# Patient Record
Sex: Male | Born: 1965 | Race: White | Hispanic: No | Smoking: Former smoker
Health system: Southern US, Community
[De-identification: ages and names within clinical notes are randomized; demographics above are authoritative.]

## PROBLEM LIST (undated history)

## (undated) DIAGNOSIS — F32A Depression, unspecified: Secondary | ICD-10-CM

## (undated) DIAGNOSIS — K759 Inflammatory liver disease, unspecified: Secondary | ICD-10-CM

## (undated) DIAGNOSIS — F319 Bipolar disorder, unspecified: Secondary | ICD-10-CM

## (undated) DIAGNOSIS — A4902 Methicillin resistant Staphylococcus aureus infection, unspecified site: Secondary | ICD-10-CM

## (undated) DIAGNOSIS — F329 Major depressive disorder, single episode, unspecified: Secondary | ICD-10-CM

## (undated) DIAGNOSIS — F419 Anxiety disorder, unspecified: Secondary | ICD-10-CM

## (undated) DIAGNOSIS — I10 Essential (primary) hypertension: Secondary | ICD-10-CM

## (undated) HISTORY — DX: Depression, unspecified: F32.A

## (undated) HISTORY — DX: Inflammatory liver disease, unspecified: K75.9

---
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
## 2013-04-22 DIAGNOSIS — F319 Bipolar disorder, unspecified: Secondary | ICD-10-CM

## 2014-03-09 ENCOUNTER — Emergency Department: Payer: Self-pay | Admitting: Internal Medicine

## 2014-03-09 LAB — CBC
HCT: 51.8 % (ref 40.0–52.0)
HGB: 17.2 g/dL (ref 13.0–18.0)
MCH: 29.4 pg (ref 26.0–34.0)
MCHC: 33.2 g/dL (ref 32.0–36.0)
MCV: 89 fL (ref 80–100)
PLATELETS: 218 10*3/uL (ref 150–440)
RBC: 5.84 10*6/uL (ref 4.40–5.90)
RDW: 13.4 % (ref 11.5–14.5)
WBC: 10.2 10*3/uL (ref 3.8–10.6)

## 2014-03-09 LAB — TROPONIN I

## 2014-03-09 LAB — BASIC METABOLIC PANEL
Anion Gap: 8 (ref 7–16)
BUN: 6 mg/dL — AB (ref 7–18)
CO2: 26 mmol/L (ref 21–32)
Calcium, Total: 9.5 mg/dL (ref 8.5–10.1)
Chloride: 103 mmol/L (ref 98–107)
Creatinine: 1.1 mg/dL (ref 0.60–1.30)
EGFR (African American): >60
Glucose: 109 mg/dL — ABNORMAL HIGH (ref 65–99)
Osmolality: 272 (ref 275–301)
Potassium: 3.7 mmol/L (ref 3.5–5.1)
SODIUM: 137 mmol/L (ref 136–145)

## 2014-10-13 ENCOUNTER — Emergency Department: Payer: Self-pay | Admitting: Emergency Medicine

## 2014-10-15 ENCOUNTER — Emergency Department: Payer: Self-pay | Admitting: Emergency Medicine

## 2014-10-15 LAB — CBC
HCT: 42.4 % (ref 40.0–52.0)
HGB: 14.3 g/dL (ref 13.0–18.0)
MCH: 30.3 pg (ref 26.0–34.0)
MCHC: 33.8 g/dL (ref 32.0–36.0)
MCV: 90 fL (ref 80–100)
PLATELETS: 216 10*3/uL (ref 150–440)
RBC: 4.73 10*6/uL (ref 4.40–5.90)
RDW: 13 % (ref 11.5–14.5)
WBC: 6.8 10*3/uL (ref 3.8–10.6)

## 2014-10-15 LAB — COMPREHENSIVE METABOLIC PANEL
ALK PHOS: 69 U/L
ALT: 34 U/L
ANION GAP: 7 (ref 7–16)
Albumin: 3.6 g/dL (ref 3.4–5.0)
BILIRUBIN TOTAL: 0.2 mg/dL (ref 0.2–1.0)
BUN: 16 mg/dL (ref 7–18)
CO2: 23 mmol/L (ref 21–32)
CREATININE: 1.24 mg/dL (ref 0.60–1.30)
Calcium, Total: 8.7 mg/dL (ref 8.5–10.1)
Chloride: 107 mmol/L (ref 98–107)
EGFR (Non-African Amer.): >60
Glucose: 81 mg/dL (ref 65–99)
Osmolality: 274 (ref 275–301)
Potassium: 4.2 mmol/L (ref 3.5–5.1)
SGOT(AST): 28 U/L (ref 15–37)
Sodium: 137 mmol/L (ref 136–145)
TOTAL PROTEIN: 7.2 g/dL (ref 6.4–8.2)

## 2014-10-16 ENCOUNTER — Emergency Department: Payer: Self-pay | Admitting: Emergency Medicine

## 2014-10-16 LAB — COMPREHENSIVE METABOLIC PANEL
ALT: 42 U/L
AST: 36 U/L (ref 15–37)
Albumin: 4.1 g/dL (ref 3.4–5.0)
Alkaline Phosphatase: 75 U/L
Anion Gap: 6 — ABNORMAL LOW (ref 7–16)
BUN: 14 mg/dL (ref 7–18)
Bilirubin,Total: 0.3 mg/dL (ref 0.2–1.0)
CHLORIDE: 105 mmol/L (ref 98–107)
CO2: 27 mmol/L (ref 21–32)
Calcium, Total: 9.6 mg/dL (ref 8.5–10.1)
Creatinine: 1.39 mg/dL — ABNORMAL HIGH (ref 0.60–1.30)
EGFR (Non-African Amer.): 57 — ABNORMAL LOW
GLUCOSE: 72 mg/dL (ref 65–99)
Osmolality: 275 (ref 275–301)
Potassium: 4.2 mmol/L (ref 3.5–5.1)
Sodium: 138 mmol/L (ref 136–145)
Total Protein: 8 g/dL (ref 6.4–8.2)

## 2014-10-16 LAB — CBC
HCT: 48.5 % (ref 40.0–52.0)
HGB: 16 g/dL (ref 13.0–18.0)
MCH: 30.1 pg (ref 26.0–34.0)
MCHC: 33 g/dL (ref 32.0–36.0)
MCV: 91 fL (ref 80–100)
Platelet: 222 10*3/uL (ref 150–440)
RBC: 5.32 10*6/uL (ref 4.40–5.90)
RDW: 13.2 % (ref 11.5–14.5)
WBC: 6.1 10*3/uL (ref 3.8–10.6)

## 2014-10-16 LAB — LITHIUM LEVEL: Lithium: 0.56 mmol/L — ABNORMAL LOW

## 2014-10-19 LAB — WOUND CULTURE

## 2014-10-21 LAB — CULTURE, BLOOD (SINGLE)

## 2014-12-03 ENCOUNTER — Ambulatory Visit: Payer: Self-pay

## 2014-12-09 ENCOUNTER — Ambulatory Visit: Payer: Self-pay

## 2016-01-01 ENCOUNTER — Other Ambulatory Visit: Payer: Self-pay | Admitting: Internal Medicine

## 2016-01-01 DIAGNOSIS — B182 Chronic viral hepatitis C: Secondary | ICD-10-CM

## 2016-01-08 ENCOUNTER — Ambulatory Visit: Payer: Medicaid Other

## 2016-01-13 ENCOUNTER — Ambulatory Visit: Payer: Medicaid Other

## 2016-01-19 ENCOUNTER — Ambulatory Visit
Admission: RE | Admit: 2016-01-19 | Discharge: 2016-01-19 | Disposition: A | Discharge status: Home / Self Care | Payer: Medicaid Other | Source: Ambulatory Visit | Attending: Internal Medicine | Admitting: Internal Medicine

## 2016-01-19 DIAGNOSIS — B182 Chronic viral hepatitis C: Secondary | ICD-10-CM

## 2016-02-05 ENCOUNTER — Emergency Department
Admission: EM | Admit: 2016-02-05 | Discharge: 2016-02-05 | Disposition: A | Discharge status: Home / Self Care | Payer: Medicaid Other | Attending: Student | Admitting: Student

## 2016-02-05 ENCOUNTER — Encounter: Payer: Self-pay | Admitting: *Deleted

## 2016-02-05 ENCOUNTER — Emergency Department: Payer: Medicaid Other

## 2016-02-05 DIAGNOSIS — I10 Essential (primary) hypertension: Secondary | ICD-10-CM | POA: Insufficient documentation

## 2016-02-05 DIAGNOSIS — Z87891 Personal history of nicotine dependence: Secondary | ICD-10-CM | POA: Insufficient documentation

## 2016-02-05 DIAGNOSIS — F319 Bipolar disorder, unspecified: Secondary | ICD-10-CM | POA: Insufficient documentation

## 2016-02-05 DIAGNOSIS — L03116 Cellulitis of left lower limb: Secondary | ICD-10-CM

## 2016-02-05 HISTORY — DX: Essential (primary) hypertension: I10

## 2016-02-05 HISTORY — DX: Bipolar disorder, unspecified: F31.9

## 2016-02-05 MED ORDER — SULFAMETHOXAZOLE-TRIMETHOPRIM 800-160 MG PO TABS
1.0000 | ORAL_TABLET | Freq: Once | ORAL | Status: AC
  Administered 2016-02-05: 1 via ORAL
  Filled 2016-02-05: qty 1

## 2016-02-05 MED ORDER — SULFAMETHOXAZOLE-TRIMETHOPRIM 800-160 MG PO TABS: 1.0000 | ORAL_TABLET | Freq: Two times a day (BID) | ORAL | Status: DC

## 2016-02-05 MED ORDER — HYDROMORPHONE HCL 1 MG/ML IJ SOLN
1.0000 mg | Freq: Once | INTRAMUSCULAR | Status: AC
  Administered 2016-02-05: 1 mg via INTRAMUSCULAR
  Filled 2016-02-05: qty 1

## 2016-02-05 MED ORDER — OXYCODONE-ACETAMINOPHEN 7.5-325 MG PO TABS: 1.0000 | ORAL_TABLET | ORAL | Status: DC | PRN

## 2016-02-05 NOTE — ED Notes (Signed)
Left foot brusied and red, swollen in ankle area

## 2016-02-05 NOTE — ED Provider Notes (Signed)
[facility] Emergency Department Provider Note   ____________________________________________  Time seen: Approximately 10:37 AM  I have reviewed the triage vital signs and the nursing notes.   HISTORY  Chief Complaint Foot Pain    HPI Ernest Romero is a 50 y.o. male patient complaining of lateral foot pain secondary to dropping a cinderblock on his foot 3 days ago. Patient states that he was to ambulate. Patient does increase bruising and redness to the ankle. Patient also stated this increased swelling. No palliative measures taken for this complaint. Patient rates his pain as 8/10. Patient described a pain as sharp.   Past Medical History  Diagnosis Date  . Bipolar 1 disorder (HCC)   . Hypertension     There are no active problems to display for this patient.   History reviewed. No pertinent past surgical history.  Current Outpatient Rx  Name  Route  Sig  Dispense  Refill  . oxyCODONE-acetaminophen (PERCOCET) 7.5-325 MG tablet   Oral   Take 1 tablet by mouth every 4 (four) hours as needed for severe pain.   20 tablet   0   . sulfamethoxazole-trimethoprim (BACTRIM DS,SEPTRA DS) 800-160 MG tablet   Oral   Take 1 tablet by mouth 2 (two) times daily.   20 tablet   0     Allergies Review of patient's allergies indicates no known allergies.  History reviewed. No pertinent family history.  Social History Social History  Substance Use Topics  . Smoking status: Current Every Day Smoker  . Smokeless tobacco: None  . Alcohol Use: None    Review of Systems Constitutional: No fever/chills Eyes: No visual changes. ENT: No sore throat. Cardiovascular: Denies chest pain. Respiratory: Denies shortness of breath. Gastrointestinal: No abdominal pain.  No nausea, no vomiting.  No diarrhea.  No constipation. Genitourinary: Negative for dysuria. Musculoskeletal: Left foot pain Skin: Negative for rash.Redness and swelling  Neurological:  Negative for headaches, focal weakness or numbness. Psychiatric:Bipolar Endocrine:Hypertension ____________________________________________   PHYSICAL EXAM:  VITAL SIGNS: ED Triage Vitals  Enc Vitals Group     BP 02/05/16 1024 121/85 mmHg     Pulse Rate 02/05/16 1024 79     Resp 02/05/16 1024 16     Temp 02/05/16 1024 98.5 F (36.9 C)     Temp Source 02/05/16 1024 Oral     SpO2 02/05/16 1024 99 %     Weight 02/05/16 1024 200 lb (90.719 kg)     Height 02/05/16 1024 5\' 10"  (1.778 m)     Head Cir --      Peak Flow --      Pain Score 02/05/16 1025 8     Pain Loc --      Pain Edu? --      Excl. in GC? --     Constitutional: Alert and oriented. Well appearing and in no acute distress. Eyes: Conjunctivae are normal. PERRL. EOMI. Head: Atraumatic. Nose: No congestion/rhinnorhea. Mouth/Throat: Mucous membranes are moist.  Oropharynx non-erythematous. Neck: No stridor.  No cervical spine tenderness to palpation. Hematological/Lymphatic/Immunilogical: No cervical lymphadenopathy. Cardiovascular: Normal rate, regular rhythm. Grossly normal heart sounds.  Good peripheral circulation. Respiratory: Normal respiratory effort.  No retractions. Lungs CTAB. Gastrointestinal: Soft and nontender. No distention. No abdominal bruits. No CVA tenderness. Musculoskeletal:Lateral edema and erythema to the left foot.  Neurologic:  Normal speech and language. No gross focal neurologic deficits are appreciated. No gait instability. Skin:  Skin is warm, dry and intact. No rash noted.  Ecchymosis and abrasions. Psychiatric: Mood and affect are normal. Speech and behavior are normal.  ____________________________________________   LABS (all labs ordered are listed, but only abnormal results are displayed)  Labs Reviewed - No data to  display ____________________________________________  EKG   ____________________________________________  RADIOLOGY   ____________________________________________   PROCEDURES  Procedure(s) performed: None  Critical Care performed: No  ____________________________________________   INITIAL IMPRESSION / ASSESSMENT AND PLAN / ED COURSE  Pertinent labs & imaging results that were available during my care of the patient were reviewed by me and considered in my medical decision making (see chart for details).  Cellulitis left foot. Contusion left foot Discussed  negative x-ray finding with patient. Patient given discharge Instructions. Patient get a prescription for Bactrim DS and Percocets. Patient advised follow-up family doctor one week. Return by ER if condition worsens. FINAL CLINICAL IMPRESSION(S) / ED DIAGNOSES  Final diagnoses:  Cellulitis of left foot      NEW MEDICATIONS STARTED DURING THIS VISIT:  New Prescriptions   OXYCODONE-ACETAMINOPHEN (PERCOCET) 7.5-325 MG TABLET    Take 1 tablet by mouth every 4 (four) hours as needed for severe pain.   SULFAMETHOXAZOLE-TRIMETHOPRIM (BACTRIM DS,SEPTRA DS) 800-160 MG TABLET    Take 1 tablet by mouth 2 (two) times daily.     Note:  This document was prepared using Dragon voice recognition software and may include unintentional dictation errors.    Joni Reining, PA-C 02/05/16 1201  Gayla Doss, MD 02/05/16 [phone]

## 2016-02-05 NOTE — Discharge Instructions (Signed)
Cellulitis °Cellulitis is an infection of the skin and the tissue under the skin. The infected area is usually red and tender. This happens most often in the arms and lower legs. °HOME CARE  °· Take your antibiotic medicine as told. Finish the medicine even if you start to feel better. °· Keep the infected arm or leg raised (elevated). °· Put a warm cloth on the area up to 4 times per day. °· Only take medicines as told by your doctor. °· Keep all doctor visits as told. °GET HELP IF: °· You see red streaks on the skin coming from the infected area. °· Your red area gets bigger or turns a dark color. °· Your bone or joint under the infected area is painful after the skin heals. °· Your infection comes back in the same area or different area. °· You have a puffy (swollen) bump in the infected area. °· You have new symptoms. °· You have a fever. °GET HELP RIGHT AWAY IF:  °· You feel very sleepy. °· You throw up (vomit) or have watery poop (diarrhea). °· You feel sick and have muscle aches and pains. °  °This information is not intended to replace advice given to you by your health care provider. Make sure you discuss any questions you have with your health care provider. °  °Document Released: 03/07/2008 Document Revised: 06/10/2015 Document Reviewed: 12/05/2011 °Elsevier Interactive Patient Education ©2016 Elsevier Inc. ° °

## 2016-02-05 NOTE — ED Notes (Signed)
Patient to ER for c/o left foot pain after dropping cinder block on foot 3 days ago. States he is able to walk on foot.

## 2016-02-07 ENCOUNTER — Emergency Department
Admission: EM | Admit: 2016-02-07 | Discharge: 2016-02-07 | Disposition: A | Discharge status: Home / Self Care | Payer: Medicaid Other | Attending: Emergency Medicine | Admitting: Emergency Medicine

## 2016-02-07 ENCOUNTER — Emergency Department: Payer: Medicaid Other

## 2016-02-07 ENCOUNTER — Encounter: Payer: Self-pay | Admitting: *Deleted

## 2016-02-07 DIAGNOSIS — I10 Essential (primary) hypertension: Secondary | ICD-10-CM | POA: Insufficient documentation

## 2016-02-07 DIAGNOSIS — L02612 Cutaneous abscess of left foot: Secondary | ICD-10-CM | POA: Diagnosis not present

## 2016-02-07 DIAGNOSIS — L03116 Cellulitis of left lower limb: Secondary | ICD-10-CM

## 2016-02-07 DIAGNOSIS — M79672 Pain in left foot: Secondary | ICD-10-CM | POA: Diagnosis present

## 2016-02-07 DIAGNOSIS — F319 Bipolar disorder, unspecified: Secondary | ICD-10-CM | POA: Insufficient documentation

## 2016-02-07 DIAGNOSIS — Z87891 Personal history of nicotine dependence: Secondary | ICD-10-CM | POA: Diagnosis not present

## 2016-02-07 LAB — BASIC METABOLIC PANEL
Anion gap: 7 (ref 5–15)
BUN: 11 mg/dL (ref 6–20)
CALCIUM: 9.4 mg/dL (ref 8.9–10.3)
CO2: 24 mmol/L (ref 22–32)
Chloride: 106 mmol/L (ref 101–111)
Creatinine, Ser: 1.01 mg/dL (ref 0.61–1.24)
GFR calc Af Amer: >60 mL/min (ref >60)
GLUCOSE: 100 mg/dL — AB (ref 65–99)
Potassium: 4.3 mmol/L (ref 3.5–5.1)
SODIUM: 137 mmol/L (ref 135–145)

## 2016-02-07 LAB — CBC
HCT: 41.3 % (ref 40.0–52.0)
Hemoglobin: 14.2 g/dL (ref 13.0–18.0)
MCH: 30 pg (ref 26.0–34.0)
MCHC: 34.4 g/dL (ref 32.0–36.0)
MCV: 87.2 fL (ref 80.0–100.0)
Platelets: 261 10*3/uL (ref 150–440)
RBC: 4.74 MIL/uL (ref 4.40–5.90)
RDW: 12.7 % (ref 11.5–14.5)
WBC: 8.9 10*3/uL (ref 3.8–10.6)

## 2016-02-07 MED ORDER — LIDOCAINE-EPINEPHRINE (PF) 1 %-1:200000 IJ SOLN
INTRAMUSCULAR | Status: AC
  Filled 2016-02-07: qty 30

## 2016-02-07 MED ORDER — ONDANSETRON 4 MG PO TBDP: 4.0000 mg | ORAL_TABLET | Freq: Three times a day (TID) | ORAL | Status: DC | PRN

## 2016-02-07 MED ORDER — SODIUM CHLORIDE 0.9 % IV BOLUS (SEPSIS)
1000.0000 mL | Freq: Once | INTRAVENOUS | Status: AC
  Administered 2016-02-07: 1000 mL via INTRAVENOUS

## 2016-02-07 MED ORDER — CLINDAMYCIN PHOSPHATE 600 MG/50ML IV SOLN
600.0000 mg | Freq: Once | INTRAVENOUS | Status: AC
  Administered 2016-02-07: 600 mg via INTRAVENOUS
  Filled 2016-02-07 (×2): qty 50

## 2016-02-07 MED ORDER — ACETAMINOPHEN 325 MG PO TABS: 650.0000 mg | ORAL_TABLET | Freq: Four times a day (QID) | ORAL | Status: DC | PRN

## 2016-02-07 MED ORDER — LIDOCAINE-EPINEPHRINE (PF) 1 %-1:200000 IJ SOLN
30.0000 mL | Freq: Once | INTRAMUSCULAR | Status: AC
  Administered 2016-02-07: 30 mL via INTRADERMAL
  Filled 2016-02-07: qty 30

## 2016-02-07 MED ORDER — CLINDAMYCIN HCL 150 MG PO CAPS: 450.0000 mg | ORAL_CAPSULE | Freq: Three times a day (TID) | ORAL | Status: DC

## 2016-02-07 MED ORDER — IBUPROFEN 200 MG PO TABS: 600.0000 mg | ORAL_TABLET | Freq: Four times a day (QID) | ORAL | Status: DC | PRN

## 2016-02-07 NOTE — ED Provider Notes (Signed)
[facility] Emergency Department Provider Note  ____________________________________________  Time seen: 3:15 PM  I have reviewed the triage vital signs and the nursing notes.   HISTORY  Chief Complaint Wound Check    HPI Ernest Romero is a 50 y.o. male who complains of worsening swelling and pain of his left foot. 5 or 6 days ago he dropped a heavy block on the foot, which resulted in redness and swelling of the foot. He was seen in the emergency department 2 days ago diagnosed with cellulitis and started on Bactrim. However, despite taking the antibiotics, the foot has become increasingly swollen and red. He is also noticeably looks like a blister has formed on the medial aspect of the left foot. Denies any other complaints such as leg pain fevers chills abdominal pain and body aches and fatigue dizziness syncope or vomiting. He otherwise feels very well. Denies any history of diabetes.     Past Medical History  Diagnosis Date  . Bipolar 1 disorder (HCC)   . Hypertension      There are no active problems to display for this patient.    History reviewed. No pertinent past surgical history.   Current Outpatient Rx  Name  Route  Sig  Dispense  Refill  . acetaminophen (TYLENOL) 325 MG tablet   Oral   Take 2 tablets (650 mg total) by mouth every 6 (six) hours as needed.   60 tablet   0   . clindamycin (CLEOCIN) 150 MG capsule   Oral   Take 3 capsules (450 mg total) by mouth 3 (three) times daily.   90 capsule   0   . ibuprofen (MOTRIN IB) 200 MG tablet   Oral   Take 3 tablets (600 mg total) by mouth every 6 (six) hours as needed.   60 tablet   0   . ondansetron (ZOFRAN ODT) 4 MG disintegrating tablet   Oral   Take 1 tablet (4 mg total) by mouth every 8 (eight) hours as needed for nausea or vomiting.   20 tablet   0   . oxyCODONE-acetaminophen (PERCOCET) 7.5-325 MG tablet   Oral   Take 1 tablet by mouth every 4 (four) hours as needed  for severe pain.   20 tablet   0   . sulfamethoxazole-trimethoprim (BACTRIM DS,SEPTRA DS) 800-160 MG tablet   Oral   Take 1 tablet by mouth 2 (two) times daily.   20 tablet   0      Allergies Review of patient's allergies indicates no known allergies.   History reviewed. No pertinent family history.  Social History Social History  Substance Use Topics  . Smoking status: Former Games developermoker  . Smokeless tobacco: None  . Alcohol Use: No    Review of Systems  Constitutional:   No fever or chills.  Eyes:   No vision changes.  ENT:   No sore throat. No rhinorrhea. Cardiovascular:   No chest pain. Respiratory:   No dyspnea or cough. Gastrointestinal:   Negative for abdominal pain, vomiting and diarrhea.  Genitourinary:   Negative for dysuria or difficulty urinating. Musculoskeletal:   Worsening left foot swelling redness and pain as above Neurological:   Negative for headaches 10-point ROS otherwise negative.  ____________________________________________   PHYSICAL EXAM:  VITAL SIGNS: ED Triage Vitals  Enc Vitals Group     BP 02/07/16 1338 115/74 mmHg     Pulse Rate 02/07/16 1338 69     Resp 02/07/16 1338 16  Temp 02/07/16 1338 98.5 F (36.9 C)     Temp Source 02/07/16 1338 Oral     SpO2 02/07/16 1338 96 %     Weight 02/07/16 1338 200 lb (90.719 kg)     Height 02/07/16 1338  (1.778 m)     Head Cir --      Peak Flow --      Pain Score 02/07/16 1336 8     Pain Loc --      Pain Edu? --      Excl. in GC? --     Vital signs reviewed, nursing assessments reviewed.   Constitutional:   Alert and oriented. Well appearing and in no distress. Eyes:   No scleral icterus. No conjunctival pallor. PERRL. EOMI.  No nystagmus. ENT   Head:   Normocephalic and atraumatic.   Nose:   No congestion/rhinnorhea. No septal hematoma   Mouth/Throat:   MMM, no pharyngeal erythema. No peritonsillar mass.    Neck:   No stridor. No SubQ emphysema. No  meningismus. Hematological/Lymphatic/Immunilogical:   No cervical lymphadenopathy. Cardiovascular:   RRR. Symmetric bilateral radial and DP pulses.  No murmurs.  Respiratory:   Normal respiratory effort without tachypnea nor retractions. Breath sounds are clear and equal bilaterally. No wheezes/rales/rhonchi. Gastrointestinal:   Soft and nontender. Non distended. There is no CVA tenderness.  No rebound, rigidity, or guarding. Genitourinary:   deferred Musculoskeletal:   The left foot is diffusely erythematous from the base of the toes to almost the ankle. It is not warm or hot to the touch, and not significantly indurated. Over the medial aspect of the forefoot, there is approximately 2 or 3 cm blister that is tense with fluctuant fluid underneath. Surrounding this area there is induration and tenderness of the skin. Neurologic:   Normal speech and language.  CN 2-10 normal. Motor grossly intact. No gross focal neurologic deficits are appreciated.  Skin:    Erythema tenderness and blistering of the medial left forefoot as above  ____________________________________________    LABS (pertinent positives/negatives) (all labs ordered are listed, but only abnormal results are displayed) Labs Reviewed  BASIC METABOLIC PANEL - Abnormal; Notable for the following:    Glucose, Bld 100 (*)    All other components within normal limits  CBC   ____________________________________________   EKG    ____________________________________________    RADIOLOGY  X-ray left foot unremarkable  ____________________________________________   PROCEDURES INCISION AND DRAINAGE Performed by: Scotty Court, Genieve Ramaswamy Consent: Verbal consent obtained. Risks and benefits: risks, benefits and alternatives were discussed Type: abscess  Body area: Left foot  Anesthesia: local infiltration  Incision was made with a scalpel.  Local anesthetic: lidocaine 1% with epinephrine  Anesthetic total: 3  ml  Complexity: complex Blunt dissection to break up loculations  Drainage: purulent  Drainage amount: 5 ML's   Packing material: 1/4 in iodoform gauze  Patient tolerance: Patient tolerated the procedure well with no immediate complications.  devitalized tissue blister roof was debrided, Betadine skin cleansing of the underlying tissue. Irrigation of the abscess cavity after probing and breaking up loculations.    ____________________________________________   INITIAL IMPRESSION / ASSESSMENT AND PLAN / ED COURSE  Pertinent labs & imaging results that were available during my care of the patient were reviewed by me and considered in my medical decision making (see chart for details).  Patient presents with worsening cellulitis of the left foot with an abscess. He does not have diabetes to complicate this or any other reason for  immunosuppression or impaired perfusion and delivery of reactive immune cells and antibiotics.  Labs are unremarkable, x-ray unremarkable. Low suspicion for necrotizing fasciitis lymphangitis osteomyelitis. His comp located with an abscess which is been drained and packed. I did offer the patient admission due to outpatient treatment failure of the cellulitis, but the patient refuses. Additionally, because he was started on Bactrim, his initial antibiotic therapy would not have been active against the most likely culprit which is strep. I'm giving the patient IV saline and IV clindamycin and will continue him on clindamycin at home with close follow-up with primary care, return precautions given to return to the emergency Department if his condition worsens. He is tolerating oral intake, denies any other symptoms or complaints and feels well and wants to go home.     ____________________________________________   FINAL CLINICAL IMPRESSION(S) / ED DIAGNOSES  Final diagnoses:  Cellulitis of left foot  Abscess of left foot excluding toes       Portions of  this note were generated with dragon dictation software. Dictation errors may occur despite best attempts at proofreading.   Sharman Cheek, MD 02/07/16 [phone]

## 2016-02-07 NOTE — ED Notes (Signed)
Pt reports cleaning on Wednesday when a "block" fell on pts foot and a nail punctured pts left foot. Pt was seen in ED Friday and given antibiotics and pain medication. Pt returned today due to increased redness, swelling and a large 1in x 1in abscess that has formed on the top of left foot. Pt is able to move foot at this time. Pedal pulse is intact. Pt denies having fever or sweats at home. Pt reports he is still taking antibiotics prescribed.

## 2016-02-07 NOTE — Discharge Instructions (Signed)
Cellulitis °Cellulitis is an infection of the skin and the tissue beneath it. The infected area is usually red and tender. Cellulitis occurs most often in the arms and lower legs.  °CAUSES  °Cellulitis is caused by bacteria that enter the skin through cracks or cuts in the skin. The most common types of bacteria that cause cellulitis are staphylococci and streptococci. °SIGNS AND SYMPTOMS  °· Redness and warmth. °· Swelling. °· Tenderness or pain. °· Fever. °DIAGNOSIS  °Your health care provider can usually determine what is wrong based on a physical exam. Blood tests may also be done. °TREATMENT  °Treatment usually involves taking an antibiotic medicine. °HOME CARE INSTRUCTIONS  °· Take your antibiotic medicine as directed by your health care provider. Finish the antibiotic even if you start to feel better. °· Keep the infected arm or leg elevated to reduce swelling. °· Apply a warm cloth to the affected area up to 4 times per day to relieve pain. °· Take medicines only as directed by your health care provider. °· Keep all follow-up visits as directed by your health care provider. °SEEK MEDICAL CARE IF:  °· You notice red streaks coming from the infected area. °· Your red area gets larger or turns dark in color. °· Your bone or joint underneath the infected area becomes painful after the skin has healed. °· Your infection returns in the same area or another area. °· You notice a swollen bump in the infected area. °· You develop new symptoms. °· You have a fever. °SEEK IMMEDIATE MEDICAL CARE IF:  °· You feel very sleepy. °· You develop vomiting or diarrhea. °· You have a general ill feeling (malaise) with muscle aches and pains. °  °This information is not intended to replace advice given to you by your health care provider. Make sure you discuss any questions you have with your health care provider. °  °Document Released: 06/29/2005 Document Revised: 06/10/2015 Document Reviewed: 12/05/2011 °Elsevier Interactive  Patient Education ©2016 Elsevier Inc. ° °Abscess °An abscess is an infected area that contains a collection of pus and debris. It can occur in almost any part of the body. An abscess is also known as a furuncle or boil. °CAUSES  °An abscess occurs when tissue gets infected. This can occur from blockage of oil or sweat glands, infection of hair follicles, or a minor injury to the skin. As the body tries to fight the infection, pus collects in the area and creates pressure under the skin. This pressure causes pain. People with weakened immune systems have difficulty fighting infections and get certain abscesses more often.  °SYMPTOMS °Usually an abscess develops on the skin and becomes a painful mass that is red, warm, and tender. If the abscess forms under the skin, you may feel a moveable soft area under the skin. Some abscesses break open (rupture) on their own, but most will continue to get worse without care. The infection can spread deeper into the body and eventually into the bloodstream, causing you to feel ill.  °DIAGNOSIS  °Your caregiver will take your medical history and perform a physical exam. A sample of fluid may also be taken from the abscess to determine what is causing your infection. °TREATMENT  °Your caregiver may prescribe antibiotic medicines to fight the infection. However, taking antibiotics alone usually does not cure an abscess. Your caregiver may need to make a small cut (incision) in the abscess to drain the pus. In some cases, gauze is packed into the abscess to reduce   pain and to continue draining the area. °HOME CARE INSTRUCTIONS  °· Only take over-the-counter or prescription medicines for pain, discomfort, or fever as directed by your caregiver. °· If you were prescribed antibiotics, take them as directed. Finish them even if you start to feel better. °· If gauze is used, follow your caregiver's directions for changing the gauze. °· To avoid spreading the infection: °· Keep your  draining abscess covered with a bandage. °· Wash your hands well. °· Do not share personal care items, towels, or whirlpools with others. °· Avoid skin contact with others. °· Keep your skin and clothes clean around the abscess. °· Keep all follow-up appointments as directed by your caregiver. °SEEK MEDICAL CARE IF:  °· You have increased pain, swelling, redness, fluid drainage, or bleeding. °· You have muscle aches, chills, or a general ill feeling. °· You have a fever. °MAKE SURE YOU:  °· Understand these instructions. °· Will watch your condition. °· Will get help right away if you are not doing well or get worse. °  °This information is not intended to replace advice given to you by your health care provider. Make sure you discuss any questions you have with your health care provider. °  °Document Released: 06/29/2005 Document Revised: 03/20/2012 Document Reviewed: 12/02/2011 °Elsevier Interactive Patient Education ©2016 Elsevier Inc. °Incision and Drainage °Incision and drainage is a procedure in which a sac-like structure (cystic structure) is opened and drained. The area to be drained usually contains material such as pus, fluid, or blood.  °LET YOUR CAREGIVER KNOW ABOUT:  °· Allergies to medicine. °· Medicines taken, including vitamins, herbs, eyedrops, over-the-counter medicines, and creams. °· Use of steroids (by mouth or creams). °· Previous problems with anesthetics or numbing medicines. °· History of bleeding problems or blood clots. °· Previous surgery. °· Other health problems, including diabetes and kidney problems. °· Possibility of pregnancy, if this applies. °RISKS AND COMPLICATIONS °· Pain. °· Bleeding. °· Scarring. °· Infection. °BEFORE THE PROCEDURE  °You may need to have an ultrasound or other imaging tests to see how large or deep your cystic structure is. Blood tests may also be used to determine if you have an infection or how severe the infection is. You may need to have a tetanus  shot. °PROCEDURE  °The affected area is cleaned with a cleaning fluid. The cyst area will then be numbed with a medicine (local anesthetic). A small incision will be made in the cystic structure. A syringe or catheter may be used to drain the contents of the cystic structure, or the contents may be squeezed out. The area will then be flushed with a cleansing solution. After cleansing the area, it is often gently packed with a gauze or another wound dressing. Once it is packed, it will be covered with gauze and tape or some other type of wound dressing.  °AFTER THE PROCEDURE  °· Often, you will be allowed to go home right after the procedure. °· You may be given antibiotic medicine to prevent or heal an infection. °· If the area was packed with gauze or some other wound dressing, you will likely need to come back in 1 to 2 days to get it removed. °· The area should heal in about 14 days. °  °This information is not intended to replace advice given to you by your health care provider. Make sure you discuss any questions you have with your health care provider. °  °Document Released: 03/15/2001 Document Revised: 03/20/2012 Document Reviewed:   Reviewed: 11/14/2011 Elsevier Interactive Patient Education [employer] Inc[employer]

## 2016-05-29 ENCOUNTER — Inpatient Hospital Stay
Admission: EM | Admit: 2016-05-29 | Discharge: 2016-06-01 | DRG: 896 | Disposition: A | Discharge status: Home / Self Care | Payer: Medicaid Other | Attending: Specialist | Admitting: Specialist

## 2016-05-29 ENCOUNTER — Emergency Department: Payer: Medicaid Other

## 2016-05-29 ENCOUNTER — Encounter: Payer: Self-pay | Admitting: Emergency Medicine

## 2016-05-29 DIAGNOSIS — F17200 Nicotine dependence, unspecified, uncomplicated: Secondary | ICD-10-CM | POA: Diagnosis present

## 2016-05-29 DIAGNOSIS — G9340 Encephalopathy, unspecified: Secondary | ICD-10-CM | POA: Diagnosis present

## 2016-05-29 DIAGNOSIS — F1123 Opioid dependence with withdrawal: Secondary | ICD-10-CM | POA: Diagnosis present

## 2016-05-29 DIAGNOSIS — F13939 Sedative, hypnotic or anxiolytic use, unspecified with withdrawal, unspecified: Secondary | ICD-10-CM

## 2016-05-29 DIAGNOSIS — Z8249 Family history of ischemic heart disease and other diseases of the circulatory system: Secondary | ICD-10-CM

## 2016-05-29 DIAGNOSIS — F1110 Opioid abuse, uncomplicated: Secondary | ICD-10-CM

## 2016-05-29 DIAGNOSIS — I10 Essential (primary) hypertension: Secondary | ICD-10-CM | POA: Diagnosis present

## 2016-05-29 DIAGNOSIS — R410 Disorientation, unspecified: Secondary | ICD-10-CM | POA: Diagnosis present

## 2016-05-29 DIAGNOSIS — F319 Bipolar disorder, unspecified: Secondary | ICD-10-CM

## 2016-05-29 DIAGNOSIS — R000 Tachycardia, unspecified: Secondary | ICD-10-CM

## 2016-05-29 DIAGNOSIS — F13239 Sedative, hypnotic or anxiolytic dependence with withdrawal, unspecified: Principal | ICD-10-CM | POA: Diagnosis present

## 2016-05-29 DIAGNOSIS — Z79899 Other long term (current) drug therapy: Secondary | ICD-10-CM | POA: Diagnosis not present

## 2016-05-29 DIAGNOSIS — F1193 Opioid use, unspecified with withdrawal: Secondary | ICD-10-CM

## 2016-05-29 LAB — COMPREHENSIVE METABOLIC PANEL
ALT: 68 U/L — AB (ref 17–63)
AST: 36 U/L (ref 15–41)
Albumin: 4.5 g/dL (ref 3.5–5.0)
Alkaline Phosphatase: 64 U/L (ref 38–126)
Anion gap: 10 (ref 5–15)
BUN: 27 mg/dL — ABNORMAL HIGH (ref 6–20)
CHLORIDE: 111 mmol/L (ref 101–111)
CO2: 22 mmol/L (ref 22–32)
CREATININE: 0.92 mg/dL (ref 0.61–1.24)
Calcium: 10.2 mg/dL (ref 8.9–10.3)
GFR calc non Af Amer: >60 mL/min (ref >60)
Glucose, Bld: 130 mg/dL — ABNORMAL HIGH (ref 65–99)
POTASSIUM: 4.3 mmol/L (ref 3.5–5.1)
Sodium: 143 mmol/L (ref 135–145)
Total Bilirubin: 1.4 mg/dL — ABNORMAL HIGH (ref 0.3–1.2)
Total Protein: 8.4 g/dL — ABNORMAL HIGH (ref 6.5–8.1)

## 2016-05-29 LAB — CK: CK TOTAL: 23 U/L — AB (ref 49–397)

## 2016-05-29 LAB — URINE DRUG SCREEN, QUALITATIVE (ARMC ONLY)
AMPHETAMINES, UR SCREEN: NOT DETECTED
BENZODIAZEPINE, UR SCRN: NOT DETECTED
Barbiturates, Ur Screen: NOT DETECTED
Cannabinoid 50 Ng, Ur ~~LOC~~: NOT DETECTED
Cocaine Metabolite,Ur ~~LOC~~: NOT DETECTED
MDMA (Ecstasy)Ur Screen: NOT DETECTED
METHADONE SCREEN, URINE: NOT DETECTED
Opiate, Ur Screen: NOT DETECTED
Phencyclidine (PCP) Ur S: NOT DETECTED
Tricyclic, Ur Screen: NOT DETECTED

## 2016-05-29 LAB — CBC WITH DIFFERENTIAL/PLATELET
BASOS ABS: 0.1 10*3/uL (ref 0–0.1)
Basophils Relative: 1 %
Eosinophils Absolute: 0 10*3/uL (ref 0–0.7)
Eosinophils Relative: 0 %
HEMATOCRIT: 53.6 % — AB (ref 40.0–52.0)
Hemoglobin: 18.6 g/dL — ABNORMAL HIGH (ref 13.0–18.0)
LYMPHS PCT: 20 %
Lymphs Abs: 2.6 10*3/uL (ref 1.0–3.6)
MCH: 30.2 pg (ref 26.0–34.0)
MCHC: 34.8 g/dL (ref 32.0–36.0)
MCV: 86.9 fL (ref 80.0–100.0)
Monocytes Absolute: 1 10*3/uL (ref 0.2–1.0)
Monocytes Relative: 8 %
NEUTROS ABS: 9.6 10*3/uL — AB (ref 1.4–6.5)
Neutrophils Relative %: 71 %
PLATELETS: 251 10*3/uL (ref 150–440)
RBC: 6.17 MIL/uL — AB (ref 4.40–5.90)
RDW: 12.9 % (ref 11.5–14.5)
WBC: 13.3 10*3/uL — AB (ref 3.8–10.6)

## 2016-05-29 LAB — MRSA PCR SCREENING: MRSA by PCR: NEGATIVE

## 2016-05-29 LAB — SALICYLATE LEVEL

## 2016-05-29 LAB — ACETAMINOPHEN LEVEL

## 2016-05-29 LAB — TROPONIN I

## 2016-05-29 LAB — AMMONIA: Ammonia: 12 umol/L (ref 9–35)

## 2016-05-29 LAB — LITHIUM LEVEL: LITHIUM LVL: 0.52 mmol/L — AB (ref 0.60–1.20)

## 2016-05-29 MED ORDER — SODIUM CHLORIDE 0.9 % IV BOLUS (SEPSIS)
1000.0000 mL | Freq: Once | INTRAVENOUS | Status: AC
  Administered 2016-05-29: 1000 mL via INTRAVENOUS

## 2016-05-29 MED ORDER — LABETALOL HCL 5 MG/ML IV SOLN
10.0000 mg | INTRAVENOUS | Status: DC | PRN
  Administered 2016-05-30 – 2016-05-31 (×2): 10 mg via INTRAVENOUS
  Filled 2016-05-29 (×2): qty 4

## 2016-05-29 MED ORDER — ONDANSETRON HCL 4 MG/2ML IJ SOLN: 4.0000 mg | Freq: Four times a day (QID) | INTRAMUSCULAR | Status: DC | PRN

## 2016-05-29 MED ORDER — LORAZEPAM 1 MG PO TABS
1.0000 mg | ORAL_TABLET | Freq: Four times a day (QID) | ORAL | Status: DC | PRN
  Administered 2016-05-30 – 2016-05-31 (×2): 1 mg via ORAL
  Filled 2016-05-29 (×2): qty 1

## 2016-05-29 MED ORDER — ONDANSETRON HCL 4 MG PO TABS: 4.0000 mg | ORAL_TABLET | Freq: Four times a day (QID) | ORAL | Status: DC | PRN

## 2016-05-29 MED ORDER — FOLIC ACID 1 MG PO TABS
1.0000 mg | ORAL_TABLET | Freq: Once | ORAL | Status: AC
  Administered 2016-05-29: 1 mg via ORAL
  Filled 2016-05-29: qty 1

## 2016-05-29 MED ORDER — LORAZEPAM 2 MG/ML IJ SOLN
1.0000 mg | Freq: Once | INTRAMUSCULAR | Status: AC
  Administered 2016-05-29: 1 mg via INTRAVENOUS

## 2016-05-29 MED ORDER — ENOXAPARIN SODIUM 40 MG/0.4ML ~~LOC~~ SOLN
40.0000 mg | SUBCUTANEOUS | Status: DC
  Administered 2016-05-29 – 2016-05-31 (×3): 40 mg via SUBCUTANEOUS
  Filled 2016-05-29 (×3): qty 0.4

## 2016-05-29 MED ORDER — THIAMINE HCL 100 MG/ML IJ SOLN
100.0000 mg | Freq: Once | INTRAMUSCULAR | Status: AC
Start: 2016-05-29 — End: 2016-05-29
  Administered 2016-05-29: 100 mg via INTRAVENOUS
  Filled 2016-05-29: qty 2

## 2016-05-29 MED ORDER — THIAMINE HCL 100 MG/ML IJ SOLN
Freq: Once | INTRAVENOUS | Status: AC
  Administered 2016-05-30: 08:00:00 via INTRAVENOUS
  Filled 2016-05-29: qty 1000

## 2016-05-29 MED ORDER — LORAZEPAM 2 MG/ML IJ SOLN
2.0000 mg | INTRAMUSCULAR | Status: DC | PRN
  Administered 2016-05-29: 1 mg via INTRAVENOUS
  Administered 2016-05-30 (×2): 2 mg via INTRAVENOUS
  Filled 2016-05-29 (×3): qty 1

## 2016-05-29 MED ORDER — LORAZEPAM 2 MG/ML IJ SOLN
1.0000 mg | Freq: Once | INTRAMUSCULAR | Status: AC
  Administered 2016-05-29: 1 mg via INTRAVENOUS
  Filled 2016-05-29: qty 1

## 2016-05-29 NOTE — ED Notes (Signed)
Pt talking, not making any sense. Drainage from both eyes. Pt pulling off cardiac leads and pulse ox monitor.

## 2016-05-29 NOTE — H&P (Signed)
[facility]   PATIENT NAME: Ernest LevelsJohn Romero    MR#:  [id number]  DATE OF BIRTH:  01/25/1966  DATE OF ADMISSION:  05/29/2016  PRIMARY CARE PHYSICIAN: Pamala DuffelGREENBLATT, LAWRENCE H, MD   REQUESTING/REFERRING PHYSICIAN: Dr. Roxan Hockeyobinson  CHIEF COMPLAINT:  Altered mental status  HISTORY OF PRESENT ILLNESS:  Ernest Romero  is a 50 y.o. male with a known history of Essential hypertension, bipolar disorder is brought into the ED from today as he was babbling, sweating and not making any sense. Patient has been not eating or drinking much for the past 3 days. Patient was started on new psychiatry medications 3 days ago following that patient was found to be   altered. Patient was drinking alcohol prior to his admission to the jail 15 days ago.According to security officers patient  was on day 11 from withdrawing from Suboxone .CT head is negative in the ED. Patient is still  Confused during my exam. According to the ED physician and patient is responding to Ativan. He is tachycardic but heart rate trended down with IV fluids   PAST MEDICAL HISTORY:   Past Medical History:  Diagnosis Date  . Bipolar 1 disorder (HCC)   . Hypertension     PAST SURGICAL HISTOIRY:  Unable to obtain any past surgical history   SOCIAL HISTORY:   Social History  Substance Use Topics  . Smoking status: Current Every Day Smoker  . Smokeless tobacco: Never Used  . Alcohol use Yes     Comment: unknown amount or last drink    FAMILY HISTORY:  Unable to obtain any family history as patient is encephalopathic   DRUG ALLERGIES:  No Known Allergies  REVIEW OF SYSTEMS:  Unobtainable as the patient is encephalopathic  MEDICATIONS AT HOME:   Prior to Admission medications   Medication Sig Start Date End Date Taking? Authorizing Provider  amLODipine (NORVASC) 5 MG tablet Take 5 mg by mouth daily. 05/09/16  Yes Historical Provider, MD  LATUDA 40 MG TABS tablet Take 40 mg by mouth  daily. 05/09/16  Yes Historical Provider, MD  lithium carbonate (ESKALITH) 450 MG CR tablet Take 450 mg by mouth 2 (two) times daily. 05/09/16  Yes Historical Provider, MD  risperiDONE (RISPERDAL) 3 MG tablet Take 3 mg by mouth 2 (two) times daily.    Yes Historical Provider, MD  SUBOXONE 8-2 MG FILM Place 1.5 strips under the tongue daily. 04/28/16  Yes Historical Provider, MD  acetaminophen (TYLENOL) 325 MG tablet Take 2 tablets (650 mg total) by mouth every 6 (six) hours as needed. Patient not taking: Reported on 05/29/2016 02/07/16   Sharman CheekPhillip Stafford, MD  clindamycin (CLEOCIN) 150 MG capsule Take 3 capsules (450 mg total) by mouth 3 (three) times daily. Patient not taking: Reported on 05/29/2016 02/07/16   Sharman CheekPhillip Stafford, MD  ibuprofen (MOTRIN IB) 200 MG tablet Take 3 tablets (600 mg total) by mouth every 6 (six) hours as needed. Patient not taking: Reported on 05/29/2016 02/07/16   Sharman CheekPhillip Stafford, MD  ondansetron (ZOFRAN ODT) 4 MG disintegrating tablet Take 1 tablet (4 mg total) by mouth every 8 (eight) hours as needed for nausea or vomiting. Patient not taking: Reported on 05/29/2016 02/07/16   Sharman CheekPhillip Stafford, MD  oxyCODONE-acetaminophen (PERCOCET) 7.5-325 MG tablet Take 1 tablet by mouth every 4 (four) hours as needed for severe pain. Patient not taking: Reported on 05/29/2016 02/05/16   Joni Reiningonald K Smith, PA-C  sulfamethoxazole-trimethoprim (BACTRIM DS,SEPTRA DS) 800-160 MG tablet  Take 1 tablet by mouth 2 (two) times daily. Patient not taking: Reported on 05/29/2016 02/05/16   Joni Reining, PA-C      VITAL SIGNS:  Blood pressure (!) 150/104, pulse (!) 118, temperature 97.4 F (36.3 C), temperature source Oral, resp. rate (!) 30, height 6' (1.829 m), weight 90.7 kg (200 lb), SpO2 96 %.  PHYSICAL EXAMINATION:  GENERAL:  50 y.o.-year-old patient lying in the bed with no acute distress.  EYES: Pupils equal, round,And sluggishly reactive to light and accommodation. No scleral icterus.  HEENT: Head  atraumatic, normocephalic. Oropharynx and nasopharynx clear.  NECK:  Supple, no jugular venous distention. No thyroid enlargement, no tenderness.  LUNGS: Normal breath sounds bilaterally, no wheezing, rales,rhonchi or crepitation. No use of accessory muscles of respiration.  CARDIOVASCULAR: S1, S2 normal. No murmurs, rubs, or gallops.  ABDOMEN: Soft, nontender, nondistended. Bowel sounds present. No organomegaly or mass.  EXTREMITIES: No pedal edema, cyanosis, or clubbing.   NEUROLOGIC:  Disoriented to time, place and person. Opens his eyes but not following any verbal commands. Babbling and jittery PSYCHIATRIC: The patient is alert and disoriented SKIN: No obvious rash, lesion, or ulcer.   LABORATORY PANEL:   CBC  Recent Labs Lab 05/29/16 1423  WBC 13.3*  HGB 18.6*  HCT 53.6*  PLT 251   ------------------------------------------------------------------------------------------------------------------  Chemistries   Recent Labs Lab 05/29/16 1423  NA 143  K 4.3  CL 111  CO2 22  GLUCOSE 130*  BUN 27*  CREATININE 0.92  CALCIUM 10.2  AST 36  ALT 68*  ALKPHOS 64  BILITOT 1.4*   ------------------------------------------------------------------------------------------------------------------  Cardiac Enzymes  Recent Labs Lab 05/29/16 1423  TROPONINI <0.03   ------------------------------------------------------------------------------------------------------------------  RADIOLOGY:  Dg Chest 1 View  Result Date: 05/29/2016 CLINICAL DATA:  Altered mental status. EXAM: CHEST 1 VIEW COMPARISON:  None. FINDINGS: The heart size and mediastinal contours are within normal limits. Both lungs are clear. The visualized skeletal structures are unremarkable. IMPRESSION: No active disease. Electronically Signed   By: Signa Kell M.D.   On: 05/29/2016 15:11   Ct Head Wo Contrast  Result Date: 05/29/2016 CLINICAL DATA:  Altered mental status.  Withdrawal symptoms EXAM: CT  HEAD WITHOUT CONTRAST TECHNIQUE: Contiguous axial images were obtained from the base of the skull through the vertex without intravenous contrast. COMPARISON:  None. FINDINGS: Brain: Motion degradation of exam.  Repeat exam with improvement No acute intracranial hemorrhage. No focal mass lesion. No CT evidence of acute infarction. No midline shift or mass effect. No hydrocephalus. Basilar cisterns are patent. Vascular: Unremarkable Skull: No fracture Sinuses/Orbits: Paranasal sinuses and mastoid air cells are clear. Orbits are clear. Other: None IMPRESSION: No intracranial abnormality. Electronically Signed   By: Genevive Bi M.D.   On: 05/29/2016 15:03    EKG:   Orders placed or performed during the hospital encounter of 05/29/16  . ED EKG  . ED EKG    IMPRESSION AND PLAN:  Leah Skora  is a 50 y.o. male with a known history of Essential hypertension, bipolar disorder is brought into the ED from today as he was babbling, sweating and not making any sense. Patient has been not eating or drinking much for the past 3 days. Patient was started on new psychiatry medications 3 days ago following that patient was found to be   altered. Patient was drinking alcohol prior to his admission to the jail 15 days ago.According to security officers patient  was on day 11 from withdrawing from Suboxone .CT head  is negative in the ED. Patient is still seemed to be confused.  # Acute encephalopathy could be from withdrawal/ from new psychiatry medicines  CT head is negative Keep him nothing by mouth and provide IV fluids Neuro checks every 4 hours Ativan IV as needed as patient is responding  Neurology and psychiatry consult is placed Urine drug screen is negative Aspiration and fall precautions Seizure precautions  #Sinus tachycardia could be from dehydration from poor by mouth intake Provide IV fluids Patient is afebrile. Will get blood cultures and urine cultures if patient is febrile Chest x-rays  negative  #History of bipolar disorder New psych medications were started 3 days ago according to the staff, will discontinue them Psychiatry consult is placed  #History of alcohol abuse CIWA protocol IV Ativan as needed basis Provide IV fluids with thiamine and folic acid Check ammonia Romero  #Essential hypertension Hold amlodipine as currently patient is nothing by mouth Provide IV antihypertensives as needed basis  DVT prophylaxis with Lovenox subcutaneous   All the records are reviewed and case discussed with ED provider. Management plans discussed with the jail staff they are in agreement.will try to reach family  CODE STATUS: fc  TOTAL TIME TAKING CARE OF THIS PATIENT: 45 minutes.   Note: This dictation was prepared with Dragon dictation along with smaller phrase technology. Any transcriptional errors that result from this process are unintentional.  Ramonita Lab M.D on 05/29/2016 at 5:14 PM  Between 7am to 6pm - Pager - [phone]  After 6pm go to [email or web address] - password [email or web address] [facility]  [facility]  Office  [phone]  CC: Primary care physician; Pamala Duffel, MD

## 2016-05-29 NOTE — ED Provider Notes (Signed)
[facility] Emergency Department Provider Note    First MD Initiated Contact with Patient 05/29/16 1405     (approximate)  I have reviewed the triage vital signs and the nursing notes.   HISTORY  Chief Complaint Altered Mental Status    HPI Ernest Romero is a 50 y.o. male with history of bipolar disorder presenting from jail due to altered mental status and concern to be withdrawing from Suboxone and alcohol. Patient has not been eating or drinking for the past 3 days. Reportedly had a significant alcohol abuse history. Unable to provide date of last drink of alcohol. Reportedly the patient woke up this morning was babbling and speaking incomprehensible words. Patient will not open his eyes. There is no report of any trauma. He's been on lithium and has not been taking this medication for the past 3 days.   Past Medical History:  Diagnosis Date  . Bipolar 1 disorder ([place])   . Hypertension     There are no active problems to display for this patient.   No recent surgeries  Prior to Admission medications   Medication Sig Start Date End Date Taking? Authorizing Provider  acetaminophen (TYLENOL) 325 MG tablet Take 2 tablets (650 mg total) by mouth every 6 (six) hours as needed. 02/07/16   Carrie Mew, MD  clindamycin (CLEOCIN) 150 MG capsule Take 3 capsules (450 mg total) by mouth 3 (three) times daily. 02/07/16   Carrie Mew, MD  ibuprofen (MOTRIN IB) 200 MG tablet Take 3 tablets (600 mg total) by mouth every 6 (six) hours as needed. 02/07/16   Carrie Mew, MD  ondansetron (ZOFRAN ODT) 4 MG disintegrating tablet Take 1 tablet (4 mg total) by mouth every 8 (eight) hours as needed for nausea or vomiting. 02/07/16   Carrie Mew, MD  oxyCODONE-acetaminophen (PERCOCET) 7.5-325 MG tablet Take 1 tablet by mouth every 4 (four) hours as needed for severe pain. 02/05/16   Sable Feil, PA-C  sulfamethoxazole-trimethoprim (BACTRIM DS,SEPTRA DS) 800-160  MG tablet Take 1 tablet by mouth 2 (two) times daily. 02/05/16   Sable Feil, PA-C    Allergies Review of patient's allergies indicates no known allergies.    Social History Social History  Substance Use Topics  . Smoking status: Current Every Day Smoker  . Smokeless tobacco: Never Used  . Alcohol use Yes     Comment: unknown amount or last drink    Review of Systems Patient denies headaches, rhinorrhea, blurry vision, numbness, shortness of breath, chest pain, edema, cough, abdominal pain, nausea, vomiting, diarrhea, dysuria, fevers, rashes or hallucinations unless otherwise stated above in HPI. ____________________________________________   PHYSICAL EXAM:  VITAL SIGNS: Vitals:   05/29/16 1355 05/29/16 1512  BP: (!) 144/108   Pulse: (!) 138   Resp: 18   Temp:  97.4 F (36.3 C)    Constitutional: Alert and oriented. Well appearing and in no acute distress. Eyes: Conjunctivae are normal. PERRL. EOMI. Head: Atraumatic. Nose: No congestion/rhinnorhea. Mouth/Throat: Mucous membranes are moist.  Oropharynx non-erythematous. Neck: No stridor. Painless ROM. No cervical spine tenderness to palpation Hematological/Lymphatic/Immunilogical: No cervical lymphadenopathy. Cardiovascular: Normal rate, regular rhythm. Grossly normal heart sounds.  Good peripheral circulation. Respiratory: Normal respiratory effort.  No retractions. Lungs CTAB. Gastrointestinal: Soft and nontender. No distention. No abdominal bruits. No CVA tenderness. Genitourinary:  Musculoskeletal: No lower extremity tenderness nor edema.  No joint effusions. Neurologic:  Normal speech and language. No gross focal neurologic deficits are appreciated. No gait instability. Skin:  Skin is warm, dry and intact. No rash noted. Psychiatric: Mood and affect are normal. Speech and behavior are normal.  ____________________________________________   LABS (all labs ordered are listed, but only abnormal results are  displayed)  Results for orders placed or performed during the hospital encounter of 05/29/16 (from the past 24 hour(s))  CBC with Differential/Platelet     Status: Abnormal   Collection Time: 05/29/16  2:23 PM  Result Value Ref Range   WBC 13.3 (H) 3.8 - 10.6 K/uL   RBC 6.17 (H) 4.40 - 5.90 MIL/uL   Hemoglobin 18.6 (H) 13.0 - 18.0 g/dL   HCT 53.6 (H) 40.0 - 52.0 %   MCV 86.9 80.0 - 100.0 fL   MCH 30.2 26.0 - 34.0 pg   MCHC 34.8 32.0 - 36.0 g/dL   RDW 12.9 11.5 - 14.5 %   Platelets 251 150 - 440 K/uL   Neutrophils Relative % 71 %   Neutro Abs 9.6 (H) 1.4 - 6.5 K/uL   Lymphocytes Relative 20 %   Lymphs Abs 2.6 1.0 - 3.6 K/uL   Monocytes Relative 8 %   Monocytes Absolute 1.0 0.2 - 1.0 K/uL   Eosinophils Relative 0 %   Eosinophils Absolute 0.0 0 - 0.7 K/uL   Basophils Relative 1 %   Basophils Absolute 0.1 0 - 0.1 K/uL  Comprehensive metabolic panel     Status: Abnormal   Collection Time: 05/29/16  2:23 PM  Result Value Ref Range   Sodium 143 135 - 145 mmol/L   Potassium 4.3 3.5 - 5.1 mmol/L   Chloride 111 101 - 111 mmol/L   CO2 22 22 - 32 mmol/L   Glucose, Bld 130 (H) 65 - 99 mg/dL   BUN 27 (H) 6 - 20 mg/dL   Creatinine, Ser 0.92 0.61 - 1.24 mg/dL   Calcium 10.2 8.9 - 10.3 mg/dL   Total Protein 8.4 (H) 6.5 - 8.1 g/dL   Albumin 4.5 3.5 - 5.0 g/dL   AST 36 15 - 41 U/L   ALT 68 (H) 17 - 63 U/L   Alkaline Phosphatase 64 38 - 126 U/L   Total Bilirubin 1.4 (H) 0.3 - 1.2 mg/dL   GFR calc non Af Amer >60 >60 mL/min   GFR calc Af Amer >60 >60 mL/min   Anion gap 10 5 - 15  CK     Status: Abnormal   Collection Time: 05/29/16  2:23 PM  Result Value Ref Range   Total CK 23 (L) 49 - 397 U/L  Troponin I     Status: None   Collection Time: 05/29/16  2:23 PM  Result Value Ref Range   Troponin I <0.03 <0.03 ng/mL   ____________________________________________  EKG My review and personal interpretation at Time: 14:32   Indication: tachycardia  Rate: 120  Rhythm: sinus Axis:  normal Other: nonspecific ST changes ____________________________________________  RADIOLOGY  CT ehad with NAICA CXR my read shows no evidence of acute cardiopulmonary process.  ____________________________________________   PROCEDURES  Procedure(s) performed: none    Critical Care performed: yes CRITICAL CARE Performed by: Merlyn Lot   Total critical care time: 35 minutes  Critical care time was exclusive of separately billable procedures and treating other patients.  Critical care was necessary to treat or prevent imminent or life-threatening deterioration.  Critical care was time spent personally by me on the following activities: development of treatment plan with patient and/or surrogate as well as nursing, discussions with consultants, evaluation of patient's response  to treatment, examination of patient, obtaining history from patient or surrogate, ordering and performing treatments and interventions, ordering and review of laboratory studies, ordering and review of radiographic studies, pulse oximetry and re-evaluation of patient's condition.  ____________________________________________   INITIAL IMPRESSION / ASSESSMENT AND PLAN / ED COURSE  Pertinent labs & imaging results that were available during my care of the patient were reviewed by me and considered in my medical decision making (see chart for details).  DDX: sah, sdh, withdrawal, meningitis, encephalitis, dehydration  Ernest Romero is a 50 y.o. who presents to the ED with tachycardia and acute encephalopathy from jail. History from patient limited due to acute encephalopathy. Patient does appear acutely ill but is protecting his airway is without any hypoxia. Patient does appear  Clinical Course  Value Comment By Time   Patient reassessed and has no neck rigidity. Denies. He is still acute encephalopathic with altered speech. Merlyn Lot, MD 08/27 1459  WBC: (!) 13.3 (Reviewed) Merlyn Lot, MD 08/27 1516  BUN: (!) 27 (Reviewed) Merlyn Lot, MD 08/27 1516  EGFR (Non-African Amer.): >60 (Reviewed) Merlyn Lot, MD 08/27 1516  Total Bilirubin: (!) 1.4 (Reviewed) Merlyn Lot, MD 08/27 1516   CT head with NAICA.  CXR my read shows no evidence of acute cardiopulmonary process. Patient with improvement in mental status s/p ativan administration.  I suspect acute withdrawal. Merlyn Lot, MD 08/27 1516   ----------------------------------------- 4:19 PM on 05/29/2016 -----------------------------------------  Patient with persistent tachycardia that is improving with IV fluid resuscitation and Ativan. Still awaiting UDS and lithium levels.  Based on his presentation response to fluids as well as Ativan I am still suspecting alcohol withdrawal. Suboxone withdrawal I would not expect present with the same encephalopathy. Do not feel this is clinically consistent with meningitis or encephalitis. Patient has no evidence of pneumonia. Abdominal exam soft and benign. Based on his persistent tachycardia and encephalopathy patient will require admission to hospital for further evaluation and management. Spoke with Dr. Margaretmary Eddy regarding the patient's presentation and she currently agrees to admit for further evaluation.  Have discussed with the patient and available family all diagnostics and treatments performed thus far and all questions were answered to the best of my ability. The patient demonstrates understanding and agreement with plan.   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    Merlyn Lot, MD 05/29/16 [phone]

## 2016-05-29 NOTE — ED Notes (Signed)
Pt with officers in custody.

## 2016-05-29 NOTE — ED Triage Notes (Signed)
Pt sent from jail, Pt states he started feeling bad yesterday. Note from jail states pt was babbling, sweating and not making sense. Pt has not been eating or drinking for 3 days, will not open eyes and is on day 11 from withdrawing from Suboxone. Pt states he also normally drinks alcohol. Pt oriented to person, and place during triage but will make random statements during triage. [place] [facility] [place] is not taking medication.

## 2016-05-29 NOTE — ED Notes (Addendum)
Pt now sitting up, talking in complete sentences. Still altered. Confused where he is, what year it is. Denies any pain.

## 2016-05-30 ENCOUNTER — Inpatient Hospital Stay (HOSPITAL_COMMUNITY): Payer: Medicaid Other

## 2016-05-30 DIAGNOSIS — F1110 Opioid abuse, uncomplicated: Secondary | ICD-10-CM

## 2016-05-30 DIAGNOSIS — F1193 Opioid use, unspecified with withdrawal: Secondary | ICD-10-CM

## 2016-05-30 DIAGNOSIS — F319 Bipolar disorder, unspecified: Secondary | ICD-10-CM

## 2016-05-30 DIAGNOSIS — F1123 Opioid dependence with withdrawal: Secondary | ICD-10-CM

## 2016-05-30 DIAGNOSIS — G9340 Encephalopathy, unspecified: Secondary | ICD-10-CM

## 2016-05-30 DIAGNOSIS — F13239 Sedative, hypnotic or anxiolytic dependence with withdrawal, unspecified: Secondary | ICD-10-CM

## 2016-05-30 DIAGNOSIS — R410 Disorientation, unspecified: Secondary | ICD-10-CM

## 2016-05-30 DIAGNOSIS — F13939 Sedative, hypnotic or anxiolytic use, unspecified with withdrawal, unspecified: Secondary | ICD-10-CM

## 2016-05-30 LAB — COMPREHENSIVE METABOLIC PANEL
ALK PHOS: 53 U/L (ref 38–126)
ALT: 53 U/L (ref 17–63)
AST: 30 U/L (ref 15–41)
Albumin: 3.9 g/dL (ref 3.5–5.0)
Anion gap: 7 (ref 5–15)
BILIRUBIN TOTAL: 1.2 mg/dL (ref 0.3–1.2)
BUN: 25 mg/dL — AB (ref 6–20)
CALCIUM: 9.3 mg/dL (ref 8.9–10.3)
CO2: 21 mmol/L — ABNORMAL LOW (ref 22–32)
CREATININE: 0.75 mg/dL (ref 0.61–1.24)
Chloride: 115 mmol/L — ABNORMAL HIGH (ref 101–111)
Glucose, Bld: 99 mg/dL (ref 65–99)
Potassium: 4 mmol/L (ref 3.5–5.1)
Sodium: 143 mmol/L (ref 135–145)
TOTAL PROTEIN: 6.9 g/dL (ref 6.5–8.1)

## 2016-05-30 LAB — CBC
HCT: 44.9 % (ref 40.0–52.0)
Hemoglobin: 15.9 g/dL (ref 13.0–18.0)
MCH: 30.6 pg (ref 26.0–34.0)
MCHC: 35.4 g/dL (ref 32.0–36.0)
MCV: 86.4 fL (ref 80.0–100.0)
PLATELETS: 218 10*3/uL (ref 150–440)
RBC: 5.19 MIL/uL (ref 4.40–5.90)
RDW: 13.1 % (ref 11.5–14.5)
WBC: 11 10*3/uL — AB (ref 3.8–10.6)

## 2016-05-30 LAB — TSH: TSH: 1.342 u[IU]/mL (ref 0.350–4.500)

## 2016-05-30 MED ORDER — HALOPERIDOL LACTATE 5 MG/ML IJ SOLN
2.0000 mg | Freq: Once | INTRAMUSCULAR | Status: AC
  Administered 2016-05-30: 2 mg via INTRAVENOUS
  Filled 2016-05-30: qty 1

## 2016-05-30 MED ORDER — DIPHENHYDRAMINE HCL 50 MG/ML IJ SOLN
12.5000 mg | Freq: Once | INTRAMUSCULAR | Status: AC
  Administered 2016-05-30: 12.5 mg via INTRAVENOUS
  Filled 2016-05-30: qty 1

## 2016-05-30 MED ORDER — AMLODIPINE BESYLATE 5 MG PO TABS
5.0000 mg | ORAL_TABLET | Freq: Every day | ORAL | Status: DC
  Administered 2016-05-30 – 2016-06-01 (×3): 5 mg via ORAL
  Filled 2016-05-30 (×4): qty 1

## 2016-05-30 MED ORDER — LORAZEPAM 2 MG/ML IJ SOLN
2.0000 mg | Freq: Once | INTRAMUSCULAR | Status: AC
  Administered 2016-05-30: 2 mg via INTRAVENOUS
  Filled 2016-05-30: qty 1

## 2016-05-30 NOTE — Care Management (Signed)
RNCM consult placed for unknown reason. Patient presents from jail. He has a PCP and Medicaid. No needs anticipated. Will monitor chart for needs.

## 2016-05-30 NOTE — Plan of Care (Signed)
Problem: Safety: Goal: Ability to remain free from injury will improve Outcome: Progressing Frequently rounding on patient, individualized elimination schedule, neuro checks.

## 2016-05-30 NOTE — Consult Note (Signed)
Reason for Consult:Altered mental status Referring Physician: Cherlynn Kaiser  CC: Altered mental status  HPI: Ernest Romero is an 50 y.o. male who is unable to provide any history today due to his mental status.  All history obtained from the chart.  Patient with a known history of essential hypertension, bipolar disorder was brought into the ED because he was babbling, sweating and not making any sense. Patient has been not eating or drinking much for the past 3 days. Patient was started on new psychiatry medications (unclear which one) 3 days ago after which the patient was altered.  Patient was drinking alcohol prior to his [facility] the [facility] 15 days ago.  According to security officers patient was on day 11 from withdrawing from Suboxone but Suboxone is on his medication list.      Past Medical History:  Diagnosis Date  . Bipolar 1 disorder (HCC)   . Hypertension     History reviewed. No pertinent surgical history.  Family history: Father deceased from suicide.  Mother alive with CAD.    Social History:  reports that he has been smoking.  He has never used smokeless tobacco. He reports that he drinks alcohol. He reports that he uses drugs.  No Known Allergies  Medications:  I have reviewed the patient's current medications. Prior to Admission:  Prescriptions Prior to Admission  Medication Sig Dispense Refill Last Dose  . amLODipine (NORVASC) 5 MG tablet Take 5 mg by mouth daily.  3 unknown  . LATUDA 40 MG TABS tablet Take 40 mg by mouth daily.  4 unknown  . lithium carbonate (ESKALITH) 450 MG CR tablet Take 450 mg by mouth 2 (two) times daily.  4 unknown  . risperiDONE (RISPERDAL) 3 MG tablet Take 3 mg by mouth 2 (two) times daily.    unknown  . SUBOXONE 8-2 MG FILM Place 1.5 strips under the tongue daily.  1 unknown  . acetaminophen (TYLENOL) 325 MG tablet Take 2 tablets (650 mg total) by mouth every 6 (six) hours as needed. (Patient not taking: Reported on 05/29/2016) 60 tablet 0 Not  Taking at Unknown time  . clindamycin (CLEOCIN) 150 MG capsule Take 3 capsules (450 mg total) by mouth 3 (three) times daily. (Patient not taking: Reported on 05/29/2016) 90 capsule 0 Not Taking at Unknown time  . ibuprofen (MOTRIN IB) 200 MG tablet Take 3 tablets (600 mg total) by mouth every 6 (six) hours as needed. (Patient not taking: Reported on 05/29/2016) 60 tablet 0 Not Taking at Unknown time  . ondansetron (ZOFRAN ODT) 4 MG disintegrating tablet Take 1 tablet (4 mg total) by mouth every 8 (eight) hours as needed for nausea or vomiting. (Patient not taking: Reported on 05/29/2016) 20 tablet 0 Not Taking at Unknown time  . oxyCODONE-acetaminophen (PERCOCET) 7.5-325 MG tablet Take 1 tablet by mouth every 4 (four) hours as needed for severe pain. (Patient not taking: Reported on 05/29/2016) 20 tablet 0 Not Taking at Unknown time  . sulfamethoxazole-trimethoprim (BACTRIM DS,SEPTRA DS) 800-160 MG tablet Take 1 tablet by mouth 2 (two) times daily. (Patient not taking: Reported on 05/29/2016) 20 tablet 0 Not Taking at Unknown time   Scheduled: . enoxaparin (LOVENOX) injection  40 mg Subcutaneous Q24H    ROS: History obtained from the patient  General ROS: negative for - chills, fatigue, fever, night sweats, weight gain or weight loss Psychological ROS: negative for - behavioral disorder, hallucinations, memory difficulties, mood swings or suicidal ideation Ophthalmic ROS: negative for - blurry  vision, double vision, eye pain or loss of vision ENT ROS: negative for - epistaxis, nasal discharge, oral lesions, sore throat, tinnitus or vertigo Allergy and Immunology ROS: negative for - hives or itchy/watery eyes Hematological and Lymphatic ROS: negative for - bleeding problems, bruising or swollen lymph nodes Endocrine ROS: negative for - galactorrhea, hair pattern changes, polydipsia/polyuria or temperature intolerance Respiratory ROS: negative for - cough, hemoptysis, shortness of breath or  wheezing Cardiovascular ROS: negative for - chest pain, dyspnea on exertion, edema or irregular heartbeat Gastrointestinal ROS: negative for - abdominal pain, diarrhea, hematemesis, nausea/vomiting or stool incontinence Genito-Urinary ROS: negative for - dysuria, hematuria, incontinence or urinary frequency/urgency Musculoskeletal ROS: negative for - joint swelling or muscular weakness Neurological ROS: as noted in HPI Dermatological ROS: negative for rash and skin lesion changes  Physical Examination: Blood pressure (!) 139/104, pulse 92, temperature 97.9 F (36.6 C), resp. rate 19, height 5\' 10"  (1.778 m), weight 86.5 kg (190 lb 12.8 oz), SpO2 96 %.  HEENT-  Normocephalic, no lesions, without obvious abnormality.  Normal external eye and conjunctiva.  Normal TM's bilaterally.  Normal auditory canals and external ears. Normal external nose, mucus membranes and septum.  Normal pharynx. Cardiovascular- S1, S2 normal, pulses palpable throughout   Lungs- chest clear, no wheezing, rales, normal symmetric air entry Abdomen- soft, non-tender; bowel sounds normal; no masses,  no organomegaly Extremities- no edema Lymph-no adenopathy palpable Musculoskeletal-no joint tenderness, deformity or swelling Skin-warm and dry, no hyperpigmentation, vitiligo, or suspicious lesions  Neurological Examination Mental Status: Alert, oriented to place and year.  Does not know the month or date.  Able to recall the President.  Very tangential otherwise and at times responds to questions not being asked.  Follows simple commands.  Needs extensive reinforcement for 3-step commands.  Speech fluent without evidence of aphasia.  Initially cooperative but then closes his eyes and is less cooperative for the remainder of the exam.   Cranial Nerves: II: Discs unable to be visualized; Visual fields grossly normal, pupils unable to be tested.   III,IV, VI: ptosis not present, extra-ocular motions intact bilaterally V,VII:  smile symmetric, facial light touch sensation normal bilaterally VIII: hearing normal bilaterally IX,X: gag reflex present XI: bilateral shoulder shrug XII: midline tongue extension Motor: Right : Upper extremity   5/5    Left:     Upper extremity   5/5  Lower extremity   5/5     Lower extremity   5/5 Tone and bulk:normal tone throughout; no atrophy noted Sensory: Pinprick and light touch intact throughout, bilaterally Deep Tendon Reflexes: 2+ in the upper extremities, absent in the lower extremities Plantars: Right: downgoing   Left: downgoing Cerebellar: Normal finger-to-nose and normal heel-to-shin testing bilaterally Gait: not tested due to safety concerns   Laboratory Studies:   Basic Metabolic Panel:  Recent Labs Lab 05/29/16 1423 05/30/16 0456  NA 143 143  K 4.3 4.0  CL 111 115*  CO2 22 21*  GLUCOSE 130* 99  BUN 27* 25*  CREATININE 0.92 0.75  CALCIUM 10.2 9.3    Liver Function Tests:  Recent Labs Lab 05/29/16 1423 05/30/16 0456  AST 36 30  ALT 68* 53  ALKPHOS 64 53  BILITOT 1.4* 1.2  PROT 8.4* 6.9  ALBUMIN 4.5 3.9   No results for input(s): LIPASE, AMYLASE in the last 168 hours.  Recent Labs Lab 05/29/16 1734  AMMONIA 12    CBC:  Recent Labs Lab 05/29/16 1423 05/30/16 0456  WBC 13.3* 11.0*  NEUTROABS 9.6*  --   HGB 18.6* 15.9  HCT 53.6* 44.9  MCV 86.9 86.4  PLT 251 218    Cardiac Enzymes:  Recent Labs Lab 05/29/16 1423  CKTOTAL 23*  TROPONINI <0.03    BNP: Invalid input(s): POCBNP  CBG: No results for input(s): GLUCAP in the last 168 hours.  Microbiology: Results for orders placed or performed during the hospital encounter of 05/29/16  MRSA PCR Screening     Status: None   Collection Time: 05/29/16  6:57 PM  Result Value Ref Range Status   MRSA by PCR NEGATIVE NEGATIVE Final    Comment:        The GeneXpert MRSA Assay (FDA approved for NASAL specimens only), is one component of a comprehensive MRSA  colonization surveillance program. It is not intended to diagnose MRSA infection nor to guide or monitor treatment for MRSA infections.     Coagulation Studies: No results for input(s): LABPROT, INR in the last 72 hours.  Urinalysis: No results for input(s): COLORURINE, LABSPEC, PHURINE, GLUCOSEU, HGBUR, BILIRUBINUR, KETONESUR, PROTEINUR, UROBILINOGEN, NITRITE, LEUKOCYTESUR in the last 168 hours.  Invalid input(s): APPERANCEUR  Lipid Panel:  No results found for: CHOL, TRIG, HDL, CHOLHDL, VLDL, LDLCALC  HgbA1C: No results found for: HGBA1C  Urine Drug Screen:     Component Value Date/Time   LABOPIA NONE DETECTED 05/29/2016 1607   COCAINSCRNUR NONE DETECTED 05/29/2016 1607   LABBENZ NONE DETECTED 05/29/2016 1607   AMPHETMU NONE DETECTED 05/29/2016 1607   THCU NONE DETECTED 05/29/2016 1607   LABBARB NONE DETECTED 05/29/2016 1607    Alcohol Level: No results for input(s): ETH in the last 168 hours.   Imaging: Dg Chest 1 View  Result Date: 05/29/2016 CLINICAL DATA:  Altered mental status. EXAM: CHEST 1 VIEW COMPARISON:  None. FINDINGS: The heart size and mediastinal contours are within normal limits. Both lungs are clear. The visualized skeletal structures are unremarkable. IMPRESSION: No active disease. Electronically Signed   By: Signa Kellaylor  Stroud M.D.   On: 05/29/2016 15:11   Ct Head Wo Contrast  Result Date: 05/29/2016 CLINICAL DATA:  Altered mental status.  Withdrawal symptoms EXAM: CT HEAD WITHOUT CONTRAST TECHNIQUE: Contiguous axial images were obtained from the base of the skull through the vertex without intravenous contrast. COMPARISON:  None. FINDINGS: Brain: Motion degradation of exam.  Repeat exam with improvement No acute intracranial hemorrhage. No focal mass lesion. No CT evidence of acute infarction. No midline shift or mass effect. No hydrocephalus. Basilar cisterns are patent. Vascular: Unremarkable Skull: No fracture Sinuses/Orbits: Paranasal sinuses and mastoid  air cells are clear. Orbits are clear. Other: None IMPRESSION: No intracranial abnormality. Electronically Signed   By: Genevive BiStewart  Edmunds M.D.   On: 05/29/2016 15:03     Assessment/Plan: 50 year old male with a history of psychiatric disease presenting with altered mental status.  Patient with a history of drug and alcohol abuse.  Has been jailed for the past 15 days.  Has had some alteration in medications since his incarceration.  Patient now off all medications other than prn Haldol, Ativan and Labetalol for BP.  Has received more Ativan than anything else. Head CT personally reviewed and shows no acute changes.  Likely not appropariate at this time for further imaging.  Neurological examination nonfocal.  WBC count elevated on admission but decreasing without antibiotic coverage.  Afebrile.   Unclear etiology of presentation.  May be related to medications or underlying psychiatric disorder. Can not rule out seizure.    Recommendations:  1.  Agree with simplifying medication regimen and psych to be involved. 2.  EEG     Thana Farr, MD Neurology [phone] 05/30/2016, 11:53 AM

## 2016-05-30 NOTE — Progress Notes (Signed)
[facility] [place] [facility]   PATIENT NAME: Ernest Romero    MR#:  [id number]  DATE OF BIRTH:  10/09/1965  SUBJECTIVE:   Pt. Here due to AMS, Encephalopathy.  Was agitated last night but improved this a.m. Sitter at bedside.    REVIEW OF SYSTEMS:    Review of Systems  Constitutional: Negative for chills and fever.  HENT: Negative for congestion and tinnitus.   Eyes: Negative for blurred vision and double vision.  Respiratory: Negative for cough, shortness of breath and wheezing.   Cardiovascular: Negative for chest pain, orthopnea and PND.  Gastrointestinal: Negative for abdominal pain, diarrhea, nausea and vomiting.  Genitourinary: Negative for dysuria and hematuria.  Neurological: Negative for dizziness, sensory change and focal weakness.  Psychiatric/Behavioral: Positive for hallucinations.  All other systems reviewed and are negative.   Nutrition: Heart healthy Tolerating Diet: Yes Tolerating PT: Ambulatory   DRUG ALLERGIES:  No Known Allergies  VITALS:  Blood pressure (!) 139/104, pulse 92, temperature 97.9 F (36.6 C), resp. rate 19, height 5\' 10"  (1.778 m), weight 86.5 kg (190 lb 12.8 oz), SpO2 96 %.  PHYSICAL EXAMINATION:   Physical Exam  GENERAL:  50 y.o.-year-old patient lying in the bed confused but alert.   EYES: Pupils equal, round, reactive to light and accommodation. No scleral icterus. Extraocular muscles intact.  HEENT: Head atraumatic, normocephalic. Oropharynx and nasopharynx clear.  NECK:  Supple, no jugular venous distention. No thyroid enlargement, no tenderness.  LUNGS: Normal breath sounds bilaterally, no wheezing, rales, rhonchi. No use of accessory muscles of respiration.  CARDIOVASCULAR: S1, S2 normal. No murmurs, rubs, or gallops.  ABDOMEN: Soft, nontender, nondistended. Bowel sounds present. No organomegaly or mass.  EXTREMITIES: No cyanosis, clubbing or edema b/l.    NEUROLOGIC: Cranial nerves II through XII are  intact. No focal Motor or sensory deficits b/l.   PSYCHIATRIC: The patient is alert and oriented x 1. Confused at times. SKIN: No obvious rash, lesion, or ulcer.    LABORATORY PANEL:   CBC  Recent Labs Lab 05/30/16 0456  WBC 11.0*  HGB 15.9  HCT 44.9  PLT 218   ------------------------------------------------------------------------------------------------------------------  Chemistries   Recent Labs Lab 05/30/16 0456  NA 143  K 4.0  CL 115*  CO2 21*  GLUCOSE 99  BUN 25*  CREATININE 0.75  CALCIUM 9.3  AST 30  ALT 53  ALKPHOS 53  BILITOT 1.2   ------------------------------------------------------------------------------------------------------------------  Cardiac Enzymes  Recent Labs Lab 05/29/16 1423  TROPONINI <0.03   ------------------------------------------------------------------------------------------------------------------  RADIOLOGY:  Dg Chest 1 View  Result Date: 05/29/2016 CLINICAL DATA:  Altered mental status. EXAM: CHEST 1 VIEW COMPARISON:  None. FINDINGS: The heart size and mediastinal contours are within normal limits. Both lungs are clear. The visualized skeletal structures are unremarkable. IMPRESSION: No active disease. Electronically Signed   By: Signa Kell M.D.   On: 05/29/2016 15:11   Ct Head Wo Contrast  Result Date: 05/29/2016 CLINICAL DATA:  Altered mental status.  Withdrawal symptoms EXAM: CT HEAD WITHOUT CONTRAST TECHNIQUE: Contiguous axial images were obtained from the base of the skull through the vertex without intravenous contrast. COMPARISON:  None. FINDINGS: Brain: Motion degradation of exam.  Repeat exam with improvement No acute intracranial hemorrhage. No focal mass lesion. No CT evidence of acute infarction. No midline shift or mass effect. No hydrocephalus. Basilar cisterns are patent. Vascular: Unremarkable Skull: No fracture Sinuses/Orbits: Paranasal sinuses and mastoid air cells are clear. Orbits are clear. Other:  None IMPRESSION: No  intracranial abnormality. Electronically Signed   By: Genevive BiStewart  Edmunds M.D.   On: 05/29/2016 15:03     ASSESSMENT AND PLAN:   50 year old male with past medical history of bipolar disorder, hypertension, osteoarthritis who presented to the hospital due to altered mental status/encephalopathy.  1. Altered mental status/encephalopathy-etiology unclear but suspected to be secondary to withdrawal from psychiatric drugs. -Urine drug screen negative, CT head negative on admission. No other focal metabolic or infectious abnormality. -Seen by neurology and EEG showing no evidence of acute seizures. Patient does not need any further imaging. -Await further psychiatric input and discussed with Dr. Toni Amendlapacs, I think patient would benefit from inpatient psychiatric services.  2. Sinus tachycardia-secondary to agitation.-No evidence of infectious source. - improved. Will d/c tele  3. Hx of ETOH abuse - cont. CIWA protocol  4. Essential HTN - resume Norvasc.  - cont. PrN labetolol.    All the records are reviewed and case discussed with Care Management/Social Worker. Management plans discussed with the patient, family and they are in agreement.  CODE STATUS: Full Code  DVT Prophylaxis: Lovenox  TOTAL TIME TAKING CARE OF THIS PATIENT: 30 minutes.   POSSIBLE D/C IN 1-2 DAYS, DEPENDING ON CLINICAL CONDITION.   [place] SirenSAINANI,Tammye Kahler J M.D on 05/30/2016 at 3:25 PM  Between 7am to 6pm - Pager - [phone]  After 6pm go to [email or web address] - Social research officer, governmentpassword EPAS [email or web address]  [facility] [place] [facility]  Office  [phone]  CC: Primary care physician; Pamala DuffelGREENBLATT, LAWRENCE H, MD

## 2016-05-30 NOTE — Progress Notes (Signed)
Pt getting increasingly agitated. Pt attempting to get out of bed, and pull of telemetry monitor. Pt having hallucinations and is screaming incoherently. MD Sheryle Hailiamond notified. MD to place orders. Will continue to monitor.   Ernest Romero, [name] M

## 2016-05-30 NOTE — Progress Notes (Signed)
Pt still impulsive despite extra dose of ativan. MD Sheryle Hailiamond notified. MD to place orders. Will continue to monitor.   Mayra NeerNesbitt, Cheryln Balcom M

## 2016-05-30 NOTE — Consult Note (Signed)
[place] [facility] Consult   Reason for Consult:  Consult for 50 year old man with a past history of opiate abuse and bipolar disorder currently in the hospital from jail with acute delirium Referring Physician:  Verdell Carmine Patient Identification: Ernest Romero MRN:  [id number] Principal Diagnosis: Acute delirium Diagnosis:   Patient Active Problem List   Diagnosis Date Noted  . Bipolar 1 disorder ([place]) [F31.9] 05/30/2016  . Opiate abuse, episodic [F11.10] 05/30/2016  . Benzodiazepine withdrawal ([place]) [F13.239] 05/30/2016  . Opiate withdrawal ([place]) [F11.23] 05/30/2016  . Acute delirium [R41.0] 05/29/2016    Total Time spent with patient: 1 hour  Subjective:   Ernest Romero is a 50 y.o. male patient admitted with patient was not able to make any articulate comment.  HPI:  Patient interviewed. Chart reviewed. Old notes and labs reviewed. Vitals reviewed. This is a 50 year old man was brought from jail with acute confusion. I tried to put the story together between what I can find in the chart his old notes and what the patient could tell me. Apparently he had been in jail for about 11 or 12 days. He says he thinks he was charged with communicating threats relating to a fight he had with the woman he was living with. In any case it sounds like after several days in jail he became acutely delirious area that he was brought to the emergency room where he was described as being very confused and not able to make any sense. He was admitted to the medical service and has been seen by medicine and neurology. When I spoke with him he initially spoke incomplete nonsense. After being awake for several minutes he was able to be a little bit more articulate an answer questions more clearly. Till remained intermittently confused with some degree of delirium. He tells me that he had been treated with lithium and Latuda which were his usual psychiatric medicines prescribed by his outpatient psychiatrist.  He also admitted that he had been abusing Xanax regularly as well. In addition he was on Suboxone for opiate dependence. He tells me that he had been taking the Suboxone right up until just before he went into jail this may not be exactly correct as his last prescription was filled at the end of June in any case patient does not remember getting delirious or confused while he was in jail. He sort of remembers having some hallucinations. Today he does not remember having any hallucinations in the hospital. He denies being depressed. Denies suicidal thoughts. Denies homicidal thoughts. Denied that he had been drinking alcohol anytime recently. This contradicts some of the history where it is reported that he had been drinking before coming to jail. Says he was not drinking at all.  Medical history: History of opiate dependence. Otherwise no clear chronic medical problems.  Social history: Had been living with a woman in [place]. He says it was a very bad situation with a lot of emotional upper or and that he does not want to go back there again. Gets disability. Not working.  Substance abuse history: History of opiate dependence. Says he was not using while he was getting Suboxone prescribed. Admits that he was using Xanax that he was abusing but only taking by his estimate about 15 mg a week. Denies that he had been drinking.  Past Psychiatric History: No known past inpatient psychiatric treatment. No history of suicide attempts no history of violence. He is not very good at describing what the bipolar  disorder was like but reviewing his old notes from his Suboxone clinic it sounds like he had not been showing any signs of psychosis or major mood swings anytime recently.  Risk to Self: Is patient at risk for suicide?: No Risk to Others:   Prior Inpatient Therapy:   Prior Outpatient Therapy:    Past Medical History:  Past Medical History:  Diagnosis Date  . Bipolar 1 disorder ([place])   .  Hypertension    History reviewed. No pertinent surgical history. Family History: History reviewed. No pertinent family history. Family Psychiatric  History: He is not aware of any family history Social History:  History  Alcohol Use  . Yes    Comment: unknown amount or last drink     History  Drug Use    Comment: unknown    Social History   Social History  . Marital status: Single    Spouse name: N/A  . Number of children: N/A  . Years of education: N/A   Social History Main Topics  . Smoking status: Current Every Day Smoker  . Smokeless tobacco: Never Used  . Alcohol use Yes     Comment: unknown amount or last drink  . Drug use:      Comment: unknown  . Sexual activity: No   Other Topics Concern  . None   Social History Narrative  . None   Additional Social History:    Allergies:  No Known Allergies  Labs:  Results for orders placed or performed during the hospital encounter of 05/29/16 (from the past 48 hour(s))  CBC with Differential/Platelet     Status: Abnormal   Collection Time: 05/29/16  2:23 PM  Result Value Ref Range   WBC 13.3 (H) 3.8 - 10.6 K/uL   RBC 6.17 (H) 4.40 - 5.90 MIL/uL   Hemoglobin 18.6 (H) 13.0 - 18.0 g/dL   HCT 53.6 (H) 40.0 - 52.0 %   MCV 86.9 80.0 - 100.0 fL   MCH 30.2 26.0 - 34.0 pg   MCHC 34.8 32.0 - 36.0 g/dL   RDW 12.9 11.5 - 14.5 %   Platelets 251 150 - 440 K/uL   Neutrophils Relative % 71 %   Neutro Abs 9.6 (H) 1.4 - 6.5 K/uL   Lymphocytes Relative 20 %   Lymphs Abs 2.6 1.0 - 3.6 K/uL   Monocytes Relative 8 %   Monocytes Absolute 1.0 0.2 - 1.0 K/uL   Eosinophils Relative 0 %   Eosinophils Absolute 0.0 0 - 0.7 K/uL   Basophils Relative 1 %   Basophils Absolute 0.1 0 - 0.1 K/uL  Comprehensive metabolic panel     Status: Abnormal   Collection Time: 05/29/16  2:23 PM  Result Value Ref Range   Sodium 143 135 - 145 mmol/L   Potassium 4.3 3.5 - 5.1 mmol/L   Chloride 111 101 - 111 mmol/L   CO2 22 22 - 32 mmol/L   Glucose,  Bld 130 (H) 65 - 99 mg/dL   BUN 27 (H) 6 - 20 mg/dL   Creatinine, Ser 0.92 0.61 - 1.24 mg/dL   Calcium 10.2 8.9 - 10.3 mg/dL   Total Protein 8.4 (H) 6.5 - 8.1 g/dL   Albumin 4.5 3.5 - 5.0 g/dL   AST 36 15 - 41 U/L   ALT 68 (H) 17 - 63 U/L   Alkaline Phosphatase 64 38 - 126 U/L   Total Bilirubin 1.4 (H) 0.3 - 1.2 mg/dL   GFR calc non Af Amer >  60 >60 mL/min   GFR calc Af Amer >60 >60 mL/min    Comment: (NOTE) The eGFR has been calculated using the CKD EPI equation. This calculation has not been validated in all clinical situations. eGFR's persistently <60 mL/min signify possible Chronic Kidney Disease.    Anion gap 10 5 - 15  CK     Status: Abnormal   Collection Time: 05/29/16  2:23 PM  Result Value Ref Range   Total CK 23 (L) 49 - 397 U/L  Troponin I     Status: None   Collection Time: 05/29/16  2:23 PM  Result Value Ref Range   Troponin I <0.03 <0.03 ng/mL  Acetaminophen level     Status: Abnormal   Collection Time: 05/29/16  2:23 PM  Result Value Ref Range   Acetaminophen (Tylenol), Serum <10 (L) 10 - 30 ug/mL    Comment:        THERAPEUTIC CONCENTRATIONS VARY SIGNIFICANTLY. A RANGE OF 10-30 ug/mL MAY BE AN EFFECTIVE CONCENTRATION FOR MANY PATIENTS. HOWEVER, SOME ARE BEST TREATED AT CONCENTRATIONS OUTSIDE THIS RANGE. ACETAMINOPHEN CONCENTRATIONS >150 ug/mL AT 4 HOURS AFTER INGESTION AND >50 ug/mL AT 12 HOURS AFTER INGESTION ARE OFTEN ASSOCIATED WITH TOXIC REACTIONS.   Salicylate level     Status: None   Collection Time: 05/29/16  2:23 PM  Result Value Ref Range   Salicylate Lvl <2.0 2.8 - 30.0 mg/dL  Lithium level     Status: Abnormal   Collection Time: 05/29/16  2:23 PM  Result Value Ref Range   Lithium Lvl 0.52 (L) 0.60 - 1.20 mmol/L  Urine Drug Screen, Qualitative ([facility] only)     Status: None   Collection Time: 05/29/16  4:07 PM  Result Value Ref Range   Tricyclic, Ur Screen NONE DETECTED NONE DETECTED   Amphetamines, Ur Screen NONE DETECTED NONE DETECTED    MDMA (Ecstasy)Ur Screen NONE DETECTED NONE DETECTED   Cocaine Metabolite,Ur [place] NONE DETECTED NONE DETECTED   Opiate, Ur Screen NONE DETECTED NONE DETECTED   Phencyclidine (PCP) Ur S NONE DETECTED NONE DETECTED   Cannabinoid 50 Ng, Ur [place] NONE DETECTED NONE DETECTED   Barbiturates, Ur Screen NONE DETECTED NONE DETECTED   Benzodiazepine, Ur Scrn NONE DETECTED NONE DETECTED   Methadone Scn, Ur NONE DETECTED NONE DETECTED    Comment: (NOTE) 254  Tricyclics, urine               Cutoff 1000 ng/mL 200  Amphetamines, urine             Cutoff 1000 ng/mL 300  MDMA (Ecstasy), urine           Cutoff 500 ng/mL 400  Cocaine Metabolite, urine       Cutoff 300 ng/mL 500  Opiate, urine                   Cutoff 300 ng/mL 600  Phencyclidine (PCP), urine      Cutoff 25 ng/mL 700  Cannabinoid, urine              Cutoff 50 ng/mL 800  Barbiturates, urine             Cutoff 200 ng/mL 900  Benzodiazepine, urine           Cutoff 200 ng/mL 1000 Methadone, urine                Cutoff 300 ng/mL 1100 1200 The urine drug screen provides only a preliminary, unconfirmed 1300 analytical test result  and should not be used for non-medical 1400 purposes. Clinical consideration and professional judgment should 1500 be applied to any positive drug screen result due to possible 1600 interfering substances. A more specific alternate chemical method 1700 must be used in order to obtain a confirmed analytical result.  1800 Gas chromato graphy / mass spectrometry (GC/MS) is the preferred 1900 confirmatory method.   Ammonia     Status: None   Collection Time: 05/29/16  5:34 PM  Result Value Ref Range   Ammonia 12 9 - 35 umol/L  MRSA PCR Screening     Status: None   Collection Time: 05/29/16  6:57 PM  Result Value Ref Range   MRSA by PCR NEGATIVE NEGATIVE    Comment:        The GeneXpert MRSA Assay (FDA approved for NASAL specimens only), is one component of a comprehensive MRSA colonization surveillance program. It  is not intended to diagnose MRSA infection nor to guide or monitor treatment for MRSA infections.   TSH     Status: None   Collection Time: 05/30/16  4:56 AM  Result Value Ref Range   TSH 1.342 0.350 - 4.500 uIU/mL  Comprehensive metabolic panel     Status: Abnormal   Collection Time: 05/30/16  4:56 AM  Result Value Ref Range   Sodium 143 135 - 145 mmol/L   Potassium 4.0 3.5 - 5.1 mmol/L   Chloride 115 (H) 101 - 111 mmol/L   CO2 21 (L) 22 - 32 mmol/L   Glucose, Bld 99 65 - 99 mg/dL   BUN 25 (H) 6 - 20 mg/dL   Creatinine, Ser 0.75 0.61 - 1.24 mg/dL   Calcium 9.3 8.9 - 10.3 mg/dL   Total Protein 6.9 6.5 - 8.1 g/dL   Albumin 3.9 3.5 - 5.0 g/dL   AST 30 15 - 41 U/L   ALT 53 17 - 63 U/L   Alkaline Phosphatase 53 38 - 126 U/L   Total Bilirubin 1.2 0.3 - 1.2 mg/dL   GFR calc non Af Amer >60 >60 mL/min   GFR calc Af Amer >60 >60 mL/min    Comment: (NOTE) The eGFR has been calculated using the CKD EPI equation. This calculation has not been validated in all clinical situations. eGFR's persistently <60 mL/min signify possible Chronic Kidney Disease.    Anion gap 7 5 - 15  CBC     Status: Abnormal   Collection Time: 05/30/16  4:56 AM  Result Value Ref Range   WBC 11.0 (H) 3.8 - 10.6 K/uL   RBC 5.19 4.40 - 5.90 MIL/uL   Hemoglobin 15.9 13.0 - 18.0 g/dL   HCT 44.9 40.0 - 52.0 %   MCV 86.4 80.0 - 100.0 fL   MCH 30.6 26.0 - 34.0 pg   MCHC 35.4 32.0 - 36.0 g/dL   RDW 13.1 11.5 - 14.5 %   Platelets 218 150 - 440 K/uL    Current Facility-Administered Medications  Medication Dose Route Frequency Provider Last Rate Last Dose  . amLODipine (NORVASC) tablet 5 mg  5 mg Oral Daily Henreitta Leber, MD   5 mg at 05/30/16 1750  . enoxaparin (LOVENOX) injection 40 mg  40 mg Subcutaneous Q24H Nicholes Mango, MD   40 mg at 05/29/16 2201  . labetalol (NORMODYNE,TRANDATE) injection 10 mg  10 mg Intravenous Q2H PRN Lance Coon, MD   10 mg at 05/30/16 0006  . LORazepam (ATIVAN) tablet 1 mg  1 mg  Oral Q6H PRN Illene Silver  Gouru, MD       Or  . LORazepam (ATIVAN) injection 2 mg  2 mg Intravenous Q4H PRN Nicholes Mango, MD   2 mg at 05/30/16 1010  . ondansetron (ZOFRAN) tablet 4 mg  4 mg Oral Q6H PRN Nicholes Mango, MD       Or  . ondansetron (ZOFRAN) injection 4 mg  4 mg Intravenous Q6H PRN Nicholes Mango, MD        Musculoskeletal: Strength & Muscle Tone: decreased Gait & Station: unable to stand Patient leans: N/A  Psychiatric Specialty Exam: Physical Exam  Nursing note and vitals reviewed. Constitutional: He appears well-developed.  HENT:  Head: Normocephalic and atraumatic.  Eyes: Conjunctivae are normal. Pupils are equal, round, and reactive to light.  Neck: Normal range of motion.  Cardiovascular: Normal heart sounds.   Respiratory: Effort normal. No respiratory distress.  GI: Soft.  Musculoskeletal: Normal range of motion.  Neurological: He is alert.  Skin: Skin is warm and dry.  Psychiatric: His affect is inappropriate. His speech is delayed and tangential. He expresses inappropriate judgment. He expresses no suicidal ideation. He exhibits abnormal recent memory. He is inattentive.    Review of Systems  Constitutional: Positive for malaise/fatigue.  HENT: Negative.   Eyes: Negative.   Respiratory: Negative.   Cardiovascular: Negative.   Gastrointestinal: Negative.   Musculoskeletal: Positive for myalgias.  Skin: Negative.   Neurological: Positive for weakness.  Psychiatric/Behavioral: Positive for memory loss. Negative for depression, hallucinations, substance abuse and suicidal ideas. The patient is not nervous/anxious and does not have insomnia.     Blood pressure 139/87, pulse 91, temperature 98.5 F (36.9 C), temperature source Oral, resp. rate 20, height '5\' 10"'$  (1.778 m), weight 86.5 kg (190 lb 12.8 oz), SpO2 94 %.Body mass index is 27.38 kg/m.  General Appearance: Disheveled  Eye Contact:  Minimal  Speech:  Slow and Slurred  Volume:  Decreased  Mood:  Dysphoric   Affect:  Flat  Thought Process:  Disorganized  Orientation:  Negative  Thought Content:  Illogical and Tangential  Suicidal Thoughts:  No  Homicidal Thoughts:  No  Memory:  Immediate;   Poor Recent;   Poor Remote;   Poor  Judgement:  Impaired  Insight:  Shallow  Psychomotor Activity:  Decreased  Concentration:  Concentration: Poor  Recall:  Poor  Fund of Knowledge:  Poor  Language:  Fair  Akathisia:  No  Handed:  Right  AIMS (if indicated):     Assets:  Desire for Improvement Physical Health  ADL's:  Impaired  Cognition:  Impaired,  Mild and Moderate  Sleep:        Treatment Plan Summary: Plan 50 year old man. When he first woke up and spoke with me he was completely delirious. After being awake for a few minutes he was able to be much more coherent but still intermittently confused. Appears to be recovering from a delirium that is most likely related to substances or substance withdrawal. I would guess this may be Xanax withdrawal. Might also be opiate withdrawal as well. Could be related to other substance use. He would not be the first person to come out of the jail in the last few weeks to be delirious for no clear reason and then to clear up in the hospital. Patient is not depressed he is not psychotic he is not suicidal he is not homicidal. He does not require inpatient psychiatric treatment. I am not going to restart his psychiatric medicine now but will follow-up tomorrow  as needed.  Disposition: Patient does not meet criteria for psychiatric inpatient admission.  Alethia Berthold, MD 05/30/2016 7:58 PM

## 2016-05-31 MED ORDER — BUPRENORPHINE HCL 2 MG SL SUBL
4.0000 mg | SUBLINGUAL_TABLET | Freq: Two times a day (BID) | SUBLINGUAL | Status: DC
  Administered 2016-05-31 – 2016-06-01 (×2): 4 mg via SUBLINGUAL
  Filled 2016-05-31 (×2): qty 2

## 2016-05-31 MED ORDER — LITHIUM CARBONATE ER 450 MG PO TBCR
450.0000 mg | EXTENDED_RELEASE_TABLET | Freq: Every day | ORAL | Status: DC
  Administered 2016-05-31: 450 mg via ORAL
  Filled 2016-05-31: qty 1

## 2016-05-31 MED ORDER — LURASIDONE HCL 40 MG PO TABS
40.0000 mg | ORAL_TABLET | Freq: Every day | ORAL | Status: DC
  Administered 2016-05-31: 40 mg via ORAL
  Filled 2016-05-31: qty 1

## 2016-05-31 NOTE — Progress Notes (Signed)
Nutrition Brief Note  Patient identified on the Malnutrition Screening Tool (MST) Report  Wt Readings from Last 15 Encounters:  05/29/16 190 lb 12.8 oz (86.5 kg)  02/07/16 200 lb (90.7 kg)  02/05/16 200 lb (90.7 kg)    Body mass index is 27.38 kg/m.   Pt reports good appetite prior to admission and no weight loss.   Current diet order is Heart Healthy, patient is consuming approximately 100% of meals at this time. Labs and medications reviewed.   No nutrition interventions warranted at this time. If nutrition issues arise, please consult RD.   Ernest Starcherate Merdith Adan MS, RD, LDN [phone] Pager  [phone] Weekend/On-Call Pager

## 2016-05-31 NOTE — Progress Notes (Signed)
[facility] [place] [facility]   PATIENT NAME: Ernest LevelsJohn Romero    MR#:  [id number]  DATE OF BIRTH:  03/27/1966  SUBJECTIVE:   Pt. Here due to AMS, Encephalopathy.  Does have periods of confusion and delirium which improves with Ativan.   REVIEW OF SYSTEMS:    Review of Systems  Constitutional: Negative for chills and fever.  HENT: Negative for congestion and tinnitus.   Eyes: Negative for blurred vision and double vision.  Respiratory: Negative for cough, shortness of breath and wheezing.   Cardiovascular: Negative for chest pain, orthopnea and PND.  Gastrointestinal: Negative for abdominal pain, diarrhea, nausea and vomiting.  Genitourinary: Negative for dysuria and hematuria.  Neurological: Negative for dizziness, sensory change and focal weakness.  Psychiatric/Behavioral: Positive for hallucinations.  All other systems reviewed and are negative.   Nutrition: Heart healthy Tolerating Diet: Yes Tolerating PT: Ambulatory   DRUG ALLERGIES:  No Known Allergies  VITALS:  Blood pressure (!) 126/98, pulse (!) 103, temperature 98.4 F (36.9 C), resp. rate 19, height 5\' 10"  (1.778 m), weight 86.5 kg (190 lb 12.8 oz), SpO2 95 %.  PHYSICAL EXAMINATION:   Physical Exam  GENERAL:  50 y.o.-year-old patient lying in the bed confused but arousable.   EYES: Pupils equal, round, reactive to light and accommodation. No scleral icterus. Extraocular muscles intact.  HEENT: Head atraumatic, normocephalic. Oropharynx and nasopharynx clear.  NECK:  Supple, no jugular venous distention. No thyroid enlargement, no tenderness.  LUNGS: Normal breath sounds bilaterally, no wheezing, rales, rhonchi. No use of accessory muscles of respiration.  CARDIOVASCULAR: S1, S2 normal. No murmurs, rubs, or gallops.  ABDOMEN: Soft, nontender, nondistended. Bowel sounds present. No organomegaly or mass.  EXTREMITIES: No cyanosis, clubbing or edema b/l.    NEUROLOGIC: Cranial nerves II  through XII are intact. No focal Motor or sensory deficits b/l.   PSYCHIATRIC: The patient is alert and oriented x 1. Delirious and confused still SKIN: No obvious rash, lesion, or ulcer.    LABORATORY PANEL:   CBC  Recent Labs Lab 05/30/16 0456  WBC 11.0*  HGB 15.9  HCT 44.9  PLT 218   ------------------------------------------------------------------------------------------------------------------  Chemistries   Recent Labs Lab 05/30/16 0456  NA 143  K 4.0  CL 115*  CO2 21*  GLUCOSE 99  BUN 25*  CREATININE 0.75  CALCIUM 9.3  AST 30  ALT 53  ALKPHOS 53  BILITOT 1.2   ------------------------------------------------------------------------------------------------------------------  Cardiac Enzymes  Recent Labs Lab 05/29/16 1423  TROPONINI <0.03   ------------------------------------------------------------------------------------------------------------------  RADIOLOGY:  Dg Chest 1 View  Result Date: 05/29/2016 CLINICAL DATA:  Altered mental status. EXAM: CHEST 1 VIEW COMPARISON:  None. FINDINGS: The heart size and mediastinal contours are within normal limits. Both lungs are clear. The visualized skeletal structures are unremarkable. IMPRESSION: No active disease. Electronically Signed   By: Signa Kellaylor  Stroud M.D.   On: 05/29/2016 15:11   Ct Head Wo Contrast  Result Date: 05/29/2016 CLINICAL DATA:  Altered mental status.  Withdrawal symptoms EXAM: CT HEAD WITHOUT CONTRAST TECHNIQUE: Contiguous axial images were obtained from the base of the skull through the vertex without intravenous contrast. COMPARISON:  None. FINDINGS: Brain: Motion degradation of exam.  Repeat exam with improvement No acute intracranial hemorrhage. No focal mass lesion. No CT evidence of acute infarction. No midline shift or mass effect. No hydrocephalus. Basilar cisterns are patent. Vascular: Unremarkable Skull: No fracture Sinuses/Orbits: Paranasal sinuses and mastoid air cells are clear.  Orbits are clear. Other: None IMPRESSION:  No intracranial abnormality. Electronically Signed   By: Genevive Bi M.D.   On: 05/29/2016 15:03     ASSESSMENT AND PLAN:   50 year old male with past medical history of bipolar disorder, hypertension, osteoarthritis who presented to the hospital due to altered mental status/encephalopathy.  1. Altered mental status/encephalopathy-etiology unclear but suspected to be secondary to withdrawal from psychiatric drugs. -Urine drug screen negative, CT head negative on admission. No other focal metabolic or infectious abnormality. -Seen by neurology and EEG showing no evidence of acute seizures.  - Appreciate psychiatric input and as per them this is still delirium and withdrawal from possible benzodiazepines also his urine drug screen is negative. They recommended resuming his psychiatric meds although has not been restarted. We'll discuss with psychiatry regarding further plan.  2. Sinus tachycardia-secondary to agitation.No evidence of infectious source. - improved.   3. Hx of ETOH abuse - cont. CIWA protocol  4. Essential HTN - resume Norvasc.  - cont. PrN labetolol.    All the records are reviewed and case discussed with Care Management/Social Worker. Management plans discussed with the patient, family and they are in agreement.  CODE STATUS: Full Code  DVT Prophylaxis: Lovenox  TOTAL TIME TAKING CARE OF THIS PATIENT: 30 minutes.   POSSIBLE D/C IN 1-2 DAYS, DEPENDING ON CLINICAL CONDITION.   Houston Siren M.D on 05/31/2016 at 2:49 PM  Between 7am to 6pm - Pager - [phone]  After 6pm go to [email or web address] - Social research officer, government  [facility]  Office  [phone]  CC: Primary care physician; Pamala Duffel, MD

## 2016-05-31 NOTE — Progress Notes (Signed)
Patient is up pacing in the room states he is anxious and needs ativan.  When he gets back to bed he falls asleep quickly.

## 2016-05-31 NOTE — Consult Note (Signed)
[facility] Consult   Reason for Consult:  Consult for 50 year old man with a past history of opiate abuse and bipolar disorder currently in the hospital from jail with acute delirium Referring Physician:  Verdell Carmine Patient Identification: Ernest Romero MRN:  [id number] Principal Diagnosis: Acute delirium Diagnosis:   Patient Active Problem List   Diagnosis Date Noted  . Bipolar 1 disorder ([place]) [F31.9] 05/30/2016  . Opiate abuse, episodic [F11.10] 05/30/2016  . Benzodiazepine withdrawal ([place]) [F13.239] 05/30/2016  . Opiate withdrawal ([place]) [F11.23] 05/30/2016  . Acute delirium [R41.0] 05/29/2016    Total Time spent with patient: 20 minutes  Subjective:   Ernest Romero is a 50 y.o. male patient admitted with I just haven't been feeling right  This is a follow-up note for Tuesday the 29th. This is a 50 year old man with a reported past history of bipolar disorder and opiate dependence who came into the hospital from jail delirious. I saw the patient yesterday and it appeared at that time that he was greatly improved. I spoke today with hospitalist service and with nursing both of whom were concerned that he appeared to still be agitated. When I came to see the patient this afternoon he was calm and cooperative and lucid. He was able to give me a clear history of recent events although he still said he was not feeling like he was thinking quite right. He seemed to be more convinced that his problems resulted from withdrawal from his Suboxone. He also repeated to me that he had been abusing Xanax is in Klonopin was before his time in jail continuing my supposition that he's having some withdrawal from that. We reviewed his past Suboxone dosage as well as his dosages of lithium and Latuda.  HPI:  Patient interviewed. Chart reviewed. Old notes and labs reviewed. Vitals reviewed. This is a 50 year old man was brought from jail with acute confusion. I tried to put the story together  between what I can find in the chart his old notes and what the patient could tell me. Apparently he had been in jail for about 11 or 12 days. He says he thinks he was charged with communicating threats relating to a fight he had with the woman he was living with. In any case it sounds like after several days in jail he became acutely delirious area that he was brought to the emergency room where he was described as being very confused and not able to make any sense. He was admitted to the medical service and has been seen by medicine and neurology. When I spoke with him he initially spoke incomplete nonsense. After being awake for several minutes he was able to be a little bit more articulate an answer questions more clearly. Till remained intermittently confused with some degree of delirium. He tells me that he had been treated with lithium and Latuda which were his usual psychiatric medicines prescribed by his outpatient psychiatrist. He also admitted that he had been abusing Xanax regularly as well. In addition he was on Suboxone for opiate dependence. He tells me that he had been taking the Suboxone right up until just before he went into jail this may not be exactly correct as his last prescription was filled at the end of June in any case patient does not remember getting delirious or confused while he was in jail. He sort of remembers having some hallucinations. Today he does not remember having any hallucinations in the hospital. He denies being depressed. Denies  suicidal thoughts. Denies homicidal thoughts. Denied that he had been drinking alcohol anytime recently. This contradicts some of the history where it is reported that he had been drinking before coming to jail. Says he was not drinking at all.  Medical history: History of opiate dependence. Otherwise no clear chronic medical problems.  Social history: Had been living with a woman in [place]. He says it was a very bad situation with a lot of  emotional upper or and that he does not want to go back there again. Gets disability. Not working.  Substance abuse history: History of opiate dependence. Says he was not using while he was getting Suboxone prescribed. Admits that he was using Xanax that he was abusing but only taking by his estimate about 15 mg a week. Denies that he had been drinking.  Past Psychiatric History: No known past inpatient psychiatric treatment. No history of suicide attempts no history of violence. He is not very good at describing what the bipolar disorder was like but reviewing his old notes from his Suboxone clinic it sounds like he had not been showing any signs of psychosis or major mood swings anytime recently.  Risk to Self: Is patient at risk for suicide?: No Risk to Others:   Prior Inpatient Therapy:   Prior Outpatient Therapy:    Past Medical History:  Past Medical History:  Diagnosis Date  . Bipolar 1 disorder (HCC)   . Hypertension    History reviewed. No pertinent surgical history. Family History: History reviewed. No pertinent family history. Family Psychiatric  History: He is not aware of any family history Social History:  History  Alcohol Use  . Yes    Comment: unknown amount or last drink     History  Drug Use    Comment: unknown    Social History   Social History  . Marital status: Single    Spouse name: N/A  . Number of children: N/A  . Years of education: N/A   Social History Main Topics  . Smoking status: Current Every Day Smoker  . Smokeless tobacco: Never Used  . Alcohol use Yes     Comment: unknown amount or last drink  . Drug use:      Comment: unknown  . Sexual activity: No   Other Topics Concern  . None   Social History Narrative  . None   Additional Social History:    Allergies:  No Known Allergies  Labs:  Results for orders placed or performed during the hospital encounter of 05/29/16 (from the past 48 hour(s))  TSH     Status: None   Collection  Time: 05/30/16  4:56 AM  Result Value Ref Range   TSH 1.342 0.350 - 4.500 uIU/mL  Comprehensive metabolic panel     Status: Abnormal   Collection Time: 05/30/16  4:56 AM  Result Value Ref Range   Sodium 143 135 - 145 mmol/L   Potassium 4.0 3.5 - 5.1 mmol/L   Chloride 115 (H) 101 - 111 mmol/L   CO2 21 (L) 22 - 32 mmol/L   Glucose, Bld 99 65 - 99 mg/dL   BUN 25 (H) 6 - 20 mg/dL   Creatinine, Ser 0.68 0.61 - 1.24 mg/dL   Calcium 9.3 8.9 - 93.4 mg/dL   Total Protein 6.9 6.5 - 8.1 g/dL   Albumin 3.9 3.5 - 5.0 g/dL   AST 30 15 - 41 U/L   ALT 53 17 - 63 U/L   Alkaline Phosphatase  53 38 - 126 U/L   Total Bilirubin 1.2 0.3 - 1.2 mg/dL   GFR calc non Af Amer >60 >60 mL/min   GFR calc Af Amer >60 >60 mL/min    Comment: (NOTE) The eGFR has been calculated using the CKD EPI equation. This calculation has not been validated in all clinical situations. eGFR's persistently <60 mL/min signify possible Chronic Kidney Disease.    Anion gap 7 5 - 15  CBC     Status: Abnormal   Collection Time: 05/30/16  4:56 AM  Result Value Ref Range   WBC 11.0 (H) 3.8 - 10.6 K/uL   RBC 5.19 4.40 - 5.90 MIL/uL   Hemoglobin 15.9 13.0 - 18.0 g/dL   HCT 44.9 40.0 - 52.0 %   MCV 86.4 80.0 - 100.0 fL   MCH 30.6 26.0 - 34.0 pg   MCHC 35.4 32.0 - 36.0 g/dL   RDW 13.1 11.5 - 14.5 %   Platelets 218 150 - 440 K/uL    Current Facility-Administered Medications  Medication Dose Route Frequency Provider Last Rate Last Dose  . amLODipine (NORVASC) tablet 5 mg  5 mg Oral Daily Henreitta Leber, MD   5 mg at 05/31/16 0907  . buprenorphine (SUBUTEX) SL tablet 4 mg  4 mg Sublingual BID PC Gonzella Lex, MD   4 mg at 05/31/16 1834  . enoxaparin (LOVENOX) injection 40 mg  40 mg Subcutaneous Q24H Nicholes Mango, MD   40 mg at 05/30/16 2057  . labetalol (NORMODYNE,TRANDATE) injection 10 mg  10 mg Intravenous Q2H PRN Lance Coon, MD   10 mg at 05/31/16 0603  . lithium carbonate (ESKALITH) CR tablet 450 mg  450 mg Oral QHS Gonzella Lex, MD      . lurasidone (LATUDA) tablet 40 mg  40 mg Oral Q supper Gonzella Lex, MD   40 mg at 05/31/16 1837  . ondansetron (ZOFRAN) tablet 4 mg  4 mg Oral Q6H PRN Nicholes Mango, MD       Or  . ondansetron (ZOFRAN) injection 4 mg  4 mg Intravenous Q6H PRN Nicholes Mango, MD        Musculoskeletal: Strength & Muscle Tone: decreased Gait & Station: unable to stand Patient leans: N/A  Psychiatric Specialty Exam: Physical Exam  Nursing note and vitals reviewed. Constitutional: He appears well-developed.  HENT:  Head: Normocephalic and atraumatic.  Eyes: Conjunctivae are normal. Pupils are equal, round, and reactive to light.  Neck: Normal range of motion.  Cardiovascular: Normal heart sounds.   Respiratory: Effort normal. No respiratory distress.  GI: Soft.  Musculoskeletal: Normal range of motion.  Neurological: He is alert.  Skin: Skin is warm and dry.  Psychiatric: His affect is inappropriate. His speech is delayed and tangential. He expresses inappropriate judgment. He expresses no suicidal ideation. He exhibits abnormal recent memory. He is inattentive.    Review of Systems  Constitutional: Positive for malaise/fatigue.  HENT: Negative.   Eyes: Negative.   Respiratory: Negative.   Cardiovascular: Negative.   Gastrointestinal: Negative.   Musculoskeletal: Positive for myalgias.  Skin: Negative.   Neurological: Positive for weakness.  Psychiatric/Behavioral: Positive for memory loss. Negative for depression, hallucinations, substance abuse and suicidal ideas. The patient is not nervous/anxious and does not have insomnia.     Blood pressure (!) 153/101, pulse 89, temperature 98.6 F (37 C), temperature source Oral, resp. rate 20, height '5\' 10"'$  (1.778 m), weight 86.5 kg (190 lb 12.8 oz), SpO2 94 %.Body mass index  is 27.38 kg/m.  General Appearance: Disheveled  Eye Contact:  Minimal  Speech:  Clear and Coherent and Slow  Volume:  Decreased  Mood:  Euthymic  Affect:   Flat  Thought Process:  Goal Directed  Orientation:  Negative  Thought Content:  Logical and Tangential  Suicidal Thoughts:  No  Homicidal Thoughts:  No  Memory:  Immediate;   Poor Recent;   Poor Remote;   Poor  Judgement:  Impaired  Insight:  Shallow  Psychomotor Activity:  Decreased  Concentration:  Concentration: Poor  Recall:  Poor  Fund of Knowledge:  Poor  Language:  Fair  Akathisia:  No  Handed:  Right  AIMS (if indicated):     Assets:  Desire for Improvement Physical Health  ADL's:  Impaired  Cognition:  Impaired,  Mild and Moderate  Sleep:        Treatment Plan Summary: Plan Follow-up for this 50 year old man with delirium. Looks much better today than he did yesterday. No indication of any acute dangerousness. I have discontinued the Ativan as probably being contraindicated. I have restarted him on his Suboxone although at a lower dose than what he was taking previously. Restarted at 4 mg twice a day. We will need to make sure that he has an outpatient provider for this when he is discharged. I have also restarted his lithium and Latuda at the low doses he quoted to me. We will follow-up tomorrow. If he is stable he will likely be ready for discharge at that time.  Disposition: Patient does not meet criteria for psychiatric inpatient admission.  Alethia Berthold, MD 05/31/2016 7:17 PM

## 2016-05-31 NOTE — Progress Notes (Signed)
CIWA score elevated to 8. Tremors, anxiety, mild sweating.  States he is anxious again, but can barely stay awake.

## 2016-05-31 NOTE — Progress Notes (Signed)
Patient keeps getting up and standing at side of bed or walking aroun the bed.  When asked what he needs he says he just had to stand. Asked if he was feeling anxious and he said he was.   Ativan 1 mg po given.

## 2016-06-01 MED ORDER — LITHIUM CARBONATE ER 450 MG PO TBCR: 450.0000 mg | EXTENDED_RELEASE_TABLET | Freq: Every day | ORAL | 0 refills | Status: AC

## 2016-06-01 MED ORDER — LATUDA 40 MG PO TABS: 40.0000 mg | ORAL_TABLET | Freq: Every day | ORAL | 0 refills | Status: DC

## 2016-06-01 NOTE — Progress Notes (Signed)
Patient questioning about having to go back to jail once he is discharged[facility] who looked into the patient and stated he does not have to come back to jail, he was released on bond. Patient just needs to make sure he appears at his court date.

## 2016-06-01 NOTE — Progress Notes (Signed)
Mother has arrived. IV removed. Patient given paper scrubs to wear home.

## 2016-06-01 NOTE — Progress Notes (Signed)
Discharge instructions given. Prescriptions given to patient and instructed him to get in touch with his provider for his subutex. Patient verbalized understanding. Patient's mother is coming to get him, but has not arrived. Will remove IV once she arrives.

## 2016-06-01 NOTE — Discharge Summary (Signed)
[facility] [place] [facility]   PATIENT NAME: Ernest Romero    MR#:  [id number]  DATE OF BIRTH:  06/15/1966  DATE OF ADMISSION:  05/29/2016 ADMITTING PHYSICIAN: Ramonita Lab, MD  DATE OF DISCHARGE: 06/01/2016  2:07 PM  PRIMARY CARE PHYSICIAN: Pamala Duffel, MD    ADMISSION DIAGNOSIS:  Tachycardia [R00.0] Acute encephalopathy [G93.40] Withdrawal from sedative, hypnotic, or anxiolytic drug (HCC) [F13.239]  DISCHARGE DIAGNOSIS:  Principal Problem:   Acute delirium Active Problems:   Bipolar 1 disorder (HCC)   Opiate abuse, episodic   Benzodiazepine withdrawal (HCC)   Opiate withdrawal (HCC)   SECONDARY DIAGNOSIS:   Past Medical History:  Diagnosis Date  . Bipolar 1 disorder (HCC)   . Hypertension     HOSPITAL COURSE:   50 year old male with past medical history of bipolar disorder, hypertension, osteoarthritis who presented to the hospital due to altered mental status/encephalopathy.  1. Altered mental status/encephalopathy--This was secondary to withdrawal from benzodiazepines or even opiates. -Although Urine drug screen negative, CT head negative on admission. No other focal metabolic or infectious abnormality. -Patient was seen by neurology had EEG which was negative for seizures. A psychiatric consult was obtained and he was placed on some Ativan as needed. Eventually he was restarted back on his psych meds including lithium, Latuda, Risperdal. His mental status has improved significantly over the past 48 hours and he is currently alert awake oriented. As per psychiatry does not require any inpatient psychiatric care. He will follow-up as an outpatient with his psychiatrist and also resume his Suboxone program.  2. Sinus tachycardia-secondary to agitation. There was No evidence of infectious source. - this has resolved now.  3. Hx of ETOH abuse -he was maintained on CIWA protocol while in hospital and it has improved and clinically he has  no evidence of this presently.   4. Essential HTN - he will resume his Norvasc upon discharge.    DISCHARGE CONDITIONS:   Stable.   CONSULTS OBTAINED:  Treatment Team:  Pauletta Browns, MD Audery Amel, MD  DRUG ALLERGIES:  No Known Allergies  DISCHARGE MEDICATIONS:     Medication List    STOP taking these medications   acetaminophen 325 MG tablet Commonly known as:  TYLENOL   clindamycin 150 MG capsule Commonly known as:  CLEOCIN   ibuprofen 200 MG tablet Commonly known as:  MOTRIN IB   oxyCODONE-acetaminophen 7.5-325 MG tablet Commonly known as:  PERCOCET   sulfamethoxazole-trimethoprim 800-160 MG tablet Commonly known as:  BACTRIM DS,SEPTRA DS     TAKE these medications   amLODipine 5 MG tablet Commonly known as:  NORVASC Take 5 mg by mouth daily.   LATUDA 40 MG Tabs tablet Generic drug:  lurasidone Take 1 tablet (40 mg total) by mouth daily.   lithium carbonate 450 MG CR tablet Commonly known as:  ESKALITH Take 1 tablet (450 mg total) by mouth at bedtime. What changed:  when to take this   ondansetron 4 MG disintegrating tablet Commonly known as:  ZOFRAN ODT Take 1 tablet (4 mg total) by mouth every 8 (eight) hours as needed for nausea or vomiting.   risperiDONE 3 MG tablet Commonly known as:  RISPERDAL Take 3 mg by mouth 2 (two) times daily.   SUBOXONE 8-2 MG Film Generic drug:  Buprenorphine HCl-Naloxone HCl Place 1.5 strips under the tongue daily.         DISCHARGE INSTRUCTIONS:   DIET:  Cardiac diet  DISCHARGE CONDITION:  Stable  ACTIVITY:  Activity as tolerated  OXYGEN:  Home Oxygen: No.   Oxygen Delivery: room air  DISCHARGE LOCATION:  home   If you experience worsening of your admission symptoms, develop shortness of breath, life threatening emergency, suicidal or homicidal thoughts you must seek medical attention immediately by calling 911 or calling your MD immediately  if symptoms less severe.  You Must read  complete instructions/literature along with all the possible adverse reactions/side effects for all the Medicines you take and that have been prescribed to you. Take any new Medicines after you have completely understood and accpet all the possible adverse reactions/side effects.   Please note  You were cared for by a hospitalist during your hospital stay. If you have any questions about your discharge medications or the care you received while you were in the hospital after you are discharged, you can call the unit and asked to speak with the hospitalist on call if the hospitalist that took care of you is not available. Once you are discharged, your primary care physician will handle any further medical issues. Please note that NO REFILLS for any discharge medications will be authorized once you are discharged, as it is imperative that you return to your primary care physician (or establish a relationship with a primary care physician if you do not have one) for your aftercare needs so that they can reassess your need for medications and monitor your lab values.     Today   Mental status much improved and back to baseline now.  No complaints presently. Discussed w/ Psych and stable for discharge.   VITAL SIGNS:  Blood pressure (!) 148/131, pulse 71, temperature 97.9 F (36.6 C), temperature source Oral, resp. rate 16, height 5\' 10"  (1.778 m), weight 86.5 kg (190 lb 12.8 oz), SpO2 95 %.  I/O:   Intake/Output Summary (Last 24 hours) at 06/01/16 1511 Last data filed at 06/01/16 1016  Gross per 24 hour  Intake              960 ml  Output              700 ml  Net              260 ml    PHYSICAL EXAMINATION:  GENERAL:  50 y.o.-year-old patient lying in the bed with no acute distress.  EYES: Pupils equal, round, reactive to light and accommodation. No scleral icterus. Extraocular muscles intact.  HEENT: Head atraumatic, normocephalic. Oropharynx and nasopharynx clear.  NECK:  Supple, no  jugular venous distention. No thyroid enlargement, no tenderness.  LUNGS: Normal breath sounds bilaterally, no wheezing, rales,rhonchi. No use of accessory muscles of respiration.  CARDIOVASCULAR: S1, S2 normal. No murmurs, rubs, or gallops.  ABDOMEN: Soft, non-tender, non-distended. Bowel sounds present. No organomegaly or mass.  EXTREMITIES: No pedal edema, cyanosis, or clubbing.  NEUROLOGIC: Cranial nerves II through XII are intact. No focal motor or sensory defecits b/l.  PSYCHIATRIC: The patient is alert and oriented x 3.  SKIN: No obvious rash, lesion, or ulcer.   DATA REVIEW:   CBC  Recent Labs Lab 05/30/16 0456  WBC 11.0*  HGB 15.9  HCT 44.9  PLT 218    Chemistries   Recent Labs Lab 05/30/16 0456  NA 143  K 4.0  CL 115*  CO2 21*  GLUCOSE 99  BUN 25*  CREATININE 0.75  CALCIUM 9.3  AST 30  ALT 53  ALKPHOS 53  BILITOT 1.2    Cardiac Enzymes  Recent Labs Lab 05/29/16 1423  TROPONINI <0.03    Microbiology Results  Results for orders placed or performed during the hospital encounter of 05/29/16  MRSA PCR Screening     Status: None   Collection Time: 05/29/16  6:57 PM  Result Value Ref Range Status   MRSA by PCR NEGATIVE NEGATIVE Final    Comment:        The GeneXpert MRSA Assay (FDA approved for NASAL specimens only), is one component of a comprehensive MRSA colonization surveillance program. It is not intended to diagnose MRSA infection nor to guide or monitor treatment for MRSA infections.     RADIOLOGY:  No results found.    Management plans discussed with the patient, family and they are in agreement.  CODE STATUS:     Code Status Orders        Start     Ordered   05/29/16 1836  Full code  Continuous     05/29/16 1835    Code Status History    Date Active Date Inactive Code Status Order ID Comments User Context   This patient has a current code status but no historical code status.      TOTAL TIME TAKING CARE OF THIS  PATIENT: 40 minutes.    Houston Siren M.D on 06/01/2016 at 3:11 PM  Between 7am to 6pm - Pager - [phone]  After 6pm go to [email or web address] - Social research officer, government  [facility]  Office  [phone]  CC: Primary care physician; Pamala Duffel, MD

## 2016-11-03 ENCOUNTER — Emergency Department: Payer: Medicaid Other

## 2016-11-03 ENCOUNTER — Encounter: Payer: Self-pay | Admitting: Emergency Medicine

## 2016-11-03 ENCOUNTER — Emergency Department
Admission: EM | Admit: 2016-11-03 | Discharge: 2016-11-04 | Disposition: A | Discharge status: Home / Self Care | Payer: Medicaid Other | Attending: Emergency Medicine | Admitting: Emergency Medicine

## 2016-11-03 DIAGNOSIS — S2242XA Multiple fractures of ribs, left side, initial encounter for closed fracture: Secondary | ICD-10-CM

## 2016-11-03 DIAGNOSIS — F419 Anxiety disorder, unspecified: Secondary | ICD-10-CM | POA: Diagnosis not present

## 2016-11-03 DIAGNOSIS — F1010 Alcohol abuse, uncomplicated: Secondary | ICD-10-CM | POA: Insufficient documentation

## 2016-11-03 DIAGNOSIS — Y929 Unspecified place or not applicable: Secondary | ICD-10-CM | POA: Insufficient documentation

## 2016-11-03 DIAGNOSIS — I10 Essential (primary) hypertension: Secondary | ICD-10-CM | POA: Diagnosis not present

## 2016-11-03 DIAGNOSIS — S2241XA Multiple fractures of ribs, right side, initial encounter for closed fracture: Secondary | ICD-10-CM | POA: Insufficient documentation

## 2016-11-03 DIAGNOSIS — R42 Dizziness and giddiness: Secondary | ICD-10-CM | POA: Insufficient documentation

## 2016-11-03 DIAGNOSIS — Y9301 Activity, walking, marching and hiking: Secondary | ICD-10-CM | POA: Insufficient documentation

## 2016-11-03 DIAGNOSIS — R410 Disorientation, unspecified: Secondary | ICD-10-CM | POA: Diagnosis present

## 2016-11-03 DIAGNOSIS — F1510 Other stimulant abuse, uncomplicated: Secondary | ICD-10-CM | POA: Diagnosis not present

## 2016-11-03 DIAGNOSIS — Y999 Unspecified external cause status: Secondary | ICD-10-CM | POA: Insufficient documentation

## 2016-11-03 DIAGNOSIS — W010XXA Fall on same level from slipping, tripping and stumbling without subsequent striking against object, initial encounter: Secondary | ICD-10-CM | POA: Diagnosis not present

## 2016-11-03 DIAGNOSIS — F10929 Alcohol use, unspecified with intoxication, unspecified: Secondary | ICD-10-CM

## 2016-11-03 DIAGNOSIS — S299XXA Unspecified injury of thorax, initial encounter: Secondary | ICD-10-CM | POA: Diagnosis present

## 2016-11-03 DIAGNOSIS — F319 Bipolar disorder, unspecified: Secondary | ICD-10-CM | POA: Diagnosis present

## 2016-11-03 DIAGNOSIS — Z87891 Personal history of nicotine dependence: Secondary | ICD-10-CM | POA: Insufficient documentation

## 2016-11-03 DIAGNOSIS — W19XXXA Unspecified fall, initial encounter: Secondary | ICD-10-CM

## 2016-11-03 HISTORY — DX: Anxiety disorder, unspecified: F41.9

## 2016-11-03 LAB — LITHIUM LEVEL: LITHIUM LVL: 0.33 mmol/L — AB (ref 0.60–1.20)

## 2016-11-03 LAB — BASIC METABOLIC PANEL
Anion gap: 15 (ref 5–15)
BUN: 15 mg/dL (ref 6–20)
CHLORIDE: 108 mmol/L (ref 101–111)
CO2: 18 mmol/L — ABNORMAL LOW (ref 22–32)
Calcium: 9.5 mg/dL (ref 8.9–10.3)
Creatinine, Ser: 1.43 mg/dL — ABNORMAL HIGH (ref 0.61–1.24)
GFR calc non Af Amer: 55 mL/min — ABNORMAL LOW (ref >60)
Glucose, Bld: 100 mg/dL — ABNORMAL HIGH (ref 65–99)
POTASSIUM: 3.4 mmol/L — AB (ref 3.5–5.1)
SODIUM: 141 mmol/L (ref 135–145)

## 2016-11-03 LAB — CBC
HEMATOCRIT: 48.3 % (ref 40.0–52.0)
HEMOGLOBIN: 16.3 g/dL (ref 13.0–18.0)
MCH: 30.2 pg (ref 26.0–34.0)
MCHC: 33.8 g/dL (ref 32.0–36.0)
MCV: 89.5 fL (ref 80.0–100.0)
Platelets: 287 10*3/uL (ref 150–440)
RBC: 5.4 MIL/uL (ref 4.40–5.90)
RDW: 12.7 % (ref 11.5–14.5)
WBC: 13.5 10*3/uL — AB (ref 3.8–10.6)

## 2016-11-03 LAB — SALICYLATE LEVEL

## 2016-11-03 LAB — TROPONIN I: Troponin I: <0.03 ng/mL (ref <0.03)

## 2016-11-03 LAB — ACETAMINOPHEN LEVEL

## 2016-11-03 LAB — ETHANOL: Alcohol, Ethyl (B): 148 mg/dL — ABNORMAL HIGH (ref <5)

## 2016-11-03 MED ORDER — ADULT MULTIVITAMIN W/MINERALS CH
1.0000 | ORAL_TABLET | Freq: Every day | ORAL | Status: DC
  Administered 2016-11-03 – 2016-11-04 (×2): 1 via ORAL
  Filled 2016-11-03 (×2): qty 1

## 2016-11-03 MED ORDER — LORAZEPAM 1 MG PO TABS
ORAL_TABLET | ORAL | Status: AC
  Administered 2016-11-03: 1 mg via ORAL
  Filled 2016-11-03: qty 1

## 2016-11-03 MED ORDER — VITAMIN B-1 100 MG PO TABS
100.0000 mg | ORAL_TABLET | Freq: Every day | ORAL | Status: DC
  Administered 2016-11-04: 100 mg via ORAL
  Filled 2016-11-03: qty 1

## 2016-11-03 MED ORDER — LORAZEPAM 1 MG PO TABS: 1.0000 mg | ORAL_TABLET | Freq: Two times a day (BID) | ORAL | Status: DC

## 2016-11-03 MED ORDER — HALOPERIDOL LACTATE 5 MG/ML IJ SOLN
INTRAMUSCULAR | Status: AC
  Administered 2016-11-03: 16:00:00
  Filled 2016-11-03: qty 1

## 2016-11-03 MED ORDER — VITAMIN B-1 100 MG PO TABS
100.0000 mg | ORAL_TABLET | Freq: Once | ORAL | Status: AC
  Administered 2016-11-03: 100 mg via ORAL
  Filled 2016-11-03: qty 1

## 2016-11-03 MED ORDER — LORAZEPAM 0.5 MG PO TABS
1.0000 mg | ORAL_TABLET | Freq: Two times a day (BID) | ORAL | Status: DC
  Administered 2016-11-03 – 2016-11-04 (×2): 1 mg via ORAL
  Filled 2016-11-03 (×2): qty 2

## 2016-11-03 MED ORDER — LURASIDONE HCL 40 MG PO TABS
40.0000 mg | ORAL_TABLET | Freq: Every day | ORAL | Status: DC
  Administered 2016-11-04: 40 mg via ORAL
  Filled 2016-11-03 (×2): qty 1

## 2016-11-03 MED ORDER — HALOPERIDOL LACTATE 5 MG/ML IJ SOLN: 5.0000 mg | Freq: Once | INTRAMUSCULAR | Status: DC

## 2016-11-03 MED ORDER — THIAMINE HCL 100 MG/ML IJ SOLN: 100.0000 mg | Freq: Every day | INTRAMUSCULAR | Status: DC

## 2016-11-03 MED ORDER — LORAZEPAM 1 MG PO TABS
1.0000 mg | ORAL_TABLET | Freq: Once | ORAL | Status: AC
  Administered 2016-11-03: 1 mg via ORAL

## 2016-11-03 MED ORDER — LORAZEPAM 1 MG PO TABS
1.0000 mg | ORAL_TABLET | Freq: Once | ORAL | Status: AC
  Administered 2016-11-03: 1 mg via ORAL
  Filled 2016-11-03: qty 1

## 2016-11-03 MED ORDER — LITHIUM CARBONATE ER 450 MG PO TBCR
450.0000 mg | EXTENDED_RELEASE_TABLET | Freq: Every day | ORAL | Status: DC
  Administered 2016-11-03: 450 mg via ORAL
  Filled 2016-11-03: qty 1

## 2016-11-03 MED ORDER — LORAZEPAM 2 MG/ML IJ SOLN: 1.0000 mg | Freq: Four times a day (QID) | INTRAMUSCULAR | Status: DC | PRN

## 2016-11-03 MED ORDER — SODIUM CHLORIDE 0.9 % IV BOLUS (SEPSIS)
500.0000 mL | Freq: Once | INTRAVENOUS | Status: AC
  Administered 2016-11-03: 500 mL via INTRAVENOUS

## 2016-11-03 MED ORDER — FOLIC ACID 1 MG PO TABS
1.0000 mg | ORAL_TABLET | Freq: Every day | ORAL | Status: DC
  Administered 2016-11-03 – 2016-11-04 (×2): 1 mg via ORAL
  Filled 2016-11-03 (×2): qty 1

## 2016-11-03 MED ORDER — LORAZEPAM 1 MG PO TABS
1.0000 mg | ORAL_TABLET | Freq: Four times a day (QID) | ORAL | Status: DC | PRN
  Administered 2016-11-04: 1 mg via ORAL

## 2016-11-03 MED ORDER — LORAZEPAM 2 MG PO TABS
2.0000 mg | ORAL_TABLET | ORAL | Status: AC
  Administered 2016-11-03: 2 mg via ORAL
  Filled 2016-11-03: qty 1

## 2016-11-03 MED ORDER — LORAZEPAM 1 MG PO TABS
1.0000 mg | ORAL_TABLET | ORAL | Status: DC | PRN
  Filled 2016-11-03: qty 1

## 2016-11-03 NOTE — BH Assessment (Addendum)
Assessment Note  Ernest Romero is an 51 y.o. male. Who arrives to ER from home via [facility]. Patient lives in a trailer with 3-4 steps up to residence. Patient reports falling down his front wooden steps some time this afternoon (unable to give accurate history of timeline).  Pt reports that his roommate call the ambulance. Patient reports that he is anxious and ready to go home. Patient states that his mother will be his only source of transportation and that it's supposed to be very bad weather today. He reports that she only comes out during certain times of the day. Patient does report a  recent change in mood stating that he feels mentally drained and weary more than usual about finances. Patient has a history of Bipolar Disorder and reports taking Lamictal and Ativan. Patient states that he is med compliant. Patient [facility]. Patient reports an inability to obtain restful sleep.Patient states he has not slept in several days. Patient appears to be oriented although obviously intoxicated. Patient states that he routinely drink six to eight 12 oz beers 2/3  Days out of the week. Patient also reports taking two stacker energy pills from a convenience store. Patient will be placed on a precautionary hold to monitor overnight for safety.    Diagnosis: Bipolar Disorder, Alcohol Abuse Disorder   Past Medical History:  Past Medical History:  Diagnosis Date  . Anxiety   . Bipolar 1 disorder (HCC)   . Hypertension     History reviewed. No pertinent surgical history.  Family History: No family history on file.  Social History:  reports that he has quit smoking. He uses smokeless tobacco. He reports that he drinks about 14.4 oz of alcohol per week . He reports that he uses drugs.  Additional Social History:  Alcohol / Drug Use Pain Medications: SEE MAR  Prescriptions: SEE MAR  Over the Counter: Stackers  History of alcohol / drug use?: Yes Longest period of  sobriety (when/how long): Unknown  Negative Consequences of Use: Financial Withdrawal Symptoms: Agitation Substance #1 Name of Substance 1: Alcohol  1 - Age of First Use: 20's 1 - Amount (size/oz): 6-8 12oz beers  1 - Frequency: Binge, 1/2 days a week  1 - Duration: ongoing  1 - Last Use / Amount: PTA, 6 beers   CIWA: CIWA-Ar BP: 136/90 Pulse Rate: (!) 101 Nausea and Vomiting: no nausea and no vomiting Tactile Disturbances: none Tremor: three Auditory Disturbances: not present Paroxysmal Sweats: three Visual Disturbances: not present Anxiety: two Headache, Fullness in Head: none present Agitation: normal activity Orientation and Clouding of Sensorium: oriented and can do serial additions CIWA-Ar Total: 8 COWS:    Allergies: No Known Allergies  Home Medications:  (Not in a hospital admission)  OB/GYN Status:  No LMP for male patient.  General Assessment Data Location of Assessment: [facility] ED TTS Assessment: In system Is this a Tele or Face-to-Face Assessment?: Face-to-Face Is this an Initial Assessment or a Re-assessment for this encounter?: Initial Assessment Marital status: Single Living Arrangements: Non-relatives/Friends Can pt return to current living arrangement?: Yes Admission Status: Voluntary Is patient capable of signing voluntary admission?: Yes Referral Source: Self/Family/Friend Insurance type: Medicaid   Medical Screening Exam [facility] Walk-in ONLY) Medical Exam completed: Yes  Crisis Care Plan Living Arrangements: Non-relatives/Friends Legal Guardian: Other: (None ) Name of Psychiatrist: [facility] Name of Therapist: None   Education Status Is patient currently in school?: No Current Grade: n/a Highest grade of school  patient has completed: UTA Name of school: n/a Contact person: n/a   Risk to self with the past 6 months Suicidal Ideation: No Has patient been a risk to self within the past 6 months prior to admission? : No Suicidal Intent: No Has  patient had any suicidal intent within the past 6 months prior to admission? : No Is patient at risk for suicide?: No Suicidal Plan?: No Has patient had any suicidal plan within the past 6 months prior to admission? : No Access to Means: No What has been your use of drugs/alcohol within the last 12 months?: Alcohol BAL 148 Previous Attempts/Gestures: No How many times?: 0 Other Self Harm Risks: 0 Triggers for Past Attempts: Other (Comment) Intentional Self Injurious Behavior: None Family Suicide History: Yes (Father ) Recent stressful life event(s): Financial Problems Persecutory voices/beliefs?: No Depression: Yes Depression Symptoms: Feeling angry/irritable Substance abuse history and/or treatment for substance abuse?: Yes Suicide prevention information given to non-admitted patients: Yes  Risk to Others within the past 6 months Homicidal Ideation: No Does patient have any lifetime risk of violence toward others beyond the six months prior to admission? : No Thoughts of Harm to Others: No Current Homicidal Intent: No-Not Currently/Within Last 6 Months Current Homicidal Plan: No Access to Homicidal Means: No Identified Victim: n/ History of harm to others?: No Assessment of Violence: On admission Violent Behavior Description: None  Does patient have access to weapons?: No Criminal Charges Pending?: No Does patient have a court date: No Is patient on probation?: No  Psychosis Hallucinations: None noted Delusions: None noted  Mental Status Report Appearance/Hygiene: Disheveled, Other (Comment) (Hyperhidrosis) Eye Contact: Poor Motor Activity: Restlessness Speech: Pressured Level of Consciousness: Alert Mood: Depressed, Anxious Affect: Depressed, Anxious, Constricted Anxiety Level: None Thought Processes: Coherent Judgement: Partial Orientation: Time, Place, Person, Situation Obsessive Compulsive Thoughts/Behaviors: None  Cognitive Functioning Concentration:  Fair Memory: Remote Intact, Recent Intact IQ: Average Insight: Fair Impulse Control: Fair Appetite: Fair Weight Loss: 0 Weight Gain: 0 Sleep: Decreased Total Hours of Sleep: 0 (several days ) Vegetative Symptoms: None  ADLScreening [facility]) Patient's cognitive ability adequate to safely complete daily activities?: Yes Patient able to express need for assistance with ADLs?: Yes Independently performs ADLs?: Yes (appropriate for developmental age)  Prior Inpatient Therapy Prior Inpatient Therapy: Yes Prior Therapy Dates: unknown, Distant past  Prior Therapy Facilty/Provider(s): [facility] Reason for Treatment: Detox  Prior Outpatient Therapy Prior Outpatient Therapy: Yes Prior Therapy Dates: current, last month  Prior Therapy Facilty/Provider(s): [facility] Reason for Treatment: Biploar  Does patient have an ACCT team?: No Does patient have Intensive In-House Services?  : No Does patient have [facility] services? : No Does patient have P4CC services?: No  ADL Screening (condition at time of admission) Patient's cognitive ability adequate to safely complete daily activities?: Yes Patient able to express need for assistance with ADLs?: Yes Independently performs ADLs?: Yes (appropriate for developmental age)       Abuse/Neglect Assessment (Assessment to be complete while patient is alone) Physical Abuse: Denies Verbal Abuse: Denies Sexual Abuse: Denies Exploitation of patient/patient's resources: Denies Self-Neglect: Denies Values / Beliefs Cultural Requests During Hospitalization: None Spiritual Requests During Hospitalization: None Consults Spiritual Care Consult Needed: No Social Work Consult Needed: No Merchant navy officer (For Healthcare) Does Patient Have a Medical Advance Directive?: No Would patient like information on creating a medical advance directive?: No - Patient declined    Additional Information 1:1 In Past 12 Months?: No CIRT Risk:  No Elopement Risk: No  Does patient have medical clearance?: Yes     Disposition:  Disposition Disposition of Patient: Other dispositions Other disposition(s): Other (Comment) (precautionary hold overnight )  On Site Evaluation by:   Reviewed with Physician:    Asa Saunas 11/03/2016 6:49 PM

## 2016-11-03 NOTE — ED Notes (Signed)
Pt has jeans, tshirt, socks, and underwear in belongings bag, wallet with coins

## 2016-11-03 NOTE — ED Notes (Signed)
Hourly rounding reveals patient sleeping in room. No complaints, stable, in no acute distress. Q15 minute rounds and monitoring via Security Cameras to continue. 

## 2016-11-03 NOTE — ED Notes (Signed)
Pt. Transferred to [facility] from ED to room 1. Report to include Situation, Background, Assessment and Recommendations from Raquel RN. Pt. Oriented to unit including Q15 minute rounds as well as the security cameras for their protection. Patient is alert and oriented, warm and dry in no acute distress. Patient denies SI, HI, and AVH. Pt. Encouraged to let me know if needs arise.

## 2016-11-03 NOTE — ED Provider Notes (Signed)
EKG: Interpreted by me, sinus rhythm rate 94 bpm, normal PR interval, normal QRS, normal QT, normal axis.   Ernest FilbertJonathan E Jerricka Carvey, MD 11/03/16 [phone]

## 2016-11-03 NOTE — ED Notes (Signed)
[phone], mother

## 2016-11-03 NOTE — Consult Note (Signed)
[place] Psychiatry Consult   Reason for Consult:  Consult for 51 year old man brought into the hospital with confusion Referring Physician:  Quentin Cornwall Patient Identification: Ernest Romero MRN:  [id number] Principal Diagnosis: Acute delirium Diagnosis:   Patient Active Problem List   Diagnosis Date Noted  . Alcohol abuse [F10.10] 11/03/2016  . Alcohol intoxication ([place]) [F10.929] 11/03/2016  . 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 Bipolar 1 disorder ([place]) [F31.9] 05/30/2016  . Opiate abuse, episodic [F11.10] 05/30/2016  . Benzodiazepine withdrawal ([place]) [F13.239] 05/30/2016  . Opiate withdrawal ([place]) [F11.23] 05/30/2016  . Acute delirium [R41.0] 05/29/2016    Total Time spent with patient: 1 hour  Subjective:   Ernest Romero is a 51 y.o. male patient admitted with "I fell down".  HPI:  51 year old man was brought to the hospital after his roommate called 911. Patient says that he fell down the front steps of his trailer today. Reportedly he was confused and agitated when emergency services came to assist him. By the time I came to see him he was agitated but did not appear to be frankly delirious. He tells me that he has been on an alcohol binge for the last day or 2. He had been drinking this morning and estimates about a 6 pack over the last day. He had a blood alcohol level elevated above 100 when he came to the emergency room. Additionally the patient says that he has taken some stacker2 pills. These aren't over-the-counter vitamin supplement which contains a high dose of caffeine. He denies that he's been abusing any other drugs. He says he has been taking his psychiatric medicine prescribed by his outpatient doctor which is lithium, Latuda and Ativan. Patient denies any recent depression. Denies suicidal thoughts. Denies any hallucinations.  Social history: Lives with a roommate. Not working.  Medical history: Patient has a history of abuse of multiple other drugs but also  just as high blood pressure.  Substance abuse history: History of abuse of several things including opiates stimulants alcohol. He denies any history of delirium tremens or seizures although he has gotten pretty jittery when he comes off alcohol before.  Past Psychiatric History: Patient is diagnosed with bipolar disorder type I. He sees Dr. Kasandra Knudsen. He is on 40 mg a day of Latuda, 450 mg at night of lithium and Ativan 1 mg twice a day. He denies any past suicide attempts. He can't recall any episodes of hallucinations in the past. Most of our contact with him previously has been around substance abuse.  Risk to Self: Is patient at risk for suicide?: No Risk to Others:   Prior Inpatient Therapy:   Prior Outpatient Therapy:    Past Medical History:  Past Medical History:  Diagnosis Date  . Anxiety   . Bipolar 1 disorder ([place])   . Hypertension    History reviewed. No pertinent surgical history. Family History: No family history on file. Family Psychiatric  History: His father committed suicide. He does not know a diagnosis. Social History:  History  Alcohol Use  . 14.4 oz/week  . 24 Cans of beer per week    Comment: unknown amount or last drink     History  Drug Use    Comment: unknown    Social History   Social History  . Marital status: Single    Spouse name: N/A  . Number of children: N/A  . Years of education: N/A   Social History Main Topics  . Smoking status: Former Research scientist (life sciences)  .  Smokeless tobacco: Current User  . Alcohol use 14.4 oz/week    24 Cans of beer per week     Comment: unknown amount or last drink  . Drug use: Yes     Comment: unknown  . Sexual activity: No   Other Topics Concern  . None   Social History Narrative  . None   Additional Social History:    Allergies:  No Known Allergies  Labs:  Results for orders placed or performed during the hospital encounter of 11/03/16 (from the past 48 hour(s))  Basic metabolic panel     Status: Abnormal    Collection Time: 11/03/16  3:14 PM  Result Value Ref Range   Sodium 141 135 - 145 mmol/L   Potassium 3.4 (L) 3.5 - 5.1 mmol/L   Chloride 108 101 - 111 mmol/L   CO2 18 (L) 22 - 32 mmol/L   Glucose, Bld 100 (H) 65 - 99 mg/dL   BUN 15 6 - 20 mg/dL   Creatinine, Ser 1.43 (H) 0.61 - 1.24 mg/dL   Calcium 9.5 8.9 - 10.3 mg/dL   GFR calc non Af Amer 55 (L) >60 mL/min   GFR calc Af Amer >60 >60 mL/min    Comment: (NOTE) The eGFR has been calculated using the CKD EPI equation. This calculation has not been validated in all clinical situations. eGFR's persistently <60 mL/min signify possible Chronic Kidney Disease.    Anion gap 15 5 - 15  CBC     Status: Abnormal   Collection Time: 11/03/16  3:14 PM  Result Value Ref Range   WBC 13.5 (H) 3.8 - 10.6 K/uL   RBC 5.40 4.40 - 5.90 MIL/uL   Hemoglobin 16.3 13.0 - 18.0 g/dL   HCT 48.3 40.0 - 52.0 %   MCV 89.5 80.0 - 100.0 fL   MCH 30.2 26.0 - 34.0 pg   MCHC 33.8 32.0 - 36.0 g/dL   RDW 12.7 11.5 - 14.5 %   Platelets 287 150 - 440 K/uL  Troponin I     Status: None   Collection Time: 11/03/16  3:14 PM  Result Value Ref Range   Troponin I <0.03 <0.03 ng/mL  Ethanol     Status: Abnormal   Collection Time: 11/03/16  3:14 PM  Result Value Ref Range   Alcohol, Ethyl (B) 148 (H) <5 mg/dL    Comment:        LOWEST DETECTABLE LIMIT FOR SERUM ALCOHOL IS 5 mg/dL FOR MEDICAL PURPOSES ONLY   Salicylate level     Status: None   Collection Time: 11/03/16  3:14 PM  Result Value Ref Range   Salicylate Lvl <8.3 2.8 - 30.0 mg/dL  Acetaminophen level     Status: Abnormal   Collection Time: 11/03/16  3:14 PM  Result Value Ref Range   Acetaminophen (Tylenol), Serum <10 (L) 10 - 30 ug/mL    Comment:        THERAPEUTIC CONCENTRATIONS VARY SIGNIFICANTLY. A RANGE OF 10-30 ug/mL MAY BE AN EFFECTIVE CONCENTRATION FOR MANY PATIENTS. HOWEVER, SOME ARE BEST TREATED AT CONCENTRATIONS OUTSIDE THIS RANGE. ACETAMINOPHEN CONCENTRATIONS >150 ug/mL AT 4 HOURS  AFTER INGESTION AND >50 ug/mL AT 12 HOURS AFTER INGESTION ARE OFTEN ASSOCIATED WITH TOXIC REACTIONS.   Lithium level     Status: Abnormal   Collection Time: 11/03/16  3:14 PM  Result Value Ref Range   Lithium Lvl 0.33 (L) 0.60 - 1.20 mmol/L    Current Facility-Administered Medications  Medication Dose Route Frequency Provider  Last Rate Last Dose  . folic acid (FOLVITE) tablet 1 mg  1 mg Oral Daily Josealberto T Clapacs, MD      . haloperidol lactate (HALDOL) injection 5 mg  5 mg Intramuscular Once Merlyn Lot, MD      . lithium carbonate (ESKALITH) CR tablet 450 mg  450 mg Oral QHS Gonzella Lex, MD      . LORazepam (ATIVAN) tablet 1 mg  1 mg Oral Q6H PRN Gonzella Lex, MD       Or  . LORazepam (ATIVAN) injection 1 mg  1 mg Intravenous Q6H PRN Gonzella Lex, MD      . LORazepam (ATIVAN) tablet 1 mg  1 mg Oral Q4H PRN Merlyn Lot, MD      . LORazepam (ATIVAN) tablet 1 mg  1 mg Oral BID Gonzella Lex, MD      . LORazepam (ATIVAN) tablet 2 mg  2 mg Oral STAT Gonzella Lex, MD      . Derrill Memo ON 11/04/2016] lurasidone (LATUDA) tablet 40 mg  40 mg Oral Q breakfast Gonzella Lex, MD      . multivitamin with minerals tablet 1 tablet  1 tablet Oral Daily Thanos T Clapacs, MD      . thiamine (VITAMIN B-1) tablet 100 mg  100 mg Oral Daily Gonzella Lex, MD       Or  . thiamine (B-1) injection 100 mg  100 mg Intravenous Daily Gonzella Lex, MD       Current Outpatient Prescriptions  Medication Sig Dispense Refill  . amLODipine (NORVASC) 5 MG tablet Take 5 mg by mouth daily.  3  . LATUDA 40 MG TABS tablet Take 1 tablet (40 mg total) by mouth daily. 30 tablet 0  . lithium carbonate (ESKALITH) 450 MG CR tablet Take 1 tablet (450 mg total) by mouth at bedtime. 30 tablet 0  . ondansetron (ZOFRAN ODT) 4 MG disintegrating tablet Take 1 tablet (4 mg total) by mouth every 8 (eight) hours as needed for nausea or vomiting. (Patient not taking: Reported on 05/29/2016) 20 tablet 0  . risperiDONE  (RISPERDAL) 3 MG tablet Take 3 mg by mouth 2 (two) times daily.     . SUBOXONE 8-2 MG FILM Place 1.5 strips under the tongue daily.  1    Musculoskeletal: Strength & Muscle Tone: within normal limits Gait & Station: shuffle Patient leans: N/A  Psychiatric Specialty Exam: Physical Exam  Nursing note and vitals reviewed. Constitutional: He appears well-developed and well-nourished. He appears distressed.  HENT:  Head: Normocephalic and atraumatic.  Eyes: Conjunctivae are normal. Pupils are equal, round, and reactive to light.  Neck: Normal range of motion.  Cardiovascular: Regular rhythm and normal heart sounds.   Respiratory: Effort normal.  GI: Soft.  Musculoskeletal: Normal range of motion.  Neurological: He is alert. He displays tremor.  Skin: Skin is warm. He is diaphoretic.  Psychiatric: His mood appears anxious. His affect is blunt. His speech is delayed. He is withdrawn. Thought content is not paranoid. Cognition and memory are impaired. He expresses impulsivity. He expresses no homicidal and no suicidal ideation.    Review of Systems  Constitutional: Positive for diaphoresis.  HENT: Negative.   Eyes: Negative.   Respiratory: Negative.   Cardiovascular: Negative.   Gastrointestinal: Negative.   Musculoskeletal: Positive for falls.  Skin: Negative.   Neurological: Positive for tremors.  Psychiatric/Behavioral: Positive for substance abuse. Negative for depression, hallucinations, memory loss and suicidal ideas. The  patient is nervous/anxious and has insomnia.     Blood pressure (!) 141/91, pulse 97, temperature 97.7 F (36.5 C), temperature source Oral, resp. rate (!) 24, height '5\' 10"'$  (1.778 m), weight 90.7 kg (200 lb), SpO2 98 %.Body mass index is 28.7 kg/m.  General Appearance: Disheveled and Dripping sweat. Staring off into the distance. Very jittery and can't sit still.  Eye Contact:  Minimal  Speech:  Garbled and Slow  Volume:  Decreased  Mood:  Anxious   Affect:  Congruent  Thought Process:  Goal Directed  Orientation:  Full (Time, Place, and Person)  Thought Content:  Tangential  Suicidal Thoughts:  No  Homicidal Thoughts:  No  Memory:  Immediate;   Good Recent;   Fair Remote;   Fair  Judgement:  Impaired  Insight:  Fair  Psychomotor Activity:  Restlessness, Shuffling Gait and Tremor  Concentration:  Concentration: Fair  Recall:  [employer] of Knowledge:  Fair  Language:  Fair  Akathisia:  No  Handed:  Right  AIMS (if indicated):     Assets:  Housing Resilience  ADL's:  Impaired  Cognition:  Impaired,  Mild  Sleep:        Treatment Plan Summary: Daily contact with patient to assess and evaluate symptoms and progress in treatment, Medication management and Plan 51 year old man with a history of bipolar disorder and substance abuse. Current condition was described as very confused when they first brought him in. By the time we got to see him he didn't seem particularly confused and I wouldn't describe him as really being delirious although he still seems to be intoxicated. I do not think that this is likely to be an aspect of bipolar disorder but intoxication with alcohol and high doses of caffeine and possibly other drugs. The drug screen is still pending. I recommend continuing his usual outpatient psychiatric medicine and giving him some Ativan now along with detox protocol to help him to detox and get through the night. We will reevaluate tomorrow at which time I expect he is likely to be ready for discharge.  Disposition: Patient does not meet criteria for psychiatric inpatient admission. Supportive therapy provided about ongoing stressors. Discussed crisis plan, support from social network, calling 911, coming to the Emergency Department, and calling Suicide Hotline.  Alethia Berthold, MD 11/03/2016 6:22 PM

## 2016-11-03 NOTE — ED Notes (Signed)
Pt asked to use the bathroom, pt walked to toilet, pt then states "I dont have to go, I am out in the middle of the hallway, I cant take it", pt then escalated, yelling profanity in hallway, stating he was leaving, EDP brought to bedside, orders for IM haldol received, pt yelling for food, pt given chicken sandwich and sprite, medication given

## 2016-11-03 NOTE — ED Notes (Signed)
After 1st event pt sat back down in bed, minutes later pt got up again, stating "I cant do this", pt yelling and crying, pt stated he wanted a room, pt placed in rm 24, pt continues to get up and walk out of room, EDP made aware, IV removed

## 2016-11-03 NOTE — ED Notes (Signed)
Pt dressed out at this time with tech and ODS

## 2016-11-03 NOTE — ED Provider Notes (Signed)
[facility] Emergency Department Provider Note    First MD Initiated Contact with Patient 11/03/16 1525     (approximate)  I have reviewed the triage vital signs and the nursing notes.   HISTORY  Chief Complaint Fall    HPI Ernest Romero is a 51 y.o. male presents after ingesting several alcoholic beverages today walking outside of his trailer and tripping and falling down 3-4 steps onto his right hip wrist. Patient uncertain as to whether there is also consciousness or feeling in his head. Patient states he feels "woozy "this time. States he also took 2 Animator"stacker "pills this a.m. States he drank at least sixpack every other day. Last beverages around 2:00 today. Denies any chest pain. No abdominal pain. No nausea or vomiting. Denies any SI or HI.   Past Medical History:  Diagnosis Date  . Anxiety   . Bipolar 1 disorder (HCC)   . Hypertension    No family history on file. History reviewed. No pertinent surgical history. Patient Active Problem List   Diagnosis Date Noted  . Bipolar 1 disorder (HCC) 05/30/2016  . Opiate abuse, episodic 05/30/2016  . Benzodiazepine withdrawal (HCC) 05/30/2016  . Opiate withdrawal (HCC) 05/30/2016  . Acute delirium 05/29/2016      Prior to Admission medications   Medication Sig Start Date End Date Taking? Authorizing Provider  amLODipine (NORVASC) 5 MG tablet Take 5 mg by mouth daily. 05/09/16   Historical Provider, MD  LATUDA 40 MG TABS tablet Take 1 tablet (40 mg total) by mouth daily. 06/01/16   Houston SirenVivek J Sainani, MD  lithium carbonate (ESKALITH) 450 MG CR tablet Take 1 tablet (450 mg total) by mouth at bedtime. 06/01/16   Houston SirenVivek J Sainani, MD  ondansetron (ZOFRAN ODT) 4 MG disintegrating tablet Take 1 tablet (4 mg total) by mouth every 8 (eight) hours as needed for nausea or vomiting. Patient not taking: Reported on 05/29/2016 02/07/16   Sharman CheekPhillip Stafford, MD  risperiDONE (RISPERDAL) 3 MG tablet Take 3 mg by mouth 2 (two)  times daily.     Historical Provider, MD  SUBOXONE 8-2 MG FILM Place 1.5 strips under the tongue daily. 04/28/16   Historical Provider, MD    Allergies Patient has no known allergies.    Social History Social History  Substance Use Topics  . Smoking status: Former Games developermoker  . Smokeless tobacco: Current User  . Alcohol use 14.4 oz/week    24 Cans of beer per week     Comment: unknown amount or last drink    Review of Systems Patient denies headaches, rhinorrhea, blurry vision, numbness, shortness of breath, chest pain, edema, cough, abdominal pain, nausea, vomiting, diarrhea, dysuria, fevers, rashes or hallucinations unless otherwise stated above in HPI. ____________________________________________   PHYSICAL EXAM:  VITAL SIGNS: Vitals:   11/03/16 1452  BP: (!) 141/91  Pulse: 99  Resp: (!) 24  Temp: 97.7 F (36.5 C)    Constitutional: Alert and oriented. Intoxicated but in no acute distress. Eyes: Conjunctivae are normal. PERRL. EOMI. Head: Atraumatic. Nose: No congestion/rhinnorhea. Mouth/Throat: Mucous membranes are moist.  Oropharynx non-erythematous.  No tongue wag Neck: No stridor. Painless ROM. No cervical spine tenderness to palpation Hematological/Lymphatic/Immunilogical: No cervical lymphadenopathy. Cardiovascular: Normal rate, regular rhythm. Grossly normal heart sounds.  Good peripheral circulation. Respiratory: Normal respiratory effort.  No retractions. Lungs CTAB. Gastrointestinal: Soft and nontender. No distention. No abdominal bruits. No CVA tenderness. Musculoskeletal: No lower extremity tenderness nor edema.  No joint effusions. Neurologic:  Normal speech and language. No gross focal neurologic deficits are appreciated. No resting tremor at this time. patient with slightly unsteady gait but likely 2/2 etoh use Skin:  Skin is warm, dry and intact. No rash noted. Psychiatric: Mood and affect are normal. Speech and behavior are  normal.  ____________________________________________   LABS (all labs ordered are listed, but only abnormal results are displayed)  Results for orders placed or performed during the hospital encounter of 11/03/16 (from the past 24 hour(s))  CBC     Status: Abnormal   Collection Time: 11/03/16  3:14 PM  Result Value Ref Range   WBC 13.5 (H) 3.8 - 10.6 K/uL   RBC 5.40 4.40 - 5.90 MIL/uL   Hemoglobin 16.3 13.0 - 18.0 g/dL   HCT 16.1 09.6 - 04.5 %   MCV 89.5 80.0 - 100.0 fL   MCH 30.2 26.0 - 34.0 pg   MCHC 33.8 32.0 - 36.0 g/dL   RDW 40.9 81.1 - 91.4 %   Platelets 287 150 - 440 K/uL   ____________________________________________  EKG no ischemia ____________________________________________  RADIOLOGY  I personally reviewed all radiographic images ordered to evaluate for the above acute complaints and reviewed radiology reports and findings.  These findings were personally discussed with the patient.  Please see medical record for radiology report.  ____________________________________________   PROCEDURES  Procedure(s) performed:  Procedures    Critical Care performed: no ____________________________________________   INITIAL IMPRESSION / ASSESSMENT AND PLAN / ED COURSE  Pertinent labs & imaging results that were available during my care of the patient were reviewed by me and considered in my medical decision making (see chart for details).  DDX: sah, sdh, edh, fracture, contusion, soft tissue injury, viscous injury, concussion, hemorrhage   Ernest Romero is a 51 y.o. who presents to the ED with fall from standing and alcohol intoxication. Patient afebrile and otherwise hemodynamically stable. Will order CT and radiographic imaging to evaluate for traumatic injury. Patient without any SI or Hi.  Patient is anxious but also appears intoxicated. We'll provide Ativan for anxiety. The patient will be placed on continuous pulse oximetry and telemetry for monitoring.   Laboratory evaluation will be sent to evaluate for the above complaints.     Clinical Course as of Nov 05 3  Thu Nov 03, 2016  1600 Patient with mild metabolic acidosis. Will provide IV fluids. Alcohol is elevated to 148.  Continue to monitor.  [PR]  1612 CT head with NAICA  [PR]  1719 Lithium level is subtherapeutic. Patient was made an IVC due to erratic behavior not consistent with just alcohol intoxication.  Chest x-ray with nondisplaced rib fractures. Patient without any significant respiratory distress. These may be old however they do appear acute. No significant tenderness on exam. Do not feel further imaging clinically indicated at this time. Continue to monitor.   I'm concerned for underlying psychosis as the patient does appear very disorganized.  [PR]    Clinical Course User Index [PR] Willy Eddy, MD    Patient has been evaluated by psychiatry at bedside. Not felt to meet any inpatient criteria at this time. Patient denies any SI or HI therefore does not meet criteria for IVC. Patient is still intoxicated however and do feel patient would benefit from further observation for clinical sobriety. ____________________________________________   FINAL CLINICAL IMPRESSION(S) / ED DIAGNOSES  Final diagnoses:  Alcohol abuse  Anxiety  Fall, initial encounter  Closed fracture of multiple ribs of left side, initial encounter  NEW MEDICATIONS STARTED DURING THIS VISIT:  New Prescriptions   No medications on file     Note:  This document was prepared using Dragon voice recognition software and may include unintentional dictation errors.    Willy Eddy, MD 11/04/16 [phone]

## 2016-11-03 NOTE — ED Notes (Signed)
Patient's pinky finger cleaned with solution of half saline, half surgical scrub. Gauze bandage and bandaid applied.

## 2016-11-03 NOTE — ED Triage Notes (Signed)
Patient arrives to ER from home via [facility]. Patient lives in a trailer with 3-4 steps up to residence. Patient reports falling down his front wooden steps some time this afternoon (unable to give accurate history of timeline). Now c/o lower back pain. Patient arrives to ER very diaphoretic and red in the face. Per EMS, patient was drenched in sweat upon arrival to home. Patient reports taking "Stacker" pills from convenience store this am. Patient states he drinks 6 pack of beer every other day (last drink at approx 1400 today). Denies any seizure activity or withdrawal symptoms when not drinking. Patient denies drug use. When standing, patient is very wobbly on his feet. +Dizziness per patient.

## 2016-11-03 NOTE — ED Notes (Signed)
Per Consulting civil engineerCharge RN in ED,EDP requested we take patient because he does not want to discharge to waiting room. He is to leave in the AM once he finds a ride.

## 2016-11-04 DIAGNOSIS — F1510 Other stimulant abuse, uncomplicated: Secondary | ICD-10-CM | POA: Diagnosis not present

## 2016-11-04 DIAGNOSIS — F1010 Alcohol abuse, uncomplicated: Secondary | ICD-10-CM | POA: Diagnosis not present

## 2016-11-04 DIAGNOSIS — F10929 Alcohol use, unspecified with intoxication, unspecified: Secondary | ICD-10-CM | POA: Diagnosis not present

## 2016-11-04 MED ORDER — OXYCODONE-ACETAMINOPHEN 5-325 MG PO TABS: 1.0000 | ORAL_TABLET | Freq: Four times a day (QID) | ORAL | 0 refills | Status: DC | PRN

## 2016-11-04 NOTE — ED Notes (Signed)
Hourly rounding reveals patient in room. See CIWA. Q15 minute rounds and monitoring via [employer] to continue.

## 2016-11-04 NOTE — ED Notes (Signed)
Hourly rounding reveals patient sleeping in room. No complaints, stable, in no acute distress. Q15 minute rounds and monitoring via Security Cameras to continue. 

## 2016-11-04 NOTE — ED Notes (Signed)
Report to oncoming staff. 

## 2016-11-04 NOTE — Consult Note (Signed)
[facility] Consult   Reason for Consult:  Consult for 51 year old man brought into the hospital with confusion Referring Physician:  Quentin Cornwall Patient Identification: Ernest Romero MRN:  [id number] Principal Diagnosis: Acute delirium Diagnosis:   Patient Active Problem List   Diagnosis Date Noted  . Alcohol abuse [F10.10] 11/03/2016  . Alcohol intoxication ([place]) [F10.929] 11/03/2016  . 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 Bipolar 1 disorder ([place]) [F31.9] 05/30/2016  . Opiate abuse, episodic [F11.10] 05/30/2016  . Benzodiazepine withdrawal ([place]) [F13.239] 05/30/2016  . Opiate withdrawal ([place]) [F11.23] 05/30/2016  . Acute delirium [R41.0] 05/29/2016    Total Time spent with patient: 20 minutes  Subjective:   Ernest Romero is a 51 y.o. male patient admitted with "I fell down".  Follow-up note for Friday the second. 51 year old man with a history of alcohol and drug abuse. Came to the emergency room yesterday intoxicated and confused. On reevaluation today he is awake alert and oriented. No sign of delirium. Denies any hallucinations. Calm. Still blunted affect. No sign of any dangerousness.  HPI:  51 year old man was brought to the hospital after his roommate called 911. Patient says that he fell down the front steps of his trailer today. Reportedly he was confused and agitated when emergency services came to assist him. By the time I came to see him he was agitated but did not appear to be frankly delirious. He tells me that he has been on an alcohol binge for the last day or 2. He had been drinking this morning and estimates about a 6 pack over the last day. He had a blood alcohol level elevated above 100 when he came to the emergency room. Additionally the patient says that he has taken some stacker2 pills. These aren't over-the-counter vitamin supplement which contains a high dose of caffeine. He denies that he's been abusing any other drugs. He says he has been taking  his psychiatric medicine prescribed by his outpatient doctor which is lithium, Latuda and Ativan. Patient denies any recent depression. Denies suicidal thoughts. Denies any hallucinations.  Social history: Lives with a roommate. Not working.  Medical history: Patient has a history of abuse of multiple other drugs but also just as high blood pressure.  Substance abuse history: History of abuse of several things including opiates stimulants alcohol. He denies any history of delirium tremens or seizures although he has gotten pretty jittery when he comes off alcohol before.  Past Psychiatric History: Patient is diagnosed with bipolar disorder type I. He sees Dr. Kasandra Knudsen. He is on 40 mg a day of Latuda, 450 mg at night of lithium and Ativan 1 mg twice a day. He denies any past suicide attempts. He can't recall any episodes of hallucinations in the past. Most of our contact with him previously has been around substance abuse.  Risk to Self: Suicidal Ideation: No Suicidal Intent: No Is patient at risk for suicide?: No Suicidal Plan?: No Access to Means: No What has been your use of drugs/alcohol within the last 12 months?: Alcohol BAL 148 How many times?: 0 Other Self Harm Risks: 0 Triggers for Past Attempts: Other (Comment) Intentional Self Injurious Behavior: None Risk to Others: Homicidal Ideation: No Thoughts of Harm to Others: No Current Homicidal Intent: No-Not Currently/Within Last 6 Months Current Homicidal Plan: No Access to Homicidal Means: No Identified Victim: n/ History of harm to others?: No Assessment of Violence: On admission Violent Behavior Description: None  Does patient have access to weapons?: No Criminal Charges  Pending?: No Does patient have a court date: No Prior Inpatient Therapy: Prior Inpatient Therapy: Yes Prior Therapy Dates: unknown, Distant past  Prior Therapy Facilty/Provider(s): [facility] Reason for Treatment: [facility] Prior Outpatient Therapy: Prior Outpatient  Therapy: Yes Prior Therapy Dates: current, last month  Prior Therapy Facilty/Provider(s): [facility] Reason for Treatment: [facility]  Does patient have an ACCT team?: No Does patient have Intensive In-House Services?  : No Does patient have [facility] services? : No Does patient have P4CC services?: No  Past Medical History:  Past Medical History:  Diagnosis Date  . Anxiety   . Bipolar 1 disorder ([place])   . Hypertension    History reviewed. No pertinent surgical history. Family History: No family history on file. Family Psychiatric  History: His father committed suicide. He does not know a diagnosis. Social History:  History  Alcohol Use  . 14.4 oz/week  . 24 Cans of beer per week    Comment: unknown amount or last drink     History  Drug Use    Comment: unknown    Social History   Social History  . Marital status: Single    Spouse name: N/A  . Number of children: N/A  . Years of education: N/A   Social History Main Topics  . Smoking status: Former Research scientist (life sciences)  . Smokeless tobacco: Current User  . Alcohol use 14.4 oz/week    24 Cans of beer per week     Comment: unknown amount or last drink  . Drug use: Yes     Comment: unknown  . Sexual activity: No   Other Topics Concern  . None   Social History Narrative  . None   Additional Social History:    Allergies:  No Known Allergies  Labs:  Results for orders placed or performed during the hospital encounter of 11/03/16 (from the past 48 hour(s))  Basic metabolic panel     Status: Abnormal   Collection Time: 11/03/16  3:14 PM  Result Value Ref Range   Sodium 141 135 - 145 mmol/L   Potassium 3.4 (L) 3.5 - 5.1 mmol/L   Chloride 108 101 - 111 mmol/L   CO2 18 (L) 22 - 32 mmol/L   Glucose, Bld 100 (H) 65 - 99 mg/dL   BUN 15 6 - 20 mg/dL   Creatinine, Ser 1.43 (H) 0.61 - 1.24 mg/dL   Calcium 9.5 8.9 - 10.3 mg/dL   GFR calc non Af Amer 55 (L) >60 mL/min   GFR calc Af Amer >60 >60 mL/min    Comment: (NOTE) The eGFR has been  calculated using the CKD EPI equation. This calculation has not been validated in all clinical situations. eGFR's persistently <60 mL/min signify possible Chronic Kidney Disease.    Anion gap 15 5 - 15  CBC     Status: Abnormal   Collection Time: 11/03/16  3:14 PM  Result Value Ref Range   WBC 13.5 (H) 3.8 - 10.6 K/uL   RBC 5.40 4.40 - 5.90 MIL/uL   Hemoglobin 16.3 13.0 - 18.0 g/dL   HCT 48.3 40.0 - 52.0 %   MCV 89.5 80.0 - 100.0 fL   MCH 30.2 26.0 - 34.0 pg   MCHC 33.8 32.0 - 36.0 g/dL   RDW 12.7 11.5 - 14.5 %   Platelets 287 150 - 440 K/uL  Troponin I     Status: None   Collection Time: 11/03/16  3:14 PM  Result Value Ref Range   Troponin I <0.03 <0.03 ng/mL  Ethanol     Status: Abnormal   Collection Time: 11/03/16  3:14 PM  Result Value Ref Range   Alcohol, Ethyl (B) 148 (H) <5 mg/dL    Comment:        LOWEST DETECTABLE LIMIT FOR SERUM ALCOHOL IS 5 mg/dL FOR MEDICAL PURPOSES ONLY   Salicylate level     Status: None   Collection Time: 11/03/16  3:14 PM  Result Value Ref Range   Salicylate Lvl <3.5 2.8 - 30.0 mg/dL  Acetaminophen level     Status: Abnormal   Collection Time: 11/03/16  3:14 PM  Result Value Ref Range   Acetaminophen (Tylenol), Serum <10 (L) 10 - 30 ug/mL    Comment:        THERAPEUTIC CONCENTRATIONS VARY SIGNIFICANTLY. A RANGE OF 10-30 ug/mL MAY BE AN EFFECTIVE CONCENTRATION FOR MANY PATIENTS. HOWEVER, SOME ARE BEST TREATED AT CONCENTRATIONS OUTSIDE THIS RANGE. ACETAMINOPHEN CONCENTRATIONS >150 ug/mL AT 4 HOURS AFTER INGESTION AND >50 ug/mL AT 12 HOURS AFTER INGESTION ARE OFTEN ASSOCIATED WITH TOXIC REACTIONS.   Lithium level     Status: Abnormal   Collection Time: 11/03/16  3:14 PM  Result Value Ref Range   Lithium Lvl 0.33 (L) 0.60 - 1.20 mmol/L    Current Facility-Administered Medications  Medication Dose Route Frequency Provider Last Rate Last Dose  . folic acid (FOLVITE) tablet 1 mg  1 mg Oral Daily Gonzella Lex, MD   1 mg at  11/04/16 0908  . haloperidol lactate (HALDOL) injection 5 mg  5 mg Intramuscular Once Merlyn Lot, MD      . lithium carbonate (ESKALITH) CR tablet 450 mg  450 mg Oral QHS Gonzella Lex, MD   450 mg at 11/03/16 2225  . LORazepam (ATIVAN) tablet 1 mg  1 mg Oral Q6H PRN Gonzella Lex, MD   1 mg at 11/04/16 5974   Or  . LORazepam (ATIVAN) injection 1 mg  1 mg Intravenous Q6H PRN Gonzella Lex, MD      . LORazepam (ATIVAN) tablet 1 mg  1 mg Oral Q4H PRN Merlyn Lot, MD      . LORazepam (ATIVAN) tablet 1 mg  1 mg Oral BID Merlyn Lot, MD   1 mg at 11/04/16 0908  . lurasidone (LATUDA) tablet 40 mg  40 mg Oral Q breakfast Gonzella Lex, MD   40 mg at 11/04/16 0908  . multivitamin with minerals tablet 1 tablet  1 tablet Oral Daily Gonzella Lex, MD   1 tablet at 11/04/16 0908  . thiamine (VITAMIN B-1) tablet 100 mg  100 mg Oral Daily Gonzella Lex, MD   100 mg at 11/04/16 1638   Or  . thiamine (B-1) injection 100 mg  100 mg Intravenous Daily Gonzella Lex, MD       Current Outpatient Prescriptions  Medication Sig Dispense Refill  . amLODipine (NORVASC) 5 MG tablet Take 5 mg by mouth daily.  3  . LATUDA 40 MG TABS tablet Take 1 tablet (40 mg total) by mouth daily. 30 tablet 0  . lithium carbonate (ESKALITH) 450 MG CR tablet Take 1 tablet (450 mg total) by mouth at bedtime. 30 tablet 0  . ondansetron (ZOFRAN ODT) 4 MG disintegrating tablet Take 1 tablet (4 mg total) by mouth every 8 (eight) hours as needed for nausea or vomiting. (Patient not taking: Reported on 05/29/2016) 20 tablet 0  . oxyCODONE-acetaminophen (ROXICET) 5-325 MG tablet Take 1 tablet by mouth every 6 (six)  hours as needed. 12 tablet 0  . risperiDONE (RISPERDAL) 3 MG tablet Take 3 mg by mouth 2 (two) times daily.     . SUBOXONE 8-2 MG FILM Place 1.5 strips under the tongue daily.  1    Musculoskeletal: Strength & Muscle Tone: within normal limits Gait & Station: shuffle Patient leans: N/A  Psychiatric  Specialty Exam: Physical Exam  Nursing note and vitals reviewed. Constitutional: He appears well-developed and well-nourished. No distress.  HENT:  Head: Normocephalic and atraumatic.  Eyes: Conjunctivae are normal. Pupils are equal, round, and reactive to light.  Neck: Normal range of motion.  Cardiovascular: Regular rhythm and normal heart sounds.   Respiratory: Effort normal.  GI: Soft.  Musculoskeletal: Normal range of motion.  Neurological: He is alert. He displays tremor.  Skin: Skin is warm. He is diaphoretic.  Psychiatric: His mood appears anxious. His affect is blunt. His speech is delayed. He is withdrawn. Thought content is not paranoid. Cognition and memory are impaired. He expresses impulsivity. He expresses no homicidal and no suicidal ideation.    Review of Systems  Constitutional: Positive for diaphoresis.  HENT: Negative.   Eyes: Negative.   Respiratory: Negative.   Cardiovascular: Negative.   Gastrointestinal: Negative.   Musculoskeletal: Positive for falls.  Skin: Negative.   Neurological: Positive for tremors.  Psychiatric/Behavioral: Positive for substance abuse. Negative for depression, hallucinations, memory loss and suicidal ideas. The patient is nervous/anxious and has insomnia.     Blood pressure (!) 150/100, pulse 98, temperature 97.7 F (36.5 C), temperature source Oral, resp. rate 16, height _0  (1.778 m), weight 90.7 kg (200 lb), SpO2 99 %.Body mass index is 28.7 kg/m.  General Appearance: Disheveled and Dripping sweat. Staring off into the distance. Very jittery and can't sit still.  Eye Contact:  Minimal  Speech:  Garbled and Slow  Volume:  Decreased  Mood:  Anxious  Affect:  Congruent  Thought Process:  Goal Directed  Orientation:  Full (Time, Place, and Person)  Thought Content:  Tangential  Suicidal Thoughts:  No  Homicidal Thoughts:  No  Memory:  Immediate;   Good Recent;   Fair Remote;   Fair  Judgement:  Impaired  Insight:  Fair   Psychomotor Activity:  Restlessness, Shuffling Gait and Tremor  Concentration:  Concentration: Fair  Recall:  [employer] of Knowledge:  Fair  Language:  Fair  Akathisia:  No  Handed:  Right  AIMS (if indicated):     Assets:  Housing Resilience  ADL's:  Impaired  Cognition:  Impaired,  Mild  Sleep:        Treatment Plan Summary: Daily contact with patient to assess and evaluate symptoms and progress in treatment, Medication management and Plan Patient with bipolar disorder and substance abuse. Now that he is sober there is no sign of major depression or manic symptoms or psychosis. Patient was counseled about the dangers of mixing alcohol and benzodiazepines and the dangers of adding any other drugs especially heavy doses of stimulants. Strongly encouraged to take a serious thought about getting off of the alcohol and drugs as well as following up with his outpatient psychiatrist. No longer acutely dangerous does not need hospital level treatment. Discontinue IVC. Case reviewed with TTS and emergency room physician.  Disposition: Patient does not meet criteria for psychiatric inpatient admission. Supportive therapy provided about ongoing stressors. Discussed crisis plan, support from social network, calling 911, coming to the Emergency Department, and calling Suicide Hotline.  Alethia Berthold, MD 11/04/2016  2:54 PM

## 2016-11-04 NOTE — ED Notes (Addendum)
Patient walked off the unit ambulatory and left with his mother; patient verbalized that he had all belongings; patient denies SI/HI and A/V hallucinations

## 2016-11-04 NOTE — ED Notes (Signed)
Dr. Alphonzo LemmingsMcShane made aware of the patient's vital signs

## 2016-11-04 NOTE — ED Provider Notes (Signed)
-----------------------------------------   7:06 AM on 11/04/2016 -----------------------------------------   Blood pressure (!) 143/99, pulse (!) 107, temperature 97.7 F (36.5 C), temperature source Oral, resp. rate 16, height 5\' 10"  (1.778 m), weight 200 lb (90.7 kg), SpO2 93 %.  The patient had no acute events since last update.  Currently sleeping in no acute distress. Anticipate discharge home once patient is sober and ambulatory. Care transferred to oncoming provider.   Irean HongJade J Sung, MD 11/04/16 [phone]

## 2016-12-28 ENCOUNTER — Encounter: Payer: Self-pay | Admitting: Emergency Medicine

## 2016-12-28 DIAGNOSIS — I10 Essential (primary) hypertension: Secondary | ICD-10-CM | POA: Diagnosis not present

## 2016-12-28 DIAGNOSIS — S3992XA Unspecified injury of lower back, initial encounter: Secondary | ICD-10-CM | POA: Diagnosis present

## 2016-12-28 DIAGNOSIS — Z87891 Personal history of nicotine dependence: Secondary | ICD-10-CM | POA: Insufficient documentation

## 2016-12-28 DIAGNOSIS — Y999 Unspecified external cause status: Secondary | ICD-10-CM | POA: Diagnosis not present

## 2016-12-28 DIAGNOSIS — Y92410 Unspecified street and highway as the place of occurrence of the external cause: Secondary | ICD-10-CM | POA: Diagnosis not present

## 2016-12-28 DIAGNOSIS — M545 Low back pain: Secondary | ICD-10-CM | POA: Diagnosis not present

## 2016-12-28 DIAGNOSIS — W1839XA Other fall on same level, initial encounter: Secondary | ICD-10-CM | POA: Insufficient documentation

## 2016-12-28 DIAGNOSIS — Y9301 Activity, walking, marching and hiking: Secondary | ICD-10-CM | POA: Diagnosis not present

## 2016-12-28 NOTE — ED Triage Notes (Signed)
Patient ambulatory to triage with steady gait, without difficulty or distress noted; pt reports fell while walking, "hit a rut"; c/o pain lower back; denies any other c/o or injuries

## 2016-12-29 ENCOUNTER — Emergency Department: Payer: Medicaid Other

## 2016-12-29 ENCOUNTER — Emergency Department
Admission: EM | Admit: 2016-12-29 | Discharge: 2016-12-29 | Disposition: A | Discharge status: Home / Self Care | Payer: Medicaid Other | Attending: Emergency Medicine | Admitting: Emergency Medicine

## 2016-12-29 DIAGNOSIS — W19XXXA Unspecified fall, initial encounter: Secondary | ICD-10-CM

## 2016-12-29 DIAGNOSIS — M545 Low back pain, unspecified: Secondary | ICD-10-CM

## 2016-12-29 MED ORDER — DIAZEPAM 2 MG PO TABS
2.0000 mg | ORAL_TABLET | Freq: Once | ORAL | Status: AC
  Administered 2016-12-29: 2 mg via ORAL
  Filled 2016-12-29: qty 1

## 2016-12-29 MED ORDER — IBUPROFEN 800 MG PO TABS: 800.0000 mg | ORAL_TABLET | Freq: Three times a day (TID) | ORAL | 0 refills | Status: DC | PRN

## 2016-12-29 MED ORDER — KETOROLAC TROMETHAMINE 30 MG/ML IJ SOLN
60.0000 mg | Freq: Once | INTRAMUSCULAR | Status: AC
  Administered 2016-12-29: 60 mg via INTRAMUSCULAR
  Filled 2016-12-29: qty 2

## 2016-12-29 NOTE — Discharge Instructions (Signed)
1. You may take ibuprofen as needed for pain. 2. Return to the ER for worsening symptoms, persistent vomiting, difficulty breathing or other concerns.

## 2016-12-29 NOTE — ED Provider Notes (Signed)
[facility] Emergency Department Provider Note   ____________________________________________   First MD Initiated Contact with Patient 12/29/16 [phone]     (approximate)  I have reviewed the triage vital signs and the nursing notes.   HISTORY  Chief Complaint Fall    HPI Ernest Romero is a 51 y.o. male brought to the ED via EMS with a chief complaint of fall with low back pain. Patient reports frequent issues with his back "seizing up". Reports walking in the street when his back spasms and he fell into a pothole. Complains of low back pain. Denies associated extremity weakness, numbness/tingling, bowel or bladder incontinence. Denies striking head or LOC. Denies headache, vision changes, neck pain, chest pain, shortness of breath, Donald pain, nausea, vomiting. He makes his symptoms better. Movement makes his back hurt worse.   Past Medical History:  Diagnosis Date  . Anxiety   . Bipolar 1 disorder (HCC)   . Hypertension     Patient Active Problem List   Diagnosis Date Noted  . Alcohol abuse 11/03/2016  . Alcohol intoxication (HCC) 11/03/2016  . Stimulant abuse 11/03/2016  . Bipolar 1 disorder (HCC) 05/30/2016  . Opiate abuse, episodic 05/30/2016  . Benzodiazepine withdrawal (HCC) 05/30/2016  . Opiate withdrawal (HCC) 05/30/2016  . Acute delirium 05/29/2016    History reviewed. No pertinent surgical history.  Prior to Admission medications   Medication Sig Start Date End Date Taking? Authorizing Provider  amLODipine (NORVASC) 5 MG tablet Take 5 mg by mouth daily. 05/09/16   Historical Provider, MD  ibuprofen (ADVIL,MOTRIN) 800 MG tablet Take 1 tablet (800 mg total) by mouth every 8 (eight) hours as needed for moderate pain. 12/29/16   Irean Hong, MD  LATUDA 40 MG TABS tablet Take 1 tablet (40 mg total) by mouth daily. 06/01/16   Houston Siren, MD  lithium carbonate (ESKALITH) 450 MG CR tablet Take 1 tablet (450 mg total) by mouth at bedtime.  06/01/16   Houston Siren, MD  ondansetron (ZOFRAN ODT) 4 MG disintegrating tablet Take 1 tablet (4 mg total) by mouth every 8 (eight) hours as needed for nausea or vomiting. Patient not taking: Reported on 05/29/2016 02/07/16   Sharman Cheek, MD  oxyCODONE-acetaminophen (ROXICET) 5-325 MG tablet Take 1 tablet by mouth every 6 (six) hours as needed. 11/04/16   Jeanmarie Plant, MD  risperiDONE (RISPERDAL) 3 MG tablet Take 3 mg by mouth 2 (two) times daily.     Historical Provider, MD  SUBOXONE 8-2 MG FILM Place 1.5 strips under the tongue daily. 04/28/16   Historical Provider, MD    Allergies Patient has no known allergies.  No family history on file.  Social History Social History  Substance Use Topics  . Smoking status: Former Games developer  . Smokeless tobacco: Current User  . Alcohol use 14.4 oz/week    24 Cans of beer per week     Comment: unknown amount or last drink    Review of Systems  Constitutional: No fever/chills. Eyes: No visual changes. ENT: No sore throat. Cardiovascular: Denies chest pain. Respiratory: Denies shortness of breath. Gastrointestinal: No abdominal pain.  No nausea, no vomiting.  No diarrhea.  No constipation. Genitourinary: Negative for dysuria. Musculoskeletal: Positive for back pain. Skin: Negative for rash. Neurological: Negative for headaches, focal weakness or numbness.  10-point ROS otherwise negative.  ____________________________________________   PHYSICAL EXAM:  VITAL SIGNS: ED Triage Vitals  Enc Vitals Group     BP 12/28/16 2338 Marland Kitchen)  140/100     Pulse Rate 12/28/16 2338 89     Resp 12/28/16 2338 18     Temp 12/28/16 2338 97.8 F (36.6 C)     Temp src --      SpO2 12/28/16 2338 100 %     Weight 12/28/16 2337 200 lb (90.7 kg)     Height 12/28/16 2337 5\' 10"  (1.778 m)     Head Circumference --      Peak Flow --      Pain Score 12/28/16 2337 6     Pain Loc --      Pain Edu? --      Excl. in GC? --     Constitutional: Alert and  oriented. Well appearing and in no acute distress. Eyes: Conjunctivae are normal. PERRL. EOMI. Head: Atraumatic. Nose: No congestion/rhinnorhea. Mouth/Throat: Mucous membranes are moist.  Oropharynx non-erythematous. Neck: No stridor.  No cervical spine tenderness to palpation. Cardiovascular: Normal rate, regular rhythm. Grossly normal heart sounds.  Good peripheral circulation. Respiratory: Normal respiratory effort.  No retractions. Lungs CTAB. Gastrointestinal: Soft and nontender. No distention. No abdominal bruits. No CVA tenderness. Musculoskeletal: Midline lumbar spine tenderness without step-offs or deformities. Paraspinal lumbar muscle spasms bilaterally. Negative straight leg raise bilaterally. No lower extremity tenderness nor edema.  No joint effusions. Neurologic:  Normal speech and language. No gross focal neurologic deficits are appreciated.  Skin:  Skin is warm, dry and intact. No rash noted. Psychiatric: Mood and affect are flat. Speech and behavior are normal.  ____________________________________________   LABS (all labs ordered are listed, but only abnormal results are displayed)  Labs Reviewed - No data to display ____________________________________________  EKG  None ____________________________________________  RADIOLOGY  Lumbar spine x-rays interpreted per Dr. Harrie JeansStratton: 1. No acute fracture or dislocation identified.  2. Moderate lumbar spondylosis greatest at the L4-5 and L5-S1  levels.   ____________________________________________   PROCEDURES  Procedure(s) performed: None  Procedures  Critical Care performed: No  ____________________________________________   INITIAL IMPRESSION / ASSESSMENT AND PLAN / ED COURSE  Pertinent labs & imaging results that were available during my care of the patient were reviewed by me and considered in my medical decision making (see chart for details).  51 year old male who presents with low back pain  status post mechanical fall. Will obtain imaging study, administer NSAIDs, muscle relaxer and reassess.  I reviewed the patient's prescription history over the last 12 months in the multi-state controlled substances database(s) that includes [place], [place], [place], [place], [place], [place], [place], [place], [place], [place], [place], [place], and [place].  Results were notable for Clonazepam #60 filled 12/13/2016, Percocet 5/325mg  #12 filled 11/04/2016.  ____________________________________________   FINAL CLINICAL IMPRESSION(S) / ED DIAGNOSES  Final diagnoses:  Fall, initial encounter  Acute midline low back pain without sciatica      NEW MEDICATIONS STARTED DURING THIS VISIT:  Discharge Medication List as of 12/29/2016  5:49 AM    START taking these medications   Details  ibuprofen (ADVIL,MOTRIN) 800 MG tablet Take 1 tablet (800 mg total) by mouth every 8 (eight) hours as needed for moderate pain., Starting Thu 12/29/2016, Print         Note:  This document was prepared using Dragon voice recognition software and may include unintentional dictation errors.    Irean HongJade J Sung, MD 12/29/16 [phone]

## 2018-01-25 DIAGNOSIS — I10 Essential (primary) hypertension: Secondary | ICD-10-CM | POA: Diagnosis not present

## 2018-01-25 DIAGNOSIS — B182 Chronic viral hepatitis C: Secondary | ICD-10-CM | POA: Diagnosis not present

## 2018-01-31 IMAGING — DX DG CHEST 1V
1 series · 1 of 1 positions shown · non-contrast
Comparison: None.

CLINICAL DATA: Altered mental status.

EXAM:
CHEST 1 VIEW

[chest ap]
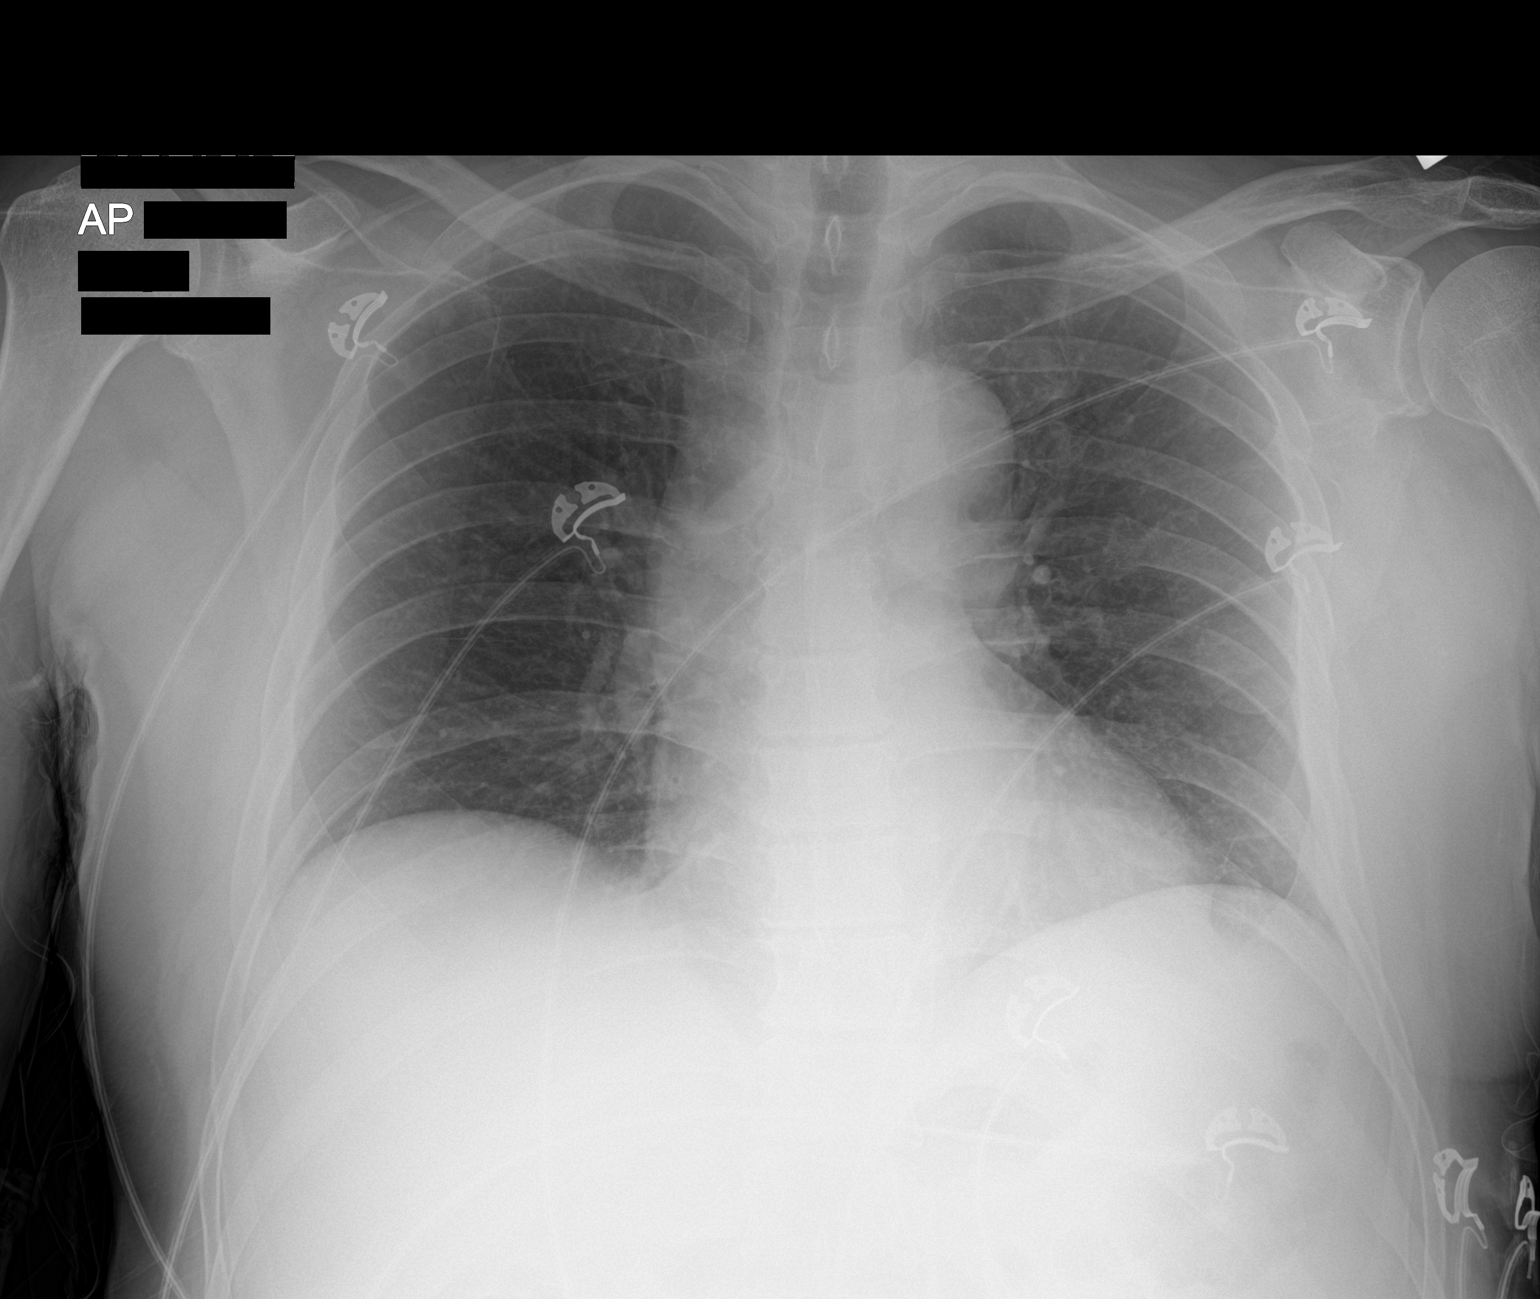

[1 of 1 positions shown; findings below may reference images not displayed]

FINDINGS: The heart size and mediastinal contours are within normal limits.
Both lungs are clear. The visualized skeletal structures are
unremarkable.
IMPRESSION: No active disease.

## 2018-03-23 ENCOUNTER — Other Ambulatory Visit: Payer: Self-pay | Admitting: Internal Medicine

## 2018-03-23 DIAGNOSIS — B182 Chronic viral hepatitis C: Secondary | ICD-10-CM

## 2018-03-28 ENCOUNTER — Ambulatory Visit
Admission: RE | Admit: 2018-03-28 | Discharge: 2018-03-28 | Disposition: A | Discharge status: Home / Self Care | Payer: Medicaid Other | Source: Ambulatory Visit | Attending: Internal Medicine | Admitting: Internal Medicine

## 2018-03-28 DIAGNOSIS — B182 Chronic viral hepatitis C: Secondary | ICD-10-CM

## 2018-04-07 IMAGING — US US ABDOMEN LIMITED
1 series · 14 of 25 positions shown · non-contrast
Comparison: 12/03/2014

CLINICAL DATA: Chronic hepatitis-C.

EXAM:
US ABDOMEN LIMITED - RIGHT UPPER QUADRANT

[Series 1: us abdomen limited · 0.19mm/px · 14 of 49 slices shown]
[im 1/49]
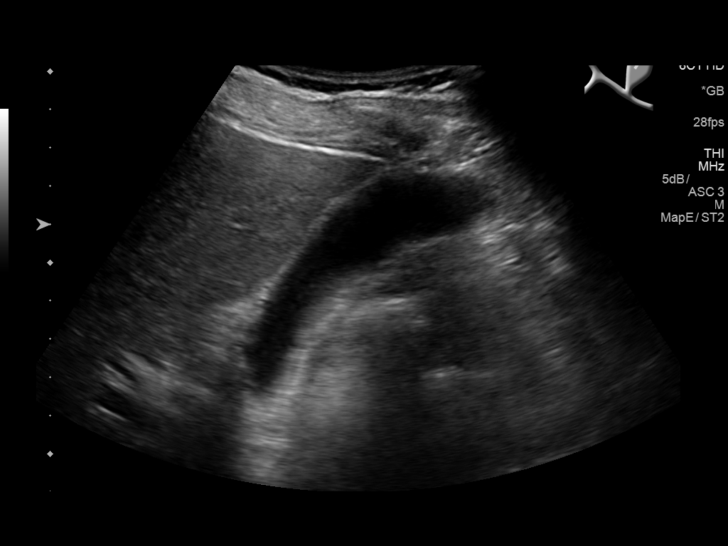
[im 5/49]
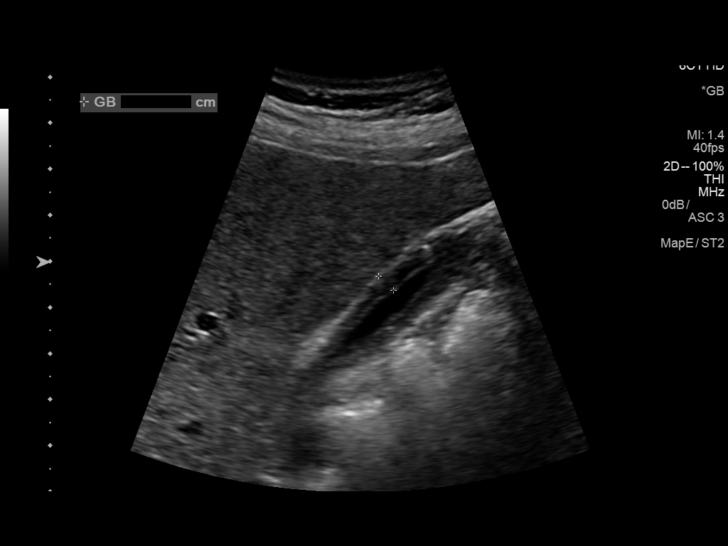
[im 9/49]
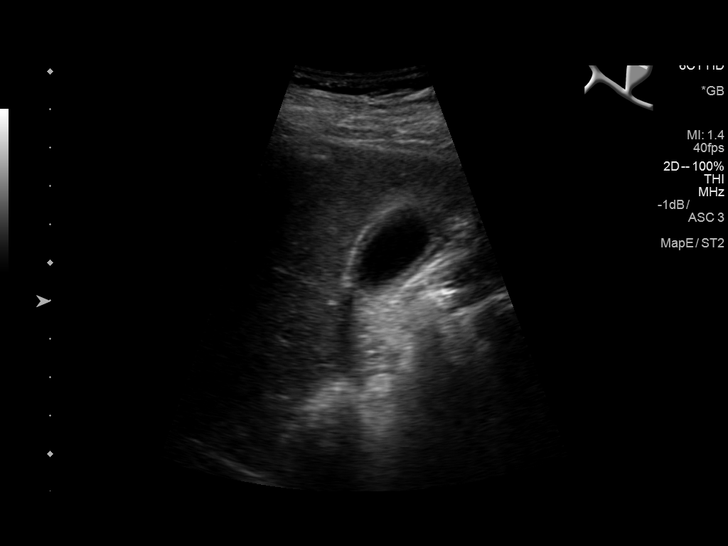
[im 13/49]
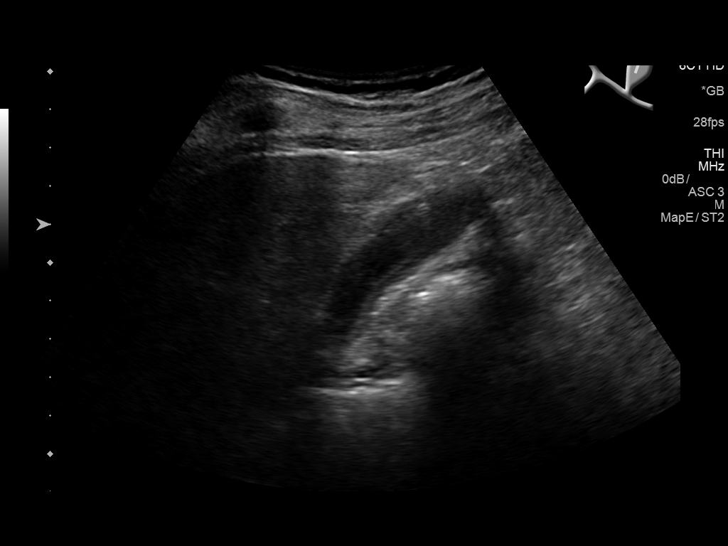
[im 17/49]
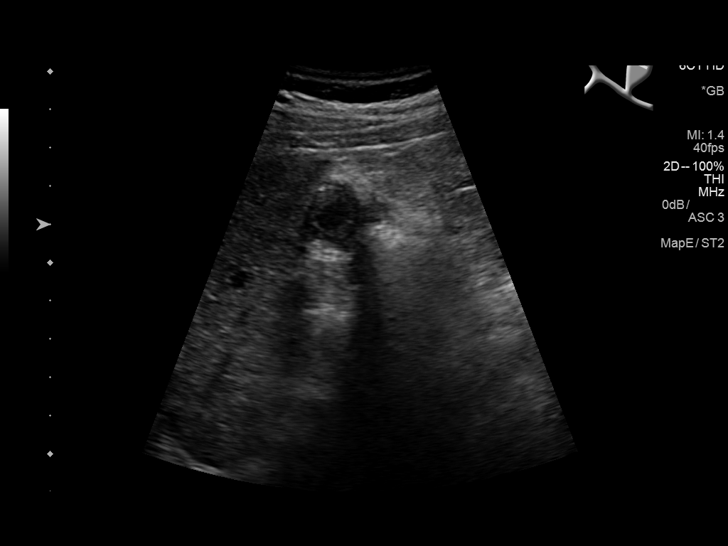
[im 19/49]
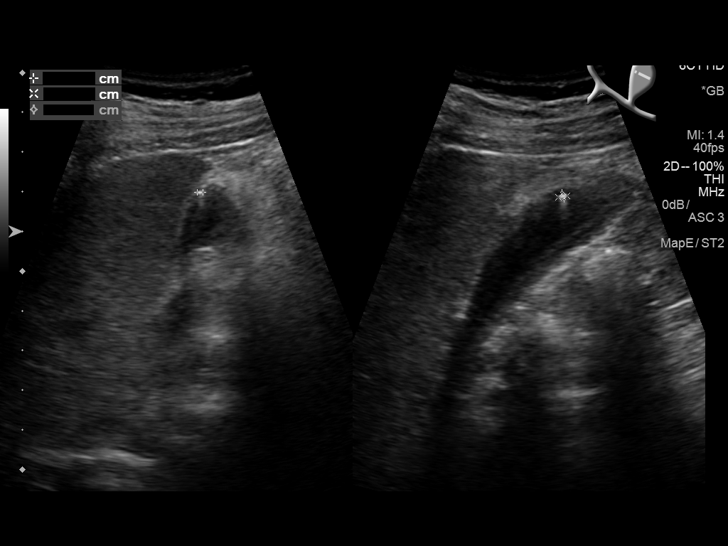
[im 23/49]
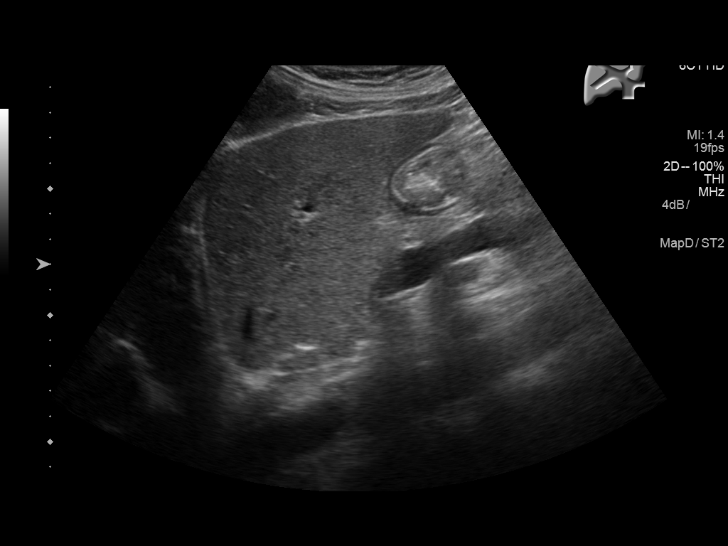
[im 27/49]
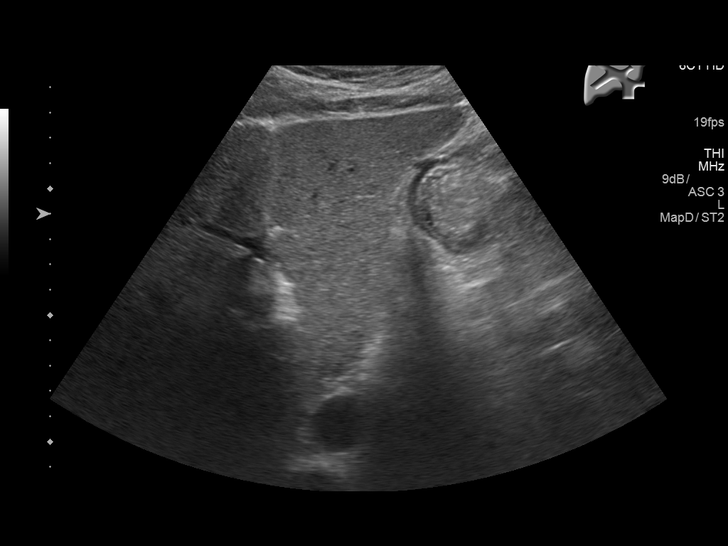
[im 31/49]
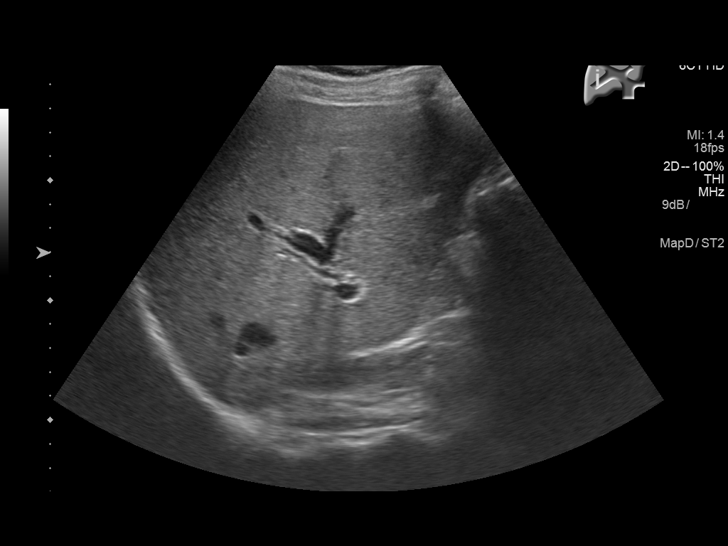
[im 33/49]
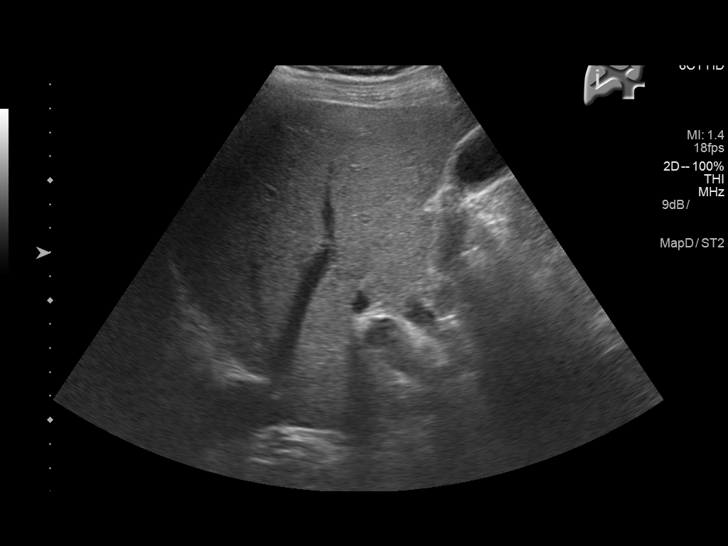
[im 37/49]
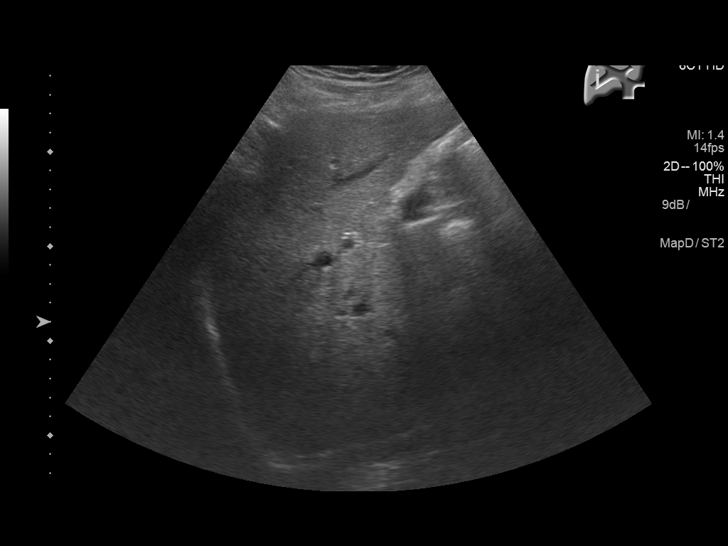
[im 41/49]
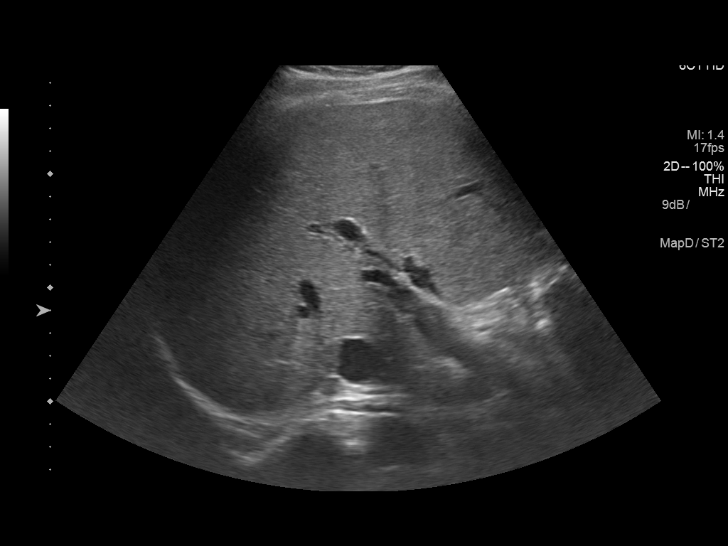
[im 45/49]
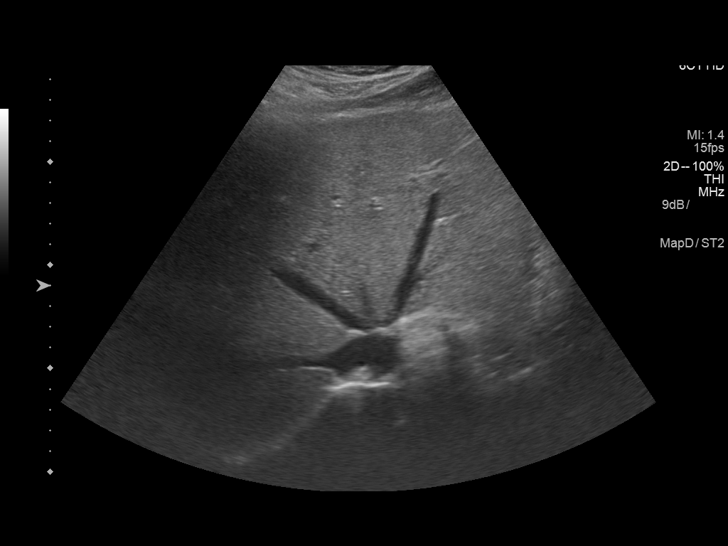
[im 49/49]
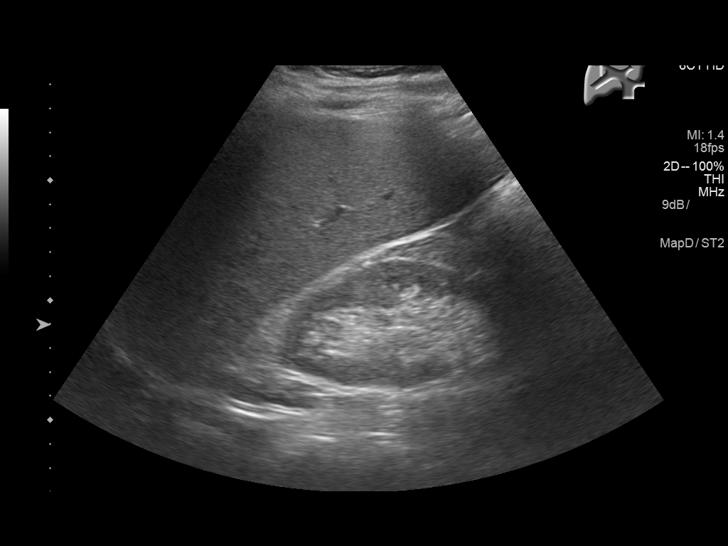

[14 of 25 positions shown; findings below may reference images not displayed]

FINDINGS: Gallbladder:

Gallbladder is contracted and the wall appears thickened, 4-5 mm. No
visible stones. Ring down artifact noted from the anterior
gallbladder wall. Findings suggestive of adenomyomatosis.

Common bile duct:

Diameter: 4 mm in diameter, normal.

Liver:

Increased echotexture. No focal abnormality or biliary duct
dilatation.
IMPRESSION: Gallbladder wall thickening with artifact from the anterior
gallbladder wall compatible with adenomyomatosis. No gallstones.

Increased echotexture throughout the liver compatible with fatty
infiltration.

## 2018-09-11 ENCOUNTER — Other Ambulatory Visit: Payer: Self-pay

## 2018-09-11 ENCOUNTER — Emergency Department: Payer: Medicaid Other

## 2018-09-11 ENCOUNTER — Emergency Department
Admission: EM | Admit: 2018-09-11 | Discharge: 2018-09-11 | Disposition: A | Discharge status: Home / Self Care | Payer: Medicaid Other | Attending: Emergency Medicine | Admitting: Emergency Medicine

## 2018-09-11 DIAGNOSIS — F319 Bipolar disorder, unspecified: Secondary | ICD-10-CM | POA: Diagnosis not present

## 2018-09-11 DIAGNOSIS — W228XXA Striking against or struck by other objects, initial encounter: Secondary | ICD-10-CM | POA: Diagnosis not present

## 2018-09-11 DIAGNOSIS — Z79899 Other long term (current) drug therapy: Secondary | ICD-10-CM | POA: Diagnosis not present

## 2018-09-11 DIAGNOSIS — S0081XA Abrasion of other part of head, initial encounter: Secondary | ICD-10-CM | POA: Diagnosis not present

## 2018-09-11 DIAGNOSIS — Z87891 Personal history of nicotine dependence: Secondary | ICD-10-CM | POA: Insufficient documentation

## 2018-09-11 DIAGNOSIS — F1910 Other psychoactive substance abuse, uncomplicated: Secondary | ICD-10-CM | POA: Insufficient documentation

## 2018-09-11 DIAGNOSIS — Y999 Unspecified external cause status: Secondary | ICD-10-CM | POA: Diagnosis not present

## 2018-09-11 DIAGNOSIS — Y9389 Activity, other specified: Secondary | ICD-10-CM | POA: Diagnosis not present

## 2018-09-11 DIAGNOSIS — Y9289 Other specified places as the place of occurrence of the external cause: Secondary | ICD-10-CM | POA: Insufficient documentation

## 2018-09-11 DIAGNOSIS — S0990XA Unspecified injury of head, initial encounter: Secondary | ICD-10-CM | POA: Diagnosis not present

## 2018-09-11 DIAGNOSIS — Z23 Encounter for immunization: Secondary | ICD-10-CM | POA: Diagnosis not present

## 2018-09-11 DIAGNOSIS — I10 Essential (primary) hypertension: Secondary | ICD-10-CM | POA: Diagnosis not present

## 2018-09-11 DIAGNOSIS — Z0489 Encounter for examination and observation for other specified reasons: Secondary | ICD-10-CM | POA: Diagnosis present

## 2018-09-11 LAB — CBC
HCT: 52.1 % — ABNORMAL HIGH (ref 39.0–52.0)
Hemoglobin: 17.1 g/dL — ABNORMAL HIGH (ref 13.0–17.0)
MCH: 30.1 pg (ref 26.0–34.0)
MCHC: 32.8 g/dL (ref 30.0–36.0)
MCV: 91.6 fL (ref 80.0–100.0)
Platelets: 120 K/uL — ABNORMAL LOW (ref 150–400)
RBC: 5.69 MIL/uL (ref 4.22–5.81)
RDW: 12.5 % (ref 11.5–15.5)
WBC: 7.3 K/uL (ref 4.0–10.5)
nRBC: 0 % (ref 0.0–0.2)

## 2018-09-11 LAB — COMPREHENSIVE METABOLIC PANEL
ALK PHOS: 58 U/L (ref 38–126)
ALT: 50 U/L — AB (ref 0–44)
AST: 39 U/L (ref 15–41)
Albumin: 4.9 g/dL (ref 3.5–5.0)
Anion gap: 14 (ref 5–15)
BILIRUBIN TOTAL: 1.6 mg/dL — AB (ref 0.3–1.2)
BUN: 25 mg/dL — AB (ref 6–20)
CO2: 17 mmol/L — ABNORMAL LOW (ref 22–32)
CREATININE: 1.03 mg/dL (ref 0.61–1.24)
Calcium: 9.5 mg/dL (ref 8.9–10.3)
Chloride: 110 mmol/L (ref 98–111)
GFR calc Af Amer: >60 mL/min (ref >60)
GFR calc non Af Amer: >60 mL/min (ref >60)
GLUCOSE: 72 mg/dL (ref 70–99)
POTASSIUM: 4.2 mmol/L (ref 3.5–5.1)
Sodium: 141 mmol/L (ref 135–145)
TOTAL PROTEIN: 8.1 g/dL (ref 6.5–8.1)

## 2018-09-11 LAB — ETHANOL: Alcohol, Ethyl (B): <10 mg/dL

## 2018-09-11 MED ORDER — TETANUS-DIPHTH-ACELL PERTUSSIS 5-2.5-18.5 LF-MCG/0.5 IM SUSP
0.5000 mL | Freq: Once | INTRAMUSCULAR | Status: AC
  Administered 2018-09-11: 0.5 mL via INTRAMUSCULAR
  Filled 2018-09-11 (×2): qty 0.5

## 2018-09-11 NOTE — ED Notes (Signed)
Stuck X 2 by this RN, X 1 by Vernona RiegerLaura, EDT

## 2018-09-11 NOTE — ED Triage Notes (Addendum)
Pt to ER with [place] [employer] in custody, [employer]er [employer]. Pt here for medical clearance before jail due to intoxication. Pt reports drinking 1 beer 1 Xanax today. Pt appears intoxication, laceration above right eyebrow present, laceration to nose, abrasion to right cheek, unknown when pt sustained injuries.  Alert to place, situation, time, self. Pt drooling on himself, appears disheveled. Pt arousable easily to voice but shuts eyes when not being interacted with. Cooperative.

## 2018-09-11 NOTE — Discharge Instructions (Signed)
Your CT scan of the head and labs today were okay.  We gave you a tetanus shot to protect you from infections.

## 2018-09-11 NOTE — ED Notes (Addendum)
Pt back from CT. Pt does respond to verbal stimuli. Pt denies pain but reports being tired. Pt laying quietly in a hallway bed.

## 2018-09-11 NOTE — ED Notes (Signed)
Pt told it was time for discharge and pt does not want to get up. Ammonia inhallant used. Pt sat up in bed with the assistance of this RN. Verified with MD that CT was clear and pt was ready for discharge. Pt verbalized yes. Pt helped into a wheelchair. Pt unable or unwilling to follow commands and sign d/c instructions.  Verbally went over D/C instructions and handed them to officer.

## 2018-09-11 NOTE — ED Provider Notes (Signed)
[facility] Emergency Department Provider Note  ____________________________________________  Time seen: Approximately 10:10 PM  I have reviewed the triage vital signs and the nursing notes.   HISTORY  Chief Complaint Medical Clearance  Level 5 Caveat: Portions of the History and Physical including HPI and review of systems are unable to be completely obtained due to patient being a poor historian    HPI Ernest Romero is a 52 y.o. male with a history of hypertension anxiety and bipolar disorder, as well as polysubstance abuse who is brought to the ED for medical evaluation by police due to intoxication.  Patient reports drinking 1 beer and taking 1 Xanax today.  He was found stumbling about in the street, trying to open cars.  Patient denies any complaints.      Past Medical History:  Diagnosis Date  . Anxiety   . Bipolar 1 disorder (HCC)   . Hypertension      Patient Active Problem List   Diagnosis Date Noted  . Alcohol abuse 11/03/2016  . Alcohol intoxication (HCC) 11/03/2016  . Stimulant abuse (HCC) 11/03/2016  . Bipolar 1 disorder (HCC) 05/30/2016  . Opiate abuse, episodic (HCC) 05/30/2016  . Benzodiazepine withdrawal (HCC) 05/30/2016  . Opiate withdrawal (HCC) 05/30/2016  . Acute delirium 05/29/2016     History reviewed. No pertinent surgical history.   Prior to Admission medications   Medication Sig Start Date End Date Taking? Authorizing Provider  clonazePAM (KLONOPIN) 1 MG tablet Take 1 mg by mouth 2 (two) times daily. 08/29/18  Yes [provider]  lithium carbonate (ESKALITH) 450 MG CR tablet Take 1 tablet (450 mg total) by mouth at bedtime. 06/01/16  Yes Sainani, Rolly PancakeVivek J, MD  SUBOXONE 8-2 MG FILM Place 2.5 strips under the tongue daily.    Yes [provider]  amLODipine (NORVASC) 5 MG tablet Take 5 mg by mouth daily. 05/09/16   [provider]  ibuprofen (ADVIL,MOTRIN) 800 MG tablet Take 1 tablet (800 mg  total) by mouth every 8 (eight) hours as needed for moderate pain. 12/29/16   Irean HongSung, Jade J, MD  LATUDA 40 MG TABS tablet Take 1 tablet (40 mg total) by mouth daily. 06/01/16   Houston SirenSainani, Vivek J, MD  ondansetron (ZOFRAN ODT) 4 MG disintegrating tablet Take 1 tablet (4 mg total) by mouth every 8 (eight) hours as needed for nausea or vomiting. Patient not taking: Reported on 05/29/2016 02/07/16   Sharman CheekStafford, Sharmon Cheramie, MD  oxyCODONE-acetaminophen (ROXICET) 5-325 MG tablet Take 1 tablet by mouth every 6 (six) hours as needed. 11/04/16   Jeanmarie PlantMcShane, James A, MD  risperiDONE (RISPERDAL) 3 MG tablet Take 3 mg by mouth 2 (two) times daily.     [provider]     Allergies Patient has no known allergies.   No family history on file.  Social History Social History   Tobacco Use  . Smoking status: Former Games developermoker  . Smokeless tobacco: Current User  Substance Use Topics  . Alcohol use: Yes    Alcohol/week: 24.0 standard drinks    Types: 24 Cans of beer per week    Comment: unknown amount or last drink  . Drug use: Yes    Comment: unknown    Review of Systems  Constitutional:   No fever or chills.  ENT:   No epistaxis. Cardiovascular:   No chest pain or syncope. Respiratory:   No dyspnea or cough. Gastrointestinal:   Negative for abdominal pain, vomiting and diarrhea.  Musculoskeletal:  Negative for focal pain or swelling All other systems reviewed and are negative except as documented above in ROS and HPI.  ____________________________________________   PHYSICAL EXAM:  VITAL SIGNS: ED Triage Vitals  Enc Vitals Group     BP 09/11/18 1808 (!) 170/115     Pulse Rate 09/11/18 1808 88     Resp 09/11/18 1808 18     Temp 09/11/18 1808 98 F (36.7 C)     Temp Source 09/11/18 1808 Oral     SpO2 09/11/18 1808 98 %     Weight 09/11/18 1809 198 lb 6.6 oz (90 kg)     Height 09/11/18 1809 5\' 10"  (1.778 m)     Head Circumference --      Peak Flow --      Pain Score 09/11/18 1809 0     Pain  Loc --      Pain Edu? --      Excl. in GC? --     Vital signs reviewed, nursing assessments reviewed.   Constitutional:   Alert and oriented. Non-toxic appearance. Eyes:   Conjunctivae are normal. EOMI. PERRL. ENT      Head:   Normocephalic with scattered abrasions over the forehead and maxillary.  No swelling or focal bony tenderness..      Nose:   No congestion/rhinnorhea.  No epistaxis      Mouth/Throat:   MMM, no pharyngeal erythema. No peritonsillar mass.       Neck:   No meningismus. Full ROM.  No midline tenderness Hematological/Lymphatic/Immunilogical:   No cervical lymphadenopathy. Cardiovascular:   RRR. Symmetric bilateral radial and DP pulses.  No murmurs. Cap refill less than 2 seconds. Respiratory:   Normal respiratory effort without tachypnea/retractions. Breath sounds are clear and equal bilaterally. No wheezes/rales/rhonchi. Gastrointestinal:   Soft and nontender. Non distended. There is no CVA tenderness.  No rebound, rigidity, or guarding.  Musculoskeletal:   Normal range of motion in all extremities. No joint effusions.  No lower extremity tenderness.  No edema. Neurologic:   Normal speech and language.  Motor grossly intact. No acute focal neurologic deficits are appreciated.  Skin:    Skin is warm, dry with abrasions as noted above over the face and some extra scattered abrasions over the hands and forearms without swelling or inflammatory changes. No rash noted.  No petechiae, purpura, or bullae.  ____________________________________________    LABS (pertinent positives/negatives) (all labs ordered are listed, but only abnormal results are displayed) Labs Reviewed  COMPREHENSIVE METABOLIC PANEL - Abnormal; Notable for the following components:      Result Value   CO2 17 (*)    BUN 25 (*)    ALT 50 (*)    Total Bilirubin 1.6 (*)    All other components within normal limits  CBC - Abnormal; Notable for the following components:   Hemoglobin 17.1 (*)     HCT 52.1 (*)    Platelets 120 (*)    All other components within normal limits  ETHANOL  URINE DRUG SCREEN, QUALITATIVE ([facility] ONLY)   ____________________________________________   EKG    ____________________________________________    RADIOLOGY  Ct Head Wo Contrast  Result Date: 09/11/2018 CLINICAL DATA:  Head injury. EXAM: CT HEAD WITHOUT CONTRAST TECHNIQUE: Contiguous axial images were obtained from the base of the skull through the vertex without intravenous contrast. COMPARISON:  CT head 11/03/2016 FINDINGS: Brain: No evidence of acute infarction, hemorrhage, hydrocephalus, extra-axial collection or mass lesion/mass effect. Vascular: Negative for hyperdense vessel Skull:  Negative for skull fracture Sinuses/Orbits: Negative Other: None IMPRESSION: Negative CT head Electronically Signed   By: Marlan Palau M.D.   On: 09/11/2018 20:45    ____________________________________________   PROCEDURES Procedures  ____________________________________________  DIFFERENTIAL DIAGNOSIS   Intracranial hemorrhage, intoxication, malingering  CLINICAL IMPRESSION / ASSESSMENT AND PLAN / ED COURSE  Pertinent labs & imaging results that were available during my care of the patient were reviewed by me and considered in my medical decision making (see chart for details).      Clinical Course as of Sep 11 2209  Tue Sep 11, 2018  2036 Patient represents a decreased level of consciousness, but when I explained my treatment plan he opens his eyes looks at me and says "okay."  I wonder if he is playing of his symptoms for some secondary gain.  I will obtain a CT head to ensure he does not have an intracranial hemorrhage with the scattered minor head trauma that he has on exam.  No evidence of C-spine injury or maxillofacial fracture does not appear to be significantly intoxicated.  Ethanol level is negative, but he may have some residual effect from benzodiazepines.   [PS]    Clinical Course  User Index [PS] Sharman Cheek, MD     ----------------------------------------- 10:22 PM on 09/11/2018 -----------------------------------------  Patient calm.  CT negative.  I believe he is stable for discharge from the emergency department at this time.  ____________________________________________   FINAL CLINICAL IMPRESSION(S) / ED DIAGNOSES    Final diagnoses:  Polysubstance abuse (HCC)  Abrasion of face, initial encounter     ED Discharge Orders    None      Portions of this note were generated with dragon dictation software. Dictation errors may occur despite best attempts at proofreading.    Sharman Cheek, MD 09/11/18 2222

## 2018-09-14 ENCOUNTER — Encounter: Payer: Self-pay | Admitting: Emergency Medicine

## 2018-09-14 ENCOUNTER — Emergency Department
Admission: EM | Admit: 2018-09-14 | Discharge: 2018-09-14 | Disposition: A | Discharge status: Home / Self Care | Payer: Medicaid Other | Attending: Emergency Medicine | Admitting: Emergency Medicine

## 2018-09-14 ENCOUNTER — Other Ambulatory Visit: Payer: Self-pay

## 2018-09-14 DIAGNOSIS — F1123 Opioid dependence with withdrawal: Secondary | ICD-10-CM | POA: Insufficient documentation

## 2018-09-14 DIAGNOSIS — F17228 Nicotine dependence, chewing tobacco, with other nicotine-induced disorders: Secondary | ICD-10-CM | POA: Diagnosis not present

## 2018-09-14 DIAGNOSIS — R5383 Other fatigue: Secondary | ICD-10-CM | POA: Diagnosis not present

## 2018-09-14 DIAGNOSIS — I10 Essential (primary) hypertension: Secondary | ICD-10-CM | POA: Insufficient documentation

## 2018-09-14 DIAGNOSIS — F1193 Opioid use, unspecified with withdrawal: Secondary | ICD-10-CM

## 2018-09-14 DIAGNOSIS — Z79899 Other long term (current) drug therapy: Secondary | ICD-10-CM | POA: Diagnosis not present

## 2018-09-14 DIAGNOSIS — R197 Diarrhea, unspecified: Secondary | ICD-10-CM | POA: Diagnosis present

## 2018-09-14 DIAGNOSIS — R531 Weakness: Secondary | ICD-10-CM | POA: Insufficient documentation

## 2018-09-14 LAB — BASIC METABOLIC PANEL
Anion gap: 10 (ref 5–15)
BUN: 12 mg/dL (ref 6–20)
CO2: 21 mmol/L — ABNORMAL LOW (ref 22–32)
Calcium: 9.4 mg/dL (ref 8.9–10.3)
Chloride: 108 mmol/L (ref 98–111)
Creatinine, Ser: 0.77 mg/dL (ref 0.61–1.24)
GFR calc Af Amer: >60 mL/min (ref >60)
GFR calc non Af Amer: >60 mL/min (ref >60)
Glucose, Bld: 115 mg/dL — ABNORMAL HIGH (ref 70–99)
POTASSIUM: 3.5 mmol/L (ref 3.5–5.1)
Sodium: 139 mmol/L (ref 135–145)

## 2018-09-14 LAB — CBC
HCT: 44.6 % (ref 39.0–52.0)
Hemoglobin: 15.8 g/dL (ref 13.0–17.0)
MCH: 30.3 pg (ref 26.0–34.0)
MCHC: 35.4 g/dL (ref 30.0–36.0)
MCV: 85.6 fL (ref 80.0–100.0)
Platelets: 238 10*3/uL (ref 150–400)
RBC: 5.21 MIL/uL (ref 4.22–5.81)
RDW: 12.2 % (ref 11.5–15.5)
WBC: 8 10*3/uL (ref 4.0–10.5)
nRBC: 0 % (ref 0.0–0.2)

## 2018-09-14 MED ORDER — IBUPROFEN 600 MG PO TABS
600.0000 mg | ORAL_TABLET | ORAL | Status: AC
  Administered 2018-09-14: 600 mg via ORAL

## 2018-09-14 MED ORDER — CLONIDINE HCL 0.1 MG PO TABS: 0.1000 mg | ORAL_TABLET | Freq: Two times a day (BID) | ORAL | 0 refills | Status: DC

## 2018-09-14 MED ORDER — ONDANSETRON HCL 4 MG/2ML IJ SOLN
4.0000 mg | Freq: Once | INTRAMUSCULAR | Status: AC
  Administered 2018-09-14: 4 mg via INTRAVENOUS
  Filled 2018-09-14: qty 2

## 2018-09-14 MED ORDER — IBUPROFEN 600 MG PO TABS
ORAL_TABLET | ORAL | Status: AC
  Administered 2018-09-14: 600 mg via ORAL
  Filled 2018-09-14: qty 1

## 2018-09-14 MED ORDER — LOPERAMIDE HCL 2 MG PO CAPS
4.0000 mg | ORAL_CAPSULE | Freq: Once | ORAL | Status: AC
  Administered 2018-09-14: 4 mg via ORAL
  Filled 2018-09-14: qty 2

## 2018-09-14 MED ORDER — CLONIDINE HCL 0.1 MG PO TABS
0.1000 mg | ORAL_TABLET | Freq: Once | ORAL | Status: AC
  Administered 2018-09-14: 0.1 mg via ORAL
  Filled 2018-09-14: qty 1

## 2018-09-14 MED ORDER — SODIUM CHLORIDE 0.9 % IV BOLUS
1000.0000 mL | Freq: Once | INTRAVENOUS | Status: AC
  Administered 2018-09-14: 1000 mL via INTRAVENOUS

## 2018-09-14 NOTE — ED Notes (Signed)

## 2018-09-14 NOTE — ED Notes (Signed)
Pt states here for suboxone withdrawal.  Last used suboxone 1 week ago, states smoked heroin last Thursday, denies other drug or alcohol use.  Denies SI/HI.  States n/v/d, tremors, diaphoresis, low back pain from injury.  Pt ambulatory with steady gait, A&Ox4, speaking in complete and coherent sentences, chest rise even and unlabored.

## 2018-09-14 NOTE — ED Triage Notes (Signed)
First Nurse Note:  Arrives c/o withdrawal from Suboxone.  Patient states he last took Suboxone one week ago.

## 2018-09-14 NOTE — ED Triage Notes (Addendum)
Pt reports he is here for suboxone withdrawal. Pt c/o body aches, insomnia, diarrhea, runny nose, and head fullness.   Pt states he has not had any suboxone in 1 week. Pt states he needs to be in an environment where he can detox, pt states [facility] until next Thursday.
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
## 2018-09-14 NOTE — BH Assessment (Signed)
Per the request of ER MD (Dr. Fanny BienQuale), writer provided the patient with information for local Suboxone Clinics.  Patient denies SI/HI and AV/H.  _____________ [facility] [address],  [place], [place] [address] [phone]   [facility] [address],  [place], [place] [address] [phone]

## 2018-09-14 NOTE — Discharge Instructions (Addendum)

## 2018-09-16 ENCOUNTER — Emergency Department
Admission: EM | Admit: 2018-09-16 | Discharge: 2018-09-17 | Disposition: A | Discharge status: Home / Self Care | Payer: Medicaid Other | Attending: Emergency Medicine | Admitting: Emergency Medicine

## 2018-09-16 ENCOUNTER — Other Ambulatory Visit: Payer: Self-pay

## 2018-09-16 ENCOUNTER — Encounter: Payer: Self-pay | Admitting: Emergency Medicine

## 2018-09-16 DIAGNOSIS — F1110 Opioid abuse, uncomplicated: Secondary | ICD-10-CM | POA: Diagnosis present

## 2018-09-16 DIAGNOSIS — F1123 Opioid dependence with withdrawal: Secondary | ICD-10-CM | POA: Diagnosis not present

## 2018-09-16 DIAGNOSIS — Z87891 Personal history of nicotine dependence: Secondary | ICD-10-CM | POA: Insufficient documentation

## 2018-09-16 DIAGNOSIS — F1010 Alcohol abuse, uncomplicated: Secondary | ICD-10-CM | POA: Diagnosis not present

## 2018-09-16 DIAGNOSIS — I10 Essential (primary) hypertension: Secondary | ICD-10-CM | POA: Diagnosis not present

## 2018-09-16 DIAGNOSIS — F332 Major depressive disorder, recurrent severe without psychotic features: Secondary | ICD-10-CM | POA: Insufficient documentation

## 2018-09-16 DIAGNOSIS — Z79899 Other long term (current) drug therapy: Secondary | ICD-10-CM | POA: Insufficient documentation

## 2018-09-16 DIAGNOSIS — F1994 Other psychoactive substance use, unspecified with psychoactive substance-induced mood disorder: Secondary | ICD-10-CM | POA: Diagnosis not present

## 2018-09-16 DIAGNOSIS — F329 Major depressive disorder, single episode, unspecified: Secondary | ICD-10-CM | POA: Diagnosis present

## 2018-09-16 DIAGNOSIS — F1193 Opioid use, unspecified with withdrawal: Secondary | ICD-10-CM | POA: Diagnosis present

## 2018-09-16 LAB — COMPREHENSIVE METABOLIC PANEL
ALT: 39 U/L (ref 0–44)
AST: 33 U/L (ref 15–41)
Albumin: 4.2 g/dL (ref 3.5–5.0)
Alkaline Phosphatase: 57 U/L (ref 38–126)
Anion gap: 6 (ref 5–15)
BUN: 11 mg/dL (ref 6–20)
CO2: 28 mmol/L (ref 22–32)
Calcium: 9 mg/dL (ref 8.9–10.3)
Chloride: 107 mmol/L (ref 98–111)
Creatinine, Ser: 1.04 mg/dL (ref 0.61–1.24)
GFR calc Af Amer: >60 mL/min (ref >60)
Glucose, Bld: 129 mg/dL — ABNORMAL HIGH (ref 70–99)
Potassium: 3.2 mmol/L — ABNORMAL LOW (ref 3.5–5.1)
Sodium: 141 mmol/L (ref 135–145)
Total Bilirubin: 0.8 mg/dL (ref 0.3–1.2)
Total Protein: 7 g/dL (ref 6.5–8.1)

## 2018-09-16 LAB — CBC
HCT: 46 % (ref 39.0–52.0)
Hemoglobin: 15.7 g/dL (ref 13.0–17.0)
MCH: 30 pg (ref 26.0–34.0)
MCHC: 34.1 g/dL (ref 30.0–36.0)
MCV: 88 fL (ref 80.0–100.0)
Platelets: 251 10*3/uL (ref 150–400)
RBC: 5.23 MIL/uL (ref 4.22–5.81)
RDW: 12.4 % (ref 11.5–15.5)
WBC: 6.9 10*3/uL (ref 4.0–10.5)
nRBC: 0 % (ref 0.0–0.2)

## 2018-09-16 LAB — ETHANOL: Alcohol, Ethyl (B): <10 mg/dL (ref <10)

## 2018-09-16 LAB — SALICYLATE LEVEL: Salicylate Lvl: <7 mg/dL (ref 2.8–30.0)

## 2018-09-16 LAB — ACETAMINOPHEN LEVEL: Acetaminophen (Tylenol), Serum: <10 ug/mL — ABNORMAL LOW (ref 10–30)

## 2018-09-16 MED ORDER — CLONIDINE HCL 0.1 MG PO TABS
0.1000 mg | ORAL_TABLET | Freq: Two times a day (BID) | ORAL | Status: DC
  Administered 2018-09-16 – 2018-09-17 (×2): 0.1 mg via ORAL
  Filled 2018-09-16 (×2): qty 1

## 2018-09-16 MED ORDER — LOPERAMIDE HCL 2 MG PO CAPS
4.0000 mg | ORAL_CAPSULE | Freq: Two times a day (BID) | ORAL | Status: DC
  Administered 2018-09-16: 4 mg via ORAL
  Filled 2018-09-16: qty 2

## 2018-09-16 MED ORDER — CLONIDINE HCL 0.1 MG PO TABS
0.1000 mg | ORAL_TABLET | Freq: Once | ORAL | Status: AC
  Administered 2018-09-16: 0.1 mg via ORAL
  Filled 2018-09-16: qty 1

## 2018-09-16 NOTE — ED Notes (Signed)
Hourly rounding reveals patient in room. No complaints, stable, in no acute distress. Q15 minute rounds and monitoring via Security Cameras to continue. 

## 2018-09-16 NOTE — ED Triage Notes (Signed)
Pt to ED via POV, pt states that he is being seen for SI for suboxone withdrawal. Pt was seen on 12/13/19for withdrawal symptoms as well. Pt states that he started having SI this morning. Pt denies HI. Pt states that he feels like lashing out. Pt states that he is angry at himself. Pt has been pacing in triage. Pt states that he thought about walking out in front of a car. Pt is cooperative in triage at this time.

## 2018-09-16 NOTE — ED Notes (Signed)
Patient given 0.1mg  of Clonidine, patient is sweaty and says he feel nauseated and having diarhea due to not having any Suboxone. Patient BP is elevated, informed MD, patient is lying down in room.

## 2018-09-16 NOTE — ED Notes (Signed)
Hourly rounding reveals patient sleeping in room. No complaints, stable, in no acute distress. Q15 minute rounds and monitoring via Security Cameras to continue. 

## 2018-09-16 NOTE — BH Assessment (Signed)
Assessment Note  Ernest Romero is an 52 y.o. male who presents to the ER due to voicing SI with the plan of walking into oncoming traffic. He states the cause of his depression and current thoughts, are a result of his symptoms of withdrawals. This is the patient's third ER visit within the last seven days. With the previous ER visits, the patient denied SI but with this visit, he is having SI.  During the interview, the patient was calm, cooperative and pleasant. He was able to provide appropriate answers to the questions. He denies the use of any mind-altering substances. He also denies involvement with the legal system and no history of violence or aggression.   Diagnosis: Depression  Past Medical History:  Past Medical History:  Diagnosis Date  . Anxiety   . Bipolar 1 disorder (HCC)   . Hypertension     History reviewed. No pertinent surgical history.  Family History: No family history on file.  Social History:  reports that he has quit smoking. He uses smokeless tobacco. He reports current alcohol use of about 24.0 standard drinks of alcohol per week. He reports current drug use.  Additional Social History:  Alcohol / Drug Use Pain Medications: See PTA Prescriptions: See PTA Over the Counter: See PTA History of alcohol / drug use?: Yes Longest period of sobriety (when/how long): Unable to quantify Negative Consequences of Use: Personal relationships, Financial  CIWA: CIWA-Ar BP: (!) 160/100 Pulse Rate: 65 COWS:    Allergies: No Known Allergies  Home Medications: (Not in a hospital admission)   OB/GYN Status:  No LMP for male patient.  General Assessment Data Location of Assessment: [facility] ED TTS Assessment: In system Is this a Tele or Face-to-Face Assessment?: Face-to-Face Is this an Initial Assessment or a Re-assessment for this encounter?: Initial Assessment Patient Accompanied by:: N/A Language Other than English: No Living Arrangements: Other (Comment)(Private  Home) What gender do you identify as?: Male Marital status: Single Pregnancy Status: No Living Arrangements: Other (Comment)(Private Home) Can pt return to current living arrangement?: Yes Admission Status: Voluntary Is patient capable of signing voluntary admission?: Yes Referral Source: Self/Family/Friend Insurance type: Medicaid  Medical Screening Exam [facility] Walk-in ONLY) Medical Exam completed: Yes  Crisis Care Plan Living Arrangements: Other (Comment)(Private Home) Legal Guardian: Other:(Self) Name of Psychiatrist: Dr. Janeece Riggers [facility]) Name of Therapist: Dr. Janeece Riggers [facility])  Education Status Is patient currently in school?: No Is the patient employed, unemployed or receiving disability?: Unemployed  Risk to self with the past 6 months Suicidal Ideation: Yes-Currently Present Has patient been a risk to self within the past 6 months prior to admission? : Yes Suicidal Intent: No Has patient had any suicidal intent within the past 6 months prior to admission? : No Is patient at risk for suicide?: Yes Suicidal Plan?: Yes-Currently Present Has patient had any suicidal plan within the past 6 months prior to admission? : Yes Specify Current Suicidal Plan: Walk infront of a car  Access to Means: Yes Specify Access to Suicidal Means: Cars and traffic What has been your use of drugs/alcohol within the last 12 months?: Reports of Suboxone Treatment Previous Attempts/Gestures: No How many times?: 0 Other Self Harm Risks: Reports of none Triggers for Past Attempts: None known Intentional Self Injurious Behavior: None Family Suicide History: Unknown Recent stressful life event(s): Other (Comment)(No longer have his suboxone) Persecutory voices/beliefs?: No Depression: Yes Depression Symptoms: Insomnia, Tearfulness, Fatigue, Isolating, Guilt, Loss of interest in usual pleasures, Feeling worthless/self  pity Substance abuse history and/or treatment for  substance abuse?: Yes Suicide prevention information given to non-admitted patients: Not applicable  Risk to Others within the past 6 months Homicidal Ideation: No Does patient have any lifetime risk of violence toward others beyond the six months prior to admission? : No Thoughts of Harm to Others: No Current Homicidal Intent: No Current Homicidal Plan: No Access to Homicidal Means: No Identified Victim: Reports of none History of harm to others?: No Assessment of Violence: None Noted Violent Behavior Description: Reports of none Does patient have access to weapons?: No Criminal Charges Pending?: No Does patient have a court date: No Is patient on probation?: No  Psychosis Hallucinations: None noted Delusions: None noted  Mental Status Report Appearance/Hygiene: Unremarkable, In scrubs Eye Contact: Fair Motor Activity: Freedom of movement, Unremarkable Speech: Logical/coherent, Unremarkable Level of Consciousness: Alert Mood: Anxious, Depressed, Sad, Pleasant Affect: Appropriate to circumstance, Depressed, Sad Anxiety Level: Moderate Thought Processes: Coherent, Relevant Judgement: Partial Orientation: Person, Place, Time, Situation, Appropriate for developmental age Obsessive Compulsive Thoughts/Behaviors: Minimal  Cognitive Functioning Concentration: Normal Memory: Recent Intact, Remote Intact Is patient IDD: No Insight: Fair Impulse Control: Fair Appetite: Good Have you had any weight changes? : No Change Sleep: Decreased Total Hours of Sleep: 3 Vegetative Symptoms: None  ADLScreening [facility] Assessment Services) Patient's cognitive ability adequate to safely complete daily activities?: Yes Patient able to express need for assistance with ADLs?: Yes Independently performs ADLs?: Yes (appropriate for developmental age)  Prior Inpatient Therapy Prior Inpatient Therapy: No  Prior Outpatient Therapy Prior Outpatient Therapy: Yes Prior Therapy Dates:  Current Prior Therapy [facility]ilty/Provider([facility]  Reason for Treatment: Suboxone Clinic Does patient have an ACCT team?: No Does patient have Intensive In-House Services?  : No Does patient have [facility] services? : No Does patient have P4CC services?: No  ADL Screening (condition at time of admission) Patient's cognitive ability adequate to safely complete daily activities?: Yes Is the patient deaf or have difficulty hearing?: No Does the patient have difficulty seeing, even when wearing glasses/contacts?: No Does the patient have difficulty concentrating, remembering, or making decisions?: No Patient able to express need for assistance with ADLs?: Yes Does the patient have difficulty dressing or bathing?: No Independently performs ADLs?: Yes (appropriate for developmental age) Does the patient have difficulty walking or climbing stairs?: No Weakness of Legs: None Weakness of Arms/Hands: None  Home Assistive Devices/Equipment Home Assistive Devices/Equipment: None  Therapy Consults (therapy consults require a physician order) PT Evaluation Needed: No OT Evalulation Needed: No SLP Evaluation Needed: No Abuse/Neglect Assessment (Assessment to be complete while patient is alone) Abuse/Neglect Assessment Can Be Completed: Yes Verbal Abuse: Denies Sexual Abuse: Denies Exploitation of patient/patient's resources: Denies Self-Neglect: Denies Values / Beliefs Cultural Requests During Hospitalization: None Spiritual Requests During Hospitalization: None Consults Spiritual Care Consult Needed: No Social Work Consult Needed: No Merchant navy officerAdvance Directives (For Healthcare) Does Patient Have a Medical Advance Directive?: No Would patient like information on creating a medical advance directive?: No - Patient declined       Child/Adolescent Assessment Running Away Risk: Denies(Patient is an adult)  Disposition:  Disposition Initial Assessment Completed for this  Encounter: Yes  On Site Evaluation by:   Reviewed with Physician:    Lilyan Gilfordalvin J. Alicia Ackert MS, LCAS, LPC, NCC, CCSI Therapeutic Triage Specialist 09/16/2018 4:38 PM
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
## 2018-09-16 NOTE — ED Notes (Signed)
Report to include Situation, Background, Assessment, and Recommendations received from Jadeka RN. Patient alert and oriented, warm and dry, in no acute distress. Patient denies SI, HI, AVH and pain. Patient made aware of Q15 minute rounds and security cameras for their safety. Patient instructed to come to me with needs or concerns. 
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
## 2018-09-17 DIAGNOSIS — F1994 Other psychoactive substance use, unspecified with psychoactive substance-induced mood disorder: Secondary | ICD-10-CM

## 2018-09-17 DIAGNOSIS — I10 Essential (primary) hypertension: Secondary | ICD-10-CM

## 2018-09-17 MED ORDER — BUPRENORPHINE HCL-NALOXONE HCL 8-2 MG SL SUBL
SUBLINGUAL_TABLET | SUBLINGUAL | Status: AC
  Filled 2018-09-17: qty 1

## 2018-09-17 MED ORDER — AMLODIPINE BESYLATE 5 MG PO TABS: 5.0000 mg | ORAL_TABLET | Freq: Every day | ORAL | 0 refills | Status: DC

## 2018-09-17 MED ORDER — BUPRENORPHINE HCL-NALOXONE HCL 8-2 MG SL SUBL
1.0000 | SUBLINGUAL_TABLET | Freq: Once | SUBLINGUAL | Status: AC
  Administered 2018-09-17: 1 via SUBLINGUAL

## 2018-09-17 MED ORDER — CLONIDINE HCL 0.1 MG PO TABS: 0.1000 mg | ORAL_TABLET | Freq: Two times a day (BID) | ORAL | 0 refills | Status: DC

## 2018-09-17 MED ORDER — BUPRENORPHINE HCL-NALOXONE HCL 8-2 MG SL SUBL
3.0000 | SUBLINGUAL_TABLET | Freq: Once | SUBLINGUAL | Status: AC
  Administered 2018-09-17: 3 via SUBLINGUAL
  Filled 2018-09-17: qty 3

## 2018-09-17 NOTE — ED Notes (Signed)
Hourly rounding reveals patient sleeping in room. No complaints, stable, in no acute distress. Q15 minute rounds and monitoring via Security Cameras to continue. 
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
## 2018-09-17 NOTE — ED Notes (Signed)
Patient tearful asking for "clonidine" related to anxiety and unable to "sit still".

## 2018-09-17 NOTE — ED Provider Notes (Signed)
-----------------------------------------   3:15 PM on 09/17/2018 -----------------------------------------   Blood pressure 131/84, pulse 63, temperature 98 F (36.7 C), temperature source Oral, resp. rate 18, height 5\' 10"  (1.778 m), weight 86.2 kg, SpO2 97 %.  The patient had no acute events since last update.  Calm and cooperative at this time.  Discussed with Dr. Toni Amendlapacs he finds a patient psychiatrically stable for discharge.    Sharman CheekStafford, Yuktha Kerchner, MD 09/17/18 [phone]

## 2019-10-22 DIAGNOSIS — R229 Localized swelling, mass and lump, unspecified: Secondary | ICD-10-CM | POA: Diagnosis not present

## 2019-10-22 DIAGNOSIS — Z8619 Personal history of other infectious and parasitic diseases: Secondary | ICD-10-CM | POA: Diagnosis not present

## 2019-10-22 DIAGNOSIS — I10 Essential (primary) hypertension: Secondary | ICD-10-CM | POA: Diagnosis not present

## 2019-10-22 DIAGNOSIS — Z23 Encounter for immunization: Secondary | ICD-10-CM | POA: Diagnosis not present

## 2020-03-17 DIAGNOSIS — H524 Presbyopia: Secondary | ICD-10-CM | POA: Diagnosis not present

## 2020-03-20 DIAGNOSIS — H5213 Myopia, bilateral: Secondary | ICD-10-CM | POA: Diagnosis not present

## 2020-04-08 DIAGNOSIS — H524 Presbyopia: Secondary | ICD-10-CM | POA: Diagnosis not present

## 2020-04-20 ENCOUNTER — Encounter: Payer: Self-pay | Admitting: Family Medicine

## 2020-04-20 ENCOUNTER — Ambulatory Visit (INDEPENDENT_AMBULATORY_CARE_PROVIDER_SITE_OTHER): Payer: Medicaid Other | Admitting: Family Medicine

## 2020-04-20 ENCOUNTER — Other Ambulatory Visit: Payer: Self-pay

## 2020-04-20 VITALS — BP 109/75 | HR 76 | Temp 97.7°F | Ht 70.0 in | Wt 222.6 lb

## 2020-04-20 DIAGNOSIS — Z7689 Persons encountering health services in other specified circumstances: Secondary | ICD-10-CM | POA: Diagnosis not present

## 2020-04-20 DIAGNOSIS — Z79899 Other long term (current) drug therapy: Secondary | ICD-10-CM

## 2020-04-20 DIAGNOSIS — I10 Essential (primary) hypertension: Secondary | ICD-10-CM

## 2020-04-20 DIAGNOSIS — F319 Bipolar disorder, unspecified: Secondary | ICD-10-CM

## 2020-04-20 DIAGNOSIS — B182 Chronic viral hepatitis C: Secondary | ICD-10-CM | POA: Diagnosis not present

## 2020-04-20 DIAGNOSIS — R229 Localized swelling, mass and lump, unspecified: Secondary | ICD-10-CM | POA: Diagnosis not present

## 2020-04-20 DIAGNOSIS — Z1211 Encounter for screening for malignant neoplasm of colon: Secondary | ICD-10-CM

## 2020-04-20 DIAGNOSIS — B1920 Unspecified viral hepatitis C without hepatic coma: Secondary | ICD-10-CM | POA: Insufficient documentation

## 2020-04-20 MED ORDER — AMLODIPINE BESYLATE 5 MG PO TABS: 5.0000 mg | ORAL_TABLET | Freq: Every day | ORAL | 1 refills | Status: DC

## 2020-04-20 NOTE — Assessment & Plan Note (Signed)
New patient establishment to [facility] 04/20/2020.  Has acute concerns for referral to dermatology for posterior neck nodule removal.  Has acute concerns for referral to GI for history of positive hepatitis C without treatment.  Plan: 1. Labs ordered and to be drawn 2. RTC in 4 weeks for CPE

## 2020-04-20 NOTE — Assessment & Plan Note (Signed)
History of hepatitis C (genotype 1a), labs drawn 09/2019 with [facility] and referral to liver clinic with [facility].  Patient reports has not completed an appointment with liver clinic as of yet, would request new referral.  No easy bruising, fatigue, poor appetite, jaundice, dark urine or abdominal swelling.  Plan: 1. Labs ordered and to be drawn this week 2. Referral to GI for Hepatitis C 3. RTC in 4 weeks

## 2020-04-20 NOTE — Patient Instructions (Signed)
Have your labs completed in the next week.  Our in office lab is open Monday-Friday from 8am-12pm.  Please schedule an appointment on your way out.  A referral to gastroenterology, for colonoscopy and treatment of hepatitis C, has been placed today.  If you have not heard from the specialty office or our referral coordinator within 1 week, please let us know and we will follow up with the referral coordinator for an update.  A referral to Dermatology has been placed today.  If you have not heard from the specialty office or our referral coordinator within 1 week, please let us know and we will follow up with the referral coordinator for an update.  Your medication refills have been sent to your pharmacy on file.  Try to get exercise a minimum of 30 minutes per day at least 5 days per week as well as  adequate water intake all while measuring blood pressure a few times per week.  Keep a blood pressure log and bring back to clinic at your next visit.  If your readings are consistently over 130/80 to contact our office/send me a MyChart message and we will see you sooner.  Can try DASH and Mediterranean diet options, avoiding processed foods, lowering sodium intake, avoiding pork products, and eating a plant based diet for optimal health.  We will plan to see you back in 4 weeks for your physical  You will receive a survey after today's visit either digitally by e-mail or paper by USPS mail. Your experiences and feedback matter to [place].  Please respond so we know how we are doing as we provide care for you.  Call us with any questions/concerns/needs.  It is my goal to be available to you for your health concerns.  Thanks for choosing me to be a partner in your healthcare needs!  Charlaine Dalton, FNP-C Family Nurse Practitioner [facility] Phone: [phone]

## 2020-04-20 NOTE — Progress Notes (Signed)
Subjective:    Patient ID: Ernest Romero, male    DOB: 03/17/1966, 54 y.o.   MRN: [id number]  Ernest Romero is a 54 y.o. male presenting on 04/20/2020 for Establish Care   HPI  Previous PCP was at [facility] in [place].  Records will be requested.  Past medical, family, and surgical history reviewed w/ pt.  Ernest Romero presents to clinic with acute concerns for referral to dermatology for skin nodule on posterior neck and for referral to gastroenterology for treatment of his hepatitis C.  Reports that Ernest Romero has had this skin nodule on the back of his neck for many years and is now interested in having it removed.  Denies changes in size or any discomfort.  Has a history of hepatitis C, was referred by previous PCP to the liver clinic with [facility], but has not completed an appointment with them.  Denies any easy bruising, fatigue, weight loss/gain, poor appetite, jaundice, dark urine or abdominal swelling.  No flowsheet data found.  Social History   Tobacco Use  . Smoking status: Former Games developer  . Smokeless tobacco: Never Used  Vaping Use  . Vaping Use: Every day  . Substances: Nicotine, Flavoring  Substance Use Topics  . Alcohol use: Not Currently    Comment: unknown amount or last drink  . Drug use: Not Currently    Comment: unknown    Review of Systems  Constitutional: Negative.   HENT: Negative.   Eyes: Negative.   Respiratory: Negative.   Cardiovascular: Negative.   Gastrointestinal: Negative.   Endocrine: Negative.   Genitourinary: Negative.   Musculoskeletal: Negative.   Skin: Negative.        Nodule on back of neck  Allergic/Immunologic: Negative.   Neurological: Negative.   Hematological: Negative.   Psychiatric/Behavioral: Negative.    Per HPI unless specifically indicated above     Objective:    BP 109/75 (BP Location: Left Arm, Patient Position: Sitting, Cuff Size: Normal)   Pulse 76   Temp 97.7 F (36.5 C) (Temporal)   Ht 5\' 10"  (1.778 m)    Wt 222 lb 9.6 oz (101 kg)   BMI 31.94 kg/m   Wt Readings from Last 3 Encounters:  04/20/20 222 lb 9.6 oz (101 kg)  09/16/18 190 lb (86.2 kg)  09/14/18 190 lb (86.2 kg)    Physical Exam Vitals reviewed.  Constitutional:      General: Ernest Romero is not in acute distress.    Appearance: Normal appearance. Ernest Romero is obese. Ernest Romero is not ill-appearing or toxic-appearing.  HENT:     Head: Normocephalic and atraumatic.     Nose:     Comments: 09/16/18 is in place, covering mouth and nose. Eyes:     General:        Right eye: No discharge.        Left eye: No discharge.     Extraocular Movements: Extraocular movements intact.     Conjunctiva/sclera: Conjunctivae normal.     Pupils: Pupils are equal, round, and reactive to light.  Neck:     Thyroid: No thyroid mass, thyromegaly or thyroid tenderness.  Cardiovascular:     Rate and Rhythm: Normal rate and regular rhythm.     Pulses: Normal pulses.          Dorsalis pedis pulses are 2+ on the right side and 2+ on the left side.     Heart sounds: Normal heart sounds. No murmur heard.  No friction rub. No gallop.  Pulmonary:     Effort: Pulmonary effort is normal. No respiratory distress.     Breath sounds: Normal breath sounds.  Abdominal:     General: Bowel sounds are normal.     Palpations: Abdomen is soft.     Tenderness: There is no abdominal tenderness.     Comments: Unable to palpate for hepatomegaly or splenomegaly due to body habitus  Musculoskeletal:     Cervical back: Full passive range of motion without pain.     Right lower leg: No edema.     Left lower leg: No edema.  Lymphadenopathy:     Cervical: No cervical adenopathy.  Skin:    General: Skin is warm and dry.     Capillary Refill: Capillary refill takes less than 2 seconds.       Neurological:     General: No focal deficit present.     Mental Status: Ernest Romero is alert and oriented to person, place, and time.  Psychiatric:        Attention and Perception: Attention and perception  normal.        Mood and Affect: Mood and affect normal.        Speech: Speech normal.        Behavior: Behavior normal. Behavior is cooperative.        Thought Content: Thought content normal.        Cognition and Memory: Cognition and memory normal.    Results for orders placed or performed during the hospital encounter of 09/16/18  Comprehensive metabolic panel  Result Value Ref Range   Sodium 141 135 - 145 mmol/L   Potassium 3.2 (L) 3.5 - 5.1 mmol/L   Chloride 107 98 - 111 mmol/L   CO2 28 22 - 32 mmol/L   Glucose, Bld 129 (H) 70 - 99 mg/dL   BUN 11 6 - 20 mg/dL   Creatinine, Ser 1.61 0.61 - 1.24 mg/dL   Calcium 9.0 8.9 - 09.6 mg/dL   Total Protein 7.0 6.5 - 8.1 g/dL   Albumin 4.2 3.5 - 5.0 g/dL   AST 33 15 - 41 U/L   ALT 39 0 - 44 U/L   Alkaline Phosphatase 57 38 - 126 U/L   Total Bilirubin 0.8 0.3 - 1.2 mg/dL   GFR calc non Af Amer >60 >60 mL/min   GFR calc Af Amer >60 >60 mL/min   Anion gap 6 5 - 15  Ethanol  Result Value Ref Range   Alcohol, Ethyl (B) <10 <10 mg/dL  Salicylate level  Result Value Ref Range   Salicylate Lvl <7.0 2.8 - 30.0 mg/dL  Acetaminophen level  Result Value Ref Range   Acetaminophen (Tylenol), Serum <10 (L) 10 - 30 ug/mL  cbc  Result Value Ref Range   WBC 6.9 4.0 - 10.5 K/uL   RBC 5.23 4.22 - 5.81 MIL/uL   Hemoglobin 15.7 13.0 - 17.0 g/dL   HCT 04.5 39 - 52 %   MCV 88.0 80.0 - 100.0 fL   MCH 30.0 26.0 - 34.0 pg   MCHC 34.1 30.0 - 36.0 g/dL   RDW 40.9 81.1 - 91.4 %   Platelets 251 150 - 400 K/uL   nRBC 0.0 0.0 - 0.2 %      Assessment & Plan:   Problem List Items Addressed This Visit      Cardiovascular and Mediastinum   Hypertension - Primary    Controlled hypertension.  BP is at goal < 130/80.  Pt is not working  on lifestyle modifications.  Taking medications tolerating well without side effects.   Plan: 1. Continue taking amlodipine 5mg  daily 2. Obtain labs in the next 1-2 weeks  3. Encouraged heart healthy diet and increasing  exercise to 30 minutes most days of the week, going no more than 2 days in a row without exercise. 4. Check BP 1-2 x per week at home, keep log, and bring to clinic at next appointment. 5. Follow up 4 weeks.         Relevant Medications   amLODipine (NORVASC) 5 MG tablet   Other Relevant Orders   CBC with Differential   COMPLETE METABOLIC PANEL WITH GFR   Lipid Profile   Thyroid Panel With TSH     Digestive   Hepatitis C    History of hepatitis C (genotype 1a), labs drawn 09/2019 with [facility] and referral to liver clinic with [facility].  Patient reports has not completed an appointment with liver clinic as of yet, would request new referral.  No easy bruising, fatigue, poor appetite, jaundice, dark urine or abdominal swelling.  Plan: 1. Labs ordered and to be drawn this week 2. Referral to GI for Hepatitis C 3. RTC in 4 weeks      Relevant Orders   Hepatitis C RNA quantitative   Hepatitis C genotype   Amb Referral to Hepatology     Other   Bipolar 1 disorder (HCC)   Encounter to establish care with new doctor    New patient establishment to [facility] 04/20/2020.  Has acute concerns for referral to dermatology for posterior neck nodule removal.  Has acute concerns for referral to GI for history of positive hepatitis C without treatment.  Plan: 1. Labs ordered and to be drawn 2. RTC in 4 weeks for CPE      Skin nodule   Relevant Orders   Ambulatory referral to Dermatology    Other Visit Diagnoses    Long-term use of high-risk medication       Relevant Orders   Thyroid Panel With TSH   Colon cancer screening       Relevant Orders   Ambulatory referral to Gastroenterology      Meds ordered this encounter  Medications  . amLODipine (NORVASC) 5 MG tablet    Sig: Take 1 tablet (5 mg total) by mouth daily.    Dispense:  90 tablet    Refill:  1      Follow up plan: Return in about 4 weeks (around 05/18/2020) for CPE.   05/20/2020, FNP Family Nurse  Practitioner [facility] 04/20/2020, 2:55 PM

## 2020-04-20 NOTE — Assessment & Plan Note (Signed)
Controlled hypertension.  BP is at goal < 130/80.  Pt is not working on lifestyle modifications.  Taking medications tolerating well without side effects.   Plan: 1. Continue taking amlodipine 5mg  daily 2. Obtain labs in the next 1-2 weeks  3. Encouraged heart healthy diet and increasing exercise to 30 minutes most days of the week, going no more than 2 days in a row without exercise. 4. Check BP 1-2 x per week at home, keep log, and bring to clinic at next appointment. 5. Follow up 4 weeks.

## 2020-05-18 ENCOUNTER — Other Ambulatory Visit: Payer: Self-pay

## 2020-05-18 ENCOUNTER — Encounter: Payer: Medicaid Other | Admitting: Family Medicine

## 2020-05-18 ENCOUNTER — Encounter: Payer: Self-pay | Admitting: Family Medicine

## 2020-05-18 ENCOUNTER — Ambulatory Visit (INDEPENDENT_AMBULATORY_CARE_PROVIDER_SITE_OTHER): Payer: Medicaid Other | Admitting: Family Medicine

## 2020-05-18 VITALS — BP 131/88 | HR 64 | Temp 97.1°F | Resp 18 | Ht 70.0 in | Wt 216.8 lb

## 2020-05-18 DIAGNOSIS — Z5329 Procedure and treatment not carried out because of patient's decision for other reasons: Secondary | ICD-10-CM

## 2020-05-18 DIAGNOSIS — Z91199 Patient's noncompliance with other medical treatment and regimen due to unspecified reason: Secondary | ICD-10-CM

## 2020-05-18 NOTE — Progress Notes (Signed)
No show for virtual visit  

## 2020-05-22 ENCOUNTER — Encounter: Payer: Medicaid Other | Admitting: Family Medicine

## 2020-07-01 ENCOUNTER — Other Ambulatory Visit: Payer: Self-pay

## 2020-07-01 ENCOUNTER — Encounter: Payer: Self-pay | Admitting: Dermatology

## 2020-07-01 ENCOUNTER — Ambulatory Visit (INDEPENDENT_AMBULATORY_CARE_PROVIDER_SITE_OTHER): Payer: Medicaid Other | Admitting: Dermatology

## 2020-07-01 DIAGNOSIS — L720 Epidermal cyst: Secondary | ICD-10-CM

## 2020-07-01 NOTE — Patient Instructions (Addendum)
Pre-Operative Instructions  You are scheduled for a surgical procedure at [facility]. We recommend you read the following instructions. If you have any questions or concerns, please call the office at [phone].  1. Shower and wash the entire body with soap and water the day of your surgery paying special attention to cleansing at and around the planned surgery site.  2. Avoid aspirin or aspirin containing products at least fourteen (14) days prior to your surgical procedure and for at least one week (7 Days) after your surgical procedure. If you take aspirin on a regular basis for heart disease or history of stroke or for any other reason, we may recommend you continue taking aspirin but please notify us if you take this on a regular basis. Aspirin can cause more bleeding to occur during surgery as well as prolonged bleeding and bruising after surgery.   3. Avoid other nonsteroidal pain medications at least one week prior to surgery and at least one week prior to your surgery. These include medications such as Ibuprofen (Motrin, Advil and Nuprin), Naprosyn, Voltaren, Relafen, etc. If medications are used for therapeutic reasons, please inform us as they can cause increased bleeding or prolonged bleeding during and bruising after surgical procedures.   4. Please advice us if you are taking any "blood thinner" medications such as Coumadin or Dipyridamole or Plavix or similar medications. These cause increased bleeding and prolonged bleeding during and bruising after surgical procedures. We may have to consider discontinuing these medications briefly prior to and shortly after your surgery, if safe to do so.   5. Please inform us of all medications you are currently taking. All medications that are taken regularly should be taken the day of surgery as you always do. Nevertheless, we need to be informed of what medications you are taking prior to surgery to whether they will affect the  procedure or cause any complications.   6. Please inform us of any medication allergies. Also inform us of whether you have allergies to Latex or rubber products or whether you have had any adverse reaction to Lidocaine or Epinephrine.  7. Please inform us of any prosthetic or artificial body parts such as artificial heart valve, joint replacements, etc., or similar condition that might require preoperative antibiotics.   8. We recommend avoidance of alcohol at least two weeks prior to surgery and continued avoidence for at least two weeks after surgery.   9. We recommend discontinuation of tobacco smoking at least two weeks prior to surgery and continued abstinence for at least two weeks after surgery.  10. Do not plan strenuous exercise, strenuous work or strenuous lifting for approximately four weeks after your surgery.   11. We request if you are unable to make your scheduled surgical appointment, please call us at least a week in advance or as soon as you are aware of a problem sot aht we can cancel or reschedule you.   12. You MAKE TAKE TYLENOL (acetaminophen) for pain as it is not a blood thinner.   13. PLEASE PLAN TO BE IN TOWN FOR TWO WEEKS FOLLOWING SURGERY, THIS IS IMPORTANT SO YOU CAN BE CHECKED FOR DRESSING CHANGES, SUTURE REMOVAL AND TO MONITOR FOR POSSIBLE COMPLICATIONS.   

## 2020-07-01 NOTE — Progress Notes (Signed)
   New Patient Visit  Subjective  Ernest Romero is a 54 y.o. male who presents for the following: growth (posterior neck, ~8yr, no pain, no hx of drainage, New patient, no hx of skin ca).  New patient referral from Danielle Rankin, FNP at [facility].  The following portions of the chart were reviewed this encounter and updated as appropriate:  Tobacco  Allergies  Meds  Problems  Med Hx  Surg Hx  Fam Hx     Review of Systems:  No other skin or systemic complaints except as noted in HPI or Assessment and Plan.  Objective  Well appearing patient in no apparent distress; mood and affect are within normal limits.  A focused examination was performed including posterior neck. Relevant physical exam findings are noted in the Assessment and Plan.  Objective  Left posterior neck: 1.5cm cystic pap   Assessment & Plan  Epidermal cyst Left posterior neck  Symptomatic and growing  Discussed excising, will schedule patient for surgery.  Return for to be scheduled for surgery for cyst L posterior neck.   I, Ardis Rowan, RMA, am acting as scribe for Armida Sans, MD .  Documentation: I have reviewed the above documentation for accuracy and completeness, and I agree with the above.  Armida Sans, MD

## 2020-07-02 ENCOUNTER — Ambulatory Visit (INDEPENDENT_AMBULATORY_CARE_PROVIDER_SITE_OTHER): Payer: Medicaid Other | Admitting: Gastroenterology

## 2020-07-02 ENCOUNTER — Encounter: Payer: Self-pay | Admitting: Gastroenterology

## 2020-07-02 VITALS — BP 114/78 | HR 75 | Ht 70.0 in | Wt 217.8 lb

## 2020-07-02 DIAGNOSIS — B182 Chronic viral hepatitis C: Secondary | ICD-10-CM | POA: Diagnosis not present

## 2020-07-02 NOTE — Progress Notes (Signed)
Gastroenterology Consultation  Referring Provider:     Tarri Fuller, FNP Primary Care Physician:  Tarri Fuller, FNP Primary Gastroenterologist:  Dr. Servando Snare     Reason for Consultation:     Hepatitis C        HPI:   Ernest Romero is a 54 y.o. y/o male referred for consultation & management of hepatitis C by Dr. Anitra Lauth, Jodelle Gross, FNP.  This patient comes in today after being seen by Dr. Norma Fredrickson in the past for his hepatitis C.   The patient was prescribed Mavyret for his hepatitis C. The patient had not followed up with a hepatologist and missed his last primary care provider's appointment.  The patient had blood work done in December at [facility] and at that time the patient was found to have a viral level of 1,200,000 international units/mL and a genotype of 1A. The patient reports that at the time of his last GI visit and recommended to start treatment he was actively drinking and the medication was never started on him.  He denies any fevers chills nausea vomiting black stools or bloody stools.  The patient had a fibrosis scan back in 2016 with F0-F1 found.  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  Past Medical History:  Diagnosis Date  . Anxiety   . Bipolar 1 disorder (HCC)   . Depression   . Hepatitis   . Hypertension     No past surgical history on file.  Prior to Admission medications   Medication Sig Start Date End Date Taking? Authorizing Provider  amLODipine (NORVASC) 5 MG tablet Take 1 tablet (5 mg total) by mouth daily. 04/20/20   Malfi, Jodelle Gross, FNP  Cariprazine HCl (VRAYLAR) 4.5 MG CAPS Take 6 mg by mouth daily.  06/06/19   [provider]  clonazePAM (KLONOPIN) 1 MG tablet Take 1 mg by mouth 2 (two) times daily. 08/29/18   [provider]  LATUDA 40 MG TABS tablet Take 1 tablet (40 mg total) by mouth daily. Patient not taking: Reported on 05/18/2020 06/01/16   Houston Siren, MD  lithium carbonate (ESKALITH) 450 MG CR tablet Take 1 tablet (450 mg total) by mouth at bedtime. 06/01/16   Houston Siren, MD  oxyCODONE-acetaminophen (ROXICET) 5-325 MG tablet Take 1 tablet by mouth every 6 (six) hours as needed. Patient not taking: Reported on 05/18/2020 11/04/16   Jeanmarie Plant, MD  risperiDONE (RISPERDAL) 3 MG tablet Take 3 mg by mouth 2 (two) times daily.  Patient not taking: Reported on 04/20/2020    [provider]  SUBOXONE 8-2 MG FILM Place 2.5 strips under the tongue daily.     [provider]    No family history on file.   Social History   Tobacco Use  . Smoking status: Former Games developer  . Smokeless tobacco: Never Used  Vaping Use  . Vaping Use: Every day  . Substances: Nicotine, Flavoring  Substance Use Topics  . Alcohol use: Not Currently    Comment: unknown amount or last drink  . Drug use: Not Currently    Comment: unknown    Allergies as of 07/02/2020  . (No Known Allergies)    Review of Systems:    All systems reviewed and negative except where noted in HPI.   Physical Exam:  There were no vitals taken for this visit. No  LMP for male patient. General:   Alert,  Well-developed, well-nourished, pleasant and cooperative in NAD Head:  Normocephalic and atraumatic. Eyes:  Sclera clear, no icterus.   Conjunctiva pink. Ears:  Normal auditory acuity. Neck:  Supple; no masses or thyromegaly. Lungs:  Respirations even and unlabored.  Clear throughout to auscultation.   No wheezes, crackles, or rhonchi. No acute distress. Heart:  Regular rate and rhythm; no murmurs, clicks, rubs, or gallops. Abdomen:  Normal bowel sounds.  No bruits.  Soft, non-tender and non-distended without masses, hepatosplenomegaly or hernias noted.  No guarding or rebound tenderness.  Negative Carnett sign.   Rectal:  Deferred.  Pulses:  Normal pulses noted. Extremities:  No clubbing or edema.  No cyanosis. Neurologic:  Alert and oriented x3;   grossly normal neurologically. Skin:  Intact without significant lesions or rashes.  No jaundice. Lymph Nodes:  No significant cervical adenopathy. Psych:  Alert and cooperative. Normal mood and affect.  Imaging Studies: No results found.  Assessment and Plan:   Ernest Romero is a 54 y.o. 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  The patient has never been treated for his hepatitis C.  The patient will have some baseline lab sent off and pending the results of those will be started on his treatment for hepatitis C.  The patient states that his biggest side effect from the hepatitis C is he believes it is causing his fatigue.  The patient has been explained the plan agrees with it.    Midge Minium, MD. Clementeen Graham    Note: This dictation was prepared with Dragon dictation along with smaller phrase technology. Any transcriptional errors that result from this process are unintentional.

## 2020-07-04 LAB — HCV RNA QUANT
HCV log10: 6.408 log10 IU/mL
Hepatitis C Quantitation: 2560000 IU/mL

## 2020-07-04 LAB — HCV FIBROSURE
ALPHA 2-MACROGLOBULINS, QN: 402 mg/dL — ABNORMAL HIGH (ref 110–276)
ALT (SGPT) P5P: 46 IU/L (ref 0–55)
Apolipoprotein A-1: 116 mg/dL (ref 101–178)
Bilirubin, Total: 0.2 mg/dL (ref 0.0–1.2)
Fibrosis Score: 0.63 — ABNORMAL HIGH (ref 0.00–0.21)
GGT: 34 IU/L (ref 0–65)
Haptoglobin: 50 mg/dL (ref 29–370)
Necroinflammat Activity Score: 0.37 — ABNORMAL HIGH (ref 0.00–0.17)

## 2020-07-04 LAB — HEPATIC FUNCTION PANEL
ALT: 41 IU/L (ref 0–44)
AST: 30 IU/L (ref 0–40)
Albumin: 4.4 g/dL (ref 3.8–4.9)
Alkaline Phosphatase: 83 IU/L (ref 44–121)
Bilirubin Total: 0.4 mg/dL (ref 0.0–1.2)
Bilirubin, Direct: 0.11 mg/dL (ref 0.00–0.40)
Total Protein: 7.3 g/dL (ref 6.0–8.5)

## 2020-07-06 ENCOUNTER — Telehealth: Payer: Self-pay

## 2020-07-06 NOTE — Telephone Encounter (Signed)
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 

## 2020-07-07 ENCOUNTER — Telehealth: Payer: Self-pay

## 2020-07-07 NOTE — Telephone Encounter (Signed)
Pt notified of lab results

## 2020-07-07 NOTE — Telephone Encounter (Signed)
Pt notified of lab results. Paperwork completed for Hep C treatment and faxed to [employer] [facility].

## 2020-07-07 NOTE — Telephone Encounter (Signed)
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

## 2020-07-16 ENCOUNTER — Other Ambulatory Visit: Payer: Self-pay

## 2020-07-16 MED ORDER — SOFOSBUVIR-VELPATASVIR 400-100 MG PO TABS: 1.0000 | ORAL_TABLET | Freq: Every day | ORAL | 2 refills | Status: DC

## 2020-07-21 ENCOUNTER — Other Ambulatory Visit: Payer: Self-pay

## 2020-07-21 ENCOUNTER — Encounter: Payer: Self-pay | Admitting: Gastroenterology

## 2020-07-21 ENCOUNTER — Ambulatory Visit (INDEPENDENT_AMBULATORY_CARE_PROVIDER_SITE_OTHER): Payer: Medicaid Other | Admitting: Gastroenterology

## 2020-07-21 VITALS — BP 122/84 | HR 85 | Temp 97.8°F | Ht 70.0 in | Wt 219.2 lb

## 2020-07-21 DIAGNOSIS — B182 Chronic viral hepatitis C: Secondary | ICD-10-CM | POA: Diagnosis not present

## 2020-07-21 NOTE — Progress Notes (Signed)
    Primary Care Physician: Tarri Fuller, FNP  Primary Gastroenterologist:  Dr. Midge Minium  Chief Complaint  Patient presents with  . Follow-up    Hep C    HPI: Ernest Romero is a 54 y.o. male here for follow up of his Hepatitis C. The patient had not heard from the pharmacy and was worried so he came back to see me. He ha no further issues at this time.  Past Medical History:  Diagnosis Date  . Anxiety   . Bipolar 1 disorder (HCC)   . Depression   . Hepatitis   . Hypertension     Current Outpatient Medications  Medication Sig Dispense Refill  . amLODipine (NORVASC) 5 MG tablet Take 1 tablet (5 mg total) by mouth daily. 90 tablet 1  . Cariprazine HCl (VRAYLAR) 4.5 MG CAPS Take 6 mg by mouth daily.     . clonazePAM (KLONOPIN) 1 MG tablet Take 1 mg by mouth 2 (two) times daily.  2  . lithium carbonate (ESKALITH) 450 MG CR tablet Take 1 tablet (450 mg total) by mouth at bedtime. 30 tablet 0  . SUBOXONE 8-2 MG FILM Place 2.5 strips under the tongue daily.   1  . LATUDA 40 MG TABS tablet Take 1 tablet (40 mg total) by mouth daily. (Patient not taking: Reported on 07/21/2020) 30 tablet 0  . oxyCODONE-acetaminophen (ROXICET) 5-325 MG tablet Take 1 tablet by mouth every 6 (six) hours as needed. (Patient not taking: Reported on 07/21/2020) 12 tablet 0  . risperiDONE (RISPERDAL) 3 MG tablet Take 3 mg by mouth 2 (two) times daily.  (Patient not taking: Reported on 07/21/2020)    . Sofosbuvir-Velpatasvir (EPCLUSA) 400-100 MG TABS Take 1 tablet by mouth daily. (Patient not taking: Reported on 07/21/2020) 28 tablet 2   No current facility-administered medications for this visit.    Allergies as of 07/21/2020  . (No Known Allergies)    ROS:  General: Negative for anorexia, weight loss, fever, chills, fatigue, weakness. ENT: Negative for hoarseness, difficulty swallowing , nasal congestion. CV: Negative for chest pain, angina, palpitations, dyspnea on exertion, peripheral edema.    Respiratory: Negative for dyspnea at rest, dyspnea on exertion, cough, sputum, wheezing.  GI: See history of present illness. GU:  Negative for dysuria, hematuria, urinary incontinence, urinary frequency, nocturnal urination.  Endo: Negative for unusual weight change.    Physical Examination:   BP 122/84 (BP Location: Right Arm)   Pulse 85   Temp 97.8 F (36.6 C) (Oral)   Ht 5\' 10"  (1.778 m)   Wt 219 lb 3.2 oz (99.4 kg)   BMI 31.45 kg/m   General: Well-nourished, well-developed in no acute distress.  Neuro: Alert and oriented x 3.  Grossly intact. Skin: Warm and dry, no jaundice.   Psych: Alert and cooperative, normal mood and affect.  Labs:    Imaging Studies: No results found.  Assessment and Plan:   Ernest Romero is a 54 y.o. y/o male with HCV and has not heard from the pharmacy. They were called while the patient was here and stated they had tried to contact him multiple times. I was able to connect the the patient with the pharmacy and get the medication sent to him.      53, MD. Midge Minium    Note: This dictation was prepared with Dragon dictation along with smaller phrase technology. Any transcriptional errors that result from this process are unintentional.

## 2020-08-25 ENCOUNTER — Ambulatory Visit: Payer: Medicaid Other | Admitting: Dermatology

## 2020-08-25 ENCOUNTER — Telehealth: Payer: Self-pay

## 2020-08-25 ENCOUNTER — Other Ambulatory Visit: Payer: Self-pay

## 2020-08-25 DIAGNOSIS — L720 Epidermal cyst: Secondary | ICD-10-CM | POA: Diagnosis not present

## 2020-08-25 MED ORDER — MUPIROCIN 2 % EX OINT: 1.0000 "application " | TOPICAL_OINTMENT | Freq: Every day | CUTANEOUS | 0 refills | Status: DC

## 2020-08-25 NOTE — Telephone Encounter (Signed)
Patient doing fine after today's surgery./sh 

## 2020-08-25 NOTE — Progress Notes (Signed)
° °  Follow-Up Visit   Subjective  Ernest Romero is a 54 y.o. male who presents for the following: Cyst (L post neck, pt presents for excision).  The following portions of the chart were reviewed this encounter and updated as appropriate:  Tobacco   Allergies   Meds   Problems   Med Hx   Surg Hx   Fam Hx      Review of Systems:  No other skin or systemic complaints except as noted in HPI or Assessment and Plan.  Objective  Well appearing patient in no apparent distress; mood and affect are within normal limits.  A focused examination was performed including posterior neck. Relevant physical exam findings are noted in the Assessment and Plan.  Objective  Left posterior neck: Cystic pap 2.0cm   Assessment & Plan  Epidermal cyst Left posterior neck  Cyst vs other, L post neck, excised today.  Start Mupirocin oint qd to excision site with dressing changes  Skin excision - Left posterior neck  Lesion length (cm):  2 Lesion width (cm):  2 Margin per side (cm):  0.1 Total excision diameter (cm):  2.2 Informed consent: discussed and consent obtained   Timeout: patient name, date of birth, surgical site, and procedure verified   Procedure prep:  Patient was prepped and draped in usual sterile fashion Prep type:  Isopropyl alcohol and povidone-iodine Anesthesia: the lesion was anesthetized in a standard fashion   Anesthetic:  1% lidocaine w/ epinephrine 1-100,000 buffered w/ 8.4% NaHCO3 (9cc) Instrument used: #15 blade   Hemostasis achieved with: pressure   Hemostasis achieved with comment:  Electrocautery Outcome: patient tolerated procedure well with no complications   Post-procedure details: sterile dressing applied and wound care instructions given   Dressing type: bandage and pressure dressing (Mupirocin)    Skin repair - Left posterior neck Complexity:  Complex Final length (cm):  4 Reason for type of repair: reduce tension to allow closure, reduce the risk of dehiscence,  infection, and necrosis, reduce subcutaneous dead space and avoid a hematoma, allow closure of the large defect, preserve normal anatomy, preserve normal anatomical and functional relationships and enhance both functionality and cosmetic results   Undermining: area extensively undermined   Undermining comment:  Undermining Defect 2.2 Subcutaneous layers (deep stitches):  Suture size:  4-0 Suture type: Vicryl (polyglactin 910)   Subcutaneous suture technique: Inverted Dermal. Fine/surface layer approximation (top stitches):  Suture size:  4-0 Suture type: nylon   Stitches: simple running   Suture removal (days):  7 Hemostasis achieved with: pressure Outcome: patient tolerated procedure well with no complications   Post-procedure details: sterile dressing applied and wound care instructions given   Dressing type: bandage, pressure dressing and bacitracin (Mupirocin)    mupirocin ointment (BACTROBAN) 2 % - Left posterior neck  Specimen 1 - Surgical pathology Differential Diagnosis: D48.5 Cyst vs other Check Margins: No Cystic pap 2.0cm  Return in about 1 week (around 09/01/2020) for suture removal.   I, Ardis Rowan, RMA, am acting as scribe for Armida Sans, MD .  Documentation: I have reviewed the above documentation for accuracy and completeness, and I agree with the above.  Armida Sans, MD

## 2020-08-25 NOTE — Patient Instructions (Signed)

## 2020-08-31 ENCOUNTER — Telehealth: Payer: Self-pay

## 2020-08-31 NOTE — Telephone Encounter (Signed)
Returned patients call. Patient states he will call office back to make an appointment in a few weeks for follow up.

## 2020-09-01 ENCOUNTER — Ambulatory Visit: Payer: Medicaid Other | Admitting: Dermatology

## 2020-09-02 ENCOUNTER — Encounter: Payer: Self-pay | Admitting: Dermatology

## 2020-09-03 ENCOUNTER — Ambulatory Visit (INDEPENDENT_AMBULATORY_CARE_PROVIDER_SITE_OTHER): Payer: Medicaid Other | Admitting: Dermatology

## 2020-09-03 ENCOUNTER — Other Ambulatory Visit: Payer: Self-pay

## 2020-09-03 DIAGNOSIS — Z4802 Encounter for removal of sutures: Secondary | ICD-10-CM

## 2020-09-03 DIAGNOSIS — L720 Epidermal cyst: Secondary | ICD-10-CM

## 2020-09-03 NOTE — Progress Notes (Signed)
   Follow-Up Visit   Subjective  Ernest Romero is a 54 y.o. male who presents for the following: Cyst (L post neck - patient is here today for suture removal ).  The following portions of the chart were reviewed this encounter and updated as appropriate:  Tobacco  Allergies  Meds  Problems  Med Hx  Surg Hx  Fam Hx     Review of Systems:  No other skin or systemic complaints except as noted in HPI or Assessment and Plan.  Objective  Well appearing patient in no apparent distress; mood and affect are within normal limits.  A focused examination was performed including face, neck, chest and back and the post neck. Relevant physical exam findings are noted in the Assessment and Plan.  Objective  post neck: Healing excision site  Assessment & Plan  Epidermal inclusion cyst post neck  Encounter for Removal of Sutures - Incision site at the left posterior neck is clean, dry and intact - Wound cleansed, sutures removed, wound cleansed and steri strips applied.  - Discussed pathology results showing a benign cyst - Patient advised to keep steri-strips dry until they fall off. - Scars remodel for a full year. - Once steri-strips fall off, patient can apply over-the-counter silicone scar cream each night to help with scar remodeling if desired. - Patient advised to call with any concerns or if they notice any new or changing lesions.   Return if symptoms worsen or fail to improve.  Maylene Roes, CMA, am acting as scribe for Armida Sans, MD .  Documentation: I have reviewed the above documentation for accuracy and completeness, and I agree with the above.  Armida Sans, MD

## 2020-09-09 ENCOUNTER — Encounter: Payer: Self-pay | Admitting: Dermatology

## 2020-09-14 ENCOUNTER — Telehealth: Payer: Self-pay | Admitting: Gastroenterology

## 2020-09-14 NOTE — Telephone Encounter (Signed)
Patient states he will finish taking Hep C medication this Friday 12.17.21. Pt wanting to know what next steps are, or if he needs to come back in. Please advise. Pt was seen 10.19.21

## 2020-09-15 NOTE — Telephone Encounter (Signed)
Contacted pt and scheduled return office appt on 10/27/20 for treatment completed appt.

## 2020-10-27 ENCOUNTER — Encounter: Payer: Self-pay | Admitting: Gastroenterology

## 2020-10-27 ENCOUNTER — Ambulatory Visit (INDEPENDENT_AMBULATORY_CARE_PROVIDER_SITE_OTHER): Payer: Medicaid Other | Admitting: Gastroenterology

## 2020-10-27 ENCOUNTER — Other Ambulatory Visit: Payer: Self-pay

## 2020-10-27 VITALS — BP 111/76 | HR 86 | Ht 70.0 in | Wt 226.2 lb

## 2020-10-27 DIAGNOSIS — B182 Chronic viral hepatitis C: Secondary | ICD-10-CM | POA: Diagnosis not present

## 2020-10-27 NOTE — Progress Notes (Signed)
Primary Care Physician: Ernest Fuller, FNP  Primary Gastroenterologist:  Dr. Midge Minium  Chief Complaint  Patient presents with  . Follow-up    Hepatitis C - Treatment completed    HPI: Ernest Romero is a 55 y.o. male here for follow-up after completing treatment for hepatitis C.  The patient states he finished the hepatitis C treatment at the end of last year. He reports that his side effects during treatment was a lot of fatigue.  He states that he is feeling better now.  The patient denies ever having a colonoscopy in the past.  Past Medical History:  Diagnosis Date  . Anxiety   . Bipolar 1 disorder (HCC)   . Depression   . Hepatitis   . Hypertension     Current Outpatient Medications  Medication Sig Dispense Refill  . amLODipine (NORVASC) 5 MG tablet Take 1 tablet (5 mg total) by mouth daily. 90 tablet 1  . Cariprazine HCl (VRAYLAR) 4.5 MG CAPS Take 6 mg by mouth daily.     . clonazePAM (KLONOPIN) 1 MG tablet Take 1 mg by mouth 2 (two) times daily.  2  . LATUDA 40 MG TABS tablet Take 1 tablet (40 mg total) by mouth daily. (Patient not taking: No sig reported) 30 tablet 0  . lithium carbonate (ESKALITH) 450 MG CR tablet Take 1 tablet (450 mg total) by mouth at bedtime. 30 tablet 0  . mupirocin ointment (BACTROBAN) 2 % Apply 1 application topically daily. Qd to excision site on neck (Patient not taking: No sig reported) 22 g 0  . oxyCODONE-acetaminophen (ROXICET) 5-325 MG tablet Take 1 tablet by mouth every 6 (six) hours as needed. (Patient not taking: No sig reported) 12 tablet 0  . risperiDONE (RISPERDAL) 3 MG tablet Take 3 mg by mouth 2 (two) times daily.  (Patient not taking: No sig reported)    . Sofosbuvir-Velpatasvir (EPCLUSA) 400-100 MG TABS Take 1 tablet by mouth daily. (Patient not taking: No sig reported) 28 tablet 2  . SUBOXONE 8-2 MG FILM Place 2.5 strips under the tongue daily.   1   No current facility-administered medications for this visit.     Allergies as of 10/27/2020  . (No Known Allergies)    ROS:  General: Negative for anorexia, weight loss, fever, chills, fatigue, weakness. ENT: Negative for hoarseness, difficulty swallowing , nasal congestion. CV: Negative for chest pain, angina, palpitations, dyspnea on exertion, peripheral edema.  Respiratory: Negative for dyspnea at rest, dyspnea on exertion, cough, sputum, wheezing.  GI: See history of present illness. GU:  Negative for dysuria, hematuria, urinary incontinence, urinary frequency, nocturnal urination.  Endo: Negative for unusual weight change.    Physical Examination:   BP 111/76   Pulse 86   Ht 5\' 10"  (1.778 m)   Wt 226 lb 3.2 oz (102.6 kg)   BMI 32.46 kg/m   General: Well-nourished, well-developed in no acute distress.  Eyes: No icterus. Conjunctivae pink. Lungs: Clear to auscultation bilaterally. Non-labored. Heart: Regular rate and rhythm, no murmurs rubs or gallops.  Abdomen: Bowel sounds are normal, nontender, nondistended, no hepatosplenomegaly or masses, no abdominal bruits or hernia , no rebound or guarding.   Extremities: No lower extremity edema. No clubbing or deformities. Neuro: Alert and oriented x 3.  Grossly intact. Skin: Warm and dry, no jaundice.   Psych: Alert and cooperative, normal mood and affect.  Labs:    Imaging Studies: No results found.  Assessment and Plan:    [name] is a 55 y.o. y/o male who comes in after completing his treatment for hepatitis C.  The patient had a fibrosis scan a few years ago that showed him to be F0-F1 and then a repeat prior to starting treatment showed him to be F3. The patient states that he had symptoms of fatigue during treatment but feels better now.  The patient has been recommends to have a colonoscopy but refuses it. The patient will also have an ultrasound due to his diagnosis of F3.  The patient has been explained the plan and agrees with it.     Midge Minium, MD. Clementeen Graham    Note:  This dictation was prepared with Dragon dictation along with smaller phrase technology. Any transcriptional errors that result from this process are unintentional.

## 2020-11-04 ENCOUNTER — Ambulatory Visit: Payer: Medicaid Other | Attending: Gastroenterology

## 2020-11-04 ENCOUNTER — Telehealth: Payer: Self-pay | Admitting: Gastroenterology

## 2020-11-04 ENCOUNTER — Other Ambulatory Visit: Payer: Self-pay

## 2020-11-04 NOTE — Telephone Encounter (Signed)
Patient did not show for his scheduled ultrasound today

## 2020-11-04 NOTE — Telephone Encounter (Signed)
No show letter mailed to patient requesting him to contact central scheduling to reschedule ultrasound.

## 2020-11-18 ENCOUNTER — Other Ambulatory Visit: Payer: Self-pay

## 2020-11-18 ENCOUNTER — Ambulatory Visit
Admission: RE | Admit: 2020-11-18 | Discharge: 2020-11-18 | Disposition: A | Discharge status: Home / Self Care | Payer: Medicaid Other | Source: Ambulatory Visit | Attending: Gastroenterology | Admitting: Gastroenterology

## 2020-11-18 DIAGNOSIS — B182 Chronic viral hepatitis C: Secondary | ICD-10-CM | POA: Diagnosis not present

## 2020-11-18 DIAGNOSIS — K8020 Calculus of gallbladder without cholecystitis without obstruction: Secondary | ICD-10-CM | POA: Diagnosis not present

## 2020-11-20 LAB — HEPATIC FUNCTION PANEL
ALT: 33 IU/L (ref 0–44)
AST: 33 IU/L (ref 0–40)
Albumin: 4.9 g/dL (ref 3.8–4.9)
Alkaline Phosphatase: 78 IU/L (ref 44–121)
Bilirubin Total: 0.6 mg/dL (ref 0.0–1.2)
Bilirubin, Direct: 0.1 mg/dL (ref 0.00–0.40)
Total Protein: 7.6 g/dL (ref 6.0–8.5)

## 2020-11-20 LAB — HCV RNA QUANT: Hepatitis C Quantitation: NOT DETECTED IU/mL

## 2020-11-23 ENCOUNTER — Telehealth: Payer: Self-pay

## 2020-11-23 ENCOUNTER — Other Ambulatory Visit: Payer: Self-pay

## 2020-11-23 DIAGNOSIS — R935 Abnormal findings on diagnostic imaging of other abdominal regions, including retroperitoneum: Secondary | ICD-10-CM

## 2020-11-23 NOTE — Telephone Encounter (Signed)
LVM for pt to return my call regarding lab and [place] results.

## 2020-11-23 NOTE — Telephone Encounter (Signed)
-----   Message from Midge Minium, MD sent at 11/20/2020  8:03 AM EST ----- Let the patient know the HCV was negative and the liver enzymes were normal.

## 2020-11-23 NOTE — Telephone Encounter (Signed)
-----   Message from Midge Minium, MD sent at 11/22/2020  7:16 PM EST ----- Let the patient know that his ultrasound of his liver showed a significantly dilated bile duct.  This can be caused by an obstruction or some other blockage of the tube draining his liver.  It is recommended that he undergo a MRCP.

## 2020-11-25 NOTE — Telephone Encounter (Signed)
LVM again for pt to return my call.  

## 2020-12-01 ENCOUNTER — Telehealth: Payer: Self-pay | Admitting: Gastroenterology

## 2020-12-01 NOTE — Telephone Encounter (Signed)
Returned pt's call and advised of [place] and lab results. Pt has been scheduled for MRCP on Monday, March 14th  at 5:00pm. Pt has been advised to arrive at 4:30pm and be NPO 4 hours prior.  Pt has been notified of this appt along with prep instructions.

## 2020-12-01 NOTE — Telephone Encounter (Signed)
Pt has been advised of lab and [place] results.

## 2020-12-01 NOTE — Telephone Encounter (Signed)
Patient called LVM asking about results from his test.  Please call to advise

## 2020-12-09 ENCOUNTER — Encounter: Payer: Self-pay | Admitting: Family Medicine

## 2020-12-09 ENCOUNTER — Other Ambulatory Visit: Payer: Self-pay

## 2020-12-09 ENCOUNTER — Ambulatory Visit (INDEPENDENT_AMBULATORY_CARE_PROVIDER_SITE_OTHER): Payer: Medicaid Other | Admitting: Family Medicine

## 2020-12-09 DIAGNOSIS — I10 Essential (primary) hypertension: Secondary | ICD-10-CM

## 2020-12-09 MED ORDER — AMLODIPINE BESYLATE 10 MG PO TABS: 10.0000 mg | ORAL_TABLET | Freq: Every day | ORAL | 1 refills | Status: DC

## 2020-12-09 NOTE — Progress Notes (Signed)
Subjective:    Patient ID: Ernest Romero, male    DOB: 01-11-1966, 55 y.o.   MRN: [id number]  Ernest Romero is a 55 y.o. male presenting on 12/09/2020 for Hypertension  Previous PCP Danielle Rankin, FNP   HPI   CHRONIC HTN: Reports recently out of med, he was given 30 day supply to get to apt. Needs refills. He is due for blood work and would return in future for physical Current Meds - Amlodipine 10mg  daily   Reports good compliance, took meds today. Tolerating well, w/o complaints. Denies CP, dyspnea, HA, edema, dizziness / lightheadedness  Followed by Psychiatry Currently on Lithium, Suboxone, Clonazepam, Vraylar  UTD COVID Vaccine  No flowsheet data found.  Social History   Tobacco Use  . Smoking status: Former  . Smokeless tobacco: Never Used  Vaping Use  . Vaping Use: Every day  . Substances: Nicotine, Flavoring  Substance Use Topics  . Alcohol use: Not Currently    Comment: unknown amount or last drink  . Drug use: Not Currently    Comment: unknown    Review of Systems Per HPI unless specifically indicated above     Objective:    BP 117/82   Pulse 79   Ht 5\' 10"  (1.778 m)   Wt 223 lb 6.4 oz (101.3 kg)   SpO2 98%   BMI 32.05 kg/m   Wt Readings from Last 3 Encounters:  12/09/20 223 lb 6.4 oz (101.3 kg)  10/27/20 226 lb 3.2 oz (102.6 kg)  07/21/20 219 lb 3.2 oz (99.4 kg)    Physical Exam Vitals and nursing note reviewed.  Constitutional:      General: He is not in acute distress.    Appearance: He is well-developed and well-nourished. He is not diaphoretic.     Comments: Well-appearing, comfortable, cooperative  HENT:     Head: Normocephalic and atraumatic.     Mouth/Throat:     Mouth: Oropharynx is clear and moist.  Eyes:     General:        Right eye: No discharge.        Left eye: No discharge.     Conjunctiva/sclera: Conjunctivae normal.  Cardiovascular:     Rate and Rhythm: Normal rate.  Pulmonary:     Effort: Pulmonary effort is  normal.  Musculoskeletal:        General: No edema.  Skin:    General: Skin is warm and dry.     Findings: No erythema or rash.  Neurological:     Mental Status: He is alert and oriented to person, place, and time.  Psychiatric:        Mood and Affect: Mood and affect normal.        Behavior: Behavior normal.     Comments: Well groomed, good eye contact, normal speech and thoughts      Results for orders placed or performed in visit on 10/27/20  HCV RNA quant  Result Value Ref Range   Hepatitis C Quantitation HCV Not Detected IU/mL   Test Information Comment   Hepatic function panel  Result Value Ref Range   Total Protein 7.6 6.0 - 8.5 g/dL   Albumin 4.9 3.8 - 4.9 g/dL   Bilirubin Total 0.6 0.0 - 1.2 mg/dL   Bilirubin, Direct 07/23/20 0.00 - 0.40 mg/dL   Alkaline Phosphatase 78 44 - 121 IU/L   AST 33 0 - 40 IU/L   ALT 33 0 - 44 IU/L  Assessment & Plan:   Problem List Items Addressed This Visit   None   Visit Diagnoses    Essential hypertension       Relevant Medications   amLODipine (NORVASC) 10 MG tablet      HTN controlled Refill Amlodipine 10mg  daily No complications  Meds ordered this encounter  Medications  . amLODipine (NORVASC) 10 MG tablet    Sig: Take 1 tablet (10 mg total) by mouth daily.    Dispense:  90 tablet    Refill:  1      Follow up plan: Return in about 6 months (around 06/11/2021) for 6 month follow-up Annual Physical w/ new provider.  08/11/2021, DO [facility] 12/09/2020, 10:56 AM

## 2020-12-09 NOTE — Patient Instructions (Addendum)
Thank you for coming to the office today.  Refilled Amlodipine 10mg  daily for 90 day with refill up to 6 month  Call back to get apt closer to September   Please schedule a Follow-up Appointment to: Return in about 6 months (around 06/11/2021) for 6 month follow-up Annual Physical w/ new provider.  If you have any other questions or concerns, please feel free to call the office or send a message through MyChart. You may also schedule an earlier appointment if necessary.  Additionally, you may be receiving a survey about your experience at our office within a few days to 1 week by e-mail or mail. We value your feedback.  08/11/2021, DO [facility], [facility]

## 2020-12-13 ENCOUNTER — Ambulatory Visit: Payer: Medicaid Other

## 2020-12-14 ENCOUNTER — Other Ambulatory Visit: Payer: Self-pay | Admitting: Gastroenterology

## 2020-12-14 ENCOUNTER — Ambulatory Visit: Admission: RE | Admit: 2020-12-14 | Payer: Medicaid Other | Source: Ambulatory Visit

## 2020-12-14 DIAGNOSIS — R9389 Abnormal findings on diagnostic imaging of other specified body structures: Secondary | ICD-10-CM

## 2020-12-15 ENCOUNTER — Telehealth: Payer: Self-pay | Admitting: Gastroenterology

## 2020-12-15 NOTE — Telephone Encounter (Signed)
Patient called Ernest Romero that he is ready to set up Ultrasound. Tried to call patient back, no answer and voicemail is full

## 2020-12-16 ENCOUNTER — Telehealth: Payer: Self-pay

## 2020-12-16 NOTE — Telephone Encounter (Signed)
Faxed medical records to [facility] per there request from 10/03/17 to current

## 2021-03-18 ENCOUNTER — Other Ambulatory Visit: Payer: Self-pay

## 2021-03-18 NOTE — Telephone Encounter (Signed)
Unable to reach this pt to schedule MRI. Voicemail is full. Not able to leave messages. Mailed letter.

## 2021-04-12 ENCOUNTER — Telehealth: Payer: Self-pay | Admitting: Gastroenterology

## 2021-04-12 NOTE — Telephone Encounter (Signed)
Please call to reschedule [place] for this patient at [phone]

## 2021-04-13 NOTE — Telephone Encounter (Signed)
Tried contacting pt today and not able to leave message. Call would not go through.

## 2021-04-22 NOTE — Telephone Encounter (Signed)
Tried reaching out to pt again today. Calls still not going through.

## 2021-05-12 ENCOUNTER — Telehealth: Payer: Self-pay | Admitting: Gastroenterology

## 2021-05-12 NOTE — Telephone Encounter (Signed)
Patient calling about a ultrasound that needs to be scheduled. Please call Pete at [phone].

## 2021-05-13 NOTE — Telephone Encounter (Signed)
Pt has been scheduled for an MRI/MRCP on Monday, August 22nd at 3:00pm. Pt [place] been [facility] [place] [facility] [place] 2[place] and to be NPO 4 hours prior to exam.

## 2021-05-24 ENCOUNTER — Ambulatory Visit: Admission: RE | Admit: 2021-05-24 | Payer: Medicaid Other | Source: Ambulatory Visit

## 2021-06-03 ENCOUNTER — Other Ambulatory Visit: Payer: Self-pay | Admitting: Family Medicine

## 2021-06-03 DIAGNOSIS — I10 Essential (primary) hypertension: Secondary | ICD-10-CM

## 2021-06-18 ENCOUNTER — Ambulatory Visit: Admission: RE | Admit: 2021-06-18 | Payer: Medicaid Other | Source: Ambulatory Visit

## 2021-07-14 ENCOUNTER — Telehealth: Payer: Self-pay | Admitting: Gastroenterology

## 2021-07-14 NOTE — Telephone Encounter (Signed)
Pt. Says he did not show up for his MRI he is requesting a call back to reschedule

## 2021-07-15 NOTE — Telephone Encounter (Signed)
Pt has been rescheduled for his MRI on Tuesday, Oct 25th at 1:00pm. Pt advised to [place] [facility] medical mall at 12:30pm and to be NPO 4 hours prior scan. Pt verbalized understanding.

## 2021-07-27 ENCOUNTER — Ambulatory Visit
Admission: RE | Admit: 2021-07-27 | Discharge: 2021-07-27 | Disposition: A | Discharge status: Home / Self Care | Payer: Medicaid Other | Source: Ambulatory Visit | Attending: Gastroenterology | Admitting: Gastroenterology

## 2021-07-27 ENCOUNTER — Other Ambulatory Visit: Payer: Self-pay

## 2021-07-27 DIAGNOSIS — K838 Other specified diseases of biliary tract: Secondary | ICD-10-CM | POA: Diagnosis not present

## 2021-07-27 DIAGNOSIS — R935 Abnormal findings on diagnostic imaging of other abdominal regions, including retroperitoneum: Secondary | ICD-10-CM | POA: Insufficient documentation

## 2021-07-27 DIAGNOSIS — R9389 Abnormal findings on diagnostic imaging of other specified body structures: Secondary | ICD-10-CM | POA: Insufficient documentation

## 2021-07-27 DIAGNOSIS — K828 Other specified diseases of gallbladder: Secondary | ICD-10-CM | POA: Diagnosis not present

## 2021-07-27 MED ORDER — GADOBUTROL 1 MMOL/ML IV SOLN
10.0000 mL | Freq: Once | INTRAVENOUS | Status: AC | PRN
  Administered 2021-07-27: 10 mL via INTRAVENOUS

## 2021-07-30 ENCOUNTER — Telehealth: Payer: Self-pay

## 2021-07-30 NOTE — Telephone Encounter (Signed)
-----   Message from Midge Minium, MD sent at 07/29/2021  4:39 PM EDT ----- The patient know that the MRI did not show the gallbladder wall to be thickened like it was seen on the ultrasound.  The MRI as a better test for that so nothing further needs to be done for the gallbladder.

## 2021-07-30 NOTE — Telephone Encounter (Signed)
Tried contacted pt for results but voicemail box was full.

## 2021-08-02 NOTE — Telephone Encounter (Signed)
Pt notified of MRI results

## 2021-08-02 NOTE — Telephone Encounter (Signed)
-----   Message from Ernest Minium, MD sent at 07/29/2021  4:39 PM EDT ----- The patient know that the MRI did not show the gallbladder wall to be thickened like it was seen on the ultrasound.  The MRI as a better test for that so nothing further needs to be done for the gallbladder.

## 2021-08-24 DIAGNOSIS — F3181 Bipolar II disorder: Secondary | ICD-10-CM | POA: Diagnosis not present

## 2021-09-22 ENCOUNTER — Other Ambulatory Visit: Payer: Self-pay | Admitting: Family Medicine

## 2021-09-22 DIAGNOSIS — I10 Essential (primary) hypertension: Secondary | ICD-10-CM

## 2021-09-23 ENCOUNTER — Ambulatory Visit: Payer: Medicaid Other | Admitting: Internal Medicine

## 2021-09-23 NOTE — Progress Notes (Deleted)
Subjective:    Patient ID: Ernest Romero, male    DOB: [birth year], 55 y.o.   MRN: [id number]  HPI  Patient presents the clinic today for follow-up of chronic conditions.  He is establishing care with me today, transferring care from Malva Cogan, NP.  HTN: His BP today is.  He is taking Amlodipine as prescribed.  ECG from 11/2016 reviewed.  Bipolar Disorder: Chronic, managed on Vraylar, Clonazepam, Risperdal and Lithium.  He follows with psychiatry.  He denies SI/HI.  History of Hep C: In remission status post antiviral therapy.  He no longer follows with GI or the hep C clinic.  Review of Systems  Past Medical History:  Diagnosis Date   Anxiety    Bipolar 1 disorder (HCC)    Depression    Hepatitis    Hypertension     Current Outpatient Medications  Medication Sig Dispense Refill   amLODipine (NORVASC) 10 MG tablet Take 1 tablet by mouth once daily 90 tablet 0   Cariprazine HCl (VRAYLAR) 4.5 MG CAPS Take 6 mg by mouth daily.      clonazePAM (KLONOPIN) 1 MG tablet Take 1 mg by mouth 2 (two) times daily.  2   lithium carbonate (ESKALITH) 450 MG CR tablet Take 1 tablet (450 mg total) by mouth at bedtime. 30 tablet 0   mupirocin ointment (BACTROBAN) 2 % Apply 1 application topically daily. Qd to excision site on neck 22 g 0   risperiDONE (RISPERDAL) 3 MG tablet Take 3 mg by mouth 2 (two) times daily.     Sofosbuvir-Velpatasvir (EPCLUSA) 400-100 MG TABS Take 1 tablet by mouth daily. 28 tablet 2   SUBOXONE 8-2 MG FILM Place 2.5 strips under the tongue daily.   1   No current facility-administered medications for this visit.    No Known Allergies  No family history on file.  Social History   Socioeconomic History   Marital status: Widowed    Spouse name: Not on file   Number of children: Not on file   Years of education: Not on file   Highest education level: Not on file  Occupational History   Not on file  Tobacco Use   Smoking status: Former   Smokeless tobacco:  Never  Vaping Use   Vaping Use: Every day   Substances: Nicotine, Flavoring  Substance and Sexual Activity   Alcohol use: Not Currently    Comment: unknown amount or last drink   Drug use: Not Currently    Comment: unknown   Sexual activity: Never  Other Topics Concern   Not on file  Social History Narrative   Not on file   Social Determinants of Health   Financial Resource Strain: Not on file  Food Insecurity: Not on file  Transportation Needs: Not on file  Physical Activity: Not on file  Stress: Not on file  Social Connections: Not on file  Intimate Partner Violence: Not on file     Constitutional: Denies fever, malaise, fatigue, headache or abrupt weight changes.  HEENT: Denies eye pain, eye redness, ear pain, ringing in the ears, wax buildup, runny nose, nasal congestion, bloody nose, or sore throat. Respiratory: Denies difficulty breathing, shortness of breath, cough or sputum production.   Cardiovascular: Denies chest pain, chest tightness, palpitations or swelling in the hands or feet.  Gastrointestinal: Denies abdominal pain, bloating, constipation, diarrhea or blood in the stool.  GU: Denies urgency, frequency, pain with urination, burning sensation, blood in urine, odor or discharge. Musculoskeletal:  Denies decrease in range of motion, difficulty with gait, muscle pain or joint pain and swelling.  Skin: Denies redness, rashes, lesions or ulcercations.  Neurological: Denies dizziness, difficulty with memory, difficulty with speech or problems with balance and coordination.  Psych: Patient has a history of depression.  Denies anxiety, SI/HI.  No other specific complaints in a complete review of systems (except as listed in HPI above).     Objective:   Physical Exam   There were no vitals taken for this visit. Wt Readings from Last 3 Encounters:  12/09/20 223 lb 6.4 oz (101.3 kg)  10/27/20 226 lb 3.2 oz (102.6 kg)  07/21/20 219 lb 3.2 oz (99.4 kg)     General: Appears their stated age, well developed, well nourished in NAD. Skin: Warm, dry and intact. No rashes, lesions or ulcerations noted. HEENT: Head: normal shape and size; Eyes: sclera white, no icterus, conjunctiva pink, PERRLA and EOMs intact; Ears: Tm's gray and intact, normal light reflex; Nose: mucosa pink and moist, septum midline; Throat/Mouth: Teeth present, mucosa pink and moist, no exudate, lesions or ulcerations noted.  Neck:  Neck supple, trachea midline. No masses, lumps or thyromegaly present.  Cardiovascular: Normal rate and rhythm. S1,S2 noted.  No murmur, rubs or gallops noted. No JVD or BLE edema. No carotid bruits noted. Pulmonary/Chest: Normal effort and positive vesicular breath sounds. No respiratory distress. No wheezes, rales or ronchi noted.  Abdomen: Soft and nontender. Normal bowel sounds. No distention or masses noted. Liver, spleen and kidneys non palpable. Musculoskeletal: Normal range of motion. No signs of joint swelling. No difficulty with gait.  Neurological: Alert and oriented. Cranial nerves II-XII grossly intact. Coordination normal.  Psychiatric: Mood and affect normal. Behavior is normal. Judgment and thought content normal.   BMET    Component Value Date/Time   NA 141 09/16/2018 1415   NA 138 10/16/2014 1042   K 3.2 (L) 09/16/2018 1415   K 4.2 10/16/2014 1042   CL 107 09/16/2018 1415   CL 105 10/16/2014 1042   CO2 28 09/16/2018 1415   CO2 27 10/16/2014 1042   GLUCOSE 129 (H) 09/16/2018 1415   GLUCOSE 72 10/16/2014 1042   BUN 11 09/16/2018 1415   BUN 14 10/16/2014 1042   CREATININE 1.04 09/16/2018 1415   CREATININE 1.39 (H) 10/16/2014 1042   CALCIUM 9.0 09/16/2018 1415   CALCIUM 9.6 10/16/2014 1042   GFRNONAA >60 09/16/2018 1415   GFRNONAA 57 (L) 10/16/2014 1042   GFRNONAA >60 03/09/2014 1233   GFRAA >60 09/16/2018 1415   GFRAA >60 10/16/2014 1042   GFRAA >60 03/09/2014 1233    Lipid Panel  No results found for: CHOL, TRIG,  HDL, CHOLHDL, VLDL, LDLCALC  CBC    Component Value Date/Time   WBC 6.9 09/16/2018 1415   RBC 5.23 09/16/2018 1415   HGB 15.7 09/16/2018 1415   HGB 16.0 10/16/2014 1042   HCT 46.0 09/16/2018 1415   HCT 48.5 10/16/2014 1042   PLT 251 09/16/2018 1415   PLT 222 10/16/2014 1042   MCV 88.0 09/16/2018 1415   MCV 91 10/16/2014 1042   MCH 30.0 09/16/2018 1415   MCHC 34.1 09/16/2018 1415   RDW 12.4 09/16/2018 1415   RDW 13.2 10/16/2014 1042   LYMPHSABS 2.6 05/29/2016 1423   MONOABS 1.0 05/29/2016 1423   EOSABS 0.0 05/29/2016 1423   BASOSABS 0.1 05/29/2016 1423    Hgb A1C No results found for: HGBA1C         Assessment &  Plan:    Nicki Reaper, NP This visit occurred during the SARS-CoV-2 public health emergency.  Safety protocols were in place, including screening questions prior to the visit, additional usage of staff PPE, and extensive cleaning of exam room while observing appropriate contact time as indicated for disinfecting solutions.

## 2021-10-11 ENCOUNTER — Ambulatory Visit: Payer: Medicaid Other | Admitting: Internal Medicine

## 2021-10-11 NOTE — Progress Notes (Deleted)
Subjective:    Patient ID: Ernest Romero, male    DOB: March 03, 1966, 56 y.o.   MRN: [id number]  HPI  Patient presents the clinic today for follow-up of chronic conditions.  He is establishing care with me today, transferring care from Malva Cogan, NP.  HTN: His BP today is.  He is taking Amlodipine as prescribed.  ECG from 11/2016 reviewed.  Bipolar Depression: Chronic, managed on Vraylar, Lithium, Clonazepam and Risperdal.  He is not currently seeing a therapist.  He follows with he denies anxiety, SI/HI.  Review of Systems  Past Medical History:  Diagnosis Date   Anxiety    Bipolar 1 disorder (HCC)    Depression    Hepatitis    Hypertension     Current Outpatient Medications  Medication Sig Dispense Refill   amLODipine (NORVASC) 10 MG tablet Take 1 tablet by mouth once daily 90 tablet 0   Cariprazine HCl (VRAYLAR) 4.5 MG CAPS Take 6 mg by mouth daily.      clonazePAM (KLONOPIN) 1 MG tablet Take 1 mg by mouth 2 (two) times daily.  2   lithium carbonate (ESKALITH) 450 MG CR tablet Take 1 tablet (450 mg total) by mouth at bedtime. 30 tablet 0   mupirocin ointment (BACTROBAN) 2 % Apply 1 application topically daily. Qd to excision site on neck 22 g 0   risperiDONE (RISPERDAL) 3 MG tablet Take 3 mg by mouth 2 (two) times daily.     Sofosbuvir-Velpatasvir (EPCLUSA) 400-100 MG TABS Take 1 tablet by mouth daily. 28 tablet 2   SUBOXONE 8-2 MG FILM Place 2.5 strips under the tongue daily.   1   No current facility-administered medications for this visit.    No Known Allergies  No family history on file.  Social History   Socioeconomic History   Marital status: Widowed    Spouse name: Not on file   Number of children: Not on file   Years of education: Not on file   Highest education level: Not on file  Occupational History   Not on file  Tobacco Use   Smoking status: Former   Smokeless tobacco: Never  Vaping Use   Vaping Use: Every day   Substances: Nicotine, Flavoring   Substance and Sexual Activity   Alcohol use: Not Currently    Comment: unknown amount or last drink   Drug use: Not Currently    Comment: unknown   Sexual activity: Never  Other Topics Concern   Not on file  Social History Narrative   Not on file   Social Determinants of Health   Financial Resource Strain: Not on file  Food Insecurity: Not on file  Transportation Needs: Not on file  Physical Activity: Not on file  Stress: Not on file  Social Connections: Not on file  Intimate Partner Violence: Not on file     Constitutional: Denies fever, malaise, fatigue, headache or abrupt weight changes.  HEENT: Denies eye pain, eye redness, ear pain, ringing in the ears, wax buildup, runny nose, nasal congestion, bloody nose, or sore throat. Respiratory: Denies difficulty breathing, shortness of breath, cough or sputum production.   Cardiovascular: Denies chest pain, chest tightness, palpitations or swelling in the hands or feet.  Gastrointestinal: Denies abdominal pain, bloating, constipation, diarrhea or blood in the stool.  GU: Denies urgency, frequency, pain with urination, burning sensation, blood in urine, odor or discharge. Musculoskeletal: Denies decrease in range of motion, difficulty with gait, muscle pain or joint pain and swelling.  Skin: Denies redness, rashes, lesions or ulcercations.  Neurological: Denies dizziness, difficulty with memory, difficulty with speech or problems with balance and coordination.  Psych: Patient has a history of depression.  Denies anxiety, SI/HI.  No other specific complaints in a complete review of systems (except as listed in HPI above).     Objective:   Physical Exam   There were no vitals taken for this visit. Wt Readings from Last 3 Encounters:  12/09/20 223 lb 6.4 oz (101.3 kg)  10/27/20 226 lb 3.2 oz (102.6 kg)  07/21/20 219 lb 3.2 oz (99.4 kg)    General: Appears their stated age, well developed, well nourished in NAD. Skin: Warm,  dry and intact. No rashes, lesions or ulcerations noted. HEENT: Head: normal shape and size; Eyes: sclera white, no icterus, conjunctiva pink, PERRLA and EOMs intact; Ears: Tm's gray and intact, normal light reflex; Nose: mucosa pink and moist, septum midline; Throat/Mouth: Teeth present, mucosa pink and moist, no exudate, lesions or ulcerations noted.  Neck:  Neck supple, trachea midline. No masses, lumps or thyromegaly present.  Cardiovascular: Normal rate and rhythm. S1,S2 noted.  No murmur, rubs or gallops noted. No JVD or BLE edema. No carotid bruits noted. Pulmonary/Chest: Normal effort and positive vesicular breath sounds. No respiratory distress. No wheezes, rales or ronchi noted.  Abdomen: Soft and nontender. Normal bowel sounds. No distention or masses noted. Liver, spleen and kidneys non palpable. Musculoskeletal: Normal range of motion. No signs of joint swelling. No difficulty with gait.  Neurological: Alert and oriented. Cranial nerves II-XII grossly intact. Coordination normal.  Psychiatric: Mood and affect normal. Behavior is normal. Judgment and thought content normal.    BMET    Component Value Date/Time   NA 141 09/16/2018 1415   NA 138 10/16/2014 1042   K 3.2 (L) 09/16/2018 1415   K 4.2 10/16/2014 1042   CL 107 09/16/2018 1415   CL 105 10/16/2014 1042   CO2 28 09/16/2018 1415   CO2 27 10/16/2014 1042   GLUCOSE 129 (H) 09/16/2018 1415   GLUCOSE 72 10/16/2014 1042   BUN 11 09/16/2018 1415   BUN 14 10/16/2014 1042   CREATININE 1.04 09/16/2018 1415   CREATININE 1.39 (H) 10/16/2014 1042   CALCIUM 9.0 09/16/2018 1415   CALCIUM 9.6 10/16/2014 1042   GFRNONAA >60 09/16/2018 1415   GFRNONAA 57 (L) 10/16/2014 1042   GFRNONAA >60 03/09/2014 1233   GFRAA >60 09/16/2018 1415   GFRAA >60 10/16/2014 1042   GFRAA >60 03/09/2014 1233    Lipid Panel  No results found for: CHOL, TRIG, HDL, CHOLHDL, VLDL, LDLCALC  CBC    Component Value Date/Time   WBC 6.9 09/16/2018 1415    RBC 5.23 09/16/2018 1415   HGB 15.7 09/16/2018 1415   HGB 16.0 10/16/2014 1042   HCT 46.0 09/16/2018 1415   HCT 48.5 10/16/2014 1042   PLT 251 09/16/2018 1415   PLT 222 10/16/2014 1042   MCV 88.0 09/16/2018 1415   MCV 91 10/16/2014 1042   MCH 30.0 09/16/2018 1415   MCHC 34.1 09/16/2018 1415   RDW 12.4 09/16/2018 1415   RDW 13.2 10/16/2014 1042   LYMPHSABS 2.6 05/29/2016 1423   MONOABS 1.0 05/29/2016 1423   EOSABS 0.0 05/29/2016 1423   BASOSABS 0.1 05/29/2016 1423    Hgb A1C No results found for: HGBA1C         Assessment & Plan:    Nicki Reaperegina Keshanna Riso, NP This visit occurred during the SARS-CoV-2 public health emergency.  Safety protocols were in place, including screening questions prior to the visit, additional usage of staff PPE, and extensive cleaning of exam room while observing appropriate contact time as indicated for disinfecting solutions.

## 2021-11-02 ENCOUNTER — Other Ambulatory Visit: Payer: Self-pay | Admitting: Family Medicine

## 2021-11-02 DIAGNOSIS — I10 Essential (primary) hypertension: Secondary | ICD-10-CM

## 2021-11-02 NOTE — Telephone Encounter (Signed)
Requested medication (s) are due for refill today: yes  Requested medication (s) are on the active medication list: yes  Last refill:  06/03/21 #90 with NO RF  Future visit scheduled: no, NO SHOW 09/23/21 and 10/11/21  Notes to clinic:  Has already had a curtesy refill and there is no upcoming appointment scheduled.   Requested Prescriptions  Pending Prescriptions Disp Refills   amLODipine (NORVASC) 10 MG tablet [Pharmacy Med Name: amLODIPine Besylate 10 MG Oral Tablet] 90 tablet 0    Sig: Take 1 tablet by mouth once daily     Cardiovascular: Calcium Channel Blockers 2 Failed - 11/02/2021  3:32 PM      Failed - Valid encounter within last 6 months    Recent Outpatient Visits           10 months ago Essential hypertension   [place], [place]   1 year ago No-show for appointment   [facility], Lupita Raider, FNP   1 year ago Essential hypertension   [facility], Lupita Raider, [place]              Passed - Last BP in normal range    BP Readings from Last 1 Encounters:  12/09/20 117/82          Passed - Last Heart Rate in normal range    Pulse Readings from Last 1 Encounters:  12/09/20 79

## 2022-02-21 ENCOUNTER — Other Ambulatory Visit: Payer: Self-pay | Admitting: Family Medicine

## 2022-02-21 DIAGNOSIS — I10 Essential (primary) hypertension: Secondary | ICD-10-CM

## 2022-02-22 NOTE — Telephone Encounter (Signed)
Requested medications are due for refill today.  unsure  Requested medications are on the active medications list.  yes  Last refill. 06/03/2021 #90 0 refills  Future visit scheduled.   no  Notes to clinic.  Pt is more than 3 months over due for an OV.    Requested Prescriptions  Pending Prescriptions Disp Refills   amLODipine (NORVASC) 10 MG tablet [Pharmacy Med Name: amLODIPine Besylate 10 MG Oral Tablet] 90 tablet 0    Sig: Take 1 tablet by mouth once daily     Cardiovascular: Calcium Channel Blockers 2 Failed - 02/21/2022 12:38 PM      Failed - Valid encounter within last 6 months    Recent Outpatient Visits           1 year ago Essential hypertension   [facility] [place], Netta Neat, DO   1 year ago No-show for appointment   [facility], Jodelle Gross, FNP   1 year ago Essential hypertension   [facility], Jodelle Gross, [place]               Passed - Last BP in normal range    BP Readings from Last 1 Encounters:  12/09/20 117/82         Passed - Last Heart Rate in normal range    Pulse Readings from Last 1 Encounters:  12/09/20 79

## 2022-02-23 ENCOUNTER — Ambulatory Visit: Payer: Self-pay

## 2022-02-23 NOTE — Telephone Encounter (Signed)
Chief Complaint: High BP Symptoms: Anxiety due to no BP, face flushed/red, heart racing, difficulty breathing Frequency: Been out of medicine x 2 months, symptoms started today, BP up on Monday SBP 170's (doesn't know the reading and no monitor at home) Pertinent Negatives: Patient denies thoughts of suicide Disposition: [] ED /[x] Urgent Care (no appt availability in office) / [] Appointment(In office/virtual)/ []  [facility]/ [] Home Care/ [] Refused Recommended Disposition /[] [facility] [facility]/ []  Follow-up with PCP Additional Notes: Advised UC due to patient will need appointment in order to refill Amlodipine and he is wanting the medication today, advised will need CPE scheduled, he agreed to schedule first available, scheduled 03/07/22 with Dr. , patient will go to [facility] clinic now, friend is with him and will drive him there.   Reason for Disposition  [1] Systolic BP  >= 160 OR Diastolic >= 100 AND [2] cardiac or neurologic symptoms (e.g., chest pain, difficulty breathing, unsteady gait, blurred vision)  Answer Assessment - Initial Assessment Questions 1. BLOOD PRESSURE: "What is the blood pressure?" "Did you take at least two measurements 5 minutes apart?"     Not able to check, it was high on Monday 2. ONSET: "When did you take your blood pressure?"     Monday 3. HOW: "How did you obtain the blood pressure?" (e.g., visiting nurse, automatic home BP monitor)     Automatic BP monitor at OV 4. HISTORY: "Do you have a history of high blood pressure?"     Yes 5. MEDICATIONS: "Are you taking any medications for blood pressure?" "Have you missed any doses recently?"     Amlodipine, off x 1-2 months 6. OTHER SYMPTOMS: "Do you have any symptoms?" (e.g., headache, chest pain, blurred vision, difficulty breathing, weakness)     Face red, anxiety symptoms (difficulty breathing, heart racing) 7. PREGNANCY: "Is there any chance you are pregnant?" "When was your last  menstrual period?"     N/A  Protocols used: Blood Pressure - High-A-AH

## 2022-03-07 ENCOUNTER — Encounter: Payer: Medicaid Other | Admitting: Family Medicine

## 2022-03-24 ENCOUNTER — Ambulatory Visit: Payer: Medicaid Other | Admitting: Internal Medicine

## 2022-03-24 NOTE — Progress Notes (Deleted)
Subjective:    Patient ID: Ernest Romero, male    DOB: 10-Dec-1965, 56 y.o.   MRN: [id number]  HPI  Patient presents to clinic today for follow-up of chronic conditions.  He is establishing care with me today, transferring care from Malva Cogan, NP.  HTN: His BP today is.  He is taking Amlodipine as prescribed.  ECG from 11/2016 reviewed.  Bipolar Depression: Chronic, managed on Vraylar, Clonazepam, Lithium and Risperdal.  He is not currently seeing a therapist but does follow with psychiatry.  He denies SI/HI.  History of Hep C: In remission status post antiviral therapy.  He no longer follows with GI.  Review of Systems     Past Medical History:  Diagnosis Date   Anxiety    Bipolar 1 disorder (HCC)    Depression    Hepatitis    Hypertension     Current Outpatient Medications  Medication Sig Dispense Refill   amLODipine (NORVASC) 10 MG tablet Take 1 tablet by mouth once daily 90 tablet 0   Cariprazine HCl (VRAYLAR) 4.5 MG CAPS Take 6 mg by mouth daily.      clonazePAM (KLONOPIN) 1 MG tablet Take 1 mg by mouth 2 (two) times daily.  2   lithium carbonate (ESKALITH) 450 MG CR tablet Take 1 tablet (450 mg total) by mouth at bedtime. 30 tablet 0   mupirocin ointment (BACTROBAN) 2 % Apply 1 application topically daily. Qd to excision site on neck 22 g 0   risperiDONE (RISPERDAL) 3 MG tablet Take 3 mg by mouth 2 (two) times daily.     Sofosbuvir-Velpatasvir (EPCLUSA) 400-100 MG TABS Take 1 tablet by mouth daily. 28 tablet 2   SUBOXONE 8-2 MG FILM Place 2.5 strips under the tongue daily.   1   No current facility-administered medications for this visit.    No Known Allergies  No family history on file.  Social History   Socioeconomic History   Marital status: Widowed    Spouse name: Not on file   Number of children: Not on file   Years of education: Not on file   Highest education level: Not on file  Occupational History   Not on file  Tobacco Use   Smoking status:  Former   Smokeless tobacco: Never  Vaping Use   Vaping Use: Every day   Substances: Nicotine, Flavoring  Substance and Sexual Activity   Alcohol use: Not Currently    Comment: unknown amount or last drink   Drug use: Not Currently    Comment: unknown   Sexual activity: Never  Other Topics Concern   Not on file  Social History Narrative   Not on file   Social Determinants of Health   Financial Resource Strain: Not on file  Food Insecurity: Not on file  Transportation Needs: Not on file  Physical Activity: Not on file  Stress: Not on file  Social Connections: Not on file  Intimate Partner Violence: Not on file     Constitutional: Denies fever, malaise, fatigue, headache or abrupt weight changes.  HEENT: Denies eye pain, eye redness, ear pain, ringing in the ears, wax buildup, runny nose, nasal congestion, bloody nose, or sore throat. Respiratory: Denies difficulty breathing, shortness of breath, cough or sputum production.   Cardiovascular: Denies chest pain, chest tightness, palpitations or swelling in the hands or feet.  Gastrointestinal: Denies abdominal pain, bloating, constipation, diarrhea or blood in the stool.  GU: Denies urgency, frequency, pain with urination, burning sensation, blood  in urine, odor or discharge. Musculoskeletal: Denies decrease in range of motion, difficulty with gait, muscle pain or joint pain and swelling.  Skin: Denies redness, rashes, lesions or ulcercations.  Neurological: Denies dizziness, difficulty with memory, difficulty with speech or problems with balance and coordination.  Psych: Patient has a history of depression.  Denies anxiety, SI/HI.  No other specific complaints in a complete review of systems (except as listed in HPI above).  Objective:   Physical Exam   There were no vitals taken for this visit. Wt Readings from Last 3 Encounters:  12/09/20 223 lb 6.4 oz (101.3 kg)  10/27/20 226 lb 3.2 oz (102.6 kg)  07/21/20 219 lb 3.2  oz (99.4 kg)    General: Appears their stated age, well developed, well nourished in NAD. Skin: Warm, dry and intact. No rashes, lesions or ulcerations noted. HEENT: Head: normal shape and size; Eyes: sclera white, no icterus, conjunctiva pink, PERRLA and EOMs intact; Ears: Tm's gray and intact, normal light reflex; Nose: mucosa pink and moist, septum midline; Throat/Mouth: Teeth present, mucosa pink and moist, no exudate, lesions or ulcerations noted.  Neck:  Neck supple, trachea midline. No masses, lumps or thyromegaly present.  Cardiovascular: Normal rate and rhythm. S1,S2 noted.  No murmur, rubs or gallops noted. No JVD or BLE edema. No carotid bruits noted. Pulmonary/Chest: Normal effort and positive vesicular breath sounds. No respiratory distress. No wheezes, rales or ronchi noted.  Abdomen: Soft and nontender. Normal bowel sounds. No distention or masses noted. Liver, spleen and kidneys non palpable. Musculoskeletal: Normal range of motion. No signs of joint swelling. No difficulty with gait.  Neurological: Alert and oriented. Cranial nerves II-XII grossly intact. Coordination normal.  Psychiatric: Mood and affect normal. Behavior is normal. Judgment and thought content normal.    BMET    Component Value Date/Time   NA 141 09/16/2018 1415   NA 138 10/16/2014 1042   K 3.2 (L) 09/16/2018 1415   K 4.2 10/16/2014 1042   CL 107 09/16/2018 1415   CL 105 10/16/2014 1042   CO2 28 09/16/2018 1415   CO2 27 10/16/2014 1042   GLUCOSE 129 (H) 09/16/2018 1415   GLUCOSE 72 10/16/2014 1042   BUN 11 09/16/2018 1415   BUN 14 10/16/2014 1042   CREATININE 1.04 09/16/2018 1415   CREATININE 1.39 (H) 10/16/2014 1042   CALCIUM 9.0 09/16/2018 1415   CALCIUM 9.6 10/16/2014 1042   GFRNONAA >60 09/16/2018 1415   GFRNONAA 57 (L) 10/16/2014 1042   GFRNONAA >60 03/09/2014 1233   GFRAA >60 09/16/2018 1415   GFRAA >60 10/16/2014 1042   GFRAA >60 03/09/2014 1233    Lipid Panel  No results found for:  "CHOL", "TRIG", "HDL", "CHOLHDL", "VLDL", "LDLCALC"  CBC    Component Value Date/Time   WBC 6.9 09/16/2018 1415   RBC 5.23 09/16/2018 1415   HGB 15.7 09/16/2018 1415   HGB 16.0 10/16/2014 1042   HCT 46.0 09/16/2018 1415   HCT 48.5 10/16/2014 1042   PLT 251 09/16/2018 1415   PLT 222 10/16/2014 1042   MCV 88.0 09/16/2018 1415   MCV 91 10/16/2014 1042   MCH 30.0 09/16/2018 1415   MCHC 34.1 09/16/2018 1415   RDW 12.4 09/16/2018 1415   RDW 13.2 10/16/2014 1042   LYMPHSABS 2.6 05/29/2016 1423   MONOABS 1.0 05/29/2016 1423   EOSABS 0.0 05/29/2016 1423   BASOSABS 0.1 05/29/2016 1423    Hgb A1C No results found for: "HGBA1C"  Assessment & Plan:     Nicki Reaper, NP

## 2022-03-29 ENCOUNTER — Ambulatory Visit: Payer: Medicaid Other | Admitting: Internal Medicine

## 2022-03-29 NOTE — Progress Notes (Deleted)
Subjective:    Patient ID: Ernest Romero, male    DOB: 1965-12-06, 56 y.o.   MRN: [id number]  HPI  Patient presents to clinic today for follow-up of chronic conditions.  He is establishing care with me today, transferring care from Malva Cogan, NP.  HTN: His BP today is.  He is taking Amlodipine as prescribed.  ECG from 11/2016 reviewed.  History of Hep C: Status post antiviral therapy.  He no longer follows with the liver care clinic.  Bipolar Depression: Chronic, managed on Vraylar, Lithium, Risperidone and Clonazepam.  He follows with psychiatry.  He is currently seeing a therapist.  He denies anxiety, SI/HI.  Review of Systems     Past Medical History:  Diagnosis Date   Anxiety    Bipolar 1 disorder (HCC)    Depression    Hepatitis    Hypertension     Current Outpatient Medications  Medication Sig Dispense Refill   amLODipine (NORVASC) 10 MG tablet Take 1 tablet by mouth once daily 90 tablet 0   Cariprazine HCl (VRAYLAR) 4.5 MG CAPS Take 6 mg by mouth daily.      clonazePAM (KLONOPIN) 1 MG tablet Take 1 mg by mouth 2 (two) times daily.  2   lithium carbonate (ESKALITH) 450 MG CR tablet Take 1 tablet (450 mg total) by mouth at bedtime. 30 tablet 0   mupirocin ointment (BACTROBAN) 2 % Apply 1 application topically daily. Qd to excision site on neck 22 g 0   risperiDONE (RISPERDAL) 3 MG tablet Take 3 mg by mouth 2 (two) times daily.     Sofosbuvir-Velpatasvir (EPCLUSA) 400-100 MG TABS Take 1 tablet by mouth daily. 28 tablet 2   SUBOXONE 8-2 MG FILM Place 2.5 strips under the tongue daily.   1   No current facility-administered medications for this visit.    No Known Allergies  No family history on file.  Social History   Socioeconomic History   Marital status: Widowed    Spouse name: Not on file   Number of children: Not on file   Years of education: Not on file   Highest education level: Not on file  Occupational History   Not on file  Tobacco Use   Smoking  status: Former   Smokeless tobacco: Never  Vaping Use   Vaping Use: Every day   Substances: Nicotine, Flavoring  Substance and Sexual Activity   Alcohol use: Not Currently    Comment: unknown amount or last drink   Drug use: Not Currently    Comment: unknown   Sexual activity: Never  Other Topics Concern   Not on file  Social History Narrative   Not on file   Social Determinants of Health   Financial Resource Strain: Not on file  Food Insecurity: Not on file  Transportation Needs: Not on file  Physical Activity: Not on file  Stress: Not on file  Social Connections: Not on file  Intimate Partner Violence: Not on file     Constitutional: Denies fever, malaise, fatigue, headache or abrupt weight changes.  HEENT: Denies eye pain, eye redness, ear pain, ringing in the ears, wax buildup, runny nose, nasal congestion, bloody nose, or sore throat. Respiratory: Denies difficulty breathing, shortness of breath, cough or sputum production.   Cardiovascular: Denies chest pain, chest tightness, palpitations or swelling in the hands or feet.  Gastrointestinal: Denies abdominal pain, bloating, constipation, diarrhea or blood in the stool.  GU: Denies urgency, frequency, pain with urination, burning sensation,  blood in urine, odor or discharge. Musculoskeletal: Denies decrease in range of motion, difficulty with gait, muscle pain or joint pain and swelling.  Skin: Denies redness, rashes, lesions or ulcercations.  Neurological: Denies dizziness, difficulty with memory, difficulty with speech or problems with balance and coordination.  Psych: Patient has a history of depression.  Denies anxiety, SI/HI.  No other specific complaints in a complete review of systems (except as listed in HPI above).  Objective:   Physical Exam   There were no vitals taken for this visit. Wt Readings from Last 3 Encounters:  12/09/20 223 lb 6.4 oz (101.3 kg)  10/27/20 226 lb 3.2 oz (102.6 kg)  07/21/20 219  lb 3.2 oz (99.4 kg)    General: Appears their stated age, well developed, well nourished in NAD. Skin: Warm, dry and intact. No rashes, lesions or ulcerations noted. HEENT: Head: normal shape and size; Eyes: sclera white, no icterus, conjunctiva pink, PERRLA and EOMs intact; Ears: Tm's gray and intact, normal light reflex; Nose: mucosa pink and moist, septum midline; Throat/Mouth: Teeth present, mucosa pink and moist, no exudate, lesions or ulcerations noted.  Neck:  Neck supple, trachea midline. No masses, lumps or thyromegaly present.  Cardiovascular: Normal rate and rhythm. S1,S2 noted.  No murmur, rubs or gallops noted. No JVD or BLE edema. No carotid bruits noted. Pulmonary/Chest: Normal effort and positive vesicular breath sounds. No respiratory distress. No wheezes, rales or ronchi noted.  Abdomen: Soft and nontender. Normal bowel sounds. No distention or masses noted. Liver, spleen and kidneys non palpable. Musculoskeletal: Normal range of motion. No signs of joint swelling. No difficulty with gait.  Neurological: Alert and oriented. Cranial nerves II-XII grossly intact. Coordination normal.  Psychiatric: Mood and affect normal. Behavior is normal. Judgment and thought content normal.    BMET    Component Value Date/Time   NA 141 09/16/2018 1415   NA 138 10/16/2014 1042   K 3.2 (L) 09/16/2018 1415   K 4.2 10/16/2014 1042   CL 107 09/16/2018 1415   CL 105 10/16/2014 1042   CO2 28 09/16/2018 1415   CO2 27 10/16/2014 1042   GLUCOSE 129 (H) 09/16/2018 1415   GLUCOSE 72 10/16/2014 1042   BUN 11 09/16/2018 1415   BUN 14 10/16/2014 1042   CREATININE 1.04 09/16/2018 1415   CREATININE 1.39 (H) 10/16/2014 1042   CALCIUM 9.0 09/16/2018 1415   CALCIUM 9.6 10/16/2014 1042   GFRNONAA >60 09/16/2018 1415   GFRNONAA 57 (L) 10/16/2014 1042   GFRNONAA >60 03/09/2014 1233   GFRAA >60 09/16/2018 1415   GFRAA >60 10/16/2014 1042   GFRAA >60 03/09/2014 1233    Lipid Panel  No results  found for: "CHOL", "TRIG", "HDL", "CHOLHDL", "VLDL", "LDLCALC"  CBC    Component Value Date/Time   WBC 6.9 09/16/2018 1415   RBC 5.23 09/16/2018 1415   HGB 15.7 09/16/2018 1415   HGB 16.0 10/16/2014 1042   HCT 46.0 09/16/2018 1415   HCT 48.5 10/16/2014 1042   PLT 251 09/16/2018 1415   PLT 222 10/16/2014 1042   MCV 88.0 09/16/2018 1415   MCV 91 10/16/2014 1042   MCH 30.0 09/16/2018 1415   MCHC 34.1 09/16/2018 1415   RDW 12.4 09/16/2018 1415   RDW 13.2 10/16/2014 1042   LYMPHSABS 2.6 05/29/2016 1423   MONOABS 1.0 05/29/2016 1423   EOSABS 0.0 05/29/2016 1423   BASOSABS 0.1 05/29/2016 1423    Hgb A1C No results found for: "HGBA1C"  Assessment & Plan:      Webb Silversmith, NP

## 2022-06-10 ENCOUNTER — Ambulatory Visit: Payer: Self-pay

## 2022-06-10 ENCOUNTER — Ambulatory Visit: Payer: Self-pay | Admitting: *Deleted

## 2022-06-10 NOTE — Telephone Encounter (Signed)
Pt hung up while waiting to speak with nurse.

## 2022-06-10 NOTE — Telephone Encounter (Signed)
Pt called back and call dropped again. He had began to speak with Verlon Au earlier. Unsure what the phone problem is but seems to be on his end.

## 2022-06-10 NOTE — Telephone Encounter (Addendum)
Pt. Disconnected the call. Called back, voice mailbox has not been set op. Unable to leave a message.  Pt. Reports "behavioral health doctor told me my BP is high." Does not remember the reading. States "I feel fine." Has stopped BP medications. Answer Assessment - Initial Assessment Questions 1. BLOOD PRESSURE: "What is the blood pressure?" "Did you take at least two measurements 5 minutes apart?"     175 ? 2. ONSET: "When did you take your blood pressure?"     N/a 3. HOW: "How did you take your blood pressure?" (e.g., automatic home BP monitor, visiting nurse)     Behavioral Health 4. HISTORY: "Do you have a history of high blood pressure?"     Yes 5. MEDICINES: "Are you taking any medicines for blood pressure?" "Have you missed any doses recently?"     Stopped medication 6. OTHER SYMPTOMS: "Do you have any symptoms?" (e.g., blurred vision, chest pain, difficulty breathing, headache, weakness)     I feel fine 7. PREGNANCY: "Is there any chance you are pregnant?" "When was your last menstrual period?"     N/a  Protocols used: Blood Pressure - High-A-AH

## 2022-06-10 NOTE — Telephone Encounter (Signed)
I called back but the only number for him is disconnected.

## 2022-08-30 ENCOUNTER — Ambulatory Visit (INDEPENDENT_AMBULATORY_CARE_PROVIDER_SITE_OTHER): Payer: Medicaid Other | Admitting: Internal Medicine

## 2022-08-30 ENCOUNTER — Encounter: Payer: Self-pay | Admitting: Internal Medicine

## 2022-08-30 VITALS — BP 91/52 | HR 48 | Temp 96.9°F | Wt 196.0 lb

## 2022-08-30 DIAGNOSIS — Z6828 Body mass index (BMI) 28.0-28.9, adult: Secondary | ICD-10-CM

## 2022-08-30 DIAGNOSIS — Z23 Encounter for immunization: Secondary | ICD-10-CM | POA: Diagnosis not present

## 2022-08-30 DIAGNOSIS — E663 Overweight: Secondary | ICD-10-CM

## 2022-08-30 DIAGNOSIS — I10 Essential (primary) hypertension: Secondary | ICD-10-CM

## 2022-08-30 DIAGNOSIS — F319 Bipolar disorder, unspecified: Secondary | ICD-10-CM

## 2022-08-30 MED ORDER — SULFAMETHOXAZOLE-TRIMETHOPRIM 800-160 MG PO TABS: 1.0000 | ORAL_TABLET | Freq: Two times a day (BID) | ORAL | 0 refills | Status: AC

## 2022-08-30 NOTE — Patient Instructions (Signed)
Skin Abscess  A skin abscess is an infected area on or under your skin that contains a collection of pus and other material. An abscess may also be called a furuncle, carbuncle, or boil. An abscess can occur in or on almost any part of your body. Some abscesses break open (rupture) on their own. Most continue to get worse unless they are treated. The infection can spread deeper into the body and eventually into your blood, which can make you feel ill. Treatment usually involves draining the abscess. What are the causes? An abscess occurs when germs, like bacteria, pass through your skin and cause an infection. This may be caused by: A scrape or cut on your skin. A puncture wound through your skin, including a needle injection or insect bite. Blocked oil or sweat glands. Blocked and infected hair follicles. A cyst that forms beneath your skin (sebaceous cyst) and becomes infected. What increases the risk? This condition is more likely to develop in people who: Have a weak body defense system (immune system). Have diabetes. Have dry and irritated skin. Get frequent injections or use illegal IV drugs. Have a foreign body in a wound, such as a splinter. Have problems with their lymph system or veins. What are the signs or symptoms? Symptoms of this condition include: A painful, firm bump under the skin. A bump with pus at the top. This may break through the skin and drain. Other symptoms include: Redness surrounding the abscess site. Warmth. Swelling of the lymph nodes (glands) near the abscess. Tenderness. A sore on the skin. How is this diagnosed? This condition may be diagnosed based on: A physical exam. Your medical history. A sample of pus. This may be used to find out what is causing the infection. Blood tests. Imaging tests, such as an ultrasound, CT scan, or MRI. How is this treated? A small abscess that drains on its own may not need treatment. Treatment for larger abscesses  may include: Moist heat or heat pack applied to the area several times a day. A procedure to drain the abscess (incision and drainage). Antibiotic medicines. For a severe abscess, you may first get antibiotics through an IV and then change to antibiotics by mouth. Follow these instructions at home: Medicines  Take over-the-counter and prescription medicines only as told by your health care provider. If you were prescribed an antibiotic medicine, take it as told by your health care provider. Do not stop taking the antibiotic even if you start to feel better. Abscess care  If you have an abscess that has not drained, apply heat to the affected area. Use the heat source that your health care provider recommends, such as a moist heat pack or a heating pad. Place a towel between your skin and the heat source. Leave the heat on for 20-30 minutes. Remove the heat if your skin turns bright red. This is especially important if you are unable to feel pain, heat, or cold. You may have a greater risk of getting burned. Follow instructions from your health care provider about how to take care of your abscess. Make sure you: Cover the abscess with a bandage (dressing). Change your dressing or gauze as told by your health care provider. Wash your hands with soap and water before you change the dressing or gauze. If soap and water are not available, use hand sanitizer. Check your abscess every day for signs of a worsening infection. Check for: More redness, swelling, or pain. More fluid or blood. Warmth.   More pus or a bad smell. General instructions To avoid spreading the infection: Do not share personal care items, towels, or hot tubs with others. Avoid making skin contact with other people. Keep all follow-up visits as told by your health care provider. This is important. Contact a health care provider if you have: More redness, swelling, or pain around your abscess. More fluid or blood coming from  your abscess. Warm skin around your abscess. More pus or a bad smell coming from your abscess. Muscle aches. Chills or a general ill feeling. Get help right away if you: Have severe pain. See red streaks on your skin spreading away from the abscess. See redness that spreads quickly. Have a fever or chills. Summary A skin abscess is an infected area on or under your skin that contains a collection of pus and other material. A small abscess that drains on its own may not need treatment. Treatment for larger abscesses may include having a procedure to drain the abscess and taking an antibiotic. This information is not intended to replace advice given to you by your health care provider. Make sure you discuss any questions you have with your health care provider. Document Revised: 12/23/2021 Document Reviewed: 06/28/2021 Else[employer] Patient Education  2023 [employer]  

## 2022-08-30 NOTE — Assessment & Plan Note (Signed)
Continue lithium and clonazepam per psychiatry

## 2022-08-30 NOTE — Progress Notes (Signed)
Subjective:    Patient ID: Ernest Romero, male    DOB: 09-01-1966, 56 y.o.   MRN: [id number]  HPI  Patient presents to clinic today for follow-up of chronic conditions.  HTN: His BP today is 85/56.  He is not taking Amlodipine, he states he ran out months ago.  He has been using his roommates BP medicine but does not remember the name of it.  ECG from 11/2016 reviewed.  Bipolar Disorder: Managed with Clonazepam, Lithium.  He is currently seeing a therapist and a psychiatrist.  He denies anxiety, SI/HI.  He also reports a cyst of his neck.  He noticed this 1 week ago.  He reports the cyst has drained white pus.  He reports surrounding redness, warmth and tenderness.  He denies fever, chills, nausea or vomiting.  He has not tried anything OTC for this.  Review of Systems  Past Medical History:  Diagnosis Date   Anxiety    Bipolar 1 disorder (HCC)    Depression    Hepatitis    Hypertension     Current Outpatient Medications  Medication Sig Dispense Refill   amLODipine (NORVASC) 10 MG tablet Take 1 tablet by mouth once daily 90 tablet 0   Cariprazine HCl (VRAYLAR) 4.5 MG CAPS Take 6 mg by mouth daily.      clonazePAM (KLONOPIN) 1 MG tablet Take 1 mg by mouth 2 (two) times daily.  2   lithium carbonate (ESKALITH) 450 MG CR tablet Take 1 tablet (450 mg total) by mouth at bedtime. 30 tablet 0   mupirocin ointment (BACTROBAN) 2 % Apply 1 application topically daily. Qd to excision site on neck 22 g 0   risperiDONE (RISPERDAL) 3 MG tablet Take 3 mg by mouth 2 (two) times daily.     Sofosbuvir-Velpatasvir (EPCLUSA) 400-100 MG TABS Take 1 tablet by mouth daily. 28 tablet 2   SUBOXONE 8-2 MG FILM Place 2.5 strips under the tongue daily.   1   No current facility-administered medications for this visit.    No Known Allergies  No family history on file.  Social History   Socioeconomic History   Marital status: Widowed    Spouse name: Not on file   Number of children: Not on file    Years of education: Not on file   Highest education level: Not on file  Occupational History   Not on file  Tobacco Use   Smoking status: Former   Smokeless tobacco: Never  Vaping Use   Vaping Use: Every day   Substances: Nicotine, Flavoring  Substance and Sexual Activity   Alcohol use: Not Currently    Comment: unknown amount or last drink   Drug use: Not Currently    Comment: unknown   Sexual activity: Never  Other Topics Concern   Not on file  Social History Narrative   Not on file   Social Determinants of Health   Financial Resource Strain: Not on file  Food Insecurity: Not on file  Transportation Needs: Not on file  Physical Activity: Not on file  Stress: Not on file  Social Connections: Not on file  Intimate Partner Violence: Not on file     Constitutional: Denies fever, malaise, fatigue, headache or abrupt weight changes.  Respiratory: Denies difficulty breathing, shortness of breath, cough or sputum production.   Cardiovascular: Denies chest pain, chest tightness, palpitations or swelling in the hands or feet.  Skin: Patient reports cyst of neck.  Denies redness, rashes, or ulcercations.  Neurological: Denies dizziness, difficulty with memory, difficulty with speech or problems with balance and coordination.  Psych: Patient has a history of depression.  Denies anxiety, SI/HI.  No other specific complaints in a complete review of systems (except as listed in HPI above).     Objective:   Physical Exam  BP (!) 91/52 (BP Location: Left Arm, Patient Position: Sitting, Cuff Size: Normal)   Pulse (!) 48   Temp (!) 96.9 F (36.1 C) (Temporal)   Wt 196 lb (88.9 kg)   SpO2 98%   BMI 28.12 kg/m   Wt Readings from Last 3 Encounters:  12/09/20 223 lb 6.4 oz (101.3 kg)  10/27/20 226 lb 3.2 oz (102.6 kg)  07/21/20 219 lb 3.2 oz (99.4 kg)    General: Appears his stated age, overweight, in NAD. Skin: 2 cm abscess noted of left side of neck.  There is an open area  that produces white pus.  There is surrounding redness but no evidence of cellulitis. HEENT: Head: normal shape and size; Eyes: sclera white, no icterus, conjunctiva pink, PERRLA and EOMs intact;  Cardiovascular: Normal rate and rhythm. S1,S2 noted.  No murmur, rubs or gallops noted. No JVD or BLE edema. Pulmonary/Chest: Normal effort and positive vesicular breath sounds. No respiratory distress. No wheezes, rales or ronchi noted.  Musculoskeletal:  No difficulty with gait.  Neurological: Alert and oriented.  Psychiatric: Mood and affect normal. Behavior is normal. Judgment and thought content normal.    BMET    Component Value Date/Time   NA 141 09/16/2018 1415   NA 138 10/16/2014 1042   K 3.2 (L) 09/16/2018 1415   K 4.2 10/16/2014 1042   CL 107 09/16/2018 1415   CL 105 10/16/2014 1042   CO2 28 09/16/2018 1415   CO2 27 10/16/2014 1042   GLUCOSE 129 (H) 09/16/2018 1415   GLUCOSE 72 10/16/2014 1042   BUN 11 09/16/2018 1415   BUN 14 10/16/2014 1042   CREATININE 1.04 09/16/2018 1415   CREATININE 1.39 (H) 10/16/2014 1042   CALCIUM 9.0 09/16/2018 1415   CALCIUM 9.6 10/16/2014 1042   GFRNONAA >60 09/16/2018 1415   GFRNONAA 57 (L) 10/16/2014 1042   GFRNONAA >60 03/09/2014 1233   GFRAA >60 09/16/2018 1415   GFRAA >60 10/16/2014 1042   GFRAA >60 03/09/2014 1233    Lipid Panel  No results found for: "CHOL", "TRIG", "HDL", "CHOLHDL", "VLDL", "LDLCALC"  CBC    Component Value Date/Time   WBC 6.9 09/16/2018 1415   RBC 5.23 09/16/2018 1415   HGB 15.7 09/16/2018 1415   HGB 16.0 10/16/2014 1042   HCT 46.0 09/16/2018 1415   HCT 48.5 10/16/2014 1042   PLT 251 09/16/2018 1415   PLT 222 10/16/2014 1042   MCV 88.0 09/16/2018 1415   MCV 91 10/16/2014 1042   MCH 30.0 09/16/2018 1415   MCHC 34.1 09/16/2018 1415   RDW 12.4 09/16/2018 1415   RDW 13.2 10/16/2014 1042   LYMPHSABS 2.6 05/29/2016 1423   MONOABS 1.0 05/29/2016 1423   EOSABS 0.0 05/29/2016 1423   BASOSABS 0.1 05/29/2016  1423    Hgb A1C No results found for: "HGBA1C"         Assessment & Plan:    Abscess, Left Side of Neck:  Rx for Septra DS 1 tab p.o. twice daily x10 days Encourage warm compresses 2 times daily as needed  RTC in 6 months for your annual exam Nicki Reaper, NP

## 2022-08-30 NOTE — Assessment & Plan Note (Signed)
Encourage diet and exercise for weight loss 

## 2022-08-30 NOTE — Assessment & Plan Note (Signed)
Low today Advised him to stop taking his roommates blood pressure medication

## 2022-09-21 ENCOUNTER — Ambulatory Visit: Payer: Medicaid Other | Admitting: Internal Medicine

## 2022-09-21 ENCOUNTER — Encounter: Payer: Self-pay | Admitting: Internal Medicine

## 2022-09-21 NOTE — Progress Notes (Deleted)
Subjective:    Patient ID: Ernest Romero, male    DOB: 01-01-1966, 56 y.o.   MRN: [id number]  HPI  Patient presents to clinic today for 2-week follow-up with HTN however at his last visit his BP was 91/52.  He reported he had been taking his roommates blood pressure medication but did not know the name of the dose.  His BP today is.  ECG from 11/2016 reviewed.  Review of Systems     Past Medical History:  Diagnosis Date   Anxiety    Bipolar 1 disorder (HCC)    Depression    Hepatitis    Hypertension     Current Outpatient Medications  Medication Sig Dispense Refill   clonazePAM (KLONOPIN) 1 MG tablet Take 1 mg by mouth 2 (two) times daily.  2   lithium carbonate (ESKALITH) 450 MG CR tablet Take 1 tablet (450 mg total) by mouth at bedtime. 30 tablet 0   SUBOXONE 8-2 MG FILM Place 2.5 strips under the tongue daily.   1   No current facility-administered medications for this visit.    No Known Allergies  No family history on file.  Social History   Socioeconomic History   Marital status: Widowed    Spouse name: Not on file   Number of children: Not on file   Years of education: Not on file   Highest education level: Not on file  Occupational History   Not on file  Tobacco Use   Smoking status: Former   Smokeless tobacco: Never  Vaping Use   Vaping Use: Every day   Substances: Nicotine, Flavoring  Substance and Sexual Activity   Alcohol use: Not Currently    Comment: unknown amount or last drink   Drug use: Not Currently    Comment: unknown   Sexual activity: Never  Other Topics Concern   Not on file  Social History Narrative   Not on file   Social Determinants of Health   Financial Resource Strain: Not on file  Food Insecurity: Not on file  Transportation Needs: Not on file  Physical Activity: Not on file  Stress: Not on file  Social Connections: Not on file  Intimate Partner Violence: Not on file     Constitutional: Denies fever, malaise,  fatigue, headache or abrupt weight changes.  HEENT: Denies eye pain, eye redness, ear pain, ringing in the ears, wax buildup, runny nose, nasal congestion, bloody nose, or sore throat. Respiratory: Denies difficulty breathing, shortness of breath, cough or sputum production.   Cardiovascular: Denies chest pain, chest tightness, palpitations or swelling in the hands or feet.  Gastrointestinal: Denies abdominal pain, bloating, constipation, diarrhea or blood in the stool.  GU: Denies urgency, frequency, pain with urination, burning sensation, blood in urine, odor or discharge. Musculoskeletal: Denies decrease in range of motion, difficulty with gait, muscle pain or joint pain and swelling.  Skin: Denies redness, rashes, lesions or ulcercations.  Neurological: Denies dizziness, difficulty with memory, difficulty with speech or problems with balance and coordination.  Psych: Denies anxiety, depression, SI/HI.  No other specific complaints in a complete review of systems (except as listed in HPI above).  Objective:   Physical Exam   There were no vitals taken for this visit. Wt Readings from Last 3 Encounters:  08/30/22 196 lb (88.9 kg)  12/09/20 223 lb 6.4 oz (101.3 kg)  10/27/20 226 lb 3.2 oz (102.6 kg)    General: Appears their stated age, well developed, well nourished in NAD.  Skin: Warm, dry and intact. No rashes, lesions or ulcerations noted. HEENT: Head: normal shape and size; Eyes: sclera white, no icterus, conjunctiva pink, PERRLA and EOMs intact; Ears: Tm's gray and intact, normal light reflex; Nose: mucosa pink and moist, septum midline; Throat/Mouth: Teeth present, mucosa pink and moist, no exudate, lesions or ulcerations noted.  Neck:  Neck supple, trachea midline. No masses, lumps or thyromegaly present.  Cardiovascular: Normal rate and rhythm. S1,S2 noted.  No murmur, rubs or gallops noted. No JVD or BLE edema. No carotid bruits noted. Pulmonary/Chest: Normal effort and  positive vesicular breath sounds. No respiratory distress. No wheezes, rales or ronchi noted.  Abdomen: Soft and nontender. Normal bowel sounds. No distention or masses noted. Liver, spleen and kidneys non palpable. Musculoskeletal: Normal range of motion. No signs of joint swelling. No difficulty with gait.  Neurological: Alert and oriented. Cranial nerves II-XII grossly intact. Coordination normal.  Psychiatric: Mood and affect normal. Behavior is normal. Judgment and thought content normal.     BMET    Component Value Date/Time   NA 141 09/16/2018 1415   NA 138 10/16/2014 1042   K 3.2 (L) 09/16/2018 1415   K 4.2 10/16/2014 1042   CL 107 09/16/2018 1415   CL 105 10/16/2014 1042   CO2 28 09/16/2018 1415   CO2 27 10/16/2014 1042   GLUCOSE 129 (H) 09/16/2018 1415   GLUCOSE 72 10/16/2014 1042   BUN 11 09/16/2018 1415   BUN 14 10/16/2014 1042   CREATININE 1.04 09/16/2018 1415   CREATININE 1.39 (H) 10/16/2014 1042   CALCIUM 9.0 09/16/2018 1415   CALCIUM 9.6 10/16/2014 1042   GFRNONAA >60 09/16/2018 1415   GFRNONAA 57 (L) 10/16/2014 1042   GFRNONAA >60 03/09/2014 1233   GFRAA >60 09/16/2018 1415   GFRAA >60 10/16/2014 1042   GFRAA >60 03/09/2014 1233    Lipid Panel  No results found for: "CHOL", "TRIG", "HDL", "CHOLHDL", "VLDL", "LDLCALC"  CBC    Component Value Date/Time   WBC 6.9 09/16/2018 1415   RBC 5.23 09/16/2018 1415   HGB 15.7 09/16/2018 1415   HGB 16.0 10/16/2014 1042   HCT 46.0 09/16/2018 1415   HCT 48.5 10/16/2014 1042   PLT 251 09/16/2018 1415   PLT 222 10/16/2014 1042   MCV 88.0 09/16/2018 1415   MCV 91 10/16/2014 1042   MCH 30.0 09/16/2018 1415   MCHC 34.1 09/16/2018 1415   RDW 12.4 09/16/2018 1415   RDW 13.2 10/16/2014 1042   LYMPHSABS 2.6 05/29/2016 1423   MONOABS 1.0 05/29/2016 1423   EOSABS 0.0 05/29/2016 1423   BASOSABS 0.1 05/29/2016 1423    Hgb A1C No results found for: "HGBA1C"         Assessment & Plan:     RTC in 6 months for  your annual exam Nicki Reaper, NP

## 2023-04-15 ENCOUNTER — Other Ambulatory Visit: Payer: Self-pay

## 2023-04-15 ENCOUNTER — Emergency Department
Admission: EM | Admit: 2023-04-15 | Discharge: 2023-04-16 | Disposition: A | Discharge status: Home / Self Care | Payer: 59 | Attending: Emergency Medicine | Admitting: Emergency Medicine

## 2023-04-15 DIAGNOSIS — Z79899 Other long term (current) drug therapy: Secondary | ICD-10-CM | POA: Diagnosis not present

## 2023-04-15 DIAGNOSIS — F29 Unspecified psychosis not due to a substance or known physiological condition: Secondary | ICD-10-CM | POA: Diagnosis not present

## 2023-04-15 DIAGNOSIS — F14150 Cocaine abuse with cocaine-induced psychotic disorder with delusions: Secondary | ICD-10-CM | POA: Diagnosis not present

## 2023-04-15 DIAGNOSIS — F319 Bipolar disorder, unspecified: Secondary | ICD-10-CM | POA: Diagnosis not present

## 2023-04-15 DIAGNOSIS — R462 Strange and inexplicable behavior: Secondary | ICD-10-CM | POA: Diagnosis present

## 2023-04-15 DIAGNOSIS — F22 Delusional disorders: Secondary | ICD-10-CM | POA: Diagnosis present

## 2023-04-15 DIAGNOSIS — F1510 Other stimulant abuse, uncomplicated: Secondary | ICD-10-CM | POA: Diagnosis present

## 2023-04-15 LAB — URINE DRUG SCREEN, QUALITATIVE (ARMC ONLY)
Amphetamines, Ur Screen: POSITIVE — AB
Barbiturates, Ur Screen: NOT DETECTED
Benzodiazepine, Ur Scrn: NOT DETECTED
Cannabinoid 50 Ng, Ur ~~LOC~~: POSITIVE — AB
Cocaine Metabolite,Ur ~~LOC~~: POSITIVE — AB
MDMA (Ecstasy)Ur Screen: NOT DETECTED
Methadone Scn, Ur: NOT DETECTED
Opiate, Ur Screen: NOT DETECTED
Phencyclidine (PCP) Ur S: NOT DETECTED
Tricyclic, Ur Screen: NOT DETECTED

## 2023-04-15 LAB — COMPREHENSIVE METABOLIC PANEL
ALT: 19 U/L (ref 0–44)
AST: 27 U/L (ref 15–41)
Albumin: 4.5 g/dL (ref 3.5–5.0)
Alkaline Phosphatase: 92 U/L (ref 38–126)
Anion gap: 7 (ref 5–15)
BUN: 19 mg/dL (ref 6–20)
CO2: 22 mmol/L (ref 22–32)
Calcium: 9.5 mg/dL (ref 8.9–10.3)
Chloride: 108 mmol/L (ref 98–111)
Creatinine, Ser: 1.09 mg/dL (ref 0.61–1.24)
GFR, Estimated: >60 mL/min (ref >60)
Glucose, Bld: 118 mg/dL — ABNORMAL HIGH (ref 70–99)
Potassium: 4.3 mmol/L (ref 3.5–5.1)
Sodium: 137 mmol/L (ref 135–145)
Total Bilirubin: 1 mg/dL (ref 0.3–1.2)
Total Protein: 7.6 g/dL (ref 6.5–8.1)

## 2023-04-15 LAB — CBC
HCT: 43.9 % (ref 39.0–52.0)
Hemoglobin: 14.8 g/dL (ref 13.0–17.0)
MCH: 30.6 pg (ref 26.0–34.0)
MCHC: 33.7 g/dL (ref 30.0–36.0)
MCV: 90.7 fL (ref 80.0–100.0)
Platelets: 299 10*3/uL (ref 150–400)
RBC: 4.84 MIL/uL (ref 4.22–5.81)
RDW: 13.1 % (ref 11.5–15.5)
WBC: 8.8 10*3/uL (ref 4.0–10.5)
nRBC: 0 % (ref 0.0–0.2)

## 2023-04-15 LAB — LITHIUM LEVEL: Lithium Lvl: 0.36 mmol/L — ABNORMAL LOW (ref 0.60–1.20)

## 2023-04-15 LAB — SALICYLATE LEVEL: Salicylate Lvl: <7 mg/dL — ABNORMAL LOW (ref 7.0–30.0)

## 2023-04-15 LAB — ACETAMINOPHEN LEVEL: Acetaminophen (Tylenol), Serum: <10 ug/mL — ABNORMAL LOW (ref 10–30)

## 2023-04-15 LAB — ETHANOL: Alcohol, Ethyl (B): <10 mg/dL (ref <10)

## 2023-04-15 MED ORDER — LITHIUM CARBONATE ER 450 MG PO TBCR
450.0000 mg | EXTENDED_RELEASE_TABLET | Freq: Two times a day (BID) | ORAL | Status: DC
  Administered 2023-04-15 – 2023-04-16 (×3): 450 mg via ORAL
  Filled 2023-04-15 (×4): qty 1

## 2023-04-15 MED ORDER — BUPRENORPHINE HCL-NALOXONE HCL 8-2 MG SL SUBL
1.0000 | SUBLINGUAL_TABLET | Freq: Every day | SUBLINGUAL | Status: DC
  Administered 2023-04-15 – 2023-04-16 (×2): 1 via SUBLINGUAL
  Filled 2023-04-15 (×2): qty 1

## 2023-04-15 MED ORDER — LITHIUM CARBONATE ER 450 MG PO TBCR: 450.0000 mg | EXTENDED_RELEASE_TABLET | Freq: Every day | ORAL | Status: DC

## 2023-04-15 MED ORDER — CLONAZEPAM 0.5 MG PO TABS
0.5000 mg | ORAL_TABLET | Freq: Two times a day (BID) | ORAL | Status: DC
  Administered 2023-04-15 – 2023-04-16 (×3): 0.5 mg via ORAL
  Filled 2023-04-15 (×3): qty 1

## 2023-04-15 NOTE — ED Notes (Signed)
Pt sleeping, will obtain vital signs when pt wakes up.  

## 2023-04-15 NOTE — ED Notes (Signed)
Pt moved to B[facility] 3. No complaints at this time, tv turned on at this time, pt to go to sleep he states

## 2023-04-15 NOTE — ED Notes (Signed)
Pt screaming in room "I want to die" because medications are not ordered. Pt instructed provider is working on ordering medications and it takes time, pt remains screaming in room "fuck you, fuck you, fuck you"

## 2023-04-15 NOTE — ED Notes (Signed)
Pt volatile with drastic mood swings in triage. PD involvement required to ensure safety while attempting to complete triage protocols.
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
## 2023-04-15 NOTE — ED Notes (Signed)
ivc prior to arrival/pending consult/per NP Dixon pt is recommended for inpatient.

## 2023-04-15 NOTE — ED Notes (Signed)
This tech obtained vital signs on pt.  

## 2023-04-15 NOTE — ED Notes (Signed)
TTS speaking with patient. 

## 2023-04-15 NOTE — ED Notes (Signed)
Pt received dinner tray and drink.  
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
## 2023-04-15 NOTE — ED Notes (Signed)
Breakfast Tray given  

## 2023-04-15 NOTE — ED Notes (Signed)
Patient being dressed out into hospital scrubs at this time. All belongings logged and placed in hospital safe. This RN, Rosalie Doctor RN, and 3 PD officers as witness to change out process.
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
## 2023-04-15 NOTE — ED Notes (Signed)
IVC/pending psych consult 

## 2023-04-15 NOTE — ED Notes (Signed)
LEO remains with pt
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
## 2023-04-15 NOTE — ED Notes (Signed)
Patient is IVC pending inpatient admit 

## 2023-04-16 DIAGNOSIS — F14150 Cocaine abuse with cocaine-induced psychotic disorder with delusions: Secondary | ICD-10-CM | POA: Diagnosis not present

## 2023-04-16 DIAGNOSIS — F319 Bipolar disorder, unspecified: Secondary | ICD-10-CM | POA: Diagnosis not present

## 2023-04-16 NOTE — ED Provider Notes (Signed)
Emergency Medicine Observation Re-evaluation Note  Ernest Romero is a 57 y.o. male, seen on rounds today.  Pt initially presented to the ED for complaints of IVC and Psychiatric Evaluation   Physical Exam  BP (!) 158/93 (BP Location: Left Arm)   Pulse 60   Temp 97.6 F (36.4 C) (Oral)   Resp 18   Ht 5\' 10"  (1.778 m)   Wt 79.4 kg   SpO2 97%   BMI 25.11 kg/m  Physical Exam General: nad  ED Course / MDM  EKG:   I have reviewed the labs performed to date as well as medications administered while in observation.    Plan  Current plan is for psych/soc.    Willy Eddy, MD 04/16/23 1024
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
## 2023-04-16 NOTE — ED Notes (Signed)
Patient attempted to contact patient advocate for ride home.  Unable to contact his patient advocate.  Cab voucher given to patient.  Patient discharged to lobby to wait for cab.

## 2023-04-16 NOTE — ED Notes (Signed)
Pt up to restroom.

## 2023-04-16 NOTE — ED Notes (Signed)
ivc/pending consult/recommended for inpatient admission.Marland KitchenMarland Kitchen

## 2023-06-22 ENCOUNTER — Ambulatory Visit: Payer: 59 | Admitting: Podiatry

## 2023-06-27 ENCOUNTER — Ambulatory Visit: Payer: 59 | Admitting: Podiatry

## 2023-08-31 ENCOUNTER — Other Ambulatory Visit: Payer: Self-pay

## 2023-08-31 ENCOUNTER — Emergency Department
Admission: EM | Admit: 2023-08-31 | Discharge: 2023-09-01 | Disposition: A | Discharge status: Home / Self Care | Payer: 59 | Attending: Emergency Medicine | Admitting: Emergency Medicine

## 2023-08-31 DIAGNOSIS — S61411A Laceration without foreign body of right hand, initial encounter: Secondary | ICD-10-CM | POA: Diagnosis present

## 2023-08-31 DIAGNOSIS — W268XXA Contact with other sharp object(s), not elsewhere classified, initial encounter: Secondary | ICD-10-CM | POA: Insufficient documentation

## 2023-08-31 DIAGNOSIS — Z23 Encounter for immunization: Secondary | ICD-10-CM | POA: Diagnosis not present

## 2023-08-31 DIAGNOSIS — I10 Essential (primary) hypertension: Secondary | ICD-10-CM | POA: Diagnosis not present

## 2023-08-31 DIAGNOSIS — K047 Periapical abscess without sinus: Secondary | ICD-10-CM | POA: Diagnosis not present

## 2023-08-31 MED ORDER — LIDOCAINE HCL (PF) 1 % IJ SOLN
5.0000 mL | Freq: Once | INTRAMUSCULAR | Status: AC
  Administered 2023-08-31: 5 mL
  Filled 2023-08-31: qty 5

## 2023-08-31 MED ORDER — TETANUS-DIPHTH-ACELL PERTUSSIS 5-2.5-18.5 LF-MCG/0.5 IM SUSY
0.5000 mL | PREFILLED_SYRINGE | Freq: Once | INTRAMUSCULAR | Status: AC
  Administered 2023-08-31: 0.5 mL via INTRAMUSCULAR
  Filled 2023-08-31: qty 0.5

## 2023-08-31 MED ORDER — AMOXICILLIN-POT CLAVULANATE 875-125 MG PO TABS: 1.0000 | ORAL_TABLET | Freq: Two times a day (BID) | ORAL | 0 refills | Status: AC

## 2023-08-31 MED ORDER — AMOXICILLIN-POT CLAVULANATE 875-125 MG PO TABS
1.0000 | ORAL_TABLET | Freq: Once | ORAL | Status: AC
  Administered 2023-08-31: 1 via ORAL
  Filled 2023-08-31: qty 1

## 2023-08-31 NOTE — ED Triage Notes (Signed)
Pt arrives EMS from home with reports of laceration to right palm of hand. Pt reports cutting it on a plate just prior to arrival.
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
## 2023-08-31 NOTE — Discharge Instructions (Signed)
Your wound repair required internal stitches to help tie off and actively bleeding vessel.  Keep the wound clean, dry, and covered.  See your primary provider for suture removal in 7 to 10 days.  Take the prescription antibiotic twice daily as directed for your dental abscess.  Follow-up with the local dental clinic for definitive management.

## 2024-02-25 ENCOUNTER — Emergency Department: Payer: MEDICAID

## 2024-02-25 ENCOUNTER — Encounter: Payer: Self-pay | Admitting: Emergency Medicine

## 2024-02-25 ENCOUNTER — Emergency Department
Admission: EM | Admit: 2024-02-25 | Discharge: 2024-02-25 | Disposition: A | Discharge status: Home / Self Care | Payer: MEDICAID | Attending: Emergency Medicine | Admitting: Emergency Medicine

## 2024-02-25 ENCOUNTER — Other Ambulatory Visit: Payer: Self-pay

## 2024-02-25 DIAGNOSIS — S0285XA Fracture of orbit, unspecified, initial encounter for closed fracture: Secondary | ICD-10-CM

## 2024-02-25 DIAGNOSIS — I10 Essential (primary) hypertension: Secondary | ICD-10-CM | POA: Insufficient documentation

## 2024-02-25 DIAGNOSIS — S0240DA Maxillary fracture, left side, initial encounter for closed fracture: Secondary | ICD-10-CM | POA: Diagnosis not present

## 2024-02-25 DIAGNOSIS — S0592XA Unspecified injury of left eye and orbit, initial encounter: Secondary | ICD-10-CM | POA: Diagnosis present

## 2024-02-25 MED ORDER — LORAZEPAM 1 MG PO TABS
1.0000 mg | ORAL_TABLET | Freq: Once | ORAL | Status: AC
  Administered 2024-02-25: 1 mg via ORAL
  Filled 2024-02-25: qty 1

## 2024-02-25 MED ORDER — AMOXICILLIN-POT CLAVULANATE 875-125 MG PO TABS: 1.0000 | ORAL_TABLET | Freq: Two times a day (BID) | ORAL | 0 refills | Status: DC

## 2024-02-25 MED ORDER — CLONAZEPAM 1 MG PO TABS: 1.0000 mg | ORAL_TABLET | Freq: Two times a day (BID) | ORAL | 0 refills | Status: AC
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
## 2024-02-27 ENCOUNTER — Emergency Department: Payer: MEDICAID

## 2024-02-27 ENCOUNTER — Other Ambulatory Visit: Payer: Self-pay

## 2024-02-27 ENCOUNTER — Emergency Department
Admission: EM | Admit: 2024-02-27 | Discharge: 2024-02-27 | Disposition: A | Discharge status: Home / Self Care | Payer: MEDICAID | Attending: Emergency Medicine | Admitting: Emergency Medicine

## 2024-02-27 ENCOUNTER — Encounter: Payer: Self-pay | Admitting: Emergency Medicine

## 2024-02-27 ENCOUNTER — Telehealth: Payer: Self-pay | Admitting: Emergency Medicine

## 2024-02-27 DIAGNOSIS — R0781 Pleurodynia: Secondary | ICD-10-CM | POA: Diagnosis present

## 2024-02-27 MED ORDER — ACETAMINOPHEN 500 MG PO TABS
1000.0000 mg | ORAL_TABLET | Freq: Once | ORAL | Status: AC
  Administered 2024-02-27: 1000 mg via ORAL
  Filled 2024-02-27: qty 2

## 2024-02-27 MED ORDER — OXYCODONE HCL 5 MG PO TABS
5.0000 mg | ORAL_TABLET | Freq: Once | ORAL | Status: AC
  Administered 2024-02-27: 5 mg via ORAL
  Filled 2024-02-27: qty 1

## 2024-02-27 MED ORDER — AMOXICILLIN-POT CLAVULANATE 875-125 MG PO TABS: 1.0000 | ORAL_TABLET | Freq: Two times a day (BID) | ORAL | 0 refills | Status: AC

## 2024-02-27 MED ORDER — CLONAZEPAM 0.5 MG PO TABS
1.0000 mg | ORAL_TABLET | Freq: Once | ORAL | Status: AC
  Administered 2024-02-27: 1 mg via ORAL
  Filled 2024-02-27: qty 2
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
## 2024-02-27 NOTE — ED Notes (Signed)
Box meal given.

## 2024-02-27 NOTE — Telephone Encounter (Signed)
-----------------------------------------   1:58 PM on 02/27/24 ----------------------------------------- Patient requesting that prescription be changed from [facility] to [facility].  Augmentin  was changed as requested, however patient informed that we will not be able to change pharmacy for Klonopin  as this is a controlled substance.
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
## 2024-03-01 ENCOUNTER — Other Ambulatory Visit: Payer: Self-pay

## 2024-03-01 DIAGNOSIS — Z760 Encounter for issue of repeat prescription: Secondary | ICD-10-CM | POA: Insufficient documentation

## 2024-03-01 DIAGNOSIS — S0232XA Fracture of orbital floor, left side, initial encounter for closed fracture: Secondary | ICD-10-CM | POA: Diagnosis not present

## 2024-03-01 DIAGNOSIS — F1910 Other psychoactive substance abuse, uncomplicated: Secondary | ICD-10-CM | POA: Diagnosis not present

## 2024-03-01 DIAGNOSIS — W1830XA Fall on same level, unspecified, initial encounter: Secondary | ICD-10-CM | POA: Insufficient documentation

## 2024-03-01 DIAGNOSIS — I10 Essential (primary) hypertension: Secondary | ICD-10-CM | POA: Insufficient documentation

## 2024-03-01 DIAGNOSIS — Z7289 Other problems related to lifestyle: Secondary | ICD-10-CM | POA: Insufficient documentation

## 2024-03-01 DIAGNOSIS — F309 Manic episode, unspecified: Secondary | ICD-10-CM | POA: Insufficient documentation

## 2024-03-01 DIAGNOSIS — L03113 Cellulitis of right upper limb: Secondary | ICD-10-CM | POA: Diagnosis not present

## 2024-03-01 DIAGNOSIS — S0993XA Unspecified injury of face, initial encounter: Secondary | ICD-10-CM | POA: Diagnosis present

## 2024-03-01 DIAGNOSIS — F29 Unspecified psychosis not due to a substance or known physiological condition: Secondary | ICD-10-CM | POA: Diagnosis not present

## 2024-03-01 DIAGNOSIS — Z5900 Homelessness unspecified: Secondary | ICD-10-CM | POA: Insufficient documentation
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
## 2024-03-02 ENCOUNTER — Emergency Department
Admission: EM | Admit: 2024-03-02 | Discharge: 2024-03-03 | Disposition: A | Discharge status: Other Acute Inpatient Hospital | Payer: MEDICAID | Attending: Emergency Medicine | Admitting: Emergency Medicine

## 2024-03-02 DIAGNOSIS — F29 Unspecified psychosis not due to a substance or known physiological condition: Secondary | ICD-10-CM

## 2024-03-02 DIAGNOSIS — F1910 Other psychoactive substance abuse, uncomplicated: Secondary | ICD-10-CM | POA: Insufficient documentation

## 2024-03-02 DIAGNOSIS — Z760 Encounter for issue of repeat prescription: Secondary | ICD-10-CM

## 2024-03-02 DIAGNOSIS — Z765 Malingerer [conscious simulation]: Secondary | ICD-10-CM

## 2024-03-02 DIAGNOSIS — L02419 Cutaneous abscess of limb, unspecified: Secondary | ICD-10-CM

## 2024-03-02 DIAGNOSIS — S0285XD Fracture of orbit, unspecified, subsequent encounter for fracture with routine healing: Secondary | ICD-10-CM

## 2024-03-02 DIAGNOSIS — F3010 Manic episode without psychotic symptoms, unspecified: Secondary | ICD-10-CM

## 2024-03-02 DIAGNOSIS — Z5900 Homelessness unspecified: Secondary | ICD-10-CM

## 2024-03-02 LAB — COMPREHENSIVE METABOLIC PANEL WITH GFR
ALT: 38 U/L (ref 0–44)
AST: 47 U/L — ABNORMAL HIGH (ref 15–41)
Albumin: 3.8 g/dL (ref 3.5–5.0)
Alkaline Phosphatase: 80 U/L (ref 38–126)
Anion gap: 5 (ref 5–15)
BUN: 23 mg/dL — ABNORMAL HIGH (ref 6–20)
CO2: 29 mmol/L (ref 22–32)
Calcium: 9 mg/dL (ref 8.9–10.3)
Chloride: 104 mmol/L (ref 98–111)
Creatinine, Ser: 1.05 mg/dL (ref 0.61–1.24)
GFR, Estimated: >60 mL/min (ref >60)
Glucose, Bld: 99 mg/dL (ref 70–99)
Potassium: 4.2 mmol/L (ref 3.5–5.1)
Sodium: 138 mmol/L (ref 135–145)
Total Bilirubin: 0.8 mg/dL (ref 0.0–1.2)
Total Protein: 6.9 g/dL (ref 6.5–8.1)

## 2024-03-02 LAB — CBC WITH DIFFERENTIAL/PLATELET
Abs Immature Granulocytes: 0.04 10*3/uL (ref 0.00–0.07)
Basophils Absolute: 0 10*3/uL (ref 0.0–0.1)
Basophils Relative: 1 %
Eosinophils Absolute: 0.4 10*3/uL (ref 0.0–0.5)
Eosinophils Relative: 5 %
HCT: 41.6 % (ref 39.0–52.0)
Hemoglobin: 13.9 g/dL (ref 13.0–17.0)
Immature Granulocytes: 1 %
Lymphocytes Relative: 29 %
Lymphs Abs: 2.3 10*3/uL (ref 0.7–4.0)
MCH: 29.1 pg (ref 26.0–34.0)
MCHC: 33.4 g/dL (ref 30.0–36.0)
MCV: 87.2 fL (ref 80.0–100.0)
Monocytes Absolute: 0.8 10*3/uL (ref 0.1–1.0)
Monocytes Relative: 10 %
Neutro Abs: 4.4 10*3/uL (ref 1.7–7.7)
Neutrophils Relative %: 54 %
Platelets: 238 10*3/uL (ref 150–400)
RBC: 4.77 MIL/uL (ref 4.22–5.81)
RDW: 13.4 % (ref 11.5–15.5)
WBC: 8 10*3/uL (ref 4.0–10.5)
nRBC: 0 % (ref 0.0–0.2)

## 2024-03-02 LAB — URINE DRUG SCREEN, QUALITATIVE (ARMC ONLY)
Amphetamines, Ur Screen: POSITIVE — AB
Barbiturates, Ur Screen: NOT DETECTED
Benzodiazepine, Ur Scrn: NOT DETECTED
Cannabinoid 50 Ng, Ur ~~LOC~~: POSITIVE — AB
Cocaine Metabolite,Ur ~~LOC~~: NOT DETECTED
MDMA (Ecstasy)Ur Screen: NOT DETECTED
Methadone Scn, Ur: NOT DETECTED
Opiate, Ur Screen: NOT DETECTED
Phencyclidine (PCP) Ur S: NOT DETECTED
Tricyclic, Ur Screen: NOT DETECTED

## 2024-03-02 LAB — LITHIUM LEVEL: Lithium Lvl: <0.06 mmol/L — ABNORMAL LOW (ref 0.60–1.20)

## 2024-03-02 LAB — ETHANOL: Alcohol, Ethyl (B): <15 mg/dL (ref <15)

## 2024-03-02 MED ORDER — ACETAMINOPHEN 500 MG PO TABS
1000.0000 mg | ORAL_TABLET | Freq: Once | ORAL | Status: AC
  Administered 2024-03-02: 1000 mg via ORAL
  Filled 2024-03-02: qty 2

## 2024-03-02 MED ORDER — LURASIDONE HCL 40 MG PO TABS
20.0000 mg | ORAL_TABLET | Freq: Every day | ORAL | Status: DC
  Administered 2024-03-03: 20 mg via ORAL
  Filled 2024-03-02: qty 1

## 2024-03-02 MED ORDER — ZIPRASIDONE MESYLATE 20 MG IM SOLR
20.0000 mg | Freq: Once | INTRAMUSCULAR | Status: AC
  Administered 2024-03-02: 20 mg via INTRAMUSCULAR
  Filled 2024-03-02: qty 20

## 2024-03-02 MED ORDER — OXYCODONE HCL 5 MG PO TABS
5.0000 mg | ORAL_TABLET | Freq: Once | ORAL | Status: AC
  Administered 2024-03-02: 5 mg via ORAL
  Filled 2024-03-02: qty 1

## 2024-03-02 MED ORDER — LITHIUM CARBONATE ER 450 MG PO TBCR
450.0000 mg | EXTENDED_RELEASE_TABLET | Freq: Every day | ORAL | Status: DC
  Administered 2024-03-02: 450 mg via ORAL
  Filled 2024-03-02: qty 1

## 2024-03-02 MED ORDER — HYDROXYZINE HCL 25 MG PO TABS
25.0000 mg | ORAL_TABLET | Freq: Once | ORAL | Status: AC
  Administered 2024-03-02: 25 mg via ORAL
  Filled 2024-03-02: qty 1

## 2024-03-02 MED ORDER — CEPHALEXIN 500 MG PO CAPS
500.0000 mg | ORAL_CAPSULE | Freq: Four times a day (QID) | ORAL | Status: DC
  Administered 2024-03-02 – 2024-03-03 (×6): 500 mg via ORAL
  Filled 2024-03-02 (×6): qty 1

## 2024-03-02 MED ORDER — LIDOCAINE HCL (PF) 1 % IJ SOLN
5.0000 mL | Freq: Once | INTRAMUSCULAR | Status: AC
  Administered 2024-03-02: 5 mL via INTRADERMAL
  Filled 2024-03-02: qty 5

## 2024-03-02 MED ORDER — IBUPROFEN 800 MG PO TABS
800.0000 mg | ORAL_TABLET | Freq: Once | ORAL | Status: AC
  Administered 2024-03-02: 800 mg via ORAL
  Filled 2024-03-02: qty 1

## 2024-03-02 MED ORDER — CLONAZEPAM 0.5 MG PO TABS
1.0000 mg | ORAL_TABLET | Freq: Once | ORAL | Status: AC
  Administered 2024-03-02: 1 mg via ORAL
  Filled 2024-03-02: qty 2

## 2024-03-02 MED ORDER — LIDOCAINE HCL (PF) 1 % IJ SOLN
INTRAMUSCULAR | Status: AC
  Filled 2024-03-02: qty 5

## 2024-03-02 MED ORDER — DOXYCYCLINE HYCLATE 100 MG PO TABS
100.0000 mg | ORAL_TABLET | Freq: Two times a day (BID) | ORAL | Status: DC
  Administered 2024-03-02 – 2024-03-03 (×4): 100 mg via ORAL
  Filled 2024-03-02 (×4): qty 1

## 2024-03-02 NOTE — ED Notes (Signed)
 Patient accepted to [facility] 03/03/24

## 2024-03-02 NOTE — ED Notes (Signed)
 Black bracelet and black watch placed in pt belonging bag with other belongings.

## 2024-03-02 NOTE — ED Notes (Signed)
 Ivc/ psych consult pending

## 2024-03-02 NOTE — BH Assessment (Signed)
 Per [facility] M.,), patient to be referred out of system.  Referral information for Psychiatric Hospitalization faxed to;  Service Provider Phone  [facility] ---- no available bed                                         [phone]  [facility] [phone]  [facility] [phone]  [facility] [phone]  [facility] [phone]  [facility] [phone]  [facility] [phone]  [facility] [phone]  [facility] [phone]  [facility] [phone]  [facility] [phone]  [facility] [phone]  [place] [facility] [phone]  [facility] [phone]  [facility] [phone]  [facility] [phone]  [facility] [phone]

## 2024-03-02 NOTE — ED Notes (Signed)
Patient given snack.  

## 2024-03-02 NOTE — ED Notes (Addendum)
 This RN in to wake patient up so that Telepsych Consult can be performed.  Patient very agitated, raising his voice with me, refuses to speak to Psychiatry, states, "I cannot stay awake right now to talk to anybody."  EDP, Ward and Telecare Coordinators made aware of need to push back consult time.

## 2024-03-02 NOTE — ED Notes (Signed)
 RN and EDT attempting to obtain EKG. Pt refusing to allow staff to place EKG leads on him. RN attempting to reason with pt and educate pt on importance of EKG. Pt continues to refuse.
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
## 2024-03-02 NOTE — Consult Note (Signed)
 [facility] Psychiatric Consult Initial  Patient Name: .Ernest Romero  MRN: [id number]  DOB: 02-22-1966  Consult Order details:  Orders (From admission, onward)     Start     Ordered   03/02/24 0121  IP CONSULT TO PSYCHIATRY       Ordering Provider: Dewitt Forehand, DO  Provider:  (Not yet assigned)  Question Answer Comment  Consult Timeframe STAT - requires a response within one hour   STAT timeframe requires provider to provider communication, has the provider to provider communication been completed Yes   Reason for Consult? psychosis, mania   Contact phone number where the requesting provider can be reached [phone]      03/02/24 0120   03/02/24 0120  CONSULT TO CALL ACT TEAM       Ordering Provider: Dewitt Forehand, DO  Provider:  (Not yet assigned)  Question:  Reason for Consult?  Answer:  Psych consult   03/02/24 0120             Mode of Visit: In person    Psychiatry Consult Evaluation  Service Date: Mar 02, 2024 LOS:  LOS: 0 days  Chief Complaint "I'm fine. Just leave me alone"  Primary Psychiatric Diagnoses  Polysubstance abuse 2.  Drug seeking behavior   Assessment  Ernest Romero is a 58 y.o. male admitted: Presented to the EDfor 03/02/2024 12:30 AM brought in by ambulance for a face injury asking for klonopin  and percocet. 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   His current presentation of multiple emergency room visits and requesting controlled substances is most consistent with drug seeking behavior and he appears manic indicating decompensated bipolar disorder. He meets criteria for inpatient psychiatric hospitalization based on manic behavior and needing to restart his mood stabilizing medications.  Most recent outpatient psychotropic medications include suboxone , latuda , lithium  and klonopin  and historically he has had a  positive response to these medications. He was not compliant with medications prior to admission as evidenced by patient report and pharmacy fill records. On initial examination, patient is uncooperative and agitated. Please see plan below for detailed recommendations.   Diagnoses:  Active Hospital problems: Principal Problem:   Polysubstance abuse (HCC) Active Problems:   Drug-seeking behavior    Plan   ## Psychiatric Medication Recommendations:  -- Latuda  20mg  PO Q day with food -- Lithium  450mg  PO Q HS  ## Medical Decision Making Capacity: Not specifically addressed in this encounter  ## Further Work-up:  -- Lithium  level  -- most recent EKG on 11/05/2016 had QtC of 452; updated EKG ordered and patient is refusing at this time -- Pertinent labwork reviewed earlier this admission includes: CMP, CBC and alcohol; UDS ordered and patient is refusing at this time   ## Disposition:-- We recommend inpatient psychiatric hospitalization after medical hospitalization. Patient has been involuntarily committed on 03/02/2024.   ## Behavioral / Environmental: - No specific recommendations at this time.     ## Safety and Observation Level:  - Based on my clinical evaluation, I estimate the patient to be at low risk of self harm in the current setting. - At this time, we recommend  routine. This decision is based on my review of the chart including patient's history and current presentation, interview of the patient, mental status examination, and consideration of suicide risk including evaluating suicidal ideation, plan, intent, suicidal or  self-harm behaviors, risk factors, and protective factors. This judgment is based on our ability to directly address suicide risk, implement suicide prevention strategies, and develop a safety plan while the patient is in the clinical setting. Please contact our team if there is a concern that risk level has changed.  CSSR Risk Category:C-SSRS RISK CATEGORY: No  Risk  Suicide Risk Assessment: Patient has following modifiable risk factors for suicide: medication noncompliance, which we are addressing by recommending outpatient follow up with Dr Juel Nutley. Patient has following non-modifiable or demographic risk factors for suicide: male gender Patient has the following protective factors against suicide: Access to outpatient mental health care  Thank you for this consult request. Recommendations have been communicated to the primary team.  We will continue to follow at this time.   Jeraline Moment, NP       History of Present Illness  Relevant Aspects of Hospital ED Course:  Admitted on 03/02/2024 12:30 AM brought in by ambulance for a face injury asking for klonopin  and percocet. 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   Patient Report:  Ernest Romero, is seen face to face by this provider, consulted with Dr. Deborah Falling; and chart reviewed on 03/02/24.  On evaluation Ernest Romero reports he just wants to be left alone.  Patient denies suicidal or homicidal ideation. When asked about hallucinations, he reports he talks to his father and Jesus.  This is patient's 3rd visit to the emergency department this week; he is requesting opiates and benzodiazepines.     Patient is not interested in substance abuse treatment.  According to prescribing data, patient has not refilled his prescriptions since march 2025.  He does appear manic and would benefit from having his mood stabilizing medications restarted.  He has outpatient psychiatric care established with [facility].   During evaluation Ernest Romero is laying in bed in no acute distress.  He is alert & oriented x 3, agitated and uncooperative for this assessment.  His mood is angry with congruent affect.  He has pressured speech and uncooperative behavior.   Objectively there is  evidence of psychosis/mania or delusional thinking. Pt does not appear to be responding to internal or external stimuli.  Patient is not able to converse coherently.  He denies suicidal/self-harm/homicidal ideation and he endorses psychosis and paranoia.  Patient refused to answer most assessment questions.    I personally spent a total of 30 minutes in the care of the patient today including preparing to see the patient, getting/reviewing separately obtained history, performing a medically appropriate exam/evaluation, referring and communicating with other health care professionals, documenting clinical information in the EHR, and independently interpreting results.   Psych ROS:  Depression: UTA Anxiety:  UTA Mania (lifetime and current): current Psychosis: (lifetime and current): current  Collateral information:  Patient refused  Review of Systems  Skin:        Bruising in left eye area  Psychiatric/Behavioral:  Positive for hallucinations and substance abuse.   All other systems reviewed and are negative.    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, benzodiazepine abuse and cannabis abuse Current Psych Provider: [facility] Meds (current): suboxone , latuda , lithium  and klonopin  Previous Med Trials: UTA Therapy: UTA  Prior  Psych Hospitalization: UTA  Prior Self Harm: UTA Prior Violence: UTA  Family Psych History: UTA Family Hx suicide: UTA  Social History:  Developmental Hx: UTA Educational Hx: UTA Occupational Hx: UTA Legal Hx: UTA Living Situation: Lives alone Spiritual Hx: endorses talking to Jesus Access to weapons/lethal means: UTA   Substance History Alcohol: UTA  Tobacco: UTA Illicit drugs: Awaiting UDS Prescription drug abuse: UTA Rehab hx: UTA  Exam Findings   Physical Exam:  Vital Signs:  Temp:  [99.1 F (37.3 C)] 99.1 F (37.3 C) (05/30 2336) Pulse Rate:  [91] 91 (05/30 2336) Resp:  [18] 18 (05/30 2336) BP: (145)/(105) 145/105 (05/30 2336) SpO2:  [99 %] 99 % (05/30 2336) Weight:  [79.4 kg] 79.4 kg (05/30 2337) Blood pressure (!) 145/105, pulse 91, temperature 99.1 F (37.3 C), temperature source Oral, resp. rate 18, height 5\' 11"  (1.803 m), weight 79.4 kg, SpO2 99%. Body mass index is 24.41 kg/m.  Physical Exam Pulmonary:     Effort: Pulmonary effort is normal.  Skin:    General: Skin is dry.  Neurological:     Mental Status: He is alert and oriented to person, place, and time.  Psychiatric:        Attention and Perception: He perceives auditory and visual hallucinations.        Mood and Affect: Affect is labile and angry.        Speech: Speech is rapid and pressured.        Behavior: Behavior is uncooperative and agitated.        Judgment: Judgment is impulsive.     Mental Status Exam: General Appearance: Disheveled  Orientation:  Full (Time, Place, and Person)  Memory:  UTA  Concentration:  Concentration: Poor  Recall:  UTA  Attention  Poor  Eye Contact:  Poor  Speech:  Pressured  Language:  Fair  Volume:  Increased  Mood: Angry and Irritable  Affect:  Labile  Thought Process:  Disorganized  Thought Content:  Hallucinations: Auditory Visual  Suicidal Thoughts:  No  Homicidal Thoughts:  No  Judgement:  Impaired  Insight:  Lacking  Psychomotor Activity:  Restlessness  Akathisia:  No  Fund of Knowledge:  UTA      Assets:  Housing Leisure Time  Cognition:  WNL  ADL's:  Intact  AIMS (if indicated):        Other History   These have been pulled in through the EMR, reviewed, and updated if appropriate.  Family History:  The patient's family history is not on file.  Medical History: Past Medical History:  Diagnosis Date  . Anxiety   . Bipolar 1 disorder (HCC)   . Depression   . Hepatitis   .  Hypertension     Surgical History: History reviewed. No pertinent surgical history.   Medications:   Current Facility-Administered Medications:  .  cephALEXin (KEFLEX) capsule 500 mg, 500 mg, Oral, Q6H, Ward, Kristen N, DO, 500 mg at 03/02/24 0802 .  doxycycline (VIBRA-TABS) tablet 100 mg, 100 mg, Oral, Q12H, Ward, Kristen N, DO, 100 mg at 03/02/24 0138  Current Outpatient Medications:  .  amoxicillin -clavulanate (AUGMENTIN ) 875-125 MG tablet, Take 1 tablet by mouth 2 (two) times daily for 7 days., Disp: 14 tablet, Rfl: 0 .  clonazePAM  (KLONOPIN ) 1 MG tablet, Take 1 tablet (1 mg total) by mouth 2 (two) times daily., Disp: 30 tablet, Rfl: 0 .  lithium  carbonate (ESKALITH ) 450 MG CR tablet, Take 1 tablet (450 mg total) by mouth at bedtime.,  Disp: 30 tablet, Rfl: 0 .  lurasidone  (LATUDA ) 20 MG TABS tablet, Take 20 mg by mouth daily with breakfast., Disp: , Rfl:  .  SUBOXONE  8-2 MG FILM, Place 0.5-1 strips under the tongue 3 (three) times daily as needed (As needed)., Disp: , Rfl: 1  Allergies: No Known Allergies  Damya Comley A Shequita Peplinski, NP

## 2024-03-02 NOTE — ED Provider Notes (Addendum)
 [facility] Provider Note    Event Date/Time   First MD Initiated Contact with Patient 03/02/24 0037     (approximate)   History   Facial Injury and Delusional   HPI  Ernest Romero is a 58 y.o. male with history of hypertension, anxiety, bipolar disorder who presents to the emergency department with multiple complaints.  Patient has an abscess to his right forearm that he is requesting to be "lanced".  States it has not been draining.  Patient also states that he is having "a panic attack" and requesting a refill of his Klonopin .  It appears this medication was just refilled and sent to [facility] pharmacy but states he is not allowed to pick this medication up because he has been trespassed.  States [place] is going to [place].  Patient also asking for someone to reevaluate his left eye.  Recently diagnosed with an orbital wall fracture.  Given ENT follow-up and Augmentin .  He has not followed up with ENT.  He is asking how this is going to be fixed.  Patient also very agitated, yelling at staff here.  Talking to himself, appears to be responding to internal stimuli.  Rapid, pressured speech with tangential thought process.  States that he is talking to [place].  Patient carrying a 2L Dr. Kathlene Paradise with him.   History provided by patient.    Past Medical History:  Diagnosis Date   Anxiety    Bipolar 1 disorder (HCC)    Depression    Hepatitis    Hypertension     History reviewed. No pertinent surgical history.  MEDICATIONS:  Prior to Admission medications   Medication Sig Start Date End Date Taking? Authorizing Provider  amoxicillin -clavulanate (AUGMENTIN ) 875-125 MG tablet Take 1 tablet by mouth 2 (two) times daily for 7 days. 02/27/24 03/05/24  Twilla Galea, MD  clonazePAM  (KLONOPIN ) 1 MG tablet Take 1 tablet (1 mg total) by mouth 2 (two) times daily. 02/25/24   Phyliss Breen, PA-C  lithium  carbonate (ESKALITH ) 450 MG CR tablet Take 1 tablet  (450 mg total) by mouth at bedtime. 06/01/16   Sainani, Vivek J, MD  lurasidone  (LATUDA ) 20 MG TABS tablet Take 20 mg by mouth daily with breakfast.    [provider]  SUBOXONE  8-2 MG FILM Place 0.5-1 strips under the tongue 3 (three) times daily as needed (As needed).    [provider]    Physical Exam   Triage Vital Signs: ED Triage Vitals  Encounter Vitals Group     BP 03/01/24 2336 (!) 145/105     Systolic BP Percentile --      Diastolic BP Percentile --      Pulse Rate 03/01/24 2336 91     Resp 03/01/24 2336 18     Temp 03/01/24 2336 99.1 F (37.3 C)     Temp Source 03/01/24 2336 Oral     SpO2 03/01/24 2336 99 %     Weight 03/01/24 2337 175 lb (79.4 kg)     Height 03/01/24 2337 5\' 11"  (1.803 m)     Head Circumference --      Peak Flow --      Pain Score 03/01/24 2344 0     Pain Loc --      Pain Education --      Exclude from Growth Chart --     Most recent vital signs: Vitals:   03/01/24 2336  BP: (!) 145/105  Pulse: 91  Resp: 18  Temp: 99.1 F (37.3 C)  SpO2: 99%    CONSTITUTIONAL: Alert, patient is extremely agitated.  Yelling and cursing.  Difficult to redirect. HEAD: Normocephalic, patient has left periorbital swelling and ecchymosis. EYES: Patient has left subconjunctival hemorrhage, chemosis.  No hyphema or hypopyon.  Extraocular movements intact.  Unable to assess visual acuity, visual fields due to patient's agitation. ENT: normal nose; moist mucous membranes NECK: Supple, normal ROM CARD: RRR; S1 and S2 appreciated RESP: Normal chest excursion without splinting or tachypnea; breath sounds clear and equal bilaterally; no wheezes, no rhonchi, no rales, no hypoxia or respiratory distress, speaking full sentences ABD/GI: Non-distended; soft, non-tender, no rebound, no guarding, no peritoneal signs BACK: The back appears normal EXT: Normal ROM in all joints; no deformity noted, no edema SKIN: Normal color for age and race; warm; patient  has a 3 cm tender, fluctuant, indurated area to the mid right forearm without drainage NEURO: Moves all extremities equally, normal speech PSYCH: Patient is extremely agitated, raising voice, cursing.  Appears to be responding to internal stimuli.  Rapid, pressured speech with tangential thought process.  Has religious delusions.  Patient rambling.  He is talking to himself.  Difficult to redirect.   ED Results / Procedures / Treatments   LABS: (all labs ordered are listed, but only abnormal results are displayed) Labs Reviewed  COMPREHENSIVE METABOLIC PANEL WITH GFR - Abnormal; Notable for the following components:      Result Value   BUN 23 (*)    AST 47 (*)    All other components within normal limits  AEROBIC CULTURE W GRAM STAIN (SUPERFICIAL SPECIMEN)  ETHANOL  CBC WITH DIFFERENTIAL/PLATELET  URINE DRUG SCREEN, QUALITATIVE ([facility] ONLY)     EKG:  EKG Interpretation Date/Time:    Ventricular Rate:    PR Interval:    QRS Duration:    QT Interval:    QTC Calculation:   R Axis:      Text Interpretation:           RADIOLOGY: My personal review and interpretation of imaging:    I have personally reviewed all radiology reports.   No results found.   PROCEDURES:  Critical Care performed: Yes, see critical care procedure note(s)   CRITICAL CARE Performed by: Verneda Golder   Total critical care time: 30 minutes  Critical care time was exclusive of separately billable procedures and treating other patients.  Critical care was necessary to treat or prevent imminent or life-threatening deterioration.  Critical care was time spent personally by me on the following activities: development of treatment plan with patient and/or surrogate as well as nursing, discussions with consultants, evaluation of patient's response to treatment, examination of patient, obtaining history from patient or surrogate, ordering and performing treatments and interventions, ordering and  review of laboratory studies, ordering and review of radiographic studies, pulse oximetry and re-evaluation of patient's condition.   Procedures  INCISION AND DRAINAGE Performed by: Starling Eck Andreah Goheen Consent: Verbal consent obtained. Risks and benefits: risks, benefits and alternatives were discussed Type: abscess  Body area: Right forearm  Anesthesia: local infiltration  Incision was made with a scalpel.  Local anesthetic: lidocaine  1% without epinephrine   Anesthetic total: 4 ml  Complexity: complex Blunt dissection to break up loculations  Drainage: purulent  Drainage amount: Small  Packing material: None  Patient tolerance: Patient tolerated the procedure well with no immediate complications.     IMPRESSION / MDM / ASSESSMENT AND PLAN / ED COURSE  I reviewed  the triage vital signs and the nursing notes.    Patient here for multiple complaints.  He is manic, psychotic here with religious delusions.     DIFFERENTIAL DIAGNOSIS (includes but not limited to):   Mania, psychosis, intoxication, homelessness, malingering, drug use   Patient's presentation is most consistent with acute presentation with potential threat to life or bodily function.   PLAN: Will obtain screening labs, urine.  Patient is extremely agitated, difficult to redirect.  He appears manic, psychotic here with religious delusions.  Will place him under IVC and give medications.  He also has an abscess with mild amount of surrounding cellulitis to the right forearm.  This will need incision and drainage.  Will start antibiotics.  Patient also requesting refills of his Klonopin  and prescription for Percocet for his orbital floor fracture.  He has a documented history of substance use disorder and appears he was seeing a doctor in [place] that was prescribing him Suboxone  and Klonopin  until mid March 2025.  He states that this doctor is on medical leave and that no one else can fill his prescriptions.   He was just in the emergency department and had a prescription of his Klonopin  filled to [facility] pharmacy but states that he was trespassed [employer] property and was not able to pick up this prescription and called yelling at ED staff asking for it to be transferred to another pharmacy.   MEDICATIONS GIVEN IN ED: Medications  cephALEXin (KEFLEX) capsule 500 mg (500 mg Oral Given 03/02/24 0138)  doxycycline (VIBRA-TABS) tablet 100 mg (100 mg Oral Given 03/02/24 0138)  ibuprofen  (ADVIL ) tablet 800 mg (800 mg Oral Given 03/02/24 0110)  hydrOXYzine (ATARAX) tablet 25 mg (25 mg Oral Given 03/02/24 0110)  ziprasidone (GEODON) injection 20 mg (20 mg Intramuscular Given 03/02/24 0138)  lidocaine  (PF) (XYLOCAINE ) 1 % injection 5 mL (5 mLs Intradermal Given 03/02/24 0138)     ED COURSE: Patient continues to be extremely agitated despite oral medications.  He is agreeable to IM Geodon.  Screening labs show normal hemoglobin.  Normal electrolytes, creatinine.  Minimal elevation of his AST.  Ethanol level negative.  Drug screen pending.   3:30 AM  Pt more calm but still intermittently agitated after Geodon.  I was able to incise and drain the abscess to his arm.  Will put him on antibiotics to cover for strep, staph, MRSA.  Culture pending.  No systemic symptoms.  Doubt sepsis, bacteremia.   CONSULTS: TTS and psychiatry consulted for further disposition.   OUTSIDE RECORDS REVIEWED: Reviewed last PCP note on 08/30/2022.       FINAL CLINICAL IMPRESSION(S) / ED DIAGNOSES   Final diagnoses:  Closed fracture of left orbit with routine healing, subsequent encounter  Homelessness  Encounter for medication refill  Manic behavior (HCC)  Psychosis, unspecified psychosis type (HCC)  Cellulitis and abscess of upper extremity     Rx / DC Orders   ED Discharge Orders     None        Note:  This document was prepared using Dragon voice recognition software and may include unintentional  dictation errors.   Ayodele Hartsock, Clover Dao, DO 03/02/24 1610    Mavin Dyke, Clover Dao, DO 03/02/24 0419    Sonni Barse, Clover Dao, DO 03/02/24 [phone]

## 2024-03-02 NOTE — BH Assessment (Signed)
 Patient has been accepted to [facility].  Patient assigned to [facility] Accepting physician is Dr. Amy Kansky.  Call report to [phone].  Representative was [employer].   ER Staff is aware of it:  [place], ER Secretary      Address: [address]  [place], [place] [address]  The bed is available tomorrow (03/03/2024), after 10am

## 2024-03-02 NOTE — ED Notes (Signed)
 Pt given breakfast at this time.

## 2024-03-02 NOTE — ED Notes (Addendum)
 RN, EDT, TTS, and NP at bedside. Meal provided at this time. RN and EDT attempting to get EKG on pt. Pt refusing at this time. Pt stating he just wants to eat breakfast and go back to sleep. Pt educated that we have to do this to move the process forward. TTS and NP at bedside to do evaluation. Pt becoming agitated and refusing to answer some questions for NP. NP aware that pt is refusing EKG at this time.

## 2024-03-02 NOTE — ED Notes (Signed)
 Pt agreeable to take keflex at this time. Pt refusing to do psych evaluation at this time. Pt encouraged to talk to psychiatry. Pt becoming irritable and agitated at this time. NP made aware.

## 2024-03-03 NOTE — ED Notes (Signed)
 Pt breakfast provided at bedside

## 2024-03-03 NOTE — ED Notes (Signed)
EMTALA Reviewed by this RN at this time.

## 2024-03-03 NOTE — ED Provider Notes (Signed)
 Vitals:   03/03/24 1010 03/03/24 1030  BP: (!) 146/99 (!) 146/99  Pulse: 75 75  Resp: 17 17  Temp: 97.8 F (36.6 C) 97.8 F (36.6 C)  SpO2: 95% 95%    Patient ambulating back-and-forth to the bathroom.  Fully alert in no distress he is understanding and agreeable with plan for transfer.  Remains under IVC   Iver Marker, MD 03/03/24 1034

## 2024-03-03 NOTE — ED Notes (Addendum)
 Patient walked out with Security and and sheriff office. Patient belongings given to Emergency planning/management officer.

## 2024-03-03 NOTE — ED Provider Notes (Signed)
 Emergency Medicine Observation Re-evaluation Note  Ernest Romero is a 58 y.o. male, seen on rounds today.  Pt initially presented to the ED for complaints of Facial Injury and Delusional Currently, the patient is resting, voices no medical complaints.  Physical Exam  BP 129/83   Pulse 65   Temp 97.7 F (36.5 C) (Oral)   Resp 18   Ht 5\' 11"  (1.803 m)   Wt 79.4 kg   SpO2 96%   BMI 24.41 kg/m  Physical Exam General: Resting in no acute distress Cardiac: No cyanosis Lungs: Equal rise and fall Psych: Not agitated  ED Course / MDM  EKG:   I have reviewed the labs performed to date as well as medications administered while in observation.  Recent changes in the last 24 hours include no events overnight.  Plan  Current plan is for psychiatric disposition.    Jash Wahlen J, MD 03/03/24 [phone]

## 2024-03-03 NOTE — ED Notes (Signed)
 Patient standing at the doorway asking for food. Patient notified that breakfast is on the way.

## 2024-03-04 LAB — AEROBIC CULTURE W GRAM STAIN (SUPERFICIAL SPECIMEN)

## 2024-03-18 ENCOUNTER — Other Ambulatory Visit: Payer: Self-pay

## 2024-03-18 ENCOUNTER — Emergency Department
Admission: EM | Admit: 2024-03-18 | Discharge: 2024-03-18 | Disposition: A | Discharge status: Home / Self Care | Payer: MEDICAID | Attending: Emergency Medicine | Admitting: Emergency Medicine

## 2024-03-18 DIAGNOSIS — M79631 Pain in right forearm: Secondary | ICD-10-CM | POA: Diagnosis present

## 2024-03-18 DIAGNOSIS — L02433 Carbuncle of right upper limb: Secondary | ICD-10-CM | POA: Diagnosis not present

## 2024-03-18 DIAGNOSIS — I10 Essential (primary) hypertension: Secondary | ICD-10-CM | POA: Insufficient documentation

## 2024-03-18 MED ORDER — CEPHALEXIN 500 MG PO CAPS
500.0000 mg | ORAL_CAPSULE | Freq: Once | ORAL | Status: AC
  Administered 2024-03-18: 500 mg via ORAL
  Filled 2024-03-18: qty 1

## 2024-03-18 MED ORDER — IBUPROFEN 600 MG PO TABS
600.0000 mg | ORAL_TABLET | Freq: Once | ORAL | Status: AC
  Administered 2024-03-18: 600 mg via ORAL
  Filled 2024-03-18: qty 1

## 2024-03-18 MED ORDER — NAPROXEN 500 MG PO TABS: 500.0000 mg | ORAL_TABLET | Freq: Two times a day (BID) | ORAL | 0 refills | Status: DC

## 2024-03-18 MED ORDER — CEPHALEXIN 500 MG PO CAPS: 500.0000 mg | ORAL_CAPSULE | Freq: Three times a day (TID) | ORAL | 0 refills | Status: DC
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
## 2024-03-18 NOTE — ED Triage Notes (Signed)
 Pt comes with right arm abscess. Pt has two spots to his arm.

## 2024-03-18 NOTE — ED Notes (Signed)
 Pt is CAOx4, breathing normally, and normal in color. Pt is being loud and boisterous, complaining of 10/10 right arm pain from a sore. When trying to assess the sore, the pt jerks away and yells Don't touch it! Don't even look at it! When asking where the sore came originated from, the pt gets very defensive and loud and states I don't know! Why are you asking me that? When assessing pt's vitals, pt is squirmy and refuses to sit still stating that he can't sit still.

## 2024-03-18 NOTE — ED Notes (Signed)
 When trying to discharge the pt, the pt gets furious and yelled Are you serious? I'm being sent home with pain like this? I will go to a lawyer about this!. Following that out burst, pt seemed calm and agreeable with his discharge.

## 2024-03-18 NOTE — Discharge Instructions (Signed)
 Call make an appoint with your primary care provider at [facility]. Clean the area daily with mild soap and water.  You may also use warm moist compresses to the area frequently to help the area continuing to improve.  A prescription for antibiotics and symptom for inflammation was sent to the pharmacy for you to begin taking. Also take your blood pressure medication every day to control your blood pressure as it was elevated in the emergency department.

## 2024-04-09 ENCOUNTER — Other Ambulatory Visit: Payer: Self-pay

## 2024-04-09 ENCOUNTER — Encounter: Payer: Self-pay | Admitting: Emergency Medicine

## 2024-04-09 DIAGNOSIS — Z5321 Procedure and treatment not carried out due to patient leaving prior to being seen by health care provider: Secondary | ICD-10-CM | POA: Insufficient documentation

## 2024-04-09 DIAGNOSIS — W57XXXA Bitten or stung by nonvenomous insect and other nonvenomous arthropods, initial encounter: Secondary | ICD-10-CM | POA: Diagnosis not present

## 2024-04-09 DIAGNOSIS — T148XXA Other injury of unspecified body region, initial encounter: Secondary | ICD-10-CM | POA: Insufficient documentation
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
## 2024-04-10 ENCOUNTER — Emergency Department
Admission: EM | Admit: 2024-04-10 | Discharge: 2024-04-10 | Discharge status: Against Medical Advice | Payer: MEDICAID | Attending: Emergency Medicine | Admitting: Emergency Medicine

## 2024-04-10 NOTE — ED Notes (Signed)
 Pt yelling and swearing in lobby at other pts. Pt communicating threats to pts and visitors in lobby as well as staff. Pt moved away from others and continues to escalate. Security and Print production planner pt out of ED and off hospital campus.
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
## 2024-04-10 NOTE — ED Notes (Signed)
 Pt seen approaching other pts and visitors in lobby and instructed to leave others alone. Pt back to his seat at this time.

## 2024-04-11 ENCOUNTER — Other Ambulatory Visit: Payer: Self-pay

## 2024-04-11 ENCOUNTER — Emergency Department: Admission: EM | Admit: 2024-04-11 | Discharge: 2024-04-11 | Discharge status: Against Medical Advice | Payer: MEDICAID

## 2024-04-11 NOTE — ED Triage Notes (Signed)
 Pt presents via EMS c/o insect bite and possible abscess to left forearm. Seen here for same yesterday.

## 2024-04-13 ENCOUNTER — Emergency Department (HOSPITAL_BASED_OUTPATIENT_CLINIC_OR_DEPARTMENT_OTHER)
Admission: EM | Admit: 2024-04-13 | Discharge: 2024-04-13 | Disposition: A | Discharge status: Home / Self Care | Payer: MEDICAID

## 2024-04-13 ENCOUNTER — Encounter (HOSPITAL_BASED_OUTPATIENT_CLINIC_OR_DEPARTMENT_OTHER): Payer: Self-pay

## 2024-04-13 ENCOUNTER — Other Ambulatory Visit: Payer: Self-pay

## 2024-04-13 ENCOUNTER — Ambulatory Visit (HOSPITAL_COMMUNITY)
Admission: EM | Admit: 2024-04-13 | Discharge: 2024-04-13 | Discharge status: Against Medical Advice | Payer: MEDICAID | Source: Other Acute Inpatient Hospital

## 2024-04-13 DIAGNOSIS — Z8659 Personal history of other mental and behavioral disorders: Secondary | ICD-10-CM | POA: Insufficient documentation

## 2024-04-13 DIAGNOSIS — M21612 Bunion of left foot: Secondary | ICD-10-CM | POA: Diagnosis not present

## 2024-04-13 DIAGNOSIS — L02414 Cutaneous abscess of left upper limb: Secondary | ICD-10-CM | POA: Insufficient documentation

## 2024-04-13 DIAGNOSIS — M21619 Bunion of unspecified foot: Secondary | ICD-10-CM

## 2024-04-13 MED ORDER — LIDOCAINE HCL (PF) 1 % IJ SOLN
10.0000 mL | Freq: Once | INTRAMUSCULAR | Status: AC
  Administered 2024-04-13: 5 mL
  Filled 2024-04-13: qty 10

## 2024-04-13 NOTE — ED Provider Notes (Signed)
 [facility] AT [facility] Provider Note   CSN: [id number] Arrival date & time: 04/13/24  1026     Patient presents with: Abscess   Ernest Romero is a 58 y.o. male.  {Add pertinent medical, surgical, social history, OB history to HPI:2920} 58 year old male presents for evaluation of multiple complaints.  He has an abscess on his left forearm that he states he opened up last night but it is still hurting.  Also is here for bunion on his left toe as well as refill of his mental health medications.  States he has not seen his doctor in 3 months.  Denies any other symptoms or concerns.   Abscess Associated symptoms: no fever and no vomiting        Prior to Admission medications   Medication Sig Start Date End Date Taking? Authorizing Provider  amLODipine  (NORVASC ) 5 MG tablet Take 5 mg by mouth daily. 11/28/23   [provider]  cephALEXin  (KEFLEX ) 500 MG capsule Take 1 capsule (500 mg total) by mouth 3 (three) times daily. 03/18/24   Saunders Shona CROME, PA-C  clonazePAM  (KLONOPIN ) 1 MG tablet Take 1 tablet (1 mg total) by mouth 2 (two) times daily. 02/25/24   Cleaster Tinnie LABOR, PA-C  ketoconazole (NIZORAL) 2 % cream Apply a small amount, 1-2 g, to affected area on feet twice a day for 2 to 4 weeks. 11/28/23   [provider]  lithium  carbonate (ESKALITH ) 450 MG CR tablet Take 1 tablet (450 mg total) by mouth at bedtime. 06/01/16   Sainani, Vivek J, MD  lurasidone  (LATUDA ) 20 MG TABS tablet Take 20 mg by mouth daily with breakfast.    [provider]  naproxen  (NAPROSYN ) 500 MG tablet Take 1 tablet (500 mg total) by mouth 2 (two) times daily with a meal. 03/18/24   Saunders Shona CROME, PA-C  SUBOXONE  8-2 MG FILM Place one strip under the tongue twice daily in the morning and evening and one-half strip at midday as directed.    [provider]    Allergies: Patient has no known allergies.    Review of Systems  Constitutional:   Negative for chills and fever.  HENT:  Negative for ear pain and sore throat.   Eyes:  Negative for pain and visual disturbance.  Respiratory:  Negative for cough and shortness of breath.   Cardiovascular:  Negative for chest pain and palpitations.  Gastrointestinal:  Negative for abdominal pain and vomiting.  Genitourinary:  Negative for dysuria and hematuria.  Musculoskeletal:  Negative for arthralgias and back pain.       Admits left forearm abscess, admits toe pain  Skin:  Negative for color change and rash.  Neurological:  Negative for seizures and syncope.  Psychiatric/Behavioral:  Negative for agitation, behavioral problems, confusion, decreased concentration, dysphoric mood, hallucinations, self-injury, sleep disturbance and suicidal ideas. The patient is not nervous/anxious and is not hyperactive.   All other systems reviewed and are negative.   Updated Vital Signs BP (!) 153/120   Pulse 84   Temp (!) 97.5 F (36.4 C)   Resp 12   Ht 5' 10 (1.778 m)   Wt 79.4 kg   SpO2 100%   BMI 25.12 kg/m   Physical Exam Vitals and nursing note reviewed.  Constitutional:      General: He is not in acute distress.    Appearance: Normal appearance. He is well-developed. He is not ill-appearing.     Comments: Disheveled appearing  HENT:     Head: Normocephalic and atraumatic.  Eyes:     Conjunctiva/sclera: Conjunctivae normal.  Cardiovascular:     Rate and Rhythm: Normal rate and regular rhythm.     Heart sounds: No murmur heard. Pulmonary:     Effort: Pulmonary effort is normal. No respiratory distress.     Breath sounds: Normal breath sounds.  Abdominal:     Palpations: Abdomen is soft.     Tenderness: There is no abdominal tenderness.  Musculoskeletal:        General: No swelling.     Cervical back: Neck supple.     Comments: Left forearm with 3 by 3 cm circumferential abscess with open wound on top with clear drainage, left great toe with bunion that is tender to  palpation but no signs of infection or abscess or other abnormality of the toe or foot, no open wound   Skin:    General: Skin is warm and dry.     Capillary Refill: Capillary refill takes less than 2 seconds.  Neurological:     General: No focal deficit present.     Mental Status: He is alert.  Psychiatric:        Mood and Affect: Mood normal.        Behavior: Behavior normal.        Thought Content: Thought content normal.     (all labs ordered are listed, but only abnormal results are displayed) Labs Reviewed - No data to display  EKG: None  Radiology: No results found.  {Document cardiac monitor, telemetry assessment procedure when appropriate:32947} Procedures   Medications Ordered in the ED  lidocaine  (PF) (XYLOCAINE ) 1 % injection 10 mL (has no administration in time range)      {Click here for ABCD2, HEART and other calculators REFRESH Note before signing:1}                              Medical Decision Making Risk Prescription drug management.   ***  {Document critical care time when appropriate  Document review of labs and clinical decision tools ie CHADS2VASC2, etc  Document your independent review of radiology images and any outside records  Document your discussion with family members, caretakers and with consultants  Document social determinants of health affecting pt's care  Document your decision making why or why not admission, treatments were needed:32947:::1}   Final diagnoses:  None    ED Discharge Orders     None
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
## 2024-04-13 NOTE — ED Provider Notes (Signed)
 Patient became more and more agitated while in the waiting area, reporting that he does not want to be seen by black staff members. He started cursing and calling bad names. He started throwing things, asking for Klonopin . He then left before evaluation, agitated.

## 2024-04-13 NOTE — ED Notes (Addendum)
 Pt was sitting in the lobby and started to blow his nose onto the floor. This writer asked the pt to please go in the bathroom and wash his hands & face. Pt went into the bathroom to clean himself up and came out yelling. Pt was asked by security to refrain from yelling and move his items from all over the lobby. Pt began yelling louder and calling this IT trainer. Pt yelled where are the white people because I don't want to talk with you people. Pt stated that he was leaving and walked out the building.

## 2024-04-13 NOTE — ED Triage Notes (Signed)
 In for eval of abscess to left forearm. Also complains of his feet and mental health problems.

## 2024-04-13 NOTE — Discharge Instructions (Signed)
 Keep your wound clean and dry.  You can change the dressing daily.  Monitor signs of infection.  Follow-up with podiatry for your bunion on your toe.  The mental health office is listed below you can go earlier today and discussed with them your medications.  Return to the ER for new or worsening symptoms.

## 2024-04-15 ENCOUNTER — Ambulatory Visit (HOSPITAL_COMMUNITY)
Admission: EM | Admit: 2024-04-15 | Discharge: 2024-04-15 | Disposition: A | Discharge status: Home / Self Care | Payer: MEDICAID | Attending: Psychiatry | Admitting: Psychiatry

## 2024-04-15 ENCOUNTER — Other Ambulatory Visit: Payer: Self-pay

## 2024-04-15 ENCOUNTER — Emergency Department (HOSPITAL_COMMUNITY): Payer: MEDICAID

## 2024-04-15 ENCOUNTER — Encounter (HOSPITAL_COMMUNITY): Payer: Self-pay | Admitting: Emergency Medicine

## 2024-04-15 ENCOUNTER — Emergency Department (HOSPITAL_COMMUNITY)
Admission: EM | Admit: 2024-04-15 | Discharge: 2024-04-15 | Disposition: A | Discharge status: Home / Self Care | Payer: MEDICAID | Attending: Emergency Medicine | Admitting: Emergency Medicine

## 2024-04-15 DIAGNOSIS — Z765 Malingerer [conscious simulation]: Secondary | ICD-10-CM | POA: Insufficient documentation

## 2024-04-15 DIAGNOSIS — F1010 Alcohol abuse, uncomplicated: Secondary | ICD-10-CM | POA: Diagnosis present

## 2024-04-15 DIAGNOSIS — Z79899 Other long term (current) drug therapy: Secondary | ICD-10-CM | POA: Diagnosis not present

## 2024-04-15 DIAGNOSIS — W19XXXA Unspecified fall, initial encounter: Secondary | ICD-10-CM | POA: Diagnosis not present

## 2024-04-15 DIAGNOSIS — S0990XA Unspecified injury of head, initial encounter: Secondary | ICD-10-CM

## 2024-04-15 DIAGNOSIS — I10 Essential (primary) hypertension: Secondary | ICD-10-CM | POA: Diagnosis not present

## 2024-04-15 DIAGNOSIS — S0101XA Laceration without foreign body of scalp, initial encounter: Secondary | ICD-10-CM | POA: Insufficient documentation

## 2024-04-15 DIAGNOSIS — R4182 Altered mental status, unspecified: Secondary | ICD-10-CM | POA: Diagnosis present

## 2024-04-15 LAB — MAGNESIUM: Magnesium: 2.3 mg/dL (ref 1.7–2.4)

## 2024-04-15 LAB — RAPID URINE DRUG SCREEN, HOSP PERFORMED
Amphetamines: NOT DETECTED
Barbiturates: NOT DETECTED
Benzodiazepines: NOT DETECTED
Cocaine: NOT DETECTED
Opiates: NOT DETECTED
Tetrahydrocannabinol: POSITIVE — AB

## 2024-04-15 LAB — CBC WITH DIFFERENTIAL/PLATELET
Abs Immature Granulocytes: 0.01 K/uL (ref 0.00–0.07)
Basophils Absolute: 0 K/uL (ref 0.0–0.1)
Basophils Relative: 0 %
Eosinophils Absolute: 0.1 K/uL (ref 0.0–0.5)
Eosinophils Relative: 2 %
HCT: 38.8 % — ABNORMAL LOW (ref 39.0–52.0)
Hemoglobin: 12.7 g/dL — ABNORMAL LOW (ref 13.0–17.0)
Immature Granulocytes: 0 %
Lymphocytes Relative: 22 %
Lymphs Abs: 1.3 K/uL (ref 0.7–4.0)
MCH: 29.5 pg (ref 26.0–34.0)
MCHC: 32.7 g/dL (ref 30.0–36.0)
MCV: 90 fL (ref 80.0–100.0)
Monocytes Absolute: 0.4 K/uL (ref 0.1–1.0)
Monocytes Relative: 6 %
Neutro Abs: 4 K/uL (ref 1.7–7.7)
Neutrophils Relative %: 70 %
Platelets: 206 K/uL (ref 150–400)
RBC: 4.31 MIL/uL (ref 4.22–5.81)
RDW: 13.4 % (ref 11.5–15.5)
WBC: 5.8 K/uL (ref 4.0–10.5)
nRBC: 0 % (ref 0.0–0.2)

## 2024-04-15 LAB — URINALYSIS, ROUTINE W REFLEX MICROSCOPIC
Bilirubin Urine: NEGATIVE
Glucose, UA: NEGATIVE mg/dL
Hgb urine dipstick: NEGATIVE
Ketones, ur: NEGATIVE mg/dL
Leukocytes,Ua: NEGATIVE
Nitrite: NEGATIVE
Protein, ur: 30 mg/dL — AB
Specific Gravity, Urine: 1.021 (ref 1.005–1.030)
pH: 5 (ref 5.0–8.0)

## 2024-04-15 LAB — COMPREHENSIVE METABOLIC PANEL WITH GFR
ALT: 28 U/L (ref 0–44)
AST: 39 U/L (ref 15–41)
Albumin: 3.3 g/dL — ABNORMAL LOW (ref 3.5–5.0)
Alkaline Phosphatase: 79 U/L (ref 38–126)
Anion gap: 10 (ref 5–15)
BUN: 11 mg/dL (ref 6–20)
CO2: 25 mmol/L (ref 22–32)
Calcium: 8.2 mg/dL — ABNORMAL LOW (ref 8.9–10.3)
Chloride: 104 mmol/L (ref 98–111)
Creatinine, Ser: 1.06 mg/dL (ref 0.61–1.24)
GFR, Estimated: >60 mL/min (ref >60)
Glucose, Bld: 134 mg/dL — ABNORMAL HIGH (ref 70–99)
Potassium: 3.8 mmol/L (ref 3.5–5.1)
Sodium: 139 mmol/L (ref 135–145)
Total Bilirubin: 0.7 mg/dL (ref 0.0–1.2)
Total Protein: 6 g/dL — ABNORMAL LOW (ref 6.5–8.1)

## 2024-04-15 LAB — CBG MONITORING, ED: Glucose-Capillary: 127 mg/dL — ABNORMAL HIGH (ref 70–99)

## 2024-04-15 LAB — PHOSPHORUS: Phosphorus: 2.8 mg/dL (ref 2.5–4.6)

## 2024-04-15 LAB — ETHANOL: Alcohol, Ethyl (B): <15 mg/dL (ref <15)

## 2024-04-15 MED ORDER — LIDOCAINE-EPINEPHRINE (PF) 2 %-1:200000 IJ SOLN: 10.0000 mL | Freq: Once | INTRAMUSCULAR | Status: DC

## 2024-04-15 MED ORDER — MORPHINE SULFATE (PF) 4 MG/ML IV SOLN
4.0000 mg | Freq: Once | INTRAVENOUS | Status: AC
  Administered 2024-04-15: 4 mg via SUBCUTANEOUS
  Filled 2024-04-15: qty 1

## 2024-04-15 MED ORDER — LORAZEPAM 1 MG PO TABS
1.0000 mg | ORAL_TABLET | Freq: Once | ORAL | Status: AC
  Administered 2024-04-15: 1 mg via ORAL
  Filled 2024-04-15: qty 1

## 2024-04-15 NOTE — ED Provider Notes (Signed)
 [facility] Continuous Assessment Admission H&P  Date: 04/16/24 Patient Name: Ernest Romero MRN: [id number] Chief Complaint: need medication   Diagnoses:  Final diagnoses:  Malingering  Alcohol abuse    HPI: Ernest Romero, 58 y/o male with a history of bipolar disorder, major depressive disorder, homelessness, alcohol abuse..  Presented to [facility] via [facility]PD.  According to GED patient was found at [facility] parking lot.  Per the patient he needs to get his medication refill when asked what medicines he taken patient said Klonopin  Clinical research associate discussed with patient that I cannot refill his Klonopin  and he would need to reach out to his provider patient stated well give me some Ativan  and then.  Writer discussed with patient that we can admit him but we cannot give him Klonopin  and Ativan  just like that.  Review of patient records show that the patient has been to the ED and multiple times seeking medications.   Face-to-face evaluation of patient, patient is alert and oriented x 4, speech is clear, maintain eye contact.  Patient denies SI, HI, AVH or paranoia.  Denies wanting to hurt himself or others.  The patient he drank 2 beers today.  Patient denied illicit drug use at this time.  Writer discussed with patient that we can admit him for overnight observation.  Patient stated that he does not want to stay if we are not going to give him the Klonopin  or the Ativan  is that is what he takes.  Writer discussed with patient that it is okay for us  to admit him however he cannot come in the manner that he needs Klonopin  and Ativan .  Patient decided to leave AMA stating that if he did not get the Ativan  he is not staying.  Total Time spent with patient: 20 minutes  Musculoskeletal  Strength & Muscle Tone: within normal limits Gait & Station: normal Patient leans: N/A  Psychiatric Specialty Exam  Presentation General Appearance:  Casual  Eye Contact: Good  Speech: Clear and Coherent  Speech  Volume: Normal  Handedness: Right   Mood and Affect  Mood: Anxious; Angry  Affect: Congruent   Thought Process  Thought Processes: Coherent  Descriptions of Associations:Intact  Orientation:Full (Time, Place and Person)  Thought Content:WDL; Obsessions  Diagnosis of Schizophrenia or Schizoaffective disorder in past: No data recorded Duration of Psychotic Symptoms: No data recorded Hallucinations:Hallucinations: None  Ideas of Reference:None  Suicidal Thoughts:Suicidal Thoughts: No  Homicidal Thoughts:Homicidal Thoughts: No   Sensorium  Memory: Immediate Fair  Judgment: Fair  Insight: Fair   Art therapist  Concentration: Fair  Attention Span: Fair  Recall: Fair  Fund of Knowledge: Fair  Language: Fair   Psychomotor Activity  Psychomotor Activity: Psychomotor Activity: Normal   Assets  Assets: Desire for Improvement; Housing; Resilience   Sleep  Sleep: Sleep: Fair Number of Hours of Sleep: 6   Nutritional Assessment (For OBS and FBC admissions only) Has the patient had a weight loss or gain of 10 pounds or more in the last 3 months?: No Has the patient had a decrease in food intake/or appetite?: No Does the patient have dental problems?: No Does the patient have eating habits or behaviors that may be indicators of an eating disorder including binging or inducing vomiting?: No Has the patient recently lost weight without trying?: 0 Has the patient been eating poorly because of a decreased appetite?: 0 Malnutrition Screening Tool Score: 0    Physical Exam HENT:     Head: Normocephalic.  Nose: Nose normal.  Eyes:     Pupils: Pupils are equal, round, and reactive to light.  Cardiovascular:     Rate and Rhythm: Normal rate.  Pulmonary:     Effort: Pulmonary effort is normal.  Musculoskeletal:        General: Normal range of motion.     Cervical back: Normal range of motion.  Neurological:     General: No focal  deficit present.     Mental Status: He is alert.  Psychiatric:        Mood and Affect: Mood normal.        Behavior: Behavior normal.        Thought Content: Thought content normal.        Judgment: Judgment normal.    ROS  Blood pressure (!) 161/107, pulse 84, temperature 98.8 F (37.1 C), temperature source Oral, resp. rate 16, SpO2 98%. There is no height or weight on file to calculate BMI.  Past Psychiatric History: Alcohol abuse, bipolar disorder, homelessness  Is the patient at risk to self? No  Has the patient been a risk to self in the past 6 months? No .    Has the patient been a risk to self within the distant past? No   Is the patient a risk to others? No   Has the patient been a risk to others in the past 6 months? No   Has the patient been a risk to others within the distant past? No   Past Medical History: See chart  Family History: Unknown  Social History: Alcohol  Last Labs:  Admission on 04/15/2024, Discharged on 04/15/2024  Component Date Value Ref Range Status   Sodium 04/15/2024 139  135 - 145 mmol/L Final   Potassium 04/15/2024 3.8  3.5 - 5.1 mmol/L Final   Chloride 04/15/2024 104  98 - 111 mmol/L Final   CO2 04/15/2024 25  22 - 32 mmol/L Final   Glucose, Bld 04/15/2024 134 (H)  70 - 99 mg/dL Final   Glucose reference range applies only to samples taken after fasting for at least 8 hours.   BUN 04/15/2024 11  6 - 20 mg/dL Final   Creatinine, Ser 04/15/2024 1.06  0.61 - 1.24 mg/dL Final   Calcium 92/85/7974 8.2 (L)  8.9 - 10.3 mg/dL Final   Total Protein 92/85/7974 6.0 (L)  6.5 - 8.1 g/dL Final   Albumin 92/85/7974 3.3 (L)  3.5 - 5.0 g/dL Final   AST 92/85/7974 39  15 - 41 U/L Final   ALT 04/15/2024 28  0 - 44 U/L Final   Alkaline Phosphatase 04/15/2024 79  38 - 126 U/L Final   Total Bilirubin 04/15/2024 0.7  0.0 - 1.2 mg/dL Final   GFR, Estimated 04/15/2024 >60  >60 mL/min Final   Comment: (NOTE) Calculated using the CKD-EPI Creatinine Equation  (2021)    Anion gap 04/15/2024 10  5 - 15 Final   Performed at [facility], [address], [place], [place] [address]   Glucose-Capillary 04/15/2024 127 (H)  70 - 99 mg/dL Final   Glucose reference range applies only to samples taken after fasting for at least 8 hours.   WBC 04/15/2024 5.8  4.0 - 10.5 K/uL Final   RBC 04/15/2024 4.31  4.22 - 5.81 MIL/uL Final   Hemoglobin 04/15/2024 12.7 (L)  13.0 - 17.0 g/dL Final   HCT 92/85/7974 38.8 (L)  39.0 - 52.0 % Final   MCV 04/15/2024 90.0  80.0 -  100.0 fL Final   MCH 04/15/2024 29.5  26.0 - 34.0 pg Final   MCHC 04/15/2024 32.7  30.0 - 36.0 g/dL Final   RDW 92/85/7974 13.4  11.5 - 15.5 % Final   Platelets 04/15/2024 206  150 - 400 K/uL Final   nRBC 04/15/2024 0.0  0.0 - 0.2 % Final   Neutrophils Relative % 04/15/2024 70  % Final   Neutro Abs 04/15/2024 4.0  1.7 - 7.7 K/uL Final   Lymphocytes Relative 04/15/2024 22  % Final   Lymphs Abs 04/15/2024 1.3  0.7 - 4.0 K/uL Final   Monocytes Relative 04/15/2024 6  % Final   Monocytes Absolute 04/15/2024 0.4  0.1 - 1.0 K/uL Final   Eosinophils Relative 04/15/2024 2  % Final   Eosinophils Absolute 04/15/2024 0.1  0.0 - 0.5 K/uL Final   Basophils Relative 04/15/2024 0  % Final   Basophils Absolute 04/15/2024 0.0  0.0 - 0.1 K/uL Final   Immature Granulocytes 04/15/2024 0  % Final   Abs Immature Granulocytes 04/15/2024 0.01  0.00 - 0.07 K/uL Final   Performed at [facility], [address], [place], [place] [address]   Magnesium 04/15/2024 2.3  1.7 - 2.4 mg/dL Final   Performed at [facility], [address], [place], [place] [address]   Color, Urine 04/15/2024 YELLOW  YELLOW Final   APPearance 04/15/2024 HAZY (A)  CLEAR Final   Specific Gravity, Urine 04/15/2024 1.021  1.005 - 1.030 Final   pH 04/15/2024 5.0  5.0 - 8.0 Final   Glucose, UA 04/15/2024 NEGATIVE  NEGATIVE mg/dL Final   Hgb urine dipstick 04/15/2024 NEGATIVE  NEGATIVE Final   Bilirubin Urine 04/15/2024 NEGATIVE   NEGATIVE Final   Ketones, ur 04/15/2024 NEGATIVE  NEGATIVE mg/dL Final   Protein, ur 92/85/7974 30 (A)  NEGATIVE mg/dL Final   Nitrite 92/85/7974 NEGATIVE  NEGATIVE Final   Leukocytes,Ua 04/15/2024 NEGATIVE  NEGATIVE Final   RBC / HPF 04/15/2024 0-5  0 - 5 RBC/hpf Final   WBC, UA 04/15/2024 0-5  0 - 5 WBC/hpf Final   Bacteria, UA 04/15/2024 RARE (A)  NONE SEEN Final   Squamous Epithelial / HPF 04/15/2024 0-5  0 - 5 /HPF Final   Mucus 04/15/2024 PRESENT   Final   Hyaline Casts, UA 04/15/2024 PRESENT   Final   Performed at [facility], [address], [place], [place] [address]   Alcohol, Ethyl (B) 04/15/2024 <15  <15 mg/dL Final   Comment: (NOTE) For medical purposes only. Performed at [facility], [address] [place], [place] [address]    Opiates 04/15/2024 NONE DETECTED  NONE DETECTED Final   Cocaine 04/15/2024 NONE DETECTED  NONE DETECTED Final   Benzodiazepines 04/15/2024 NONE DETECTED  NONE DETECTED Final   Amphetamines 04/15/2024 NONE DETECTED  NONE DETECTED Final   Tetrahydrocannabinol 04/15/2024 POSITIVE (A)  NONE DETECTED Final   Barbiturates 04/15/2024 NONE DETECTED  NONE DETECTED Final   Comment: (NOTE) DRUG SCREEN FOR MEDICAL PURPOSES ONLY.  IF CONFIRMATION IS NEEDED FOR ANY PURPOSE, NOTIFY LAB WITHIN 5 DAYS.  LOWEST DETECTABLE LIMITS FOR URINE DRUG SCREEN Drug Class                     Cutoff (ng/mL) Amphetamine and metabolites    1000 Barbiturate and metabolites    200 Benzodiazepine                 200 Opiates and metabolites  300 Cocaine and metabolites        300 THC                            50 Performed at [facility], [address] [place], [place] [address]    Phosphorus 04/15/2024 2.8  2.5 - 4.6 mg/dL Final   Performed at [facility], [address] [place], [place] [address]  Admission on 03/02/2024, Discharged on 03/03/2024  Component Date Value Ref Range Status   Sodium 03/02/2024 138  135 - 145 mmol/L  Final   Potassium 03/02/2024 4.2  3.5 - 5.1 mmol/L Final   Chloride 03/02/2024 104  98 - 111 mmol/L Final   CO2 03/02/2024 29  22 - 32 mmol/L Final   Glucose, Bld 03/02/2024 99  70 - 99 mg/dL Final   Glucose reference range applies only to samples taken after fasting for at least 8 hours.   BUN 03/02/2024 23 (H)  6 - 20 mg/dL Final   Creatinine, Ser 03/02/2024 1.05  0.61 - 1.24 mg/dL Final   Calcium 94/68/7974 9.0  8.9 - 10.3 mg/dL Final   Total Protein 94/68/7974 6.9  6.5 - 8.1 g/dL Final   Albumin 94/68/7974 3.8  3.5 - 5.0 g/dL Final   AST 94/68/7974 47 (H)  15 - 41 U/L Final   ALT 03/02/2024 38  0 - 44 U/L Final   Alkaline Phosphatase 03/02/2024 80  38 - 126 U/L Final   Total Bilirubin 03/02/2024 0.8  0.0 - 1.2 mg/dL Final   GFR, Estimated 03/02/2024 >60  >60 mL/min Final   Comment: (NOTE) Calculated using the CKD-EPI Creatinine Equation (2021)    Anion gap 03/02/2024 5  5 - 15 Final   Performed at [facility], [address] [place], [place] [address]   Alcohol, Ethyl (B) 03/02/2024 <15  <15 mg/dL Final   Comment: (NOTE) For medical purposes only. Performed at [facility], [address] [place], [place] [address]    Tricyclic, Ur Screen 03/02/2024 NONE DETECTED  NONE DETECTED Final   Amphetamines, Ur Screen 03/02/2024 POSITIVE (A)  NONE DETECTED Final   Comment: (NOTE) Trazodone is metabolized in vivo to several metabolites, including pharmacologically active m-CPP, which is excreted in the urine. Immunoassay screens for amphetamines and MDMA have potential cross-reactivity with these compounds and may provide false positive  results.     MDMA (Ecstasy)Ur Screen 03/02/2024 NONE DETECTED  NONE DETECTED Final   Cocaine Metabolite,Ur [place] 03/02/2024 NONE DETECTED  NONE DETECTED Final   Opiate, Ur Screen 03/02/2024 NONE DETECTED  NONE DETECTED Final   Phencyclidine (PCP) Ur S 03/02/2024 NONE DETECTED  NONE DETECTED Final   Cannabinoid 50 Ng, Ur [place]  03/02/2024 POSITIVE (A)  NONE DETECTED Final   Barbiturates, Ur Screen 03/02/2024 NONE DETECTED  NONE DETECTED Final   Benzodiazepine, Ur Scrn 03/02/2024 NONE DETECTED  NONE DETECTED Final   Methadone Scn, Ur 03/02/2024 NONE DETECTED  NONE DETECTED Final   Comment: (NOTE) Tricyclics + metabolites, urine    Cutoff 1000 ng/mL Amphetamines + metabolites, urine  Cutoff 1000 ng/mL MDMA (Ecstasy), urine              Cutoff 500 ng/mL Cocaine Metabolite, urine          Cutoff 300 ng/mL Opiate + metabolites, urine        Cutoff 300 ng/mL Phencyclidine (PCP), urine  Cutoff 25 ng/mL Cannabinoid, urine                 Cutoff 50 ng/mL Barbiturates + metabolites, urine  Cutoff 200 ng/mL Benzodiazepine, urine              Cutoff 200 ng/mL Methadone, urine                   Cutoff 300 ng/mL  The urine drug screen provides only a preliminary, unconfirmed analytical test result and should not be used for non-medical purposes. Clinical consideration and professional judgment should be applied to any positive drug screen result due to possible interfering substances. A more specific alternate chemical method must be used in order to obtain a confirmed analytical result. Gas chromatography / mass spectrometry (GC/MS) is the preferred confirm                          atory method. Performed at [facility], [address] [place], [place] [address]    WBC 03/02/2024 8.0  4.0 - 10.5 K/uL Final   RBC 03/02/2024 4.77  4.22 - 5.81 MIL/uL Final   Hemoglobin 03/02/2024 13.9  13.0 - 17.0 g/dL Final   HCT 94/68/7974 41.6  39.0 - 52.0 % Final   MCV 03/02/2024 87.2  80.0 - 100.0 fL Final   MCH 03/02/2024 29.1  26.0 - 34.0 pg Final   MCHC 03/02/2024 33.4  30.0 - 36.0 g/dL Final   RDW 94/68/7974 13.4  11.5 - 15.5 % Final   Platelets 03/02/2024 238  150 - 400 K/uL Final   nRBC 03/02/2024 0.0  0.0 - 0.2 % Final   Neutrophils Relative % 03/02/2024 54  % Final   Neutro Abs 03/02/2024 4.4  1.7 -  7.7 K/uL Final   Lymphocytes Relative 03/02/2024 29  % Final   Lymphs Abs 03/02/2024 2.3  0.7 - 4.0 K/uL Final   Monocytes Relative 03/02/2024 10  % Final   Monocytes Absolute 03/02/2024 0.8  0.1 - 1.0 K/uL Final   Eosinophils Relative 03/02/2024 5  % Final   Eosinophils Absolute 03/02/2024 0.4  0.0 - 0.5 K/uL Final   Basophils Relative 03/02/2024 1  % Final   Basophils Absolute 03/02/2024 0.0  0.0 - 0.1 K/uL Final   Immature Granulocytes 03/02/2024 1  % Final   Abs Immature Granulocytes 03/02/2024 0.04  0.00 - 0.07 K/uL Final   Performed at [facility], [address], [place], [place] [address]   Specimen Description 03/02/2024    Final                   Value:ABSCESS Performed at [facility], [address] [place], [place] [address]    Special Requests 03/02/2024    Final                   Value:NONE Performed at [facility], [address] [place], [place] [address]    Gram Stain 03/02/2024    Final                   Value:RARE WBC SEEN NO ORGANISMS SEEN Performed at [facility], [address], [place], [place] [address]    Culture 03/02/2024 RARE METHICILLIN RESISTANT STAPHYLOCOCCUS AUREUS   Final   Report Status 03/02/2024 03/04/2024 FINAL   Final   Organism ID, Bacteria 03/02/2024 METHICILLIN RESISTANT STAPHYLOCOCCUS AUREUS   Final   Lithium   Lvl 03/02/2024 <0.06 (L)  0.60 - 1.20 mmol/L Final   Performed at [facility], [address] [place], [place] [address]    Allergies: Patient has no known allergies.  Medications:  PTA Medications  Medication Sig   SUBOXONE  8-2 MG FILM Place one strip under the tongue twice daily in the morning and evening and one-half strip at midday as directed.   lithium  carbonate (ESKALITH ) 450 MG CR tablet Take 1 tablet (450 mg total) by mouth at bedtime.   lurasidone  (LATUDA ) 20 MG TABS tablet Take 20 mg by mouth daily with breakfast.   clonazePAM  (KLONOPIN ) 1 MG tablet Take 1 tablet (1  mg total) by mouth 2 (two) times daily.   amLODipine  (NORVASC ) 5 MG tablet Take 5 mg by mouth daily.   ketoconazole (NIZORAL) 2 % cream Apply a small amount, 1-2 g, to affected area on feet twice a day for 2 to 4 weeks.   cephALEXin  (KEFLEX ) 500 MG capsule Take 1 capsule (500 mg total) by mouth 3 (three) times daily.   naproxen  (NAPROSYN ) 500 MG tablet Take 1 tablet (500 mg total) by mouth 2 (two) times daily with a meal.   Facility Ordered Medications  Medication   [COMPLETED] LORazepam  (ATIVAN ) tablet 1 mg   [COMPLETED] morphine  (PF) 4 MG/ML injection 4 mg      Medical Decision Making  Observation    Recommendations  Based on my evaluation the patient does not appear to have an emergency medical condition.  Gaither Pouch, NP 04/16/24  4:51 AM

## 2024-04-15 NOTE — ED Notes (Signed)
 [facility] called pt on moniter

## 2024-04-15 NOTE — ED Triage Notes (Signed)
 Pt arrived via [facility] from the [facility] [address]. The pt was eating breakfast at the diner there and reportedly stood up and fell over then got up and then fell again. Bystanders report that the pt was demonstrating seizure like activity after falling the second time. Pt has been unhoused and not taking his medications for 3 months. Pt has a known hx of ETOH abuse. Pt has a 1 inch gash across his forehead, and a nosebleed that are both controlled. Pt reports shoulder pain and is requesting medication for the pain.   EMS reports the pt was aggressive, yelling, and uncooperative with EMS.  Pt has been calm since arriving to H21 bed.  EMS Vitals 144/90 60 HR

## 2024-04-15 NOTE — Progress Notes (Signed)
   04/15/24 2041  [facility] Triage Screening (Walk-ins at [facility] only)  How Did You Hear About Us ? DSS  What Is the Reason for Your Visit/Call Today? Pt [place] accompanied by GPD and BHRT due to mental health concerns. He was picked up from the [facility] [place] acting irractic. He states that he has not had his medications in about 3 months and that his symptoms have worsened. He is diagnosed with Bipolar disorder. He denies SI, HI, AVH, and admits to drinking two beers earlier today he is listed as routine.  How Long Has This Been Causing You Problems? 1-6 months  Have You Recently Had Any Thoughts About Hurting Yourself? No  Are You Planning to Commit Suicide/Harm Yourself At This time? No  Have you Recently Had Thoughts About Hurting Someone Sherral? No  Are You Planning To Harm Someone At This Time? No  Physical Abuse Denies  Verbal Abuse Denies  Sexual Abuse Denies  Exploitation of patient/patient's resources Denies  Self-Neglect Denies  Are you currently experiencing any auditory, visual or other hallucinations? No  Have You Used Any Alcohol or Drugs in the Past 24 Hours? Yes  What Did You Use and How Much? 2 beers  Do you have any current medical co-morbidities that require immediate attention? Yes  Please describe current medical co-morbidities that require immediate attention: Bone spurs, High blood pressure, and seizures  What Do You Feel Would Help You the Most Today? Medication(s);Stress Management;Social Support  If access to [facility] was not available, would you have sought care in the Emergency Department? No  Determination of Need Routine (7 days)  Options For Referral [facility];Other: Comment;Therapeutic Triage Services;Outpatient Therapy

## 2024-04-15 NOTE — ED Provider Notes (Signed)
 I provided a substantive portion of the care of this patient.  I personally made/approved the management plan for this patient and take responsibility for the patient management.  EKG Interpretation Date/Time:  Monday April 15 2024 12:01:15 EDT Ventricular Rate:  60 PR Interval:    QRS Duration:  84 QT Interval:  428 QTC Calculation: 428 R Axis:   110  Text Interpretation: Atrial flutter with variable A-V block Left posterior fascicular block T wave abnormality, consider inferior ischemia Abnormal ECG When compared with ECG of 15-Apr-2024 11:59, PREVIOUS ECG IS PRESENT Confirmed by Dasie Faden ([id number]) on 04/15/2024 2:54:15 PM   Patient is EKG from interpretation shows sinus rhythm.  Please ignore the above reading with a flutter.  Patient here after fall with some altered mental status.  Workup here including CT of head and cervical spine are without acute abnormality.  Had a laceration which was repaired.  He is able to ambulate.  He is alert and oriented x 4.  Will be discharged home   Dasie Faden, MD 04/15/24 [phone]

## 2024-04-15 NOTE — ED Provider Notes (Signed)
  EMERGENCY DEPARTMENT AT [facility] Provider Note   CSN: [id number] Arrival date & time: 04/15/24  9081     Patient presents with: Ernest Romero is a 58 y.o. male who was brought in by EMS after being found with altered LOC and reported previous falls and seizure activity.  He was at a [employer] when bystanders observed him stumble and fall multiple times, per EMS patient is supposed to take seizure medications however has not been able to take these for several years.  According to EMS he was uncooperative initially but upon arrival to the emergency department has been cooperative with nursing staff thus far.  Review of his chart shows significant previous medical diagnoses of polysubstance abuse, hypertension.  Previously evaluated by psychiatry on 12 July however discontinued his evaluation and signed out AMA.  He was also seen on 12 July in this ED for a left forearm abscess which was incised and drained at the time.  Multiple admissions this year so far for multiple drug and alcohol related complaints.    Fall       Prior to Admission medications   Medication Sig Start Date End Date Taking? Authorizing Provider  amLODipine  (NORVASC ) 5 MG tablet Take 5 mg by mouth daily. 11/28/23   [provider]  cephALEXin  (KEFLEX ) 500 MG capsule Take 1 capsule (500 mg total) by mouth 3 (three) times daily. 03/18/24   Saunders Shona CROME, PA-C  clonazePAM  (KLONOPIN ) 1 MG tablet Take 1 tablet (1 mg total) by mouth 2 (two) times daily. 02/25/24   Cleaster Tinnie LABOR, PA-C  ketoconazole (NIZORAL) 2 % cream Apply a small amount, 1-2 g, to affected area on feet twice a day for 2 to 4 weeks. 11/28/23   [provider]  lithium  carbonate (ESKALITH ) 450 MG CR tablet Take 1 tablet (450 mg total) by mouth at bedtime. 06/01/16   Sainani, Vivek J, MD  lurasidone  (LATUDA ) 20 MG TABS tablet Take 20 mg by mouth daily with breakfast.    [provider]  naproxen  (NAPROSYN ) 500 MG tablet Take 1 tablet (500 mg total) by mouth 2 (two) times daily with a meal. 03/18/24   Saunders Shona CROME, PA-C  SUBOXONE  8-2 MG FILM Place one strip under the tongue twice daily in the morning and evening and one-half strip at midday as directed.    [provider]    Allergies: Patient has no known allergies.    Review of Systems  Skin:  Positive for wound.  Neurological:  Positive for weakness.  Psychiatric/Behavioral:  Positive for confusion.   All other systems reviewed and are negative.   Updated Vital Signs BP (!) 155/106   Pulse (!) 56   Temp 97.8 F (36.6 C) (Oral)   Resp 16   SpO2 100%   Physical Exam Vitals and nursing note reviewed.  Constitutional:      General: He is not in acute distress.    Appearance: Normal appearance.  HENT:     Head: Normocephalic. Laceration present.      Comments: Laceration noted to the scalp.    Mouth/Throat:     Mouth: Mucous membranes are moist.     Pharynx: Oropharynx is clear.  Eyes:     General: Lids are normal. Vision grossly intact. Gaze aligned appropriately.     Extraocular Movements: Extraocular movements intact.     Conjunctiva/sclera: Conjunctivae normal.     Pupils: Pupils are equal, round, and  reactive to light.  Cardiovascular:     Rate and Rhythm: Normal rate and regular rhythm.     Pulses: Normal pulses.     Heart sounds: Normal heart sounds. No murmur heard.    No friction rub. No gallop.  Pulmonary:     Effort: Pulmonary effort is normal.     Breath sounds: Normal breath sounds.  Abdominal:     General: Abdomen is flat. Bowel sounds are normal.     Palpations: Abdomen is soft.  Musculoskeletal:        General: Normal range of motion.     Cervical back: Normal range of motion and neck supple. No spinous process tenderness or muscular tenderness.     Right lower leg: No edema.     Left lower leg: No edema.  Skin:    General: Skin is warm and dry.     Capillary  Refill: Capillary refill takes less than 2 seconds.  Neurological:     General: No focal deficit present.     Mental Status: He is oriented to person, place, and time. Mental status is at baseline. He is lethargic.     GCS: GCS eye subscore is 4. GCS verbal subscore is 5. GCS motor subscore is 6.     Cranial Nerves: No cranial nerve deficit or facial asymmetry.     Motor: Motor function is intact.  Psychiatric:        Mood and Affect: Mood normal.        Behavior: Behavior is cooperative.     (all labs ordered are listed, but only abnormal results are displayed) Labs Reviewed  COMPREHENSIVE METABOLIC PANEL WITH GFR - Abnormal; Notable for the following components:      Result Value   Glucose, Bld 134 (*)    Calcium 8.2 (*)    Total Protein 6.0 (*)    Albumin 3.3 (*)    All other components within normal limits  CBC WITH DIFFERENTIAL/PLATELET - Abnormal; Notable for the following components:   Hemoglobin 12.7 (*)    HCT 38.8 (*)    All other components within normal limits  URINALYSIS, ROUTINE W REFLEX MICROSCOPIC - Abnormal; Notable for the following components:   APPearance HAZY (*)    Protein, ur 30 (*)    Bacteria, UA RARE (*)    All other components within normal limits  RAPID URINE DRUG SCREEN, HOSP PERFORMED - Abnormal; Notable for the following components:   Tetrahydrocannabinol POSITIVE (*)    All other components within normal limits  CBG MONITORING, ED - Abnormal; Notable for the following components:   Glucose-Capillary 127 (*)    All other components within normal limits  MAGNESIUM  ETHANOL  PHOSPHORUS  AMMONIA    EKG: EKG Interpretation Date/Time:  Monday April 15 2024 12:01:15 EDT Ventricular Rate:  60 PR Interval:    QRS Duration:  84 QT Interval:  428 QTC Calculation: 428 R Axis:   110  Text Interpretation: Atrial flutter with variable A-V block Left posterior fascicular block T wave abnormality, consider inferior ischemia Abnormal ECG When compared  with ECG of 15-Apr-2024 11:59, PREVIOUS ECG IS PRESENT Confirmed by Dasie Faden ([id number]) on 04/15/2024 2:54:15 PM  Radiology: CT HEAD WO CONTRAST Result Date: 04/15/2024 CLINICAL DATA:  Fall seizure-like activity, facial injury EXAM: CT HEAD WITHOUT CONTRAST CT MAXILLOFACIAL WITHOUT CONTRAST TECHNIQUE: Multidetector CT imaging of the head and maxillofacial structures were performed using the standard protocol without intravenous contrast. Multiplanar CT image reconstructions of the maxillofacial  structures were also generated. RADIATION DOSE REDUCTION: This exam was performed according to the departmental dose-optimization program which includes automated exposure control, adjustment of the mA and/or kV according to patient size and/or use of iterative reconstruction technique. COMPARISON:  02/25/2024 FINDINGS: CT HEAD FINDINGS Brain: No evidence of acute infarction, hemorrhage, hydrocephalus, extra-axial collection or mass lesion/mass effect. Vascular: No hyperdense vessel or unexpected calcification. CT FACIAL BONES FINDINGS Skull: Normal. Negative for fracture or focal lesion. Facial bones: No acute displaced fractures or dislocations. Redemonstrated tripod morphology fractures of the left maxillary sinus and left orbital floor, which are not significantly changed in appearance or alignment when compared to examination dated 02/25/2024. The zygoma is intact. Sinuses/Orbits: Orbital contents are grossly intact by noncontrast CT. Mucosal thickening and air-fluid level within the left maxillary sinus increased compared to prior examination. Other: None. IMPRESSION: 1. No acute intracranial pathology. 2. No acute displaced fractures or dislocations of the facial bones. 3. Redemonstrated tripod morphology fractures of the left maxillary sinus and left orbital floor, which are not significantly changed in appearance or alignment when compared to examination dated 02/25/2024. 4. Mucosal thickening and air-fluid  level within the left maxillary sinus increased compared to prior examination. Electronically Signed   By: Marolyn JONETTA Jaksch M.D.   On: 04/15/2024 11:44   CT Maxillofacial WO CM Result Date: 04/15/2024 CLINICAL DATA:  Fall seizure-like activity, facial injury EXAM: CT HEAD WITHOUT CONTRAST CT MAXILLOFACIAL WITHOUT CONTRAST TECHNIQUE: Multidetector CT imaging of the head and maxillofacial structures were performed using the standard protocol without intravenous contrast. Multiplanar CT image reconstructions of the maxillofacial structures were also generated. RADIATION DOSE REDUCTION: This exam was performed according to the departmental dose-optimization program which includes automated exposure control, adjustment of the mA and/or kV according to patient size and/or use of iterative reconstruction technique. COMPARISON:  02/25/2024 FINDINGS: CT HEAD FINDINGS Brain: No evidence of acute infarction, hemorrhage, hydrocephalus, extra-axial collection or mass lesion/mass effect. Vascular: No hyperdense vessel or unexpected calcification. CT FACIAL BONES FINDINGS Skull: Normal. Negative for fracture or focal lesion. Facial bones: No acute displaced fractures or dislocations. Redemonstrated tripod morphology fractures of the left maxillary sinus and left orbital floor, which are not significantly changed in appearance or alignment when compared to examination dated 02/25/2024. The zygoma is intact. Sinuses/Orbits: Orbital contents are grossly intact by noncontrast CT. Mucosal thickening and air-fluid level within the left maxillary sinus increased compared to prior examination. Other: None. IMPRESSION: 1. No acute intracranial pathology. 2. No acute displaced fractures or dislocations of the facial bones. 3. Redemonstrated tripod morphology fractures of the left maxillary sinus and left orbital floor, which are not significantly changed in appearance or alignment when compared to examination dated 02/25/2024. 4. Mucosal  thickening and air-fluid level within the left maxillary sinus increased compared to prior examination. Electronically Signed   By: Marolyn JONETTA Jaksch M.D.   On: 04/15/2024 11:44   CT CERVICAL SPINE WO CONTRAST Result Date: 04/15/2024 CLINICAL DATA:  Neck trauma.  Fall with possible seizure. EXAM: CT CERVICAL SPINE WITHOUT CONTRAST TECHNIQUE: Multidetector CT imaging of the cervical spine was performed without intravenous contrast. Multiplanar CT image reconstructions were also generated. RADIATION DOSE REDUCTION: This exam was performed according to the departmental dose-optimization program which includes automated exposure control, adjustment of the mA and/or kV according to patient size and/or use of iterative reconstruction technique. COMPARISON:  CT cervical spine 02/25/2024. FINDINGS: Alignment: Stable and physiologic. Skull base and vertebrae: No evidence of acute cervical spine fracture or traumatic subluxation. Soft  tissues and spinal canal: No prevertebral fluid or swelling. No visible canal hematoma. Disc levels: Similar multilevel spondylosis with disc space narrowing and uncinate spurring greatest at C5-6 and C6-7. Multilevel foraminal narrowing, greatest on the left at C5-6. No large disc herniation or gross changes identified. Upper chest: Clear lung apices. Other: None. IMPRESSION: 1. No evidence of acute cervical spine fracture, traumatic subluxation or static signs of instability. 2. Similar multilevel cervical spondylosis to recent prior CT. Electronically Signed   By: Elsie Perone M.D.   On: 04/15/2024 11:02     .Laceration Repair  Date/Time: 04/15/2024 1:53 PM  Performed by: Myriam Dorn BROCKS, PA Authorized by: Myriam Dorn BROCKS, PA   Consent:    Consent obtained:  Verbal   Consent given by:  Patient   Risks, benefits, and alternatives were discussed: yes     Risks discussed:  Infection, pain, need for additional repair, nerve damage, poor cosmetic result and poor wound  healing   Alternatives discussed:  No treatment, delayed treatment, observation and referral Universal protocol:    Procedure explained and questions answered to patient or proxy's satisfaction: yes     Patient identity confirmed:  Verbally with patient, arm band and hospital-assigned identification number Anesthesia:    Anesthesia method:  None Laceration details:    Location:  Scalp   Scalp location:  Frontal   Length (cm):  3   Depth (mm):  1 Exploration:    Limited defect created (wound extended): no     Hemostasis achieved with:  Direct pressure   Wound exploration: wound explored through full range of motion and entire depth of wound visualized     Contaminated: no   Treatment:    Area cleansed with:  Shur-Clens   Amount of cleaning:  Standard   Irrigation solution:  Sterile saline   Irrigation volume:  150   Irrigation method:  Syringe   Visualized foreign bodies/material removed: no     Debridement:  None   Undermining:  None   Scar revision: no   Skin repair:    Repair method:  Tissue adhesive Approximation:    Approximation:  Close Repair type:    Repair type:  Simple Post-procedure details:    Dressing:  Open (no dressing)   Procedure completion:  Tolerated well, no immediate complications    Medications Ordered in the ED  LORazepam  (ATIVAN ) tablet 1 mg (1 mg Oral Given 04/15/24 1017)  morphine  (PF) 4 MG/ML injection 4 mg (4 mg Subcutaneous Given 04/15/24 1017)                                    Medical Decision Making Amount and/or Complexity of Data Reviewed Labs: ordered. Radiology: ordered.  Risk Prescription drug management.   Medical Decision Making:   SIRAJ DERMODY is a 58 y.o. male who presented to the ED today with laceration and altered LOC detailed above.     Complete initial physical exam performed, notably the patient  was initially lethargic and while oriented was impaired.    Reviewed and confirmed nursing documentation for past  medical history, family history, social history.    Initial Assessment:   With the patient's presentation of head laceration and altered level of consciousness, most likely diagnosis is acute intoxication and or sequelae of chronic disease.  For the differential includes acute traumatic head injury, and alcoholic encephalopathy.  Initial Plan:  Obtain CT imaging  of the head, neck, and maxillofacial structures Screening labs including CBC and Metabolic panel to evaluate for infectious or metabolic etiology of disease.  Obtain ethanol level along with UDS. Urinalysis with reflex culture ordered to evaluate for UTI or relevant urologic/nephrologic pathology.  EKG to evaluate for cardiac pathology Objective evaluation as below reviewed   Initial Study Results:   Laboratory  All laboratory results reviewed without evidence of clinically relevant pathology.   Exceptions include: UDS is positive for THC, otherwise labs unremarkable.  EKG EKG was reviewed independently. Rate, rhythm, axis, intervals all examined and without medically relevant abnormality. ST segments without concerns for elevations.    Radiology:  All images reviewed independently. Agree with radiology report at this time.   CT HEAD WO CONTRAST Result Date: 04/15/2024 CLINICAL DATA:  Fall seizure-like activity, facial injury EXAM: CT HEAD WITHOUT CONTRAST CT MAXILLOFACIAL WITHOUT CONTRAST TECHNIQUE: Multidetector CT imaging of the head and maxillofacial structures were performed using the standard protocol without intravenous contrast. Multiplanar CT image reconstructions of the maxillofacial structures were also generated. RADIATION DOSE REDUCTION: This exam was performed according to the departmental dose-optimization program which includes automated exposure control, adjustment of the mA and/or kV according to patient size and/or use of iterative reconstruction technique. COMPARISON:  02/25/2024 FINDINGS: CT HEAD FINDINGS Brain:  No evidence of acute infarction, hemorrhage, hydrocephalus, extra-axial collection or mass lesion/mass effect. Vascular: No hyperdense vessel or unexpected calcification. CT FACIAL BONES FINDINGS Skull: Normal. Negative for fracture or focal lesion. Facial bones: No acute displaced fractures or dislocations. Redemonstrated tripod morphology fractures of the left maxillary sinus and left orbital floor, which are not significantly changed in appearance or alignment when compared to examination dated 02/25/2024. The zygoma is intact. Sinuses/Orbits: Orbital contents are grossly intact by noncontrast CT. Mucosal thickening and air-fluid level within the left maxillary sinus increased compared to prior examination. Other: None. IMPRESSION: 1. No acute intracranial pathology. 2. No acute displaced fractures or dislocations of the facial bones. 3. Redemonstrated tripod morphology fractures of the left maxillary sinus and left orbital floor, which are not significantly changed in appearance or alignment when compared to examination dated 02/25/2024. 4. Mucosal thickening and air-fluid level within the left maxillary sinus increased compared to prior examination. Electronically Signed   By: Marolyn JONETTA Jaksch M.D.   On: 04/15/2024 11:44   CT Maxillofacial WO CM Result Date: 04/15/2024 CLINICAL DATA:  Fall seizure-like activity, facial injury EXAM: CT HEAD WITHOUT CONTRAST CT MAXILLOFACIAL WITHOUT CONTRAST TECHNIQUE: Multidetector CT imaging of the head and maxillofacial structures were performed using the standard protocol without intravenous contrast. Multiplanar CT image reconstructions of the maxillofacial structures were also generated. RADIATION DOSE REDUCTION: This exam was performed according to the departmental dose-optimization program which includes automated exposure control, adjustment of the mA and/or kV according to patient size and/or use of iterative reconstruction technique. COMPARISON:  02/25/2024 FINDINGS:  CT HEAD FINDINGS Brain: No evidence of acute infarction, hemorrhage, hydrocephalus, extra-axial collection or mass lesion/mass effect. Vascular: No hyperdense vessel or unexpected calcification. CT FACIAL BONES FINDINGS Skull: Normal. Negative for fracture or focal lesion. Facial bones: No acute displaced fractures or dislocations. Redemonstrated tripod morphology fractures of the left maxillary sinus and left orbital floor, which are not significantly changed in appearance or alignment when compared to examination dated 02/25/2024. The zygoma is intact. Sinuses/Orbits: Orbital contents are grossly intact by noncontrast CT. Mucosal thickening and air-fluid level within the left maxillary sinus increased compared to prior examination. Other: None. IMPRESSION: 1. No acute intracranial  pathology. 2. No acute displaced fractures or dislocations of the facial bones. 3. Redemonstrated tripod morphology fractures of the left maxillary sinus and left orbital floor, which are not significantly changed in appearance or alignment when compared to examination dated 02/25/2024. 4. Mucosal thickening and air-fluid level within the left maxillary sinus increased compared to prior examination. Electronically Signed   By: Marolyn JONETTA Jaksch M.D.   On: 04/15/2024 11:44   CT CERVICAL SPINE WO CONTRAST Result Date: 04/15/2024 CLINICAL DATA:  Neck trauma.  Fall with possible seizure. EXAM: CT CERVICAL SPINE WITHOUT CONTRAST TECHNIQUE: Multidetector CT imaging of the cervical spine was performed without intravenous contrast. Multiplanar CT image reconstructions were also generated. RADIATION DOSE REDUCTION: This exam was performed according to the departmental dose-optimization program which includes automated exposure control, adjustment of the mA and/or kV according to patient size and/or use of iterative reconstruction technique. COMPARISON:  CT cervical spine 02/25/2024. FINDINGS: Alignment: Stable and physiologic. Skull base and  vertebrae: No evidence of acute cervical spine fracture or traumatic subluxation. Soft tissues and spinal canal: No prevertebral fluid or swelling. No visible canal hematoma. Disc levels: Similar multilevel spondylosis with disc space narrowing and uncinate spurring greatest at C5-6 and C6-7. Multilevel foraminal narrowing, greatest on the left at C5-6. No large disc herniation or gross changes identified. Upper chest: Clear lung apices. Other: None. IMPRESSION: 1. No evidence of acute cervical spine fracture, traumatic subluxation or static signs of instability. 2. Similar multilevel cervical spondylosis to recent prior CT. Electronically Signed   By: Elsie Perone M.D.   On: 04/15/2024 11:02        Reassessment and Plan:   Reevaluated patient, he has tolerated oral intake both solid and liquid.  He also is ambulatory without assistance.  As such, will plan to discharge patient with outpatient follow-up.  Imaging while he was here in the hospital did not reveal any acute fractures of the head, C-spine, nor the maxillofacial structures.  There is a chronic fracture of the left maxillary sinus however CT does not show any acute changes to this.         Final diagnoses:  None    ED Discharge Orders     None          Myriam Dorn BROCKS, [place] 04/15/24 1508    Dasie Faden, MD 04/16/24 [phone]

## 2024-04-21 ENCOUNTER — Other Ambulatory Visit: Payer: Self-pay

## 2024-04-21 ENCOUNTER — Emergency Department (HOSPITAL_COMMUNITY)
Admission: EM | Admit: 2024-04-21 | Discharge: 2024-04-21 | Disposition: A | Discharge status: Home / Self Care | Payer: MEDICAID | Attending: Emergency Medicine | Admitting: Emergency Medicine

## 2024-04-21 ENCOUNTER — Encounter (HOSPITAL_COMMUNITY): Payer: Self-pay | Admitting: Emergency Medicine

## 2024-04-21 DIAGNOSIS — Z765 Malingerer [conscious simulation]: Secondary | ICD-10-CM

## 2024-04-21 DIAGNOSIS — Z7289 Other problems related to lifestyle: Secondary | ICD-10-CM | POA: Diagnosis not present

## 2024-04-21 DIAGNOSIS — F919 Conduct disorder, unspecified: Secondary | ICD-10-CM | POA: Insufficient documentation

## 2024-04-21 DIAGNOSIS — F69 Unspecified disorder of adult personality and behavior: Secondary | ICD-10-CM | POA: Diagnosis not present

## 2024-04-21 DIAGNOSIS — R45850 Homicidal ideations: Secondary | ICD-10-CM | POA: Insufficient documentation

## 2024-04-21 LAB — COMPREHENSIVE METABOLIC PANEL WITH GFR
ALT: 23 U/L (ref 0–44)
AST: 33 U/L (ref 15–41)
Albumin: 3.5 g/dL (ref 3.5–5.0)
Alkaline Phosphatase: 88 U/L (ref 38–126)
Anion gap: 8 (ref 5–15)
BUN: 18 mg/dL (ref 6–20)
CO2: 23 mmol/L (ref 22–32)
Calcium: 8.6 mg/dL — ABNORMAL LOW (ref 8.9–10.3)
Chloride: 109 mmol/L (ref 98–111)
Creatinine, Ser: 1.17 mg/dL (ref 0.61–1.24)
GFR, Estimated: >60 mL/min (ref >60)
Glucose, Bld: 132 mg/dL — ABNORMAL HIGH (ref 70–99)
Potassium: 3.9 mmol/L (ref 3.5–5.1)
Sodium: 140 mmol/L (ref 135–145)
Total Bilirubin: 0.5 mg/dL (ref 0.0–1.2)
Total Protein: 6.3 g/dL — ABNORMAL LOW (ref 6.5–8.1)

## 2024-04-21 LAB — CBC
HCT: 40.5 % (ref 39.0–52.0)
Hemoglobin: 13.1 g/dL (ref 13.0–17.0)
MCH: 29.6 pg (ref 26.0–34.0)
MCHC: 32.3 g/dL (ref 30.0–36.0)
MCV: 91.6 fL (ref 80.0–100.0)
Platelets: 282 K/uL (ref 150–400)
RBC: 4.42 MIL/uL (ref 4.22–5.81)
RDW: 13.6 % (ref 11.5–15.5)
WBC: 6.2 K/uL (ref 4.0–10.5)
nRBC: 0 % (ref 0.0–0.2)

## 2024-04-21 LAB — RAPID URINE DRUG SCREEN, HOSP PERFORMED
Amphetamines: NOT DETECTED
Barbiturates: NOT DETECTED
Benzodiazepines: NOT DETECTED
Cocaine: NOT DETECTED
Opiates: NOT DETECTED
Tetrahydrocannabinol: POSITIVE — AB

## 2024-04-21 LAB — ETHANOL: Alcohol, Ethyl (B): <15 mg/dL (ref <15)

## 2024-04-21 MED ORDER — ONDANSETRON HCL 4 MG PO TABS: 4.0000 mg | ORAL_TABLET | Freq: Three times a day (TID) | ORAL | Status: DC | PRN

## 2024-04-21 MED ORDER — ACETAMINOPHEN 325 MG PO TABS: 650.0000 mg | ORAL_TABLET | ORAL | Status: DC | PRN

## 2024-04-21 NOTE — ED Notes (Signed)
 Pts belongings placed in locker #6 in purple zone.

## 2024-04-21 NOTE — ED Notes (Signed)
 Pt cahnged into scrubs and all belongings bagged at this time.

## 2024-04-21 NOTE — ED Triage Notes (Addendum)
 PT brought in by [employer] with reports of homicidal ideation. PT was [place] the police [place] hollering outside. Pt denies HI at this time.

## 2024-04-21 NOTE — Discharge Instructions (Signed)
 Recommend consider close outpatient follow-up with your current outpatient psychiatrist at [facility] behavioral care or the [facility] behavioral health outpatient center Recommend consider continuing to obtain from illicit substances and EtOH Recommend safety return precautions
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
## 2024-04-21 NOTE — ED Notes (Signed)
 Patient belongings from locker and security office given back to him.

## 2024-04-22 ENCOUNTER — Encounter (HOSPITAL_COMMUNITY): Payer: Self-pay

## 2024-04-22 ENCOUNTER — Other Ambulatory Visit: Payer: Self-pay

## 2024-04-22 ENCOUNTER — Emergency Department (HOSPITAL_COMMUNITY)
Admission: EM | Admit: 2024-04-22 | Discharge: 2024-04-22 | Discharge status: Home / Self Care | Payer: MEDICAID | Attending: Emergency Medicine | Admitting: Emergency Medicine

## 2024-04-22 DIAGNOSIS — R456 Violent behavior: Secondary | ICD-10-CM | POA: Diagnosis not present

## 2024-04-22 DIAGNOSIS — R451 Restlessness and agitation: Secondary | ICD-10-CM | POA: Insufficient documentation

## 2024-04-22 DIAGNOSIS — Y92481 Parking lot as the place of occurrence of the external cause: Secondary | ICD-10-CM | POA: Diagnosis not present

## 2024-04-22 DIAGNOSIS — S0181XA Laceration without foreign body of other part of head, initial encounter: Secondary | ICD-10-CM | POA: Diagnosis not present

## 2024-04-22 DIAGNOSIS — S0993XA Unspecified injury of face, initial encounter: Secondary | ICD-10-CM | POA: Diagnosis present

## 2024-04-22 MED ORDER — HALOPERIDOL LACTATE 5 MG/ML IJ SOLN
10.0000 mg | Freq: Once | INTRAMUSCULAR | Status: DC
  Filled 2024-04-22: qty 2

## 2024-04-22 NOTE — ED Notes (Signed)
 Pt refused to have anything.
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
## 2024-06-16 ENCOUNTER — Emergency Department (HOSPITAL_COMMUNITY): Payer: MEDICAID

## 2024-06-16 ENCOUNTER — Encounter (HOSPITAL_COMMUNITY): Payer: Self-pay

## 2024-06-16 ENCOUNTER — Telehealth (HOSPITAL_COMMUNITY): Payer: Self-pay | Admitting: Emergency Medicine

## 2024-06-16 ENCOUNTER — Emergency Department (HOSPITAL_COMMUNITY)
Admission: EM | Admit: 2024-06-16 | Discharge: 2024-06-16 | Disposition: A | Discharge status: Home / Self Care | Payer: MEDICAID | Attending: Emergency Medicine | Admitting: Emergency Medicine

## 2024-06-16 DIAGNOSIS — Z79899 Other long term (current) drug therapy: Secondary | ICD-10-CM | POA: Diagnosis not present

## 2024-06-16 DIAGNOSIS — S065XAA Traumatic subdural hemorrhage with loss of consciousness status unknown, initial encounter: Secondary | ICD-10-CM | POA: Diagnosis not present

## 2024-06-16 DIAGNOSIS — S0180XA Unspecified open wound of other part of head, initial encounter: Secondary | ICD-10-CM | POA: Insufficient documentation

## 2024-06-16 DIAGNOSIS — F10920 Alcohol use, unspecified with intoxication, uncomplicated: Secondary | ICD-10-CM | POA: Diagnosis not present

## 2024-06-16 DIAGNOSIS — S01511A Laceration without foreign body of lip, initial encounter: Secondary | ICD-10-CM | POA: Diagnosis not present

## 2024-06-16 DIAGNOSIS — Z23 Encounter for immunization: Secondary | ICD-10-CM | POA: Insufficient documentation

## 2024-06-16 DIAGNOSIS — S0990XA Unspecified injury of head, initial encounter: Secondary | ICD-10-CM | POA: Diagnosis present

## 2024-06-16 LAB — URINE DRUG SCREEN
Amphetamines: NEGATIVE
Barbiturates: NEGATIVE
Benzodiazepines: NEGATIVE
Cocaine: POSITIVE — AB
Fentanyl: NEGATIVE
Methadone Scn, Ur: NEGATIVE
Opiates: NEGATIVE
Tetrahydrocannabinol: POSITIVE — AB

## 2024-06-16 MED ORDER — LIDOCAINE HCL (PF) 1 % IJ SOLN
30.0000 mL | Freq: Once | INTRAMUSCULAR | Status: AC
  Administered 2024-06-16: 30 mL
  Filled 2024-06-16: qty 30

## 2024-06-16 MED ORDER — LORAZEPAM 1 MG PO TABS
2.0000 mg | ORAL_TABLET | Freq: Once | ORAL | Status: AC
  Administered 2024-06-16: 2 mg via ORAL
  Filled 2024-06-16: qty 2

## 2024-06-16 MED ORDER — CLONAZEPAM 0.5 MG PO TABS
1.0000 mg | ORAL_TABLET | Freq: Once | ORAL | Status: AC
  Administered 2024-06-16: 1 mg via ORAL
  Filled 2024-06-16: qty 2

## 2024-06-16 MED ORDER — CHLORDIAZEPOXIDE HCL 25 MG PO CAPS: ORAL_CAPSULE | ORAL | 0 refills | Status: DC

## 2024-06-16 MED ORDER — LORAZEPAM 1 MG PO TABS
1.0000 mg | ORAL_TABLET | Freq: Once | ORAL | Status: AC
  Administered 2024-06-16: 1 mg via ORAL
  Filled 2024-06-16: qty 1

## 2024-06-16 MED ORDER — TETRACAINE HCL 0.5 % OP SOLN
1.0000 [drp] | Freq: Once | OPHTHALMIC | Status: AC
  Administered 2024-06-16: 1 [drp] via OPHTHALMIC
  Filled 2024-06-16: qty 4

## 2024-06-16 MED ORDER — TETANUS-DIPHTH-ACELL PERTUSSIS 5-2.5-18.5 LF-MCG/0.5 IM SUSY
0.5000 mL | PREFILLED_SYRINGE | Freq: Once | INTRAMUSCULAR | Status: AC
  Administered 2024-06-16: 0.5 mL via INTRAMUSCULAR
  Filled 2024-06-16: qty 0.5
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
## 2024-06-16 NOTE — Telephone Encounter (Signed)
 Patient's Librium  prescription was sent to a [facility] that is closed.  He is requesting it sent to a different [facility] on [address].  This has been electronically prescribed.

## 2024-06-16 NOTE — ED Notes (Signed)
 Attempted to complete full neurovascular assessment post CT reads. Pt using profound language and demanding this RN to leave him alone.
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
## 2024-06-16 NOTE — ED Notes (Signed)
 Ativan  1mg  handed to Deborha H EMT-P. Pt would not cooperative with this RN to take.
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
## 2024-06-16 NOTE — ED Notes (Signed)
 Patient walked to the bathroom unassisted and provided a urine sample   he went back to his room and is resting

## 2024-06-16 NOTE — ED Notes (Signed)
 Assisted patient to restroom and collected urine sample, sent to lab. Patient requested drink and sandwich, was given both.

## 2024-06-22 ENCOUNTER — Other Ambulatory Visit: Payer: Self-pay

## 2024-06-22 ENCOUNTER — Emergency Department (HOSPITAL_COMMUNITY): Payer: MEDICAID

## 2024-06-22 ENCOUNTER — Emergency Department (HOSPITAL_COMMUNITY)
Admission: EM | Admit: 2024-06-22 | Discharge: 2024-06-22 | Disposition: A | Discharge status: Home / Self Care | Payer: MEDICAID | Attending: Emergency Medicine | Admitting: Emergency Medicine

## 2024-06-22 DIAGNOSIS — S0083XA Contusion of other part of head, initial encounter: Secondary | ICD-10-CM | POA: Diagnosis not present

## 2024-06-22 DIAGNOSIS — F10120 Alcohol abuse with intoxication, uncomplicated: Secondary | ICD-10-CM | POA: Diagnosis present

## 2024-06-22 DIAGNOSIS — F10920 Alcohol use, unspecified with intoxication, uncomplicated: Secondary | ICD-10-CM

## 2024-06-22 DIAGNOSIS — Y906 Blood alcohol level of 120-199 mg/100 ml: Secondary | ICD-10-CM | POA: Diagnosis not present

## 2024-06-22 DIAGNOSIS — X58XXXA Exposure to other specified factors, initial encounter: Secondary | ICD-10-CM | POA: Diagnosis not present

## 2024-06-22 LAB — CBC WITH DIFFERENTIAL/PLATELET
Abs Immature Granulocytes: 0 K/uL (ref 0.00–0.07)
Basophils Absolute: 0 K/uL (ref 0.0–0.1)
Basophils Relative: 1 %
Eosinophils Absolute: 0.3 K/uL (ref 0.0–0.5)
Eosinophils Relative: 4 %
HCT: 42.6 % (ref 39.0–52.0)
Hemoglobin: 13.6 g/dL (ref 13.0–17.0)
Immature Granulocytes: 0 %
Lymphocytes Relative: 44 %
Lymphs Abs: 2.5 K/uL (ref 0.7–4.0)
MCH: 28.9 pg (ref 26.0–34.0)
MCHC: 31.9 g/dL (ref 30.0–36.0)
MCV: 90.4 fL (ref 80.0–100.0)
Monocytes Absolute: 0.5 K/uL (ref 0.1–1.0)
Monocytes Relative: 8 %
Neutro Abs: 2.4 K/uL (ref 1.7–7.7)
Neutrophils Relative %: 43 %
Platelets: 300 K/uL (ref 150–400)
RBC: 4.71 MIL/uL (ref 4.22–5.81)
RDW: 13.6 % (ref 11.5–15.5)
WBC: 5.7 K/uL (ref 4.0–10.5)
nRBC: 0 % (ref 0.0–0.2)

## 2024-06-22 LAB — COMPREHENSIVE METABOLIC PANEL WITH GFR
ALT: 27 U/L (ref 0–44)
AST: 34 U/L (ref 15–41)
Albumin: 4.4 g/dL (ref 3.5–5.0)
Alkaline Phosphatase: 94 U/L (ref 38–126)
Anion gap: 15 (ref 5–15)
BUN: 16 mg/dL (ref 6–20)
CO2: 21 mmol/L — ABNORMAL LOW (ref 22–32)
Calcium: 9.3 mg/dL (ref 8.9–10.3)
Chloride: 108 mmol/L (ref 98–111)
Creatinine, Ser: 1.37 mg/dL — ABNORMAL HIGH (ref 0.61–1.24)
GFR, Estimated: 59 mL/min — ABNORMAL LOW (ref >60)
Glucose, Bld: 100 mg/dL — ABNORMAL HIGH (ref 70–99)
Potassium: 4 mmol/L (ref 3.5–5.1)
Sodium: 144 mmol/L (ref 135–145)
Total Bilirubin: 0.6 mg/dL (ref 0.0–1.2)
Total Protein: 6.8 g/dL (ref 6.5–8.1)

## 2024-06-22 LAB — ETHANOL: Alcohol, Ethyl (B): 135 mg/dL — ABNORMAL HIGH (ref <15)
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
## 2024-06-22 NOTE — Discharge Instructions (Addendum)
 Return for any problem.  ?

## 2024-06-22 NOTE — ED Provider Notes (Signed)
 [place] EMERGENCY DEPARTMENT AT [facility] Provider Note   CSN: [id number] Arrival date & time: 06/22/24  1718     Patient presents with: Mental Health Problem   Ernest Romero is a 58 y.o. male.   58 year old male presents with complaint of needing a Klonopin  refill.  Patient reports that he drinks regularly.  Patient is requesting a Klonopin  refill.  He reports that he drinks because he does not have Klonopin .  Patient is intoxicated on initial evaluation.  Of note, patient was given a Librium  prescription 2 days prior with his last visit.  It does not appear that he filled this prescription.  Patient denies suicidality.  Patient denies homicidality.  Patient allowed to sleep comfortably on his hallway bed for several hours.  Recently diagnosed with small subdural on prior evaluation earlier this week.  Will repeat CT imaging.  The history is provided by the patient and medical records.       Prior to Admission medications   Medication Sig Start Date End Date Taking? Authorizing Provider  amLODipine  (NORVASC ) 5 MG tablet Take 5 mg by mouth daily. 11/28/23   [provider]  cephALEXin  (KEFLEX ) 500 MG capsule Take 1 capsule (500 mg total) by mouth 3 (three) times daily. 03/18/24   Saunders Shona CROME, PA-C  chlordiazePOXIDE  (LIBRIUM ) 25 MG capsule 50mg  PO TID x 1D, then 25-50mg  PO BID X 1D, then 25-50mg  PO QD X 1D 06/16/24   Freddi Hamilton, MD  clonazePAM  (KLONOPIN ) 1 MG tablet Take 1 tablet (1 mg total) by mouth 2 (two) times daily. 02/25/24   Cleaster Tinnie LABOR, PA-C  ketoconazole (NIZORAL) 2 % cream Apply a small amount, 1-2 g, to affected area on feet twice a day for 2 to 4 weeks. 11/28/23   [provider]  lithium  carbonate (ESKALITH ) 450 MG CR tablet Take 1 tablet (450 mg total) by mouth at bedtime. 06/01/16   Sainani, Vivek J, MD  lurasidone  (LATUDA ) 20 MG TABS tablet Take 20 mg by mouth daily with breakfast.    [provider]  naproxen   (NAPROSYN ) 500 MG tablet Take 1 tablet (500 mg total) by mouth 2 (two) times daily with a meal. 03/18/24   Saunders Shona CROME, PA-C  SUBOXONE  8-2 MG FILM Place one strip under the tongue twice daily in the morning and evening and one-half strip at midday as directed.    [provider]    Allergies: Patient has no known allergies.    Review of Systems  All other systems reviewed and are negative.   Updated Vital Signs BP 94/76   Pulse 82   Temp 98.5 F (36.9 C)   Resp 19   SpO2 97%   Physical Exam Vitals and nursing note reviewed.  Constitutional:      General: He is not in acute distress.    Appearance: Normal appearance. He is well-developed.  HENT:     Head: Normocephalic.     Comments: Healing contusions to the right forehead and around the right eye. Eyes:     Conjunctiva/sclera: Conjunctivae normal.     Pupils: Pupils are equal, round, and reactive to light.  Cardiovascular:     Rate and Rhythm: Normal rate and regular rhythm.     Heart sounds: Normal heart sounds.  Pulmonary:     Effort: Pulmonary effort is normal. No respiratory distress.     Breath sounds: Normal breath sounds.  Abdominal:     General: There is no distension.  Palpations: Abdomen is soft.     Tenderness: There is no abdominal tenderness.  Musculoskeletal:        General: No deformity. Normal range of motion.     Cervical back: Normal range of motion and neck supple.  Skin:    General: Skin is warm and dry.  Neurological:     General: No focal deficit present.     Mental Status: He is alert and oriented to person, place, and time.     (all labs ordered are listed, but only abnormal results are displayed) Labs Reviewed  COMPREHENSIVE METABOLIC PANEL WITH GFR - Abnormal; Notable for the following components:      Result Value   CO2 21 (*)    Glucose, Bld 100 (*)    Creatinine, Ser 1.37 (*)    GFR, Estimated 59 (*)    All other components within normal limits  ETHANOL -  Abnormal; Notable for the following components:   Alcohol, Ethyl (B) 135 (*)    All other components within normal limits  CBC WITH DIFFERENTIAL/PLATELET  URINE DRUG SCREEN    EKG: None  Radiology: No results found.   Procedures   Medications Ordered in the ED - No data to display                                  Medical Decision Making Patient presents with alcohol intoxication.  Patient is requesting  transfer to old [place] for Klonopin .  Patient denied suicidality or homicidality.  Patient without indication of psychosis.  Patient without indication for IVC hold.  Repeat CT imaging is without evidence of worsening subdural that was previously identified.  Alcohol level is minimally elevated at 135 on initial check.  After several hours of sleep the patient appears to be clinically sober.  He is advised that there is no medical or psychiatric reason for admission or continued care here in ED.  He is advised that he needs to be discharged.  He is reminded of the Librium  prescription that was given several days ago.  He reports that he did not fill this.  He is advised that if he would like to take it it may provide him some benefit.  He is advised not to take Librium  with alcohol.  Subsequently the patient became irate and was screaming and yelling and cursing at staff.  Security was asked to escort him out.  Amount and/or Complexity of Data Reviewed Labs: ordered. Radiology: ordered.        Final diagnoses:  Alcoholic intoxication without complication [facility])    ED Discharge Orders     None          Laurice Maude BROCKS, MD 06/22/24 2134

## 2024-06-22 NOTE — ED Triage Notes (Addendum)
 Pt states he has been out of Klonopin , and he has been self medicating with alcohol. Pt states Jesus told him there is a war going on and I'm putting you on the front line. Pt requesting to go to [facility], denies SI/HI, just needs to be back on his meds.

## 2024-06-22 NOTE — ED Notes (Signed)
 ED Provider at bedside.

## 2024-06-30 ENCOUNTER — Emergency Department: Payer: MEDICAID | Admitting: Certified Registered Nurse Anesthetist

## 2024-06-30 ENCOUNTER — Emergency Department: Payer: MEDICAID

## 2024-06-30 ENCOUNTER — Other Ambulatory Visit: Payer: Self-pay

## 2024-06-30 ENCOUNTER — Inpatient Hospital Stay
Admission: EM | Admit: 2024-06-30 | Discharge: 2024-07-15 | DRG: 025 | Disposition: A | Discharge status: Other Acute Inpatient Hospital | Payer: MEDICAID

## 2024-06-30 ENCOUNTER — Encounter: Payer: Self-pay | Admitting: Emergency Medicine

## 2024-06-30 ENCOUNTER — Encounter
Admission: EM | Disposition: A | Discharge status: Other Acute Inpatient Hospital | Payer: Self-pay | Source: Home / Self Care

## 2024-06-30 DIAGNOSIS — S06360A Traumatic hemorrhage of cerebrum, unspecified, without loss of consciousness, initial encounter: Secondary | ICD-10-CM | POA: Diagnosis not present

## 2024-06-30 DIAGNOSIS — Z79899 Other long term (current) drug therapy: Secondary | ICD-10-CM

## 2024-06-30 DIAGNOSIS — Z6821 Body mass index (BMI) 21.0-21.9, adult: Secondary | ICD-10-CM

## 2024-06-30 DIAGNOSIS — Z556 Problems related to health literacy: Secondary | ICD-10-CM | POA: Diagnosis not present

## 2024-06-30 DIAGNOSIS — F1010 Alcohol abuse, uncomplicated: Secondary | ICD-10-CM | POA: Diagnosis not present

## 2024-06-30 DIAGNOSIS — G2119 Other drug induced secondary parkinsonism: Secondary | ICD-10-CM | POA: Diagnosis not present

## 2024-06-30 DIAGNOSIS — I6200 Nontraumatic subdural hemorrhage, unspecified: Secondary | ICD-10-CM | POA: Diagnosis not present

## 2024-06-30 DIAGNOSIS — K5900 Constipation, unspecified: Secondary | ICD-10-CM | POA: Diagnosis not present

## 2024-06-30 DIAGNOSIS — F17290 Nicotine dependence, other tobacco product, uncomplicated: Secondary | ICD-10-CM | POA: Diagnosis present

## 2024-06-30 DIAGNOSIS — R569 Unspecified convulsions: Secondary | ICD-10-CM | POA: Diagnosis not present

## 2024-06-30 DIAGNOSIS — Z967 Presence of other bone and tendon implants: Secondary | ICD-10-CM | POA: Diagnosis not present

## 2024-06-30 DIAGNOSIS — Z1152 Encounter for screening for COVID-19: Secondary | ICD-10-CM | POA: Diagnosis not present

## 2024-06-30 DIAGNOSIS — E440 Moderate protein-calorie malnutrition: Secondary | ICD-10-CM | POA: Diagnosis present

## 2024-06-30 DIAGNOSIS — F10939 Alcohol use, unspecified with withdrawal, unspecified: Secondary | ICD-10-CM

## 2024-06-30 DIAGNOSIS — F410 Panic disorder [episodic paroxysmal anxiety] without agoraphobia: Secondary | ICD-10-CM | POA: Diagnosis present

## 2024-06-30 DIAGNOSIS — J95851 Ventilator associated pneumonia: Secondary | ICD-10-CM | POA: Diagnosis present

## 2024-06-30 DIAGNOSIS — G252 Other specified forms of tremor: Secondary | ICD-10-CM | POA: Diagnosis not present

## 2024-06-30 DIAGNOSIS — T847XXA Infection and inflammatory reaction due to other internal orthopedic prosthetic devices, implants and grafts, initial encounter: Secondary | ICD-10-CM | POA: Diagnosis not present

## 2024-06-30 DIAGNOSIS — F17210 Nicotine dependence, cigarettes, uncomplicated: Secondary | ICD-10-CM | POA: Diagnosis not present

## 2024-06-30 DIAGNOSIS — F444 Conversion disorder with motor symptom or deficit: Secondary | ICD-10-CM | POA: Diagnosis not present

## 2024-06-30 DIAGNOSIS — K5901 Slow transit constipation: Secondary | ICD-10-CM | POA: Diagnosis not present

## 2024-06-30 DIAGNOSIS — F3176 Bipolar disorder, in full remission, most recent episode depressed: Secondary | ICD-10-CM | POA: Diagnosis not present

## 2024-06-30 DIAGNOSIS — Y848 Other medical procedures as the cause of abnormal reaction of the patient, or of later complication, without mention of misadventure at the time of the procedure: Secondary | ICD-10-CM | POA: Diagnosis present

## 2024-06-30 DIAGNOSIS — T8579XA Infection and inflammatory reaction due to other internal prosthetic devices, implants and grafts, initial encounter: Secondary | ICD-10-CM | POA: Diagnosis not present

## 2024-06-30 DIAGNOSIS — I10 Essential (primary) hypertension: Secondary | ICD-10-CM | POA: Diagnosis present

## 2024-06-30 DIAGNOSIS — F1590 Other stimulant use, unspecified, uncomplicated: Secondary | ICD-10-CM | POA: Diagnosis not present

## 2024-06-30 DIAGNOSIS — Z9181 History of falling: Secondary | ICD-10-CM

## 2024-06-30 DIAGNOSIS — M952 Other acquired deformity of head: Secondary | ICD-10-CM | POA: Diagnosis not present

## 2024-06-30 DIAGNOSIS — F319 Bipolar disorder, unspecified: Secondary | ICD-10-CM | POA: Diagnosis present

## 2024-06-30 DIAGNOSIS — E46 Unspecified protein-calorie malnutrition: Secondary | ICD-10-CM | POA: Diagnosis not present

## 2024-06-30 DIAGNOSIS — F310 Bipolar disorder, current episode hypomanic: Secondary | ICD-10-CM | POA: Diagnosis not present

## 2024-06-30 DIAGNOSIS — R400 Somnolence: Secondary | ICD-10-CM | POA: Diagnosis not present

## 2024-06-30 DIAGNOSIS — R410 Disorientation, unspecified: Secondary | ICD-10-CM | POA: Diagnosis not present

## 2024-06-30 DIAGNOSIS — J9601 Acute respiratory failure with hypoxia: Secondary | ICD-10-CM | POA: Diagnosis present

## 2024-06-30 DIAGNOSIS — T847XXD Infection and inflammatory reaction due to other internal orthopedic prosthetic devices, implants and grafts, subsequent encounter: Secondary | ICD-10-CM | POA: Diagnosis not present

## 2024-06-30 DIAGNOSIS — F99 Mental disorder, not otherwise specified: Secondary | ICD-10-CM | POA: Diagnosis not present

## 2024-06-30 DIAGNOSIS — S020XXA Fracture of vault of skull, initial encounter for closed fracture: Secondary | ICD-10-CM | POA: Diagnosis not present

## 2024-06-30 DIAGNOSIS — B9562 Methicillin resistant Staphylococcus aureus infection as the cause of diseases classified elsewhere: Secondary | ICD-10-CM | POA: Diagnosis not present

## 2024-06-30 DIAGNOSIS — T8149XA Infection following a procedure, other surgical site, initial encounter: Secondary | ICD-10-CM | POA: Diagnosis not present

## 2024-06-30 DIAGNOSIS — G2571 Drug induced akathisia: Secondary | ICD-10-CM | POA: Diagnosis not present

## 2024-06-30 DIAGNOSIS — R4189 Other symptoms and signs involving cognitive functions and awareness: Secondary | ICD-10-CM

## 2024-06-30 DIAGNOSIS — F10931 Alcohol use, unspecified with withdrawal delirium: Secondary | ICD-10-CM | POA: Diagnosis not present

## 2024-06-30 DIAGNOSIS — Z87891 Personal history of nicotine dependence: Secondary | ICD-10-CM | POA: Diagnosis not present

## 2024-06-30 DIAGNOSIS — F1090 Alcohol use, unspecified, uncomplicated: Secondary | ICD-10-CM | POA: Diagnosis not present

## 2024-06-30 DIAGNOSIS — F1011 Alcohol abuse, in remission: Secondary | ICD-10-CM | POA: Diagnosis not present

## 2024-06-30 DIAGNOSIS — F418 Other specified anxiety disorders: Secondary | ICD-10-CM | POA: Diagnosis not present

## 2024-06-30 DIAGNOSIS — F1910 Other psychoactive substance abuse, uncomplicated: Secondary | ICD-10-CM | POA: Diagnosis not present

## 2024-06-30 DIAGNOSIS — G9341 Metabolic encephalopathy: Secondary | ICD-10-CM | POA: Diagnosis not present

## 2024-06-30 DIAGNOSIS — Z5181 Encounter for therapeutic drug level monitoring: Secondary | ICD-10-CM | POA: Diagnosis not present

## 2024-06-30 DIAGNOSIS — F10239 Alcohol dependence with withdrawal, unspecified: Secondary | ICD-10-CM | POA: Diagnosis present

## 2024-06-30 DIAGNOSIS — R001 Bradycardia, unspecified: Secondary | ICD-10-CM | POA: Diagnosis present

## 2024-06-30 DIAGNOSIS — G9340 Encephalopathy, unspecified: Secondary | ICD-10-CM | POA: Diagnosis present

## 2024-06-30 DIAGNOSIS — A4902 Methicillin resistant Staphylococcus aureus infection, unspecified site: Secondary | ICD-10-CM | POA: Diagnosis not present

## 2024-06-30 DIAGNOSIS — T86832 Bone graft infection: Secondary | ICD-10-CM | POA: Diagnosis not present

## 2024-06-30 DIAGNOSIS — S065XAD Traumatic subdural hemorrhage with loss of consciousness status unknown, subsequent encounter: Secondary | ICD-10-CM | POA: Diagnosis not present

## 2024-06-30 DIAGNOSIS — F314 Bipolar disorder, current episode depressed, severe, without psychotic features: Secondary | ICD-10-CM | POA: Diagnosis not present

## 2024-06-30 DIAGNOSIS — J9600 Acute respiratory failure, unspecified whether with hypoxia or hypercapnia: Secondary | ICD-10-CM | POA: Diagnosis not present

## 2024-06-30 DIAGNOSIS — F10930 Alcohol use, unspecified with withdrawal, uncomplicated: Secondary | ICD-10-CM | POA: Diagnosis not present

## 2024-06-30 DIAGNOSIS — R253 Fasciculation: Secondary | ICD-10-CM | POA: Diagnosis not present

## 2024-06-30 DIAGNOSIS — Z9889 Other specified postprocedural states: Secondary | ICD-10-CM

## 2024-06-30 DIAGNOSIS — F332 Major depressive disorder, recurrent severe without psychotic features: Secondary | ICD-10-CM | POA: Diagnosis not present

## 2024-06-30 DIAGNOSIS — R251 Tremor, unspecified: Secondary | ICD-10-CM | POA: Diagnosis not present

## 2024-06-30 DIAGNOSIS — S065XAA Traumatic subdural hemorrhage with loss of consciousness status unknown, initial encounter: Secondary | ICD-10-CM | POA: Diagnosis present

## 2024-06-30 DIAGNOSIS — E43 Unspecified severe protein-calorie malnutrition: Secondary | ICD-10-CM | POA: Diagnosis not present

## 2024-06-30 HISTORY — PX: CRANIOTOMY: SHX93

## 2024-06-30 LAB — MAGNESIUM
Magnesium: 2.5 mg/dL — ABNORMAL HIGH (ref 1.7–2.4)
Magnesium: 2.3 mg/dL (ref 1.7–2.4)

## 2024-06-30 LAB — BASIC METABOLIC PANEL WITH GFR
Anion gap: 13 (ref 5–15)
BUN: 14 mg/dL (ref 6–20)
CO2: 21 mmol/L — ABNORMAL LOW (ref 22–32)
Calcium: 8.3 mg/dL — ABNORMAL LOW (ref 8.9–10.3)
Chloride: 108 mmol/L (ref 98–111)
Creatinine, Ser: 0.96 mg/dL (ref 0.61–1.24)
GFR, Estimated: >60 mL/min (ref >60)
Glucose, Bld: 126 mg/dL — ABNORMAL HIGH (ref 70–99)
Potassium: 4.5 mmol/L (ref 3.5–5.1)
Sodium: 142 mmol/L (ref 135–145)

## 2024-06-30 LAB — CBC WITH DIFFERENTIAL/PLATELET
Abs Immature Granulocytes: 0.05 K/uL (ref 0.00–0.07)
Abs Immature Granulocytes: 0.02 K/uL (ref 0.00–0.07)
Basophils Absolute: 0 K/uL (ref 0.0–0.1)
Basophils Absolute: 0 K/uL (ref 0.0–0.1)
Basophils Relative: 0 %
Basophils Relative: 0 %
Eosinophils Absolute: 0 K/uL (ref 0.0–0.5)
Eosinophils Absolute: 0 K/uL (ref 0.0–0.5)
Eosinophils Relative: 0 %
Eosinophils Relative: 0 %
HCT: 47.7 % (ref 39.0–52.0)
HCT: 39.5 % (ref 39.0–52.0)
Hemoglobin: 15.9 g/dL (ref 13.0–17.0)
Hemoglobin: 13.4 g/dL (ref 13.0–17.0)
Immature Granulocytes: 0 %
Immature Granulocytes: 0 %
Lymphocytes Relative: 8 %
Lymphocytes Relative: 5 %
Lymphs Abs: 1 K/uL (ref 0.7–4.0)
Lymphs Abs: 0.5 K/uL — ABNORMAL LOW (ref 0.7–4.0)
MCH: 30.5 pg (ref 26.0–34.0)
MCH: 30.5 pg (ref 26.0–34.0)
MCHC: 33.3 g/dL (ref 30.0–36.0)
MCHC: 33.9 g/dL (ref 30.0–36.0)
MCV: 91.4 fL (ref 80.0–100.0)
MCV: 90 fL (ref 80.0–100.0)
Monocytes Absolute: 0.7 K/uL (ref 0.1–1.0)
Monocytes Absolute: 0.2 K/uL (ref 0.1–1.0)
Monocytes Relative: 6 %
Monocytes Relative: 3 %
Neutro Abs: 10.4 K/uL — ABNORMAL HIGH (ref 1.7–7.7)
Neutro Abs: 8.3 K/uL — ABNORMAL HIGH (ref 1.7–7.7)
Neutrophils Relative %: 86 %
Neutrophils Relative %: 92 %
Platelets: 261 K/uL (ref 150–400)
Platelets: 242 K/uL (ref 150–400)
RBC: 5.22 MIL/uL (ref 4.22–5.81)
RBC: 4.39 MIL/uL (ref 4.22–5.81)
RDW: 14.1 % (ref 11.5–15.5)
RDW: 14.2 % (ref 11.5–15.5)
WBC: 12.1 K/uL — ABNORMAL HIGH (ref 4.0–10.5)
WBC: 9 K/uL (ref 4.0–10.5)
nRBC: 0 % (ref 0.0–0.2)
nRBC: 0 % (ref 0.0–0.2)

## 2024-06-30 LAB — COMPREHENSIVE METABOLIC PANEL WITH GFR
ALT: 26 U/L (ref 0–44)
AST: 48 U/L — ABNORMAL HIGH (ref 15–41)
Albumin: 4.1 g/dL (ref 3.5–5.0)
Alkaline Phosphatase: 81 U/L (ref 38–126)
Anion gap: 13 (ref 5–15)
BUN: 15 mg/dL (ref 6–20)
CO2: 24 mmol/L (ref 22–32)
Calcium: 9 mg/dL (ref 8.9–10.3)
Chloride: 105 mmol/L (ref 98–111)
Creatinine, Ser: 1 mg/dL (ref 0.61–1.24)
GFR, Estimated: >60 mL/min (ref >60)
Glucose, Bld: 120 mg/dL — ABNORMAL HIGH (ref 70–99)
Potassium: 4.7 mmol/L (ref 3.5–5.1)
Sodium: 142 mmol/L (ref 135–145)
Total Bilirubin: 1 mg/dL (ref 0.0–1.2)
Total Protein: 7.8 g/dL (ref 6.5–8.1)

## 2024-06-30 LAB — BLOOD GAS, VENOUS
Acid-Base Excess: 3 mmol/L — ABNORMAL HIGH (ref 0.0–2.0)
Bicarbonate: 28.5 mmol/L — ABNORMAL HIGH (ref 20.0–28.0)
O2 Saturation: 97.5 %
Patient temperature: 37
pCO2, Ven: 46 mmHg (ref 44–60)
pH, Ven: 7.4 (ref 7.25–7.43)
pO2, Ven: 78 mmHg — ABNORMAL HIGH (ref 32–45)

## 2024-06-30 LAB — GLUCOSE, CAPILLARY
Glucose-Capillary: 101 mg/dL — ABNORMAL HIGH (ref 70–99)
Glucose-Capillary: 111 mg/dL — ABNORMAL HIGH (ref 70–99)
Glucose-Capillary: 130 mg/dL — ABNORMAL HIGH (ref 70–99)

## 2024-06-30 LAB — CBG MONITORING, ED: Glucose-Capillary: 119 mg/dL — ABNORMAL HIGH (ref 70–99)

## 2024-06-30 LAB — MRSA NEXT GEN BY PCR, NASAL: MRSA by PCR Next Gen: NOT DETECTED

## 2024-06-30 LAB — RESP PANEL BY RT-PCR (RSV, FLU A&B, COVID)  RVPGX2
Influenza A by PCR: NEGATIVE
Influenza B by PCR: NEGATIVE
Resp Syncytial Virus by PCR: NEGATIVE
SARS Coronavirus 2 by RT PCR: NEGATIVE

## 2024-06-30 LAB — BLOOD GAS, ARTERIAL
Acid-base deficit: 1.1 mmol/L (ref 0.0–2.0)
Bicarbonate: 24.8 mmol/L (ref 20.0–28.0)
FIO2: 60 %
MECHVT: 500 mL
Mechanical Rate: 18
O2 Saturation: 96.5 %
PEEP: 5 cmH2O
Patient temperature: 37
pCO2 arterial: 45 mmHg (ref 32–48)
pH, Arterial: 7.35 (ref 7.35–7.45)
pO2, Arterial: 209 mmHg — ABNORMAL HIGH (ref 83–108)

## 2024-06-30 LAB — TSH: TSH: 0.775 u[IU]/mL (ref 0.350–4.500)

## 2024-06-30 LAB — HIV ANTIBODY (ROUTINE TESTING W REFLEX): HIV Screen 4th Generation wRfx: NONREACTIVE

## 2024-06-30 LAB — URINE DRUG SCREEN, QUALITATIVE (ARMC ONLY)
Amphetamines, Ur Screen: NOT DETECTED
Barbiturates, Ur Screen: NOT DETECTED
Benzodiazepine, Ur Scrn: POSITIVE — AB
Cannabinoid 50 Ng, Ur ~~LOC~~: POSITIVE — AB
Cocaine Metabolite,Ur ~~LOC~~: POSITIVE — AB
MDMA (Ecstasy)Ur Screen: NOT DETECTED
Methadone Scn, Ur: NOT DETECTED
Opiate, Ur Screen: NOT DETECTED
Phencyclidine (PCP) Ur S: NOT DETECTED
Tricyclic, Ur Screen: NOT DETECTED

## 2024-06-30 LAB — ABO/RH: ABO/RH(D): O NEG

## 2024-06-30 LAB — LITHIUM LEVEL: Lithium Lvl: <0.1 mmol/L — ABNORMAL LOW (ref 0.60–1.20)

## 2024-06-30 LAB — AMMONIA: Ammonia: 22 umol/L (ref 9–35)

## 2024-06-30 LAB — PROTIME-INR
INR: 1 (ref 0.8–1.2)
Prothrombin Time: 13.8 s (ref 11.4–15.2)

## 2024-06-30 LAB — LACTIC ACID, PLASMA
Lactic Acid, Venous: 0.6 mmol/L (ref 0.5–1.9)
Lactic Acid, Venous: 0.6 mmol/L (ref 0.5–1.9)

## 2024-06-30 LAB — TROPONIN I (HIGH SENSITIVITY)
Troponin I (High Sensitivity): 5 ng/L (ref <18)
Troponin I (High Sensitivity): 5 ng/L (ref <18)

## 2024-06-30 LAB — ETHANOL: Alcohol, Ethyl (B): <15 mg/dL (ref <15)

## 2024-06-30 LAB — CK: Total CK: 290 U/L (ref 49–397)

## 2024-06-30 LAB — APTT: aPTT: 30 s (ref 24–36)

## 2024-06-30 SURGERY — CRANIOTOMY HEMATOMA EVACUATION EPIDURAL
Anesthesia: General | Site: Head | Laterality: Left

## 2024-06-30 MED ORDER — PHENYLEPHRINE HCL-NACL 20-0.9 MG/250ML-% IV SOLN
INTRAVENOUS | Status: DC | PRN
  Administered 2024-06-30: 20 ug/min via INTRAVENOUS

## 2024-06-30 MED ORDER — LEVETIRACETAM (KEPPRA) 500 MG/5 ML ADULT IV PUSH
1500.0000 mg | Freq: Once | INTRAVENOUS | Status: AC
  Administered 2024-06-30: 1500 mg via INTRAVENOUS
  Filled 2024-06-30: qty 15

## 2024-06-30 MED ORDER — ETOMIDATE 2 MG/ML IV SOLN: 20.0000 mg | Freq: Once | INTRAVENOUS | Status: AC

## 2024-06-30 MED ORDER — ETOMIDATE 2 MG/ML IV SOLN
INTRAVENOUS | Status: AC
  Administered 2024-06-30: 20 mg via INTRAVENOUS
  Filled 2024-06-30: qty 10

## 2024-06-30 MED ORDER — HEMOSTATIC AGENTS (NO CHARGE) OPTIME
TOPICAL | Status: DC | PRN
  Administered 2024-06-30: 1

## 2024-06-30 MED ORDER — FENTANYL CITRATE (PF) 100 MCG/2ML IJ SOLN
INTRAMUSCULAR | Status: AC
  Filled 2024-06-30: qty 2

## 2024-06-30 MED ORDER — LEVETIRACETAM (KEPPRA) 500 MG/5 ML ADULT IV PUSH
500.0000 mg | Freq: Two times a day (BID) | INTRAVENOUS | Status: DC
  Administered 2024-06-30 – 2024-07-02 (×4): 500 mg via INTRAVENOUS
  Filled 2024-06-30 (×5): qty 5

## 2024-06-30 MED ORDER — REMIFENTANIL HCL 1 MG IV SOLR
INTRAVENOUS | Status: AC
  Filled 2024-06-30: qty 1000

## 2024-06-30 MED ORDER — PROPOFOL 1000 MG/100ML IV EMUL
INTRAVENOUS | Status: AC
  Filled 2024-06-30: qty 100

## 2024-06-30 MED ORDER — FAMOTIDINE 20 MG PO TABS
20.0000 mg | ORAL_TABLET | Freq: Two times a day (BID) | ORAL | Status: DC
  Administered 2024-06-30: 20 mg
  Filled 2024-06-30 (×2): qty 1

## 2024-06-30 MED ORDER — SODIUM CHLORIDE 0.9 % IV SOLN: INTRAVENOUS | Status: DC | PRN

## 2024-06-30 MED ORDER — CHLORHEXIDINE GLUCONATE CLOTH 2 % EX PADS
6.0000 | MEDICATED_PAD | Freq: Every day | CUTANEOUS | Status: DC
  Administered 2024-06-30 – 2024-07-15 (×15): 6 via TOPICAL

## 2024-06-30 MED ORDER — DOCUSATE SODIUM 50 MG/5ML PO LIQD
100.0000 mg | Freq: Two times a day (BID) | ORAL | Status: DC
Start: 2024-06-30 — End: 2024-07-01
  Administered 2024-06-30 – 2024-07-01 (×2): 100 mg
  Filled 2024-06-30 (×2): qty 10

## 2024-06-30 MED ORDER — SODIUM CHLORIDE 0.45 % IV SOLN: INTRAVENOUS | Status: DC | PRN

## 2024-06-30 MED ORDER — ORAL CARE MOUTH RINSE
15.0000 mL | OROMUCOSAL | Status: DC
Start: 2024-06-30 — End: 2024-07-01
  Administered 2024-06-30 – 2024-07-01 (×10): 15 mL via OROMUCOSAL

## 2024-06-30 MED ORDER — HYDRALAZINE HCL 20 MG/ML IJ SOLN
INTRAMUSCULAR | Status: AC
  Administered 2024-06-30: 10 mg via INTRAVENOUS
  Filled 2024-06-30: qty 1

## 2024-06-30 MED ORDER — REMIFENTANIL HCL 1 MG IV SOLR
INTRAVENOUS | Status: DC | PRN
  Administered 2024-06-30: .05 ug/kg/min via INTRAVENOUS

## 2024-06-30 MED ORDER — POLYETHYLENE GLYCOL 3350 17 G PO PACK: 17.0000 g | PACK | Freq: Every day | ORAL | Status: DC | PRN

## 2024-06-30 MED ORDER — ROCURONIUM BROMIDE 10 MG/ML (PF) SYRINGE: 100.0000 mg | PREFILLED_SYRINGE | Freq: Once | INTRAVENOUS | Status: AC

## 2024-06-30 MED ORDER — FENTANYL CITRATE (PF) 100 MCG/2ML IJ SOLN
INTRAMUSCULAR | Status: DC | PRN
  Administered 2024-06-30 (×2): 50 ug via INTRAVENOUS

## 2024-06-30 MED ORDER — THROMBIN 5000 UNITS EX SOLR
CUTANEOUS | Status: DC | PRN
  Administered 2024-06-30: 5000 [IU] via TOPICAL

## 2024-06-30 MED ORDER — SODIUM CHLORIDE 0.9 % IV SOLN: INTRAVENOUS | Status: AC

## 2024-06-30 MED ORDER — LIDOCAINE-EPINEPHRINE 1 %-1:100000 IJ SOLN
INTRAMUSCULAR | Status: DC | PRN
  Administered 2024-06-30: 6 mL

## 2024-06-30 MED ORDER — LIDOCAINE-EPINEPHRINE 1 %-1:100000 IJ SOLN
INTRAMUSCULAR | Status: AC
  Filled 2024-06-30: qty 1

## 2024-06-30 MED ORDER — ROCURONIUM BROMIDE 10 MG/ML (PF) SYRINGE
PREFILLED_SYRINGE | INTRAVENOUS | Status: AC
  Administered 2024-06-30: 100 mg via INTRAVENOUS
  Filled 2024-06-30: qty 10

## 2024-06-30 MED ORDER — FENTANYL 2500MCG IN NS 250ML (10MCG/ML) PREMIX INFUSION
25.0000 ug/h | INTRAVENOUS | Status: DC
  Administered 2024-06-30: 25 ug/h via INTRAVENOUS

## 2024-06-30 MED ORDER — FENTANYL CITRATE PF 50 MCG/ML IJ SOSY: 50.0000 ug | PREFILLED_SYRINGE | Freq: Once | INTRAMUSCULAR | Status: AC

## 2024-06-30 MED ORDER — PROPOFOL 1000 MG/100ML IV EMUL
INTRAVENOUS | Status: AC
  Administered 2024-06-30: 30 ug/kg/min via INTRAVENOUS
  Filled 2024-06-30: qty 100

## 2024-06-30 MED ORDER — FENTANYL 2500MCG IN NS 250ML (10MCG/ML) PREMIX INFUSION
100.0000 ug/h | INTRAVENOUS | Status: DC
  Administered 2024-06-30: 100 ug/h via INTRAVENOUS
  Filled 2024-06-30 (×2): qty 250

## 2024-06-30 MED ORDER — SODIUM CHLORIDE 0.9 % IV SOLN
10.0000 mL/h | Freq: Once | INTRAVENOUS | Status: DC
Start: 2024-06-30 — End: 2024-07-01

## 2024-06-30 MED ORDER — THROMBIN 5000 UNITS EX KIT
PACK | CUTANEOUS | Status: AC
  Filled 2024-06-30: qty 1

## 2024-06-30 MED ORDER — ROCURONIUM BROMIDE 100 MG/10ML IV SOLN
INTRAVENOUS | Status: DC | PRN
  Administered 2024-06-30: 20 mg via INTRAVENOUS
  Administered 2024-06-30: 50 mg via INTRAVENOUS
  Administered 2024-06-30: 30 mg via INTRAVENOUS

## 2024-06-30 MED ORDER — HYDRALAZINE HCL 20 MG/ML IJ SOLN: 10.0000 mg | Freq: Once | INTRAMUSCULAR | Status: AC

## 2024-06-30 MED ORDER — BACITRACIN ZINC 500 UNIT/GM EX OINT
TOPICAL_OINTMENT | CUTANEOUS | Status: AC
  Filled 2024-06-30: qty 28.35

## 2024-06-30 MED ORDER — BACITRACIN ZINC 500 UNIT/GM EX OINT
TOPICAL_OINTMENT | CUTANEOUS | Status: DC | PRN
  Administered 2024-06-30: 1 via TOPICAL

## 2024-06-30 MED ORDER — DOCUSATE SODIUM 50 MG/5ML PO LIQD: 100.0000 mg | Freq: Two times a day (BID) | ORAL | Status: DC | PRN

## 2024-06-30 MED ORDER — DEXAMETHASONE SODIUM PHOSPHATE 10 MG/ML IJ SOLN
INTRAMUSCULAR | Status: DC | PRN
  Administered 2024-06-30: 10 mg via INTRAVENOUS

## 2024-06-30 MED ORDER — CEFTRIAXONE SODIUM 2 G IJ SOLR
2.0000 g | Freq: Once | INTRAMUSCULAR | Status: AC
  Administered 2024-06-30: 2 g via INTRAVENOUS
  Filled 2024-06-30 (×2): qty 20

## 2024-06-30 MED ORDER — NALOXONE HCL 2 MG/2ML IJ SOSY
PREFILLED_SYRINGE | INTRAMUSCULAR | Status: AC | PRN
  Administered 2024-06-30: 2 mg via INTRAVENOUS

## 2024-06-30 MED ORDER — MANNITOL 20 % IV SOLN: INTRAVENOUS | Status: DC | PRN

## 2024-06-30 MED ORDER — FAMOTIDINE 20 MG PO TABS
20.0000 mg | ORAL_TABLET | Freq: Two times a day (BID) | ORAL | Status: DC
  Administered 2024-07-01 (×2): 20 mg
  Filled 2024-06-30 (×2): qty 1

## 2024-06-30 MED ORDER — LORAZEPAM 2 MG/ML IJ SOLN
2.0000 mg | Freq: Once | INTRAMUSCULAR | Status: AC
  Administered 2024-06-30: 2 mg via INTRAVENOUS
  Filled 2024-06-30: qty 1

## 2024-06-30 MED ORDER — HYDROMORPHONE HCL 1 MG/ML IJ SOLN
INTRAMUSCULAR | Status: DC | PRN
  Administered 2024-06-30: .5 mg via INTRAVENOUS

## 2024-06-30 MED ORDER — POLYETHYLENE GLYCOL 3350 17 G PO PACK
17.0000 g | PACK | Freq: Every day | ORAL | Status: DC
  Administered 2024-06-30 – 2024-07-01 (×2): 17 g
  Filled 2024-06-30 (×2): qty 1

## 2024-06-30 MED ORDER — FENTANYL CITRATE PF 50 MCG/ML IJ SOSY
PREFILLED_SYRINGE | INTRAMUSCULAR | Status: AC
  Administered 2024-06-30: 50 ug via INTRAVENOUS
  Filled 2024-06-30: qty 1

## 2024-06-30 MED ORDER — HYDRALAZINE HCL 20 MG/ML IJ SOLN
10.0000 mg | Freq: Once | INTRAMUSCULAR | Status: AC
Start: 2024-06-30 — End: 2024-06-30
  Administered 2024-06-30: 10 mg via INTRAVENOUS

## 2024-06-30 MED ORDER — ORAL CARE MOUTH RINSE: 15.0000 mL | OROMUCOSAL | Status: DC | PRN

## 2024-06-30 MED ORDER — CEFAZOLIN SODIUM-DEXTROSE 2-3 GM-%(50ML) IV SOLR
INTRAVENOUS | Status: DC | PRN
Start: 2024-06-30 — End: 2024-06-30
  Administered 2024-06-30: 2 g via INTRAVENOUS

## 2024-06-30 MED ORDER — MANNITOL 20 % IV SOLN
INTRAVENOUS | Status: AC
Start: 2024-06-30 — End: 2024-06-30
  Filled 2024-06-30: qty 500

## 2024-06-30 MED ORDER — SODIUM CHLORIDE 0.9 % IV BOLUS
500.0000 mL | Freq: Once | INTRAVENOUS | Status: AC
  Administered 2024-06-30: 500 mL via INTRAVENOUS

## 2024-06-30 MED ORDER — FENTANYL CITRATE PF 50 MCG/ML IJ SOSY
50.0000 ug | PREFILLED_SYRINGE | INTRAMUSCULAR | Status: DC | PRN
  Administered 2024-07-01 (×2): 50 ug via INTRAVENOUS

## 2024-06-30 MED ORDER — ACETAMINOPHEN 650 MG RE SUPP
650.0000 mg | Freq: Four times a day (QID) | RECTAL | Status: DC | PRN
  Administered 2024-06-30: 650 mg via RECTAL
  Filled 2024-06-30: qty 1

## 2024-06-30 MED ORDER — HYDROMORPHONE HCL 1 MG/ML IJ SOLN
INTRAMUSCULAR | Status: AC
  Filled 2024-06-30: qty 1

## 2024-06-30 MED ORDER — 0.9 % SODIUM CHLORIDE (POUR BTL) OPTIME
TOPICAL | Status: DC | PRN
  Administered 2024-06-30: 1000 mL
  Administered 2024-06-30: 500 mL
  Administered 2024-06-30: 1000 mL

## 2024-06-30 MED ORDER — HYDRALAZINE HCL 20 MG/ML IJ SOLN
INTRAMUSCULAR | Status: AC
  Filled 2024-06-30: qty 1

## 2024-06-30 MED ORDER — PROPOFOL 1000 MG/100ML IV EMUL
0.0000 ug/kg/min | INTRAVENOUS | Status: DC
  Administered 2024-06-30 (×2): 50 ug/kg/min via INTRAVENOUS
  Administered 2024-06-30: 80 ug/kg/min via INTRAVENOUS
  Filled 2024-06-30 (×3): qty 100

## 2024-06-30 SURGICAL SUPPLY — 43 items
BASIN GRAD PLASTIC 32OZ STRL (MISCELLANEOUS) IMPLANT
BLADE CLIPPER SPEC (BLADE) IMPLANT
BLADE SURG 15 STRL LF DISP TIS (BLADE) IMPLANT
BUR ACORN 7.5 PRECISION (BURR) IMPLANT
BUR SPIRAL ROUTER 2.3 (BUR) IMPLANT
CHLORAPREP W/TINT 26 (MISCELLANEOUS) IMPLANT
CLIP RANEY DISP (INSTRUMENTS) IMPLANT
DRAPE INCISE 23X17 STRL (DRAPES) IMPLANT
DRAPE INCISE IOBAN 66X45 STRL (DRAPES) IMPLANT
DRSG TEGADERM 4X4.75 (GAUZE/BANDAGES/DRESSINGS) IMPLANT
DRSG TELFA 3X8 NADH STRL (GAUZE/BANDAGES/DRESSINGS) IMPLANT
ELECTRODE REM PT RTRN 9FT ADLT (ELECTROSURGICAL) IMPLANT
EVACUATOR DRAINAGE 7X20 100CC (MISCELLANEOUS) IMPLANT
GAUZE 4X4 16PLY ~~LOC~~+RFID DBL (SPONGE) IMPLANT
GLOVE BIO SURGEON STRL SZ8 (GLOVE) IMPLANT
GLOVE SURG SYN 8.5 PF PI (GLOVE) IMPLANT
GOWN SRG LRG LVL 4 IMPRV REINF (GOWNS) IMPLANT
GRAFT DURAGEN MATRIX 3X3 SNGL (Graft) IMPLANT
HEMOSTAT SURGICEL 2X14 (HEMOSTASIS) IMPLANT
HOOK STAY BLUNT/RETRACTOR 5M (MISCELLANEOUS) IMPLANT
KIT TURNOVER KIT A (KITS) IMPLANT
LAVAGE JET IRRISEPT WOUND (IRRIGATION / IRRIGATOR) IMPLANT
MANIFOLD NEPTUNE II (INSTRUMENTS) IMPLANT
NS IRRIG 1000ML POUR BTL (IV SOLUTION) IMPLANT
NS IRRIG 500ML POUR BTL (IV SOLUTION) IMPLANT
PACK LAMINECTOMY ARMC (PACKS) IMPLANT
PAD ARMBOARD POSITIONER FOAM (MISCELLANEOUS) IMPLANT
PATTIES SURGICAL .5 X3 (DISPOSABLE) IMPLANT
PERFORATOR LRG 14-11MM (BIT) IMPLANT
Reservoir Kit Jackson-Pratt IMPLANT
SHEET NEURO XL SOL CTL (MISCELLANEOUS) IMPLANT
SPONGE DRAIN TRACH 4X4 STRL 2S (GAUZE/BANDAGES/DRESSINGS) IMPLANT
SPONGE NEURO XRAY DETECT 1X3 (DISPOSABLE) IMPLANT
STAPLER SKIN PROX 35W (STAPLE) IMPLANT
SURGIFLO W/THROMBIN 8M KIT (HEMOSTASIS) IMPLANT
SUT ETHILON 3-0 (SUTURE) IMPLANT
SUT VIC AB 0 CT1 27XCR 8 STRN (SUTURE) IMPLANT
SUT VIC AB 0 CT2 27 (SUTURE) IMPLANT
SUT VIC AB 2-0 CT1 18 (SUTURE) IMPLANT
SUT VIC AB 3-0 SH 27X BRD (SUTURE) IMPLANT
SYR 10ML LL (SYRINGE) IMPLANT
SYR 20ML LL LF (SYRINGE) IMPLANT
TRAP FLUID SMOKE EVACUATOR (MISCELLANEOUS) IMPLANT

## 2024-06-30 NOTE — ED Provider Notes (Signed)
 [facility] Provider Note    Event Date/Time   First MD Initiated Contact with Patient 06/30/24 0831     (approximate)   History   subdural hematoma   HPI  Ernest Romero is a 58 y.o. male who presents to the ED for evaluation of subdural hematoma   Patient history of alcoholism presents to the ED from local jail for unresponsiveness.  Has had previous subdurals noted on CT head over the past couple weeks with various ED visits, most recently 8 days ago with a small 3 mm left subdural shrinking in size from previous scans.  I get history from jail staff who help bring him in that he was incarcerated about 12 hours prior to arrival for trespassing.  He was on 15-minute repeat observations and a cell by himself, no issues overnight.  Around 715 he was brought his breakfast tray apparently got up out of bed independently, walked to the tray decided he did not want to and walked back to his bed.  At the 0745 check he was noted to be unresponsive without any clear signs of trauma, falls or injuries, laying in bed.  On arrival to the ED we trial 2 mg of IV Narcan  with no improvement of his mental status.   Physical Exam   Triage Vital Signs: ED Triage Vitals  Encounter Vitals Group     BP 06/30/24 0839 (!) 187/95     Girls Systolic BP Percentile --      Girls Diastolic BP Percentile --      Boys Systolic BP Percentile --      Boys Diastolic BP Percentile --      Pulse Rate 06/30/24 0826 (!) 47     Resp 06/30/24 0826 (!) 21     Temp 06/30/24 0906 (!) 96.2 F (35.7 C)     Temp src --      SpO2 06/30/24 0826 100 %     Weight 06/30/24 0829 171 lb 15.3 oz (78 kg)     Height 06/30/24 0829 5' 10 (1.778 m)     Head Circumference --      Peak Flow --      Pain Score --      Pain Loc --      Pain Education --      Exclude from Growth Chart --     Most recent vital signs: Vitals:   06/30/24 0955 06/30/24 1000  BP: (!) 144/107 (!) 145/110  Pulse: 71 72   Resp: 20 20  Temp: 97.9 F (36.6 C) 97.8 F (36.6 C)  SpO2: 100% 100%    General: Unresponsive.  Occasionally seems to respond to noxious stimuli such as IV sticks where he will flinch ipsilateral extremities.  Does not localize to sternal rub.  Only 2-3 beats of clonus to bilateral lower extremities.  No ongoing seizure activity or volitional movements. CV:  Good peripheral perfusion.  Resp:  Normal effort.  Abd:  No distention.  Soft MSK:  No deformity noted.  No clear signs of trauma, light bruising to right maxilla without signs of proptosis, EOM entrapment Neuro:  No focal deficits appreciated. Other:     ED Results / Procedures / Treatments   Labs (all labs ordered are listed, but only abnormal results are displayed) Labs Reviewed  CBC WITH DIFFERENTIAL/PLATELET - Abnormal; Notable for the following components:      Result Value   WBC 12.1 (*)    Neutro Abs 10.4 (*)  All other components within normal limits  COMPREHENSIVE METABOLIC PANEL WITH GFR - Abnormal; Notable for the following components:   Glucose, Bld 120 (*)    AST 48 (*)    All other components within normal limits  BLOOD GAS, VENOUS - Abnormal; Notable for the following components:   pO2, Ven 78 (*)    Bicarbonate 28.5 (*)    Acid-Base Excess 3.0 (*)    All other components within normal limits  URINE DRUG SCREEN, QUALITATIVE ([facility] ONLY) - Abnormal; Notable for the following components:   Cocaine Metabolite,Ur [place] POSITIVE (*)    Cannabinoid 50 Ng, Ur [place] POSITIVE (*)    Benzodiazepine, Ur [place] POSITIVE (*)    All other components within normal limits  MAGNESIUM - Abnormal; Notable for the following components:   Magnesium 2.5 (*)    All other components within normal limits  LITHIUM  LEVEL - Abnormal; Notable for the following components:   Lithium  Lvl <0.10 (*)    All other components within normal limits  CBG MONITORING, ED - Abnormal; Notable for the following components:   Glucose-Capillary 119  (*)    All other components within normal limits  AMMONIA  ETHANOL  TSH  CK  LACTIC ACID, PLASMA  LACTIC ACID, PLASMA  PROTIME-INR  APTT  PREPARE RBC (CROSSMATCH)  ABO/RH  TYPE AND SCREEN  TROPONIN I (HIGH SENSITIVITY)  TROPONIN I (HIGH SENSITIVITY)    EKG Sinus rhythm at a rate of 48 bpm.  Right axis and intervals without for signs of acute ischemia.  No evidence of high-grade block.  Sinus bradycardia.  RADIOLOGY 1 view CXR interpreted by me with ETT near the carina CT head interpreted by me with large acute subdural on the left side with midline shift Repeat 1 view CXR interpreted by me with improved ETT position, good CVC position  Official radiology report(s): DG Chest Portable 1 View Result Date: 06/30/2024 EXAM: 1 VIEW(S) XRAY OF THE CHEST 06/30/2024 09:27:01 AM COMPARISON: 06/30/2024 CLINICAL HISTORY: central line placement. Encounter for central line placement, OG tube placement, and adjustment of intubation FINDINGS: LINES, TUBES AND DEVICES: Endotracheal tube in place with tip 3.5 cm above the carina. Enteric tube in place with tip in expected gastric lumen and side port at gastroesophageal junction. Left subclavian central venous catheter in place with tip at superior cavoatrial junction. LUNGS AND PLEURA: Previously reported potential skin fold artifact overlying the right upper lobe appears less conspicuous. No focal pulmonary opacity. No pulmonary edema. No pleural effusion. No pneumothorax. HEART AND MEDIASTINUM: No acute abnormality of the cardiac and mediastinal silhouettes. BONES AND SOFT TISSUES: No acute osseous abnormality. IMPRESSION: 1. Endotracheal tube tip approximately 3.5 cm above the carina. 2. Enteric tube with tip in the stomach; side port at the gastroesophageal junctionadvance by several centimeters to ensure intragastric position of the side port. 3. Left subclavian central venous catheter with tip at the superior cavoatrial junction. Electronically  signed by: Waddell Calk MD 06/30/2024 09:32 AM EDT RP Workstation: [id number]   CT Head Wo Contrast Result Date: 06/30/2024 EXAM: CT HEAD WITHOUT CONTRAST 06/30/2024 08:56:55 AM TECHNIQUE: CT of the head was performed without the administration of intravenous contrast. Automated exposure control, iterative reconstruction, and/or weight based adjustment of the mA/kV was utilized to reduce the radiation dose to as low as reasonably achievable. COMPARISON: 06/22/2024 CLINICAL HISTORY: Mental status change, unknown cause. Pt arrives via EMS from jail with reports of unresponsiveness, unknown last known well. EMS reports 84% on RA, placed on 6L  and up to 93%. 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 FINDINGS: BRAIN AND VENTRICLES: Marked interval increase in size of acute left subdural hematoma extending over the left frontal, left temporal, and left parietal lobes measuring 2.2 cm in maximum thickness (image 18/4). There is marked mass effect upon the underlying left cerebral hemisphere with effacement of the sulci and left to right midline shift which measures 1.4 cm. There is mass effect upon the left lateral ventricle which is decreased in volume and there is increased volume of the occipital horn of the right lateral ventricle. Small left, inferior parafalcine subdural hematoma is noted (image 8/2), which is new from the previous exam. No acute blood identified within the ventricular system. No signs of acute brain infarct. No hydrocephalus. ORBITS: No acute abnormality. SINUSES: No acute abnormality. SOFT TISSUES AND SKULL: No acute soft tissue abnormality. No skull fracture. IMPRESSION: 1. Marked interval increase in size of acute left subdural hematoma over the left frontal, temporal, and parietal lobes, measuring up to 2.2 cm, with marked mass effect and 1.4 cm left-to-right midline shift, including compression of the left lateral ventricle and contralateral ventricular enlargement. 2. New small left inferior  parafalcine subdural hematoma identified (image 8/2). 3. No acute intraventricular hemorrhage or cerebral infarct. 4. Critical results were called to Dr. [name] on 06/30/2024 at 9:07 am Electronically signed by: Waddell Calk MD 06/30/2024 09:08 AM EDT RP Workstation: [id number]   DG Chest Portable 1 View Result Date: 06/30/2024 EXAM: 1 VIEW(S) XRAY OF THE CHEST 06/30/2024 08:48:51 AM COMPARISON: 02/27/2024 CLINICAL HISTORY: unresponsive. Unresponsive, s/p intubation. Only able to obtain 1 image before pt moved to CT. unresponsive. Unresponsive, s/p intubation. Only able to obtain 1 image before pt moved to CT FINDINGS: LINES, TUBES AND DEVICES: Endotracheal Tube tip terminates at the level of the carina. Recommend withdrawing by at least 2 cm for adequate placement. LUNGS AND PLEURA: Lucent line extending over the right upper lobe is felt to likely represent skin fold artifact. No airspace consolidation. No interstitial edema. No pleural fluid. No pneumothorax. HEART AND MEDIASTINUM: Heart size and mediastinal contours appear normal. BONES AND SOFT TISSUES: No acute osseous abnormality. IMPRESSION: 1. Endotracheal tube tip at the carina; recommend withdrawing at least 2 cm for appropriate positioning. 2. Lucent line extending over the right upper lobe is felt to likely represent skin fold artifact. Consider repeat imaging as clinically indicated. 3.  Lungs are clear. Electronically signed by: Waddell Calk MD 06/30/2024 08:55 AM EDT RP Workstation: [id number]    PROCEDURES and INTERVENTIONS:  .Critical Care  Performed by: [name] Rover, MD Authorized by: [name] Rover, MD   Critical care provider statement:    Critical care time (minutes):  30   Critical care time was exclusive of:  Separately billable procedures and treating other patients   Critical care was necessary to treat or prevent imminent or life-threatening deterioration of the following conditions:  Respiratory failure and CNS failure or  compromise   Critical care was time spent personally by me on the following activities:  Development of treatment plan with patient or surrogate, discussions with consultants, evaluation of patient's response to treatment, examination of patient, ordering and review of laboratory studies, ordering and review of radiographic studies, ordering and performing treatments and interventions, pulse oximetry, re-evaluation of patient's condition and review of old charts .1-3 Lead EKG Interpretation  Performed by: [name] Rover, MD Authorized by: [name] Rover, MD     Interpretation: abnormal     ECG rate:  48  ECG rate assessment: bradycardic     Rhythm: sinus bradycardia     Ectopy: none     Conduction: normal   Procedure Name: Intubation Date/Time: 06/30/2024 10:07 AM  Performed by: [name] Rover, MDPre-anesthesia Checklist: Patient identified, Patient being monitored, Emergency Drugs available, Timeout performed and Suction available Oxygen Delivery Method: Non-rebreather mask Preoxygenation: Pre-oxygenation with 100% oxygen Induction Type: Rapid sequence Ventilation: Mask ventilation without difficulty Laryngoscope Size: Glidescope and 3 Airway Equipment and Method: Rigid stylet Placement Confirmation: ETT inserted through vocal cords under direct vision, CO2 detector and Breath sounds checked- equal and bilateral Secured at: 23 (Initially 25 at the lips, pulled back to 23 at the lips) cm Tube secured with: ETT holder    Central Line  Date/Time: 06/30/2024 10:08 AM  Performed by: [name] Rover, MD Authorized by: [name] Rover, MD   Consent:    Consent obtained:  Emergent situation Pre-procedure details:    Indication(s): central venous access and insufficient peripheral access     Hand hygiene: Hand hygiene performed prior to insertion     Sterile barrier technique: All elements of maximal sterile technique followed     Skin preparation:  Chlorhexidine   Skin preparation agent: Skin  preparation agent completely dried prior to procedure   Sedation:    Sedation type:  Deep Procedure details:    Location:  L subclavian   Patient position:  Supine   Procedural supplies:  Triple lumen   Catheter size:  7 Fr   Landmarks identified: yes     Ultrasound guidance: no     Number of attempts:  1   Successful placement: yes   Post-procedure details:    Post-procedure:  Dressing applied and line sutured   Assessment:  Blood return through all ports, free fluid flow, placement verified by x-ray and no pneumothorax on x-ray   Procedure completion:  Tolerated well, no immediate complications   Medications  naloxone  (NARCAN ) injection (2 mg Intravenous Given 06/30/24 0826)  fentaNYL  in NS (40mcg/ml) infusion-PREMIX (100 mcg/hr Intravenous New Bag/Given 06/30/24 0843)  propofol (DIPRIVAN) 1000 MG/100ML infusion (50 mcg/kg/min  78 kg Intravenous Rate/Dose Change 06/30/24 0907)  0.9 %  sodium chloride  infusion (has no administration in time range)  sodium chloride  0.9 % bolus 500 mL (0 mLs Intravenous Stopped 06/30/24 0953)  etomidate (AMIDATE) injection 20 mg (20 mg Intravenous Given 06/30/24 0841)  rocuronium (ZEMURON) injection 100 mg (100 mg Intravenous Given 06/30/24 0840)  fentaNYL  (SUBLIMAZE ) injection 50 mcg (50 mcg Intravenous Given 06/30/24 0837)  levETIRAcetam (KEPPRA) undiluted injection 1,500 mg (1,500 mg Intravenous Given 06/30/24 0932)  LORazepam  (ATIVAN ) injection 2 mg (2 mg Intravenous Given 06/30/24 0937)     IMPRESSION / MDM / ASSESSMENT AND PLAN / ED COURSE  I reviewed the triage vital signs and the nursing notes.  Differential diagnosis includes, but is not limited to, polypharmacy, intentional overdose or suicide attempt, ICH, seizure or stroke  {Patient presents with symptoms of an acute illness or injury that is potentially life-threatening.  Alcoholic patient presents from local jail unresponsive with evidence of a large acute subdural hematoma  requiring evacuation in the OR with neurosurgery.  Presents unresponsive with minimal response to noxious stimuli, with no improvement with trial of Narcan , quickly intubated for airway protection and respiratory failure.  CT shows large acute subdural hematomas likely etiology of his depressed mental status.  I consulted neurosurgery who plans to take him to the OR after reviewing images.  Subsequently consult with ICU to let  them know of this patient they will be receiving after OR.  Serum workup is generally benign, marginal leukocytosis, no significant metabolic derangements, negative troponin, CK, ethanol level.  UDS with cocaine, cannabis and benzo metabolites.  He has an 18-gauge IV from EMS but nursing staff is unable to place a peripheral IV in addition to this despite repeated attempts.  I placed a left subclavian central line without complication.  He is on fentanyl , propofol drips with boluses of Ativan  on top of this for appropriate sedation.  Remains stable without indication for pressors.  Provide prophylactic Keppra  Clinical Course as of 06/30/24 1013  Sun Jun 30, 2024  0903 I consult with NSGY [DS]  [phone] After reviewing images, neurosurgery recommends going to the OR for drainage.  Requests Keppra administration, preparing 1 unit of PRBCs and they are coming out of the hospital.  OR has been notified [DS]  1010 I consult with ICU, advise them of patient's presentation, current status and plan for OR with NSGY [DS]    Clinical Course User Index [DS] [name] Rover, MD     FINAL CLINICAL IMPRESSION(S) / ED DIAGNOSES   Final diagnoses:  Acute subdural hematoma (HCC)  Unresponsive     Rx / DC Orders   ED Discharge Orders     None        Note:  This document was prepared using Dragon voice recognition software and may include unintentional dictation errors.   [name] Rover, MD 06/30/24 [phone]

## 2024-06-30 NOTE — H&P (Signed)
 Referring Physician:  No referring provider defined for this encounter.  Primary Physician:  [name]    History of Present Illness: Ernest Romero is a 58 y.o. male male who presents to the ED for evaluation of subdural hematoma (left side)   Patient history of alcoholism presents to the ED from local jail for unresponsiveness.  Has had previous subdurals noted on CT head over the past couple weeks with various ED visits, most recently 8 days ago with a small 3 mm left subdural shrinking in size from previous scans.   I get history from jail staff who help bring him in that he was incarcerated about 12 hours prior to arrival for trespassing.  He was on 15-minute repeat observations and a cell by himself, no issues overnight.  Around 715 he was brought his breakfast tray apparently got up out of bed independently, walked to the tray decided he did not want to and walked back to his bed.   At the 0745 check he was noted to be unresponsive without any clear signs of trauma, falls or injuries, laying in bed.   On arrival to the ED we trial 2 mg of IV Narcan  with no improvement of his mental status.   Review of Systems:  A 10 point review of systems is negative, except for the pertinent positives and negatives detailed in the HPI.  Past Medical History: Past Medical History:  Diagnosis Date   Anxiety    Bipolar 1 disorder (HCC)    Depression    Hepatitis    Hypertension     Past Surgical History: History reviewed. No pertinent surgical history.  Problem List: Patient Active Problem List   Diagnosis Date Noted   Behavior concern in adult 04/21/2024   Drug-seeking behavior 03/02/2024   Polysubstance abuse (HCC) 03/02/2024   Delusions (HCC) 04/15/2023   Cocaine abuse with cocaine-induced psychotic disorder, with delusions (HCC) 04/15/2023   Overweight with body mass index (BMI) of 28 to 28.9 in adult 08/30/2022   Hypertension 09/17/2018     Allergies: Allergies as of 06/30/2024   (No Known Allergies)    Medications: @ENCMED @  Social History: Social History   Tobacco Use   Smoking status: Former   Smokeless tobacco: Never  Advertising account planner   Vaping status: Every Day   Substances: Nicotine, Flavoring  Substance Use Topics   Alcohol use: Not Currently    Comment: Denies currently using   Drug use: Not Currently    Comment: Denies current use    Family Medical History: Unable to obtain  Physical Examination: @VITALWITHPAIN @  General: Patient is intubated.  distress.   Head:  Pupils equal, round, and reactive to light.  Respiratory: intubated.  Extremities: No edema.  Vascular: Palpable pulses.  Skin:   On exposed skin, there are no abnormal skin lesions.  NEUROLOGICAL:  Unable to obtain due to patient being intubated and sedated.  Imaging: Marked interval increase in size of acute left subdural hematoma extending over the left frontal, left temporal, and left parietal lobes measuring 2.2 cm in maximum thickness (image 18/4). There is marked mass effect upon the underlying left cerebral hemisphere with effacement of the sulci and left to right midline shift which measures 1.4 cm. There is mass effect upon the left lateral ventricle which is decreased in volume and there is increased volume of the occipital horn of the right lateral ventricle. Small left, inferior parafalcine subdural hematoma is noted (image 8/2), which is new  from the previous exam. No acute blood identified within the ventricular system. No signs of acute brain infarct. No hydrocephalus.     Assessment and Plan: [name] is a 58 y.o. with acute left subdural hematoma.  Given large size, with midline shift and poor neurological examination with patient's young age, emergent left sided craniotomy vs craniectomy is reasonable.  No family to obtain consent from.  Will check coaguation parameters and take emergently to OR.

## 2024-06-30 NOTE — Anesthesia Preprocedure Evaluation (Addendum)
 Anesthesia Evaluation  Patient identified by MRN, date of birth, ID band  Reviewed: Allergy & Precautions, H&P , NPO status , Patient's Chart, lab work & pertinent test results, Unable to perform ROS - Chart review only  Airway    Neck ROM: full   Comment: intubated Dental   Pulmonary former smoker   Pulmonary exam normal        Cardiovascular hypertension, Normal cardiovascular exam     Neuro/Psych  PSYCHIATRIC DISORDERS Anxiety Depression Bipolar Disorder   subdural hematoma    GI/Hepatic negative GI ROS,,,(+)     substance abuse  H/o hep c Bridging fibrosis   Endo/Other  negative endocrine ROS    Renal/GU      Musculoskeletal   Abdominal Normal abdominal exam  (+)   Peds  Hematology negative hematology ROS (+)   Anesthesia Other Findings Polysubstance abuse- benzo, cocaine, cannabinoid + UDS  RU:[id number]: 1. Marked interval increase in size of acute left subdural hematoma over the left frontal, temporal, and parietal lobes, measuring up to 2.2 cm, with marked mass effect and 1.4 cm left-to-right midline shift, including compression of the left lateral ventricle and contralateral ventricular enlargement. 2. New small left inferior parafalcine subdural hematoma identified (image 8/2). 3. No acute intraventricular hemorrhage or cerebral infarct. 4. Critical results were called to Dr. [name] on 06/30/2024 at 9:07 am   From Jail, Unresponsive, intubation in ED  Past Medical History: No date: Anxiety No date: Bipolar 1 disorder (HCC) No date: Depression No date: Hepatitis No date: Hypertension  BMI    Body Mass Index: 24.67 kg/m      Reproductive/Obstetrics negative OB ROS                              Anesthesia Physical Anesthesia Plan  ASA: 5 and emergent  Anesthesia Plan: General ETT   Post-op Pain Management:    Induction: Intravenous  PONV Risk Score and Plan: 2  and Ondansetron  and Dexamethasone  Airway Management Planned: Oral ETT  Additional Equipment:   Intra-op Plan:   Post-operative Plan: Post-operative intubation/ventilation  Informed Consent: I have reviewed the patients History and Physical, chart, labs and discussed the procedure including the risks, benefits and alternatives for the proposed anesthesia with the patient or authorized representative who has indicated his/her understanding and acceptance.     Dental Advisory Given and Only emergency history available  Plan Discussed with: CRNA and Surgeon  Anesthesia Plan Comments: (Emergent two-physician consent)         Anesthesia Quick Evaluation

## 2024-06-30 NOTE — Transfer of Care (Signed)
 Immediate Anesthesia Transfer of Care Note  Patient: Ernest Romero  Procedure(s) Performed: CRANIOTOMY HEMATOMA EVACUATION SUBDURAL (Left: Head)  Patient Location: PACU and ICU  Anesthesia Type:General  Level of Consciousness: sedated and Patient remains intubated per anesthesia plan  Airway & Oxygen Therapy: Patient remains intubated per anesthesia plan and Patient placed on Ventilator (see vital sign flow sheet for setting)  Post-op Assessment: Report given to RN and Post -op Vital signs reviewed and stable  Post vital signs: Reviewed and stable  Last Vitals:  Vitals Value Taken Time  BP 124/96 06/30/24 14:40  Temp    Pulse 78 06/30/24 14:42  Resp 18 06/30/24 14:42  SpO2 100 % 06/30/24 14:42  Vitals shown include unfiled device data.  Last Pain: There were no vitals filed for this visit.       Complications: No notable events documented.

## 2024-06-30 NOTE — Plan of Care (Signed)
  Problem: Clinical Measurements: Goal: Ability to maintain clinical measurements within normal limits will improve Outcome: Progressing Goal: Diagnostic test results will improve Outcome: Progressing   Problem: Pain Managment: Goal: General experience of comfort will improve and/or be controlled Outcome: Progressing

## 2024-06-30 NOTE — Op Note (Signed)
 Indications: 58 y.o. with acute left subdural hematoma.  Due to young age, poor neurological status, it was felt that emergent evacuation would be needed.   Findings: large left subdural hematoma.   Operative Note:  The patient was brought from the preoperative center with intravenous access established as well as an arterial line in place.  Left side of head was marked previously.  Patient was placed on horseshoe and table turned.  Left side of head was shaved and prepped and draped in a sterile fashion.  Prior to incision, a timeout was done, making sure we were operating on correct side - left.  IV antibiotics were given.  #10 blade knife was then used to incise to the subcutaneous tissue and hemostasis was obtained with a bipolar.  Raney clips were placed.  We then used periosteal elevator to move temporalis forward.  This was held in place with fish hooks.  3 burr holes were made with perforator.  The craniotomy was done by connecting the holes.  The bone flap was removed.  We opened the dura.  There was considerable blood.  This was removed with forceps and irrigation.  We made the bone flap a little bigger.  There was a bleeder on the brain that was actively bleeding.  We coagulated.  Due to the size of the hematoma and likely swelling, we elected not to remove bone flap.  We irrigated.  We tunneled a JP drain.  We closed the galea in layers.  Staples were placed for skin.  Complications:  none Estimated Blood Loss: 300 mL  I was present for the entire case

## 2024-06-30 NOTE — ED Notes (Signed)
 Pt sent to OR.

## 2024-06-30 NOTE — Consult Note (Signed)
 NAME:  Ernest Romero, MRN:  [id number], DOB:  1966-06-22, LOS: 0 ADMISSION DATE:  06/30/2024, CONSULTATION DATE:  06/30/2024 REFERRING MD:  Dr. Deatrice, CHIEF COMPLAINT:  Altered Mental Status   History of Present Illness:  Ernest Romero is a 58 y.o. male who presented to [facility] ED for evaluation of a subdural hematoma. The patient presented from local jail for the evaluation of unresponsiveness. Previous Head CT has shown small subdural hematomas over the past few weeks during various ED visits. The most recent found a small 3mm left subdural hematoma that was shrinking in size compared to previous scans.   According to the jail staff, the patient had been incarcerated for approximately 12 hours prior to this acute change in mental status. It is reported that the patient was on 15 minute observations in a cell alone. This morning at approximately 715 he got out of bed to retrieve his breakfast tray without issue. He walked over to the tray, decided he no longer wanted it and successfully returned to his bed without issue. Then at his 745 observation check, he was then found to be unresponsive lying in his bed. There were no signs to suggest trauma, injuries or falls.   ED Course: Upon arrival to [facility] ED, he was given 2mg  of IV narcan  without mental status improvement. EKG was performed displaying sinus bradycardia. Due to unchanged mental status, airway protection and respiratory failure patient was then intubated in the ED. Additionally a central line was placed in the ED. A head CT was ordered and found a left subdural hematoma with a significant midline shift effect. Neurosurgery was consulted and later took him to the OR for evacuation. Patient then admitted to the ICU for further management.     Pertinent  Medical History  Alcoholism Polysubstance Abuse Depression Bipolar 1 Disorder Hypertension  Micro Data: 06/30/24: Respiratory PCR >>  Significant Hospital Events: Including procedures,  antibiotic start and stop dates in addition to other pertinent events   06/30/24: Pt intubated in the ED and central line placed. Head CT found large left subdural hematoma with significant midline shift effect. Neurosurgery consulted and patient taken to the OR for evacuation. Started on propofol and fentanyl  gtts for sedation and Keppra for seizure prophylaxis. PCCM quickly consulted for AMS and respiratory failure.  Interim History / Subjective:  Patient received neurosurgical intervention to evacuate subdural hematoma. Mechanically intubated and sedated with seizure prophylaxis initiated.   Objective    Blood pressure (!) 128/97, pulse 69, temperature (!) 97.2 F (36.2 C), resp. rate 20, height 5' 10 (1.778 m), weight 78 kg, SpO2 100%.    Vent Mode: PRVC FiO2 (%):  [50 %] 50 % Set Rate:  [20 bmp] 20 bmp Vt Set:  [450 mL] 450 mL PEEP:  [5 cmH20] 5 cmH20   Intake/Output Summary (Last 24 hours) at 06/30/2024 1417 Last data filed at 06/30/2024 1401 Gross per 24 hour  Intake 1960 ml  Output 500 ml  Net 1460 ml   Filed Weights   06/30/24 0829  Weight: 78 kg    Examination: General: acute ill male, mechanically ventilated, no acute distress HENT: S/P craniotomy with JP drain in place (see media image), supple, no JVD Lungs: mechanically ventilated, clear throughout, even breath sounds, non-labored Cardiovascular: RRR, S1 S2, no M/R/G, 2+ radial pulses Abdomen: bowel sounds x 4, soft, non-distended Extremities: normal bulk and tone, no peripheral edema Neuro: Sedated, PERRL, sluggish pupillary reflex, does not respond to painful stimuli, gag reflex intact  GU: urethral catheter placed, clear yellow urine  Resolved problem list    Assessment and Plan   #S/P Craniotomy of Left Subdural Hematoma  Hx: Depression, Bipolar 1 Disorder, Polysubstance Disorder - Correct metabolic derangements as indicated - PAD protocol - Remains on propofol and fentanyl  gtts - RASS goal of  0 to  -1 - Keep HOB > 30 degrees - Seizure precautions - Keppra 500mg  BID - Hold outpatient medications - Neuro checks set per Neurosurgery - JP drain evaluation as indicated - WUA daily - Neurosurgery following - Head CT w/o Contrast: s/p left frontoparietal craniotomy with evacuation of subdural hematoma and drain placement. Minimal residual blood products, improved midline shift at 9mm and no new hemorrhage.    #Hypertension Secondary to Subdural Hematoma Hx: Hypertension - Continuous cardiac monitoring - MAP goal >65 - Goal SBP <160 per Neurosurgery - Vasopressors as indicated - Antihypertensives when parameters met - Arterial Line in place   #Acute Respiratory Failure Secondary to Acute Subdural Hematoma #Mechanical Ventilation  - Mechanically ventilated with full vent support - Lung protective strategies - Plateau Pressure less than 30 cm H2O - Wean FiO2 and PEEP as tolerated - Maintain O2 sats above  92% - SBT when parameters met - VAP bundle - CXR intermittently and ABG as needed - Respiratory PCR pending   RENAL - BMP daily and trend renal function - Monitor electrolytes and replace per protocol - Strict I/O's - Avoid nephrotoxic agents as able   GI - Stress ulcer prophylaxis: Famotidine 20mg  BID - Constipation medications as indicated: Miralax and Colace   HEME - Trend CBC - Monitor for s/sx of bleeding - Transfuse if Hgb <7 - Monitor coags as indicated - PT/INR and aPTT stable   ENDO - Hyper/hypoglycemia protocol - CBG Q4H - Goal BG: 140-180   Labs   CBC: Recent Labs  Lab 06/30/24 0833  WBC 12.1*  NEUTROABS 10.4*  HGB 15.9  HCT 47.7  MCV 91.4  PLT 261    Basic Metabolic Panel: Recent Labs  Lab 06/30/24 0833  NA 142  K 4.7  CL 105  CO2 24  GLUCOSE 120*  BUN 15  CREATININE 1.00  CALCIUM 9.0  MG 2.5*   GFR: Estimated Creatinine Clearance: 82.1 mL/min (by C-G formula based on SCr of 1 mg/dL). Recent Labs  Lab 06/30/24 0833  06/30/24 0905  WBC 12.1*  --   LATICACIDVEN  --  0.6    Liver Function Tests: Recent Labs  Lab 06/30/24 0833  AST 48*  ALT 26  ALKPHOS 81  BILITOT 1.0  PROT 7.8  ALBUMIN 4.1   No results for input(s): LIPASE, AMYLASE in the last 168 hours. Recent Labs  Lab 06/30/24 0833  AMMONIA 22    ABG    Component Value Date/Time   HCO3 28.5 (H) 06/30/2024 0905   O2SAT 97.5 06/30/2024 0905     Coagulation Profile: Recent Labs  Lab 06/30/24 1004  INR 1.0    Cardiac Enzymes: Recent Labs  Lab 06/30/24 0833  CKTOTAL 290    HbA1C: No results found for: HGBA1C  CBG: Recent Labs  Lab 06/30/24 0828  GLUCAP 119*    Review of Systems:   Unable to assess due to mechanical ventilation and sedation.  Past Medical History:  He,  has a past medical history of Anxiety, Bipolar 1 disorder (HCC), Depression, Hepatitis, and Hypertension.   Surgical History:  History reviewed. No pertinent surgical history.   Social History:   reports that he  has quit smoking. He has never used smokeless tobacco. He reports that he does not currently use alcohol. He reports that he does not currently use drugs.   Family History:  His family history is not on file.   Allergies No Known Allergies   Home Medications  Prior to Admission medications   Medication Sig Start Date End Date Taking? Authorizing Provider  amLODipine  (NORVASC ) 5 MG tablet Take 5 mg by mouth daily. 11/28/23   [provider]  cephALEXin  (KEFLEX ) 500 MG capsule Take 1 capsule (500 mg total) by mouth 3 (three) times daily. 03/18/24   Saunders Shona CROME, PA-C  chlordiazePOXIDE  (LIBRIUM ) 25 MG capsule 50mg  PO TID x 1D, then 25-50mg  PO BID X 1D, then 25-50mg  PO QD X 1D 06/16/24   Freddi Hamilton, MD  clonazePAM  (KLONOPIN ) 1 MG tablet Take 1 tablet (1 mg total) by mouth 2 (two) times daily. 02/25/24   Cleaster Tinnie LABOR, PA-C  ketoconazole (NIZORAL) 2 % cream Apply a small amount, 1-2 g, to affected area on feet  twice a day for 2 to 4 weeks. 11/28/23   [provider]  lithium  carbonate (ESKALITH ) 450 MG CR tablet Take 1 tablet (450 mg total) by mouth at bedtime. 06/01/16   Sainani, Vivek J, MD  lurasidone  (LATUDA ) 20 MG TABS tablet Take 20 mg by mouth daily with breakfast.    [provider]  naproxen  (NAPROSYN ) 500 MG tablet Take 1 tablet (500 mg total) by mouth 2 (two) times daily with a meal. 03/18/24   Saunders Shona L, PA-C  SUBOXONE  8-2 MG FILM Place one strip under the tongue twice daily in the morning and evening and one-half strip at midday as directed.    [provider]     Critical care time: 60 minutes    Pamula Luther, PA-C Pulmonary/Critical Care PCCM Team Contact Info: [phone]

## 2024-06-30 NOTE — ED Triage Notes (Signed)
 Pt arrives via EMS from jail with reports of unresponsiveness, unknown last known well. EMS reports 84% on RA, placed on 6L and up to 93%. 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

## 2024-07-01 ENCOUNTER — Encounter: Payer: Self-pay | Admitting: Neurological Surgery

## 2024-07-01 DIAGNOSIS — J9600 Acute respiratory failure, unspecified whether with hypoxia or hypercapnia: Secondary | ICD-10-CM

## 2024-07-01 DIAGNOSIS — S065XAA Traumatic subdural hemorrhage with loss of consciousness status unknown, initial encounter: Secondary | ICD-10-CM | POA: Diagnosis not present

## 2024-07-01 LAB — BASIC METABOLIC PANEL WITH GFR
Anion gap: 8 (ref 5–15)
BUN: 16 mg/dL (ref 6–20)
CO2: 22 mmol/L (ref 22–32)
Calcium: 8.2 mg/dL — ABNORMAL LOW (ref 8.9–10.3)
Chloride: 114 mmol/L — ABNORMAL HIGH (ref 98–111)
Creatinine, Ser: 1.06 mg/dL (ref 0.61–1.24)
GFR, Estimated: >60 mL/min (ref >60)
Glucose, Bld: 103 mg/dL — ABNORMAL HIGH (ref 70–99)
Potassium: 4.1 mmol/L (ref 3.5–5.1)
Sodium: 144 mmol/L (ref 135–145)

## 2024-07-01 LAB — CBC WITH DIFFERENTIAL/PLATELET
Abs Immature Granulocytes: 0.03 K/uL (ref 0.00–0.07)
Basophils Absolute: 0 K/uL (ref 0.0–0.1)
Basophils Relative: 0 %
Eosinophils Absolute: 0 K/uL (ref 0.0–0.5)
Eosinophils Relative: 0 %
HCT: 39.6 % (ref 39.0–52.0)
Hemoglobin: 12.9 g/dL — ABNORMAL LOW (ref 13.0–17.0)
Immature Granulocytes: 0 %
Lymphocytes Relative: 17 %
Lymphs Abs: 2.1 K/uL (ref 0.7–4.0)
MCH: 30.4 pg (ref 26.0–34.0)
MCHC: 32.6 g/dL (ref 30.0–36.0)
MCV: 93.4 fL (ref 80.0–100.0)
Monocytes Absolute: 1 K/uL (ref 0.1–1.0)
Monocytes Relative: 8 %
Neutro Abs: 9 K/uL — ABNORMAL HIGH (ref 1.7–7.7)
Neutrophils Relative %: 75 %
Platelets: 241 K/uL (ref 150–400)
RBC: 4.24 MIL/uL (ref 4.22–5.81)
RDW: 14.6 % (ref 11.5–15.5)
WBC: 12.1 K/uL — ABNORMAL HIGH (ref 4.0–10.5)
nRBC: 0 % (ref 0.0–0.2)

## 2024-07-01 LAB — GLUCOSE, CAPILLARY
Glucose-Capillary: 92 mg/dL (ref 70–99)
Glucose-Capillary: 94 mg/dL (ref 70–99)
Glucose-Capillary: 103 mg/dL — ABNORMAL HIGH (ref 70–99)
Glucose-Capillary: 100 mg/dL — ABNORMAL HIGH (ref 70–99)
Glucose-Capillary: 83 mg/dL (ref 70–99)
Glucose-Capillary: 92 mg/dL (ref 70–99)

## 2024-07-01 LAB — PHOSPHORUS: Phosphorus: 3.4 mg/dL (ref 2.5–4.6)

## 2024-07-01 LAB — MAGNESIUM: Magnesium: 2.7 mg/dL — ABNORMAL HIGH (ref 1.7–2.4)

## 2024-07-01 MED ORDER — DEXMEDETOMIDINE HCL IN NACL 400 MCG/100ML IV SOLN
INTRAVENOUS | Status: AC
  Filled 2024-07-01: qty 100

## 2024-07-01 MED ORDER — ADULT MULTIVITAMIN W/MINERALS CH
1.0000 | ORAL_TABLET | Freq: Every day | ORAL | Status: DC
  Administered 2024-07-01: 1
  Filled 2024-07-01: qty 1

## 2024-07-01 MED ORDER — CHLORDIAZEPOXIDE HCL 5 MG PO CAPS: 25.0000 mg | ORAL_CAPSULE | ORAL | Status: DC | PRN

## 2024-07-01 MED ORDER — ACETAMINOPHEN 10 MG/ML IV SOLN
1000.0000 mg | Freq: Four times a day (QID) | INTRAVENOUS | Status: AC
  Administered 2024-07-01 – 2024-07-02 (×4): 1000 mg via INTRAVENOUS
  Filled 2024-07-01 (×4): qty 100

## 2024-07-01 MED ORDER — DEXMEDETOMIDINE HCL IN NACL 400 MCG/100ML IV SOLN
0.0000 ug/kg/h | INTRAVENOUS | Status: DC
  Administered 2024-07-01: 1 ug/kg/h via INTRAVENOUS

## 2024-07-01 MED ORDER — FOLIC ACID 1 MG PO TABS
1.0000 mg | ORAL_TABLET | Freq: Every day | ORAL | Status: DC
  Administered 2024-07-01: 1 mg
  Filled 2024-07-01: qty 1

## 2024-07-01 MED ORDER — LURASIDONE HCL 20 MG PO TABS
20.0000 mg | ORAL_TABLET | Freq: Every day | ORAL | Status: DC
  Administered 2024-07-02 – 2024-07-09 (×8): 20 mg via ORAL
  Filled 2024-07-01 (×8): qty 1

## 2024-07-01 MED ORDER — CLONAZEPAM 0.5 MG PO TBDP
1.0000 mg | ORAL_TABLET | Freq: Two times a day (BID) | ORAL | Status: DC
  Administered 2024-07-01 – 2024-07-02 (×2): 1 mg via ORAL
  Filled 2024-07-01 (×2): qty 2
  Filled 2024-07-01: qty 8

## 2024-07-01 MED ORDER — HYDROXYZINE HCL 25 MG PO TABS
25.0000 mg | ORAL_TABLET | Freq: Four times a day (QID) | ORAL | Status: AC | PRN
  Administered 2024-07-02 – 2024-07-03 (×3): 25 mg via ORAL
  Filled 2024-07-01 (×3): qty 1

## 2024-07-01 MED ORDER — THIAMINE HCL 100 MG/ML IJ SOLN
100.0000 mg | Freq: Every day | INTRAMUSCULAR | Status: AC
  Administered 2024-07-01 – 2024-07-02 (×2): 100 mg via INTRAVENOUS
  Filled 2024-07-01 (×2): qty 2

## 2024-07-01 MED ORDER — FENTANYL CITRATE PF 50 MCG/ML IJ SOSY: 25.0000 ug | PREFILLED_SYRINGE | INTRAMUSCULAR | Status: DC | PRN

## 2024-07-01 MED ORDER — LACTATED RINGERS IV SOLN: INTRAVENOUS | Status: AC

## 2024-07-01 MED ORDER — LACTATED RINGERS IV BOLUS
500.0000 mL | Freq: Once | INTRAVENOUS | Status: AC
  Administered 2024-07-01: 500 mL via INTRAVENOUS

## 2024-07-01 MED ORDER — LITHIUM CARBONATE ER 450 MG PO TBCR
450.0000 mg | EXTENDED_RELEASE_TABLET | Freq: Every day | ORAL | Status: DC
  Administered 2024-07-01 – 2024-07-15 (×15): 450 mg via ORAL
  Filled 2024-07-01 (×16): qty 1

## 2024-07-01 NOTE — Progress Notes (Signed)
 Initial Nutrition Assessment  DOCUMENTATION CODES:   Non-severe (moderate) malnutrition in context of social or environmental circumstances  INTERVENTION:   RD will add supplements with diet advancement   MVI, folic acid  and thiamine  daily   Pt at high refeed risk; recommend monitor potassium, magnesium and phosphorus labs daily until stable  Daily weights   NUTRITION DIAGNOSIS:   Moderate Malnutrition related to social / environmental circumstances as evidenced by moderate fat depletion, severe fat depletion, moderate muscle depletion, severe muscle depletion.  GOAL:   Patient will meet greater than or equal to 90% of their needs  MONITOR:   Diet advancement, Labs, Weight trends, I & O's, Skin  REASON FOR ASSESSMENT:   Ventilator    ASSESSMENT:   58 y/o male with past medical history of alcohol and drug abuse, HTN, bipolar disorder, anxiety, depression and hepatitis who presents from jail with left SDH with midline shift now status post craniotomy with JP drain (now removed) on 9/28.  Pt seen this morning while patient was intubated. Pt has now been extubated. RD will add supplements with diet advancement. Pt is at high refeed risk. Per chart, pt appears fairly weight stable at baseline. Pt +1.3L on his I & Os.   Medications reviewed and include: colace, pepcid, folic acid , MVI, miralax, thiamine   Labs reviewed: K 4.1 wnl, P 3.4 wnl, Mg 2.7(H) Wbc- 12.1(H) Cbgs- 103, 94, 92 x 24 hrs   UOP-   Drains-   NUTRITION - FOCUSED PHYSICAL EXAM:  Flowsheet Row Most Recent Value  Orbital Region Mild depletion  Upper Arm Region Severe depletion  Thoracic and Lumbar Region Moderate depletion  Buccal Region Mild depletion  Temple Region Moderate depletion  Clavicle Bone Region Severe depletion  Clavicle and Acromion Bone Region Severe depletion  Scapular Bone Region Moderate depletion  Dorsal Hand Unable to assess  Patellar Region Severe depletion   Anterior Thigh Region Severe depletion  Posterior Calf Region Severe depletion  Edema (RD Assessment) None  Hair Reviewed  Eyes Reviewed  Mouth Reviewed  Skin Reviewed  Nails Reviewed   Diet Order:   Diet Order             Diet NPO time specified  Diet effective now                  EDUCATION NEEDS:   No education needs have been identified at this time  Skin:  Skin Assessment: Reviewed RN Assessment (incision head)  Last BM:  pta  Height:   Ht Readings from Last 1 Encounters:  06/30/24 5' 10 (1.778 m)    Weight:   Wt Readings from Last 1 Encounters:  07/01/24 75.8 kg    Ideal Body Weight:  75.45 kg  BMI:  Body mass index is 23.98 kg/m.  Estimated Nutritional Needs:   Kcal:  2100-2400kcal/day  Protein:  105-120g/day  Fluid:  2.5-2.5L/day  Augustin Shams MS, RD, LDN If unable to be reached, please send secure chat to RD inpatient available from 8:00a-4:00p daily

## 2024-07-01 NOTE — Progress Notes (Signed)
 PHARMACY CONSULT NOTE - ELECTROLYTES  Pharmacy Consult for Electrolyte Monitoring and Replacement   Recent Labs: Height: 5' 10 (177.8 cm) Weight: 75.8 kg (167 lb 1.7 oz) IBW/kg (Calculated) : 73 Estimated Creatinine Clearance: 77.5 mL/min (by C-G formula based on SCr of 1.06 mg/dL). Potassium (mmol/L)  Date Value  07/01/2024 4.1  10/16/2014 4.2   Magnesium (mg/dL)  Date Value  90/70/7974 2.7 (H)   Calcium (mg/dL)  Date Value  90/70/7974 8.2 (L)   Calcium, Total (mg/dL)  Date Value  98/85/7983 9.6   Albumin (g/dL)  Date Value  90/71/7974 4.1  11/18/2020 4.9  10/16/2014 4.1   Phosphorus (mg/dL)  Date Value  90/70/7974 3.4   Sodium (mmol/L)  Date Value  07/01/2024 144  10/16/2014 138    Assessment  Ernest Romero is a 58 y.o. male presenting with subdural hematoma. PMH significant for alcohol use disorder, psychiatric disorder. Pharmacy has been consulted to monitor and replace electrolytes.  Diet: NPO MIVF: NS @ 100 mL/hr Pertinent medications:   Goal of Therapy: Electrolytes WNL  Plan:  No electrolyte replacement warranted at this time  Check BMP and Mg with AM labs  Thank you for allowing pharmacy to be a part of this patient's care.   Ransom Blanch PGY-1 Pharmacy Resident  [facility]  07/01/2024 7:25 AM

## 2024-07-01 NOTE — Progress Notes (Signed)
 9581 Patient off sedation for 30 mins. Neuro Assessment done with provider at bedside. Patient able to follow commands with all extremities and move all extremites not opening eye but pupils are reactive and patient attempts to blink when eyes are opened for him.  Sedation restarted  0453 per Providers request for  patient to rest.

## 2024-07-01 NOTE — Progress Notes (Signed)
   Neurosurgery Progress Note  History: Ernest Romero iss/p left draniectomy for SDH  POD1: Pt reported to be following commands when off sedation.   Physical Exam: Vitals:   07/01/24 0500 07/01/24 0600  BP: (!) 140/96 (!) 135/101  Pulse: 71   Resp: 19 18  Temp: 99 F (37.2 C) 99 F (37.2 C)  SpO2: 100%    PERRL Intubated and sedated.  Localizes to pain on RUE through sedation and spontaneously moves both lower extremities. Incision with staples in place   Data:  Other tests/results:  CT head 9/28  FINDINGS:   BRAIN AND VENTRICLES: Left frontoparietal craniotomy was performed. The subdural hematoma was evacuated. A drain is in place. Minimal residual blood products remain in the extra-axial space. Pneumocephalus is present. Midline shift is improved now measuring 9 mm. No new hemorrhage is present. No evidence of acute infarct. No hydrocephalus.   ORBITS: No acute abnormality.   SINUSES: No acute abnormality.   SOFT TISSUES AND SKULL: Left frontoparietal craniotomy was performed. A drain is in place. No acute soft tissue abnormality. No skull fracture.   IMPRESSION: 1. Status post left frontoparietal craniotomy with evacuation of subdural hematoma and placement of a drain. 2. Minimal residual blood products in the extra-axial space and pneumocephalus. 3. Improved midline shift, now measuring 9 mm. 4. No new hemorrhage.  Assessment/Plan:  Ernest Romero is a 58 y.o presenting with AMS found to have a large left sided SDH with shift s/p left craniectomy.  - ok to move towards extubation - will plan to remove drain and place stitch - remainder of care per CC team  Edsel Goods PA-C Department of Neurosurgery

## 2024-07-01 NOTE — Progress Notes (Addendum)
 NAME:  Ernest Romero, MRN:  [id number], DOB:  November 29, 1965, LOS: 1 ADMISSION DATE:  06/30/2024, CONSULTATION DATE:  06/30/2024 REFERRING MD:  Dr. Deatrice, CHIEF COMPLAINT:  Altered Mental Status   Brief Pt Description / Synopsis:  58 y.o. male with past medical history of alcohol use disorder, psychiatric disorder presenting from jail after found down due to Left Subdural Hematoma with midline shift, status post Craniotomy with JP drain on 9/28.  Remained intubated post-op.  History of Present Illness:  Ernest Romero is a 58 y.o. male who presented to [facility] ED for evaluation of a subdural hematoma. The patient presented from local jail for the evaluation of unresponsiveness. Previous Head CT has shown small subdural hematomas over the past few weeks during various ED visits. The most recent found a small 3mm left subdural hematoma that was shrinking in size compared to previous scans.   According to the jail staff, the patient had been incarcerated for approximately 12 hours prior to this acute change in mental status. It is reported that the patient was on 15 minute observations in a cell alone. This morning at approximately 715 he got out of bed to retrieve his breakfast tray without issue. He walked over to the tray, decided he no longer wanted it and successfully returned to his bed without issue. Then at his 745 observation check, he was then found to be unresponsive lying in his bed. There were no signs to suggest trauma, injuries or falls.   ED Course: Upon arrival to [facility] ED, he was given 2mg  of IV narcan  without mental status improvement. EKG was performed displaying sinus bradycardia. Due to unchanged mental status, airway protection and respiratory failure patient was then intubated in the ED. Additionally a central line was placed in the ED. A head CT was ordered and found a left subdural hematoma with a significant midline shift effect. Neurosurgery was consulted and later took him to the OR for  evacuation. Patient then admitted to the ICU for further management.     Pertinent  Medical History  Alcoholism Polysubstance Abuse Depression Bipolar 1 Disorder Hypertension  Micro Data: 06/30/24: Respiratory PCR >> negative 06/30/24: HIV Screen>> nonreactive  06/30/24: MRSA PCR negative   Antimicrobials: Anti-infectives (From admission, onward)    Start     Dose/Rate Route Frequency Ordered Stop   06/30/24 1945  cefTRIAXone (ROCEPHIN) 2 g in sodium chloride  0.9 % 100 mL IVPB        2 g 200 mL/hr over 30 Minutes Intravenous  Once 06/30/24 1854 06/30/24 2032       Significant Hospital Events: Including procedures, antibiotic start and stop dates in addition to other pertinent events   06/30/24: Pt intubated in the ED and central line placed. Head CT found large left subdural hematoma with significant midline shift effect. Neurosurgery consulted and patient taken to the OR for evacuation. Started on propofol and fentanyl  gtts for sedation and Keppra for seizure prophylaxis. PCCM quickly consulted for AMS and respiratory failure. 07/01/24: No significant events overnight. Afebrile, hemodynamically stable, on minimal vent support.  Neurosurgery removing drain today.  Plan for WUA & SBT as tolerated ~ EXTUBATED   Interim History / Subjective:  As outlined above under Significant Hospital Events section   Objective    Blood pressure (!) 135/101, pulse 71, temperature 99 F (37.2 C), resp. rate 18, height 5' 10 (1.778 m), weight 75.8 kg, SpO2 100%.    Vent Mode: PRVC FiO2 (%):  [40 %-60 %] 40 %  Set Rate:  [18 bmp-20 bmp] 18 bmp Vt Set:  [450 mL-500 mL] 450 mL PEEP:  [5 cmH20] 5 cmH20 Plateau Pressure:  [11 cmH20-18 cmH20] 18 cmH20   Intake/Output Summary (Last 24 hours) at 07/01/2024 0737 Last data filed at 07/01/2024 0400 Gross per 24 hour  Intake 3178.88 ml  Output 1785 ml  Net 1393.88 ml   Filed Weights   06/30/24 0829 07/01/24 0600  Weight: 78 kg 75.8 kg     Examination: General: acute ill male, sedated, mechanically ventilated, no acute distress HENT: S/P craniotomy with JP drain in place (see media image), supple, no JVD Lungs: mechanically ventilated, clear throughout, even breath sounds, non-labored, overbreathing the vent Cardiovascular: RRR, S1 S2, no M/R/G, 2+ radial pulses Abdomen: bowel sounds x 4, soft, non-distended Extremities: normal bulk and tone, no peripheral edema Neuro: Lightly Sedated, PERRL, starting to follow commands, PERRL GU: urethral catheter placed, clear yellow urine  Resolved problem list    Assessment and Plan   #Left Subdural Hematoma status post Craniotomy on 9/28 #Sedation needs in setting of mechanical ventilation  Hx: Depression, Bipolar 1 Disorder, Polysubstance Disorder including ETOH - Head CT w/o Contrast: s/p left frontoparietal craniotomy with evacuation of subdural hematoma and drain placement. Minimal residual blood products, improved midline shift at 9mm and no new hemorrhage.  -Maintain a RASS goal of 0 to -1 -Fentanyl  & Propofol drips to maintain RASS goal -Avoid sedating medications as able -Daily wake up assessment -Keep HOB > 30 degrees -Seizure precautions -Continue Keppra 500mg  BID -Hold outpatient medications -Neurosurgery following, appreciate input ~ Neuro checks set per Neurosurgery -JP drain removed 9/29 -Obtain STAT Head CT for any change in Neuro exam  #Hypertension Secondary to Subdural Hematoma Hx: Hypertension -Continuous cardiac monitoring -MAP goal >65 -Goal SBP <160 per Neurosurgery -Vasopressors as indicated -Antihypertensives when parameters met -Arterial Line in place  #Acute Respiratory Failure Secondary to Acute Subdural Hematoma #Mechanical Ventilation  -Full vent support, implement lung protective strategies -Plateau pressures less than 30 cm H20 -Wean FiO2 & PEEP as tolerated to maintain O2 sats >92% -Follow intermittent Chest X-ray & ABG as  needed -Spontaneous Breathing Trials when respiratory parameters met and mental status permits -Implement VAP Bundle -Prn Bronchodilators     Labs   CBC: Recent Labs  Lab 06/30/24 0833 06/30/24 1536 07/01/24 0419  WBC 12.1* 9.0 12.1*  NEUTROABS 10.4* 8.3* 9.0*  HGB 15.9 13.4 12.9*  HCT 47.7 39.5 39.6  MCV 91.4 90.0 93.4  PLT 261 242 241    Basic Metabolic Panel: Recent Labs  Lab 06/30/24 0833 06/30/24 1536 07/01/24 0419  NA 142 142 144  K 4.7 4.5 4.1  CL 105 108 114*  CO2 24 21* 22  GLUCOSE 120* 126* 103*  BUN 15 14 16   CREATININE 1.00 0.96 1.06  CALCIUM 9.0 8.3* 8.2*  MG 2.5* 2.3 2.7*  PHOS  --   --  3.4   GFR: Estimated Creatinine Clearance: 77.5 mL/min (by C-G formula based on SCr of 1.06 mg/dL). Recent Labs  Lab 06/30/24 0833 06/30/24 0905 06/30/24 1535 06/30/24 1536 07/01/24 0419  WBC 12.1*  --   --  9.0 12.1*  LATICACIDVEN  --  0.6 0.6  --   --     Liver Function Tests: Recent Labs  Lab 06/30/24 0833  AST 48*  ALT 26  ALKPHOS 81  BILITOT 1.0  PROT 7.8  ALBUMIN 4.1   No results for input(s): LIPASE, AMYLASE in the last 168 hours. Recent Labs  Lab 06/30/24 0833  AMMONIA 22    ABG    Component Value Date/Time   PHART 7.35 06/30/2024 1509   PCO2ART 45 06/30/2024 1509   PO2ART 209 (H) 06/30/2024 1509   HCO3 24.8 06/30/2024 1509   ACIDBASEDEF 1.1 06/30/2024 1509   O2SAT 96.5 06/30/2024 1509     Coagulation Profile: Recent Labs  Lab 06/30/24 1004  INR 1.0    Cardiac Enzymes: Recent Labs  Lab 06/30/24 0833  CKTOTAL 290    HbA1C: No results found for: HGBA1C  CBG: Recent Labs  Lab 06/30/24 0828 06/30/24 1532 06/30/24 1919 06/30/24 2340 07/01/24 0335  GLUCAP 119* 130* 101* 111* 92    Review of Systems:   Unable to assess due to mechanical ventilation and sedation.  Past Medical History:  He,  has a past medical history of Anxiety, Bipolar 1 disorder (HCC), Depression, Hepatitis, and Hypertension.    Surgical History:  History reviewed. No pertinent surgical history.   Social History:   reports that he has quit smoking. He has never used smokeless tobacco. He reports that he does not currently use alcohol. He reports that he does not currently use drugs.   Family History:  His family history is not on file.   Allergies No Known Allergies   Home Medications  Prior to Admission medications   Medication Sig Start Date End Date Taking? Authorizing Provider  amLODipine  (NORVASC ) 5 MG tablet Take 5 mg by mouth daily. 11/28/23   [provider]  cephALEXin  (KEFLEX ) 500 MG capsule Take 1 capsule (500 mg total) by mouth 3 (three) times daily. 03/18/24   Saunders Shona CROME, PA-C  chlordiazePOXIDE  (LIBRIUM ) 25 MG capsule 50mg  PO TID x 1D, then 25-50mg  PO BID X 1D, then 25-50mg  PO QD X 1D 06/16/24   Freddi Hamilton, MD  clonazePAM  (KLONOPIN ) 1 MG tablet Take 1 tablet (1 mg total) by mouth 2 (two) times daily. 02/25/24   Cleaster Tinnie LABOR, PA-C  ketoconazole (NIZORAL) 2 % cream Apply a small amount, 1-2 g, to affected area on feet twice a day for 2 to 4 weeks. 11/28/23   [provider]  lithium  carbonate (ESKALITH ) 450 MG CR tablet Take 1 tablet (450 mg total) by mouth at bedtime. 06/01/16   Sainani, Vivek J, MD  lurasidone  (LATUDA ) 20 MG TABS tablet Take 20 mg by mouth daily with breakfast.    [provider]  naproxen  (NAPROSYN ) 500 MG tablet Take 1 tablet (500 mg total) by mouth 2 (two) times daily with a meal. 03/18/24   Saunders Shona CROME, PA-C  SUBOXONE  8-2 MG FILM Place one strip under the tongue twice daily in the morning and evening and one-half strip at midday as directed.    [provider]     Critical care time: 40 minutes     Inge Lecher, AGACNP-BC [facility] Prefer epic messenger for cross cover needs If after hours, please call E-link    ICU ATTENDING ATTESTATION:  Patient seen and examined and relevant  ancillary tests reviewed.   I agree with the assessment and plan of care as outlined by Inge Lecher NP.    BP 135/86   Pulse 74   Temp 99.7 F (37.6 C)   Resp 14   Ht 5' 10 (1.778 m)   Wt 75.8 kg   SpO2 100%   BMI 23.98 kg/m   CBC    Component Value Date/Time   WBC 12.1 (H) 07/01/2024 0419   RBC  4.24 07/01/2024 0419   HGB 12.9 (L) 07/01/2024 0419   HGB 16.0 10/16/2014 1042   HCT 39.6 07/01/2024 0419   HCT 48.5 10/16/2014 1042   PLT 241 07/01/2024 0419   PLT 222 10/16/2014 1042   MCV 93.4 07/01/2024 0419   MCV 91 10/16/2014 1042   MCH 30.4 07/01/2024 0419   MCHC 32.6 07/01/2024 0419   RDW 14.6 07/01/2024 0419   RDW 13.2 10/16/2014 1042   LYMPHSABS 2.1 07/01/2024 0419   MONOABS 1.0 07/01/2024 0419   EOSABS 0.0 07/01/2024 0419   BASOSABS 0.0 07/01/2024 0419        Latest Ref Rng & Units 07/01/2024    4:19 AM 06/30/2024    3:36 PM 06/30/2024    8:33 AM  BMP  Glucose 70 - 99 mg/dL 896  873  879   BUN 6 - 20 mg/dL 16  14  15    Creatinine 0.61 - 1.24 mg/dL 8.93  9.03  8.99   Sodium 135 - 145 mmol/L 144  142  142   Potassium 3.5 - 5.1 mmol/L 4.1  4.5  4.7   Chloride 98 - 111 mmol/L 114  108  105   CO2 22 - 32 mmol/L 22  21  24    Calcium 8.9 - 10.3 mg/dL 8.2  8.3  9.0          Critical Care Time devoted to patient care services described in this note is 50 Total Care Time minutes.    Nickolas Alm Cellar, M.D.  [facility]  Medical Director [facility] Medical Director [facility] Cardio-Pulmonary Department

## 2024-07-01 NOTE — Progress Notes (Signed)
 Pt.extubated to 4lnc,vital signs WNL.

## 2024-07-01 NOTE — Anesthesia Postprocedure Evaluation (Signed)
 Anesthesia Post Note  Patient: Ernest Romero  Procedure(s) Performed: CRANIOTOMY HEMATOMA EVACUATION SUBDURAL (Left: Head)  Patient location during evaluation: ICU Anesthesia Type: General Level of consciousness: sedated Respiratory status: patient remains intubated per anesthesia plan Cardiovascular status: stable Anesthetic complications: no   No notable events documented.   Last Vitals:  Vitals:   07/01/24 0700 07/01/24 0800  BP: 127/89 119/86  Pulse: 77 72  Resp: 18 18  Temp: 37.2 C 37.1 C  SpO2: 100% 100%    Last Pain:  Vitals:   07/01/24 0800  TempSrc: Bladder  PainSc: Asleep                 Shona Earnie Fare

## 2024-07-01 NOTE — Plan of Care (Signed)
  Problem: Clinical Measurements: Goal: Respiratory complications will improve Outcome: Progressing Goal: Cardiovascular complication will be avoided Outcome: Progressing   Problem: Activity: Goal: Risk for activity intolerance will decrease Outcome: Progressing   Problem: Pain Managment: Goal: General experience of comfort will improve and/or be controlled Outcome: Progressing   Problem: Safety: Goal: Ability to remain free from injury will improve Outcome: Progressing   Problem: Skin Integrity: Goal: Risk for impaired skin integrity will decrease Outcome: Progressing

## 2024-07-01 NOTE — Progress Notes (Signed)
 Requested by NP to turn sedation off for midnight neuro assessment. Patient grimacing and blood pressured increased from the 130s to 160s.Patient with increased peak pressure on vent.  Patient briefly wiggled toes on both feet for me and had non purposeful movement in  both arms from pain.   Per Almarie Nose Restart Prop and fentanyl  at 25mcg  for comfort prop was previously at 50mcg.

## 2024-07-02 DIAGNOSIS — I10 Essential (primary) hypertension: Secondary | ICD-10-CM

## 2024-07-02 DIAGNOSIS — S065XAA Traumatic subdural hemorrhage with loss of consciousness status unknown, initial encounter: Secondary | ICD-10-CM | POA: Diagnosis not present

## 2024-07-02 DIAGNOSIS — F319 Bipolar disorder, unspecified: Secondary | ICD-10-CM | POA: Diagnosis not present

## 2024-07-02 DIAGNOSIS — F10931 Alcohol use, unspecified with withdrawal delirium: Secondary | ICD-10-CM

## 2024-07-02 DIAGNOSIS — E440 Moderate protein-calorie malnutrition: Secondary | ICD-10-CM | POA: Diagnosis not present

## 2024-07-02 DIAGNOSIS — F10939 Alcohol use, unspecified with withdrawal, unspecified: Secondary | ICD-10-CM

## 2024-07-02 LAB — GLUCOSE, CAPILLARY
Glucose-Capillary: 112 mg/dL — ABNORMAL HIGH (ref 70–99)
Glucose-Capillary: 76 mg/dL (ref 70–99)
Glucose-Capillary: 80 mg/dL (ref 70–99)
Glucose-Capillary: 80 mg/dL (ref 70–99)
Glucose-Capillary: 81 mg/dL (ref 70–99)
Glucose-Capillary: 89 mg/dL (ref 70–99)

## 2024-07-02 LAB — CBC
HCT: 32.2 % — ABNORMAL LOW (ref 39.0–52.0)
Hemoglobin: 10.3 g/dL — ABNORMAL LOW (ref 13.0–17.0)
MCH: 30.4 pg (ref 26.0–34.0)
MCHC: 32 g/dL (ref 30.0–36.0)
MCV: 95 fL (ref 80.0–100.0)
Platelets: 174 K/uL (ref 150–400)
RBC: 3.39 MIL/uL — ABNORMAL LOW (ref 4.22–5.81)
RDW: 13.9 % (ref 11.5–15.5)
WBC: 6.4 K/uL (ref 4.0–10.5)
nRBC: 0 % (ref 0.0–0.2)

## 2024-07-02 LAB — MAGNESIUM: Magnesium: 2.4 mg/dL (ref 1.7–2.4)

## 2024-07-02 LAB — RENAL FUNCTION PANEL
Albumin: 2.8 g/dL — ABNORMAL LOW (ref 3.5–5.0)
Anion gap: 6 (ref 5–15)
BUN: 17 mg/dL (ref 6–20)
CO2: 26 mmol/L (ref 22–32)
Calcium: 8.3 mg/dL — ABNORMAL LOW (ref 8.9–10.3)
Chloride: 111 mmol/L (ref 98–111)
Creatinine, Ser: 0.76 mg/dL (ref 0.61–1.24)
GFR, Estimated: >60 mL/min (ref >60)
Glucose, Bld: 91 mg/dL (ref 70–99)
Phosphorus: 2 mg/dL — ABNORMAL LOW (ref 2.5–4.6)
Potassium: 4 mmol/L (ref 3.5–5.1)
Sodium: 143 mmol/L (ref 135–145)

## 2024-07-02 MED ORDER — SODIUM PHOSPHATES 45 MMOLE/15ML IV SOLN
15.0000 mmol | Freq: Once | INTRAVENOUS | Status: AC
  Administered 2024-07-02: 15 mmol via INTRAVENOUS
  Filled 2024-07-02: qty 5

## 2024-07-02 MED ORDER — LORAZEPAM 2 MG/ML IJ SOLN
1.0000 mg | INTRAMUSCULAR | Status: DC | PRN
  Administered 2024-07-02: 1 mg via INTRAVENOUS
  Filled 2024-07-02 (×2): qty 1

## 2024-07-02 MED ORDER — DOCUSATE SODIUM 50 MG/5ML PO LIQD: 100.0000 mg | Freq: Two times a day (BID) | ORAL | Status: DC | PRN

## 2024-07-02 MED ORDER — LORAZEPAM 2 MG/ML IJ SOLN
1.0000 mg | Freq: Once | INTRAMUSCULAR | Status: AC | PRN
  Administered 2024-07-02: 1 mg via INTRAVENOUS

## 2024-07-02 MED ORDER — PHENOBARBITAL 32.4 MG PO TABS
32.4000 mg | ORAL_TABLET | Freq: Three times a day (TID) | ORAL | Status: DC
  Administered 2024-07-06 – 2024-07-08 (×5): 32.4 mg via ORAL
  Filled 2024-07-02 (×5): qty 1

## 2024-07-02 MED ORDER — ENSURE PLUS HIGH PROTEIN PO LIQD
237.0000 mL | Freq: Three times a day (TID) | ORAL | Status: DC
  Administered 2024-07-02 – 2024-07-15 (×36): 237 mL via ORAL

## 2024-07-02 MED ORDER — ENOXAPARIN SODIUM 40 MG/0.4ML IJ SOSY
40.0000 mg | PREFILLED_SYRINGE | INTRAMUSCULAR | Status: DC
  Administered 2024-07-02 – 2024-07-15 (×14): 40 mg via SUBCUTANEOUS
  Filled 2024-07-02 (×14): qty 0.4

## 2024-07-02 MED ORDER — FOLIC ACID 1 MG PO TABS
1.0000 mg | ORAL_TABLET | Freq: Every day | ORAL | Status: DC
  Administered 2024-07-02 – 2024-07-15 (×14): 1 mg via ORAL
  Filled 2024-07-02 (×14): qty 1

## 2024-07-02 MED ORDER — LEVETIRACETAM 500 MG PO TABS
500.0000 mg | ORAL_TABLET | Freq: Two times a day (BID) | ORAL | Status: DC
  Administered 2024-07-02: 500 mg via ORAL
  Filled 2024-07-02: qty 1

## 2024-07-02 MED ORDER — LORAZEPAM 2 MG/ML IJ SOLN
2.0000 mg | INTRAMUSCULAR | Status: DC | PRN
  Administered 2024-07-03 (×2): 2 mg via INTRAVENOUS
  Filled 2024-07-02 (×2): qty 1

## 2024-07-02 MED ORDER — ADULT MULTIVITAMIN W/MINERALS CH
1.0000 | ORAL_TABLET | Freq: Every day | ORAL | Status: DC
  Administered 2024-07-02 – 2024-07-15 (×14): 1 via ORAL
  Filled 2024-07-02 (×14): qty 1

## 2024-07-02 MED ORDER — PHENOBARBITAL 32.4 MG PO TABS
64.8000 mg | ORAL_TABLET | Freq: Three times a day (TID) | ORAL | Status: AC
  Administered 2024-07-04 – 2024-07-06 (×6): 64.8 mg via ORAL
  Filled 2024-07-02 (×6): qty 2

## 2024-07-02 MED ORDER — PHENOBARBITAL 32.4 MG PO TABS
97.2000 mg | ORAL_TABLET | Freq: Three times a day (TID) | ORAL | Status: AC
  Administered 2024-07-02 – 2024-07-04 (×6): 97.2 mg via ORAL
  Filled 2024-07-02 (×6): qty 3

## 2024-07-02 MED ORDER — THIAMINE MONONITRATE 100 MG PO TABS
100.0000 mg | ORAL_TABLET | Freq: Every day | ORAL | Status: DC
  Administered 2024-07-03 – 2024-07-15 (×13): 100 mg via ORAL
  Filled 2024-07-02 (×14): qty 1

## 2024-07-02 NOTE — Assessment & Plan Note (Addendum)
 Status post left frontal parietal craniotomy with evacuation of subdural hematoma and placement of a drain.  Drain removed on 9/29.  Neurosurgical team placed an order for helmet.  Continue working with PT and OT.  On empiric Keppra

## 2024-07-02 NOTE — Evaluation (Signed)
 Occupational Therapy Evaluation Patient Details Name: Ernest Romero MRN: [id number] DOB: [birth year] Today's Date: 07/02/2024   History of Present Illness   Pt is a 58 year old male presenting from jail after found down,  found to have a large left sided SDH with shift s/p left craniectomy 06/30/24    PMH significant for PMH alcohol use disorder, psychiatric disorder     Clinical Impressions Chart reviewed to date, pt greeted semi supine in bed with sitter present. He is asking to eat something. He is oriented to self only, but does report later in the session I am messed up and need to get better. He follows one step commands with increased time and multi modal cues. He initially appears restless, improved with mobility attempts. Pt is a poor historian, will need to confirm PLOF/home set up but does report he is independent in getting dressed, used to work as an Engineer, site. Pt presents with deficits in strength, endurance, activity tolerance, balance, cognition, affecting safe and optimal ADL completion. He is performing ADL/functional mobility below PLOF and will benefit from ongoing OT to address deficits and to facilitate return to PLOF. Pt is left as received, all needs met. OT will follow.      If plan is discharge home, recommend the following:   A lot of help with bathing/dressing/bathroom;A lot of help with walking and/or transfers;Supervision due to cognitive status     Functional Status Assessment   Patient has had a recent decline in their functional status and demonstrates the ability to make significant improvements in function in a reasonable and predictable amount of time.     Equipment Recommendations   Other (comment) (defer to next venue of care)     Recommendations for Other Services         Precautions/Restrictions   Precautions Precautions: Fall Recall of Precautions/Restrictions: Impaired Precaution/Restrictions Comments: helmet, ok to mobilize  with therapy prior to delivery Restrictions Weight Bearing Restrictions Per Provider Order: No     Mobility Bed Mobility Overal bed mobility: Needs Assistance Bed Mobility: Supine to Sit, Sit to Supine     Supine to sit: Max assist, HOB elevated Sit to supine: Max assist, HOB elevated, +2 for safety/equipment        Transfers Overall transfer level: Needs assistance Equipment used: 2 person hand held assist Transfers: Sit to/from Stand Sit to Stand: Mod assist, Min assist, +2 safety/equipment, +2 physical assistance           General transfer comment: static standing for approx 5 minutes for peri care with MOD A +1 via HHA      Balance Overall balance assessment: Needs assistance Sitting-balance support: Feet supported Sitting balance-Leahy Scale: Fair Sitting balance - Comments: CGA but with fatigue progress to MIN-MOD A Postural control: Posterior lean, Right lateral lean Standing balance support: Single extremity supported Standing balance-Leahy Scale: Poor                             ADL either performed or assessed with clinical judgement   ADL Overall ADL's : Needs assistance/impaired Eating/Feeding: Minimal assistance   Grooming: Modified independent;Sitting;Wash/dry face       Lower Body Bathing: Maximal assistance;Total assistance;Sit to/from stand       Lower Body Dressing: Maximal assistance Lower Body Dressing Details (indicate cue type and reason): socks     Toileting- Clothing Manipulation and Hygiene: Maximal assistance;Total assistance;Sit to/from stand Toileting - Architect Details (  indicate cue type and reason): incontinent BM after standing       General ADL Comments: step by step multi modal cues for technique/participation     Vision Patient Visual Report: Blurring of vision Vision Assessment?: Yes Eye Alignment: Within Functional Limits Ocular Range of Motion: Within Functional Limits Tracking/Visual  Pursuits: Able to track stimulus in all quads without difficulty Visual Fields: No apparent deficits Additional Comments: will continue to assess; L eye edema, pt can open it with position changes/washing face     Perception Perception: Impaired Preception Impairment Details: Body Scheme     Praxis Praxis:  (will continue to assess)       Pertinent Vitals/Pain Pain Assessment Pain Assessment: CPOT Facial Expression: Tense Body Movements: Absence of movements Muscle Tension: Relaxed Compliance with ventilator (intubated pts.): N/A Vocalization (extubated pts.): Sighing, moaning CPOT Total: 2 Pain Intervention(s): Monitored during session, Repositioned     Extremity/Trunk Assessment Upper Extremity Assessment Upper Extremity Assessment: Right hand dominant;RUE deficits/detail;LUE deficits/detail RUE Deficits / Details: RUE grossly 4/5 throughout; No tone noted on evaluation, will continue to assess RUE Sensation: WNL RUE Coordination: WNL LUE Deficits / Details: shoulder flexion approx 3/5, elbow flexion/extension 4/5, wrist flexion/extension 4/5; will continue to assess; ?cognition vs true weakness;No tone noted on evaluation, will continue to assess   Lower Extremity Assessment Lower Extremity Assessment: Defer to PT evaluation       Communication Communication Communication: Impaired Factors Affecting Communication: Reduced clarity of speech   Cognition Arousal: Lethargic Behavior During Therapy: Restless Cognition: No family/caregiver present to determine baseline, Cognition impaired, Rancho level   Orientation impairments: Place, Time, Situation Awareness: Intellectual awareness impaired, Online awareness impaired Memory impairment (select all impairments): Short-term memory, Declarative long-term memory, Non-declarative long-term memory Attention impairment (select first level of impairment): Focused attention Executive functioning impairment (select all  impairments): Initiation, Organization, Sequencing, Reasoning, Problem solving                 [place] [employer] Scales of Cognitive Functioning: Confused/Agitated: Maximal Assistance [IV] Following commands: Impaired Following commands impaired: Follows one step commands with increased time     Cueing  General Comments   Cueing Techniques: Verbal cues;Gestural cues;Tactile cues;Visual cues  vss   Exercises Other Exercises Other Exercises: edu re role of OT, role of rehab, discharge recommendations   Shoulder Instructions      Home Living Family/patient expects to be discharged to:: Unsure                                 Additional Comments: pt is a poor historian, reports he lives in [place], works in [employer] but will need to confirm PLOF/home set up      Prior Functioning/Environment Prior Level of Function : Patient poor historian/Family not available;Independent/Modified Independent             Mobility Comments: will need to confirm ADLs Comments: will need to confirm    OT Problem List: Decreased strength;Decreased activity tolerance;Impaired vision/perception;Decreased cognition;Decreased knowledge of use of DME or AE;Impaired tone;Impaired balance (sitting and/or standing);Decreased coordination;Decreased safety awareness;Decreased knowledge of precautions   OT Treatment/Interventions: Self-care/ADL training;Energy conservation;Cognitive remediation/compensation;Balance training;Modalities;Therapeutic exercise;DME and/or AE instruction;Splinting;Patient/family education;Therapeutic activities;Neuromuscular education      OT Goals(Current goals can be found in the care plan section)   Acute Rehab OT Goals Patient Stated Goal: get out of here OT Goal Formulation: With patient Time For Goal Achievement: 07/16/24 Potential to Achieve  Goals: Fair ADL Goals Pt Will Perform Grooming: with supervision;sitting Pt Will Perform Lower Body  Dressing: with min assist;sitting/lateral leans Pt Will Transfer to Toilet: with min assist Pt Will Perform Toileting - Clothing Manipulation and hygiene: with min assist   OT Frequency:  Min 3X/week    Co-evaluation PT/OT/SLP Co-Evaluation/Treatment: Yes Reason for Co-Treatment: Complexity of the patient's impairments (multi-system involvement);Necessary to address cognition/behavior during functional activity;To address functional/ADL transfers   OT goals addressed during session: ADL's and self-care;Proper use of Adaptive equipment and DME      AM-PAC OT 6 Clicks Daily Activity     Outcome Measure Help from another person eating meals?: A Little Help from another person taking care of personal grooming?: A Little Help from another person toileting, which includes using toliet, bedpan, or urinal?: A Lot Help from another person bathing (including washing, rinsing, drying)?: A Lot Help from another person to put on and taking off regular upper body clothing?: A Lot Help from another person to put on and taking off regular lower body clothing?: A Lot 6 Click Score: 14   End of Session Nurse Communication: Mobility status  Activity Tolerance: Patient tolerated treatment well Patient left: in bed;with call bell/phone within reach;with bed alarm set;with nursing/sitter in room  OT Visit Diagnosis: Other abnormalities of gait and mobility (R26.89);Muscle weakness (generalized) (M62.81);Other symptoms and signs involving the nervous system (R29.898);Cognitive communication deficit (R41.841);Other symptoms and signs involving cognitive function                Time: 8949-8867 OT Time Calculation (min): 42 min Charges:  OT General Charges $OT Visit: 1 Visit OT Evaluation $OT Eval High Complexity: 1 High  Therisa Sheffield, OTD OTR/L  07/02/24, 1:49 PM

## 2024-07-02 NOTE — Progress Notes (Signed)

## 2024-07-02 NOTE — Progress Notes (Signed)
 Orthopedic Tech Progress Note Patient Details:  Ernest Romero September 05, 1966 [id number]  Calle din order to [facility] for a STAT CRANIECTOMY HELMET  Patient ID: Ernest Romero, male   DOB: 1965-10-20, 58 y.o.   MRN: [id number]  Ernest Romero Pac 07/02/2024, 1:26 PM

## 2024-07-02 NOTE — Assessment & Plan Note (Signed)
 Critical care team consulted psychiatry and placed on lithium  and Latuda 

## 2024-07-02 NOTE — Hospital Course (Signed)
 58 y.o. male who presented to [facility] ED for evaluation of a subdural hematoma. The [facility] from [facility] jail for the evaluation of unresponsiveness. Previous Head CT has shown small subdural hematomas over the past few weeks during various ED visits. The most recent found a small 3mm left subdural hematoma that was shrinking in size compared to previous scans.    According to the jail staff, the patient had been incarcerated for approximately 12 hours prior to this acute change in mental status. It is reported that the patient was on 15 minute observations in a cell alone. This morning at approximately 715 he got out of bed to retrieve his breakfast tray without issue. He walked over to the tray, decided he no longer wanted it and successfully returned to his bed without issue. Then at his 745 observation check, he was then found to be unresponsive lying in his bed. There were no signs to suggest trauma, injuries or falls.    ED Course: Upon arrival to [facility] ED, he was given 2mg  of IV narcan  without mental status improvement. EKG was performed displaying sinus bradycardia. Due to unchanged mental status, airway protection and respiratory failure patient was then intubated in the ED. Additionally a central line was placed in the ED. A head CT was ordered and found a left subdural hematoma with a significant midline shift effect. Neurosurgery was consulted and later took him to the OR for evacuation. Patient then admitted to the ICU for further management.   06/30/24: Pt intubated in the ED and central line placed. Head CT found large left subdural hematoma with significant midline shift effect. Neurosurgery consulted and patient taken to the OR for evacuation. Started on propofol and fentanyl  gtts for sedation and Keppra for seizure prophylaxis. PCCM quickly consulted for AMS and respiratory failure. 07/01/24: No significant events overnight. Afebrile, hemodynamically stable, on minimal vent support.   Neurosurgery removing drain today.  Plan for WUA & SBT as tolerated ~ EXTUBATED  9/30.  Medical team taking over.  Patient was switched to phenobarbital taper for alcohol withdrawal

## 2024-07-02 NOTE — Progress Notes (Addendum)
 Nutrition Follow Up Note   DOCUMENTATION CODES:   Non-severe (moderate) malnutrition in context of social or environmental circumstances  INTERVENTION:   Ensure Plus High Protein po BID, each supplement provides 350 kcal and 20 grams of protein  Magic cup TID with meals, each supplement provides 290 kcal and 9 grams of protein  MVI, folic acid  and thiamine  daily   Pt remains at high refeed risk; recommend monitor potassium, magnesium and phosphorus labs daily until stable  Daily weights   NUTRITION DIAGNOSIS:   Moderate Malnutrition related to social / environmental circumstances as evidenced by moderate fat depletion, severe fat depletion, moderate muscle depletion, severe muscle depletion. -ongoing   GOAL:   Patient will meet greater than or equal to 90% of their needs -not met   MONITOR:   PO intake, Supplement acceptance, Labs, Weight trends, I & O's, Skin  ASSESSMENT:   58 y/o male with past medical history of alcohol and drug abuse, HTN, bipolar disorder, anxiety, depression and hepatitis who presents from jail with left SDH with midline shift now status post craniotomy with JP drain (now removed) on 9/28.  Pt extubated yesterday. Pt is slightly more alert today. Pt has been initiated on a diet. RD will add supplements to help pt meet his estimated needs. RD will downgrade patient to a mechanical soft diet until he is more alert. Recommend consideration of cortrak tube placement and nutrition support if pt's oral intake is poor. Pt remains at high refeed risk. No BM yet. Per chart, pt is down ~7lbs since admission. Pt +3.6L on his I & Os.     Medications reviewed and include: folic acid , lithium , MVI, thiamine , LRS @75ml /hr, sodium phosphate  Labs reviewed: K 4.0 wnl, P 2.0(L), Mg 2.4 wnl  Hgb 10.3(L), Hct 32.2(L) Cbgs- 80, 76, 89 x 24 hrs   UOP-   Diet Order:    Diet Order             DIET DYS 3 Room service appropriate? Yes; Fluid consistency: Thin   Diet effective now                  EDUCATION NEEDS:   No education needs have been identified at this time  Skin:  Skin Assessment: Reviewed RN Assessment (incision head)  Last BM:  pta  Height:   Ht Readings from Last 1 Encounters:  06/30/24 5' 10 (1.778 m)    Weight:   Wt Readings from Last 1 Encounters:  07/02/24 72.7 kg    Ideal Body Weight:  75.45 kg  BMI:  Body mass index is 23 kg/m.  Estimated Nutritional Needs:   Kcal:  2100-2400kcal/day  Protein:  105-120g/day  Fluid:  2.5-2.5L/day  Augustin Shams MS, RD, LDN If unable to be reached, please send secure chat to RD inpatient available from 8:00a-4:00p daily

## 2024-07-02 NOTE — Assessment & Plan Note (Signed)
 Replacing phosphorus today IV

## 2024-07-02 NOTE — Assessment & Plan Note (Signed)
 Continue supplements

## 2024-07-02 NOTE — Progress Notes (Addendum)
   Neurosurgery Progress Note  History: Ernest Romero iss/p left draniectomy for SDH  POD2:  Pt extubated yesterday. Pt complaining about mitts this morning. Denying headache  POD1: Pt reported to be following commands when off sedation.   Physical Exam: Vitals:   07/02/24 0600 07/02/24 0700  BP: 115/71 114/76  Pulse: (!) 50 (!) 55  Resp: 12 (!) 25  Temp:    SpO2: 98% 98%   PERRL  Oriented to place but not situation.  MAEW. Unwilling to participate fully in strength exam.  Incision with staples in place   Data:  Other tests/results:  CT head 9/28  FINDINGS:   BRAIN AND VENTRICLES: Left frontoparietal craniotomy was performed. The subdural hematoma was evacuated. A drain is in place. Minimal residual blood products remain in the extra-axial space. Pneumocephalus is present. Midline shift is improved now measuring 9 mm. No new hemorrhage is present. No evidence of acute infarct. No hydrocephalus.   ORBITS: No acute abnormality.   SINUSES: No acute abnormality.   SOFT TISSUES AND SKULL: Left frontoparietal craniotomy was performed. A drain is in place. No acute soft tissue abnormality. No skull fracture.   IMPRESSION: 1. Status post left frontoparietal craniotomy with evacuation of subdural hematoma and placement of a drain. 2. Minimal residual blood products in the extra-axial space and pneumocephalus. 3. Improved midline shift, now measuring 9 mm. 4. No new hemorrhage.  Assessment/Plan:  Ernest Romero is a 58 y.o presenting with AMS found to have a large left sided SDH with shift s/p left craniectomy.  - Drain removed 9/29.  - will continue to monitor exam - OK to work with therapy. Order for helmet placed. Ok to work with therapy prior to delivery - remainder of care per CC team  Edsel Goods PA-C Department of Neurosurgery

## 2024-07-02 NOTE — Plan of Care (Signed)
  Problem: Clinical Measurements: Goal: Will remain free from infection Outcome: Progressing Goal: Respiratory complications will improve Outcome: Progressing   Problem: Clinical Measurements: Goal: Ability to maintain clinical measurements within normal limits will improve Outcome: Not Progressing Goal: Diagnostic test results will improve Outcome: Not Progressing   Problem: Activity: Goal: Risk for activity intolerance will decrease Outcome: Not Progressing   Problem: Coping: Goal: Level of anxiety will decrease Outcome: Not Progressing   Problem: Safety: Goal: Ability to remain free from injury will improve Outcome: Not Progressing

## 2024-07-02 NOTE — Assessment & Plan Note (Signed)
 Continue thiamine , folic acid  and phenobarbital taper

## 2024-07-02 NOTE — Progress Notes (Signed)
 Progress Note   Patient: Ernest Romero FMW:[id number] DOB: 11/23/1965 DOA: 06/30/2024     2 DOS: the patient was seen and examined on 07/02/2024   Brief hospital course: 58 y.o. male who presented to [facility] ED for evaluation of a subdural hematoma. The patient presented from local jail for the evaluation of unresponsiveness. Previous Head CT has shown small subdural hematomas over the past few weeks during various ED visits. The most recent found a small 3mm left subdural hematoma that was shrinking in size compared to previous scans.    According to the jail staff, the patient had been incarcerated for approximately 12 hours prior to this acute change in mental status. It is reported that the patient was on 15 minute observations in a cell alone. This morning at approximately 715 he got out of bed to retrieve his breakfast tray without issue. He walked over to the tray, decided he no longer wanted it and successfully returned to his bed without issue. Then at his 745 observation check, he was then found to be unresponsive lying in his bed. There were no signs to suggest trauma, injuries or falls.    ED Course: Upon arrival to [facility] ED, he was given 2mg  of IV narcan  without mental status improvement. EKG was performed displaying sinus bradycardia. Due to unchanged mental status, airway protection and respiratory failure patient was then intubated in the ED. Additionally a central line was placed in the ED. A head CT was ordered and found a left subdural hematoma with a significant midline shift effect. Neurosurgery was consulted and later took him to the OR for evacuation. Patient then admitted to the ICU for further management.   06/30/24: Pt intubated in the ED and central line placed. Head CT found large left subdural hematoma with significant midline shift effect. Neurosurgery consulted and patient taken to the OR for evacuation. Started on propofol and fentanyl  gtts for sedation and Keppra for seizure  prophylaxis. PCCM quickly consulted for AMS and respiratory failure. 07/01/24: No significant events overnight. Afebrile, hemodynamically stable, on minimal vent support.  Neurosurgery removing drain today.  Plan for WUA & SBT as tolerated ~ EXTUBATED  9/30.  Medical team taking over.  Patient was switched to phenobarbital taper for alcohol withdrawal  Assessment and Plan: * Subdural hematoma (HCC) Status post left frontal parietal craniotomy with evacuation of subdural hematoma and placement of a drain.  Drain removed on 9/29.  Neurosurgical team placed an order for helmet.  Continue working with PT and OT.  On empiric Keppra  Alcohol withdrawal (HCC) Continue thiamine , folic acid  and phenobarbital taper  Malnutrition of moderate degree Continue supplements  Bipolar disorder [facility]) Critical care team consulted psychiatry and placed on lithium  and Latuda   Essential hypertension Continue to monitor  Hypophosphatemia Replacing phosphorus today IV        Subjective: Patient feels rough.  He is more concerned with trying to urinate then answering my questions.  Physical therapy was also getting him up to urinate.  Postoperative day 2 for evacuation of subdural hematoma.  Admitted with altered mental status.  Physical Exam: Vitals:   07/02/24 0600 07/02/24 0700 07/02/24 0800 07/02/24 0900  BP: 115/71 114/76  139/77  Pulse: (!) 50 (!) 55 70 60  Resp: 12 (!) 25 19 10   Temp:      TempSrc:      SpO2: 98% 98% 97% 100%  Weight:      Height:       Physical Exam HENT:  Mouth/Throat:     Pharynx: No oropharyngeal exudate.  Eyes:     General: Lids are normal.  Cardiovascular:     Rate and Rhythm: Normal rate and regular rhythm.     Heart sounds: Normal heart sounds, S1 normal and S2 normal.  Pulmonary:     Breath sounds: No decreased breath sounds, wheezing, rhonchi or rales.  Abdominal:     Palpations: Abdomen is soft.     Tenderness: There is no abdominal tenderness.   Musculoskeletal:     Right lower leg: No swelling.     Left lower leg: No swelling.  Skin:    General: Skin is warm.     Findings: No rash.  Neurological:     Mental Status: He is alert.     Comments: Patient able to lift his legs up off the bed.  Able to move his arms little weaker with the left arm.     Data Reviewed: Phosphorus 2.0, creatinine 0.76, other electrolytes normal range, albumin 2.8, white blood cell count 6.4, hemoglobin 10.3, platelet count 174 CT scan of the head after left frontoparietal craniotomy shows minimal residual blood products in the extra-axial space and pneumocephalus, improved midline shift now measuring 9 mm.  No new hemorrhage  Family Communication: No contacts in computer  Disposition: Status is: Inpatient Remains inpatient appropriate because: Continue working with PT and OT.  Continue phenobarbital taper  Planned Discharge Destination: Rehab    Time spent: 28 minutes  Author: Charlie Patterson, MD 07/02/2024 2:15 PM  For on call review [email or web address].

## 2024-07-02 NOTE — Assessment & Plan Note (Signed)
 Continue to monitor

## 2024-07-02 NOTE — Evaluation (Signed)
 Physical Therapy Evaluation Patient Details Name: Ernest Romero MRN: [id number] DOB: [birth year] Today's Date: 07/02/2024  History of Present Illness  Pt is a 58 year old male presenting from jail after found down,  found to have a large left sided SDH with shift s/p left craniectomy 06/30/24    PMH significant for PMH alcohol use disorder, psychiatric disorder  Clinical Impression  Patient received in bed, OT at bedside for evaluation.  Patient initially lethargic, but alertness improves throughout session with stimulation, position change and mobility progression.  Intermittently restless and impulsive throughout session, but redirectable with cuing.  Oriented to self, location as hospital; follows simple, one-step commands with increased time.  Intermittently labile (when discussing current situation), but clears with redirection. Does endorse some headache (CPOT 2); unchanged with repositioning or mobility efforts.   Patient with generalized edema to L frontal area, L orbital area; occasional cuing for full eye-opening.  Denies visual changes.  Good tracking and ocular ROM noted. Patient with increased weakness, sensory and coordination deficits to L hemi-body (see details below), but demonstrates good effort to participate with assessment and functional activities as able.  Do suspect some degree of inattention and sensory deficit to L hemi-body; difficulty with R/L discrimination noted. Currently requiring max assist +1-2 for bed mobility; cga to min/mod assist for sitting balance edge of bed; mod assist +1-2 for sit/stand and static standing balance.  Demonstrates static standing for approx 5 minutes for peri care with MOD A +1 via HHA.  Frequent cuing for postural extension; constant facilitation for midline orientation and static standing balance.  Patient largely unaware of deficits, often asking therapist to 'let me go, I want to walk'; generally redirectable. Would benefit from skilled PT to  address above deficits and promote optimal return to PLOF.; recommend post-acute PT follow up as indicated by interdisciplinary care team.           If plan is discharge home, recommend the following: Two people to help with walking and/or transfers;Two people to help with bathing/dressing/bathroom   Can travel by private vehicle        Equipment Recommendations    Recommendations for Other Services       Functional Status Assessment Patient has had a recent decline in their functional status and demonstrates the ability to make significant improvements in function in a reasonable and predictable amount of time.     Precautions / Restrictions Precautions Precautions: Fall Recall of Precautions/Restrictions: Impaired Precaution/Restrictions Comments: helmet, ok to mobilize with therapy prior to delivery per neurosurgical team Restrictions Weight Bearing Restrictions Per Provider Order: No      Mobility  Bed Mobility Overal bed mobility: Needs Assistance Bed Mobility: Supine to Sit     Supine to sit: Max assist, HOB elevated Sit to supine: Max assist, HOB elevated, +2 for safety/equipment        Transfers Overall transfer level: Needs assistance Equipment used: 2 person hand held assist Transfers: Sit to/from Stand Sit to Stand: Mod assist, Min assist, +2 safety/equipment, +2 physical assistance           General transfer comment: static standing for approx 5 minutes for peri care with MOD A +1 via HHA.  Frequent cuing for postural extension; constant facilitation for midline orientation and static standing balance.  Patient largely unaware of deficits, often asking therapist to 'let me go, I want to walk'; generally redirectable.    Ambulation/Gait  General Gait Details: deferred this date  Stairs            Wheelchair Mobility     Tilt Bed    Modified Rankin (Stroke Patients Only)       Balance Overall balance assessment:  Needs assistance Sitting-balance support: Feet supported Sitting balance-Leahy Scale: Fair Sitting balance - Comments: CGA but with fatigue progress to MIN-MOD A; delayed righting reactions Postural control: Posterior lean, Right lateral lean Standing balance support: Single extremity supported Standing balance-Leahy Scale: Poor                               Pertinent Vitals/Pain Pain Assessment Pain Assessment: CPOT Facial Expression: Tense Body Movements: Absence of movements Muscle Tension: Relaxed Compliance with ventilator (intubated pts.): N/A Vocalization (extubated pts.): Sighing, moaning CPOT Total: 2 Pain Intervention(s): Limited activity within patient's tolerance, Monitored during session, Repositioned    Home Living Family/patient expects to be discharged to:: Unsure                   Additional Comments: Pt is a poor historian, reports he lives in [place], 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/home set up    Prior Function Prior Level of Function : Patient poor historian/Family not available;Independent/Modified Independent             Mobility Comments: Patient endorses full indep with ADLs, household and community mobilization; will verify accuracy as support available ADLs Comments: will need to confirm     Extremity/Trunk Assessment   Upper Extremity Assessment Upper Extremity Assessment: Right hand dominant RUE Deficits / Details: RUE grossly 4/5 throughout; No tone noted on evaluation, will continue to assess RUE Sensation: WNL RUE Coordination: WNL LUE Deficits / Details: shoulder flexion approx 3/5, elbow flexion/extension 4/5, wrist flexion/extension 4/5; will continue to assess; ?cognition vs true weakness;No tone noted on evaluation, will continue to assess    Lower Extremity Assessment Lower Extremity Assessment: RLE deficits/detail;LLE deficits/detail RLE Deficits / Details: R LE grossly at least 4-/5 throughout;  +1-2 beat clonus R ankle PF, + babinski.  Sensation grossly intact. LLE Deficits / Details: L LE grossly 3-/5 throughout, except L ankle DF grossly 2-/5; +1-2 beat clonus L ankle PF, - babinski.  Suspect some degree or inattention/sensory deficit.  Difficulty distinguishing L vs R at times.       Communication   Communication Communication: Impaired Factors Affecting Communication: Reduced clarity of speech    Cognition Arousal: Lethargic Behavior During Therapy: Restless   PT - Cognitive impairments: No family/caregiver present to determine baseline                   Rancho Levels of Cognitive Functioning Rancho Mirant Scales of Cognitive Functioning: Confused/Agitated: Maximal Assistance Rancho Mirant Scales of Cognitive Functioning: Confused/Agitated: Maximal Assistance [IV] PT - Cognition Comments: Oriented to self, location as hospital.  Generally restless requiring frequent redirection/reorientation.  Intermittently labile Following commands: Impaired Following commands impaired: Follows one step commands with increased time     Cueing Cueing Techniques: Verbal cues, Gestural cues, Tactile cues, Visual cues     General Comments General comments (skin integrity, edema, etc.): vss    Exercises     Assessment/Plan    PT Assessment Patient needs continued PT services  PT Problem List Decreased strength;Decreased range of motion;Decreased activity tolerance;Decreased balance;Decreased mobility;Decreased coordination;Decreased cognition;Decreased knowledge of use of DME;Decreased safety awareness;Decreased knowledge of precautions;Impaired sensation;Impaired  tone       PT Treatment Interventions DME instruction;Gait training;Stair training;Functional mobility training;Therapeutic activities;Therapeutic exercise;Balance training;Neuromuscular re-education;Cognitive remediation;Patient/family education    PT Goals (Current goals can be found in the Care Plan  section)  Acute Rehab PT Goals Patient Stated Goal: to walk, to go home PT Goal Formulation: With patient Time For Goal Achievement: 07/16/24 Potential to Achieve Goals: Good    Frequency Min 3X/week     Co-evaluation PT/OT/SLP Co-Evaluation/Treatment: Yes Reason for Co-Treatment: Complexity of the patient's impairments (multi-system involvement);Necessary to address cognition/behavior during functional activity;To address functional/ADL transfers PT goals addressed during session: Mobility/safety with mobility OT goals addressed during session: ADL's and self-care;Proper use of Adaptive equipment and DME       AM-PAC PT 6 Clicks Mobility  Outcome Measure Help needed turning from your back to your side while in a flat bed without using bedrails?: A Lot Help needed moving from lying on your back to sitting on the side of a flat bed without using bedrails?: A Lot Help needed moving to and from a bed to a chair (including a wheelchair)?: A Lot Help needed standing up from a chair using your arms (e.g., wheelchair or bedside chair)?: A Lot Help needed to walk in hospital room?: Total Help needed climbing 3-5 steps with a railing? : Total 6 Click Score: 10    End of Session   Activity Tolerance: Patient tolerated treatment well Patient left: in bed;with call bell/phone within reach;with nursing/sitter in room;with bed alarm set Nurse Communication: Mobility status PT Visit Diagnosis: Other symptoms and signs involving the nervous system (R29.898)    Time: 8949-8867 PT Time Calculation (min) (ACUTE ONLY): 42 min   Charges:   PT Evaluation $PT Eval Moderate Complexity: 1 Mod   PT General Charges $$ ACUTE PT VISIT: 1 Visit        Oriana Horiuchi H. Delores, PT, DPT, NCS 07/02/24, 5:09 PM [phone]

## 2024-07-03 LAB — GLUCOSE, CAPILLARY
Glucose-Capillary: 109 mg/dL — ABNORMAL HIGH (ref 70–99)
Glucose-Capillary: 96 mg/dL (ref 70–99)
Glucose-Capillary: 144 mg/dL — ABNORMAL HIGH (ref 70–99)
Glucose-Capillary: 108 mg/dL — ABNORMAL HIGH (ref 70–99)
Glucose-Capillary: 119 mg/dL — ABNORMAL HIGH (ref 70–99)
Glucose-Capillary: 116 mg/dL — ABNORMAL HIGH (ref 70–99)

## 2024-07-03 LAB — CBC
HCT: 34.5 % — ABNORMAL LOW (ref 39.0–52.0)
Hemoglobin: 11.3 g/dL — ABNORMAL LOW (ref 13.0–17.0)
MCH: 30.4 pg (ref 26.0–34.0)
MCHC: 32.8 g/dL (ref 30.0–36.0)
MCV: 92.7 fL (ref 80.0–100.0)
Platelets: 208 K/uL (ref 150–400)
RBC: 3.72 MIL/uL — ABNORMAL LOW (ref 4.22–5.81)
RDW: 13.4 % (ref 11.5–15.5)
WBC: 7 K/uL (ref 4.0–10.5)
nRBC: 0 % (ref 0.0–0.2)

## 2024-07-03 LAB — RENAL FUNCTION PANEL
Albumin: 2.9 g/dL — ABNORMAL LOW (ref 3.5–5.0)
Anion gap: 7 (ref 5–15)
BUN: 14 mg/dL (ref 6–20)
CO2: 26 mmol/L (ref 22–32)
Calcium: 8.5 mg/dL — ABNORMAL LOW (ref 8.9–10.3)
Chloride: 110 mmol/L (ref 98–111)
Creatinine, Ser: 0.9 mg/dL (ref 0.61–1.24)
GFR, Estimated: >60 mL/min (ref >60)
Glucose, Bld: 113 mg/dL — ABNORMAL HIGH (ref 70–99)
Phosphorus: 2.8 mg/dL (ref 2.5–4.6)
Potassium: 3.6 mmol/L (ref 3.5–5.1)
Sodium: 143 mmol/L (ref 135–145)

## 2024-07-03 LAB — PREPARE RBC (CROSSMATCH)

## 2024-07-03 LAB — MAGNESIUM: Magnesium: 2.1 mg/dL (ref 1.7–2.4)

## 2024-07-03 MED ORDER — LORAZEPAM 2 MG/ML IJ SOLN
1.0000 mg | Freq: Once | INTRAMUSCULAR | Status: AC
  Administered 2024-07-03: 1 mg via INTRAVENOUS
  Filled 2024-07-03: qty 1

## 2024-07-03 MED ORDER — LORAZEPAM 2 MG/ML IJ SOLN
0.5000 mg | INTRAMUSCULAR | Status: DC | PRN
  Administered 2024-07-03 – 2024-07-09 (×19): 0.5 mg via INTRAVENOUS
  Filled 2024-07-03 (×9): qty 1
  Filled 2024-07-03: qty 0.25
  Filled 2024-07-03 (×11): qty 1
  Filled 2024-07-03: qty 0.25
  Filled 2024-07-03 (×2): qty 1

## 2024-07-03 MED ORDER — DEXMEDETOMIDINE HCL IN NACL 400 MCG/100ML IV SOLN: 0.0000 ug/kg/h | INTRAVENOUS | Status: DC

## 2024-07-03 NOTE — Progress Notes (Addendum)
   Neurosurgery Progress Note  History: Ernest Romero is s/p left craniectomy for SDH  POD3: Concerns overnight for EtOH withdrawal.  POD2:  Pt extubated yesterday. Pt complaining about mitts this morning. Denying headache  POD1: Pt reported to be following commands when off sedation.   Physical Exam: Vitals:   07/03/24 0500 07/03/24 0600  BP: (!) 157/104 (!) 150/96  Pulse:    Resp: 18 (!) 21  Temp:    SpO2:     PERRL  Oriented to self only.  MAEW.  Incision covered with clean dressing  Data:  Other tests/results:  CT head 9/28  FINDINGS:   BRAIN AND VENTRICLES: Left frontoparietal craniotomy was performed. The subdural hematoma was evacuated. A drain is in place. Minimal residual blood products remain in the extra-axial space. Pneumocephalus is present. Midline shift is improved now measuring 9 mm. No new hemorrhage is present. No evidence of acute infarct. No hydrocephalus.   ORBITS: No acute abnormality.   SINUSES: No acute abnormality.   SOFT TISSUES AND SKULL: Left frontoparietal craniotomy was performed. A drain is in place. No acute soft tissue abnormality. No skull fracture.   IMPRESSION: 1. Status post left frontoparietal craniotomy with evacuation of subdural hematoma and placement of a drain. 2. Minimal residual blood products in the extra-axial space and pneumocephalus. 3. Improved midline shift, now measuring 9 mm. 4. No new hemorrhage.  Assessment/Plan:  Ernest Romero is a 58 y.o presenting with AMS found to have a large left sided SDH with shift s/p left craniectomy.  - He is more difficult to arouse this morning but his neurologic exam is reassuring. I suspect his mental status is due to his acute withdrawal and PRNs.  - Drain removed 9/29.  - will continue to monitor exam - helmet ordered - will d/c Keppra given phenobarbital.  - remainder of care per CC team  Edsel Goods PA-C Department of Neurosurgery

## 2024-07-03 NOTE — Progress Notes (Signed)
 Pt continues to attempt to climb over side rails, slide out of the bottom of the bed, and repeatedly attempts to remove medical equipment and surgical dressing despite administration of scheduled phenobarb and lithium  as well as his PRN atarax  and 0.5 mg ativan  IV. Pt verbally threatening to staff, refuses to cooperate, and frequently disoriented to place and situation at times stating that he's in a [employer] [facility]. Despite the presence of a non-clinical sitter this RN has spent greater than half the time spent on shift providing pt care and physically keeping this pt in his bed.

## 2024-07-03 NOTE — Progress Notes (Addendum)
 Occupational Therapy Treatment Patient Details Name: Ernest Romero MRN: [id number] DOB: 1966/07/22 Today's Date: 07/03/2024   History of present illness Pt is a 58 year old male presenting from jail after found down,  found to have a large left sided SDH with shift s/p left craniectomy 06/30/24    PMH significant for PMH alcohol use disorder, psychiatric disorder   OT comments  Chart reviewed to date, pt greeted semi supine in bed, lethargic. More difficult to be aroused on this date. Attempted to place cold wash cloth on his face and change positions with MAX-TOTAL A +2. Poor sitting balance. Pt follows one step commands inconsistently. Nurse reports pt has been changing positions with staff and it has assisted with his restlessness. Helmet also noted to be snug, called ortho tech and got phone number for [employer]  at [phone] to assist with a larger one. I spoke with the [employer] representative who told me she would follow up. Pt is left semi supine in bed, all needs met. OT will continue to follow.       If plan is discharge home, recommend the following:  A lot of help with bathing/dressing/bathroom;A lot of help with walking and/or transfers;Supervision due to cognitive status   Equipment Recommendations  Other (comment) (defer to next venue of care)    Recommendations for Other Services Rehab consult    Precautions / Restrictions Precautions Precautions: Fall Recall of Precautions/Restrictions: Impaired Precaution/Restrictions Comments: helmet, ok to mobilize with therapy prior to delivery per neurosurgical team;       Mobility Bed Mobility Overal bed mobility: Needs Assistance Bed Mobility: Supine to Sit, Sit to Supine     Supine to sit: HOB elevated, Used rails, +2 for physical assistance, +2 for safety/equipment, Max assist, Total assist Sit to supine: Max assist, +2 for physical assistance, +2 for safety/equipment, HOB elevated, Used rails, Total assist         Transfers                         Balance Overall balance assessment: Needs assistance Sitting-balance support: Feet supported Sitting balance-Leahy Scale: Zero         Standing balance comment: unsafe to attempt on this date                           ADL either performed or assessed with clinical judgement   ADL Overall ADL's : Needs assistance/impaired Eating/Feeding: Minimal assistance;Bed level   Grooming: Maximal assistance;Bed level               Lower Body Dressing: Total assistance                      Extremity/Trunk Assessment Upper Extremity Assessment Upper Extremity Assessment: Difficult to assess due to impaired cognition   Lower Extremity Assessment Lower Extremity Assessment: Difficult to assess due to impaired cognition        Vision   Additional Comments: will continue to assess, L eye edema continues   Perception     Praxis     Communication Communication Communication: Impaired Factors Affecting Communication: Reduced clarity of speech   Cognition Arousal: Lethargic Behavior During Therapy: Flat affect Cognition: No family/caregiver present to determine baseline, Cognition impaired, Rancho level   Orientation impairments: Place, Time, Situation Awareness: Intellectual awareness impaired, Online awareness impaired Memory impairment (select all impairments): Short-term memory, Declarative long-term memory, Non-declarative long-term  memory Attention impairment (select first level of impairment): Focused attention Executive functioning impairment (select all impairments): Initiation, Organization, Sequencing, Reasoning, Problem solving                 Rancho Mirant Scales of Cognitive Functioning: Confused/Agitated: Maximal Assistance [IV] Following commands: Impaired Following commands impaired: Follows one step commands inconsistently      Cueing   Cueing Techniques: Verbal cues, Gestural  cues, Tactile cues, Visual cues  Exercises Other Exercises Other Exercises: edu re role of OT, roleof rehab    Shoulder Instructions       General Comments vss    Pertinent Vitals/ Pain       Pain Assessment Pain Assessment: CPOT Facial Expression: Relaxed, neutral Body Movements: Absence of movements Muscle Tension: Relaxed Compliance with ventilator (intubated pts.): N/A Vocalization (extubated pts.): Talking in normal tone or no sound CPOT Total: 0 Pain Intervention(s): Monitored during session, Repositioned, Premedicated before session  Home Living                                          Prior Functioning/Environment              Frequency  Min 3X/week        Progress Toward Goals  OT Goals(current goals can now be found in the care plan section)  Progress towards OT goals: Progressing toward goals  Acute Rehab OT Goals Time For Goal Achievement: 07/16/24  Plan      Co-evaluation    PT/OT/SLP Co-Evaluation/Treatment: Yes Reason for Co-Treatment: Complexity of the patient's impairments (multi-system involvement);Necessary to address cognition/behavior during functional activity;To address functional/ADL transfers PT goals addressed during session: Mobility/safety with mobility;Balance OT goals addressed during session: ADL's and self-care;Proper use of Adaptive equipment and DME      AM-PAC OT 6 Clicks Daily Activity     Outcome Measure   Help from another person eating meals?: A Little Help from another person taking care of personal grooming?: A Lot Help from another person toileting, which includes using toliet, bedpan, or urinal?: A Lot Help from another person bathing (including washing, rinsing, drying)?: A Lot Help from another person to put on and taking off regular upper body clothing?: A Lot Help from another person to put on and taking off regular lower body clothing?: A Lot 6 Click Score: 13    End of Session     OT Visit Diagnosis: Other abnormalities of gait and mobility (R26.89);Muscle weakness (generalized) (M62.81);Other symptoms and signs involving the nervous system (R29.898);Cognitive communication deficit (R41.841);Other symptoms and signs involving cognitive function   Activity Tolerance Patient limited by lethargy   Patient Left in bed;with call bell/phone within reach;with bed alarm set;with nursing/sitter in room   Nurse Communication Mobility status        Time: 8884-8871 OT Time Calculation (min): 13 min  Charges: OT General Charges $OT Visit: 1 Visit OT Treatments $Therapeutic Activity: 8-22 mins  Therisa Sheffield, OTD OTR/L  07/03/24, 1:11 PM

## 2024-07-03 NOTE — Discharge Instructions (Signed)
 NEUROSURGERY DISCHARGE INSTRUCTIONS  Admission diagnosis: Unresponsive [R41.89] Acute subdural hematoma (HCC) [S06.5XAA] Subdural hematoma (HCC) [S06.5XAA]  Operative procedure: Craniectomy for evacuation of SDH  What to do after you leave the hospital:  Recommended diet: regular diet. Increase protein intake to promote wound healing.  Recommended activity: no lifting, driving, or strenuous exercise for 4 weeks. You should walk multiple times per day  Special Instructions  No straining, no heavy lifting > 10lbs x 4 weeks.  Keep incision area clean and dry. May shower in 2 days. No baths or pools for 6 weeks.    You have sutures or staples that will be removed in clinic.   Please take pain medications as directed. Take a stool softener if on pain medications  *Regarding compression stockings-  Please wear day and night until you are walking a couple hundred feet three times a day.   Please Report any of the following: Nausea or Vomiting, Temperature is greater than 101.25F (38.1C) degrees, Dizziness, Abdominal Pain, Difficulty Breathing or Shortness of Breath, Inability to Eat, drink Fluids, or Take medications, Bleeding, swelling, or drainage from surgical incision sites, New numbness or weakness, and Bowel or bladder dysfunction to the neurosurgeon on call. How to contact us :  If you have any questions/concerns before or after surgery, you can reach us  at [phone], or you can send a mychart message. We can be reached by phone or mychart 8am-4pm, Monday-Friday.  *Please note: Calls after 4pm are forwarded to a third party answering service. Mychart messages are not routinely monitored during evenings, weekends, and holidays. Please call our office to contact the answering service for urgent concerns during non-business hours.   Additional Follow up appointments Please follow up with Lyle Decamp as scheduled in 2-3 weeks   Please see below for scheduled appointments:  Future  Appointments  Date Time Provider Department Center  07/15/2024  2:00 PM Decamp Lyle, PA-C [facility] None  08/14/2024  2:30 PM [name] Penne ORN, MD [facility] None  09/23/2024  3:30 PM Decamp Lyle, PA-C [facility] None

## 2024-07-03 NOTE — Plan of Care (Signed)
  Problem: Clinical Measurements: Goal: Will remain free from infection Outcome: Progressing Goal: Diagnostic test results will improve Outcome: Progressing Goal: Respiratory complications will improve Outcome: Progressing Goal: Cardiovascular complication will be avoided Outcome: Progressing   Problem: Nutrition: Goal: Adequate nutrition will be maintained Outcome: Progressing   Problem: Elimination: Goal: Will not experience complications related to urinary retention Outcome: Progressing   Problem: Safety: Goal: Ability to remain free from injury will improve Outcome: Progressing   Problem: Skin Integrity: Goal: Risk for impaired skin integrity will decrease Outcome: Progressing

## 2024-07-03 NOTE — Progress Notes (Signed)
       CROSS COVER NOTE  NAME: [name] MRN: [id number] DOB : 1965-10-14    Concern as stated by nurse / staff   Persistent agitation throughout the night related to EtOH withdrawal currently on phenobarb.  Was on Precedex a couple days ago     Pertinent findings on chart review:   Patient Assessment   Assessment and  Interventions   Assessment:  Agitation and threatening behavior Little improvement after Ativan  dose is given throughout the night right  Plan: Will restart Precedex infusion X X

## 2024-07-03 NOTE — Progress Notes (Signed)
 Physical Therapy Treatment Patient Details Name: Ernest Romero MRN: [id number] DOB: 11-27-1965 Today's Date: 07/03/2024   History of Present Illness Pt is a 58 year old male presenting from jail after found down,  found to have a large left sided SDH with shift s/p left craniectomy 06/30/24    PMH significant for PMH alcohol use disorder, psychiatric disorder    PT Comments  Pt seen for PT tx with co-tx with pt received in bed asleep. Utilized cold wet cloth on forehead & assisted pt to sitting EOB in attempts to increase alertness. Pt keeps eyes closed throughout session, unable to open despite max cuing, but pt verbalizing at times about someone not in the room. Attempted to check fit of pt's helmet but it appears too small - neuro PA made aware. Pt assisted back to bed with BUE mittens donned, sitter in room. Will continue to follow pt acutely to progress mobility as able.    If plan is discharge home, recommend the following: Two people to help with walking and/or transfers;Two people to help with bathing/dressing/bathroom   Can travel by private vehicle        Equipment Recommendations  Other (comment) (TBD in next venue)    Recommendations for Other Services       Precautions / Restrictions Precautions Precautions: Fall Precaution/Restrictions Comments: helmet, ok to mobilize with therapy prior to delivery per neurosurgical team Restrictions Weight Bearing Restrictions Per Provider Order: No     Mobility  Bed Mobility Overal bed mobility: Needs Assistance       Supine to sit: HOB elevated, Used rails, +2 for physical assistance, +2 for safety/equipment, Max assist Sit to supine: Max assist, +2 for physical assistance, +2 for safety/equipment, HOB elevated, Used rails        Transfers                        Ambulation/Gait                   Stairs             Wheelchair Mobility     Tilt Bed    Modified Rankin (Stroke Patients Only)        Balance Overall balance assessment: Needs assistance Sitting-balance support: Feet supported Sitting balance-Leahy Scale: Poor   Postural control: Left lateral lean                                  Communication Communication Communication: Impaired Factors Affecting Communication: Reduced clarity of speech  Cognition Arousal: Lethargic     PT - Cognitive impairments: No family/caregiver present to determine baseline                   Rancho Levels of Cognitive Functioning Rancho Mirant Scales of Cognitive Functioning: Confused/Agitated: Maximal Assistance Rancho Mirant Scales of Cognitive Functioning: Confused/Agitated: Maximal Assistance [IV]   Following commands: Impaired Following commands impaired: Follows one step commands inconsistently    Cueing Cueing Techniques: Verbal cues, Gestural cues, Tactile cues, Visual cues  Exercises      General Comments        Pertinent Vitals/Pain Pain Assessment Pain Assessment: CPOT Facial Expression: Relaxed, neutral Body Movements: Absence of movements Muscle Tension: Relaxed Compliance with ventilator (intubated pts.): N/A Vocalization (extubated pts.): Talking in normal tone or no sound CPOT Total: 0    Home Living  Prior Function            PT Goals (current goals can now be found in the care plan section) Acute Rehab PT Goals Patient Stated Goal: to walk, to go home PT Goal Formulation: With patient Time For Goal Achievement: 07/16/24 Potential to Achieve Goals: Good Progress towards PT goals: Progressing toward goals    Frequency    Min 3X/week      PT Plan      Co-evaluation PT/OT/SLP Co-Evaluation/Treatment: Yes Reason for Co-Treatment: Complexity of the patient's impairments (multi-system involvement);Necessary to address cognition/behavior during functional activity;To address functional/ADL transfers PT goals addressed  during session: Mobility/safety with mobility;Balance        AM-PAC PT 6 Clicks Mobility   Outcome Measure  Help needed turning from your back to your side while in a flat bed without using bedrails?: A Lot Help needed moving from lying on your back to sitting on the side of a flat bed without using bedrails?: Total Help needed moving to and from a bed to a chair (including a wheelchair)?: Total Help needed standing up from a chair using your arms (e.g., wheelchair or bedside chair)?: Total Help needed to walk in hospital room?: Total Help needed climbing 3-5 steps with a railing? : Total 6 Click Score: 7    End of Session   Activity Tolerance: Patient limited by lethargy;Patient limited by fatigue Patient left: in bed;with call bell/phone within reach;with nursing/sitter in room;with bed alarm set Nurse Communication: Mobility status PT Visit Diagnosis: Other symptoms and signs involving the nervous system (R29.898);Difficulty in walking, not elsewhere classified (R26.2);Muscle weakness (generalized) (M62.81)     Time: 8892-8884 PT Time Calculation (min) (ACUTE ONLY): 8 min  Charges:    $Therapeutic Activity: 8-22 mins PT General Charges $$ ACUTE PT VISIT: 1 Visit                     Richerd Pinal, PT, DPT 07/03/24, 12:29 PM   Richerd CHRISTELLA Pinal 07/03/2024, 12:27 PM

## 2024-07-03 NOTE — Progress Notes (Signed)
   Inpatient Rehabilitation Admissions Coordinator   Rehab consult received. I plan to meet patient onsite today after 4 pm to assess for his rehab venue options.  Heron Leavell, RN, MSN Rehab Admissions Coordinator [phone] 07/03/2024 11:03 AM

## 2024-07-03 NOTE — Progress Notes (Signed)
  Progress Note   Patient: Ernest Romero FMW:[id number] DOB: [birth year] DOA: 06/30/2024     3 DOS: the patient was seen and examined on 07/03/2024   Brief hospital course: 58 y.o. male who presented to [facility] ED for evaluation of a subdural hematoma. [facility] jail for the evaluation of unresponsiveness. Previous Head CT has shown small subdural hematomas over the past few weeks  According to the jail staff, the patient had been incarcerated for approximately 12 hours prior to this acute change in mental status. There were no signs to suggest trauma, injuries or falls.  Due to AMS, concern for airway protection and respiratory failure, pt was intubated in ER. CT with left SDH with significant midline shift, s/p left fronto parietal craniotomy by Neurosurgery. Hospital course as below  Assessment and Plan: Subdural hematoma (HCC) - Status post left frontal parietal craniotomy with evacuation of subdural hematoma and placement of a drain.  Drain removed on 9/29.   - Neurosurgical team placed an order for helmet.   - Continue working with PT and OT - Keppra discontinued as patient is on phenobarbital   Alcohol withdrawal (HCC) - Continue thiamine , folic acid  and phenobarbital taper - Ativan  prn, dose lowered as patient was very drowsy overnight   Malnutrition of moderate degree - Continue supplements   Bipolar disorder (HCC) - Critical care team consulted psychiatry and placed on lithium  and Latuda    Essential hypertension - Continue to monitor   Hypophosphatemia - Repleted  PT/OT recommend Inpatient rehab    Subjective: Patient was examined at the bedside, very drowsy, answering simple questions Moving all extremities. Will lower the dose of Ativan  prn as pt received IV 4mg  overnight  Physical Exam: Vitals:   07/03/24 0600 07/03/24 0700 07/03/24 0840 07/03/24 0900  BP: (!) 150/96 (!) 156/100 (!) 148/92 (!) 133/95  Pulse:  84 72 74  Resp: (!) 21 (!) 23 (!) 21  20  Temp:      TempSrc:      SpO2:  100% 100% 100%  Weight:      Height:       HENT:     Mouth/Throat:     Pharynx: No oropharyngeal exudate.  Eyes:     General: Lids are normal.  Cardiovascular:     Rate and Rhythm: Normal rate and regular rhythm.     Heart sounds: Normal heart sounds, S1 normal and S2 normal.  Pulmonary:     Breath sounds: No decreased breath sounds, wheezing, rhonchi or rales.  Abdominal:     Palpations: Abdomen is soft.     Tenderness: There is no abdominal tenderness.  Musculoskeletal:     Right lower leg: No swelling.     Left lower leg: No swelling.  Skin:    General: Skin is warm.     Findings: No rash.  Neurological:     Mental Status: Lethargic    Comments: Able to move all extremities   Data Reviewed: Labs and imaging reviewed  Family Communication: No contacts in computer  Disposition: Status is: Inpatient Remains inpatient appropriate because: Continue working with PT and OT.  Continue phenobarbital taper  Planned Discharge Destination: Rehab  Time spent: 35 minutes  Author: Laree Lock, MD 07/03/2024 10:43 AM  For on call review [email or web address].

## 2024-07-03 NOTE — Progress Notes (Signed)
 Patient was resting when psychiatry team attempted to assess patient. Patient unable to participate in assessment at this time. It is noted patient was prescribed lithium  and latuda  by another provider- appears compliant with medications per [facility] review.

## 2024-07-03 NOTE — Progress Notes (Signed)
   Inpatient Rehabilitation Admissions Coordinator   Met at bedside with patient and his nurse. Patient restless and requesting mitts to be removed. I asked about who to call for family to let them know that he was in this situation. No name given. I reviewed EMS run sheets over the past few months. His listed address [address], , is a home he sold 02/2024. His [place] address of [address], office is closed at present. I will contact apartment for [facility] as well as [facility] tomorrow to attempt to locate [facility]. Recommend acute team continue to attempt to locate [facility] as well . I am not pursuing CIR admit without a disposition.  Heron Leavell, RN, MSN Rehab Admissions Coordinator [phone] 07/03/2024 6:24 PM

## 2024-07-04 ENCOUNTER — Telehealth: Payer: Self-pay

## 2024-07-04 DIAGNOSIS — S065XAA Traumatic subdural hemorrhage with loss of consciousness status unknown, initial encounter: Secondary | ICD-10-CM

## 2024-07-04 LAB — RENAL FUNCTION PANEL
Albumin: 3.1 g/dL — ABNORMAL LOW (ref 3.5–5.0)
Anion gap: 6 (ref 5–15)
BUN: 9 mg/dL (ref 6–20)
CO2: 26 mmol/L (ref 22–32)
Calcium: 8.8 mg/dL — ABNORMAL LOW (ref 8.9–10.3)
Chloride: 108 mmol/L (ref 98–111)
Creatinine, Ser: 0.88 mg/dL (ref 0.61–1.24)
GFR, Estimated: >60 mL/min (ref >60)
Glucose, Bld: 104 mg/dL — ABNORMAL HIGH (ref 70–99)
Phosphorus: 3.4 mg/dL (ref 2.5–4.6)
Potassium: 3.6 mmol/L (ref 3.5–5.1)
Sodium: 140 mmol/L (ref 135–145)

## 2024-07-04 LAB — CBC
HCT: 34.2 % — ABNORMAL LOW (ref 39.0–52.0)
Hemoglobin: 11.1 g/dL — ABNORMAL LOW (ref 13.0–17.0)
MCH: 29.7 pg (ref 26.0–34.0)
MCHC: 32.5 g/dL (ref 30.0–36.0)
MCV: 91.4 fL (ref 80.0–100.0)
Platelets: 225 K/uL (ref 150–400)
RBC: 3.74 MIL/uL — ABNORMAL LOW (ref 4.22–5.81)
RDW: 13.3 % (ref 11.5–15.5)
WBC: 6.6 K/uL (ref 4.0–10.5)
nRBC: 0 % (ref 0.0–0.2)

## 2024-07-04 LAB — GLUCOSE, CAPILLARY
Glucose-Capillary: 113 mg/dL — ABNORMAL HIGH (ref 70–99)
Glucose-Capillary: 106 mg/dL — ABNORMAL HIGH (ref 70–99)
Glucose-Capillary: 120 mg/dL — ABNORMAL HIGH (ref 70–99)
Glucose-Capillary: 134 mg/dL — ABNORMAL HIGH (ref 70–99)
Glucose-Capillary: 105 mg/dL — ABNORMAL HIGH (ref 70–99)
Glucose-Capillary: 115 mg/dL — ABNORMAL HIGH (ref 70–99)

## 2024-07-04 LAB — BPAM RBC
Blood Product Expiration Date: 202510262359
Unit Type and Rh: 5100

## 2024-07-04 LAB — TYPE AND SCREEN
ABO/RH(D): O NEG
Antibody Screen: NEGATIVE
Unit division: 0

## 2024-07-04 LAB — MAGNESIUM: Magnesium: 2.3 mg/dL (ref 1.7–2.4)

## 2024-07-04 MED ORDER — POLYETHYLENE GLYCOL 3350 17 G PO PACK
17.0000 g | PACK | Freq: Every day | ORAL | Status: DC | PRN
  Administered 2024-07-07: 17 g via ORAL
  Filled 2024-07-04: qty 1

## 2024-07-04 NOTE — Telephone Encounter (Signed)
 Per discussion with Ernest Romero, this patient needs a head CT (on/around 08/05/24) prior to his 6 week post-op appointment. I have placed the order for this.  Lanette, when you send a message to radiology scheduling, please include a note that the CT needs to be done with MEDCAD protocol (thin cuts for future cranioplasty).  *At the time of this message, he is currently admitted.Ernest Romero, can you please make sure he is aware of the need to get his CT scan the week prior to his 6 post-op appointment? Radiology will likely not schedule this until he is discharged.  Thank you all!

## 2024-07-04 NOTE — Progress Notes (Signed)
   Neurosurgery Progress Note  History: Ernest Romero is s/p left craniectomy for SDH  POD4: Pt remains intermittently agitated  POD3: Concerns overnight for EtOH withdrawal.  POD2:  Pt extubated yesterday. Pt complaining about mitts this morning. Denying headache  POD1: Pt reported to be following commands when off sedation.   Physical Exam: Vitals:   07/04/24 0600 07/04/24 0700  BP: (!) 153/104 (!) 139/94  Pulse: 66 (!) 59  Resp: 19 (!) 23  Temp:    SpO2: 100% 100%   PERRL  Oriented to self only and place. MAEW.  Incision covered with clean dressing. Craniectmy site is soft.   Data:  Other tests/results:  CT head 9/28  FINDINGS:   BRAIN AND VENTRICLES: Left frontoparietal craniotomy was performed. The subdural hematoma was evacuated. A drain is in place. Minimal residual blood products remain in the extra-axial space. Pneumocephalus is present. Midline shift is improved now measuring 9 mm. No new hemorrhage is present. No evidence of acute infarct. No hydrocephalus.   ORBITS: No acute abnormality.   SINUSES: No acute abnormality.   SOFT TISSUES AND SKULL: Left frontoparietal craniotomy was performed. A drain is in place. No acute soft tissue abnormality. No skull fracture.   IMPRESSION: 1. Status post left frontoparietal craniotomy with evacuation of subdural hematoma and placement of a drain. 2. Minimal residual blood products in the extra-axial space and pneumocephalus. 3. Improved midline shift, now measuring 9 mm. 4. No new hemorrhage.  Assessment/Plan:  Ernest Romero is a 58 y.o presenting with AMS found to have a large left sided SDH with shift s/p left craniectomy.  - mental status continues to wax and wane but consistent with polypharmacy and withdrawal.  - Drain removed 9/29.  - will continue to monitor exam - helmet ordered - d/c Keppra given phenobarbital.  - remainder of care per CC team  Edsel Goods PA-C Department of  Neurosurgery

## 2024-07-04 NOTE — TOC Progression Note (Signed)
 Transition of Care [facility]) - Progression Note    Patient Details  Name: [place] WENZLICK MRN: [id number] Date of Birth: [birth year]  Transition of Care [facility]) CM/SW Contact  K'La JINNY Ruts, LCSW Phone Number: 07/04/2024, 3:40 PM  Clinical Narrative:    Chart reviewed. I made a report with Ryan Cardinal at [facility]. Mr. Cardinal let me know a decision will be made by tomorrow.                      Expected Discharge Plan and Services                                               Social Drivers of Health (SDOH) Interventions SDOH Screenings   Food Insecurity: Patient Declined (07/02/2024)  Housing: Patient Unable To Answer (07/02/2024)  Transportation Needs: Patient Declined (07/02/2024)  Utilities: Patient Unable To Answer (07/02/2024)  Alcohol Screen: Low Risk  (08/30/2022)  Depression (PHQ2-9): Medium Risk (08/30/2022)  Tobacco Use: Medium Risk (06/30/2024)    Readmission Risk Interventions     No data to display

## 2024-07-04 NOTE — Progress Notes (Signed)
 Progress Note   Patient: Ernest Romero FMW:[id number] DOB: 09-Feb-1966 DOA: 06/30/2024     4 DOS: the patient was seen and examined on 07/04/2024   Brief hospital course: 58 y.o. male who presented to [facility] ED for evaluation of a subdural hematoma. Patient presented from local jail for the evaluation of unresponsiveness. Previous Head CT has shown small subdural hematomas over the past few weeks  According to the jail staff, the patient had been incarcerated for approximately 12 hours prior to this acute change in mental status. There were no signs to suggest trauma, injuries or falls.  Due to AMS, concern for airway protection and respiratory failure, pt was intubated in ER. CT with left SDH with significant midline shift, s/p left fronto parietal craniotomy by Neurosurgery. Hospital course as below  Assessment and Plan: Subdural hematoma (HCC) - Status post left frontal parietal craniotomy with evacuation of subdural hematoma and placement of a drain.  Drain removed on 9/29.   - Neurosurgical team placed an order for helmet.   - Continue working with PT and OT - Keppra discontinued as patient is on phenobarbital   Alcohol withdrawal (HCC) - Continue thiamine , folic acid  and phenobarbital taper - Ativan  prn, dose lowered as patient was very drowsy overnight  Agitation - Suspect behavioral/agitation likely due to acute alcohol withdrawal - May also have some changes due to recent craniotomy, underlying bipolar disorder - SLP following for cognitive/lang eval - Has 1:1 sitter - On phenobarbital taper and Ativan  as needed as needed as above   Malnutrition of moderate degree - Continue supplements   Bipolar disorder (HCC) - Consulted psychiatry and placed on lithium  and Latuda    Essential hypertension - Continue to monitor   Hypophosphatemia - Repleted  Social determinants - Due to acute changes patient does not have decision-making capacity at this time, unknown POA/next of kin -  Unclear how much patient will improve and what his new baseline will be - TOC aware  PT/OT recommend Inpatient rehab    Subjective: Patient was examined at the bedside, continues to be drowsy but better than yesterday Moving all extremities.  Intermittently more awake, agitated and trying to get out of bed.   Physical Exam: Vitals:   07/04/24 1254 07/04/24 1300 07/04/24 1400 07/04/24 1500  BP: (!) 143/101 (!) 154/100 (!) 140/126 (!) 149/86  Pulse: 63 64 86 85  Resp: 15 19 (!) 25 19  Temp: (!) 97.5 F (36.4 C)     TempSrc: Axillary     SpO2: 99% 100% 98% 100%  Weight:      Height:       HENT:     Mouth/Throat:     Pharynx: No oropharyngeal exudate.  Eyes:     General: Lids are normal.  Cardiovascular:     Rate and Rhythm: Normal rate and regular rhythm.     Heart sounds: Normal heart sounds, S1 normal and S2 normal.  Pulmonary:     Breath sounds: No decreased breath sounds, wheezing, rhonchi or rales.  Abdominal:     Palpations: Abdomen is soft.     Tenderness: There is no abdominal tenderness.  Musculoskeletal:     Right lower leg: No swelling.     Left lower leg: No swelling.  Skin:    General: Skin is warm.     Findings: No rash.  Neurological:     Mental Status: Lethargic    Comments: Able to move all extremities   Data Reviewed: Labs and imaging reviewed  Family Communication: No contacts in computer  Disposition: Status is: Inpatient Remains inpatient appropriate because: Continue working with PT and OT.  Continue phenobarbital taper  Planned Discharge Destination: Rehab  Time spent: 35 minutes  Author: Laree Lock, MD 07/04/2024 5:58 PM  For on call review [email or web address].

## 2024-07-04 NOTE — Progress Notes (Signed)
 SLP Cancellation Note  Patient Details Name: Ernest Romero MRN: [id number] DOB: Jul 15, 1966   Cancelled treatment:       Reason Eval/Treat Not Completed: Fatigue/lethargy limiting ability to participate  Orders received for cog/ling eval in the setting of left craniectomy for SDH. Pt with noted agitation overnight requiring ativan , now sleeping in bed, comfortable/calm. SLP will follow up when pt is alert.   Given overall medical picture, expect that current cognitive presentation is multi factorial- impacted by acute neuro injury and polypharmacy and withdrawal. SLP will follow up in next 1-3 days to aid identification of current cognitive ability.   [name], MS, CCC-SLP Speech Language Pathologist Rehab Services; [facility] [phone] (ascom)   [name] 07/04/2024, 12:44 PM

## 2024-07-04 NOTE — Progress Notes (Signed)
   Inpatient Rehabilitation Admissions Coordinator   I contacted [facility] in [place] [phone] and spoke with [place]. He does not have this patient as a Psychologist, educational at [address] I contacted a Conservator, museum/gallery at [facility] jail [phone]. They have last known address as [address] [place]. That home has been sold 5/25 and this patient known to be likely homeless. Clarise has number listed [phone]. Attempted to call and that voicemail has not been set up and not replying by text . I am not pursuing CIR admit and other rehab venues should be pursued. Acute team and TOC made aware. We will sign off at this time.  Heron Leavell, RN, MSN Rehab Admissions Coordinator [phone] 07/04/2024 10:04 AM

## 2024-07-04 NOTE — Progress Notes (Signed)
 Occupational Therapy Treatment Patient Details Name: Ernest Romero MRN: [id number] DOB: March 17, 1966 Today's Date: 07/04/2024   History of present illness Pt is a 58 year old male presenting from jail after found down,  found to have a large left sided SDH with shift s/p left craniectomy 06/30/24    PMH significant for PMH alcohol use disorder, psychiatric disorder   OT comments  Chart reviewed to date, pt greeted with PT already in room. He is labile and restless at times but does follow one step directive with multi modal cues and can be re-directed. He is tangential and asking to talk to his peer counselor, team notified. Tx session targeted improving functional activity tolerance in prep for ADL tasks. New helmet donned sitting on edge of bed with appropriate fit. Pt amb 10' and 20' via HHA with MOD A +2, step by step multi modal cues. He feeds himself with MIN A. Good grip strength B hands, will continue to assess. Less restlessness noted following mobility attempts. Educated tech to [employer], assist with mobility with gait belt, +2 if pt appears restless and it is appropriate at that time as it appears to continue to calm patient. Pt is left semi supine in bed, all needs met, OT will continue to follow.       If plan is discharge home, recommend the following:  A lot of help with bathing/dressing/bathroom;A lot of help with walking and/or transfers;Supervision due to cognitive status   Equipment Recommendations  Other (comment) (defer)    Recommendations for Other Services      Precautions / Restrictions Precautions Precautions: Fall Recall of Precautions/Restrictions: Impaired Precaution/Restrictions Comments: helmet, ok to mobilize with therapy prior to delivery per neurosurgical team; Restrictions Weight Bearing Restrictions Per Provider Order: No       Mobility Bed Mobility Overal bed mobility: Needs Assistance Bed Mobility: Supine to Sit, Sit to Supine     Supine to sit:  Mod assist Sit to supine: Mod assist   General bed mobility comments: step by step multi modal cues for technique    Transfers Overall transfer level: Needs assistance Equipment used: 2 person hand held assist   Sit to Stand: Min assist, +2 safety/equipment, Mod assist                 Balance Overall balance assessment: Needs assistance Sitting-balance support: Feet supported Sitting balance-Leahy Scale: Fair     Standing balance support: Bilateral upper extremity supported Standing balance-Leahy Scale: Poor                             ADL either performed or assessed with clinical judgement   ADL Overall ADL's : Needs assistance/impaired Eating/Feeding: Minimal assistance;Bed level   Grooming: Maximal assistance;Bed level           Upper Body Dressing : Maximal assistance Upper Body Dressing Details (indicate cue type and reason): donn helmet Lower Body Dressing: Maximal assistance   Toilet Transfer: Moderate assistance;Ambulation;Cueing for sequencing;Cueing for safety Toilet Transfer Details (indicate cue type and reason): +2 via HHA         Functional mobility during ADLs: Moderate assistance (approx 10', then 20' MOD A +2 via HHA with close chair follow)      Extremity/Trunk Assessment Upper Extremity Assessment Upper Extremity Assessment: Difficult to assess due to impaired cognition            Vision       Perception  Praxis     Communication Communication Communication: Impaired Factors Affecting Communication: Reduced clarity of speech   Cognition Arousal: Lethargic Behavior During Therapy: Lability, Impulsive, Restless Cognition: No family/caregiver present to determine baseline, Cognition impaired, [place]o level   Orientation impairments: Place, Time, Situation (trying to go get his phone that is in the next room) Awareness: Intellectual awareness impaired, Online awareness impaired Memory impairment (select all  impairments): Short-term memory, Declarative long-term memory, Non-declarative long-term memory Attention impairment (select first level of impairment): Focused attention Executive functioning impairment (select all impairments): Initiation, Organization, Sequencing, Reasoning, Problem solving                 [place] [employer] Scales of Cognitive Functioning: Confused/Agitated: Maximal Assistance [IV] Following commands: Impaired Following commands impaired: Follows one step commands inconsistently, Follows one step commands with increased time (and multi modal cues)      Cueing   Cueing Techniques: Verbal cues, Gestural cues, Tactile cues, Visual cues  Exercises Other Exercises Other Exercises: edu re role of OT, role of rehab Other Exercises: pt asking to contact Norleen Rous his peer advisor, team notified    Shoulder Instructions       General Comments vss    Pertinent Vitals/ Pain       Pain Assessment Pain Assessment: CPOT Facial Expression: Relaxed, neutral Body Movements: Absence of movements Muscle Tension: Relaxed Compliance with ventilator (intubated pts.): N/A Vocalization (extubated pts.): Sighing, moaning CPOT Total: 1 Pain Intervention(s): Monitored during session  Home Living                                          Prior Functioning/Environment              Frequency  Min 3X/week        Progress Toward Goals  OT Goals(current goals can now be found in the care plan section)     Acute Rehab OT Goals Time For Goal Achievement: 07/16/24  Plan      Co-evaluation    PT/OT/SLP Co-Evaluation/Treatment: Yes Reason for Co-Treatment: Complexity of the patient's impairments (multi-system involvement);Necessary to address cognition/behavior during functional activity;To address functional/ADL transfers   OT goals addressed during session: ADL's and self-care;Proper use of Adaptive equipment and DME      AM-PAC OT 6  Clicks Daily Activity     Outcome Measure   Help from another person eating meals?: A Little Help from another person taking care of personal grooming?: A Lot Help from another person toileting, which includes using toliet, bedpan, or urinal?: A Lot Help from another person bathing (including washing, rinsing, drying)?: A Lot Help from another person to put on and taking off regular upper body clothing?: A Lot Help from another person to put on and taking off regular lower body clothing?: A Lot 6 Click Score: 13    End of Session    OT Visit Diagnosis: Other abnormalities of gait and mobility (R26.89);Muscle weakness (generalized) (M62.81);Other symptoms and signs involving the nervous system (R29.898);Cognitive communication deficit (R41.841);Other symptoms and signs involving cognitive function   Activity Tolerance Patient tolerated treatment well   Patient Left in bed;with call bell/phone within reach;with bed alarm set;with nursing/sitter in room   Nurse Communication Mobility status        Time: 1351-1419 OT Time Calculation (min): 28 min  Charges: OT General Charges $OT Visit: 1 Visit OT Treatments $Therapeutic Activity:  8-22 mins  Therisa Sheffield, OTD OTR/L  07/04/24, 4:18 PM

## 2024-07-04 NOTE — Progress Notes (Signed)
 Physical Therapy Treatment Patient Details Name: Ernest Romero MRN: [id number] DOB: 1965-10-17 Today's Date: 07/04/2024   History of Present Illness Pt is a 58 year old male presenting from jail after found down,  found to have a large left sided SDH with shift s/p left craniectomy 06/30/24    PMH significant for PMH alcohol use disorder, psychiatric disorder    PT Comments  Pt seen for PT tx with co-tx with OT. Pt received asleep in bed with LE over EOB. Pt does awaken & communicates with therapist but requires encouragement to open eyes. Pt requesting phone to listen to music, reporting he likes [employer] S[employer]; PT played music for pt during session. Pt unaware of deficits, reporting he can walk. Pt is able to ambulate with HHA +2 & chair follow for safety. Pt requesting drink but requires assistance to accurately bring straw to mouth. Pt tolerates wearing helmet throughout session. Assisted pt back to bed at end of session. Recommend ongoing PT services while in acute setting & post acute rehab >3 hours therapy/day upon d/c.   If plan is discharge home, recommend the following: Two people to help with walking and/or transfers;Two people to help with bathing/dressing/bathroom   Can travel by private vehicle        Equipment Recommendations  Other (comment) (TBD)    Recommendations for Other Services       Precautions / Restrictions Precautions Precautions: Fall Recall of Precautions/Restrictions: Impaired Precaution/Restrictions Comments: helmet, ok to mobilize with therapy prior to delivery per neurosurgical team Restrictions Weight Bearing Restrictions Per Provider Order: No     Mobility  Bed Mobility Overal bed mobility: Needs Assistance Bed Mobility: Supine to Sit     Supine to sit: Mod assist, HOB elevated (exit R side of bed) Sit to supine: Max assist        Transfers Overall transfer level: Needs assistance Equipment used: 1 person hand held assist Transfers: Sit  to/from Stand Sit to Stand: Min assist, +2 safety/equipment, Mod assist           General transfer comment: sit>stand from EOB, recliner    Ambulation/Gait Ambulation/Gait assistance: Mod assist Gait Distance (Feet): 20 Feet (+ 10 ft) Assistive device: 2 person hand held assist (+ chair follow for safety) Gait Pattern/deviations: Decreased step length - right, Decreased step length - left, Decreased stride length, Decreased dorsiflexion - left, Decreased dorsiflexion - right Gait velocity: decreased     General Gait Details: inconsistent step length & width, decreased LLE step length, decreased LLE heel strike & dorsiflexion, dragging LLE intermittently   Stairs             Wheelchair Mobility     Tilt Bed    Modified Rankin (Stroke Patients Only)       Balance Overall balance assessment: Needs assistance Sitting-balance support: Feet supported Sitting balance-Leahy Scale: Fair Sitting balance - Comments: CGA static sitting EOB   Standing balance support: Bilateral upper extremity supported Standing balance-Leahy Scale: Poor                              Communication Communication Communication: Impaired Factors Affecting Communication: Reduced clarity of speech  Cognition Arousal: Lethargic Behavior During Therapy: Lability, Impulsive, Restless   PT - Cognitive impairments: No family/caregiver present to determine baseline                   [place] Levels of Cognitive Functioning [place] [address]  Scales of Cognitive Functioning: Confused/Agitated: Maximal Assistance [facility] Scales of Cognitive Functioning: Confused/Agitated: Maximal Assistance [IV] PT - Cognition Comments: oriented to self, labile at times, perseverative on phone, easily irritated but also easily redirected Following commands: Impaired Following commands impaired: Follows one step commands inconsistently, Follows one step commands with increased time     Cueing Cueing Techniques: Verbal cues, Gestural cues, Tactile cues, Visual cues  Exercises      General Comments General comments (skin integrity, edema, etc.): VSS; does c/o chest pain at end of session but unable to describe, reports it feels better once back in bed - nurse made aware      Pertinent Vitals/Pain Pain Assessment Pain Assessment: CPOT Facial Expression: Relaxed, neutral Body Movements: Absence of movements Muscle Tension: Relaxed Compliance with ventilator (intubated pts.): N/A Vocalization (extubated pts.): Talking in normal tone or no sound CPOT Total: 0    Home Living                          Prior Function            PT Goals (current goals can now be found in the care plan section) Acute Rehab PT Goals Patient Stated Goal: to walk, to go home PT Goal Formulation: With patient Time For Goal Achievement: 07/16/24 Potential to Achieve Goals: Fair Progress towards PT goals: Progressing toward goals    Frequency    Min 3X/week      PT Plan      Co-evaluation PT/OT/SLP Co-Evaluation/Treatment: Yes Reason for Co-Treatment: Complexity of the patient's impairments (multi-system involvement);Necessary to address cognition/behavior during functional activity;To address functional/ADL transfers PT goals addressed during session: Mobility/safety with mobility;Balance OT goals addressed during session: ADL's and self-care;Proper use of Adaptive equipment and DME      AM-PAC PT 6 Clicks Mobility   Outcome Measure  Help needed turning from your back to your side while in a flat bed without using bedrails?: A Lot Help needed moving from lying on your back to sitting on the side of a flat bed without using bedrails?: Total Help needed moving to and from a bed to a chair (including a wheelchair)?: Total Help needed standing up from a chair using your arms (e.g., wheelchair or bedside chair)?: Total Help needed to walk in hospital room?:  Total Help needed climbing 3-5 steps with a railing? : Total 6 Click Score: 7    End of Session   Activity Tolerance: Patient tolerated treatment well;Patient limited by fatigue Patient left: in bed;with call bell/phone within reach;with bed alarm set;with nursing/sitter in room (set up with meal tray) Nurse Communication: Mobility status (c/o chest pain x 1 time during session) PT Visit Diagnosis: Other symptoms and signs involving the nervous system (R29.898);Difficulty in walking, not elsewhere classified (R26.2);Muscle weakness (generalized) (M62.81);Other abnormalities of gait and mobility (R26.89);Unsteadiness on feet (R26.81)     Time: 8645-8582 PT Time Calculation (min) (ACUTE ONLY): 23 min  Charges:    $Neuromuscular Re-education: 8-22 mins PT General Charges $$ ACUTE PT VISIT: 1 Visit                     Richerd Pinal, PT, DPT 07/04/24, 4:43 PM   Richerd CHRISTELLA Pinal 07/04/2024, 4:42 PM

## 2024-07-04 NOTE — Telephone Encounter (Signed)
-----   Message from Edsel Jama Goods sent at 07/04/2024  6:52 PM EDT ----- Regarding: RE: head CT prior to 6 week post op Sorry I just assumed you were since I never really order those. I would have Brooke or claudene do it so the results go to them ----- Message ----- From: Kiaira Pointer, RN Sent: 07/04/2024   6:51 PM EDT To: Edsel Jama Goods, PA; Cns-Neurosurgery Rn Subject: head CT prior to 6 week post op                Are you placing the order for the 6 week head CT with thin cuts? I didn't see one ordered, but it sounded like you were. I'm happy to do it, but didn't want us  both to do it.

## 2024-07-04 NOTE — TOC Progression Note (Signed)
 Transition of Care [facility]) - Progression Note    Patient Details  Name: Ernest Romero MRN: [id number] Date of Birth: 08-Jun-1966  Transition of [facility]) CM/SW Contact  K'La JINNY Ruts, LCSW Phone Number: 07/04/2024, 3:21 PM  Clinical Narrative:    Chart reviewed. I called [facility] to make a report. I left a voicemail for them to return call at earliest convenience.                       Expected Discharge Plan and Services                                               Social Drivers of Health (SDOH) Interventions SDOH Screenings   Food Insecurity: Patient Declined (07/02/2024)  Housing: Patient Unable To Answer (07/02/2024)  Transportation Needs: Patient Declined (07/02/2024)  Utilities: Patient Unable To Answer (07/02/2024)  Alcohol Screen: Low Risk  (08/30/2022)  Depression (PHQ2-9): Medium Risk (08/30/2022)  Tobacco Use: Medium Risk (06/30/2024)    Readmission Risk Interventions     No data to display

## 2024-07-05 LAB — MAGNESIUM: Magnesium: 2.5 mg/dL — ABNORMAL HIGH (ref 1.7–2.4)

## 2024-07-05 LAB — GLUCOSE, CAPILLARY
Glucose-Capillary: 122 mg/dL — ABNORMAL HIGH (ref 70–99)
Glucose-Capillary: 101 mg/dL — ABNORMAL HIGH (ref 70–99)
Glucose-Capillary: 125 mg/dL — ABNORMAL HIGH (ref 70–99)
Glucose-Capillary: 201 mg/dL — ABNORMAL HIGH (ref 70–99)
Glucose-Capillary: 138 mg/dL — ABNORMAL HIGH (ref 70–99)

## 2024-07-05 LAB — BASIC METABOLIC PANEL WITH GFR
Anion gap: 7 (ref 5–15)
BUN: 14 mg/dL (ref 6–20)
CO2: 28 mmol/L (ref 22–32)
Calcium: 9.3 mg/dL (ref 8.9–10.3)
Chloride: 105 mmol/L (ref 98–111)
Creatinine, Ser: 0.88 mg/dL (ref 0.61–1.24)
GFR, Estimated: >60 mL/min (ref >60)
Glucose, Bld: 107 mg/dL — ABNORMAL HIGH (ref 70–99)
Potassium: 3.9 mmol/L (ref 3.5–5.1)
Sodium: 140 mmol/L (ref 135–145)

## 2024-07-05 MED ORDER — AMLODIPINE BESYLATE 5 MG PO TABS
5.0000 mg | ORAL_TABLET | Freq: Every day | ORAL | Status: DC
  Administered 2024-07-05 – 2024-07-15 (×11): 5 mg via ORAL
  Filled 2024-07-05 (×11): qty 1

## 2024-07-05 MED ORDER — ORAL CARE MOUTH RINSE: 15.0000 mL | OROMUCOSAL | Status: DC | PRN

## 2024-07-05 NOTE — Telephone Encounter (Signed)
 A copy of the [facility] protocol has been scanned in his chart under the media tab.

## 2024-07-05 NOTE — Progress Notes (Signed)
 Called OR to search for patient belongings.No belongings found. OR staff suggested that belongings were most likely left with officers who escorted patient.

## 2024-07-05 NOTE — Progress Notes (Signed)
 [employer] booking sheet found in pt's chart. Office called and several extensions tried unsuccessfully as none of the calls were answered. Pt informed, will try again later.

## 2024-07-05 NOTE — Telephone Encounter (Signed)
 Noted

## 2024-07-05 NOTE — Progress Notes (Addendum)
 This RN managed to contact the [facility] Office at [phone]. Pt booking information supplied to officer who went to check for pt's belongings. RN informed that Sheriff's office did not have pt's belongings and that they should have been transferred with the pt. They would continue to search and reach out to day nurse if belongings found. Also, RN informed that the team that released him into hospital care would be in on Monday and should be able to tell exactly what was done with pt belongings. RN proceeded to call the ED to see if anything had been left. ED RN searched lost and found but reports no wallets or cell phones. Pt is reporting that he is missing cellphone, wallet with credit cards and ID, and clothes.  [employer] Sherrif's Office: [phone]

## 2024-07-05 NOTE — Evaluation (Signed)
 Speech Language Pathology Evaluation Patient Details Name: Ernest Romero MRN: [id number] DOB: 01/25/1966 Today's Date: 07/05/2024 Time: 8774-8764 SLP Time Calculation (min) (ACUTE ONLY): 10 min  Problem List:  Patient Active Problem List   Diagnosis Date Noted   Malnutrition of moderate degree 07/02/2024   Alcohol withdrawal (HCC) 07/02/2024   Hypophosphatemia 07/02/2024   Subdural hematoma (HCC) 06/30/2024   Behavior concern in adult 04/21/2024   Drug-seeking behavior 03/02/2024   Polysubstance abuse (HCC) 03/02/2024   Delusions (HCC) 04/15/2023   Cocaine abuse with cocaine-induced psychotic disorder, with delusions (HCC) 04/15/2023   Overweight with body mass index (BMI) of 28 to 28.9 in adult 08/30/2022   Essential hypertension 09/17/2018   Bipolar disorder (HCC) 04/22/2013   Past Medical History:  Past Medical History:  Diagnosis Date   Anxiety    Bipolar 1 disorder (HCC)    Depression    Hepatitis    Hypertension    Past Surgical History:  Past Surgical History:  Procedure Laterality Date   CRANIOTOMY Left 06/30/2024   Procedure: CRANIOTOMY HEMATOMA EVACUATION SUBDURAL;  Surgeon: Melonie Grass, MD;  Location: [facility] ORS;  Service: Neurosurgery;  Laterality: Left;   HPI:  Ernest Romero is a 58 year old male presenting from jail after found down,  found to have a large left sided SDH with shift s/p left craniectomy 06/30/24    PMH significant for PMH alcohol use disorder, psychiatric disorder   Assessment / Plan / Recommendation Clinical Impression  Ernest Romero presents with behaviors commensurate with Ranch Level IV - Confused and Agitated, requiring maximal assistance from staff. As such, he is oriented consistently to himself and during this evaluation was oriented to Ernest Romero, Ernest Romero with no orientation to current situation. Deficits also noted in basic problem solving, self-monitoring, decreased cooperation with this Clinical research associate as he turned his back and stated I am going to  sleep now. While verbal communication and speech intelligibility were a strength Ernest Romero is perseverative and continually asking for a cigarette. ST services to follow for treatment in the hopes of cognitive progress.    SLP Assessment  SLP Recommendation/Assessment: Patient needs continued Speech Language Pathology Services SLP Visit Diagnosis: Attention and concentration deficit;Frontal lobe and executive function deficit;Cognitive communication deficit (R41.841) Attention and concentration deficit following: Nontraumatic intracerebral hemorrhage Frontal lobe and executive function deficit following: Nontraumatic intracerebral hemorrhage     Assistance Recommended at Discharge  Frequent or constant Supervision/Assistance  Functional Status Assessment Patient has had a recent decline in their functional status and/or demonstrates limited ability to make significant improvements in function in a reasonable and predictable amount of time  Frequency and Duration min 2x/week  2 weeks      SLP Evaluation Cognition  Overall Cognitive Status: No family/caregiver present to determine baseline cognitive functioning Arousal/Alertness: Awake/alert Orientation Level: Oriented to person Attention: Sustained Sustained Attention: Impaired Sustained Attention Impairment: Verbal basic;Functional basic Memory: Impaired Memory Impairment: Retrieval deficit;Storage deficit;Decreased recall of new information;Decreased short term memory;Prospective memory Decreased Short Term Memory: Verbal basic;Functional basic Awareness: Impaired Awareness Impairment: Intellectual impairment;Emergent impairment;Anticipatory impairment Problem Solving: Impaired Executive Function:  (all impaired by lower level deficits) Behaviors: Restless;Impulsive;Poor frustration tolerance Safety/Judgment: Impaired Rancho Mirant Scales of Cognitive Functioning: Confused/Agitated: Maximal Assistance       Comprehension  Auditory  Comprehension Overall Auditory Comprehension: Appears within functional limits for tasks assessed    Expression Expression Primary Mode of Expression: Verbal Verbal Expression Overall Verbal Expression: Appears within functional limits for tasks assessed   Oral / Motor  Motor  Speech Overall Motor Speech: Appears within functional limits for tasks assessed           Mikhaila Roh B. Rubbie, M.S., CCC-SLP, Tree surgeon Certified Brain Injury Specialist [facility]  [facility] Office [phone] Ascom [phone] Fax [phone]

## 2024-07-05 NOTE — Progress Notes (Signed)
 Pt insistent that local jails be called in order to find his missing belongings. Pt gave verbal consent for this RN to release his name to jails in order to determine which he came from and whether they have his belongings.  Bonnee Andreas and Haley Savastano, PCTs, at bedside when permission granted.

## 2024-07-05 NOTE — Progress Notes (Signed)
 Progress Note   Patient: Ernest Romero FMW:[id number] DOB: 1966/06/15 DOA: 06/30/2024     5 DOS: the patient was seen and examined on 07/05/2024   Brief hospital course: 58 y.o. male who presented to [facility] ED for evaluation of a subdural hematoma. Patient presented from local jail for the evaluation of unresponsiveness. Previous Head CT has shown small subdural hematomas over the past few weeks  According to the jail staff, the patient had been incarcerated for approximately 12 hours prior to this acute change in mental status. There were no signs to suggest trauma, injuries or falls.  Due to AMS, concern for airway protection and respiratory failure, pt was intubated in ER. CT with left SDH with significant midline shift, s/p left fronto parietal craniotomy by Neurosurgery. Hospital course as below  Assessment and Plan: Subdural hematoma (HCC) - Status post left frontal parietal craniotomy with evacuation of subdural hematoma and placement of a drain.  Drain removed on 9/29.   - Neurosurgical team placed an order for helmet.   - Continue working with PT and OT - Keppra discontinued as patient is on phenobarbital - Repeat CT in 6 weeks as outpatient, Neurosurgery follow up   Alcohol withdrawal (HCC) - Continue thiamine , folic acid  and phenobarbital taper - Ativan  prn, dose lowered as patient was very drowsy  Agitation - Improving - Suspect behavioral/agitation likely due to acute alcohol withdrawal - May also have some changes due to recent craniotomy, underlying bipolar disorder - SLP following for cognitive/lang eval - Has 1:1 sitter - On phenobarbital taper and Ativan  as needed as needed as above   Malnutrition of moderate degree - Continue supplements   Bipolar disorder (HCC) - Consulted psychiatry and placed on lithium  and Latuda    Essential hypertension - Resume Amlodipine    Hypophosphatemia - Repleted  Social determinants - Due to acute changes patient does not have  decision-making capacity at this time, is AO 1-2, states we can reach out to peer support counsellor [place] (?[phone]) - tried calling the number, inaccurate - Unclear how much patient will improve and what his new baseline will be - TOC aware  PT/OT recommend Inpatient rehab    Subjective: Patient was examined at the bedside, continues to be drowsy but significantly better. Is AO 1-2 Tells me his name and its 2025.  States he is at a Science [employer] and asking for his belongings.  Also asked me to call his peer support counsellor, inaccurate number Slowly improving, Moving all extremities. Following commands  Physical Exam: Vitals:   07/05/24 1400 07/05/24 1500 07/05/24 1600 07/05/24 1656  BP: (!) 152/98 (!) 132/91 139/84 (!) 156/104  Pulse: 72 74 79 76  Resp: (!) 21 (!) 21 19 20   Temp:    98 F (36.7 C)  TempSrc:      SpO2: 100% 100% 100% 98%  Weight:      Height:       HENT:     Mouth/Throat:     Pharynx: No oropharyngeal exudate.  Eyes:     General: Lids are normal.  Cardiovascular:     Rate and Rhythm: Normal rate and regular rhythm.     Heart sounds: Normal heart sounds, S1 normal and S2 normal.  Pulmonary:     Breath sounds: No decreased breath sounds, wheezing, rhonchi or rales.  Abdominal:     Palpations: Abdomen is soft.     Tenderness: There is no abdominal tenderness.  Musculoskeletal:     Right lower leg: No swelling.  Left lower leg: No swelling.  Skin:    General: Skin is warm.     Findings: No rash.  Neurological:     Mental Status: Lethargic, but improving    Comments: Able to move all extremities   Data Reviewed: Labs and imaging reviewed  Family Communication: No contacts in computer  Disposition: Status is: Inpatient Remains inpatient appropriate because: Continue working with PT and OT.  Continue phenobarbital taper  Planned Discharge Destination: Rehab  Time spent: 55 minutes  Author: Laree Lock, MD 07/05/2024 5:19  PM  For on call review [email or web address].

## 2024-07-05 NOTE — Consult Note (Signed)
 [facility] Psychiatric Consult Initial  Patient Name: .Ernest Romero  MRN: [id number]  DOB: 12-17-1965  Consult Order details:  Orders (From admission, onward)     Start     Ordered   07/01/24 2119  IP CONSULT TO PSYCHIATRY       Ordering Provider: Jacqulynn Jenita CROME, NP  Provider:  (Not yet assigned)  Question Answer Comment  Location [facility]   Reason for Consult? bipolar disorder, need medication guidance      07/01/24 2118             Mode of Visit: In person    Psychiatry Consult Evaluation  Service Date: July 05, 2024 LOS:  LOS: 5 days  Chief Complaint I've felt better  Primary Psychiatric Diagnoses  Bipolar disorder   Assessment  [name] is a 58 y.o. male admitted: Medically  Patiently is currently admitted to the hospital for subdural hematoma.  On today's exam, patient was lethargic and responding to some assessment questions and then would nod off to sleep.  Unable to perform full assessment today psychiatry will attempt to reassess patient once more alert.  Diagnoses:  Active Hospital problems: Principal Problem:   Subdural hematoma (HCC) Active Problems:   Bipolar disorder (HCC)   Essential hypertension   Malnutrition of moderate degree   Alcohol withdrawal (HCC)   Hypophosphatemia    Plan   ## Psychiatric Medication Recommendations:  -Continue lithium -pending lithium  level -Continue latuda   ## Medical Decision Making Capacity: Not specifically addressed in this encounter  ## Further Work-up:   -- most recent EKG on 06/30/2024 had QtC of 421 -- Pertinent labwork reviewed earlier this admission includes: cbc, bmp,lithium  level pending   ## Disposition:-- Psychiatry will continue to attempt to assess patient when more alert  ## Behavioral / Environmental: -Delirium Precautions: Delirium Interventions for Nursing and Staff: - RN to open blinds every AM. - To Bedside: Glasses, hearing aide, and pt's own  shoes. Make available to patients. when possible and encourage use. - Encourage po fluids when appropriate, keep fluids within reach. - OOB to chair with meals. - Passive ROM exercises to all extremities with AM & PM care. - RN to assess orientation to person, time and place QAM and PRN. - Recommend extended visitation hours with familiar family/friends as feasible. - Staff to minimize disturbances at night. Turn off television when pt asleep or when not in use.    ## Safety and Observation Level:  - Based on my clinical evaluation, I estimate the patient to be at low risk of self harm in the current setting. - At this time, we recommend  routine. This decision is based on my review of the chart including patient's history and current presentation, interview of the patient, mental status examination, and consideration of suicide risk including evaluating suicidal ideation, plan, intent, suicidal or self-harm behaviors, risk factors, and protective factors. This judgment is based on our ability to directly address suicide risk, implement suicide prevention strategies, and develop a safety plan while the patient is in the clinical setting. Please contact our team if there is a concern that risk level has changed.  CSSR Risk Category:   Suicide Risk Assessment: Patient has following modifiable risk factors for suicide: UTA, which we are addressing by will attempt to reassess patient when more alert. Patient has following non-modifiable or demographic risk factors for suicide: male gender Patient has the following protective factors against suicide: UTA  Thank you for this consult  request. Recommendations have been communicated to the primary team.  We will continue to round on patient at this time.   Zelda Sharps, NP        History of Present Illness  Relevant Aspects of [facility]   Patient Report:  Today on assessment, when asked how patient was doing patient quoted I've felt better.   He confirmed diagnosis of bipolar and reported he has been taking lithium  for the past 10 years as well as Latuda .  Patient denied suicidal or homicidal ideations.  He denied any auditory or visual hallucinations.  He reported tolerating current medication regimen well.  He denied any prior inpatient psychiatric admissions.  He reported he had no family around to contact for collateral.  During assessment patient was noted to nod off and fall back asleep many times.  Patient was unable to participate in full assessment at this time so the information obtained was limited.  Psychiatry will continue to round on patient and attempt a full assessment when patient more alert.  Psych ROS:  Depression: UTA Anxiety:  UTA Mania (lifetime and current): Documented bipolar history Psychosis: (lifetime and current): UTA  Collateral information:  Unable to obtain any collateral information    Psychiatric and Social History  Psychiatric History:  Information collected from Patient/chart review  Prev Dx/Sx: Bipolar disorder Current Psych Provider: UTA Home Meds (current): Lithium  and Latuda  Previous Med Trials: UTA Therapy: UTA  Prior Psych Hospitalization: Denied  Prior Self Harm: UTA Prior Violence: UTA  Family Psych History: UTA Family Hx suicide: UTA  Social History:   Educational Hx: UTA Occupational Hx: UTA Legal Hx: Documented notes that patient was brought in initially from jail Living Situation: UTA Spiritual Hx: UTA Access to weapons/lethal means: UTA   Substance History Alcohol: UTA  Illicit drugs: UTA Prescription drug abuse: UTA Rehab hx: UTA  Exam Findings  Physical Exam: MD note reviewed Vital Signs:  Temp:  [97.5 F (36.4 C)-98.1 F (36.7 C)] 97.7 F (36.5 C) (10/03 0919) Pulse Rate:  [63-93] 84 (10/03 1019) Resp:  [10-25] 15 (10/03 1019) BP: (136-186)/(86-142) 160/117 (10/03 1019) SpO2:  [72 %-100 %] 100 % (10/03 1019) Blood pressure (!) 160/117, pulse 84,  temperature 97.7 F (36.5 C), temperature source Axillary, resp. rate 15, height 5' 10 (1.778 m), weight 72.8 kg, SpO2 100%. Body mass index is 23.03 kg/m.    Mental Status Exam: General Appearance: Fairly Groomed- patient still requiring assistance with ADLs from recent procedure  Orientation:  Other:  UTA  Memory:  Immediate;   UTA Recent;   UTA Remote;   UTA  Concentration:  Concentration: Poor and Attention Span: Poor  Recall:  Poor  Attention  Poor  Eye Contact:  UTA  Speech:  Slow  Language:  Fair  Volume:  Normal  Mood: I've been better  Affect:  UTA  Thought Process:  NA  Thought Content:  UTA  Suicidal Thoughts:  No  Homicidal Thoughts:  No  Judgement:  Other:  UTA  Insight:  UTA  Psychomotor Activity:  UTA  Akathisia:  UTA  Fund of Knowledge:  UTA      Assets:  Others:  UTA  Cognition:  Impaired,  Moderate  ADL's:  Impaired  AIMS (if indicated):        Other History   These have been pulled in through the EMR, reviewed, and updated if appropriate.  Family History:  The patient's family history is not on file.  Medical History: Past Medical History:  Diagnosis Date   Anxiety    Bipolar 1 disorder (HCC)    Depression    Hepatitis    Hypertension     Surgical History: Past Surgical History:  Procedure Laterality Date   CRANIOTOMY Left 06/30/2024   Procedure: CRANIOTOMY HEMATOMA EVACUATION SUBDURAL;  Surgeon: Melonie Grass, MD;  Location: [facility] ORS;  Service: Neurosurgery;  Laterality: Left;     Medications:   Current Facility-Administered Medications:    acetaminophen  (TYLENOL ) suppository 650 mg, 650 mg, Rectal, Q6H PRN, Nelson, Dana G, NP, 650 mg at 06/30/24 1952   Chlorhexidine Gluconate Cloth 2 % PADS 6 each, 6 each, Topical, Daily, Bousman, Karlie, PA-C, 6 each at 07/05/24 1000   docusate (COLACE) 50 MG/5ML liquid 100 mg, 100 mg, Oral, BID PRN, Rust-Chester, Jenita CROME, NP   enoxaparin  (LOVENOX ) injection 40 mg, 40 mg,  Subcutaneous, Q24H, Wieting, Richard, MD, 40 mg at 07/04/24 1923   feeding supplement (ENSURE PLUS HIGH PROTEIN) liquid 237 mL, 237 mL, Oral, TID BM, Wieting, Richard, MD, 237 mL at 07/05/24 9074   folic acid  (FOLVITE ) tablet 1 mg, 1 mg, Oral, Daily, Rust-Chester, Britton L, NP, 1 mg at 07/05/24 9076   lithium  carbonate (ESKALITH ) ER tablet 450 mg, 450 mg, Oral, QHS, Rust-Chester, Jenita L, NP, 450 mg at 07/04/24 2151   LORazepam  (ATIVAN ) injection 0.5 mg, 0.5 mg, Intravenous, Q4H PRN, Ponnala, Shruthi, MD, 0.5 mg at 07/05/24 0539   lurasidone  (LATUDA ) tablet 20 mg, 20 mg, Oral, Q breakfast, Rust-Chester, Jenita L, NP, 20 mg at 07/05/24 9075   multivitamin with minerals tablet 1 tablet, 1 tablet, Oral, Daily, Rust-Chester, Jenita CROME, NP, 1 tablet at 07/05/24 9076   Oral care mouth rinse, 15 mL, Mouth Rinse, PRN, Ponnala, Shruthi, MD   [COMPLETED] PHENobarbital (LUMINAL) tablet 97.2 mg, 97.2 mg, Oral, Q8H, 97.2 mg at 07/04/24 0454 **FOLLOWED BY** PHENobarbital (LUMINAL) tablet 64.8 mg, 64.8 mg, Oral, Q8H, 64.8 mg at 07/05/24 1227 **FOLLOWED BY** [START ON 07/06/2024] PHENobarbital (LUMINAL) tablet 32.4 mg, 32.4 mg, Oral, Q8H, Wieting, Richard, MD   polyethylene glycol (MIRALAX / GLYCOLAX) packet 17 g, 17 g, Oral, Daily PRN, Chappell, Alex B, RPH   thiamine  (VITAMIN B1) tablet 100 mg, 100 mg, Oral, Daily, Chappell, Alex B, RPH, 100 mg at 07/05/24 9076  Allergies: No Known Allergies  Zelda Sharps, NP

## 2024-07-05 NOTE — Progress Notes (Signed)
 Physical Therapy Treatment Patient Details Name: Ernest Romero MRN: [id number] DOB: 06-17-1966 Today's Date: 07/05/2024   History of Present Illness Pt is a 58 year old male presenting from jail after found down,  found to have a large left sided SDH with shift s/p left craniectomy 06/30/24    PMH significant for PMH alcohol use disorder, psychiatric disorder    PT Comments  Pt seen for PT tx with co-tx with OT for pt & therapists' safety. Pt received resting/asleep in bed, easily awakened & opening eyes more this date (R eye opens more than L). Pt immediately perseverative on locating phone, attempted to redirect pt throughout session but unable. Pt is able to ambulate with BUE HHA min assist +2, does endorse fatigue during gait, once back in bed, immediately falling asleep again. Pt is making good progress with mobility. Will continue to follow pt acutely to progress safety with mobility as able.    If plan is discharge home, recommend the following: A lot of help with walking and/or transfers;A lot of help with bathing/dressing/bathroom;Assistance with cooking/housework;Direct supervision/assist for financial management;Supervision due to cognitive status;Help with stairs or ramp for entrance;Direct supervision/assist for medications management;Assist for transportation   Can travel by private vehicle        Equipment Recommendations  Other (comment) (TBD)    Recommendations for Other Services       Precautions / Restrictions Precautions Precautions: Fall Recall of Precautions/Restrictions: Impaired Precaution/Restrictions Comments: helmet, ok to mobilize with therapy prior to delivery per neurosurgical team Restrictions Weight Bearing Restrictions Per Provider Order: No     Mobility  Bed Mobility Overal bed mobility: Needs Assistance Bed Mobility: Supine to Sit     Supine to sit: Min assist, +2 for physical assistance, HOB elevated, Used rails Sit to supine: Min assist         Transfers Overall transfer level: Needs assistance Equipment used: 1 person hand held assist Transfers: Sit to/from Stand Sit to Stand: Min assist           General transfer comment: able to transfer sit>Stand from EOB with min assist HHA +1    Ambulation/Gait Ambulation/Gait assistance: Min assist, +2 physical assistance, +2 safety/equipment Gait Distance (Feet): 60 Feet Assistive device: 2 person hand held assist Gait Pattern/deviations: Decreased step length - left, Decreased step length - right, Decreased stride length, Decreased dorsiflexion - right, Decreased dorsiflexion - left Gait velocity: decreased     General Gait Details: decreased LLE step length, decreased LLE heel strike & dorsiflexion, dragging LLE intermittently   Stairs             Wheelchair Mobility     Tilt Bed    Modified Rankin (Stroke Patients Only)       Balance Overall balance assessment: Needs assistance Sitting-balance support: Feet supported Sitting balance-Leahy Scale: Fair     Standing balance support: Bilateral upper extremity supported, During functional activity Standing balance-Leahy Scale: Poor                              Communication Communication Communication: Impaired Factors Affecting Communication: Reduced clarity of speech  Cognition Arousal:  (wax & wane, alert to sleeping very quickly) Behavior During Therapy:  (irritated, perseverative on finding his phone, unable to redirect)   PT - Cognitive impairments: No family/caregiver present to determine baseline                   Rancho  Levels of Cognitive Functioning [place] Mirant Scales of Cognitive Functioning: Confused/Agitated: Maximal Assistance [facility] Scales of Cognitive Functioning: Confused/Agitated: Maximal Assistance [IV]   Following commands: Impaired Following commands impaired: Follows one step commands inconsistently, Follows one step commands with  increased time    Cueing Cueing Techniques: Verbal cues, Gestural cues, Tactile cues, Visual cues  Exercises      General Comments General comments (skin integrity, edema, etc.): VSS throughout session      Pertinent Vitals/Pain Pain Assessment Pain Assessment: CPOT Facial Expression: Relaxed, neutral Body Movements: Absence of movements Muscle Tension: Relaxed Compliance with ventilator (intubated pts.): N/A Vocalization (extubated pts.): Talking in normal tone or no sound CPOT Total: 0    Home Living                          Prior Function            PT Goals (current goals can now be found in the care plan section) Acute Rehab PT Goals Patient Stated Goal: to walk, to go home PT Goal Formulation: With patient Time For Goal Achievement: 07/16/24 Potential to Achieve Goals: Fair Progress towards PT goals: Progressing toward goals    Frequency    Min 3X/week      PT Plan      Co-evaluation PT/OT/SLP Co-Evaluation/Treatment: Yes Reason for Co-Treatment: Complexity of the patient's impairments (multi-system involvement);Necessary to address cognition/behavior during functional activity;To address functional/ADL transfers PT goals addressed during session: Mobility/safety with mobility;Balance;Strengthening/ROM        AM-PAC PT 6 Clicks Mobility   Outcome Measure  Help needed turning from your back to your side while in a flat bed without using bedrails?: A Little Help needed moving from lying on your back to sitting on the side of a flat bed without using bedrails?: A Lot Help needed moving to and from a bed to a chair (including a wheelchair)?: A Lot Help needed standing up from a chair using your arms (e.g., wheelchair or bedside chair)?: A Lot Help needed to walk in hospital room?: Total Help needed climbing 3-5 steps with a railing? : Total 6 Click Score: 11    End of Session   Activity Tolerance: Patient tolerated treatment well;Patient  limited by fatigue (limited 2/2 perseverating on locating phone) Patient left: in bed;with call bell/phone within reach;with bed alarm set;with nursing/sitter in room Nurse Communication: Mobility status PT Visit Diagnosis: Other symptoms and signs involving the nervous system (R29.898);Difficulty in walking, not elsewhere classified (R26.2);Muscle weakness (generalized) (M62.81);Other abnormalities of gait and mobility (R26.89);Unsteadiness on feet (R26.81)     Time: 8996-8980 PT Time Calculation (min) (ACUTE ONLY): 16 min  Charges:    $Therapeutic Activity: 8-22 mins PT General Charges $$ ACUTE PT VISIT: 1 Visit                     Richerd Pinal, PT, DPT 07/05/24, 11:02 AM   Richerd CHRISTELLA Pinal 07/05/2024, 11:01 AM

## 2024-07-05 NOTE — Progress Notes (Signed)
 Occupational Therapy Treatment Patient Details Name: Ernest Romero MRN: [id number] DOB: 1965/11/18 Today's Date: 07/05/2024   History of present illness Pt is a 58 year old male presenting from jail after found down,  found to have a large left sided SDH with shift s/p left craniectomy 06/30/24    PMH significant for PMH alcohol use disorder, psychiatric disorder   OT comments  Pt seen for skilled co-treatment with PT and OT. Pt continues to wax and wain in regards to alertness during session. Initially difficulty to wake up at beginning of session. Pt performs bed mobility with min A and ambulates with min A of 2 for 60'. Pt difficult to redirect this session and is very focused on locating his phone. He is convinced that therapist knows where it is during session and perseverated on it throughout requiring max multimodal cues. Once pt returns to bed, he is very fatigued and asleep before therapist exits the room. Pt making progress this session.       If plan is discharge home, recommend the following:  A lot of help with bathing/dressing/bathroom;A lot of help with walking and/or transfers;Supervision due to cognitive status   Equipment Recommendations  Other (comment) (defer)    Recommendations for Other Services Rehab consult    Precautions / Restrictions Precautions Precautions: Fall Recall of Precautions/Restrictions: Impaired Precaution/Restrictions Comments: helmet, ok to mobilize with therapy prior to delivery per neurosurgical team       Mobility Bed Mobility Overal bed mobility: Needs Assistance Bed Mobility: Supine to Sit     Supine to sit: Min assist, +2 for physical assistance, HOB elevated, Used rails Sit to supine: Min assist        Transfers Overall transfer level: Needs assistance Equipment used: 1 person hand held assist Transfers: Sit to/from Stand Sit to Stand: Min assist           General transfer comment: able to transfer sit>Stand from EOB with  min assist HHA +1     Balance Overall balance assessment: Needs assistance Sitting-balance support: Feet supported Sitting balance-Leahy Scale: Fair Sitting balance - Comments: CGA static sitting EOB   Standing balance support: Bilateral upper extremity supported, During functional activity Standing balance-Leahy Scale: Poor                             ADL either performed or assessed with clinical judgement              Communication Communication Communication: Impaired Factors Affecting Communication: Reduced clarity of speech   Cognition Arousal: Alert, Lethargic Behavior During Therapy: Restless Cognition: No family/caregiver present to determine baseline, Cognition impaired, Rancho level   Orientation impairments: Place, Time, Situation Awareness: Intellectual awareness impaired, Online awareness impaired Memory impairment (select all impairments): Short-term memory, Declarative long-term memory, Non-declarative long-term memory Attention impairment (select first level of impairment): Focused attention Executive functioning impairment (select all impairments): Initiation, Organization, Sequencing, Reasoning, Problem solving                 Rancho Mirant Scales of Cognitive Functioning: Confused/Agitated: Maximal Assistance [IV] Following commands: Impaired Following commands impaired: Follows one step commands inconsistently, Follows one step commands with increased time      Cueing   Cueing Techniques: Verbal cues, Gestural cues, Tactile cues, Visual cues             Pertinent Vitals/ Pain       Pain Assessment Pain Assessment: Faces Faces  Pain Scale: No hurt  Home Living       Type of Home:  (Jail)                                      Frequency  Min 3X/week        Progress Toward Goals  OT Goals(current goals can now be found in the care plan section)  Progress towards OT goals: Progressing toward  goals     Plan      Co-evaluation    PT/OT/SLP Co-Evaluation/Treatment: Yes Reason for Co-Treatment: Complexity of the patient's impairments (multi-system involvement);Necessary to address cognition/behavior during functional activity;To address functional/ADL transfers PT goals addressed during session: Mobility/safety with mobility;Balance;Strengthening/ROM OT goals addressed during session: ADL's and self-care;Proper use of Adaptive equipment and DME      AM-PAC OT 6 Clicks Daily Activity     Outcome Measure   Help from another person eating meals?: A Little Help from another person taking care of personal grooming?: A Lot Help from another person toileting, which includes using toliet, bedpan, or urinal?: A Lot Help from another person bathing (including washing, rinsing, drying)?: A Lot Help from another person to put on and taking off regular upper body clothing?: A Lot Help from another person to put on and taking off regular lower body clothing?: A Lot 6 Click Score: 13    End of Session    OT Visit Diagnosis: Other abnormalities of gait and mobility (R26.89);Muscle weakness (generalized) (M62.81);Other symptoms and signs involving the nervous system (R29.898);Cognitive communication deficit (R41.841);Other symptoms and signs involving cognitive function   Activity Tolerance Patient tolerated treatment well   Patient Left in bed;with call bell/phone within reach;with bed alarm set;with nursing/sitter in room   Nurse Communication Mobility status        Time: 8996-8980 OT Time Calculation (min): 16 min  Charges: OT General Charges $OT Visit: 1 Visit  Izetta Claude, MS, OTR/L , CBIS ascom [phone]  07/05/24, 4:05 PM

## 2024-07-05 NOTE — Progress Notes (Signed)
 [facility] security contacted about pt's missing belongings. RN informed they would look for them and report back.

## 2024-07-05 NOTE — Plan of Care (Signed)
  Problem: Clinical Measurements: Goal: Ability to maintain clinical measurements within normal limits will improve Outcome: Progressing Goal: Will remain free from infection Outcome: Progressing Goal: Respiratory complications will improve Outcome: Progressing   Problem: Activity: Goal: Risk for activity intolerance will decrease Outcome: Progressing   Problem: Nutrition: Goal: Adequate nutrition will be maintained Outcome: Progressing   Problem: Pain Managment: Goal: General experience of comfort will improve and/or be controlled Outcome: Progressing   Problem: Skin Integrity: Goal: Risk for impaired skin integrity will decrease Outcome: Progressing   Problem: Education: Goal: Knowledge of General Education information will improve Description: Including pain rating scale, medication(s)/side effects and non-pharmacologic comfort measures Outcome: Not Progressing   Problem: Health Behavior/Discharge Planning: Goal: Ability to manage health-related needs will improve Outcome: Not Progressing   Problem: Coping: Goal: Level of anxiety will decrease Outcome: Not Progressing   Problem: Safety: Goal: Ability to remain free from injury will improve Outcome: Not Progressing

## 2024-07-06 LAB — MAGNESIUM: Magnesium: 2.4 mg/dL (ref 1.7–2.4)

## 2024-07-06 LAB — BASIC METABOLIC PANEL WITH GFR
Anion gap: 8 (ref 5–15)
BUN: 15 mg/dL (ref 6–20)
CO2: 26 mmol/L (ref 22–32)
Calcium: 9.1 mg/dL (ref 8.9–10.3)
Chloride: 103 mmol/L (ref 98–111)
Creatinine, Ser: 0.73 mg/dL (ref 0.61–1.24)
GFR, Estimated: >60 mL/min (ref >60)
Glucose, Bld: 103 mg/dL — ABNORMAL HIGH (ref 70–99)
Potassium: 4.1 mmol/L (ref 3.5–5.1)
Sodium: 137 mmol/L (ref 135–145)

## 2024-07-06 LAB — GLUCOSE, CAPILLARY
Glucose-Capillary: 102 mg/dL — ABNORMAL HIGH (ref 70–99)
Glucose-Capillary: 110 mg/dL — ABNORMAL HIGH (ref 70–99)
Glucose-Capillary: 113 mg/dL — ABNORMAL HIGH (ref 70–99)
Glucose-Capillary: 116 mg/dL — ABNORMAL HIGH (ref 70–99)
Glucose-Capillary: 124 mg/dL — ABNORMAL HIGH (ref 70–99)
Glucose-Capillary: 130 mg/dL — ABNORMAL HIGH (ref 70–99)

## 2024-07-06 LAB — LITHIUM LEVEL: Lithium Lvl: 0.39 mmol/L — ABNORMAL LOW (ref 0.60–1.20)

## 2024-07-06 LAB — PHOSPHORUS: Phosphorus: 2.9 mg/dL (ref 2.5–4.6)

## 2024-07-06 MED ORDER — HALOPERIDOL LACTATE 5 MG/ML IJ SOLN: 2.0000 mg | Freq: Once | INTRAMUSCULAR | Status: DC

## 2024-07-06 MED ORDER — HALOPERIDOL LACTATE 5 MG/ML IJ SOLN
2.0000 mg | Freq: Once | INTRAMUSCULAR | Status: AC | PRN
  Administered 2024-07-06: 2 mg via INTRAVENOUS
  Filled 2024-07-06: qty 1

## 2024-07-06 NOTE — Plan of Care (Signed)
  Problem: Health Behavior/Discharge Planning: Goal: Ability to manage health-related needs will improve Outcome: Progressing   Problem: Clinical Measurements: Goal: Will remain free from infection Outcome: Progressing   Problem: Activity: Goal: Risk for activity intolerance will decrease Outcome: Progressing   Problem: Coping: Goal: Level of anxiety will decrease Outcome: Progressing   

## 2024-07-06 NOTE — Consult Note (Signed)
 [facility] Psychiatric Consult Follow-up  Patient Name: .Ernest Romero  MRN: [id number]  DOB: 03/14/1966  Consult Order details:  Orders (From admission, onward)     Start     Ordered   07/01/24 2119  IP CONSULT TO PSYCHIATRY       Ordering Provider: Rust-Chester, Jenita CROME, NP  Provider:  (Not yet assigned)  Question Answer Comment  Location [facility]   Reason for Consult? bipolar disorder, need medication guidance      07/01/24 2118             Mode of Visit: In person    Psychiatry Consult Evaluation  Service Date: July 06, 2024 LOS:  LOS: 6 days  Chief Complaint Low lithium  levels  Primary Psychiatric Diagnoses  Bipolar disorder   Assessment  Ernest Romero is a 58 y.o. male admitted: Medically. Patient was assessed on 07/05/24 by psychiatry and was unable to engage in a meaningful interview due to status post left craniectomy on 06/30/24. Consult was requested by attending due to low lithium  levels. Patient is currently sleeping with sitter at bedside. Recently was given ativan  0.5 mg due to 'he got spicy and was fixated on the need to be discharged. Review of lithium  levels, he is historically at his current level of 0.39 Will continue to monitor behavior and mood and adjust according if needed. Also receiving phenobarbital as precaution following surgery.      Diagnoses:  Active Hospital problems: Principal Problem:   Subdural hematoma (HCC) Active Problems:   Bipolar disorder (HCC)   Essential hypertension   Malnutrition of moderate degree   Alcohol withdrawal (HCC)   Hypophosphatemia    Plan   ## Psychiatric Medication Recommendations:  Lithium  450 mg at bedtime Lurasidone    ## Medical Decision Making Capacity: Not specifically addressed in this encounter  ## Further Work-up:   Lithium  level on 07/06/24 0.39, 6 days ago <0.10, 4 months ago <0.06   ## Disposition:-- There are no psychiatric contraindications to discharge  at this time  ## Behavioral / Environmental: -Delirium Precautions: Delirium Interventions for Nursing and Staff: - RN to open blinds every AM. - To Bedside: Glasses, hearing aide, and pt's own shoes. Make available to patients. when possible and encourage use. - Encourage po fluids when appropriate, keep fluids within reach. - OOB to chair with meals. - Passive ROM exercises to all extremities with AM & PM care. - RN to assess orientation to person, time and place QAM and PRN. - Recommend extended visitation hours with familiar family/friends as feasible. - Staff to minimize disturbances at night. Turn off television when pt asleep or when not in use.    ## Safety and Observation Level:  - Based on my clinical evaluation, I estimate the patient to be at LOW risk of self harm in the current setting. - At this time, we recommend  routine. This decision is based on my review of the chart including patient's history and current presentation, interview of the patient, mental status examination, and consideration of suicide risk including evaluating suicidal ideation, plan, intent, suicidal or self-harm behaviors, risk factors, and protective factors. This judgment is based on our ability to directly address suicide risk, implement suicide prevention strategies, and develop a safety plan while the patient is in the clinical setting. Please contact our team if there is a concern that risk level has changed.  CSSR Risk Category:   Suicide Risk Assessment: Patient has following modifiable risk factors for  suicide: active mental illness (to encompass adhd, tbi, mania, psychosis, trauma reaction), which we are addressing by encouraging compliance with medications, monitoring lithium  level. Patient has following non-modifiable or demographic risk factors for suicide: male gender Patient has the following protective factors against suicide: Access to outpatient mental health care  Thank you for this consult  request. Recommendations have been communicated to the primary team.  We will continue to follow, monitor response to lithium  and cognitive status at this time.   Raysha Tilmon B Tangi Shroff, NP       History of Present Illness  Relevant Aspects of [facility]   Patient Report:  Ernest Romero is a 58 y.o. male admitted: Medically. Patient was assessed on 07/05/24 by psychiatry and was unable to engage in a meaningful interview due to status post left craniectomy on 06/30/24. Consult was requested by attending due to low lithium  levels. Patient is currently sleeping with sitter at bedside. Recently was given ativan  0.5 mg due to 'he got spicy and was fixated on the need to be discharged. Review of lithium  levels, he is historically at his current level of 0.39 Will continue to monitor behavior and mood and adjust according if needed. Also receiving phenobarbital as precaution following surgery.   Psych ROS:  Depression: documented hx Anxiety:  documented hx Mania (lifetime and current): documented hx Psychosis: (lifetime and current): unknown   Psychiatric and Social History  Psychiatric History:  Information collected from patient chart, nursing staff  Prev Dx/Sx: bipolar disorder Current Psych Provider: unknown Home Meds (current): lithium , lurasidone  Previous Med Trials: unknown Therapy: unknown  Prior Psych Hospitalization: unknown  Prior Self Harm: unknown Prior Violence: unknown  Family Psych History: unknown Family Hx suicide: unknown  Social History:   Educational Hx: unknown Occupational Hx: unknown Legal Hx: was incarcerated Living Situation: jail Spiritual Hx: unknown Access to weapons/lethal means: unknown   Substance History Alcohol: unknown  Type of alcohol unknown Last Drink unknown Number of drinks per day unknown History of alcohol withdrawal seizures unknown History of DT's unknown Tobacco: unknown Illicit drugs: unknown Prescription drug abuse:  unknown Rehab hx: unknown  Exam Findings  Physical Exam: I have reviewed and agree with previous exam Vital Signs:  Temp:  [98 F (36.7 C)-98.7 F (37.1 C)] 98.4 F (36.9 C) (10/04 0416) Pulse Rate:  [57-79] 57 (10/04 0416) Resp:  [16-21] 16 (10/04 0416) BP: (132-162)/(84-118) 148/112 (10/04 0416) SpO2:  [98 %-100 %] 98 % (10/04 0416) Weight:  [71.2 kg] 71.2 kg (10/04 0453) Blood pressure (!) 148/112, pulse (!) 57, temperature 98.4 F (36.9 C), temperature source Oral, resp. rate 16, height 5' 10 (1.778 m), weight 71.2 kg, SpO2 98%. Body mass index is 22.52 kg/m.    Mental Status Exam: General Appearance: UTA  Orientation:  UTA  Memory:  UTA  Concentration:  UTA  Recall:  UTA  Attention  UTA  Eye Contact:  UTA  Speech:  UTAUTA  Language:  UTA  Volume:  UTA  Mood: UTA  Affect:  UTA  Thought Process:  UTA  Thought Content:  UTA  Suicidal Thoughts:  UTA  Homicidal Thoughts:  UTA  Judgement:  UTAUTA  Insight:  UTA  Psychomotor Activity:  UTA  Akathisia:  UTA  Fund of Knowledge:  UTA      Assets:  UTA  Cognition:  UTA  ADL's:  UTA  AIMS (if indicated):        Other History   These have been pulled in through the EMR,  reviewed, and updated if appropriate.  Family History:  The patient's family history is not on file.  Medical History: Past Medical History:  Diagnosis Date   Anxiety    Bipolar 1 disorder (HCC)    Depression    Hepatitis    Hypertension     Surgical History: Past Surgical History:  Procedure Laterality Date   CRANIOTOMY Left 06/30/2024   Procedure: CRANIOTOMY HEMATOMA EVACUATION SUBDURAL;  Surgeon: Melonie Grass, MD;  Location: [facility] ORS;  Service: Neurosurgery;  Laterality: Left;     Medications:   Current Facility-Administered Medications:    acetaminophen  (TYLENOL ) suppository 650 mg, 650 mg, Rectal, Q6H PRN, Nelson, Dana G, NP, 650 mg at 06/30/24 1952   amLODipine  (NORVASC ) tablet 5 mg, 5 mg, Oral, Daily, Ponnala,  Shruthi, MD, 5 mg at 07/06/24 1005   Chlorhexidine Gluconate Cloth 2 % PADS 6 each, 6 each, Topical, Daily, Bousman, Karlie, PA-C, 6 each at 07/06/24 1006   docusate (COLACE) 50 MG/5ML liquid 100 mg, 100 mg, Oral, BID PRN, Rust-Chester, Britton L, NP   enoxaparin  (LOVENOX ) injection 40 mg, 40 mg, Subcutaneous, Q24H, Wieting, Richard, MD, 40 mg at 07/05/24 2053   feeding supplement (ENSURE PLUS HIGH PROTEIN) liquid 237 mL, 237 mL, Oral, TID BM, Wieting, Richard, MD, 237 mL at 07/06/24 1006   folic acid  (FOLVITE ) tablet 1 mg, 1 mg, Oral, Daily, Rust-Chester, Jenita L, NP, 1 mg at 07/06/24 1006   haloperidol  lactate (HALDOL ) injection 2 mg, 2 mg, Intravenous, Once PRN, Ponnala, Shruthi, MD   lithium  carbonate (ESKALITH ) ER tablet 450 mg, 450 mg, Oral, QHS, Rust-Chester, Jenita L, NP, 450 mg at 07/05/24 2053   LORazepam  (ATIVAN ) injection 0.5 mg, 0.5 mg, Intravenous, Q4H PRN, Ponnala, Shruthi, MD, 0.5 mg at 07/06/24 1006   lurasidone  (LATUDA ) tablet 20 mg, 20 mg, Oral, Q breakfast, Rust-Chester, Jenita L, NP, 20 mg at 07/06/24 0749   multivitamin with minerals tablet 1 tablet, 1 tablet, Oral, Daily, Rust-Chester, Jenita L, NP, 1 tablet at 07/06/24 1006   Oral care mouth rinse, 15 mL, Mouth Rinse, PRN, Ponnala, Shruthi, MD   [COMPLETED] PHENobarbital (LUMINAL) tablet 97.2 mg, 97.2 mg, Oral, Q8H, 97.2 mg at 07/04/24 0454 **FOLLOWED BY** [COMPLETED] PHENobarbital (LUMINAL) tablet 64.8 mg, 64.8 mg, Oral, Q8H, 64.8 mg at 07/06/24 0417 **FOLLOWED BY** PHENobarbital (LUMINAL) tablet 32.4 mg, 32.4 mg, Oral, Q8H, Wieting, Richard, MD   polyethylene glycol (MIRALAX / GLYCOLAX) packet 17 g, 17 g, Oral, Daily PRN, Clair Marolyn NOVAK, RPH   thiamine  (VITAMIN B1) tablet 100 mg, 100 mg, Oral, Daily, Chappell, Alex B, RPH, 100 mg at 07/06/24 1005  Allergies: No Known Allergies  Cheridan Kibler B Mariona Scholes, NP

## 2024-07-06 NOTE — Progress Notes (Signed)
 Pt safety sitting at bedside. Frequent rounding & checks. Medications given as needed.

## 2024-07-06 NOTE — Plan of Care (Signed)
  Problem: Pain Managment: Goal: General experience of comfort will improve and/or be controlled Outcome: Progressing   Problem: Safety: Goal: Ability to remain free from injury will improve Outcome: Progressing

## 2024-07-06 NOTE — Progress Notes (Signed)
 Neurosurgery visit note Patient seen and examined.  He was sleeping at the time I examined him was able to easily awaken though.   His incision is clean dry and intact with staples in place the flap is soft nondistended He is awake and oriented times name he was oriented to [place]  and [place] but not specifically at hospital he thought he was at a physical therapy location, he got the year corrected 2025.  Otherwise his cranial nerves were grossly intact it was a 4 extremities grossly full strength and sensation.  AP: Overall the patient is doing well continue to mobilize with helmet as you are doing awaiting Dispo planning  Ernest Romero. Deatrice, MD Neurosurgery

## 2024-07-06 NOTE — Progress Notes (Signed)
 PT Cancellation Note  Patient Details Name: Ernest Romero MRN: [id number] DOB: [birth year]   Cancelled Treatment:     Pt very restless earlier today, attempting OOB to leave, Nursing administered Ativan  and requested PT to hold off, pt currently resting soundly. Will re-attempt next available date/time per POC.    [name] 07/06/2024, 1:05 PM

## 2024-07-06 NOTE — Progress Notes (Signed)
 Progress Note   Patient: Ernest Romero FMW:[id number] DOB: [birth year] DOA: 06/30/2024     6 DOS: the patient was seen and examined on 07/06/2024   Brief hospital course: 58 y.o. male who [facility] ED for evaluation of a subdural hematoma. Patient presented from local jail for the evaluation of unresponsiveness. Previous Head CT has shown small subdural hematomas over the past few weeks  According to the jail staff, the patient had been incarcerated for approximately 12 hours prior to this acute change in mental status. There were no signs to suggest trauma, injuries or falls.  Due to AMS, concern for airway protection and respiratory failure, pt was intubated in ER. CT with left SDH with significant midline shift, s/p left fronto parietal craniotomy by Neurosurgery. Hospital course as below  Assessment and Plan: Subdural hematoma (HCC) - Status post left frontal parietal craniotomy with evacuation of subdural hematoma and placement of a drain.  Drain removed on 9/29.   - Neurosurgical team placed an order for helmet.   - Continue working with PT and OT - Keppra discontinued as patient is on phenobarbital - Repeat CT in 6 weeks as outpatient, Neurosurgery follow up   Alcohol withdrawal (HCC) - Continue thiamine , folic acid  and phenobarbital taper - Ativan  prn, dose lowered as patient was very drowsy  Acute Encephalopathy - Improving - Suspect behavioral/agitation likely due to acute alcohol withdrawal - May also have some changes due to recent craniotomy, underlying bipolar disorder - SLP following for cognitive/lang eval - Has 1:1 sitter - On phenobarbital taper and Ativan  as needed as needed as above - Haldol  prn for agitation   Malnutrition of moderate degree - Continue supplements   Bipolar disorder (HCC) - Seen by Psychiatry, appreciate recs, on lithium  and Latuda    Essential hypertension - Amlodipine    Hypophosphatemia - Repleted  Social determinants - Due to  acute changes patient does not have decision-making capacity at this time, is AO 1-2, states we can reach out to peer support counsellor [place] (?[phone]) - tried calling the number, inaccurate - 10/04 asked me call his mother, unable to provide any details - Unclear how much patient will improve and what his new baseline will be - TOC aware  PT/OT recommend Inpatient rehab    Subjective: Patient was examined at the bedside, mental status improving Able to tell me his name, states it is April 2025, and he is in [place] in [place] at physical therapy office P.o. intake improving, has 1:1 sitter  Physical Exam: Vitals:   07/05/24 2008 07/05/24 2348 07/06/24 0416 07/06/24 0453  BP: (!) 161/100 (!) 162/118 (!) 148/112   Pulse: 63 66 (!) 57   Resp: 16 16 16    Temp: 98.7 F (37.1 C) 98.2 F (36.8 C) 98.4 F (36.9 C)   TempSrc: Oral Oral Oral   SpO2: 100% 99% 98%   Weight:    71.2 kg  Height:       HENT:     Mouth/Throat:     Pharynx: No oropharyngeal exudate.  Eyes:     General: Lids are normal.  Cardiovascular:     Rate and Rhythm: Normal rate and regular rhythm.     Heart sounds: Normal heart sounds, S1 normal and S2 normal.  Pulmonary:     Breath sounds: No decreased breath sounds, wheezing, rhonchi or rales.  Abdominal:     Palpations: Abdomen is soft.     Tenderness: There is no abdominal tenderness.  Musculoskeletal:  Right lower leg: No swelling.     Left lower leg: No swelling.  Skin:    General: Skin is warm.     Findings: No rash.  Neurological:     Mental Status: AO 1-2, but improving    Comments: Able to move all extremities   Data Reviewed: Labs and imaging reviewed  Family Communication: No contacts in computer  Disposition: Status is: Inpatient Remains inpatient appropriate because: Continue working with PT and OT.  Continue phenobarbital taper  Planned Discharge Destination: Rehab  Time spent: 35 minutes  Author: Laree Lock,  MD 07/06/2024 1:50 PM  For on call review [email or web address].

## 2024-07-06 NOTE — Progress Notes (Signed)
 Speech Language Pathology Treatment: Cognitive-Linguistic  Patient Details Name: Ernest Romero MRN: [id number] DOB: 04/02/1966 Today's Date: 07/06/2024 Time: 8899-8871 SLP Time Calculation (min) (ACUTE ONLY): 28 min  Assessment / Plan / Recommendation Clinical Impression  Pt seen for cognitive communication intervention following initial assessment on 10/3. Pt initially resting, though easily roused with min verbal cues. Orientation aid training completed for use of clock and white board for time, date, and location. Pt continues to accurately identify city, with more impairment noted for details of orientation. Visual aids (helmet, safety devices) utilized for increased awareness/attention to prompts regarding current situation. With stated cues, pt demonstrating emerging awareness- I had a brain injury. Clarification prompts with repetition provided to bolster awareness. Simple language in conversation remains relative strength (lasting 2-5 min)- with min verbal cues to aid redirection topic. Internal distractions (fatigue, need to urinate) hinder sustained attention. Pt eyes drifting closed at end of session, no overt verbal agitation noted.  Cognitive communication impairment is complex based on pt's medical history and acute neuro injury. RLA level IV remains most appropriate classification. Recommend use of orientation aids in room, limiting all external distractions, provision of visuals to aid attention, repetition, and clear instructions. SLP will continue to follow.    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

## 2024-07-07 LAB — GLUCOSE, CAPILLARY
Glucose-Capillary: 100 mg/dL — ABNORMAL HIGH (ref 70–99)
Glucose-Capillary: 100 mg/dL — ABNORMAL HIGH (ref 70–99)
Glucose-Capillary: 136 mg/dL — ABNORMAL HIGH (ref 70–99)
Glucose-Capillary: 86 mg/dL (ref 70–99)
Glucose-Capillary: 106 mg/dL — ABNORMAL HIGH (ref 70–99)

## 2024-07-07 MED ORDER — HALOPERIDOL LACTATE 5 MG/ML IJ SOLN: 2.0000 mg | Freq: Every day | INTRAMUSCULAR | Status: DC | PRN

## 2024-07-07 MED ORDER — HALOPERIDOL LACTATE 5 MG/ML IJ SOLN
2.0000 mg | Freq: Every day | INTRAMUSCULAR | Status: DC | PRN
  Administered 2024-07-07 – 2024-07-10 (×5): 2 mg via INTRAVENOUS
  Filled 2024-07-07 (×6): qty 1

## 2024-07-07 NOTE — Progress Notes (Signed)
 PT Cancellation Note  Patient Details Name: Ernest Romero MRN: [id number] DOB: 02-22-1966   Cancelled Treatment:    Reason Eval/Treat Not Completed: Fatigue/lethargy limiting ability to participate PT hold due to pt limited ability of pt to participate. PT will check back at later time.   Moataz Tavis A Calvin Jablonowski 07/07/2024, 11:16 AM

## 2024-07-07 NOTE — Progress Notes (Signed)
 Neurosurgery visit note Patient seen and examined.  He was sleeping at the time came in but was easily arousable.  He was oriented [facility] and to 2025 otherwise his cranial nerves appear grossly intact his flap is soft and not distended he moves all 4 extremities grossly full strength and sensation.  AP: Overall doing well continue to mobilize as you are doing helmet out of bed awaiting dispo  Belvie Ernest Felix MD Neurosurgery

## 2024-07-07 NOTE — Plan of Care (Signed)
  Problem: Clinical Measurements: Goal: Ability to maintain clinical measurements within normal limits will improve Outcome: Progressing Goal: Respiratory complications will improve Outcome: Progressing   Problem: Activity: Goal: Risk for activity intolerance will decrease Outcome: Progressing   Problem: Coping: Goal: Level of anxiety will decrease Outcome: Progressing

## 2024-07-07 NOTE — Consult Note (Signed)
 [facility] Psychiatric Consult Follow-up  Patient Name: .Ernest Romero  MRN: [id number]  DOB: Nov 19, 1965  Consult Order details:  Orders (From admission, onward)     Start     Ordered   07/01/24 2119  IP CONSULT TO PSYCHIATRY       Ordering Provider: Rust-Chester, Jenita CROME, NP  Provider:  (Not yet assigned)  Question Answer Comment  Location [facility]   Reason for Consult? bipolar disorder, need medication guidance      07/01/24 2118             Mode of Visit: In person    Psychiatry Consult Evaluation  Service Date: July 07, 2024 LOS:  LOS: 7 days  Chief Complaint Low lithium  levels  Primary Psychiatric Diagnoses  Bipolar disorder   Assessment  07/07/2024: Patient again was not able to engage in a meaningful interview. He is oriented to self only. He is frequently dozing throughout his assessment. Will continue to round to evaluate the need for lithium  dose adjustment.   07/06/2024: Ernest Romero is a 58 y.o. male admitted: Medically. Patient was assessed on 07/05/24 by psychiatry and was unable to engage in a meaningful interview due to status post left craniectomy on 06/30/24. Consult was requested by attending due to low lithium  levels. Patient is currently sleeping with sitter at bedside. Recently was given ativan  0.5 mg due to 'he got spicy and was fixated on the need to be discharged. Review of lithium  levels, he is historically at his current level of 0.39 Will continue to monitor behavior and mood and adjust according if needed. Also receiving phenobarbital as precaution following surgery.       Diagnoses:  Active Hospital problems: Principal Problem:   Subdural hematoma (HCC) Active Problems:   Bipolar disorder (HCC)   Essential hypertension   Malnutrition of moderate degree   Alcohol withdrawal (HCC)   Hypophosphatemia    Plan   ## Psychiatric Medication Recommendations:  Lithium  450 mg at bedtime Lurasidone    ## Medical  Decision Making Capacity: Not specifically addressed in this encounter  ## Further Work-up:   Lithium  level on 07/06/24 0.39, 6 days ago <0.10, 4 months ago <0.06   ## Disposition:-- There are no psychiatric contraindications to discharge at this time  ## Behavioral / Environmental: -Delirium Precautions: Delirium Interventions for Nursing and Staff: - RN to open blinds every AM. - To Bedside: Glasses, hearing aide, and pt's own shoes. Make available to patients. when possible and encourage use. - Encourage po fluids when appropriate, keep fluids within reach. - OOB to chair with meals. - Passive ROM exercises to all extremities with AM & PM care. - RN to assess orientation to person, time and place QAM and PRN. - Recommend extended visitation hours with familiar family/friends as feasible. - Staff to minimize disturbances at night. Turn off television when pt asleep or when not in use.    ## Safety and Observation Level:  - Based on my clinical evaluation, I estimate the patient to be at LOW risk of self harm in the current setting. - At this time, we recommend  routine. This decision is based on my review of the chart including patient's history and current presentation, interview of the patient, mental status examination, and consideration of suicide risk including evaluating suicidal ideation, plan, intent, suicidal or self-harm behaviors, risk factors, and protective factors. This judgment is based on our ability to directly address suicide risk, implement suicide prevention strategies, and develop  a safety plan while the patient is in the clinical setting. Please contact our team if there is a concern that risk level has changed.  CSSR Risk Category:   Suicide Risk Assessment: Patient has following modifiable risk factors for suicide: active mental illness (to encompass adhd, tbi, mania, psychosis, trauma reaction), which we are addressing by encouraging compliance with medications,  monitoring lithium  level. Patient has following non-modifiable or demographic risk factors for suicide: male gender Patient has the following protective factors against suicide: Access to outpatient mental health care  Thank you for this consult request. Recommendations have been communicated to the primary team.  We will continue to follow, monitor response to lithium  and cognitive status at this time.   Shanisha Lech B Demitrus Francisco, NP       History of Present Illness  Relevant Aspects of [facility]   Patient Report:  Ernest Romero is a 58 y.o. male admitted: Medically. Patient was assessed on 07/05/24 by psychiatry and was unable to engage in a meaningful interview due to status post left craniectomy on 06/30/24. Consult was requested by attending due to low lithium  levels. Patient is currently sleeping with sitter at bedside. Recently was given ativan  0.5 mg due to 'he got spicy and was fixated on the need to be discharged. Review of lithium  levels, he is historically at his current level of 0.39 Will continue to monitor behavior and mood and adjust according if needed. Also receiving phenobarbital as precaution following surgery.   Psych ROS:  Depression: documented hx Anxiety:  documented hx Mania (lifetime and current): documented hx Psychosis: (lifetime and current): unknown   Psychiatric and Social History  Psychiatric History:  Information collected from patient chart, nursing staff  Prev Dx/Sx: bipolar disorder Current Psych Provider: unknown Home Meds (current): lithium , lurasidone  Previous Med Trials: unknown Therapy: unknown  Prior Psych Hospitalization: unknown  Prior Self Harm: unknown Prior Violence: unknown  Family Psych History: unknown Family Hx suicide: unknown  Social History:   Educational Hx: unknown Occupational Hx: unknown Legal Hx: was incarcerated Living Situation: jail Spiritual Hx: unknown Access to weapons/lethal means: unknown   Substance  History Alcohol: unknown  Type of alcohol unknown Last Drink unknown Number of drinks per day unknown History of alcohol withdrawal seizures unknown History of DT's unknown Tobacco: unknown Illicit drugs: unknown Prescription drug abuse: unknown Rehab hx: unknown  Exam Findings  Physical Exam: I have reviewed and agree with previous exam Vital Signs:  Temp:  [97.6 F (36.4 C)-98.6 F (37 C)] 98.6 F (37 C) (10/05 0727) Pulse Rate:  [64-81] 64 (10/05 0727) Resp:  [16-20] 16 (10/05 0727) BP: (129-160)/(94-103) 129/94 (10/05 0727) SpO2:  [97 %-99 %] 97 % (10/05 0727) Weight:  [68 kg] 68 kg (10/05 0435) Blood pressure (!) 129/94, pulse 64, temperature 98.6 F (37 C), resp. rate 16, height 5' 10 (1.778 m), weight 68 kg, SpO2 97%. Body mass index is 21.51 kg/m.    Mental Status Exam: General Appearance: UTA  Orientation:  UTA  Memory:  UTA  Concentration:  UTA  Recall:  UTA  Attention  UTA  Eye Contact:  UTA  Speech:  UTAUTA  Language:  UTA  Volume:  UTA  Mood: UTA  Affect:  UTA  Thought Process:  UTA  Thought Content:  UTA  Suicidal Thoughts:  UTA  Homicidal Thoughts:  UTA  Judgement:  UTAUTA  Insight:  UTA  Psychomotor Activity:  UTA  Akathisia:  UTA  Fund of Knowledge:  UTA  Assets:  UTA  Cognition:  UTA  ADL's:  UTA  AIMS (if indicated):        Other History   These have been pulled in through the EMR, reviewed, and updated if appropriate.  Family History:  The patient's family history is not on file.  Medical History: Past Medical History:  Diagnosis Date   Anxiety    Bipolar 1 disorder (HCC)    Depression    Hepatitis    Hypertension     Surgical History: Past Surgical History:  Procedure Laterality Date   CRANIOTOMY Left 06/30/2024   Procedure: CRANIOTOMY HEMATOMA EVACUATION SUBDURAL;  Surgeon: Melonie Grass, MD;  Location: [facility] ORS;  Service: Neurosurgery;  Laterality: Left;     Medications:   Current  Facility-Administered Medications:    acetaminophen  (TYLENOL ) suppository 650 mg, 650 mg, Rectal, Q6H PRN, Nelson, Dana G, NP, 650 mg at 06/30/24 1952   amLODipine  (NORVASC ) tablet 5 mg, 5 mg, Oral, Daily, Ponnala, Shruthi, MD, 5 mg at 07/07/24 0955   Chlorhexidine Gluconate Cloth 2 % PADS 6 each, 6 each, Topical, Daily, Bousman, Karlie, PA-C, 6 each at 07/07/24 0955   docusate (COLACE) 50 MG/5ML liquid 100 mg, 100 mg, Oral, BID PRN, Rust-Chester, Jenita L, NP   enoxaparin  (LOVENOX ) injection 40 mg, 40 mg, Subcutaneous, Q24H, Wieting, Richard, MD, 40 mg at 07/06/24 1958   feeding supplement (ENSURE PLUS HIGH PROTEIN) liquid 237 mL, 237 mL, Oral, TID BM, Wieting, Richard, MD, 237 mL at 07/07/24 0955   folic acid  (FOLVITE ) tablet 1 mg, 1 mg, Oral, Daily, Rust-Chester, Jenita L, NP, 1 mg at 07/07/24 0955   lithium  carbonate (ESKALITH ) ER tablet 450 mg, 450 mg, Oral, QHS, Rust-Chester, Jenita L, NP, 450 mg at 07/06/24 2249   LORazepam  (ATIVAN ) injection 0.5 mg, 0.5 mg, Intravenous, Q4H PRN, Ponnala, Shruthi, MD, 0.5 mg at 07/07/24 1127   lurasidone  (LATUDA ) tablet 20 mg, 20 mg, Oral, Q breakfast, Rust-Chester, Jenita L, NP, 20 mg at 07/07/24 0752   multivitamin with minerals tablet 1 tablet, 1 tablet, Oral, Daily, Rust-Chester, Jenita CROME, NP, 1 tablet at 07/07/24 0955   Oral care mouth rinse, 15 mL, Mouth Rinse, PRN, Ponnala, Shruthi, MD   [COMPLETED] PHENobarbital (LUMINAL) tablet 97.2 mg, 97.2 mg, Oral, Q8H, 97.2 mg at 07/04/24 0454 **FOLLOWED BY** [COMPLETED] PHENobarbital (LUMINAL) tablet 64.8 mg, 64.8 mg, Oral, Q8H, 64.8 mg at 07/06/24 0417 **FOLLOWED BY** PHENobarbital (LUMINAL) tablet 32.4 mg, 32.4 mg, Oral, Q8H, Wieting, Richard, MD, 32.4 mg at 07/07/24 1142   polyethylene glycol (MIRALAX / GLYCOLAX) packet 17 g, 17 g, Oral, Daily PRN, Clair Marolyn NOVAK, RPH, 17 g at 07/07/24 0751   thiamine  (VITAMIN B1) tablet 100 mg, 100 mg, Oral, Daily, Chappell, Alex B, RPH, 100 mg at 07/07/24  9044  Allergies: No Known Allergies  Ethyn Schetter B Carmino Ocain, NP

## 2024-07-07 NOTE — Plan of Care (Signed)
   Problem: Health Behavior/Discharge Planning: Goal: Ability to manage health-related needs will improve Outcome: Progressing   Problem: Safety: Goal: Ability to remain free from injury will improve Outcome: Progressing   Problem: Skin Integrity: Goal: Risk for impaired skin integrity will decrease Outcome: Progressing

## 2024-07-07 NOTE — Progress Notes (Signed)
 Patient sleeping upon entry to room with 1:1 sitter at bedside. Per sitter, no behaviors noted. Patient frequently fixated on getting dope.  Patient awoken prior to nurse navigator leaving room. Patient oriented 1-2, varying during conversation. Patient able to state his mother's name is [employer] When asked if his mother is still alive, patient stated yes. Unable to provide address or phone number. Brendolyn noted for 2022 for this name.  Patient aware he is currently on probation/parole. Unsure as to who his supervising officer is. Provided permission (in front of sitter G. Flemmings) to contact department to obtain this information and attempt to secure a next of kin contact or emergency contact.  Patient also stated he was aware of multiple upcoming court dates.  During conversation patient stated he needed his wallet and other belongings which are likely still at the jail. Per chart notes previous attempt made to contact jail without results. Will follow up.

## 2024-07-07 NOTE — Progress Notes (Signed)
 Spoke with [facility], at patient's request, regarding belongings. Directed to call back tomorrow (Monday) and ask to speak with Officer Georgina. Will follow up tomorrow.

## 2024-07-07 NOTE — Progress Notes (Addendum)
 Progress Note   Patient: Ernest Romero FMW:[id number] DOB: 11-16-1965 DOA: 06/30/2024     7 DOS: the patient was seen and examined on 07/07/2024   Brief hospital course: 58 y.o. male who presented to [facility] ED for evaluation of a subdural hematoma. [facility] jail for the evaluation of unresponsiveness. Previous Head CT has shown small subdural hematomas over the past few weeks  According to the jail staff, the patient had been incarcerated for approximately 12 hours prior to this acute change in mental status. There were no signs to suggest trauma, injuries or falls.  Due to AMS, concern for airway protection and respiratory failure, pt was intubated in ER. CT with left SDH with significant midline shift, s/p left fronto parietal craniotomy by Neurosurgery. Hospital course as below  Assessment and Plan: Subdural hematoma (HCC) - Status post left frontal parietal craniotomy with evacuation of subdural hematoma and placement of a drain.  Drain removed on 9/29.   - Neurosurgical team placed an order for helmet.   - Continue working with PT and OT - Keppra discontinued as patient is on phenobarbital - Repeat CT in 6 weeks as outpatient, Neurosurgery follow up   Alcohol withdrawal (HCC) - Continue thiamine , folic acid  and phenobarbital taper - Ativan  prn, dose lowered as patient was very drowsy  Acute Encephalopathy - Improving - Suspect behavioral/agitation likely due to acute alcohol withdrawal - May also have some changes due to recent craniotomy, underlying bipolar disorder - SLP following for cognitive/lang eval - Has 1:1 sitter - On phenobarbital taper and Ativan  as needed as needed as above - Haldol  prn for agitation   Malnutrition of moderate degree - Continue supplements   Bipolar disorder (HCC) - Seen by Psychiatry, appreciate recs, on lithium  and Latuda    Essential hypertension - Amlodipine    Hypophosphatemia - Repleted  Social determinants - Due to  acute changes patient does not have decision-making capacity at this time, is AO 1-2, states we can reach out to peer support counsellor Foust (?[phone]) - tried calling the number, inaccurate - asked me call his mother, unable to provide any details - Unclear how much patient will improve and what his new baseline will be - TOC aware  PT/OT recommend Inpatient rehab    Subjective: Patient was examined at the bedside, mental status slowly improving Patient was sleeping on my exam, easily arousable.  Intermittent episodes of agitation but redirectable, required Ativan  and Haldol . P.o. intake also improving, has 1:1 sitter  Physical Exam: Vitals:   07/06/24 1932 07/07/24 0346 07/07/24 0435 07/07/24 0727  BP: (!) 160/101 (!) 130/103  (!) 129/94  Pulse: 81 76  64  Resp: 18 18  16   Temp: 97.8 F (36.6 C) 97.6 F (36.4 C)  98.6 F (37 C)  TempSrc:  Oral    SpO2: 99% 99%  97%  Weight:   68 kg   Height:       HENT:     Mouth/Throat:     Pharynx: No oropharyngeal exudate.  Eyes:     General: Lids are normal.  Cardiovascular:     Rate and Rhythm: Normal rate and regular rhythm.     Heart sounds: Normal heart sounds, S1 normal and S2 normal.  Pulmonary:     Breath sounds: No decreased breath sounds, wheezing, rhonchi or rales.  Abdominal:     Palpations: Abdomen is soft.     Tenderness: There is no abdominal tenderness.  Musculoskeletal:     Right lower  leg: No swelling.     Left lower leg: No swelling.  Skin:    General: Skin is warm.     Findings: No rash.  Neurological:     Mental Status: AO 1-2, but improving    Comments: Able to move all extremities   Data Reviewed: Labs and imaging reviewed  Family Communication: No contacts in computer  Disposition: Status is: Inpatient Remains inpatient appropriate because: Continue working with PT and OT.  Continue phenobarbital taper  Planned Discharge Destination: Rehab  Time spent: 35 minutes  Author: Laree Lock, MD 07/07/2024 2:17 PM  For on call review [email or web address].

## 2024-07-08 LAB — MAGNESIUM: Magnesium: 2.5 mg/dL — ABNORMAL HIGH (ref 1.7–2.4)

## 2024-07-08 LAB — BASIC METABOLIC PANEL WITH GFR
Anion gap: 9 (ref 5–15)
BUN: 19 mg/dL (ref 6–20)
CO2: 26 mmol/L (ref 22–32)
Calcium: 9.2 mg/dL (ref 8.9–10.3)
Chloride: 102 mmol/L (ref 98–111)
Creatinine, Ser: 0.84 mg/dL (ref 0.61–1.24)
GFR, Estimated: >60 mL/min (ref >60)
Glucose, Bld: 114 mg/dL — ABNORMAL HIGH (ref 70–99)
Potassium: 4 mmol/L (ref 3.5–5.1)
Sodium: 137 mmol/L (ref 135–145)

## 2024-07-08 LAB — GLUCOSE, CAPILLARY
Glucose-Capillary: 110 mg/dL — ABNORMAL HIGH (ref 70–99)
Glucose-Capillary: 88 mg/dL (ref 70–99)
Glucose-Capillary: 106 mg/dL — ABNORMAL HIGH (ref 70–99)
Glucose-Capillary: 130 mg/dL — ABNORMAL HIGH (ref 70–99)
Glucose-Capillary: 132 mg/dL — ABNORMAL HIGH (ref 70–99)

## 2024-07-08 LAB — PHOSPHORUS: Phosphorus: 3.3 mg/dL (ref 2.5–4.6)

## 2024-07-08 NOTE — Progress Notes (Signed)
 Spoke with Officer [place] at [facility] who stated Officer Georgina should be back around lunch time and to attempt to call then.

## 2024-07-08 NOTE — Progress Notes (Signed)
 Reached out to [facility] security. Will check to see if patient belongings ended up in security or in locked unit locker room.

## 2024-07-08 NOTE — Progress Notes (Signed)
 Physical Therapy Treatment Patient Details Name: Ernest Romero MRN: [id number] DOB: 08-25-1966 Today's Date: 07/08/2024   History of Present Illness Pt is a 58 year old male presenting from jail after found down,  found to have a large left sided SDH with shift s/p left craniectomy 06/30/24    PMH significant for PMH alcohol use disorder, psychiatric disorder    PT Comments  Pt seen for PT tx with pt received in bed, agreeable to tx with encouragement. Pt requires min assist to come to sitting EOB, min assist for sit>stand. Attempted gait but pt with decreased balance, global weakness noted, sitter reporting pt received meds earlier. Pt required +2 assist HHA to ambulate around bed to recliner. Pt quickly closing eyes but then reporting he's a little hungry. PT assisted with meal tray set up & pt self feeding with sitter present upon PT departure. Continue to recommend ongoing PT services to progress mobility as able.    If plan is discharge home, recommend the following: A lot of help with walking and/or transfers;A lot of help with bathing/dressing/bathroom;Assistance with cooking/housework;Direct supervision/assist for financial management;Supervision due to cognitive status;Help with stairs or ramp for entrance;Direct supervision/assist for medications management;Assist for transportation   Can travel by private vehicle        Equipment Recommendations  Other (comment) (TBD)    Recommendations for Other Services       Precautions / Restrictions Precautions Precautions: Fall Recall of Precautions/Restrictions: Impaired Precaution/Restrictions Comments: helmet, ok to mobilize with therapy prior to delivery per neurosurgical team Restrictions Weight Bearing Restrictions Per Provider Order: No     Mobility  Bed Mobility Overal bed mobility: Needs Assistance Bed Mobility: Rolling Rolling: Supervision (R sidelying>L sidelying)   Supine to sit: Min assist (exit L side of bed, holds to  PT's hand)          Transfers Overall transfer level: Needs assistance Equipment used: 1 person hand held assist Transfers: Sit to/from Stand Sit to Stand: Min assist                Ambulation/Gait Ambulation/Gait assistance: Mod assist, Min assist, +2 physical assistance Gait Distance (Feet): 10 Feet Assistive device: 2 person hand held assist Gait Pattern/deviations: Decreased step length - right, Decreased dorsiflexion - right, Decreased step length - left, Decreased dorsiflexion - left, Decreased stride length Gait velocity: decreased     General Gait Details: dragging L foot, ambulates around bed to recliner   Stairs             Wheelchair Mobility     Tilt Bed    Modified Rankin (Stroke Patients Only)       Balance Overall balance assessment: Needs assistance Sitting-balance support: Feet supported Sitting balance-Leahy Scale: Fair Sitting balance - Comments: dons socks sitting EOB without LOB close supervision   Standing balance support: Bilateral upper extremity supported, During functional activity Standing balance-Leahy Scale: Poor                              Communication Communication Communication: Impaired Factors Affecting Communication: Reduced clarity of speech  Cognition Arousal: Lethargic, Suspect due to medications Behavior During Therapy: Flat affect   PT - Cognitive impairments: No family/caregiver present to determine baseline, Awareness, Memory, Attention, Initiation, Safety/Judgement, Problem solving, Sequencing                   [place] Levels of Cognitive Functioning [place] [address] Scales of  Cognitive Functioning: Confused/Agitated: Maximal Assistance [facility] Scales of Cognitive Functioning: Confused/Agitated: Maximal Assistance [IV]   Following commands: Impaired Following commands impaired: Follows one step commands inconsistently, Follows one step commands with increased time     Cueing Cueing Techniques: Verbal cues, Gestural cues, Tactile cues, Visual cues  Exercises      General Comments        Pertinent Vitals/Pain Pain Assessment Pain Assessment: Faces Breathing: normal Negative Vocalization: none Facial Expression: smiling or inexpressive Body Language: relaxed Consolability: no need to console PAINAD Score: 0    Home Living                          Prior Function            PT Goals (current goals can now be found in the care plan section) Acute Rehab PT Goals Patient Stated Goal: to walk, to go home PT Goal Formulation: With patient Time For Goal Achievement: 07/16/24 Potential to Achieve Goals: Fair Progress towards PT goals: Progressing toward goals    Frequency    Min 3X/week      PT Plan      Co-evaluation              AM-PAC PT 6 Clicks Mobility   Outcome Measure  Help needed turning from your back to your side while in a flat bed without using bedrails?: A Little Help needed moving from lying on your back to sitting on the side of a flat bed without using bedrails?: A Little Help needed moving to and from a bed to a chair (including a wheelchair)?: A Lot Help needed standing up from a chair using your arms (e.g., wheelchair or bedside chair)?: A Lot Help needed to walk in hospital room?: Total Help needed climbing 3-5 steps with a railing? : Total 6 Click Score: 12    End of Session Equipment Utilized During Treatment:  (helmet) Activity Tolerance: Patient limited by lethargy Patient left: in chair;with call bell/phone within reach;with nursing/sitter in room   PT Visit Diagnosis: Other symptoms and signs involving the nervous system (R29.898);Difficulty in walking, not elsewhere classified (R26.2);Muscle weakness (generalized) (M62.81);Other abnormalities of gait and mobility (R26.89);Unsteadiness on feet (R26.81)     Time: 8587-8576 PT Time Calculation (min) (ACUTE ONLY): 11 min  Charges:     $Therapeutic Activity: 8-22 mins PT General Charges $$ ACUTE PT VISIT: 1 Visit                     Richerd Pinal, PT, DPT 07/08/24, 2:33 PM   Richerd CHRISTELLA Pinal 07/08/2024, 2:31 PM

## 2024-07-08 NOTE — Progress Notes (Signed)
 Speech Language Pathology Treatment: Cognitive-Linguistic  Patient Details Name: Ernest Romero MRN: [id number] DOB: Jan 22, 1966 Today's Date: 07/08/2024 Time: 0920-0945 SLP Time Calculation (min) (ACUTE ONLY): 25 min  Assessment / Plan / Recommendation Clinical Impression  Pt seen for follow up cognitive communication intervention, targeting attention training and dynamic assessment for safety awareness and functional recall. Internal distraction/perseveration noted with need to use bathroom, requiring repeated verbal redirection. Pt standing for attempt to use urinal with moderate verbal cues for awareness/use of walker and redirection to task. Location orientation remains limited, though verbal/visual cues provided for redirection. Limited recall of safety recommendations and current mobility restrictions (pt wanting to walk independently). Overall cognitive presentation remains impaired, though stable. Pt continues to benefit from direct, clear instructions, repetition, minimizing distractions, and visual cues. SLP will continue to follow acutely- pt will benefit from skilled services at next level of care.    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

## 2024-07-08 NOTE — Progress Notes (Signed)
 Pts psychiatrist- Dr Daniel [phone] Pts support [place] [name] [phone]

## 2024-07-08 NOTE — Progress Notes (Signed)
 Spoke with Lt. Moore who stated Lt. Sterling personally brought patient's belongings to ICU. Unable to remember specific belongings. Will follow up with ICU.

## 2024-07-08 NOTE — TOC Progression Note (Signed)
 Transition of Care [facility]) - Progression Note    Patient Details  Name: Ernest Romero MRN: [id number] Date of Birth: 1966-07-27  [facility] of [facility] CM/SW Contact  Delphine KANDICE Bring, RN Phone Number: 07/08/2024, 2:26 PM  Clinical Narrative:    On 07/05/24 CM received a call from Ryan at [name]. He states that the report was accepted and [name] [phone]  is assigned to his case. She was suppose to see patient on 10/3.                       Expected Discharge Plan and Services                                               Social Drivers of Health (SDOH) Interventions SDOH Screenings   Food Insecurity: Patient Declined (07/02/2024)  Housing: Patient Unable To Answer (07/02/2024)  Transportation Needs: Patient Declined (07/02/2024)  Utilities: Patient Unable To Answer (07/02/2024)  Alcohol Screen: Low Risk  (08/30/2022)  Depression (PHQ2-9): Medium Risk (08/30/2022)  Tobacco Use: Medium Risk (06/30/2024)    Readmission Risk Interventions     No data to display

## 2024-07-08 NOTE — Progress Notes (Addendum)
 Progress Note   Patient: Ernest Romero FMW:[id number] DOB: Feb 13, 1966 DOA: 06/30/2024     8 DOS: the patient was seen and examined on 07/08/2024   Brief hospital course: 58 y.o. male who presented to [facility] ED for evaluation of a subdural hematoma. Patient presented from local jail for the evaluation of unresponsiveness. Previous Head CT has shown small subdural hematomas over the past few weeks  According to the jail staff, the patient had been incarcerated for approximately 12 hours prior to this acute change in mental status. There were no signs to suggest trauma, injuries or falls.  Due to AMS, concern for airway protection and respiratory failure, pt was intubated in ER. CT with left SDH with significant midline shift, s/p left fronto parietal craniotomy by Neurosurgery. Hospital course as below  Assessment and Plan: Subdural hematoma (HCC) - Status post left frontal parietal craniotomy with evacuation of subdural hematoma and placement of a drain.  Drain removed on 9/29.   - Neurosurgical team placed an order for helmet.   - Continue working with PT and OT - Keppra discontinued as patient is on phenobarbital - Repeat CT in 6 weeks as outpatient, Neurosurgery follow up   Alcohol withdrawal (HCC) - Continue thiamine , folic acid . S/p phenobarbital taper - Ativan  prn  Acute Encephalopathy - Improving - Suspect behavioral/agitation likely due to acute alcohol withdrawal - May also have some changes due to recent craniotomy, underlying bipolar disorder - SLP following for cognitive/lang eval - Has 1:1 sitter, wean as able - Haldol  prn for agitation   Malnutrition of moderate degree - Continue supplements   Bipolar disorder (HCC) - Seen by Psychiatry, appreciate recs, on lithium  and Latuda  - Reportedly follows up with psychiatry, Dr.Su [phone])   Essential hypertension - Amlodipine    Hypophosphatemia - Repleted  Social determinants - Due to acute changes patient does not  have decision-making capacity at this time, is AO x 2, states we can reach out to peer support counsellor Foust (?[phone]) - tried calling the number, inaccurate - states does not have kids, his spouse passed away - TOC aware  Addendum: Patient's support counselor [name] was at the bedside - [phone]  PT/OT recommend Inpatient rehab  Subjective: Patient was examined at the bedside, mental status slowly improving Patient was sleeping on my exam, easily arousable.  Intermittent episodes of agitation but redirectable P.o. intake also improving, has 1:1 sitter  Physical Exam: Vitals:   07/08/24 0419 07/08/24 0444 07/08/24 0500 07/08/24 0835  BP:  129/89  (!) 138/98  Pulse:  71  64  Resp: 18   17  Temp:  98.3 F (36.8 C)  97.7 F (36.5 C)  TempSrc:  Axillary    SpO2:  97%  98%  Weight:   67 kg   Height:       HENT:     Mouth/Throat:     Pharynx: No oropharyngeal exudate.  Eyes:     General: Lids are normal.  Cardiovascular:     Rate and Rhythm: Normal rate and regular rhythm.     Heart sounds: Normal heart sounds, S1 normal and S2 normal.  Pulmonary:     Breath sounds: No decreased breath sounds, wheezing, rhonchi or rales.  Abdominal:     Palpations: Abdomen is soft.     Tenderness: There is no abdominal tenderness.  Musculoskeletal:     Right lower leg: No swelling.     Left lower leg: No swelling.  Skin:    General: Skin is  warm.     Findings: No rash.  Neurological:     Mental Status: AO x 2, but improving    Comments: Able to move all extremities   Data Reviewed: Labs and imaging reviewed  Family Communication: No contacts in computer  Disposition: Status is: Inpatient Remains inpatient appropriate because: Continue working with PT and OT.  Continue phenobarbital taper  Planned Discharge Destination: Rehab  Time spent: 35 minutes  Author: Laree Lock, MD 07/08/2024 12:59 PM  For on call review [email or web address].

## 2024-07-08 NOTE — Progress Notes (Signed)
   Neurosurgery Progress Note  History: Ernest Romero is s/p left craniectomy for SDH  POD7: NAEO. Pt was transferred to floor   POD4: Pt remains intermittently agitated  POD3: Concerns overnight for EtOH withdrawal.  POD2:  Pt extubated yesterday. Pt complaining about mitts this morning. Denying headache  POD1: Pt reported to be following commands when off sedation.   Physical Exam: Vitals:   07/08/24 0419 07/08/24 0444  BP:  129/89  Pulse:  71  Resp: 18   Temp:  98.3 F (36.8 C)  SpO2:  97%   PERRL   Drowsy but arouses easily. Oriented to self MAEW.  Incision covered with clean dressing. Craniectmy site is soft.   Data:  Other tests/results:  CT head 9/28  FINDINGS:   BRAIN AND VENTRICLES: Left frontoparietal craniotomy was performed. The subdural hematoma was evacuated. A drain is in place. Minimal residual blood products remain in the extra-axial space. Pneumocephalus is present. Midline shift is improved now measuring 9 mm. No new hemorrhage is present. No evidence of acute infarct. No hydrocephalus.   ORBITS: No acute abnormality.   SINUSES: No acute abnormality.   SOFT TISSUES AND SKULL: Left frontoparietal craniotomy was performed. A drain is in place. No acute soft tissue abnormality. No skull fracture.   IMPRESSION: 1. Status post left frontoparietal craniotomy with evacuation of subdural hematoma and placement of a drain. 2. Minimal residual blood products in the extra-axial space and pneumocephalus. 3. Improved midline shift, now measuring 9 mm. 4. No new hemorrhage.  Assessment/Plan:  TYAN DY is a 58 y.o presenting with AMS found to have a large left sided SDH with shift s/p left craniectomy.  - mental status improved compared to last week but patient remains disoriented  - Drain removed 9/29.  - will continue to monitor exam - d/c Keppra given phenobarbital.  - Staples can be removed on POD14 - remainder of care per CC  team  Edsel Goods PA-C Department of Neurosurgery

## 2024-07-08 NOTE — Progress Notes (Signed)
 Psychiatry attempted to round on patient today- patient was sleeping and unable to participate in assessment. Sitter remained at bedside with patient. Psychiatry will continue to attempt to assess patient when more alert.

## 2024-07-08 NOTE — Plan of Care (Signed)
  Problem: Education: Goal: Knowledge of General Education information will improve Description: Including pain rating scale, medication(s)/side effects and non-pharmacologic comfort measures Outcome: Progressing   Problem: Coping: Goal: Level of anxiety will decrease Outcome: Progressing   Problem: Elimination: Goal: Will not experience complications related to bowel motility Outcome: Progressing   Problem: Safety: Goal: Ability to remain free from injury will improve Outcome: Progressing   Problem: Skin Integrity: Goal: Risk for impaired skin integrity will decrease Outcome: Progressing   

## 2024-07-08 NOTE — Plan of Care (Signed)
  Problem: Clinical Measurements: Goal: Ability to maintain clinical measurements within normal limits will improve Outcome: Progressing Goal: Respiratory complications will improve Outcome: Progressing   Problem: Nutrition: Goal: Adequate nutrition will be maintained Outcome: Progressing   Problem: Elimination: Goal: Will not experience complications related to bowel motility Outcome: Progressing

## 2024-07-08 NOTE — Progress Notes (Signed)
 Occupational Therapy Treatment Patient Details Name: Ernest Romero MRN: [id number] DOB: [birth year] Today's Date: 07/08/2024   History of present illness Pt is a 58 year old male presenting from jail after found down,  found to have a large left sided SDH with shift s/p left craniectomy 06/30/24    PMH significant for PMH alcohol use disorder, psychiatric disorder   OT comments  Upon entering the room, pt supine in bed and sleeping soundly with sitter present in room. Pt wakes easily this session and is agreeable. Min A for supine >sit. He does not want to wear helmet but allows therapist to place it on him. Pt begins to perseverate on having his shoes on which he has not had since hospital admission. OT redirecting pt throughout session but he ambulates 46' with min - mod HHA to nurses station and asks secretary about his shoes. With prompting he gives a description of shoes. I am unsure if the description is accurate but he gave description on his own. He then was able to be redirected back to his room after reporting he was feeling tired. Pt sitting on EOB and when asked to move closer to [facility] he did so with several min A lateral scoots. Helmet removed and pt returned to supine with min A. Pt falling asleep as therapist exits the room. Sitter present in room and all needs within reach.       If plan is discharge home, recommend the following:  A lot of help with bathing/dressing/bathroom;A lot of help with walking and/or transfers;Supervision due to cognitive status   Equipment Recommendations  Other (comment) (defer to next venue of care)       Precautions / Restrictions Precautions Precautions: Fall       Mobility Bed Mobility Overal bed mobility: Needs Assistance Bed Mobility: Supine to Sit, Sit to Supine     Supine to sit: Min assist Sit to supine: Min assist        Transfers Overall transfer level: Needs assistance Equipment used: 1 person hand held assist Transfers: Sit  to/from Stand Sit to Stand: Min assist                 Balance Overall balance assessment: Needs assistance Sitting-balance support: Feet supported Sitting balance-Leahy Scale: Fair Sitting balance - Comments: CGA- min A EOB   Standing balance support: During functional activity, Single extremity supported Standing balance-Leahy Scale: Poor                             ADL either performed or assessed with clinical judgement   ADL Overall ADL's : Needs assistance/impaired                                       General ADL Comments: assistance to don helmet              Communication Communication Communication: Impaired Factors Affecting Communication: Reduced clarity of speech   Cognition Arousal: Alert Behavior During Therapy: Restless, Impulsive Cognition: No family/caregiver present to determine baseline, Cognition impaired, Rancho level   Orientation impairments: Place, Time, Situation Awareness: Intellectual awareness impaired, Online awareness impaired Memory impairment (select all impairments): Short-term memory, Declarative long-term memory, Non-declarative long-term memory Attention impairment (select first level of impairment): Focused attention Executive functioning impairment (select all impairments): Initiation, Organization, Sequencing, Reasoning, Problem solving  Rancho [employer] Scales of Cognitive Functioning: Confused/Agitated: Maximal Assistance [IV] Following commands: Impaired Following commands impaired: Follows one step commands inconsistently, Follows one step commands with increased time      Cueing   Cueing Techniques: Verbal cues, Gestural cues, Tactile cues, Visual cues             Pertinent Vitals/ Pain       Pain Assessment Pain Assessment: No/denies pain         Frequency  Min 3X/week        Progress Toward Goals  OT Goals(current goals can now be found in the care  plan section)  Progress towards OT goals: Progressing toward goals      AM-PAC OT 6 Clicks Daily Activity     Outcome Measure   Help from another person eating meals?: A Little Help from another person taking care of personal grooming?: A Lot Help from another person toileting, which includes using toliet, bedpan, or urinal?: A Lot Help from another person bathing (including washing, rinsing, drying)?: A Lot Help from another person to put on and taking off regular upper body clothing?: A Lot Help from another person to put on and taking off regular lower body clothing?: A Lot 6 Click Score: 13    End of Session    OT Visit Diagnosis: Other abnormalities of gait and mobility (R26.89);Muscle weakness (generalized) (M62.81);Other symptoms and signs involving the nervous system (R29.898);Cognitive communication deficit (R41.841);Other symptoms and signs involving cognitive function   Activity Tolerance Patient tolerated treatment well;Patient limited by fatigue   Patient Left in bed;with call bell/phone within reach;with bed alarm set;with nursing/sitter in room   Nurse Communication          Time: 8973-8955 OT Time Calculation (min): 18 min  Charges: OT General Charges $OT Visit: 1 Visit OT Treatments $Therapeutic Activity: 8-22 mins  Izetta Claude, MS, OTR/L , CBIS ascom [phone]  07/08/24, 2:24 PM

## 2024-07-09 DIAGNOSIS — F10239 Alcohol dependence with withdrawal, unspecified: Secondary | ICD-10-CM

## 2024-07-09 LAB — GLUCOSE, CAPILLARY
Glucose-Capillary: 117 mg/dL — ABNORMAL HIGH (ref 70–99)
Glucose-Capillary: 106 mg/dL — ABNORMAL HIGH (ref 70–99)
Glucose-Capillary: 111 mg/dL — ABNORMAL HIGH (ref 70–99)
Glucose-Capillary: 109 mg/dL — ABNORMAL HIGH (ref 70–99)
Glucose-Capillary: 110 mg/dL — ABNORMAL HIGH (ref 70–99)
Glucose-Capillary: 69 mg/dL — ABNORMAL LOW (ref 70–99)

## 2024-07-09 MED ORDER — PROSOURCE PLUS PO LIQD
30.0000 mL | Freq: Two times a day (BID) | ORAL | Status: DC
  Administered 2024-07-10 – 2024-07-15 (×9): 30 mL via ORAL

## 2024-07-09 MED ORDER — NICOTINE 21 MG/24HR TD PT24
21.0000 mg | MEDICATED_PATCH | Freq: Every day | TRANSDERMAL | Status: DC
Start: 2024-07-09 — End: 2024-07-15
  Administered 2024-07-09 – 2024-07-15 (×7): 21 mg via TRANSDERMAL
  Filled 2024-07-09 (×7): qty 1

## 2024-07-09 MED ORDER — LURASIDONE HCL 40 MG PO TABS
40.0000 mg | ORAL_TABLET | Freq: Every day | ORAL | Status: DC
  Administered 2024-07-10 – 2024-07-15 (×6): 40 mg via ORAL
  Filled 2024-07-09 (×7): qty 1

## 2024-07-09 NOTE — Progress Notes (Signed)
   Inpatient Rehabilitation Admissions Coordinator   I received a call from Deputy Moore at [facility]. States patient was on probation and [facility] contacts may be obtained through his Engineer, drilling. Phone [phone]. I provided TOC, Zenobia, this information for follow up.  Heron Leavell, RN, MSN Rehab Admissions Coordinator [phone] 07/09/2024 12:52 PM

## 2024-07-09 NOTE — Progress Notes (Signed)
 Progress Note   Patient: Ernest Romero FMW:[id number] DOB: 28-Apr-1966 DOA: 06/30/2024     9 DOS: the patient was seen and examined on 07/09/2024   Brief hospital course: 58 y.o. male who presented to [facility] ED for evaluation of a subdural hematoma. Patient presented from local jail for the evaluation of unresponsiveness. Previous Head CT has shown small subdural hematomas over the past few weeks  According to the jail staff, the patient had been incarcerated for approximately 12 hours prior to this acute change in mental status. There were no signs to suggest trauma, injuries or falls.  Due to AMS, concern for airway protection and respiratory failure, pt was intubated in ER. CT with left SDH with significant midline shift, s/p left fronto parietal craniotomy by Neurosurgery. Hospital course as below   Subdural hematoma (HCC) - Status post left frontal parietal craniotomy with evacuation of subdural hematoma and placement of a drain.  Drain removed on 9/29.   - Neurosurgical team placed an order for helmet.   - Continue working with PT and OT - Keppra discontinued as patient is on phenobarbital - Repeat CT in 6 weeks as outpatient, Neurosurgery follow up   Alcohol withdrawal (HCC) - Continue thiamine , folic acid . S/p phenobarbital taper - Ativan  prn  Acute Encephalopathy - Improving - Suspect behavioral/agitation likely due to acute alcohol withdrawal - May also have some changes due to recent craniotomy, underlying bipolar disorder - SLP following for cognitive/lang eval - Has 1:1 sitter, wean as able - Haldol  prn for agitation, Psych following   Malnutrition of moderate degree - Continue supplements   Bipolar disorder - Seen by Psychiatry, appreciate recs, on lithium  and inc Latuda40mg  - Reportedly follows up with psychiatry, Dr.Su [phone])   Essential hypertension - Amlodipine    Hypophosphatemia - Repleted  Social determinants - Due to acute changes patient does not  have decision-making capacity at this time, is AO x 2, Patient's support counselor [name] was at the bedside - [phone] - states does not have kids, his spouse passed away. Has sister, estranged. Will need guardianship - TOC aware  PT/OT recommend Inpatient rehab  Subjective: Patient was examined at the bedside, mental status slowly improving Patient was sleeping on my exam, easily arousable.  Intermittent episodes of agitation, requiring pharmacological interventions P.o. intake also improving, has 1:1 sitter  Physical Exam: Vitals:   07/08/24 0835 07/08/24 1642 07/09/24 0438 07/09/24 0839  BP: (!) 138/98 (!) 148/89 (!) 135/98 (!) 129/96  Pulse: 64 71 69 64  Resp: 17 20 20 17   Temp: 97.7 F (36.5 C) (!) 97.5 F (36.4 C) 97.6 F (36.4 C) (!) 97.3 F (36.3 C)  TempSrc:  Oral  Oral  SpO2: 98% 100% 97% 92%  Weight:      Height:       HENT:     Mouth/Throat:     Pharynx: No oropharyngeal exudate.  Eyes:     General: Lids are normal.  Cardiovascular:     Rate and Rhythm: Normal rate and regular rhythm.     Heart sounds: Normal heart sounds, S1 normal and S2 normal.  Pulmonary:     Breath sounds: No decreased breath sounds, wheezing, rhonchi or rales.  Abdominal:     Palpations: Abdomen is soft.     Tenderness: There is no abdominal tenderness.  Musculoskeletal:     Right lower leg: No swelling.     Left lower leg: No swelling.  Skin:    General: Skin is warm.  Findings: No rash.  Neurological:     Mental Status: AO x 2, but improving    Comments: Able to move all extremities   Data Reviewed: Labs and imaging reviewed  Family Communication: No contacts in computer  Disposition: Status is: Inpatient Remains inpatient appropriate because: Continue working with PT and OT.  Continue phenobarbital taper  Planned Discharge Destination: Rehab  Time spent: 35 minutes  Author: Laree Lock, MD 07/09/2024 2:21 PM  For on call review [email or web address].

## 2024-07-09 NOTE — Progress Notes (Signed)
 Nutrition Follow-up  DOCUMENTATION CODES:   Non-severe (moderate) malnutrition in context of social or environmental circumstances  INTERVENTION:   -Continue dysphagia 3 diet with thin liquids -Continue Ensure Plus High Protein po TID, each supplement provides 350 kcal and 20 grams of protein  -Continue Magic cup TID with meals, each supplement provides 290 kcal and 9 grams of protein  -30 ml Prosource Plus BID, each supplement provides 100 kcals and 15 grams protein -Continue 100 mg thiamine  daily -Continue 1 mg folic acid  daily -Continue MVI with minerals daily  NUTRITION DIAGNOSIS:   Moderate Malnutrition related to social / environmental circumstances as evidenced by moderate fat depletion, severe fat depletion, moderate muscle depletion, severe muscle depletion.  Ongoing  GOAL:   Patient will meet greater than or equal to 90% of their needs  Progressing   MONITOR:   PO intake, Supplement acceptance, Labs, Weight trends, I & O's, Skin  REASON FOR ASSESSMENT:   Ventilator    ASSESSMENT:   58 y/o male with past medical history of alcohol and drug abuse, HTN, bipolar disorder, anxiety, depression and hepatitis who presents from jail with left SDH with midline shift now status post craniotomy with JP drain (now removed) on 9/28.  9/29- extubated, NGT d/c 9/30- advanced to dysphagia 3 diet with thin liquids  Reviewed I/O's: -1.3 L x 24 hours and -4.6 L since admission  UOP: 1.3 L x 24 hours  Pt sitting up in bed at time of visit. Safety sitter at bedside.   Pt with fair to poor oral intake. Noted meal completions 0-50%. Pt has been drinking Ensure supplements.   Reviewed wt hx; pt has experienced a 7.8% wt loss over the past week. Suspect some wt loss may be related to fluid losses (4.6 L since admission)  Per TOC notes, DSS report has been filed.   Medications reviewed and include lovenox , folic acid , MVI, lithium , and thiamine .   Labs reviewed: CBGS: 88-130  (inpatient orders for glycemic control are none).    Diet Order:   Diet Order             DIET DYS 3 Room service appropriate? Yes; Fluid consistency: Thin  Diet effective now                   EDUCATION NEEDS:   No education needs have been identified at this time  Skin:  Skin Assessment: Skin Integrity Issues: Skin Integrity Issues:: Incisions Incisions: closed surgical incision to lt head  Last BM:  07/09/24 (type 2)  Height:   Ht Readings from Last 1 Encounters:  06/30/24 5' 10 (1.778 m)    Weight:   Wt Readings from Last 1 Encounters:  07/08/24 67 kg    Ideal Body Weight:  75.45 kg  BMI:  Body mass index is 21.19 kg/m.  Estimated Nutritional Needs:   Kcal:  2100-2400kcal/day  Protein:  105-120g/day  Fluid:  2.5-2.5L/day    Margery ORN, RD, LDN, CDCES Registered Dietitian III Certified Diabetes Care and Education Specialist If unable to reach this RD, please use RD Inpatient group chat on secure chat between hours of 8am-4 pm daily

## 2024-07-09 NOTE — Plan of Care (Signed)
  Problem: Clinical Measurements: Goal: Will remain free from infection Outcome: Progressing Goal: Respiratory complications will improve Outcome: Progressing Goal: Cardiovascular complication will be avoided Outcome: Progressing   Problem: Activity: Goal: Risk for activity intolerance will decrease Outcome: Progressing   Problem: Nutrition: Goal: Adequate nutrition will be maintained Outcome: Progressing   Problem: Coping: Goal: Level of anxiety will decrease Outcome: Progressing   Problem: Elimination: Goal: Will not experience complications related to bowel motility Outcome: Progressing Goal: Will not experience complications related to urinary retention Outcome: Progressing   Problem: Pain Managment: Goal: General experience of comfort will improve and/or be controlled Outcome: Progressing   Problem: Safety: Goal: Ability to remain free from injury will improve Outcome: Progressing  Pt remains with a 1:1 safety sitter Problem: Skin Integrity: Goal: Risk for impaired skin integrity will decrease Outcome: Progressing

## 2024-07-09 NOTE — Consult Note (Signed)
 [facility] Psychiatric Consult Follow-up  Patient Name: .Ernest Romero  MRN: [id number]  DOB: [birth year]  Consult Order details:  Orders (From admission, onward)     Start     Ordered   07/01/24 2119  IP CONSULT TO PSYCHIATRY       Ordering Provider: Rust-Chester, Jenita CROME, NP  Provider:  (Not yet assigned)  Question Answer Comment  Location [facility]   Reason for Consult? bipolar disorder, need medication guidance      07/01/24 2118             Mode of Visit: In person    Psychiatry Consult Evaluation  Service Date: July 09, 2024 LOS:  LOS: 9 days  Chief Complaint Low lithium  levels  Primary Psychiatric Diagnoses  Bipolar disorder   Assessment  07/07/2024: Patient again was not able to engage in a meaningful interview. He is oriented to self only. He is frequently dozing throughout his assessment. Will continue to round to evaluate the need for lithium  dose adjustment.   07/06/2024: Ernest Romero is a 58 y.o. male admitted: Medically. Patient was assessed on 07/05/24 by psychiatry and was unable to engage in a meaningful interview due to status post left craniectomy on 06/30/24. Consult was requested by attending due to low lithium  levels. Patient is currently sleeping with sitter at bedside. Recently was given ativan  0.5 mg due to 'he got spicy and was fixated on the need to be discharged. Review of lithium  levels, he is historically at his current level of 0.39 Will continue to monitor behavior and mood and adjust according if needed. Also receiving phenobarbital as precaution following surgery.   07/09/2024: Today on assessment, patient is more participative in exam.  Patient was still noted to be sleepy during exam, but participated in most of exam until nodding off to sleep towards end of exam today.  Patient was alert to self but disoriented to situation and time.  Patient reported he was currently in [place]  and the year was 5.  He  reported remembering being in jail, but did not remember what he did prior to going to jail or what occurred at jail for him to be admitted to hospital.  Patient did report feelings of irritability.  When asked about his current medication regimen, patient was unsure of what he was taking.  We informed him that currently he is on lithium  and Latuda .  Patient was okay with making adjustments to Latuda  today to attempt to target increased irritability.  He denied any suicidal or homicidal ideations.  He denied any auditory or visual hallucinations as well.  Patient is still requiring assistance with ADLs and a one-to-one sitter remains at bedside.  Assessment findings and plan relayed to medical team.    Diagnoses:  Active Hospital problems: Principal Problem:   Subdural hematoma (HCC) Active Problems:   Bipolar disorder (HCC)   Essential hypertension   Malnutrition of moderate degree   Alcohol withdrawal (HCC)   Hypophosphatemia    Plan   ## Psychiatric Medication Recommendations:  Lithium  450 mg at bedtime Lurasidone    ## Medical Decision Making Capacity: Not specifically addressed in this encounter  ## Further Work-up:   Lithium  level on 07/06/24 0.39, 6 days ago <0.10, 4 months ago <0.06   ## Disposition:-- Patient does not meet criteria for inpatient psyhiatric admission at this time  ## Behavioral / Environmental: -Delirium Precautions: Delirium Interventions for Nursing and Staff: - RN to open blinds every AM. - To  Bedside: Glasses, hearing aide, and pt's own shoes. Make available to patients. when possible and encourage use. - Encourage po fluids when appropriate, keep fluids within reach. - OOB to chair with meals. - Passive ROM exercises to all extremities with AM & PM care. - RN to assess orientation to person, time and place QAM and PRN. - Recommend extended visitation hours with familiar family/friends as feasible. - Staff to minimize disturbances at night. Turn off television  when pt asleep or when not in use.    ## Safety and Observation Level:  - Based on my clinical evaluation, I estimate the patient to be at LOW risk of self harm in the current setting. - At this time, we recommend  routine. This decision is based on my review of the chart including patient's history and current presentation, interview of the patient, mental status examination, and consideration of suicide risk including evaluating suicidal ideation, plan, intent, suicidal or self-harm behaviors, risk factors, and protective factors. This judgment is based on our ability to directly address suicide risk, implement suicide prevention strategies, and develop a safety plan while the patient is in the clinical setting. Please contact our team if there is a concern that risk level has changed.  CSSR Risk Category:C-SSRS RISK CATEGORY: No Risk  Suicide Risk Assessment: Patient has following modifiable risk factors for suicide: active mental illness (to encompass adhd, tbi, mania, psychosis, trauma reaction), which we are addressing by encouraging compliance with medications, monitoring lithium  level. Patient has following non-modifiable or demographic risk factors for suicide: male gender Patient has the following protective factors against suicide: Access to outpatient mental health care  Thank you for this consult request. Recommendations have been communicated to the primary team.  We will continue to follow, monitor response to lithium  and cognitive status at this time.   Zelda Sharps, NP        History of Present Illness  Relevant Aspects of [facility]   Patient Report:  Ernest Romero is a 58 y.o. male admitted: Medically. Patient was assessed on 07/05/24 by psychiatry and was unable to engage in a meaningful interview due to status post left craniectomy on 06/30/24. Consult was requested by attending due to low lithium  levels. Patient is currently sleeping with sitter at bedside. Recently  was given ativan  0.5 mg due to 'he got spicy and was fixated on the need to be discharged. Review of lithium  levels, he is historically at his current level of 0.39 Will continue to monitor behavior and mood and adjust according if needed. Also receiving phenobarbital as precaution following surgery.    07/09/2024: Today on assessment, patient is more participative in exam.  Patient was still noted to be sleepy during exam, but participated in most of exam until nodding off to sleep towards end of exam today.  Patient was alert to self but disoriented to situation and time.  Patient reported he was currently in [place]  and the year was 60.  He reported remembering being in jail, but did not remember what he did prior to going to jail or what occurred at jail for him to be admitted to hospital.  Patient did report feelings of irritability.  When asked about his current medication regimen, patient was unsure of what he was taking.  We informed him that currently he is on lithium  and Latuda .  Patient was okay with making adjustments to Latuda  today to attempt to target increased irritability.  He denied any suicidal or homicidal ideations.  He  denied any auditory or visual hallucinations as well.  Patient is still requiring assistance with ADLs and a one-to-one sitter remains at bedside.  Assessment findings and plan relayed to medical team.  Psych ROS:  Depression: documented hx Anxiety:  documented hx Mania (lifetime and current): documented hx Psychosis: (lifetime and current): unknown   Psychiatric and Social History  Psychiatric History:  Information collected from patient chart, nursing staff  Prev Dx/Sx: bipolar disorder Current Psych Provider: Dr. Daniel Home Meds (current): lithium , lurasidone  Previous Med Trials: unknown Therapy: unknown  Prior Psych Hospitalization: unknown  Prior Self Harm: unknown Prior Violence: unknown  Family Psych History: unknown Family Hx suicide:  unknown  Social History:   Educational Hx: unknown Occupational Hx: unknown Legal Hx: was incarcerated Living Situation: jail Spiritual Hx: unknown Access to weapons/lethal means: unknown   Substance History- Patient support worker reported Mr. Foust also stated patient had several dirty urines. Clarified as positive drug screens.  Alcohol: unknown  Type of alcohol unknown Last Drink unknown Number of drinks per day unknown History of alcohol withdrawal seizures unknown History of DT's unknown Tobacco: unknown Illicit drugs: unknown Prescription drug abuse: unknown Rehab hx: unknown  Exam Findings  Physical Exam: I have reviewed and agree with previous exam Vital Signs:  Temp:  [97.3 F (36.3 C)-97.6 F (36.4 C)] 97.3 F (36.3 C) (10/07 0839) Pulse Rate:  [64-71] 64 (10/07 0839) Resp:  [17-20] 17 (10/07 0839) BP: (129-148)/(89-98) 129/96 (10/07 0839) SpO2:  [92 %-100 %] 92 % (10/07 0839) Blood pressure (!) 129/96, pulse 64, temperature (!) 97.3 F (36.3 C), temperature source Oral, resp. rate 17, height 5' 10 (1.778 m), weight 67 kg, SpO2 92%. Body mass index is 21.19 kg/m.    Mental Status Exam: General Appearance: Lethargic  Orientation:  Only to self  Memory:  Poor  Concentration:  Poor  Recall:  Poor  Attention  Poor  Eye Contact:  Poor  Speech:  Slurred  Language:  Fair  Volume:  Decreased  Mood: alright  Affect:  Flat  Thought Process:  Patient appears confused  Thought Content:  Disorganized- thinking he is in [place]   Suicidal Thoughts:  Denied  Homicidal Thoughts:  Denied  Judgement:  Impaired  Insight:  UTA  Psychomotor Activity:  Slow  Akathisia:  Denied  Fund of Knowledge:  Poor at this current time      Assets:  UTA  Cognition:  Impaired  ADL's:  Impaired  AIMS (if indicated):        Other History   These have been pulled in through the EMR, reviewed, and updated if appropriate.  Family History:  The patient's family  history is not on file.  Medical History: Past Medical History:  Diagnosis Date   Anxiety    Bipolar 1 disorder (HCC)    Depression    Hepatitis    Hypertension     Surgical History: Past Surgical History:  Procedure Laterality Date   CRANIOTOMY Left 06/30/2024   Procedure: CRANIOTOMY HEMATOMA EVACUATION SUBDURAL;  Surgeon: Melonie Grass, MD;  Location: [facility] ORS;  Service: Neurosurgery;  Laterality: Left;     Medications:   Current Facility-Administered Medications:    (feeding supplement) PROSource Plus liquid 30 mL, 30 mL, Oral, BID BM, Ponnala, Shruthi, MD   acetaminophen  (TYLENOL ) suppository 650 mg, 650 mg, Rectal, Q6H PRN, Nelson, Dana G, NP, 650 mg at 06/30/24 1952   amLODipine  (NORVASC ) tablet 5 mg, 5 mg, Oral, Daily, Ponnala, Shruthi, MD, 5 mg at 07/08/24 [phone]  Chlorhexidine Gluconate Cloth 2 % PADS 6 each, 6 each, Topical, Daily, Bousman, Karlie, PA-C, 6 each at 07/08/24 0915   docusate (COLACE) 50 MG/5ML liquid 100 mg, 100 mg, Oral, BID PRN, Rust-Chester, Britton L, NP   enoxaparin  (LOVENOX ) injection 40 mg, 40 mg, Subcutaneous, Q24H, Wieting, Richard, MD, 40 mg at 07/08/24 2031   feeding supplement (ENSURE PLUS HIGH PROTEIN) liquid 237 mL, 237 mL, Oral, TID BM, Wieting, Richard, MD, 237 mL at 07/08/24 2036   folic acid  (FOLVITE ) tablet 1 mg, 1 mg, Oral, Daily, Rust-Chester, Britton L, NP, 1 mg at 07/08/24 9085   haloperidol  lactate (HALDOL ) injection 2 mg, 2 mg, Intravenous, Daily PRN, Ponnala, Shruthi, MD, 2 mg at 07/08/24 2356   lithium  carbonate (ESKALITH ) ER tablet 450 mg, 450 mg, Oral, QHS, Rust-Chester, Britton L, NP, 450 mg at 07/08/24 2117   LORazepam  (ATIVAN ) injection 0.5 mg, 0.5 mg, Intravenous, Q4H PRN, Ponnala, Shruthi, MD, 0.5 mg at 07/09/24 0849   lurasidone  (LATUDA ) tablet 20 mg, 20 mg, Oral, Q breakfast, Rust-Chester, Britton L, NP, 20 mg at 07/08/24 9085   multivitamin with minerals tablet 1 tablet, 1 tablet, Oral, Daily, Rust-Chester, Britton  L, NP, 1 tablet at 07/08/24 0914   Oral care mouth rinse, 15 mL, Mouth Rinse, PRN, Ponnala, Shruthi, MD   polyethylene glycol (MIRALAX / GLYCOLAX) packet 17 g, 17 g, Oral, Daily PRN, Clair Marolyn NOVAK, RPH, 17 g at 07/07/24 0751   thiamine  (VITAMIN B1) tablet 100 mg, 100 mg, Oral, Daily, Chappell, Alex B, RPH, 100 mg at 07/08/24 9085  Allergies: No Known Allergies  Zelda Sharps, NP

## 2024-07-09 NOTE — Progress Notes (Signed)
 Received notification that patient's peer support worker Idell Rams was at bedside. Upon entering room, patient noted to be attempting to exit bed and expressing frustration over being held in [place].  Patient able to be re-directed with extensive prompts. Mr. Rams unable to provide much information regarding patient, other than he has been working with him for 5 years under the agency [employer]. He states patient used to see Dr. Beverley, a psychiatrist and had mental meds but hasn't had them in quite awhile. Mr. Rams also stated patient had several dirty urines. Clarified as positive drug screens.  Mr. Rams states he thought patient was in a mental facility in [place]. Information conveyed to bedside team.

## 2024-07-09 NOTE — TOC Progression Note (Signed)
 Transition of Care [facility]) - Progression Note    Patient Details  Name: Ernest Romero MRN: [id number] Date of Birth: [birth year]  Transition of Care [facility]) CM/SW Contact  Dalia GORMAN Fuse, RN Phone Number: 07/09/2024, 10:18 AM  Clinical Narrative:    TOC lvmm with [facility] DSS worker Daphene Vicci [phone] requesting a callback.                     Expected Discharge Plan and Services                                               Social Drivers of Health (SDOH) Interventions SDOH Screenings   Food Insecurity: Patient Declined (07/02/2024)  Housing: Patient Unable To Answer (07/02/2024)  Transportation Needs: Patient Declined (07/02/2024)  Utilities: Patient Unable To Answer (07/02/2024)  Alcohol Screen: Low Risk  (08/30/2022)  Depression (PHQ2-9): Medium Risk (08/30/2022)  Tobacco Use: Medium Risk (06/30/2024)    Readmission Risk Interventions     No data to display

## 2024-07-09 NOTE — Progress Notes (Signed)
 Physical Therapy Treatment Patient Details Name: Ernest Romero MRN: [id number] DOB: [birth year] Today's Date: 07/09/2024   History of Present Illness Pt is a 58 year old male presenting from jail after found down,  found to have a large left sided SDH with shift s/p left craniectomy 06/30/24    PMH significant for PMH alcohol use disorder, psychiatric disorder    PT Comments  Pt in bed, agreeable to walk.  To EOB with min a x 2.  Soft helmet donned.  Upon standing he asks to use urinal and is assisted. He then continues to walk 80' with HHA x 2 for self selected distance in hallway.  Remains in chair to east after gait.  Pt is noted to take large mouth fulls of food on spoos with some difficulty grasping cups but does manage with min assist.  He remains up to eat with sitter in room.  Returned after eating with hopes of a second walk but he has already returned to bed.  Discussed with sitters in room.  Gait belt left for staff use.  Pt does well with gait with +2 hand held assist.  Recommended attempting mobility, even short distances, when he becomes agitated in hopes mobility will calm pt and assist with decreasing medication use.    If plan is discharge home, recommend the following: A lot of help with bathing/dressing/bathroom;Assistance with cooking/housework;Direct supervision/assist for financial management;Supervision due to cognitive status;Help with stairs or ramp for entrance;Direct supervision/assist for medications management;Assist for transportation;Two people to help with walking and/or transfers   Can travel by private vehicle        Equipment Recommendations       Recommendations for Other Services       Precautions / Restrictions Precautions Precautions: Fall Recall of Precautions/Restrictions: Impaired Precaution/Restrictions Comments: helmet, ok to mobilize with therapy prior to delivery per neurosurgical team Restrictions Weight Bearing Restrictions Per Provider  Order: No     Mobility  Bed Mobility Overal bed mobility: Needs Assistance       Supine to sit: Min assist       Patient Response: Cooperative  Transfers Overall transfer level: Needs assistance Equipment used: 2 person hand held assist Transfers: Sit to/from Stand Sit to Stand: Min assist, +2 physical assistance                Ambulation/Gait Ambulation/Gait assistance: Min assist, +2 physical assistance Gait Distance (Feet): 80 Feet Assistive device: 2 person hand held assist Gait Pattern/deviations: Decreased step length - right, Decreased dorsiflexion - right, Decreased step length - left, Decreased dorsiflexion - left, Decreased stride length Gait velocity: decreased     General Gait Details: is able to progress gait into hallway today.  self selects distance   Stairs             Wheelchair Mobility     Tilt Bed Tilt Bed Patient Response: Cooperative  Modified Rankin (Stroke Patients Only)       Balance Overall balance assessment: Needs assistance Sitting-balance support: Feet supported Sitting balance-Leahy Scale: Fair     Standing balance support: Bilateral upper extremity supported, During functional activity Standing balance-Leahy Scale: Poor Standing balance comment: +2 for safety                            Communication Communication Communication: Impaired Factors Affecting Communication: Reduced clarity of speech  Cognition Arousal: Alert Behavior During Therapy: Flat affect   PT - Cognitive impairments:  No family/caregiver present to determine baseline, Awareness, Memory, Attention, Initiation, Safety/Judgement, Problem solving, Sequencing                       PT - Cognition Comments: focused on finding his shoes but easily redirectable Following commands: Impaired Following commands impaired: Follows one step commands inconsistently, Follows one step commands with increased time    Cueing Cueing  Techniques: Verbal cues, Gestural cues, Tactile cues, Visual cues  Exercises Other Exercises Other Exercises: standing to void in urinal    General Comments        Pertinent Vitals/Pain Pain Assessment Pain Assessment: No/denies pain Pain Location: when asked he stated his head was ok. Pain Intervention(s): Monitored during session    Home Living                          Prior Function            PT Goals (current goals can now be found in the care plan section) Progress towards PT goals: Progressing toward goals    Frequency    Min 3X/week      PT Plan      Co-evaluation              AM-PAC PT 6 Clicks Mobility   Outcome Measure  Help needed turning from your back to your side while in a flat bed without using bedrails?: A Little Help needed moving from lying on your back to sitting on the side of a flat bed without using bedrails?: A Little Help needed moving to and from a bed to a chair (including a wheelchair)?: A Lot Help needed standing up from a chair using your arms (e.g., wheelchair or bedside chair)?: A Lot Help needed to walk in hospital room?: A Lot Help needed climbing 3-5 steps with a railing? : A Lot 6 Click Score: 14    End of Session Equipment Utilized During Treatment: Gait belt;Other (comment) (soft helmet) Activity Tolerance: Patient tolerated treatment well;Patient limited by fatigue Patient left: in chair;with call bell/phone within reach;with nursing/sitter in room Nurse Communication: Mobility status PT Visit Diagnosis: Other symptoms and signs involving the nervous system (R29.898);Difficulty in walking, not elsewhere classified (R26.2);Muscle weakness (generalized) (M62.81);Other abnormalities of gait and mobility (R26.89);Unsteadiness on feet (R26.81)     Time: 8854-8840 PT Time Calculation (min) (ACUTE ONLY): 14 min  Charges:    $Gait Training: 8-22 mins PT General Charges $$ ACUTE PT VISIT: 1 Visit                    Lauraine Gills, PTA 07/09/24, 1:34 PM

## 2024-07-09 NOTE — Progress Notes (Signed)
 Spoke with patient's Location manager. Officer Bridgette states all outside contacts they have for patient are deceased.  Officer Bridgette also stated address in [place], provided to DAC by peer support worker J. Jurline, is invalid.   Documentation of hospitalization provided to [employer].

## 2024-07-09 NOTE — Telephone Encounter (Signed)
 Patient is currently still in hospital. Mailed this information to his home address, will call once patient is discharged.

## 2024-07-10 DIAGNOSIS — S065XAA Traumatic subdural hemorrhage with loss of consciousness status unknown, initial encounter: Secondary | ICD-10-CM | POA: Diagnosis not present

## 2024-07-10 LAB — GLUCOSE, CAPILLARY
Glucose-Capillary: 99 mg/dL (ref 70–99)
Glucose-Capillary: 110 mg/dL — ABNORMAL HIGH (ref 70–99)
Glucose-Capillary: 103 mg/dL — ABNORMAL HIGH (ref 70–99)

## 2024-07-10 MED ORDER — LORAZEPAM 2 MG/ML IJ SOLN
1.0000 mg | INTRAMUSCULAR | Status: DC | PRN
  Administered 2024-07-10 – 2024-07-15 (×15): 1 mg via INTRAVENOUS
  Filled 2024-07-10 (×15): qty 1

## 2024-07-10 MED ORDER — HALOPERIDOL LACTATE 5 MG/ML IJ SOLN
2.0000 mg | Freq: Four times a day (QID) | INTRAMUSCULAR | Status: DC | PRN
  Administered 2024-07-10 – 2024-07-13 (×7): 2 mg via INTRAVENOUS
  Filled 2024-07-10 (×7): qty 1

## 2024-07-10 NOTE — Progress Notes (Addendum)
 PROGRESS NOTE    Ernest Romero  FMW:[id number] DOB: 12-Feb-1966 DOA: 06/30/2024 PCP: [name]  108A/108A-AA  LOS: 10 days   Brief hospital course:   Assessment & Plan: 58 y.o. male who presented to [facility] ED for evaluation of a subdural hematoma. Patient presented from local jail for the evaluation of unresponsiveness. Previous Head CT has shown small subdural hematomas over the past few weeks  According to the jail staff, the patient had been incarcerated for approximately 12 hours prior to this acute change in mental status. There were no signs to suggest trauma, injuries or falls.  Due to AMS, concern for airway protection and respiratory failure, pt was intubated in ER. CT with left SDH with significant midline shift, s/p left fronto parietal craniotomy by Neurosurgery. Hospital course as below   Subdural hematoma (HCC) - Status post left frontal parietal craniotomy with evacuation of subdural hematoma and placement of a drain.  Drain removed on 9/29.   - Neurosurgical team placed an order for helmet.   --discussed with neurosurgery today, no need for seizure ppx --PT/OT - Repeat CT in 6 weeks as outpatient, Neurosurgery follow up   Alcohol withdrawal (HCC) - S/p phenobarbital taper --cont thiamine  and folic acid    Acute Encephalopathy - Improving - Suspect behavioral/agitation likely due to acute alcohol withdrawal - May also have some changes due to recent craniotomy, underlying bipolar disorder - SLP following for cognitive/lang eval - Has 1:1 sitter, wean as able --IV haldol  and ativan  PRN   Malnutrition of moderate degree --supplements per dietician   Bipolar disorder - Seen by Psychiatry, appreciate recs - Reportedly follows up with psychiatry, Dr.Su [phone]) --cont Lithium  and Latuda    Essential hypertension --cont amlodipine    Hypophosphatemia - Repleted   Social determinants - Due to acute changes patient does not have  decision-making capacity at this time, is AO x 2, Patient's support counselor [name] was at the bedside - [phone] - states does not have kids, his spouse passed away. Has sister, estranged. Will need guardianship - TOC aware    DVT prophylaxis: Lovenox  SQ Code Status: Full code  Family Communication:  Level of care: Telemetry Medical Dispo:   The patient is from: homeless Anticipated d/c is to: [facility] Anticipated d/c date is: whenever [facility] has a bed for pt   Subjective and Interval History:  When pt was awake, he tried to walk out, didn't want help, and said he wanted to leave, all the while swaying and being very unsteady.  TOC and leadership have decided to arrange transfer for pt to go to [facility], where there is better holding unit for pt at risk for brain trauma from falling.     Objective: Vitals:   07/10/24 0517 07/10/24 0845 07/10/24 1200 07/10/24 1709  BP: 110/69 117/87 (!) 129/91 (!) 138/108  Pulse: 74 63 68 76  Resp:  16 15   Temp: 98.3 F (36.8 C) 97.6 F (36.4 C)  98 F (36.7 C)  TempSrc:  Axillary    SpO2: 99% 100% 99% 100%  Weight:      Height:        Intake/Output Summary (Last 24 hours) at 07/10/2024 2020 Last data filed at 07/10/2024 1700 Gross per 24 hour  Intake 120 ml  Output 900 ml  Net -780 ml   Filed Weights   07/07/24 0435 07/08/24 0500 07/10/24 0348  Weight: 68 kg 67 kg 70.6 kg    Examination:   Constitutional: NAD, alert,  no insight HEENT: conjunctivae and lids normal, EOMI CV: No cyanosis.   RESP: normal respiratory effort, on RA Neuro: II - XII grossly intact.     Data Reviewed: I have personally reviewed labs and imaging studies  Time spent: 35 minutes  Ellouise Haber, MD [facility] If 7PM-7AM, please contact night-coverage 07/10/2024, 8:20 PM

## 2024-07-10 NOTE — TOC PASRR Note (Addendum)
 The above named patient is recommended to go to Short Term Rehab for strengthening and gait training for balance.  It is expected that the Short Term Rehab stay will be less than 30 days.  The patient is expected to return home after Rehab.

## 2024-07-10 NOTE — NC FL2 (Signed)
 [place]  MEDICAID FL2 LEVEL OF CARE FORM     IDENTIFICATION  Patient Name: Ernest Romero Birthdate: 1966-05-01 Sex: male Admission Date (Current Location): 06/30/2024  [facility] and [place] Number:  Chiropodist and Address:  [facility], [address] [place], [place] [address]      Provider Number: [id number]  Attending Physician Name and Address:  Awanda City, MD  Relative Name and Phone Number:  Jurline Amble Pricilla)  [phone]    Current Level of Care: Hospital Recommended Level of Care: Skilled Nursing Facility Prior Approval Number:    Date Approved/Denied:   PASRR Number:    Discharge Plan: SNF    Current Diagnoses: Patient Active Problem List   Diagnosis Date Noted   Malnutrition of moderate degree 07/02/2024   Alcohol withdrawal (HCC) 07/02/2024   Hypophosphatemia 07/02/2024   Subdural hematoma (HCC) 06/30/2024   Behavior concern in adult 04/21/2024   Drug-seeking behavior 03/02/2024   Polysubstance abuse (HCC) 03/02/2024   Delusions (HCC) 04/15/2023   Cocaine abuse with cocaine-induced psychotic disorder, with delusions (HCC) 04/15/2023   Overweight with body mass index (BMI) of 28 to 28.9 in adult 08/30/2022   Essential hypertension 09/17/2018   Bipolar disorder (HCC) 04/22/2013    Orientation RESPIRATION BLADDER Height & Weight     Self    Incontinent Weight: 70.6 kg Height:  5' 10 (177.8 cm)  BEHAVIORAL SYMPTOMS/MOOD NEUROLOGICAL BOWEL NUTRITION STATUS      Incontinent Diet  AMBULATORY STATUS COMMUNICATION OF NEEDS Skin     Verbally                         Personal Care Assistance Level of Assistance  Bathing, Feeding, Dressing Bathing Assistance: Limited assistance Feeding assistance: Independent Dressing Assistance: Limited assistance     Functional Limitations Info             SPECIAL CARE FACTORS FREQUENCY  PT (By licensed PT), OT (By licensed OT)     PT Frequency: 5 x week OT Frequency:  5 x week            Contractures      Additional Factors Info  Code Status, Allergies Code Status Info: FULL Allergies Info: NKA           Current Medications (07/10/2024):  This is the current hospital active medication list Current Facility-Administered Medications  Medication Dose Route Frequency Provider Last Rate Last Admin   (feeding supplement) PROSource Plus liquid 30 mL  30 mL Oral BID BM Ponnala, Shruthi, MD   30 mL at 07/10/24 1559   acetaminophen  (TYLENOL ) suppository 650 mg  650 mg Rectal Q6H PRN Nelson, Dana G, NP   650 mg at 06/30/24 1952   amLODipine  (NORVASC ) tablet 5 mg  5 mg Oral Daily Ponnala, Shruthi, MD   5 mg at 07/10/24 0949   Chlorhexidine Gluconate Cloth 2 % PADS 6 each  6 each Topical Daily Bousman, Karlie, PA-C   6 each at 07/10/24 1000   docusate (COLACE) 50 MG/5ML liquid 100 mg  100 mg Oral BID PRN Rust-Chester, Britton L, NP       enoxaparin  (LOVENOX ) injection 40 mg  40 mg Subcutaneous Q24H Wieting, Richard, MD   40 mg at 07/09/24 2020   feeding supplement (ENSURE PLUS HIGH PROTEIN) liquid 237 mL  237 mL Oral TID BM Josette Ade, MD   237 mL at 07/10/24 1456   folic acid  (FOLVITE ) tablet 1 mg  1 mg Oral Daily Rust-Chester, Britton L, NP   1 mg at 07/10/24 9050   haloperidol  lactate (HALDOL ) injection 2 mg  2 mg Intravenous Q6H PRN Awanda City, MD       lithium  carbonate (ESKALITH ) ER tablet 450 mg  450 mg Oral QHS Rust-Chester, Britton L, NP   450 mg at 07/09/24 2021   LORazepam  (ATIVAN ) injection 1 mg  1 mg Intravenous Q4H PRN Duncan, Hazel V, MD   1 mg at 07/10/24 9050   lurasidone  (LATUDA ) tablet 40 mg  40 mg Oral Q breakfast Smith, Annie B, NP   40 mg at 07/10/24 0741   multivitamin with minerals tablet 1 tablet  1 tablet Oral Daily Rust-Chester, Jenita CROME, NP   1 tablet at 07/10/24 0949   nicotine (NICODERM CQ - dosed in mg/24 hours) patch 21 mg  21 mg Transdermal Daily Ponnala, Shruthi, MD   21 mg at 07/10/24 1000   Oral care mouth rinse  15  mL Mouth Rinse PRN Ponnala, Shruthi, MD       polyethylene glycol (MIRALAX / GLYCOLAX) packet 17 g  17 g Oral Daily PRN Chappell, Alex B, RPH   17 g at 07/07/24 0751   thiamine  (VITAMIN B1) tablet 100 mg  100 mg Oral Daily Chappell, Alex B, RPH   100 mg at 07/10/24 9050     Discharge Medications: Please see discharge summary for a list of discharge medications.  Relevant Imaging Results:  Relevant Lab Results:   Additional Information SSN [id number]  Dalia GORMAN Fuse, RN

## 2024-07-10 NOTE — Plan of Care (Signed)

## 2024-07-10 NOTE — Progress Notes (Signed)
 Occupational Therapy Treatment Patient Details Name: JAIVYN GULLA MRN: [id number] DOB: 11/15/1965 Today's Date: 07/10/2024   History of present illness Pt is a 58 year old male presenting from jail after found down,  found to have a large left sided SDH with shift s/p left craniectomy 06/30/24    PMH significant for PMH alcohol use disorder, psychiatric disorder   OT comments  Upon entering the room, pt supine in bed with NT present in room. Pt initially very restless in bed and attempting to remove all of clothing. He does tell staff he needs to urinate but at that very moment begins to urinate in bed. Urinal held for him. Pt is able to be redirected and dons gown once told that we are going to exit bed so linens can be changed and he has to cover up. Pt also does not get upset this session when helmet is donned. Pt stands with min A and ambulates outside into hallway. While ambulating, therapist orients him to time and next meal. Pt  dual tasking by verbalizing multiple meals he likes to eat for lunch including toppings he prefers on these meals while ambulating with min HHA 150'. Pt needing minimal redirection in hallway. Pt returns to room and dons scrub top and bottoms with mod A. RN arrived to give medication and pt takes meds while seated on EOB and then returns to supine. Pt thinking staff for taking care of him. Pt making progress this session and is an emerging Rancho level V.       If plan is discharge home, recommend the following:  A lot of help with bathing/dressing/bathroom;A lot of help with walking and/or transfers;Supervision due to cognitive status   Equipment Recommendations  Other (comment) (defer to next venue of care)       Precautions / Restrictions Precautions Precautions: Fall Precaution/Restrictions Comments: helmet with mobility       Mobility Bed Mobility Overal bed mobility: Needs Assistance Bed Mobility: Rolling Rolling: Supervision   Supine to sit:  Contact guard Sit to supine: Contact guard assist        Transfers Overall transfer level: Needs assistance Equipment used: 1 person hand held assist Transfers: Sit to/from Stand Sit to Stand: Min assist                 Balance Overall balance assessment: Needs assistance Sitting-balance support: Feet supported Sitting balance-Leahy Scale: Fair     Standing balance support: During functional activity, Single extremity supported Standing balance-Leahy Scale: Fair                             ADL either performed or assessed with clinical judgement   ADL Overall ADL's : Needs assistance/impaired                                            Extremity/Trunk Assessment                       Communication Communication Communication: Impaired Factors Affecting Communication: Reduced clarity of speech   Cognition Arousal: Alert Behavior During Therapy: Restless Cognition: No family/caregiver present to determine baseline, Cognition impaired, [place]o level   Orientation impairments: Place, Time, Situation Awareness: Intellectual awareness impaired, Online awareness impaired     Executive functioning impairment (select all impairments): Organization, Sequencing, Reasoning,  Problem solving, Initiation                 [email or web address] Scales of Cognitive Functioning: Confused, Inappropriate Non-Agitated: Maximal Assistance [V] Following commands: Impaired Following commands impaired: Follows one step commands inconsistently, Follows one step commands with increased time      Cueing   Cueing Techniques: Verbal cues, Gestural cues, Tactile cues, Visual cues             Pertinent Vitals/ Pain       Pain Assessment Pain Assessment: Faces Faces Pain Scale: No hurt         Frequency  Min 3X/week        Progress Toward Goals  OT Goals(current goals can now be found in the care plan section)  Progress towards OT  goals: Progressing toward goals      AM-PAC OT 6 Clicks Daily Activity     Outcome Measure   Help from another person eating meals?: A Lot Help from another person taking care of personal grooming?: A Lot Help from another person toileting, which includes using toliet, bedpan, or urinal?: A Lot Help from another person bathing (including washing, rinsing, drying)?: A Lot Help from another person to put on and taking off regular upper body clothing?: A Lot Help from another person to put on and taking off regular lower body clothing?: A Lot 6 Click Score: 12    End of Session    OT Visit Diagnosis: Other abnormalities of gait and mobility (R26.89);Muscle weakness (generalized) (M62.81);Other symptoms and signs involving the nervous system (R29.898);Cognitive communication deficit (R41.841);Other symptoms and signs involving cognitive function   Activity Tolerance Patient tolerated treatment well   Patient Left in bed;with call bell/phone within reach;with bed alarm set;with nursing/sitter in room   Nurse Communication Mobility status        Time: 8857-8784 OT Time Calculation (min): 33 min  Charges: OT General Charges $OT Visit: 1 Visit OT Treatments $Self Care/Home Management : 8-22 mins $Therapeutic Activity: 8-22 mins  Izetta Claude, MS, OTR/L , CBIS ascom [phone]  07/10/24, 12:47 PM

## 2024-07-10 NOTE — TOC Progression Note (Signed)
 Transition of Care [facility]) - Progression Note    Patient Details  Name: Ernest Romero MRN: [id number] Date of Birth: Dec 11, 1965  [facility] CM/SW Contact  Dalia GORMAN Fuse, RN Phone Number: 07/10/2024, 10:45 AM  Clinical Narrative:     TOC lvmm with [facility] DSS worker Daphene Vicci [phone] requesting a callback.                     Expected Discharge Plan and Services                                               Social Drivers of Health (SDOH) Interventions SDOH Screenings   Food Insecurity: Patient Declined (07/02/2024)  Housing: Patient Unable To Answer (07/02/2024)  Transportation Needs: Patient Declined (07/02/2024)  Utilities: Patient Unable To Answer (07/02/2024)  Alcohol Screen: Low Risk  (08/30/2022)  Depression (PHQ2-9): Medium Risk (08/30/2022)  Tobacco Use: Medium Risk (06/30/2024)    Readmission Risk Interventions     No data to display

## 2024-07-11 DIAGNOSIS — S065XAA Traumatic subdural hemorrhage with loss of consciousness status unknown, initial encounter: Secondary | ICD-10-CM | POA: Diagnosis not present

## 2024-07-11 LAB — GLUCOSE, CAPILLARY
Glucose-Capillary: 129 mg/dL — ABNORMAL HIGH (ref 70–99)
Glucose-Capillary: 108 mg/dL — ABNORMAL HIGH (ref 70–99)

## 2024-07-11 NOTE — Progress Notes (Signed)
 Physical Therapy Treatment Patient Details Name: Ernest Romero MRN: [id number] DOB: 1966/05/24 Today's Date: 07/11/2024   History of Present Illness Pt is a 58 year old male presenting from jail after found down,  found to have a large left sided SDH with shift s/p left craniectomy 06/30/24    PMH significant for PMH alcohol use disorder, psychiatric disorder    PT Comments  Pt was sitting in standard chair with armrest, eating lunch upon arrival. He greets Chartered loss adjuster and remains conversational throughout. Does have cognition deficits but did consistently follow simple commands. Pt endorses wanting to go outside to smoke several times but was easily redirected to desired task. Cranial helmet place at beginning of session and remained on throughout all standing activity. Pt was able to ambulate to BR for successful BM prior to standing and ambulating 200 ft with HHA +1. Pt started gait training with poor step quality. With vcs was able to correct however unable to maintain. Pt is at high risk of falls due to balance deficits, awareness, and cognition concerns. After ambulation, pt endorses severe fatigue and request to return to bed. No physical assistance required to progress into bed. Cranial helmet removed with sitter present at bedside at conclusion of session. Pt remains far from baseline abilities and will continue to benefit from skilled PT to maximize independence and safety with all ADLs.    If plan is discharge home, recommend the following: A lot of help with bathing/dressing/bathroom;Assistance with cooking/housework;Direct supervision/assist for financial management;Supervision due to cognitive status;Help with stairs or ramp for entrance;Direct supervision/assist for medications management;Assist for transportation;Two people to help with walking and/or transfers     Equipment Recommendations  Other (comment) (Defer to next level of care)       Precautions / Restrictions  Precautions Precautions: Fall Recall of Precautions/Restrictions: Impaired Precaution/Restrictions Comments: helmet with mobility Restrictions Weight Bearing Restrictions Per Provider Order: No     Mobility  Bed Mobility Overal bed mobility: Needs Assistance Bed Mobility: Sit to Supine  Sit to supine: Supervision General bed mobility comments: no physical assistance to get into bed.    Transfers Overall transfer level: Needs assistance Equipment used: 1 person hand held assist, None Transfers: Sit to/from Stand Sit to Stand: Contact guard assist, Min assist  General transfer comment: CGA-min HHA +1 to stand form standard chair with armrest and from toilet. Pt did stand from toilet two seperate times and 2 x from standard chair    Ambulation/Gait Ambulation/Gait assistance: Min assist Gait Distance (Feet): 200 Feet Assistive device: 1 person hand held assist Gait Pattern/deviations: Step-through pattern, Decreased stride length, Decreased step length - right, Decreased step length - left, Narrow base of support Gait velocity: decreased  General Gait Details: Pt started ambulation with narrow BOS + short step length however progress to improved step through gait quality. Cranial helmet in place throughout session    Balance Overall balance assessment: Needs assistance Sitting-balance support: Feet supported Sitting balance-Leahy Scale: Good     Standing balance support: Single extremity supported, During functional activity Standing balance-Leahy Scale: Fair Standing balance comment: Pt is at risk of falls even with +1 UE support. Does not use AD at baseline.       Communication Communication Communication: No apparent difficulties  Cognition Arousal: Alert Behavior During Therapy: WFL for tasks assessed/performed   PT - Cognitive impairments: Safety/Judgement, Problem solving    PT - Cognition Comments: Pt did consistently follow simple commands however lacks insight  of his deficits and has  poor recall of past sessions/events. He does remain cooperative abut voices desires to go outside for smoking. Following commands: Intact Following commands impaired: Follows one step commands inconsistently, Follows one step commands with increased time    Cueing Cueing Techniques: Verbal cues, Gestural cues, Tactile cues, Visual cues     General Comments General comments (skin integrity, edema, etc.): Pt did have large successful BM during session and was able to perform self hygiene /wiping himself afterwards      Pertinent Vitals/Pain Pain Assessment Pain Assessment: No/denies pain Pain Score: 0-No pain Pain Location:  I'm just tired. Pain Descriptors / Indicators: Aching Pain Intervention(s): Limited activity within patient's tolerance, Monitored during session, Premedicated before session, Repositioned     PT Goals (current goals can now be found in the care plan section) Acute Rehab PT Goals Patient Stated Goal: go outside to smoke Progress towards PT goals: Progressing toward goals    Frequency    Min 3X/week           Co-evaluation     PT goals addressed during session: Mobility/safety with mobility;Balance;Proper use of DME;Strengthening/ROM        AM-PAC PT 6 Clicks Mobility   Outcome Measure  Help needed turning from your back to your side while in a flat bed without using bedrails?: A Little Help needed moving from lying on your back to sitting on the side of a flat bed without using bedrails?: A Little Help needed moving to and from a bed to a chair (including a wheelchair)?: A Little Help needed standing up from a chair using your arms (e.g., wheelchair or bedside chair)?: A Little Help needed to walk in hospital room?: A Little Help needed climbing 3-5 steps with a railing? : A Little 6 Click Score: 18    End of Session Equipment Utilized During Treatment: Gait belt (Cranial helmet) Activity Tolerance: Patient  tolerated treatment well;Patient limited by fatigue Patient left: in bed;with call bell/phone within reach;with bed alarm set;with nursing/sitter in room Nurse Communication: Mobility status PT Visit Diagnosis: Other symptoms and signs involving the nervous system (R29.898);Difficulty in walking, not elsewhere classified (R26.2);Muscle weakness (generalized) (M62.81);Other abnormalities of gait and mobility (R26.89);Unsteadiness on feet (R26.81)     Time: 8489-8471 PT Time Calculation (min) (ACUTE ONLY): 18 min  Charges:    $Gait Training: 8-22 mins PT General Charges $$ ACUTE PT VISIT: 1 Visit                     Rankin Essex PTA 07/11/24, 4:32 PM

## 2024-07-11 NOTE — Plan of Care (Signed)
  Problem: Health Behavior/Discharge Planning: Goal: Ability to manage health-related needs will improve Outcome: Progressing   Problem: Clinical Measurements: Goal: Will remain free from infection Outcome: Progressing   Problem: Activity: Goal: Risk for activity intolerance will decrease Outcome: Progressing   Problem: Elimination: Goal: Will not experience complications related to bowel motility Outcome: Progressing   

## 2024-07-11 NOTE — Plan of Care (Signed)

## 2024-07-11 NOTE — Progress Notes (Signed)
  PROGRESS NOTE    Ernest Romero  FMW:[id number] DOB: [birth year] DOA: 06/30/2024 PCP: [name]  108A/108A-AA  LOS: 11 days   Brief hospital course:   Assessment & Plan: 58 y.o. male who presented to [facility] ED for evaluation of a subdural hematoma. Patient [facility] from local jail for the evaluation of unresponsiveness. Previous Head CT has shown small subdural hematomas over the past few weeks  According to the jail staff, the patient had been incarcerated for approximately 12 hours prior to this acute change in mental status. There were no signs to suggest trauma, injuries or falls.  Due to AMS, concern for airway protection and respiratory failure, pt was intubated in ER. CT with left SDH with significant midline shift, s/p left fronto parietal craniotomy by Neurosurgery. Hospital course as below   Subdural hematoma (HCC) - Status post left frontal parietal craniotomy with evacuation of subdural hematoma and placement of a drain.  Drain removed on 9/29.   - Neurosurgical team placed an order for helmet.   --discussed with neurosurgery today, no need for seizure ppx --PT/OT - Repeat CT in 6 weeks as outpatient, Neurosurgery follow up   Alcohol withdrawal (HCC) - S/p phenobarbital taper --cont thiamine  and folic acid    Acute Encephalopathy - Improving - Suspect behavioral/agitation likely due to acute alcohol withdrawal - May also have some changes due to recent craniotomy, underlying bipolar disorder - SLP following for cognitive/lang eval - Has 1:1 sitter, wean as able --IV haldol  and ativan  PRN   Malnutrition of moderate degree --supplements per dietician   Bipolar disorder - Seen by Psychiatry, appreciate recs - Reportedly follows up with psychiatry, Dr.Su [phone]) --cont Lithium  and Latuda    Essential hypertension --cont amlodipine    Hypophosphatemia - Repleted   Social determinants - Due to acute changes patient does not have  decision-making capacity at this time, is AO x 2, Patient's support counselor Ernest Romero was at the bedside - [phone] - states does not have kids, his spouse passed away. Has sister, estranged. Will need guardianship - TOC aware    DVT prophylaxis: Lovenox  SQ Code Status: Full code  Family Communication:  Level of care: Telemetry Medical Dispo:   The patient is from: homeless Anticipated d/c is to: [facility] Anticipated d/c date is: whenever [facility] has a bed for pt   Subjective and Interval History:  Continued to want to leave when awake.  Sitter in the room.   Objective: Vitals:   07/11/24 0500 07/11/24 0715 07/11/24 1536 07/11/24 1908  BP:  (!) 134/95 134/85 (!) 139/102  Pulse:  64 74 99  Resp:  18 18 18   Temp:  98.3 F (36.8 C) 97.9 F (36.6 C) (!) 97.5 F (36.4 C)  TempSrc:   Oral Oral  SpO2:  97% 98% 99%  Weight: 68.8 kg     Height:        Intake/Output Summary (Last 24 hours) at 07/11/2024 1924 Last data filed at 07/11/2024 1325 Gross per 24 hour  Intake 400 ml  Output 1350 ml  Net -950 ml   Filed Weights   07/08/24 0500 07/10/24 0348 07/11/24 0500  Weight: 67 kg 70.6 kg 68.8 kg    Examination:   Constitutional: NAD CV: No cyanosis.   RESP: normal respiratory effort, on RA   Data Reviewed: I have personally reviewed labs and imaging studies  Time spent: 25 minutes  Ellouise Haber, MD [facility] If 7PM-7AM, please contact night-coverage 07/11/2024, 7:24 PM

## 2024-07-11 NOTE — Progress Notes (Signed)
   Neurosurgery Progress Note  History: Ernest Romero is s/p left craniectomy for SDH  POD11: Pt doing well on the floor. Intermittently agitated and eager to walk.  POD7: NAEO. Pt was transferred to floor  POD4: Pt remains intermittently agitated  POD3: Concerns overnight for EtOH withdrawal.  POD2:  Pt extubated yesterday. Pt complaining about mitts this morning. Denying headache  POD1: Pt reported to be following commands when off sedation.   Physical Exam: Vitals:   07/11/24 0254 07/11/24 0715  BP: (!) 145/99 (!) 134/95  Pulse: 74 64  Resp: 16 18  Temp: 98 F (36.7 C) 98.3 F (36.8 C)  SpO2: 97% 97%   PERRL   Patient oriented to self, year. 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 MAEW.  Incision well-appearing.  Closed with staples, no dressing present.  Crani site is soft.  Data:  Other tests/results:  CT head 9/28  FINDINGS:   BRAIN AND VENTRICLES: Left frontoparietal craniotomy was performed. The subdural hematoma was evacuated. A drain is in place. Minimal residual blood products remain in the extra-axial space. Pneumocephalus is present. Midline shift is improved now measuring 9 mm. No new hemorrhage is present. No evidence of acute infarct. No hydrocephalus.   ORBITS: No acute abnormality.   SINUSES: No acute abnormality.   SOFT TISSUES AND SKULL: Left frontoparietal craniotomy was performed. A drain is in place. No acute soft tissue abnormality. No skull fracture.   IMPRESSION: 1. Status post left frontoparietal craniotomy with evacuation of subdural hematoma and placement of a drain. 2. Minimal residual blood products in the extra-axial space and pneumocephalus. 3. Improved midline shift, now measuring 9 mm. 4. No new hemorrhage.  Assessment/Plan:  Ernest Romero is a 58 y.o presenting with AMS found to have a large left sided SDH with shift s/p left craniectomy.  - mental status improved, but still not oriented to  place.  - Drain removed 9/29.  - will continue to monitor exam - Staples can be removed on POD14 - remainder of care per CC team - Continue to wear helmet when out of bed and working with PT/OT.  Lyle Decamp PA-C Department of Neurosurgery

## 2024-07-11 NOTE — TOC Progression Note (Signed)
 Transition of Care [facility]) - Progression Note    Patient Details  Name: RULON ABDALLA MRN: [id number] Date of Birth: [birth year]  Transition of Care [facility]) CM/SW Contact  Dalia GORMAN Fuse, RN Phone Number: 07/11/2024, 9:34 AM  Clinical Narrative:      Per MD note the plan is to transition this patient to [facility], pending accepting provider and bed availability. FL2 completed and sent out. PASRR is pending additional review. The signed FL2 and 30 day note, H&P, Progress Notes, and Psych Notes were uploaded to [facility].   TOC will continue to follow.                   Expected Discharge Plan and Services                                               Social Drivers of Health (SDOH) Interventions SDOH Screenings   Food Insecurity: Patient Declined (07/02/2024)  Housing: Patient Unable To Answer (07/02/2024)  Transportation Needs: Patient Declined (07/02/2024)  Utilities: Patient Unable To Answer (07/02/2024)  Alcohol Screen: Low Risk  (08/30/2022)  Depression (PHQ2-9): Medium Risk (08/30/2022)  Tobacco Use: Medium Risk (06/30/2024)    Readmission Risk Interventions     No data to display

## 2024-07-11 NOTE — Progress Notes (Signed)
 SLP Cancellation Note  Patient Details Name: Ernest Romero MRN: [id number] DOB: May 20, 1966   Cancelled treatment:       Reason Eval/Treat Not Completed: Other (comment) SLP attempting treatment session x2 this date, initially pt resting. With second attempt at intervention, pt up in room with multiple staff members present to redirect/aid pt safety. SLP holding intervention at this time. Plan to follow up as pt is appropriate and schedule allows.   [place] Casha Estupinan Clapp, MS, CCC-SLP Speech Language Pathologist Rehab Services; [facility] [phone] (ascom)     [name] 07/11/2024, 11:43 AM

## 2024-07-12 DIAGNOSIS — S065XAA Traumatic subdural hemorrhage with loss of consciousness status unknown, initial encounter: Secondary | ICD-10-CM | POA: Diagnosis not present

## 2024-07-12 LAB — GLUCOSE, CAPILLARY
Glucose-Capillary: 103 mg/dL — ABNORMAL HIGH (ref 70–99)
Glucose-Capillary: 108 mg/dL — ABNORMAL HIGH (ref 70–99)

## 2024-07-12 NOTE — Plan of Care (Signed)
  Problem: Clinical Measurements: Goal: Ability to maintain clinical measurements within normal limits will improve Outcome: Progressing Goal: Cardiovascular complication will be avoided Outcome: Progressing   Problem: Coping: Goal: Level of anxiety will decrease Outcome: Progressing   Problem: Elimination: Goal: Will not experience complications related to urinary retention Outcome: Progressing

## 2024-07-12 NOTE — Progress Notes (Signed)
 Occupational Therapy Treatment Patient Details Name: Ernest Romero MRN: [id number] DOB: 10-21-1965 Today's Date: 07/12/2024   History of present illness Pt is a 58 year old male presenting from jail after found down,  found to have a large left sided SDH with shift s/p left craniectomy 06/30/24    PMH significant for PMH alcohol use disorder, psychiatric disorder   OT comments  Upon entering the room, pt supine in bed sleeping soundly with sitter present in room who reports pt has been sleeping most of the morning. Pt awakens easily and is agreeable to exiting out of the bed this session. Once therapist turns on lights, pt begins to get more restless and refusing to exit bed and attempts to pull covers back over his body. Pt is eventually agreeable to don helmet and get out of room. Pt perseverating on finding someone to come and pick him up so he can leave. OT attempting to redirect with max multimodal cues needed. Pt only ambulating ~ 32' and refusing to go further and wanting to return to room with assistance to locate. Pt seated on EOB and needing assistance to remove helmet and then jumps up to use bathroom. Pt ambulating with min A to stand to urinate into commode. Pt returning to bed and supine with min guard for safety and returning to side lying position. He continues to perseverate on leaving. OT turns on his favorite music for him to listen to and he begins to calm some and asks for grape juice. Juice provided and all needs within reach. Sitter remains in room as therapist exits.       If plan is discharge home, recommend the following:  A lot of help with bathing/dressing/bathroom;A lot of help with walking and/or transfers;Supervision due to cognitive status   Equipment Recommendations  Other (comment) (defer to next venue of care)       Precautions / Restrictions Precautions Precautions: Fall Recall of Precautions/Restrictions: Impaired Precaution/Restrictions Comments: helmet  with mobility Restrictions Weight Bearing Restrictions Per Provider Order: No       Mobility Bed Mobility Overal bed mobility: Needs Assistance Bed Mobility: Supine to Sit, Sit to Supine     Supine to sit: Min assist Sit to supine: Contact guard assist        Transfers Overall transfer level: Needs assistance Equipment used: 1 person hand held assist Transfers: Sit to/from Stand Sit to Stand: Contact guard assist                 Balance Overall balance assessment: Needs assistance Sitting-balance support: Feet supported Sitting balance-Leahy Scale: Good     Standing balance support: Single extremity supported, During functional activity Standing balance-Leahy Scale: Fair                             ADL either performed or assessed with clinical judgement   ADL Overall ADL's : Needs assistance/impaired                             Toileting- Clothing Manipulation and Hygiene: Minimal assistance                        Communication Communication Communication: No apparent difficulties   Cognition Arousal: Alert Behavior During Therapy: Restless Cognition: No family/caregiver present to determine baseline, Cognition impaired, Rancho level   Orientation impairments: Time, Situation, Place Awareness: Intellectual  awareness impaired, Online awareness impaired Memory impairment (select all impairments): Short-term memory, Declarative long-term memory, Non-declarative long-term memory Attention impairment (select first level of impairment): Focused attention Executive functioning impairment (select all impairments): Organization, Sequencing, Reasoning, Problem solving                 Rancho Mirant Scales of Cognitive Functioning: Confused/Agitated: Maximal Assistance [IV] Following commands: Intact Following commands impaired: Follows one step commands inconsistently, Follows one step commands with increased time       Cueing   Cueing Techniques: Verbal cues, Gestural cues, Tactile cues, Visual cues             Pertinent Vitals/ Pain       Pain Assessment Pain Assessment: No/denies pain         Frequency  Min 3X/week        Progress Toward Goals  OT Goals(current goals can now be found in the care plan section)  Progress towards OT goals: Progressing toward goals      AM-PAC OT 6 Clicks Daily Activity     Outcome Measure   Help from another person eating meals?: A Lot Help from another person taking care of personal grooming?: A Lot Help from another person toileting, which includes using toliet, bedpan, or urinal?: A Lot Help from another person bathing (including washing, rinsing, drying)?: A Lot Help from another person to put on and taking off regular upper body clothing?: A Lot Help from another person to put on and taking off regular lower body clothing?: A Lot 6 Click Score: 12    End of Session    OT Visit Diagnosis: Other abnormalities of gait and mobility (R26.89);Muscle weakness (generalized) (M62.81);Other symptoms and signs involving the nervous system (R29.898);Cognitive communication deficit (R41.841);Other symptoms and signs involving cognitive function   Activity Tolerance Patient tolerated treatment well   Patient Left in bed;with call bell/phone within reach;with bed alarm set;with nursing/sitter in room   Nurse Communication Mobility status        Time: 8943-8868 OT Time Calculation (min): 35 min  Charges: OT General Charges $OT Visit: 1 Visit OT Treatments $Self Care/Home Management : 8-22 mins $Therapeutic Activity: 8-22 mins  Izetta Claude, MS, OTR/L , CBIS ascom [phone]  07/12/24, 1:56 PM

## 2024-07-12 NOTE — Progress Notes (Signed)
 Physical Therapy Treatment Patient Details Name: Ernest Romero MRN: [id number] DOB: Mar 03, 1966 Today's Date: 07/12/2024   History of Present Illness Pt is a 58 year old male presenting from jail after found down,  found to have a large left sided SDH with shift s/p left craniectomy 06/30/24    PMH significant for PMH alcohol use disorder, psychiatric disorder    PT Comments  Pt was long sitting in bed looking a book upon arrival. He is alert but more lethargic overall in presentation versus previous date. He is agreeable to OOB if author willing to get him more apple juice afterwards. Pt was able to exit R side of bed, stand with HHA +1 with 2nd person for safety. Tolerated ambulation with vcing for heel strike and step length improvements. Pt does remain at high risk of falls. Cranial helmet donned throughout. Acute PT will continue to follow and progress per current POC.    If plan is discharge home, recommend the following: A lot of help with bathing/dressing/bathroom;Assistance with cooking/housework;Direct supervision/assist for financial management;Supervision due to cognitive status;Help with stairs or ramp for entrance;Direct supervision/assist for medications management;Assist for transportation;Two people to help with walking and/or transfers     Equipment Recommendations  None recommended by PT (Defer to next level of care ')       Precautions / Restrictions Precautions Precautions: Fall Recall of Precautions/Restrictions: Impaired Precaution/Restrictions Comments: helmet with mobility Restrictions Weight Bearing Restrictions Per Provider Order: No     Mobility  Bed Mobility Overal bed mobility: Needs Assistance Bed Mobility: Supine to Sit, Sit to Supine  Supine to sit: Supervision Sit to supine: Supervision General bed mobility comments: no physical assistance to get out/into bed.    Transfers Overall transfer level: Needs assistance Equipment used: 1 person hand held  assist, 2 person hand held assist Transfers: Sit to/from Stand Sit to Stand: Contact guard assist, Min assist  General transfer comment: HHA +1 with 2nd person close for additional safety    Ambulation/Gait Ambulation/Gait assistance: Min assist, +2 safety/equipment Gait Distance (Feet): 200 Feet Assistive device: 1 person hand held assist, 2 person hand held assist Gait Pattern/deviations: Step-through pattern Gait velocity: decreased  General Gait Details: Pt was able to ambulate 200 ft with mostly +1 HHA however +2 HHA for additional safety at times. Vcs for improved step length and constant vcs for heel strike to toe off. pt tends to land/ start stance phase with flat foot    Balance Overall balance assessment: Needs assistance Sitting-balance support: Feet supported Sitting balance-Leahy Scale: Good     Standing balance support: Single extremity supported Standing balance-Leahy Scale: Fair Standing balance comment: pt does remain fall risk due to poor safety awareness and poor insight of his deficits. Cranial helmet still used for all/any OOB activity         Communication Communication Communication: No apparent difficulties Factors Affecting Communication: Reduced clarity of speech (mumbles)  Cognition Arousal: Lethargic Behavior During Therapy: WFL for tasks assessed/performed, Flat affect   PT - Cognitive impairments: Awareness, Safety/Judgement, Problem solving    Rancho Levels of Cognitive Functioning Rancho Los Amigos Scales of Cognitive Functioning: Confused, Appropriate: Moderate Assistance [email or web address] Scales of Cognitive Functioning: Confused, Appropriate: Moderate Assistance [VI]   Following commands: Intact Following commands impaired: Follows one step commands inconsistently, Follows one step commands with increased time    Cueing Cueing Techniques: Verbal cues, Tactile cues, Visual cues         Pertinent Vitals/Pain Pain Assessment Pain  Assessment: No/denies pain           PT Goals (current goals can now be found in the care plan section) Acute Rehab PT Goals Patient Stated Goal: none stated Progress towards PT goals: Progressing toward goals    Frequency    Min 3X/week       Co-evaluation     PT goals addressed during session: Mobility/safety with mobility;Balance;Proper use of DME;Strengthening/ROM        AM-PAC PT 6 Clicks Mobility   Outcome Measure  Help needed turning from your back to your side while in a flat bed without using bedrails?: A Little Help needed moving from lying on your back to sitting on the side of a flat bed without using bedrails?: A Little Help needed moving to and from a bed to a chair (including a wheelchair)?: A Little Help needed standing up from a chair using your arms (e.g., wheelchair or bedside chair)?: A Little Help needed to walk in hospital room?: A Little Help needed climbing 3-5 steps with a railing? : A Little 6 Click Score: 18    End of Session Equipment Utilized During Treatment: Gait belt (sitter had gait belt left on patient throughout the day due to pt's impulsively getting up many times) Activity Tolerance: Patient tolerated treatment well;Patient limited by fatigue Patient left: in bed;with call bell/phone within reach;with bed alarm set;with nursing/sitter in room Nurse Communication: Mobility status PT Visit Diagnosis: Other symptoms and signs involving the nervous system (R29.898);Difficulty in walking, not elsewhere classified (R26.2);Muscle weakness (generalized) (M62.81);Other abnormalities of gait and mobility (R26.89);Unsteadiness on feet (R26.81)     Time: 8587-8577 PT Time Calculation (min) (ACUTE ONLY): 10 min  Charges:    $Gait Training: 8-22 mins PT General Charges $$ ACUTE PT VISIT: 1 Visit                     Rankin Essex PTA 07/12/24, 4:00 PM

## 2024-07-12 NOTE — Plan of Care (Signed)
 Pt had to have 2 PRN ativan  and 1 haldol  dose. He was up and down and very restless all day

## 2024-07-12 NOTE — Progress Notes (Signed)
  PROGRESS NOTE    Ernest Romero  FMW:[id number] DOB: 1965-10-16 DOA: 06/30/2024 [facility]CP: [name]  108A/108A-AA  LOS: 12 days   Brief hospital course:   Assessment & Plan: 58 y.o. male who presented to [facility] ED for evaluation of a subdural hematoma. Patient presented from local jail for the evaluation of unresponsiveness. Previous Head CT has shown small subdural hematomas over the past few weeks  According to the jail staff, the patient had been incarcerated for approximately 12 hours prior to this acute change in mental status. There were no signs to suggest trauma, injuries or falls.  Due to AMS, concern for airway protection and respiratory failure, pt was intubated in ER. CT with left SDH with significant midline shift, s/p left fronto parietal craniotomy by Neurosurgery. Hospital course as below   Subdural hematoma (HCC) - Status post left frontal parietal craniotomy with evacuation of subdural hematoma and placement of a drain.  Drain removed on 9/29.   - Neurosurgical team placed an order for helmet.   --discussed with neurosurgery today, no need for seizure ppx --PT/OT - Repeat CT in 6 weeks as outpatient, Neurosurgery follow up   Alcohol withdrawal (HCC) - S/p phenobarbital taper --cont thiamine  and folic acid    Acute Encephalopathy - Improving - Suspect behavioral/agitation likely due to acute alcohol withdrawal - May also have some changes due to recent craniotomy, underlying bipolar disorder - SLP following for cognitive/lang eval - Has 1:1 sitter, wean as able --IV haldol  and ativan  PRN   Malnutrition of moderate degree --supplements per dietician   Bipolar disorder - Seen by Psychiatry, appreciate recs - Reportedly follows up with psychiatry, Dr.Su [phone]) --cont Lithium  and Latuda    Essential hypertension --cont amlodipine    Hypophosphatemia - Repleted   Social determinants - Due to acute changes patient does not have  decision-making capacity at this time, is AO x 2, Patient's support counselor Ernest Romero was at the bedside - [phone] - states does not have kids, his spouse passed away. Has sister, estranged. Will need guardianship - TOC aware    DVT prophylaxis: Lovenox  SQ Code Status: Full code  Family Communication:  Level of care: Telemetry Medical Dispo:   The patient is from: homeless Anticipated d/c is to: [facility] Anticipated d/c date is: whenever [facility] has a bed for pt   Subjective and Interval History:  No change today.   Objective: Vitals:   07/11/24 1908 07/12/24 0432 07/12/24 0737 07/12/24 1727  BP: (!) 139/102 (!) 141/83 (!) 148/105 (!) 146/113  Pulse: 99 64 65 76  Resp: 18 20 18  (!) 21  Temp: (!) 97.5 F (36.4 C) 98.2 F (36.8 C) 98.4 F (36.9 C) 97.9 F (36.6 C)  TempSrc: Oral Oral  Oral  SpO2: 99% 96% 98% 100%  Weight:  68.1 kg    Height:        Intake/Output Summary (Last 24 hours) at 07/12/2024 1933 Last data filed at 07/12/2024 1401 Gross per 24 hour  Intake 657 ml  Output 625 ml  Net 32 ml   Filed Weights   07/10/24 0348 07/11/24 0500 07/12/24 0432  Weight: 70.6 kg 68.8 kg 68.1 kg    Examination:   Constitutional: NAD CV: No cyanosis.   RESP: normal respiratory effort, on RA   Data Reviewed: I have personally reviewed labs and imaging studies  Time spent: 25 minutes  Ellouise Haber, MD [facility] If 7PM-7AM, please contact night-coverage 07/12/2024, 7:33 PM

## 2024-07-13 DIAGNOSIS — S065XAA Traumatic subdural hemorrhage with loss of consciousness status unknown, initial encounter: Secondary | ICD-10-CM | POA: Diagnosis not present

## 2024-07-13 LAB — GLUCOSE, CAPILLARY: Glucose-Capillary: 100 mg/dL — ABNORMAL HIGH (ref 70–99)

## 2024-07-13 MED ORDER — ZIPRASIDONE MESYLATE 20 MG IM SOLR
10.0000 mg | Freq: Four times a day (QID) | INTRAMUSCULAR | Status: DC | PRN
Start: 2024-07-13 — End: 2024-07-15
  Administered 2024-07-13 – 2024-07-15 (×4): 10 mg via INTRAMUSCULAR
  Filled 2024-07-13 (×5): qty 20

## 2024-07-13 NOTE — Progress Notes (Signed)
 SLP Cancellation Note  Patient Details Name: Ernest Romero MRN: [id number] DOB: Nov 07, 1965   Cancelled treatment:       Reason Eval/Treat Not Completed: Other (comment) RN reporting increased agitation this AM during mobility. Pt is now resting, peaceful in room with lights off. SLP will hold session to allow pt to continue to rest. RN aware of plan.   [place] Aldora Perman Clapp, MS, CCC-SLP Speech Language Pathologist Rehab Services; [facility] [phone] (ascom)      [name] 07/13/2024, 1:00 PM

## 2024-07-13 NOTE — Plan of Care (Signed)
  Problem: Clinical Measurements: Goal: Ability to maintain clinical measurements within normal limits will improve Outcome: Progressing Goal: Will remain free from infection Outcome: Progressing Goal: Cardiovascular complication will be avoided Outcome: Progressing   Problem: Nutrition: Goal: Adequate nutrition will be maintained Outcome: Progressing   

## 2024-07-13 NOTE — Progress Notes (Signed)
 PROGRESS NOTE    Ernest Romero  FMW:[id number] DOB: [birth year] DOA: 06/30/2024 PCP: [name]  108A/108A-AA  LOS: 13 days   Brief hospital course:   Assessment & Plan: 58 y.o. male who presented to [facility] ED for evaluation of a subdural hematoma. Patient presented from local jail for the evaluation of unresponsiveness. Previous Head CT has shown small subdural hematomas over the past few weeks  According to the jail staff, the patient had been incarcerated for approximately 12 hours prior to this acute change in mental status. There were no signs to suggest trauma, injuries or falls.  Due to AMS, concern for airway protection and respiratory failure, pt was intubated in ER. CT with left SDH with significant midline shift, s/p left fronto parietal craniotomy by Neurosurgery. Hospital course as below   Subdural hematoma (HCC) - Status post left frontal parietal craniotomy with evacuation of subdural hematoma and placement of a drain.  Drain removed on 9/29.   - Neurosurgical team placed an order for helmet.   --discussed with neurosurgery, no need for seizure ppx --PT/OT - Repeat CT in 6 weeks as outpatient, Neurosurgery follow up   Alcohol withdrawal (HCC) - S/p phenobarbital taper --cont thiamine  and folic acid    Acute Encephalopathy - Improving - Suspect behavioral/agitation likely due to acute alcohol withdrawal - May also have some changes due to recent craniotomy, underlying bipolar disorder - SLP following for cognitive/lang eval - Has 1:1 sitter, wean as able --IV haldol  and ativan  PRN, IM Geodon  PRN   Malnutrition of moderate degree --supplements per dietician   Bipolar disorder - Seen by Psychiatry, appreciate recs - Reportedly follows up with psychiatry, Dr.Su [phone]) --cont Lithium  and Latuda    Essential hypertension --cont amlodipine    Hypophosphatemia - Repleted   Social determinants - Due to acute changes patient does not  have decision-making capacity at this time, is AO x 2, Patient's support counselor [name] was at the bedside - [phone] - states does not have kids, his spouse passed away. Has sister, estranged. Will need guardianship - TOC aware    DVT prophylaxis: Lovenox  SQ Code Status: Full code  Family Communication:  Level of care: Telemetry Medical Dispo:   The patient is from: homeless Anticipated d/c is to: [facility] Anticipated d/c date is: whenever [facility] has a bed for pt   Subjective and Interval History:  Continued to need frequent sedation to keep from getting up and falling.   Objective: Vitals:   07/12/24 1727 07/12/24 2006 07/13/24 0500 07/13/24 0826  BP: (!) 146/113 (!) 161/99  (!) 151/120  Pulse: 76 80  (!) 101  Resp: (!) 21 16  19   Temp: 97.9 F (36.6 C) 97.8 F (36.6 C)  97.6 F (36.4 C)  TempSrc: Oral Oral  Oral  SpO2: 100% 100%  100%  Weight:   65.8 kg   Height:        Intake/Output Summary (Last 24 hours) at 07/13/2024 1854 Last data filed at 07/13/2024 1350 Gross per 24 hour  Intake 0 ml  Output 750 ml  Net -750 ml   Filed Weights   07/11/24 0500 07/12/24 0432 07/13/24 0500  Weight: 68.8 kg 68.1 kg 65.8 kg    Examination:   Constitutional: NAD CV: No cyanosis.   RESP: normal respiratory effort, on RA   Data Reviewed: I have personally reviewed labs and imaging studies  Time spent: 25 minutes  Ellouise Haber, MD [facility] If 7PM-7AM, please contact night-coverage 07/13/2024, 6:54  PM

## 2024-07-14 ENCOUNTER — Inpatient Hospital Stay: Payer: MEDICAID

## 2024-07-14 DIAGNOSIS — S065XAA Traumatic subdural hemorrhage with loss of consciousness status unknown, initial encounter: Secondary | ICD-10-CM | POA: Diagnosis not present

## 2024-07-14 LAB — BASIC METABOLIC PANEL WITH GFR
Anion gap: 15 (ref 5–15)
BUN: 16 mg/dL (ref 6–20)
CO2: 24 mmol/L (ref 22–32)
Calcium: 9.9 mg/dL (ref 8.9–10.3)
Chloride: 101 mmol/L (ref 98–111)
Creatinine, Ser: 0.98 mg/dL (ref 0.61–1.24)
GFR, Estimated: >60 mL/min (ref >60)
Glucose, Bld: 100 mg/dL — ABNORMAL HIGH (ref 70–99)
Potassium: 4.1 mmol/L (ref 3.5–5.1)
Sodium: 140 mmol/L (ref 135–145)

## 2024-07-14 LAB — CBC
HCT: 42.3 % (ref 39.0–52.0)
Hemoglobin: 14.3 g/dL (ref 13.0–17.0)
MCH: 29.9 pg (ref 26.0–34.0)
MCHC: 33.8 g/dL (ref 30.0–36.0)
MCV: 88.3 fL (ref 80.0–100.0)
Platelets: 464 K/uL — ABNORMAL HIGH (ref 150–400)
RBC: 4.79 MIL/uL (ref 4.22–5.81)
RDW: 12.8 % (ref 11.5–15.5)
WBC: 10.2 K/uL (ref 4.0–10.5)
nRBC: 0 % (ref 0.0–0.2)

## 2024-07-14 LAB — MAGNESIUM: Magnesium: 2.2 mg/dL (ref 1.7–2.4)

## 2024-07-14 MED ORDER — ACETAMINOPHEN 500 MG PO TABS
1000.0000 mg | ORAL_TABLET | Freq: Three times a day (TID) | ORAL | Status: DC | PRN
  Administered 2024-07-14: 1000 mg via ORAL
  Filled 2024-07-14: qty 2

## 2024-07-14 MED ORDER — PROCHLORPERAZINE EDISYLATE 10 MG/2ML IJ SOLN
5.0000 mg | Freq: Once | INTRAMUSCULAR | Status: AC | PRN
  Administered 2024-07-14: 5 mg via INTRAVENOUS
  Filled 2024-07-14: qty 1

## 2024-07-14 MED ORDER — DIPHENHYDRAMINE HCL 50 MG/ML IJ SOLN
25.0000 mg | Freq: Once | INTRAMUSCULAR | Status: AC | PRN
  Administered 2024-07-14: 25 mg via INTRAVENOUS
  Filled 2024-07-14: qty 1

## 2024-07-14 NOTE — Progress Notes (Signed)
 Mobility Specialist - Progress Note   07/14/24 1600  Mobility  Activity Ambulated with assistance  Level of Assistance Moderate assist, patient does 50-74%  Distance Ambulated (ft) 15 ft  Range of Motion/Exercises Active;Right leg;Left leg  Mobility visit 1 Mobility     Pt restless upon arrival, states his legs won't stop moving. Anxious. Pt completed bed mobility with supervision. STS with CGA and ambulation in room with min-mod HHA +1. Second assist readily available for safety. Pt reports difficulty getting comfortable and states he's having a panic attack at end of session. Pt returned to bed with sitter present. RN notified.    Lennette Seip Mobility Specialist 07/14/24, 4:37 PM

## 2024-07-14 NOTE — Plan of Care (Signed)
 Pt agitated and anxious. Security was called several times; geodon  and Ativan  given at all times for agitation. Problem: Clinical Measurements: Goal: Ability to maintain clinical measurements within normal limits will improve Outcome: Progressing   Problem: Safety: Goal: Ability to remain free from injury will improve Outcome: Progressing

## 2024-07-14 NOTE — Progress Notes (Signed)
 PROGRESS NOTE    Ernest Romero  FMW:[id number] DOB: 1966/09/11 DOA: 06/30/2024 PCP: [name]  108A/108A-AA  LOS: 14 days   Brief hospital course:   Assessment & Plan: 58 y.o. male who presented to [facility] ED for evaluation of a subdural hematoma. Patient [facility] from [facility] jail for the evaluation of unresponsiveness. Previous Head CT has shown small subdural hematomas over the past few weeks  According to the jail staff, the patient had been incarcerated for approximately 12 hours prior to this acute change in mental status. There were no signs to suggest trauma, injuries or falls.  Due to AMS, concern for airway protection and respiratory failure, pt was intubated in ER. CT with left SDH with significant midline shift, s/p left fronto parietal craniotomy by Neurosurgery. Hospital course as below   Subdural hematoma (HCC) - Status post left frontal parietal craniotomy with evacuation of subdural hematoma and placement of a drain.  Drain removed on 9/29.   - Neurosurgical team placed an order for helmet.   --discussed with neurosurgery, no need for seizure ppx --PT/OT --plan to transfer to [facility] for safer unit to protect pt from falling and hitting his head. - Repeat CT in 6 weeks as outpatient, Neurosurgery follow up  Headache --CT head showed no acute finding. --benadryl  and compazine PRN for headache   Alcohol withdrawal (HCC) - S/p phenobarbital taper --cont thiamine  and folic acid    Acute Encephalopathy - Improving - Suspect behavioral/agitation likely due to acute alcohol withdrawal - May also have some changes due to recent craniotomy, underlying bipolar disorder - SLP following for cognitive/lang eval - Has 1:1 sitter, wean as able --IV haldol  and ativan  PRN, IM Geodon  PRN   Malnutrition of moderate degree --supplement per dietician   Bipolar disorder - Seen by Psychiatry, appreciate recs - Reportedly follows up with psychiatry, Dr.Su  [phone]) --cont Lithium  and Latuda    Essential hypertension --cont amlodipine    Hypophosphatemia - Repleted   Social determinants - Due to acute changes patient does not have decision-making capacity at this time, is AO x 2, Patient's support counselor [name] was at the bedside - [phone] - states does not have kids, his spouse passed away. Has sister, estranged. Will need guardianship - TOC aware    DVT prophylaxis: Lovenox  SQ Code Status: Full code  Family Communication:  Level of care: Telemetry Medical Dispo:   The patient is from: homeless Anticipated d/c is to: [facility] Anticipated d/c date is: whenever [facility] has a bed for pt   Subjective and Interval History:  Pt was yelling for Klonopin  during rounds.  In the afternoon, pt started complaining of headache.  STAT CT head reviewed by neurosurgery, no acute concerning finding.    Objective: Vitals:   07/13/24 2044 07/14/24 0404 07/14/24 0615 07/14/24 1049  BP: (!) 143/99  128/74 (!) 149/106  Pulse: 70  68 82  Resp:   16 20  Temp: 97.8 F (36.6 C)  97.8 F (36.6 C) 98.1 F (36.7 C)  TempSrc: Oral  Oral   SpO2: 100%  100% 94%  Weight:  64.2 kg    Height:        Intake/Output Summary (Last 24 hours) at 07/14/2024 1757 Last data filed at 07/14/2024 1105 Gross per 24 hour  Intake 240 ml  Output 1450 ml  Net -1210 ml   Filed Weights   07/12/24 0432 07/13/24 0500 07/14/24 0404  Weight: 68.1 kg 65.8 kg 64.2 kg    Examination:  Constitutional: NAD, alert HEENT: conjunctivae and lids normal, EOMI CV: No cyanosis.   RESP: normal respiratory effort, on RA Psych: agitated mood and affect.     Data Reviewed: I have personally reviewed labs and imaging studies  Time spent: 50 minutes  Ellouise Haber, MD [facility] If 7PM-7AM, please contact night-coverage 07/14/2024, 5:57 PM

## 2024-07-14 NOTE — Plan of Care (Signed)

## 2024-07-15 ENCOUNTER — Inpatient Hospital Stay: Payer: MEDICAID

## 2024-07-15 ENCOUNTER — Inpatient Hospital Stay (HOSPITAL_COMMUNITY): Admit: 2024-07-15 | Payer: MEDICAID | Source: Home / Self Care | Attending: Family Medicine | Admitting: Family Medicine

## 2024-07-15 ENCOUNTER — Encounter (HOSPITAL_COMMUNITY): Payer: Self-pay

## 2024-07-15 ENCOUNTER — Telehealth: Payer: Self-pay

## 2024-07-15 ENCOUNTER — Encounter: Payer: MEDICAID | Admitting: Physician Assistant

## 2024-07-15 DIAGNOSIS — G9341 Metabolic encephalopathy: Secondary | ICD-10-CM | POA: Insufficient documentation

## 2024-07-15 DIAGNOSIS — T847XXA Infection and inflammatory reaction due to other internal orthopedic prosthetic devices, implants and grafts, initial encounter: Secondary | ICD-10-CM | POA: Diagnosis present

## 2024-07-15 DIAGNOSIS — S065XAA Traumatic subdural hemorrhage with loss of consciousness status unknown, initial encounter: Secondary | ICD-10-CM | POA: Diagnosis not present

## 2024-07-15 DIAGNOSIS — Z556 Problems related to health literacy: Secondary | ICD-10-CM

## 2024-07-15 DIAGNOSIS — I10 Essential (primary) hypertension: Secondary | ICD-10-CM | POA: Diagnosis present

## 2024-07-15 DIAGNOSIS — F99 Mental disorder, not otherwise specified: Secondary | ICD-10-CM

## 2024-07-15 DIAGNOSIS — E43 Unspecified severe protein-calorie malnutrition: Secondary | ICD-10-CM | POA: Insufficient documentation

## 2024-07-15 DIAGNOSIS — F319 Bipolar disorder, unspecified: Secondary | ICD-10-CM | POA: Diagnosis present

## 2024-07-15 DIAGNOSIS — F10939 Alcohol use, unspecified with withdrawal, unspecified: Secondary | ICD-10-CM | POA: Diagnosis present

## 2024-07-15 HISTORY — DX: Methicillin resistant Staphylococcus aureus infection, unspecified site: A49.02

## 2024-07-15 LAB — BASIC METABOLIC PANEL WITH GFR
Anion gap: 10 (ref 5–15)
BUN: 27 mg/dL — ABNORMAL HIGH (ref 6–20)
CO2: 24 mmol/L (ref 22–32)
Calcium: 9.5 mg/dL (ref 8.9–10.3)
Chloride: 106 mmol/L (ref 98–111)
Creatinine, Ser: 0.78 mg/dL (ref 0.61–1.24)
GFR, Estimated: >60 mL/min (ref >60)
Glucose, Bld: 96 mg/dL (ref 70–99)
Potassium: 4.1 mmol/L (ref 3.5–5.1)
Sodium: 140 mmol/L (ref 135–145)

## 2024-07-15 LAB — CBC
HCT: 40.7 % (ref 39.0–52.0)
Hemoglobin: 13.8 g/dL (ref 13.0–17.0)
MCH: 29.7 pg (ref 26.0–34.0)
MCHC: 33.9 g/dL (ref 30.0–36.0)
MCV: 87.7 fL (ref 80.0–100.0)
Platelets: 434 K/uL — ABNORMAL HIGH (ref 150–400)
RBC: 4.64 MIL/uL (ref 4.22–5.81)
RDW: 12.9 % (ref 11.5–15.5)
WBC: 8.5 K/uL (ref 4.0–10.5)
nRBC: 0 % (ref 0.0–0.2)

## 2024-07-15 LAB — GLUCOSE, CAPILLARY: Glucose-Capillary: 124 mg/dL — ABNORMAL HIGH (ref 70–99)

## 2024-07-15 LAB — MAGNESIUM: Magnesium: 2.5 mg/dL — ABNORMAL HIGH (ref 1.7–2.4)

## 2024-07-15 MED ORDER — FOLIC ACID 1 MG PO TABS: 1.0000 mg | ORAL_TABLET | Freq: Every day | ORAL | Status: AC

## 2024-07-15 MED ORDER — ENSURE PLUS HIGH PROTEIN PO LIQD: 237.0000 mL | Freq: Three times a day (TID) | ORAL | Status: AC

## 2024-07-15 MED ORDER — ADULT MULTIVITAMIN W/MINERALS CH: 1.0000 | ORAL_TABLET | Freq: Every day | ORAL | Status: AC

## 2024-07-15 MED ORDER — ZIPRASIDONE MESYLATE 20 MG IM SOLR: 10.0000 mg | Freq: Four times a day (QID) | INTRAMUSCULAR | Status: AC | PRN

## 2024-07-15 MED ORDER — CLONAZEPAM 0.5 MG PO TABS
1.0000 mg | ORAL_TABLET | Freq: Once | ORAL | Status: AC
  Administered 2024-07-15: 1 mg via ORAL
  Filled 2024-07-15: qty 2

## 2024-07-15 MED ORDER — VITAMIN B-1 100 MG PO TABS: 100.0000 mg | ORAL_TABLET | Freq: Every day | ORAL | Status: AC

## 2024-07-15 MED ORDER — LORAZEPAM 2 MG/ML IJ SOLN
1.0000 mg | Freq: Four times a day (QID) | INTRAMUSCULAR | Status: DC | PRN
  Administered 2024-07-15: 1 mg via INTRAVENOUS

## 2024-07-15 MED ORDER — ACETAMINOPHEN 500 MG PO TABS: 1000.0000 mg | ORAL_TABLET | Freq: Three times a day (TID) | ORAL | Status: AC | PRN

## 2024-07-15 MED ORDER — NICOTINE 21 MG/24HR TD PT24: 21.0000 mg | MEDICATED_PATCH | Freq: Every day | TRANSDERMAL | Status: AC

## 2024-07-15 MED ORDER — LURASIDONE HCL 20 MG PO TABS: 40.0000 mg | ORAL_TABLET | Freq: Every day | ORAL | Status: AC

## 2024-07-15 MED ORDER — PROSOURCE PLUS PO LIQD: 30.0000 mL | Freq: Two times a day (BID) | ORAL | Status: AC

## 2024-07-15 NOTE — Progress Notes (Signed)
 Patient arrived to floor at 2300. Extremely agitated and anxious.  Secure-chatted attending MD (Dr. Franky) and verbal order given for 1mg  IV ativan .  Pulled and administered at 2325.

## 2024-07-15 NOTE — Progress Notes (Addendum)
 OT Cancellation Note  Patient Details Name: Ernest Romero MRN: [id number] DOB: 17-Oct-1965   Cancelled Treatment:    Reason Eval/Treat Not Completed: Fatigue/lethargy limiting ability to participate. Chart reviewed. Contacted RN who states pt is still sleeping. Had Geodon  for agitation earlier this am. Will re-attempt at later date/time as pt is able to participate.   Addendum, 3pm: Pt received Ativan  earlier and remains lethargic. Will re-attempt at later date/time.   Mariaisabel Bodiford R., MPH, MS, OTR/L ascom [phone] 07/15/24, 12:24 PM

## 2024-07-15 NOTE — Progress Notes (Signed)
 This Clinical research associate called [facility] in [place] to give the nurse report. This Clinical research associate was told that they would call this Clinical research associate back.

## 2024-07-15 NOTE — Plan of Care (Signed)

## 2024-07-15 NOTE — Progress Notes (Signed)
 This Clinical research associate just called [facility] [place] in [place] back and gave the receiving nurse report. The patient left with [facility] via strecther with his helmet at 2120.

## 2024-07-15 NOTE — Progress Notes (Signed)
 Speech Language Pathology Treatment: Cognitive-Linguistic  Patient Details Name: Ernest Romero MRN: [id number] DOB: 1966/05/13 Today's Date: 07/15/2024 Time: 1150-1200 SLP Time Calculation (min) (ACUTE ONLY): 10 min  Assessment / Plan / Recommendation Clinical Impression  Pt seen for limited session this date. Pt lethargic, per chart review receiving Geodon  this AM. Upon entrance to room, pt alert, restless in bed. All attempts at interaction regarding pain, orientation, and functional recall, met with one word response. Pt indicating desire to sleep despite efforts in engagement. SLP ended session d/t lethargy. Education shared with sitter in room for opportunities for functional engagement when alert.   Continue to suspect that current cognitive deficits are multifocal in nature- related to sedation/medication, acute neuro injury, and baseline mental health diagnoses. Chart review revealing a potential for RLA V- though limited insight gained during this session. SLP services are warranted to aid cognitive rehab s/p craniectomy; however, overall medical picture limits efficacy of intervention. SLP will continue attempt intervention during acute stay.     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

## 2024-07-15 NOTE — Discharge Summary (Signed)
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 Visualized skull base is intact. No atlanto-occipital dissociation. C1 and C2 appear intact and aligned. No acute osseous abnormality identified. Soft tissues and spinal canal: No prevertebral fluid or swelling. No visible canal hematoma. Mildly motion degraded neck soft tissues. Disc levels: Chronic cervical disc and endplate degeneration at C5-C6 is moderate to severe, stable since July. Suspected mild chronic spinal stenosis there and at C6-C7. Upper chest: Visible upper thoracic levels appear intact. Negative lung apices. IMPRESSION: 1. Mildly motion degraded exam. No acute traumatic injury identified in the cervical spine. 2. Chronic degeneration at C5-C6 and C6-C7 with suspected mild chronic spinal stenosis. Electronically Signed   By: VEAR Hurst M.D.   On: 06/16/2024 05:57      Labs: BNP (last 3 results) No results for input(s): BNP in the last 8760 hours. Basic Metabolic Panel: Recent Labs  Lab 07/14/24 1114 07/15/24 0454  NA 140 140  K 4.1 4.1  CL 101 106  CO2 24 24  GLUCOSE 100* 96  BUN 16 27*  CREATININE 0.98 0.78  CALCIUM 9.9 9.5  MG 2.2 2.5*   Liver Function Tests: No results  for input(s): AST, ALT, ALKPHOS, BILITOT, PROT, ALBUMIN in the last 168 hours. No results for input(s): LIPASE, AMYLASE in the last 168 hours. No results for input(s): AMMONIA in the last 168 hours. CBC: Recent Labs  Lab 07/14/24 1114 07/15/24 0454  WBC 10.2 8.5  HGB 14.3 13.8  HCT 42.3 40.7  MCV 88.3 87.7  PLT 464* 434*   Cardiac Enzymes: No results for input(s): CKTOTAL, CKMB, CKMBINDEX, TROPONINI in the last 168 hours. BNP: Invalid input(s): POCBNP CBG: Recent Labs  Lab 07/11/24 1707 07/12/24 0734 07/12/24 1958 07/13/24 0818 07/15/24 1856  GLUCAP 108* 103* 108* 100* 124*   D-Dimer No results for input(s): DDIMER in the last 72 hours. Hgb A1c No results for input(s): HGBA1C in the last 72 hours. Lipid Profile No results for input(s): CHOL, HDL, LDLCALC, TRIG, CHOLHDL, LDLDIRECT in the last 72 hours. Thyroid function studies No results for input(s): TSH, T4TOTAL, T3FREE, THYROIDAB in the last 72 hours.  Invalid input(s): FREET3 Anemia work up No results for input(s): VITAMINB12, FOLATE, FERRITIN, TIBC, IRON, RETICCTPCT in the last 72 hours. Urinalysis    Component Value Date/Time   COLORURINE YELLOW 04/15/2024 1027   APPEARANCEUR HAZY (A) 04/15/2024 1027   LABSPEC 1.021 04/15/2024 1027   PHURINE 5.0 04/15/2024 1027   GLUCOSEU NEGATIVE 04/15/2024 1027   HGBUR NEGATIVE 04/15/2024 1027   BILIRUBINUR NEGATIVE 04/15/2024 1027   KETONESUR NEGATIVE 04/15/2024 1027   PROTEINUR 30 (A) 04/15/2024 1027   NITRITE NEGATIVE 04/15/2024 1027   LEUKOCYTESUR NEGATIVE 04/15/2024 1027   Sepsis Labs Recent Labs  Lab 07/14/24 1114 07/15/24 0454  WBC 10.2 8.5   Microbiology No results found for this or any previous visit (from the past 240 hours).   Total time spend on discharging this patient, including the last  patient exam, discussing the hospital stay, instructions for ongoing care as it relates to all  pertinent caregivers, as well as preparing the medical discharge records, prescriptions, and/or referrals as applicable, is 100 minutes.    Ellouise Haber, MD  [facility] 07/15/2024, 8:37 PM

## 2024-07-15 NOTE — Progress Notes (Signed)
 Order to discontinue CVC and place PIV. Arrived to room to find patient had already been picked up by [facility] and was en route to [facility]. [place] RN notified.

## 2024-07-15 NOTE — Telephone Encounter (Signed)
 Spoke with Joeann Sharps [phone]) with [facility] to initiate creation of 3D cranioplasty. Joeann will contact me after their team loads the head CT from 07/14/24 to their system.

## 2024-07-15 NOTE — Progress Notes (Signed)
 I saw [name] this morning for incision check and staple removal. Incision was c/d/I and healing well. Staples were removed with incidence.   There is ongoing discussion regarding transferring the patient to [facility] for further care. Neurosurgery will need to coordinate his flap placement at some point in the next 4-6 weeks.  Corben Auzenne PA-C

## 2024-07-16 ENCOUNTER — Encounter (HOSPITAL_COMMUNITY): Payer: Self-pay | Admitting: Internal Medicine

## 2024-07-16 ENCOUNTER — Other Ambulatory Visit: Payer: Self-pay

## 2024-07-16 DIAGNOSIS — I10 Essential (primary) hypertension: Secondary | ICD-10-CM | POA: Diagnosis not present

## 2024-07-16 DIAGNOSIS — S065XAA Traumatic subdural hemorrhage with loss of consciousness status unknown, initial encounter: Secondary | ICD-10-CM | POA: Diagnosis not present

## 2024-07-16 DIAGNOSIS — F319 Bipolar disorder, unspecified: Secondary | ICD-10-CM | POA: Diagnosis not present

## 2024-07-16 LAB — CBC WITH DIFFERENTIAL/PLATELET
Abs Immature Granulocytes: 0.04 K/uL (ref 0.00–0.07)
Basophils Absolute: 0.1 K/uL (ref 0.0–0.1)
Basophils Relative: 1 %
Eosinophils Absolute: 0.2 K/uL (ref 0.0–0.5)
Eosinophils Relative: 2 %
HCT: 38.8 % — ABNORMAL LOW (ref 39.0–52.0)
Hemoglobin: 13.5 g/dL (ref 13.0–17.0)
Immature Granulocytes: 0 %
Lymphocytes Relative: 25 %
Lymphs Abs: 2.3 K/uL (ref 0.7–4.0)
MCH: 29.9 pg (ref 26.0–34.0)
MCHC: 34.8 g/dL (ref 30.0–36.0)
MCV: 86 fL (ref 80.0–100.0)
Monocytes Absolute: 0.7 K/uL (ref 0.1–1.0)
Monocytes Relative: 8 %
Neutro Abs: 6 K/uL (ref 1.7–7.7)
Neutrophils Relative %: 64 %
Platelets: 438 K/uL — ABNORMAL HIGH (ref 150–400)
RBC: 4.51 MIL/uL (ref 4.22–5.81)
RDW: 12.5 % (ref 11.5–15.5)
WBC: 9.3 K/uL (ref 4.0–10.5)
nRBC: 0 % (ref 0.0–0.2)

## 2024-07-16 LAB — BASIC METABOLIC PANEL WITH GFR
Anion gap: 12 (ref 5–15)
BUN: 21 mg/dL — ABNORMAL HIGH (ref 6–20)
CO2: 21 mmol/L — ABNORMAL LOW (ref 22–32)
Calcium: 9.3 mg/dL (ref 8.9–10.3)
Chloride: 103 mmol/L (ref 98–111)
Creatinine, Ser: 1.04 mg/dL (ref 0.61–1.24)
GFR, Estimated: >60 mL/min (ref >60)
Glucose, Bld: 133 mg/dL — ABNORMAL HIGH (ref 70–99)
Potassium: 3.9 mmol/L (ref 3.5–5.1)
Sodium: 136 mmol/L (ref 135–145)

## 2024-07-16 LAB — HEPATIC FUNCTION PANEL
ALT: 85 U/L — ABNORMAL HIGH (ref 0–44)
AST: 48 U/L — ABNORMAL HIGH (ref 15–41)
Albumin: 3.4 g/dL — ABNORMAL LOW (ref 3.5–5.0)
Alkaline Phosphatase: 97 U/L (ref 38–126)
Bilirubin, Direct: 0.1 mg/dL (ref 0.0–0.2)
Indirect Bilirubin: 0.4 mg/dL (ref 0.3–0.9)
Total Bilirubin: 0.5 mg/dL (ref 0.0–1.2)
Total Protein: 6.8 g/dL (ref 6.5–8.1)

## 2024-07-16 MED ORDER — HALOPERIDOL LACTATE 5 MG/ML IJ SOLN
2.0000 mg | Freq: Once | INTRAMUSCULAR | Status: AC
  Administered 2024-07-16: 2 mg via INTRAVENOUS
  Filled 2024-07-16: qty 1

## 2024-07-16 MED ORDER — ACETAMINOPHEN 325 MG PO TABS
650.0000 mg | ORAL_TABLET | Freq: Four times a day (QID) | ORAL | Status: DC | PRN
  Administered 2024-07-16 – 2024-07-26 (×18): 650 mg via ORAL
  Filled 2024-07-16 (×18): qty 2

## 2024-07-16 MED ORDER — AMLODIPINE BESYLATE 5 MG PO TABS
5.0000 mg | ORAL_TABLET | Freq: Every day | ORAL | Status: DC
  Administered 2024-07-16 – 2024-07-23 (×8): 5 mg via ORAL
  Filled 2024-07-16 (×8): qty 1

## 2024-07-16 MED ORDER — THIAMINE MONONITRATE 100 MG PO TABS
100.0000 mg | ORAL_TABLET | Freq: Every day | ORAL | Status: DC
Start: 2024-07-16 — End: 2024-07-31
  Administered 2024-07-16 – 2024-07-30 (×13): 100 mg via ORAL
  Filled 2024-07-16 (×15): qty 1

## 2024-07-16 MED ORDER — CLONAZEPAM 1 MG PO TABS
1.0000 mg | ORAL_TABLET | Freq: Two times a day (BID) | ORAL | Status: DC
  Administered 2024-07-16 – 2024-07-30 (×28): 1 mg via ORAL
  Filled 2024-07-16 (×23): qty 1
  Filled 2024-07-16: qty 2
  Filled 2024-07-16 (×5): qty 1

## 2024-07-16 MED ORDER — CHLORHEXIDINE GLUCONATE CLOTH 2 % EX PADS
6.0000 | MEDICATED_PAD | Freq: Every day | CUTANEOUS | Status: DC
  Administered 2024-07-16 – 2024-10-06 (×73): 6 via TOPICAL

## 2024-07-16 MED ORDER — LITHIUM CARBONATE ER 450 MG PO TBCR
450.0000 mg | EXTENDED_RELEASE_TABLET | Freq: Every day | ORAL | Status: DC
  Administered 2024-07-16 – 2024-07-24 (×9): 450 mg via ORAL
  Filled 2024-07-16 (×10): qty 1

## 2024-07-16 MED ORDER — ACETAMINOPHEN 650 MG RE SUPP: 650.0000 mg | Freq: Four times a day (QID) | RECTAL | Status: DC | PRN

## 2024-07-16 MED ORDER — FOLIC ACID 1 MG PO TABS
1.0000 mg | ORAL_TABLET | Freq: Every day | ORAL | Status: DC
Start: 2024-07-16 — End: 2024-07-31
  Administered 2024-07-16 – 2024-07-30 (×13): 1 mg via ORAL
  Filled 2024-07-16 (×15): qty 1

## 2024-07-16 MED ORDER — LURASIDONE HCL 20 MG PO TABS
40.0000 mg | ORAL_TABLET | Freq: Every day | ORAL | Status: DC
  Administered 2024-07-16 – 2024-07-25 (×10): 40 mg via ORAL
  Filled 2024-07-16 (×10): qty 2

## 2024-07-16 MED ORDER — SODIUM CHLORIDE 0.9% FLUSH
10.0000 mL | INTRAVENOUS | Status: AC | PRN
  Administered 2024-07-29: 30 mL

## 2024-07-16 MED ORDER — ZIPRASIDONE MESYLATE 20 MG IM SOLR
10.0000 mg | Freq: Four times a day (QID) | INTRAMUSCULAR | Status: DC | PRN
  Filled 2024-07-16: qty 20

## 2024-07-16 MED ORDER — SODIUM CHLORIDE 0.9% FLUSH
10.0000 mL | Freq: Two times a day (BID) | INTRAVENOUS | Status: DC
  Administered 2024-07-16 – 2024-07-21 (×12): 10 mL
  Administered 2024-07-22 – 2024-07-23 (×3): 30 mL
  Administered 2024-07-23: 10 mL
  Administered 2024-07-24: 30 mL
  Administered 2024-07-24 – 2024-07-26 (×4): 10 mL
  Administered 2024-07-27: 20 mL
  Administered 2024-07-28 – 2024-08-01 (×9): 10 mL
  Administered 2024-08-02: 20 mL
  Administered 2024-08-02 – 2024-08-06 (×8): 10 mL
  Administered 2024-08-06: 30 mL
  Administered 2024-08-07 – 2024-08-16 (×18): 10 mL
  Administered 2024-08-16: 40 mL
  Administered 2024-08-17 – 2024-08-19 (×4): 10 mL
  Administered 2024-08-20: 20 mL
  Administered 2024-08-20 – 2024-08-21 (×2): 10 mL
  Administered 2024-08-21: 20 mL
  Administered 2024-08-22 (×2): 10 mL
  Administered 2024-08-23: 30 mL
  Administered 2024-08-23 – 2024-10-09 (×79): 10 mL
  Administered 2024-10-11: 30 mL
  Administered 2024-10-11 – 2024-10-14 (×5): 10 mL
  Administered 2024-10-14: 20 mL
  Administered 2024-10-15 – 2024-10-20 (×5): 10 mL

## 2024-07-16 MED ORDER — LORAZEPAM 2 MG/ML IJ SOLN
0.5000 mg | Freq: Once | INTRAMUSCULAR | Status: AC
  Administered 2024-07-16: 0.5 mg via INTRAVENOUS
  Filled 2024-07-16: qty 1

## 2024-07-16 MED ORDER — LORAZEPAM 2 MG/ML IJ SOLN
1.0000 mg | INTRAMUSCULAR | Status: DC | PRN
  Administered 2024-07-16 – 2024-07-17 (×6): 1 mg via INTRAVENOUS
  Filled 2024-07-16 (×6): qty 1

## 2024-07-16 NOTE — TOC Progression Note (Signed)
 Transition of Care [facility]) - Progression Note    Patient Details  Name: CHAZZ PHILSON MRN: [id number] Date of Birth: 1966-04-25  Transition of Care [facility]) CM/SW Contact  [name], [place] Phone Number: 07/16/2024, 4:21 PM  Clinical Narrative:     Update 10/15: IVC case number: [id number]   IVC petition submitted for patient. Envelope number: [id number]. Magistrate made aware, papers will be served to the unit. Case number pending.    Social Drivers of Health (SDOH) Interventions SDOH Screenings   Food Insecurity: Patient Unable To Answer (07/16/2024)  Housing: Unknown (07/16/2024)  Transportation Needs: Patient Unable To Answer (07/16/2024)  Utilities: Patient Unable To Answer (07/16/2024)  Alcohol Screen: Low Risk  (08/30/2022)  Depression (PHQ2-9): Medium Risk (08/30/2022)  Tobacco Use: Medium Risk (07/16/2024)    Readmission Risk Interventions    07/16/2024   12:23 PM  Readmission Risk Prevention Plan  Transportation Screening Complete  Medication Review (RN Care Manager) Complete  PCP or Specialist appointment within 3-5 days of discharge Complete  HRI or Home Care Consult Complete  SW Recovery Care/Counseling Consult Complete  Palliative Care Screening Not Applicable  Skilled Nursing Facility Not Applicable

## 2024-07-16 NOTE — Progress Notes (Signed)
 Imaging requested from [facility].

## 2024-07-16 NOTE — Plan of Care (Signed)
   Problem: Education: Goal: Knowledge of General Education information will improve Description Including pain rating scale, medication(s)/side effects and non-pharmacologic comfort measures Outcome: Progressing

## 2024-07-16 NOTE — Progress Notes (Addendum)
  PROGRESS NOTE    Ernest Romero  FMW:[id number] DOB: 07-25-1966 DOA: 07/15/2024 PCP: [name]   Same day admission note:  58 y.o. male with history of alcohol abuse, bipolar with panic disorder who was admitted on June 30, 2024 at [facility] hospital after patient was found to be unresponsive in the jail.  CT of the head showed large subdural hematoma and patient underwent left frontoparietal craniotomy with evacuation of the subdural hematoma and placement of a drain at [facility] on 06/30/2024 which was eventually removed.  He was transferred from [facility] to [facility] for flap as per neurosurgery.  Wrist restraints were taken off this morning.  He is currently drowsy because he received IV Ativan  along with the pain medication.  Hemodynamically stable.  He made some suicidal and homicidal comments so psych consulted and IVC orders placed. I did touch base with [name]. No acute neurosurgical issues identified.    [name], MD [facility]  If 7PM-7AM, please contact night-coverage  07/16/2024, 11:10 AM

## 2024-07-16 NOTE — Telephone Encounter (Signed)
 Received a call from [facility], who informed me that the CT scan is sufficient to create the bone flap. They will work on Armed forces operational officer and have it by this evening or tomorrow morning. Once they receive an approval from one of our neurosurgeons, they can make the implant in 48 hours. I have updated Dr [name], Dr Clois, and [name].

## 2024-07-16 NOTE — Telephone Encounter (Signed)
 Received call from Joeann Sharps (rep w/ [facility]). They reviewed the CT scan and the slices are too big. The slices are 2mm and they need to be 1.25 or less. I have the protocol scanned in his chart under the media tab dated 07/05/24 titled MEDCAD PROTOCOL FOR CT HEAD. I have notified Dr Clois and Dr Sharps.

## 2024-07-16 NOTE — Telephone Encounter (Addendum)
 Katie and I spoke with the CT department at [facility]. They are able to edit the thickness of the slices to 1mm and will burn a new CD. They will have it ready in about an hour. I notified Joeann and Dr Clois of this. Joeann will contact me after they load the new CD to their system.

## 2024-07-16 NOTE — H&P (Addendum)
 History and Physical    Ernest Romero FMW:[id number] DOB: 12/16/1965 DOA: 07/15/2024  Patient coming from: Patient was transferred from [facility].  Chief Complaint: Unresponsive episode in the jail and was brought to the ER on June 30, 2024.  HPI: Ernest Romero is a 58 y.o. male with history of alcohol abuse, bipolar with panic disorder who was admitted on June 30, 2024 at [facility] hospital after patient was found to be unresponsive in the jail.  CT of the head showed large subdural hematoma and patient underwent left frontoparietal craniotomy with evacuation of the subdural hematoma and placement of a drain which was eventually removed.  Patient was on alcohol withdrawal protocol with phenobarbital which was tapered off.  Patient is also on medications for his bipolar 1 panic disorder and also was noticed to be having frequent attacks of panic and agitation requiring multiple doses of sedation.  Patient was transferred to [facility] for further management of his craniotomy and flap.  Transfer was arranged by neurosurgery from [facility].  On my exam patient is watching video.  Denies any headache chest pain or shortness of breath.  Patient just received 1 mg IV Ativan  for his agitation.  Following the Ativan  he is more directable.  Patient's last labs on July 15, 2023 showed creatinine of 0.7 hemoglobin 13.8 WBC 8.5.  ED Course: Patient is a direct admit.  Review of Systems: As per HPI, rest all negative.   Past Medical History:  Diagnosis Date   Anxiety    Bipolar 1 disorder (HCC)    Depression    Hepatitis    Hypertension     Past Surgical History:  Procedure Laterality Date   CRANIOTOMY Left 06/30/2024   Procedure: CRANIOTOMY HEMATOMA EVACUATION SUBDURAL;  Surgeon: Melonie Grass, MD;  Location: [facility] ORS;  Service: Neurosurgery;  Laterality: Left;     reports that he has quit smoking. He has never used smokeless tobacco. He reports that he does not currently use alcohol. He  reports that he does not currently use drugs.  No Known Allergies  History reviewed. No pertinent family history.  Prior to Admission medications   Medication Sig Start Date End Date Taking? Authorizing Provider  acetaminophen  (TYLENOL ) 500 MG tablet Take 2 tablets (1,000 mg total) by mouth 3 (three) times daily as needed for mild pain (pain score 1-3), moderate pain (pain score 4-6) or headache. 07/15/24   Ernest City, MD  amLODipine  (NORVASC ) 5 MG tablet Take 5 mg by mouth daily. 11/28/23   [provider]  clonazePAM  (KLONOPIN ) 1 MG tablet Take 1 tablet (1 mg total) by mouth 2 (two) times daily. 02/25/24   Cleaster Tinnie LABOR, PA-C  feeding supplement (ENSURE PLUS HIGH PROTEIN) LIQD Take 237 mLs by mouth 3 (three) times daily between meals. 07/15/24   Ernest City, MD  folic acid  (FOLVITE ) 1 MG tablet Take 1 tablet (1 mg total) by mouth daily. 07/15/24   Ernest City, MD  lithium  carbonate (ESKALITH ) 450 MG CR tablet Take 1 tablet (450 mg total) by mouth at bedtime. 06/01/16   Sainani, Vivek J, MD  lurasidone  (LATUDA ) 20 MG TABS tablet Take 2 tablets (40 mg total) by mouth daily with breakfast. Increased from 20 mg. 07/15/24   Ernest City, MD  Multiple Vitamin (MULTIVITAMIN WITH MINERALS) TABS tablet Take 1 tablet by mouth daily. 07/15/24   Ernest City, MD  nicotine (NICODERM CQ - DOSED IN MG/24 HOURS) 21 mg/24hr patch Place 1 patch (21 mg total) onto the skin  daily. 07/15/24   Ernest City, MD  Nutritional Supplements (,FEEDING SUPPLEMENT, PROSOURCE PLUS) liquid Take 30 mLs by mouth 2 (two) times daily between meals. 07/15/24   Ernest City, MD  thiamine  (VITAMIN B-1) 100 MG tablet Take 1 tablet (100 mg total) by mouth daily. 07/15/24   Ernest City, MD  ziprasidone  (GEODON ) 20 MG injection Inject 10 mg into the muscle every 6 (six) hours as needed for agitation. 07/15/24   Ernest City, MD    Physical Exam: Constitutional: Moderately built and nourished. Vitals:   07/16/24 0114 07/16/24 0115  BP: (!)  153/101 (!) 153/101  Pulse: 78 77  Resp:  18  Temp:  98.7 F (37.1 C)  TempSrc:  Oral  SpO2: 99% 99%   Eyes: Anicteric no pallor. ENMT: No discharge from the ears eyes nose or mouth. Neck: No mass felt.  No neck rigidity. Respiratory: No rhonchi or crepitations. Cardiovascular: S1 S2 heard. Abdomen: Soft nontender bowel sound present. Musculoskeletal: No edema. Skin: No rash. Neurologic: Alert awake oriented time place and person.  Moves all extremities. Psychiatric: Appears normal.  Normal affect.   Labs on Admission: I have personally reviewed following labs and imaging studies  CBC: Recent Labs  Lab 07/14/24 1114 07/15/24 0454  WBC 10.2 8.5  HGB 14.3 13.8  HCT 42.3 40.7  MCV 88.3 87.7  PLT 464* 434*   Basic Metabolic Panel: Recent Labs  Lab 07/14/24 1114 07/15/24 0454  NA 140 140  K 4.1 4.1  CL 101 106  CO2 24 24  GLUCOSE 100* 96  BUN 16 27*  CREATININE 0.98 0.78  CALCIUM 9.9 9.5  MG 2.2 2.5*   GFR: Estimated Creatinine Clearance: 94.9 mL/min (by C-G formula based on SCr of 0.78 mg/dL). Liver Function Tests: No results for input(s): AST, ALT, ALKPHOS, BILITOT, PROT, ALBUMIN in the last 168 hours. No results for input(s): LIPASE, AMYLASE in the last 168 hours. No results for input(s): AMMONIA in the last 168 hours. Coagulation Profile: No results for input(s): INR, PROTIME in the last 168 hours. Cardiac Enzymes: No results for input(s): CKTOTAL, CKMB, CKMBINDEX, TROPONINI in the last 168 hours. BNP (last 3 results) No results for input(s): PROBNP in the last 8760 hours. HbA1C: No results for input(s): HGBA1C in the last 72 hours. CBG: Recent Labs  Lab 07/11/24 1707 07/12/24 0734 07/12/24 1958 07/13/24 0818 07/15/24 1856  GLUCAP 108* 103* 108* 100* 124*   Lipid Profile: No results for input(s): CHOL, HDL, LDLCALC, TRIG, CHOLHDL, LDLDIRECT in the last 72 hours. Thyroid Function Tests: No results  for input(s): TSH, T4TOTAL, FREET4, T3FREE, THYROIDAB in the last 72 hours. Anemia Panel: No results for input(s): VITAMINB12, FOLATE, FERRITIN, TIBC, IRON, RETICCTPCT in the last 72 hours. Urine analysis:    Component Value Date/Time   COLORURINE YELLOW 04/15/2024 1027   APPEARANCEUR HAZY (A) 04/15/2024 1027   LABSPEC 1.021 04/15/2024 1027   PHURINE 5.0 04/15/2024 1027   GLUCOSEU NEGATIVE 04/15/2024 1027   HGBUR NEGATIVE 04/15/2024 1027   BILIRUBINUR NEGATIVE 04/15/2024 1027   KETONESUR NEGATIVE 04/15/2024 1027   PROTEINUR 30 (A) 04/15/2024 1027   NITRITE NEGATIVE 04/15/2024 1027   LEUKOCYTESUR NEGATIVE 04/15/2024 1027   Sepsis Labs: @LABRCNTIP (procalcitonin:4,lacticidven:4) )No results found for this or any previous visit (from the past 240 hours).   Radiological Exams on Admission: CT HEAD WO CONTRAST ( ) Result Date: 07/14/2024 EXAM: CT HEAD WITHOUT CONTRAST 07/14/2024 05:14:55 PM TECHNIQUE: CT of the head was performed without the administration of intravenous contrast. Automated exposure  control, iterative reconstruction, and/or weight based adjustment of the mA/kV was utilized to reduce the radiation dose to as low as reasonably achievable. COMPARISON: None available. CLINICAL HISTORY: Headache in the setting of recent subdural hematoma. FINDINGS: BRAIN AND VENTRICLES: No new hemorrhage is present. No evidence of acute infarct. No hydrocephalus. The previously noted mass effect has near completely resolved. Residual extraaxial fluid anterior to the left frontal lobe measures up to 9 mm in thickness. Left frontoparietal craniectomy was again noted. The surgical drain was removed. ORBITS: No acute abnormality. SINUSES: No acute abnormality. SOFT TISSUES AND SKULL: Skin staples are present. Left frontoparietal craniectomy was again noted. IMPRESSION: 1. Status post left frontoparietal craniectomy with interval removal of surgical drain. No new hemorrhage. 2. Near  complete resolution of previously noted mass effect. 3. Residual extraaxial extra-axial fluid collection anterior to the left frontal lobe measuring up to 9 mm in thickness. Electronically signed by: Lonni Necessary MD 07/14/2024 05:26 PM EDT RP Workstation: [id number]    EKG: Independently reviewed.  EKG done on 07/01/2024 showed sinus bradycardia with QTc of 421 ms.  Assessment/Plan Principal Problem:   Subdural hematoma (HCC) Active Problems:   Alcohol withdrawal (HCC)   Bipolar disorder (HCC)   Essential hypertension    Subdural hematoma status post frontoparietal craniotomy and evacuation at [facility] hospital on 06/30/2024.  Transferred for further care including need for flap.  Further management per neurosurgery. Bipolar disorder with panic presently on lithium , Latuda  and Klonopin .  Check lithium  levels.  Patient has been having frequent episodes of panic and agitation.  Has required sedatives. Hypertension on amlodipine . History of alcohol abuse was on phenobarbital tapering dose at the [facility] hospital.  On thiamine .  Patient has a subclavian central line when transferred from the other facility.  Since patient has subdural hematoma status was frontoparietal craniotomy and evacuation and need further management and more than 2 midnight stay.  All lab works are pending.   DVT prophylaxis: CDs. Code Status: Full code. Family Communication: Discussed with patient. Disposition Plan: Monitored bed. Consults called: Neurosurgery.  Psychiatric. Admission status: Inpatient.

## 2024-07-16 NOTE — Progress Notes (Signed)
 RN called to room by sitter for patient trying to take off his restraints. Upon arrival to room, patient attempting to take off ankle restraints and stating, I'm going to shoot everyone as staff was trying to get him back in the wrist restraints. Sitter also made RN aware patient was making homicidal comments like that as well as stated I'm going to blow this place up. When patient was placed in wrist restraints he stated, Just kill me already. Dr Dino made aware and placed a psych consult.

## 2024-07-16 NOTE — Progress Notes (Signed)
 Patient is currently resting and calm.    Around 0730 this morning, patent was hollering for ativan  and pain medicine.  I administered both to patient.  Patient requested that he be taken out of the restraints.  I told him that I would allow him out of the wrist restraints to eat and use his urinal.  I told him that they could stay off only if he was cooperative and followed directions and I explained to him that he was in restraints for his own safety.  Patient agreed.  For approx 2 hours, patient did very well out of the wrist restraints.  He ate a snack and slept for about an hour.  However, when he woke suddenly, he was in a panic and began trying to remove his ankle restraints.  He was immediately placed back into his wrist restraints.  He expressed his displeasure about being in the wrist restraints again. I explained that I placed him back into the wrist restraints because he was trying to get out of the bed and he was therefore, putting himself at risk for injury. I asked him if he knew where he was and he answered no. I reoriented him and again explained why he was in restraints.  Patient requested to be taken out of his wrist restraints again, to which I explained that we had already tried that once and it didn't work out.  He promised me that he would cooperate and not try to get up.  I explained to the patient that we were not going to be going back and forth all day, in and out of wrist restraints, depending upon his mood and willingness to cooperate.  He agreed that he would cooperate and not try to get up.  Patient is currently out of wrist restraints, but has the ankle restraints still on.

## 2024-07-16 NOTE — Progress Notes (Signed)
 Patient placed back into wrist restraints around 1400 today.  He was screaming that he was going to shoot everyone and that he was going to get out of here and if we didn't let him leave, he would kill everyone.  Patient was given Ativan  as ordered and then he fell asleep for about an hour.  The NT gave patient a bed bath and patient slept for about 2 hours.  He woke up and started yelling again about getting out of the restraints.  When I entered the room, he told me that we were being cruel to him and that he was going to die tonight.  I asked him why he thought he was going to die, and he told me, People can just die, you know. Patient was given another dose of ordered Ativan , refused his dinner and fell asleep.

## 2024-07-16 NOTE — Progress Notes (Addendum)
 Transition of Care [facility]) - Inpatient Brief Assessment   Patient Details  Name: Ernest Romero MRN: [id number] Date of Birth: 1966/04/28  Transition of Care [facility]) CM/SW Contact:    Rosaline JONELLE Joe, RN Phone Number: 07/16/2024, 12:24 PM   Clinical Narrative: Patient transferred from [facility] to be seen by neurosurgery - S/P Craniotomy.  Per notes, patient has not family and is currently requiring 1:1 sitter and will need APS report files requesting guardianship since patient has no family and is unable to be his own guardian post surgery post-notes.  I spoke with Josefa Jes, MSW Supervisor and made her aware that patient would likely be DTP patient requiring placement/guardianship through APS.  I called and left a message with Adult APS requesting they call me back so I could file report requesting guardianship/hearing through APS.  07/16/1429 - Patient with threatening behaviors to staff per charge RN.  Patient will be IVC'ed - MSW is aware and will complete paperwork with MD.  Patient continuing to need safety sitter at the bedside.  Psychiatry was consulted by MD.   Transition of Care Asessment: Insurance and Status: (P) Insurance coverage has been reviewed Patient has primary care physician: (P) Yes Home environment has been reviewed: (P) patient found unresponsive in [place] Prior level of function:: (P) self Prior/Current Home Services: (P) No current home services Social Drivers of Health Review: (P) SDOH reviewed needs interventions Readmission risk has been reviewed: (P) Yes Transition of care needs: (P) transition of care needs identified, TOC will continue to follow

## 2024-07-17 ENCOUNTER — Inpatient Hospital Stay (HOSPITAL_COMMUNITY): Payer: MEDICAID

## 2024-07-17 DIAGNOSIS — S065XAA Traumatic subdural hemorrhage with loss of consciousness status unknown, initial encounter: Secondary | ICD-10-CM | POA: Diagnosis not present

## 2024-07-17 LAB — LITHIUM LEVEL: Lithium Lvl: 0.44 mmol/L — ABNORMAL LOW (ref 0.60–1.20)

## 2024-07-17 MED ORDER — HALOPERIDOL LACTATE 5 MG/ML IJ SOLN
5.0000 mg | Freq: Four times a day (QID) | INTRAMUSCULAR | Status: DC | PRN
  Administered 2024-07-17 – 2024-07-29 (×9): 5 mg via INTRAVENOUS
  Filled 2024-07-17 (×10): qty 1

## 2024-07-17 MED ORDER — HALOPERIDOL LACTATE 5 MG/ML IJ SOLN
10.0000 mg | Freq: Four times a day (QID) | INTRAMUSCULAR | Status: DC | PRN
  Administered 2024-07-18 – 2024-07-24 (×9): 10 mg via INTRAVENOUS
  Filled 2024-07-17 (×13): qty 2

## 2024-07-17 MED ORDER — HALOPERIDOL LACTATE 5 MG/ML IJ SOLN
10.0000 mg | Freq: Four times a day (QID) | INTRAMUSCULAR | Status: DC | PRN
  Administered 2024-07-19 – 2024-07-21 (×4): 10 mg via INTRAMUSCULAR
  Filled 2024-07-17 (×2): qty 2

## 2024-07-17 MED ORDER — HALOPERIDOL LACTATE 5 MG/ML IJ SOLN
5.0000 mg | Freq: Four times a day (QID) | INTRAMUSCULAR | Status: DC | PRN
  Filled 2024-07-17: qty 1

## 2024-07-17 NOTE — Progress Notes (Signed)
 Patient has continued to do well out of restraints post IV lorazepam . PRN medications adjusted by psych. Patient ambulated a second time around unit after lunch, approximately 200 feet again, with sitter and this RN, again requiring frequent cues to utilize front-wheel walker. Sitter reports patient needing frequent reorientatin in the room, but no aggression towards staff.   Patient was ordered CT scan by neurosurgery for upcoming cranioplasty. Patient asking about going outside. Again reoriented patient to hospital setting and additionally medicated with 5mg  IV haldol  as patient growing restless about leaving floor. This RN accompanied patient transport and sitter to CT Scan, no issues with patient agitation noted by this RN post IV haldol  administration. Patient awoke to eat a small amount of dinner on return to floor then went back to sleep. Lean-Cuisine meals available on unit if patient awakens and is hungry for night shift.

## 2024-07-17 NOTE — Progress Notes (Signed)
 Patient received in 4-point heavy-duty non-violent restraints due to aggression and pulling at lines and tubes. Initial checks as documented, WDL and applied correctly, skin intact. At 0930 checks, patient inconsolably tearful, repeating I'm sorry, I know you're the good guys and trying to help me. Sitter at bedside assisted with ordering breakfast. RIGHT arm restraint was released at 1000 to allow patient to self-feed breakfast without issues reported from sitter.   At 1030, patient was medicated with IV lorazepam  1mg  due to persistent tearfulness and inconsolability. On rerounding with intent to do CHG bath, patient calm, cooperative and agreeable to CHG bath, clothing change, and full linen change. Patient then requested to go outside. Patient reoriented to hospital and IVC status, patient requested to walk in hall. Soft helmet placed per OOB mobility orders, front wheel walker obtained, patient given mesh underwear and paper scrub clothing, and patient ambulated with this RN and bedside safety sitter on each side around 2W nursing unit, approximately 200 feet. Patient required frequent cues to utilize rolling walker appropriately (holding to his right side, holding up in air) and staff on both sides for safety due to IV lorazepam  administration. Tolerated ambulation fairly well.  On return to room, patient requesting his personal books for reading and a movie on TV. TV set, books given to patient with sitter watching for safety. As of 1130, patient is calm, cooperative, and redirectable. RESTRAINTS DISCONTINUED. Restraints left on bed for ease of immediate reapplication if patient becomes aggressive or pulls at lines again. Call bell within reach of patient, bed alarm set, environmental stimuli decreased. MD notified of restraint discontinuation per protocol.

## 2024-07-17 NOTE — Progress Notes (Signed)
 Safety Observation:  Continued

## 2024-07-17 NOTE — Progress Notes (Signed)
 Assessment : 58yo gentleman who is an inmate and has multiple behavioral health conditions who reportedly fell and hit his head. Pt was taken to [facility] and underwent a LEFT decompressive hemicraniectomy with evacuation of hematoma on 06/30/2024. The boneflap was left off. He recovered well and was transferred her for a cranioplasty with an artificial boneflap.  Plan : I spoke to him today and examined his incision which looks great. FU CT has sown re-expansion of his brain.  I explained to him that once the boneflap is ready, I will replace the flap. I went over the risk of brain damage resulting in paralysis and seizures among others. Most importantly I told him that there is a risk of infection, which would require removal of the flap and antibiotic treatment.  He voiced understanding. We have ordered the artifical flap. As soon as it is made, I will take him to surgery for reimplantation. I have conveyed this to the admitting team.   Social History   Socioeconomic History   Marital status: Widowed    Spouse name: Not on file   Number of children: Not on file   Years of education: Not on file   Highest education level: Not on file  Occupational History   Not on file  Tobacco Use   Smoking status: Former   Smokeless tobacco: Never  Vaping Use   Vaping status: Every Day   Substances: Nicotine, Flavoring  Substance and Sexual Activity   Alcohol use: Not Currently    Comment: Denies currently using   Drug use: Not Currently    Comment: Denies current use   Sexual activity: Never  Other Topics Concern   Not on file  Social History Narrative   Not on file   Social Drivers of Health   Financial Resource Strain: Not on file  Food Insecurity: Patient Unable To Answer (07/16/2024)   Hunger Vital Sign    Worried About Running Out of Food in the Last Year: Patient unable to answer    Ran Out of Food in the Last Year: Patient unable to answer  Transportation Needs: Patient Unable  To Answer (07/16/2024)   PRAPARE - Transportation    Lack of Transportation (Medical): Patient unable to answer    Lack of Transportation (Non-Medical): Patient unable to answer  Physical Activity: Not on file  Stress: Not on file  Social Connections: Not on file  Intimate Partner Violence: Patient Unable To Answer (07/16/2024)   Humiliation, Afraid, Rape, and Kick questionnaire    Fear of Current or Ex-Partner: Patient unable to answer    Emotionally Abused: Patient unable to answer    Physically Abused: Patient unable to answer    Sexually Abused: Patient unable to answer    History reviewed. No pertinent family history.  No Known Allergies  Past Medical History:  Diagnosis Date   Anxiety    Bipolar 1 disorder (HCC)    Depression    Hepatitis    Hypertension     Past Surgical History:  Procedure Laterality Date   CRANIOTOMY Left 06/30/2024   Procedure: CRANIOTOMY HEMATOMA EVACUATION SUBDURAL;  Surgeon: Melonie Grass, MD;  Location: [facility] ORS;  Service: Neurosurgery;  Laterality: Left;     Physical Exam HENT:     Head: Normocephalic.     Nose: Nose normal.  Eyes:     Pupils: Pupils are equal, round, and reactive to light.  Cardiovascular:     Rate and Rhythm: Normal rate.  Pulmonary:     Effort:  Pulmonary effort is normal.  Abdominal:     General: Abdomen is flat.  Musculoskeletal:     Cervical back: Normal range of motion.  Neurological:     General: No focal deficit present.     Mental Status: He is alert.     Cranial Nerves: Cranial nerves 2-12 are intact.     Sensory: Sensation is intact.     Motor: Motor function is intact.     Coordination: Coordination is intact.     Comments: Gait not tested        Results for orders placed or performed during the hospital encounter of 06/30/24  CT HEAD WO CONTRAST ( )   Narrative   EXAM: CT HEAD WITHOUT CONTRAST 07/14/2024 05:14:55 PM  TECHNIQUE: CT of the head was performed without the  administration of intravenous contrast. Automated exposure control, iterative reconstruction, and/or weight based adjustment of the mA/kV was utilized to reduce the radiation dose to as low as reasonably achievable.  COMPARISON: None available.  CLINICAL HISTORY: Headache in the setting of recent subdural hematoma.  FINDINGS:  BRAIN AND VENTRICLES: No new hemorrhage is present. No evidence of acute infarct. No hydrocephalus. The previously noted mass effect has near completely resolved. Residual extraaxial fluid anterior to the left frontal lobe measures up to 9 mm in thickness. Left frontoparietal craniectomy was again noted. The surgical drain was removed.  ORBITS: No acute abnormality.  SINUSES: No acute abnormality.  SOFT TISSUES AND SKULL: Skin staples are present. Left frontoparietal craniectomy was again noted.  IMPRESSION: 1. Status post left frontoparietal craniectomy with interval removal of surgical drain. No new hemorrhage. 2. Near complete resolution of previously noted mass effect. 3. Residual extraaxial extra-axial fluid collection anterior to the left frontal lobe measuring up to 9 mm in thickness.  Electronically signed by: Lonni Necessary MD 07/14/2024 05:26 PM EDT RP Workstation: [id number]

## 2024-07-17 NOTE — Plan of Care (Signed)

## 2024-07-17 NOTE — Progress Notes (Signed)
 PROGRESS NOTE    Ernest Romero  FMW:[id number] DOB: 05/30/1966 DOA: 07/15/2024 PCP: [name]   Brief Narrative:  58 y.o. male with history of alcohol abuse, bipolar with panic disorder who was admitted on June 30, 2024 at [facility] hospital after patient was found to be unresponsive in the jail.  CT of the head showed large subdural hematoma and patient underwent left frontoparietal craniotomy with evacuation of the subdural hematoma and placement of a drain at [facility] on 06/30/2024 which was eventually removed.  He was transferred from [facility] to [facility] for flap as per neurosurgery. Patient was on alcohol withdrawal protocol with phenobarbital which was tapered off. Patient is also on medications for his bipolar 1 panic disorder and also was noticed to be having frequent attacks of panic and agitation requiring multiple doses of sedation.   Assessment & Plan:   Principal Problem:   Subdural hematoma (HCC) Active Problems:   Alcohol withdrawal (HCC)   Bipolar disorder (HCC)   Essential hypertension  Subdural hematoma status post frontoparietal craniotomy indication at [facility] hospital on 06/22/2024: Transferred to [facility] for need for fly.  Management per neurosurgery.  Bipolar disorder with frequent panic attacks: Presently on lithium , Latuda  and Klonopin .  Was seen by psychiatry at [facility].  Psychiatry is consulted here.  Checking lithium  level.  Has been agitated at times and requiring discharge.  Essential hypertension: Blood pressure controlled.  Continue melatonin.  History of alcohol abuse: Was treated with tapering dose of phenobarbital at [facility].  Not withdrawing anymore.  DVT prophylaxis: SCDs Start: 07/16/24 0142   Code Status: Full Code  Family Communication: RN and sitter both present at bedside.  Status is: Inpatient Remains inpatient appropriate because: Needs to be evaluated by neurosurgery   Estimated body mass index is 21.35 kg/m as calculated  from the following:   Height as of this encounter: 5' 10 (1.778 m).   Weight as of this encounter: 67.5 kg.    Nutritional Assessment: Body mass index is 21.35 kg/m.SABRA Seen by dietician.  I agree with the assessment and plan as outlined below: Nutrition Status:        . Skin Assessment: I have examined the patient's skin and I agree with the wound assessment as performed by the wound care RN as outlined below:    Consultants:  Neurosurgery  Procedures:  As above  Antimicrobials:  Anti-infectives (From admission, onward)    None         Subjective: Patient seen and examined, he was in both wrist and ankle restraints, sitter and RN were both at the bedside.  Patient was fully alert and oriented, he knew why he is in the hospital.  Per information provided to me by the nurse in the center, patient becomes belligerent intermittently and becomes very dangerous and thus may not be safe to release him from restraints at the moment.  Psychiatry to see him as well.  Also, I did not notice any present guard at the bedside and I did inquire from staff, they also did not have any information as to why.  The nurse is going to do some research and talk to the leadership on the unit about that issue.  Objective: Vitals:   07/16/24 2100 07/16/24 2355 07/17/24 0420 07/17/24 0754  BP: (!) 133/99 120/88 (!) 145/98 119/86  Pulse: 69 76 64 (!) 58  Resp: 16 20 20 17   Temp: 98.5 F (36.9 C) 98.8 F (37.1 C) 97.9 F (36.6 C)  TempSrc: Oral Oral Oral Axillary  SpO2: 99% 99% 99% 96%  Weight:      Height:        Intake/Output Summary (Last 24 hours) at 07/17/2024 0832 Last data filed at 07/17/2024 0522 Gross per 24 hour  Intake 750 ml  Output 1625 ml  Net -875 ml   Filed Weights   07/16/24 0800  Weight: 67.5 kg    Examination:  General exam: Appears calm and comfortable  Respiratory system: Clear to auscultation. Respiratory effort normal. Cardiovascular system: S1 & S2  heard, RRR. No JVD, murmurs, rubs, gallops or clicks. No pedal edema. Gastrointestinal system: Abdomen is nondistended, soft and nontender. No organomegaly or masses felt. Normal bowel sounds heard. Central nervous system: Alert and oriented. No focal neurological deficits. Extremities: Symmetric 5 x 5 power. Skin: No rashes, lesions or ulcers  Data Reviewed: I have personally reviewed following labs and imaging studies  CBC: Recent Labs  Lab 07/14/24 1114 07/15/24 0454 07/16/24 0334  WBC 10.2 8.5 9.3  NEUTROABS  --   --  6.0  HGB 14.3 13.8 13.5  HCT 42.3 40.7 38.8*  MCV 88.3 87.7 86.0  PLT 464* 434* 438*   Basic Metabolic Panel: Recent Labs  Lab 07/14/24 1114 07/15/24 0454 07/16/24 0334  NA 140 140 136  K 4.1 4.1 3.9  CL 101 106 103  CO2 24 24 21*  GLUCOSE 100* 96 133*  BUN 16 27* 21*  CREATININE 0.98 0.78 1.04  CALCIUM 9.9 9.5 9.3  MG 2.2 2.5*  --    GFR: Estimated Creatinine Clearance: 73 mL/min (by C-G formula based on SCr of 1.04 mg/dL). Liver Function Tests: Recent Labs  Lab 07/16/24 0334  AST 48*  ALT 85*  ALKPHOS 97  BILITOT 0.5  PROT 6.8  ALBUMIN 3.4*   No results for input(s): LIPASE, AMYLASE in the last 168 hours. No results for input(s): AMMONIA in the last 168 hours. Coagulation Profile: No results for input(s): INR, PROTIME in the last 168 hours. Cardiac Enzymes: No results for input(s): CKTOTAL, CKMB, CKMBINDEX, TROPONINI in the last 168 hours. BNP (last 3 results) No results for input(s): PROBNP in the last 8760 hours. HbA1C: No results for input(s): HGBA1C in the last 72 hours. CBG: Recent Labs  Lab 07/11/24 1707 07/12/24 0734 07/12/24 1958 07/13/24 0818 07/15/24 1856  GLUCAP 108* 103* 108* 100* 124*   Lipid Profile: No results for input(s): CHOL, HDL, LDLCALC, TRIG, CHOLHDL, LDLDIRECT in the last 72 hours. Thyroid Function Tests: No results for input(s): TSH, T4TOTAL, FREET4, T3FREE,  THYROIDAB in the last 72 hours. Anemia Panel: No results for input(s): VITAMINB12, FOLATE, FERRITIN, TIBC, IRON, RETICCTPCT in the last 72 hours. Sepsis Labs: No results for input(s): PROCALCITON, LATICACIDVEN in the last 168 hours.  No results found for this or any previous visit (from the past 240 hours).   Radiology Studies: No results found.  Scheduled Meds:  amLODipine   5 mg Oral Daily   Chlorhexidine Gluconate Cloth  6 each Topical Daily   clonazePAM   1 mg Oral BID   folic acid   1 mg Oral Daily   lithium  carbonate  450 mg Oral QHS   lurasidone   40 mg Oral Q breakfast   sodium chloride  flush  10-40 mL Intracatheter Q12H   thiamine   100 mg Oral Daily   Continuous Infusions:   LOS: 2 days   Fredia Skeeter, MD [facility]  07/17/2024, 8:32 AM   *Please note that this is a verbal dictation therefore  any spelling or grammatical errors are due to the [employer] system interpretation.  Please page via Amion and do not message via secure chat for urgent patient care matters. Secure chat can be used for non urgent patient care matters.  How to contact the [facility] Attending or Consulting provider 7A - 7P or covering provider during after hours 7P -7A, for this patient?  Check the care team in [facility] and look for a) attending/consulting [facility] provider listed and b) the [facility] team listed. Page or secure chat 7A-7P. Log into [email or web address] and use [employer] universal password to access. If you do not have the password, please contact the hospital operator. Locate the [facility] provider you are looking for under [facility] and page to a number that you can be directly reached. If you still have difficulty reaching the provider, please page the [facility] (Director on Call) for the Hospitalists listed on amion for assistance.

## 2024-07-17 NOTE — Addendum Note (Signed)
 Addended by: Jone Panebianco on: 07/17/2024 06:33 PM   Modules accepted: Orders

## 2024-07-17 NOTE — Progress Notes (Signed)
 Continued

## 2024-07-17 NOTE — Telephone Encounter (Signed)
 CT brainlab is complete. I have spoken with Renda in CT at [facility], who will burn the images to a CD. I have notified Joeann that a new CD will be available for pickup at the [facility] radiology desk tomorrow morning.

## 2024-07-17 NOTE — Telephone Encounter (Addendum)
 I received a call from [place] this morning. He informed me that the scan from 07/14/24 has too much patient motion. I spoke with Kurt in the [facility] CT department and he is going to edit the scan from 06/30/24 to have 1mm slices and burn it to a CD for [facility] to pick up. [name], Dr Clois, and Dr [name] have been updated.

## 2024-07-17 NOTE — Plan of Care (Signed)
  Problem: Nutrition: Goal: Adequate nutrition will be maintained Outcome: Progressing   Problem: Pain Managment: Goal: General experience of comfort will improve and/or be controlled Outcome: Progressing   Problem: Education: Goal: Knowledge of General Education information will improve Description: Including pain rating scale, medication(s)/side effects and non-pharmacologic comfort measures Outcome: Not Progressing   Problem: Coping: Goal: Level of anxiety will decrease Outcome: Not Progressing

## 2024-07-17 NOTE — Consult Note (Signed)
 [facility] Psychiatric Consult Initial  Patient Name: .Ernest Romero  MRN: [id number]  DOB: 07/27/1966  Consult Order details:  Orders (From admission, onward)     Start     Ordered   07/16/24 1412  IP CONSULT TO PSYCHIATRY       Ordering Provider: Dino Antu, MD  Provider:  (Not yet assigned)  Question Answer Comment  Location [facility]   Reason for Consult? Homicidal/suicidal comments, bipolar disorder      07/16/24 1412            Mode of Visit: In person   Psychiatry Consult Evaluation  Service Date: July 17, 2024 LOS:  LOS: 2 days  Chief Complaint Sleepy  Primary Psychiatric Diagnoses  Delirium 2.  Substance use disorder 3.  Bipolar disorder  Assessment  Ernest Romero is a 58 y.o. male admitted: Medically for 07/15/2024 11:15 PM for further management of craniotomy. He carries the psychiatric diagnoses of bipolar disorder, panic disorder, AUD and has a past medical history of essential hypertension.   His current presentation of fluctuating mental status is most consistent with course of delirium. Current outpatient psychotropic medications include lithium , Latuda , Klonopin  and historically he has had a good response to these medications. He was noncompliant with at least his lithium  prior to admission as evidenced by subtherapeutic lithium  range 2 weeks ago. On initial examination, patient was oriented to person, time and situation while exhibiting significant mood lability.  Patient has had recent ileotomy, completed phenobarbital taper for alcohol withdrawal, and has been receiving as needed Ativan  for agitation likely contributing to her worsening patient's delirium.  We will optimize this patient's agitation protocol, continue outpatient psychotropic medication regimen, and defer substance use counseling at this time given his other comorbidities that should be prioritized.  Please see plan below for detailed recommendations.   Diagnoses:   Active Hospital problems: Principal Problem:   Subdural hematoma (HCC) Active Problems:   Bipolar disorder (HCC)   Essential hypertension   Alcohol withdrawal (HCC)   Plan   ## Psychiatric Medication Recommendations:  -Adjust agitation protocol: Discontinue Ativan  1 mg every 4 hours and IM Geodon  every 6 hours as needed  -Add Haldol  IM/IV 5 mg every 6 hours as needed moderate agitation, Tylenol  IM/IV 10 mg every 6 hours as needed for severe agitation - Continue lithium  450mg  at night - Continue Latuda  40 mg daily with breakfast - Continue Klonopin  1 mg 2 times daily (although do not endorses regimen, will not discontinue due to risk of worsening delirium related agitation/anxiety) - Continue thiamine  100 mg daily and folic acid  1mg  daily - Added delirium precautions  ## Medical Decision Making Capacity: Not specifically addressed in this encounter  ## Further Work-up:  -- most recent EKG on 07/01/2024 had QTc of 421 -- Pertinent labwork reviewed earlier this admission includes: Lithium  level collected 07/17/2024 of 0.44  ## Disposition:-- There are no psychiatric contraindications to discharge at this time  ## Behavioral / Environmental: -Delirium Precautions: Delirium Interventions for Nursing and Staff: - RN to open blinds every AM. - To Bedside: Glasses, hearing aide, and pt's own shoes. Make available to patients. when possible and encourage use. - Encourage po fluids when appropriate, keep fluids within reach. - OOB to chair with meals. - Passive ROM exercises to all extremities with AM & PM care. - RN to assess orientation to person, time and place QAM and PRN. - Recommend extended visitation hours with familiar family/friends as feasible. - Staff  to minimize disturbances at night. Turn off television when pt asleep or when not in use.   ## Safety and Observation Level:  - Based on my clinical evaluation, I estimate the patient to be at low risk of self harm in the current  setting. - At this time, we recommend  1:1 Observation. This decision is based on my review of the chart including patient's history and current presentation, interview of the patient, mental status examination, and consideration of suicide risk including evaluating suicidal ideation, plan, intent, suicidal or self-harm behaviors, risk factors, and protective factors. This judgment is based on our ability to directly address suicide risk, implement suicide prevention strategies, and develop a safety plan while the patient is in the clinical setting. Please contact our team if there is a concern that risk level has changed.  CSSR Risk Category:C-SSRS RISK CATEGORY: No Risk  Suicide Risk Assessment: Patient has following modifiable risk factors for suicide: lack of access to outpatient mental health resources, current symptoms: anxiety/panic, insomnia, impulsivity, anhedonia, hopelessness, and pain, medical illness (ie new dx of cancer), which we are addressing by optimizing psychotropic medications. Patient has following non-modifiable or demographic risk factors for suicide: male gender and psychiatric hospitalization  Thank you for this consult request. Recommendations have been communicated to the primary team.  We will continue to follow at this time.   [name], DO  History of Present Illness  Relevant Aspects of [facility] Course:  Admitted on 07/15/2024 for the management of craniotomy & flap secondary to subdural hematoma.   Patient Report:  Patient believes that he is currently in [facility] receiving care for something going on with his head.  He is oriented to person time and situation.  He exhibits labile mood, frequently becoming tearful during our interview.  We discussed his past psychiatric history of panic disorder and bipolar disorder.  Patient says his last manic episode was a few days ago when he was in the hospital, he says that he was tied up, hungry, and the  nurses were not serving his food correctly.  We discussed that this was likely not a manic episode secondary to his bipolar disorder, but was likely some agitation secondary to delirium from being in the hospital.  Patient says his sleep has not been regular, and he has been waking up occasionally during the night.  Patient says his appetite has been about the same, and he has noticed decreased energy since his hospitalization.  I asked how he feels his mental health is currently, the patient says half and half.  I asked what this means, and he says that he thinks it could be better.  I asked what would help him make his mental health better, and patient says more Klonopin  would help keep him on an even keel.  Following this, patient said that he was sleepy and no longer wanted to continue with the interview.  He denies all SI/HI/AVH at this time.  Psych ROS:  Anxiety: Yes Mania (lifetime and current): Yes, endorses last episode was a few days ago, however probably Psychosis: (lifetime and current): Denies  Psychiatric and Social History  Psychiatric History:  Information collected from chart review and patient  Prev Dx/Sx: GAD, panic disorder, bipolar disorder Current Psych Provider: Dr. Beverley Home Meds (current): Lithium  100 mg daily, Latuda  40 mg daily, Klonopin  1 mg twice daily Previous Med Trials: Suboxone   Prior Psych Hospitalization: yes  Prior Self Harm: none Family Psych History: none Family Hx suicide:  none  Social History:  Developmental Hx: Unknown Educational Hx: Unknown Occupational Hx: Disabled Legal Hx: came from jail Living Situation: jail Access to weapons/lethal means: Unknown  Substance History Alcohol: AUD History of alcohol withdrawal seizures: unknown History of DT's unknown Tobacco: Former Illicit drugs: Meth, crack, heroin, last use 1 month ago  Exam Findings  Physical Exam:  Vital Signs:  Temp:  [97.7 F (36.5 C)-98.8 F (37.1 C)] 97.9 F (36.6 C)  (10/15 0420) Pulse Rate:  [58-76] 58 (10/15 0754) Resp:  [16-20] 17 (10/15 0754) BP: (113-145)/(86-101) 119/86 (10/15 0754) SpO2:  [96 %-99 %] 96 % (10/15 0754) Blood pressure 119/86, pulse (!) 58, temperature 97.9 F (36.6 C), temperature source Oral, resp. rate 17, height 5' 10 (1.778 m), weight 67.5 kg, SpO2 96%. Body mass index is 21.35 kg/m.  Physical Exam  Mental Status Exam: General Appearance: Casual  Orientation:  Other:  Oriented to person, time and situation  Memory:  Recent;   Fair  Concentration:  Concentration: Fair  Recall:  Fair  Attention  Fair  Eye Contact:  Minimal  Speech:  Garbled and Normal Rate  Language:  Fair  Volume:  Normal  Mood: Labile  Affect:  Labile and Tearful  Thought Process:  Coherent and Linear  Thought Content:  Logical  Suicidal Thoughts:  No  Homicidal Thoughts:  No  Judgement:  Poor  Insight:  Lacking  Psychomotor Activity:  Increased  Akathisia:  DNA  Fund of Knowledge:  Fair      Assets:  Manufacturing systems engineer Leisure Time Resilience  Cognition:  Impaired,  Mild  ADL's:  Intact  AIMS (if indicated):        Other History   These have been pulled in through the EMR, reviewed, and updated if appropriate.  Family History:  The patient's family history is not on file.  Medical History: Past Medical History:  Diagnosis Date   Anxiety    Bipolar 1 disorder (HCC)    Depression    Hepatitis    Hypertension    Surgical History: Past Surgical History:  Procedure Laterality Date   CRANIOTOMY Left 06/30/2024   Procedure: CRANIOTOMY HEMATOMA EVACUATION SUBDURAL;  Surgeon: Melonie Grass, MD;  Location: [facility] ORS;  Service: Neurosurgery;  Laterality: Left;   Medications:   Current Facility-Administered Medications:    acetaminophen  (TYLENOL ) tablet 650 mg, 650 mg, Oral, Q6H PRN, 650 mg at 07/17/24 0604 **OR** acetaminophen  (TYLENOL ) suppository 650 mg, 650 mg, Rectal, Q6H PRN, Franky Redia SAILOR, MD   amLODipine   (NORVASC ) tablet 5 mg, 5 mg, Oral, Daily, Kakrakandy, Arshad N, MD, 5 mg at 07/16/24 1024   Chlorhexidine Gluconate Cloth 2 % PADS 6 each, 6 each, Topical, Daily, Kakrakandy, Arshad N, MD, 6 each at 07/16/24 1025   clonazePAM  (KLONOPIN ) tablet 1 mg, 1 mg, Oral, BID, Franky Redia SAILOR, MD, 1 mg at 07/16/24 2114   folic acid  (FOLVITE ) tablet 1 mg, 1 mg, Oral, Daily, Kakrakandy, Arshad N, MD, 1 mg at 07/16/24 1025   lithium  carbonate (ESKALITH ) ER tablet 450 mg, 450 mg, Oral, QHS, Franky Redia SAILOR, MD, 450 mg at 07/16/24 2114   LORazepam  (ATIVAN ) injection 1 mg, 1 mg, Intravenous, Q4H PRN, Franky Redia SAILOR, MD, 1 mg at 07/16/24 2330   lurasidone  (LATUDA ) tablet 40 mg, 40 mg, Oral, Q breakfast, Franky Redia SAILOR, MD, 40 mg at 07/17/24 0604   sodium chloride  flush (NS) 0.9 % injection 10-40 mL, 10-40 mL, Intracatheter, Q12H, Franky Redia SAILOR, MD, 10 mL at 07/16/24  2109   sodium chloride  flush (NS) 0.9 % injection 10-40 mL, 10-40 mL, Intracatheter, PRN, Franky Redia SAILOR, MD   thiamine  (VITAMIN B1) tablet 100 mg, 100 mg, Oral, Daily, Franky Redia SAILOR, MD, 100 mg at 07/16/24 1024   ziprasidone  (GEODON ) injection 10 mg, 10 mg, Intramuscular, Q6H PRN, Segars, Jonathan, MD  Allergies: No Known Allergies  [name], DO

## 2024-07-17 NOTE — Telephone Encounter (Signed)
 Received another call from [facility]. The CT from 9/28 has a gantry tilt of 9 degrees and it needs to be zero. Unfortunately, we need to order a new CT. Howell recommended we order a CT brain lab (for thinner slices). I have discussed the above with Dr Clois, Dr [name], and [name]. Dr Janjua has placed the order and communicated with the inpatient provider.

## 2024-07-17 NOTE — Telephone Encounter (Signed)
 Canceled CT order. Repeat CT done 07/17/24. Instead of following up with Dr [name], the patient will now have their cranioplasty with Dr Janjua.

## 2024-07-18 DIAGNOSIS — S065XAA Traumatic subdural hemorrhage with loss of consciousness status unknown, initial encounter: Secondary | ICD-10-CM | POA: Diagnosis not present

## 2024-07-18 NOTE — Plan of Care (Signed)
  Problem: Activity: Goal: Risk for activity intolerance will decrease Outcome: Progressing   Problem: Pain Managment: Goal: General experience of comfort will improve and/or be controlled Outcome: Progressing   Problem: Health Behavior/Discharge Planning: Goal: Ability to manage health-related needs will improve Outcome: Not Progressing

## 2024-07-18 NOTE — Plan of Care (Signed)

## 2024-07-18 NOTE — Consult Note (Signed)
 [facility] Psychiatric Consult Initial  Patient Name: .Ernest Romero  MRN: [id number]  DOB: 1966/08/25  Consult Order details:  Orders (From admission, onward)     Start     Ordered   07/16/24 1412  IP CONSULT TO PSYCHIATRY       Ordering Provider: Dino Antu, MD  Provider:  (Not yet assigned)  Question Answer Comment  Location [facility]   Reason for Consult? Homicidal/suicidal comments, bipolar disorder      07/16/24 1412            Mode of Visit: In person   Psychiatry Consult Evaluation  Service Date: July 18, 2024 LOS:  LOS: 3 days  Chief Complaint Sleepy  Primary Psychiatric Diagnoses  Delirium 2.  Substance use disorder 3.  Bipolar disorder  Assessment  Ernest Romero is a 58 y.o. male admitted: Medically for 07/15/2024 11:15 PM for further management of craniotomy. He carries the psychiatric diagnoses of bipolar disorder, panic disorder, AUD and has a past medical history of essential hypertension.   His current presentation of fluctuating mental status is most consistent with course of delirium. Current outpatient psychotropic medications include lithium , Latuda , Klonopin  and historically he has had a good response to these medications. He was noncompliant with at least his lithium  prior to admission as evidenced by subtherapeutic lithium  range 2 weeks ago. On initial examination, patient was oriented to person, time and situation while exhibiting significant mood lability.  Patient has had recent ileotomy, completed phenobarbital taper for alcohol withdrawal, and has been receiving as needed Ativan  for agitation likely contributing to her worsening patient's delirium.  We will optimize this patient's agitation protocol, continue outpatient psychotropic medication regimen, and defer substance use counseling at this time given his other comorbidities that should be prioritized.  Please see plan below for detailed recommendations.   Diagnoses:   Active Hospital problems: Principal Problem:   Subdural hematoma (HCC) Active Problems:   Bipolar disorder (HCC)   Essential hypertension   Alcohol withdrawal (HCC)   Plan   ## Psychiatric Medication Recommendations:  -Adjust agitation protocol: Discontinue Ativan  1 mg every 4 hours and IM Geodon  every 6 hours as needed  -Add Haldol  IM/IV 5 mg every 6 hours as needed moderate agitation, Tylenol  IM/IV 10 mg every 6 hours as needed for severe agitation - Continue lithium  450mg  at night - Continue Latuda  40 mg daily with breakfast - Continue Klonopin  1 mg 2 times daily (although do not endorses regimen, will not discontinue due to risk of worsening delirium related agitation/anxiety) - Continue thiamine  100 mg daily and folic acid  1mg  daily - Added delirium precautions  ## Medical Decision Making Capacity: Not specifically addressed in this encounter  ## Further Work-up:  -- most recent EKG on 07/01/2024 had QTc of 421 -- Pertinent labwork reviewed earlier this admission includes: Lithium  level collected 07/17/2024 of 0.44  ## Disposition:-- There are no psychiatric contraindications to discharge at this time  ## Behavioral / Environmental: -Delirium Precautions: Delirium Interventions for Nursing and Staff: - RN to open blinds every AM. - To Bedside: Glasses, hearing aide, and pt's own shoes. Make available to patients. when possible and encourage use. - Encourage po fluids when appropriate, keep fluids within reach. - OOB to chair with meals. - Passive ROM exercises to all extremities with AM & PM care. - RN to assess orientation to person, time and place QAM and PRN. - Recommend extended visitation hours with familiar family/friends as feasible. - Staff  to minimize disturbances at night. Turn off television when pt asleep or when not in use.   ## Safety and Observation Level:  - Based on my clinical evaluation, I estimate the patient to be at low risk of self harm in the current  setting. - At this time, we recommend  1:1 Observation. This decision is based on my review of the chart including patient's history and current presentation, interview of the patient, mental status examination, and consideration of suicide risk including evaluating suicidal ideation, plan, intent, suicidal or self-harm behaviors, risk factors, and protective factors. This judgment is based on our ability to directly address suicide risk, implement suicide prevention strategies, and develop a safety plan while the patient is in the clinical setting. Please contact our team if there is a concern that risk level has changed.  CSSR Risk Category:C-SSRS RISK CATEGORY: No Risk  Suicide Risk Assessment: Patient has following modifiable risk factors for suicide: lack of access to outpatient mental health resources, current symptoms: anxiety/panic, insomnia, impulsivity, anhedonia, hopelessness, and pain, medical illness (ie new dx of cancer), which we are addressing by optimizing psychotropic medications. Patient has following non-modifiable or demographic risk factors for suicide: male gender and psychiatric hospitalization  Thank you for this consult request. Recommendations have been communicated to the primary team.  We will sign off at this time.   Ernest Light, DO  History of Present Illness  Relevant Aspects of [facility] Course:  Admitted on 07/15/2024 for the management of craniotomy & flap secondary to subdural hematoma.   Patient Report:  07/17/2024: Patient believes that he is currently in [facility] receiving care for something going on with his head.  He is oriented to person time and situation.  He exhibits labile mood, frequently becoming tearful during our interview.  We discussed his past psychiatric history of panic disorder and bipolar disorder.  Patient says his last manic episode was a few days ago when he was in the hospital, he says that he was tied up, hungry, and  the nurses were not serving his food correctly.  We discussed that this was likely not a manic episode secondary to his bipolar disorder, but was likely some agitation secondary to delirium from being in the hospital.  Patient says his sleep has not been regular, and he has been waking up occasionally during the night.  Patient says his appetite has been about the same, and he has noticed decreased energy since his hospitalization.  I asked how he feels his mental health is currently, the patient says half and half.  I asked what this means, and he says that he thinks it could be better.  I asked what would help him make his mental health better, and patient says more Klonopin  would help keep him on an even keel.  Following this, patient said that he was sleepy and no longer wanted to continue with the interview.  He denies all SI/HI/AVH at this time.  07/18/2024: Patient is resting in bed, having breakfast. He thinks he slept good although he cannot say for sure because he does not remember.  He says that he is very frustrated because he continues to spill stuff on his person whenever he is eating or drinking.  He is oriented to person and time and situation but still believes that he is in [facility].  Patient was reoriented and, and has no other concerns at this time.  Per new agitation protocol, patient received IV Haldol  5 mg last  night.  Spoke with nursing, and they said this was given because patient was wanting to leave the hospital to smoke cigarette, and to help with his agitation before repeat head CT last night.  Patient responded well to this medication, and has been successfully trialed out of restraints.  Psych ROS:  Anxiety: Yes Mania (lifetime and current): Yes, lifetime history of mania Psychosis: (lifetime and current): Denies  Psychiatric and Social History  Psychiatric History:  Information collected from chart review and patient  Prev Dx/Sx: GAD, panic disorder,  bipolar disorder Current Psych Provider: Dr. Beverley Home Meds (current): Lithium  100 mg daily, Latuda  40 mg daily, Klonopin  1 mg twice daily Previous Med Trials: Suboxone   Prior Psych Hospitalization: yes  Prior Self Harm: none Family Psych History: none Family Hx suicide: none  Social History:  Developmental Hx: Unknown Educational Hx: Unknown Occupational Hx: Disabled Legal Hx: came from jail Living Situation: jail Access to weapons/lethal means: Unknown  Substance History Alcohol: AUD History of alcohol withdrawal seizures: unknown History of DT's unknown Tobacco: Former Illicit drugs: Meth, crack, heroin, last use 1 month ago  Exam Findings  Physical Exam:  Vital Signs:  Temp:  [97.7 F (36.5 C)-98.5 F (36.9 C)] 97.7 F (36.5 C) (10/16 0408) Pulse Rate:  [52-74] 52 (10/16 0408) Resp:  [15-18] 18 (10/16 0408) BP: (118-124)/(83-92) 124/87 (10/16 0408) SpO2:  [96 %-99 %] 99 % (10/16 0408) Blood pressure 124/87, pulse (!) 52, temperature 97.7 F (36.5 C), resp. rate 18, height 5' 10 (1.778 m), weight 67.5 kg, SpO2 99%. Body mass index is 21.35 kg/m.  Physical Exam  Mental Status Exam: General Appearance: Casual  Orientation:  Other:  Oriented to person, time and situation  Memory:  Recent;   Fair  Concentration:  Concentration: Fair  Recall:  Fair  Attention  Fair  Eye Contact:  Minimal  Speech:  Garbled and Normal Rate  Language:  Fair  Volume:  Normal  Mood: fine  Affect: Restricted  Thought Process:  Coherent and Linear  Thought Content:  Logical  Suicidal Thoughts:  No  Homicidal Thoughts:  No  Judgement:  Poor  Insight:  Lacking  Psychomotor Activity: Normal  Akathisia:  DNA  Fund of Knowledge:  Fair      Assets:  Manufacturing systems engineer Leisure Time Resilience  Cognition:  Impaired,  Mild  ADL's: Impaired  AIMS (if indicated):        Other History   These have been pulled in through the EMR, reviewed, and updated if appropriate.  Family  History:  The patient's family history is not on file.  Medical History: Past Medical History:  Diagnosis Date   Anxiety    Bipolar 1 disorder (HCC)    Depression    Hepatitis    Hypertension    Surgical History: Past Surgical History:  Procedure Laterality Date   CRANIOTOMY Left 06/30/2024   Procedure: CRANIOTOMY HEMATOMA EVACUATION SUBDURAL;  Surgeon: Melonie Grass, MD;  Location: [facility] ORS;  Service: Neurosurgery;  Laterality: Left;   Medications:   Current Facility-Administered Medications:    acetaminophen  (TYLENOL ) tablet 650 mg, 650 mg, Oral, Q6H PRN, 650 mg at 07/17/24 2221 **OR** acetaminophen  (TYLENOL ) suppository 650 mg, 650 mg, Rectal, Q6H PRN, Franky Redia SAILOR, MD   amLODipine  (NORVASC ) tablet 5 mg, 5 mg, Oral, Daily, Franky Redia SAILOR, MD, 5 mg at 07/17/24 9074   Chlorhexidine Gluconate Cloth 2 % PADS 6 each, 6 each, Topical, Daily, Franky Redia SAILOR, MD, 6 each at 07/17/24 1050  clonazePAM  (KLONOPIN ) tablet 1 mg, 1 mg, Oral, BID, Kakrakandy, Arshad N, MD, 1 mg at 07/17/24 2221   folic acid  (FOLVITE ) tablet 1 mg, 1 mg, Oral, Daily, Franky Redia SAILOR, MD, 1 mg at 07/17/24 9074   haloperidol  lactate (HALDOL ) injection 10 mg, 10 mg, Intramuscular, Q6H PRN **OR** haloperidol  lactate (HALDOL ) injection 10 mg, 10 mg, Intravenous, Q6H PRN, Huxley Shurley, DO   haloperidol  lactate (HALDOL ) injection 5 mg, 5 mg, Intramuscular, Q6H PRN **OR** haloperidol  lactate (HALDOL ) injection 5 mg, 5 mg, Intravenous, Q6H PRN, Liyah Higham, DO, 5 mg at 07/17/24 1633   lithium  carbonate (ESKALITH ) ER tablet 450 mg, 450 mg, Oral, QHS, Franky Redia SAILOR, MD, 450 mg at 07/17/24 2222   lurasidone  (LATUDA ) tablet 40 mg, 40 mg, Oral, Q breakfast, Franky Redia SAILOR, MD, 40 mg at 07/18/24 0739   sodium chloride  flush (NS) 0.9 % injection 10-40 mL, 10-40 mL, Intracatheter, Q12H, Franky Redia SAILOR, MD, 10 mL at 07/17/24 2223   sodium chloride  flush (NS) 0.9 % injection 10-40  mL, 10-40 mL, Intracatheter, PRN, Franky Redia SAILOR, MD   thiamine  (VITAMIN B1) tablet 100 mg, 100 mg, Oral, Daily, Franky Redia SAILOR, MD, 100 mg at 07/17/24 9074  Allergies: No Known Allergies  Kioni Stahl, DO

## 2024-07-18 NOTE — TOC Progression Note (Signed)
 Transition of Care [facility]) - Progression Note    Patient Details  Name: Ernest Romero MRN: [id number] Date of Birth: 1966/03/01  Transition of Care [facility]) CM/SW Contact  Rosaline JONELLE Joe, RN Phone Number: 07/18/2024, 8:52 AM  Clinical Narrative:    CM noted that patient has pending cranioplasty.  I spoke with [facility] yesterday and HILLARY Holland, CM with [facility] states that [facility] has an active contract with [facility] and there is a possibility that when patient is stable that he might be a potential candidate for rehabilitation at the facility post-surgery.  IP Care management team will continue to follow the patient as he progresses.                      Expected Discharge Plan and Services                                               Social Drivers of Health (SDOH) Interventions SDOH Screenings   Food Insecurity: Patient Unable To Answer (07/16/2024)  Housing: Unknown (07/16/2024)  Transportation Needs: Patient Unable To Answer (07/16/2024)  Utilities: Patient Unable To Answer (07/16/2024)  Alcohol Screen: Low Risk  (08/30/2022)  Depression (PHQ2-9): Medium Risk (08/30/2022)  Tobacco Use: Medium Risk (07/16/2024)    Readmission Risk Interventions    07/16/2024   12:23 PM  Readmission Risk Prevention Plan  Transportation Screening Complete  Medication Review (RN Care Manager) Complete  PCP or Specialist appointment within 3-5 days of discharge Complete  HRI or Home Care Consult Complete  SW Recovery Care/Counseling Consult Complete  Palliative Care Screening Not Applicable  Skilled Nursing Facility Not Applicable

## 2024-07-18 NOTE — Progress Notes (Signed)
 PROGRESS NOTE    Ernest Romero  FMW:[id number] DOB: 1965/12/31 DOA: 07/15/2024 PCP: [name]   Brief Narrative:  58 y.o. male with history of alcohol abuse, bipolar with panic disorder who was admitted on June 30, 2024 at [facility] hospital after patient was found to be unresponsive in the jail.  CT of the head showed large subdural hematoma and patient underwent left frontoparietal craniotomy with evacuation of the subdural hematoma and placement of a drain at [facility] on 06/30/2024 which was eventually removed.  He was transferred from [facility] to [facility] for flap as per neurosurgery. Patient was on alcohol withdrawal protocol with phenobarbital which was tapered off. Patient is also on medications for his bipolar 1 panic disorder and also was noticed to be having frequent attacks of panic and agitation requiring multiple doses of sedation.   Assessment & Plan:   Principal Problem:   Subdural hematoma (HCC) Active Problems:   Alcohol withdrawal (HCC)   Bipolar disorder (HCC)   Essential hypertension  Subdural hematoma status post frontoparietal craniotomy indication at [facility] hospital on 06/22/2024: Transferred to [facility] for need for fly.  Seen by neurosurgery/Dr. Rosslyn, bone flap is ordered, per neurosurgery, plan to take him to the OR once that arrives.  Bipolar disorder with frequent panic attacks: Presently on lithium , Latuda  and Klonopin .  Was seen by psychiatry at [facility].  [place] some level which is<0.44. psychiatry is following here, management per them.  Appreciate their help.  Essential hypertension: Blood pressure controlled.  Continue melatonin.  History of alcohol abuse: Was treated with tapering dose of phenobarbital at [facility].  Not withdrawing anymore.  DVT prophylaxis: SCDs Start: 07/16/24 0142   Code Status: Full Code  Family Communication: RN and sitter both present at bedside.  Status is: Inpatient Remains inpatient appropriate because: Needs to  be evaluated by neurosurgery   Estimated body mass index is 21.35 kg/m as calculated from the following:   Height as of this encounter: 5' 10 (1.778 m).   Weight as of this encounter: 67.5 kg.    Nutritional Assessment: Body mass index is 21.35 kg/m.SABRA Seen by dietician.  I agree with the assessment and plan as outlined below: Nutrition Status:        . Skin Assessment: I have examined the patient's skin and I agree with the wound assessment as performed by the wound care RN as outlined below:    Consultants:  Neurosurgery  Procedures:  As above  Antimicrobials:  Anti-infectives (From admission, onward)    None         Subjective: Patient seen and examined.  Sitter at the bedside.  Patient has no complaints.  Patient's restraints were removed around noon yesterday and per reports, he has been behaving since then.    Objective: Vitals:   07/17/24 1635 07/17/24 1933 07/17/24 2339 07/18/24 0408  BP: (!) 123/92 120/84 118/83 124/87  Pulse: 71 (!) 57 74 (!) 52  Resp: 15 18 18 18   Temp: 97.7 F (36.5 C) 98.5 F (36.9 C) 97.9 F (36.6 C) 97.7 F (36.5 C)  TempSrc: Oral     SpO2: 98% 98% 99% 99%  Weight:      Height:        Intake/Output Summary (Last 24 hours) at 07/18/2024 0840 Last data filed at 07/18/2024 0630 Gross per 24 hour  Intake 420 ml  Output 1850 ml  Net -1430 ml   Filed Weights   07/16/24 0800  Weight: 67.5 kg  Examination:  General exam: Appears calm and comfortable  Respiratory system: Clear to auscultation. Respiratory effort normal. Cardiovascular system: S1 & S2 heard, RRR. No JVD, murmurs, rubs, gallops or clicks. No pedal edema. Gastrointestinal system: Abdomen is nondistended, soft and nontender. No organomegaly or masses felt. Normal bowel sounds heard. Central nervous system: Alert and oriented. No focal neurological deficits. Extremities: Symmetric 5 x 5 power. Skin: No rashes, lesions or ulcers.   Data Reviewed: I  have personally reviewed following labs and imaging studies  CBC: Recent Labs  Lab 07/14/24 1114 07/15/24 0454 07/16/24 0334  WBC 10.2 8.5 9.3  NEUTROABS  --   --  6.0  HGB 14.3 13.8 13.5  HCT 42.3 40.7 38.8*  MCV 88.3 87.7 86.0  PLT 464* 434* 438*   Basic Metabolic Panel: Recent Labs  Lab 07/14/24 1114 07/15/24 0454 07/16/24 0334  NA 140 140 136  K 4.1 4.1 3.9  CL 101 106 103  CO2 24 24 21*  GLUCOSE 100* 96 133*  BUN 16 27* 21*  CREATININE 0.98 0.78 1.04  CALCIUM 9.9 9.5 9.3  MG 2.2 2.5*  --    GFR: Estimated Creatinine Clearance: 73 mL/min (by C-G formula based on SCr of 1.04 mg/dL). Liver Function Tests: Recent Labs  Lab 07/16/24 0334  AST 48*  ALT 85*  ALKPHOS 97  BILITOT 0.5  PROT 6.8  ALBUMIN 3.4*   No results for input(s): LIPASE, AMYLASE in the last 168 hours. No results for input(s): AMMONIA in the last 168 hours. Coagulation Profile: No results for input(s): INR, PROTIME in the last 168 hours. Cardiac Enzymes: No results for input(s): CKTOTAL, CKMB, CKMBINDEX, TROPONINI in the last 168 hours. BNP (last 3 results) No results for input(s): PROBNP in the last 8760 hours. HbA1C: No results for input(s): HGBA1C in the last 72 hours. CBG: Recent Labs  Lab 07/11/24 1707 07/12/24 0734 07/12/24 1958 07/13/24 0818 07/15/24 1856  GLUCAP 108* 103* 108* 100* 124*   Lipid Profile: No results for input(s): CHOL, HDL, LDLCALC, TRIG, CHOLHDL, LDLDIRECT in the last 72 hours. Thyroid Function Tests: No results for input(s): TSH, T4TOTAL, FREET4, T3FREE, THYROIDAB in the last 72 hours. Anemia Panel: No results for input(s): VITAMINB12, FOLATE, FERRITIN, TIBC, IRON, RETICCTPCT in the last 72 hours. Sepsis Labs: No results for input(s): PROCALCITON, LATICACIDVEN in the last 168 hours.  No results found for this or any previous visit (from the past 240 hours).   Radiology Studies: CT BRAINLAB  HEAD W/O CONTRAST ( ) Result Date: 07/17/2024 CLINICAL DATA:  Left-sided subdural hematoma status post recent craniectomy. EXAM: CT HEAD WITHOUT CONTRAST TECHNIQUE: Contiguous axial images were obtained from the base of the skull through the vertex without intravenous contrast. RADIATION DOSE REDUCTION: This exam was performed according to the departmental dose-optimization program which includes automated exposure control, adjustment of the mA and/or kV according to patient size and/or use of iterative reconstruction technique. COMPARISON:  Head CT 07/14/2024 FINDINGS: Brain: Sequelae of left-sided craniectomy are again identified. There is a 1 cm rounded the hyperdense extra-axial focus along the craniectomy superficial to the temporal operculum which has enlarged and is suggestive of hemorrhage. A residual low-density subdural fluid collection measuring up to 10 mm in thickness over the anterior left frontal convexity has not significantly changed. No acute infarct, midline shift, or hydrocephalus is evident. Vascular: No hyperdense vessel. Skull: Left-sided craniectomy. Sinuses/Orbits: Minimal mucosal thickening in the paranasal sinuses. Moderate left mastoid effusion. Presumed cerumen in the external auditory canals. Unremarkable orbits. Other:  Left-sided scalp soft tissue swelling. Similar appearance of a small fluid collection within the scalp along the craniectomy. IMPRESSION: 1. Increased size of a 1 cm hyperdense focus of presumed extra-axial hemorrhage along the craniectomy. 2. Unchanged residual low-density subdural fluid collection over the anterior left frontal convexity. 3. No midline shift. Electronically Signed   By: Dasie Hamburg M.D.   On: 07/17/2024 17:39    Scheduled Meds:  amLODipine   5 mg Oral Daily   Chlorhexidine Gluconate Cloth  6 each Topical Daily   clonazePAM   1 mg Oral BID   folic acid   1 mg Oral Daily   lithium  carbonate  450 mg Oral QHS   lurasidone   40 mg Oral Q breakfast    sodium chloride  flush  10-40 mL Intracatheter Q12H   thiamine   100 mg Oral Daily   Continuous Infusions:   LOS: 3 days   Fredia Skeeter, MD [facility]  07/18/2024, 8:40 AM   *Please note that this is a verbal dictation therefore any spelling or grammatical errors are due to the Dragon Medical One system interpretation.  Please page via Amion and do not message via secure chat for urgent patient care matters. Secure chat can be used for non urgent patient care matters.  How to contact the [facility] Attending or Consulting provider 7A - 7P or covering provider during after hours 7P -7A, for this patient?  Check the care team in [facility] and look for a) attending/consulting TRH provider listed and b) the TR[facility] team listed. Page or secure chat 7A-7P. Log into [email or web address] and use [facility] universal password to access. If you do not have the password, please contact the hospital operator. Locate the [facility] provider you are looking for under [facility] and page to a number that you can be directly reached. If you still have difficulty reaching the provider, please page the [facility] (Director on Call) for the Hospitalists listed on amion for assistance.

## 2024-07-19 DIAGNOSIS — S065XAA Traumatic subdural hemorrhage with loss of consciousness status unknown, initial encounter: Secondary | ICD-10-CM | POA: Diagnosis not present

## 2024-07-19 MED ORDER — LORAZEPAM 2 MG/ML IJ SOLN
INTRAMUSCULAR | Status: AC
  Filled 2024-07-19: qty 1

## 2024-07-19 MED ADMIN — Lorazepam Inj 2 MG/ML: 2 mg | INTRAVENOUS | NDC 65219036801

## 2024-07-19 NOTE — Telephone Encounter (Signed)
 I just received an update from [place]: New scan has been submitted and the folks at [facility] said it looks good!  I expect to have a design report to send you for approval by Monday but if I get it any earlier I'll let you know.

## 2024-07-19 NOTE — Plan of Care (Signed)
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
## 2024-07-19 NOTE — Progress Notes (Signed)
 PROGRESS NOTE    Ernest Romero  FMW:[id number] DOB: 08/13/1966 DOA: 07/15/2024 PCP: [name]   Brief Narrative:  Patient is a 58 year old male with past medical history significant for alcohol abuse, bipolar and panic disorder.  Patient was admitted on June 30, 2024 at [facility] hospital after patient was found to be unresponsive in the jail.  CT of the head showed large subdural hematoma and patient underwent left frontoparietal craniotomy with evacuation of the subdural hematoma and placement of a drain at [facility] on 06/30/2024 which was eventually removed.  He was transferred from [facility] to [facility] for flap as per neurosurgery. Patient was on alcohol withdrawal protocol with phenobarbital which was tapered off. Patient is also on medications for his bipolar 1 panic disorder and also was noticed to be having frequent attacks of panic and agitation requiring multiple doses of sedation.   07/19/2024: Patient seen.  No new changes.  Assessment & Plan:   Principal Problem:   Subdural hematoma (HCC) Active Problems:   Bipolar disorder (HCC)   Essential hypertension   Alcohol withdrawal (HCC)  Subdural hematoma status post frontoparietal craniotomy indication at [facility] hospital on 06/22/2024: Transferred to [facility] for need for fly.  Seen by neurosurgery/Dr. Rosslyn, bone flap is ordered, per neurosurgery, plan to take him to the OR once that arrives.  Bipolar disorder with frequent panic attacks: Presently on lithium , Latuda  and Klonopin .  Was seen by psychiatry at [facility]  Checkley some level which is<0.44. psychiatry is following here, management per them.  Appreciate their help.  Essential hypertension: Blood pressure controlled.  Continue melatonin.  History of alcohol abuse: Was treated with tapering dose of phenobarbital at [facility].  Not withdrawing anymore.  DVT prophylaxis: SCDs Start: 07/16/24 0142   Code Status: Full Code  Family Communication:   Remains  inpatient appropriate because: Needs to be evaluated by neurosurgery   Estimated body mass index is 21.35 kg/m as calculated from the following:   Height as of this encounter: 5' 10 (1.778 m).   Weight as of this encounter: 67.5 kg.    Nutritional Assessment: Body mass index is 21.35 kg/m.SABRA Seen by dietician.  I agree with the assessment and plan as outlined below: Nutrition Status:        Consultants:  Neurosurgery  Procedures:  As above  Antimicrobials:  Anti-infectives (From admission, onward)    None         Subjective: Patient seen and examined.   Sitter at the bedside.   No new complaints.     Objective: Vitals:   07/18/24 1607 07/18/24 2021 07/19/24 0417 07/19/24 1208  BP: (!) 141/94 (!) 136/94 123/87 132/83  Pulse: 63 60 65 (!) 58  Resp: 17 (!) 24 17 18   Temp: (!) 97.2 F (36.2 C) 98.2 F (36.8 C)  98 F (36.7 C)  TempSrc: Axillary Oral  Oral  SpO2: 99% 100% 98% 98%  Weight:      Height:        Intake/Output Summary (Last 24 hours) at 07/19/2024 1821 Last data filed at 07/19/2024 0900 Gross per 24 hour  Intake 240 ml  Output 250 ml  Net -10 ml   Filed Weights   07/16/24 0800  Weight: 67.5 kg    Examination:  General exam: Appears calm and comfortable  Respiratory system: Clear to auscultation.  Cardiovascular system: S1 & S2. Gastrointestinal system: Abdomen is soft and nontender.  Central nervous system: Patient is awake and alert.  Data Reviewed: I have personally reviewed following labs and imaging studies  CBC: Recent Labs  Lab 07/14/24 1114 07/15/24 0454 07/16/24 0334  WBC 10.2 8.5 9.3  NEUTROABS  --   --  6.0  HGB 14.3 13.8 13.5  HCT 42.3 40.7 38.8*  MCV 88.3 87.7 86.0  PLT 464* 434* 438*   Basic Metabolic Panel: Recent Labs  Lab 07/14/24 1114 07/15/24 0454 07/16/24 0334  NA 140 140 136  K 4.1 4.1 3.9  CL 101 106 103  CO2 24 24 21*  GLUCOSE 100* 96 133*  BUN 16 27* 21*  CREATININE 0.98 0.78 1.04   CALCIUM 9.9 9.5 9.3  MG 2.2 2.5*  --    GFR: Estimated Creatinine Clearance: 73 mL/min (by C-G formula based on SCr of 1.04 mg/dL). Liver Function Tests: Recent Labs  Lab 07/16/24 0334  AST 48*  ALT 85*  ALKPHOS 97  BILITOT 0.5  PROT 6.8  ALBUMIN 3.4*   No results for input(s): LIPASE, AMYLASE in the last 168 hours. No results for input(s): AMMONIA in the last 168 hours. Coagulation Profile: No results for input(s): INR, PROTIME in the last 168 hours. Cardiac Enzymes: No results for input(s): CKTOTAL, CKMB, CKMBINDEX, TROPONINI in the last 168 hours. BNP (last 3 results) No results for input(s): PROBNP in the last 8760 hours. HbA1C: No results for input(s): HGBA1C in the last 72 hours. CBG: Recent Labs  Lab 07/12/24 1958 07/13/24 0818 07/15/24 1856  GLUCAP 108* 100* 124*   Lipid Profile: No results for input(s): CHOL, HDL, LDLCALC, TRIG, CHOLHDL, LDLDIRECT in the last 72 hours. Thyroid Function Tests: No results for input(s): TSH, T4TOTAL, FREET4, T3FREE, THYROIDAB in the last 72 hours. Anemia Panel: No results for input(s): VITAMINB12, FOLATE, FERRITIN, TIBC, IRON, RETICCTPCT in the last 72 hours. Sepsis Labs: No results for input(s): PROCALCITON, LATICACIDVEN in the last 168 hours.  No results found for this or any previous visit (from the past 240 hours).   Radiology Studies: No results found.   Scheduled Meds:  amLODipine   5 mg Oral Daily   Chlorhexidine Gluconate Cloth  6 each Topical Daily   clonazePAM   1 mg Oral BID   folic acid   1 mg Oral Daily   lithium  carbonate  450 mg Oral QHS   lurasidone   40 mg Oral Q breakfast   sodium chloride  flush  10-40 mL Intracatheter Q12H   thiamine   100 mg Oral Daily   Continuous Infusions:   LOS: 4 days   Leatrice LILLETTE Chapel, MD [facility]  07/19/2024, 6:21 PM   *Please note that this is a verbal dictation therefore any spelling or  grammatical errors are due to the Dragon Medical One system interpretation.  Please page via Amion and do not message via secure chat for urgent patient care matters. Secure chat can be used for non urgent patient care matters.  How to contact the TRH Attending or Consulting provider 7A - 7P or covering provider during after hours 7P -7A, for this patient?  Check the care team in [facility] and look for a) attending/consulting TRH provider listed and b) the TRH team listed. Page or secure chat 7A-7P. Log into [email or web address] and use [place]'s universal password to access. If you do not have the password, please contact the hospital operator. Locate the TRH provider you are looking for under [facility] and page to a number that you can be directly reached. If you still have difficulty reaching the provider, please page the DOC (  Director on Call) for the Hospitalists listed on amion for assistance.

## 2024-07-20 MED ORDER — LORAZEPAM 2 MG/ML IJ SOLN
2.0000 mg | INTRAMUSCULAR | Status: AC
  Administered 2024-07-20: 2 mg via INTRAVENOUS
  Filled 2024-07-20: qty 1

## 2024-07-20 NOTE — Progress Notes (Signed)
 OT Cancellation Note  Patient Details Name: Ernest Romero MRN: [id number] DOB: 02/04/1966   Cancelled Treatment:    Reason Eval/Treat Not Completed: Other (comment) (Security called for disruption. OT to reattempt to see pt for acute OT eval at a later time as appropriate/available.)  Margarie Rockey HERO., OTR/L, MA Acute Rehab [phone]   Margarie FORBES Horns 07/20/2024, 9:41 AM

## 2024-07-20 NOTE — Progress Notes (Signed)
 PROGRESS NOTE    Ernest Romero  FMW:[id number] DOB: 24-Mar-1966 DOA: 07/15/2024 PCP: [name]   Brief Narrative:  Patient is a 58 year old male with past medical history significant for alcohol abuse, bipolar and panic disorder.  Patient was admitted on June 30, 2024 at [facility] hospital after patient was found to be unresponsive in the jail.  CT of the head showed large subdural hematoma and patient underwent left frontoparietal craniotomy with evacuation of the subdural hematoma and placement of a drain at [facility] on 06/30/2024 which was eventually removed.  He was transferred from [facility] to [facility] for flap as per neurosurgery. Patient was on alcohol withdrawal protocol with phenobarbital which was tapered off. Patient is also on medications for his bipolar 1 panic disorder and also was noticed to be having frequent attacks of panic and agitation requiring multiple doses of sedation.   07/20/2024: Patient seen.  No new changes.  Assessment & Plan:   Principal Problem:   Subdural hematoma (HCC) Active Problems:   Bipolar disorder (HCC)   Essential hypertension   Alcohol withdrawal (HCC)  Subdural hematoma status post frontoparietal craniotomy indication at [facility] hospital on 06/22/2024: Transferred to [facility] for need for fly.  Seen by neurosurgery/Dr. Rosslyn, bone flap is ordered, per neurosurgery, plan to take him to the OR once that arrives.  Bipolar disorder with frequent panic attacks: Presently on lithium , Latuda  and Klonopin .  Was seen by psychiatry at [facility].  Checkley some level which is<0.44. psychiatry is following here, management per them.  Appreciate their help.  Essential hypertension: Blood pressure controlled.  Continue melatonin.  History of alcohol abuse: Was treated with tapering dose of phenobarbital at [facility].  Not withdrawing anymore.  DVT prophylaxis: SCDs Start: 07/16/24 0142   Code Status: Full Code  Family Communication:   Remains  inpatient appropriate because: Needs to be evaluated by neurosurgery   Estimated body mass index is 21.35 kg/m as calculated from the following:   Height as of this encounter: 5' 10 (1.778 m).   Weight as of this encounter: 67.5 kg.    Nutritional Assessment: Body mass index is 21.35 kg/m.SABRA Seen by dietician.  I agree with the assessment and plan as outlined below: Nutrition Status:        Consultants:  Neurosurgery  Procedures:  As above  Antimicrobials:  Anti-infectives (From admission, onward)    None         Subjective: Patient seen and examined.   Sitter at the bedside.   No new complaints.     Objective: Vitals:   07/19/24 1208 07/19/24 2129 07/20/24 0619 07/20/24 0938  BP: 132/83 (!) 133/97 (!) 146/97 (!) 170/97  Pulse: (!) 58 68 61 60  Resp: 18 19 18 16   Temp: 98 F (36.7 C) 98.7 F (37.1 C)  98.4 F (36.9 C)  TempSrc: Oral Oral  Oral  SpO2: 98% 99% 99% 99%  Weight:      Height:        Intake/Output Summary (Last 24 hours) at 07/20/2024 1933 Last data filed at 07/20/2024 1156 Gross per 24 hour  Intake 240 ml  Output 550 ml  Net -310 ml   Filed Weights   07/16/24 0800  Weight: 67.5 kg    Examination:  General exam: Appears calm and comfortable  Respiratory system: Clear to auscultation.  Cardiovascular system: S1 & S2. Gastrointestinal system: Abdomen is soft and nontender.  Central nervous system: Patient is awake and alert.    Data  Reviewed: I have personally reviewed following labs and imaging studies  CBC: Recent Labs  Lab 07/14/24 1114 07/15/24 0454 07/16/24 0334  WBC 10.2 8.5 9.3  NEUTROABS  --   --  6.0  HGB 14.3 13.8 13.5  HCT 42.3 40.7 38.8*  MCV 88.3 87.7 86.0  PLT 464* 434* 438*   Basic Metabolic Panel: Recent Labs  Lab 07/14/24 1114 07/15/24 0454 07/16/24 0334  NA 140 140 136  K 4.1 4.1 3.9  CL 101 106 103  CO2 24 24 21*  GLUCOSE 100* 96 133*  BUN 16 27* 21*  CREATININE 0.98 0.78 1.04  CALCIUM  9.9 9.5 9.3  MG 2.2 2.5*  --    GFR: Estimated Creatinine Clearance: 73 mL/min (by C-G formula based on SCr of 1.04 mg/dL). Liver Function Tests: Recent Labs  Lab 07/16/24 0334  AST 48*  ALT 85*  ALKPHOS 97  BILITOT 0.5  PROT 6.8  ALBUMIN 3.4*   No results for input(s): LIPASE, AMYLASE in the last 168 hours. No results for input(s): AMMONIA in the last 168 hours. Coagulation Profile: No results for input(s): INR, PROTIME in the last 168 hours. Cardiac Enzymes: No results for input(s): CKTOTAL, CKMB, CKMBINDEX, TROPONINI in the last 168 hours. BNP (last 3 results) No results for input(s): PROBNP in the last 8760 hours. HbA1C: No results for input(s): HGBA1C in the last 72 hours. CBG: Recent Labs  Lab 07/15/24 1856  GLUCAP 124*   Lipid Profile: No results for input(s): CHOL, HDL, LDLCALC, TRIG, CHOLHDL, LDLDIRECT in the last 72 hours. Thyroid Function Tests: No results for input(s): TSH, T4TOTAL, FREET4, T3FREE, THYROIDAB in the last 72 hours. Anemia Panel: No results for input(s): VITAMINB12, FOLATE, FERRITIN, TIBC, IRON, RETICCTPCT in the last 72 hours. Sepsis Labs: No results for input(s): PROCALCITON, LATICACIDVEN in the last 168 hours.  No results found for this or any previous visit (from the past 240 hours).   Radiology Studies: No results found.   Scheduled Meds:  amLODipine   5 mg Oral Daily   Chlorhexidine Gluconate Cloth  6 each Topical Daily   clonazePAM   1 mg Oral BID   folic acid   1 mg Oral Daily   lithium  carbonate  450 mg Oral QHS   lurasidone   40 mg Oral Q breakfast   sodium chloride  flush  10-40 mL Intracatheter Q12H   thiamine   100 mg Oral Daily   Continuous Infusions:   LOS: 5 days   Leatrice LILLETTE Chapel, MD [facility]  07/20/2024, 7:33 PM   *Please note that this is a verbal dictation therefore any spelling or grammatical errors are due to the Dragon Medical One  system interpretation.  Please page via Amion and do not message via secure chat for urgent patient care matters. Secure chat can be used for non urgent patient care matters.  How to contact the TRH Attending or Consulting provider 7A - 7P or covering provider during after hours 7P -7A, for this patient?  Check the care team in [facility] and look for a) attending/consulting TRH provider listed and b) the TRH team listed. Page or secure chat 7A-7P. Log into [email or web address] and use [place]'s universal password to access. If you do not have the password, please contact the hospital operator. Locate the TRH provider you are looking for under [facility] and page to a number that you can be directly reached. If you still have difficulty reaching the provider, please page the [facility] (Director on Call) for the Hospitalists listed  on amion for assistance.

## 2024-07-20 NOTE — Plan of Care (Signed)
   Problem: Clinical Measurements: Goal: Ability to maintain clinical measurements within normal limits will improve Outcome: Progressing Goal: Will remain free from infection Outcome: Progressing Goal: Diagnostic test results will improve Outcome: Progressing Goal: Respiratory complications will improve Outcome: Progressing Goal: Cardiovascular complication will be avoided Outcome: Progressing   Problem: Nutrition: Goal: Adequate nutrition will be maintained Outcome: Progressing   Problem: Elimination: Goal: Will not experience complications related to bowel motility Outcome: Progressing Goal: Will not experience complications related to urinary retention Outcome: Progressing   Problem: Safety: Goal: Ability to remain free from injury will improve Outcome: Progressing   Problem: Skin Integrity: Goal: Risk for impaired skin integrity will decrease Outcome: Progressing

## 2024-07-20 NOTE — Plan of Care (Signed)

## 2024-07-20 NOTE — Progress Notes (Signed)
 PT Cancellation Note  Patient Details Name: Ernest Romero MRN: [id number] DOB: 04-06-1966   Cancelled Treatment:    Reason Eval/Treat Not Completed: (P) Other (comment) (security called for disruption) PT will follow back when pt has calmed down as able   [name] PT, DPT Acute Rehabilitation Services Please use secure chat or  Call Office [phone]    [name] [facility] 07/20/2024, 9:37 AM

## 2024-07-20 NOTE — Plan of Care (Signed)

## 2024-07-21 NOTE — Evaluation (Addendum)
 Physical Therapy Re-Evaluation Patient Details Name: Ernest Romero MRN: [id number] DOB: January 14, 1966 Today's Date: 07/21/2024  History of Present Illness  Pt is a 58 y.o. male presenting 10/14 from [facility] where he was admitted 9/28 for unresponsive episode in the jail. 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 On CIWA protocol. Transferred to [facility] for further management of craniotomy and flap. PMH alcohol use disorder, psychiatric disorder, HTN  Clinical Impression  Re-Evaluation after transfer to [facility] for further surgical management. Since arrival pt has been increasingly agitated and often requiring sedatives for personal and staff safety. Therapy session conducted approximately 5.5 hours after dose of Haldol . Pt rouses to increased stimuli, is agreeable to come to sit EoB, which he does at a supervision level, and is able to maintain seated balance without outside assist. Pt becomes slightly aggravated with orientation and home set up questions, continually asking to lay back down due to being tired. Therapy stops questions and allows pt to return to supine as agitation increases. Pt lays back down without assist and falls back asleep. Therapy will follow at least weekly as agitation level allows as he awaits his next surgical intervention. PT will likely benefit from post acute rehab, however discharge location questionable, given his incarceration, behavioral health and medical condition.         If plan is discharge home, recommend the following: A lot of help with bathing/dressing/bathroom;A lot of help with walking and/or transfers;Assistance with cooking/housework;Direct supervision/assist for medications management;Direct supervision/assist for financial management;Assist for transportation;Help with stairs or ramp for entrance;Supervision due to cognitive status   Can travel by private vehicle   No    Equipment  Recommendations Other (comment) (TBD)     Functional Status Assessment Patient has had a recent decline in their functional status and demonstrates the ability to make significant improvements in function in a reasonable and predictable amount of time.     Precautions / Restrictions Precautions Precautions: Fall Recall of Precautions/Restrictions: Impaired Precaution/Restrictions Comments: helmet with mobility Restrictions Weight Bearing Restrictions Per Provider Order: No      Mobility  Bed Mobility Overal bed mobility: Needs Assistance Bed Mobility: Supine to Sit, Sit to Supine Rolling: Supervision   Supine to sit: Supervision     General bed mobility comments: no physical assist, but maximal encouragement to come to sit on the EoB    Transfers                   General transfer comment: deferred as pt frustrated with questions and constant request to return to supine       Balance Overall balance assessment: Modified Independent Sitting-balance support: Feet supported Sitting balance-Leahy Scale: Fair Sitting balance - Comments: pt able to sit without support, needs continued encouragement to stay sitting EoB secondary to fatigue and level of arousal                                     Pertinent Vitals/Pain Pain Assessment Pain Assessment: Faces Pain Score: 0-No pain Pain Location: repeats I'm tired and want to lay down Pain Intervention(s): Limited activity within patient's tolerance, Monitored during session, Repositioned    Home Living Family/patient expects to be discharged to:: Unsure (from jail, reports living with mother when he is not renting)  Additional Comments: Pt poor historian    Prior Function Prior Level of Function : Patient poor historian/Family not available;Independent/Modified Independent             Mobility Comments: per prior PT note lives in [place] and works Hospital doctor (will need to  confirm)       Extremity/Trunk Assessment   Upper Extremity Assessment Upper Extremity Assessment: Defer to OT evaluation    Lower Extremity Assessment Lower Extremity Assessment: Difficult to assess due to impaired cognition;Generalized weakness       Communication   Communication Communication: Impaired Factors Affecting Communication: Reduced clarity of speech (level of arousal)    Cognition Arousal: Lethargic, Suspect due to medications (received Haldol  6 am. rouses with increased stimulation, restless to return to supine to go back to sleep) Behavior During Therapy: Impulsive, Restless                       Rancho Levels of Cognitive Functioning Ranch[place] Mirant Scales of Cognitive Functioning: Confused/Agitated: Maximal Assistance Rancho Mirant Scales of Cognitive Functioning: Confused/Agitated: Maximal Assistance [IV] PT - Cognition Comments: oriented to self, month and hospital Following commands: Impaired Following commands impaired: Follows one step commands inconsistently     Cueing Cueing Techniques: Verbal cues, Tactile cues, Visual cues     General Comments General comments (skin integrity, edema, etc.): one on one sitter for aggitation        Assessment/Plan    PT Assessment Patient needs continued PT services  PT Problem List Decreased strength;Decreased activity tolerance;Decreased mobility;Decreased cognition;Decreased safety awareness       PT Treatment Interventions DME instruction;Gait training;Functional mobility training;Therapeutic activities;Therapeutic exercise;Balance training;Cognitive remediation;Patient/family education    PT Goals (Current goals can be found in the Care Plan section)  Acute Rehab PT Goals Patient Stated Goal: none stated PT Goal Formulation: Patient unable to participate in goal setting Time For Goal Achievement: 08/04/24 Potential to Achieve Goals: Fair    Frequency Min 2X/week      Co-evaluation PT/OT/SLP Co-Evaluation/Treatment: Yes Reason for Co-Treatment: Complexity of the patient's impairments (multi-system involvement);Necessary to address cognition/behavior during functional activity;To address functional/ADL transfers PT goals addressed during session: Mobility/safety with mobility OT goals addressed during session: ADL's and self-care       AM-PAC PT 6 Clicks Mobility  Outcome Measure Help needed turning from your back to your side while in a flat bed without using bedrails?: None Help needed moving from lying on your back to sitting on the side of a flat bed without using bedrails?: None Help needed moving to and from a bed to a chair (including a wheelchair)?: A Little Help needed standing up from a chair using your arms (e.g., wheelchair or bedside chair)?: A Little Help needed to walk in hospital room?: A Lot Help needed climbing 3-5 steps with a railing? : A Lot 6 Click Score: 18    End of Session   Activity Tolerance: Patient limited by lethargy Patient left: in bed;with bed alarm set;with nursing/sitter in room Nurse Communication: Mobility status PT Visit Diagnosis: Other symptoms and signs involving the nervous system (R29.898);Muscle weakness (generalized) (M62.81)    Time: 8874-8863 PT Time Calculation (min) (ACUTE ONLY): 11 min   Charges:   PT Evaluation $PT Re-evaluation: 1 Re-eval   PT General Charges $$ ACUTE PT VISIT: 1 Visit         Ernest Romero PT, DPT Acute Rehabilitation Services Please use secure chat or  Call Office [phone])  [phone]   Ernest Romero 07/21/2024, 11:58 AM

## 2024-07-21 NOTE — Plan of Care (Signed)

## 2024-07-21 NOTE — Progress Notes (Signed)
 PROGRESS NOTE    Ernest Romero  FMW:[id number] DOB: Nov 27, 1965 DOA: 07/15/2024 PCP: [name]   Brief Narrative:  Patient is a 58 year old male with past medical history significant for alcohol abuse, bipolar and panic disorder.  Patient was admitted on June 30, 2024 at [facility] hospital after patient was found to be unresponsive in the jail.  CT of the head showed large subdural hematoma and patient underwent left frontoparietal craniotomy with evacuation of the subdural hematoma and placement of a drain at [facility] on 06/30/2024 which was eventually removed.  He was transferred from [facility] to [facility] for flap as per neurosurgery. Patient was on alcohol withdrawal protocol with phenobarbital which was tapered off. Patient is also on medications for his bipolar 1 panic disorder and also was noticed to be having frequent attacks of panic and agitation requiring multiple doses of sedation.   07/21/2024: Patient seen.  No new changes.  Assessment & Plan:   Principal Problem:   Subdural hematoma (HCC) Active Problems:   Bipolar disorder (HCC)   Essential hypertension   Alcohol withdrawal (HCC)  Subdural hematoma status post frontoparietal craniotomy indication at [facility] hospital on 06/22/2024: Transferred to [facility] for need for fly.  Seen by neurosurgery/Dr. Rosslyn, bone flap is ordered, per neurosurgery, plan to take him to the OR once that arrives.  Bipolar disorder with frequent panic attacks: Presently on lithium , Latuda  and Klonopin .  Was seen by psychiatry at [facility].  [place]ley some level which is<0.44. psychiatry is following here, management per them.  Appreciate their help.  Essential hypertension: Blood pressure controlled.  Continue melatonin.  History of alcohol abuse: Was treated with tapering dose of phenobarbital at [facility]  Not withdrawing anymore.  DVT prophylaxis: SCDs Start: 07/16/24 0142   Code Status: Full Code  Family Communication:   Remains  inpatient appropriate because: Needs to be evaluated by neurosurgery   Estimated body mass index is 21.35 kg/m as calculated from the following:   Height as of this encounter: 5' 10 (1.778 m).   Weight as of this encounter: 67.5 kg.    Nutritional Assessment: Body mass index is 21.35 kg/m.SABRA Seen by dietician.  I agree with the assessment and plan as outlined below: Nutrition Status:        Consultants:  Neurosurgery  Procedures:  As above  Antimicrobials:  Anti-infectives (From admission, onward)    None         Subjective: Patient seen and examined.   Sitter at the bedside.   No new complaints.     Objective: Vitals:   07/20/24 2100 07/21/24 0354 07/21/24 0727 07/21/24 1653  BP: 127/74 (!) 135/92 (!) 153/63 122/87  Pulse: 62 (!) 56 71 61  Resp:      Temp: 98.7 F (37.1 C) 97.9 F (36.6 C) 98.1 F (36.7 C) 97.7 F (36.5 C)  TempSrc:   Oral Oral  SpO2: 100% 98% 100% 100%  Weight:      Height:        Intake/Output Summary (Last 24 hours) at 07/21/2024 1900 Last data filed at 07/21/2024 1218 Gross per 24 hour  Intake 10 ml  Output --  Net 10 ml   Filed Weights   07/16/24 0800  Weight: 67.5 kg    Examination:  General exam: Appears calm and comfortable  Respiratory system: Clear to auscultation.  Cardiovascular system: S1 & S2. Gastrointestinal system: Abdomen is soft and nontender.  Central nervous system: Patient is awake and alert.  Data Reviewed: I have personally reviewed following labs and imaging studies  CBC: Recent Labs  Lab 07/15/24 0454 07/16/24 0334  WBC 8.5 9.3  NEUTROABS  --  6.0  HGB 13.8 13.5  HCT 40.7 38.8*  MCV 87.7 86.0  PLT 434* 438*   Basic Metabolic Panel: Recent Labs  Lab 07/15/24 0454 07/16/24 0334  NA 140 136  K 4.1 3.9  CL 106 103  CO2 24 21*  GLUCOSE 96 133*  BUN 27* 21*  CREATININE 0.78 1.04  CALCIUM 9.5 9.3  MG 2.5*  --    GFR: Estimated Creatinine Clearance: 73 mL/min (by C-G formula  based on SCr of 1.04 mg/dL). Liver Function Tests: Recent Labs  Lab 07/16/24 0334  AST 48*  ALT 85*  ALKPHOS 97  BILITOT 0.5  PROT 6.8  ALBUMIN 3.4*   No results for input(s): LIPASE, AMYLASE in the last 168 hours. No results for input(s): AMMONIA in the last 168 hours. Coagulation Profile: No results for input(s): INR, PROTIME in the last 168 hours. Cardiac Enzymes: No results for input(s): CKTOTAL, CKMB, CKMBINDEX, TROPONINI in the last 168 hours. BNP (last 3 results) No results for input(s): PROBNP in the last 8760 hours. HbA1C: No results for input(s): HGBA1C in the last 72 hours. CBG: Recent Labs  Lab 07/15/24 1856  GLUCAP 124*   Lipid Profile: No results for input(s): CHOL, HDL, LDLCALC, TRIG, CHOLHDL, LDLDIRECT in the last 72 hours. Thyroid Function Tests: No results for input(s): TSH, T4TOTAL, FREET4, T3FREE, THYROIDAB in the last 72 hours. Anemia Panel: No results for input(s): VITAMINB12, FOLATE, FERRITIN, TIBC, IRON, RETICCTPCT in the last 72 hours. Sepsis Labs: No results for input(s): PROCALCITON, LATICACIDVEN in the last 168 hours.  No results found for this or any previous visit (from the past 240 hours).   Radiology Studies: No results found.   Scheduled Meds:  amLODipine   5 mg Oral Daily   Chlorhexidine Gluconate Cloth  6 each Topical Daily   clonazePAM   1 mg Oral BID   folic acid   1 mg Oral Daily   lithium  carbonate  450 mg Oral QHS   lurasidone   40 mg Oral Q breakfast   sodium chloride  flush  10-40 mL Intracatheter Q12H   thiamine   100 mg Oral Daily   Continuous Infusions:   LOS: 6 days   Leatrice LILLETTE Chapel, MD [facility]  07/21/2024, 7:00 PM   *Please note that this is a verbal dictation therefore any spelling or grammatical errors are due to the Dragon Medical One system interpretation.  Please page via Amion and do not message via secure chat for urgent patient  care matters. Secure chat can be used for non urgent patient care matters.  How to contact the [facility] Attending or Consulting provider 7A - 7P or covering provider during after hours 7P -7A, for this patient?  Check the care team in [facility] and look for a) attending/consulting TR[facility] provider listed and b) the TR[facility] team listed. Page or secure chat 7A-7P. Log into [email or web address] and use [facility]'s universal password to access. If you do not have the password, please contact the hospital operator. Locate the [facility] provider you are looking for under [facility] and page to a number that you can be directly reached. If you still have difficulty reaching the provider, please page the [facility] (Director on Call) for the Hospitalists listed on amion for assistance.

## 2024-07-21 NOTE — Evaluation (Signed)
 Occupational Therapy Evaluation Patient Details Name: Ernest Romero MRN: [id number] DOB: 27-May-1966 Today's Date: 07/21/2024   History of Present Illness   Pt is a 58 y.o. male presenting 10/14 from [facility] where he was admitted 9/28 for unresponsive episode in the jail. 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 On CIWA protocol. Transferred to [facility] for further management of craniotomy and flap. PMH alcohol use disorder, psychiatric disorder, HTN     Clinical Impressions Pt re-evaluated s/p transfer from [facility] for further surgical management. Per chart, pt with increased agitation, and RN reports pt required haldol  around ~6am this morning. Pt arousing with increased stimuli and agreeable to come sit EOB, following one step commands with increased time, but needing constant cues to sustain tasks even sitting EOB. Pt sitting EOB with up to CGA, and continually asking to return to supine. Pt needing max A for orientation questions. OT to continue to follow. Pt will likely benefit from post acute rehab, however discharge location questionable, given his incarceration, behavioral health and medical condition.        If plan is discharge home, recommend the following:   A lot of help with bathing/dressing/bathroom;A lot of help with walking and/or transfers;Supervision due to cognitive status;Direct supervision/assist for medications management;Assistance with cooking/housework;Direct supervision/assist for financial management;Assist for transportation     Functional Status Assessment   Patient has had a recent decline in their functional status and demonstrates the ability to make significant improvements in function in a reasonable and predictable amount of time.     Equipment Recommendations   Other (comment) (defer)     Recommendations for Other Services         Precautions/Restrictions    Precautions Precautions: Fall Recall of Precautions/Restrictions: Impaired Precaution/Restrictions Comments: helmet with mobility Restrictions Weight Bearing Restrictions Per Provider Order: No     Mobility Bed Mobility Overal bed mobility: Needs Assistance Bed Mobility: Supine to Sit, Sit to Supine Rolling: Supervision   Supine to sit: Supervision Sit to supine: Supervision   General bed mobility comments: no physical assist, but maximal encouragement to come to sit on the EoB    Transfers                   General transfer comment: deferred as pt frustrated with questions and constant request to return to supine      Balance Overall balance assessment: Modified Independent Sitting-balance support: Feet supported Sitting balance-Leahy Scale: Fair Sitting balance - Comments: pt able to sit without support, needs continued encouragement to stay sitting EoB secondary to fatigue and level of arousal                                   ADL either performed or assessed with clinical judgement   ADL Overall ADL's : Needs assistance/impaired     Grooming: Wash/dry face;Maximal assistance;Sitting                                       Vision Patient Visual Report:  (pt reports no change, however, per OT note from [facility], vision was blurry) Additional Comments: OT to continue to assess     Perception         Praxis  Pertinent Vitals/Pain Pain Assessment Pain Assessment: Faces Faces Pain Scale: No hurt Pain Location: repeats I'm tired and want to lay down Pain Intervention(s): Limited activity within patient's tolerance, Monitored during session     Extremity/Trunk Assessment Upper Extremity Assessment Upper Extremity Assessment: Difficult to assess due to impaired cognition;Generalized weakness   Lower Extremity Assessment Lower Extremity Assessment: Defer to PT evaluation       Communication  Communication Communication: Impaired Factors Affecting Communication: Reduced clarity of speech (level of arousal)   Cognition Arousal: Lethargic, Suspect due to medications (received Haldol  6 am. rouses with increased stimulation, restless to return to supine to go back to sleep) Behavior During Therapy: Impulsive, Restless Cognition: No family/caregiver present to determine baseline, Cognition impaired, [place] level   Orientation impairments: Time, Situation, Place Awareness: Intellectual awareness impaired, Online awareness impaired Memory impairment (select all impairments): Short-term memory, Declarative long-term memory, Non-declarative long-term memory Attention impairment (select first level of impairment): Focused attention Executive functioning impairment (select all impairments): Organization, Sequencing, Reasoning, Problem solving OT - Cognition Comments: follows one step commands with increasd time. Does not sustain tasks this session               [facility] Scales of Cognitive Functioning: Confused/Agitated: Maximal Assistance [IV] Following commands: Impaired Following commands impaired: Follows one step commands inconsistently     Cueing  General Comments   Cueing Techniques: Verbal cues;Tactile cues;Visual cues  one on one sitter for aggitation   Exercises     Shoulder Instructions      Home Living Family/patient expects to be discharged to:: Unsure (from jail, reports living with mother when he is not renting)                                 Additional Comments: Pt poor historian      Prior Functioning/Environment Prior Level of Function : Patient poor historian/Family not available;Independent/Modified Independent             Mobility Comments: per prior PT note 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 (will need to confirm)      OT Problem List: Decreased strength;Decreased activity tolerance;Impaired  vision/perception;Decreased cognition;Decreased knowledge of use of DME or AE;Impaired tone;Impaired balance (sitting and/or standing);Decreased coordination;Decreased safety awareness;Decreased knowledge of precautions   OT Treatment/Interventions: Self-care/ADL training;Energy conservation;Cognitive remediation/compensation;Balance training;Modalities;Therapeutic exercise;DME and/or AE instruction;Splinting;Patient/family education;Therapeutic activities;Neuromuscular education      OT Goals(Current goals can be found in the care plan section)   Acute Rehab OT Goals Patient Stated Goal: lay down OT Goal Formulation: With patient Time For Goal Achievement: 08/04/24 Potential to Achieve Goals: Fair   OT Frequency:  Min 2X/week    Co-evaluation PT/OT/SLP Co-Evaluation/Treatment: Yes Reason for Co-Treatment: Complexity of the patient's impairments (multi-system involvement);Necessary to address cognition/behavior during functional activity;To address functional/ADL transfers PT goals addressed during session: Mobility/safety with mobility OT goals addressed during session: ADL's and self-care      AM-PAC OT 6 Clicks Daily Activity     Outcome Measure Help from another person eating meals?: A Lot Help from another person taking care of personal grooming?: A Lot Help from another person toileting, which includes using toliet, bedpan, or urinal?: A Lot Help from another person bathing (including washing, rinsing, drying)?: A Lot Help from another person to put on and taking off regular upper body clothing?: A Lot Help from another person to put on and taking off regular lower body clothing?: A  Lot 6 Click Score: 12   End of Session Nurse Communication: Mobility status  Activity Tolerance: Patient limited by lethargy Patient left: in bed;with call bell/phone within reach;with bed alarm set;with nursing/sitter in room  OT Visit Diagnosis: Other abnormalities of gait and mobility  (R26.89);Muscle weakness (generalized) (M62.81);Other symptoms and signs involving the nervous system (R29.898);Cognitive communication deficit (R41.841);Other symptoms and signs involving cognitive function                Time: 1125-1136 OT Time Calculation (min): 11 min Charges:  OT General Charges $OT Visit: 1 Visit OT Evaluation $OT Eval Moderate Complexity: 1 Mod  Elma JONETTA Lebron FREDERICK, OTR/L [facility] Acute Rehabilitation Office: [phone]   Elma JONETTA Lebron 07/21/2024, 1:12 PM

## 2024-07-22 NOTE — Plan of Care (Signed)
  Problem: Education: Goal: Knowledge of General Education information will improve Description: Including pain rating scale, medication(s)/side effects and non-pharmacologic comfort measures 07/22/2024 1855 by Hayes Lenis, RN Outcome: Not Progressing 07/22/2024 1838 by Hayes Lenis, RN Outcome: Not Progressing   Problem: Health Behavior/Discharge Planning: Goal: Ability to manage health-related needs will improve 07/22/2024 1855 by Hayes Lenis, RN Outcome: Not Progressing 07/22/2024 1838 by Hayes Lenis, RN Outcome: Not Progressing   Problem: Clinical Measurements: Goal: Ability to maintain clinical measurements within normal limits will improve 07/22/2024 1855 by Hayes Lenis, RN Outcome: Not Progressing 07/22/2024 1838 by Hayes Lenis, RN Outcome: Not Progressing Goal: Will remain free from infection 07/22/2024 1855 by Hayes Lenis, RN Outcome: Not Progressing 07/22/2024 1838 by Hayes Lenis, RN Outcome: Not Progressing Goal: Diagnostic test results will improve 07/22/2024 1855 by Hayes Lenis, RN Outcome: Not Progressing 07/22/2024 1838 by Hayes Lenis, RN Outcome: Not Progressing Goal: Respiratory complications will improve 07/22/2024 1855 by Hayes Lenis, RN Outcome: Not Progressing 07/22/2024 1838 by Hayes Lenis, RN Outcome: Not Progressing Goal: Cardiovascular complication will be avoided 07/22/2024 1855 by Hayes Lenis, RN Outcome: Not Progressing 07/22/2024 1838 by Hayes Lenis, RN Outcome: Not Progressing   Problem: Activity: Goal: Risk for activity intolerance will decrease 07/22/2024 1855 by Hayes Lenis, RN Outcome: Not Progressing 07/22/2024 1838 by Hayes Lenis, RN Outcome: Not Progressing   Problem: Nutrition: Goal: Adequate nutrition will be maintained 07/22/2024 1855 by Hayes Lenis, RN Outcome: Not Progressing 07/22/2024 1838 by Hayes Lenis, RN Outcome: Not Progressing   Problem: Coping: Goal: Level of anxiety will decrease 07/22/2024 1855 by Hayes Lenis, RN Outcome: Not Progressing 07/22/2024 1838 by Hayes Lenis, RN Outcome: Not Progressing   Problem: Elimination: Goal: Will not experience complications related to bowel motility 07/22/2024 1855 by Hayes Lenis, RN Outcome: Not Progressing 07/22/2024 1838 by Hayes Lenis, RN Outcome: Not Progressing Goal: Will not experience complications related to urinary retention 07/22/2024 1855 by Hayes Lenis, RN Outcome: Not Progressing 07/22/2024 1838 by Hayes Lenis, RN Outcome: Not Progressing   Problem: Pain Managment: Goal: General experience of comfort will improve and/or be controlled 07/22/2024 1855 by Hayes Lenis, RN Outcome: Not Progressing 07/22/2024 1838 by Hayes Lenis, RN Outcome: Not Progressing   Problem: Safety: Goal: Ability to remain free from injury will improve 07/22/2024 1855 by Hayes Lenis, RN Outcome: Not Progressing 07/22/2024 1838 by Hayes Lenis, RN Outcome: Not Progressing   Problem: Skin Integrity: Goal: Risk for impaired skin integrity will decrease 07/22/2024 1855 by Hayes Lenis, RN Outcome: Not Progressing 07/22/2024 1838 by Hayes Lenis, RN Outcome: Not Progressing

## 2024-07-22 NOTE — Progress Notes (Signed)
 PROGRESS NOTE    Ernest Romero  FMW:[id number] DOB: 05/04/1966 DOA: 07/15/2024 PCP: [name]   Brief Narrative:  Patient is a 58 year old male with past medical history significant for alcohol abuse, bipolar and panic disorder.  Patient was admitted on June 30, 2024 at [facility] hospital after patient was found to be unresponsive in the jail.  CT of the head showed large subdural hematoma and patient underwent left frontoparietal craniotomy with evacuation of the subdural hematoma and placement of a drain at [facility] on 06/30/2024 which was eventually removed.  He was transferred from [facility] to [facility] for flap as per neurosurgery. Patient was on alcohol withdrawal protocol with phenobarbital which was tapered off. Patient is also on medications for his bipolar 1 panic disorder and also was noticed to be having frequent attacks of panic and agitation requiring multiple doses of sedation.   07/22/2024: Patient seen.  No new changes.  Complicated with neurosurgery team, Dr. Dino CHRISTELLA Sable.  Neurosurgery team is still awaiting the flap.  Assessment & Plan:   Principal Problem:   Subdural hematoma (HCC) Active Problems:   Bipolar disorder (HCC)   Essential hypertension   Alcohol withdrawal (HCC)  Subdural hematoma status post frontoparietal craniotomy indication at [facility] hospital on 06/22/2024: Transferred to [facility] for need for fly.  Seen by neurosurgery/Dr. Sable, bone flap is ordered, per neurosurgery, plan to take him to the OR once that arrives.  Bipolar disorder with frequent panic attacks: Presently on lithium , Latuda  and Klonopin .  Was seen by psychiatry at [facility].  [place]ley some level which is<0.44. psychiatry is following here, management per them.  Appreciate their help.  Essential hypertension: Blood pressure controlled.  Continue melatonin.  History of alcohol abuse: Was treated with tapering dose of phenobarbital at [facility].  Not withdrawing anymore.  DVT  prophylaxis: SCDs Start: 07/16/24 0142   Code Status: Full Code  Family Communication:   Remains inpatient appropriate because: Needs to be evaluated by neurosurgery   Estimated body mass index is 21.35 kg/m as calculated from the following:   Height as of this encounter: 5' 10 (1.778 m).   Weight as of this encounter: 67.5 kg.    Nutritional Assessment: Body mass index is 21.35 kg/m.SABRA Seen by dietician.  I agree with the assessment and plan as outlined below: Nutrition Status:        Consultants:  Neurosurgery  Procedures:  As above  Antimicrobials:  Anti-infectives (From admission, onward)    None         Subjective: Patient seen and examined.   Sitter at the bedside.   No new complaints.     Objective: Vitals:   07/21/24 2343 07/22/24 0423 07/22/24 0806 07/22/24 1541  BP: (!) 139/95 110/89 (!) 137/96 (!) 144/90  Pulse: 69 (!) 58 (!) 58 63  Resp: 17 17 16 16   Temp: 98.7 F (37.1 C) 99.1 F (37.3 C) 98.1 F (36.7 C) 98.3 F (36.8 C)  TempSrc:   Oral Oral  SpO2: 97% 97% 98% 96%  Weight:      Height:        Intake/Output Summary (Last 24 hours) at 07/22/2024 1630 Last data filed at 07/22/2024 0830 Gross per 24 hour  Intake 480 ml  Output 625 ml  Net -145 ml   Filed Weights   07/16/24 0800  Weight: 67.5 kg    Examination:  General exam: Appears calm and comfortable  Respiratory system: Clear to auscultation.  Cardiovascular system: S1 &  S2. Gastrointestinal system: Abdomen is soft and nontender.  Central nervous system: Patient is awake and alert.    Data Reviewed: I have personally reviewed following labs and imaging studies  CBC: Recent Labs  Lab 07/16/24 0334  WBC 9.3  NEUTROABS 6.0  HGB 13.5  HCT 38.8*  MCV 86.0  PLT 438*   Basic Metabolic Panel: Recent Labs  Lab 07/16/24 0334  NA 136  K 3.9  CL 103  CO2 21*  GLUCOSE 133*  BUN 21*  CREATININE 1.04  CALCIUM 9.3   GFR: Estimated Creatinine Clearance: 73 mL/min  (by C-G formula based on SCr of 1.04 mg/dL). Liver Function Tests: Recent Labs  Lab 07/16/24 0334  AST 48*  ALT 85*  ALKPHOS 97  BILITOT 0.5  PROT 6.8  ALBUMIN 3.4*   No results for input(s): LIPASE, AMYLASE in the last 168 hours. No results for input(s): AMMONIA in the last 168 hours. Coagulation Profile: No results for input(s): INR, PROTIME in the last 168 hours. Cardiac Enzymes: No results for input(s): CKTOTAL, CKMB, CKMBINDEX, TROPONINI in the last 168 hours. BNP (last 3 results) No results for input(s): PROBNP in the last 8760 hours. HbA1C: No results for input(s): HGBA1C in the last 72 hours. CBG: Recent Labs  Lab 07/15/24 1856  GLUCAP 124*   Lipid Profile: No results for input(s): CHOL, HDL, LDLCALC, TRIG, CHOLHDL, LDLDIRECT in the last 72 hours. Thyroid Function Tests: No results for input(s): TSH, T4TOTAL, FREET4, T3FREE, THYROIDAB in the last 72 hours. Anemia Panel: No results for input(s): VITAMINB12, FOLATE, FERRITIN, TIBC, IRON, RETICCTPCT in the last 72 hours. Sepsis Labs: No results for input(s): PROCALCITON, LATICACIDVEN in the last 168 hours.  No results found for this or any previous visit (from the past 240 hours).   Radiology Studies: No results found.   Scheduled Meds:  amLODipine   5 mg Oral Daily   Chlorhexidine Gluconate Cloth  6 each Topical Daily   clonazePAM   1 mg Oral BID   folic acid   1 mg Oral Daily   lithium  carbonate  450 mg Oral QHS   lurasidone   40 mg Oral Q breakfast   sodium chloride  flush  10-40 mL Intracatheter Q12H   thiamine   100 mg Oral Daily   Continuous Infusions:   LOS: 7 days   Leatrice LILLETTE Chapel, MD [facility]  07/22/2024, 4:30 PM   *Please note that this is a verbal dictation therefore any spelling or grammatical errors are due to the Dragon Medical One system interpretation.  Please page via Amion and do not message via secure chat for  urgent patient care matters. Secure chat can be used for non urgent patient care matters.  How to contact the [facility] Attending or Consulting provider 7A - 7P or covering provider during after hours 7P -7A, for this patient?  Check the care team in [facility] and look for a) attending/consulting TR[facility] provider listed and b) the [facility] team listed. Page or secure chat 7A-7P. Log into [email or web address] and use [place]'s universal password to access. If you do not have the password, please contact the hospital operator. Locate the [facility] provider you are looking for under [facility] and page to a number that you can be directly reached. If you still have difficulty reaching the provider, please page the [facility] (Director on Call) for the Hospitalists listed on amion for assistance.

## 2024-07-22 NOTE — Telephone Encounter (Signed)
 Design report has been received and approved by Dr Janjua. Joeann is aware.

## 2024-07-23 NOTE — Progress Notes (Signed)
 Pt received breakfast and ate 50%, drink all fluids. Pt has been to restroom x3. Pt received lunch and ate fruit cup (drink tea & water) of tray.

## 2024-07-23 NOTE — Progress Notes (Signed)
 Progress Note    Ernest Romero   FMW:[id number]  DOB: 08-11-1966  DOA: 07/15/2024     8 PCP: [name]  Initial CC: Transferred from [facility] for bone flap  Hospital Course: [name] is a 58 year old male with PMH alcohol abuse, bipolar and panic disorder.   Patient was admitted on June 30, 2024 at [facility] hospital after patient was found to be unresponsive in the jail.  CT of the head showed large subdural hematoma and patient underwent left frontoparietal craniotomy with evacuation of the subdural hematoma and placement of a drain at [facility] on 06/30/2024 which was eventually removed.   He was transferred from [facility] to [facility] for flap as per neurosurgery. Patient was on alcohol withdrawal protocol with phenobarbital which was tapered off. Patient is also on medications for his bipolar 1 panic disorder and also was noticed to be having frequent attacks of panic and agitation requiring multiple doses of sedation.     Assessment & Plan:  Subdural hematoma  - s/p frontoparietal craniotomy indication at [facility] hospital on 06/22/2024: Transferred to [facility] for need for flap - Seen by neurosurgery/Dr. Rosslyn, bone flap is ordered, per neurosurgery, plan to take him to the OR once that arrives.   Bipolar disorder  - Evaluated by psychiatry -Was having frequent panic attacks  - Continue Klonopin , lithium , Latuda  - Haldol  as needed - has been under IVC over past week due to behaviors upon transfer from [facility]; will discuss with staff if necessary to renew  HTN - Continue amlodipine    History of alcohol abuse -  Was treated with tapering dose of phenobarbital at [facility].  Not withdrawing anymore.  Interval History:  No events overnight.  Sitter at bedside this morning.  He has been ambulating with walker around room well.  Eating fairly well.  Behaviors have been normal at least over past 24 hours from note review.   Antimicrobials:   DVT prophylaxis:  SCDs Start:  07/16/24 0142   Code Status:   Code Status: Full Code  Mobility Assessment (Last 72 Hours)     Mobility Assessment     Row Name 07/23/24 0945 07/22/24 2149 07/22/24 1015 07/21/24 2100 07/21/24 1706   Does the patient have exclusion criteria? No - Perform mobility assessment No - Perform mobility assessment No - Perform mobility assessment No - Perform mobility assessment No - Perform mobility assessment   Mobility Assessment Exclusion Criteria No exclusion criteria present, perform mobility assessment No exclusion criteria present, perform mobility assessment -- -- --   What is the highest level of mobility based on the mobility assessment? Level 4 (Ambulates with assistance) - Balance while stepping forward/back - Complete Level 4 (Ambulates with assistance) - Balance while stepping forward/back - Complete Level 4 (Ambulates with assistance) - Balance while stepping forward/back - Complete Level 4 (Ambulates with assistance) - Balance while stepping forward/back - Complete Level 1 (Bedfast) - Unable to balance while sitting on edge of bed   Is the above level different from baseline mobility prior to current illness? Yes - Recommend PT order Yes - Recommend PT order Yes - Recommend PT order Yes - Recommend PT order Yes - Recommend PT order    Row Name 07/21/24 1300 07/21/24 1238 07/21/24 1100 07/20/24 2030 07/20/24 1634   Does the patient have exclusion criteria? -- No - Perform mobility assessment No - Perform mobility assessment No - Perform mobility assessment No - Perform mobility assessment   Mobility Assessment Exclusion Criteria --  No exclusion criteria present, perform mobility assessment No exclusion criteria present, perform mobility assessment -- --   What is the highest level of mobility based on the mobility assessment? Level 1 (Bedfast) - Unable to balance while sitting on edge of bed Level 1 (Bedfast) - Unable to balance while sitting on edge of bed Level 1 (Bedfast) - Unable to  balance while sitting on edge of bed Level 4 (Ambulates with assistance) - Balance while stepping forward/back - Complete Level 4 (Ambulates with assistance) - Balance while stepping forward/back - Complete   Is the above level different from baseline mobility prior to current illness? -- -- Yes - Recommend PT order Yes - Recommend PT order --      Barriers to discharge: None Disposition Plan: TBD HH orders placed:  Status is: Inpatient  Objective: Blood pressure 125/86, pulse 64, temperature 97.6 F (36.4 C), resp. rate 17, height 5' 10 (1.778 m), weight 67.5 kg, SpO2 98%.  Examination:  Physical Exam Constitutional:      General: He is not in acute distress.    Appearance: Normal appearance.  HENT:     Head:     Comments: Left frontoparietal craniotomy noted.  Surgical incision well-healed    Mouth/Throat:     Mouth: Mucous membranes are moist.  Eyes:     Extraocular Movements: Extraocular movements intact.  Cardiovascular:     Rate and Rhythm: Normal rate and regular rhythm.  Pulmonary:     Effort: Pulmonary effort is normal. No respiratory distress.     Breath sounds: Normal breath sounds. No wheezing.  Abdominal:     General: Bowel sounds are normal. There is no distension.     Palpations: Abdomen is soft.     Tenderness: There is no abdominal tenderness.  Musculoskeletal:        General: Normal range of motion.     Cervical back: Normal range of motion and neck supple.  Skin:    General: Skin is warm and dry.  Neurological:     Mental Status: He is alert.     Comments: Slowed mentation noted.  Psychiatric:     Comments: Cooperative behavior      Consultants:  Psychiatry Neurosurgery  Procedures:    Data Reviewed: No results found for this or any previous visit (from the past 24 hours).  I have reviewed pertinent nursing notes, vitals, labs, and images as necessary. I have ordered labwork to follow up on as indicated.  I have reviewed the last notes  from staff over past 24 hours. I have discussed patient's care plan and test results with nursing staff, CM/SW, and other staff as appropriate.  Old records reviewed in assessment of this patient  Time spent: Greater than 50% of the 55 minute visit was spent in counseling/coordination of care for the patient as laid out in the A&P.   LOS: 8 days   Alm Apo, MD [facility] 07/23/2024, 2:41 PM

## 2024-07-23 NOTE — Plan of Care (Signed)

## 2024-07-23 NOTE — Plan of Care (Signed)
 Sitter at bedside during shift. Slept most of shift. Disconnected tele unit from leads because they were in the way. Redirected and reconnected. Up to restroom twice with sitter and helmet. No BM. Eating independently. Still disoriented. Awaiting flap surgery.  Problem: Clinical Measurements: Goal: Ability to maintain clinical measurements within normal limits will improve Outcome: Progressing Goal: Will remain free from infection Outcome: Progressing Goal: Diagnostic test results will improve Outcome: Progressing Goal: Respiratory complications will improve Outcome: Progressing Goal: Cardiovascular complication will be avoided Outcome: Progressing   Problem: Coping: Goal: Level of anxiety will decrease Outcome: Progressing   Problem: Elimination: Goal: Will not experience complications related to urinary retention Outcome: Progressing   Problem: Pain Managment: Goal: General experience of comfort will improve and/or be controlled Outcome: Progressing   Problem: Safety: Goal: Ability to remain free from injury will improve Outcome: Progressing   Problem: Skin Integrity: Goal: Risk for impaired skin integrity will decrease Outcome: Progressing   Problem: Education: Goal: Knowledge of General Education information will improve Description: Including pain rating scale, medication(s)/side effects and non-pharmacologic comfort measures Outcome: Not Progressing   Problem: Health Behavior/Discharge Planning: Goal: Ability to manage health-related needs will improve Outcome: Not Progressing   Problem: Activity: Goal: Risk for activity intolerance will decrease Outcome: Not Progressing   Problem: Nutrition: Goal: Adequate nutrition will be maintained Outcome: Not Progressing   Problem: Elimination: Goal: Will not experience complications related to bowel motility Outcome: Not Progressing

## 2024-07-23 NOTE — Progress Notes (Signed)
 PT Cancellation Note  Patient Details Name: Ernest Romero MRN: [id number] DOB: 1966-05-28   Cancelled Treatment:    Reason Eval/Treat Not Completed: Fatigue/lethargy limiting ability to participate; attempted 3 x today, pt had mobilizing recently prior to me checking on him with sitter support and RW.  Will continue attempts.    Montie Portal 07/23/2024, 5:29 PM Micheline Portal, PT Acute Rehabilitation Services [phone] 07/23/2024

## 2024-07-23 NOTE — Hospital Course (Addendum)
 Ernest Romero is a 58 y.o. male with a history of alcohol abuse, bipolar disorder, panic disorder.  Patient presented secondary to being found down and found to have evidence of a large subdural hematoma, s/p left frontoparietal craniotomy with evacuation of the subdural hematoma and placement of a drain and subsequent removal. Patient required further management of his craniotomy and flap, which necessitated transfer from [facility] to [facility], where neurosurgery performed a leftsided allograft cranioplasty. Repeat CT head was significant for worsening fluid, requiring a left frontal burr hole for evacuation on 10/30. 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 Patient underwent a left bone flap removal and was treated with IV antibiotics for total of 8 weeks. Patient is currently a ward of the state with DSS and does not have any bed offers and is difficult to place.    Significant Hospital events: 07/29/2024 crani w/ flap replacement 10/29: off clevi, liberate SBP goal, Klonopin , lithium , Haldol  as needed discontinued.  Latuda  dose cut by half 10/30: repeat CT head with increased collection  going to OR this afternoon for evacuation 10/31: OR  for SDH evacuation.  Developed arm twitching.  Klonopin  restarted at half dose 11/1: More awake.  No complaint 11/2 transferred out of ICU. 11/4 cortrak removed 11/8 CT head showed fluid collection under bone flap, decision made to remove bone flap 11/10 to OR for L bone flap removal 11/11 repeat CT head stable 11/29: notice to have worsening Dysphagia.  12/1 repeat CT head: stable.  12/3: Cortrack placed for nutrition, sutures removed by neurosurgery.  Tube feeds started. 12/4: Very sedated, psychiatry requested to readjust meds, reduced clonazepam  to0.5mg  BID, decreased valbenazine  to 40 mg daily 12/5: Noted oriented but jaw tremors worse.  Evaluated by  neurology, recommended psych to continue to follow and adjust medications  Subjective: Seen and examined today Patient is having his meal he is alert awake oriented not in distress  Reports she has been mobilizing Overnight afebrile vitals stable, on room air, Labsreviewed from 1/27 with a stable CMP CBC although mild elevated ALT  Meds reviewed: -remains on Cogentin , Klonopin  3 times daily, Remeron  15 mg bedtime Ingrezza  daily and inderal . On folate, iron supplement multivitamin, PPI, NicoDerm, and bowel regimen On as needed melatonin, p.o. Ativan  last use 1/23  Assessment and plan:  Recurrent subdural hematoma: s/p left frontoparietal craniotomy with evacuation, and placement of a drain and subsequent removal. See above for procedure.  Neurologically stable and neurosurgery signed off.    MRSA infected bone flap: S/p 8 weeks of antibiotics, IV vancomycin   through 09/23/2024 and additional 2 weeks of doxycycline  completed on 10/18/2024.   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 Cont propranolol  , clonazepam , valbenazine    Dysphagia: Required NG tube feeding from 12/3 to 09/09/2024 now on DYS 3 diet   Hypertension: Well-controlled on propranolol , amlodipine , losartan    IDA: Stable, continue oral iron supplementation   Acute hypoxic respiratory failure secondary to aspiration pneumonia: Resolved, treated with Unasyn .  On room air currently   Deconditioning/debility Disposition  Complex, ward of the state with DSS, Patient's support counselor Ernest Romero has been visiting intermittently. ([phone] PT Orders: Active PT Follow up Rec: Home  Health Pt2/11/2024 1620   DVT  prophylaxis: enoxaparin  (LOVENOX ) injection 40 mg Start: 10/01/24 1400 Place TED hose Start: 08/12/24 1150 SCDs Start: 08/01/24 1745 Place and maintain sequential compression device Start: 07/29/24 1819 Code Status:   Code Status: Full Code Family Communication: plan of care discussed with patient at bedside. Patient status is: Remains hospitalized because of severity of illness Level of care: Med-Surg   Objective: Vitals last 24 hrs: Vitals:   11/04/24 2101 11/05/24 0014 11/05/24 0427 11/05/24 0750  BP: 112/85 103/62 110/79 (!) 116/93  Pulse: 65 (!) 58 (!) 51 (!) 52  Resp: 16 18 18 15   Temp: 98.1 F (36.7 C) 98 F (36.7 C) 97.7 F (36.5 C)   TempSrc:      SpO2: 96% 95% 95% 97%  Weight:      Height:        Physical Examination: General exam: alert awake, oriented, pleasant and able to interact and follow commands HEENT:Oral mucosa moist, Ear/Nose WNL grossly Respiratory system: Bilaterally clear BS,no use of accessory muscle Cardiovascular system: S1 & S2 +, No JVD. Gastrointestinal system: Abdomen soft,NT,ND, BS+ Nervous System: Alert, awake, moving all extremities,and following commands. Extremities: extremities warm, leg edema NEG Skin: Warm, no rashes MSK: Normal muscle bulk,tone, power   Medications reviewed:  Scheduled Meds:  benztropine   1 mg Oral BID   clonazePAM   0.5 mg Oral TID   docusate  100 mg Oral Daily   enoxaparin  (LOVENOX ) injection  40 mg Subcutaneous Daily   feeding supplement  237 mL Oral BID BM   ferrous sulfate   325 mg Oral Q breakfast   folic acid   1 mg Oral Daily   gabapentin   200 mg Oral TID   mirtazapine   15 mg Oral QHS   multivitamin with minerals  1 tablet Oral Daily   nicotine   14 mg Transdermal Daily   pantoprazole   40 mg Oral QHS   polyethylene glycol  17 g Oral Daily   propranolol   20 mg Oral BID   senna  2 tablet Oral Daily   thiamine   100 mg Oral Daily   valbenazine   40 mg Oral Daily   Continuous Infusions: Diet: Diet Order              DIET DYS 3 Room service appropriate? Yes with Assist; Fluid consistency: Thin  Diet effective now

## 2024-07-24 ENCOUNTER — Other Ambulatory Visit: Payer: Self-pay

## 2024-07-24 DIAGNOSIS — S065XAA Traumatic subdural hemorrhage with loss of consciousness status unknown, initial encounter: Secondary | ICD-10-CM

## 2024-07-24 MED ORDER — AMLODIPINE BESYLATE 10 MG PO TABS
10.0000 mg | ORAL_TABLET | Freq: Every day | ORAL | Status: DC
Start: 2024-07-24 — End: 2024-07-31
  Administered 2024-07-24 – 2024-07-30 (×5): 10 mg via ORAL
  Filled 2024-07-24 (×7): qty 1

## 2024-07-24 NOTE — Progress Notes (Signed)
 Occupational Therapy Treatment Patient Details Name: Ernest Romero MRN: [id number] DOB: 10-30-1965 Today's Date: 07/24/2024   History of present illness Pt is a 58 y.o. male presenting 10/14 from [facility] where he was admitted 9/28 for unresponsive episode in the jail. 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 On CIWA protocol. Transferred to [facility] for further management of craniotomy and flap. PMH alcohol use disorder, psychiatric disorder, HTN   OT comments  Patient seen with PT due to potential agitation with patient having no episodes this session. Patient agreeable to OT/PT treatment but limited to ambulation, LB dressing, and grooming tasks. Patient demonstrating good gains with OT treatment.  Patient will benefit from continued inpatient follow up therapy, <3 hours/day.  Acute OT to continue to follow to address established goals to facilitate DC to next venue of care.        If plan is discharge home, recommend the following:  Supervision due to cognitive status;Direct supervision/assist for medications management;Assistance with cooking/housework;Direct supervision/assist for financial management;Assist for transportation;A little help with walking and/or transfers;A little help with bathing/dressing/bathroom   Equipment Recommendations  Other (comment) (defer)    Recommendations for Other Services      Precautions / Restrictions Precautions Precautions: Fall Recall of Precautions/Restrictions: Impaired Precaution/Restrictions Comments: helmet with mobility Restrictions Weight Bearing Restrictions Per Provider Order: No       Mobility Bed Mobility Overal bed mobility: Needs Assistance Bed Mobility: Supine to Sit, Rolling Rolling: Supervision   Supine to sit: Contact guard Sit to supine: Supervision   General bed mobility comments: CGA to raise trunk, patient able to return to supine with no  assistance    Transfers Overall transfer level: Needs assistance Equipment used: Rolling walker (2 wheels) Transfers: Sit to/from Stand Sit to Stand: Min assist           General transfer comment: min assist to power up and for balance     Balance Overall balance assessment: Needs assistance   Sitting balance-Leahy Scale: Fair Sitting balance - Comments: EOB     Standing balance-Leahy Scale: Poor Standing balance comment: with helmet for mobility due to fall risk and decreased safety awarness, attention to environment                           ADL either performed or assessed with clinical judgement   ADL Overall ADL's : Needs assistance/impaired     Grooming: Wash/dry face;Minimal assistance;Sitting Grooming Details (indicate cue type and reason): to complete             Lower Body Dressing: Supervision/safety;Sitting/lateral leans Lower Body Dressing Details (indicate cue type and reason): socks               General ADL Comments: limited session due to complaints of pain    Extremity/Trunk Assessment              Vision       Perception     Praxis     Communication Communication Communication: No apparent difficulties   Cognition Arousal: Alert Behavior During Therapy: Flat affect Cognition: No family/caregiver present to determine baseline, Cognition impaired, Rancho level     Awareness: Intellectual awareness impaired, Online awareness impaired Memory impairment (select all impairments): Short-term memory, Declarative long-term memory, Non-declarative long-term memory Attention impairment (select first level of impairment): Focused attention Executive functioning impairment (select all impairments): Organization, Sequencing, Reasoning,  Problem solving                 [email or web address] Scales of Cognitive Functioning: Confused, Inappropriate Non-Agitated: Maximal Assistance [V] Following commands: Impaired Following  commands impaired: Only follows one step commands consistently, Follows one step commands with increased time      Cueing   Cueing Techniques: Verbal cues, Visual cues  Exercises      Shoulder Instructions       General Comments Seen with PT due to potential agitation    Pertinent Vitals/ Pain       Pain Assessment Pain Assessment: Faces Faces Pain Scale: Hurts even more Pain Location: back and abdomen Pain Descriptors / Indicators: Discomfort Pain Intervention(s): RN gave pain meds during session, Monitored during session, Limited activity within patient's tolerance  Home Living                                          Prior Functioning/Environment              Frequency  Min 2X/week        Progress Toward Goals  OT Goals(current goals can now be found in the care plan section)  Progress towards OT goals: Progressing toward goals  Acute Rehab OT Goals Patient Stated Goal: none stated OT Goal Formulation: With patient Time For Goal Achievement: 08/04/24 Potential to Achieve Goals: Fair ADL Goals Pt Will Perform Grooming: with min assist;standing Pt Will Perform Lower Body Dressing: with min assist;sit to/from stand Pt Will Transfer to Toilet: with contact guard assist Pt Will Perform Toileting - Clothing Manipulation and hygiene: with min assist Additional ADL Goal #1: pt will follow 2 step commands min cues.  Plan      Co-evaluation    PT/OT/SLP Co-Evaluation/Treatment: Yes Reason for Co-Treatment: Necessary to address cognition/behavior during functional activity PT goals addressed during session: Mobility/safety with mobility OT goals addressed during session: ADL's and self-care      AM-PAC OT 6 Clicks Daily Activity     Outcome Measure   Help from another person eating meals?: A Little Help from another person taking care of personal grooming?: A Little Help from another person toileting, which includes using toliet,  bedpan, or urinal?: A Little Help from another person bathing (including washing, rinsing, drying)?: A Little Help from another person to put on and taking off regular upper body clothing?: A Little Help from another person to put on and taking off regular lower body clothing?: A Little 6 Click Score: 18    End of Session Equipment Utilized During Treatment: Gait belt;Rolling walker (2 wheels)  OT Visit Diagnosis: Other abnormalities of gait and mobility (R26.89);Muscle weakness (generalized) (M62.81);Other symptoms and signs involving the nervous system (R29.898);Cognitive communication deficit (R41.841);Other symptoms and signs involving cognitive function   Activity Tolerance Patient tolerated treatment well   Patient Left in bed;with call bell/phone within reach;with bed alarm set;with nursing/sitter in room   Nurse Communication Mobility status        Time: 9067-9046 OT Time Calculation (min): 21 min  Charges: OT General Charges $OT Visit: 1 Visit  Dick Romero, OTA Acute Rehabilitation Services  Office [phone]   Ernest Romero 07/24/2024, 1:58 PM

## 2024-07-24 NOTE — Progress Notes (Signed)
 Physical Therapy Treatment Patient Details Name: Ernest Romero MRN: [id number] DOB: Aug 15, 1966 Today's Date: 07/24/2024   History of Present Illness Pt is a 58 y.o. male presenting 10/14 from [facility] where he was admitted 9/28 for unresponsive episode in the jail. 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 On CIWA protocol. Transferred to [facility] for further management of craniotomy and flap. PMH alcohol use disorder, psychiatric disorder, HTN    PT Comments  Patient seen for co-treat with OT for participation and in case agitation.  No agitation this visit and participated with light encouragement following pain med administration per RN.  He needed some assist for balance on EOB while taking his meds due to posterior bias, though sitting and changing socks with OT no LOB.  Ambulated with RW into hallway with A for turns and initial assist for balance though demonstrating shuffling steps with poor clearance.  PT will continue to follow.  Current follow up recommendations remain appropriate.    If plan is discharge home, recommend the following: A lot of help with bathing/dressing/bathroom;A lot of help with walking and/or transfers;Assistance with cooking/housework;Direct supervision/assist for medications management;Direct supervision/assist for financial management;Assist for transportation;Help with stairs or ramp for entrance;Supervision due to cognitive status   Can travel by private vehicle     No  Equipment Recommendations  Other (comment) (TBA)    Recommendations for Other Services       Precautions / Restrictions Precautions Precautions: Fall Recall of Precautions/Restrictions: Impaired Precaution/Restrictions Comments: helmet with mobility     Mobility  Bed Mobility Overal bed mobility: Needs Assistance Bed Mobility: Supine to Sit, Rolling Rolling: Supervision   Supine to sit: Contact guard Sit  to supine: Supervision   General bed mobility comments: able to sit up with some A for balance initially, to supine cues for positioning    Transfers Overall transfer level: Needs assistance Equipment used: Rolling walker (2 wheels) Transfers: Sit to/from Stand Sit to Stand: Min assist           General transfer comment: up to stand with A for balance with RW    Ambulation/Gait Ambulation/Gait assistance: Contact guard assist Gait Distance (Feet): 110 Feet Assistive device: Rolling walker (2 wheels) (helmet for missing cranial flap) Gait Pattern/deviations: Step-through pattern, Shuffle       General Gait Details: tall posture with good balance using RW but short shuffling steps   Stairs             Wheelchair Mobility     Tilt Bed    Modified Rankin (Stroke Patients Only)       Balance Overall balance assessment: Needs assistance   Sitting balance-Leahy Scale: Fair Sitting balance - Comments: leans posterior at times when taking meds, but static balance unaided     Standing balance-Leahy Scale: Poor Standing balance comment: with helmet for mobility due to fall risk and decreased safety awarness, attention to environment                            Communication Communication Communication: No apparent difficulties  Cognition Arousal: Alert Behavior During Therapy: Flat affect   PT - Cognitive impairments: Awareness, Safety/Judgement, Problem solving                   Rancho Levels of Cognitive Functioning Rancho Los Amigos Scales of Cognitive Functioning: Confused, Inappropriate Non-Agitated: Maximal Assistance [place]  Amigos Scales of Cognitive Functioning: Confused, Inappropriate Non-Agitated: Maximal Assistance [V] PT - Cognition Comments: verbalizes needs well, needing coaxing at times to participate Following commands: Impaired Following commands impaired: Only follows one step commands consistently, Follows one step  commands with increased time    Cueing Cueing Techniques: Verbal cues, Visual cues  Exercises      General Comments General comments (skin integrity, edema, etc.): safety sitter present; able to read back of book when in supine; about half paragraph prior to losing focus      Pertinent Vitals/Pain Pain Assessment Pain Assessment: Faces Faces Pain Scale: Hurts even more Pain Location: back and abdomen Pain Descriptors / Indicators: Discomfort Pain Intervention(s): RN gave pain meds during session    Home Living                          Prior Function            PT Goals (current goals can now be found in the care plan section) Progress towards PT goals: Progressing toward goals    Frequency    Min 2X/week      PT Plan      Co-evaluation PT/OT/SLP Co-Evaluation/Treatment: Yes Reason for Co-Treatment: Necessary to address cognition/behavior during functional activity PT goals addressed during session: Mobility/safety with mobility        AM-PAC PT 6 Clicks Mobility   Outcome Measure  Help needed turning from your back to your side while in a flat bed without using bedrails?: None Help needed moving from lying on your back to sitting on the side of a flat bed without using bedrails?: A Little Help needed moving to and from a bed to a chair (including a wheelchair)?: A Little Help needed standing up from a chair using your arms (e.g., wheelchair or bedside chair)?: A Little Help needed to walk in hospital room?: A Little Help needed climbing 3-5 steps with a railing? : Total 6 Click Score: 17    End of Session Equipment Utilized During Treatment: Gait belt;Other (comment) (helmet) Activity Tolerance: Patient limited by fatigue Patient left: in bed;with call bell/phone within reach;with nursing/sitter in room   PT Visit Diagnosis: Other symptoms and signs involving the nervous system (R29.898);Muscle weakness (generalized) (M62.81)     Time:  9067-9046 PT Time Calculation (min) (ACUTE ONLY): 21 min  Charges:    $Gait Training: 8-22 mins PT General Charges $$ ACUTE PT VISIT: 1 Visit                     Micheline Portal, PT Acute Rehabilitation Services Office:[phone] 07/24/2024    Montie Portal 07/24/2024, 12:33 PM

## 2024-07-24 NOTE — Plan of Care (Signed)

## 2024-07-24 NOTE — Progress Notes (Addendum)
 Progress Note    Ernest Romero   FMW:[id number]  DOB: 07-14-1966  DOA: 07/15/2024     9 PCP: [name]  Initial CC: Transferred from [facility] for bone flap  Hospital Course: Ernest Romero is a 58 year old male with PMH alcohol abuse, bipolar and panic disorder.   Patient was admitted on June 30, 2024 at [facility] hospital after patient was found to be unresponsive in the jail.  CT of the head showed large subdural hematoma and patient underwent left frontoparietal craniotomy with evacuation of the subdural hematoma and placement of a drain at [facility] on 06/30/2024 which was eventually removed.   He was transferred from [facility] to [facility] for flap as per neurosurgery. Patient was on alcohol withdrawal protocol with phenobarbital which was tapered off. Patient is also on medications for his bipolar 1 panic disorder and also was noticed to be having frequent attacks of panic and agitation requiring multiple doses of sedation.     Assessment & Plan:  Subdural hematoma  - s/p frontoparietal craniotomy indication at [facility] hospital on 06/22/2024: Transferred to [facility] for need for flap - Seen by neurosurgery/Dr. Rosslyn, bone flap is ordered, per neurosurgery, plan to take him to the OR once that arrives   Bipolar disorder  - Evaluated by psychiatry -Was having frequent panic attacks  - Continue Klonopin , lithium , Latuda  - Haldol  as needed - has been under IVC over past week due to behaviors upon transfer from [facility]; will discuss with staff if necessary to renew  Social determinants - Due to acute changes patient does not have decision-making capacity at this time - Patient's support counselor [name] has been visiting. [phone]) - states does not have kids, his spouse passed away. Has sister, estranged. Will need guardianship if noone to assume care at discharge  - TOC aware and following  HTN - Continue amlodipine    History of alcohol abuse -  Was treated with  tapering dose of phenobarbital at [facility].  Not withdrawing anymore  Interval History:  No events overnight. Sitter bedside. Resting comfortably in bed.    Antimicrobials:   DVT prophylaxis:  SCDs Start: 07/16/24 0142   Code Status:   Code Status: Full Code  Mobility Assessment (Last 72 Hours)     Mobility Assessment     Row Name 07/24/24 1300 07/24/24 1232 07/24/24 0945 07/23/24 2050 07/23/24 1800   Does the patient have exclusion criteria? -- -- No - Perform mobility assessment No - Perform mobility assessment --   Mobility Assessment Exclusion Criteria -- -- No exclusion criteria present, perform mobility assessment No exclusion criteria present, perform mobility assessment --   What is the highest level of mobility based on the mobility assessment? Level 4 (Ambulates with assistance) - Balance while stepping forward/back - Complete Level 4 (Ambulates with assistance) - Balance while stepping forward/back - Complete Level 4 (Ambulates with assistance) - Balance while stepping forward/back - Complete Level 4 (Ambulates with assistance) - Balance while stepping forward/back - Complete Level 4 (Ambulates with assistance) - Balance while stepping forward/back - Complete   Is the above level different from baseline mobility prior to current illness? -- -- Yes - Recommend PT order Yes - Recommend PT order --    Row Name 07/23/24 1717 07/23/24 1600 07/23/24 0945 07/22/24 2149 07/22/24 1015   Does the patient have exclusion criteria? -- -- No - Perform mobility assessment No - Perform mobility assessment No - Perform mobility assessment   Mobility Assessment Exclusion Criteria -- --  No exclusion criteria present, perform mobility assessment No exclusion criteria present, perform mobility assessment --   What is the highest level of mobility based on the mobility assessment? Level 4 (Ambulates with assistance) - Balance while stepping forward/back - Complete Level 4 (Ambulates with assistance) -  Balance while stepping forward/back - Complete Level 4 (Ambulates with assistance) - Balance while stepping forward/back - Complete Level 4 (Ambulates with assistance) - Balance while stepping forward/back - Complete Level 4 (Ambulates with assistance) - Balance while stepping forward/back - Complete   Is the above level different from baseline mobility prior to current illness? -- -- Yes - Recommend PT order Yes - Recommend PT order Yes - Recommend PT order    Row Name 07/21/24 2100 07/21/24 1706         Does the patient have exclusion criteria? No - Perform mobility assessment No - Perform mobility assessment      What is the highest level of mobility based on the mobility assessment? Level 4 (Ambulates with assistance) - Balance while stepping forward/back - Complete Level 1 (Bedfast) - Unable to balance while sitting on edge of bed      Is the above level different from baseline mobility prior to current illness? Yes - Recommend PT order Yes - Recommend PT order         Barriers to discharge: None Disposition Plan: TBD HH orders placed:  Status is: Inpatient  Objective: Blood pressure (!) 149/96, pulse 64, temperature 98.4 F (36.9 C), temperature source Oral, resp. rate 18, height 5' 10 (1.778 m), weight 67.5 kg, SpO2 100%.  Examination:  Physical Exam Constitutional:      General: He is not in acute distress.    Appearance: Normal appearance.  HENT:     Head:     Comments: Left frontoparietal craniotomy noted.  Surgical incision well-healed    Mouth/Throat:     Mouth: Mucous membranes are moist.  Eyes:     Extraocular Movements: Extraocular movements intact.  Cardiovascular:     Rate and Rhythm: Normal rate and regular rhythm.  Pulmonary:     Effort: Pulmonary effort is normal. No respiratory distress.     Breath sounds: Normal breath sounds. No wheezing.  Abdominal:     General: Bowel sounds are normal. There is no distension.     Palpations: Abdomen is soft.      Tenderness: There is no abdominal tenderness.  Musculoskeletal:        General: Normal range of motion.     Cervical back: Normal range of motion and neck supple.  Skin:    General: Skin is warm and dry.  Neurological:     Mental Status: He is alert.     Comments: Slowed mentation noted.  Psychiatric:     Comments: Cooperative behavior      Consultants:  Psychiatry Neurosurgery  Procedures:    Data Reviewed: No results found for this or any previous visit (from the past 24 hours).  I have reviewed pertinent nursing notes, vitals, labs, and images as necessary. I have ordered labwork to follow up on as indicated.  I have reviewed the last notes from staff over past 24 hours. I have discussed patient's care plan and test results with nursing staff, CM/SW, and other staff as appropriate.  Old records reviewed in assessment of this patient  Time spent: Greater than 50% of the 55 minute visit was spent in counseling/coordination of care for the patient as laid out in the A&P.  LOS: 9 days   Alm Apo, MD [facility] 07/24/2024, 4:45 PM

## 2024-07-24 NOTE — Plan of Care (Signed)
 Pt fully oriented today but still has delayed responses. Sitter at bedside for impulsiveness.   Problem: Education: Goal: Knowledge of General Education information will improve Description: Including pain rating scale, medication(s)/side effects and non-pharmacologic comfort measures Outcome: Progressing   Problem: Health Behavior/Discharge Planning: Goal: Ability to manage health-related needs will improve Outcome: Progressing   Problem: Clinical Measurements: Goal: Ability to maintain clinical measurements within normal limits will improve Outcome: Progressing Goal: Will remain free from infection Outcome: Progressing Goal: Diagnostic test results will improve Outcome: Progressing Goal: Respiratory complications will improve Outcome: Progressing Goal: Cardiovascular complication will be avoided Outcome: Progressing   Problem: Nutrition: Goal: Adequate nutrition will be maintained Outcome: Progressing   Problem: Coping: Goal: Level of anxiety will decrease Outcome: Progressing   Problem: Elimination: Goal: Will not experience complications related to urinary retention Outcome: Progressing   Problem: Safety: Goal: Ability to remain free from injury will improve Outcome: Progressing   Problem: Skin Integrity: Goal: Risk for impaired skin integrity will decrease Outcome: Progressing   Problem: Activity: Goal: Risk for activity intolerance will decrease Outcome: Not Progressing   Problem: Elimination: Goal: Will not experience complications related to bowel motility Outcome: Not Progressing   Problem: Pain Managment: Goal: General experience of comfort will improve and/or be controlled Outcome: Not Progressing

## 2024-07-24 NOTE — TOC Progression Note (Signed)
 Transition of Care [facility]) - Progression Note    Patient Details  Name: Ernest Romero MRN: [id number] Date of Birth: 1966/08/12  Transition of Care [facility] CM/SW Contact  Ernest JONELLE Joe, RN Phone Number: 07/24/2024, 4:53 PM  Clinical Narrative:    CM spoke with Dr. Patsy and patient will likely have neurosurgery in the next week to have bone flap repaired on his cranium.  I spoke with the patient at the bedside and he states that he was living alone prior to admission and has friend - Ernest Romero but no available family.  I spoke with the patient and he was able to answer my questions and was agreeable to rehabilitation post surgery.  I called and left a message with [facility] to have patient evaluated post-surgery for [facility] since facility has contract with patient's insurance provider.  Patient has no DME at home and was able to state his address and that he lived home alone prior to admission.  07/25/24 0857 - CM spoke with Ernest Romero on 07/24/24 at 1700 as requested that the facility review him on 07/25/24 for possible bed offer post-surgery.  MD updated.  CM and MSW will continue to follow up with him for needed SNF placement post-surgery this week by neurosurgery.                     Expected Discharge Plan and Services                                               Social Drivers of Health (SDOH) Interventions SDOH Screenings   Food Insecurity: Patient Unable To Answer (07/16/2024)  Housing: Unknown (07/16/2024)  Transportation Needs: Patient Unable To Answer (07/16/2024)  Utilities: Patient Unable To Answer (07/16/2024)  Alcohol Screen: Low Risk  (08/30/2022)  Depression (PHQ2-9): Medium Risk (08/30/2022)  Tobacco Use: Medium Risk (07/16/2024)    Readmission Risk Interventions    07/16/2024   12:23 PM  Readmission Risk Prevention Plan  Transportation Screening Complete  Medication Review (RN Care Manager) Complete  PCP or  Specialist appointment within 3-5 days of discharge Complete  HRI or Home Care Consult Complete  SW Recovery Care/Counseling Consult Complete  Palliative Care Screening Not Applicable  Skilled Nursing Facility Not Applicable

## 2024-07-25 DIAGNOSIS — F1910 Other psychoactive substance abuse, uncomplicated: Secondary | ICD-10-CM

## 2024-07-25 DIAGNOSIS — F99 Mental disorder, not otherwise specified: Secondary | ICD-10-CM

## 2024-07-25 DIAGNOSIS — F10239 Alcohol dependence with withdrawal, unspecified: Secondary | ICD-10-CM

## 2024-07-25 DIAGNOSIS — R410 Disorientation, unspecified: Secondary | ICD-10-CM

## 2024-07-25 DIAGNOSIS — Z556 Problems related to health literacy: Secondary | ICD-10-CM

## 2024-07-25 MED ORDER — HALOPERIDOL 5 MG PO TABS
5.0000 mg | ORAL_TABLET | Freq: Once | ORAL | Status: DC
  Filled 2024-07-25: qty 1

## 2024-07-25 MED ORDER — NICOTINE 14 MG/24HR TD PT24
14.0000 mg | MEDICATED_PATCH | Freq: Every day | TRANSDERMAL | Status: AC
  Administered 2024-07-26 – 2024-11-08 (×103): 14 mg via TRANSDERMAL
  Filled 2024-07-25 (×99): qty 1

## 2024-07-25 MED ORDER — LURASIDONE HCL 20 MG PO TABS
40.0000 mg | ORAL_TABLET | Freq: Two times a day (BID) | ORAL | Status: DC
  Administered 2024-07-25 – 2024-07-28 (×6): 40 mg via ORAL
  Filled 2024-07-25 (×8): qty 2

## 2024-07-25 MED ORDER — LITHIUM CARBONATE ER 450 MG PO TBCR
900.0000 mg | EXTENDED_RELEASE_TABLET | Freq: Every day | ORAL | Status: DC
  Administered 2024-07-25 – 2024-07-29 (×5): 900 mg via ORAL
  Filled 2024-07-25 (×7): qty 2

## 2024-07-25 NOTE — Plan of Care (Signed)

## 2024-07-25 NOTE — [place] FL2 (Signed)
 [place]  MEDICAID FL2 LEVEL OF CARE FORM     IDENTIFICATION  Patient Name: Ernest Romero Birthdate: Mar 15, 1966 Sex: male Admission Date (Current Location): 07/15/2024  [place] and [place] Number:  [name] [id number] Facility and Address:  [facility], [address] [place], [place] [address]      Provider Number: [id number]  Attending Physician Name and Address:  Patsy Lenis, MD  Relative Name and Phone Number:  [name], friend - [phone]    Current Level of Care: SNF (SNF) Recommended Level of Care: Skilled Nursing Facility Prior Approval Number:    Date Approved/Denied:   PASRR Number:    Discharge Plan: SNF    Current Diagnoses: Patient Active Problem List   Diagnosis Date Noted   Malnutrition of moderate degree 07/02/2024   Alcohol withdrawal (HCC) 07/02/2024   Hypophosphatemia 07/02/2024   Subdural hematoma (HCC) 06/30/2024   Behavior concern in adult 04/21/2024   Drug-seeking behavior 03/02/2024   Polysubstance abuse (HCC) 03/02/2024   Delusions (HCC) 04/15/2023   Cocaine abuse with cocaine-induced psychotic disorder, with delusions (HCC) 04/15/2023   Overweight with body mass index (BMI) of 28 to 28.9 in adult 08/30/2022   Essential hypertension 09/17/2018   Bipolar disorder (HCC) 04/22/2013    Orientation RESPIRATION BLADDER Height & Weight     Self, Time, Situation, Place  Normal Continent Weight: 67.5 kg Height:  5' 10 (177.8 cm)  BEHAVIORAL SYMPTOMS/MOOD NEUROLOGICAL BOWEL NUTRITION STATUS      Continent Diet (See discharge summary)  AMBULATORY STATUS COMMUNICATION OF NEEDS Skin   Limited Assist Verbally Other (Comment) (closed incision - Left side of head)                       Personal Care Assistance Level of Assistance  Bathing, Feeding, Dressing Bathing Assistance: Limited assistance Feeding assistance: Independent Dressing Assistance: Limited assistance     Functional Limitations Info   Sight, Hearing, Speech Sight Info: Adequate Hearing Info: Adequate Speech Info: Adequate    SPECIAL CARE FACTORS FREQUENCY  PT (By licensed PT), OT (By licensed OT)     PT Frequency: 5 x per week OT Frequency: 5 x per week            Contractures Contractures Info: Not present    Additional Factors Info  Code Status, Allergies, Psychotropic Code Status Info: full code Allergies Info: NKDA Psychotropic Info: Haldol          Current Medications (07/25/2024):  This is the current hospital active medication list Current Facility-Administered Medications  Medication Dose Route Frequency Provider Last Rate Last Admin   acetaminophen  (TYLENOL ) tablet 650 mg  650 mg Oral Q6H PRN Franky Redia SAILOR, MD   650 mg at 07/24/24 1559   Or   acetaminophen  (TYLENOL ) suppository 650 mg  650 mg Rectal Q6H PRN Franky Redia SAILOR, MD       amLODipine  (NORVASC ) tablet 10 mg  10 mg Oral Daily Patsy Lenis, MD   10 mg at 07/25/24 9177   Chlorhexidine Gluconate Cloth 2 % PADS 6 each  6 each Topical Daily Franky Redia SAILOR, MD   6 each at 07/25/24 9176   clonazePAM  (KLONOPIN ) tablet 1 mg  1 mg Oral BID Kakrakandy, Arshad N, MD   1 mg at 07/25/24 9177   folic acid  (FOLVITE ) tablet 1 mg  1 mg Oral Daily Kakrakandy, Arshad N, MD   1 mg at 07/25/24 9177   haloperidol  (HALDOL ) tablet 5 mg  5 mg Oral Once Fredia Dorothe HERO, MD       haloperidol  lactate (HALDOL ) injection 10 mg  10 mg Intramuscular Q6H PRN Faunce, Alina, DO   10 mg at 07/21/24 1303   Or   haloperidol  lactate (HALDOL ) injection 10 mg  10 mg Intravenous Q6H PRN Faunce, Alina, DO   10 mg at 07/24/24 2217   haloperidol  lactate (HALDOL ) injection 5 mg  5 mg Intramuscular Q6H PRN Faunce, Alina, DO       Or   haloperidol  lactate (HALDOL ) injection 5 mg  5 mg Intravenous Q6H PRN Faunce, Alina, DO   5 mg at 07/25/24 1050   lithium  carbonate (ESKALITH ) ER tablet 900 mg  900 mg Oral QHS Faunce, Alina, DO       lurasidone  (LATUDA ) tablet 40  mg  40 mg Oral BID Faunce, Alina, DO       nicotine (NICODERM CQ - dosed in mg/24 hours) patch 14 mg  14 mg Transdermal Daily Fredia Dorothe HERO, MD       sodium chloride  flush (NS) 0.9 % injection 10-40 mL  10-40 mL Intracatheter Q12H Franky Redia SAILOR, MD   10 mL at 07/25/24 9176   sodium chloride  flush (NS) 0.9 % injection 10-40 mL  10-40 mL Intracatheter PRN Franky Redia SAILOR, MD       thiamine  (VITAMIN B1) tablet 100 mg  100 mg Oral Daily Franky Redia SAILOR, MD   100 mg at 07/25/24 9177     Discharge Medications: Please see discharge summary for a list of discharge medications.  Relevant Imaging Results:  Relevant Lab Results:   Additional Information [id number]  Rosaline JONELLE Joe, RN

## 2024-07-25 NOTE — Progress Notes (Signed)
 Progress Note    Ernest Romero   FMW:[id number]  DOB: 06/21/1966  DOA: 07/15/2024     10 PCP: [name]  Initial CC: Transferred from [facility] for bone flap  Hospital Course: Mr. Rembold is a 58 year old male with PMH alcohol abuse, bipolar and panic disorder.   Patient was admitted on June 30, 2024 at [facility] hospital after patient was found to be unresponsive in the jail.  CT of the head showed large subdural hematoma and patient underwent left frontoparietal craniotomy with evacuation of the subdural hematoma and placement of a drain at [facility] on 06/30/2024 which was eventually removed.   He was transferred from [facility] to [facility] for flap as per neurosurgery. Patient was on alcohol withdrawal protocol with phenobarbital which was tapered off. Patient is also on medications for his bipolar 1 panic disorder and also was noticed to be having frequent attacks of panic and agitation requiring multiple doses of sedation.     Assessment & Plan:  Subdural hematoma  - s/p frontoparietal craniotomy indication at [facility] hospital on 06/22/2024: Transferred to [facility] for need for flap - Seen by neurosurgery/Dr. [place], bone flap is ordered. Tentative plan for surgery on 10/25   Bipolar disorder  Lacks decision making capacity - Evaluated by psychiatry - Continue Klonopin , lithium , Latuda  - Haldol  as needed - continue sitter for safety and re-direction - Appreciate capacity evaluation by psychiatry; if patient does attempt to ever leave, would need to be IVC'd  - Further medication adjustments per psychiatry - TOC assisting with disposition planning in setting of lacking capacity  Social determinants - lacks decision making capacity - Patient's support counselor [name] has been visiting intermittently. [phone]) - states does not have kids, his spouse passed away. Has sister, estranged. Will need guardianship if noone to assume care at discharge  - TOC aware and  following  HTN - Continue amlodipine    History of alcohol abuse -  Was treated with tapering dose of phenobarbital at [facility].  Not withdrawing anymore  Interval History:  No events overnight. Sitter bedside. Resting comfortably in bed.  Later in the morning became more agitated requiring Haldol . Flap arriving soon and neurosurgery planning on repair on Saturday.   Antimicrobials:   DVT prophylaxis:  SCDs Start: 07/16/24 0142   Code Status:   Code Status: Full Code  Mobility Assessment (Last 72 Hours)     Mobility Assessment     Row Name 07/25/24 1411 07/25/24 1102 07/25/24 0858 07/25/24 0822 07/25/24 0748   Does the patient have exclusion criteria? -- -- -- No - Perform mobility assessment --   Mobility Assessment Exclusion Criteria -- -- -- No exclusion criteria present, perform mobility assessment --   What is the highest level of mobility based on the mobility assessment? Level 4 (Ambulates with assistance) - Balance while stepping forward/back - Complete Level 4 (Ambulates with assistance) - Balance while stepping forward/back - Complete Level 4 (Ambulates with assistance) - Balance while stepping forward/back - Complete Level 4 (Ambulates with assistance) - Balance while stepping forward/back - Complete Level 4 (Ambulates with assistance) - Balance while stepping forward/back - Complete   Is the above level different from baseline mobility prior to current illness? -- -- -- Yes - Recommend PT order --    Row Name 07/24/24 2300 07/24/24 1300 07/24/24 1232 07/24/24 0945 07/23/24 2050   Does the patient have exclusion criteria? No - Perform mobility assessment -- -- No - Perform mobility assessment No -  Perform mobility assessment   Mobility Assessment Exclusion Criteria -- -- -- No exclusion criteria present, perform mobility assessment No exclusion criteria present, perform mobility assessment   What is the highest level of mobility based on the mobility assessment? Level 4  (Ambulates with assistance) - Balance while stepping forward/back - Complete Level 4 (Ambulates with assistance) - Balance while stepping forward/back - Complete Level 4 (Ambulates with assistance) - Balance while stepping forward/back - Complete Level 4 (Ambulates with assistance) - Balance while stepping forward/back - Complete Level 4 (Ambulates with assistance) - Balance while stepping forward/back - Complete   Is the above level different from baseline mobility prior to current illness? Yes - Recommend PT order -- -- Yes - Recommend PT order Yes - Recommend PT order    Row Name 07/23/24 1800 07/23/24 1717 07/23/24 1600 07/23/24 0945 07/22/24 2149   Does the patient have exclusion criteria? -- -- -- No - Perform mobility assessment No - Perform mobility assessment   Mobility Assessment Exclusion Criteria -- -- -- No exclusion criteria present, perform mobility assessment No exclusion criteria present, perform mobility assessment   What is the highest level of mobility based on the mobility assessment? Level 4 (Ambulates with assistance) - Balance while stepping forward/back - Complete Level 4 (Ambulates with assistance) - Balance while stepping forward/back - Complete Level 4 (Ambulates with assistance) - Balance while stepping forward/back - Complete Level 4 (Ambulates with assistance) - Balance while stepping forward/back - Complete Level 4 (Ambulates with assistance) - Balance while stepping forward/back - Complete   Is the above level different from baseline mobility prior to current illness? -- -- -- Yes - Recommend PT order Yes - Recommend PT order      Barriers to discharge: None Disposition Plan: TBD HH orders placed:  Status is: Inpatient  Objective: Blood pressure (!) 130/91, pulse (!) 57, temperature 98.7 F (37.1 C), temperature source Oral, resp. rate 18, height 5' 10 (1.778 m), weight 67.5 kg, SpO2 98%.  Examination:  Physical Exam Constitutional:      General: He is not in  acute distress.    Appearance: Normal appearance.  HENT:     Head:     Comments: Left frontoparietal craniotomy noted.  Surgical incision well-healed    Mouth/Throat:     Mouth: Mucous membranes are moist.  Eyes:     Extraocular Movements: Extraocular movements intact.  Cardiovascular:     Rate and Rhythm: Normal rate and regular rhythm.  Pulmonary:     Effort: Pulmonary effort is normal. No respiratory distress.     Breath sounds: Normal breath sounds. No wheezing.  Abdominal:     General: Bowel sounds are normal. There is no distension.     Palpations: Abdomen is soft.     Tenderness: There is no abdominal tenderness.  Musculoskeletal:        General: Normal range of motion.     Cervical back: Normal range of motion and neck supple.  Skin:    General: Skin is warm and dry.  Neurological:     Mental Status: He is alert.     Comments: Slowed mentation noted.  Psychiatric:     Comments: Cooperative behavior      Consultants:  Psychiatry Neurosurgery  Procedures:    Data Reviewed: No results found for this or any previous visit (from the past 24 hours).  I have reviewed pertinent nursing notes, vitals, labs, and images as necessary. I have ordered labwork to follow up on as  indicated.  I have reviewed the last notes from staff over past 24 hours. I have discussed patient's care plan and test results with nursing staff, CM/SW, and other staff as appropriate.  Old records reviewed in assessment of this patient  Time spent: Greater than 50% of the 55 minute visit was spent in counseling/coordination of care for the patient as laid out in the A&P.   LOS: 10 days   Alm Apo, MD [facility] 07/25/2024, 2:13 PM
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
## 2024-07-25 NOTE — TOC Progression Note (Addendum)
 Transition of Care [facility]) - Progression Note    Patient Details  Name: Ernest Romero MRN: [id number] Date of Birth: 1966/09/29  [facility] CM/SW Contact  Rosaline JONELLE Joe, RN Phone Number: 07/25/2024, 3:22 PM  Clinical Narrative:    CM spoke with MD and patient has tentative neurosurgery scheduled for Saturday, 07/27/2024 for bone flap replacement.  [facility] plans to assess the patient at the bedside for potential bed offer.  Patient's IVC was removed this week.  Safety sitter remains at the bedside.  Patient is pending psychiatry evaluation for capacity per MD at this time.                     Expected Discharge Plan and Services                                               Social Drivers of Health (SDOH) Interventions SDOH Screenings   Food Insecurity: Patient Unable To Answer (07/16/2024)  Housing: Unknown (07/16/2024)  Transportation Needs: Patient Unable To Answer (07/16/2024)  Utilities: Patient Unable To Answer (07/16/2024)  Alcohol Screen: Low Risk  (08/30/2022)  Depression (PHQ2-9): Medium Risk (08/30/2022)  Tobacco Use: Medium Risk (07/16/2024)    Readmission Risk Interventions    07/16/2024   12:23 PM  Readmission Risk Prevention Plan  Transportation Screening Complete  Medication Review (RN Care Manager) Complete  PCP or Specialist appointment within 3-5 days of discharge Complete  HRI or Home Care Consult Complete  SW Recovery Care/Counseling Consult Complete  Palliative Care Screening Not Applicable  Skilled Nursing Facility Not Applicable

## 2024-07-25 NOTE — Telephone Encounter (Signed)
 Cranioplasty is scheduled for 07/27/24

## 2024-07-25 NOTE — Progress Notes (Signed)
 Physical Therapy Treatment Patient Details Name: Ernest Romero MRN: [id number] DOB: 04-04-1966 Today's Date: 07/25/2024   History of Present Illness Pt is a 58 y.o. male presenting 10/14 from [facility] where he was admitted 9/28 for unresponsive episode in the jail. 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 On CIWA protocol. Transferred to [facility] for further management of craniotomy and flap. PMH alcohol use disorder, psychiatric disorder, HTN    PT Comments  Received pt sitting EOB, agitated and frustrated with MD who reported need for surgery this weekend. Pt wanting to go home and with no awareness of deficits. With redirection, pt agreed to go for walk. Performed transfers with RW and CGA and ambulated >546ft with RW and CGA/min A with cues for RW safety and to avoid running into obstacles on R side. Pt able to navigate over uneven surfaces (fall mats in room) before NT could pick them up. Pt fatigued after walk and transitioned into supine with min A to prevent pt from hitting his head on bedrail. Currently recommend continued therapy (<3 hours/day). Acute PT to cont to follow.     If plan is discharge home, recommend the following: A lot of help with bathing/dressing/bathroom;Assistance with cooking/housework;Direct supervision/assist for medications management;Direct supervision/assist for financial management;Assist for transportation;Help with stairs or ramp for entrance;Supervision due to cognitive status;A little help with walking and/or transfers   Can travel by private vehicle     Yes  Equipment Recommendations  Other (comment) (TBD)    Recommendations for Other Services       Precautions / Restrictions Precautions Precautions: Fall Recall of Precautions/Restrictions: Impaired Precaution/Restrictions Comments: helmet with mobility Restrictions Weight Bearing Restrictions Per Provider Order: No      Mobility  Bed Mobility Overal bed mobility: Needs Assistance Bed Mobility: Rolling, Sit to Supine Rolling: Supervision     Sit to supine: Min assist   General bed mobility comments: sitting EOB upon entry, required min A when lying down to prevent pt from hitting head on siderail Patient Response: Flat affect  Transfers Overall transfer level: Needs assistance Equipment used: Rolling walker (2 wheels) Transfers: Sit to/from Stand Sit to Stand: Contact guard assist           General transfer comment: Stood from EOB with RW and CGA - quick to move    Ambulation/Gait Ambulation/Gait assistance: Editor, commissioning (Feet): 500 Feet Assistive device: Rolling walker (2 wheels) Gait Pattern/deviations: Shuffle, Step-to pattern, Decreased step length - right, Decreased step length - left, Decreased stride length, Narrow base of support Gait velocity: decreased Gait velocity interpretation: <1.8 ft/sec, indicate of risk for recurrent falls   General Gait Details: pt with poor awareness of RW safety and poor attention overall, coming close to running into obstacles on R side of hallway   Stairs             Wheelchair Mobility     Tilt Bed Tilt Bed Patient Response: Flat affect  Modified Rankin (Stroke Patients Only)       Balance Overall balance assessment: Needs assistance Sitting-balance support: Feet supported, Bilateral upper extremity supported Sitting balance-Leahy Scale: Fair Sitting balance - Comments: able to maintain static sitting balance EOB with supervision   Standing balance support: Bilateral upper extremity supported, Reliant on assistive device for balance (RW) Standing balance-Leahy Scale: Poor Standing balance comment: required CGA/min A for dynamic standing balance for safety  Communication Communication Communication: No apparent difficulties  Cognition Arousal: Alert Behavior During  Therapy: Flat affect   PT - Cognitive impairments: Awareness, Safety/Judgement, Problem solving                   Rancho Levels of Cognitive Functioning Rancho Los Amigos Scales of Cognitive Functioning: Confused, Inappropriate Non-Agitated: Maximal Assistance Rancho Mirant Scales of Cognitive Functioning: Confused, Inappropriate Non-Agitated: Maximal Assistance [V] PT - Cognition Comments: perseverating on going home Following commands: Impaired Following commands impaired: Only follows one step commands consistently, Follows one step commands with increased time    Cueing Cueing Techniques: Verbal cues, Visual cues, Tactile cues  Exercises      General Comments General comments (skin integrity, edema, etc.): sitter present in room providing +2 for safety but did not provide any physical assist      Pertinent Vitals/Pain Pain Assessment Pain Assessment: No/denies pain    Home Living                          Prior Function            PT Goals (current goals can now be found in the care plan section) Acute Rehab PT Goals Patient Stated Goal: none stated PT Goal Formulation: Patient unable to participate in goal setting Time For Goal Achievement: 08/04/24 Potential to Achieve Goals: Fair Progress towards PT goals: Progressing toward goals    Frequency    Min 2X/week      PT Plan      Co-evaluation              AM-PAC PT 6 Clicks Mobility   Outcome Measure  Help needed turning from your back to your side while in a flat bed without using bedrails?: None Help needed moving from lying on your back to sitting on the side of a flat bed without using bedrails?: A Little Help needed moving to and from a bed to a chair (including a wheelchair)?: A Little Help needed standing up from a chair using your arms (e.g., wheelchair or bedside chair)?: A Little Help needed to walk in hospital room?: A Little Help needed climbing 3-5 steps with a  railing? : A Lot 6 Click Score: 18    End of Session Equipment Utilized During Treatment: Gait belt;Other (comment) (helmet) Activity Tolerance: Patient tolerated treatment well Patient left: in bed;with call bell/phone within reach;with nursing/sitter in room Nurse Communication: Mobility status PT Visit Diagnosis: Other symptoms and signs involving the nervous system (R29.898);Muscle weakness (generalized) (M62.81)     Time: 8951-8941 PT Time Calculation (min) (ACUTE ONLY): 10 min  Charges:    $Therapeutic Activity: 8-22 mins PT General Charges $$ ACUTE PT VISIT: 1 Visit                     Therisa Stains PT, DPT Therisa HERO Zaunegger 07/25/2024, 11:04 AM

## 2024-07-26 MED ORDER — ACETAMINOPHEN 325 MG PO TABS
650.0000 mg | ORAL_TABLET | ORAL | Status: DC | PRN
  Administered 2024-07-26 – 2024-07-29 (×4): 650 mg via ORAL
  Filled 2024-07-26 (×5): qty 2

## 2024-07-26 NOTE — Plan of Care (Signed)
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
## 2024-07-26 NOTE — Progress Notes (Signed)
 Progress Note    Ernest Romero   FMW:[id number]  DOB: [birth year]  DOA: 07/15/2024     11 PCP: [name]  Initial CC: Transferred from [facility] for bone flap  Hospital Course: [name] is a 58 year old male with PMH alcohol abuse, bipolar and panic disorder.   Patient was admitted on June 30, 2024 at [facility] hospital after patient was found to be unresponsive in the jail.  CT of the head showed large subdural hematoma and patient underwent left frontoparietal craniotomy with evacuation of the subdural hematoma and placement of a drain at [facility] on 06/30/2024 which was eventually removed.   He was transferred from [facility] to [facility] for flap as per neurosurgery. Patient was on alcohol withdrawal protocol with phenobarbital which was tapered off. Patient is also on medications for his bipolar 1 panic disorder and also was noticed to be having frequent attacks of panic and agitation requiring multiple doses of sedation.     Assessment & Plan:  Subdural hematoma  - s/p frontoparietal craniotomy indication at [facility] hospital on 06/22/2024: Transferred to [facility] for need for flap - Seen by neurosurgery/Dr. Rosslyn, bone flap is ordered. Tentative plan for surgery on 10/25 - possible d/c to LTAC after surgery if accepted; Ernest Romero would sign patient in given lack of capacity    Bipolar disorder  Lacks decision making capacity - Evaluated by psychiatry - Continue Klonopin , lithium , Latuda  - Haldol  as needed - continue sitter for safety and re-direction - Appreciate capacity evaluation by psychiatry; if patient does attempt to ever leave, would need to be IVC'd  - Further medication adjustments per psychiatry - TOC assisting with disposition planning in setting of lacking capacity  Social determinants - lacks decision making capacity - Patient's support counselor Ernest Romero Raddle has been visiting intermittently. [phone]) - states does not have kids, his spouse passed  away. Has sister, estranged. Will need guardianship if noone to assume care at discharge  - TOC aware and following  HTN - Continue amlodipine    History of alcohol abuse -  Was treated with tapering dose of phenobarbital at [facility]  Not withdrawing anymore  Interval History:  No events overnight. Sitter bedside. Resting comfortably in bed.  Remaining calm and cooperative today. Tentative surgery tomorrow.  Disposition planning still ongoing.   Antimicrobials:   DVT prophylaxis:  SCDs Start: 07/16/24 0142   Code Status:   Code Status: Full Code  Mobility Assessment (Last 72 Hours)     Mobility Assessment     Row Name 07/25/24 2011 07/25/24 1411 07/25/24 1102 07/25/24 0858 07/25/24 9177   Does the patient have exclusion criteria? No - Perform mobility assessment -- -- -- No - Perform mobility assessment   Mobility Assessment Exclusion Criteria No exclusion criteria present, perform mobility assessment -- -- -- No exclusion criteria present, perform mobility assessment   What is the highest level of mobility based on the mobility assessment? Level 4 (Ambulates with assistance) - Balance while stepping forward/back - Complete Level 4 (Ambulates with assistance) - Balance while stepping forward/back - Complete Level 4 (Ambulates with assistance) - Balance while stepping forward/back - Complete Level 4 (Ambulates with assistance) - Balance while stepping forward/back - Complete Level 4 (Ambulates with assistance) - Balance while stepping forward/back - Complete   Is the above level different from baseline mobility prior to current illness? Yes - Recommend PT order -- -- -- Yes - Recommend PT order    Row Name 07/25/24 [phone] 07/24/24  2300 07/24/24 1300 07/24/24 1232 07/24/24 0945   Does the patient have exclusion criteria? -- No - Perform mobility assessment -- -- No - Perform mobility assessment   Mobility Assessment Exclusion Criteria -- -- -- -- No exclusion criteria present, perform  mobility assessment   What is the highest level of mobility based on the mobility assessment? Level 4 (Ambulates with assistance) - Balance while stepping forward/back - Complete Level 4 (Ambulates with assistance) - Balance while stepping forward/back - Complete Level 4 (Ambulates with assistance) - Balance while stepping forward/back - Complete Level 4 (Ambulates with assistance) - Balance while stepping forward/back - Complete Level 4 (Ambulates with assistance) - Balance while stepping forward/back - Complete   Is the above level different from baseline mobility prior to current illness? -- Yes - Recommend PT order -- -- Yes - Recommend PT order    Row Name 07/23/24 2050 07/23/24 1800 07/23/24 1717 07/23/24 1600     Does the patient have exclusion criteria? No - Perform mobility assessment -- -- --    Mobility Assessment Exclusion Criteria No exclusion criteria present, perform mobility assessment -- -- --    What is the highest level of mobility based on the mobility assessment? Level 4 (Ambulates with assistance) - Balance while stepping forward/back - Complete Level 4 (Ambulates with assistance) - Balance while stepping forward/back - Complete Level 4 (Ambulates with assistance) - Balance while stepping forward/back - Complete Level 4 (Ambulates with assistance) - Balance while stepping forward/back - Complete    Is the above level different from baseline mobility prior to current illness? Yes - Recommend PT order -- -- --       Barriers to discharge: None Disposition Plan: TBD HH orders placed:  Status is: Inpatient  Objective: Blood pressure 131/85, pulse 71, temperature 98.1 F (36.7 C), temperature source Oral, resp. rate 18, height 5' 10 (1.778 m), weight 67.5 kg, SpO2 99%.  Examination:  Physical Exam Constitutional:      General: He is not in acute distress.    Appearance: Normal appearance.  HENT:     Head:     Comments: Left frontoparietal craniotomy noted.  Surgical  incision well-healed    Mouth/Throat:     Mouth: Mucous membranes are moist.  Eyes:     Extraocular Movements: Extraocular movements intact.  Cardiovascular:     Rate and Rhythm: Normal rate and regular rhythm.  Pulmonary:     Effort: Pulmonary effort is normal. No respiratory distress.     Breath sounds: Normal breath sounds. No wheezing.  Abdominal:     General: Bowel sounds are normal. There is no distension.     Palpations: Abdomen is soft.     Tenderness: There is no abdominal tenderness.  Musculoskeletal:        General: Normal range of motion.     Cervical back: Normal range of motion and neck supple.  Skin:    General: Skin is warm and dry.  Neurological:     Mental Status: He is alert.     Comments: Slowed mentation noted.  Psychiatric:     Comments: Cooperative behavior      Consultants:  Psychiatry Neurosurgery  Procedures:    Data Reviewed: No results found for this or any previous visit (from the past 24 hours).  I have reviewed pertinent nursing notes, vitals, labs, and images as necessary. I have ordered labwork to follow up on as indicated.  I have reviewed the last notes from staff over past  24 hours. I have discussed patient's care plan and test results with nursing staff, CM/SW, and other staff as appropriate.  Old records reviewed in assessment of this patient  Time spent: Greater than 50% of the 55 minute visit was spent in counseling/coordination of care for the patient as laid out in the A&P.   LOS: 11 days   Alm Apo, MD [facility] 07/26/2024, 2:27 PM

## 2024-07-27 NOTE — Progress Notes (Signed)
 Progress Note    Ernest Romero   FMW:[id number]  DOB: 1966/02/13  DOA: 07/15/2024     12 PCP: [name]  Initial CC: Transferred from [facility] for bone flap  Hospital Course: Ernest Romero is a 58 year old male with PMH alcohol abuse, bipolar and panic disorder.   Patient was admitted on June 30, 2024 at [facility] hospital after patient was found to be unresponsive in the jail.  CT of the head showed large subdural hematoma and patient underwent left frontoparietal craniotomy with evacuation of the subdural hematoma and placement of a drain at [facility] on 06/30/2024 which was eventually removed.   He was transferred from [facility] to [facility] for flap as per neurosurgery. Patient was on alcohol withdrawal protocol with phenobarbital which was tapered off. Patient is also on medications for his bipolar 1 panic disorder and also was noticed to be having frequent attacks of panic and agitation requiring multiple doses of sedation.     Assessment & Plan:  Subdural hematoma  - s/p frontoparietal craniotomy indication at [facility] hospital on 06/22/2024: Transferred to [facility] for need for flap - Seen by neurosurgery/Dr. Rosslyn, bone flap is ordered. Tentative plan for surgery on 10/27 (Sat cancelled due to OR issues) - possible d/c to LTAC after surgery if accepted; Ernest Rams would sign patient in given lack of capacity    Bipolar disorder  Lacks decision making capacity - Evaluated by psychiatry - Continue Klonopin , lithium , Latuda  - Haldol  as needed - continue sitter for safety and re-direction - Appreciate capacity evaluation by psychiatry; if patient does attempt to ever leave, would need to be IVC'd  - Further medication adjustments per psychiatry - TOC assisting with disposition planning in setting of lacking capacity  Social determinants - lacks decision making capacity - Patient's support counselor Ernest Romero has been visiting intermittently. [phone]) - states does  not have kids, his spouse passed away. Has sister, estranged. Will need guardianship if noone to assume care at discharge  - TOC aware and following  HTN - Continue amlodipine    History of alcohol abuse -  Was treated with tapering dose of phenobarbital at [facility].  Not withdrawing anymore  Interval History:  No events overnight. Sitter bedside. Resting comfortably in bed.  Surgery canceled this morning due to issues in the OR and rescheduled tentatively for Monday.  Informed patient.  Antimicrobials:   DVT prophylaxis:  SCDs Start: 07/16/24 0142   Code Status:   Code Status: Full Code  Mobility Assessment (Last 72 Hours)     Mobility Assessment     Row Name 07/27/24 0850 07/26/24 2008 07/26/24 1041 07/25/24 2011 07/25/24 1411   Does the patient have exclusion criteria? No - Perform mobility assessment No - Perform mobility assessment No - Perform mobility assessment No - Perform mobility assessment --   Mobility Assessment Exclusion Criteria No exclusion criteria present, perform mobility assessment No exclusion criteria present, perform mobility assessment No exclusion criteria present, perform mobility assessment No exclusion criteria present, perform mobility assessment --   What is the highest level of mobility based on the mobility assessment? Level 4 (Ambulates with assistance) - Balance while stepping forward/back - Complete Level 4 (Ambulates with assistance) - Balance while stepping forward/back - Complete Level 4 (Ambulates with assistance) - Balance while stepping forward/back - Complete Level 4 (Ambulates with assistance) - Balance while stepping forward/back - Complete Level 4 (Ambulates with assistance) - Balance while stepping forward/back - Complete   Is the above  level different from baseline mobility prior to current illness? Yes - Recommend PT order Yes - Recommend PT order Yes - Recommend PT order Yes - Recommend PT order --    Row Name 07/25/24 1102 07/25/24 0858  07/25/24 0822 07/25/24 0748 07/24/24 2300   Does the patient have exclusion criteria? -- -- No - Perform mobility assessment -- No - Perform mobility assessment   Mobility Assessment Exclusion Criteria -- -- No exclusion criteria present, perform mobility assessment -- --   What is the highest level of mobility based on the mobility assessment? Level 4 (Ambulates with assistance) - Balance while stepping forward/back - Complete Level 4 (Ambulates with assistance) - Balance while stepping forward/back - Complete Level 4 (Ambulates with assistance) - Balance while stepping forward/back - Complete Level 4 (Ambulates with assistance) - Balance while stepping forward/back - Complete Level 4 (Ambulates with assistance) - Balance while stepping forward/back - Complete   Is the above level different from baseline mobility prior to current illness? -- -- Yes - Recommend PT order -- Yes - Recommend PT order      Barriers to discharge: None Disposition Plan: TBD HH orders placed:  Status is: Inpatient  Objective: Blood pressure (!) 123/97, pulse 61, temperature 97.8 F (36.6 C), temperature source Oral, resp. rate 18, height 5' 10 (1.778 m), weight 67.5 kg, SpO2 97%.  Examination:  Physical Exam Constitutional:      General: He is not in acute distress.    Appearance: Normal appearance.  HENT:     Head:     Comments: Left frontoparietal craniotomy noted.  Surgical incision well-healed    Mouth/Throat:     Mouth: Mucous membranes are moist.  Eyes:     Extraocular Movements: Extraocular movements intact.  Cardiovascular:     Rate and Rhythm: Normal rate and regular rhythm.  Pulmonary:     Effort: Pulmonary effort is normal. No respiratory distress.     Breath sounds: Normal breath sounds. No wheezing.  Abdominal:     General: Bowel sounds are normal. There is no distension.     Palpations: Abdomen is soft.     Tenderness: There is no abdominal tenderness.  Musculoskeletal:        General:  Normal range of motion.     Cervical back: Normal range of motion and neck supple.  Skin:    General: Skin is warm and dry.  Neurological:     Mental Status: He is alert.     Comments: Slowed mentation noted.  Psychiatric:     Comments: Cooperative behavior      Consultants:  Psychiatry Neurosurgery  Procedures:    Data Reviewed: No results found for this or any previous visit (from the past 24 hours).  I have reviewed pertinent nursing notes, vitals, labs, and images as necessary. I have ordered labwork to follow up on as indicated.  I have reviewed the last notes from staff over past 24 hours. I have discussed patient's care plan and test results with nursing staff, CM/SW, and other staff as appropriate.  Old records reviewed in assessment of this patient    LOS: 12 days   Alm Apo, MD [facility] 07/27/2024, 3:28 PM

## 2024-07-28 NOTE — Progress Notes (Signed)
 Progress Note    Ernest Romero   FMW:[id number]  DOB: 09/05/1966  DOA: 07/15/2024     13 PCP: [name]  Initial CC: Transferred from [facility] for bone flap  Hospital Course: Ernest Romero is a 58 year old male with PMH alcohol abuse, bipolar and panic disorder.   Patient was admitted on June 30, 2024 at [facility] hospital after patient was found to be unresponsive in the jail.  CT of the head showed large subdural hematoma and patient underwent left frontoparietal craniotomy with evacuation of the subdural hematoma and placement of a drain at [facility] on 06/30/2024 which was eventually removed.   He was transferred from [facility] to [facility] for flap as per neurosurgery. Patient was on alcohol withdrawal protocol with phenobarbital which was tapered off. Patient is also on medications for his bipolar 1 panic disorder and also was noticed to be having frequent attacks of panic and agitation requiring multiple doses of sedation.     Assessment & Plan:  Subdural hematoma  - s/p frontoparietal craniotomy indication at [facility] hospital on 06/22/2024: Transferred to [facility] for need for flap - Seen by neurosurgery/Dr. Rosslyn, bone flap is ordered. Tentative plan for surgery on 10/27 (Sat cancelled due to OR issues) - possible d/c to LTAC after surgery if accepted; Ernest Romero would sign patient in given lack of capacity    Bipolar disorder  Lacks decision making capacity - Evaluated by psychiatry - Continue Klonopin , lithium , Latuda  - Haldol  as needed - continue sitter for safety and re-direction - Appreciate capacity evaluation by psychiatry; if patient does attempt to ever leave, would need to be IVC'd  - Further medication adjustments per psychiatry - TOC assisting with disposition planning in setting of lacking capacity  Social determinants - lacks decision making capacity - Patient's support counselor Ernest Romero Raddle has been visiting intermittently. [phone]) - states does  not have kids, his spouse passed away. Has sister, estranged. Will need guardianship if noone to assume care at discharge  - TOC aware and following  HTN - Continue amlodipine    History of alcohol abuse -  Was treated with tapering dose of phenobarbital at [facility].  Not withdrawing anymore  Interval History:  Sleeping when seen.  Sitter at bedside.  He became agitated again this morning which appears to be an intermittent but daily thing.  Tentative plan for surgery tomorrow.  Antimicrobials:   DVT prophylaxis:  SCDs Start: 07/16/24 0142   Code Status:   Code Status: Full Code  Mobility Assessment (Last 72 Hours)     Mobility Assessment     Row Name 07/27/24 2003 07/27/24 1824 07/27/24 0850 07/26/24 2008 07/26/24 1041   Does the patient have exclusion criteria? No - Perform mobility assessment No - Perform mobility assessment No - Perform mobility assessment No - Perform mobility assessment No - Perform mobility assessment   Mobility Assessment Exclusion Criteria No exclusion criteria present, perform mobility assessment No exclusion criteria present, perform mobility assessment No exclusion criteria present, perform mobility assessment No exclusion criteria present, perform mobility assessment No exclusion criteria present, perform mobility assessment   What is the highest level of mobility based on the mobility assessment? Level 4 (Ambulates with assistance) - Balance while stepping forward/back - Complete Level 4 (Ambulates with assistance) - Balance while stepping forward/back - Complete Level 4 (Ambulates with assistance) - Balance while stepping forward/back - Complete Level 4 (Ambulates with assistance) - Balance while stepping forward/back - Complete Level 4 (Ambulates with assistance) -  Balance while stepping forward/back - Complete   Is the above level different from baseline mobility prior to current illness? Yes - Recommend PT order Yes - Recommend PT order Yes - Recommend PT  order Yes - Recommend PT order Yes - Recommend PT order    Row Name 07/25/24 2011 07/25/24 1411         Does the patient have exclusion criteria? No - Perform mobility assessment --      Mobility Assessment Exclusion Criteria No exclusion criteria present, perform mobility assessment --      What is the highest level of mobility based on the mobility assessment? Level 4 (Ambulates with assistance) - Balance while stepping forward/back - Complete Level 4 (Ambulates with assistance) - Balance while stepping forward/back - Complete      Is the above level different from baseline mobility prior to current illness? Yes - Recommend PT order --         Barriers to discharge: None Disposition Plan: TBD HH orders placed:  Status is: Inpatient  Objective: Blood pressure 104/72, pulse (!) 59, temperature 98.2 F (36.8 C), temperature source Oral, resp. rate 17, height 5' 10 (1.778 m), weight 67.5 kg, SpO2 98%.  Examination:  Physical Exam Constitutional:      General: He is not in acute distress.    Appearance: Normal appearance.  HENT:     Head:     Comments: Left frontoparietal craniotomy noted.  Surgical incision well-healed    Mouth/Throat:     Mouth: Mucous membranes are moist.  Eyes:     Extraocular Movements: Extraocular movements intact.  Cardiovascular:     Rate and Rhythm: Normal rate and regular rhythm.  Pulmonary:     Effort: Pulmonary effort is normal. No respiratory distress.     Breath sounds: Normal breath sounds. No wheezing.  Abdominal:     General: Bowel sounds are normal. There is no distension.     Palpations: Abdomen is soft.     Tenderness: There is no abdominal tenderness.  Musculoskeletal:        General: Normal range of motion.     Cervical back: Normal range of motion and neck supple.  Skin:    General: Skin is warm and dry.  Neurological:     Mental Status: He is alert.     Comments: Slowed mentation noted.  Psychiatric:     Comments: Cooperative  behavior      Consultants:  Psychiatry Neurosurgery  Procedures:    Data Reviewed: No results found for this or any previous visit (from the past 24 hours).  I have reviewed pertinent nursing notes, vitals, labs, and images as necessary. I have ordered labwork to follow up on as indicated.  I have reviewed the last notes from staff over past 24 hours. I have discussed patient's care plan and test results with nursing staff, CM/SW, and other staff as appropriate.  Old records reviewed in assessment of this patient    LOS: 13 days   Alm Apo, MD [facility] 07/28/2024, 11:35 AM

## 2024-07-28 NOTE — Plan of Care (Signed)

## 2024-07-28 NOTE — Progress Notes (Signed)
 Right [place] TLC placed 9/28. Notified Dr Patsy r/t removing central line. Received MD order to leave central line in place. Patient scheduled for cranioplasty tomorrow and need good IV access. Changed dressing,site WNL.

## 2024-07-29 ENCOUNTER — Inpatient Hospital Stay (HOSPITAL_COMMUNITY): Payer: MEDICAID

## 2024-07-29 ENCOUNTER — Other Ambulatory Visit: Payer: Self-pay

## 2024-07-29 ENCOUNTER — Inpatient Hospital Stay (HOSPITAL_COMMUNITY): Payer: MEDICAID | Admitting: Certified Registered"

## 2024-07-29 ENCOUNTER — Encounter (HOSPITAL_COMMUNITY): Payer: Self-pay | Admitting: Internal Medicine

## 2024-07-29 ENCOUNTER — Encounter (HOSPITAL_COMMUNITY): Payer: Self-pay | Source: Home / Self Care | Attending: Family Medicine

## 2024-07-29 DIAGNOSIS — M952 Other acquired deformity of head: Secondary | ICD-10-CM

## 2024-07-29 DIAGNOSIS — S065XAA Traumatic subdural hemorrhage with loss of consciousness status unknown, initial encounter: Secondary | ICD-10-CM | POA: Diagnosis present

## 2024-07-29 DIAGNOSIS — S020XXA Fracture of vault of skull, initial encounter for closed fracture: Secondary | ICD-10-CM

## 2024-07-29 DIAGNOSIS — I10 Essential (primary) hypertension: Secondary | ICD-10-CM

## 2024-07-29 DIAGNOSIS — F1090 Alcohol use, unspecified, uncomplicated: Secondary | ICD-10-CM

## 2024-07-29 DIAGNOSIS — F17210 Nicotine dependence, cigarettes, uncomplicated: Secondary | ICD-10-CM

## 2024-07-29 DIAGNOSIS — Z87891 Personal history of nicotine dependence: Secondary | ICD-10-CM

## 2024-07-29 DIAGNOSIS — F418 Other specified anxiety disorders: Secondary | ICD-10-CM

## 2024-07-29 HISTORY — PX: CRANIOPLASTY, WITH BONE FLAP REPLACEMENT: SHX7336

## 2024-07-29 LAB — POCT I-STAT 7, (LYTES, BLD GAS, ICA,H+H)
Acid-base deficit: 3 mmol/L — ABNORMAL HIGH (ref 0.0–2.0)
Bicarbonate: 21.3 mmol/L (ref 20.0–28.0)
Calcium, Ion: 1.23 mmol/L (ref 1.15–1.40)
HCT: 38 % — ABNORMAL LOW (ref 39.0–52.0)
Hemoglobin: 12.9 g/dL — ABNORMAL LOW (ref 13.0–17.0)
O2 Saturation: 100 %
Potassium: 3.9 mmol/L (ref 3.5–5.1)
Sodium: 138 mmol/L (ref 135–145)
TCO2: 22 mmol/L (ref 22–32)
pCO2 arterial: 34.9 mmHg (ref 32–48)
pH, Arterial: 7.393 (ref 7.35–7.45)
pO2, Arterial: 248 mmHg — ABNORMAL HIGH (ref 83–108)

## 2024-07-29 LAB — CBC
HCT: 43.4 % (ref 39.0–52.0)
Hemoglobin: 15 g/dL (ref 13.0–17.0)
MCH: 29.8 pg (ref 26.0–34.0)
MCHC: 34.6 g/dL (ref 30.0–36.0)
MCV: 86.3 fL (ref 80.0–100.0)
Platelets: 271 K/uL (ref 150–400)
RBC: 5.03 MIL/uL (ref 4.22–5.81)
RDW: 12.6 % (ref 11.5–15.5)
WBC: 11.8 K/uL — ABNORMAL HIGH (ref 4.0–10.5)
nRBC: 0 % (ref 0.0–0.2)

## 2024-07-29 LAB — TYPE AND SCREEN
ABO/RH(D): O NEG
Antibody Screen: NEGATIVE

## 2024-07-29 LAB — GLUCOSE, CAPILLARY
Glucose-Capillary: 130 mg/dL — ABNORMAL HIGH (ref 70–99)
Glucose-Capillary: 157 mg/dL — ABNORMAL HIGH (ref 70–99)

## 2024-07-29 LAB — MRSA NEXT GEN BY PCR, NASAL: MRSA by PCR Next Gen: DETECTED — AB

## 2024-07-29 LAB — POCT ACTIVATED CLOTTING TIME: Activated Clotting Time: 141 s

## 2024-07-29 LAB — LITHIUM LEVEL: Lithium Lvl: 1.07 mmol/L (ref 0.60–1.20)

## 2024-07-29 MED ORDER — LIDOCAINE-EPINEPHRINE 1 %-1:100000 IJ SOLN
INTRAMUSCULAR | Status: DC | PRN
  Administered 2024-07-29: 28 mL

## 2024-07-29 MED ORDER — ROCURONIUM BROMIDE 10 MG/ML (PF) SYRINGE
PREFILLED_SYRINGE | INTRAVENOUS | Status: DC | PRN
  Administered 2024-07-29: 50 mg via INTRAVENOUS
  Administered 2024-07-29: 10 mg via INTRAVENOUS

## 2024-07-29 MED ORDER — SODIUM CHLORIDE 0.9 % IV SOLN: INTRAVENOUS | Status: DC

## 2024-07-29 MED ORDER — CEFAZOLIN SODIUM-DEXTROSE 2-4 GM/100ML-% IV SOLN
2.0000 g | Freq: Three times a day (TID) | INTRAVENOUS | Status: AC
  Administered 2024-07-29 – 2024-07-30 (×3): 2 g via INTRAVENOUS
  Filled 2024-07-29 (×3): qty 100

## 2024-07-29 MED ORDER — FENTANYL CITRATE (PF) 100 MCG/2ML IJ SOLN: 25.0000 ug | INTRAMUSCULAR | Status: DC | PRN

## 2024-07-29 MED ORDER — CHLORHEXIDINE GLUCONATE 0.12 % MT SOLN
15.0000 mL | Freq: Once | OROMUCOSAL | Status: AC
  Administered 2024-07-29: 15 mL via OROMUCOSAL
  Filled 2024-07-29: qty 15

## 2024-07-29 MED ORDER — ROCURONIUM BROMIDE 10 MG/ML (PF) SYRINGE
PREFILLED_SYRINGE | INTRAVENOUS | Status: AC
  Filled 2024-07-29: qty 10

## 2024-07-29 MED ORDER — BUTALBITAL-APAP-CAFFEINE 50-325-40 MG PO TABS
2.0000 | ORAL_TABLET | ORAL | Status: DC | PRN
  Administered 2024-07-29 – 2024-07-30 (×3): 2 via ORAL
  Filled 2024-07-29 (×3): qty 2

## 2024-07-29 MED ORDER — DEXAMETHASONE SOD PHOSPHATE PF 10 MG/ML IJ SOLN
INTRAMUSCULAR | Status: DC | PRN
Start: 2024-07-29 — End: 2024-07-29
  Administered 2024-07-29: 10 mg via INTRAVENOUS

## 2024-07-29 MED ORDER — CLEVIDIPINE BUTYRATE 0.5 MG/ML IV EMUL: 0.0000 mg/h | INTRAVENOUS | Status: DC

## 2024-07-29 MED ORDER — SURGIFLO WITH THROMBIN (HEMOSTATIC MATRIX KIT) OPTIME
TOPICAL | Status: DC | PRN
  Administered 2024-07-29: 1 via TOPICAL

## 2024-07-29 MED ORDER — FENTANYL CITRATE (PF) 250 MCG/5ML IJ SOLN: INTRAMUSCULAR | Status: DC | PRN

## 2024-07-29 MED ORDER — FENTANYL CITRATE (PF) 50 MCG/ML IJ SOSY
25.0000 ug | PREFILLED_SYRINGE | INTRAMUSCULAR | Status: DC | PRN
Start: 2024-07-29 — End: 2024-07-30
  Administered 2024-07-29 – 2024-07-30 (×3): 25 ug via INTRAVENOUS
  Filled 2024-07-29 (×3): qty 1

## 2024-07-29 MED ORDER — ONDANSETRON HCL 4 MG/2ML IJ SOLN
4.0000 mg | INTRAMUSCULAR | Status: AC | PRN
  Administered 2024-08-01: 4 mg via INTRAVENOUS

## 2024-07-29 MED ORDER — PHENYLEPHRINE 80 MCG/ML (10ML) SYRINGE FOR IV PUSH (FOR BLOOD PRESSURE SUPPORT)
PREFILLED_SYRINGE | INTRAVENOUS | Status: DC | PRN
  Administered 2024-07-29: 80 ug via INTRAVENOUS
  Administered 2024-07-29: 160 ug via INTRAVENOUS
  Administered 2024-07-29: 80 ug via INTRAVENOUS

## 2024-07-29 MED ORDER — PROPOFOL 10 MG/ML IV BOLUS
INTRAVENOUS | Status: AC
Start: 2024-07-29 — End: 2024-07-29
  Filled 2024-07-29: qty 20

## 2024-07-29 MED ORDER — CEFAZOLIN SODIUM-DEXTROSE 2-4 GM/100ML-% IV SOLN
2.0000 g | Freq: Once | INTRAVENOUS | Status: AC
  Administered 2024-07-29: 2 g via INTRAVENOUS
  Filled 2024-07-29: qty 100

## 2024-07-29 MED ORDER — BACITRACIN ZINC 500 UNIT/GM EX OINT
TOPICAL_OINTMENT | CUTANEOUS | Status: AC
  Filled 2024-07-29: qty 28.35

## 2024-07-29 MED ORDER — LURASIDONE HCL 40 MG PO TABS
80.0000 mg | ORAL_TABLET | Freq: Every day | ORAL | Status: DC
  Administered 2024-07-30: 80 mg via ORAL
  Filled 2024-07-29 (×2): qty 2

## 2024-07-29 MED ORDER — AMISULPRIDE (ANTIEMETIC) 5 MG/2ML IV SOLN: 10.0000 mg | Freq: Once | INTRAVENOUS | Status: DC | PRN

## 2024-07-29 MED ORDER — VANCOMYCIN HCL 1000 MG IV SOLR
INTRAVENOUS | Status: AC
  Filled 2024-07-29: qty 40

## 2024-07-29 MED ORDER — ONDANSETRON HCL 4 MG/2ML IJ SOLN
INTRAMUSCULAR | Status: DC | PRN
Start: 2024-07-29 — End: 2024-07-29
  Administered 2024-07-29: 4 mg via INTRAVENOUS

## 2024-07-29 MED ORDER — LIDOCAINE-EPINEPHRINE 1 %-1:100000 IJ SOLN
INTRAMUSCULAR | Status: AC
  Filled 2024-07-29: qty 1

## 2024-07-29 MED ORDER — ONDANSETRON HCL 4 MG/2ML IJ SOLN: 4.0000 mg | Freq: Once | INTRAMUSCULAR | Status: DC | PRN

## 2024-07-29 MED ORDER — BACITRACIN ZINC 500 UNIT/GM EX OINT
TOPICAL_OINTMENT | CUTANEOUS | Status: DC | PRN
  Administered 2024-07-29: 1 via TOPICAL

## 2024-07-29 MED ORDER — DEXMEDETOMIDINE HCL IN NACL 80 MCG/20ML IV SOLN
INTRAVENOUS | Status: DC | PRN
  Administered 2024-07-29: 4 ug via INTRAVENOUS
  Administered 2024-07-29: 8 ug via INTRAVENOUS

## 2024-07-29 MED ORDER — LABETALOL HCL 5 MG/ML IV SOLN
10.0000 mg | INTRAVENOUS | Status: DC | PRN
  Administered 2024-07-31: 20 mg via INTRAVENOUS
  Administered 2024-08-01 – 2024-08-02 (×4): 10 mg via INTRAVENOUS
  Filled 2024-07-29 (×7): qty 4

## 2024-07-29 MED ORDER — 0.9 % SODIUM CHLORIDE (POUR BTL) OPTIME
TOPICAL | Status: DC | PRN
  Administered 2024-07-29: 2000 mL

## 2024-07-29 MED ORDER — FENTANYL CITRATE (PF) 100 MCG/2ML IJ SOLN
INTRAMUSCULAR | Status: DC | PRN
  Administered 2024-07-29 (×2): 50 ug via INTRAVENOUS

## 2024-07-29 MED ORDER — CLEVIDIPINE BUTYRATE 0.5 MG/ML IV EMUL
INTRAVENOUS | Status: DC | PRN
  Administered 2024-07-29: 8 mg/h via INTRAVENOUS

## 2024-07-29 MED ORDER — HEMOSTATIC AGENTS (NO CHARGE) OPTIME
TOPICAL | Status: DC | PRN
  Administered 2024-07-29: 1 via TOPICAL

## 2024-07-29 MED ORDER — PHENYLEPHRINE HCL-NACL 20-0.9 MG/250ML-% IV SOLN
INTRAVENOUS | Status: DC | PRN
  Administered 2024-07-29: 20 ug/min via INTRAVENOUS

## 2024-07-29 MED ORDER — VANCOMYCIN HCL 1000 MG IV SOLR
INTRAVENOUS | Status: DC | PRN
Start: 2024-07-29 — End: 2024-07-29
  Administered 2024-07-29: 2000 mg via TOPICAL

## 2024-07-29 MED ORDER — ACETAMINOPHEN 650 MG RE SUPP
650.0000 mg | RECTAL | Status: DC | PRN
  Administered 2024-07-30 – 2024-07-31 (×2): 650 mg via RECTAL
  Filled 2024-07-29 (×3): qty 1

## 2024-07-29 MED ORDER — PROMETHAZINE HCL 25 MG PO TABS: 12.5000 mg | ORAL_TABLET | ORAL | Status: AC | PRN

## 2024-07-29 MED ORDER — LIDOCAINE-EPINEPHRINE 1 %-1:100000 IJ SOLN
INTRAMUSCULAR | Status: AC
Start: 2024-07-29 — End: 2024-07-29
  Filled 2024-07-29: qty 1

## 2024-07-29 MED ORDER — ONDANSETRON HCL 4 MG/2ML IJ SOLN
INTRAMUSCULAR | Status: AC
Start: 2024-07-29 — End: 2024-07-29
  Filled 2024-07-29: qty 2

## 2024-07-29 MED ORDER — CLEVIDIPINE BUTYRATE 0.5 MG/ML IV EMUL
0.0000 mg/h | INTRAVENOUS | Status: DC
Start: 2024-07-29 — End: 2024-07-31
  Administered 2024-07-29: 8 mg/h via INTRAVENOUS
  Administered 2024-07-30: 17 mg/h via INTRAVENOUS
  Administered 2024-07-30: 21 mg/h via INTRAVENOUS
  Administered 2024-07-30: 8 mg/h via INTRAVENOUS
  Administered 2024-07-30: 15 mg/h via INTRAVENOUS
  Administered 2024-07-30: 6 mg/h via INTRAVENOUS
  Administered 2024-07-30: 21 mg/h via INTRAVENOUS
  Administered 2024-07-30: 19 mg/h via INTRAVENOUS
  Administered 2024-07-31: 12 mg/h via INTRAVENOUS
  Administered 2024-07-31 (×2): 15 mg/h via INTRAVENOUS
  Filled 2024-07-29 (×2): qty 50
  Filled 2024-07-29: qty 100
  Filled 2024-07-29 (×3): qty 50
  Filled 2024-07-29 (×3): qty 100
  Filled 2024-07-29: qty 50
  Filled 2024-07-29: qty 100

## 2024-07-29 MED ORDER — PROPOFOL 10 MG/ML IV BOLUS
INTRAVENOUS | Status: DC | PRN
Start: 2024-07-29 — End: 2024-07-29
  Administered 2024-07-29: 120 mg via INTRAVENOUS

## 2024-07-29 MED ORDER — ACETAMINOPHEN 325 MG PO TABS
650.0000 mg | ORAL_TABLET | ORAL | Status: DC | PRN
Start: 2024-07-29 — End: 2024-07-31
  Filled 2024-07-29 (×2): qty 2

## 2024-07-29 MED ORDER — POTASSIUM CHLORIDE IN NACL 20-0.9 MEQ/L-% IV SOLN
INTRAVENOUS | Status: DC
  Filled 2024-07-29: qty 1000

## 2024-07-29 MED ORDER — PANTOPRAZOLE SODIUM 40 MG IV SOLR
40.0000 mg | Freq: Every day | INTRAVENOUS | Status: DC
  Administered 2024-07-29: 40 mg via INTRAVENOUS
  Filled 2024-07-29: qty 10

## 2024-07-29 MED ORDER — ALBUMIN HUMAN 5 % IV SOLN: INTRAVENOUS | Status: DC | PRN

## 2024-07-29 MED ORDER — ONDANSETRON HCL 4 MG PO TABS: 4.0000 mg | ORAL_TABLET | ORAL | Status: AC | PRN

## 2024-07-29 MED ORDER — LIDOCAINE 2% (20 MG/ML) 5 ML SYRINGE
INTRAMUSCULAR | Status: AC
  Filled 2024-07-29: qty 5

## 2024-07-29 MED ORDER — ORAL CARE MOUTH RINSE: 15.0000 mL | Freq: Once | OROMUCOSAL | Status: AC

## 2024-07-29 MED ORDER — FENTANYL CITRATE (PF) 100 MCG/2ML IJ SOLN
INTRAMUSCULAR | Status: AC
  Filled 2024-07-29: qty 2

## 2024-07-29 SURGICAL SUPPLY — 1 items: NDL HYPO 22X1.5 SAFETY MO (MISCELLANEOUS) ×1 IMPLANT

## 2024-07-29 NOTE — Progress Notes (Signed)
 eLink Physician-Brief Progress Note Patient Name: TREMEL SETTERS DOB: 30-Jan-1966 MRN: [id number]   Date of Service  07/29/2024  HPI/Events of Note  asking for diet  s/p crani flap. passed bedside swallow. nurse feels comfortable giving him somehtigng  Underwent left-sided allograft cranioplasty, flap today Cleviprex drip for SBP less than 160  Camera: Discussed with RN. He is AAOX4. Able to protect airways, gets meds for headache without nausea.    eICU Interventions  Start liquid diet with aspiration precautions- SDH. 10/27. S/p flap.      Intervention Category Intermediate Interventions: Other:  Jodelle ONEIDA Hutching 07/29/2024, 9:41 PM  06:19 pt has only voided once early on my shift. We've tried urinal and purwick multiple times. Bladder scan showed - In and out cath ordered once

## 2024-07-29 NOTE — Progress Notes (Addendum)
 Progress Note    Ernest Romero   FMW:[id number]  DOB: 07/12/1966  DOA: 07/15/2024     14 PCP: [name]  Initial CC: Transferred from [facility] for bone flap  Hospital Course: Ernest Romero is a 58 year old male with PMH alcohol abuse, bipolar and panic disorder.   Patient was admitted on June 30, 2024 at [facility] hospital after patient was found to be unresponsive in the jail.  CT of the head showed large subdural hematoma and patient underwent left frontoparietal craniotomy with evacuation of the subdural hematoma and placement of a drain at [facility] on 06/30/2024 which was eventually removed.   He was transferred from [facility] to [facility] for flap as per neurosurgery. Patient was on alcohol withdrawal protocol with phenobarbital which was tapered off. Patient is also on medications for his bipolar 1 panic disorder and also was noticed to be having frequent attacks of panic and agitation requiring multiple doses of sedation.     Assessment & Plan:  Subdural hematoma  - s/p frontoparietal craniotomy indication at [facility] hospital on 06/22/2024: Transferred to [facility] for need for flap - Seen by neurosurgery/Dr. Rosslyn, bone flap is ordered. Tentative plan for surgery on 10/27 (Sat cancelled due to OR issues) - possible d/c to LTAC after surgery if accepted; Ernest Rams would sign patient in given lack of capacity    Bipolar disorder  Lacks decision making capacity - Evaluated by psychiatry - Continue Klonopin , lithium , Latuda  - Haldol  as needed - continue sitter for safety and re-direction - Appreciate capacity evaluation by psychiatry; if patient does attempt to ever leave, would need to be IVC'd  - Further medication adjustments per psychiatry - TOC assisting with disposition planning in setting of lacking capacity  Social determinants - lacks decision making capacity - Patient's support counselor Ernest Romero has been visiting intermittently. [phone]) - states does  not have kids, his spouse passed away. Has sister, estranged. Will need guardianship if noone to assume care at discharge  - TOC aware and following  HTN - Continue amlodipine    History of alcohol abuse -  Was treated with tapering dose of phenobarbital at [facility].  Not withdrawing anymore  Interval History:  Sleeping when seen. Otherwise comfortable.  Cranioplasty scheduled for today.  Antimicrobials:   DVT prophylaxis:  SCDs Start: 07/16/24 0142   Code Status:   Code Status: Full Code  Mobility Assessment (Last 72 Hours)     Mobility Assessment     Row Name 07/28/24 2030 07/28/24 1800 07/28/24 0820 07/27/24 2003 07/27/24 1824   Does the patient have exclusion criteria? No - Perform mobility assessment No - Perform mobility assessment No - Perform mobility assessment No - Perform mobility assessment No - Perform mobility assessment   Mobility Assessment Exclusion Criteria No exclusion criteria present, perform mobility assessment No exclusion criteria present, perform mobility assessment No exclusion criteria present, perform mobility assessment No exclusion criteria present, perform mobility assessment No exclusion criteria present, perform mobility assessment   What is the highest level of mobility based on the mobility assessment? Level 4 (Ambulates with assistance) - Balance while stepping forward/back - Complete Level 4 (Ambulates with assistance) - Balance while stepping forward/back - Complete Level 4 (Ambulates with assistance) - Balance while stepping forward/back - Complete Level 4 (Ambulates with assistance) - Balance while stepping forward/back - Complete Level 4 (Ambulates with assistance) - Balance while stepping forward/back - Complete   Is the above level different from baseline mobility prior to current  illness? Yes - Recommend PT order Yes - Recommend PT order Yes - Recommend PT order Yes - Recommend PT order Yes - Recommend PT order    Row Name 07/27/24 0850 07/26/24  2008 07/26/24 1041       Does the patient have exclusion criteria? No - Perform mobility assessment No - Perform mobility assessment No - Perform mobility assessment     Mobility Assessment Exclusion Criteria No exclusion criteria present, perform mobility assessment No exclusion criteria present, perform mobility assessment No exclusion criteria present, perform mobility assessment     What is the highest level of mobility based on the mobility assessment? Level 4 (Ambulates with assistance) - Balance while stepping forward/back - Complete Level 4 (Ambulates with assistance) - Balance while stepping forward/back - Complete Level 4 (Ambulates with assistance) - Balance while stepping forward/back - Complete     Is the above level different from baseline mobility prior to current illness? Yes - Recommend PT order Yes - Recommend PT order Yes - Recommend PT order        Barriers to discharge: None Disposition Plan: TBD HH orders placed:  Status is: Inpatient  Objective: Blood pressure 105/74, pulse (!) 55, temperature 98.2 F (36.8 C), temperature source Oral, resp. rate 16, height 5' 10 (1.778 m), weight 67.5 kg, SpO2 98%.  Examination:  Physical Exam Constitutional:      General: He is not in acute distress.    Appearance: Normal appearance.  HENT:     Head:     Comments: Left frontoparietal craniotomy noted.  Surgical incision well-healed    Mouth/Throat:     Mouth: Mucous membranes are moist.  Eyes:     Extraocular Movements: Extraocular movements intact.  Cardiovascular:     Rate and Rhythm: Normal rate and regular rhythm.  Pulmonary:     Effort: Pulmonary effort is normal. No respiratory distress.     Breath sounds: Normal breath sounds. No wheezing.  Abdominal:     General: Bowel sounds are normal. There is no distension.     Palpations: Abdomen is soft.     Tenderness: There is no abdominal tenderness.  Musculoskeletal:        General: Normal range of motion.      Cervical back: Normal range of motion and neck supple.  Skin:    General: Skin is warm and dry.  Neurological:     Mental Status: He is alert.     Comments: Slowed mentation noted.  Psychiatric:     Comments: Cooperative behavior      Consultants:  Psychiatry Neurosurgery  Procedures:    Data Reviewed: No results found for this or any previous visit (from the past 24 hours).  I have reviewed pertinent nursing notes, vitals, labs, and images as necessary. I have ordered labwork to follow up on as indicated.  I have reviewed the last notes from staff over past 24 hours. I have discussed patient's care plan and test results with nursing staff, CM/SW, and other staff as appropriate.  Old records reviewed in assessment of this patient    LOS: 14 days   Alm Apo, MD [facility] 07/29/2024, 10:25 AM

## 2024-07-29 NOTE — Progress Notes (Signed)
 Patient arrived from PACU, A&O X 4, on RA, NSR. Patient made comfortable in bed and orders released.

## 2024-07-29 NOTE — Anesthesia Procedure Notes (Signed)
 Arterial Line Insertion Start/End10/27/2025 3:43 PM, 07/29/2024 3:43 PM Performed by: CRNA  Patient location: Pre-op. Preanesthetic checklist: patient identified, IV checked, site marked, risks and benefits discussed, surgical consent, monitors and equipment checked, pre-op evaluation, timeout performed and anesthesia consent Lidocaine  1% used for infiltration Right, radial was placed Catheter size: 20 G Hand hygiene performed  and maximum sterile barriers used   Attempts: 1 Following insertion, dressing applied and Biopatch. Post procedure assessment: normal  Patient tolerated the procedure well with no immediate complications.

## 2024-07-29 NOTE — Anesthesia Preprocedure Evaluation (Addendum)
 Anesthesia Evaluation  Patient identified by MRN, date of birth, ID band Patient awake    Reviewed: Allergy & Precautions, NPO status , Patient's Chart, lab work & pertinent test results  Airway Mallampati: Unable to assess  TM Distance: >3 FB Neck ROM: Full    Dental  (+) Poor Dentition, Missing, Chipped   Pulmonary Patient abstained from smoking., former smoker   breath sounds clear to auscultation       Cardiovascular hypertension, Pt. on medications  Rhythm:Regular Rate:Normal     Neuro/Psych  PSYCHIATRIC DISORDERS Anxiety Depression Bipolar Disorder      GI/Hepatic negative GI ROS,,,(+) Hepatitis -  Endo/Other  negative endocrine ROS    Renal/GU negative Renal ROS     Musculoskeletal negative musculoskeletal ROS (+)    Abdominal   Peds  Hematology negative hematology ROS (+)   Anesthesia Other Findings   Reproductive/Obstetrics                              Anesthesia Physical Anesthesia Plan  ASA: 3  Anesthesia Plan: General   Post-op Pain Management: Ofirmev  IV (intra-op)*   Induction: Intravenous  PONV Risk Score and Plan: 3 and Ondansetron , Dexamethasone and Midazolam  Airway Management Planned: Oral ETT  Additional Equipment: Arterial line  Intra-op Plan:   Post-operative Plan: Extubation in OR  Informed Consent: I have reviewed the patients History and Physical, chart, labs and discussed the procedure including the risks, benefits and alternatives for the proposed anesthesia with the patient or authorized representative who has indicated his/her understanding and acceptance.     Dental advisory given  Plan Discussed with: CRNA  Anesthesia Plan Comments:          Anesthesia Quick Evaluation

## 2024-07-29 NOTE — Progress Notes (Signed)
 Physical Therapy Treatment Patient Details Name: Ernest Romero MRN: [id number] DOB: 01/02/1966 Today's Date: 07/29/2024   History of Present Illness Pt is a 58 y.o. male presenting 10/14 from [facility] where he was admitted 9/28 for unresponsive episode in the jail. 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 On CIWA protocol. Transferred to [facility] for further management of craniotomy and flap. PMH alcohol use disorder, psychiatric disorder, HTN    PT Comments  Patient self limited with mobility in hallway reporting feeling weak since NPO for his procedure later today.  Noted more eager and ready this session without behavioral issues.  Patient also donning his own helmet actually asking for it prior to standing.  Will continue to follow during acute stay.     If plan is discharge home, recommend the following: A lot of help with bathing/dressing/bathroom;Assistance with cooking/housework;Direct supervision/assist for medications management;Direct supervision/assist for financial management;Assist for transportation;Help with stairs or ramp for entrance;Supervision due to cognitive status;A little help with walking and/or transfers   Can travel by private vehicle     Yes  Equipment Recommendations       Recommendations for Other Services       Precautions / Restrictions Precautions Precautions: Fall Recall of Precautions/Restrictions: Impaired Precaution/Restrictions Comments: helmet with mobility     Mobility  Bed Mobility Overal bed mobility: Needs Assistance Bed Mobility: Supine to Sit     Supine to sit: Contact guard     General bed mobility comments: for balance when sitting up    Transfers Overall transfer level: Needs assistance Equipment used: Rolling walker (2 wheels) Transfers: Sit to/from Stand Sit to Stand: Contact guard assist           General transfer comment: stable with RW in  hallway, self limited due to fatigue    Ambulation/Gait   Gait Distance (Feet): 140 Feet               Stairs             Wheelchair Mobility     Tilt Bed    Modified Rankin (Stroke Patients Only)       Balance Overall balance assessment: Needs assistance Sitting-balance support: Feet supported Sitting balance-Leahy Scale: Fair     Standing balance support: Bilateral upper extremity supported, Reliant on assistive device for balance Standing balance-Leahy Scale: Poor                              Communication Communication Communication: No apparent difficulties  Cognition Arousal: Alert Behavior During Therapy: Flat affect   PT - Cognitive impairments: Awareness, Safety/Judgement, Problem solving                       PT - Cognition Comments: eager to walk, frustrated appropriately about no food or drink till procedure   Following commands impaired: Only follows one step commands consistently, Follows one step commands with increased time    Cueing Cueing Techniques: Verbal cues  Exercises      General Comments General comments (skin integrity, edema, etc.): sitter in room changing linens; pt donned helmet once provided prior to sit to stand      Pertinent Vitals/Pain Pain Assessment Pain Assessment: No/denies pain    Home Living  Prior Function            PT Goals (current goals can now be found in the care plan section) Progress towards PT goals: Progressing toward goals    Frequency    Min 2X/week      PT Plan      Co-evaluation              AM-PAC PT 6 Clicks Mobility   Outcome Measure  Help needed turning from your back to your side while in a flat bed without using bedrails?: None Help needed moving from lying on your back to sitting on the side of a flat bed without using bedrails?: A Little Help needed moving to and from a bed to a chair (including a  wheelchair)?: A Little Help needed standing up from a chair using your arms (e.g., wheelchair or bedside chair)?: A Little Help needed to walk in hospital room?: A Little Help needed climbing 3-5 steps with a railing? : A Lot 6 Click Score: 18    End of Session Equipment Utilized During Treatment: Gait belt (helmet) Activity Tolerance: Patient tolerated treatment well Patient left: in chair;with nursing/sitter in room   PT Visit Diagnosis: Other symptoms and signs involving the nervous system (R29.898);Muscle weakness (generalized) (M62.81)     Time: 8774-8764 PT Time Calculation (min) (ACUTE ONLY): 10 min  Charges:    $Gait Training: 8-22 mins PT General Charges $$ ACUTE PT VISIT: 1 Visit                     Micheline Portal, PT Acute Rehabilitation Services [phone] 07/29/2024    Montie Portal 07/29/2024, 2:52 PM

## 2024-07-29 NOTE — Op Note (Signed)
 DATE OF SURGERY:   07/29/2024   ATTENDING SURGEON:  Dino Sable, MD   PREOPERATIVE DIAGNOSIS: LEFT cranial vault defect.    POSTOPERATIVE DIAGNOSIS: LEFT cranial vault defect.    PROCEDURE PERFORMED: LEFT-sided allograft cranioplasty.    ANESTHESIA: General endotracheal anesthesia.   ESTIMATED BLOOD LOSS: See anesthesia chart.    URINE OUTPUT: See anesthesia chart.    CRYSTALLOIDS: See anesthesia chart.    COMPLICATIONS: None.    SPECIMENS: None.    DRAINS: One JP.   PREOPERATIVE COURSE: The patient is a 58 year old patient with a past medical history significant for a decompressive hemicraniectomy, who opted for a replacement of the bone flap. The risks of the procedure were discussed included, but not limited to infection, hemorrhage, stroke, paralysis, blindness, speech impairment, coma, seizures, DVT, PE, cardiopulmonary complications, and death amongst others. All his questions were answered to his satisfaction and voiced by the patient and his caregivers and they requested for us  to proceed.   DESCRIPTION OF PROCEDURE: The patient was brought to the operating room and after general endotracheal anesthesia was ensued, the patient was placed in supine position with the head turned to the right. Old incision was outlined and was prepped and draped in usual sterile fashion. After this, old incision was injected with lidocaine  with epinephrine  superficially. Using a #10 blade, an incision was made and the skin flap was developed between the galea and the neo-dura and was reflected inferiorly. Hereafter, the temporalis muscle was gently dissected away from underlying soft tissue and reflected inferiorly. The bony edges were then prepared, after which hemostasis was obtained with Surgicel. The bone flap was kept soaking in a betadine solution for half an hour, after which it was cleansed then placed into the cranial vault defect after it was washed out and secured with  titanium plates and screws again. The temporalis muscle was laid over the bone flap after which copious irrigation was performed with bacitracin saline and a subgaleal drain was left in situ and exited through a separate stab wound. The galea and skin were closed in separate layers and at the end of the case, all counts were complete.

## 2024-07-29 NOTE — Transfer of Care (Signed)
 Immediate Anesthesia Transfer of Care Note  Patient: Norleen FORBES Sample  Procedure(s) Performed: CRANIOPLASTY, WITH BONE FLAP REPLACEMENT (Left)  Patient Location: PACU  Anesthesia Type:General  Level of Consciousness: drowsy  Airway & Oxygen Therapy: Patient Spontanous Breathing  Post-op Assessment: Report given to RN and Post -op Vital signs reviewed and stable  Post vital signs: Reviewed and stable  Last Vitals:  Vitals Value Taken Time  BP 138/92 07/29/24 18:15  Temp    Pulse 96 07/29/24 18:15  Resp 15 07/29/24 18:15  SpO2 94 % 07/29/24 18:15  Vitals shown include unfiled device data.  Last Pain:  Vitals:   07/29/24 1454  TempSrc:   PainSc: 0-No pain      Patients Stated Pain Goal: 3 (07/19/24 1209)  Complications: No notable events documented.

## 2024-07-29 NOTE — H&P (Signed)
 58yo gentleman with a LEFT cranial vault defect  Past Medical History:  Diagnosis Date   Anxiety    Bipolar 1 disorder (HCC)    Depression    Hepatitis    Hypertension    Past Surgical History:  Procedure Laterality Date   CRANIOTOMY Left 06/30/2024   Procedure: CRANIOTOMY HEMATOMA EVACUATION SUBDURAL;  Surgeon: Melonie Grass, MD;  Location: [facility] ORS;  Service: Neurosurgery;  Laterality: Left;   Social History   Socioeconomic History   Marital status: Widowed    Spouse name: Not on file   Number of children: Not on file   Years of education: Not on file   Highest education level: Not on file  Occupational History   Not on file  Tobacco Use   Smoking status: Former   Smokeless tobacco: Never  Vaping Use   Vaping status: Every Day   Substances: Nicotine, Flavoring  Substance and Sexual Activity   Alcohol use: Not Currently    Comment: Denies currently using   Drug use: Not Currently    Comment: Denies current use   Sexual activity: Never  Other Topics Concern   Not on file  Social History Narrative   Not on file   Social Drivers of Health   Financial Resource Strain: Not on file  Food Insecurity: Patient Unable To Answer (07/16/2024)   Hunger Vital Sign    Worried About Running Out of Food in the Last Year: Patient unable to answer    Ran Out of Food in the Last Year: Patient unable to answer  Transportation Needs: Patient Unable To Answer (07/16/2024)   PRAPARE - Transportation    Lack of Transportation (Medical): Patient unable to answer    Lack of Transportation (Non-Medical): Patient unable to answer  Physical Activity: Not on file  Stress: Not on file  Social Connections: Not on file  Intimate Partner Violence: Patient Unable To Answer (07/16/2024)   Humiliation, Afraid, Rape, and Kick questionnaire    Fear of Current or Ex-Partner: Patient unable to answer    Emotionally Abused: Patient unable to answer    Physically Abused: Patient unable to  answer    Sexually Abused: Patient unable to answer   History reviewed. No pertinent family history. No Known Allergies Scheduled Meds:  [MAR Hold] amLODipine   10 mg Oral Daily   [MAR Hold] Chlorhexidine Gluconate Cloth  6 each Topical Daily   [MAR Hold] clonazePAM   1 mg Oral BID   [MAR Hold] folic acid   1 mg Oral Daily   [MAR Hold] haloperidol   5 mg Oral Once   [MAR Hold] lithium  carbonate  900 mg Oral QHS   [MAR Hold] lurasidone   80 mg Oral Q breakfast   [MAR Hold] nicotine  14 mg Transdermal Daily   [MAR Hold] sodium chloride  flush  10-40 mL Intracatheter Q12H   [MAR Hold] thiamine   100 mg Oral Daily   Continuous Infusions:  sodium chloride  10 mL/hr at 07/29/24 1500   ceFAZolin     PRN Meds:.[MAR Hold] acetaminophen  **OR** [DISCONTINUED] acetaminophen , [MAR Hold] haloperidol  lactate **OR** [MAR Hold] haloperidol  lactate, [MAR Hold] haloperidol  lactate **OR** [MAR Hold] haloperidol  lactate, [MAR Hold] sodium chloride  flush Vitals:   07/29/24 1454 07/29/24 1458  BP:  (!) 121/91  Pulse: 78   Resp: 18   Temp:    SpO2: 94%    Physical Exam Constitutional:    HENT:     Head: Normocephalic.     Nose: Nose normal.  Eyes:  Pupils: Pupils are equal, round, and reactive to light.  Cardiovascular:     Rate and Rhythm: Normal rate.  Pulmonary:     Effort: Pulmonary effort is normal.  Abdominal:     General: Abdomen is flat.  Musculoskeletal:     Cervical back: Normal range of motion.  Neurological:     Mental Status: He is alert.   ASSESSMENT: LEFT SKULL DEFECT  PLAN: LEFT CRANIOPLASTY

## 2024-07-29 NOTE — Consult Note (Signed)
   NAME:  Ernest Romero, MRN:  [id number], DOB:  [birth year], LOS: 14 ADMISSION DATE:  07/15/2024, CONSULTATION DATE: 07/29/2024 REFERRING MD:  JONELLE Sable MD, CHIEF COMPLAINT:   Postcraniotomy and bone flap  History of Present Illness:   58 year old with history of alcohol abuse, bipolar, panic disorder admitted from jail to [facility] in September for large subdural hematoma status post left frontal craniotomy and evacuation of bleed. Transferred to [facility] for flap placement.  Pertinent  Medical History    has a past medical history of Anxiety, Bipolar 1 disorder (HCC), Depression, Hepatitis, and Hypertension.   Significant Hospital Events: Including procedures, antibiotic start and stop dates in addition to other pertinent events     Interim History / Subjective:    Objective    Blood pressure (!) 121/91, pulse 78, temperature 97.8 F (36.6 C), temperature source Oral, resp. rate 18, height 5' 10 (1.778 m), weight 67.5 kg, SpO2 94%.        Intake/Output Summary (Last 24 hours) at 07/29/2024 1815 Last data filed at 07/29/2024 1736 Gross per 24 hour  Intake 380 ml  Output 25 ml  Net 355 ml   Filed Weights   07/16/24 0800  Weight: 67.5 kg    Examination: Somnolent, arousable, mild confusion.  Able to follow command JP drain of the head with dressing Lungs are clear to auscultation, heart rate is regular rate and rhythm Abdomen soft No peripheral edema  Labs/imaging reviewed Significant for BUN/creatinine 21/1.04 AST 48, ALT 85 WBC 9.3, hemoglobin 13.5, platelets 438   Resolved problem list   Assessment and Plan  Subdural hematoma Status post frontoparietal craniotomy on 06/30/2024 Underwent left-sided allograft cranioplasty, flap today Cleviprex drip for SBP less than 160 Admitted to ICU for monitoring.  Bipolar disorder Continue lithium , Klonopin , Latuda  Lithium  levels are normal.  Continue monitoring Haldol  IV as needed 1:1 [employer]  capacity as per psychiatry team  History of alcohol, tobacco abuse Was on a phenobarb taper at [facility].   He has been on the time window for developing DTs Nicotine patch Continue monitoring  Critical care time:    The patient is critically ill with multiple organ system failure and requires high complexity decision making for assessment and support, frequent evaluation and titration of therapies, advanced monitoring, review of radiographic studies and interpretation of complex data.   Critical Care Time devoted to patient care services, exclusive of separately billable procedures, described in this note is 45 minutes.   Kimmie Berggren MD [facility] See Amion for pager  If no response to pager , please call [phone] until 7pm After 7:00 pm call Elink  [phone] 07/29/2024, 6:37 PM

## 2024-07-29 NOTE — Anesthesia Procedure Notes (Signed)
 Procedure Name: Intubation Date/Time: 07/29/2024 4:20 PM  Performed by: Emmitt Millman, CRNAPre-anesthesia Checklist: Patient identified, Emergency Drugs available, Suction available and Patient being monitored Patient Re-evaluated:Patient Re-evaluated prior to induction Oxygen Delivery Method: Circle system utilized Preoxygenation: Pre-oxygenation with 100% oxygen Induction Type: IV induction Ventilation: Two handed mask ventilation required and Oral airway inserted - appropriate to patient size Laryngoscope Size: Mac and 4 Grade View: Grade I Tube type: Oral Tube size: 7.5 mm Number of attempts: 1 Airway Equipment and Method: Stylet and Oral airway Placement Confirmation: ETT inserted through vocal cords under direct vision, positive ETCO2 and breath sounds checked- equal and bilateral Secured at: 22 cm Tube secured with: Tape Dental Injury: Teeth and Oropharynx as per pre-operative assessment

## 2024-07-29 NOTE — Anesthesia Postprocedure Evaluation (Signed)
 Anesthesia Post Note  Patient: Ernest Romero  Procedure(s) Performed: CRANIOPLASTY, WITH BONE FLAP REPLACEMENT (Left)     Patient location during evaluation: PACU Anesthesia Type: General Level of consciousness: awake and alert Pain management: pain level controlled Vital Signs Assessment: post-procedure vital signs reviewed and stable Respiratory status: spontaneous breathing, nonlabored ventilation, respiratory function stable and patient connected to nasal cannula oxygen Cardiovascular status: blood pressure returned to baseline and stable Postop Assessment: no apparent nausea or vomiting Anesthetic complications: no   No notable events documented.  Last Vitals:  Vitals:   07/29/24 1840 07/29/24 1845  BP:    Pulse: 97 89  Resp: 17 14  Temp:    SpO2: 93% 92%    Last Pain:  Vitals:   07/29/24 1830  TempSrc:   PainSc: Asleep                 Franky JONETTA Bald
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
## 2024-07-30 ENCOUNTER — Inpatient Hospital Stay (HOSPITAL_COMMUNITY): Payer: MEDICAID

## 2024-07-30 LAB — CBC
HCT: 42 % (ref 39.0–52.0)
Hemoglobin: 14.3 g/dL (ref 13.0–17.0)
MCH: 29.7 pg (ref 26.0–34.0)
MCHC: 34 g/dL (ref 30.0–36.0)
MCV: 87.3 fL (ref 80.0–100.0)
Platelets: 267 K/uL (ref 150–400)
RBC: 4.81 MIL/uL (ref 4.22–5.81)
RDW: 12.3 % (ref 11.5–15.5)
WBC: 11.2 K/uL — ABNORMAL HIGH (ref 4.0–10.5)
nRBC: 0 % (ref 0.0–0.2)

## 2024-07-30 LAB — GLUCOSE, CAPILLARY
Glucose-Capillary: 111 mg/dL — ABNORMAL HIGH (ref 70–99)
Glucose-Capillary: 106 mg/dL — ABNORMAL HIGH (ref 70–99)
Glucose-Capillary: 96 mg/dL (ref 70–99)
Glucose-Capillary: 129 mg/dL — ABNORMAL HIGH (ref 70–99)
Glucose-Capillary: 115 mg/dL — ABNORMAL HIGH (ref 70–99)
Glucose-Capillary: 119 mg/dL — ABNORMAL HIGH (ref 70–99)

## 2024-07-30 LAB — COMPREHENSIVE METABOLIC PANEL WITH GFR
ALT: 28 U/L (ref 0–44)
AST: 23 U/L (ref 15–41)
Albumin: 3.7 g/dL (ref 3.5–5.0)
Alkaline Phosphatase: 76 U/L (ref 38–126)
Anion gap: 10 (ref 5–15)
BUN: 16 mg/dL (ref 6–20)
CO2: 20 mmol/L — ABNORMAL LOW (ref 22–32)
Calcium: 9.4 mg/dL (ref 8.9–10.3)
Chloride: 105 mmol/L (ref 98–111)
Creatinine, Ser: 1.21 mg/dL (ref 0.61–1.24)
GFR, Estimated: >60 mL/min (ref >60)
Glucose, Bld: 100 mg/dL — ABNORMAL HIGH (ref 70–99)
Potassium: 3.9 mmol/L (ref 3.5–5.1)
Sodium: 135 mmol/L (ref 135–145)
Total Bilirubin: 0.8 mg/dL (ref 0.0–1.2)
Total Protein: 6.8 g/dL (ref 6.5–8.1)

## 2024-07-30 MED ORDER — HYDROCHLOROTHIAZIDE 12.5 MG PO TABS: 12.5000 mg | ORAL_TABLET | Freq: Every day | ORAL | Status: DC

## 2024-07-30 MED ORDER — PANTOPRAZOLE SODIUM 40 MG PO TBEC: 40.0000 mg | DELAYED_RELEASE_TABLET | Freq: Every day | ORAL | Status: DC

## 2024-07-30 MED ORDER — ADULT MULTIVITAMIN W/MINERALS CH: 1.0000 | ORAL_TABLET | Freq: Every day | ORAL | Status: DC

## 2024-07-30 MED ORDER — ADULT MULTIVITAMIN W/MINERALS CH
1.0000 | ORAL_TABLET | Freq: Every day | ORAL | Status: DC
  Filled 2024-07-30: qty 1

## 2024-07-30 MED ORDER — HALOPERIDOL 5 MG PO TABS: 5.0000 mg | ORAL_TABLET | Freq: Four times a day (QID) | ORAL | Status: DC | PRN

## 2024-07-30 MED ORDER — HALOPERIDOL LACTATE 5 MG/ML IJ SOLN: 5.0000 mg | Freq: Four times a day (QID) | INTRAMUSCULAR | Status: DC | PRN

## 2024-07-30 NOTE — Progress Notes (Signed)
 Initial Nutrition Assessment  DOCUMENTATION CODES:   Severe malnutrition in context of acute illness/injury  INTERVENTION:  After Cortrak placement, begin Osmolite 1.5 at 20 mL/hr. Increase by 10 mL/hr every 12 hours as tolerated to 60 mL/hr goal. 1 packet ProSource TF20 daily via feeding tube. Minimum water flushes of 30 mL every 4 hours to maintain tube patency. Monitor fluid status and adjust water flushes accordingly. This regimen provides 2240 Kcals, 110 g protein, 293 g carbohydrates, and 1097 mL free water daily, which meets 100% of the patient's estimated nutrition needs. The patient is at risk for refeeding syndrome. Monitor magnesium, potassium, and phosphorus daily for at least 3 days, with replacement per protocol. Continue multivitamin daily per feeding tube. Continue 100 mg thiamine  daily per feeding tube.   NUTRITION DIAGNOSIS:   Severe Malnutrition related to acute illness as evidenced by energy intake < or equal to 50% for > or equal to 5 days, severe muscle depletion, moderate fat depletion.   GOAL:   Patient will meet greater than or equal to 90% of their needs   MONITOR:   TF tolerance, Labs, Weight trends  REASON FOR ASSESSMENT:   Malnutrition Screening Tool, NPO/Clear Liquid Diet    ASSESSMENT:   PMH significant for ETOH abuse, HTN, bipolar disorder, anxiety/depression, hepatitis, craniotomy 06/30/24 for SDH during incarceration, presented on 10/13 for management of craniotomy s/p craniotomy with flap replacement 10/27.  The patient was noted with worsening altered mental status this morning. CT head showing increase in size of subdural fluid collection with midline shift. He failed SLP swallow eval today - SLP expects possible resumption of PO once alertness improves but recommends short-term EN in the interim. Visited the patient who was unable to communicate. RN and sitter at bedside. RN confirms need for Cortrak - order has been placed.   Scheduled  Meds:  amLODipine   10 mg Oral Daily   Chlorhexidine Gluconate Cloth  6 each Topical Daily   folic acid   1 mg Oral Daily   lithium  carbonate  900 mg Oral QHS   lurasidone   80 mg Oral Q breakfast   [START ON 07/31/2024] multivitamin with minerals  1 tablet Oral Daily   nicotine  14 mg Transdermal Daily   pantoprazole  40 mg Oral Daily   sodium chloride  flush  10-40 mL Intracatheter Q12H   thiamine   100 mg Oral Daily   Continuous Infusions:  clevidipine 17 mg/hr (07/30/24 1600)  34 mL/hr = 1632 Kcals/day  Labs:     Latest Ref Rng & Units 07/30/2024    4:31 AM 07/29/2024    5:17 PM 07/16/2024    3:34 AM  CMP  Glucose 70 - 99 mg/dL 899   866   BUN 6 - 20 mg/dL 16   21   Creatinine 9.38 - 1.24 mg/dL 8.78   8.95   Sodium 864 - 145 mmol/L 135  138  136   Potassium 3.5 - 5.1 mmol/L 3.9  3.9  3.9   Chloride 98 - 111 mmol/L 105   103   CO2 22 - 32 mmol/L 20   21   Calcium 8.9 - 10.3 mg/dL 9.4   9.3   Total Protein 6.5 - 8.1 g/dL 6.8   6.8   Total Bilirubin 0.0 - 1.2 mg/dL 0.8   0.5   Alkaline Phos 38 - 126 U/L 76   97   AST 15 - 41 U/L 23   48   ALT 0 - 44 U/L 28  85    I/O: -30 mL since admit  Meal Intake: 50-100% while on diet this admission   NUTRITION - FOCUSED PHYSICAL EXAM:  Flowsheet Row Most Recent Value  Orbital Region Mild depletion  Upper Arm Region Moderate depletion  Thoracic and Lumbar Region Mild depletion  Buccal Region Moderate depletion  Temple Region Moderate depletion  Clavicle Bone Region Severe depletion  Clavicle and Acromion Bone Region Severe depletion  Scapular Bone Region Moderate depletion  Dorsal Hand Mild depletion  Patellar Region Severe depletion  Anterior Thigh Region Severe depletion  Posterior Calf Region Severe depletion  Edema (RD Assessment) None  Hair Reviewed  Eyes Reviewed  Mouth Reviewed  Skin Reviewed  Nails Reviewed    Diet Order             Diet NPO time specified Except for: Ice Chips  Diet effective now                    EDUCATION NEEDS:   Not appropriate for education at this time  Skin:  Skin Assessment: Skin Integrity Issues: Skin Integrity Issues:: Incisions Incisions: closed surgical incision to head  Last BM:  10/24 type 4  Height:   Ht Readings from Last 1 Encounters:  07/16/24 5' 10 (1.778 m)    Weight:    12 Kg (15%) loss in 3 months  Ideal Body Weight:  75.5 kg  BMI:  Body mass index is 21.35 kg/m.  Estimated Nutritional Needs:   Kcal:  2100-2400  Protein:  110-130  Fluid:  2100-2400    Leverne Ruth, MS, RDN, LDN [place]. [facility] See AMION for contact information

## 2024-07-30 NOTE — Progress Notes (Addendum)
   NAME:  Ernest Romero, MRN:  [id number], DOB:  1965-11-24, LOS: 15 ADMISSION DATE:  07/15/2024, CONSULTATION DATE: 07/29/2024 REFERRING MD:  JONELLE Sable MD, CHIEF COMPLAINT:   Postcraniotomy and bone flap  History of Present Illness:   58 year old with history of alcohol abuse, bipolar, panic disorder admitted from jail to [facility] in September for large subdural hematoma status post left frontal craniotomy and evacuation of bleed. Transferred to [facility] for flap placement.  Pertinent  Medical History    has a past medical history of Anxiety, Bipolar 1 disorder (HCC), Depression, Hepatitis, and Hypertension.   Significant Hospital Events: Including procedures, antibiotic start and stop dates in addition to other pertinent events   10/27 crani w/ flap replacement  Interim History / Subjective:  On clevi  Objective    Blood pressure (!) 139/91, pulse 82, temperature 98.7 F (37.1 C), temperature source Axillary, resp. rate (!) 23, height 5' 10 (1.778 m), weight 67.5 kg, SpO2 93%.        Intake/Output Summary (Last 24 hours) at 07/30/2024 9166 Last data filed at 07/30/2024 0700 Gross per 24 hour  Intake 1841.01 ml  Output 1035 ml  Net 806.01 ml   Filed Weights   07/16/24 0800  Weight: 67.5 kg    Examination: General:  ill appearing male in nad HEENT: MM pink/moist Neuro: mildly lethargic but Aox3; globally weak but MAE CV: s1s2, RRR, no m/r/g PULM:  dim rhonchi BS bilaterally GI: soft, bsx4 active  Extremities: warm/dry, no edema  Skin: no rashes or lesions    Resolved problem list   Assessment and Plan  Subdural hematoma Status post frontoparietal craniotomy on 06/30/2024 Underwent left-sided allograft cranioplasty, flap 10/27 Plan: -nsg following; appreciate recs -cont clevi for sbp <160; will add orals to help wean off clevi -neuro checks -limit sedating meds -Continue neuroprotective measures- normothermia, euglycemia, HOB greater than 30, head in neutral  alignment, normocapnia, normoxia.  -pt/ot/slp  Bipolar disorder Plan: -psych following -Continue lithium , Klonopin , Latuda  -trend lithium  levels -Haldol  IV as needed -1:1 Sitter -Lacks decision-making capacity as per psychiatry team  History of alcohol, tobacco abuse -Was on a phenobarb taper at [facility]. He has been on the time window for developing DTs Plan: -Nicotine patch -Continue monitoring -thiamine , folic acid , mvi  HTN Plan: -clevi as above -cont norvasc ; may need to add additional if remains on clevi  Critical care time: 35 minutes   JD Emilio RIGGERS [facility] 07/30/2024, 10:20 AM  Please see [email or web address] for pager details.  From 7A-7P if no response, please call [phone]. After hours, please call ELink [phone].

## 2024-07-30 NOTE — Evaluation (Addendum)
 Clinical/Bedside Swallow Evaluation Patient Details  Name: Ernest Romero MRN: [id number] Date of Birth: June 13, 1966  Today's Date: 07/30/2024 Time: SLP Start Time (ACUTE ONLY): 1127 SLP Stop Time (ACUTE ONLY): 1140 SLP Time Calculation (min) (ACUTE ONLY): 13 min  Past Medical History:  Past Medical History:  Diagnosis Date   Anxiety    Bipolar 1 disorder (HCC)    Depression    Hepatitis    Hypertension    Past Surgical History:  Past Surgical History:  Procedure Laterality Date   CRANIOTOMY Left 06/30/2024   Procedure: CRANIOTOMY HEMATOMA EVACUATION SUBDURAL;  Surgeon: Melonie Grass, MD;  Location: [facility] ORS;  Service: Neurosurgery;  Laterality: Left;   HPI:  58 yo male with history of alcohol use disorder, psychiatric disorder, and HTN, who presents 10/14 from [facility], where he was admitted 9/28 after an unresponsive episode in jail. Found to have large SDH s/p L frontoparietal craniotomy with evacuation of SDH and drain placement 9/28 (removed 9/29). Followed by SLP for cognitive-linguistic goals and on a regular diet prior to transfer. Transferred to [facility] for further management of craniotomy and flab. S/p cranioplasty with bone flap replacement 10/27. Change in status thought to be related to Klonopin  administration 10/28, repeat CTH shows significant increase in size of mixed attenuation subdural fluid collection underlying the L tempoparietal craniotomy site, now measuring approximately 15 mm in thickness, with associated mild mass effect and slight rightward midline shift.    Assessment / Plan / Recommendation  Clinical Impression  Recommend he be made NPO due to changes related to mentation. Allow ice chips in moderation with frequent oral care and priority meds crushed in puree. Discussed with RN and PA. Prognosis to resume POs once alertness improves appears fair, though may consider short-term enteral nutrition in the interim.   Pt passed the swallow screen 10/27 and a clear  nectar thick liquid diet was initiated, though he was previously tolerating a regular diet with thin liquids prior to surgery. He presents with seemingly decreased alertness after imaging shows significantly increased size of SDH. Verbalizations and command following are limited. Provided oral care followed by ice chips, water, and puree, resulting in prolonged oral holding. Swallow initiation is suspected to be significantly delayed but once initiated, he swallows multiple times per bolus regardless of its volume. Suspect his level of alertness is impacting performance today. Will continue following as able to resume dysphagia and cognitive-linguistic interventions.  SLP Visit Diagnosis: Dysphagia, unspecified (R13.10)    Aspiration Risk  Moderate aspiration risk    Diet Recommendation NPO except meds;Ice chips PRN after oral care    Medication Administration: Crushed with puree    Other  Recommendations Oral Care Recommendations: Oral care QID;Oral care prior to ice chip/H20     Assistance Recommended at Discharge    Functional Status Assessment Patient has had a recent decline in their functional status and/or demonstrates limited ability to make significant improvements in function in a reasonable and predictable amount of time  Frequency and Duration min 2x/week  2 weeks       Prognosis Prognosis for improved oropharyngeal function: Good Barriers to Reach Goals: Cognitive deficits      Swallow Study   General HPI: 58 yo male with history of alcohol use disorder, psychiatric disorder, and HTN, who presents 10/14 from [facility], where he was admitted 9/28 after an unresponsive episode in jail. Found to have large SDH s/p L frontoparietal craniotomy with evacuation of SDH and drain placement 9/28 (removed 9/29). Followed by SLP  for cognitive-linguistic goals and on a regular diet prior to transfer. Transferred to [facility] for further management of craniotomy and flab. S/p cranioplasty with bone  flap replacement 10/27. Change in status thought to be related to Klonopin  administration 10/28, repeat CTH shows significant increase in size of mixed attenuation subdural fluid collection underlying the L tempoparietal craniotomy site, now measuring approximately 15 mm in thickness, with associated mild mass effect and slight rightward midline shift. Type of Study: Bedside Swallow Evaluation Previous Swallow Assessment: none in chart Diet Prior to this Study: Clear liquid diet;Mildly thick liquids (Level 2, nectar thick) Temperature Spikes Noted: No Respiratory Status: Room air History of Recent Intubation: No Behavior/Cognition: Lethargic/Drowsy;Requires cueing Oral Cavity Assessment: Within Functional Limits Oral Care Completed by SLP: Yes Oral Cavity - Dentition: Poor condition;Missing dentition Vision: Functional for self-feeding Self-Feeding Abilities: Total assist Patient Positioning: Upright in bed Baseline Vocal Quality: Low vocal intensity Volitional Cough: Cognitively unable to elicit Volitional Swallow: Unable to elicit    Oral/Motor/Sensory Function Overall Oral Motor/Sensory Function: Within functional limits   Ice Chips Ice chips: Impaired Presentation: Spoon Oral Phase Impairments: Reduced labial seal;Reduced lingual movement/coordination Oral Phase Functional Implications: Oral holding Pharyngeal Phase Impairments: Suspected delayed Swallow;Multiple swallows   Thin Liquid Thin Liquid: Impaired Presentation: Straw;Spoon;Cup Oral Phase Impairments: Reduced labial seal;Reduced lingual movement/coordination Oral Phase Functional Implications: Oral holding Pharyngeal  Phase Impairments: Suspected delayed Swallow;Multiple swallows    Nectar Thick Nectar Thick Liquid: Not tested   Honey Thick Honey Thick Liquid: Not tested   Puree Puree: Impaired Presentation: Spoon Oral Phase Impairments: Reduced labial seal;Reduced lingual movement/coordination Oral Phase Functional  Implications: Oral holding Pharyngeal Phase Impairments: Suspected delayed Swallow;Multiple swallows   Solid     Solid: Not tested      Damien Blumenthal, M.A., CCC-SLP Speech Language Pathology, Acute Rehabilitation Services  Secure Chat preferred [phone]  07/30/2024,1:00 PM
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
## 2024-07-30 NOTE — Progress Notes (Signed)
 Pt with worsening AMS this morning, previously oriented x4 but now disoriented to time. Drowsiness worse than morning, questionably related to clonazepam  administration vs neuro change. RN notified Dr. Janjua, order given for stat head CT. Patient transported to and from CT without issue.   1145: Per Dr. Rosslyn, CT does demonstrate some acute changes post-op, no immediate surgical intervention needed at this time. Plan to obtain repeat CT at 1500 and discontinue all sedating medications. CCM notified and aware of plan.

## 2024-07-30 NOTE — Progress Notes (Signed)
 Discussed results of follow up CT scan with Dr. Rosslyn. Verbal order given for repeat CT head WO contrast timed for 2100. No other orders at this time.

## 2024-07-30 NOTE — Progress Notes (Signed)
 Occupational Therapy Treatment Patient Details Name: Ernest Romero MRN: [id number] DOB: 09-Dec-1965 Today's Date: 07/30/2024   History of present illness Pt is a 58 y.o. male presenting 10/14 from [facility] where he was admitted 9/28 for un[facility] episode in the jail. 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 On CIWA protocol. Transferred to [facility] for further management of craniotomy and flap. 10/27 s/p cranioplasty with bone flap replacement.  PMH alcohol use disorder, psychiatric disorder, HTN   OT comments  Patient in chair position in bed, RN cleared for pt to participate in OT as able.  RN reports patient lethargic today.  Patient awakens to his name, follows minimal commands with increased time but tends to drift off without constant stimulation.  He is able to state his name, place when given choices, and disoriented to month. He is able to squeeze L hand but does not squeeze R, requires max assist to wipe mouth and suction mouth with pt drooling and unable to manage his secretions.  RN notified and returns to assess patient, reports could be medication related but notified MD to assess as well.  Limited session, will re-assess next OT treatment. VSS stable.  Will follow.       If plan is discharge home, recommend the following:  Supervision due to cognitive status;Direct supervision/assist for medications management;Assistance with cooking/housework;Direct supervision/assist for financial management;Assist for transportation;Two people to help with walking and/or transfers;Two people to help with bathing/dressing/bathroom;Assistance with feeding   Equipment Recommendations  Other (comment) (defer)    Recommendations for Other Services      Precautions / Restrictions Precautions Precautions: Fall Recall of Precautions/Restrictions: Impaired Precaution/Restrictions Comments: helmet with mobility; BP  <160 Restrictions Weight Bearing Restrictions Per Provider Order: No       Mobility Bed Mobility                    Transfers                         Balance                                           ADL either performed or assessed with clinical judgement   ADL Overall ADL's : Needs assistance/impaired Eating/Feeding: Maximal assistance;Bed level Eating/Feeding Details (indicate cue type and reason): chair position in bed with max-total assist to wipe and suction mouth                                   General ADL Comments: limited due to lethargy    Extremity/Trunk Assessment Upper Extremity Assessment Upper Extremity Assessment: Difficult to assess due to impaired cognition;RUE deficits/detail;LUE deficits/detail (RN reports pt recieving medications, which could be contributing to drowsiness.) RUE Deficits / Details: pt able to briefly hold arm up when lifted for him, does not squeeze with R hand. LUE Deficits / Details: initally attempts to bring wash cloth to mouth but needing assist to wipe. pt unable to raise arm on command, when lifted pt unable to hold- drift noted. able to squeeze hand with increased time.            Vision       Perception     Praxis  Communication Communication Communication: Impaired Factors Affecting Communication: Reduced clarity of speech;Difficulty expressing self (level of arousal, slurred speech)   Cognition Arousal: Obtunded Behavior During Therapy: Flat affect Cognition: Difficult to assess Difficult to assess due to: Level of arousal Orientation impairments: Situation, Time         OT - Cognition Comments: patient oriented to self, place with choices, and voices April. He is very difficult to understand, slurred speech and drooling.  Pt following some simple 1 step commands with increased time, but inconsistent.                 Following commands:  Impaired Following commands impaired: Follows one step commands inconsistently, Follows one step commands with increased time      Cueing   Cueing Techniques: Verbal cues, Visual cues, Tactile cues  Exercises      Shoulder Instructions       General Comments RN present upon exit, assessing patient level of arousal and UE function- RN calling MD.    Pertinent Vitals/ Pain       Pain Assessment Pain Assessment: No/denies pain Pain Intervention(s): Monitored during session  Home Living                                          Prior Functioning/Environment              Frequency  Min 2X/week        Progress Toward Goals  OT Goals(current goals can now be found in the care plan section)  Progress towards OT goals: Not progressing toward goals - comment;OT to reassess next treatment (lethargy)  Acute Rehab OT Goals Patient Stated Goal: unable OT Goal Formulation: Patient unable to participate in goal setting Time For Goal Achievement: 08/04/24 Potential to Achieve Goals: Fair  Plan      Co-evaluation                 AM-PAC OT 6 Clicks Daily Activity     Outcome Measure   Help from another person eating meals?: Total Help from another person taking care of personal grooming?: A Lot Help from another person toileting, which includes using toliet, bedpan, or urinal?: Total Help from another person bathing (including washing, rinsing, drying)?: Total Help from another person to put on and taking off regular upper body clothing?: Total Help from another person to put on and taking off regular lower body clothing?: Total 6 Click Score: 7    End of Session    OT Visit Diagnosis: Other abnormalities of gait and mobility (R26.89);Muscle weakness (generalized) (M62.81);Other symptoms and signs involving the nervous system (R29.898);Cognitive communication deficit (R41.841);Other symptoms and signs involving cognitive function   Activity  Tolerance Patient limited by lethargy   Patient Left in bed;with call bell/phone within reach;with bed alarm set;with nursing/sitter in room   Nurse Communication Mobility status;Other (comment) (lethargy)        Time: 1030-1045 OT Time Calculation (min): 15 min  Charges: OT General Charges $OT Visit: 1 Visit OT Treatments $Self Care/Home Management : 8-22 mins  Etta NOVAK, OT Acute Rehabilitation Services Office [phone] Secure Chat Preferred    Etta GORMAN Hope 07/30/2024, 11:39 AM

## 2024-07-30 NOTE — Progress Notes (Addendum)
 Patient with worsening ams thought related to taking klonopin  this morning. NSG made aware and CT head ordered showing increase in size of subdural fluid collection underlying the left temporoparietal crani site measuring 15 mm w/ mild mass effect and slight rightward shift. NSG ordered repeat scan at 3 pm. Closely monitor neuro status. Will dc klonopin  and other sedating meds. Cont clevi for bp control.  JD Emilio RIGGERS [facility] 07/30/2024, 11:50 AM  Please see [email or web address] for pager details.  From 7A-7P if no response, please call [phone]. After hours, please call ELink [phone].

## 2024-07-31 ENCOUNTER — Inpatient Hospital Stay (HOSPITAL_COMMUNITY): Payer: MEDICAID

## 2024-07-31 ENCOUNTER — Encounter (HOSPITAL_COMMUNITY): Payer: Self-pay | Admitting: Neurosurgery

## 2024-07-31 DIAGNOSIS — E43 Unspecified severe protein-calorie malnutrition: Secondary | ICD-10-CM

## 2024-07-31 LAB — BASIC METABOLIC PANEL WITH GFR
Anion gap: 6 (ref 5–15)
BUN: 16 mg/dL (ref 6–20)
CO2: 20 mmol/L — ABNORMAL LOW (ref 22–32)
Calcium: 9 mg/dL (ref 8.9–10.3)
Chloride: 108 mmol/L (ref 98–111)
Creatinine, Ser: 1.09 mg/dL (ref 0.61–1.24)
GFR, Estimated: >60 mL/min (ref >60)
Glucose, Bld: 106 mg/dL — ABNORMAL HIGH (ref 70–99)
Potassium: 4.1 mmol/L (ref 3.5–5.1)
Sodium: 134 mmol/L — ABNORMAL LOW (ref 135–145)

## 2024-07-31 LAB — CBC
HCT: 42 % (ref 39.0–52.0)
Hemoglobin: 14.6 g/dL (ref 13.0–17.0)
MCH: 30 pg (ref 26.0–34.0)
MCHC: 34.8 g/dL (ref 30.0–36.0)
MCV: 86.2 fL (ref 80.0–100.0)
Platelets: 243 K/uL (ref 150–400)
RBC: 4.87 MIL/uL (ref 4.22–5.81)
RDW: 12.6 % (ref 11.5–15.5)
WBC: 12.8 K/uL — ABNORMAL HIGH (ref 4.0–10.5)
nRBC: 0 % (ref 0.0–0.2)

## 2024-07-31 LAB — MAGNESIUM: Magnesium: 2 mg/dL (ref 1.7–2.4)

## 2024-07-31 LAB — BLOOD GAS, VENOUS
Acid-Base Excess: 0.5 mmol/L (ref 0.0–2.0)
Bicarbonate: 24.6 mmol/L (ref 20.0–28.0)
Drawn by: 62344
O2 Saturation: 95.1 %
Patient temperature: 36.9
pCO2, Ven: 37 mmHg — ABNORMAL LOW (ref 44–60)
pH, Ven: 7.43 (ref 7.25–7.43)
pO2, Ven: 68 mmHg — ABNORMAL HIGH (ref 32–45)

## 2024-07-31 LAB — PHOSPHORUS: Phosphorus: 3.9 mg/dL (ref 2.5–4.6)

## 2024-07-31 LAB — GLUCOSE, CAPILLARY
Glucose-Capillary: 118 mg/dL — ABNORMAL HIGH (ref 70–99)
Glucose-Capillary: 113 mg/dL — ABNORMAL HIGH (ref 70–99)
Glucose-Capillary: 115 mg/dL — ABNORMAL HIGH (ref 70–99)
Glucose-Capillary: 135 mg/dL — ABNORMAL HIGH (ref 70–99)
Glucose-Capillary: 122 mg/dL — ABNORMAL HIGH (ref 70–99)
Glucose-Capillary: 114 mg/dL — ABNORMAL HIGH (ref 70–99)

## 2024-07-31 LAB — TRIGLYCERIDES: Triglycerides: 84 mg/dL (ref <150)

## 2024-07-31 LAB — AMMONIA: Ammonia: 48 umol/L — ABNORMAL HIGH (ref 9–35)

## 2024-07-31 MED ORDER — ACETAMINOPHEN 650 MG RE SUPP: 650.0000 mg | RECTAL | Status: DC | PRN

## 2024-07-31 MED ORDER — DOCUSATE SODIUM 50 MG/5ML PO LIQD
100.0000 mg | Freq: Every day | ORAL | Status: DC
  Administered 2024-07-31 – 2024-08-15 (×12): 100 mg
  Filled 2024-07-31 (×16): qty 10

## 2024-07-31 MED ORDER — AMLODIPINE BESYLATE 5 MG PO TABS
10.0000 mg | ORAL_TABLET | Freq: Every day | ORAL | Status: DC
  Administered 2024-07-31 – 2024-08-10 (×11): 10 mg
  Filled 2024-07-31 (×4): qty 1
  Filled 2024-07-31 (×2): qty 2
  Filled 2024-07-31 (×4): qty 1
  Filled 2024-07-31: qty 2

## 2024-07-31 MED ORDER — LITHIUM CARBONATE ER 450 MG PO TBCR: 450.0000 mg | EXTENDED_RELEASE_TABLET | Freq: Every day | ORAL | Status: DC

## 2024-07-31 MED ORDER — ADULT MULTIVITAMIN W/MINERALS CH
1.0000 | ORAL_TABLET | Freq: Every day | ORAL | Status: DC
  Administered 2024-07-31 – 2024-08-10 (×11): 1
  Filled 2024-07-31 (×11): qty 1

## 2024-07-31 MED ORDER — HALOPERIDOL LACTATE 5 MG/ML IJ SOLN: 5.0000 mg | Freq: Four times a day (QID) | INTRAMUSCULAR | Status: DC | PRN

## 2024-07-31 MED ORDER — BISACODYL 10 MG RE SUPP
10.0000 mg | Freq: Once | RECTAL | Status: AC
  Administered 2024-07-31: 10 mg via RECTAL
  Filled 2024-07-31: qty 1

## 2024-07-31 MED ORDER — FOLIC ACID 1 MG PO TABS
1.0000 mg | ORAL_TABLET | Freq: Every day | ORAL | Status: DC
  Administered 2024-07-31 – 2024-08-10 (×11): 1 mg
  Filled 2024-07-31 (×11): qty 1

## 2024-07-31 MED ORDER — THIAMINE MONONITRATE 100 MG PO TABS
100.0000 mg | ORAL_TABLET | Freq: Every day | ORAL | Status: DC
  Administered 2024-07-31 – 2024-08-11 (×12): 100 mg
  Filled 2024-07-31 (×12): qty 1

## 2024-07-31 MED ORDER — LITHIUM CARBONATE ER 450 MG PO TBCR
450.0000 mg | EXTENDED_RELEASE_TABLET | Freq: Every day | ORAL | Status: DC
  Filled 2024-07-31: qty 1

## 2024-07-31 MED ORDER — PANTOPRAZOLE SODIUM 40 MG IV SOLR
40.0000 mg | INTRAVENOUS | Status: DC
  Administered 2024-07-31: 40 mg via INTRAVENOUS
  Filled 2024-07-31: qty 10

## 2024-07-31 MED ORDER — ORAL CARE MOUTH RINSE
15.0000 mL | OROMUCOSAL | Status: DC
  Administered 2024-07-31 – 2024-08-01 (×4): 15 mL via OROMUCOSAL

## 2024-07-31 MED ORDER — OSMOLITE 1.5 CAL PO LIQD
1000.0000 mL | ORAL | Status: DC
  Administered 2024-07-31 – 2024-08-02 (×2): 1000 mL

## 2024-07-31 MED ORDER — MUPIROCIN 2 % EX OINT
TOPICAL_OINTMENT | Freq: Two times a day (BID) | CUTANEOUS | Status: AC
  Administered 2024-07-31 – 2024-08-01 (×3): 1 via NASAL
  Filled 2024-07-31: qty 22

## 2024-07-31 MED ORDER — PROSOURCE TF20 ENFIT COMPATIBL EN LIQD
60.0000 mL | Freq: Every day | ENTERAL | Status: DC
  Administered 2024-07-31 – 2024-08-05 (×5): 60 mL
  Filled 2024-07-31 (×5): qty 60

## 2024-07-31 MED ORDER — ACETAMINOPHEN 325 MG PO TABS
650.0000 mg | ORAL_TABLET | ORAL | Status: DC | PRN
  Administered 2024-07-31 – 2024-08-16 (×10): 650 mg
  Filled 2024-07-31 (×11): qty 2

## 2024-07-31 MED ORDER — BUTALBITAL-APAP-CAFFEINE 50-325-40 MG PO TABS
2.0000 | ORAL_TABLET | ORAL | Status: DC | PRN
  Administered 2024-08-04 – 2024-08-05 (×2): 2
  Filled 2024-07-31 (×3): qty 2

## 2024-07-31 MED ORDER — SENNA 8.6 MG PO TABS
1.0000 | ORAL_TABLET | Freq: Every day | ORAL | Status: DC
  Administered 2024-07-31 – 2024-08-11 (×11): 8.6 mg
  Filled 2024-07-31 (×13): qty 1

## 2024-07-31 MED ORDER — LURASIDONE HCL 40 MG PO TABS
40.0000 mg | ORAL_TABLET | Freq: Every evening | ORAL | Status: DC
Start: 2024-07-31 — End: 2024-07-31

## 2024-07-31 MED ORDER — LACTULOSE 10 GM/15ML PO SOLN
10.0000 g | Freq: Two times a day (BID) | ORAL | Status: AC
  Administered 2024-07-31 – 2024-08-01 (×2): 10 g
  Filled 2024-07-31 (×2): qty 15

## 2024-07-31 MED ORDER — LURASIDONE HCL 40 MG PO TABS
40.0000 mg | ORAL_TABLET | Freq: Every evening | ORAL | Status: DC
  Filled 2024-07-31: qty 1

## 2024-07-31 MED ORDER — ORAL CARE MOUTH RINSE: 15.0000 mL | OROMUCOSAL | Status: DC | PRN

## 2024-07-31 MED ORDER — HALOPERIDOL 5 MG PO TABS: 5.0000 mg | ORAL_TABLET | Freq: Four times a day (QID) | ORAL | Status: DC | PRN

## 2024-07-31 MED ORDER — LURASIDONE HCL 40 MG PO TABS
40.0000 mg | ORAL_TABLET | Freq: Every evening | ORAL | Status: DC
  Administered 2024-07-31: 40 mg
  Filled 2024-07-31: qty 1

## 2024-07-31 NOTE — Progress Notes (Addendum)
 NAME:  BRADAN CONGROVE, MRN:  [id number], DOB:  Nov 19, 1965, LOS: 16 ADMISSION DATE:  07/15/2024, CONSULTATION DATE: 07/29/2024 REFERRING MD:  JONELLE Sable MD, CHIEF COMPLAINT:   Postcraniotomy and bone flap  History of Present Illness:  58 year old male with past medical history of alcohol abuse, bipolar disorder, panic disorder who presented to [facility] from jail in September for large subdural hematoma s/p left frontal craniotomy and evacuation of bleed. He was transferred from [facility] to [facility] for flap replacement. While at [facility], patient was on alcohol withdrawal taper of phenobarbital which was discontinued prior to transfer.   Pertinent  Medical History    has a past medical history of Anxiety, Bipolar 1 disorder (HCC), Depression, Hepatitis, and Hypertension.   Significant Hospital Events: Including procedures, antibiotic start and stop dates in addition to other pertinent events   10/27 crani w/ flap replacement 10/29: off clevi, liberate SBP goal, changing psych meds  Interim History / Subjective:  Remove aline. Liberate SBP goal to <180. Stopping cleviprex. Dr. Janjua preferring to stop antipsychotics as patient is sedated and confounding neuro exam. Psychiatry has been contacted to help with taper given he is on high dose lithium  and latuda . D/c sitter.   Objective    Blood pressure 119/88, pulse (!) 102, temperature 99.2 F (37.3 C), temperature source Oral, resp. rate (!) 27, height 5' 10 (1.778 m), weight 67.5 kg, SpO2 92%.        Intake/Output Summary (Last 24 hours) at 07/31/2024 9166 Last data filed at 07/31/2024 0700 Gross per 24 hour  Intake 1003.15 ml  Output 1675 ml  Net -671.85 ml   Filed Weights   07/16/24 0800  Weight: 67.5 kg    Examination: General: middle aged male, acute on chronically ill appearing HEENT: large post operative incision site with staples, cdi, perrla Neuro: somnolent; tells me name. No facial droop. ?sensation. He moves BUE with repeated  commands. Withdraws LLE to painful stimulus but no withdrawal of RLE to painful stimulus. Will attempt resistance to gravity if dropping leg from height  CV: s1s2, RRR, no m/r/g PULM: resp even and unlabored; shallow breaths; diminished bases, rhonchi L>R GI: soft, bsx4 active  Extremities: warm/dry, no edema  Resolved problem list   Assessment and Plan  Subdural hematoma s/p left cranioplasty 10/27 Status post frontoparietal craniotomy on 06/30/2024 Underwent left-sided allograft cranioplasty, flap 10/27 - nsgy following, appreciate management  - liberate SBP goal to < 180 - cleviprex stopped - remove a-line  - amlodipine  10mg  daily, prn labetalol   - neuro checks - limit sedating meds - see problem below for management  - Continue neuroprotective measures- normothermia, euglycemia, HOB greater than 30, head in neutral alignment, normocapnia, normoxia.  - pt/ot/slp  Bipolar disorder - psych following  - will herbalist, does not need si/hi sitter  - Dr. Sable concerned about sedating effect of lithium  and Latuda . He has requested d/c these medications. Psychiatry has been looped in by 4N PharmD Vermell and will allow psychiatry to manage tapering down these medications appropriately  -Haldol  IV as needed -Lacks decision-making capacity as per psychiatry team  History of alcohol, tobacco abuse -Was on a phenobarb taper at [facility]. No currently withdrawal symptoms - nicotine patch  - thiamine , folic acid , multivitamin  - monitor for withdrawal symptoms, may need reinstatement of CIWA vs lower dose Lithium  and Latuda  as above - issues with sedation/somnolence and clouding neuro exam with SDH. Need to balance   HTN - amlodipine  10mg  daily  -  prn labetalol    Psychiatry requesting stay in ICU for 24 more hours while they taper psychiatric medications.   Critical care time: na; IP time: 26m   Tinnie FORBES Furth, PA-C [facility] 07/31/24 10:00 AM  Please see  [email or web address] for pager details.  From 7A-7P if no response, please call [phone] After hours, please call ELink [phone]

## 2024-07-31 NOTE — Procedures (Signed)
 Cortrak  Person Inserting Tube:  Ernest Romero, RD Tube Type:  Cortrak - 43 inches Tube Size:  10 Tube Location:  Left nare Secured by: Bridle Initial Placement:  Gastric Technique Used to Measure Tube Placement:  Marking at nare/corner of mouth Cortrak Secured At:  88 cm Initial Placement Verification:  Xray  Cortrak Tube Team Note:  Consult received to place a Cortrak feeding tube.   X ray ordered to confirm placement.  If the tube becomes dislodged please keep the tube and contact the Cortrak team at [email or web address] for replacement.  If after hours and replacement cannot be delayed, place a NG tube and confirm placement with an abdominal x-ray.    Ernest Elihue, MS, RDN, LDN Clinical Dietitian I Please reach out via secure chat

## 2024-07-31 NOTE — Progress Notes (Signed)
 Speech Language Pathology Treatment: Dysphagia  Patient Details Name: Ernest Romero MRN: [id number] DOB: 1966/09/08 Today's Date: 07/31/2024 Time: 9065-9055 SLP Time Calculation (min) (ACUTE ONLY): 10 min  Assessment / Plan / Recommendation Clinical Impression  Recommend NPO status be maintained with the exception of ice chips and essential meds, given crushed in puree. His level of alertness is a significant barrier to resuming POs. Discussed with RN and RD, who report plan to place Cortrak as able today.   Pt is lethargic, requiring Max cueing to attend to functional tasks. RN reports significant fluctuations between agitation and lethargy. Oral transit is prolonged with tspns of thin liquid and puree, followed by multiple swallows and immediate coughing. He is not currently demonstrating readiness for POs but SLP will f/u for ongoing assessment.    HPI HPI: 58 yo male with history of alcohol use disorder, psychiatric disorder, and HTN, who presents 10/14 from [facility], where he was admitted 9/28 after an unresponsive episode in jail. Found to have large SDH s/p L frontoparietal craniotomy with evacuation of SDH and drain placement 9/28 (removed 9/29). Followed by SLP for cognitive-linguistic goals and on a regular diet prior to transfer. Transferred to [facility] for further management of craniotomy and flab. S/p cranioplasty with bone flap replacement 10/27. Change in status thought to be related to Klonopin  administration 10/28, repeat CTH shows significant increase in size of mixed attenuation subdural fluid collection underlying the L tempoparietal craniotomy site, now measuring approximately 15 mm in thickness, with associated mild mass effect and slight rightward midline shift.      SLP Plan  Continue with current plan of care          Recommendations  Diet recommendations: NPO Medication Administration: Via alternative means                  Oral care QID;Oral care prior to ice  chip/H20   Frequent or constant Supervision/Assistance Dysphagia, unspecified (R13.10)     Continue with current plan of care     Damien Blumenthal, M.A., CCC-SLP Speech Language Pathology, Acute Rehabilitation Services  Secure Chat preferred [phone]   07/31/2024, 10:01 AM

## 2024-07-31 NOTE — Progress Notes (Incomplete)
 Attending note: I have seen and examined the patient. History, labs and imaging reviewed.  Blood pressure (!) 126/93, pulse 75, temperature 99.2 F (37.3 C), temperature source Oral, resp. rate 16, height 5' 10 (1.778 m), weight 67.5 kg, SpO2 92%. Gen:      No acute distress HEENT:  EOMI, sclera anicteric Neck:     No masses; no thyromegaly Lungs:    Clear to auscultation bilaterally; normal respiratory effort*** CV:         Regular rate and rhythm; no murmurs Abd:      + bowel sounds; soft, non-tender; no palpable masses, no distension Ext:    No edema; adequate peripheral perfusion Neuro: alert and oriented x 3 Psych: normal mood and affect   Labs/Imaging personally reviewed, significant for   Assessment/plan:  Stable off cleveprix Cortrak today for PO meds Reduce  Stable for transfer to PCU. DC a line   The patient is critically ill with multiple organ systems failure and requires high complexity decision making for assessment and support, frequent evaluation and titration of therapies, application of advanced monitoring technologies and extensive interpretation of multiple databases.  Critical care time - 35 mins. This represents my time independent of the NPs time taking care of the pt.  Kikuye Korenek MD [facility] 07/31/2024, 9:31 AM

## 2024-07-31 NOTE — Progress Notes (Addendum)
 Nutrition Brief Note  Pt discussed during ICU rounds and with RN and MD.    Initiate tube feeding via Cortrak tube: Osmolite 1.5 at 20 ml/h and increase by 10 ml every 12 hours to goal rate of 60 ml/hr(1440 ml per day)  Prosource TF20 60 ml daily  Provides 2240 kcal, 110 gm protein, 1097 ml free water daily  Continue:  100 mg thiamine  daily MVI with minerals daily 1 mg folic acid  daily   No current phos or magnesium available for reviewed, now ordered.   Monitor magnesium and phosphorus daily x 4 occurrences, MD to replete as needed, as pt is at risk for refeeding syndrome given pt meets criteria for severe acute malnutrition.   9/28 - admitted to [facility] with SDH s/p post prontoparietal craniotomy with evacuation 10/13 - tx to [facility] 10/27 - s/p L allograft cranioplasty  10/29 - cortrak tube placed; xray with tip in descending duodenum    Ananda Sitzer P., RD, LDN, CNSC See AMiON for contact information

## 2024-07-31 NOTE — Progress Notes (Signed)
 Physical Therapy Treatment Patient Details Name: Ernest Romero MRN: [id number] DOB: 1966-04-20 Today's Date: 07/31/2024   History of Present Illness Pt is a 58 y.o. male presenting 10/14 from [facility] where he was admitted 9/28 for unresponsive episode in the jail. 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 On CIWA protocol. Transferred to [facility] for further management of craniotomy and flap. 10/27 s/p cranioplasty with bone flap replacement. 10/28 Change in status thought to be related to Klonopin  administration 10/28, repeat CTH shows significant increase in size of mixed attenuation subdural fluid collection underlying the L tempoparietal craniotomy site, now measuring approximately 15 mm in thickness, with associated mild mass effect and slight rightward midline shift. PMH alcohol use disorder, psychiatric disorder, HTN    PT Comments  Pt re-evaled as a co-session with OT as pt with change in mental status and regression in functional mobility. Pt unable to maintain eyes open t/o session, pt with minimal vocalization, simple command follow < 50% of time with maximal multimodal cues, totalAx2 to transfer EOB, and noted minimal active movement x4 extremities vs tone vs rigidity. Acute PT to cont to monitor, assess, and progress mobility as able. Pt remains appropriate for inpatient rehab program < 3 hrs a day to allow for increased time to achieve maximal functional recovery.   If plan is discharge home, recommend the following: A lot of help with bathing/dressing/bathroom;Assistance with cooking/housework;Direct supervision/assist for medications management;Direct supervision/assist for financial management;Assist for transportation;Help with stairs or ramp for entrance;Supervision due to cognitive status;A little help with walking and/or transfers   Can travel by private vehicle     Yes  Equipment Recommendations  Other  (comment) (TBD at next venue)    Recommendations for Other Services       Precautions / Restrictions Precautions Precautions: Fall Recall of Precautions/Restrictions: Impaired Precaution/Restrictions Comments: helmet with mobility; BP <180, cortrak Restrictions Weight Bearing Restrictions Per Provider Order: No     Mobility  Bed Mobility Overal bed mobility: Needs Assistance Bed Mobility: Supine to Sit, Sit to Supine Rolling: Total assist, +2 for physical assistance   Supine to sit: Total assist, +2 for physical assistance     General bed mobility comments: maxA to maintain balance at EOB, pt with no righting response when posterior support removed    Transfers                   General transfer comment: deferred due to lethargy    Ambulation/Gait               General Gait Details: unable this date   Stairs             Wheelchair Mobility     Tilt Bed    Modified Rankin (Stroke Patients Only)       Balance Overall balance assessment: Needs assistance Sitting-balance support: Feet supported, No upper extremity supported Sitting balance-Leahy Scale: Poor Sitting balance - Comments: significant assist to maintain upright at EOB, pt with posterior lean and pushing with L UE towards R.  grossly max assist, needing total assist at times. Postural control: Posterior lean     Standing balance comment: deferred                            Communication Communication Communication: Impaired Factors Affecting Communication: Reduced clarity of speech;Difficulty expressing self  Cognition Arousal: Obtunded Behavior During  Therapy: Flat affect                           PT - Cognition Comments: pt with brief periods of eye opening but unable to maintain, pt did shake head no when asked if last name was claudene and then yes to last name [name].  Pt attempting to vocalize occasionally t/o session. Pt following simple  commands < 50% of time with max multimodal cuing Following commands: Impaired Following commands impaired: Follows one step commands inconsistently, Follows one step commands with increased time    Cueing Cueing Techniques: Verbal cues, Visual cues, Tactile cues  Exercises      General Comments General comments (skin integrity, edema, etc.): Pt with L cranial incision with stitches, no drainage. VSS      Pertinent Vitals/Pain Pain Assessment Pain Assessment: Faces Faces Pain Scale: No hurt    Home Living                          Prior Function            PT Goals (current goals can now be found in the care plan section) Progress towards PT goals: Not progressing toward goals - comment (pt with change in mental status)    Frequency    Min 2X/week      PT Plan      Co-evaluation PT/OT/SLP Co-Evaluation/Treatment: Yes Reason for Co-Treatment: For patient/therapist safety;To address functional/ADL transfers;Necessary to address cognition/behavior during functional activity PT goals addressed during session: Mobility/safety with mobility OT goals addressed during session: ADL's and self-care      AM-PAC PT 6 Clicks Mobility   Outcome Measure  Help needed turning from your back to your side while in a flat bed without using bedrails?: Total Help needed moving from lying on your back to sitting on the side of a flat bed without using bedrails?: Total Help needed moving to and from a bed to a chair (including a wheelchair)?: Total Help needed standing up from a chair using your arms (e.g., wheelchair or bedside chair)?: Total Help needed to walk in hospital room?: Total Help needed climbing 3-5 steps with a railing? : Total 6 Click Score: 6    End of Session   Activity Tolerance: Patient limited by lethargy Patient left: in bed;with call bell/phone within reach;with bed alarm set Nurse Communication: Mobility status PT Visit Diagnosis: Other symptoms  and signs involving the nervous system (R29.898);Muscle weakness (generalized) (M62.81)     Time: 8799-8774 PT Time Calculation (min) (ACUTE ONLY): 25 min  Charges:      PT General Charges $$ ACUTE PT VISIT: 1 Visit                     Norene Ames, PT, DPT Acute Rehabilitation Services Secure chat preferred Office #: [phone]    Norene CHRISTELLA Ames 07/31/2024, 1:51 PM

## 2024-07-31 NOTE — Progress Notes (Signed)
 Afternoon round, stable  No increased agitation yet  Psychiatry requested to keep patient in ICU another 24 hours under CCM with changing of psychiatric medications.  Will reassess overnight into tomorrow and possibly move out to SDU tomorrow.   Tinnie FORBES Furth, PA-C [facility] 07/31/24 6:09 PM  Please see [email or web address] for pager details.  From 7A-7P if no response, please call [phone] After hours, please call ELink [phone]

## 2024-07-31 NOTE — Progress Notes (Signed)
 Occupational Therapy Treatment Patient Details Name: Ernest Romero MRN: [id number] DOB: 11-Feb-1966 Today's Date: 07/31/2024   History of present illness Pt is a 58 y.o. male presenting 10/14 from [facility] where he was [facility] in [facility]. 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 On CIWA protocol. Transferred to [facility] for further management of craniotomy and flap. 10/27 s/p cranioplasty with bone flap replacement. 10/28 Change in status thought to be related to Klonopin  administration 10/28, repeat CTH shows significant increase in size of mixed attenuation subdural fluid collection underlying the L tempoparietal craniotomy site, now measuring approximately 15 mm in thickness, with associated mild mass effect and slight rightward midline shift. PMH alcohol use disorder, psychiatric disorder, HTN   OT comments  Pt seen with PT to optimize participation and safety. Pt maintains alertness briefly in bed, improved once up on EOB.  Follows simple 1 step commands with max cueing and with approx 50% accuracy given increased time.  Requires total assist +2 for bed mobility and Adls.  Max to total assist sitting EOB with posterior lean and no righting reactions, pushing with L hand to the R.  Moving L UE more than R UE but difficult to determine true function due to lethargy.  Will follow acutely. Continue to recommend <3hrs/day inpatient setting at dc.       If plan is discharge home, recommend the following:  Supervision due to cognitive status;Direct supervision/assist for medications management;Assistance with cooking/housework;Direct supervision/assist for financial management;Assist for transportation;Two people to help with walking and/or transfers;Two people to help with bathing/dressing/bathroom;Assistance with feeding (total care)   Equipment Recommendations  Other (comment) (defer)     Recommendations for Other Services      Precautions / Restrictions Precautions Precautions: Fall Recall of Precautions/Restrictions: Impaired Precaution/Restrictions Comments: helmet with mobility; BP <180, cortrak Restrictions Weight Bearing Restrictions Per Provider Order: No       Mobility Bed Mobility Overal bed mobility: Needs Assistance Bed Mobility: Supine to Sit, Sit to Supine Rolling: Total assist, +2 for physical assistance   Supine to sit: Total assist, +2 for physical assistance          Transfers                   General transfer comment: deferred     Balance Overall balance assessment: Needs assistance Sitting-balance support: Feet supported, No upper extremity supported Sitting balance-Leahy Scale: Poor Sitting balance - Comments: significant assist to maintain upright at EOB, pt with posterior lean and pushing with L UE towards R.  grossly max assist, needing total assist at times. Postural control: Posterior lean     Standing balance comment: deferred                           ADL either performed or assessed with clinical judgement   ADL Overall ADL's : Needs assistance/impaired Eating/Feeding: NPO   Grooming: Wash/dry face;Wash/dry hands;Maximal assistance;Sitting;Bed level           Upper Body Dressing : Total assistance;Sitting   Lower Body Dressing: Total assistance;+2 for physical assistance;Sitting/lateral leans     Toilet Transfer Details (indicate cue type and reason): defer         Functional mobility during ADLs: Total assistance;+2 for physical assistance      Extremity/Trunk Assessment Upper Extremity Assessment Upper Extremity Assessment: Difficult to assess due to impaired  cognition RUE Deficits / Details: pt attempt to bring UE towards therapist to give high five, moves R hand when asked for thumbs up but does not squeeze hand.  Tone vs resistance in elbow? LUE Deficits / Details: brings L hand  towards face when therapist uses wash cloth to increased alertness, attempted [facility] but needing max assist.  pt able to squeeze hand minimally and show 2 fingers but unable to keep arm up when lifted.   Lower Extremity Assessment Lower Extremity Assessment: Defer to PT evaluation        Vision   Additional Comments: contiue to assess, pt opens eyes to loud sound but gazecentral and does not appear to scan.  will need further assessment.   Perception Perception Perception: Impaired   Praxis     Communication Communication Communication: Impaired Factors Affecting Communication: Reduced clarity of speech;Difficulty expressing self   Cognition Arousal: Obtunded Behavior During Therapy: Flat affect Cognition: Difficult to assess Difficult to assess due to: Level of arousal Orientation impairments: Time, Situation Awareness: Intellectual awareness impaired   Attention impairment (select first level of impairment): Focused attention Executive functioning impairment (select all impairments): Organization, Sequencing, Reasoning, Problem solving, Initiation OT - Cognition Comments: pt able to correctly identify his last name but unable to state when asked, able to state place when given 2 choices but not month.  needs constant max stimulation to follow ~50% of commands.                 Following commands: Impaired Following commands impaired: Follows one step commands inconsistently, Follows one step commands with increased time      Cueing   Cueing Techniques: Verbal cues, Visual cues, Tactile cues  Exercises      Shoulder Instructions       General Comments      Pertinent Vitals/ Pain       Pain Assessment Pain Assessment: Faces Faces Pain Scale: No hurt Pain Intervention(s): Monitored during session  Home Living                                          Prior Functioning/Environment              Frequency  Min 2X/week        Progress  Toward Goals  OT Goals(current goals can now be found in the care plan section)  Progress towards OT goals: Not progressing toward goals - comment;Goals drowngraded-see care plan  Acute Rehab OT Goals OT Goal Formulation: Patient unable to participate in goal setting Time For Goal Achievement: 08/14/24 Potential to Achieve Goals: Fair  Plan      Co-evaluation    PT/OT/SLP Co-Evaluation/Treatment: Yes Reason for Co-Treatment: For patient/therapist safety;To address functional/ADL transfers;Necessary to address cognition/behavior during functional activity PT goals addressed during session: Mobility/safety with mobility OT goals addressed during session: ADL's and self-care      AM-PAC OT 6 Clicks Daily Activity     Outcome Measure   Help from another person eating meals?: Total Help from another person taking care of personal grooming?: A Lot Help from another person toileting, which includes using toliet, bedpan, or urinal?: Total Help from another person bathing (including washing, rinsing, drying)?: Total Help from another person to put on and taking off regular upper body clothing?: Total Help from another person to put on and taking off regular lower body clothing?: Total 6  Click Score: 7    End of Session    OT Visit Diagnosis: Other abnormalities of gait and mobility (R26.89);Muscle weakness (generalized) (M62.81);Other symptoms and signs involving the nervous system (R29.898);Cognitive communication deficit (R41.841);Other symptoms and signs involving cognitive function   Activity Tolerance Patient limited by lethargy   Patient Left in bed;with call bell/phone within reach;with bed alarm set;with SCD's reapplied   Nurse Communication Mobility status        Time: 8799-8774 OT Time Calculation (min): 25 min  Charges: OT General Charges $OT Visit: 1 Visit OT Treatments $Self Care/Home Management : 8-22 mins  Etta NOVAK, OT Acute Rehabilitation  Services Office [phone] Secure Chat Preferred    Etta GORMAN [place] 07/31/2024, 12:55 PM
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
## 2024-08-01 ENCOUNTER — Inpatient Hospital Stay (HOSPITAL_COMMUNITY): Payer: MEDICAID

## 2024-08-01 ENCOUNTER — Encounter (HOSPITAL_COMMUNITY): Payer: Self-pay | Source: Home / Self Care | Attending: Family Medicine

## 2024-08-01 ENCOUNTER — Encounter (HOSPITAL_COMMUNITY): Payer: Self-pay | Admitting: Pulmonary Disease

## 2024-08-01 ENCOUNTER — Inpatient Hospital Stay (HOSPITAL_COMMUNITY): Payer: MEDICAID | Admitting: Registered Nurse

## 2024-08-01 DIAGNOSIS — F418 Other specified anxiety disorders: Secondary | ICD-10-CM

## 2024-08-01 DIAGNOSIS — S06360A Traumatic hemorrhage of cerebrum, unspecified, without loss of consciousness, initial encounter: Secondary | ICD-10-CM

## 2024-08-01 DIAGNOSIS — F314 Bipolar disorder, current episode depressed, severe, without psychotic features: Secondary | ICD-10-CM

## 2024-08-01 DIAGNOSIS — I10 Essential (primary) hypertension: Secondary | ICD-10-CM

## 2024-08-01 DIAGNOSIS — Z87891 Personal history of nicotine dependence: Secondary | ICD-10-CM

## 2024-08-01 DIAGNOSIS — F1010 Alcohol abuse, uncomplicated: Secondary | ICD-10-CM

## 2024-08-01 HISTORY — PX: BURR HOLE: SHX908

## 2024-08-01 LAB — CBC
HCT: 45.5 % (ref 39.0–52.0)
Hemoglobin: 15.4 g/dL (ref 13.0–17.0)
MCH: 29.7 pg (ref 26.0–34.0)
MCHC: 33.8 g/dL (ref 30.0–36.0)
MCV: 87.8 fL (ref 80.0–100.0)
Platelets: 285 K/uL (ref 150–400)
RBC: 5.18 MIL/uL (ref 4.22–5.81)
RDW: 12.9 % (ref 11.5–15.5)
WBC: 14.3 K/uL — ABNORMAL HIGH (ref 4.0–10.5)
nRBC: 0 % (ref 0.0–0.2)

## 2024-08-01 LAB — GLUCOSE, CAPILLARY
Glucose-Capillary: 107 mg/dL — ABNORMAL HIGH (ref 70–99)
Glucose-Capillary: 117 mg/dL — ABNORMAL HIGH (ref 70–99)
Glucose-Capillary: 112 mg/dL — ABNORMAL HIGH (ref 70–99)
Glucose-Capillary: 132 mg/dL — ABNORMAL HIGH (ref 70–99)
Glucose-Capillary: 134 mg/dL — ABNORMAL HIGH (ref 70–99)

## 2024-08-01 LAB — BASIC METABOLIC PANEL WITH GFR
Anion gap: 12 (ref 5–15)
BUN: 22 mg/dL — ABNORMAL HIGH (ref 6–20)
CO2: 22 mmol/L (ref 22–32)
Calcium: 9.4 mg/dL (ref 8.9–10.3)
Chloride: 106 mmol/L (ref 98–111)
Creatinine, Ser: 0.97 mg/dL (ref 0.61–1.24)
GFR, Estimated: >60 mL/min (ref >60)
Glucose, Bld: 112 mg/dL — ABNORMAL HIGH (ref 70–99)
Potassium: 4.1 mmol/L (ref 3.5–5.1)
Sodium: 140 mmol/L (ref 135–145)

## 2024-08-01 LAB — AMMONIA: Ammonia: 33 umol/L (ref 9–35)

## 2024-08-01 LAB — PROCALCITONIN: Procalcitonin: 0.15 ng/mL

## 2024-08-01 LAB — PHOSPHORUS: Phosphorus: 2.8 mg/dL (ref 2.5–4.6)

## 2024-08-01 LAB — MAGNESIUM: Magnesium: 2.4 mg/dL (ref 1.7–2.4)

## 2024-08-01 MED ORDER — 0.9 % SODIUM CHLORIDE (POUR BTL) OPTIME
TOPICAL | Status: DC | PRN
Start: 2024-08-01 — End: 2024-08-01
  Administered 2024-08-01 (×2): 1000 mL

## 2024-08-01 MED ORDER — FENTANYL CITRATE (PF) 100 MCG/2ML IJ SOLN
INTRAMUSCULAR | Status: AC
  Filled 2024-08-01: qty 2

## 2024-08-01 MED ORDER — ALBUMIN HUMAN 5 % IV SOLN: INTRAVENOUS | Status: DC | PRN

## 2024-08-01 MED ORDER — HYDRALAZINE HCL 20 MG/ML IJ SOLN
INTRAMUSCULAR | Status: AC
  Filled 2024-08-01: qty 1

## 2024-08-01 MED ORDER — BACITRACIN ZINC 500 UNIT/GM EX OINT
TOPICAL_OINTMENT | CUTANEOUS | Status: DC | PRN
  Administered 2024-08-01: 1 via TOPICAL

## 2024-08-01 MED ORDER — EPHEDRINE SULFATE-NACL 50-0.9 MG/10ML-% IV SOSY
PREFILLED_SYRINGE | INTRAVENOUS | Status: DC | PRN
  Administered 2024-08-01: 10 mg via INTRAVENOUS
  Administered 2024-08-01: 7.5 mg via INTRAVENOUS
  Administered 2024-08-01: 5 mg via INTRAVENOUS
  Administered 2024-08-01: 7.5 mg via INTRAVENOUS
  Administered 2024-08-01 (×2): 5 mg via INTRAVENOUS

## 2024-08-01 MED ORDER — CEFAZOLIN SODIUM-DEXTROSE 2-3 GM-%(50ML) IV SOLR
INTRAVENOUS | Status: DC | PRN
  Administered 2024-08-01: 2 g via INTRAVENOUS

## 2024-08-01 MED ORDER — LIDOCAINE-EPINEPHRINE 1 %-1:100000 IJ SOLN
INTRAMUSCULAR | Status: AC
  Filled 2024-08-01: qty 1

## 2024-08-01 MED ORDER — HYDRALAZINE HCL 20 MG/ML IJ SOLN
10.0000 mg | Freq: Once | INTRAMUSCULAR | Status: AC
  Administered 2024-08-01: 10 mg via INTRAVENOUS

## 2024-08-01 MED ORDER — LIDOCAINE-EPINEPHRINE 1 %-1:100000 IJ SOLN
INTRAMUSCULAR | Status: DC | PRN
  Administered 2024-08-01: 8 mL

## 2024-08-01 MED ORDER — CLEVIDIPINE BUTYRATE 0.5 MG/ML IV EMUL: 0.0000 mg/h | INTRAVENOUS | Status: DC

## 2024-08-01 MED ORDER — ONDANSETRON HCL 4 MG/2ML IJ SOLN
INTRAMUSCULAR | Status: AC
  Filled 2024-08-01: qty 2

## 2024-08-01 MED ORDER — PROPOFOL 10 MG/ML IV BOLUS
INTRAVENOUS | Status: AC
  Filled 2024-08-01: qty 20

## 2024-08-01 MED ORDER — PROPOFOL 10 MG/ML IV BOLUS
INTRAVENOUS | Status: DC | PRN
  Administered 2024-08-01: 150 mg via INTRAVENOUS

## 2024-08-01 MED ORDER — PROPOFOL 10 MG/ML IV BOLUS: INTRAVENOUS | Status: DC | PRN

## 2024-08-01 MED ORDER — VANCOMYCIN HCL 1000 MG IV SOLR
INTRAVENOUS | Status: DC | PRN
  Administered 2024-08-01: 1000 mg

## 2024-08-01 MED ORDER — LIDOCAINE 2% (20 MG/ML) 5 ML SYRINGE
INTRAMUSCULAR | Status: AC
  Filled 2024-08-01: qty 5

## 2024-08-01 MED ORDER — CHLORHEXIDINE GLUCONATE 0.12 % MT SOLN: 15.0000 mL | Freq: Once | OROMUCOSAL | Status: AC

## 2024-08-01 MED ORDER — VANCOMYCIN HCL 1000 MG IV SOLR
INTRAVENOUS | Status: AC
  Filled 2024-08-01: qty 20

## 2024-08-01 MED ORDER — POTASSIUM CHLORIDE IN NACL 20-0.9 MEQ/L-% IV SOLN
INTRAVENOUS | Status: DC
  Filled 2024-08-01: qty 1000

## 2024-08-01 MED ORDER — POLYETHYLENE GLYCOL 3350 17 G PO PACK: 17.0000 g | PACK | Freq: Every day | ORAL | Status: DC | PRN

## 2024-08-01 MED ORDER — CHLORHEXIDINE GLUCONATE 0.12 % MT SOLN
OROMUCOSAL | Status: AC
  Administered 2024-08-01: 15 mL via OROMUCOSAL
  Filled 2024-08-01: qty 15

## 2024-08-01 MED ORDER — CEFAZOLIN SODIUM-DEXTROSE 2-4 GM/100ML-% IV SOLN
INTRAVENOUS | Status: AC
  Filled 2024-08-01: qty 100

## 2024-08-01 MED ORDER — ROCURONIUM BROMIDE 10 MG/ML (PF) SYRINGE
PREFILLED_SYRINGE | INTRAVENOUS | Status: DC | PRN
  Administered 2024-08-01: 5 mg via INTRAVENOUS
  Administered 2024-08-01: 30 mg via INTRAVENOUS

## 2024-08-01 MED ORDER — FENTANYL CITRATE (PF) 100 MCG/2ML IJ SOLN
INTRAMUSCULAR | Status: DC | PRN
  Administered 2024-08-01: 50 ug via INTRAVENOUS

## 2024-08-01 MED ORDER — PANTOPRAZOLE SODIUM 40 MG IV SOLR
40.0000 mg | Freq: Every day | INTRAVENOUS | Status: DC
  Administered 2024-08-01 – 2024-08-04 (×4): 40 mg via INTRAVENOUS
  Filled 2024-08-01 (×4): qty 10

## 2024-08-01 MED ORDER — DOCUSATE SODIUM 100 MG PO CAPS
100.0000 mg | ORAL_CAPSULE | Freq: Two times a day (BID) | ORAL | Status: DC | PRN
  Administered 2024-08-17 – 2024-09-15 (×3): 100 mg via ORAL
  Filled 2024-08-01: qty 1

## 2024-08-01 MED ORDER — SODIUM CHLORIDE 0.9 % IV SOLN: INTRAVENOUS | Status: DC

## 2024-08-01 MED ORDER — ROCURONIUM BROMIDE 10 MG/ML (PF) SYRINGE
PREFILLED_SYRINGE | INTRAVENOUS | Status: AC
  Filled 2024-08-01: qty 10

## 2024-08-01 MED ORDER — ORAL CARE MOUTH RINSE: 15.0000 mL | Freq: Once | OROMUCOSAL | Status: AC

## 2024-08-01 MED ORDER — DEXAMETHASONE SOD PHOSPHATE PF 10 MG/ML IJ SOLN
INTRAMUSCULAR | Status: DC | PRN
  Administered 2024-08-01: 10 mg via INTRAVENOUS

## 2024-08-01 MED ORDER — SUGAMMADEX SODIUM 200 MG/2ML IV SOLN
INTRAVENOUS | Status: DC | PRN
  Administered 2024-08-01: 200 mg via INTRAVENOUS

## 2024-08-01 MED ORDER — CEFAZOLIN SODIUM-DEXTROSE 2-4 GM/100ML-% IV SOLN
2.0000 g | Freq: Three times a day (TID) | INTRAVENOUS | Status: AC
  Administered 2024-08-01 – 2024-08-02 (×3): 2 g via INTRAVENOUS
  Filled 2024-08-01 (×3): qty 100

## 2024-08-01 MED ORDER — ORAL CARE MOUTH RINSE
15.0000 mL | OROMUCOSAL | Status: AC | PRN
Start: 2024-08-01 — End: ?

## 2024-08-01 MED ORDER — SUCCINYLCHOLINE CHLORIDE 200 MG/10ML IV SOSY: PREFILLED_SYRINGE | INTRAVENOUS | Status: DC | PRN

## 2024-08-01 MED ORDER — SUCCINYLCHOLINE CHLORIDE 200 MG/10ML IV SOSY
PREFILLED_SYRINGE | INTRAVENOUS | Status: DC | PRN
  Administered 2024-08-01: 160 mg via INTRAVENOUS

## 2024-08-01 MED ORDER — BACITRACIN ZINC 500 UNIT/GM EX OINT
TOPICAL_OINTMENT | CUTANEOUS | Status: AC
  Filled 2024-08-01: qty 28.35

## 2024-08-01 MED ORDER — LABETALOL HCL 5 MG/ML IV SOLN
20.0000 mg | Freq: Once | INTRAVENOUS | Status: AC
  Administered 2024-08-01: 20 mg via INTRAVENOUS

## 2024-08-01 MED ORDER — PHENYLEPHRINE 80 MCG/ML (10ML) SYRINGE FOR IV PUSH (FOR BLOOD PRESSURE SUPPORT)
PREFILLED_SYRINGE | INTRAVENOUS | Status: DC | PRN
  Administered 2024-08-01: 200 ug via INTRAVENOUS
  Administered 2024-08-01 (×4): 160 ug via INTRAVENOUS
  Administered 2024-08-01: 120 ug via INTRAVENOUS
  Administered 2024-08-01: 160 ug via INTRAVENOUS

## 2024-08-01 SURGICAL SUPPLY — 1 items: NDL HYPO 22X1.5 SAFETY MO (MISCELLANEOUS) ×1 IMPLANT

## 2024-08-01 NOTE — Progress Notes (Incomplete)
 Attending note: I have seen and examined the patient. History, labs and imaging reviewed.  Blood pressure (!) 127/98, pulse 78, temperature 98.1 F (36.7 C), temperature source Axillary, resp. rate 20, height 5' 10 (1.778 m), weight 64.6 kg, SpO2 93%. Gen:      No acute distress HEENT:  EOMI, sclera anicteric Neck:     No masses; no thyromegaly Lungs:    Clear to auscultation bilaterally; normal respiratory effort*** CV:         Regular rate and rhythm; no murmurs Abd:      + bowel sounds; soft, non-tender; no palpable masses, no distension Ext:    No edema; adequate peripheral perfusion Neuro: alert and oriented x 3 Psych: normal mood and affect   Labs/Imaging personally reviewed, significant for WBC increased to 14.3   Assessment/plan:  Follow CXR and UA, procal  To OR today   The patient is critically ill with multiple organ systems failure and requires high complexity decision making for assessment and support, frequent evaluation and titration of therapies, application of advanced monitoring technologies and extensive interpretation of multiple databases.  Critical care time - 35 mins. This represents my time independent of the NPs time taking care of the pt.  Kiri Hinderliter MD [facility] 08/01/2024, 9:11 AM

## 2024-08-01 NOTE — Progress Notes (Signed)
 Dr Leopoldo notified of BP 171/119, Labetolol given as ordered.

## 2024-08-01 NOTE — Progress Notes (Signed)
 Spoke with Therisa, RN for the pt. She takes she was told by the PA to give all of the morning medications, and to stop the tube feeding. Included in the morning medications was Prosource. Dr. Leopoldo from anesthesia made aware, and he says the surgery will still proceed due to the diagnosis, btu he would like the surgeon to be made aware. Therisa, RN made aware pt reach out to the surgeon with the update.

## 2024-08-01 NOTE — TOC Progression Note (Signed)
 Transition of Care [facility]) - Progression Note    Patient Details  Name: SHARONE ALMOND MRN: [id number] Date of Birth: 03-19-1966  Transition of Care [facility]) CM/SW Contact  Inocente GORMAN Kindle, LCSW Phone Number: 08/01/2024, 9:38 AM  Clinical Narrative:    Inpatient care management continuing to follow for needs and medical progression.                      Expected Discharge Plan and Services                                               Social Drivers of Health (SDOH) Interventions SDOH Screenings   Food Insecurity: Patient Unable To Answer (07/16/2024)  Housing: Unknown (07/16/2024)  Transportation Needs: Patient Unable To Answer (07/16/2024)  Utilities: Patient Unable To Answer (07/16/2024)  Alcohol Screen: Low Risk  (08/30/2022)  Depression (PHQ2-9): Medium Risk (08/30/2022)  Tobacco Use: Medium Risk (07/29/2024)    Readmission Risk Interventions    07/16/2024   12:23 PM  Readmission Risk Prevention Plan  Transportation Screening Complete  Medication Review (RN Care Manager) Complete  PCP or Specialist appointment within 3-5 days of discharge Complete  HRI or Home Care Consult Complete  SW Recovery Care/Counseling Consult Complete  Palliative Care Screening Not Applicable  Skilled Nursing Facility Not Applicable

## 2024-08-01 NOTE — H&P (Addendum)
 58yo gentleman with a recurrent postoperative subdural hematoma  Past Medical History:  Diagnosis Date   Anxiety    Bipolar 1 disorder (HCC)    Depression    Hepatitis    Hypertension    Past Surgical History:  Procedure Laterality Date   CRANIOPLASTY, WITH BONE FLAP REPLACEMENT Left 07/29/2024   Procedure: CRANIOPLASTY, WITH BONE FLAP REPLACEMENT;  Surgeon: Rosslyn Dino HERO, MD;  Location: [facility] OR;  Service: Neurosurgery;  Laterality: Left;  LEFT CRANIOPLASTY WITH ARTIFICAL BONE FLAP REPLACEMENT   CRANIOTOMY Left 06/30/2024   Procedure: CRANIOTOMY HEMATOMA EVACUATION SUBDURAL;  Surgeon: Melonie Grass, MD;  Location: [facility] ORS;  Service: Neurosurgery;  Laterality: Left;   Social History   Socioeconomic History   Marital status: Widowed    Spouse name: Not on file   Number of children: Not on file   Years of education: Not on file   Highest education level: Not on file  Occupational History   Not on file  Tobacco Use   Smoking status: Former   Smokeless tobacco: Never  Vaping Use   Vaping status: Every Day   Substances: Nicotine, Flavoring  Substance and Sexual Activity   Alcohol use: Not Currently    Comment: Denies currently using   Drug use: Not Currently    Comment: Denies current use   Sexual activity: Never  Other Topics Concern   Not on file  Social History Narrative   Not on file   Social Drivers of Health   Financial Resource Strain: Not on file  Food Insecurity: Patient Unable To Answer (07/16/2024)   Hunger Vital Sign    Worried About Running Out of Food in the Last Year: Patient unable to answer    Ran Out of Food in the Last Year: Patient unable to answer  Transportation Needs: Patient Unable To Answer (07/16/2024)   PRAPARE - Transportation    Lack of Transportation (Medical): Patient unable to answer    Lack of Transportation (Non-Medical): Patient unable to answer  Physical Activity: Not on file  Stress: Not on file  Social Connections:  Not on file  Intimate Partner Violence: Patient Unable To Answer (07/16/2024)   Humiliation, Afraid, Rape, and Kick questionnaire    Fear of Current or Ex-Partner: Patient unable to answer    Emotionally Abused: Patient unable to answer    Physically Abused: Patient unable to answer    Sexually Abused: Patient unable to answer   History reviewed. No pertinent family history. No Known Allergies Scheduled Meds:  [MAR Hold] amLODipine   10 mg Per Tube Daily   [MAR Hold] Chlorhexidine Gluconate Cloth  6 each Topical Daily   [MAR Hold] docusate  100 mg Per Tube Daily   [MAR Hold] feeding supplement (PROSource TF20)  60 mL Per Tube Daily   [MAR Hold] folic acid   1 mg Per Tube Daily   [MAR Hold] multivitamin with minerals  1 tablet Per Tube Daily   [MAR Hold] mupirocin  ointment   Nasal BID   [MAR Hold] nicotine  14 mg Transdermal Daily   [MAR Hold] mouth rinse  15 mL Mouth Rinse 4 times per day   [MAR Hold] senna  1 tablet Per Tube Daily   [MAR Hold] sodium chloride  flush  10-40 mL Intracatheter Q12H   [MAR Hold] thiamine   100 mg Per Tube Daily   Continuous Infusions:  sodium chloride  10 mL/hr at 08/01/24 1241   feeding supplement (OSMOLITE 1.5 CAL) Stopped (08/01/24 0857)   PRN Meds:.[MAR Hold] acetaminophen  **OR** [  PENDING] acetaminophen  **OR** [MAR Hold] acetaminophen  **OR** [PENDING] acetaminophen , [MAR Hold] butalbital-acetaminophen -caffeine, [MAR Hold] labetalol , [MAR Hold] ondansetron  **OR** [MAR Hold] ondansetron  (ZOFRAN ) IV, [MAR Hold] mouth rinse, [MAR Hold] promethazine, [MAR Hold] sodium chloride  flush Vitals:   08/01/24 1330 08/01/24 1335  BP: (!) 166/115 (!) 150/115  Pulse: 84 83  Resp: (!) 29 13  Temp:    SpO2: 95% 94%   Physical Exam Constitutional:      Comments: Somnolent  HENT:     Head: Normocephalic.     Nose: Nose normal.  Eyes:     Pupils: Pupils are equal, round, and reactive to light.  Cardiovascular:     Rate and Rhythm: Normal rate.  Pulmonary:      Effort: Pulmonary effort is normal.  Abdominal:     General: Abdomen is flat.  Musculoskeletal:     Cervical back: Normal range of motion.   ASSESSMENT: LEFT SUBDURAL HEMATOMA  PLAN: LEFT EVACUATION SUBDURAL HEMATOMA This is an emergent procedure and pt does not have a guardian. I called Larnell Clarice Raddle (his friend) at [phone] but got his voicemail

## 2024-08-01 NOTE — Anesthesia Preprocedure Evaluation (Signed)
 Anesthesia Evaluation  Patient identified by MRN, date of birth, ID band  Reviewed: Allergy & Precautions, Patient's Chart, lab work & pertinent test results  Airway Mallampati: Unable to assess  TM Distance: >3 FB Neck ROM: Full    Dental  (+) Poor Dentition, Missing, Chipped   Pulmonary Patient abstained from smoking., former smoker   breath sounds clear to auscultation       Cardiovascular hypertension, Pt. on medications  Rhythm:Regular Rate:Normal     Neuro/Psych  PSYCHIATRIC DISORDERS Anxiety Depression Bipolar Disorder      GI/Hepatic negative GI ROS,,,(+) Hepatitis -  Endo/Other  negative endocrine ROS    Renal/GU negative Renal ROS     Musculoskeletal negative musculoskeletal ROS (+)    Abdominal   Peds  Hematology negative hematology ROS (+)   Anesthesia Other Findings   Reproductive/Obstetrics                              Anesthesia Physical Anesthesia Plan  ASA: 3  Anesthesia Plan: General   Post-op Pain Management: Ofirmev  IV (intra-op)*   Induction: Intravenous  PONV Risk Score and Plan: 3 and Ondansetron , Dexamethasone and Midazolam  Airway Management Planned: Oral ETT  Additional Equipment: Arterial line  Intra-op Plan:   Post-operative Plan: Extubation in OR  Informed Consent: I have reviewed the patients History and Physical, chart, labs and discussed the procedure including the risks, benefits and alternatives for the proposed anesthesia with the patient or authorized representative who has indicated his/her understanding and acceptance.     Dental advisory given  Plan Discussed with: CRNA  Anesthesia Plan Comments:          Anesthesia Quick Evaluation

## 2024-08-01 NOTE — Transfer of Care (Signed)
 Immediate Anesthesia Transfer of Care Note  Patient: Ernest Romero  Procedure(s) Performed: CREATION, CRANIAL BURR HOLE WITH EVACUATION OF HEMATOMA  Patient Location: PACU  Anesthesia Type:General  Level of Consciousness: awake  Airway & Oxygen Therapy: Patient Spontanous Breathing  Post-op Assessment: Report given to RN and Post -op Vital signs reviewed and stable  Post vital signs: Reviewed and stable  Last Vitals:  Vitals Value Taken Time  BP 143/104 08/01/24 16:09  Temp 37.6 C 08/01/24 16:09  Pulse 86 08/01/24 16:15  Resp 24 08/01/24 16:15  SpO2 91 % 08/01/24 16:15  Vitals shown include unfiled device data.  Last Pain:  Vitals:   08/01/24 1609  TempSrc:   PainSc: Asleep      Patients Stated Pain Goal: 3 (07/19/24 1209)  Complications: No notable events documented.

## 2024-08-01 NOTE — Progress Notes (Signed)
 SLP Cancellation Note  Patient Details Name: Ernest Romero MRN: [id number] DOB: [birth year]   Cancelled treatment:       Reason Eval/Treat Not Completed: Patient at procedure or test/unavailable (going to OR this PM for evacuation). SLP will f/u as able for ongoing dysphagia interventions.    Damien Blumenthal, M.A., CCC-SLP Speech Language Pathology, Acute Rehabilitation Services  Secure Chat preferred [phone]  08/01/2024, 10:43 AM

## 2024-08-01 NOTE — Op Note (Signed)
 DATE OF SURGERY:  08/01/2024   ATTENDING SURGEON:  Dino Sable, MD    PREOPERATIVE DIAGNOSIS: Subdural hematoma.   POSTOPERATIVE DIAGNOSIS: Subdural hematoma.   PROCEDURE PERFORMED: LEFT frontal burr hole for evacuation of subdural hematoma.    ANESTHESIA: General endotracheal anesthesia.   ESTIMATED BLOOD LOSS: See anesthesia chart.    URINE OUTPUT: See anesthesia chart.    CRYSTALLOIDS: See anesthesia chart.    COMPLICATIONS: None.    SPECIMEN: None.    DRAINS: One subdural drain.   PREOPERATIVE COURSE: The patient is an 58 year old gentleman with a large subdural hematoma after a cranioplasty. Options could not be discussed with the patient due to his condition and no family was available.    DESCRIPTION OF PROCEDURE: The patient was brought to the operating room and after general endotracheal anesthesia was ensued, the patient was placed in supine position with the head turned to the right and secured in a doughnut. Hair was removed using clippers after which a left frontal incision was outlined and he was prepped and draped in usual sterile fashion. This was then injected with lidocaine  with epinephrine  and using a #10 blade, an incision was made and retractor was placed. Subgaleal fluid egressed under pressure. A burr hole was made in the artificial boneflap and underlying fluid evacuated. This was then irrigated out with lukewarm saline copiously. After that a dark red tinged serous fluid was coming out of subdural space, a subdural drain was left in situ, exited through a separate stab wound and connected to a closed drainage system. The burr hole cover was then covered with Gelfoam after which a subgaleal drain was also inserted and the galea and skin were closed in separate layers after hemostasis had been performed. The drains were then secured to the skin and connected to a bag that was left in a dependent fashion on the floor.   At the end of the procedure, all  counts were complete.

## 2024-08-01 NOTE — Progress Notes (Signed)
 PCCM note  Patient examined after returning from OR More awake.  Able to state name, place and year SBP goal less than 160.  Cleviprex given Labetelol PRN   Lonna Coder MD [facility] See Amion for pager  If no response to pager , please call [phone] until 7pm After 7:00 pm call Elink  [phone] 08/01/2024, 5:17 PM

## 2024-08-01 NOTE — Progress Notes (Signed)
 NAME:  Ernest Romero, MRN:  [id number], DOB:  12-Dec-1965, LOS: 17 ADMISSION DATE:  07/15/2024, CONSULTATION DATE: 07/29/2024 REFERRING MD:  JONELLE Sable MD, CHIEF COMPLAINT:   Postcraniotomy and bone flap  History of Present Illness:  58 year old male with past medical history of alcohol abuse, bipolar disorder, panic disorder who presented to [facility] jail in September for large subdural hematoma s/p left frontal craniotomy and evacuation of bleed. He was transferred from [facility] to [facility] for flap replacement. While at [facility], patient was on alcohol withdrawal taper of phenobarbital which was discontinued prior to transfer.   Pertinent  Medical History    has a past medical history of Anxiety, Bipolar 1 disorder (HCC), Depression, Hepatitis, and Hypertension.   Significant Hospital Events: Including procedures, antibiotic start and stop dates in addition to other pertinent events   10/27 crani w/ flap replacement 10/29: off clevi, liberate SBP goal, changing psych meds 10/30: repeat CT head with increased collection - going to OR this afternoon for evacuation  Interim History / Subjective:  Somnolent overnight. Difficult to arouse this morning. He does eventually tell me his name and hospital. Does not open eyes. Moves all extremities. Going for STAT CT Head this morning.   Objective    Blood pressure (!) 157/117, pulse 80, temperature 99.3 F (37.4 C), temperature source Axillary, resp. rate (!) 21, height 5' 10 (1.778 m), weight 64.6 kg, SpO2 94%.    FiO2 (%):  [21 %] 21 %   Intake/Output Summary (Last 24 hours) at 08/01/2024 0704 Last data filed at 08/01/2024 0600 Gross per 24 hour  Intake 625.82 ml  Output 825 ml  Net -199.18 ml   Filed Weights   07/16/24 0800 08/01/24 0500  Weight: 67.5 kg 64.6 kg    Examination: General: middle aged male, acute on chronically ill appearing, no acute distress  HEENT: post operative incision stapled, no drainage  Neuro: somnolent and  difficult to arouse. After multiple commands he tells me name, hospital. Does not open eyes. Multiple commands but does lift both arms and wiggle toes. No facial droop. Perrla.  CV: s1s2, RRR, no m/r/g PULM: resp even and unlabored; shallow breaths; diminished bases, clear bilaterally  GI: soft, bsx4 active  Extremities: warm/dry, no edema  Resolved problem list   Assessment and Plan  Subdural hematoma s/p left cranioplasty 10/27 Altered mental status  Status post frontoparietal craniotomy on 06/30/2024 Underwent left-sided allograft cranioplasty, flap 10/27 10/30 AM CT head with increased fluid collection up to 23mm from 17 - nsgy following, appreciate management  - to OR this afternoon for evacuation - made NPO  - SBP goal liberated 10/29, clevi was stopped and aline out  - with decreased mentation 10/29, obtained ammonia given alcohol history which was mildly elevated, received 2 doses lactulose  - amlodipine  10mg  daily, prn labetalol   - neuro checks - limit sedating meds - see problem below for management  - Continue neuroprotective measures- normothermia, euglycemia, HOB greater than 30, head in neutral alignment, normocapnia, normoxia.  - pt/ot/slp  Leukocytosis  WBC 11.8>11.2>12.8>14.3. recent surgical procedure but continues to uptrend. Low grade temperatures at 99. Has been some question of aspirating but no increasing oxygen requirement. Lungs are clear. No foley or central lines. No culprit meds.  - send off procal  - obtain CXR and sputum culture  - hold on antibiotics at this time   Bipolar disorder - psych following  - will herbalist, does not need si/hi sitter  - Dr.  Janjua concerned about sedating effect of lithium  and Latuda . He has requested d/c these medications.  - psychiatry dc lithium  10/29. Latuda  halved - Haldol  IV as needed - Lacks decision-making capacity as per psychiatry team  History of alcohol, tobacco abuse Was on a phenobarb taper at [facility].  No currently withdrawal symptoms - nicotine patch  - thiamine , folic acid , multivitamin  - monitor for withdrawal symptoms, may need reinstatement of CIWA vs lower dose Lithium  and Latuda  as above - issues with sedation/somnolence and clouding neuro exam with SDH. Need to balance   HTN - amlodipine  10mg  daily  - prn labetalol    Critical care time: 40  Tinnie FORBES Furth, PA-C [facility] 08/01/24 7:04 AM  Please see [email or web address] for pager details.  From 7A-7P if no response, please call [phone] After hours, please call ELink [phone]

## 2024-08-01 NOTE — Anesthesia Procedure Notes (Signed)
 Procedure Name: Intubation Date/Time: 08/01/2024 2:59 PM  Performed by: Roslynn Waddell LABOR, CRNAPre-anesthesia Checklist: Patient identified, Emergency Drugs available, Suction available and Patient being monitored Patient Re-evaluated:Patient Re-evaluated prior to induction Oxygen Delivery Method: Circle System Utilized Preoxygenation: Pre-oxygenation with 100% oxygen Induction Type: IV induction Ventilation: Mask ventilation without difficulty Laryngoscope Size: Glidescope and 3 Grade View: Grade I Tube type: Oral Tube size: 7.5 mm Number of attempts: 1 Airway Equipment and Method: Stylet and Oral airway Placement Confirmation: ETT inserted through vocal cords under direct vision, positive ETCO2 and breath sounds checked- equal and bilateral Secured at: 23 cm Tube secured with: Tape Dental Injury: Teeth and Oropharynx as per pre-operative assessment  Comments: RSI. GS used for intubation d/t concern for full stomach. Cricoid pressure given during induction/intubation. Patient with baseline very poor dentition and missing many. Dentition and oral mucosa as per preop.
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
## 2024-08-01 NOTE — Progress Notes (Addendum)
 Pt taken for STAT CT @ 0745. Pt made NPO for upcoming procedure per Tinnie Furth PA. Asked about 1000 meds and was told to proceed with morning meds and then make NPO. Tube feedings paused at 0857 per provider request.    At 1100, called Dr. Rosslyn & made him aware that Prosource was given at 1011 via tube.   Updated Yancy Albino RN with OR.     Therisa JINNY Sick 08/01/2024 12:00 PM

## 2024-08-02 ENCOUNTER — Encounter (HOSPITAL_COMMUNITY): Payer: Self-pay | Admitting: Neurosurgery

## 2024-08-02 ENCOUNTER — Inpatient Hospital Stay (HOSPITAL_COMMUNITY): Payer: MEDICAID

## 2024-08-02 DIAGNOSIS — I6200 Nontraumatic subdural hemorrhage, unspecified: Secondary | ICD-10-CM

## 2024-08-02 DIAGNOSIS — R253 Fasciculation: Secondary | ICD-10-CM

## 2024-08-02 LAB — LITHIUM LEVEL: Lithium Lvl: <0.1 mmol/L — ABNORMAL LOW (ref 0.60–1.20)

## 2024-08-02 LAB — GLUCOSE, CAPILLARY
Glucose-Capillary: 106 mg/dL — ABNORMAL HIGH (ref 70–99)
Glucose-Capillary: 101 mg/dL — ABNORMAL HIGH (ref 70–99)
Glucose-Capillary: 117 mg/dL — ABNORMAL HIGH (ref 70–99)
Glucose-Capillary: 127 mg/dL — ABNORMAL HIGH (ref 70–99)
Glucose-Capillary: 117 mg/dL — ABNORMAL HIGH (ref 70–99)
Glucose-Capillary: 118 mg/dL — ABNORMAL HIGH (ref 70–99)

## 2024-08-02 LAB — CBC
HCT: 42.5 % (ref 39.0–52.0)
Hemoglobin: 13.8 g/dL (ref 13.0–17.0)
MCH: 29.5 pg (ref 26.0–34.0)
MCHC: 32.5 g/dL (ref 30.0–36.0)
MCV: 90.8 fL (ref 80.0–100.0)
Platelets: 283 K/uL (ref 150–400)
RBC: 4.68 MIL/uL (ref 4.22–5.81)
RDW: 13.2 % (ref 11.5–15.5)
WBC: 11.2 K/uL — ABNORMAL HIGH (ref 4.0–10.5)
nRBC: 0 % (ref 0.0–0.2)

## 2024-08-02 LAB — BASIC METABOLIC PANEL WITH GFR
Anion gap: 14 (ref 5–15)
BUN: 23 mg/dL — ABNORMAL HIGH (ref 6–20)
CO2: 19 mmol/L — ABNORMAL LOW (ref 22–32)
Calcium: 9.4 mg/dL (ref 8.9–10.3)
Chloride: 108 mmol/L (ref 98–111)
Creatinine, Ser: 1.02 mg/dL (ref 0.61–1.24)
GFR, Estimated: >60 mL/min (ref >60)
Glucose, Bld: 103 mg/dL — ABNORMAL HIGH (ref 70–99)
Potassium: 4.3 mmol/L (ref 3.5–5.1)
Sodium: 141 mmol/L (ref 135–145)

## 2024-08-02 LAB — AMMONIA: Ammonia: 28 umol/L (ref 9–35)

## 2024-08-02 LAB — MAGNESIUM: Magnesium: 2.3 mg/dL (ref 1.7–2.4)

## 2024-08-02 LAB — PHOSPHORUS: Phosphorus: 2.9 mg/dL (ref 2.5–4.6)

## 2024-08-02 MED ORDER — CLONAZEPAM 0.5 MG PO TABS
0.5000 mg | ORAL_TABLET | Freq: Two times a day (BID) | ORAL | Status: DC
  Administered 2024-08-02 – 2024-08-08 (×12): 0.5 mg via ORAL
  Filled 2024-08-02 (×13): qty 1

## 2024-08-02 MED ORDER — HEPARIN SODIUM (PORCINE) 5000 UNIT/ML IJ SOLN
5000.0000 [IU] | Freq: Two times a day (BID) | INTRAMUSCULAR | Status: DC
  Administered 2024-08-03 – 2024-08-11 (×18): 5000 [IU] via SUBCUTANEOUS
  Filled 2024-08-02 (×18): qty 1

## 2024-08-02 NOTE — Consult Note (Addendum)
 NEUROLOGY CONSULT NOTE   Date of service: August 02, 2024 Patient Name: Ernest Romero MRN:  [id number] DOB:  1965-11-07 Chief Complaint: Concern for seizures/twitching Requesting Provider: Dino Sable, MD  History of Present Illness  Ernest Romero is a 58 y.o. male with hx of alcoholism, bipolar disorder, panic disorder presented to [facility] ED on 928 for unresponsiveness.  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  Taken for emergent craniotomy with evacuation of the hematoma and placement of drain which was eventually removed.  He was watched on alcohol withdrawal protocol with phenobarbital which was then tapered off.  He was transferred to [facility] for flap versus placement of his craniotomy.  He was noted to have some head twitching and bilateral arm twitching movements for which neurology was consulted. Of note, his psychiatric medications were held due to concern for somnolence.   ROS  Comprehensive ROS performed and pertinent positives documented in HPI   Past History   Past Medical History:  Diagnosis Date   Anxiety    Bipolar 1 disorder (HCC)    Depression    Hepatitis    Hypertension     Past Surgical History:  Procedure Laterality Date   BURR HOLE N/A 08/01/2024   Procedure: CREATION, CRANIAL BURR HOLE WITH EVACUATION OF HEMATOMA;  Surgeon: Sable Dino HERO, MD;  Location: [facility] OR;  Service: Neurosurgery;  Laterality: N/A;   CRANIOPLASTY, WITH BONE FLAP REPLACEMENT Left 07/29/2024   Procedure: CRANIOPLASTY, WITH BONE FLAP REPLACEMENT;  Surgeon: Sable Dino HERO, MD;  Location: [facility] OR;  Service: Neurosurgery;  Laterality: Left;  LEFT CRANIOPLASTY WITH ARTIFICAL BONE FLAP REPLACEMENT   CRANIOTOMY Left 06/30/2024   Procedure: CRANIOTOMY HEMATOMA EVACUATION SUBDURAL;  Surgeon: Melonie Grass, MD;  Location: [facility] ORS;  Service: Neurosurgery;  Laterality: Left;    Family History: History reviewed. No pertinent  family history.  Social History  reports that he has quit smoking. He has never used smokeless tobacco. He reports that he does not currently use alcohol. He reports that he does not currently use drugs.  No Known Allergies  Medications   Current Facility-Administered Medications:    acetaminophen  (TYLENOL ) tablet 650 mg, 650 mg, Per Tube, Q4H PRN, 650 mg at 08/02/24 1612 **OR** [PENDING] acetaminophen  (TYLENOL ) tablet 650 mg, 650 mg, Per Tube, Q4H PRN **OR** acetaminophen  (TYLENOL ) suppository 650 mg, 650 mg, Rectal, Q4H PRN **OR** [PENDING] acetaminophen  (TYLENOL ) suppository 650 mg, 650 mg, Rectal, Q4H PRN,    amLODipine  (NORVASC ) tablet 10 mg, 10 mg, Per Tube, Daily, Mannam, Praveen, MD, 10 mg at 08/02/24 1035   butalbital-acetaminophen -caffeine (FIORICET) 50-325-40 MG per tablet 2 tablet, 2 tablet, Per Tube, Q4H PRN, Mannam, Praveen, MD   Chlorhexidine Gluconate Cloth 2 % PADS 6 each, 6 each, Topical, Daily, Franky Redia SAILOR, MD, 6 each at 08/02/24 1036   clonazePAM  (KLONOPIN ) tablet 0.5 mg, 0.5 mg, Oral, BID, Autry, Lauren E, PA-C   docusate (COLACE) 50 MG/5ML liquid 100 mg, 100 mg, Per Tube, Daily, Mannam, Praveen, MD, 100 mg at 08/02/24 1035   docusate sodium (COLACE) capsule 100 mg, 100 mg, Oral, BID PRN, Adolph, Lauren E, PA-C   feeding supplement (OSMOLITE 1.5 CAL) liquid 1,000 mL, 1,000 mL, Per Tube, Continuous, Mannam, Praveen, MD, Paused at 08/01/24 0857   feeding supplement (PROSource TF20) liquid 60 mL, 60 mL, Per Tube, Daily, Mannam, Praveen, MD, 60 mL at 08/01/24 1011   folic acid  (FOLVITE ) tablet 1 mg, 1 mg, Per Tube,  Daily, Mannam, Praveen, MD, 1 mg at 08/02/24 1035   [START ON 08/03/2024] heparin injection 5,000 Units, 5,000 Units, Subcutaneous, Q12H, Autry, Lauren E, PA-C   labetalol  (NORMODYNE ) injection 10-40 mg, 10-40 mg, Intravenous, Q10 min PRN, Mannam, Praveen, MD, 10 mg at 08/02/24 1703   multivitamin with minerals tablet 1 tablet, 1 tablet, Per Tube, Daily,  Mannam, Praveen, MD, 1 tablet at 08/02/24 1036   mupirocin  ointment (BACTROBAN ) 2 %, , Nasal, BID, Denninger, Jade M, RPH, Given at 08/02/24 1036   nicotine (NICODERM CQ - dosed in mg/24 hours) patch 14 mg, 14 mg, Transdermal, Daily, Fredia Dorothe HERO, MD, 14 mg at 08/02/24 1036   ondansetron  (ZOFRAN ) tablet 4 mg, 4 mg, Oral, Q4H PRN **OR** ondansetron  (ZOFRAN ) injection 4 mg, 4 mg, Intravenous, Q4H PRN, Janjua, Rashid M, MD, 4 mg at 08/08/24 1506   Oral care mouth rinse, 15 mL, Mouth Rinse, PRN, Mannam, Praveen, MD   pantoprazole (PROTONIX) injection 40 mg, 40 mg, Intravenous, QHS, Janjua, Rashid M, MD, 40 mg at 08-08-24 2101   polyethylene glycol (MIRALAX / GLYCOLAX) packet 17 g, 17 g, Oral, Daily PRN, Adolph, Lauren E, PA-C   promethazine (PHENERGAN) tablet 12.5-25 mg, 12.5-25 mg, Oral, Q4H PRN, Janjua, Rashid M, MD   senna (SENOKOT) tablet 8.6 mg, 1 tablet, Per Tube, Daily, Mannam, Praveen, MD, 8.6 mg at 08/02/24 1035   sodium chloride  flush (NS) 0.9 % injection 10-40 mL, 10-40 mL, Intracatheter, Q12H, Franky Redia SAILOR, MD, 20 mL at 08/02/24 1036   sodium chloride  flush (NS) 0.9 % injection 10-40 mL, 10-40 mL, Intracatheter, PRN, Franky Redia SAILOR, MD, 30 mL at 07/29/24 1135   thiamine  (VITAMIN B1) tablet 100 mg, 100 mg, Per Tube, Daily, Mannam, Praveen, MD, 100 mg at 08/02/24 1035  Vitals   Vitals:   08/02/24 1500 08/02/24 1600 08/02/24 1700 08/02/24 1710  BP: (!) 125/97 (!) 154/116 (!) 164/114 (!) 133/105  Pulse: 65 77 68 66  Resp: 16 (!) 24 (!) 24 20  Temp:  97.7 F (36.5 C)    TempSrc:  Axillary    SpO2: 94% 90% 96% 95%  Weight:      Height:        Body mass index is 20.43 kg/m.   Physical Exam   General Examination: Sleeping in bed, awakens to voice in no acute distress HEENT: Postop incision on the left with 2 JP drains. CVS: Regular rate rhythm Respiratory: Breathing well saturating normally on room air Neurological exam He is awake alert oriented x 3 Speech is  somewhat slow but not truly dysarthric No evidence of aphasia Diminished attention concentration Cranial nerve II to XII intact Motor examination with no drift Sensation intact light touch without extinction No coordination deficits noted  Labs/Imaging/Neurodiagnostic studies   CBC:  Recent Labs  Lab 2024-08-08 0238 08/02/24 0225  WBC 14.3* 11.2*  HGB 15.4 13.8  HCT 45.5 42.5  MCV 87.8 90.8  PLT 285 283   Basic Metabolic Panel:  Lab Results  Component Value Date   NA 141 08/02/2024   K 4.3 08/02/2024   CO2 19 (L) 08/02/2024   GLUCOSE 103 (H) 08/02/2024   BUN 23 (H) 08/02/2024   CREATININE 1.02 08/02/2024   CALCIUM 9.4 08/02/2024   GFRNONAA >60 08/02/2024   GFRAA >60 09/16/2018   Urine Drug Screen:     Component Value Date/Time   LABOPIA NONE DETECTED 06/30/2024 0905   LABOPIA NEGATIVE 06/16/2024 0924   COCAINSCRNUR POSITIVE (A) 06/30/2024 0905   LABBENZ  POSITIVE (A) 06/30/2024 0905   LABBENZ NEGATIVE 06/16/2024 0924   AMPHETMU NONE DETECTED 06/30/2024 0905   AMPHETMU NEGATIVE 06/16/2024 0924   THCU POSITIVE (A) 06/30/2024 0905   THCU POSITIVE (A) 06/16/2024 0924   LABBARB NONE DETECTED 06/30/2024 0905   LABBARB NEGATIVE 06/16/2024 0924    Alcohol Level     Component Value Date/Time   ETH <15 06/30/2024 0833   INR  Lab Results  Component Value Date   INR 1.0 06/30/2024   APTT  Lab Results  Component Value Date   APTT 30 06/30/2024   Lithium  level 1.07 on 07/29/2024  Imaging personally reviewed CT head done at 0400 hrs. today-interval decrease in the size of extra-axial fluid collection along the left cerebral convexity with 2 drainage catheters in place, small pneumocephalus and significantly less mass effect upon the left cerebral hemisphere.  The left lateral ventricle and cerebral sulci are no longer effaced.  Decreased rightward midline shift-now only 2 mm.  Status post left pterional cranioplasty.  Overlying soft tissue swelling  ASSESSMENT    Ernest Romero is a 58 y.o. male with above past medical history, who has been admitted for treatment of subdural hematoma s/p craniotomy and back for flap replacement, noted to have some neck twitching movements. He recently had a lot of his psychotropic medications held because of somnolence. Concern for twitching movements of the arms and some neck twitching-question seizure. I did not witness anything convincing for seizure but given his extensive history, including that of alcohol use and possibly withdrawal, benzodiazepine use and psychotropic use and subdural hematoma status post surgery, it is quite possible that he could be having seizures. I would refrain from loading him with an antiseizure medication until I am sure that this is seizures.  Will obtain EEG  Impression: Concern for seizure   RECOMMENDATIONS  LTM EEG Hold off on using antiseizure drugs for now Replete thiamine  and folate. CIWA protocol Can resume home benzo-lower dose. Appreciate psychiatry consultation-if somnolence is not a concern, can introduce other psychiatric medications back per psychiatry recommendations. We will continue to follow with you Plan was discussed with Tinnie Furth, PA-C from PCCM team and relayed to Dr. Janjua and Dr. Theophilus via secure chat  ______________________________________________________________________    Signed, Eligio Lav, MD [facility]

## 2024-08-02 NOTE — TOC Initial Note (Signed)
 Transition of Care [facility]) - Initial/Assessment Note    Patient Details  Name: Ernest Romero MRN: [id number] Date of Birth: 1966/07/12  Transition of Care [facility]) CM/SW Contact:    Ernest Naugle M, RN Phone Number: 08/02/2024, 2:56 PM  Clinical Narrative:                 58 year old male with past medical history of alcohol abuse, bipolar disorder, panic disorder who [facility] to [facility] from jail in September for large subdural hematoma s/p left frontal craniotomy and evacuation of bleed. He was transferred from [facility] to [facility] for flap replacement. While at [facility], patient was on alcohol withdrawal taper of phenobarbital which was discontinued prior to transfer. He returned to OR on 10/30 for SDH evacuation due to increased collection on CT.   Patient lacks decision making capacity per psychiatry.    Notified by Ernest Romero, Case Manager with [facility] (phone: [phone]); she states that an [facility] referral was filed within the last 30 days.  [facility] Case Manager given patient diagnosis and brief clinical update.  She states that she does not have consent to get patient medical records. She was given only contact for patient listed named Ernest Romero., a friend.  She is appreciative of assistance and states she will continue to follow.        Expected Discharge Plan: Skilled Nursing Facility Barriers to Discharge: Continued Medical Work up            Expected Discharge Plan and Services   Discharge Planning Services: CM Consult                                          Prior Living Arrangements/Services   Lives with:: Facility Resident (from jail)                   Activities of Daily Living   ADL Screening (condition at time of admission) Independently performs ADLs?: Yes (appropriate for developmental age) Is the patient deaf or have difficulty hearing?: No Does the patient have difficulty seeing, even when wearing glasses/contacts?: No Does the patient have difficulty  concentrating, remembering, or making decisions?: Yes      Orientation: : Oriented to Self, Oriented to Place      Admission diagnosis:  Traumatic subdural hemorrhage with loss of consciousness status unknown, initial encounter [S06.5XAA] Other symptoms and signs involving cognitive functions and awareness [R41.89] Other specified postprocedural states [Z98.890] Subdural hematoma (HCC) [S06.5XAA] SDH (subdural hematoma) (HCC) [S06.5XAA] Patient Active Problem List   Diagnosis Date Noted   Protein-calorie malnutrition, severe 07/31/2024   SDH (subdural hematoma) (HCC) 07/29/2024   Unable to make decisions about medical treatment due to impaired mental capacity 07/25/2024   Malnutrition of moderate degree 07/02/2024   Hypophosphatemia 07/02/2024   Subdural hematoma (HCC) 06/30/2024   Behavior concern in adult 04/21/2024   Drug-seeking behavior 03/02/2024   Polysubstance abuse (HCC) 03/02/2024   Delusions (HCC) 04/15/2023   Cocaine abuse with cocaine-induced psychotic disorder, with delusions (HCC) 04/15/2023   Overweight with body mass index (BMI) of 28 to 28.9 in adult 08/30/2022   Essential hypertension 09/17/2018   Bipolar disorder (HCC) 04/22/2013   PCP:  [name]:   [facility] [address] ([address]), [place] - [address] [place] ([place]) [place] [address] Phone: [phone] Fax: [phone]  [facility] [phone] -  [place], [place] - [address] [place] [address] Phone: [phone] Fax: [phone]  [facility] - [place], [place] - [address] [place] [address] Phone: [phone] Fax: [phone]  [facility] [address] [place] [address] Phone: [phone] Fax: [phone]  [id number] [place], [place] [address] AT [facility]  [address] [place] [address] Phone: [phone] Fax: [phone]  [id number] [place], [place] - [address] AT [facility] OF [facility] [address] [place] [address] Phone: [phone] Fax: [phone]  [place] [facility] [address] [place] [address] Phone: [phone] Fax: [phone]     Social Drivers of Health (SDOH) Social History: SDOH Screenings   Food Insecurity: Patient Unable To Answer (07/16/2024)  Housing: Unknown (07/16/2024)  Transportation Needs: Patient Unable To Answer (07/16/2024)  Utilities: Patient Unable To Answer (07/16/2024)  Alcohol Screen: Low Risk  (08/30/2022)  Depression (PHQ2-9): Medium Risk (08/30/2022)  Tobacco Use: Medium Risk (08/01/2024)   SDOH Interventions:     Readmission Risk Interventions    07/16/2024   12:23 PM  Readmission Risk Prevention Plan  Transportation Screening Complete  Medication Review (RN Care Manager) Complete  PCP or Specialist appointment within 3-5 days of discharge Complete  HRI or Home Care Consult Complete  SW Recovery Care/Counseling Consult Complete  Palliative Care Screening Not Applicable  Skilled Nursing Facility Not Applicable   Ernest MICAEL Fass, RN, BSN  Trauma/Neuro ICU Case Manager [phone]

## 2024-08-02 NOTE — Evaluation (Signed)
 Speech Language Pathology Evaluation Patient Details Name: Ernest Romero MRN: [id number] DOB: 1965-12-05 Today's Date: 08/02/2024 Time: 0950-1000 SLP Time Calculation (min) (ACUTE ONLY): 10 min  Problem List:  Patient Active Problem List   Diagnosis Date Noted   Protein-calorie malnutrition, severe 07/31/2024   SDH (subdural hematoma) (HCC) 07/29/2024   Unable to make decisions about medical treatment due to impaired mental capacity 07/25/2024   Malnutrition of moderate degree 07/02/2024   Hypophosphatemia 07/02/2024   Subdural hematoma (HCC) 06/30/2024   Behavior concern in adult 04/21/2024   Drug-seeking behavior 03/02/2024   Polysubstance abuse (HCC) 03/02/2024   Delusions (HCC) 04/15/2023   Cocaine abuse with cocaine-induced psychotic disorder, with delusions (HCC) 04/15/2023   Overweight with body mass index (BMI) of 28 to 28.9 in adult 08/30/2022   Essential hypertension 09/17/2018   Bipolar disorder (HCC) 04/22/2013   Past Medical History:  Past Medical History:  Diagnosis Date   Anxiety    Bipolar 1 disorder (HCC)    Depression    Hepatitis    Hypertension    Past Surgical History:  Past Surgical History:  Procedure Laterality Date   CRANIOPLASTY, WITH BONE FLAP REPLACEMENT Left 07/29/2024   Procedure: CRANIOPLASTY, WITH BONE FLAP REPLACEMENT;  Surgeon: Rosslyn Dino HERO, MD;  Location: [facility] OR;  Service: Neurosurgery;  Laterality: Left;  LEFT CRANIOPLASTY WITH ARTIFICAL BONE FLAP REPLACEMENT   CRANIOTOMY Left 06/30/2024   Procedure: CRANIOTOMY HEMATOMA EVACUATION SUBDURAL;  Surgeon: Melonie Grass, MD;  Location: [facility] ORS;  Service: Neurosurgery;  Laterality: Left;   HPI:  58 yo male with history of alcohol use disorder, psychiatric disorder, and HTN, who presents 10/14 from [facility], where he was admitted 9/28 after an unresponsive episode in jail. Found to have large SDH s/p L frontoparietal craniotomy with evacuation of SDH and drain placement 9/28 (removed  9/29). Followed by SLP for cognitive-linguistic goals and on a regular diet prior to transfer. Transferred to [facility] for further management of craniotomy and flab. S/p cranioplasty with bone flap replacement 10/27. Change in status thought to be related to Klonopin  administration 10/28, repeat CTH shows significant increase in size of mixed attenuation subdural fluid collection underlying the L tempoparietal craniotomy site, now measuring approximately 15 mm in thickness, with associated mild mass effect and slight rightward midline shift s/p L frontal burr hole for evacuation 10/30.   Assessment / Plan / Recommendation Clinical Impression  Pt's is alert and oriented x4, though the session was somewhat limited by emotional lability. He presents with deficits primarily related to problem solving and awareness with performance suggesting these skills are emerging. Expressive and receptive language are [facility]. Suspect his performance fluctuates significantly based on mentation and baseline psychiatric diagnoses, though appears overall functional when maximally alert. Will continue following for ongoing assessment.    SLP Assessment  SLP Recommendation/Assessment: Patient needs continued Speech Language Pathology Services SLP Visit Diagnosis: Cognitive communication deficit (R41.841) Attention and concentration deficit following: Nontraumatic intracerebral hemorrhage Frontal lobe and executive function deficit following: Nontraumatic intracerebral hemorrhage     Assistance Recommended at Discharge  Frequent or constant Supervision/Assistance  Functional Status Assessment Patient has had a recent decline in their functional status and/or demonstrates limited ability to make significant improvements in function in a reasonable and predictable amount of time  Frequency and Duration min 2x/week  2 weeks      SLP Evaluation Cognition  Overall Cognitive Status: No family/caregiver present to determine baseline  cognitive functioning Arousal/Alertness: Awake/alert Orientation Level: Oriented X4 Attention: Sustained Sustained Attention:  Appears intact Memory: Appears intact Awareness: Impaired Awareness Impairment: Intellectual impairment Problem Solving: Impaired Problem Solving Impairment: Verbal basic;Functional basic       Comprehension  Auditory Comprehension Overall Auditory Comprehension: Appears within functional limits for tasks assessed    Expression Expression Primary Mode of Expression: Verbal Verbal Expression Overall Verbal Expression: Appears within functional limits for tasks assessed   Oral / Motor  Oral Motor/Sensory Function Overall Oral Motor/Sensory Function: Within functional limits Motor Speech Overall Motor Speech: Appears within functional limits for tasks assessed            Damien Blumenthal, M.A., CCC-SLP Speech Language Pathology, Acute Rehabilitation Services  Secure Chat preferred [phone]  08/02/2024, 11:08 AM

## 2024-08-02 NOTE — Progress Notes (Signed)
 NAME:  Ernest Romero, MRN:  [id number], DOB:  05-01-1966, LOS: 18 ADMISSION DATE:  07/15/2024, CONSULTATION DATE: 07/29/2024 REFERRING MD:  JONELLE Sable MD, CHIEF COMPLAINT:   Postcraniotomy and bone flap  History of Present Illness:  58 year old male with past medical history of alcohol abuse, bipolar disorder, panic disorder who presented to [facility] from jail in September for large subdural hematoma s/p left frontal craniotomy and evacuation of bleed. He was transferred from [facility] to [facility] for flap replacement. While at [facility], patient was on alcohol withdrawal taper of phenobarbital which was discontinued prior to transfer.   Pertinent  Medical History    has a past medical history of Anxiety, Bipolar 1 disorder (HCC), Depression, Hepatitis, and Hypertension.   Significant Hospital Events: Including procedures, antibiotic start and stop dates in addition to other pertinent events   10/27 crani w/ flap replacement 10/29: off clevi, liberate SBP goal, changing psych meds 10/30: repeat CT head with increased collection - going to OR this afternoon for evacuation 10/31: OR yesterday for SDH evacuation   Interim History / Subjective:  OR yesterday for subdural evacuation. Repeat head CT stable with improved mass effect and MLS.   Objective    Blood pressure (!) 139/96, pulse 64, temperature 98.5 F (36.9 C), temperature source Axillary, resp. rate 14, height 5' 10 (1.778 m), weight 64.6 kg, SpO2 94%.        Intake/Output Summary (Last 24 hours) at 08/02/2024 0754 Last data filed at 08/02/2024 0500 Gross per 24 hour  Intake 1894.87 ml  Output 615 ml  Net 1279.87 ml   Filed Weights   07/16/24 0800 08/01/24 0500  Weight: 67.5 kg 64.6 kg    Examination: General: middle aged male, acute on chronically ill appearing, no acute distress  HEENT: post operative incision stapled, two JP drains with bloody drainage  Neuro: wakes to voice, oriented to self, place; moves all extremities against  gravity. Slightly drowsy. No facial droop.  CV: s1s2, RRR, no m/r/g PULM: resp even and unlabored; shallow breaths; diminished bases, clear bilaterally  GI: soft, bsx4 active  Extremities: warm/dry, no edema  Resolved problem list   Assessment and Plan  Subdural hematoma s/p left cranioplasty 10/27 Altered mental status  Status post frontoparietal craniotomy on 06/30/2024 Underwent left-sided allograft cranioplasty, flap 10/27 10/30 AM CT head with increased fluid collection up to 23mm from 17 10/31: OR for SDH evacuation  - nsgy following, appreciate management  - leave drains until 11/1 - SBP < 160 - amlodipine  10mg  daily, prn labetalol   - neuro checks - limit sedating meds - see problem below for management  - Continue neuroprotective measures- normothermia, euglycemia, HOB greater than 30, head in neutral alignment, normocapnia, normoxia.  - pt/ot/slp - repeat SLP this morning to clear for eating/drinking - get OOB today   Leukocytosis  WBC 11.8>11.2>12.8>14.3. recent surgical procedure but continues to uptrend. Low grade temperatures at 99. Has been some question of aspirating but no increasing oxygen requirement. Lungs are clear. No foley or central lines. No culprit meds. Procal negative. CXR okay. WBC coming down 10/31 - maybe post operative, reactive  - no antibiotics at this time   Bipolar disorder - psych following  - will dc safety sitter, does not need si/hi sitter  - Dr. Sable concerned about sedating effect of lithium  and Latuda . He has requested d/c these medications.  - psychiatry dc lithium  10/29. Latuda  halved - stopped 10/30.  - re-engaging 10/31 for help with medications that may not  be as sedating at request of Dr. Janjua  - Haldol  IV as needed - Lacks decision-making capacity as per psychiatry team  History of alcohol, tobacco abuse Was on a phenobarb taper at [facility]. No currently withdrawal symptoms - nicotine patch  - thiamine , folic acid , multivitamin   - monitor for withdrawal symptoms, may need reinstatement of CIWA vs lower dose Lithium  and Latuda  as above - issues with sedation/somnolence and clouding neuro exam with SDH. Need to balance   HTN - amlodipine  10mg  daily  - prn labetalol    Critical care time: 35  Tinnie FORBES Adolph DEVONNA [facility] 08/02/24 7:54 AM  Please see [email or web address] for pager details.  From 7A-7P if no response, please call [phone] After hours, please call ELink [phone]

## 2024-08-02 NOTE — Progress Notes (Signed)
 Attending note: I have seen and examined the patient. History, labs and imaging reviewed.  58 year old with alcohol abuse, bipolar disorder, right subdural hematoma s/p craniotomy.  Admitted for flap replacement.  Back to the OR on 10/30 for evacuation of subdural hematoma Mental status is improved today Asking for a soda to drink  Blood pressure (!) 128/92, pulse 68, temperature 98.8 F (37.1 C), temperature source Axillary, resp. rate 14, height 5' 10 (1.778 m), weight 64.6 kg, SpO2 93%. Awake, oriented, craniotomy scar clean and dry Heart rate is regular rate and rhythm Lungs are clear to auscultation  Labs/Imaging personally reviewed, significant for BUN/creatinine 23/1.02 WBC 11.2, hemoglobin 13.8, platelets 283  Assessment/plan: Subdural hematoma s/p craniotomy and cranioplasty with flap Continue monitoring   Bipolar disorder Appreciate psychiatry recommendation Discontinue lithium .  On decreased dose of Latuda .  Off Klonopin  and Haldol  due to somnolence Will work with psychiatry to manage medications and keep him less sedated Delirium precautions Thiamine , folic acid   Hoorain Kozakiewicz MD [facility] 08/02/2024, 9:50 AM

## 2024-08-02 NOTE — Progress Notes (Signed)
 [address] Tinnie Furth, PA made aware of new tick like motion Pt has. It is rhythmic but interruptable. Pt straightens arms and ticks upward with a leftward twinge of his head. Pt is able to complete a full neuro, no changes in mentation or status. Lauren at bedside to assess. Dr. Janjua made aware and Psych consulted. Video EEG was ordered for overnight study, Clonopin added back, lithium  level ordered and CT scheduled for 0400 on 11/1. Pt is aware and has no questions.

## 2024-08-02 NOTE — Progress Notes (Signed)
 Physical Therapy Treatment Patient Details Name: Ernest Romero MRN: [id number] DOB: 02-Oct-1966 Today's Date: 08/02/2024   History of Present Illness Pt is a 58 y.o. male presenting 10/14 from [facility] where he was admitted 9/28 for unresponsive episode in the jail. 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 On CIWA protocol. Transferred to [facility] for further management of craniotomy and flap. 10/27 s/p cranioplasty with bone flap replacement. 10/28 Change in status thought to be related to Klonopin  administration 10/28, repeat CTH shows significant increase in size of mixed attenuation subdural fluid collection underlying the L tempoparietal craniotomy site, now measuring approximately 15 mm in thickness, with associated mild mass effect and slight rightward midline shift. OR on 10/30 for SDH evacuation. PMH alcohol use disorder, psychiatric disorder, HTN    PT Comments  Pt tolerates treatment well after SDH evacuation and drain placement on 10/30. Pt demonstrates significant improvement compared to previous PT session, alert and participating well. Pt demonstrates a persistent posterior lean in sitting and standing but does respond to cues to correct. Pt will benefit from continued frequent mobilization in an effort to improve balance and to reduce risk for falls. Pt will benefit from inpatient PT services at the time of discharge.   If plan is discharge home, recommend the following: A lot of help with bathing/dressing/bathroom;Assistance with cooking/housework;Direct supervision/assist for medications management;Direct supervision/assist for financial management;Assist for transportation;Help with stairs or ramp for entrance;Supervision due to cognitive status;A little help with walking and/or transfers   Can travel by private vehicle     Yes  Equipment Recommendations  Wheelchair (measurements PT);Wheelchair cushion  (measurements PT)    Recommendations for Other Services       Precautions / Restrictions Precautions Precautions: Fall Recall of Precautions/Restrictions: Impaired Precaution/Restrictions Comments: BP <180, cortrak, JP drain x 2 from L skull Restrictions Weight Bearing Restrictions Per Provider Order: No     Mobility  Bed Mobility Overal bed mobility: Needs Assistance Bed Mobility: Supine to Sit     Supine to sit: Mod assist, HOB elevated          Transfers Overall transfer level: Needs assistance Equipment used: Rolling walker (2 wheels), None Transfers: Sit to/from Stand Sit to Stand: Min assist           General transfer comment: posterior lean    Ambulation/Gait Ambulation/Gait assistance: Min assist Gait Distance (Feet): 3 Feet Assistive device: Rolling walker (2 wheels) Gait Pattern/deviations: Step-to pattern Gait velocity: reduced Gait velocity interpretation: <1.31 ft/sec, indicative of household ambulator   General Gait Details: slowed step-to gait, reduced foot clearance bilaterally   Stairs             Wheelchair Mobility     Tilt Bed    Modified Rankin (Stroke Patients Only)       Balance Overall balance assessment: Needs assistance Sitting-balance support: No upper extremity supported, Feet supported Sitting balance-Leahy Scale: Poor Sitting balance - Comments: posterior lean Postural control: Posterior lean Standing balance support: Bilateral upper extremity supported Standing balance-Leahy Scale: Poor                              Communication Communication Communication: Impaired Factors Affecting Communication: Difficulty expressing self (increased time to respond)  Cognition Arousal: Alert Behavior During Therapy: Flat affect   PT - Cognitive impairments: Awareness, Memory, Attention, Problem solving, Safety/Judgement, Initiation  PT - Cognition Comments: oriented to  person, place, roughly to time (reports it is Nov 1st) Following commands: Impaired Following commands impaired: Follows one step commands with increased time    Cueing Cueing Techniques: Verbal cues, Visual cues  Exercises      General Comments General comments (skin integrity, edema, etc.): VSS on RA      Pertinent Vitals/Pain Pain Assessment Pain Assessment: No/denies pain    Home Living                          Prior Function            PT Goals (current goals can now be found in the care plan section) Acute Rehab PT Goals Patient Stated Goal: none stated Progress towards PT goals: Progressing toward goals    Frequency    Min 2X/week      PT Plan      Co-evaluation              AM-PAC PT 6 Clicks Mobility   Outcome Measure  Help needed turning from your back to your side while in a flat bed without using bedrails?: A Lot Help needed moving from lying on your back to sitting on the side of a flat bed without using bedrails?: A Lot Help needed moving to and from a bed to a chair (including a wheelchair)?: A Little Help needed standing up from a chair using your arms (e.g., wheelchair or bedside chair)?: A Little Help needed to walk in hospital room?: Total Help needed climbing 3-5 steps with a railing? : Total 6 Click Score: 12    End of Session Equipment Utilized During Treatment: Gait belt Activity Tolerance: Patient tolerated treatment well Patient left: in bed;with call bell/phone within reach;with bed alarm set Nurse Communication: Mobility status PT Visit Diagnosis: Other symptoms and signs involving the nervous system (R29.898);Muscle weakness (generalized) (M62.81)     Time: 8764-8741 PT Time Calculation (min) (ACUTE ONLY): 23 min  Charges:    $Therapeutic Activity: 8-22 mins $Neuromuscular Re-education: 8-22 mins PT General Charges $$ ACUTE PT VISIT: 1 Visit                     Bernardino JINNY Ruth, PT, DPT Acute  Rehabilitation Office [phone]    Bernardino JINNY Ruth 08/02/2024, 2:17 PM

## 2024-08-02 NOTE — Plan of Care (Signed)
 EEG technologist not available at this time. His exam was reassuring We will get him hooked up to EEG as soon as technologist becomes available. I am not sure if the rapid EEG will be sensitive enough to pick up abnormalities especially given the recent flap replacement for his craniotomy.  Ernest Lav, MD Neurology

## 2024-08-02 NOTE — Progress Notes (Signed)
 Called to bedside by nursing for left sided switching and arms extended on patient.  I evaluated patient. He is awake and alert.  I evaluated him from the door first and his head was twitching to the left with his arms R>L extended.  When I verbally introduced myself he opened his eyes and the movements stopped. He was then able to follow all commands. He has a known slight L facial droop that is not new. He moves all other extremities against gravity. Sensation is intact. He is oriented.   Consulted psychiatry as we have stopped his lithium  and latuda . They do not feel this is withdrawal related.   I called Dr. Janjua. He requested neurology consulted.   I consulted neurology. Dr. Voncile came to bedside to evaluate the patient. Was not doing movements at this time. Will have overnight EEG. He requested a lithium  level which is ordered. Additionally, he is requesting we restart his Klonopin . I have restarted it at half dose. Dr. Janjua aware.   Tinnie FORBES Furth, PA-C [facility] 08/02/24 5:47 PM  Please see [email or web address] for pager details.  From 7A-7P if no response, please call [phone] After hours, please call ELink [phone]

## 2024-08-02 NOTE — Progress Notes (Signed)
 eLink Physician-Brief Progress Note Patient Name: Ernest Romero DOB: Aug 10, 1966 MRN: [id number]   Date of Service  08/02/2024  HPI/Events of Note  Patient with very poor PO intake.  eICU Interventions  Enteral nutrition resumed temporarily.        Marlowe Cinquemani U Epifania Littrell 08/02/2024, 9:30 PM

## 2024-08-02 NOTE — Progress Notes (Signed)
 Speech Language Pathology Treatment: Dysphagia  Patient Details Name: Ernest Romero MRN: [id number] DOB: 1966/06/22 Today's Date: 08/02/2024 Time: 0940-0950 SLP Time Calculation (min) (ACUTE ONLY): 10 min  Assessment / Plan / Recommendation Clinical Impression  Pt significantly more alert today, able to follow all commands and participate in conversation. He fed himself regular solids, clearing his oral cavity completely. He uses a fast rate when drinking liquids, resulting in one instance of delayed coughing but this did not recur in any further trials, including during the 3 oz water test. Recommend full supervision given potential for fluctuating mentation but will initiate a Dys 3 diet with thin liquids and f/u.    HPI HPI: 58 yo male with history of alcohol use disorder, psychiatric disorder, and HTN, who presents 10/14 from [facility], where he was admitted 9/28 after an unresponsive episode in jail. Found to have large SDH s/p L frontoparietal craniotomy with evacuation of SDH and drain placement 9/28 (removed 9/29). Followed by SLP for cognitive-linguistic goals and on a regular diet prior to transfer. Transferred to [facility] for further management of craniotomy and flab. S/p cranioplasty with bone flap replacement 10/27. Change in status thought to be related to Klonopin  administration 10/28, repeat CTH shows significant increase in size of mixed attenuation subdural fluid collection underlying the L tempoparietal craniotomy site, now measuring approximately 15 mm in thickness, with associated mild mass effect and slight rightward midline shift.      SLP Plan  Continue with current plan of care          Recommendations  Diet recommendations: Dysphagia 3 (mechanical soft);Thin liquid Liquids provided via: Cup;Straw Medication Administration: Whole meds with liquid Supervision: Staff to assist with self feeding;Full supervision/cueing for compensatory strategies Compensations: Minimize  environmental distractions;Slow rate;Small sips/bites Postural Changes and/or Swallow Maneuvers: Seated upright 90 degrees                  Oral care BID   Frequent or constant Supervision/Assistance Dysphagia, unspecified (R13.10)     Continue with current plan of care     Damien Blumenthal, M.A., CCC-SLP Speech Language Pathology, Acute Rehabilitation Services  Secure Chat preferred [phone]   08/02/2024, 10:56 AM

## 2024-08-03 ENCOUNTER — Inpatient Hospital Stay (HOSPITAL_COMMUNITY): Payer: MEDICAID

## 2024-08-03 LAB — GLUCOSE, CAPILLARY
Glucose-Capillary: 111 mg/dL — ABNORMAL HIGH (ref 70–99)
Glucose-Capillary: 139 mg/dL — ABNORMAL HIGH (ref 70–99)
Glucose-Capillary: 129 mg/dL — ABNORMAL HIGH (ref 70–99)
Glucose-Capillary: 116 mg/dL — ABNORMAL HIGH (ref 70–99)
Glucose-Capillary: 124 mg/dL — ABNORMAL HIGH (ref 70–99)
Glucose-Capillary: 120 mg/dL — ABNORMAL HIGH (ref 70–99)

## 2024-08-03 LAB — CBC
HCT: 38.5 % — ABNORMAL LOW (ref 39.0–52.0)
Hemoglobin: 12.9 g/dL — ABNORMAL LOW (ref 13.0–17.0)
MCH: 29.8 pg (ref 26.0–34.0)
MCHC: 33.5 g/dL (ref 30.0–36.0)
MCV: 88.9 fL (ref 80.0–100.0)
Platelets: 274 K/uL (ref 150–400)
RBC: 4.33 MIL/uL (ref 4.22–5.81)
RDW: 12.5 % (ref 11.5–15.5)
WBC: 6.7 K/uL (ref 4.0–10.5)
nRBC: 0 % (ref 0.0–0.2)

## 2024-08-03 LAB — BASIC METABOLIC PANEL WITH GFR
Anion gap: 12 (ref 5–15)
BUN: 18 mg/dL (ref 6–20)
CO2: 21 mmol/L — ABNORMAL LOW (ref 22–32)
Calcium: 8.9 mg/dL (ref 8.9–10.3)
Chloride: 104 mmol/L (ref 98–111)
Creatinine, Ser: 0.79 mg/dL (ref 0.61–1.24)
GFR, Estimated: >60 mL/min (ref >60)
Glucose, Bld: 103 mg/dL — ABNORMAL HIGH (ref 70–99)
Potassium: 3.3 mmol/L — ABNORMAL LOW (ref 3.5–5.1)
Sodium: 137 mmol/L (ref 135–145)

## 2024-08-03 LAB — PHOSPHORUS: Phosphorus: 2.7 mg/dL (ref 2.5–4.6)

## 2024-08-03 LAB — MAGNESIUM: Magnesium: 2 mg/dL (ref 1.7–2.4)

## 2024-08-03 MED ORDER — POTASSIUM CHLORIDE 20 MEQ PO PACK
40.0000 meq | PACK | ORAL | Status: AC
  Administered 2024-08-03 (×2): 40 meq
  Filled 2024-08-03 (×2): qty 2

## 2024-08-03 MED ORDER — ORAL CARE MOUTH RINSE: 15.0000 mL | OROMUCOSAL | Status: DC | PRN

## 2024-08-03 MED ORDER — CLONAZEPAM 0.5 MG PO TABS
0.5000 mg | ORAL_TABLET | Freq: Once | ORAL | Status: AC
  Administered 2024-08-03: 0.5 mg via ORAL
  Filled 2024-08-03: qty 1

## 2024-08-03 NOTE — Progress Notes (Signed)
 [facility] ADULT ICU REPLACEMENT PROTOCOL   The patient does apply for the [facility] Adult ICU Electrolyte Replacment Protocol based on the criteria listed below:   1.Exclusion criteria: TCTS, ECMO, Dialysis, and Myasthenia Gravis patients 2. Is GFR >/= 30 ml/min? Yes.    Patient's GFR today is >60 3. Is SCr </= 2? Yes.   Patient's SCr is 0.79 mg/dL 4. Did SCr increase >/= 0.5 in 24 hours? No. 5.Pt's weight >40kg  Yes.   6. Abnormal electrolyte(s): K+ 3.3  7. Electrolytes replaced per protocol 8.  Call MD STAT for K+ </= 2.5, Phos </= 1, or Mag </= 1 Physician:  Epimenio Ole BIRCH [facility] 08/03/2024 4:41 AM

## 2024-08-03 NOTE — Progress Notes (Signed)
 eLink Physician-Brief Progress Note Patient Name: Ernest Romero DOB: 05/31/1966 MRN: [id number]   Date of Service  08/03/2024  HPI/Events of Note  Patient is extremely anxious and unable to sleep.  eICU Interventions  Klonopin  0.5 mg po x 1 ordered.        Branon Sabine U Sapphire Tygart 08/03/2024, 1:35 AM

## 2024-08-03 NOTE — Progress Notes (Signed)
 LTM EEG hooked up and running - no initial skin breakdown - push button tested - [facility] monitoring.

## 2024-08-03 NOTE — Progress Notes (Signed)
 NEUROLOGY CONSULT FOLLOW UP NOTE   Date of service: August 03, 2024 Patient Name: Ernest Romero MRN:  [id number] DOB:  1966/01/30  Interval Hx/subjective  Seen and examined Hooked up to LTM No seizures reported. Has had off-and-on neck twitching.  Vitals   Vitals:   08/03/24 0700 08/03/24 0800 08/03/24 0900 08/03/24 1000  BP: (!) 148/101 (!) 130/92 (!) 165/104 (!) 132/96  Pulse: 63 63 75 66  Resp: 20 17 (!) 22 (!) 21  Temp:  97.6 F (36.4 C)    TempSrc:  Oral    SpO2: 100% 95% 91% 95%  Weight:      Height:         Body mass index is 20.28 kg/m.  Physical Exam   GEN: Well-developed well-nourished man in no acute distress HEENT: Postop incision on the left with drains. CVS: Regular rate rhythm  Neurologic Examination   Awake in bed Follows commands Somewhat slow to respond to questions but not having any dysarthria or aphasia. Diminished attention concentration Cranial nerves, motor and sensory exam are normal.  Medications  Current Facility-Administered Medications:    acetaminophen  (TYLENOL ) tablet 650 mg, 650 mg, Per Tube, Q4H PRN, 650 mg at 08/02/24 2112 **OR** [PENDING] acetaminophen  (TYLENOL ) tablet 650 mg, 650 mg, Per Tube, Q4H PRN **OR** acetaminophen  (TYLENOL ) suppository 650 mg, 650 mg, Rectal, Q4H PRN **OR** [PENDING] acetaminophen  (TYLENOL ) suppository 650 mg, 650 mg, Rectal, Q4H PRN,    amLODipine  (NORVASC ) tablet 10 mg, 10 mg, Per Tube, Daily, Mannam, Praveen, MD, 10 mg at 08/03/24 9061   butalbital-acetaminophen -caffeine (FIORICET) 50-325-40 MG per tablet 2 tablet, 2 tablet, Per Tube, Q4H PRN, Mannam, Praveen, MD   Chlorhexidine Gluconate Cloth 2 % PADS 6 each, 6 each, Topical, Daily, Franky Redia SAILOR, MD, 6 each at 08/03/24 9061   clonazePAM  (KLONOPIN ) tablet 0.5 mg, 0.5 mg, Oral, BID, Adolph, Lauren E, PA-C, 0.5 mg at 08/03/24 9061   docusate (COLACE) 50 MG/5ML liquid 100 mg, 100 mg, Per Tube, Daily, Mannam, Praveen, MD, 100 mg at 08/02/24 1035    docusate sodium (COLACE) capsule 100 mg, 100 mg, Oral, BID PRN, Adolph, Lauren E, PA-C   feeding supplement (OSMOLITE 1.5 CAL) liquid 1,000 mL, 1,000 mL, Per Tube, Continuous, Mannam, Praveen, MD, Last Rate: 30 mL/hr at 08/03/24 0935, Rate Change at 08/03/24 0935   feeding supplement (PROSource TF20) liquid 60 mL, 60 mL, Per Tube, Daily, Mannam, Praveen, MD, 60 mL at 08/03/24 0937   folic acid  (FOLVITE ) tablet 1 mg, 1 mg, Per Tube, Daily, Mannam, Praveen, MD, 1 mg at 08/03/24 0938   heparin injection 5,000 Units, 5,000 Units, Subcutaneous, Q12H, Autry, Lauren E, PA-C, 5,000 Units at 08/03/24 9061   labetalol  (NORMODYNE ) injection 10-40 mg, 10-40 mg, Intravenous, Q10 min PRN, Mannam, Praveen, MD, 10 mg at 08/02/24 1703   multivitamin with minerals tablet 1 tablet, 1 tablet, Per Tube, Daily, Mannam, Praveen, MD, 1 tablet at 08/03/24 0938   mupirocin  ointment (BACTROBAN ) 2 %, , Nasal, BID, Romero, Ernest M, RPH, Given at 08/03/24 9060   nicotine (NICODERM CQ - dosed in mg/24 hours) patch 14 mg, 14 mg, Transdermal, Daily, Fredia Dorothe HERO, MD, 14 mg at 08/03/24 9062   ondansetron  (ZOFRAN ) tablet 4 mg, 4 mg, Oral, Q4H PRN **OR** ondansetron  (ZOFRAN ) injection 4 mg, 4 mg, Intravenous, Q4H PRN, Janjua, Rashid M, MD, 4 mg at 08/01/24 1506   Oral care mouth rinse, 15 mL, Mouth Rinse, PRN, Mannam, Praveen, MD   pantoprazole (PROTONIX) injection 40 mg, 40  mg, Intravenous, QHS, Janjua, Rashid M, MD, 40 mg at 08/02/24 2112   polyethylene glycol (MIRALAX / GLYCOLAX) packet 17 g, 17 g, Oral, Daily PRN, Adolph, Lauren E, PA-C   promethazine (PHENERGAN) tablet 12.5-25 mg, 12.5-25 mg, Oral, Q4H PRN, Janjua, Rashid M, MD   senna (SENOKOT) tablet 8.6 mg, 1 tablet, Per Tube, Daily, Mannam, Praveen, MD, 8.6 mg at 08/02/24 1035   sodium chloride  flush (NS) 0.9 % injection 10-40 mL, 10-40 mL, Intracatheter, Q12H, Franky Redia SAILOR, MD, 10 mL at 08/03/24 0939   sodium chloride  flush (NS) 0.9 % injection 10-40 mL, 10-40 mL,  Intracatheter, PRN, Franky Redia SAILOR, MD, 30 mL at 07/29/24 1135   thiamine  (VITAMIN B1) tablet 100 mg, 100 mg, Per Tube, Daily, Mannam, Praveen, MD, 100 mg at 08/03/24 0938  Labs and Diagnostic Imaging   CBC:  Recent Labs  Lab 08/02/24 0225 08/03/24 0245  WBC 11.2* 6.7  HGB 13.8 12.9*  HCT 42.5 38.5*  MCV 90.8 88.9  PLT 283 274    Basic Metabolic Panel:  Lab Results  Component Value Date   NA 137 08/03/2024   K 3.3 (L) 08/03/2024   CO2 21 (L) 08/03/2024   GLUCOSE 103 (H) 08/03/2024   BUN 18 08/03/2024   CREATININE 0.79 08/03/2024   CALCIUM 8.9 08/03/2024   GFRNONAA >60 08/03/2024   GFRAA >60 09/16/2018   Lipid Panel: No results found for: LDLCALC HgbA1c: No results found for: HGBA1C Urine Drug Screen:     Component Value Date/Time   LABOPIA NONE DETECTED 06/30/2024 0905   LABOPIA NEGATIVE 06/16/2024 0924   COCAINSCRNUR POSITIVE (A) 06/30/2024 0905   LABBENZ POSITIVE (A) 06/30/2024 0905   LABBENZ NEGATIVE 06/16/2024 0924   AMPHETMU NONE DETECTED 06/30/2024 0905   AMPHETMU NEGATIVE 06/16/2024 0924   THCU POSITIVE (A) 06/30/2024 0905   THCU POSITIVE (A) 06/16/2024 0924   LABBARB NONE DETECTED 06/30/2024 0905   LABBARB NEGATIVE 06/16/2024 0924    Alcohol Level     Component Value Date/Time   ETH <15 06/30/2024 0833   INR  Lab Results  Component Value Date   INR 1.0 06/30/2024   APTT  Lab Results  Component Value Date   APTT 30 06/30/2024   Lithium  level 1.07 on 07/29/2024   Imaging personally reviewed CT head done at 0400 hrs. today-interval decrease in the size of extra-axial fluid collection along the left cerebral convexity with 2 drainage catheters in place, small pneumocephalus and significantly less mass effect upon the left cerebral hemisphere.  The left lateral ventricle and cerebral sulci are no longer effaced.  Decreased rightward midline shift-now only 2 mm.  Status post left pterional cranioplasty.  Overlying soft tissue  swelling  Assessment   Ernest Romero is a 58 y.o. male past history of alcoholism, bipolar disorder, panic disorder presented to [facility] ED on 06/30/2024 for unresponsiveness found to have recurrent subdural hematoma, taken for emergent craniotomy with evacuation of the hematoma and placement of drain that was eventually removed.  Watched on alcohol withdrawal protocol, treated with propofol which was then tapered.  Transfer to [facility] for flap replacement of his craniotomy.  Noted to have some head twitching and bilateral arm twitching movements for which neurology was consulted His psychiatric medications were held for somnolence. Psychiatry is following the patient Plan is to do long-term EEG and attempt to capture these events. Clinical exam looks reassuring at this time Unclear if these twitching episodes were related to abrupt medicine sensation of his psychiatric  medications or are electrographic derangements.   Recommendations  LTM EEG was unable to be hooked overnight-LTM EEG has been hooked up this morning. I will keep him on LTM EEG for a day to ensure we are not missing any underlying seizures I will hold off on using any antiseizure medications unless EEG shows many seizures Replete thiamine  and folate CIWA protocol Resumed his benzos at low-dose yesterday Appreciate psychiatry consult for recommendations on resuming psychiatric medications Plan discussed with Dr. Forrestine. Will follow ______________________________________________________________________   Signed, Eligio Lav, MD [facility]

## 2024-08-03 NOTE — Progress Notes (Signed)
 LTM maint complete - no skin breakdown seen.  Reapplied C3, P3, T7 and moved cart to see patient.  [facility] monitored, Event button test confirmed by [facility].

## 2024-08-03 NOTE — Progress Notes (Signed)
 NAME:  Ernest Romero, MRN:  [id number], DOB:  26-Nov-1965, LOS: 19 ADMISSION DATE:  07/15/2024, CONSULTATION DATE: 07/29/2024 REFERRING MD:  JONELLE Sable MD, CHIEF COMPLAINT:   Postcraniotomy and bone flap  History of Present Illness:  58 year old male with past medical history of alcohol abuse, bipolar disorder, panic disorder who [facility] to [facility] jail [facility] September for large subdural hematoma s/p left frontal craniotomy and evacuation of bleed. He was transferred from [facility] to [facility] for flap replacement. While at [facility], patient was on alcohol withdrawal taper of phenobarbital which was discontinued prior to transfer.   Pertinent  Medical History    has a past medical history of Anxiety, Bipolar 1 disorder (HCC), Depression, Hepatitis, and Hypertension.   Significant Hospital Events: Including procedures, antibiotic start and stop dates in addition to other pertinent events   10/27 crani w/ flap replacement 10/29: off clevi, liberate SBP goal, Klonopin , lithium , Haldol  as needed discontinued.  Latuda  dose cut by half 10/30: repeat CT head with increased collection - going to OR this afternoon for evacuation 10/31: OR yesterday for [facility] evacuation.  Developed arm twitching.  Klonopin  restarted at half dose 11/1: More awake.  No complaint  Interim History / Subjective:   More awake today.  No complaint  Objective    Blood pressure (!) 148/101, pulse 63, temperature 97.6 F (36.4 C), temperature source Oral, resp. rate 20, height 5' 10 (1.778 m), weight 64.1 kg, SpO2 100%.        Intake/Output Summary (Last 24 hours) at 08/03/2024 1018 Last data filed at 08/03/2024 0700 Gross per 24 hour  Intake 2086.38 ml  Output 1810 ml  Net 276.38 ml   Filed Weights   07/16/24 0800 08/01/24 0500 08/03/24 0500  Weight: 67.5 kg 64.6 kg 64.1 kg    Examination: Blood pressure (!) 148/101, pulse 63, temperature 97.6 F (36.4 C), temperature source Oral, resp. rate 20, height 5' 10 (1.778 m),  weight 64.1 kg, SpO2 100%. Chronically ill-appearing man, craniotomy scar Awake, slow affect.  No complaints.  No focal deficits Heart rate is regular rate and rhythm Lungs are clear to auscultation  Lab/imaging reviewed Significant for potassium 3.3, BUN/creatinine 18/0.79 Hemoglobin 12.9 CT head with stable subdural fluid collection.  Resolved problem list   Assessment and Plan  Subdural hematoma s/p left cranioplasty 10/27 Altered mental status  Status post frontoparietal craniotomy on 06/30/2024 Underwent left-sided allograft cranioplasty, flap 10/27 10/30 AM CT head with increased fluid collection up to 23mm from 17 10/31: OR for [facility] evacuation  Neurosurgery following.  Drains to be removed today Continue SBP goal less than 160.  On amlodipine , as needed labetalol  Neurochecks  Leukocytosis  WBC 11.8>11.2>12.8>14.3. recent surgical procedure but continues to uptrend. Low grade temperatures at 99. Has been some question of aspirating but no increasing oxygen requirement. Lungs are clear. No foley or central lines. No culprit meds. Procal negative. CXR okay. WBC coming down 10/31 Observe for now off antibiotic  Bipolar disorder - psych following  - will dc safety sitter, does not need si/hi sitter  Continue Latuda  at half dose.  Klonopin  restarted at half dose overnight due to concern for benzo withdrawal On EEG monitoring for twitching movements.  Likely to be benzo withdrawal.  History of alcohol, tobacco abuse Was on a phenobarb taper at [facility]. No currently withdrawal symptoms - nicotine patch  - thiamine , folic acid , multivitamin  - monitor for withdrawal symptoms, may need reinstatement of CIWA vs lower dose Lithium  and Latuda  as  above - issues with sedation/somnolence and clouding neuro exam with SDH. Need to balance   HTN - amlodipine  10mg  daily  - prn labetalol    Critical care time:    The patient is critically ill with multiple organ system failure and requires  high complexity decision making for assessment and support, frequent evaluation and titration of therapies, advanced monitoring, review of radiographic studies and interpretation of complex data.   Critical Care Time devoted to patient care services, exclusive of separately billable procedures, described in this note is 35 minutes.   Zimri Brennen MD [facility] See Amion for pager  If no response to pager , please call [phone] until 7pm After 7:00 pm call Elink  [phone] 08/03/2024, 10:25 AM

## 2024-08-04 ENCOUNTER — Inpatient Hospital Stay (HOSPITAL_COMMUNITY): Payer: MEDICAID

## 2024-08-04 DIAGNOSIS — R569 Unspecified convulsions: Secondary | ICD-10-CM

## 2024-08-04 LAB — BASIC METABOLIC PANEL WITH GFR
Anion gap: 8 (ref 5–15)
BUN: 14 mg/dL (ref 6–20)
CO2: 22 mmol/L (ref 22–32)
Calcium: 8.7 mg/dL — ABNORMAL LOW (ref 8.9–10.3)
Chloride: 107 mmol/L (ref 98–111)
Creatinine, Ser: 0.84 mg/dL (ref 0.61–1.24)
GFR, Estimated: >60 mL/min (ref >60)
Glucose, Bld: 119 mg/dL — ABNORMAL HIGH (ref 70–99)
Potassium: 3.5 mmol/L (ref 3.5–5.1)
Sodium: 137 mmol/L (ref 135–145)

## 2024-08-04 LAB — CBC
HCT: 37.7 % — ABNORMAL LOW (ref 39.0–52.0)
Hemoglobin: 12.6 g/dL — ABNORMAL LOW (ref 13.0–17.0)
MCH: 29.6 pg (ref 26.0–34.0)
MCHC: 33.4 g/dL (ref 30.0–36.0)
MCV: 88.7 fL (ref 80.0–100.0)
Platelets: 302 K/uL (ref 150–400)
RBC: 4.25 MIL/uL (ref 4.22–5.81)
RDW: 12.7 % (ref 11.5–15.5)
WBC: 6.1 K/uL (ref 4.0–10.5)
nRBC: 0.3 % — ABNORMAL HIGH (ref 0.0–0.2)

## 2024-08-04 LAB — GLUCOSE, CAPILLARY
Glucose-Capillary: 121 mg/dL — ABNORMAL HIGH (ref 70–99)
Glucose-Capillary: 161 mg/dL — ABNORMAL HIGH (ref 70–99)
Glucose-Capillary: 124 mg/dL — ABNORMAL HIGH (ref 70–99)
Glucose-Capillary: 128 mg/dL — ABNORMAL HIGH (ref 70–99)
Glucose-Capillary: 113 mg/dL — ABNORMAL HIGH (ref 70–99)

## 2024-08-04 LAB — PHOSPHORUS: Phosphorus: 3.1 mg/dL (ref 2.5–4.6)

## 2024-08-04 MED ORDER — SODIUM BICARBONATE 650 MG PO TABS
650.0000 mg | ORAL_TABLET | Freq: Once | ORAL | Status: AC
  Administered 2024-08-04: 650 mg
  Filled 2024-08-04: qty 1

## 2024-08-04 MED ORDER — PANCRELIPASE (LIP-PROT-AMYL) 10440-39150 UNITS PO TABS
20880.0000 [IU] | ORAL_TABLET | Freq: Once | ORAL | Status: AC
  Administered 2024-08-04: 20880 [IU]
  Filled 2024-08-04: qty 2

## 2024-08-04 NOTE — Progress Notes (Addendum)
 NEUROLOGY CONSULT FOLLOW UP NOTE   Date of service: August 04, 2024 Patient Name: Ernest Romero MRN:  [id number] DOB:  Feb 11, 1966  Interval Hx/subjective  Seen and examined LTM negative for seizures.   Vitals   Vitals:   08/04/24 0600 08/04/24 0700 08/04/24 0800 08/04/24 0900  BP: 123/87 (!) 135/95 (!) 129/90 126/82  Pulse: 60  64 (!) 56  Resp: 15 (!) 21 18 17   Temp:  98.7 F (37.1 C)    TempSrc:  Axillary    SpO2: 96%  96% 95%  Weight:      Height:         Body mass index is 20.28 kg/m.  Physical Exam   GEN: Well-developed well-nourished man in no acute distress HEENT: Postop incision on the left with drains. CVS: Regular rate rhythm  Neurologic Examination   Awake in bed Follows commands Somewhat slow to respond to questions but not having any dysarthria or aphasia. Diminished attention concentration Cranial nerves, motor and sensory exam are normal. Unchanged exam  Medications  Current Facility-Administered Medications:    acetaminophen  (TYLENOL ) tablet 650 mg, 650 mg, Per Tube, Q4H PRN, 650 mg at 08/02/24 2112 **OR** [PENDING] acetaminophen  (TYLENOL ) tablet 650 mg, 650 mg, Per Tube, Q4H PRN **OR** acetaminophen  (TYLENOL ) suppository 650 mg, 650 mg, Rectal, Q4H PRN **OR** [PENDING] acetaminophen  (TYLENOL ) suppository 650 mg, 650 mg, Rectal, Q4H PRN,    amLODipine  (NORVASC ) tablet 10 mg, 10 mg, Per Tube, Daily, Mannam, Praveen, MD, 10 mg at 08/03/24 9061   butalbital-acetaminophen -caffeine (FIORICET) 50-325-40 MG per tablet 2 tablet, 2 tablet, Per Tube, Q4H PRN, Mannam, Praveen, MD   Chlorhexidine Gluconate Cloth 2 % PADS 6 each, 6 each, Topical, Daily, Franky Redia SAILOR, MD, 6 each at 08/03/24 9061   clonazePAM  (KLONOPIN ) tablet 0.5 mg, 0.5 mg, Oral, BID, Autry, Lauren E, PA-C, 0.5 mg at 08/03/24 2132   docusate (COLACE) 50 MG/5ML liquid 100 mg, 100 mg, Per Tube, Daily, Mannam, Praveen, MD, 100 mg at 08/02/24 1035   docusate sodium (COLACE) capsule 100 mg,  100 mg, Oral, BID PRN, Adolph, Lauren E, PA-C   feeding supplement (OSMOLITE 1.5 CAL) liquid 1,000 mL, 1,000 mL, Per Tube, Continuous, Mannam, Praveen, MD, Last Rate: 30 mL/hr at 08/04/24 0900, Infusion Verify at 08/04/24 0900   feeding supplement (PROSource TF20) liquid 60 mL, 60 mL, Per Tube, Daily, Mannam, Praveen, MD, 60 mL at 08/03/24 0937   folic acid  (FOLVITE ) tablet 1 mg, 1 mg, Per Tube, Daily, Mannam, Praveen, MD, 1 mg at 08/03/24 0938   heparin injection 5,000 Units, 5,000 Units, Subcutaneous, Q12H, Autry, Lauren E, PA-C, 5,000 Units at 08/03/24 2133   labetalol  (NORMODYNE ) injection 10-40 mg, 10-40 mg, Intravenous, Q10 min PRN, Mannam, Praveen, MD, 10 mg at 08/02/24 1703   multivitamin with minerals tablet 1 tablet, 1 tablet, Per Tube, Daily, Mannam, Praveen, MD, 1 tablet at 08/03/24 0938   mupirocin  ointment (BACTROBAN ) 2 %, , Nasal, BID, Denninger, Jade M, RPH, Given at 08/03/24 2134   nicotine (NICODERM CQ - dosed in mg/24 hours) patch 14 mg, 14 mg, Transdermal, Daily, Fredia Dorothe HERO, MD, 14 mg at 08/03/24 9062   ondansetron  (ZOFRAN ) tablet 4 mg, 4 mg, Oral, Q4H PRN **OR** ondansetron  (ZOFRAN ) injection 4 mg, 4 mg, Intravenous, Q4H PRN, Janjua, Rashid M, MD, 4 mg at 08/01/24 1506   Oral care mouth rinse, 15 mL, Mouth Rinse, PRN, Mannam, Praveen, MD   pantoprazole (PROTONIX) injection 40 mg, 40 mg, Intravenous, QHS, Janjua, Rashid M, MD,  40 mg at 08/03/24 2131   polyethylene glycol (MIRALAX / GLYCOLAX) packet 17 g, 17 g, Oral, Daily PRN, Adolph, Lauren E, PA-C   promethazine (PHENERGAN) tablet 12.5-25 mg, 12.5-25 mg, Oral, Q4H PRN, Janjua, Rashid M, MD   senna (SENOKOT) tablet 8.6 mg, 1 tablet, Per Tube, Daily, Mannam, Praveen, MD, 8.6 mg at 08/02/24 1035   sodium chloride  flush (NS) 0.9 % injection 10-40 mL, 10-40 mL, Intracatheter, Q12H, Franky Redia SAILOR, MD, 10 mL at 08/03/24 2134   sodium chloride  flush (NS) 0.9 % injection 10-40 mL, 10-40 mL, Intracatheter, PRN, Franky Redia SAILOR, MD, 30 mL at 07/29/24 1135   thiamine  (VITAMIN B1) tablet 100 mg, 100 mg, Per Tube, Daily, Mannam, Praveen, MD, 100 mg at 08/03/24 0938  Labs and Diagnostic Imaging   CBC:  Recent Labs  Lab 08/03/24 0245 08/04/24 0331  WBC 6.7 6.1  HGB 12.9* 12.6*  HCT 38.5* 37.7*  MCV 88.9 88.7  PLT 274 302    Basic Metabolic Panel:  Lab Results  Component Value Date   NA 137 08/04/2024   K 3.5 08/04/2024   CO2 22 08/04/2024   GLUCOSE 119 (H) 08/04/2024   BUN 14 08/04/2024   CREATININE 0.84 08/04/2024   CALCIUM 8.7 (L) 08/04/2024   GFRNONAA >60 08/04/2024   GFRAA >60 09/16/2018   Lipid Panel: No results found for: LDLCALC HgbA1c: No results found for: HGBA1C Urine Drug Screen:     Component Value Date/Time   LABOPIA NONE DETECTED 06/30/2024 0905   LABOPIA NEGATIVE 06/16/2024 0924   COCAINSCRNUR POSITIVE (A) 06/30/2024 0905   LABBENZ POSITIVE (A) 06/30/2024 0905   LABBENZ NEGATIVE 06/16/2024 0924   AMPHETMU NONE DETECTED 06/30/2024 0905   AMPHETMU NEGATIVE 06/16/2024 0924   THCU POSITIVE (A) 06/30/2024 0905   THCU POSITIVE (A) 06/16/2024 0924   LABBARB NONE DETECTED 06/30/2024 0905   LABBARB NEGATIVE 06/16/2024 0924    Alcohol Level     Component Value Date/Time   ETH <15 06/30/2024 0833   INR  Lab Results  Component Value Date   INR 1.0 06/30/2024   APTT  Lab Results  Component Value Date   APTT 30 06/30/2024   Lithium  level 1.07 on 07/29/2024   Imaging personally reviewed CT head done at 0400 hrs. today-interval decrease in the size of extra-axial fluid collection along the left cerebral convexity with 2 drainage catheters in place, small pneumocephalus and significantly less mass effect upon the left cerebral hemisphere.  The left lateral ventricle and cerebral sulci are no longer effaced.  Decreased rightward midline shift-now only 2 mm.  Status post left pterional cranioplasty.  Overlying soft tissue swelling   cEEG unremarkable  Assessment    BARY LIMBACH is a 58 y.o. male past history of alcoholism, bipolar disorder, panic disorder presented to [facility] ED on 06/30/2024 for unresponsiveness found to have recurrent subdural hematoma, taken for emergent craniotomy with evacuation of the hematoma and placement of drain that was eventually removed.  Watched on alcohol withdrawal protocol, treated with propofol which was then tapered.  Transfer to [facility] for flap replacement of his craniotomy.  Noted to have some head twitching and bilateral arm twitching movements for which neurology was consulted His psychiatric medications were held for somnolence. Psychiatry is following the patient LTM negative for seizures. Clinical exam looks reassuring at this time Unclear if these twitching episodes were related to abrupt medicine sensation of his psychiatric medications or are electrographic derangements. In either case - no need for  further eeg/.  Recommendations  No need for ASD Replete thiamine  and folate CIWA protocol Resumed his benzos at low-dose Appreciate psychiatry consult for recommendations on resuming psychiatric medications Plan discussed with Dr. Theophilus & Dr. Janjua Will be available PRN ______________________________________________________________________   Signed, Eligio Lav, MD [facility]

## 2024-08-04 NOTE — Plan of Care (Signed)
  Problem: Education: Goal: Knowledge of General Education information will improve Description: Including pain rating scale, medication(s)/side effects and non-pharmacologic comfort measures Outcome: Progressing   Problem: Health Behavior/Discharge Planning: Goal: Ability to manage health-related needs will improve Outcome: Progressing   Problem: Clinical Measurements: Goal: Ability to maintain clinical measurements within normal limits will improve Outcome: Progressing Goal: Will remain free from infection Outcome: Progressing Goal: Diagnostic test results will improve Outcome: Progressing Goal: Respiratory complications will improve Outcome: Progressing Goal: Cardiovascular complication will be avoided Outcome: Progressing   Problem: Activity: Goal: Risk for activity intolerance will decrease Outcome: Progressing   Problem: Nutrition: Goal: Adequate nutrition will be maintained Outcome: Progressing   Problem: Coping: Goal: Level of anxiety will decrease Outcome: Progressing   Problem: Elimination: Goal: Will not experience complications related to urinary retention Outcome: Progressing   Problem: Pain Managment: Goal: General experience of comfort will improve and/or be controlled Outcome: Progressing   Problem: Skin Integrity: Goal: Risk for impaired skin integrity will decrease Outcome: Progressing

## 2024-08-04 NOTE — Progress Notes (Signed)
 eLink Physician-Brief Progress Note Patient Name: Ernest Romero DOB: 03-27-1966 MRN: [id number]   Date of Service  08/04/2024  HPI/Events of Note  Pt with multiple attempts to get up from bed, triggering bed alarm.  Pt had been promising to stay in bed, but requires frequent reorientation    eICU Interventions  Placed order for posey belt to prevent pt from pulling out his leads/lines and injuring himself.         Jhanvi Drakeford M DELA CRUZ 08/04/2024, 12:34 AM

## 2024-08-04 NOTE — Progress Notes (Signed)
 NAME:  Ernest Romero, MRN:  [id number], DOB:  11-Oct-1965, LOS: 20 ADMISSION DATE:  07/15/2024, CONSULTATION DATE: 07/29/2024 REFERRING MD:  JONELLE Sable MD, CHIEF COMPLAINT:   Postcraniotomy and bone flap  History of Present Illness:  58 year old male with past medical history of alcohol abuse, bipolar disorder, panic disorder who presented to [facility] from jail in September for large subdural hematoma s/p left frontal craniotomy and evacuation of bleed. He was transferred from [facility] to [facility] for flap replacement. While at [facility], patient was on alcohol withdrawal taper of phenobarbital which was discontinued prior to transfer.   Pertinent  Medical History    has a past medical history of Anxiety, Bipolar 1 disorder (HCC), Depression, Hepatitis, and Hypertension.   Significant Hospital Events: Including procedures, antibiotic start and stop dates in addition to other pertinent events   10/27 crani w/ flap replacement 10/29: off clevi, liberate SBP goal, Klonopin , lithium , Haldol  as needed discontinued.  Latuda  dose cut by half 10/30: repeat CT head with increased collection - going to OR this afternoon for evacuation 10/31: OR yesterday for SDH evacuation.  Developed arm twitching.  Klonopin  restarted at half dose 11/1: More awake.  No complaint  Interim History / Subjective:   Awake oriented Objective    Blood pressure 126/82, pulse (!) 56, temperature 98.7 F (37.1 C), temperature source Axillary, resp. rate 17, height 5' 10 (1.778 m), weight 64.1 kg, SpO2 95%.        Intake/Output Summary (Last 24 hours) at 08/04/2024 1032 Last data filed at 08/04/2024 1000 Gross per 24 hour  Intake 907.17 ml  Output 1675 ml  Net -767.83 ml   Filed Weights   07/16/24 0800 08/01/24 0500 08/03/24 0500  Weight: 67.5 kg 64.6 kg 64.1 kg    Examination: Blood pressure (!) 148/101, pulse 63, temperature 97.6 F (36.4 C), temperature source Oral, resp. rate 20, height 5' 10 (1.778 m), weight 64.1 kg, SpO2  100%. Chronically ill-appearing man, craniotomy scar Awake, slow affect.  No complaints.  No focal deficits Heart rate is regular rate and rhythm Lungs are clear to auscultation  Lab/imaging reviewed BMP and CBC are stable No new imaging  Resolved problem list   Assessment and Plan  Subdural hematoma s/p left cranioplasty 10/27 Altered mental status  Status post frontoparietal craniotomy on 06/30/2024 Underwent left-sided allograft cranioplasty, flap 10/27 10/30 AM CT head with increased fluid collection up to 23mm from 17 10/31: OR for SDH evacuation, drains removed 10/31 Continue SBP goal less than 160.  On amlodipine , as needed labetalol  Neurochecks  Leukocytosis  WBC 11.8>11.2>12.8>14.3. recent surgical procedure but continues to uptrend. Low grade temperatures at 99. Has been some question of aspirating but no increasing oxygen requirement. Lungs are clear. No foley or central lines. No culprit meds. Procal negative. CXR okay. WBC coming down 10/31 Observe for now off antibiotic  Bipolar disorder - psych following  - will dc safety sitter, does not need si/hi sitter  Continue Latuda  at half dose.  Klonopin  restarted at half dose due to concern for benzo withdrawal and twitching motion No evidence of seizures on EEG  History of alcohol, tobacco abuse Was on a phenobarb taper at [facility]. No currently withdrawal symptoms - nicotine patch  - thiamine , folic acid , multivitamin  - monitor for withdrawal symptoms, may need reinstatement of CIWA vs lower dose Lithium  and Latuda  as above - issues with sedation/somnolence and clouding neuro exam with SDH. Need to balance   HTN - amlodipine  10mg  daily  -  prn labetalol    Stable for transfer out of ICU and back to hospitalist service.  Critical care time: NA   Lonna Coder MD [facility] See Amion for pager  If no response to pager , please call [phone] until 7pm After 7:00 pm call Elink   [phone] 08/04/2024, 10:32 AM

## 2024-08-04 NOTE — Procedures (Addendum)
 Patient Name: Ernest Romero  MRN: [id number]  Epilepsy Attending: Arlin MALVA Krebs  Referring Physician/Provider: Remi Pippin, NP  Duration: 08/03/2024 9178 to 08/04/2024 9177  Patient history: 58 y.o. male presented for unresponsiveness.  EEG to evaluate for seizure  Level of alertness: Awake, asleep  AEDs during EEG study: clonazepam   Technical aspects: This EEG study was done with scalp electrodes positioned according to the 10-20 International system of electrode placement. Electrical activity was reviewed with band pass filter of 1-70Hz , sensitivity of 7 uV/mm, display speed of 64mm/sec with a 60Hz  notched filter applied as appropriate. EEG data were recorded continuously and digitally stored.  Video monitoring was available and reviewed as appropriate.  Description: The posterior dominant rhythm consists of 8-9Hz  activity of moderate voltage (25-35 uV) seen predominantly in posterior head regions, symmetric and reactive to eye opening and eye closing. Sleep was characterized by vertex waves, sleep spindles (12 to 14 Hz), maximal frontocentral region. There is intermittent generalized 3 to 6 Hz theta-delta slowing. Hyperventilation and photic stimulation were not performed.     ABNORMALITY - Intermittent slow, generalized  IMPRESSION: This study is suggestive of generalized cerebral dysfunction (encephalopathy). No seizures or epileptiform discharges were seen throughout the recording.  Suraj Ramdass O Gabrielle Mester

## 2024-08-04 NOTE — Plan of Care (Signed)
  Problem: Clinical Measurements: Goal: Will remain free from infection Outcome: Progressing   Problem: Coping: Goal: Level of anxiety will decrease Outcome: Not Progressing   Problem: Elimination: Goal: Will not experience complications related to urinary retention Outcome: Progressing

## 2024-08-04 NOTE — Progress Notes (Signed)
 LTM EEG disconnected - no skin breakdown at [facility]. [facility] notified.

## 2024-08-05 DIAGNOSIS — G9341 Metabolic encephalopathy: Secondary | ICD-10-CM | POA: Insufficient documentation

## 2024-08-05 LAB — BASIC METABOLIC PANEL WITH GFR
Anion gap: 9 (ref 5–15)
BUN: 14 mg/dL (ref 6–20)
CO2: 23 mmol/L (ref 22–32)
Calcium: 8.8 mg/dL — ABNORMAL LOW (ref 8.9–10.3)
Chloride: 105 mmol/L (ref 98–111)
Creatinine, Ser: 0.91 mg/dL (ref 0.61–1.24)
GFR, Estimated: >60 mL/min (ref >60)
Glucose, Bld: 140 mg/dL — ABNORMAL HIGH (ref 70–99)
Potassium: 3.5 mmol/L (ref 3.5–5.1)
Sodium: 137 mmol/L (ref 135–145)

## 2024-08-05 LAB — GLUCOSE, CAPILLARY
Glucose-Capillary: 109 mg/dL — ABNORMAL HIGH (ref 70–99)
Glucose-Capillary: 136 mg/dL — ABNORMAL HIGH (ref 70–99)
Glucose-Capillary: 100 mg/dL — ABNORMAL HIGH (ref 70–99)
Glucose-Capillary: 117 mg/dL — ABNORMAL HIGH (ref 70–99)
Glucose-Capillary: 109 mg/dL — ABNORMAL HIGH (ref 70–99)
Glucose-Capillary: 103 mg/dL — ABNORMAL HIGH (ref 70–99)

## 2024-08-05 LAB — CBC
HCT: 38.4 % — ABNORMAL LOW (ref 39.0–52.0)
Hemoglobin: 12.8 g/dL — ABNORMAL LOW (ref 13.0–17.0)
MCH: 29.6 pg (ref 26.0–34.0)
MCHC: 33.3 g/dL (ref 30.0–36.0)
MCV: 88.9 fL (ref 80.0–100.0)
Platelets: 294 K/uL (ref 150–400)
RBC: 4.32 MIL/uL (ref 4.22–5.81)
RDW: 12.5 % (ref 11.5–15.5)
WBC: 5.1 K/uL (ref 4.0–10.5)
nRBC: 0 % (ref 0.0–0.2)

## 2024-08-05 MED ORDER — ENSURE PLUS HIGH PROTEIN PO LIQD
237.0000 mL | Freq: Two times a day (BID) | ORAL | Status: DC
  Administered 2024-08-05 – 2024-11-05 (×143): 237 mL via ORAL
  Filled 2024-08-05: qty 237

## 2024-08-05 MED ORDER — LABETALOL HCL 5 MG/ML IV SOLN
10.0000 mg | INTRAVENOUS | Status: DC | PRN
  Administered 2024-08-06 – 2024-08-14 (×2): 10 mg via INTRAVENOUS
  Filled 2024-08-05 (×2): qty 4
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
## 2024-08-05 NOTE — Assessment & Plan Note (Signed)
 as evidenced by energy intake < or equal to 50% for > or equal to 5 days, severe muscle depletion, moderate fat depletion.  - Consult dietitian - Continue nutritional supplements
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
## 2024-08-05 NOTE — Assessment & Plan Note (Signed)
 Had withdrawal delirium at [facility].  Treated with phenobarbital taper at [facility].

## 2024-08-05 NOTE — Progress Notes (Signed)
 Nutrition Follow-up  DOCUMENTATION CODES:   Severe malnutrition in context of acute illness/injury  INTERVENTION:   Encourage PO intake at meals  Ensure Plus High Protein po BID, each supplement provides 350 kcal and 20 grams of protein.  D/C ProSource TF20  Recommend remove cortrak, discussed with RN  NUTRITION DIAGNOSIS:   Severe Malnutrition related to acute illness as evidenced by energy intake < or equal to 50% for > or equal to 5 days, severe muscle depletion, moderate fat depletion. Ongoing.   GOAL:   Patient will meet greater than or equal to 90% of their needs Progressing with diet advancement  MONITOR:   TF tolerance, Labs, Weight trends  REASON FOR ASSESSMENT:   Malnutrition Screening Tool, NPO/Clear Liquid Diet    ASSESSMENT:   PMH significant for ETOH abuse, HTN, bipolar disorder, anxiety/depression, hepatitis, craniotomy 06/30/24 for SDH during incarceration, presented on 10/13 for management of craniotomy s/p craniotomy with flap replacement 10/27.  Spoke with RN who reports pt is eating well but does have periods of waxing and waning. Currently pt did not wake to voice or to having blood sugar checked by NT. Per NT pt does wake to eat and has been eating well.  Per CCM pt to transfer to hospitalist service   9/28 - admitted to [facility] with SDH s/p post prontoparietal craniotomy with evacuation 10/13 - tx to [facility] 10/27 - s/p L allograft cranioplasty with flap replacement 10/29 - cortrak tube placed; xray with tip in descending duodenum  10/31 - to OR for SDH evacuation; diet advanced to Dysphagia 3; eating 75% of meals    Medications reviewed and include: colace, folic acid , MVI with minerals, protonix, thiamine    Labs reviewed:  CBG's: 100-136  Diet Order:   Diet Order             DIET DYS 3 Room service appropriate? Yes with Assist; Fluid consistency: Thin  Diet effective now                   EDUCATION NEEDS:   Not appropriate for  education at this time  Skin:  Skin Assessment: Skin Integrity Issues: Skin Integrity Issues:: Incisions Incisions: closed surgical incision to head  Last BM:  11/1  Height:   Ht Readings from Last 1 Encounters:  07/16/24 5' 10 (1.778 m)    Weight:   Wt Readings from Last 1 Encounters:  08/03/24 64.1 kg    Ideal Body Weight:  75.5 kg  BMI:  Body mass index is 20.28 kg/m.  Estimated Nutritional Needs:   Kcal:  2100-2400  Protein:  110-130  Fluid:  2100-2400  Keltie Labell P., RD, LDN, CNSC See AMiON for contact information

## 2024-08-05 NOTE — Assessment & Plan Note (Signed)
 BP controlled - Continue amlodipine

## 2024-08-05 NOTE — Progress Notes (Addendum)
 Physical Therapy Treatment Patient Details Name: Ernest Romero MRN: [id number] DOB: 1966/07/25 Today's Date: 08/05/2024   History of Present Illness Pt is a 58 y.o. male presenting 10/14 from [facility] where he was admitted 9/28 for unresponsive episode in the jail. 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 On CIWA protocol. Transferred to [facility] for further management of craniotomy and flap. 10/27 s/p cranioplasty with bone flap replacement. 10/28 Change in status thought to be related to Klonopin  administration 10/28, repeat CTH shows significant increase in size of mixed attenuation subdural fluid collection underlying the L tempoparietal craniotomy site, now measuring approximately 15 mm in thickness, with associated mild mass effect and slight rightward midline shift. OR on 10/30 for SDH evacuation. PMH alcohol use disorder, psychiatric disorder, HTN    PT Comments  Pt sleeping soundly upon PT arrival to room, wakes easily and agreeable to PT session focused on functional mobility. Pt tolerating transfer-level mobility with single UE support to/from [facility], pt's main goal to get to [facility] to have BM. Once pt is done toileting, pt states I think that's all, I need to lay down. VSS, PT to continue to progress pt as able.      If plan is discharge home, recommend the following: A lot of help with bathing/dressing/bathroom;Assistance with cooking/housework;Direct supervision/assist for medications management;Direct supervision/assist for financial management;Assist for transportation;Help with stairs or ramp for entrance;Supervision due to cognitive status;A little help with walking and/or transfers   Can travel by private vehicle        Equipment Recommendations  Wheelchair (measurements PT);Wheelchair cushion (measurements PT)    Recommendations for Other Services       Precautions / Restrictions  Precautions Precautions: Fall Recall of Precautions/Restrictions: Impaired Precaution/Restrictions Comments: BP <160 Restrictions Weight Bearing Restrictions Per Provider Order: No     Mobility  Bed Mobility Overal bed mobility: Needs Assistance Bed Mobility: Supine to Sit, Sit to Supine     Supine to sit: Min assist, HOB elevated Sit to supine: Min assist, HOB elevated   General bed mobility comments: assist for trunk management, scooting to/from EOB with assist of bed pad. Posterior bias    Transfers Overall transfer level: Needs assistance Equipment used: 1 person hand held assist Transfers: Sit to/from Stand, Bed to chair/wheelchair/BSC Sit to Stand: Min assist   Step pivot transfers: Min assist       General transfer comment: assist for power up, rise, steady, and pivotal steps to/from [facility]. Stand x2, from EOB and BSC.    Ambulation/Gait               General Gait Details: nt - pt declined due to fatigue   Stairs             Wheelchair Mobility     Tilt Bed    Modified Rankin (Stroke Patients Only)       Balance Overall balance assessment: Needs assistance Sitting-balance support: No upper extremity supported, Feet supported Sitting balance-Leahy Scale: Fair Sitting balance - Comments: posterior lean Postural control: Posterior lean Standing balance support: Single extremity supported, During functional activity Standing balance-Leahy Scale: Poor                              Communication Communication Communication: Impaired Factors Affecting Communication: Difficulty expressing self (increased time to respond)  Cognition Arousal: Alert Behavior During Therapy: Flat affect  PT - Cognitive impairments: Awareness, Memory, Attention, Problem solving, Safety/Judgement, Initiation                       PT - Cognition Comments: very flat, oriented to self, location, and admission. Pt also oriented to birthday  coming up this week. Following commands: Impaired Following commands impaired: Follows one step commands with increased time    Cueing Cueing Techniques: Verbal cues, Visual cues  Exercises      General Comments        Pertinent Vitals/Pain Pain Assessment Pain Assessment: Faces Faces Pain Scale: No hurt Pain Intervention(s): Limited activity within patient's tolerance, Monitored during session, Repositioned    Home Living                          Prior Function            PT Goals (current goals can now be found in the care plan section) Acute Rehab PT Goals Patient Stated Goal: none stated PT Goal Formulation: With patient Time For Goal Achievement: 08/19/24 Potential to Achieve Goals: Fair Progress towards PT goals: Progressing toward goals    Frequency    Min 2X/week      PT Plan      Co-evaluation              AM-PAC PT 6 Clicks Mobility   Outcome Measure  Help needed turning from your back to your side while in a flat bed without using bedrails?: A Little Help needed moving from lying on your back to sitting on the side of a flat bed without using bedrails?: A Little Help needed moving to and from a bed to a chair (including a wheelchair)?: A Lot Help needed standing up from a chair using your arms (e.g., wheelchair or bedside chair)?: A Lot Help needed to walk in hospital room?: A Lot Help needed climbing 3-5 steps with a railing? : Total 6 Click Score: 13    End of Session   Activity Tolerance: Patient tolerated treatment well Patient left: in bed;with call bell/phone within reach;with bed alarm set;Other (comment) (seizure pads donned) Nurse Communication: Mobility status;Other (comment) (had BM on [facility]) PT Visit Diagnosis: Other symptoms and signs involving the nervous system (R29.898);Muscle weakness (generalized) (M62.81)     Time: 8447-8394 (arrived to room 1541-1546 to start session, pt needing to urinate so PT stepped  out until 1552)  PT Time Calculation (min) (ACUTE ONLY): 18 min  Charges:    $Therapeutic Activity: 8-22 mins PT General Charges $$ ACUTE PT VISIT: 1 Visit                     Ernest Romero, PT Ernest Romero Acute Rehabilitation Services Secure Chat Preferred  Office [phone]    Ernest Romero 08/05/2024, 4:24 PM

## 2024-08-05 NOTE — TOC Progression Note (Signed)
 Transition of Care [facility]) - Progression Note    Patient Details  Name: Ernest Romero MRN: [id number] Date of Birth: 1966/07/08  Transition of Care [facility]) CM/SW Contact  Inocente GORMAN Kindle, LCSW Phone Number: 08/05/2024, 10:28 AM  Clinical Narrative:    Inpatient Care Management continuing to follow for needs. Patient still with Cortrak.    Expected Discharge Plan: Skilled Nursing Facility Barriers to Discharge: Continued Medical Work up               Expected Discharge Plan and Services   Discharge Planning Services: CM Consult                                           Social Drivers of Health (SDOH) Interventions SDOH Screenings   Food Insecurity: Patient Unable To Answer (07/16/2024)  Housing: Unknown (07/16/2024)  Transportation Needs: Patient Unable To Answer (07/16/2024)  Utilities: Patient Unable To Answer (07/16/2024)  Alcohol Screen: Low Risk  (08/30/2022)  Depression (PHQ2-9): Medium Risk (08/30/2022)  Tobacco Use: Medium Risk (08/01/2024)    Readmission Risk Interventions    07/16/2024   12:23 PM  Readmission Risk Prevention Plan  Transportation Screening Complete  Medication Review (RN Care Manager) Complete  PCP or Specialist appointment within 3-5 days of discharge Complete  HRI or Home Care Consult Complete  SW Recovery Care/Counseling Consult Complete  Palliative Care Screening Not Applicable  Skilled Nursing Facility Not Applicable

## 2024-08-05 NOTE — Assessment & Plan Note (Signed)
 Psychiatry consulted.  Lithium  and Latuda  have been stopped - Continue Klonopin 

## 2024-08-05 NOTE — Progress Notes (Signed)
   08/05/24 1334  Vitals  Temp 97.7 F (36.5 C)  Temp Source Oral  BP 132/71  MAP (mmHg) 87  BP Location Right Arm  BP Method Automatic  Patient Position (if appropriate) Lying  Pulse Rate 71  Pulse Rate Source Monitor  Resp 19  Level of Consciousness  Level of Consciousness Alert  MEWS COLOR  MEWS Score Color Green  Oxygen Therapy  SpO2 96 %  O2 Device Room Air  Pain Assessment  Pain Scale 0-10  Pain Score 0  Complaints & Interventions  Neuro symptoms relieved by Rest;Other (Comment)  PCA/Epidural/Spinal Assessment  Respiratory Pattern Regular;Unlabored;Symmetrical  Glasgow Coma Scale  Eye Opening 4  Best Verbal Response (NON-intubated) 5  Best Motor Response 6  Glasgow Coma Scale Score 15  MEWS Score  MEWS Temp 0  MEWS Systolic 0  MEWS Pulse 0  MEWS RR 0  MEWS LOC 0  MEWS Score 0   Pt transferred to 4N03 from 4N ICU with all personal belongings. Pt A&Ox4, MAEx4, alert and verbally responsive. Skin assessed with second RN per hospital policy with no pressure injuries noted. Head incision clean and dry with no drainage. Cortrak irrigated. Tele applied and CCMD called. Soft waist restraint discontinued. Pt oriented to unit, room, and call light system. Pt in bed with alarm on, call bell within reach, and bed in lowest position.

## 2024-08-05 NOTE — Progress Notes (Signed)
 Speech Language Pathology Treatment: Dysphagia  Patient Details Name: Ernest Romero MRN: [id number] DOB: [birth year] Today's Date: 08/05/2024 Time: 8655-8645 SLP Time Calculation (min) (ACUTE ONLY): 10 min  Assessment / Plan / Recommendation Clinical Impression  Pt is oriented x4 and participated in a verbal reasoning task given Min cueing. He demonstrated safety awareness when presented with hypothetical situations, though question if this carries over into functional tasks. He fed himself regular solids, clearing his oral cavity completely. He reports some difficulty chewing foods such as a hamburger but has been able to choose softer solids since then. No s/s of aspiration were observed with consecutive straw sips of thin liquids. Discussed potential to advance back to regular solids but pt states he would prefer to continue having bite-sized solids. Pt is more consistently alert and no longer needs SLP f/u for dysphagia but will continue cognitive-linguistic interventions.    HPI HPI: 58 yo male with history of alcohol use disorder, psychiatric disorder, and HTN, who presents 10/14 from [facility], where he was admitted 9/28 after an unresponsive episode in jail. Found to have large SDH s/p L frontoparietal craniotomy with evacuation of SDH and drain placement 9/28 (removed 9/29). Followed by SLP for cognitive-linguistic goals and on a regular diet prior to transfer. Transferred to [facility] for further management of craniotomy and flab. S/p cranioplasty with bone flap replacement 10/27. Change in status thought to be related to Klonopin  administration 10/28, repeat CTH shows significant increase in size of mixed attenuation subdural fluid collection underlying the L tempoparietal craniotomy site, now measuring approximately 15 mm in thickness, with associated mild mass effect and slight rightward midline shift s/p L frontal burr hole for evacuation 10/30.      SLP Plan  Continue with current plan of care           Recommendations  Diet recommendations: Dysphagia 3 (mechanical soft);Thin liquid Liquids provided via: Cup;Straw Medication Administration: Whole meds with liquid Supervision: Staff to assist with self feeding;Full supervision/cueing for compensatory strategies Compensations: Minimize environmental distractions;Slow rate;Small sips/bites Postural Changes and/or Swallow Maneuvers: Seated upright 90 degrees                  Oral care BID   Frequent or constant Supervision/Assistance Cognitive communication deficit (R41.841);Dysphagia, unspecified (R13.10)     Continue with current plan of care     Damien Blumenthal, M.A., CCC-SLP Speech Language Pathology, Acute Rehabilitation Services  Secure Chat preferred [phone]   08/05/2024, 2:30 PM

## 2024-08-05 NOTE — Assessment & Plan Note (Signed)
 Patient's support counselor Larnell [name] has been visiting intermittently. [phone]).  Per information available to medical team, he does not have kids, his spouse passed away. Has sister, estranged.

## 2024-08-05 NOTE — Assessment & Plan Note (Addendum)
 Due to SDH.  Compounded by Bipolar with mania, and earlier by withdrawal. - Standard delirium precautions

## 2024-08-05 NOTE — Progress Notes (Signed)
 Patient very agitated and restless throughout the night. Neuro status is unchanged. Will pass along to dayshift.
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
## 2024-08-06 LAB — COMPREHENSIVE METABOLIC PANEL WITH GFR
ALT: 118 U/L — ABNORMAL HIGH (ref 0–44)
AST: 92 U/L — ABNORMAL HIGH (ref 15–41)
Albumin: 3.8 g/dL (ref 3.5–5.0)
Alkaline Phosphatase: 82 U/L (ref 38–126)
Anion gap: 10 (ref 5–15)
BUN: 16 mg/dL (ref 6–20)
CO2: 22 mmol/L (ref 22–32)
Calcium: 9.2 mg/dL (ref 8.9–10.3)
Chloride: 102 mmol/L (ref 98–111)
Creatinine, Ser: 0.92 mg/dL (ref 0.61–1.24)
GFR, Estimated: >60 mL/min (ref >60)
Glucose, Bld: 96 mg/dL (ref 70–99)
Potassium: 3.8 mmol/L (ref 3.5–5.1)
Sodium: 134 mmol/L — ABNORMAL LOW (ref 135–145)
Total Bilirubin: 0.4 mg/dL (ref 0.0–1.2)
Total Protein: 7.1 g/dL (ref 6.5–8.1)

## 2024-08-06 LAB — GLUCOSE, CAPILLARY
Glucose-Capillary: 105 mg/dL — ABNORMAL HIGH (ref 70–99)
Glucose-Capillary: 106 mg/dL — ABNORMAL HIGH (ref 70–99)
Glucose-Capillary: 122 mg/dL — ABNORMAL HIGH (ref 70–99)
Glucose-Capillary: 139 mg/dL — ABNORMAL HIGH (ref 70–99)
Glucose-Capillary: 148 mg/dL — ABNORMAL HIGH (ref 70–99)
Glucose-Capillary: 162 mg/dL — ABNORMAL HIGH (ref 70–99)

## 2024-08-06 LAB — CBC
HCT: 39.4 % (ref 39.0–52.0)
Hemoglobin: 13.6 g/dL (ref 13.0–17.0)
MCH: 30 pg (ref 26.0–34.0)
MCHC: 34.5 g/dL (ref 30.0–36.0)
MCV: 87 fL (ref 80.0–100.0)
Platelets: 338 K/uL (ref 150–400)
RBC: 4.53 MIL/uL (ref 4.22–5.81)
RDW: 12.6 % (ref 11.5–15.5)
WBC: 7.5 K/uL (ref 4.0–10.5)
nRBC: 0 % (ref 0.0–0.2)

## 2024-08-06 MED ORDER — FOLIC ACID 1 MG PO TABS: 1.0000 mg | ORAL_TABLET | Freq: Every day | ORAL | Status: DC

## 2024-08-06 MED ORDER — NICOTINE 14 MG/24HR TD PT24: 14.0000 mg | MEDICATED_PATCH | Freq: Every day | TRANSDERMAL | Status: DC

## 2024-08-06 MED ORDER — HALOPERIDOL LACTATE 5 MG/ML IJ SOLN
5.0000 mg | Freq: Four times a day (QID) | INTRAMUSCULAR | Status: DC | PRN
  Administered 2024-08-06 – 2024-08-14 (×8): 5 mg via INTRAVENOUS
  Filled 2024-08-06 (×8): qty 1

## 2024-08-06 MED ORDER — LURASIDONE HCL 40 MG PO TABS
40.0000 mg | ORAL_TABLET | Freq: Every day | ORAL | Status: DC
  Administered 2024-08-06 – 2024-08-15 (×10): 40 mg via ORAL
  Filled 2024-08-06 (×13): qty 1

## 2024-08-06 MED ORDER — THIAMINE MONONITRATE 100 MG PO TABS: 100.0000 mg | ORAL_TABLET | Freq: Every day | ORAL | Status: DC

## 2024-08-06 NOTE — Progress Notes (Signed)
 Occupational Therapy Treatment Patient Details Name: Ernest Romero MRN: [id number] DOB: 12-08-1965 Today's Date: 08/06/2024   History of present illness Pt is a 58 y.o. male presenting 10/14 from [facility] where he was admitted 9/28 for unresponsive episode in the jail. 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 On CIWA protocol. Transferred to [facility] for further management of craniotomy and flap. 10/27 s/p cranioplasty with bone flap replacement. 10/28 Change in status thought to be related to Klonopin  administration 10/28, repeat CTH shows significant increase in size of mixed attenuation subdural fluid collection underlying the L tempoparietal craniotomy site, now measuring approximately 15 mm in thickness, with associated mild mass effect and slight rightward midline shift. OR on 10/30 for SDH evacuation. PMH alcohol use disorder, psychiatric disorder, HTN   OT comments  Pt supine in bed and agreeable to OT session.  Pt oriented and following simple commands with increased time, does report not being able to sleep last night.  He is able to doff/don new socks in bed with setup assist.  Transitioned to EOB with min assist, at EOB intermittent awareness of posterior lean and self correcting with verbal cueing only.  He requires min assist for UB bathing and donning new gown.  Fatigues easily and requests to return back to bed.  Able to squat pivot towards HOB with min assist before laying back down.  RN present upon exit.  Continue to recommend <3hrs /day inpatient setting at dc.       If plan is discharge home, recommend the following:  Supervision due to cognitive status;Direct supervision/assist for medications management;Assistance with cooking/housework;Direct supervision/assist for financial management;Assist for transportation;Assistance with feeding;A lot of help with walking and/or transfers;A lot of help with  bathing/dressing/bathroom   Equipment Recommendations  Other (comment) (defer)    Recommendations for Other Services      Precautions / Restrictions Precautions Precautions: Fall Recall of Precautions/Restrictions: Impaired Precaution/Restrictions Comments: BP <160 Restrictions Weight Bearing Restrictions Per Provider Order: No       Mobility Bed Mobility Overal bed mobility: Needs Assistance Bed Mobility: Supine to Sit, Sit to Supine     Supine to sit: Min assist, HOB elevated Sit to supine: Min assist, HOB elevated   General bed mobility comments: assist for initation and trunk support due to posterior bias    Transfers Overall transfer level: Needs assistance   Transfers: Bed to chair/wheelchair/BSC     Squat pivot transfers: Min assist       General transfer comment: simulated squat pivot towards HOB with min assist     Balance Overall balance assessment: Needs assistance Sitting-balance support: No upper extremity supported, Feet supported Sitting balance-Leahy Scale: Fair Sitting balance - Comments: pt with posterior bias, but some awareness to lean and correcting with verbal cueing Postural control: Posterior lean                                 ADL either performed or assessed with clinical judgement   ADL Overall ADL's : Needs assistance/impaired     Grooming: Wash/dry hands;Wash/dry face;Sitting;Contact guard assist Grooming Details (indicate cue type and reason): sitting EOB, intermittent posterior lean Upper Body Bathing: Minimal assistance;Sitting       Upper Body Dressing : Minimal assistance;Sitting Upper Body Dressing Details (indicate cue type and reason): EOB, changing gown Lower Body Dressing: Sitting/lateral leans;Bed level;Moderate assistance Lower Body  Dressing Details (indicate cue type and reason): able to doff and don socks from long sitting in bed, once EOB with posteiror lean and needing overall min assist for  safety. would likely need mod assist to fully don LB clothing. Toilet Transfer: Minimal Soil Scientist Details (indicate cue type and reason): simulated squat pivot towards HOB         Functional mobility during ADLs: Minimal assistance;Cueing for safety;Cueing for sequencing General ADL Comments: limited to EOB, pt fatigues easily (did not sleep overnight per RN and patient)    Extremity/Trunk Assessment Upper Extremity Assessment Upper Extremity Assessment: Generalized weakness (moving UEs more functionally today, able to engage in ADLs with BUEs.  Some tone noted at rest in R UE into extension.  Continue assessment)            Vision   Additional Comments: scanning to therapist on L and R side. appears functional but not formally assessed   Perception     Praxis     Communication Communication Communication: Impaired Factors Affecting Communication: Difficulty expressing self (increased time to repsond)   Cognition Arousal: Alert Behavior During Therapy: Flat affect, Impulsive Cognition: Cognition impaired     Awareness: Online awareness impaired   Attention impairment (select first level of impairment): Sustained attention Executive functioning impairment (select all impairments): Organization, Sequencing, Reasoning, Problem solving OT - Cognition Comments: pt oriented and following simple commands with increased time, impulsive but easily redirected. pt with improving awareness to deficits, as when sitting EOB reports i'm wanting to lean back now                 Following commands: Impaired Following commands impaired: Follows one step commands with increased time, Follows multi-step commands inconsistently      Cueing   Cueing Techniques: Verbal cues  Exercises      Shoulder Instructions       General Comments      Pertinent Vitals/ Pain       Pain Assessment Pain Assessment: Faces Faces Pain Scale: No hurt Pain  Intervention(s): Monitored during session, Repositioned, Limited activity within patient's tolerance  Home Living                                          Prior Functioning/Environment              Frequency  Min 2X/week        Progress Toward Goals  OT Goals(current goals can now be found in the care plan section)  Progress towards OT goals: Progressing toward goals  Acute Rehab OT Goals Patient Stated Goal: unable OT Goal Formulation: Patient unable to participate in goal setting Time For Goal Achievement: 08/14/24 Potential to Achieve Goals: Fair  Plan      Co-evaluation                 AM-PAC OT 6 Clicks Daily Activity     Outcome Measure   Help from another person eating meals?: A Little Help from another person taking care of personal grooming?: A Little Help from another person toileting, which includes using toliet, bedpan, or urinal?: A Lot Help from another person bathing (including washing, rinsing, drying)?: Total Help from another person to put on and taking off regular upper body clothing?: A Little Help from another person to put on and taking off regular lower body clothing?: A Lot  6 Click Score: 14    End of Session    OT Visit Diagnosis: Other abnormalities of gait and mobility (R26.89);Muscle weakness (generalized) (M62.81);Other symptoms and signs involving the nervous system (R29.898);Cognitive communication deficit (R41.841);Other symptoms and signs involving cognitive function   Activity Tolerance Patient limited by fatigue   Patient Left in bed;with call bell/phone within reach;with bed alarm set;with SCD's reapplied;with nursing/sitter in room   Nurse Communication Mobility status;Precautions        Time: 8896-8878 OT Time Calculation (min): 18 min  Charges: OT General Charges $OT Visit: 1 Visit OT Treatments $Self Care/Home Management : 8-22 mins  Etta NOVAK, OT Acute Rehabilitation  Services Office [phone] Secure Chat Preferred    Etta GORMAN Hope 08/06/2024, 12:30 PM
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
## 2024-08-06 NOTE — TOC Progression Note (Signed)
 Transition of Care (TOC) - Progression Note  Rayfield Gobble RN,BSN Inpatient Care Management Unit 4NP (Non Trauma)- RN Case Manager See Treatment Team for direct Phone #   Patient Details  Name: Ernest Romero MRN: [id number] Date of Birth: 01-03-1966  Transition of Care [facility]) CM/SW Contact  Gobble, Rayfield Hurst, RN Phone Number: 08/06/2024, 11:33 AM  Clinical Narrative:    Noted previous TOC team had reached out to [facility] liaison to have pt reviewed for potential LTACH- this CM reached out to [facility] liaison- DJ to follow up.  Per TC with DJ pt was reviewed however does not meet LTACH criteria and [facility] will not be pursuing for admit.  Pt is DTP as at this time there is no dispo plan in place. IP CM (inpatient care management) team will continue to follow.  Department supervisor Josefa Jes MSW is aware of situation.    Expected Discharge Plan: Skilled Nursing Facility Barriers to Discharge: Continued Medical Work up               Expected Discharge Plan and Services   Discharge Planning Services: CM Consult                                           Social Drivers of Health (SDOH) Interventions SDOH Screenings   Food Insecurity: Patient Unable To Answer (07/16/2024)  Housing: Unknown (07/16/2024)  Transportation Needs: Patient Unable To Answer (07/16/2024)  Utilities: Patient Unable To Answer (07/16/2024)  Alcohol Screen: Low Risk  (08/30/2022)  Depression (PHQ2-9): Medium Risk (08/30/2022)  Tobacco Use: Medium Risk (08/01/2024)    Readmission Risk Interventions    07/16/2024   12:23 PM  Readmission Risk Prevention Plan  Transportation Screening Complete  Medication Review (RN Care Manager) Complete  PCP or Specialist appointment within 3-5 days of discharge Complete  HRI or Home Care Consult Complete  SW Recovery Care/Counseling Consult Complete  Palliative Care Screening Not Applicable  Skilled Nursing Facility Not Applicable

## 2024-08-06 NOTE — Plan of Care (Signed)
   Problem: Education: Goal: Knowledge of General Education information will improve Description Including pain rating scale, medication(s)/side effects and non-pharmacologic comfort measures Outcome: Progressing
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
## 2024-08-06 NOTE — Plan of Care (Signed)
  Problem: Clinical Measurements: Goal: Ability to maintain clinical measurements within normal limits will improve Outcome: Progressing Goal: Will remain free from infection Outcome: Progressing Goal: Diagnostic test results will improve Outcome: Progressing Goal: Respiratory complications will improve Outcome: Progressing Goal: Cardiovascular complication will be avoided Outcome: Progressing   Problem: Activity: Goal: Risk for activity intolerance will decrease Outcome: Progressing   Problem: Nutrition: Goal: Adequate nutrition will be maintained Outcome: Progressing   Problem: Coping: Goal: Level of anxiety will decrease Outcome: Progressing   Problem: Elimination: Goal: Will not experience complications related to bowel motility Outcome: Progressing Goal: Will not experience complications related to urinary retention Outcome: Progressing   Problem: Pain Managment: Goal: General experience of comfort will improve and/or be controlled Outcome: Progressing   Problem: Safety: Goal: Ability to remain free from injury will improve Outcome: Progressing   Problem: Skin Integrity: Goal: Risk for impaired skin integrity will decrease Outcome: Progressing   Problem: Education: Goal: Knowledge of the prescribed therapeutic regimen will improve Outcome: Progressing   Problem: Clinical Measurements: Goal: Usual level of consciousness will be regained or maintained. Outcome: Progressing Goal: Neurologic status will improve Outcome: Progressing Goal: Ability to maintain intracranial pressure will improve Outcome: Progressing   Problem: Skin Integrity: Goal: Demonstration of wound healing without infection will improve Outcome: Progressing

## 2024-08-07 LAB — GLUCOSE, CAPILLARY: Glucose-Capillary: 146 mg/dL — ABNORMAL HIGH (ref 70–99)

## 2024-08-07 NOTE — Progress Notes (Signed)
  Progress Note   Patient: Ernest Romero FMW:[id number] DOB: 12-02-1965 DOA: 07/15/2024     23 DOS: the patient was seen and examined on 08/07/2024 at 9:15AM      Brief hospital course: 58 y.o. M with history bipolar d/o, alcohol abuse, sent from jail for being found unresponsive.  In the ER at [facility], found to have large subdural hematoma.           Assessment and Plan: * Subdural hematoma (HCC) S/p craniotomy 9/28 by Dr. Loa poor barr S/p skull flap reconstruction 10/27 by Dr. Rosslyn S/p burr hole evacuation on 10/30 by Dr. Rosslyn See summary from yesterday.  Progressing well.   - Post-operative care per Neurosurgery - PT/OT  Bipolar disorder (HCC) -Continue Latuda  and Klonopin    Unable to make decisions about medical treatment due to impaired mental capacity Patient's support counselor Larnell [name] has been visiting intermittently. [phone]).  Per information available to medical team, he does not have kids, his spouse passed away. Has sister, estranged.   Alcohol withdrawal (HCC) No further withdrawal  Essential hypertension Blood pressure normal - Continue amlodipine   Acute metabolic encephalopathy Slowly imrpoving, mostly oriented when awake.             Subjective: Patient sleeping, rouses sluggishly, up to commode and eating.  Able to use urinal.  No fever.  Transferring to neuro floor, tele.     Physical Exam: BP 112/82 (BP Location: Right Arm)   Pulse 72   Temp 98.1 F (36.7 C) (Oral)   Resp 18   Ht 5' 10 (1.778 m)   Wt 66 kg   SpO2 95%   BMI 20.88 kg/m   Adult male, sleeping, arouses, no Placey pallor telemetry in place RRR, no murmurs, no peripheral edema Respiratory rate normal, lungs clear without rales or wheezes Incision appears clean dry and intact, opens eyes, speech slow but oriented, strength symmetric, overall weak    Disposition: Status is: Inpatient         Author: Lonni SHAUNNA Dalton, MD 08/07/2024 4:16  PM  For on call review [email or web address].

## 2024-08-07 NOTE — Progress Notes (Signed)
 Report given to Adriana on 3 west. Pt transferred to unit by bed with all pt belongings to room 11.

## 2024-08-07 NOTE — Progress Notes (Signed)
 Physical Therapy Treatment Patient Details Name: Ernest Romero MRN: [id number] DOB: 1966-04-12 Today's Date: 08/07/2024   History of Present Illness Pt is a 58 y.o. male presenting 10/14 from [facility] where he was admitted 9/28 for unresponsive episode in the jail. 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 On CIWA protocol. Transferred to [facility] for further management of craniotomy and flap. 10/27 s/p cranioplasty with bone flap replacement. 10/28 Change in status thought to be related to Klonopin  administration 10/28, repeat CTH shows significant increase in size of mixed attenuation subdural fluid collection underlying the L tempoparietal craniotomy site, now measuring approximately 15 mm in thickness, with associated mild mass effect and slight rightward midline shift. OR on 10/30 for SDH evacuation. PMH alcohol use disorder, psychiatric disorder, HTN    PT Comments  Pt continues to fatigue quickly with activity. Stood bedside and took steps to [facility]. Need 2nd person to follow amb with chair due to quick fatigue.     If plan is discharge home, recommend the following: A lot of help with bathing/dressing/bathroom;Assistance with cooking/housework;Direct supervision/assist for medications management;Direct supervision/assist for financial management;Assist for transportation;Help with stairs or ramp for entrance;Supervision due to cognitive status;A little help with walking and/or transfers   Can travel by private vehicle     Yes  Equipment Recommendations  Wheelchair (measurements PT);Wheelchair cushion (measurements PT)    Recommendations for Other Services       Precautions / Restrictions Precautions Precautions: Fall Recall of Precautions/Restrictions: Impaired Precaution/Restrictions Comments: BP <160 Restrictions Weight Bearing Restrictions Per Provider Order: No     Mobility  Bed Mobility Overal bed  mobility: Needs Assistance Bed Mobility: Supine to Sit, Sit to Supine     Supine to sit: Min assist, HOB elevated Sit to supine: Min assist, HOB elevated   General bed mobility comments: Assist to elevate trunk into sitting    Transfers Overall transfer level: Needs assistance Equipment used: Rolling walker (2 wheels) Transfers: Sit to/from Stand Sit to Stand: Min assist           General transfer comment: Assist to power up and stabilize    Ambulation/Gait             Pre-gait activities: side stepped up to [facility]     Stairs             Wheelchair Mobility     Tilt Bed    Modified Rankin (Stroke Patients Only)       Balance Overall balance assessment: Needs assistance Sitting-balance support: No upper extremity supported, Feet supported Sitting balance-Leahy Scale: Fair Sitting balance - Comments: corrects posterior bias with cues Postural control: Posterior lean Standing balance support: Bilateral upper extremity supported Standing balance-Leahy Scale: Poor Standing balance comment: walker and CGA/min A                            Communication Communication Communication: Impaired Factors Affecting Communication: Difficulty expressing self  Cognition Arousal: Alert Behavior During Therapy: Flat affect, Impulsive   PT - Cognitive impairments: Awareness, Memory, Attention, Problem solving, Safety/Judgement, Initiation                   Rancho Levels of Cognitive Functioning Ranch[place] Mirant Scales of Cognitive Functioning: Confused, Inappropriate Non-Agitated: Maximal Assistance Rancho Mirant Scales of Cognitive Functioning: Confused, Inappropriate Non-Agitated: Maximal Assistance [V]   Following commands: Impaired Following commands  impaired: Follows one step commands with increased time, Follows multi-step commands inconsistently    Cueing Cueing Techniques: Verbal cues  Exercises      General Comments General  comments (skin integrity, edema, etc.): VSS on RA      Pertinent Vitals/Pain Pain Assessment Pain Assessment: No/denies pain    Home Living                          Prior Function            PT Goals (current goals can now be found in the care plan section) Acute Rehab PT Goals Patient Stated Goal: none stated Progress towards PT goals: Progressing toward goals    Frequency    Min 2X/week      PT Plan      Co-evaluation              AM-PAC PT 6 Clicks Mobility   Outcome Measure  Help needed turning from your back to your side while in a flat bed without using bedrails?: A Little Help needed moving from lying on your back to sitting on the side of a flat bed without using bedrails?: A Little Help needed moving to and from a bed to a chair (including a wheelchair)?: A Little Help needed standing up from a chair using your arms (e.g., wheelchair or bedside chair)?: A Little Help needed to walk in hospital room?: A Lot Help needed climbing 3-5 steps with a railing? : Total 6 Click Score: 15    End of Session Equipment Utilized During Treatment: Gait belt Activity Tolerance: Patient limited by fatigue Patient left: in bed;with call bell/phone within reach;with bed alarm set;Other (comment) (waist alarm on)   PT Visit Diagnosis: Other symptoms and signs involving the nervous system (R29.898);Muscle weakness (generalized) (M62.81)     Time: 8879-8864 PT Time Calculation (min) (ACUTE ONLY): 15 min  Charges:    $Therapeutic Activity: 8-22 mins PT General Charges $$ ACUTE PT VISIT: 1 Visit                     [facility] PT Acute Rehabilitation Services Office [phone]    Rodgers ORN [facility] 08/07/2024, 2:05 PM

## 2024-08-08 LAB — CBC
HCT: 39.1 % (ref 39.0–52.0)
Hemoglobin: 13.2 g/dL (ref 13.0–17.0)
MCH: 29.7 pg (ref 26.0–34.0)
MCHC: 33.8 g/dL (ref 30.0–36.0)
MCV: 88.1 fL (ref 80.0–100.0)
Platelets: 360 K/uL (ref 150–400)
RBC: 4.44 MIL/uL (ref 4.22–5.81)
RDW: 12.7 % (ref 11.5–15.5)
WBC: 10.6 K/uL — ABNORMAL HIGH (ref 4.0–10.5)
nRBC: 0 % (ref 0.0–0.2)

## 2024-08-08 LAB — BASIC METABOLIC PANEL WITH GFR
Anion gap: 11 (ref 5–15)
BUN: 15 mg/dL (ref 6–20)
CO2: 23 mmol/L (ref 22–32)
Calcium: 9 mg/dL (ref 8.9–10.3)
Chloride: 105 mmol/L (ref 98–111)
Creatinine, Ser: 0.9 mg/dL (ref 0.61–1.24)
GFR, Estimated: >60 mL/min (ref >60)
Glucose, Bld: 95 mg/dL (ref 70–99)
Potassium: 3.9 mmol/L (ref 3.5–5.1)
Sodium: 139 mmol/L (ref 135–145)

## 2024-08-08 MED ORDER — CLONAZEPAM 0.5 MG PO TABS
0.5000 mg | ORAL_TABLET | Freq: Every day | ORAL | Status: DC
  Administered 2024-08-08 – 2024-08-11 (×4): 0.5 mg via ORAL
  Filled 2024-08-08 (×4): qty 1

## 2024-08-08 MED ORDER — GABAPENTIN 100 MG PO CAPS
200.0000 mg | ORAL_CAPSULE | Freq: Three times a day (TID) | ORAL | Status: AC
  Administered 2024-08-09 – 2024-11-08 (×272): 200 mg via ORAL
  Filled 2024-08-08 (×254): qty 2

## 2024-08-08 MED ORDER — CLONAZEPAM 0.5 MG PO TABS: 0.5000 mg | ORAL_TABLET | Freq: Every day | ORAL | Status: DC

## 2024-08-08 MED ORDER — LORAZEPAM 2 MG/ML IJ SOLN
0.5000 mg | Freq: Once | INTRAMUSCULAR | Status: AC | PRN
  Administered 2024-08-08: 0.5 mg via INTRAVENOUS
  Filled 2024-08-08: qty 1

## 2024-08-08 MED ORDER — IBUPROFEN 200 MG PO TABS
600.0000 mg | ORAL_TABLET | Freq: Once | ORAL | Status: AC
  Administered 2024-08-08: 600 mg via ORAL
  Filled 2024-08-08: qty 3
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
## 2024-08-08 NOTE — Progress Notes (Signed)
 SLP Cancellation Note  Patient Details Name: Ernest Romero MRN: [id number] DOB: 08-20-1966   Cancelled treatment:       Reason Eval/Treat Not Completed: Patient at procedure or test/unavailable; attempted x2; ST will continue efforts as schedule allows for cognitive tx.   Pat Elese Rane,M.S.,CCC-SLP 08/08/2024, 12:54 PM

## 2024-08-08 NOTE — Anesthesia Postprocedure Evaluation (Signed)
 Anesthesia Post Note  Patient: Ernest Romero  Procedure(s) Performed: CREATION, CRANIAL BURR HOLE WITH EVACUATION OF HEMATOMA     Patient location during evaluation: PACU Anesthesia Type: General Level of consciousness: obtunded/minimal responses Pain management: pain level controlled Vital Signs Assessment: post-procedure vital signs reviewed and stable Respiratory status: spontaneous breathing, nonlabored ventilation, respiratory function stable and patient connected to nasal cannula oxygen Cardiovascular status: blood pressure returned to baseline and stable Postop Assessment: no apparent nausea or vomiting Anesthetic complications: no   No notable events documented.               Christena Sunderlin

## 2024-08-08 NOTE — TOC Progression Note (Signed)
 Transition of Care [facility]) - Progression Note    Patient Details  Name: Ernest Romero MRN: [id number] Date of Birth: March 25, 1966  Transition of Care [facility]) CM/SW Contact  Almarie CHRISTELLA Goodie, [place] Phone Number: 08/08/2024, 11:30 AM  Clinical Narrative:   CSW following, patient transferred to new unit. CSW met with patient's peer support specialist, Joe, at bedside who was asking for an update, with Renetta, case production designer, theatre/television/film with [employer], on the phone. CSW answered questions about patient awaiting a guardian and needing placement in a SNF. Joe said he disagreed with the recommendation for a guardian, he said that he believed that the patient could manage himself. Joe wanted CSW to send that to the doctor, and CSW relayed message to MD. Per Larnell, he and Ileana are working on finding the patient housing once he completes rehab, as he is currently homeless.  Joe asked about [facility], and CSW explained that patient does not meet criteria for LTACH anymore, and he will need SNF. Joe indicated understanding. Joe said he has worked with the patient for over 5 years and is the only support that the patient has, but he will continue to work with him no matter what because he doesn't have anyone else.   CSW attempted to reach [employer] with [facility] APS [phone]) and left a voicemail to ask about status of guardianship application. CSW to follow.    Expected Discharge Plan: Skilled Nursing Facility Barriers to Discharge: Continued Medical Work up               Expected Discharge Plan and Services   Discharge Planning Services: CM Consult                                           Social Drivers of Health (SDOH) Interventions SDOH Screenings   Food Insecurity: Patient Unable To Answer (07/16/2024)  Housing: Unknown (07/16/2024)  Transportation Needs: Patient Unable To Answer (07/16/2024)  Utilities: Patient Unable To Answer (07/16/2024)  Alcohol Screen: Low Risk   (08/30/2022)  Depression (PHQ2-9): Medium Risk (08/30/2022)  Tobacco Use: Medium Risk (08/01/2024)    Readmission Risk Interventions    07/16/2024   12:23 PM  Readmission Risk Prevention Plan  Transportation Screening Complete  Medication Review (RN Care Manager) Complete  PCP or Specialist appointment within 3-5 days of discharge Complete  HRI or Home Care Consult Complete  SW Recovery Care/Counseling Consult Complete  Palliative Care Screening Not Applicable  Skilled Nursing Facility Not Applicable
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
## 2024-08-09 ENCOUNTER — Inpatient Hospital Stay (HOSPITAL_COMMUNITY): Payer: MEDICAID

## 2024-08-09 LAB — COMPREHENSIVE METABOLIC PANEL WITH GFR
ALT: 77 U/L — ABNORMAL HIGH (ref 0–44)
AST: 38 U/L (ref 15–41)
Albumin: 3.5 g/dL (ref 3.5–5.0)
Alkaline Phosphatase: 75 U/L (ref 38–126)
Anion gap: 11 (ref 5–15)
BUN: 13 mg/dL (ref 6–20)
CO2: 23 mmol/L (ref 22–32)
Calcium: 9.1 mg/dL (ref 8.9–10.3)
Chloride: 103 mmol/L (ref 98–111)
Creatinine, Ser: 0.83 mg/dL (ref 0.61–1.24)
GFR, Estimated: >60 mL/min (ref >60)
Glucose, Bld: 99 mg/dL (ref 70–99)
Potassium: 3.7 mmol/L (ref 3.5–5.1)
Sodium: 137 mmol/L (ref 135–145)
Total Bilirubin: 0.4 mg/dL (ref 0.0–1.2)
Total Protein: 6.4 g/dL — ABNORMAL LOW (ref 6.5–8.1)

## 2024-08-09 LAB — CBC
HCT: 38.6 % — ABNORMAL LOW (ref 39.0–52.0)
Hemoglobin: 12.9 g/dL — ABNORMAL LOW (ref 13.0–17.0)
MCH: 29.4 pg (ref 26.0–34.0)
MCHC: 33.4 g/dL (ref 30.0–36.0)
MCV: 87.9 fL (ref 80.0–100.0)
Platelets: 344 K/uL (ref 150–400)
RBC: 4.39 MIL/uL (ref 4.22–5.81)
RDW: 12.6 % (ref 11.5–15.5)
WBC: 9.3 K/uL (ref 4.0–10.5)
nRBC: 0 % (ref 0.0–0.2)

## 2024-08-09 NOTE — Progress Notes (Signed)
 Physical Therapy Treatment Patient Details Name: Ernest Romero MRN: [id number] DOB: 02/09/1966 Today's Date: 08/09/2024   History of Present Illness Pt is a 58 y.o. male presenting 10/14 from [place] where he was admitted 9/28 for unresponsive episode in the jail. 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 On CIWA protocol. Transferred to [facility] for further management of craniotomy and flap. 10/27 s/p cranioplasty with bone flap replacement. 10/28 Change in status thought to be related to Klonopin  administration 10/28, repeat CTH shows significant increase in size of mixed attenuation subdural fluid collection underlying the L tempoparietal craniotomy site, now measuring approximately 15 mm in thickness, with associated mild mass effect and slight rightward midline shift. OR on 10/30 for SDH evacuation. PMH alcohol use disorder, psychiatric disorder, HTN    PT Comments  Pt received in supine and agreeable to session. Pt able to sit to EOB and stand with min A for steadying. Pt demonstrates limited activity tolerance requiring encouragement to progress mobility. Pt able to take a few lateral steps to [facility], but declines further ambulation due to fatigue. Pt able to tolerate seated BLE exercises before returning to supine. Pt continues to benefit from PT services to progress toward functional mobility goals.     If plan is discharge home, recommend the following: A lot of help with bathing/dressing/bathroom;Assistance with cooking/housework;Direct supervision/assist for medications management;Direct supervision/assist for financial management;Assist for transportation;Help with stairs or ramp for entrance;Supervision due to cognitive status;A little help with walking and/or transfers   Can travel by private vehicle     Yes  Equipment Recommendations  Wheelchair (measurements PT);Wheelchair cushion (measurements PT)     Recommendations for Other Services       Precautions / Restrictions Precautions Precautions: Fall Recall of Precautions/Restrictions: Impaired Precaution/Restrictions Comments: SBP <160, helmet (pt to wear when OOB per RN) Restrictions Weight Bearing Restrictions Per Provider Order: No     Mobility  Bed Mobility Overal bed mobility: Needs Assistance Bed Mobility: Supine to Sit, Sit to Supine     Supine to sit: Min assist, HOB elevated, Used rails Sit to supine: Contact guard assist, HOB elevated   General bed mobility comments: increased time and encouragement to complete without assist. Min A for balance due to posterior bias during scooting    Transfers Overall transfer level: Needs assistance Equipment used: Rolling walker (2 wheels) Transfers: Sit to/from Stand Sit to Stand: Min assist           General transfer comment: Min A for anterior weight shift. Increased time for rise. Pt able to take a few small lateral steps towards [facility] with assist for RW management and cues    Ambulation/Gait         Gait velocity: reduced     General Gait Details: nt - pt declined due to fatigue   Stairs             Wheelchair Mobility     Tilt Bed    Modified Rankin (Stroke Patients Only)       Balance Overall balance assessment: Needs assistance Sitting-balance support: No upper extremity supported, Single extremity supported, Feet supported Sitting balance-Leahy Scale: Poor Sitting balance - Comments: CGA due to posterior bias, but no LOB   Standing balance support: Bilateral upper extremity supported, Reliant on assistive device for balance Standing balance-Leahy Scale: Poor Standing balance comment: reliant on RW support  Communication Communication Communication: Impaired Factors Affecting Communication: Difficulty expressing self  Cognition Arousal: Alert Behavior During Therapy: Flat affect   PT -  Cognitive impairments: Awareness, Memory, Attention, Problem solving, Safety/Judgement, Initiation                   Rancho Levels of Cognitive Functioning Rancho Los Amigos Scales of Cognitive Functioning: Confused, Inappropriate Non-Agitated: Maximal Assistance Rancho Mirant Scales of Cognitive Functioning: Confused, Inappropriate Non-Agitated: Maximal Assistance [V]   Following commands: Impaired Following commands impaired: Follows one step commands with increased time    Cueing Cueing Techniques: Verbal cues, Visual cues  Exercises General Exercises - Lower Extremity Long Arc Quad: AROM, Seated, Both, 5 reps Hip Flexion/Marching: AROM, Seated, Both, 5 reps    General Comments        Pertinent Vitals/Pain Pain Assessment Pain Assessment: Faces Faces Pain Scale: Hurts little more Pain Location: head Pain Descriptors / Indicators: Discomfort Pain Intervention(s): Limited activity within patient's tolerance, Monitored during session, Repositioned     PT Goals (current goals can now be found in the care plan section) Acute Rehab PT Goals Patient Stated Goal: none stated PT Goal Formulation: With patient Time For Goal Achievement: 08/19/24 Progress towards PT goals: Progressing toward goals    Frequency    Min 2X/week       AM-PAC PT 6 Clicks Mobility   Outcome Measure  Help needed turning from your back to your side while in a flat bed without using bedrails?: A Little Help needed moving from lying on your back to sitting on the side of a flat bed without using bedrails?: A Little Help needed moving to and from a bed to a chair (including a wheelchair)?: A Little Help needed standing up from a chair using your arms (e.g., wheelchair or bedside chair)?: A Little Help needed to walk in hospital room?: A Lot Help needed climbing 3-5 steps with a railing? : Total 6 Click Score: 15    End of Session Equipment Utilized During Treatment:   (helmet) Activity Tolerance: Patient limited by fatigue Patient left: in bed;with call bell/phone within reach;with bed alarm set Nurse Communication: Mobility status PT Visit Diagnosis: Other symptoms and signs involving the nervous system (R29.898);Muscle weakness (generalized) (M62.81)     Time: 9140-9089 PT Time Calculation (min) (ACUTE ONLY): 11 min  Charges:    $Therapeutic Activity: 8-22 mins PT General Charges $$ ACUTE PT VISIT: 1 Visit                     Darryle George, PTA Acute Rehabilitation Services Secure Chat Preferred  Office:([phone]    Darryle George 08/09/2024, 9:39 AM

## 2024-08-09 NOTE — Plan of Care (Signed)
   Problem: Clinical Measurements: Goal: Ability to maintain clinical measurements within normal limits will improve Outcome: Progressing Goal: Will remain free from infection Outcome: Progressing Goal: Diagnostic test results will improve Outcome: Progressing Goal: Respiratory complications will improve Outcome: Progressing Goal: Cardiovascular complication will be avoided Outcome: Progressing   Problem: Coping: Goal: Level of anxiety will decrease Outcome: Progressing
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
## 2024-08-10 LAB — RENAL FUNCTION PANEL
Albumin: 3.5 g/dL (ref 3.5–5.0)
Anion gap: 11 (ref 5–15)
BUN: 14 mg/dL (ref 6–20)
CO2: 25 mmol/L (ref 22–32)
Calcium: 9.3 mg/dL (ref 8.9–10.3)
Chloride: 102 mmol/L (ref 98–111)
Creatinine, Ser: 0.97 mg/dL (ref 0.61–1.24)
GFR, Estimated: >60 mL/min (ref >60)
Glucose, Bld: 110 mg/dL — ABNORMAL HIGH (ref 70–99)
Phosphorus: 4.5 mg/dL (ref 2.5–4.6)
Potassium: 4.2 mmol/L (ref 3.5–5.1)
Sodium: 138 mmol/L (ref 135–145)

## 2024-08-10 MED ORDER — FOLIC ACID 1 MG PO TABS
1.0000 mg | ORAL_TABLET | Freq: Every day | ORAL | Status: AC
  Administered 2024-08-11 – 2024-11-08 (×89): 1 mg via ORAL
  Filled 2024-08-10 (×82): qty 1

## 2024-08-10 MED ORDER — ADULT MULTIVITAMIN W/MINERALS CH
1.0000 | ORAL_TABLET | Freq: Every day | ORAL | Status: AC
  Administered 2024-08-11 – 2024-11-08 (×87): 1 via ORAL
  Filled 2024-08-10 (×80): qty 1

## 2024-08-10 MED ORDER — AMLODIPINE BESYLATE 10 MG PO TABS
10.0000 mg | ORAL_TABLET | Freq: Every day | ORAL | Status: DC
  Administered 2024-08-11 – 2024-08-23 (×10): 10 mg via ORAL
  Filled 2024-08-10: qty 2
  Filled 2024-08-10 (×11): qty 1

## 2024-08-10 MED ORDER — LOSARTAN POTASSIUM 50 MG PO TABS
50.0000 mg | ORAL_TABLET | Freq: Every day | ORAL | Status: DC
  Administered 2024-08-10 – 2024-08-23 (×11): 50 mg via ORAL
  Filled 2024-08-10 (×13): qty 1

## 2024-08-10 MED ORDER — MELATONIN 5 MG PO TABS
10.0000 mg | ORAL_TABLET | Freq: Every evening | ORAL | Status: AC | PRN
  Administered 2024-08-10 – 2024-11-08 (×76): 10 mg via ORAL
  Filled 2024-08-10 (×71): qty 2

## 2024-08-10 NOTE — Progress Notes (Signed)
 I have reviewed the patient's latest CT: this shows that he has again collected fluid under his bone flap. This can have two reasons:  1: he has hydrocephalus and this is the path of least resistance. I don't believe this is the case as prior to the bone flap replacement, he did not have this and it is not likely that he has developed hydrocephalus.   2: the bone flap does not cover his skull thickness and therefore there is an inherent subdural space which collects blood and like a subdural hematoma, gradually grows. I think this is most likely. I can keep evacuating these but as long as that space is there, he will keep collecting fluid/ hematoma.  You can shunt these fluid collections but then he will have a shunt that will need to be dealt with and all the problems that can arise from that. Ventricular shunting can exacerbate the subdural space by collapsing the ventricle making the problem worse. Then, the shunt will have to be ligated, the subdural evacuated with removal of the bone flap to eliminate the dead space followed by a surgery to replace the bone flap essentially putting us  back where we are today after having done multiple surgeries.   After considering all options, I will remove his bone flap. He will have to live without a bone flap and thus a helmet for protection. He is scheduled for surgery on the coming Monday.
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
## 2024-08-11 LAB — TYPE AND SCREEN
ABO/RH(D): O NEG
Antibody Screen: NEGATIVE

## 2024-08-11 NOTE — Plan of Care (Signed)
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
## 2024-08-11 NOTE — Progress Notes (Signed)
 Patient very tearful regarding what happened to him & this hospitalization.  He remains A/Ox4 but very forgetful & impulsive. He is not compliant with safety measures and takes off helmet immediatly after it is applied.  Pain has been addressed, repositioning has been offered, & snacks have been given. Haldol  given & pt ultimately placed in soft belt restraint to ensure safety.

## 2024-08-12 ENCOUNTER — Encounter (HOSPITAL_COMMUNITY): Payer: Self-pay | Source: Home / Self Care | Attending: Family Medicine

## 2024-08-12 ENCOUNTER — Encounter (HOSPITAL_COMMUNITY): Payer: Self-pay | Admitting: Pulmonary Disease

## 2024-08-12 ENCOUNTER — Inpatient Hospital Stay (HOSPITAL_COMMUNITY): Payer: MEDICAID | Admitting: Certified Registered Nurse Anesthetist

## 2024-08-12 ENCOUNTER — Other Ambulatory Visit: Payer: Self-pay

## 2024-08-12 DIAGNOSIS — Z87891 Personal history of nicotine dependence: Secondary | ICD-10-CM

## 2024-08-12 DIAGNOSIS — T847XXA Infection and inflammatory reaction due to other internal orthopedic prosthetic devices, implants and grafts, initial encounter: Secondary | ICD-10-CM

## 2024-08-12 DIAGNOSIS — S065XAA Traumatic subdural hemorrhage with loss of consciousness status unknown, initial encounter: Secondary | ICD-10-CM

## 2024-08-12 DIAGNOSIS — I10 Essential (primary) hypertension: Secondary | ICD-10-CM

## 2024-08-12 DIAGNOSIS — F1011 Alcohol abuse, in remission: Secondary | ICD-10-CM

## 2024-08-12 DIAGNOSIS — T8579XA Infection and inflammatory reaction due to other internal prosthetic devices, implants and grafts, initial encounter: Secondary | ICD-10-CM

## 2024-08-12 DIAGNOSIS — T8149XA Infection following a procedure, other surgical site, initial encounter: Secondary | ICD-10-CM

## 2024-08-12 DIAGNOSIS — Z5181 Encounter for therapeutic drug level monitoring: Secondary | ICD-10-CM

## 2024-08-12 DIAGNOSIS — T86832 Bone graft infection: Secondary | ICD-10-CM

## 2024-08-12 DIAGNOSIS — E46 Unspecified protein-calorie malnutrition: Secondary | ICD-10-CM

## 2024-08-12 HISTORY — PX: BURR HOLE: SHX908

## 2024-08-12 LAB — C-REACTIVE PROTEIN: CRP: 14.2 mg/dL — ABNORMAL HIGH (ref <1.0)

## 2024-08-12 MED ORDER — LIDOCAINE-EPINEPHRINE 1 %-1:100000 IJ SOLN
INTRAMUSCULAR | Status: AC
Start: 2024-08-12 — End: 2024-08-12
  Filled 2024-08-12: qty 1

## 2024-08-12 MED ORDER — CHLORHEXIDINE GLUCONATE 0.12 % MT SOLN
OROMUCOSAL | Status: AC
Start: 2024-08-12 — End: 2024-08-12
  Administered 2024-08-12: 15 mL via OROMUCOSAL
  Filled 2024-08-12: qty 15

## 2024-08-12 MED ORDER — PHENYLEPHRINE HCL-NACL 20-0.9 MG/250ML-% IV SOLN
INTRAVENOUS | Status: DC | PRN
  Administered 2024-08-12: 75 ug/min via INTRAVENOUS
  Administered 2024-08-12: 45 ug/min via INTRAVENOUS

## 2024-08-12 MED ORDER — SODIUM CHLORIDE 0.9 % IV SOLN: INTRAVENOUS | Status: DC

## 2024-08-12 MED ORDER — SODIUM CHLORIDE 0.9 % IV SOLN
2.0000 g | Freq: Three times a day (TID) | INTRAVENOUS | Status: DC
  Administered 2024-08-12 – 2024-08-13 (×3): 2 g via INTRAVENOUS
  Filled 2024-08-12 (×4): qty 2

## 2024-08-12 MED ORDER — ONDANSETRON HCL 4 MG/2ML IJ SOLN
INTRAMUSCULAR | Status: AC
  Filled 2024-08-12: qty 2

## 2024-08-12 MED ORDER — THIAMINE HCL 100 MG/ML IJ SOLN
500.0000 mg | Freq: Three times a day (TID) | INTRAVENOUS | Status: AC
  Administered 2024-08-12 – 2024-08-14 (×5): 500 mg via INTRAVENOUS
  Filled 2024-08-12: qty 466.67
  Filled 2024-08-12 (×2): qty 500
  Filled 2024-08-12 (×2): qty 5
  Filled 2024-08-12: qty 500
  Filled 2024-08-12: qty 466.67

## 2024-08-12 MED ORDER — CEFAZOLIN IN SODIUM CHLORIDE 2-0.9 GM/100ML-% IV SOLN
2.0000 g | Freq: Once | INTRAVENOUS | Status: DC
  Filled 2024-08-12 (×2): qty 100

## 2024-08-12 MED ORDER — ROCURONIUM BROMIDE 10 MG/ML (PF) SYRINGE
PREFILLED_SYRINGE | INTRAVENOUS | Status: DC | PRN
  Administered 2024-08-12: 70 mg via INTRAVENOUS

## 2024-08-12 MED ORDER — ORAL CARE MOUTH RINSE: 15.0000 mL | Freq: Once | OROMUCOSAL | Status: AC

## 2024-08-12 MED ORDER — HYDROGEN PEROXIDE 3 % EX SOLN
CUTANEOUS | Status: DC | PRN
  Administered 2024-08-12: 1

## 2024-08-12 MED ORDER — LABETALOL HCL 5 MG/ML IV SOLN: 10.0000 mg | INTRAVENOUS | Status: AC | PRN

## 2024-08-12 MED ORDER — LEVETIRACETAM (KEPPRA) 500 MG/5 ML ADULT IV PUSH
500.0000 mg | Freq: Two times a day (BID) | INTRAVENOUS | Status: DC
  Administered 2024-08-12 – 2024-08-14 (×4): 500 mg via INTRAVENOUS
  Filled 2024-08-12 (×4): qty 5

## 2024-08-12 MED ORDER — FENTANYL CITRATE (PF) 100 MCG/2ML IJ SOLN
25.0000 ug | INTRAMUSCULAR | Status: DC | PRN
  Administered 2024-08-12: 50 ug via INTRAVENOUS

## 2024-08-12 MED ORDER — BACITRACIN ZINC 500 UNIT/GM EX OINT
TOPICAL_OINTMENT | CUTANEOUS | Status: DC | PRN
  Administered 2024-08-12: 1 via TOPICAL

## 2024-08-12 MED ORDER — FENTANYL CITRATE (PF) 250 MCG/5ML IJ SOLN
INTRAMUSCULAR | Status: AC
  Filled 2024-08-12: qty 5

## 2024-08-12 MED ORDER — ONDANSETRON HCL 4 MG/2ML IJ SOLN
4.0000 mg | Freq: Once | INTRAMUSCULAR | Status: AC | PRN
  Administered 2024-08-12: 4 mg via INTRAVENOUS

## 2024-08-12 MED ORDER — ONDANSETRON HCL 4 MG/2ML IJ SOLN
INTRAMUSCULAR | Status: DC | PRN
  Administered 2024-08-12: 4 mg via INTRAVENOUS

## 2024-08-12 MED ORDER — CHLORHEXIDINE GLUCONATE 0.12 % MT SOLN: 15.0000 mL | Freq: Once | OROMUCOSAL | Status: AC

## 2024-08-12 MED ORDER — VANCOMYCIN HCL 1250 MG/250ML IV SOLN
1250.0000 mg | Freq: Once | INTRAVENOUS | Status: AC
  Administered 2024-08-12: 1250 mg via INTRAVENOUS
  Filled 2024-08-12: qty 250

## 2024-08-12 MED ORDER — OXYCODONE HCL 5 MG/5ML PO SOLN: 5.0000 mg | Freq: Once | ORAL | Status: DC | PRN

## 2024-08-12 MED ORDER — PHENYLEPHRINE 80 MCG/ML (10ML) SYRINGE FOR IV PUSH (FOR BLOOD PRESSURE SUPPORT)
PREFILLED_SYRINGE | INTRAVENOUS | Status: DC | PRN
  Administered 2024-08-12: 240 ug via INTRAVENOUS

## 2024-08-12 MED ORDER — FENTANYL CITRATE (PF) 50 MCG/ML IJ SOSY
50.0000 ug | PREFILLED_SYRINGE | INTRAMUSCULAR | Status: DC | PRN
  Filled 2024-08-12: qty 1

## 2024-08-12 MED ORDER — VANCOMYCIN HCL 1000 MG IV SOLR
INTRAVENOUS | Status: DC | PRN
  Administered 2024-08-12: 2 g

## 2024-08-12 MED ORDER — VANCOMYCIN HCL 750 MG/150ML IV SOLN
750.0000 mg | Freq: Two times a day (BID) | INTRAVENOUS | Status: DC
  Administered 2024-08-13 (×2): 750 mg via INTRAVENOUS
  Filled 2024-08-12 (×3): qty 150

## 2024-08-12 MED ORDER — FENTANYL CITRATE (PF) 100 MCG/2ML IJ SOLN
INTRAMUSCULAR | Status: AC
  Filled 2024-08-12: qty 2

## 2024-08-12 MED ORDER — SUGAMMADEX SODIUM 200 MG/2ML IV SOLN
INTRAVENOUS | Status: DC | PRN
  Administered 2024-08-12: 200 mg via INTRAVENOUS

## 2024-08-12 MED ORDER — CEFAZOLIN SODIUM-DEXTROSE 2-3 GM-%(50ML) IV SOLR
INTRAVENOUS | Status: DC | PRN
  Administered 2024-08-12: 2 g via INTRAVENOUS

## 2024-08-12 MED ORDER — BUTALBITAL-APAP-CAFFEINE 50-325-40 MG PO TABS
2.0000 | ORAL_TABLET | ORAL | Status: DC | PRN
  Administered 2024-08-12 – 2024-08-13 (×4): 2 via ORAL
  Filled 2024-08-12 (×5): qty 2

## 2024-08-12 MED ORDER — PROPOFOL 10 MG/ML IV BOLUS
INTRAVENOUS | Status: DC | PRN
  Administered 2024-08-12: 60 mg via INTRAVENOUS

## 2024-08-12 MED ORDER — PANTOPRAZOLE SODIUM 40 MG IV SOLR
40.0000 mg | Freq: Every day | INTRAVENOUS | Status: DC
  Administered 2024-08-12 – 2024-08-15 (×4): 40 mg via INTRAVENOUS
  Filled 2024-08-12 (×4): qty 10

## 2024-08-12 MED ORDER — LIDOCAINE 2% (20 MG/ML) 5 ML SYRINGE
INTRAMUSCULAR | Status: DC | PRN
  Administered 2024-08-12: 100 mg via INTRAVENOUS

## 2024-08-12 MED ORDER — CEFAZOLIN SODIUM-DEXTROSE 2-4 GM/100ML-% IV SOLN: 2.0000 g | Freq: Three times a day (TID) | INTRAVENOUS | Status: DC

## 2024-08-12 MED ORDER — THIAMINE HCL 100 MG/ML IJ SOLN
100.0000 mg | INTRAMUSCULAR | Status: DC
  Administered 2024-08-14 – 2024-08-18 (×5): 100 mg via INTRAVENOUS
  Filled 2024-08-12 (×5): qty 2

## 2024-08-12 MED ORDER — MIDAZOLAM HCL 2 MG/2ML IJ SOLN
INTRAMUSCULAR | Status: AC
  Filled 2024-08-12: qty 2

## 2024-08-12 MED ORDER — POTASSIUM CHLORIDE IN NACL 20-0.9 MEQ/L-% IV SOLN
INTRAVENOUS | Status: AC
  Filled 2024-08-12: qty 1000

## 2024-08-12 MED ORDER — BACITRACIN ZINC 500 UNIT/GM EX OINT
TOPICAL_OINTMENT | CUTANEOUS | Status: AC
  Filled 2024-08-12: qty 28.35

## 2024-08-12 MED ORDER — CLONAZEPAM 1 MG PO TABS
1.0000 mg | ORAL_TABLET | Freq: Two times a day (BID) | ORAL | Status: DC
  Administered 2024-08-12 – 2024-08-13 (×2): 1 mg via ORAL
  Filled 2024-08-12 (×2): qty 1

## 2024-08-12 MED ORDER — OXYCODONE HCL 5 MG PO TABS: 5.0000 mg | ORAL_TABLET | Freq: Once | ORAL | Status: DC | PRN

## 2024-08-12 MED ORDER — FENTANYL CITRATE (PF) 250 MCG/5ML IJ SOLN
INTRAMUSCULAR | Status: DC | PRN
Start: 2024-08-12 — End: 2024-08-12
  Administered 2024-08-12 (×3): 50 ug via INTRAVENOUS
  Administered 2024-08-12: 100 ug via INTRAVENOUS

## 2024-08-12 MED ORDER — FENTANYL CITRATE (PF) 50 MCG/ML IJ SOSY
25.0000 ug | PREFILLED_SYRINGE | INTRAMUSCULAR | Status: DC | PRN
  Administered 2024-08-12: 25 ug via INTRAVENOUS

## 2024-08-12 MED ORDER — ACETAMINOPHEN 10 MG/ML IV SOLN: 1000.0000 mg | Freq: Once | INTRAVENOUS | Status: DC | PRN

## 2024-08-12 MED ORDER — DEXAMETHASONE SOD PHOSPHATE PF 10 MG/ML IJ SOLN
INTRAMUSCULAR | Status: DC | PRN
  Administered 2024-08-12: 10 mg via INTRAVENOUS

## 2024-08-12 MED ORDER — 0.9 % SODIUM CHLORIDE (POUR BTL) OPTIME
TOPICAL | Status: DC | PRN
  Administered 2024-08-12: 2000 mL

## 2024-08-12 SURGICAL SUPPLY — 1 items: NDL HYPO 22X1.5 SAFETY MO (MISCELLANEOUS) ×1 IMPLANT
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
## 2024-08-12 NOTE — Progress Notes (Signed)
 PT Cancellation Note  Patient Details Name: Ernest Romero MRN: [id number] DOB: 11/07/1965   Cancelled Treatment:    Reason Eval/Treat Not Completed: (P) Patient at procedure or test/unavailable (Pt off floor for procedure then transferred to 4N. Will follow up as able.)   Darryle George 08/12/2024, 11:42 AM

## 2024-08-12 NOTE — Progress Notes (Signed)
 SLP Cancellation Note  Patient Details Name: DONNE BALEY MRN: [id number] DOB: [birth year]   Cancelled treatment:       Reason Eval/Treat Not Completed: Patient at procedure or test/unavailable. Will f/u as able.    Damien Blumenthal, M.A., CCC-SLP Speech Language Pathology, Acute Rehabilitation Services  Secure Chat preferred [phone]  08/12/2024, 9:22 AM

## 2024-08-12 NOTE — Anesthesia Preprocedure Evaluation (Addendum)
 Anesthesia Evaluation  Patient identified by MRN, date of birth, ID band Patient awake    Reviewed: Allergy & Precautions, NPO status , Patient's Chart, lab work & pertinent test results, reviewed documented beta blocker date and time   History of Anesthesia Complications Negative for: history of anesthetic complications  Airway Mallampati: III  TM Distance: >3 FB   Mouth opening: Limited Mouth Opening  Dental  (+) Poor Dentition   Pulmonary neg COPD, Patient abstained from smoking., former smoker   breath sounds clear to auscultation       Cardiovascular hypertension, (-) angina (-) CAD and (-) Past MI  Rhythm:Regular Rate:Normal     Neuro/Psych neg Seizures PSYCHIATRIC DISORDERS Anxiety Depression Bipolar Disorder   Encephalopathy  S/p craniectomy for SDH  IMPRESSION: 1. Significant interval increase in the extra-axial fluid collection underlying the left craniotomy flap, now 22 mm in radial dimension, with increased mass effect on the left cerebral hemisphere, partial effacement of the left lateral ventricle, and slight 12 mm rightward midline shift. The collection extends beyond the craniotomy flap into the overlying scalp soft tissues.     GI/Hepatic ,neg GERD  ,,(+) Hepatitis -  Endo/Other    Renal/GU      Musculoskeletal   Abdominal   Peds  Hematology   Anesthesia Other Findings   Reproductive/Obstetrics                              Anesthesia Physical Anesthesia Plan  ASA: 3  Anesthesia Plan: General   Post-op Pain Management:    Induction: Intravenous  PONV Risk Score and Plan: 2 and Ondansetron   Airway Management Planned: Oral ETT and Video Laryngoscope Planned  Additional Equipment:   Intra-op Plan:   Post-operative Plan: Extubation in OR  Informed Consent: I have reviewed the patients History and Physical, chart, labs and discussed the procedure including  the risks, benefits and alternatives for the proposed anesthesia with the patient or authorized representative who has indicated his/her understanding and acceptance.     Dental advisory given and History available from chart only  Plan Discussed with: CRNA  Anesthesia Plan Comments:         Anesthesia Quick Evaluation

## 2024-08-12 NOTE — Transfer of Care (Signed)
 Immediate Anesthesia Transfer of Care Note  Patient: Ernest Romero  Procedure(s) Performed: left bone flap removal  Patient Location: PACU  Anesthesia Type:General  Level of Consciousness: drowsy and patient cooperative  Airway & Oxygen Therapy: Patient Spontanous Breathing  Post-op Assessment: Report given to RN, Post -op Vital signs reviewed and stable, and Patient moving all extremities X 4  Post vital signs: Reviewed and stable  Last Vitals:  Vitals Value Taken Time  BP 139/97 08/12/24 10:41  Temp    Pulse 94 08/12/24 10:43  Resp 17 08/12/24 10:43  SpO2 92 % 08/12/24 10:43  Vitals shown include unfiled device data.  Last Pain:  Vitals:   08/12/24 0838  TempSrc:   PainSc: 0-No pain      Patients Stated Pain Goal: 3 (07/19/24 1209)  Complications: No notable events documented.

## 2024-08-12 NOTE — Progress Notes (Signed)
   08/12/24 1500  Spiritual Encounters  Type of Visit Attempt (pt unavailable)  Conversation partners present during encounter Nurse    Chaplain responded to consult request for support. RN was present in the room; he stated that the patient had procedure in the morning and he is resting right now. Chaplain plans to make a follow up visit later.   M.Kubra Susanna Kerry Resident [phone]

## 2024-08-12 NOTE — Progress Notes (Signed)
   NAME:  Ernest Romero, MRN:  [id number], DOB:  Jul 16, 1966, LOS: 28 ADMISSION DATE:  07/15/2024, CONSULTATION DATE: 07/29/2024 REFERRING MD:  JONELLE Sable MD, CHIEF COMPLAINT:   Postcraniotomy and bone flap  History of Present Illness:  58 year old male with past medical history of alcohol abuse, bipolar disorder, panic disorder who presented to [facility] from jail in September for large subdural hematoma s/p left frontal craniotomy and evacuation of bleed. He was transferred from [facility] to [facility] for flap replacement. While at [facility], patient was on alcohol withdrawal taper of phenobarbital which was discontinued prior to transfer.   Pertinent  Medical History    has a past medical history of Anxiety, Bipolar 1 disorder (HCC), Depression, Hepatitis, and Hypertension.   Significant Hospital Events: Including procedures, antibiotic start and stop dates in addition to other pertinent events   10/27 crani w/ flap replacement 10/29: off clevi, liberate SBP goal, Klonopin , lithium , Haldol  as needed discontinued.  Latuda  dose cut by half 10/30: repeat CT head with increased collection - going to OR this afternoon for evacuation 10/31: OR yesterday for SDH evacuation.  Developed arm twitching.  Klonopin  restarted at half dose 11/1: More awake.  No complaint 11/2 transferred out of ICU. 11/4 cortrak removed 11/8 CT head showed fluid collection under bone flap, decision made to remove bone flap 11/10 to OR for L bone flap removal  Interim History / Subjective:    Objective    Blood pressure (!) 125/90, pulse 94, temperature 98 F (36.7 C), resp. rate 11, height 5' 10 (1.778 m), weight 65 kg, SpO2 98%.        Intake/Output Summary (Last 24 hours) at 08/12/2024 1203 Last data filed at 08/12/2024 1115 Gross per 24 hour  Intake 1120 ml  Output 279 ml  Net 841 ml   Filed Weights   08/10/24 0437 08/12/24 0500 08/12/24 0824  Weight: 65 kg 65 kg 65 kg    Examination: General: Adult male, resting in  bed, in NAD. Neuro: Awake but slow to respond, just received pain meds.SABRA HEENT: Scalp dressings and drains intact. MMM. Cardiovascular: RRR, no M/R/G.  Lungs: Respirations even and unlabored.  CTA bilaterally, No W/R/R. Abdomen: BS x 4, soft, NT/ND.  Musculoskeletal: No gross deformities, no edema.    Assessment and Plan   Subdural hematoma s/p initial craniotomy 9/28 (Dr. Loa poor barr) followed by skull flap left cranioplasty 10/27 (Dr. Sable). Unfortunately complicated by infected bone flap which is now s/p removal 11/10. - Post op care per Dr. Sable with NSGY. - Abx per ID, vanc/ceftazadime. Appreciate the assistance. - Follow cultures. - Keppra. - Tylenol , Fioricet, low dose Fentanyl  PRN - CT head in AM. - PT/OT when able.  Impaired mental capacity - 2/2 above. - Per [facility] notes, likely needs placement for guardianship in the long term.  Bipolar disorder - Continue Lurasidone , supportive care.  History of alcohol, tobacco abuse - thiamine , folic acid , multivitamin   HTN - continue amlodipine , losartan  Protein calorie malnutrition. - RD following.   Sammi Gore, PA - [facility] For pager details, please see AMION or use Epic chat  After 1900, please call [facility] for cross coverage needs 08/12/2024, 12:03 PM

## 2024-08-12 NOTE — Op Note (Signed)
 DATE OF SURGERY: 08/12/2024   ATTENDING SURGEON: Dino Sable, MD   ASSISTANT: None   PREOPERATIVE DIAGNOSIS: Boneflap infection   POSTOPERATIVE DIAGNOSIS: Same    PROCEDURE PERFORMED:  1. LEFT bone flap removal  ANESTHESIA: General endotracheal anesthesia.     ESTIMATED BLOOD LOSS, URINE OUTPUT, AND CRYSTALLOIDS:   See anesthesia chart.     COMPLICATIONS: None.     SPECIMENS: Cultures   DRAINS: JP x2    PREOPERATIVE COURSE:   58 year old gentleman who had a left-sided subdural hematoma and underwent a decompressive hemicraniectomy and evacuation.  We recently did an artificial bone flap replacement but unfortunately after few days he developed a subdural hematoma which required a bur hole evacuation.  Patient was doing very well however developed swelling around his bone flap and started leaking.  An infection was suspected.  We notified the patient's friend as this bone flap needed to be removed [patient is not competent to make his own decisions] and he agreed.   DESCRIPTION OF PROCEDURE: Patient brought to the operating room and after general trach anesthesia was ensued he was placed in the supine position with the head turned to the right.  Sutures were removed after which skin was cleansed.  He was prepped and draped in usual sterile fashion after which the old incision was opened with scissors and the skin flap was reflected anteriorly.  Egress of purulent material was seen which was then cultured x 2.  After the bone flap and the plate and screws were removed and the subdural/epidural space were washed.  This was cleansed to with the [facility] #1 and after this copious irrigation with antibiotic saline and peroxide was performed.  2 drains were left in situ after which the incision was closed in multiple layers and the drains were secured to the skin.  At the end of the procedure all counts were complete.

## 2024-08-12 NOTE — Plan of Care (Signed)
  Problem: Education: Goal: Knowledge of General Education information will improve Description: Including pain rating scale, medication(s)/side effects and non-pharmacologic comfort measures Outcome: Progressing   Problem: Health Behavior/Discharge Planning: Goal: Ability to manage health-related needs will improve Outcome: Progressing   Problem: Clinical Measurements: Goal: Ability to maintain clinical measurements within normal limits will improve Outcome: Progressing Goal: Will remain free from infection Outcome: Progressing Goal: Diagnostic test results will improve Outcome: Progressing Goal: Respiratory complications will improve Outcome: Progressing Goal: Cardiovascular complication will be avoided Outcome: Progressing   Problem: Activity: Goal: Risk for activity intolerance will decrease Outcome: Progressing   Problem: Nutrition: Goal: Adequate nutrition will be maintained Outcome: Progressing   Problem: Coping: Goal: Level of anxiety will decrease Outcome: Progressing   Problem: Elimination: Goal: Will not experience complications related to bowel motility Outcome: Progressing Goal: Will not experience complications related to urinary retention Outcome: Progressing   Problem: Pain Managment: Goal: General experience of comfort will improve and/or be controlled Outcome: Progressing   Problem: Safety: Goal: Ability to remain free from injury will improve Outcome: Progressing   Problem: Skin Integrity: Goal: Risk for impaired skin integrity will decrease Outcome: Progressing   Problem: Education: Goal: Knowledge of the prescribed therapeutic regimen will improve Outcome: Progressing   Problem: Clinical Measurements: Goal: Usual level of consciousness will be regained or maintained. Outcome: Progressing Goal: Neurologic status will improve Outcome: Progressing Goal: Ability to maintain intracranial pressure will improve Outcome: Progressing    Problem: Skin Integrity: Goal: Demonstration of wound healing without infection will improve Outcome: Progressing

## 2024-08-12 NOTE — Anesthesia Postprocedure Evaluation (Signed)
 Anesthesia Post Note  Patient: Ernest Romero  Procedure(s) Performed: left bone flap removal     Patient location during evaluation: PACU Anesthesia Type: General Level of consciousness: awake and alert Pain management: pain level controlled Vital Signs Assessment: post-procedure vital signs reviewed and stable Respiratory status: spontaneous breathing, nonlabored ventilation, respiratory function stable and patient connected to nasal cannula oxygen Cardiovascular status: blood pressure returned to baseline and stable Postop Assessment: no apparent nausea or vomiting Anesthetic complications: no   No notable events documented.  Last Vitals:  Vitals:   08/12/24 1200 08/12/24 1300  BP: 104/79 (!) 143/98  Pulse: 88 96  Resp: 10 20  Temp:    SpO2: 96% 99%    Last Pain:  Vitals:   08/12/24 1155  TempSrc:   PainSc: 9                  [name]

## 2024-08-12 NOTE — Consult Note (Incomplete)
 NAME:  Ernest Romero, MRN:  [id number], DOB:  November 04, 1965, LOS: 28 ADMISSION DATE:  07/15/2024, CONSULTATION DATE:  08/12/24 REFERRING MD:  [name], CHIEF COMPLAINT:  postop   History of Present Illness:  58 year old man w/ hx etoh abuse, bipolar, ?schizophrenia who [facility] unresponsive episode on 06/30/24.  Found to have large left subdural hematoma  06/30/24 left frontal craniectomy for SDH evacuation [facility]); hospital course complicated by agitation, ? EtOH w/d vs. Manic episodes vs. 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 rate 11, height 5' 10 (1.778 m), weight 65 kg, SpO2 97%.        Intake/Output Summary (Last 24 hours) at 08/12/2024 1133 Last data filed at 08/12/2024 1115 Gross per 24 hour  Intake 1120 ml  Output 279 ml  Net 841 ml   Filed Weights   08/10/24 0437 08/12/24 0500 08/12/24 0824  Weight: 65 kg 65 kg 65 kg    Examination: General: *** HENT: *** Lungs: *** Cardiovascular: *** Abdomen: *** Extremities: *** Neuro: *** GU: ***  Resolved problem list   Assessment and Plan     Labs   CBC: Recent Labs  Lab 08/06/24 0313 08/08/24 0242 08/09/24 0121  WBC 7.5 10.6* 9.3  HGB 13.6 13.2 12.9*  HCT 39.4 39.1 38.6*  MCV 87.0 88.1 87.9  PLT 338 360 344    Basic Metabolic Panel: Recent Labs  Lab 08/06/24 0313 08/08/24 0242 08/09/24 0121  08/10/24 1741  NA 134* 139 137 138  K 3.8 3.9 3.7 4.2  CL 102 105 103 102  CO2 22 23 23 25   GLUCOSE 96 95 99 110*  BUN 16 15 13 14   CREATININE 0.92 0.90 0.83 0.97  CALCIUM 9.2 9.0 9.1 9.3  PHOS  --   --   --  4.5   GFR: Estimated Creatinine Clearance: 74.5 mL/min (by C-G formula based on SCr of 0.97 mg/dL). Recent Labs  Lab 08/06/24 0313 08/08/24 0242 08/09/24 0121  WBC 7.5 10.6* 9.3    Liver Function Tests: Recent Labs  Lab 08/06/24 0313 08/09/24 0121 08/10/24 1741  AST 92* 38  --   ALT 118* 77*  --   ALKPHOS 82 75  --   BILITOT 0.4 0.4  --   PROT 7.1 6.4*  --   ALBUMIN 3.8 3.5 3.5   No results for input(s): LIPASE, AMYLASE in the last 168 hours. No results for input(s): AMMONIA in the last 168 hours.  ABG    Component Value Date/Time   PHART 7.393 07/29/2024 1717   PCO2ART 34.9 07/29/2024 1717   PO2ART 248 (H) 07/29/2024 1717   HCO3 24.6 07/31/2024 1541   TCO2 22 07/29/2024 1717   ACIDBASEDEF 3.0 (H) 07/29/2024 1717   O2SAT 95.1 07/31/2024 1541     Coagulation Profile: No results for input(s): INR, PROTIME in the last 168 hours.  Cardiac Enzymes: No results for input(s): CKTOTAL, CKMB, CKMBINDEX, TROPONINI in the last 168  hours.  HbA1C: No results found for: HGBA1C  CBG: Recent Labs  Lab 08/06/24 1137 08/06/24 1527 08/06/24 1935 08/06/24 2347 08/07/24 0800  GLUCAP 148* 139* 162* 106* 146*    Review of Systems:   ***  Past Medical History:  He,  has a past medical history of Anxiety, Bipolar 1 disorder (HCC), Depression, Hepatitis, and Hypertension.   Surgical History:   Past Surgical History:  Procedure Laterality Date   BURR HOLE N/A 08/01/2024   Procedure: CREATION, CRANIAL BURR HOLE WITH EVACUATION OF HEMATOMA;  Surgeon: Rosslyn Dino HERO, MD;  Location: [facility] OR;  Service: Neurosurgery;  Laterality: N/A;   CRANIOPLASTY, WITH BONE FLAP REPLACEMENT Left 07/29/2024   Procedure: CRANIOPLASTY, WITH BONE FLAP  REPLACEMENT;  Surgeon: Rosslyn Dino HERO, MD;  Location: [facility] OR;  Service: Neurosurgery;  Laterality: Left;  LEFT CRANIOPLASTY WITH ARTIFICAL BONE FLAP REPLACEMENT   CRANIOTOMY Left 06/30/2024   Procedure: CRANIOTOMY HEMATOMA EVACUATION SUBDURAL;  Surgeon: Melonie Grass, MD;  Location: [facility] ORS;  Service: Neurosurgery;  Laterality: Left;     Social History:   reports that he has quit smoking. He has never used smokeless tobacco. He reports that he does not currently use alcohol. He reports that he does not currently use drugs.   Family History:  His family history is not on file.   Allergies No Known Allergies   Home Medications  Prior to Admission medications   Medication Sig Start Date End Date Taking? Authorizing Provider  acetaminophen  (TYLENOL ) 500 MG tablet Take 2 tablets (1,000 mg total) by mouth 3 (three) times daily as needed for mild pain (pain score 1-3), moderate pain (pain score 4-6) or headache. 07/15/24   Awanda City, MD  amLODipine  (NORVASC ) 5 MG tablet Take 5 mg by mouth daily. 11/28/23   [provider]  clonazePAM  (KLONOPIN ) 1 MG tablet Take 1 tablet (1 mg total) by mouth 2 (two) times daily. 02/25/24   Cleaster Tinnie LABOR, PA-C  feeding supplement (ENSURE PLUS HIGH PROTEIN) LIQD Take 237 mLs by mouth 3 (three) times daily between meals. 07/15/24   Awanda City, MD  folic acid  (FOLVITE ) 1 MG tablet Take 1 tablet (1 mg total) by mouth daily. 07/15/24   Awanda City, MD  lithium  carbonate (ESKALITH ) 450 MG CR tablet Take 1 tablet (450 mg total) by mouth at bedtime. 06/01/16   Sainani, Vivek J, MD  lurasidone  (LATUDA ) 20 MG TABS tablet Take 2 tablets (40 mg total) by mouth daily with breakfast. Increased from 20 mg. 07/15/24   Awanda City, MD  Multiple Vitamin (MULTIVITAMIN WITH MINERALS) TABS tablet Take 1 tablet by mouth daily. 07/15/24   Awanda City, MD  nicotine (NICODERM CQ - DOSED IN MG/24 HOURS) 21 mg/24hr patch Place 1 patch (21 mg total) onto the skin daily. 07/15/24    Awanda City, MD  Nutritional Supplements (,FEEDING SUPPLEMENT, PROSOURCE PLUS) liquid Take 30 mLs by mouth 2 (two) times daily between meals. 07/15/24   Awanda City, MD  thiamine  (VITAMIN B-1) 100 MG tablet Take 1 tablet (100 mg total) by mouth daily. 07/15/24   Awanda City, MD  ziprasidone  (GEODON ) 20 MG injection Inject 10 mg into the muscle every 6 (six) hours as needed for agitation. 07/15/24   Awanda City, MD     Critical care time: ***

## 2024-08-12 NOTE — Progress Notes (Signed)
 Pharmacy Antibiotic Note  Ernest Romero is a 58 y.o. male admitted on 07/15/2024 with a subdural hematoma s/p cranioplasty with bone flap replacement on 10/27 and Burr hole on 10/30. Now with concern for bone flap infection and bone flap was removed this AM in the OR.  Pharmacy has been consulted for vancomycin dosing.  Scr -0.97 on 11/8.   Plan: Vancomycin 1250 mg x 1 then 750 mg IV q 12 hours  Dosing for CNS penetration will aim for trough 15-20 Also on Ceftazidime 2 gm IV q 8 hours  Monitor renal function, vanc trough as appropriate, culture data   Height: 5' 10 (177.8 cm) Weight: 65 kg (143 lb 4.8 oz) IBW/kg (Calculated) : 73  Temp (24hrs), Avg:98.5 F (36.9 C), Min:98 F (36.7 C), Max:99.3 F (37.4 C)  Recent Labs  Lab 08/06/24 0313 08/08/24 0242 08/09/24 0121 08/10/24 1741  WBC 7.5 10.6* 9.3  --   CREATININE 0.92 0.90 0.83 0.97    Estimated Creatinine Clearance: 74.5 mL/min (by C-G formula based on SCr of 0.97 mg/dL).    No Known Allergies   Thank you for allowing pharmacy to be a part of this patient's care.  Damien Quiet, PharmD, BCPS, BCIDP Infectious Diseases Clinical Pharmacist Phone: [phone] 08/12/2024 11:52 AM

## 2024-08-12 NOTE — H&P (Signed)
 58yo gentleman with a recurrent subdural hematoma  Past Medical History:  Diagnosis Date   Anxiety    Bipolar 1 disorder (HCC)    Depression    Hepatitis    Hypertension    Past Surgical History:  Procedure Laterality Date   BURR HOLE N/A 08/01/2024   Procedure: CREATION, CRANIAL BURR HOLE WITH EVACUATION OF HEMATOMA;  Surgeon: Rosslyn Dino HERO, MD;  Location: [facility] OR;  Service: Neurosurgery;  Laterality: N/A;   CRANIOPLASTY, WITH BONE FLAP REPLACEMENT Left 07/29/2024   Procedure: CRANIOPLASTY, WITH BONE FLAP REPLACEMENT;  Surgeon: Rosslyn Dino HERO, MD;  Location: [facility] OR;  Service: Neurosurgery;  Laterality: Left;  LEFT CRANIOPLASTY WITH ARTIFICAL BONE FLAP REPLACEMENT   CRANIOTOMY Left 06/30/2024   Procedure: CRANIOTOMY HEMATOMA EVACUATION SUBDURAL;  Surgeon: Melonie Grass, MD;  Location: [facility] ORS;  Service: Neurosurgery;  Laterality: Left;   Social History   Socioeconomic History   Marital status: Widowed    Spouse name: Not on file   Number of children: Not on file   Years of education: Not on file   Highest education level: Not on file  Occupational History   Not on file  Tobacco Use   Smoking status: Former   Smokeless tobacco: Never  Vaping Use   Vaping status: Every Day   Substances: Nicotine, Flavoring  Substance and Sexual Activity   Alcohol use: Not Currently    Comment: Denies currently using   Drug use: Not Currently    Comment: Denies current use   Sexual activity: Never  Other Topics Concern   Not on file  Social History Narrative   Not on file   Social Drivers of Health   Financial Resource Strain: Not on file  Food Insecurity: Patient Unable To Answer (07/16/2024)   Hunger Vital Sign    Worried About Running Out of Food in the Last Year: Patient unable to answer    Ran Out of Food in the Last Year: Patient unable to answer  Transportation Needs: Patient Unable To Answer (07/16/2024)   PRAPARE - Transportation    Lack of Transportation  (Medical): Patient unable to answer    Lack of Transportation (Non-Medical): Patient unable to answer  Physical Activity: Not on file  Stress: Not on file  Social Connections: Not on file  Intimate Partner Violence: Patient Unable To Answer (07/16/2024)   Humiliation, Afraid, Rape, and Kick questionnaire    Fear of Current or Ex-Partner: Patient unable to answer    Emotionally Abused: Patient unable to answer    Physically Abused: Patient unable to answer    Sexually Abused: Patient unable to answer   History reviewed. No pertinent family history. No Known Allergies Scheduled Meds:  [MAR Hold] amLODipine   10 mg Oral Daily   [MAR Hold] Chlorhexidine Gluconate Cloth  6 each Topical Daily   [MAR Hold] clonazePAM   0.5 mg Oral QHS   [MAR Hold] docusate  100 mg Per Tube Daily   [MAR Hold] feeding supplement  237 mL Oral BID BM   [MAR Hold] folic acid   1 mg Oral Daily   [MAR Hold] gabapentin  200 mg Oral TID   [MAR Hold] losartan  50 mg Oral Daily   [MAR Hold] lurasidone   40 mg Oral QHS   [MAR Hold] multivitamin with minerals  1 tablet Oral Daily   [MAR Hold] nicotine  14 mg Transdermal Daily   [MAR Hold] senna  1 tablet Per Tube Daily   [MAR Hold] sodium chloride  flush  10-40 mL  Intracatheter Q12H   [MAR Hold] thiamine   100 mg Per Tube Daily   Continuous Infusions:  sodium chloride  10 mL/hr at 08/12/24 0842   PRN Meds:.[MAR Hold] acetaminophen  **OR** [PENDING] acetaminophen  **OR** [MAR Hold] acetaminophen  **OR** [PENDING] acetaminophen , [MAR Hold] docusate sodium, [MAR Hold] haloperidol  lactate, [MAR Hold] labetalol , [MAR Hold] melatonin, [MAR Hold] ondansetron  **OR** [MAR Hold] ondansetron  (ZOFRAN ) IV, [MAR Hold] mouth rinse, [MAR Hold] polyethylene glycol, [MAR Hold] promethazine, [MAR Hold] sodium chloride  flush Vitals:   08/12/24 0718 08/12/24 0824  BP: (!) 130/97 101/76  Pulse: 89 84  Resp:  19  Temp: 99.3 F (37.4 C) 98.7 F (37.1 C)  SpO2: 95% 94%   Physical Exam HENT:      Head: Normocephalic.     Nose: Nose normal.  Eyes:     Pupils: Pupils are equal, round, and reactive to light.  Cardiovascular:     Rate and Rhythm: Normal rate.  Pulmonary:     Effort: Pulmonary effort is normal.  Abdominal:     General: Abdomen is flat.  Musculoskeletal:     Cervical back: Normal range of motion.  Neurological:     Mental Status: He is alert.   ASSESSMENT: RECURRENT SUBDURAL HEMATOMA  PLAN: REMOVAL LEFT BONEFLAP

## 2024-08-12 NOTE — Anesthesia Procedure Notes (Signed)
 Procedure Name: Intubation Date/Time: 08/12/2024 9:31 AM  Performed by: Lamar Lucie DASEN, CRNAPre-anesthesia Checklist: Patient identified, Emergency Drugs available, Suction available and Patient being monitored Patient Re-evaluated:Patient Re-evaluated prior to induction Oxygen Delivery Method: Circle system utilized Preoxygenation: Pre-oxygenation with 100% oxygen Induction Type: IV induction Ventilation: Mask ventilation without difficulty and Oral airway inserted - appropriate to patient size Laryngoscope Size: Glidescope and 4 Grade View: Grade I Tube type: Oral Tube size: 7.5 mm Number of attempts: 1 Airway Equipment and Method: Stylet and Oral airway Placement Confirmation: ETT inserted through vocal cords under direct vision, positive ETCO2 and breath sounds checked- equal and bilateral Secured at: 22 cm Tube secured with: Tape Dental Injury: Teeth and Oropharynx as per pre-operative assessment

## 2024-08-12 NOTE — Consult Note (Signed)
 [facility]    Date of Admission:  07/15/2024     Total days of antibiotics 5               Reason for Consult: Post-operative infection   Referring Provider: Dr. [name]  Primary Care Provider: [name]   ASSESSMENT:  Mr. Venard is a 58 y/o caucasian male initially presenting with subdural hematoma requiring evacuation and s/p cranioplasty on 08/01/24 with subsequent development of fluid collection requiring additional burr hole and now found to have infected left bone flap s/p removal.   Mr. Whedbee has been afebrile. Discussed plan of care to start vancomycin and ceftazidime for broad spectrum coverage while awaiting surgical specimens that are pending. Start therapeutic drug monitoring of renal function and vancomycin levels per protocol. Post-operative wound care and drain management per Neurosurgery. Standard/universal precautions. Remaining medical and supportive care per PCCM.   PLAN:  Start broad spectrum coverage with vancomycin and ceftazidime. Therapeutic drug monitoring of renal function and vancomycin levels per protocol.  Monitor surgical specimens and narrow antibiotics as able. Post-operative wound care per Neurosurgery. Remaining medical and supportive care per PCCM.    Principal Problem:   Subdural hematoma (HCC) Active Problems:   Bipolar disorder (HCC)   Essential hypertension   Alcohol withdrawal (HCC)   Unable to make decisions about medical treatment due to impaired mental capacity   Protein-calorie malnutrition, severe   Acute metabolic encephalopathy   Infected bone flap    amLODipine   10 mg Oral Daily   Chlorhexidine Gluconate Cloth  6 each Topical Daily   clonazePAM   1 mg Oral BID   docusate  100 mg Per Tube Daily   feeding supplement  237 mL Oral BID BM   folic acid   1 mg Oral Daily   gabapentin  200 mg Oral TID   levETIRAcetam  500 mg Intravenous Q12H   losartan  50 mg Oral Daily    lurasidone   40 mg Oral QHS   multivitamin with minerals  1 tablet Oral Daily   nicotine  14 mg Transdermal Daily   pantoprazole (PROTONIX) IV  40 mg Intravenous QHS   senna  1 tablet Per Tube Daily   sodium chloride  flush  10-40 mL Intracatheter Q12H   [START ON 08/14/2024] thiamine  (VITAMIN B1) injection  100 mg Intravenous Q24H     HPI: YOVAN LEEMAN is a 58 y.o. male with previous medical history of anxiety, bipolar 1 disorder, and hypertension [facility] the hospital [facility]responsiveness.  Mr. Cairns initially presented to [facility] on 06/30/[facility] from local jail facility after being found unresponsive.  CT head on 06/30/2024 with marked interval increase in size of acute left subdural hematoma compared to previous exams.  Was brought to the OR where 3 bur holes were placed for subdural hematoma evacuation.  Brought back to the OR on 07/29/2024 with craniotomy with flap replacement.  Repeat CT head on 08/01/2024 with increased fluid collection and return to the OR for evacuation.On 08/09/24 noted to have new swelling to suture site on left side of head with CT imaging showing significant increase in extra-axial fluid collection with increased mass effect on left cerebral hemisphere.  Brought to the OR today, 08/12/2024 with concern for left bone flap infection.  Now status post left bone flap removal with findings of purulent material which was sent for culture.  Hardware was removed and drains were placed.  Mr. Medearis has been afebrile. Surgical specimens are pending. ID has been asked for antibiotic recommendations.   Review of Systems: Review of Systems  Unable to perform ROS: Other  Lethargic and answers only basic questions out of surgery   Past Medical History:  Diagnosis Date   Anxiety    Bipolar 1 disorder (HCC)    Depression    Hepatitis    Hypertension     Social History   Tobacco Use   Smoking status: Former   Smokeless  tobacco: Never  Vaping Use   Vaping status: Every Day   Substances: Nicotine, Flavoring  Substance Use Topics   Alcohol use: Not Currently    Comment: Denies currently using   Drug use: Not Currently    Comment: Denies current use    History reviewed. No pertinent family history.  No Known Allergies  OBJECTIVE: Blood pressure 119/84, pulse 92, temperature 98.4 F (36.9 C), temperature source Axillary, resp. rate (!) 24, height 5' 10 (1.778 m), weight 65 kg, SpO2 100%.  Physical Exam Constitutional:      General: He is not in acute distress.    Appearance: He is well-developed.  Cardiovascular:     Rate and Rhythm: Normal rate and regular rhythm.     Heart sounds: Normal heart sounds.  Pulmonary:     Effort: Pulmonary effort is normal.     Breath sounds: Normal breath sounds.  Skin:    General: Skin is warm and dry.  Neurological:     Mental Status: He is alert and oriented to person, place, and time.     Lab Results Lab Results  Component Value Date   WBC 9.3 08/09/2024   HGB 12.9 (L) 08/09/2024   HCT 38.6 (L) 08/09/2024   MCV 87.9 08/09/2024   PLT 344 08/09/2024    Lab Results  Component Value Date   CREATININE 0.97 08/10/2024   BUN 14 08/10/2024   NA 138 08/10/2024   K 4.2 08/10/2024   CL 102 08/10/2024   CO2 25 08/10/2024    Lab Results  Component Value Date   ALT 77 (H) 08/09/2024   AST 38 08/09/2024   ALKPHOS 75 08/09/2024   BILITOT 0.4 08/09/2024     Microbiology: No results found for this or any previous visit (from the past 240 hours).   Greg Evely Gainey, NP [facility]  08/12/2024  4:53 PM

## 2024-08-13 ENCOUNTER — Inpatient Hospital Stay (HOSPITAL_COMMUNITY): Payer: MEDICAID

## 2024-08-13 ENCOUNTER — Encounter (HOSPITAL_COMMUNITY): Payer: Self-pay | Admitting: Neurosurgery

## 2024-08-13 DIAGNOSIS — S065XAD Traumatic subdural hemorrhage with loss of consciousness status unknown, subsequent encounter: Secondary | ICD-10-CM

## 2024-08-13 DIAGNOSIS — B9562 Methicillin resistant Staphylococcus aureus infection as the cause of diseases classified elsewhere: Secondary | ICD-10-CM

## 2024-08-13 DIAGNOSIS — T86832 Bone graft infection: Secondary | ICD-10-CM

## 2024-08-13 DIAGNOSIS — K5900 Constipation, unspecified: Secondary | ICD-10-CM

## 2024-08-13 LAB — BASIC METABOLIC PANEL WITH GFR
Anion gap: 11 (ref 5–15)
BUN: 19 mg/dL (ref 6–20)
CO2: 19 mmol/L — ABNORMAL LOW (ref 22–32)
Calcium: 8.9 mg/dL (ref 8.9–10.3)
Chloride: 108 mmol/L (ref 98–111)
Creatinine, Ser: 1.02 mg/dL (ref 0.61–1.24)
GFR, Estimated: >60 mL/min (ref >60)
Glucose, Bld: 82 mg/dL (ref 70–99)
Potassium: 4.5 mmol/L (ref 3.5–5.1)
Sodium: 138 mmol/L (ref 135–145)

## 2024-08-13 LAB — CBC
HCT: 36.3 % — ABNORMAL LOW (ref 39.0–52.0)
Hemoglobin: 11.6 g/dL — ABNORMAL LOW (ref 13.0–17.0)
MCH: 29.7 pg (ref 26.0–34.0)
MCHC: 32 g/dL (ref 30.0–36.0)
MCV: 92.8 fL (ref 80.0–100.0)
Platelets: 316 K/uL (ref 150–400)
RBC: 3.91 MIL/uL — ABNORMAL LOW (ref 4.22–5.81)
RDW: 12.6 % (ref 11.5–15.5)
WBC: 9.1 K/uL (ref 4.0–10.5)
nRBC: 0 % (ref 0.0–0.2)

## 2024-08-13 LAB — GLUCOSE, CAPILLARY: Glucose-Capillary: 118 mg/dL — ABNORMAL HIGH (ref 70–99)

## 2024-08-13 MED ORDER — SENNA 8.6 MG PO TABS
1.0000 | ORAL_TABLET | Freq: Every day | ORAL | Status: DC
  Administered 2024-08-13: 8.6 mg via ORAL
  Filled 2024-08-13: qty 1

## 2024-08-13 MED ORDER — SENNA 8.6 MG PO TABS
1.0000 | ORAL_TABLET | ORAL | Status: AC
  Administered 2024-08-13: 8.6 mg via ORAL
  Filled 2024-08-13: qty 1

## 2024-08-13 MED ORDER — CLONAZEPAM 0.5 MG PO TABS: 0.5000 mg | ORAL_TABLET | Freq: Two times a day (BID) | ORAL | Status: DC

## 2024-08-13 MED ORDER — CLONAZEPAM 1 MG PO TABS
1.0000 mg | ORAL_TABLET | Freq: Two times a day (BID) | ORAL | Status: DC
  Administered 2024-08-13 – 2024-08-21 (×15): 1 mg via ORAL
  Filled 2024-08-13 (×17): qty 1

## 2024-08-13 MED ORDER — SENNA 8.6 MG PO TABS
2.0000 | ORAL_TABLET | Freq: Every day | ORAL | Status: AC
  Administered 2024-08-15 – 2024-11-07 (×64): 17.2 mg via ORAL
  Filled 2024-08-13 (×74): qty 2

## 2024-08-13 MED ORDER — POLYETHYLENE GLYCOL 3350 17 G PO PACK
17.0000 g | PACK | Freq: Every day | ORAL | Status: AC
  Administered 2024-08-13 – 2024-11-08 (×41): 17 g via ORAL
  Filled 2024-08-13 (×71): qty 1

## 2024-08-13 MED ORDER — HEPARIN SODIUM (PORCINE) 5000 UNIT/ML IJ SOLN
5000.0000 [IU] | Freq: Three times a day (TID) | INTRAMUSCULAR | Status: DC
  Administered 2024-08-13 – 2024-10-01 (×142): 5000 [IU] via SUBCUTANEOUS
  Filled 2024-08-13 (×141): qty 1

## 2024-08-13 NOTE — Progress Notes (Signed)
 Physical Therapy Treatment Patient Details Name: Ernest Romero MRN: [id number] DOB: [birth year] Today's Date: 08/13/2024   History of Present Illness Pt is a 58 y.o. male presenting 10/14 from [facility] where he was admitted 9/28 for unresponsive episode in the jail. 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 On CIWA protocol. Transferred to [facility] for further management of craniotomy and flap. 10/27 s/p cranioplasty with bone flap replacement. 10/28 Change in status thought to be related to Klonopin  administration 10/28, repeat CTH shows significant increase in size of mixed attenuation subdural fluid collection underlying the L tempoparietal craniotomy site, now measuring approximately 15 mm in thickness, with associated mild mass effect and slight rightward midline shift. OR on 10/30 for SDH evacuation. [facility] 11/7 with fluid collected under bone flap. 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 PMH alcohol use disorder, psychiatric disorder, HTN    PT Comments  Pt seen s/p bone flap removal, pt with flat affect and waxing/waning lethargy during session. Pt expresses desire to get OOB, states I have to get moving. PT emphasized the importance of helmet for protection for OOB, donned throughout session. Pt ambulatory for short room distance with use of RW, overall requiring min-mod assist with pt presenting with posterior bias throughout. VSS with BP 90s/70s and MAP 80s all positions. PT to continue to follow.      If plan is discharge home, recommend the following: A lot of help with bathing/dressing/bathroom;Assistance with cooking/housework;Direct supervision/assist for medications management;Direct supervision/assist for financial management;Assist for transportation;Help with stairs or ramp for entrance;Supervision due to cognitive status;A little help with walking and/or transfers   Can travel by private  vehicle        Equipment Recommendations  Wheelchair (measurements PT);Wheelchair cushion (measurements PT);Rolling walker (2 wheels)    Recommendations for Other Services       Precautions / Restrictions Precautions Precautions: Fall Precaution/Restrictions Comments: SBP <160, helmet (pt to wear when OOB per RN), x2 JP drains Restrictions Weight Bearing Restrictions Per Provider Order: No     Mobility  Bed Mobility Overal bed mobility: Needs Assistance Bed Mobility: Supine to Sit, Sit to Supine     Supine to sit: Mod assist, HOB elevated Sit to supine: Mod assist, HOB elevated   General bed mobility comments: assist for trunk elevation/lowering, LE progression to/from EOB.    Transfers Overall transfer level: Needs assistance Equipment used: Rolling walker (2 wheels) Transfers: Sit to/from Stand Sit to Stand: Min assist           General transfer comment: assist for rise and steady, posterior bias requiring PT assist to lean pt anteriorly. stand x3    Ambulation/Gait Ambulation/Gait assistance: Min assist Gait Distance (Feet): 25 Feet Assistive device: Rolling walker (2 wheels) Gait Pattern/deviations: Step-through pattern, Decreased stride length, Narrow base of support, Trunk flexed Gait velocity: decr     General Gait Details: assist for steady, correcting posterior bias, max cues for RW proximity and upright posture.   Stairs             Wheelchair Mobility     Tilt Bed    Modified Rankin (Stroke Patients Only)       Balance Overall balance assessment: Needs assistance Sitting-balance support: Feet supported, Bilateral upper extremity supported Sitting balance-Leahy Scale: Fair Sitting balance - Comments: fair to poor, posterior bias Postural control: Posterior lean Standing balance support: Bilateral upper extremity supported, Reliant on assistive  device for balance Standing balance-Leahy Scale: Poor Standing balance comment:  reliant on RW support                            Communication Communication Communication: Impaired Factors Affecting Communication: Difficulty expressing self  Cognition Arousal: Alert Behavior During Therapy: Flat affect   PT - Cognitive impairments: Awareness, Memory, Attention, Problem solving, Safety/Judgement, Initiation                   Rancho Levels of Cognitive Functioning Rancho Mirant Scales of Cognitive Functioning: Confused, Inappropriate Non-Agitated: Maximal Assistance Rancho Mirant Scales of Cognitive Functioning: Confused, Inappropriate Non-Agitated: Maximal Assistance [V] PT - Cognition Comments: flat affect, periods of eye closing which pt states is due to dizziness vs fatigue Following commands: Impaired Following commands impaired: Follows one step commands with increased time    Cueing Cueing Techniques: Verbal cues, Visual cues  Exercises General Exercises - Lower Extremity Long Arc Quad: AROM, Seated, Both, 5 reps    General Comments        Pertinent Vitals/Pain Pain Assessment Pain Assessment: Faces Faces Pain Scale: Hurts a little bit Pain Location: head Pain Descriptors / Indicators: Discomfort Pain Intervention(s): Limited activity within patient's tolerance, Monitored during session, Repositioned    Home Living                          Prior Function            PT Goals (current goals can now be found in the care plan section) Acute Rehab PT Goals Patient Stated Goal: none stated PT Goal Formulation: With patient Time For Goal Achievement: 08/19/24 Potential to Achieve Goals: Fair Progress towards PT goals: Progressing toward goals    Frequency    Min 2X/week      PT Plan      Co-evaluation              AM-PAC PT 6 Clicks Mobility   Outcome Measure  Help needed turning from your back to your side while in a flat bed without using bedrails?: A Little Help needed moving from  lying on your back to sitting on the side of a flat bed without using bedrails?: A Lot Help needed moving to and from a bed to a chair (including a wheelchair)?: A Lot Help needed standing up from a chair using your arms (e.g., wheelchair or bedside chair)?: A Little Help needed to walk in hospital room?: A Little Help needed climbing 3-5 steps with a railing? : Total 6 Click Score: 14    End of Session Equipment Utilized During Treatment: Gait belt;Other (comment) Activity Tolerance: Patient limited by fatigue Patient left: in bed;with call bell/phone within reach;with bed alarm set Nurse Communication: Mobility status PT Visit Diagnosis: Other symptoms and signs involving the nervous system (R29.898);Muscle weakness (generalized) (M62.81)     Time: 1240-1305 PT Time Calculation (min) (ACUTE ONLY): 25 min  Charges:    $Gait Training: 8-22 mins $Therapeutic Activity: 8-22 mins PT General Charges $$ ACUTE PT VISIT: 1 Visit                     Johana RAMAN, PT DPT Acute Rehabilitation Services Secure Chat Preferred  Office [phone]    Enes Rokosz E Johna 08/13/2024, 4:10 PM

## 2024-08-13 NOTE — Progress Notes (Signed)
 Speech Language Pathology Treatment: Cognitive-Linguistic  Patient Details Name: Ernest Romero MRN: [id number] DOB: 1966-05-28 Today's Date: 08/13/2024 Time: 8378-8370 SLP Time Calculation (min) (ACUTE ONLY): 8 min  Assessment / Plan / Recommendation Clinical Impression  Session today was limited by lethargy with pt unable to verbalize safety precautions or be carryover reorientation attempts of time/situation. He does not recall surgery yesterday and did not rouse for more complex cognitive interventions. His affect is flat and he has difficulty following commands. Despite limited session, this overall appears similar to previous visits with pt presenting with behaviors consistent with Rancho Level V (confused, inappropriate) and may benefit from ongoing SLP f/u.    HPI HPI: 58 yo male with history of alcohol use disorder, psychiatric disorder, and HTN, who presents 10/14 from [facility], where he was admitted 9/28 after an unresponsive episode in jail. Found to have large SDH s/p L frontoparietal craniotomy with evacuation of SDH and drain placement 9/28 (removed 9/29). Followed by SLP for cognitive-linguistic goals and on a regular diet prior to transfer. Transferred to [facility] for further management of craniotomy and flab. S/p cranioplasty with bone flap replacement 10/27. Change in status thought to be related to Klonopin  administration 10/28, repeat CTH shows significant increase in size of mixed attenuation subdural fluid collection underlying the L tempoparietal craniotomy site, now measuring approximately 15 mm in thickness, with associated mild mass effect and slight rightward midline shift s/p L frontal burr hole for evacuation 10/30. Underwent L bone flap removal 11/10 after developing SDH s/p burr hole evacuation followed by swelling and suspected infection.      SLP Plan  Continue with current plan of care          Recommendations                         Frequent or constant  Supervision/Assistance Cognitive communication deficit (R41.841);Dysphagia, unspecified (R13.10)     Continue with current plan of care     Damien GORMAN Blumenthal  08/13/2024, 4:47 PM
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
## 2024-08-13 NOTE — Plan of Care (Signed)
  Problem: Education: Goal: Knowledge of General Education information will improve Description: Including pain rating scale, medication(s)/side effects and non-pharmacologic comfort measures Outcome: Progressing   Problem: Health Behavior/Discharge Planning: Goal: Ability to manage health-related needs will improve Outcome: Progressing   Problem: Clinical Measurements: Goal: Ability to maintain clinical measurements within normal limits will improve Outcome: Progressing Goal: Will remain free from infection Outcome: Progressing Goal: Diagnostic test results will improve Outcome: Progressing Goal: Respiratory complications will improve Outcome: Progressing Goal: Cardiovascular complication will be avoided Outcome: Progressing   Problem: Activity: Goal: Risk for activity intolerance will decrease Outcome: Progressing   Problem: Nutrition: Goal: Adequate nutrition will be maintained Outcome: Progressing   Problem: Coping: Goal: Level of anxiety will decrease Outcome: Progressing   Problem: Elimination: Goal: Will not experience complications related to bowel motility Outcome: Progressing Goal: Will not experience complications related to urinary retention Outcome: Progressing   Problem: Pain Managment: Goal: General experience of comfort will improve and/or be controlled Outcome: Progressing   Problem: Safety: Goal: Ability to remain free from injury will improve Outcome: Progressing   Problem: Skin Integrity: Goal: Risk for impaired skin integrity will decrease Outcome: Progressing   Problem: Education: Goal: Knowledge of the prescribed therapeutic regimen will improve Outcome: Progressing   Problem: Clinical Measurements: Goal: Usual level of consciousness will be regained or maintained. Outcome: Progressing Goal: Neurologic status will improve Outcome: Progressing Goal: Ability to maintain intracranial pressure will improve Outcome: Progressing    Problem: Skin Integrity: Goal: Demonstration of wound healing without infection will improve Outcome: Progressing

## 2024-08-13 NOTE — Progress Notes (Signed)
   08/13/24 1532  Vitals  Temp (!) 97.5 F (36.4 C)  Temp Source Axillary  BP 104/76  MAP (mmHg) 84  BP Location Left Arm  BP Method Automatic  Patient Position (if appropriate) Lying  Pulse Rate 66  Pulse Rate Source Monitor  ECG Heart Rate 65  Resp 16  Level of Consciousness  Level of Consciousness Responds to Voice  MEWS COLOR  MEWS Score Color Green  Oxygen Therapy  SpO2 98 %  O2 Device Room Air  MEWS Score  MEWS Temp 0  MEWS Systolic 0  MEWS Pulse 0  MEWS RR 0  MEWS LOC 1  MEWS Score 1   Pt transferred to the unit from 4NICU to 4NP-11. PT is Ax0x2 . PT VSS and  assessment completed. Pt is oriented to the unit. Pt pain level 0/10 on arrival. Two person skin assessment completed with Arley DEL RN. Tele applied and CCMD called. Call-bell with in reach and bed in lowest position and bed alarm on.

## 2024-08-13 NOTE — Progress Notes (Addendum)
 NAME:  Ernest Romero, MRN:  [id number], DOB:  [birth year], LOS: 29 ADMISSION DATE:  07/15/2024, CONSULTATION DATE: 07/29/2024 REFERRING MD:  JONELLE Sable MD, CHIEF COMPLAINT:   Postcraniotomy and bone flap  History of Present Illness:  58 year old male with past medical history of alcohol abuse, bipolar disorder, panic disorder who presented to [facility] from jail in September for large subdural hematoma s/p left frontal craniotomy and evacuation of bleed. He was transferred from [facility] to [facility] for flap replacement. While at [facility], patient was on alcohol withdrawal taper of phenobarbital which was discontinued prior to transfer.   Pertinent  Medical History    has a past medical history of Anxiety, Bipolar 1 disorder (HCC), Depression, Hepatitis, and Hypertension.   Significant Hospital Events: Including procedures, antibiotic start and stop dates in addition to other pertinent events   10/27 crani w/ flap replacement 10/29: off clevi, liberate SBP goal, Klonopin , lithium , Haldol  as needed discontinued.  Latuda  dose cut by half 10/30: repeat CT head with increased collection - going to OR this afternoon for evacuation 10/31: OR yesterday for SDH evacuation.  Developed arm twitching.  Klonopin  restarted at half dose 11/1: More awake.  No complaint 11/2 transferred out of ICU. 11/4 cortrak removed 11/8 CT head showed fluid collection under bone flap, decision made to remove bone flap 11/10 to OR for L bone flap removal 11/11 repeat CT head stable  Interim History / Subjective:  Doing well. NAEON. Tolerated diet this AM. Last BM appears 11/5.  Objective    Blood pressure 113/81, pulse 67, temperature 98.3 F (36.8 C), temperature source Axillary, resp. rate 15, height 5' 10 (1.778 m), weight 66.8 kg, SpO2 100%.        Intake/Output Summary (Last 24 hours) at 08/13/2024 1016 Last data filed at 08/13/2024 0900 Gross per 24 hour  Intake 1646.12 ml  Output 1702 ml  Net -55.88 ml   Filed  Weights   08/12/24 0500 08/12/24 0824 08/13/24 0702  Weight: 65 kg 65 kg 66.8 kg    Examination: General: Adult male, resting in bed, in NAD. Neuro: Awake but slow to respond, just received pain meds.SABRA HEENT: Scalp dressings and drains intact. MMM. Cardiovascular: RRR, no M/R/G.  Lungs: Respirations even and unlabored.  CTA bilaterally, No W/R/R. Abdomen: BS x 4, soft, NT/ND.  Musculoskeletal: No gross deformities, no edema.    Assessment and Plan   Subdural hematoma s/p initial craniotomy 9/28 (Dr. Loa poor barr) followed by skull flap left cranioplasty 10/27 (Dr. Sable). Unfortunately complicated by infected bone flap which is now s/p removal 11/10. - Post op care per Dr. Sable with NSGY. - Abx per ID, vanc/ceftazadime. Appreciate the assistance. - Follow cultures. - Keppra. - Tylenol , Fioricet, low dose Fentanyl  PRN - PT/OT when able.  Impaired mental capacity - 2/2 above. - Per [facility] notes, likely needs placement for guardianship in the long term.  Bipolar disorder - Continue Lurasidone , supportive care. - Hold PTA Lithium  for now, resume in next day or two.  History of alcohol, tobacco abuse - thiamine , folic acid , multivitamin  - Continue PTA Klonopin .  HTN - continue amlodipine , losartan  Protein calorie malnutrition. - RD following.  Constipation - last BM 11/5. - Continue Colace scheduled and PRN, Senna scheduled change from 1 to 2 tabs. - Change Miralax from PRN to daily.   Stable for transfer out of ICU from PCCM standpoint if OK with Dr. Janjua. TR[facility] to pickup tomorrow 11/12.    Ilir Mahrt, [place] -  [facility] For pager details, please see AMION or use Epic chat  After 1900, please call [facility] for cross coverage needs 08/13/2024, 10:16 AM

## 2024-08-14 ENCOUNTER — Encounter: Payer: MEDICAID | Admitting: Neurosurgery

## 2024-08-14 DIAGNOSIS — K5901 Slow transit constipation: Secondary | ICD-10-CM

## 2024-08-14 LAB — VANCOMYCIN, TROUGH: Vancomycin Tr: 9 ug/mL — ABNORMAL LOW (ref 15–20)

## 2024-08-14 MED ORDER — VANCOMYCIN HCL IN DEXTROSE 1-5 GM/200ML-% IV SOLN
1000.0000 mg | INTRAVENOUS | Status: AC
  Administered 2024-08-14: 1000 mg via INTRAVENOUS
  Filled 2024-08-14: qty 200

## 2024-08-14 MED ORDER — MILK AND MOLASSES ENEMA
1.0000 | Freq: Once | RECTAL | Status: AC
  Administered 2024-08-15: 150 mL via RECTAL
  Filled 2024-08-14: qty 150

## 2024-08-14 MED ORDER — VANCOMYCIN HCL IN DEXTROSE 1-5 GM/200ML-% IV SOLN
1000.0000 mg | Freq: Three times a day (TID) | INTRAVENOUS | Status: DC
  Administered 2024-08-14 – 2024-08-16 (×6): 1000 mg via INTRAVENOUS
  Filled 2024-08-14 (×7): qty 200

## 2024-08-14 MED ORDER — HALOPERIDOL LACTATE 5 MG/ML IJ SOLN
2.0000 mg | Freq: Four times a day (QID) | INTRAMUSCULAR | Status: DC | PRN
  Administered 2024-08-15 – 2024-08-21 (×6): 2 mg via INTRAVENOUS
  Filled 2024-08-14 (×6): qty 1

## 2024-08-14 MED ORDER — LEVETIRACETAM 500 MG PO TABS
500.0000 mg | ORAL_TABLET | Freq: Two times a day (BID) | ORAL | Status: AC
  Administered 2024-08-14 – 2024-08-18 (×10): 500 mg via ORAL
  Filled 2024-08-14 (×10): qty 1

## 2024-08-14 NOTE — Progress Notes (Signed)
 PROGRESS NOTE   Ernest Romero  FMW:[id number]    DOB: [birth year]    DOA: 07/15/2024  PCP: [name]   I have briefly reviewed patients previous medical records in [facility].   Brief Hospital Course:  PCCM Xfer 21/48. 58 year old male, homeless, medical history significant for alcohol use disorder, bipolar and panic disorder, initially presented to [facility] from jail in September 2025 with a large subdural hematoma.  Underwent craniotomy with flap replacement on 10/27.  Since then multiple OR visits including 10/30 for SDH evacuation, follow-up CT head 11/8 showed fluid collection underneath the bone flap, back to OR on 11/10 and the bone flap was removed, cultures were obtained, intraoperative cultures grew MRSA, treating for infected bone flap, ID on board and currently on IV vancomycin.  Referred to PCCM progress note from 11/11 for detail tabulation of hospital events until then.   Assessment & Plan:   Subdural hematoma  S/P initial craniotomy 9/28 (Dr. Loa poor barr)  S/P skull flap left cranioplasty 10/27 (Dr. Rosslyn).  Unfortunately complicated by MRSA infected bone flap which is now s/p removal 11/10. - Post op care per Dr. Rosslyn with NSGY. - Abx per ID, vancomycin IV - Keppra. - Tylenol , Fioricet, low dose Fentanyl  PRN - PT/OT when able.  Therapies recommend SNF.   Acute metabolic encephalopathy Per prior review of Dr. Rosario, [facility] MD note from 11/10, patient unable to make decisions about medical treatment due to impaired capacity, secondary to neurosurgical issues noted above and multifactorial. At baseline, patient reportedly without cognitive impairment, was homeless and independent caring for self. Patient's support counselor Larnell [name] has been visiting intermittently. [phone]).  Per information available to medical team, he does not have kids, his spouse passed away. Has sister, estranged.  Also per Dr. Donn note from 7/10, given  his current deficits in planning/executive function/memory, patient is likely to need guardianship for decisions i.e. placement. Receiving as needed Haldol  for agitation, consider reducing dose given significant sedation this morning.   Bipolar disorder - Continue Lurasidone , supportive care. - Hold PTA Lithium  for now, resume in next day or two.   History of alcohol, tobacco use disorder - thiamine , folic acid , multivitamin  - Continue PTA Klonopin . Treated for alcohol withdrawal delirium with phenobarbital taper at [facility].  Alcohol withdrawal resolved.   Essential HTN - continue amlodipine , losartan.  Reasonably controlled.  Constipation - last BM 11/5. - Continue Colace scheduled and PRN, Senna scheduled change from 1 to 2 tabs. - Change Miralax from PRN to daily. Ordered an enema 11/12.  Nutrition Status: Nutrition Problem: Severe Malnutrition Etiology: acute illness Signs/Symptoms: energy intake < or equal to 50% for > or equal to 5 days, severe muscle depletion, moderate fat depletion Interventions: Tube feeding Agree with dietitian evaluation and management.  Body mass index is 21.13 kg/m.   DVT prophylaxis: heparin injection 5,000 Units Start: 08/13/24 1400 SCDs Start: 08/12/24 1150 Place TED hose Start: 08/12/24 1150 SCDs Start: 08/01/24 1745 Place and maintain sequential compression device Start: 07/29/24 1819     Code Status: Full Code:  Family Communication: None at bedside. Disposition:  Status is: Inpatient Remains inpatient appropriate because: Recent surgery, ongoing AMS, including requiring Haldol  this morning.     Consultants:   PCCM Neurology Neurosurgery Psychiatry Infectious disease  Procedures:   As noted above  Subjective:  Patient is sedated/somnolent and unable to provide any history.  As per RN, Haldol  at 6:10 AM, 5 mg IV for attempt  to get out of bed excetra.  Objective:   Vitals:   08/13/24 2300 08/13/24 2326 08/14/24 0305 08/14/24  0800  BP:  100/74 116/70 (!) 124/94  Pulse: 60 (!) 59 63 (!) 55  Resp: 15 16 (!) 24 16  Temp:  98.2 F (36.8 C) 97.9 F (36.6 C) (!) 97.5 F (36.4 C)  TempSrc:  Axillary Oral Oral  SpO2: 99% 97% 99% 99%  Weight:      Height:        General exam: Young male, moderately built and nourished lying comfortably propped up in bed without distress. Respiratory system: Clear to auscultation. Respiratory effort normal. Cardiovascular system: S1 & S2 heard, RRR. No JVD, murmurs, rubs, gallops or clicks. No pedal edema.  Telemetry personally reviewed at bedside: Sinus bradycardia in the high 50s. Gastrointestinal system: Abdomen is nondistended, soft and nontender. No organomegaly or masses felt. Normal bowel sounds heard. Central nervous system: Sedated/somnolent, 2 noxious stimuli will briefly move head and neck.  No eye-opening or verbal response. No focal neurological deficits.  Pupils equally reacting to light and accommodation.  No nystagmus. Extremities: Not moving limbs currently. Skin: No rashes, lesions or ulcers Psychiatry: Judgement and insight cannot be assessed at this time. Mood & affect: Cannot be assessed at this time.  Scalp: Has sutures from recent craniotomy, intact without acute findings.  Has 2 drains with minimal bloody fluid in bulbs.    Data Reviewed:   I have personally reviewed following labs and imaging studies   CBC: Recent Labs  Lab 08/08/24 0242 08/09/24 0121 08/13/24 0254  WBC 10.6* 9.3 9.1  HGB 13.2 12.9* 11.6*  HCT 39.1 38.6* 36.3*  MCV 88.1 87.9 92.8  PLT 360 344 316    Basic Metabolic Panel: Recent Labs  Lab 08/08/24 0242 08/09/24 0121 08/10/24 1741 08/13/24 0254  NA 139 137 138 138  K 3.9 3.7 4.2 4.5  CL 105 103 102 108  CO2 23 23 25  19*  GLUCOSE 95 99 110* 82  BUN 15 13 14 19   CREATININE 0.90 0.83 0.97 1.02  CALCIUM 9.0 9.1 9.3 8.9  PHOS  --   --  4.5  --     Liver Function Tests: Recent Labs  Lab 08/09/24 0121 08/10/24 1741   AST 38  --   ALT 77*  --   ALKPHOS 75  --   BILITOT 0.4  --   PROT 6.4*  --   ALBUMIN 3.5 3.5    CBG: Recent Labs  Lab 08/13/24 0736  GLUCAP 118*    Microbiology Studies:   Recent Results (from the past 240 hours)  Aerobic/Anaerobic Culture w Gram Stain (surgical/deep wound)     Status: None (Preliminary result)   Collection Time: 08/12/24  9:54 AM   Specimen: Wound; Tissue  Result Value Ref Range Status   Specimen Description WOUND  Final   Special Requests LEFT  Final   Gram Stain   Final    RARE WBC PRESENT, PREDOMINANTLY PMN NO ORGANISMS SEEN Performed at [facility], [address], [place], [place] [address]    Culture   Final    ABUNDANT METHICILLIN RESISTANT STAPHYLOCOCCUS AUREUS   Report Status PENDING  Incomplete   Organism ID, Bacteria METHICILLIN RESISTANT STAPHYLOCOCCUS AUREUS  Final      Susceptibility   Methicillin resistant staphylococcus aureus - MIC*    CIPROFLOXACIN >=8 RESISTANT Resistant     ERYTHROMYCIN >=8 RESISTANT Resistant     GENTAMICIN <=0.5 SENSITIVE Sensitive  OXACILLIN >=4 RESISTANT Resistant     TETRACYCLINE <=1 SENSITIVE Sensitive     VANCOMYCIN 1 SENSITIVE Sensitive     TRIMETH /SULFA  80 RESISTANT Resistant     CLINDAMYCIN  <=0.25 SENSITIVE Sensitive     RIFAMPIN <=0.5 SENSITIVE Sensitive     Inducible Clindamycin  NEGATIVE Sensitive     LINEZOLID 2 SENSITIVE Sensitive     * ABUNDANT METHICILLIN RESISTANT STAPHYLOCOCCUS AUREUS  Aerobic/Anaerobic Culture w Gram Stain (surgical/deep wound)     Status: None (Preliminary result)   Collection Time: 08/12/24  9:55 AM   Specimen: Wound; Tissue  Result Value Ref Range Status   Specimen Description WOUND  Final   Special Requests LEFT  Final   Gram Stain   Final    NO WBC SEEN NO ORGANISMS SEEN Performed at [facility], [address], [place], [place] [address]    Culture   Final    MODERATE METHICILLIN RESISTANT STAPHYLOCOCCUS AUREUS   Report Status PENDING   Incomplete   Organism ID, Bacteria METHICILLIN RESISTANT STAPHYLOCOCCUS AUREUS  Final      Susceptibility   Methicillin resistant staphylococcus aureus - MIC*    CIPROFLOXACIN >=8 RESISTANT Resistant     ERYTHROMYCIN >=8 RESISTANT Resistant     GENTAMICIN <=0.5 SENSITIVE Sensitive     OXACILLIN >=4 RESISTANT Resistant     TETRACYCLINE <=1 SENSITIVE Sensitive     VANCOMYCIN 1 SENSITIVE Sensitive     TRIMETH /SULFA  80 RESISTANT Resistant     CLINDAMYCIN  <=0.25 SENSITIVE Sensitive     RIFAMPIN <=0.5 SENSITIVE Sensitive     Inducible Clindamycin  NEGATIVE Sensitive     LINEZOLID 2 SENSITIVE Sensitive     * MODERATE METHICILLIN RESISTANT STAPHYLOCOCCUS AUREUS    Radiology Studies:  CT HEAD WO CONTRAST Result Date: 08/13/2024 EXAM: CT HEAD WITHOUT CONTRAST 08/13/2024 04:18:00 AM TECHNIQUE: CT of the head was performed without the administration of intravenous contrast. Automated exposure control, iterative reconstruction, and/or weight based adjustment of the mA/kV was utilized to reduce the radiation dose to as low as reasonably achievable. COMPARISON: CT Head dated 08/09/2024 and earlier. CLINICAL HISTORY: Postop. 58 year old male with history of left sided subdural hematoma decompressive hemicraniectomy recently status post artificial bone flap replacement but recurrent subdural hematoma now status post operative day 1 removal of suspected infected left bone flap. FINDINGS: BRAIN AND VENTRICLES: No acute hemorrhage. No evidence of acute infarct. No hydrocephalus. Virtually resolved biconvex mixed density extra axial fluid collection seen on 08/09/2024. Small volume if any low density abnormal left hemisphere extra axial fluid now (2 mm on coronal image 45). Largely resolved mass effect on the left lateral ventricle. No midline shift. No pneumocephalus. No hyperdense intracranial hemorrhage. Gray white differentiation stable and within normal limits. Basilar cisterns appear normal. No suspicious  intracranial vascular hyperdensity. ORBITS: No acute abnormality. SINUSES: Left middle ear and mastoid opacification is stable. Other paranasal sinuses, right middle ear and mastoids remain better aerated. SOFT TISSUES AND SKULL: Interval repeat left hemicraniectomy with removal of left bone flap. Osteotomy margins appear within normal limits. Interval repeat left hemicraniectomy with removal of left bone flap. Several percutaneous drains are in place, appear to be external to the dura. Osteotomy margins appear within normal limits. Left scalp convexity broad based soft tissue swelling. Small volume scalp postoperative gas. IMPRESSION: 1. Satisfactory CT brain appearance on postoperative day 1 from left bone flap removal. 2. Small if any residual left hemisphere extra axial fluid collection (2 mm). 3. Stable left middle ear and  mastoid opacification, otitis media not excluded. Electronically signed by: Helayne Hurst MD 08/13/2024 04:47 AM EST RP Workstation: [id number]    Scheduled Meds:    amLODipine   10 mg Oral Daily   Chlorhexidine Gluconate Cloth  6 each Topical Daily   clonazePAM   1 mg Oral BID   docusate  100 mg Per Tube Daily   feeding supplement  237 mL Oral BID BM   folic acid   1 mg Oral Daily   gabapentin  200 mg Oral TID   heparin injection (subcutaneous)  5,000 Units Subcutaneous Q8H   levETIRAcetam  500 mg Intravenous Q12H   losartan  50 mg Oral Daily   lurasidone   40 mg Oral QHS   multivitamin with minerals  1 tablet Oral Daily   nicotine  14 mg Transdermal Daily   pantoprazole (PROTONIX) IV  40 mg Intravenous QHS   polyethylene glycol  17 g Oral Daily   senna  2 tablet Oral Daily   sodium chloride  flush  10-40 mL Intracatheter Q12H   thiamine  (VITAMIN B1) injection  100 mg Intravenous Q24H    Continuous Infusions:    thiamine  (VITAMIN B1) injection 500 mg (08/14/24 0548)   vancomycin       LOS: 30 days     Trenda Mar, MD,  FACP, [facility], [facility], [facility]   [facility]  Hospitalist & Physician Advisor [place]      To contact the attending provider between 7A-7P or the covering provider during after hours 7P-7A, please log into the web site [email or web address] and access using universal [place] password for that web site. If you do not have the password, please call the hospital operator.  08/14/2024, 9:43 AM

## 2024-08-14 NOTE — Progress Notes (Signed)
 Pharmacy Antibiotic Note  Ernest Romero is a 58 y.o. male admitted on 07/15/2024 with bone flap infection s/p cranioplasty.  Pharmacy has been consulted for vancomycin dosing.  Vanc trough 9 (drawn early, true trough 7.6), below goal.  Plan: Change vancomycin to 1000mg  IV every 8 hours, calculated trough 19 and AUC 530.  Goal trough 15-20 mcg/mL.  Height: 5' 10 (177.8 cm) Weight: 66.8 kg (147 lb 4.3 oz) IBW/kg (Calculated) : 73  Temp (24hrs), Avg:98.1 F (36.7 C), Min:97.5 F (36.4 C), Max:98.7 F (37.1 C)  Recent Labs  Lab 08/08/24 0242 08/09/24 0121 08/10/24 1741 08/13/24 0254 08/13/24 2311  WBC 10.6* 9.3  --  9.1  --   CREATININE 0.90 0.83 0.97 1.02  --   VANCOTROUGH  --   --   --   --  9*    Estimated Creatinine Clearance: 72.8 mL/min (by C-G formula based on SCr of 1.02 mg/dL).    No Known Allergies   Thank you for allowing pharmacy to be a part of this patient's care.  Marvetta Dauphin, PharmD, BCPS  08/14/2024 12:26 AM

## 2024-08-14 NOTE — Progress Notes (Signed)
 Chaplain responded to spiritual consult for emotional/spiritual support. Pt Ernest Romero was sleeping but awoke to my voice. He stated he was in pain and ready to get out of here, and that he can't keep living like this. He was too drowsy to continue the conversation for long and indicated he didn't have any other needs at this time, but I assured him I would pass on his pain concerns to the RN per his request. Following the visit, I did so.  Chaplains remain available as further needs arise.
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
## 2024-08-14 NOTE — Progress Notes (Signed)
 Nutrition Follow-up  DOCUMENTATION CODES:   Severe malnutrition in context of acute illness/injury  INTERVENTION:  Encourage PO intake at meals Pt prefers bites sized solids, continue DYS3 diet  Room service with assist  Ensure Plus High Protein po BID, each supplement provides 350 kcal and 20 grams of protein. Continue MVI, Thiamine , Folic acid  supplementation   NUTRITION DIAGNOSIS:   Severe Malnutrition related to acute illness as evidenced by energy intake < or equal to 50% for > or equal to 5 days, severe muscle depletion, moderate fat depletion. - Ongoing   GOAL:   Patient will meet greater than or equal to 90% of their needs - Progressing   MONITOR:   TF tolerance, Labs, Weight trends  REASON FOR ASSESSMENT:   Malnutrition Screening Tool, NPO/Clear Liquid Diet    ASSESSMENT:   PMH significant for ETOH abuse, HTN, bipolar disorder, anxiety/depression, hepatitis, craniotomy 06/30/24 for SDH during incarceration, presented on 10/13 for management of craniotomy s/p craniotomy with flap replacement 10/27.   9/28 - admitted to [facility] with SDH s/p post prontoparietal craniotomy with evacuation 10/13 - tx to [facility] 10/27 - s/p L allograft cranioplasty with flap replacement 10/29 - cortrak tube placed; xray with tip in descending duodenum  10/31 - to OR for SDH evacuation; diet advanced to Dysphagia 3; eating 75% of meals 11/2 - Tube feeds discontinued  11/3 - Transferred to progressive  11/4 - Cortrak removed 11/10 - OR for L bone flap removal, transferred to ICU 11/11 - Transferred back to progressive   Pt continues to do well with po intake, eating 75-100% of his meals. meals, drinking Ensures. Speech evaluated pt and recommended regular texture diet however pt preferred bite sized solids, continue DYS3 diet. Weight stable. Of note last BM was 11/5, MD increase bowel regimen.   Admit weight: 67.5 kg Current weight: 66.8 kg   Average Meal Intake: 10/31-11/3: 75% intake x  4 recorded meals 11/2-11/11: 73% intake x 7 recorded meals   Nutritionally Relevant Medications: Scheduled Meds:  docusate  100 mg Per Tube Daily   feeding supplement  237 mL Oral BID BM   folic acid   1 mg Oral Daily   multivitamin with minerals  1 tablet Oral Daily   senna  2 tablet Oral Daily   thiamine  (VITAMIN B1) injection  100 mg Intravenous Q24H   Continuous Infusions:  vancomycin 1,000 mg (08/14/24 0948)   Labs Reviewed: CBG ranges from 82-110 mg/dL over the last 24 hours   Diet Order:   Diet Order             DIET DYS 3 Room service appropriate? Yes; Fluid consistency: Thin  Diet effective now                   EDUCATION NEEDS:   Not appropriate for education at this time  Skin:  Skin Assessment: Skin Integrity Issues: Skin Integrity Issues:: Incisions Incisions: closed surgical incision to head  Last BM:  11/5  Height:   Ht Readings from Last 1 Encounters:  08/12/24 5' 10 (1.778 m)    Weight:   Wt Readings from Last 1 Encounters:  08/13/24 66.8 kg    Ideal Body Weight:  75.5 kg  BMI:  Body mass index is 21.13 kg/m.  Estimated Nutritional Needs:   Kcal:  2100-2400  Protein:  110-130  Fluid:  2100-2400   Ernest Romero, RD Registered Dietitian  See Amion for more information

## 2024-08-14 NOTE — Progress Notes (Signed)
 Occupational Therapy Treatment Patient Details Name: Ernest Romero MRN: [id number] DOB: [birth year] Today's Date: 08/14/2024   History of present illness Pt is a 58 y.o. male presenting 10/14 from [facility] where he was admitted 9/[facility] for [facility] episode in the jail. 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 On CIWA protocol. Transferred to [facility] for further management of craniotomy and flap. 10/27 s/p cranioplasty with bone flap replacement. 10/28 Change in status thought to be related to Klonopin  administration 10/28, repeat CTH shows significant increase in size of mixed attenuation subdural fluid collection underlying the L tempoparietal craniotomy site, now measuring approximately 15 mm in thickness, with associated mild mass effect and slight rightward midline shift. OR on 10/30 for SDH evacuation. [facility] 11/7 with fluid collected under bone flap. 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 PMH alcohol use disorder, psychiatric disorder, HTN   OT comments  Pt supine in bed, lethargic but cleared to participate with RN.  Patient verbalizing supine but not opening eyes, total assist +2 to sit EOB and pt becoming more alert/opening eyes.  Noted R eye opening fully and L eye opening partially.  He is able to follow some simple 1 step commands but not consistently.  Sitting EOB with posterior and R lateral lean, needing min to mod assist to maintain midline. Pt voicing plan to get out of here and go to the store multiple times during session, aware he is in the hospital and able to correct month when cued (initially reports October).  Limited session, at this time requires max-total assist for all ADLs.  Returned back to bed, bed alarm on and RN in room upon therapist exit.  Will extend goals, OT to re-assess next session.  Continue to recommend <3 hrs/day inpatient setting at dc.       If plan is discharge  home, recommend the following:  Supervision due to cognitive status;Direct supervision/assist for medications management;Assistance with cooking/housework;Direct supervision/assist for financial management;Assist for transportation;Assistance with feeding;Two people to help with walking and/or transfers;Two people to help with bathing/dressing/bathroom   Equipment Recommendations  Other (comment) (defer)    Recommendations for Other Services      Precautions / Restrictions Precautions Precautions: Fall Recall of Precautions/Restrictions: Impaired Precaution/Restrictions Comments: SBP <160, helmet (pt to wear when OOB per RN) Restrictions Weight Bearing Restrictions Per Provider Order: No       Mobility Bed Mobility Overal bed mobility: Needs Assistance Bed Mobility: Supine to Sit, Sit to Supine     Supine to sit: Total assist, +2 for safety/equipment, HOB elevated, +2 for physical assistance Sit to supine: Max assist, +2 for safety/equipment, HOB elevated   General bed mobility comments: pt lethargic therefore total assist to pivot to EOB with RN assist, returned back to supine with max assist +2 safety    Transfers                   General transfer comment: deferred due to lethargy     Balance Overall balance assessment: Needs assistance Sitting-balance support: Feet supported, Bilateral upper extremity supported Sitting balance-Leahy Scale: Poor Sitting balance - Comments: posterior bias, R lateral lean; min to mod assist required Postural control: Posterior lean, Right lateral lean                                 ADL either  performed or assessed with clinical judgement   ADL Overall ADL's : Needs assistance/impaired                 Upper Body Dressing : Total assistance;Sitting   Lower Body Dressing: Total assistance;Bed level;Sitting/lateral leans               Functional mobility during ADLs: Maximal assistance;Total  assistance;+2 for safety/equipment      Extremity/Trunk Assessment Upper Extremity Assessment Upper Extremity Assessment: Generalized weakness            Vision   Additional Comments: R eye opening more than L eye once sitting upright at EOB   Perception     Praxis     Communication Communication Communication: Impaired Factors Affecting Communication: Difficulty expressing self   Cognition Arousal: Lethargic Behavior During Therapy: Flat affect Cognition: Cognition impaired Difficult to assess due to: Level of arousal Orientation impairments: Situation, Time Awareness: Intellectual awareness intact Memory impairment (select all impairments): Short-term memory, Declarative long-term memory, Non-declarative long-term memory, Working memory Attention impairment (select first level of impairment): Sustained attention Executive functioning impairment (select all impairments): Organization, Sequencing, Reasoning, Problem solving, Initiation OT - Cognition Comments: pt lethargic, likely due to medications, more alert when upright at EOB but slow processing and decreased awareness. He reports at the hospital, voices october (able to correct when cued to the next month), and reports several times needing to get out of here to go to the store                 Following commands: Impaired Following commands impaired: Follows one step commands inconsistently, Follows one step commands with increased time      Cueing   Cueing Techniques: Verbal cues  Exercises      Shoulder Instructions       General Comments VSS, RN present during session due to pts lethargy    Pertinent Vitals/ Pain       Pain Assessment Pain Assessment: Faces Faces Pain Scale: No hurt Pain Intervention(s): Monitored during session  Home Living                                          Prior Functioning/Environment              Frequency  Min 2X/week         Progress Toward Goals  OT Goals(current goals can now be found in the care plan section)  Progress towards OT goals: Not progressing toward goals - comment;OT to reassess next treatment  Acute Rehab OT Goals OT Goal Formulation: Patient unable to participate in goal setting Time For Goal Achievement: 08/28/24 Potential to Achieve Goals: Fair  Plan      Co-evaluation                 AM-PAC OT 6 Clicks Daily Activity     Outcome Measure   Help from another person eating meals?: Total Help from another person taking care of personal grooming?: A Lot Help from another person toileting, which includes using toliet, bedpan, or urinal?: Total Help from another person bathing (including washing, rinsing, drying)?: A Lot Help from another person to put on and taking off regular upper body clothing?: A Lot Help from another person to put on and taking off regular lower body clothing?: Total 6 Click Score: 9    End of Session  OT Visit Diagnosis: Other abnormalities of gait and mobility (R26.89);Muscle weakness (generalized) (M62.81);Other symptoms and signs involving the nervous system (R29.898);Cognitive communication deficit (R41.841);Other symptoms and signs involving cognitive function   Activity Tolerance Patient limited by lethargy   Patient Left in bed;with call bell/phone within reach;with bed alarm set;with nursing/sitter in room   Nurse Communication Mobility status;Precautions        Time: 8891-8878 OT Time Calculation (min): 13 min  Charges: OT General Charges $OT Visit: 1 Visit OT Treatments $Therapeutic Activity: 8-22 mins  Etta NOVAK, OT Acute Rehabilitation Services Office [phone] Secure Chat Preferred    Etta GORMAN Hope 08/14/2024, 1:08 PM

## 2024-08-14 NOTE — Progress Notes (Signed)
 This nurse attempted to administer oral medications to pt. Pt not opening eyes, responding to voice but falls back asleep. Medications not administered due to sluggishness. MD notified, no new orders.

## 2024-08-15 ENCOUNTER — Encounter (HOSPITAL_COMMUNITY): Payer: Self-pay | Admitting: Internal Medicine

## 2024-08-15 LAB — CBC
HCT: 32.7 % — ABNORMAL LOW (ref 39.0–52.0)
Hemoglobin: 11 g/dL — ABNORMAL LOW (ref 13.0–17.0)
MCH: 30.1 pg (ref 26.0–34.0)
MCHC: 33.6 g/dL (ref 30.0–36.0)
MCV: 89.6 fL (ref 80.0–100.0)
Platelets: 348 K/uL (ref 150–400)
RBC: 3.65 MIL/uL — ABNORMAL LOW (ref 4.22–5.81)
RDW: 12.4 % (ref 11.5–15.5)
WBC: 4.9 K/uL (ref 4.0–10.5)
nRBC: 0 % (ref 0.0–0.2)

## 2024-08-15 LAB — BASIC METABOLIC PANEL WITH GFR
Anion gap: 9 (ref 5–15)
BUN: 14 mg/dL (ref 6–20)
CO2: 22 mmol/L (ref 22–32)
Calcium: 9.1 mg/dL (ref 8.9–10.3)
Chloride: 109 mmol/L (ref 98–111)
Creatinine, Ser: 0.91 mg/dL (ref 0.61–1.24)
GFR, Estimated: >60 mL/min (ref >60)
Glucose, Bld: 95 mg/dL (ref 70–99)
Potassium: 3.6 mmol/L (ref 3.5–5.1)
Sodium: 140 mmol/L (ref 135–145)

## 2024-08-15 NOTE — Progress Notes (Signed)
 Speech Language Pathology Treatment: Cognitive-Linguistic  Patient Details Name: Ernest Romero MRN: [id number] DOB: [birth year] Today's Date: 08/15/2024 Time: 1032-1050 SLP Time Calculation (min) (ACUTE ONLY): 18 min  Assessment / Plan / Recommendation Clinical Impression  Pt is oriented to self and place but not to time or situation. When provided with a calendar and Mod cueing, he reoriented himself to time but could not recall this information throughout the session. Provided education on situation and safety, which pt could recall throughout the session, verbalizing the need to wear his helmet when out of bed. He completed self-care tasks, requiring Mod cueing for sequencing. Reinforcement will be necessary as carryover is not expected between visits. He continues to present with behaviors characteristic of [place] and may benefit from ongoing SLP interventions.    HPI HPI: 58 yo male with history of alcohol use disorder, psychiatric disorder, and HTN, who presents 10/14 from [facility], where he was admitted 9/28 after an unresponsive episode in jail. Found to have large SDH s/p L frontoparietal craniotomy with evacuation of SDH and drain placement 9/28 (removed 9/29). Followed by SLP for cognitive-linguistic goals and on a regular diet prior to transfer. Transferred to [facility] for further management of craniotomy and flab. S/p cranioplasty with bone flap replacement 10/27. Change in status thought to be related to Klonopin  administration 10/28, repeat CTH shows significant increase in size of mixed attenuation subdural fluid collection underlying the L tempoparietal craniotomy site, now measuring approximately 15 mm in thickness, with associated mild mass effect and slight rightward midline shift s/p L frontal burr hole for evacuation 10/30. Underwent L bone flap removal 11/10 after developing SDH s/p burr hole evacuation followed by swelling and suspected infection.      SLP Plan  Continue with  current plan of care          Recommendations                         Frequent or constant Supervision/Assistance Cognitive communication deficit (M58.158)     Continue with current plan of care     Damien Blumenthal, M.A., CCC-SLP Speech Language Pathology, Acute Rehabilitation Services  Secure Chat preferred [phone]   08/15/2024, 11:17 AM

## 2024-08-15 NOTE — Progress Notes (Signed)
 PROGRESS NOTE   Ernest Romero  FMW:[id number]    DOB: 08-31-1966    DOA: 07/15/2024  PCP: [name]   I have briefly reviewed patients previous medical records in [facility].   Brief Hospital Course:  PCCM Xfer 20/67. 58 year old male, homeless, medical history significant for alcohol use disorder, bipolar and panic disorder, initially presented to [facility] from jail in September 2025 with a large subdural hematoma.  Underwent craniotomy with flap replacement on 10/27.  Since then multiple OR visits including 10/30 for SDH evacuation, follow-up CT head 11/8 showed fluid collection underneath the bone flap, back to OR on 11/10 and the bone flap was removed, cultures were obtained, intraoperative cultures grew MRSA, treating for infected bone flap, ID on board and currently on IV vancomycin.  Referred to PCCM progress note from 11/11 for detail tabulation of hospital events until then.   Assessment & Plan:   Subdural hematoma  S/P initial craniotomy 9/28 (Dr. Loa poor barr)  S/P skull flap left cranioplasty 10/27 (Dr. Rosslyn).  Unfortunately complicated by MRSA infected bone flap which is now s/p removal 11/10. - Post op care per Dr. Rosslyn with NSGY. - Abx per ID, vancomycin IV.  They will make final recommendations following final culture results.  He will need PICC line and prolonged IV antibiotics. - Keppra. - Tylenol , Fioricet, low dose Fentanyl  PRN - PT/OT when able.  Therapies recommend SNF.   Acute metabolic encephalopathy Per prior review of Dr. Rosario, [facility] MD note from 11/10, patient unable to make decisions about medical treatment due to impaired capacity, secondary to neurosurgical issues noted above and multifactorial. At baseline, patient reportedly without cognitive impairment, was homeless and independent caring for self. Patient's support counselor Larnell [name] has been visiting intermittently. [phone]).  Per information available to  medical team, he does not have kids, his spouse passed away. Has sister, estranged.  Also per Dr. Donn note from 7/10, given his current deficits in planning/executive function/memory, patient is likely to need guardianship for decisions i.e. placement. Minimized as needed Haldol  dose for agitation on 11/12.  Mental status much improved.   Bipolar disorder - Continue Lurasidone , supportive care. - Lithium  was discontinued on 10/27 and has not been on this since then.  Unclear if he was taking this PTA.  Seems to be doing okay without it.  Continue to monitor off of lithium .   History of alcohol, tobacco use disorder - thiamine , folic acid , multivitamin  - Continue PTA Klonopin . Treated for alcohol withdrawal delirium with phenobarbital taper at [facility].  Alcohol withdrawal resolved.   Essential HTN - continue amlodipine , losartan.  Reasonably controlled.  Constipation - last BM 11/5. - Continue Colace scheduled and PRN, Senna scheduled change from 1 to 2 tabs. - Change Miralax from PRN to daily. Ordered an enema 11/12 but was not given.  Messaged RN to make sure that it is given today.  Nutrition Status: Nutrition Problem: Severe Malnutrition Etiology: acute illness Signs/Symptoms: energy intake < or equal to 50% for > or equal to 5 days, severe muscle depletion, moderate fat depletion Interventions: Tube feeding Agree with dietitian evaluation and management.  Body mass index is 20.5 kg/m.   DVT prophylaxis: heparin injection 5,000 Units Start: 08/13/24 1400 SCDs Start: 08/12/24 1150 Place TED hose Start: 08/12/24 1150 SCDs Start: 08/01/24 1745 Place and maintain sequential compression device Start: 07/29/24 1819     Code Status: Full Code:  Family Communication: Friend at bedside. Disposition:  Status  is: Inpatient Remains inpatient appropriate because: Recent surgery, ongoing AMS, including requiring Haldol  this morning.     Consultants:    PCCM Neurology Neurosurgery Psychiatry Infectious disease  Procedures:   As noted above  Subjective:  Sleeping but easily arousable and oriented x 3.  As per nursing, did well progressively since yesterday evening.  Alert and oriented x 4 this morning.  No acute issues reported.  Objective:   Vitals:   08/15/24 0500 08/15/24 0719 08/15/24 0800 08/15/24 1138  BP:  100/81 126/81 113/78  Pulse:  60 66 71  Resp:  16 19 16   Temp:  98.3 F (36.8 C)  98.1 F (36.7 C)  TempSrc:  Oral  Oral  SpO2:  95%  96%  Weight: 64.8 kg     Height:        General exam: Young male, moderately built and nourished lying comfortably propped up in bed without distress. Respiratory system: Clear to auscultation. Respiratory effort normal. Cardiovascular system: S1 & S2 heard, RRR. No JVD, murmurs, rubs, gallops or clicks. No pedal edema.  Telemetry personally reviewed at bedside: SB in the 50s-SR. Gastrointestinal system: Abdomen is nondistended, soft and nontender. No organomegaly or masses felt. Normal bowel sounds heard. Central nervous system: Mental status as noted above.  Much improved compared to yesterday. Extremities: Not moving limbs currently. Skin: No rashes, lesions or ulcers Psychiatry: Judgement and insight cannot be assessed at this time. Mood & affect: Cannot be assessed at this time.  Scalp: Has sutures from recent craniotomy, intact without acute findings.  Cranial drains were removed yesterday.   Data Reviewed:   I have personally reviewed following labs and imaging studies   CBC: Recent Labs  Lab 08/09/24 0121 08/13/24 0254 08/15/24 0330  WBC 9.3 9.1 4.9  HGB 12.9* 11.6* 11.0*  HCT 38.6* 36.3* 32.7*  MCV 87.9 92.8 89.6  PLT 344 316 348    Basic Metabolic Panel: Recent Labs  Lab 08/09/24 0121 08/10/24 1741 08/13/24 0254 08/15/24 0330  NA 137 138 138 140  K 3.7 4.2 4.5 3.6  CL 103 102 108 109  CO2 23 25 19* 22  GLUCOSE 99 110* 82 95  BUN 13 14 19 14    CREATININE 0.83 0.97 1.02 0.91  CALCIUM 9.1 9.3 8.9 9.1  PHOS  --  4.5  --   --     Liver Function Tests: Recent Labs  Lab 08/09/24 0121 08/10/24 1741  AST 38  --   ALT 77*  --   ALKPHOS 75  --   BILITOT 0.4  --   PROT 6.4*  --   ALBUMIN 3.5 3.5    CBG: Recent Labs  Lab 08/13/24 0736  GLUCAP 118*    Microbiology Studies:   Recent Results (from the past 240 hours)  Aerobic/Anaerobic Culture w Gram Stain (surgical/deep wound)     Status: None (Preliminary result)   Collection Time: 08/12/24  9:54 AM   Specimen: Wound; Tissue  Result Value Ref Range Status   Specimen Description WOUND  Final   Special Requests LEFT  Final   Gram Stain   Final    RARE WBC PRESENT, PREDOMINANTLY PMN NO ORGANISMS SEEN Performed at [facility], [address] [place], [place] [address]    Culture   Final    ABUNDANT METHICILLIN RESISTANT STAPHYLOCOCCUS AUREUS NO ANAEROBES ISOLATED; CULTURE IN PROGRESS FOR 5 DAYS    Report Status PENDING  Incomplete   Organism ID, Bacteria METHICILLIN RESISTANT STAPHYLOCOCCUS AUREUS  Final      Susceptibility   Methicillin resistant staphylococcus aureus - MIC*    CIPROFLOXACIN >=8 RESISTANT Resistant     ERYTHROMYCIN >=8 RESISTANT Resistant     GENTAMICIN <=0.5 SENSITIVE Sensitive     OXACILLIN >=4 RESISTANT Resistant     TETRACYCLINE <=1 SENSITIVE Sensitive     VANCOMYCIN 1 SENSITIVE Sensitive     TRIMETH /SULFA  80 RESISTANT Resistant     CLINDAMYCIN  <=0.25 SENSITIVE Sensitive     RIFAMPIN <=0.5 SENSITIVE Sensitive     Inducible Clindamycin  NEGATIVE Sensitive     LINEZOLID 2 SENSITIVE Sensitive     * ABUNDANT METHICILLIN RESISTANT STAPHYLOCOCCUS AUREUS  Aerobic/Anaerobic Culture w Gram Stain (surgical/deep wound)     Status: None (Preliminary result)   Collection Time: 08/12/24  9:55 AM   Specimen: Wound; Tissue  Result Value Ref Range Status   Specimen Description WOUND  Final   Special Requests LEFT  Final   Gram Stain   Final     NO WBC SEEN NO ORGANISMS SEEN Performed at [facility], [address], [place], [place] [address]    Culture   Final    MODERATE METHICILLIN RESISTANT STAPHYLOCOCCUS AUREUS NO ANAEROBES ISOLATED; CULTURE IN PROGRESS FOR 5 DAYS    Report Status PENDING  Incomplete   Organism ID, Bacteria METHICILLIN RESISTANT STAPHYLOCOCCUS AUREUS  Final      Susceptibility   Methicillin resistant staphylococcus aureus - MIC*    CIPROFLOXACIN >=8 RESISTANT Resistant     ERYTHROMYCIN >=8 RESISTANT Resistant     GENTAMICIN <=0.5 SENSITIVE Sensitive     OXACILLIN >=4 RESISTANT Resistant     TETRACYCLINE <=1 SENSITIVE Sensitive     VANCOMYCIN 1 SENSITIVE Sensitive     TRIMETH /SULFA  80 RESISTANT Resistant     CLINDAMYCIN  <=0.25 SENSITIVE Sensitive     RIFAMPIN <=0.5 SENSITIVE Sensitive     Inducible Clindamycin  NEGATIVE Sensitive     LINEZOLID 2 SENSITIVE Sensitive     * MODERATE METHICILLIN RESISTANT STAPHYLOCOCCUS AUREUS    Radiology Studies:  No results found.   Scheduled Meds:    amLODipine   10 mg Oral Daily   Chlorhexidine Gluconate Cloth  6 each Topical Daily   clonazePAM   1 mg Oral BID   docusate  100 mg Per Tube Daily   feeding supplement  237 mL Oral BID BM   folic acid   1 mg Oral Daily   gabapentin  200 mg Oral TID   heparin injection (subcutaneous)  5,000 Units Subcutaneous Q8H   levETIRAcetam  500 mg Oral BID   losartan  50 mg Oral Daily   lurasidone   40 mg Oral QHS   milk and molasses  1 enema Rectal Once   multivitamin with minerals  1 tablet Oral Daily   nicotine  14 mg Transdermal Daily   pantoprazole (PROTONIX) IV  40 mg Intravenous QHS   polyethylene glycol  17 g Oral Daily   senna  2 tablet Oral Daily   sodium chloride  flush  10-40 mL Intracatheter Q12H   thiamine  (VITAMIN B1) injection  100 mg Intravenous Q24H    Continuous Infusions:    vancomycin 1,000 mg (08/15/24 0800)     LOS: 31 days     Trenda Mar, MD,  FACP, [facility], [facility], [facility]    [facility] [place]      To contact the attending provider between 7A-7P or the covering provider during after hours 7P-7A, please log into the web site [email or web address]  and access using universal [place] password for that web site. If you do not have the password, please call the hospital operator.  08/15/2024, 1:30 PM

## 2024-08-15 NOTE — Progress Notes (Signed)
 Physical Therapy Treatment Patient Details Name: Ernest Romero MRN: [id number] DOB: [birth year] Today's Date: 08/15/2024   History of Present Illness Pt is a 58 y.o. male presenting 10/14 from [facility] where he was admitted 9/[facility] for [facility] episode in the jail. 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 On CIWA protocol. Transferred to [facility] for further management of craniotomy and flap. 10/27 s/p cranioplasty with bone flap replacement. 10/28 Change in status thought to be related to Klonopin  administration 10/28, repeat CTH shows significant increase in size of mixed attenuation subdural fluid collection underlying the L tempoparietal craniotomy site, now measuring approximately 15 mm in thickness, with associated mild mass effect and slight rightward midline shift. OR on 10/30 for SDH evacuation. [facility] 11/7 with fluid collected under bone flap. 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 PMH alcohol use disorder, psychiatric disorder, HTN    PT Comments  Pt sleeping upon arrival, wakes easily but remains with flat affect throughout session. Pt demonstrating good progression this date, ambulating 100 ft with steadying and max cuing assist. Chair follow utilized for safety. Pt with poor awareness in hallway, suspect given busier environment and distractions. Pt progressing well.      If plan is discharge home, recommend the following: A lot of help with bathing/dressing/bathroom;Assistance with cooking/housework;Direct supervision/assist for medications management;Direct supervision/assist for financial management;Assist for transportation;Help with stairs or ramp for entrance;Supervision due to cognitive status;A little help with walking and/or transfers   Can travel by private vehicle        Equipment Recommendations  Wheelchair (measurements PT);Wheelchair cushion (measurements PT);Rolling walker (2  wheels)    Recommendations for Other Services       Precautions / Restrictions Precautions Precautions: Fall Recall of Precautions/Restrictions: Impaired Precaution/Restrictions Comments: SBP <160, helmet (pt to wear when OOB per RN) Restrictions Weight Bearing Restrictions Per Provider Order: No     Mobility  Bed Mobility Overal bed mobility: Needs Assistance Bed Mobility: Supine to Sit     Supine to sit: Mod assist, HOB elevated, Used rails Sit to supine: Mod assist, +2 for physical assistance   General bed mobility comments: assist for trunk and LE management, scooting to/from EOB, repositioning upon return to supine.    Transfers Overall transfer level: Needs assistance Equipment used: Rolling walker (2 wheels) Transfers: Sit to/from Stand Sit to Stand: Min assist           General transfer comment: assist for rise and steady, posterior bias. Stand x2, from EOB    Ambulation/Gait Ambulation/Gait assistance: Min assist, +2 safety/equipment (chair follow) Gait Distance (Feet): 100 Feet Assistive device: Rolling walker (2 wheels) Gait Pattern/deviations: Step-through pattern, Decreased stride length, Narrow base of support, Trunk flexed Gait velocity: decr     General Gait Details: assist to steady, avoid obstacles as pt bumping into x2 objects with no awareness   Stairs             Wheelchair Mobility     Tilt Bed    Modified Rankin (Stroke Patients Only)       Balance Overall balance assessment: Needs assistance Sitting-balance support: Feet supported, Bilateral upper extremity supported Sitting balance-Leahy Scale: Poor Sitting balance - Comments: posterior bias, R lateral lean; min to mod assist required Postural control: Posterior lean, Right lateral lean Standing balance support: Bilateral upper extremity supported, Reliant on assistive device for balance Standing balance-Leahy Scale: Poor Standing balance comment: reliant on  RW  support                            Communication Communication Communication: Impaired Factors Affecting Communication: Difficulty expressing self  Cognition Arousal: Alert Behavior During Therapy: Flat affect   PT - Cognitive impairments: Awareness, Memory, Attention, Problem solving, Safety/Judgement, Initiation                   Rancho Levels of Cognitive Functioning Rancho Los Amigos Scales of Cognitive Functioning: Confused, Inappropriate Non-Agitated: Maximal Assistance Rancho Mirant Scales of Cognitive Functioning: Confused, Inappropriate Non-Agitated: Maximal Assistance [V] PT - Cognition Comments: sleeping upon arrival to room, when wakes pt awake but very flat affect. Following commands: Impaired Following commands impaired: Follows one step commands inconsistently, Follows one step commands with increased time    Cueing Cueing Techniques: Verbal cues  Exercises      General Comments        Pertinent Vitals/Pain Pain Assessment Pain Assessment: Faces Faces Pain Scale: Hurts a little bit Pain Location: head Pain Descriptors / Indicators: Discomfort Pain Intervention(s): Limited activity within patient's tolerance, Monitored during session, Repositioned    Home Living                          Prior Function            PT Goals (current goals can now be found in the care plan section) Acute Rehab PT Goals Patient Stated Goal: none stated PT Goal Formulation: With patient Time For Goal Achievement: 08/19/24 Potential to Achieve Goals: Fair Progress towards PT goals: Progressing toward goals    Frequency    Min 2X/week      PT Plan      Co-evaluation              AM-PAC PT 6 Clicks Mobility   Outcome Measure  Help needed turning from your back to your side while in a flat bed without using bedrails?: A Lot Help needed moving from lying on your back to sitting on the side of a flat bed without using  bedrails?: A Lot Help needed moving to and from a bed to a chair (including a wheelchair)?: A Little Help needed standing up from a chair using your arms (e.g., wheelchair or bedside chair)?: A Little Help needed to walk in hospital room?: A Little Help needed climbing 3-5 steps with a railing? : Total 6 Click Score: 14    End of Session Equipment Utilized During Treatment: Other (comment) (helmet) Activity Tolerance: Patient limited by fatigue Patient left: in bed;with call bell/phone within reach;with bed alarm set Nurse Communication: Mobility status PT Visit Diagnosis: Other symptoms and signs involving the nervous system (R29.898);Muscle weakness (generalized) (M62.81)     Time: 8493-8473 PT Time Calculation (min) (ACUTE ONLY): 20 min  Charges:    $Gait Training: 8-22 mins PT General Charges $$ ACUTE PT VISIT: 1 Visit                     Johana RAMAN, PT DPT Acute Rehabilitation Services Secure Chat Preferred  Office [phone]    Pheng Prokop FORBES [place] 08/15/2024, 4:46 PM

## 2024-08-15 NOTE — TOC Progression Note (Addendum)
 Transition of Care [facility]) - Progression Note    Patient Details  Name: Ernest Romero MRN: [id number] Date of Birth: 1966-04-20  Transition of [facility]) CM/SW Contact  Montie LOISE Louder, [place] Phone Number: 08/15/2024, 10:09 AM  Clinical Narrative:     Patient has no bed offers  called APS SW D. Bearett Porcaro @ [phone], left voice message to call back.   Called Peer support Alyse Marsh @ [phone]- unable to leave message voice mailbox is full.  TOC will continue to follow and assist with discharge planning.   Montie Louder, MSW, LCSW Clinical Social Worker    Expected Discharge Plan: Skilled Nursing Facility Barriers to Discharge: Continued Medical Work up               Expected Discharge Plan and Services   Discharge Planning Services: CM Consult                                           Social Drivers of Health (SDOH) Interventions SDOH Screenings   Food Insecurity: Patient Unable To Answer (07/16/2024)  Housing: Unknown (07/16/2024)  Transportation Needs: Patient Unable To Answer (07/16/2024)  Utilities: Patient Unable To Answer (07/16/2024)  Alcohol Screen: Low Risk  (08/30/2022)  Depression (PHQ2-9): Medium Risk (08/30/2022)  Tobacco Use: Medium Risk (08/12/2024)    Readmission Risk Interventions    07/16/2024   12:23 PM  Readmission Risk Prevention Plan  Transportation Screening Complete  Medication Review (RN Care Manager) Complete  PCP or Specialist appointment within 3-5 days of discharge Complete  HRI or Home Care Consult Complete  SW Recovery Care/Counseling Consult Complete  Palliative Care Screening Not Applicable  Skilled Nursing Facility Not Applicable

## 2024-08-16 LAB — VANCOMYCIN, TROUGH: Vancomycin Tr: 23 ug/mL (ref 15–20)

## 2024-08-16 MED ORDER — PANTOPRAZOLE SODIUM 40 MG PO TBEC
40.0000 mg | DELAYED_RELEASE_TABLET | Freq: Every day | ORAL | Status: AC
  Administered 2024-08-16 – 2024-11-08 (×81): 40 mg via ORAL
  Filled 2024-08-16 (×77): qty 1

## 2024-08-16 MED ORDER — ACETAMINOPHEN 325 MG PO TABS
650.0000 mg | ORAL_TABLET | ORAL | Status: AC | PRN
  Administered 2024-08-17 – 2024-10-25 (×35): 650 mg via ORAL
  Filled 2024-08-16 (×34): qty 2

## 2024-08-16 MED ORDER — LORAZEPAM 2 MG/ML IJ SOLN
0.5000 mg | Freq: Once | INTRAMUSCULAR | Status: AC
Start: 2024-08-17 — End: 2024-08-16
  Administered 2024-08-16: 0.5 mg via INTRAVENOUS
  Filled 2024-08-16: qty 1

## 2024-08-16 MED ORDER — ACETAMINOPHEN 650 MG RE SUPP: 650.0000 mg | RECTAL | Status: AC | PRN

## 2024-08-16 MED ORDER — LURASIDONE HCL 40 MG PO TABS
40.0000 mg | ORAL_TABLET | Freq: Every day | ORAL | Status: DC
  Administered 2024-08-16 – 2024-08-20 (×5): 40 mg via ORAL
  Filled 2024-08-16 (×5): qty 1

## 2024-08-16 MED ORDER — DOCUSATE SODIUM 50 MG/5ML PO LIQD
100.0000 mg | Freq: Every day | ORAL | Status: AC
  Administered 2024-08-16 – 2024-11-01 (×43): 100 mg via ORAL
  Filled 2024-08-16 (×69): qty 10

## 2024-08-16 MED ORDER — VANCOMYCIN HCL 750 MG/150ML IV SOLN
750.0000 mg | Freq: Three times a day (TID) | INTRAVENOUS | Status: DC
  Administered 2024-08-16 – 2024-08-18 (×7): 750 mg via INTRAVENOUS
  Filled 2024-08-16 (×9): qty 150

## 2024-08-16 NOTE — Progress Notes (Signed)
 Pharmacy Antibiotic Note  Ernest Romero is a 58 y.o. male admitted on 07/15/2024 with bone flap infection s/p cranioplasty.  Pharmacy has been consulted for vancomycin dosing.  Utilizing traditional trough dosing due to CNS involvement.   Vanc trough 23 (drawn on time) - elevated for goal  Plan: Change vancomycin to 750 mg IV every 8 hours, Goal trough 15-20 mcg/mL.  Height: 5' 10 (177.8 cm) Weight: 65 kg (143 lb 4.8 oz) IBW/kg (Calculated) : 73  Temp (24hrs), Avg:97.9 F (36.6 C), Min:97.5 F (36.4 C), Max:98.2 F (36.8 C)  Recent Labs  Lab 08/10/24 1741 08/13/24 0254 08/13/24 2311 08/15/24 0330 08/16/24 0731  WBC  --  9.1  --  4.9  --   CREATININE 0.97 1.02  --  0.91  --   VANCOTROUGH  --   --  9*  --  23*    Estimated Creatinine Clearance: 79.4 mL/min (by C-G formula based on SCr of 0.91 mg/dL).    No Known Allergies   Thank you for allowing pharmacy to be a part of this patient's care.  Harlene Boga, PharmD, BCPS, BCCCP Clinical Pharmacist Please refer to [facility] for [facility] Pharmacy numbers 08/16/2024 1:12 PM

## 2024-08-16 NOTE — Progress Notes (Signed)
 Lab called RN to let her know about critical vancomycin level which was 23. RN paged Dr. Judeth about critcal lab value. He responded at 08:21 am

## 2024-08-16 NOTE — TOC Progression Note (Signed)
 Transition of Care [facility]) - Progression Note    Patient Details  Name: Ernest Romero MRN: [id number] Date of Birth: 1966/08/16  [facility]) CM/SW Contact  Kriste Broman M, RN Phone Number: 08/16/2024, 4:14 PM  Clinical Narrative:    Patient was evaluated by Mabelene FLY earlier in this hospital stay, but did not meet eligibility for LTAC admission at the time.  It appears he will now need long term IV antibiotics which could qualify him for LTAC.  Notified DJ, admissions liaison with [facility]; he states he will review and follow up with patient on Monday.      Expected Discharge Plan: Skilled Nursing Facility Barriers to Discharge: Continued Medical Work up               Expected Discharge Plan and Services   Discharge Planning Services: CM Consult                                           Social Drivers of Health (SDOH) Interventions SDOH Screenings   Food Insecurity: Patient Unable To Answer (07/16/2024)  Housing: Unknown (07/16/2024)  Transportation Needs: Patient Unable To Answer (07/16/2024)  Utilities: Patient Unable To Answer (07/16/2024)  Alcohol Screen: Low Risk  (08/30/2022)  Depression (PHQ2-9): Medium Risk (08/30/2022)  Tobacco Use: Medium Risk (08/12/2024)    Readmission Risk Interventions    07/16/2024   12:23 PM  Readmission Risk Prevention Plan  Transportation Screening Complete  Medication Review (RN Care Manager) Complete  PCP or Specialist appointment within 3-5 days of discharge Complete  HRI or Home Care Consult Complete  SW Recovery Care/Counseling Consult Complete  Palliative Care Screening Not Applicable  Skilled Nursing Facility Not Applicable   Mliss MICAEL Fass, RN, BSN  Trauma/Neuro ICU Case Manager [phone]

## 2024-08-16 NOTE — Plan of Care (Signed)
  Problem: Education: Goal: Knowledge of General Education information will improve Description: Including pain rating scale, medication(s)/side effects and non-pharmacologic comfort measures Outcome: Progressing   Problem: Health Behavior/Discharge Planning: Goal: Ability to manage health-related needs will improve Outcome: Progressing   Problem: Clinical Measurements: Goal: Ability to maintain clinical measurements within normal limits will improve Outcome: Progressing Goal: Will remain free from infection Outcome: Progressing Goal: Diagnostic test results will improve Outcome: Progressing Goal: Respiratory complications will improve Outcome: Progressing Goal: Cardiovascular complication will be avoided Outcome: Progressing   Problem: Activity: Goal: Risk for activity intolerance will decrease Outcome: Progressing   Problem: Nutrition: Goal: Adequate nutrition will be maintained Outcome: Progressing   Problem: Coping: Goal: Level of anxiety will decrease Outcome: Progressing   Problem: Elimination: Goal: Will not experience complications related to bowel motility Outcome: Progressing Goal: Will not experience complications related to urinary retention Outcome: Progressing   Problem: Pain Managment: Goal: General experience of comfort will improve and/or be controlled Outcome: Progressing   Problem: Safety: Goal: Ability to remain free from injury will improve Outcome: Progressing   Problem: Skin Integrity: Goal: Risk for impaired skin integrity will decrease Outcome: Progressing   Problem: Education: Goal: Knowledge of the prescribed therapeutic regimen will improve Outcome: Progressing   Problem: Clinical Measurements: Goal: Usual level of consciousness will be regained or maintained. Outcome: Progressing Goal: Neurologic status will improve Outcome: Progressing Goal: Ability to maintain intracranial pressure will improve Outcome: Progressing    Problem: Skin Integrity: Goal: Demonstration of wound healing without infection will improve Outcome: Progressing

## 2024-08-16 NOTE — TOC Progression Note (Signed)
 Transition of Care [facility]) - Progression Note    Patient Details  Name: Ernest Romero MRN: [id number] Date of Birth: 1966/06/03  Transition of Care [facility]) CM/SW Contact  Montie LOISE Louder, [place] Phone Number: 08/16/2024, 9:53 AM  Clinical Narrative:     Received call from [facility] APS SW Fernando Daring work # [phone] cell  # [phone]- she is the assigned SW following, SW Nat Chang is assisting [phone].   Montie Louder, MSW, LCSW Clinical Social Worker      Expected Discharge Plan: Skilled Nursing Facility Barriers to Discharge: Continued Medical Work up               Expected Discharge Plan and Services   Discharge Planning Services: CM Consult                                           Social Drivers of Health (SDOH) Interventions SDOH Screenings   Food Insecurity: Patient Unable To Answer (07/16/2024)  Housing: Unknown (07/16/2024)  Transportation Needs: Patient Unable To Answer (07/16/2024)  Utilities: Patient Unable To Answer (07/16/2024)  Alcohol Screen: Low Risk  (08/30/2022)  Depression (PHQ2-9): Medium Risk (08/30/2022)  Tobacco Use: Medium Risk (08/12/2024)    Readmission Risk Interventions    07/16/2024   12:23 PM  Readmission Risk Prevention Plan  Transportation Screening Complete  Medication Review (RN Care Manager) Complete  PCP or Specialist appointment within 3-5 days of discharge Complete  HRI or Home Care Consult Complete  SW Recovery Care/Counseling Consult Complete  Palliative Care Screening Not Applicable  Skilled Nursing Facility Not Applicable

## 2024-08-16 NOTE — Progress Notes (Addendum)
 PROGRESS NOTE   Ernest Romero  FMW:[id number]    DOB: [birth year]    DOA: 07/15/2024  PCP: [name]   I have briefly reviewed patients previous medical records in [facility].   Brief Hospital Course:  PCCM Xfer 22/58. 58 year old male, homeless, medical history significant for alcohol use disorder, bipolar and panic disorder, initially presented to [facility] jail in September 2025 with a large subdural hematoma.  Underwent craniotomy with flap replacement on 10/27.  Since then multiple OR visits including 10/30 for SDH evacuation, follow-up CT head 11/8 showed fluid collection underneath the bone flap, back to OR on 11/10 and the bone flap was removed, cultures were obtained, intraoperative cultures grew MRSA, treating for infected bone flap, ID on board and currently on IV vancomycin.  Referred to PCCM progress note from 11/11 for detail tabulation of hospital events until then.   Assessment & Plan:   Subdural hematoma  S/P initial craniotomy 9/28 (Dr. Loa poor barr)  S/P skull flap left cranioplasty 10/27 (Dr. Rosslyn).  Unfortunately complicated by MRSA infected bone flap which is now s/p removal 11/10. - Post op care per Dr. Rosslyn with NSGY. - Abx per ID, vancomycin IV.  They will make final recommendations following final culture results.  He will need PICC line and prolonged IV antibiotics. - Keppra. - Tylenol , Fioricet, low dose Fentanyl  PRN - Therapies recommend SNF.  ICM on board for SNF placement. Discussed with Ernest Romero, ID regarding need for PICC line and prolonged IV antibiotics.   Acute metabolic encephalopathy Per prior review of Dr. Rosario, [facility] MD note from 11/10, patient unable to make decisions about medical treatment due to impaired capacity, secondary to neurosurgical issues noted above and multifactorial. At baseline, patient reportedly without cognitive impairment, was homeless and independent caring for self. Patient's  support counselor Ernest Romero has been visiting intermittently. [phone]).  Per information available to medical team, he does not have kids, his spouse passed away. Has sister, estranged.  Also per Dr. Donn note from 7/10, given his current deficits in planning/executive function/memory, patient is likely to need guardianship for decisions i.e. placement. Minimized as needed Haldol  dose for agitation on 11/12.  Mental status much improved. GCS fluctuating, 13-14 yesterday, 15 this morning.  Over the last couple days, mostly sleepy in the morning-Will request pharmacy to see if Latuda  can be scheduled in the late evening rather than at bedtime.  Did get a dose of IV Haldol  around midnight last night.  He is also on scheduled clonazepam , gabapentin and Keppra which could be contributing.   Bipolar disorder - Continue Lurasidone , supportive care. - Lithium  was discontinued on 10/27 and has not been on this since then.  Unclear if he was taking this PTA.  Seems to be doing okay without it.  Continue to monitor off of lithium .   History of alcohol, tobacco use disorder - thiamine , folic acid , multivitamin  - Continue PTA Klonopin . Treated for alcohol withdrawal delirium with phenobarbital taper at [facility].  Alcohol withdrawal resolved.   Essential HTN - continue amlodipine , losartan.  Reasonably controlled.  Constipation - last BM 11/5. - Continue Colace scheduled and PRN, Senna scheduled change from 1 to 2 tabs. - Change Miralax from PRN to daily. Appears that he had BM after enema on 11/13.  Nutrition Status: Nutrition Problem: Severe Malnutrition Etiology: acute illness Signs/Symptoms: energy intake < or equal to 50% for > or equal to 5 days, severe muscle depletion, moderate  fat depletion Interventions: Tube feeding Agree with dietitian evaluation and management.  Body mass index is 20.56 kg/m.   DVT prophylaxis: heparin injection 5,000 Units Start: 08/13/24 1400 SCDs Start:  08/12/24 1150 Place TED hose Start: 08/12/24 1150 SCDs Start: 08/01/24 1745 Place and maintain sequential compression device Start: 07/29/24 1819     Code Status: Full Code:  Family Communication: None at bedside. Disposition:  Status is: Inpatient Remains inpatient appropriate because: Recent surgery, ongoing AMS, including requiring Haldol  this morning.     Consultants:   PCCM Neurology Neurosurgery Psychiatry Infectious disease  Procedures:   As noted above  Subjective:  Seen noted to be sleepy this morning but arousable.  Briefly interacts and goes back to sleep.  Denies complaints.  Per nursing, no acute issues.  Objective:   Vitals:   08/16/24 0300 08/16/24 0500 08/16/24 0751 08/16/24 1115  BP: 96/67  100/71 100/78  Pulse: (!) 55  (!) 54   Resp: 16  16 (!) 24  Temp: 98.1 F (36.7 C)  98.2 F (36.8 C) (!) 97.5 F (36.4 C)  TempSrc: Oral  Oral Oral  SpO2: 96%  98% 96%  Weight:  65 kg    Height:        General exam: Young male, moderately built and nourished lying comfortably propped up in bed without distress. Respiratory system: Clear to auscultation. Respiratory effort normal. Cardiovascular system: S1 & S2 heard, RRR. No JVD, murmurs, rubs, gallops or clicks. No pedal edema.  Telemetry personally reviewed at bedside: SR mostly.  Occasional sinus bradycardia in the 50s. Gastrointestinal system: Abdomen is nondistended, soft and nontender. No organomegaly or masses felt. Normal bowel sounds heard. Central nervous system: Mental status as noted above.  Much improved compared to yesterday. Extremities: Not moving limbs currently. Skin: No rashes, lesions or ulcers Psychiatry: Judgement and insight cannot be assessed at this time. Mood & affect: Cannot be assessed at this time.  Scalp: Has sutures from recent craniotomy, intact without acute findings.  Cranial drains were removed yesterday.   Data Reviewed:   I have personally reviewed following labs and  imaging studies   CBC: Recent Labs  Lab 08/13/24 0254 08/15/24 0330  WBC 9.1 4.9  HGB 11.6* 11.0*  HCT 36.3* 32.7*  MCV 92.8 89.6  PLT 316 348    Basic Metabolic Panel: Recent Labs  Lab 08/10/24 1741 08/13/24 0254 08/15/24 0330  NA 138 138 140  K 4.2 4.5 3.6  CL 102 108 109  CO2 25 19* 22  GLUCOSE 110* 82 95  BUN 14 19 14   CREATININE 0.97 1.02 0.91  CALCIUM 9.3 8.9 9.1  PHOS 4.5  --   --     Liver Function Tests: Recent Labs  Lab 08/10/24 1741  ALBUMIN 3.5    CBG: Recent Labs  Lab 08/13/24 0736  GLUCAP 118*    Microbiology Studies:   Recent Results (from the past 240 hours)  Aerobic/Anaerobic Culture w Gram Stain (surgical/deep wound)     Status: None (Preliminary result)   Collection Time: 08/12/24  9:54 AM   Specimen: Wound; Tissue  Result Value Ref Range Status   Specimen Description WOUND  Final   Special Requests LEFT  Final   Gram Stain   Final    RARE WBC PRESENT, PREDOMINANTLY PMN NO ORGANISMS SEEN Performed at [facility], [address], [place], [place] [address]    Culture   Final    ABUNDANT METHICILLIN RESISTANT STAPHYLOCOCCUS AUREUS NO ANAEROBES ISOLATED;  CULTURE IN PROGRESS FOR 5 DAYS    Report Status PENDING  Incomplete   Organism ID, Bacteria METHICILLIN RESISTANT STAPHYLOCOCCUS AUREUS  Final      Susceptibility   Methicillin resistant staphylococcus aureus - MIC*    CIPROFLOXACIN >=8 RESISTANT Resistant     ERYTHROMYCIN >=8 RESISTANT Resistant     GENTAMICIN <=0.5 SENSITIVE Sensitive     OXACILLIN >=4 RESISTANT Resistant     TETRACYCLINE <=1 SENSITIVE Sensitive     VANCOMYCIN 1 SENSITIVE Sensitive     TRIMETH /SULFA  80 RESISTANT Resistant     CLINDAMYCIN  <=0.25 SENSITIVE Sensitive     RIFAMPIN <=0.5 SENSITIVE Sensitive     Inducible Clindamycin  NEGATIVE Sensitive     LINEZOLID 2 SENSITIVE Sensitive     * ABUNDANT METHICILLIN RESISTANT STAPHYLOCOCCUS AUREUS  Aerobic/Anaerobic Culture w Gram Stain (surgical/deep  wound)     Status: None (Preliminary result)   Collection Time: 08/12/24  9:55 AM   Specimen: Wound; Tissue  Result Value Ref Range Status   Specimen Description WOUND  Final   Special Requests LEFT  Final   Gram Stain   Final    NO WBC SEEN NO ORGANISMS SEEN Performed at [facility], [address], [place], [place] [address]    Culture   Final    MODERATE METHICILLIN RESISTANT STAPHYLOCOCCUS AUREUS NO ANAEROBES ISOLATED; CULTURE IN PROGRESS FOR 5 DAYS    Report Status PENDING  Incomplete   Organism ID, Bacteria METHICILLIN RESISTANT STAPHYLOCOCCUS AUREUS  Final      Susceptibility   Methicillin resistant staphylococcus aureus - MIC*    CIPROFLOXACIN >=8 RESISTANT Resistant     ERYTHROMYCIN >=8 RESISTANT Resistant     GENTAMICIN <=0.5 SENSITIVE Sensitive     OXACILLIN >=4 RESISTANT Resistant     TETRACYCLINE <=1 SENSITIVE Sensitive     VANCOMYCIN 1 SENSITIVE Sensitive     TRIMETH /SULFA  80 RESISTANT Resistant     CLINDAMYCIN  <=0.25 SENSITIVE Sensitive     RIFAMPIN <=0.5 SENSITIVE Sensitive     Inducible Clindamycin  NEGATIVE Sensitive     LINEZOLID 2 SENSITIVE Sensitive     * MODERATE METHICILLIN RESISTANT STAPHYLOCOCCUS AUREUS    Radiology Studies:  No results found.   Scheduled Meds:    amLODipine   10 mg Oral Daily   Chlorhexidine Gluconate Cloth  6 each Topical Daily   clonazePAM   1 mg Oral BID   docusate  100 mg Oral Daily   feeding supplement  237 mL Oral BID BM   folic acid   1 mg Oral Daily   gabapentin  200 mg Oral TID   heparin injection (subcutaneous)  5,000 Units Subcutaneous Q8H   levETIRAcetam  500 mg Oral BID   losartan  50 mg Oral Daily   lurasidone   40 mg Oral QHS   multivitamin with minerals  1 tablet Oral Daily   nicotine  14 mg Transdermal Daily   pantoprazole  40 mg Oral QHS   polyethylene glycol  17 g Oral Daily   senna  2 tablet Oral Daily   sodium chloride  flush  10-40 mL Intracatheter Q12H   thiamine  (VITAMIN B1) injection  100 mg  Intravenous Q24H    Continuous Infusions:    vancomycin       LOS: 32 days     Trenda Mar, MD,  FACP, [facility], [facility], [facility]   [facility] Physician Advisor [place]      To contact the attending provider between 7A-7P or the covering provider during after  hours 7P-7A, please log into the web site [email or web address] and access using universal [place] password for that web site. If you do not have the password, please call the hospital operator.  08/16/2024, 1:56 PM

## 2024-08-16 NOTE — Progress Notes (Signed)
 Occupational Therapy Treatment Patient Details Name: Ernest Romero MRN: [id number] DOB: 25-Dec-1965 Today's Date: 08/16/2024   History of present illness Pt is a 58 y.o. male presenting 10/14 from [facility] where he was admitted [facility] in the jail. 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 On CIWA protocol. Transferred to [facility] for further management of craniotomy and flap. 10/27 s/p cranioplasty with bone flap replacement. 10/28 Change in status thought to be related to Klonopin  administration 10/28, repeat CTH shows significant increase in size of mixed attenuation subdural fluid collection underlying the L tempoparietal craniotomy site, now measuring approximately 15 mm in thickness, with associated mild mass effect and slight rightward midline shift. OR on 10/30 for SDH evacuation. [facility] 11/7 with fluid collected under bone flap. 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 PMH alcohol use disorder, psychiatric disorder, HTN   OT comments  Patient in bed, agreeable to OT session.  Pt needing mod assist to transition to EOB, engaged in Adls at EOB to work on sitting and standing balance functionally.  Completing grooming with Ernest assist, LB bathing sitting EOB with mod assist, and UB bathing in standing with Ernest assist.  He requires cueing to correct posterior lean, continues to have decreased awareness of safety and poor body awareness.   Pt unable to state month today.  Will follow acutely, continue to recommend <3hrs/day inpatient setting at dc.       If plan is discharge home, recommend the following:  Supervision due to cognitive status;Direct supervision/assist for medications management;Assistance with cooking/housework;Direct supervision/assist for financial management;Assist for transportation;Assistance with feeding;A little help with walking and/or transfers;A lot of help with  bathing/dressing/bathroom;Help with stairs or ramp for entrance   Equipment Recommendations  Other (comment) (defer)    Recommendations for Other Services      Precautions / Restrictions Precautions Precautions: Fall Recall of Precautions/Restrictions: Impaired Precaution/Restrictions Comments: SBP <160, helmet (pt to wear when OOB per RN) Restrictions Weight Bearing Restrictions Per Provider Order: No       Mobility Bed Mobility Overal bed mobility: Needs Assistance Bed Mobility: Supine to Sit, Sit to Supine, Rolling Rolling: Max assist   Supine to sit: Mod assist, HOB elevated Sit to supine: Mod assist, HOB elevated   General bed mobility comments: assist to roll from L to R, mod assist for trunk support to ascned at EOB.  Returned to supine with guiding support of trunk and LEs    Transfers Overall transfer level: Needs assistance Equipment used: Rolling walker (2 wheels) Transfers: Sit to/from Stand Sit to Stand: Ernest assist           General transfer comment: to power up and steady, cueing for hand placement     Balance Overall balance assessment: Needs assistance Sitting-balance support: Feet supported, Bilateral upper extremity supported Sitting balance-Leahy Scale: Poor Sitting balance - Comments: posterior bias, R lateral lean; cga to mod assist required Postural control: Posterior lean, Right lateral lean Standing balance support: Bilateral upper extremity supported, Reliant on assistive device for balance Standing balance-Leahy Scale: Poor Standing balance comment: reliant on RW support, Ernest assist when standing without UE support during ADLs                           ADL either performed or assessed with clinical judgement   ADL Overall ADL's : Needs assistance/impaired  Grooming: Contact guard assist;Wash/dry face;Sitting Grooming Details (indicate cue type and reason): EOB, posterior lean Upper Body Bathing: Minimal  assistance;Sitting   Lower Body Bathing: Moderate assistance;Sit to/from stand           Toilet Transfer: Minimal assistance;Rolling walker (2 wheels) Toilet Transfer Details (indicate cue type and reason): simulated side stepping towards HOB         Functional mobility during ADLs: Minimal assistance;Moderate assistance;Rolling walker (2 wheels);Cueing for sequencing;Cueing for safety General ADL Comments: pt with posterior lean at EOB, worked on dyanmic sitting balance at EOB with anterior weight shift    Extremity/Trunk Assessment              Occupational Psychologist Communication: Impaired Factors Affecting Communication: Difficulty expressing self   Cognition Arousal: Alert Behavior During Therapy: Flat affect Cognition: Cognition impaired   Orientation impairments: Time Awareness: Online awareness impaired Memory impairment (select all impairments): Short-term memory, Working memory Attention impairment (select first level of impairment): Sustained attention Executive functioning impairment (select all impairments): Organization, Sequencing, Reasoning, Problem solving, Initiation OT - Cognition Comments: pt following simple commands with increaed time, limited by decreased safety and problem solving.  pt unable to state month even after several cues.                 Following commands: Impaired Following commands impaired: Follows one step commands inconsistently, Follows one step commands with increased time      Cueing   Cueing Techniques: Verbal cues  Exercises      Shoulder Instructions       General Comments BP soft, RN notified (100/78 EOB, 89/65 upon return to supine, 92/65 after several minutes supine)    Pertinent Vitals/ Pain       Pain Assessment Pain Assessment: Faces Faces Pain Scale: Hurts a little bit Pain Location: generalized Pain Descriptors / Indicators: Discomfort Pain  Intervention(s): Limited activity within patient's tolerance, Monitored during session, Repositioned  Home Living                                          Prior Functioning/Environment              Frequency  Ernest 2X/week        Progress Toward Goals  OT Goals(current goals can now be found in the care plan section)  Progress towards OT goals: Progressing toward goals  Acute Rehab OT Goals Patient Stated Goal: get better OT Goal Formulation: With patient Time For Goal Achievement: 08/28/24 Potential to Achieve Goals: Fair  Plan      Co-evaluation                 AM-PAC OT 6 Clicks Daily Activity     Outcome Measure   Help from another person eating meals?: A Lot Help from another person taking care of personal grooming?: A Lot Help from another person toileting, which includes using toliet, bedpan, or urinal?: Total Help from another person bathing (including washing, rinsing, drying)?: A Lot Help from another person to put on and taking off regular upper body clothing?: A Lot Help from another person to put on and taking off regular lower body clothing?: A Lot 6 Click Score: 11    End of Session Equipment Utilized During Treatment: Gait belt;Rolling walker (2  wheels)  OT Visit Diagnosis: Other abnormalities of gait and mobility (R26.89);Muscle weakness (generalized) (M62.81);Other symptoms and signs involving the nervous system (R29.898);Cognitive communication deficit (R41.841);Other symptoms and signs involving cognitive function   Activity Tolerance Patient tolerated treatment well   Patient Left in bed;with call bell/phone within reach;with bed alarm set   Nurse Communication Mobility status;Precautions;Other (comment) (BP soft)        Time: 8897-8872 OT Time Calculation (Ernest): 25 Ernest  Charges: OT General Charges $OT Visit: 1 Visit OT Treatments $Self Care/Home Management : 23-37 mins  Etta NOVAK, OT Acute  Rehabilitation Services Office [phone] Secure Chat Preferred    Etta GORMAN Hope 08/16/2024, 1:38 PM

## 2024-08-16 NOTE — Progress Notes (Signed)
 [facility]  Date of Admission:  07/15/2024     Reason for Follow Up: Subdural hematoma (HCC)  Total days of antibiotics 5         ASSESSMENT:   Mr. Granade is a 58 y/o caucasian male initially presenting with subdural hematoma requiring evacuation and s/p cranioplasty on 08/01/24 with subsequent development of fluid collection requiring additional burr hole and now found to have infected left bone flap s/p removal.   Mr. 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  Surgical incision appears to be healing well with no evidence of further infection.  Tolerating vancomycin with no adverse side effects.  Random vancomycin level 23 slightly supratherapeutic with no evidence of toxicity with adjustments per pharmacy.  Continue therapeutic drug monitoring of vancomycin levels and renal function.  Postoperative wound care per neurosurgery.  Anticipate he will need approximately 6 weeks of antimicrobial therapy with disposition to be determined.  Continue contact precautions for MRSA.  Remaining medical and supportive care per internal medicine.  PLAN:  Continue current dose of vancomycin. Therapeutic drug monitoring of renal function and vancomycin levels per protocol with adjustments per pharmacy. Postoperative wound care per neurosurgery. Contact precautions for MRSA. Disposition pending. Remaining medical and supportive care per internal medicine.  Dr. Dea is available over the weekend for ID related questions.   Principal Problem:   Subdural hematoma (HCC) Active Problems:   Bipolar disorder (HCC)   Essential hypertension   Alcohol withdrawal (HCC)   Unable to make decisions about medical treatment due to impaired mental capacity   Protein-calorie malnutrition, severe   Acute metabolic encephalopathy   Infected bone flap    amLODipine   10 mg Oral Daily   Chlorhexidine Gluconate Cloth  6 each Topical Daily   clonazePAM   1 mg Oral BID    docusate  100 mg Oral Daily   feeding supplement  237 mL Oral BID BM   folic acid   1 mg Oral Daily   gabapentin  200 mg Oral TID   heparin injection (subcutaneous)  5,000 Units Subcutaneous Q8H   levETIRAcetam  500 mg Oral BID   losartan  50 mg Oral Daily   lurasidone   40 mg Oral QPC supper   multivitamin with minerals  1 tablet Oral Daily   nicotine  14 mg Transdermal Daily   pantoprazole  40 mg Oral QHS   polyethylene glycol  17 g Oral Daily   senna  2 tablet Oral Daily   sodium chloride  flush  10-40 mL Intracatheter Q12H   thiamine  (VITAMIN B1) injection  100 mg Intravenous Q24H    SUBJECTIVE:  Afebrile overnight with no acute events. Tolerating antibiotics with no adverse side effects.    No Known Allergies   Review of Systems: Review of Systems  Constitutional:  Negative for chills, fever and weight loss.  Respiratory:  Negative for cough, shortness of breath and wheezing.   Cardiovascular:  Negative for chest pain and leg swelling.  Gastrointestinal:  Negative for abdominal pain, constipation, diarrhea, nausea and vomiting.  Skin:  Negative for rash.      OBJECTIVE: Vitals:   08/16/24 0300 08/16/24 0500 08/16/24 0751 08/16/24 1115  BP: 96/67  100/71 100/78  Pulse: (!) 55  (!) 54   Resp: 16  16 (!) 24  Temp: 98.1 F (36.7 C)  98.2 F (36.8 C) (!) 97.5 F (36.4 C)  TempSrc: Oral  Oral Oral  SpO2: 96%  98% 96%  Weight:  65  kg    Height:       Body mass index is 20.56 kg/m.  Physical Exam Constitutional:      General: He is not in acute distress.    Appearance: He is well-developed.  HENT:     Head:     Comments: Surgical incision appears well-approximated with sutures intact and no drainage.  No induration or fluctuation. Cardiovascular:     Rate and Rhythm: Normal rate and regular rhythm.     Heart sounds: Normal heart sounds.  Pulmonary:     Effort: Pulmonary effort is normal.     Breath sounds: Normal breath sounds.  Skin:    General: Skin  is warm and dry.  Neurological:     Mental Status: He is alert and oriented to person, place, and time.     Lab Results Lab Results  Component Value Date   WBC 4.9 08/15/2024   HGB 11.0 (L) 08/15/2024   HCT 32.7 (L) 08/15/2024   MCV 89.6 08/15/2024   PLT 348 08/15/2024    Lab Results  Component Value Date   CREATININE 0.91 08/15/2024   BUN 14 08/15/2024   NA 140 08/15/2024   K 3.6 08/15/2024   CL 109 08/15/2024   CO2 22 08/15/2024    Lab Results  Component Value Date   ALT 77 (H) 08/09/2024   AST 38 08/09/2024   ALKPHOS 75 08/09/2024   BILITOT 0.4 08/09/2024     Microbiology: Recent Results (from the past 240 hours)  Aerobic/Anaerobic Culture w Gram Stain (surgical/deep wound)     Status: None (Preliminary result)   Collection Time: 08/12/24  9:54 AM   Specimen: Wound; Tissue  Result Value Ref Range Status   Specimen Description WOUND  Final   Special Requests LEFT  Final   Gram Stain   Final    RARE WBC PRESENT, PREDOMINANTLY PMN NO ORGANISMS SEEN Performed at [facility], [address], [place], [place] [address]    Culture   Final    ABUNDANT METHICILLIN RESISTANT STAPHYLOCOCCUS AUREUS NO ANAEROBES ISOLATED; CULTURE IN PROGRESS FOR 5 DAYS    Report Status PENDING  Incomplete   Organism ID, Bacteria METHICILLIN RESISTANT STAPHYLOCOCCUS AUREUS  Final      Susceptibility   Methicillin resistant staphylococcus aureus - MIC*    CIPROFLOXACIN >=8 RESISTANT Resistant     ERYTHROMYCIN >=8 RESISTANT Resistant     GENTAMICIN <=0.5 SENSITIVE Sensitive     OXACILLIN >=4 RESISTANT Resistant     TETRACYCLINE <=1 SENSITIVE Sensitive     VANCOMYCIN 1 SENSITIVE Sensitive     TRIMETH /SULFA  80 RESISTANT Resistant     CLINDAMYCIN  <=0.25 SENSITIVE Sensitive     RIFAMPIN <=0.5 SENSITIVE Sensitive     Inducible Clindamycin  NEGATIVE Sensitive     LINEZOLID 2 SENSITIVE Sensitive     * ABUNDANT METHICILLIN RESISTANT STAPHYLOCOCCUS AUREUS  Aerobic/Anaerobic Culture w  Gram Stain (surgical/deep wound)     Status: None (Preliminary result)   Collection Time: 08/12/24  9:55 AM   Specimen: Wound; Tissue  Result Value Ref Range Status   Specimen Description WOUND  Final   Special Requests LEFT  Final   Gram Stain   Final    NO WBC SEEN NO ORGANISMS SEEN Performed at [facility], [address] [place], [place] [address]    Culture   Final    MODERATE METHICILLIN RESISTANT STAPHYLOCOCCUS AUREUS NO ANAEROBES ISOLATED; CULTURE IN PROGRESS FOR 5 DAYS    Report  Status PENDING  Incomplete   Organism ID, Bacteria METHICILLIN RESISTANT STAPHYLOCOCCUS AUREUS  Final      Susceptibility   Methicillin resistant staphylococcus aureus - MIC*    CIPROFLOXACIN >=8 RESISTANT Resistant     ERYTHROMYCIN >=8 RESISTANT Resistant     GENTAMICIN <=0.5 SENSITIVE Sensitive     OXACILLIN >=4 RESISTANT Resistant     TETRACYCLINE <=1 SENSITIVE Sensitive     VANCOMYCIN 1 SENSITIVE Sensitive     TRIMETH /SULFA  80 RESISTANT Resistant     CLINDAMYCIN  <=0.25 SENSITIVE Sensitive     RIFAMPIN <=0.5 SENSITIVE Sensitive     Inducible Clindamycin  NEGATIVE Sensitive     LINEZOLID 2 SENSITIVE Sensitive     * MODERATE METHICILLIN RESISTANT STAPHYLOCOCCUS AUREUS     Greg Donnavin Vandenbrink, NP [facility]  08/16/2024  4:57 PM

## 2024-08-17 ENCOUNTER — Other Ambulatory Visit: Payer: Self-pay

## 2024-08-17 DIAGNOSIS — Z79899 Other long term (current) drug therapy: Secondary | ICD-10-CM

## 2024-08-17 DIAGNOSIS — R400 Somnolence: Secondary | ICD-10-CM

## 2024-08-17 LAB — AEROBIC/ANAEROBIC CULTURE W GRAM STAIN (SURGICAL/DEEP WOUND): Gram Stain: NONE SEEN

## 2024-08-17 NOTE — Progress Notes (Signed)
 At bedside to place PICC line.  Pt A&O but conversation does not track normal conversation and shows poor decision making ability. After multiple attempts, was able to contact [name], friend and peer support specialist for consent for PICC placement.  Consent obtained.  Pt currently repeatedly attempting to get out of bed despite frequent correction.  Will hold on PICC placement tonight.  Miranda RN notified.

## 2024-08-17 NOTE — Plan of Care (Signed)
   Problem: Clinical Measurements: Goal: Ability to maintain clinical measurements within normal limits will improve Outcome: Progressing

## 2024-08-17 NOTE — Progress Notes (Signed)
 PROGRESS NOTE   Ernest Romero  FMW:[id number]    DOB: 1966/08/03    DOA: 07/15/2024  PCP: [name]   I have briefly reviewed patients previous medical records in [facility].   Brief Hospital Course:  PCCM Xfer 35/73. 58 year old male, homeless, medical history significant for alcohol use disorder, bipolar and panic disorder, initially presented to [facility] from jail in September 2025 with a large subdural hematoma.  Underwent craniotomy with flap replacement on 10/27.  Since then multiple OR visits including 10/30 for SDH evacuation, follow-up CT head 11/8 showed fluid collection underneath the bone flap, back to OR on 11/10 and the bone flap was removed, cultures were obtained, intraoperative cultures grew MRSA, treating for infected bone flap, ID on board and currently on IV vancomycin.  Referred to PCCM progress note from 11/11 for detail tabulation of hospital events until then.   Assessment & Plan:   Subdural hematoma  S/P initial craniotomy 9/28 (Dr. Loa poor barr)  S/P skull flap left cranioplasty 10/27 (Dr. Rosslyn).  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 - Post op care per Dr. Rosslyn with NSGY. - ID following, recommend PICC line, will likely need 6 weeks of IV antibiotics and placement for same.  Ordered PICC line. - Keppra. - Therapies recommend SNF.  ICM on board for SNF placement.   Acute metabolic encephalopathy Per prior review of Dr. Rosario, [facility] MD note from 11/10, patient unable to make decisions about medical treatment due to impaired capacity, secondary to neurosurgical issues noted above and multifactorial. At baseline, patient reportedly without cognitive impairment, was homeless and independent caring for self. Patient's support counselor Larnell [name] has been visiting intermittently. [phone]).  Per information available to medical team, he does not have kids, his spouse passed away. Has sister,  estranged.  Also per Dr. Donn note from 7/10, given his current deficits in planning/executive function/memory, patient is likely to need guardianship for decisions i.e. placement. Minimized as needed Haldol  dose for agitation on 11/12.   Mental status waxing and waning.  When seen in the morning for the last several days, mostly sleeping but issues with overnight agitation including last night where in addition to as needed IV Haldol  2 mg, received a dose of IV Ativan  0.5 mg.  Again sleeping around midday today.  Scheduled to give Latuda  at 6 PM rather than at bedtime. He is also on scheduled clonazepam , gabapentin and Keppra which could be contributing.  Will reduce clonazepam  from 1 mg twice daily to 0.75 mg twice daily.   Bipolar disorder - Continue Lurasidone , supportive care. - Lithium  was discontinued on 10/27 and has not been on this since then.  Unclear if he was taking this PTA.  Seems to be doing okay without it.  Continue to monitor off of lithium .   History of alcohol, tobacco use disorder - thiamine , folic acid , multivitamin  - Continue PTA Klonopin , dose reduced on 11/15. Treated for alcohol withdrawal delirium with phenobarbital taper at [facility].  Alcohol withdrawal resolved.   Essential HTN - continue amlodipine , losartan.  Reasonably controlled.  Constipation - last BM 11/5. - Continue Colace scheduled and PRN, Senna scheduled change from 1 to 2 tabs. - Change Miralax from PRN to daily. Appears that he had BM after enema on 11/13.  Nutrition Status: Nutrition Problem: Severe Malnutrition Etiology: acute illness Signs/Symptoms: energy intake < or equal to 50% for > or equal to 5 days, severe muscle depletion, moderate  fat depletion Interventions: Tube feeding Agree with dietitian evaluation and management.  Body mass index is 21.1 kg/m.   DVT prophylaxis: heparin injection 5,000 Units Start: 08/13/24 1400 SCDs Start: 08/12/24 1150 Place TED hose Start: 08/12/24  1150 SCDs Start: 08/01/24 1745 Place and maintain sequential compression device Start: 07/29/24 1819     Code Status: Full Code:  Family Communication: None at bedside. Disposition:  Status is: Inpatient Remains inpatient appropriate because: Recent surgery, ongoing AMS, including requiring Haldol  parenterally intermittently at night     Consultants:   PCCM Neurology Neurosurgery Psychiatry Infectious disease  Procedures:   As noted above  Subjective:  Overnight events noted.  Got as needed IV Haldol  + IV Ativan .  Sedated/somnolent this morning.  Arousable to call/touch, briefly opens eyes and says he is okay.  Objective:   Vitals:   08/16/24 1931 08/16/24 2259 08/17/24 0500 08/17/24 0800  BP: 98/66 111/75  (!) 112/90  Pulse: 60 64    Resp: 19 20  20   Temp: 98.4 F (36.9 C) 97.6 F (36.4 C)  (!) 97.4 F (36.3 C)  TempSrc: Oral Oral    SpO2: 98% 96%  98%  Weight:   66.7 kg   Height:        General exam: Young male, moderately built and nourished sleeping comfortably in bed. Respiratory system: Clear to auscultation.  No increased work of breathing. Cardiovascular system: S1 & S2 heard, RRR. No JVD, murmurs, rubs, gallops or clicks. No pedal edema.  Telemetry personally reviewed: Sinus rhythm. Gastrointestinal system: Abdomen is nondistended, soft and nontender. No organomegaly or masses felt. Normal bowel sounds heard. Central nervous system: Mental status as noted above.  No focal neurological deficits.. Extremities: Moves all extremities symmetrically. Skin: No rashes, lesions or ulcers Psychiatry: Judgement and insight cannot be assessed at this time. Mood & affect: Cannot be assessed at this time.  Scalp: Has sutures from recent craniotomy, intact without acute findings.  Cranial drains were removed.   Data Reviewed:   I have personally reviewed following labs and imaging studies   CBC: Recent Labs  Lab 08/13/24 0254 08/15/24 0330  WBC 9.1 4.9  HGB  11.6* 11.0*  HCT 36.3* 32.7*  MCV 92.8 89.6  PLT 316 348    Basic Metabolic Panel: Recent Labs  Lab 08/10/24 1741 08/13/24 0254 08/15/24 0330  NA 138 138 140  K 4.2 4.5 3.6  CL 102 108 109  CO2 25 19* 22  GLUCOSE 110* 82 95  BUN 14 19 14   CREATININE 0.97 1.02 0.91  CALCIUM 9.3 8.9 9.1  PHOS 4.5  --   --     Liver Function Tests: Recent Labs  Lab 08/10/24 1741  ALBUMIN 3.5    CBG: Recent Labs  Lab 08/13/24 0736  GLUCAP 118*    Microbiology Studies:   Recent Results (from the past 240 hours)  Aerobic/Anaerobic Culture w Gram Stain (surgical/deep wound)     Status: None (Preliminary result)   Collection Time: 08/12/24  9:54 AM   Specimen: Wound; Tissue  Result Value Ref Range Status   Specimen Description WOUND  Final   Special Requests LEFT  Final   Gram Stain   Final    RARE WBC PRESENT, PREDOMINANTLY PMN NO ORGANISMS SEEN Performed at [facility], [address] [place], [place] [address]    Culture   Final    ABUNDANT METHICILLIN RESISTANT STAPHYLOCOCCUS AUREUS NO ANAEROBES ISOLATED; CULTURE IN PROGRESS FOR 5 DAYS    Report  Status PENDING  Incomplete   Organism ID, Bacteria METHICILLIN RESISTANT STAPHYLOCOCCUS AUREUS  Final      Susceptibility   Methicillin resistant staphylococcus aureus - MIC*    CIPROFLOXACIN >=8 RESISTANT Resistant     ERYTHROMYCIN >=8 RESISTANT Resistant     GENTAMICIN <=0.5 SENSITIVE Sensitive     OXACILLIN >=4 RESISTANT Resistant     TETRACYCLINE <=1 SENSITIVE Sensitive     VANCOMYCIN 1 SENSITIVE Sensitive     TRIMETH /SULFA  80 RESISTANT Resistant     CLINDAMYCIN  <=0.25 SENSITIVE Sensitive     RIFAMPIN <=0.5 SENSITIVE Sensitive     Inducible Clindamycin  NEGATIVE Sensitive     LINEZOLID 2 SENSITIVE Sensitive     * ABUNDANT METHICILLIN RESISTANT STAPHYLOCOCCUS AUREUS  Aerobic/Anaerobic Culture w Gram Stain (surgical/deep wound)     Status: None (Preliminary result)   Collection Time: 08/12/24  9:55 AM   Specimen:  Wound; Tissue  Result Value Ref Range Status   Specimen Description WOUND  Final   Special Requests LEFT  Final   Gram Stain   Final    NO WBC SEEN NO ORGANISMS SEEN Performed at [facility], [address] [place], [place] [address]    Culture   Final    MODERATE METHICILLIN RESISTANT STAPHYLOCOCCUS AUREUS NO ANAEROBES ISOLATED; CULTURE IN PROGRESS FOR 5 DAYS    Report Status PENDING  Incomplete   Organism ID, Bacteria METHICILLIN RESISTANT STAPHYLOCOCCUS AUREUS  Final      Susceptibility   Methicillin resistant staphylococcus aureus - MIC*    CIPROFLOXACIN >=8 RESISTANT Resistant     ERYTHROMYCIN >=8 RESISTANT Resistant     GENTAMICIN <=0.5 SENSITIVE Sensitive     OXACILLIN >=4 RESISTANT Resistant     TETRACYCLINE <=1 SENSITIVE Sensitive     VANCOMYCIN 1 SENSITIVE Sensitive     TRIMETH /SULFA  80 RESISTANT Resistant     CLINDAMYCIN  <=0.25 SENSITIVE Sensitive     RIFAMPIN <=0.5 SENSITIVE Sensitive     Inducible Clindamycin  NEGATIVE Sensitive     LINEZOLID 2 SENSITIVE Sensitive     * MODERATE METHICILLIN RESISTANT STAPHYLOCOCCUS AUREUS    Radiology Studies:  No results found.   Scheduled Meds:    amLODipine   10 mg Oral Daily   Chlorhexidine Gluconate Cloth  6 each Topical Daily   clonazePAM   1 mg Oral BID   docusate  100 mg Oral Daily   feeding supplement  237 mL Oral BID BM   folic acid   1 mg Oral Daily   gabapentin  200 mg Oral TID   heparin injection (subcutaneous)  5,000 Units Subcutaneous Q8H   levETIRAcetam  500 mg Oral BID   losartan  50 mg Oral Daily   lurasidone   40 mg Oral QPC supper   multivitamin with minerals  1 tablet Oral Daily   nicotine  14 mg Transdermal Daily   pantoprazole  40 mg Oral QHS   polyethylene glycol  17 g Oral Daily   senna  2 tablet Oral Daily   sodium chloride  flush  10-40 mL Intracatheter Q12H   thiamine  (VITAMIN B1) injection  100 mg Intravenous Q24H    Continuous Infusions:    vancomycin 750 mg (08/17/24 1010)      LOS: 33 days     Trenda Mar, MD,  FACP, [facility], [facility], [facility]   [facility] [place]      To contact the attending provider between 7A-7P or the covering provider during after hours 7P-7A, please log into the web  site [email or web address] and access using universal [place] password for that web site. If you do not have the password, please call the hospital operator.  08/17/2024, 1:49 PM

## 2024-08-17 NOTE — Plan of Care (Signed)
  Problem: Education: Goal: Knowledge of General Education information will improve Description: Including pain rating scale, medication(s)/side effects and non-pharmacologic comfort measures Outcome: Progressing   Problem: Health Behavior/Discharge Planning: Goal: Ability to manage health-related needs will improve Outcome: Progressing   Problem: Clinical Measurements: Goal: Ability to maintain clinical measurements within normal limits will improve Outcome: Progressing Goal: Will remain free from infection Outcome: Progressing Goal: Diagnostic test results will improve Outcome: Progressing Goal: Respiratory complications will improve Outcome: Progressing Goal: Cardiovascular complication will be avoided Outcome: Progressing   Problem: Activity: Goal: Risk for activity intolerance will decrease Outcome: Progressing   Problem: Nutrition: Goal: Adequate nutrition will be maintained Outcome: Progressing   Problem: Coping: Goal: Level of anxiety will decrease Outcome: Progressing   Problem: Elimination: Goal: Will not experience complications related to bowel motility Outcome: Progressing Goal: Will not experience complications related to urinary retention Outcome: Progressing   Problem: Pain Managment: Goal: General experience of comfort will improve and/or be controlled Outcome: Progressing   Problem: Safety: Goal: Ability to remain free from injury will improve Outcome: Progressing   Problem: Skin Integrity: Goal: Risk for impaired skin integrity will decrease Outcome: Progressing   Problem: Education: Goal: Knowledge of the prescribed therapeutic regimen will improve Outcome: Progressing   Problem: Clinical Measurements: Goal: Usual level of consciousness will be regained or maintained. Outcome: Progressing Goal: Neurologic status will improve Outcome: Progressing Goal: Ability to maintain intracranial pressure will improve Outcome: Progressing    Problem: Skin Integrity: Goal: Demonstration of wound healing without infection will improve Outcome: Progressing

## 2024-08-18 LAB — BASIC METABOLIC PANEL WITH GFR
Anion gap: 9 (ref 5–15)
BUN: 20 mg/dL (ref 6–20)
CO2: 25 mmol/L (ref 22–32)
Calcium: 9 mg/dL (ref 8.9–10.3)
Chloride: 105 mmol/L (ref 98–111)
Creatinine, Ser: 0.82 mg/dL (ref 0.61–1.24)
GFR, Estimated: >60 mL/min (ref >60)
Glucose, Bld: 114 mg/dL — ABNORMAL HIGH (ref 70–99)
Potassium: 4 mmol/L (ref 3.5–5.1)
Sodium: 139 mmol/L (ref 135–145)

## 2024-08-18 LAB — CBC
HCT: 34.7 % — ABNORMAL LOW (ref 39.0–52.0)
Hemoglobin: 11.8 g/dL — ABNORMAL LOW (ref 13.0–17.0)
MCH: 30 pg (ref 26.0–34.0)
MCHC: 34 g/dL (ref 30.0–36.0)
MCV: 88.3 fL (ref 80.0–100.0)
Platelets: 351 K/uL (ref 150–400)
RBC: 3.93 MIL/uL — ABNORMAL LOW (ref 4.22–5.81)
RDW: 12.5 % (ref 11.5–15.5)
WBC: 5.2 K/uL (ref 4.0–10.5)
nRBC: 0 % (ref 0.0–0.2)

## 2024-08-18 LAB — VANCOMYCIN, TROUGH: Vancomycin Tr: 11 ug/mL — ABNORMAL LOW (ref 15–20)

## 2024-08-18 MED ORDER — VANCOMYCIN HCL 1250 MG/250ML IV SOLN
1250.0000 mg | Freq: Two times a day (BID) | INTRAVENOUS | Status: DC
  Administered 2024-08-19 – 2024-08-20 (×4): 1250 mg via INTRAVENOUS
  Filled 2024-08-18 (×5): qty 250

## 2024-08-18 MED ORDER — VANCOMYCIN HCL 500 MG/100ML IV SOLN
500.0000 mg | Freq: Once | INTRAVENOUS | Status: AC
  Administered 2024-08-18: 500 mg via INTRAVENOUS
  Filled 2024-08-18: qty 100

## 2024-08-18 NOTE — Plan of Care (Signed)
   Problem: Education: Goal: Knowledge of General Education information will improve Description Including pain rating scale, medication(s)/side effects and non-pharmacologic comfort measures Outcome: Progressing   Problem: Health Behavior/Discharge Planning: Goal: Ability to manage health-related needs will improve Outcome: Progressing

## 2024-08-18 NOTE — Progress Notes (Signed)
 Pharmacy Antibiotic Note  Ernest Romero is a 58 y.o. male admitted on 07/15/2024 with bone flap infection s/p cranioplasty.  Pharmacy has been consulted for vancomycin dosing.  Utilizing traditional trough dosing due to CNS involvement.   Vanc trough 11 is subtherapeutic on 750mg  q8h  Plan: Change vancomycin to 1250mg  IV every 12 hours, Goal trough 15-20 mcg/mL.  Height: 5' 10 (177.8 cm) Weight: 66.8 kg (147 lb 4.3 oz) IBW/kg (Calculated) : 73  Temp (24hrs), Avg:97.8 F (36.6 C), Min:97.5 F (36.4 C), Max:98 F (36.7 C)  Recent Labs  Lab 08/13/24 0254 08/13/24 2311 08/15/24 0330 08/16/24 0731 08/18/24 0610 08/18/24 1619  WBC 9.1  --  4.9  --  5.2  --   CREATININE 1.02  --  0.91  --  0.82  --   VANCOTROUGH  --    < >  --  23*  --  11*   < > = values in this interval not displayed.    Estimated Creatinine Clearance: 90.5 mL/min (by C-G formula based on SCr of 0.82 mg/dL).    No Known Allergies   Thank you for allowing pharmacy to be a part of this patient's care.  Rocky Slade, PharmD, BCPS Clinical Pharmacist Please refer to [facility] for [facility] numbers 08/18/2024 6:08 PM

## 2024-08-18 NOTE — Progress Notes (Addendum)
 PROGRESS NOTE   Ernest Romero  FMW:[id number]    DOB: [birth year]    DOA: 07/15/2024  PCP: [name]   I have briefly reviewed patients previous medical records in [facility].   Brief Hospital Course:  PCCM Xfer 55/42. 58 year old male, homeless, medical history significant for alcohol use disorder, bipolar and panic disorder, initially presented to [facility] from jail in September 2025 with a large subdural hematoma.  Underwent craniotomy with flap replacement on 10/27.  Since then multiple OR visits including 10/30 for SDH evacuation, follow-up CT head 11/8 showed fluid collection underneath the bone flap, back to OR on 11/10 and the bone flap was removed, cultures were obtained, intraoperative cultures grew MRSA, treating for infected bone flap, ID on board and currently on IV vancomycin.  Referred to PCCM progress note from 11/11 for detail tabulation of hospital events until then.   Assessment & Plan:   Subdural hematoma  S/P initial craniotomy 9/28 (Dr. Loa poor barr)  S/P skull flap left cranioplasty 10/27 (Dr. Rosslyn).  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 - Post op care per Dr. Rosslyn with NSGY. - ID following, recommend PICC line, will likely need 6 weeks of IV antibiotics and placement for same.  Ordered PICC line.  PICC team evaluated patient on 11/16.  They are concerned that patient will pull out the PICC line and recommend continued IV antibiotics via the PIV and place PICC line just prior to discharge. - Keppra. - Therapies recommend SNF.  ICM on board for SNF placement.   Acute metabolic encephalopathy Per prior review of Dr. Rosario, [facility] MD note from 11/10, patient unable to make decisions about medical treatment due to impaired capacity, secondary to neurosurgical issues noted above and multifactorial. At baseline, patient reportedly without cognitive impairment, was homeless and independent caring for  self. Patient's support counselor Larnell [name] has been visiting intermittently. [phone]).  Per information available to medical team, he does not have kids, his spouse passed away. Has sister, estranged.  Also per Dr. Donn note from 7/10, given his current deficits in planning/executive function/memory, patient is likely to need guardianship for decisions i.e. placement. Minimized as needed Haldol  dose for agitation on 11/12.   Mental status was waxing and waning.  When seen in the morning for the last several days, mostly sleeping but issues with overnight agitation for which getting as needed IV Haldol  nightly.  Scheduled to give Latuda  at 6 PM rather than at bedtime. He is also on scheduled clonazepam , gabapentin and Keppra which could be contributing.  Reduced clonazepam  from 1 mg twice daily to 0.75 mg twice daily. Much more alert and interactive this morning, the best that he has been in the last several days caring for him.    Bipolar disorder - Continue Lurasidone , supportive care. - Lithium  was discontinued on 10/27 and has not been on this since then.  Unclear if he was taking this PTA.  Seems to be doing okay without it.  Continue to monitor off of lithium .   History of alcohol, tobacco use disorder - thiamine , folic acid , multivitamin  - Continue PTA Klonopin , dose reduced on 11/15. Treated for alcohol withdrawal delirium with phenobarbital taper at [facility].  Alcohol withdrawal resolved.   Essential HTN - continue amlodipine , losartan.  Reasonably controlled.  Constipation - last BM 11/5. - Continue Colace scheduled and PRN, Senna scheduled change from 1 to 2 tabs. - Change Miralax from PRN to daily.  Appears that he had BM after enema on 11/13.  Nutrition Status: Nutrition Problem: Severe Malnutrition Etiology: acute illness Signs/Symptoms: energy intake < or equal to 50% for > or equal to 5 days, severe muscle depletion, moderate fat depletion Interventions: Tube  feeding Agree with dietitian evaluation and management.  Body mass index is 21.13 kg/m.   DVT prophylaxis: heparin injection 5,000 Units Start: 08/13/24 1400 SCDs Start: 08/12/24 1150 Place TED hose Start: 08/12/24 1150 SCDs Start: 08/01/24 1745 Place and maintain sequential compression device Start: 07/29/24 1819     Code Status: Full Code:  Family Communication: None at bedside. Disposition:  Status is: Inpatient Remains inpatient appropriate because: Recent surgery, ongoing AMS, including requiring Haldol  parenterally intermittently at night     Consultants:   PCCM Neurology Neurosurgery Psychiatry Infectious disease  Procedures:   As noted above  Subjective:  Again received as needed IV Haldol  last night.  This morning alert, oriented to person and place.  Denied any specific complaints.  Specifically denied pain.  Did say that he wanted to go home.  Objective:   Vitals:   08/18/24 0720 08/18/24 0800 08/18/24 0900 08/18/24 1233  BP:   116/78 130/65  Pulse:  (!) 56  64  Resp:    (!) 24  Temp:  98 F (36.7 C)  (!) 97.5 F (36.4 C)  TempSrc:  Oral  Axillary  SpO2:  95%  96%  Weight: 66.8 kg     Height:        General exam: Young male, moderately built and nourished sleeping comfortably in bed. Respiratory system: Clear to auscultation.  No increased work of breathing. Cardiovascular system: S1 & S2 heard, RRR. No JVD, murmurs, rubs, gallops or clicks. No pedal edema.  Telemetry personally reviewed: Sinus bradycardia in the 50s. Gastrointestinal system: Abdomen is nondistended, soft and nontender. No organomegaly or masses felt. Normal bowel sounds heard. Central nervous system: Alert and oriented x 2.  No focal neurological deficits.. Extremities: Moves all extremities symmetrically and grade 5 x 5 power. Skin: No rashes, lesions or ulcers Psychiatry: Judgement and insight impaired. Mood & affect: Flat.  Scalp: Has sutures from recent craniotomy, intact  without acute findings.  Cranial drains were removed.   Data Reviewed:   I have personally reviewed following labs and imaging studies   CBC: Recent Labs  Lab 08/13/24 0254 08/15/24 0330 08/18/24 0610  WBC 9.1 4.9 5.2  HGB 11.6* 11.0* 11.8*  HCT 36.3* 32.7* 34.7*  MCV 92.8 89.6 88.3  PLT 316 348 351    Basic Metabolic Panel: Recent Labs  Lab 08/13/24 0254 08/15/24 0330 08/18/24 0610  NA 138 140 139  K 4.5 3.6 4.0  CL 108 109 105  CO2 19* 22 25  GLUCOSE 82 95 114*  BUN 19 14 20   CREATININE 1.02 0.91 0.82  CALCIUM 8.9 9.1 9.0    Liver Function Tests: No results for input(s): AST, ALT, ALKPHOS, BILITOT, PROT, ALBUMIN in the last 168 hours.   CBG: Recent Labs  Lab 08/13/24 0736  GLUCAP 118*    Microbiology Studies:   Recent Results (from the past 240 hours)  Aerobic/Anaerobic Culture w Gram Stain (surgical/deep wound)     Status: None   Collection Time: 08/12/24  9:54 AM   Specimen: Wound; Tissue  Result Value Ref Range Status   Specimen Description WOUND  Final   Special Requests LEFT  Final   Gram Stain   Final    RARE WBC PRESENT, PREDOMINANTLY PMN NO  ORGANISMS SEEN    Culture   Final    ABUNDANT METHICILLIN RESISTANT STAPHYLOCOCCUS AUREUS NO ANAEROBES ISOLATED Performed at [facility], [address], , [place] [address]    Report Status 08/17/2024 FINAL  Final   Organism ID, Bacteria METHICILLIN RESISTANT STAPHYLOCOCCUS AUREUS  Final      Susceptibility   Methicillin resistant staphylococcus aureus - MIC*    CIPROFLOXACIN >=8 RESISTANT Resistant     ERYTHROMYCIN >=8 RESISTANT Resistant     GENTAMICIN <=0.5 SENSITIVE Sensitive     OXACILLIN >=4 RESISTANT Resistant     TETRACYCLINE <=1 SENSITIVE Sensitive     VANCOMYCIN 1 SENSITIVE Sensitive     TRIMETH /SULFA  80 RESISTANT Resistant     CLINDAMYCIN  <=0.25 SENSITIVE Sensitive     RIFAMPIN <=0.5 SENSITIVE Sensitive     Inducible Clindamycin  NEGATIVE Sensitive      LINEZOLID 2 SENSITIVE Sensitive     * ABUNDANT METHICILLIN RESISTANT STAPHYLOCOCCUS AUREUS  Aerobic/Anaerobic Culture w Gram Stain (surgical/deep wound)     Status: None   Collection Time: 08/12/24  9:55 AM   Specimen: Wound; Tissue  Result Value Ref Range Status   Specimen Description WOUND  Final   Special Requests LEFT  Final   Gram Stain NO WBC SEEN NO ORGANISMS SEEN   Final   Culture   Final    MODERATE METHICILLIN RESISTANT STAPHYLOCOCCUS AUREUS NO ANAEROBES ISOLATED Performed at [facility], [address], [place], [place] [address]    Report Status 08/17/2024 FINAL  Final   Organism ID, Bacteria METHICILLIN RESISTANT STAPHYLOCOCCUS AUREUS  Final      Susceptibility   Methicillin resistant staphylococcus aureus - MIC*    CIPROFLOXACIN >=8 RESISTANT Resistant     ERYTHROMYCIN >=8 RESISTANT Resistant     GENTAMICIN <=0.5 SENSITIVE Sensitive     OXACILLIN >=4 RESISTANT Resistant     TETRACYCLINE <=1 SENSITIVE Sensitive     VANCOMYCIN 1 SENSITIVE Sensitive     TRIMETH /SULFA  80 RESISTANT Resistant     CLINDAMYCIN  <=0.25 SENSITIVE Sensitive     RIFAMPIN <=0.5 SENSITIVE Sensitive     Inducible Clindamycin  NEGATIVE Sensitive     LINEZOLID 2 SENSITIVE Sensitive     * MODERATE METHICILLIN RESISTANT STAPHYLOCOCCUS AUREUS    Radiology Studies:  US  EKG SITE RITE Result Date: 08/17/2024 If Site Rite image not attached, placement could not be confirmed due to current cardiac rhythm.    Scheduled Meds:    amLODipine   10 mg Oral Daily   Chlorhexidine Gluconate Cloth  6 each Topical Daily   clonazePAM   1 mg Oral BID   docusate  100 mg Oral Daily   feeding supplement  237 mL Oral BID BM   folic acid   1 mg Oral Daily   gabapentin  200 mg Oral TID   heparin injection (subcutaneous)  5,000 Units Subcutaneous Q8H   levETIRAcetam  500 mg Oral BID   losartan  50 mg Oral Daily   lurasidone   40 mg Oral QPC supper   multivitamin with minerals  1 tablet Oral Daily   nicotine   14 mg Transdermal Daily   pantoprazole  40 mg Oral QHS   polyethylene glycol  17 g Oral Daily   senna  2 tablet Oral Daily   sodium chloride  flush  10-40 mL Intracatheter Q12H   thiamine  (VITAMIN B1) injection  100 mg Intravenous Q24H    Continuous Infusions:    vancomycin 750 mg (08/18/24 0850)     LOS:  34 days     Trenda Mar, MD,  FACP, [facility], [facility], [facility]   [facility] [place]      To contact the attending provider between 7A-7P or the covering provider during after hours 7P-7A, please log into the web site [email or web address] and access using universal [place] password for that web site. If you do not have the password, please call the hospital operator.  08/18/2024, 2:28 PM

## 2024-08-18 NOTE — Progress Notes (Signed)
 Secure chat with Cena RN re PICC placement.  States concern that pt will pull out PICC line due to confusion.  RN to speak with MD when rounds re holding PICC placement until day of discharge as long as PIV is working.

## 2024-08-19 ENCOUNTER — Encounter: Payer: Self-pay | Admitting: Neurological Surgery

## 2024-08-19 MED ORDER — THIAMINE MONONITRATE 100 MG PO TABS
100.0000 mg | ORAL_TABLET | Freq: Every day | ORAL | Status: AC
  Administered 2024-08-19 – 2024-11-08 (×83): 100 mg via ORAL
  Filled 2024-08-19 (×74): qty 1

## 2024-08-19 NOTE — Progress Notes (Signed)
 PROGRESS NOTE   Ernest Romero  FMW:[id number]    DOB: 09-30-1966    DOA: 07/15/2024  PCP: [name]   I have briefly reviewed patients previous medical records in [facility].   Brief Hospital Course:  PCCM Xfer 28/94. 58 year old male, homeless, medical history significant for alcohol use disorder, bipolar and panic disorder, initially presented to [facility] from jail in September 2025 with a large subdural hematoma.  Underwent craniotomy with flap replacement on 10/27.  Since then multiple OR visits including 10/30 for SDH evacuation, follow-up CT head 11/8 showed fluid collection underneath the bone flap, back to OR on 11/10 and the bone flap was removed, cultures were obtained, intraoperative cultures grew MRSA, treating for infected bone flap, ID on board and currently on IV vancomycin.  Refer to PCCM progress note from 11/11 for detail tabulation of hospital events until then.   Assessment & Plan:   Subdural hematoma  S/P initial craniotomy 9/28 (Dr. Loa poor barr)  S/P skull flap left cranioplasty 10/27 (Dr. Rosslyn).  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 - Post op care per Dr. Rosslyn with NSGY. - ID following, recommend PICC line, OPAT orders placed by ID on 11/17, antibiotic end date 09/23/2024 and SNF placement for same.  PICC line was ordered but PICC team evaluated patient on 11/16 and were concerned that patient will pull out the PICC line and recommend continued IV antibiotics via the PIV and place PICC line just prior to discharge. - Keppra. - Therapies recommend SNF.  ICM on board for SNF placement.  As per ICM note from 11/14, pursuing LTAC, discussed with [facility] and they were supposed to review and follow-up with ICM today.   Acute metabolic encephalopathy Per prior review of Dr. Rosario, [facility] MD note from 11/10, patient unable to make decisions about medical treatment due to impaired capacity, secondary to  neurosurgical issues noted above and multifactorial. At baseline, patient reportedly without cognitive impairment, was homeless and independent caring for self. Patient's support counselor Larnell [name] has been visiting intermittently. [phone]).  Per information available to medical team, he does not have kids, his spouse passed away. Has sister, estranged.  Also per Dr. Donn note from 7/10, given his current deficits in planning/executive function/memory, patient is likely to need guardianship for decisions i.e. placement. Minimized as needed Haldol  dose for agitation on 11/12.   Mental status was waxing and waning.  When seen in the morning for the last several days, mostly sleeping but issues with overnight agitation for which getting as needed IV Haldol  nightly.  Scheduled to give Latuda  at 6 PM rather than at bedtime. He is also on scheduled clonazepam , gabapentin and Keppra which could be contributing.  Reduced clonazepam  from 1 mg twice daily to 0.75 mg twice daily. Much more alert and interactive over the last 2 days.  Still getting a single dose of nightly as needed IV Haldol ?  For agitation.  Could consider changing to oral Risperdal as needed.   Bipolar disorder - Continue Lurasidone , supportive care. - Lithium  was discontinued on 10/27 and has not been on this since then.  Unclear if he was taking this PTA.  Seems to be doing okay without it.  Continue to monitor off of lithium .   History of alcohol, tobacco use disorder - thiamine , folic acid , multivitamin  - Continue PTA Klonopin , dose reduced on 11/15. Treated for alcohol withdrawal delirium with phenobarbital taper at [facility].  Alcohol withdrawal  resolved.   Essential HTN - continue amlodipine , losartan.  Reasonably controlled.  Constipation - last BM 11/5. - Continue Colace scheduled and PRN, Senna scheduled change from 1 to 2 tabs. - Change Miralax from PRN to daily. Having BMs.  LBM 11/16.  Nutrition  Status: Nutrition Problem: Severe Malnutrition Etiology: acute illness Signs/Symptoms: energy intake < or equal to 50% for > or equal to 5 days, severe muscle depletion, moderate fat depletion Interventions: Tube feeding Agree with dietitian evaluation and management.  Body mass index is 20.31 kg/m.   DVT prophylaxis: heparin injection 5,000 Units Start: 08/13/24 1400 SCDs Start: 08/12/24 1150 Place TED hose Start: 08/12/24 1150 SCDs Start: 08/01/24 1745 Place and maintain sequential compression device Start: 07/29/24 1819     Code Status: Full Code:  Family Communication: None at bedside. Disposition:  Status is: Inpatient Remains inpatient appropriate because: Recent surgery, ongoing AMS, including requiring Haldol  parenterally intermittently at night.  Awaiting safe disposition.     Consultants:   PCCM Neurology Neurosurgery Psychiatry Infectious disease  Procedures:   As noted above  Subjective:  States that he has a tight belt around his abdomen, referred to Posey belt likely due to his frequent attempts to trying to get out of bed.  No other complaints reported.  Objective:   Vitals:   08/19/24 0100 08/19/24 0415 08/19/24 0500 08/19/24 0749  BP: 122/80 125/88  115/80  Pulse: 64 (!) 56  61  Resp: 15 15  19   Temp: 97.6 F (36.4 C) 97.8 F (36.6 C)  97.7 F (36.5 C)  TempSrc: Oral Oral  Oral  SpO2: 95% 96%  99%  Weight:   64.2 kg   Height:        General exam: Young male, moderately built and nourished sleeping comfortably in bed. Respiratory system: Clear to auscultation.  No increased work of breathing. Cardiovascular system: S1 & S2 heard, RRR. No JVD, murmurs, rubs, gallops or clicks. No pedal edema.  Telemetry personally reviewed: Mostly sinus rhythm with occasional sinus bradycardia in the 50s.  Overall stable. Gastrointestinal system: Abdomen is nondistended, soft and nontender. No organomegaly or masses felt. Normal bowel sounds heard. Central  nervous system: Alert and oriented x 2.  No focal neurological deficits.  Stable.. Extremities: Moves all extremities symmetrically and grade 5 x 5 power. Skin: No rashes, lesions or ulcers Psychiatry: Judgement and insight impaired. Mood & affect: Flat.  Scalp: Has sutures from recent craniotomy, intact without acute findings.  Cranial drains were removed.   Data Reviewed:   I have personally reviewed following labs and imaging studies   CBC: Recent Labs  Lab 08/13/24 0254 08/15/24 0330 08/18/24 0610  WBC 9.1 4.9 5.2  HGB 11.6* 11.0* 11.8*  HCT 36.3* 32.7* 34.7*  MCV 92.8 89.6 88.3  PLT 316 348 351    Basic Metabolic Panel: Recent Labs  Lab 08/13/24 0254 08/15/24 0330 08/18/24 0610  NA 138 140 139  K 4.5 3.6 4.0  CL 108 109 105  CO2 19* 22 25  GLUCOSE 82 95 114*  BUN 19 14 20   CREATININE 1.02 0.91 0.82  CALCIUM 8.9 9.1 9.0    Liver Function Tests: No results for input(s): AST, ALT, ALKPHOS, BILITOT, PROT, ALBUMIN in the last 168 hours.   CBG: Recent Labs  Lab 08/13/24 0736  GLUCAP 118*    Microbiology Studies:   Recent Results (from the past 240 hours)  Aerobic/Anaerobic Culture w Gram Stain (surgical/deep wound)     Status: None  Collection Time: 08/12/24  9:54 AM   Specimen: Wound; Tissue  Result Value Ref Range Status   Specimen Description WOUND  Final   Special Requests LEFT  Final   Gram Stain   Final    RARE WBC PRESENT, PREDOMINANTLY PMN NO ORGANISMS SEEN    Culture   Final    ABUNDANT METHICILLIN RESISTANT STAPHYLOCOCCUS AUREUS NO ANAEROBES ISOLATED Performed at [facility], [address] [place], [place] [address]    Report Status 08/17/2024 FINAL  Final   Organism ID, Bacteria METHICILLIN RESISTANT STAPHYLOCOCCUS AUREUS  Final      Susceptibility   Methicillin resistant staphylococcus aureus - MIC*    CIPROFLOXACIN >=8 RESISTANT Resistant     ERYTHROMYCIN >=8 RESISTANT Resistant     GENTAMICIN <=0.5 SENSITIVE  Sensitive     OXACILLIN >=4 RESISTANT Resistant     TETRACYCLINE <=1 SENSITIVE Sensitive     VANCOMYCIN 1 SENSITIVE Sensitive     TRIMETH /SULFA  80 RESISTANT Resistant     CLINDAMYCIN  <=0.25 SENSITIVE Sensitive     RIFAMPIN <=0.5 SENSITIVE Sensitive     Inducible Clindamycin  NEGATIVE Sensitive     LINEZOLID 2 SENSITIVE Sensitive     * ABUNDANT METHICILLIN RESISTANT STAPHYLOCOCCUS AUREUS  Aerobic/Anaerobic Culture w Gram Stain (surgical/deep wound)     Status: None   Collection Time: 08/12/24  9:55 AM   Specimen: Wound; Tissue  Result Value Ref Range Status   Specimen Description WOUND  Final   Special Requests LEFT  Final   Gram Stain NO WBC SEEN NO ORGANISMS SEEN   Final   Culture   Final    MODERATE METHICILLIN RESISTANT STAPHYLOCOCCUS AUREUS NO ANAEROBES ISOLATED Performed at [facility], [address], [place], [place] [address]    Report Status 08/17/2024 FINAL  Final   Organism ID, Bacteria METHICILLIN RESISTANT STAPHYLOCOCCUS AUREUS  Final      Susceptibility   Methicillin resistant staphylococcus aureus - MIC*    CIPROFLOXACIN >=8 RESISTANT Resistant     ERYTHROMYCIN >=8 RESISTANT Resistant     GENTAMICIN <=0.5 SENSITIVE Sensitive     OXACILLIN >=4 RESISTANT Resistant     TETRACYCLINE <=1 SENSITIVE Sensitive     VANCOMYCIN 1 SENSITIVE Sensitive     TRIMETH /SULFA  80 RESISTANT Resistant     CLINDAMYCIN  <=0.25 SENSITIVE Sensitive     RIFAMPIN <=0.5 SENSITIVE Sensitive     Inducible Clindamycin  NEGATIVE Sensitive     LINEZOLID 2 SENSITIVE Sensitive     * MODERATE METHICILLIN RESISTANT STAPHYLOCOCCUS AUREUS    Radiology Studies:  US  EKG SITE RITE Result Date: 08/17/2024 If Site Rite image not attached, placement could not be confirmed due to current cardiac rhythm.    Scheduled Meds:    amLODipine   10 mg Oral Daily   Chlorhexidine Gluconate Cloth  6 each Topical Daily   clonazePAM   1 mg Oral BID   docusate  100 mg Oral Daily   feeding supplement  237  mL Oral BID BM   folic acid   1 mg Oral Daily   gabapentin  200 mg Oral TID   heparin injection (subcutaneous)  5,000 Units Subcutaneous Q8H   losartan  50 mg Oral Daily   lurasidone   40 mg Oral QPC supper   multivitamin with minerals  1 tablet Oral Daily   nicotine  14 mg Transdermal Daily   pantoprazole  40 mg Oral QHS   polyethylene glycol  17 g Oral Daily   senna  2 tablet Oral Daily  sodium chloride  flush  10-40 mL Intracatheter Q12H   thiamine   100 mg Oral Daily    Continuous Infusions:    vancomycin 1,250 mg (08/19/24 0532)     LOS: 35 days     Trenda Mar, MD,  FACP, [facility], [facility], [facility]   [facility]      To contact the attending provider between 7A-7P or the covering provider during after hours 7P-7A, please log into the web site [email or web address] and access using universal [place] password for that web site. If you do not have the password, please call the hospital operator.  08/19/2024, 1:46 PM

## 2024-08-19 NOTE — Progress Notes (Signed)
 [facility]  Date of Admission:  07/15/2024     Reason for Follow Up: Subdural hematoma (HCC)  Total days of antibiotics 8         ASSESSMENT:  [name] is a 58 y/o caucasian male initially presenting with subdural hematoma requiring evacuation and s/p cranioplasty on 08/01/24 with subsequent development of fluid collection requiring additional burr hole and now found to have infected left bone flap s/p removal with surgical specimens growing MRSA.  [name] surgical site appears to be healing with no significant evidence of infection.  Discussed plan of care for 6 weeks of antibiotics with vancomycin.  Most recent vancomycin trough of 11 and therapeutic with no evidence of nephrotoxicity and safe to continue current dose of vancomycin.  Continue biweekly therapeutic drug monitoring of renal function and vancomycin levels with weekly CBC with differential, basic metabolic profile, and CRP and ESR.  Continue postoperative wound care per neurosurgery.  PICC line prior to discharge.  Will arrange follow-up in ID clinic.  Remaining medical and supportive care per internal medicine.  PLAN:  Continue current dose of vancomycin for 6 weeks from surgery with end date 09/23/2024. Continue therapeutic drug monitoring biweekly vancomycin levels and renal function and at least weekly for CBC w diff, basic metabolic profile, and CRP and ESR. Postoperative wound care per neurosurgery. Continue contact precautions for MRSA.  Will arrange follow up in ID clinic. Home Health/OPAT placed if needed.  Remaining medical and supportive care per Internal Medicine.   Diagnosis:  MRSA skull flap infection   No Known Allergies  OPAT Orders Discharge antibiotics to be given via PICC line Discharge antibiotics: Vancomycin 1250 mg IV every 12 hours   Aim for Vancomycin trough 15-20 or AUC 400-550 (unless otherwise indicated)  Duration: 6 weeks  End  Date:  09/23/24  [facility] Care Per Protocol:  Home health RN for IV administration and teaching; PICC line care and labs.    Labs weekly while on IV antibiotics: _X_ CBC with differential _X_ BMP (Biweekly) __ CMP _X_ CRP _X_ ESR _X_ Vancomycin trough (Biweekly) __ CK  __ Please pull PIC at completion of IV antibiotics __ Please leave PIC in place until doctor has seen patient or been notified  Fax weekly labs to [phone]  Clinic Follow Up Appt:  12/18 at 10 am with Ernest Romero      Principal Problem:   Subdural hematoma (HCC) Active Problems:   Bipolar disorder (HCC)   Essential hypertension   Alcohol withdrawal (HCC)   Unable to make decisions about medical treatment due to impaired mental capacity   Protein-calorie malnutrition, severe   Acute metabolic encephalopathy   Infected bone flap    amLODipine   10 mg Oral Daily   Chlorhexidine Gluconate Cloth  6 each Topical Daily   clonazePAM   1 mg Oral BID   docusate  100 mg Oral Daily   feeding supplement  237 mL Oral BID BM   folic acid   1 mg Oral Daily   gabapentin  200 mg Oral TID   heparin injection (subcutaneous)  5,000 Units Subcutaneous Q8H   losartan  50 mg Oral Daily   lurasidone   40 mg Oral QPC supper   multivitamin with minerals  1 tablet Oral Daily   nicotine  14 mg Transdermal Daily   pantoprazole  40 mg Oral QHS   polyethylene glycol  17 g Oral Daily   senna  2 tablet Oral Daily  sodium chloride  flush  10-40 mL Intracatheter Q12H   thiamine   100 mg Oral Daily    SUBJECTIVE:  Afebrile overnight with no acute events.  Tolerating antibiotics with no adverse side effects.  Sleeping but arousable.  No Known Allergies   Review of Systems: Review of Systems  Constitutional:  Negative for chills, fever and weight loss.  Respiratory:  Negative for cough, shortness of breath and wheezing.   Cardiovascular:  Negative for chest pain and leg swelling.  Gastrointestinal:  Negative for abdominal pain,  constipation, diarrhea, nausea and vomiting.  Skin:  Negative for rash.      OBJECTIVE: Vitals:   08/19/24 0415 08/19/24 0500 08/19/24 0749 08/19/24 1210  BP: 125/88  115/80 96/66  Pulse: (!) 56  61 69  Resp: 15  19 20   Temp: 97.8 F (36.6 C)  97.7 F (36.5 C) 97.7 F (36.5 C)  TempSrc: Oral  Oral Oral  SpO2: 96%  99% 96%  Weight:  64.2 kg    Height:       Body mass index is 20.31 kg/m.  Physical Exam Constitutional:      General: He is not in acute distress.    Appearance: He is well-developed.  HENT:     Head:     Comments: Surgical site appears well approximated with sutures intact. No induration or fluctuance.  Cardiovascular:     Rate and Rhythm: Normal rate and regular rhythm.     Heart sounds: Normal heart sounds.  Pulmonary:     Effort: Pulmonary effort is normal.     Breath sounds: Normal breath sounds.  Skin:    General: Skin is warm and dry.  Neurological:     Mental Status: He is alert.     Lab Results Lab Results  Component Value Date   WBC 5.2 08/18/2024   HGB 11.8 (L) 08/18/2024   HCT 34.7 (L) 08/18/2024   MCV 88.3 08/18/2024   PLT 351 08/18/2024    Lab Results  Component Value Date   CREATININE 0.82 08/18/2024   BUN 20 08/18/2024   NA 139 08/18/2024   K 4.0 08/18/2024   CL 105 08/18/2024   CO2 25 08/18/2024    Lab Results  Component Value Date   ALT 77 (H) 08/09/2024   AST 38 08/09/2024   ALKPHOS 75 08/09/2024   BILITOT 0.4 08/09/2024     Microbiology: Recent Results (from the past 240 hours)  Aerobic/Anaerobic Culture w Gram Stain (surgical/deep wound)     Status: None   Collection Time: 08/12/24  9:54 AM   Specimen: Wound; Tissue  Result Value Ref Range Status   Specimen Description WOUND  Final   Special Requests LEFT  Final   Gram Stain   Final    RARE WBC PRESENT, PREDOMINANTLY PMN NO ORGANISMS SEEN    Culture   Final    ABUNDANT METHICILLIN RESISTANT STAPHYLOCOCCUS AUREUS NO ANAEROBES ISOLATED Performed at  [facility], [address] [place], [place] [address]    Report Status 08/17/2024 FINAL  Final   Organism ID, Bacteria METHICILLIN RESISTANT STAPHYLOCOCCUS AUREUS  Final      Susceptibility   Methicillin resistant staphylococcus aureus - MIC*    CIPROFLOXACIN >=8 RESISTANT Resistant     ERYTHROMYCIN >=8 RESISTANT Resistant     GENTAMICIN <=0.5 SENSITIVE Sensitive     OXACILLIN >=4 RESISTANT Resistant     TETRACYCLINE <=1 SENSITIVE Sensitive     VANCOMYCIN 1 SENSITIVE Sensitive  TRIMETH /SULFA  80 RESISTANT Resistant     CLINDAMYCIN  <=0.25 SENSITIVE Sensitive     RIFAMPIN <=0.5 SENSITIVE Sensitive     Inducible Clindamycin  NEGATIVE Sensitive     LINEZOLID 2 SENSITIVE Sensitive     * ABUNDANT METHICILLIN RESISTANT STAPHYLOCOCCUS AUREUS  Aerobic/Anaerobic Culture w Gram Stain (surgical/deep wound)     Status: None   Collection Time: 08/12/24  9:55 AM   Specimen: Wound; Tissue  Result Value Ref Range Status   Specimen Description WOUND  Final   Special Requests LEFT  Final   Gram Stain NO WBC SEEN NO ORGANISMS SEEN   Final   Culture   Final    MODERATE METHICILLIN RESISTANT STAPHYLOCOCCUS AUREUS NO ANAEROBES ISOLATED Performed at [facility], [address] [place], [place] [address]    Report Status 08/17/2024 FINAL  Final   Organism ID, Bacteria METHICILLIN RESISTANT STAPHYLOCOCCUS AUREUS  Final      Susceptibility   Methicillin resistant staphylococcus aureus - MIC*    CIPROFLOXACIN >=8 RESISTANT Resistant     ERYTHROMYCIN >=8 RESISTANT Resistant     GENTAMICIN <=0.5 SENSITIVE Sensitive     OXACILLIN >=4 RESISTANT Resistant     TETRACYCLINE <=1 SENSITIVE Sensitive     VANCOMYCIN 1 SENSITIVE Sensitive     TRIMETH /SULFA  80 RESISTANT Resistant     CLINDAMYCIN  <=0.25 SENSITIVE Sensitive     RIFAMPIN <=0.5 SENSITIVE Sensitive     Inducible Clindamycin  NEGATIVE Sensitive     LINEZOLID 2 SENSITIVE Sensitive     * MODERATE METHICILLIN RESISTANT STAPHYLOCOCCUS  AUREUS     Greg Jameria Bradway, NP [facility]  08/19/2024  3:20 PM

## 2024-08-19 NOTE — Progress Notes (Signed)
 Secure chat with Lauree Gull, RN bedside nurse regarding discharge plan for this patient.  States that there is no plan to dc today.  Ayana aware and agreeable with the previously decided plan to hold off on PICC insertion until day of discharge.  Request for update if discharge is planned for today.

## 2024-08-19 NOTE — Progress Notes (Signed)
 Physical Therapy Treatment Patient Details Name: Ernest Romero MRN: [id number] DOB: [birth year] Today's Date: 08/19/2024   History of Present Illness Pt is a 58 y.o. male presenting 10/14 from [facility] where he was admitted 9/28 for unresponsive episode in the jail. 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 On CIWA protocol. Transferred to [facility] for further management of craniotomy and flap. 10/27 s/p cranioplasty with bone flap replacement. 10/28 Change in status thought to be related to Klonopin  administration 10/28, repeat CTH shows significant increase in size of mixed attenuation subdural fluid collection underlying the L tempoparietal craniotomy site, now measuring approximately 15 mm in thickness, with associated mild mass effect and slight rightward midline shift. OR on 10/30 for SDH evacuation. [facility] 11/7 with fluid collected under bone flap. 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 PMH alcohol use disorder, psychiatric disorder, HTN    PT Comments  Pt ambulatory in hallway with HHA and steadying assist, pt with progressive LE fatigue with mobility but performed well. Pt endorsing fatigue. Left up in chair.     If plan is discharge home, recommend the following: A lot of help with bathing/dressing/bathroom;Assistance with cooking/housework;Direct supervision/assist for medications management;Direct supervision/assist for financial management;Assist for transportation;Help with stairs or ramp for entrance;Supervision due to cognitive status;A little help with walking and/or transfers   Can travel by private vehicle     Yes  Equipment Recommendations  Wheelchair (measurements PT);Wheelchair cushion (measurements PT);Rolling walker (2 wheels)    Recommendations for Other Services       Precautions / Restrictions Precautions Precautions: Fall Recall of Precautions/Restrictions:  Impaired Precaution/Restrictions Comments: SBP <160, helmet (pt to wear when OOB per RN) Restrictions Weight Bearing Restrictions Per Provider Order: No     Mobility  Bed Mobility Overal bed mobility: Needs Assistance Bed Mobility: Supine to Sit     Supine to sit: Min assist     General bed mobility comments: trunk and LE assist, pt asking for truncal support.    Transfers Overall transfer level: Needs assistance Equipment used: 1 person hand held assist Transfers: Sit to/from Stand Sit to Stand: Min assist           General transfer comment: assist for rise and steady    Ambulation/Gait Ambulation/Gait assistance: Min assist Gait Distance (Feet): 200 Feet Assistive device: 1 person hand held assist Gait Pattern/deviations: Step-through pattern, Decreased stride length, Narrow base of support, Trunk flexed Gait velocity: decr     General Gait Details: assist for steadying, cues for wider BOS, upright posture   Stairs             Wheelchair Mobility     Tilt Bed    Modified Rankin (Stroke Patients Only)       Balance Overall balance assessment: Needs assistance Sitting-balance support: Feet supported, Bilateral upper extremity supported Sitting balance-Leahy Scale: Poor Sitting balance - Comments: posterior bias, R lateral lean; cga to mod assist required Postural control: Posterior lean, Right lateral lean Standing balance support: Bilateral upper extremity supported, Reliant on assistive device for balance Standing balance-Leahy Scale: Poor Standing balance comment: posterior bias and min LE bucklign (pt-corrected) with fatigue                            Communication Communication Communication: Impaired Factors Affecting Communication: Difficulty expressing self  Cognition Arousal: Alert Behavior During Therapy: Flat affect  PT - Cognitive impairments: Awareness, Memory, Attention, Problem solving, Safety/Judgement,  Initiation                   Rancho Levels of Cognitive Functioning Rancho Los Amigos Scales of Cognitive Functioning: Confused, Appropriate: Moderate Assistance [email or web address] Scales of Cognitive Functioning: Confused, Appropriate: Moderate Assistance [VI] PT - Cognition Comments: sleeping upon arrival to room, when wakes pt awake but very flat affect. Following commands: Impaired Following commands impaired: Follows one step commands inconsistently, Follows one step commands with increased time    Cueing Cueing Techniques: Verbal cues  Exercises      General Comments        Pertinent Vitals/Pain Pain Assessment Pain Assessment: Faces Faces Pain Scale: Hurts a little bit Pain Location: generalized Pain Descriptors / Indicators: Discomfort Pain Intervention(s): Limited activity within patient's tolerance, Monitored during session, Repositioned    Home Living                          Prior Function            PT Goals (current goals can now be found in the care plan section) Acute Rehab PT Goals Patient Stated Goal: none stated PT Goal Formulation: With patient Time For Goal Achievement: 08/19/24 Potential to Achieve Goals: Fair Progress towards PT goals: Progressing toward goals    Frequency    Min 2X/week      PT Plan      Co-evaluation              AM-PAC PT 6 Clicks Mobility   Outcome Measure  Help needed turning from your back to your side while in a flat bed without using bedrails?: A Little Help needed moving from lying on your back to sitting on the side of a flat bed without using bedrails?: A Little Help needed moving to and from a bed to a chair (including a wheelchair)?: A Little Help needed standing up from a chair using your arms (e.g., wheelchair or bedside chair)?: A Little Help needed to walk in hospital room?: A Little Help needed climbing 3-5 steps with a railing? : A Lot 6 Click Score: 17    End of Session  Equipment Utilized During Treatment: Gait belt;Other (comment) (helmet) Activity Tolerance: Patient limited by fatigue Patient left: with call bell/phone within reach;in chair;with chair alarm set (posey waist alarm belt) Nurse Communication: Mobility status PT Visit Diagnosis: Other symptoms and signs involving the nervous system (R29.898);Muscle weakness (generalized) (M62.81)     Time: 8399-8384 PT Time Calculation (min) (ACUTE ONLY): 15 min  Charges:    $Gait Training: 8-22 mins PT General Charges $$ ACUTE PT VISIT: 1 Visit                     Johana RAMAN, PT DPT Acute Rehabilitation Services Secure Chat Preferred  Office [phone]    Giulian Goldring E Johna 08/19/2024, 4:55 PM

## 2024-08-19 NOTE — Plan of Care (Signed)
  Problem: Activity: Goal: Risk for activity intolerance will decrease Outcome: Progressing   Problem: Nutrition: Goal: Adequate nutrition will be maintained Outcome: Progressing   Problem: Pain Managment: Goal: General experience of comfort will improve and/or be controlled Outcome: Progressing   Problem: Skin Integrity: Goal: Risk for impaired skin integrity will decrease Outcome: Progressing   Problem: Education: Goal: Knowledge of the prescribed therapeutic regimen will improve Outcome: Progressing   Problem: Clinical Measurements: Goal: Usual level of consciousness will be regained or maintained. Outcome: Progressing Goal: Neurologic status will improve Outcome: Progressing Goal: Ability to maintain intracranial pressure will improve Outcome: Progressing   Problem: Skin Integrity: Goal: Demonstration of wound healing without infection will improve Outcome: Progressing

## 2024-08-19 NOTE — Progress Notes (Signed)
 PHARMACY CONSULT NOTE FOR:  OUTPATIENT  PARENTERAL ANTIBIOTIC THERAPY (OPAT)  Indication: Skull bone flap infection Regimen: Vancomycin 1250 mg IV q 12 hours  End date: 09/23/24  IV antibiotic discharge orders are pended. To discharging provider:  please sign these orders via discharge navigator,  Select New Orders & click on the button choice - Manage This Unsigned Work.     Thank you for allowing pharmacy to be a part of this patient's care.  Damien Quiet, PharmD, BCPS, BCIDP Infectious Diseases Clinical Pharmacist Phone: [phone] 08/19/2024, 12:39 PM

## 2024-08-19 NOTE — Plan of Care (Signed)
   Problem: Education: Goal: Knowledge of General Education information will improve Description Including pain rating scale, medication(s)/side effects and non-pharmacologic comfort measures Outcome: Progressing   Problem: Health Behavior/Discharge Planning: Goal: Ability to manage health-related needs will improve Outcome: Progressing

## 2024-08-20 NOTE — Plan of Care (Signed)

## 2024-08-20 NOTE — Progress Notes (Addendum)
 Speech Language Pathology Treatment: Dysphagia;Cognitive-Linguistic  Patient Details Name: Ernest Romero MRN: [id number] DOB: [birth year] Today's Date: 08/20/2024 Time: 8468-8442 SLP Time Calculation (min) (ACUTE ONLY): 26 min  Assessment / Plan / Recommendation Clinical Impression  Swallowing: RN asked SLP to reassess swallowing after noticing coughing while drinking thin liquids. Unable to replicate this coughing when challenging him with large, consecutive sips of soda. Since respiratory status has remained stable (most recent CXR 10/30  without acute cardiopulmonary abnormality) throughout this 35 day admission on a Dys 3 diet with thin liquids, would recommend he continue current diet with close supervision. Will continue to monitor for signs of aspiration as he is currently at increased risk secondary to cognitive impairment, reduced mobility, and inadequate oral hygiene.   Cognitive-Linguistic: Pt presents with behaviors consistent with Rancho Level VI (confused, appropriate). When provided a calendar, he can reorient himself to specific aspects of time and anticipate future events. Posted it within his visual field to aid in orientation day to day. Deficits related to awareness and memory persist and although responses are incorrect, they are appropriate. Carryover of situation and safety insight is limited, though given Min prompting carryover within the session is functional. He may continue to benefit from SLP f/u given PLOF.    HPI HPI: 58 yo male with history of alcohol use disorder, psychiatric disorder, and HTN, who presents 10/14 from [facility], where he was admitted 9/28 after an unresponsive episode in jail. Found to have large SDH s/p L frontoparietal craniotomy with evacuation of SDH and drain placement 9/28 (removed 9/29). Followed by SLP for cognitive-linguistic goals and on a regular diet prior to transfer. Transferred to [facility] for further management of craniotomy and flab. S/p  cranioplasty with bone flap replacement 10/27. Change in status thought to be related to Klonopin  administration 10/28, repeat CTH shows significant increase in size of mixed attenuation subdural fluid collection underlying the L tempoparietal craniotomy site, now measuring approximately 15 mm in thickness, with associated mild mass effect and slight rightward midline shift s/p L frontal burr hole for evacuation 10/30. Underwent L bone flap removal 11/10 after developing SDH s/p burr hole evacuation followed by swelling and suspected infection.      SLP Plan  Continue with current plan of care          Recommendations  Diet recommendations: Dysphagia 3 (mechanical soft);Thin liquid Liquids provided via: Cup;Straw Medication Administration: Whole meds with liquid Supervision: Staff to assist with self feeding;Full supervision/cueing for compensatory strategies Compensations: Minimize environmental distractions;Slow rate;Small sips/bites Postural Changes and/or Swallow Maneuvers: Seated upright 90 degrees                  Oral care BID   Frequent or constant Supervision/Assistance Cognitive communication deficit (M58.158)     Continue with current plan of care     Damien Blumenthal, M.A., CCC-SLP Speech Language Pathology, Acute Rehabilitation Services  Secure Chat preferred [phone]   08/20/2024, 4:44 PM

## 2024-08-20 NOTE — Progress Notes (Addendum)
 PROGRESS NOTE   Ernest Romero  FMW:[id number]    DOB: 1966-05-04    DOA: 07/15/2024  PCP: [name]   I have briefly reviewed patients previous medical records in [facility].   Brief Hospital Course:  PCCM Xfer 80/27. 58 year old male, homeless, medical history significant for alcohol use disorder, bipolar and panic disorder, initially presented to [facility] from jail in September 2025 with a large subdural hematoma.  Underwent craniotomy with flap replacement on 10/27.  Since then multiple OR visits including 10/30 for SDH evacuation, follow-up CT head 11/8 showed fluid collection underneath the bone flap, back to OR on 11/10 and the bone flap was removed, cultures were obtained, intraoperative cultures grew MRSA, treating for infected bone flap, ID on board and currently on IV vancomycin.  Refer to PCCM progress note from 11/11 for detailed tabulation of hospital events until then.   Assessment & Plan:   Subdural hematoma  S/P initial craniotomy 9/28 (Dr. Loa poor barr)  S/P skull flap left cranioplasty 10/27 (Dr. Rosslyn).  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 - Post op care per Dr. Rosslyn with [facility]. Communicated with Dr.Janjua 11/18: Does not plan to put a bone flap back on him.  He plans to DC sutures next week.  Keppra to stay off. - ID following, recommend PICC line (to be placed closer to time of discharge due to risk of patient pulling it out), OPAT orders placed by ID on 11/17, IV antibiotic end date 09/23/2024 (after IV antibiotics, ID recommends continuing treatment with doxycycline  to finish 8 weeks course), ID follow-up with Dr.Vu in clinic on 12/18 at 10 AM and SNF placement for same.   -As per review with pharmacy, completed 1 week Keppra postop. - Therapies recommend SNF.  ICM on board for SNF placement.  As per ICM note from 11/14, pursuing LTAC, the discussed with [facility] and they were supposed to review and  follow-up.   Acute metabolic encephalopathy Per prior review of Dr. Rosario, [facility] MD note from 11/10, patient unable to make decisions about medical treatment due to impaired capacity, secondary to neurosurgical issues noted above and multifactorial. At baseline, patient reportedly without cognitive impairment, was homeless and independent caring for self. Patient's support counselor Larnell [name] has been visiting intermittently. [phone]).  Per information available to medical team, he does not have kids, his spouse passed away. Has sister, estranged.  Also per Dr. Donn note from 7/10, given his current deficits in planning/executive function/memory, patient is likely to need guardianship for decisions i.e. placement. Last week had issues with ongoing daytime sleepiness and intermittent agitation at home.  Reduced dose of as needed IV Haldol .  Adjusted Latuda  so it is given at 6 PM rather than at bedtime.  Considered reducing clonazepam  but have not made changes.  Remains on gabapentin and Keppra.   With above changes, mental status is better, more alert in the morning.  Was getting almost once nightly dose of Haldol  for agitation, did not get a dose last night.  If continues to need IV Haldol  at night, could consider changing to as needed p.o. Risperdal.  Bipolar disorder - Continue Lurasidone , supportive care. - Lithium  was discontinued on 10/27 and has not been on this since then.  Unclear if he was taking this PTA.  Seems to be doing okay without it.  Continue to monitor off of lithium .   History of alcohol, tobacco use disorder - thiamine , folic acid , multivitamin  -  Continue PTA Klonopin , dose reduced on 11/15. Treated for alcohol withdrawal delirium with phenobarbital taper at [facility].  Alcohol withdrawal resolved.   Essential HTN - continue amlodipine , losartan.  Soft blood pressures earlier today, asymptomatic, antihypertensives were held with improvement.  Constipation - last BM  11/5. - Continue Colace scheduled and PRN, Senna scheduled change from 1 to 2 tabs. - Change Miralax from PRN to daily. Having BMs.  LBM 11/16.  Nutrition Status: Nutrition Problem: Severe Malnutrition Etiology: acute illness Signs/Symptoms: energy intake < or equal to 50% for > or equal to 5 days, severe muscle depletion, moderate fat depletion Interventions: Tube feeding Agree with dietitian evaluation and management.  Body mass index is 20.24 kg/m.   DVT prophylaxis: heparin injection 5,000 Units Start: 08/13/24 1400 SCDs Start: 08/12/24 1150 Place TED hose Start: 08/12/24 1150 SCDs Start: 08/01/24 1745 Place and maintain sequential compression device Start: 07/29/24 1819     Code Status: Full Code:  Family Communication: None at bedside. Disposition:  Status is: Inpatient Remains inpatient appropriate because: Recent surgery, ongoing AMS, including requiring Haldol  parenterally intermittently at night.  Awaiting safe disposition.     Consultants:   PCCM Neurology Neurosurgery Psychiatry Infectious disease  Procedures:   As noted above  Subjective:  Seen along with RN at bedside giving his a.m. meds.  Asking when he is being discharged.  Advised him that we are currently looking at facilities who can accept him for rehab and IV antibiotics.  Objective:   Vitals:   08/19/24 2315 08/20/24 0500 08/20/24 1108 08/20/24 1155  BP: (!) 90/59  (!) 89/57 110/80  Pulse: 64  64   Resp: 18  16   Temp: 97.8 F (36.6 C)  97.8 F (36.6 C)   TempSrc: Axillary  Oral   SpO2: 95%  96%   Weight:  64 kg    Height:        General exam: Young male, moderately built and nourished sleeping comfortably in bed. Respiratory system: Clear to auscultation.  No increased work of breathing. Cardiovascular system: S1 & S2 heard, RRR. No JVD, murmurs, rubs, gallops or clicks. No pedal edema.  Telemetry personally reviewed: SB in the 50s-SR. Gastrointestinal system: Abdomen is  nondistended, soft and nontender. No organomegaly or masses felt. Normal bowel sounds heard. Central nervous system: Alert and oriented x 2.  No focal neurological deficits.  Stable.. Extremities: Moves all extremities symmetrically and grade 5 x 5 power. Skin: No rashes, lesions or ulcers Psychiatry: Judgement and insight impaired. Mood & affect: Flat.  Scalp: Has sutures from recent craniotomy, intact without acute findings.  Cranial drains were removed.   Data Reviewed:   I have personally reviewed following labs and imaging studies   CBC: Recent Labs  Lab 08/15/24 0330 08/18/24 0610  WBC 4.9 5.2  HGB 11.0* 11.8*  HCT 32.7* 34.7*  MCV 89.6 88.3  PLT 348 351    Basic Metabolic Panel: Recent Labs  Lab 08/15/24 0330 08/18/24 0610  NA 140 139  K 3.6 4.0  CL 109 105  CO2 22 25  GLUCOSE 95 114*  BUN 14 20  CREATININE 0.91 0.82  CALCIUM 9.1 9.0    Liver Function Tests: No results for input(s): AST, ALT, ALKPHOS, BILITOT, PROT, ALBUMIN in the last 168 hours.   CBG: No results for input(s): GLUCAP in the last 168 hours.   Microbiology Studies:   Recent Results (from the past 240 hours)  Aerobic/Anaerobic Culture w Gram Stain (surgical/deep wound)  Status: None   Collection Time: 08/12/24  9:54 AM   Specimen: Wound; Tissue  Result Value Ref Range Status   Specimen Description WOUND  Final   Special Requests LEFT  Final   Gram Stain   Final    RARE WBC PRESENT, PREDOMINANTLY PMN NO ORGANISMS SEEN    Culture   Final    ABUNDANT METHICILLIN RESISTANT STAPHYLOCOCCUS AUREUS NO ANAEROBES ISOLATED Performed at [facility], [address] [place], [place] [address]    Report Status 08/17/2024 FINAL  Final   Organism ID, Bacteria METHICILLIN RESISTANT STAPHYLOCOCCUS AUREUS  Final      Susceptibility   Methicillin resistant staphylococcus aureus - MIC*    CIPROFLOXACIN >=8 RESISTANT Resistant     ERYTHROMYCIN >=8 RESISTANT Resistant      GENTAMICIN <=0.5 SENSITIVE Sensitive     OXACILLIN >=4 RESISTANT Resistant     TETRACYCLINE <=1 SENSITIVE Sensitive     VANCOMYCIN 1 SENSITIVE Sensitive     TRIMETH /SULFA  80 RESISTANT Resistant     CLINDAMYCIN  <=0.25 SENSITIVE Sensitive     RIFAMPIN <=0.5 SENSITIVE Sensitive     Inducible Clindamycin  NEGATIVE Sensitive     LINEZOLID 2 SENSITIVE Sensitive     * ABUNDANT METHICILLIN RESISTANT STAPHYLOCOCCUS AUREUS  Aerobic/Anaerobic Culture w Gram Stain (surgical/deep wound)     Status: None   Collection Time: 08/12/24  9:55 AM   Specimen: Wound; Tissue  Result Value Ref Range Status   Specimen Description WOUND  Final   Special Requests LEFT  Final   Gram Stain NO WBC SEEN NO ORGANISMS SEEN   Final   Culture   Final    MODERATE METHICILLIN RESISTANT STAPHYLOCOCCUS AUREUS NO ANAEROBES ISOLATED Performed at [facility], [address] [place], [place] [address]    Report Status 08/17/2024 FINAL  Final   Organism ID, Bacteria METHICILLIN RESISTANT STAPHYLOCOCCUS AUREUS  Final      Susceptibility   Methicillin resistant staphylococcus aureus - MIC*    CIPROFLOXACIN >=8 RESISTANT Resistant     ERYTHROMYCIN >=8 RESISTANT Resistant     GENTAMICIN <=0.5 SENSITIVE Sensitive     OXACILLIN >=4 RESISTANT Resistant     TETRACYCLINE <=1 SENSITIVE Sensitive     VANCOMYCIN 1 SENSITIVE Sensitive     TRIMETH /SULFA  80 RESISTANT Resistant     CLINDAMYCIN  <=0.25 SENSITIVE Sensitive     RIFAMPIN <=0.5 SENSITIVE Sensitive     Inducible Clindamycin  NEGATIVE Sensitive     LINEZOLID 2 SENSITIVE Sensitive     * MODERATE METHICILLIN RESISTANT STAPHYLOCOCCUS AUREUS    Radiology Studies:  No results found.    Scheduled Meds:    amLODipine   10 mg Oral Daily   Chlorhexidine Gluconate Cloth  6 each Topical Daily   clonazePAM   1 mg Oral BID   docusate  100 mg Oral Daily   feeding supplement  237 mL Oral BID BM   folic acid   1 mg Oral Daily   gabapentin  200 mg Oral TID   heparin  injection (subcutaneous)  5,000 Units Subcutaneous Q8H   losartan  50 mg Oral Daily   lurasidone   40 mg Oral QPC supper   multivitamin with minerals  1 tablet Oral Daily   nicotine  14 mg Transdermal Daily   pantoprazole  40 mg Oral QHS   polyethylene glycol  17 g Oral Daily   senna  2 tablet Oral Daily   sodium chloride  flush  10-40 mL Intracatheter Q12H   thiamine   100 mg  Oral Daily    Continuous Infusions:    vancomycin 1,250 mg (08/20/24 0507)     LOS: 36 days     Trenda Mar, MD,  FACP, [facility], [facility], [facility]   [facility] [place]      To contact the attending provider between 7A-7P or the covering provider during after hours 7P-7A, please log into the web site [email or web address] and access using universal [place] password for that web site. If you do not have the password, please call the hospital operator.  08/20/2024, 12:35 PM

## 2024-08-20 NOTE — Progress Notes (Addendum)
 Disregard, trough to be drawn at 0530 AM Wednesday 11/19

## 2024-08-21 LAB — BASIC METABOLIC PANEL WITH GFR
Anion gap: 12 (ref 5–15)
BUN: 21 mg/dL — ABNORMAL HIGH (ref 6–20)
CO2: 22 mmol/L (ref 22–32)
Calcium: 9.5 mg/dL (ref 8.9–10.3)
Chloride: 105 mmol/L (ref 98–111)
Creatinine, Ser: 0.94 mg/dL (ref 0.61–1.24)
GFR, Estimated: >60 mL/min (ref >60)
Glucose, Bld: 98 mg/dL (ref 70–99)
Potassium: 4.5 mmol/L (ref 3.5–5.1)
Sodium: 139 mmol/L (ref 135–145)

## 2024-08-21 LAB — VANCOMYCIN, TROUGH: Vancomycin Tr: 24 ug/mL (ref 15–20)

## 2024-08-21 MED ORDER — HALOPERIDOL LACTATE 5 MG/ML IJ SOLN
1.0000 mg | Freq: Four times a day (QID) | INTRAMUSCULAR | Status: DC | PRN
  Administered 2024-08-21: 1 mg via INTRAVENOUS
  Filled 2024-08-21: qty 1

## 2024-08-21 MED ORDER — VANCOMYCIN HCL IN DEXTROSE 1-5 GM/200ML-% IV SOLN
1000.0000 mg | Freq: Two times a day (BID) | INTRAVENOUS | Status: DC
  Administered 2024-08-21 – 2024-08-30 (×20): 1000 mg via INTRAVENOUS
  Filled 2024-08-21 (×20): qty 200

## 2024-08-21 MED ORDER — LURASIDONE HCL 40 MG PO TABS
80.0000 mg | ORAL_TABLET | Freq: Every day | ORAL | Status: DC
  Administered 2024-08-21: 80 mg via ORAL
  Filled 2024-08-21: qty 2

## 2024-08-21 MED ORDER — CLONAZEPAM 0.5 MG PO TABS
0.5000 mg | ORAL_TABLET | Freq: Two times a day (BID) | ORAL | Status: DC
  Administered 2024-08-21 – 2024-08-27 (×12): 0.5 mg via ORAL
  Filled 2024-08-21 (×12): qty 1

## 2024-08-21 NOTE — Progress Notes (Addendum)
 PROGRESS NOTE   Ernest Romero  FMW:[id number]    DOB: 1966/04/07    DOA: 07/15/2024  PCP: [name]   I have briefly reviewed patients previous medical records in [facility].   Brief Hospital Course:  PCCM Xfer 29/34. 58 year old male, homeless, medical history significant for alcohol use disorder, bipolar and panic disorder, initially presented to [facility] from jail in September 2025 with a large subdural hematoma.  Underwent craniotomy with flap replacement on 10/27.  Since then multiple OR visits including 10/30 for SDH evacuation, follow-up CT head 11/8 showed fluid collection underneath the bone flap, back to OR on 11/10 and the bone flap was removed, cultures were obtained, intraoperative cultures grew MRSA, treating for infected bone flap, ID on board and currently on IV vancomycin .  Refer to PCCM progress note from 11/11 for detailed tabulation of hospital events until then.   Assessment & Plan:   Subdural hematoma  S/P initial craniotomy 9/28 (Dr. Loa poor barr)  S/P skull flap left cranioplasty 10/27 (Dr. Rosslyn).  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 - Post op care per Dr. Rosslyn with NSGY. Communicated with Dr.Janjua 11/18: Does not plan to put a bone flap back on him.  He plans to DC sutures next week.  Keppra  to stay off. - ID following, recommend PICC line (to be placed closer to time of discharge due to risk of patient pulling it out), OPAT orders placed by ID on 11/17, IV antibiotic end date 09/23/2024 (after IV antibiotics, ID recommends continuing treatment with doxycycline  to finish 8 weeks course), ID follow-up with Dr.Vu in clinic on 12/18 at 10 AM and SNF placement for same.   -As per review with pharmacy, completed 1 week Keppra  postop. - Therapies recommend SNF.  ICM on board for SNF placement.  As per ICM note from 11/14, pursuing LTAC, the discussed with [facility] and they were supposed to review and  follow-up. Patient is sleepy this morning.  Probably due to the dose of Haldol  he received earlier this morning.   Acute metabolic encephalopathy Per prior review of Dr. Rosario, [facility] MD note from 11/10, patient unable to make decisions about medical treatment due to impaired capacity, secondary to neurosurgical issues noted above and multifactorial. At baseline, patient reportedly without cognitive impairment, was homeless and independent caring for self. Patient's support counselor Larnell [name] has been visiting intermittently. [phone]).  Per information available to medical team, he does not have kids, his spouse passed away. Has sister, estranged.  Also per Dr. Donn note from 7/10, given his current deficits in planning/executive function/memory, patient is likely to need guardianship for decisions i.e. placement. Last week had issues with ongoing daytime sleepiness and intermittent agitation at home.  Reduced dose of as needed IV Haldol .  Adjusted Latuda  so it is given at 6 PM rather than at bedtime.   Patient remains on clonazepam  gabapentin .  Patient received 2 doses of Haldol  in the last 12 hours.  Will order EKG to check QTc.  Continue lurasidone .  Could consider increasing the dose.  Bipolar disorder - Continue Lurasidone , supportive care. - Lithium  was discontinued on 10/27 and has not been on this since then.  Unclear if he was taking this PTA.  Seems to be doing okay without it.  Continue to monitor off of lithium .   History of alcohol, tobacco use disorder - thiamine , folic acid , multivitamin  - Continue PTA Klonopin , dose reduced on 11/15. Treated for  alcohol withdrawal delirium with phenobarbital taper at [facility].  Alcohol withdrawal resolved.   Essential HTN Blood pressure is reasonably well-controlled.  Patient noted to be on amlodipine  and losartan.  Constipation - last BM 11/5. - Continue Colace scheduled and PRN, Senna scheduled change from 1 to 2 tabs. - Change  Miralax from PRN to daily. Having BMs.  LBM 11/16.  Nutrition Status: Nutrition Problem: Severe Malnutrition Etiology: acute illness Signs/Symptoms: energy intake < or equal to 50% for > or equal to 5 days, severe muscle depletion, moderate fat depletion Interventions: Tube feeding Agree with dietitian evaluation and management.  Body mass index is 20.85 kg/m.   DVT prophylaxis: Subcutaneous heparin   Code Status: Full Code:  Family Communication: None at bedside. Disposition: LTAC being pursued     Consultants:   PCCM Neurology Neurosurgery Psychiatry Infectious disease  Procedures:   As noted above  Subjective:  Patient asleep this morning.  Received Haldol  a few hours prior to this assessment.    Objective:   Vitals:   08/20/24 1949 08/21/24 0013 08/21/24 0102 08/21/24 0400  BP: 121/82 115/79  111/79  Pulse: 69 64  64  Resp: (!) 21 (!) 24  15  Temp: 98 F (36.7 C) 97.8 F (36.6 C)  97.6 F (36.4 C)  TempSrc: Oral Axillary  Axillary  SpO2: 91% 96%  99%  Weight:  65.9 kg 65.9 kg   Height:        General appearance: Asleep.  In no distress Resp: Clear to auscultation bilaterally.  Normal effort Cardio: S1-S2 is normal regular.  No S3-S4.  No rubs murmurs or bruit GI: Abdomen is soft.  Nontender nondistended.  Bowel sounds are present normal.  No masses organomegaly Extremities: No edema.     Data Reviewed:     CBC: Recent Labs  Lab 08/15/24 0330 08/18/24 0610  WBC 4.9 5.2  HGB 11.0* 11.8*  HCT 32.7* 34.7*  MCV 89.6 88.3  PLT 348 351    Basic Metabolic Panel: Recent Labs  Lab 08/15/24 0330 08/18/24 0610 08/21/24 0351  NA 140 139 139  K 3.6 4.0 4.5  CL 109 105 105  CO2 22 25 22   GLUCOSE 95 114* 98  BUN 14 20 21*  CREATININE 0.91 0.82 0.94  CALCIUM 9.1 9.0 9.5     Microbiology Studies:   Recent Results (from the past 240 hours)  Aerobic/Anaerobic Culture w Gram Stain (surgical/deep wound)     Status: None   Collection Time:  08/12/24  9:54 AM   Specimen: Wound; Tissue  Result Value Ref Range Status   Specimen Description WOUND  Final   Special Requests LEFT  Final   Gram Stain   Final    RARE WBC PRESENT, PREDOMINANTLY PMN NO ORGANISMS SEEN    Culture   Final    ABUNDANT METHICILLIN RESISTANT STAPHYLOCOCCUS AUREUS NO ANAEROBES ISOLATED Performed at [facility], [address], [place], [place] [address]    Report Status 08/17/2024 FINAL  Final   Organism ID, Bacteria METHICILLIN RESISTANT STAPHYLOCOCCUS AUREUS  Final      Susceptibility   Methicillin resistant staphylococcus aureus - MIC*    CIPROFLOXACIN >=8 RESISTANT Resistant     ERYTHROMYCIN >=8 RESISTANT Resistant     GENTAMICIN <=0.5 SENSITIVE Sensitive     OXACILLIN >=4 RESISTANT Resistant     TETRACYCLINE <=1 SENSITIVE Sensitive     VANCOMYCIN 1 SENSITIVE Sensitive     TRIMETH /SULFA  80 RESISTANT Resistant     CLINDAMYCIN  <=  0.25 SENSITIVE Sensitive     RIFAMPIN <=0.5 SENSITIVE Sensitive     Inducible Clindamycin  NEGATIVE Sensitive     LINEZOLID 2 SENSITIVE Sensitive     * ABUNDANT METHICILLIN RESISTANT STAPHYLOCOCCUS AUREUS  Aerobic/Anaerobic Culture w Gram Stain (surgical/deep wound)     Status: None   Collection Time: 08/12/24  9:55 AM   Specimen: Wound; Tissue  Result Value Ref Range Status   Specimen Description WOUND  Final   Special Requests LEFT  Final   Gram Stain NO WBC SEEN NO ORGANISMS SEEN   Final   Culture   Final    MODERATE METHICILLIN RESISTANT STAPHYLOCOCCUS AUREUS NO ANAEROBES ISOLATED Performed at [facility], [address], [place], [place] [address]    Report Status 08/17/2024 FINAL  Final   Organism ID, Bacteria METHICILLIN RESISTANT STAPHYLOCOCCUS AUREUS  Final      Susceptibility   Methicillin resistant staphylococcus aureus - MIC*    CIPROFLOXACIN >=8 RESISTANT Resistant     ERYTHROMYCIN >=8 RESISTANT Resistant     GENTAMICIN <=0.5 SENSITIVE Sensitive     OXACILLIN >=4 RESISTANT Resistant      TETRACYCLINE <=1 SENSITIVE Sensitive     VANCOMYCIN 1 SENSITIVE Sensitive     TRIMETH /SULFA  80 RESISTANT Resistant     CLINDAMYCIN  <=0.25 SENSITIVE Sensitive     RIFAMPIN <=0.5 SENSITIVE Sensitive     Inducible Clindamycin  NEGATIVE Sensitive     LINEZOLID 2 SENSITIVE Sensitive     * MODERATE METHICILLIN RESISTANT STAPHYLOCOCCUS AUREUS    Radiology Studies:  No results found.    Scheduled Meds:    amLODipine   10 mg Oral Daily   Chlorhexidine Gluconate Cloth  6 each Topical Daily   clonazePAM   1 mg Oral BID   docusate  100 mg Oral Daily   feeding supplement  237 mL Oral BID BM   folic acid   1 mg Oral Daily   gabapentin  200 mg Oral TID   heparin injection (subcutaneous)  5,000 Units Subcutaneous Q8H   losartan  50 mg Oral Daily   lurasidone   40 mg Oral QPC supper   multivitamin with minerals  1 tablet Oral Daily   nicotine  14 mg Transdermal Daily   pantoprazole  40 mg Oral QHS   polyethylene glycol  17 g Oral Daily   senna  2 tablet Oral Daily   sodium chloride  flush  10-40 mL Intracatheter Q12H   thiamine   100 mg Oral Daily    Continuous Infusions:    vancomycin 1,000 mg (08/21/24 0853)     LOS: 37 days     Joette Pebbles, MD,    To contact the attending provider between 7A-7P or the covering provider during after hours 7P-7A, please log into the web site [email or web address] and access using universal [place] password for that web site. If you do not have the password, please call the hospital operator.  08/21/2024, 11:00 AM

## 2024-08-21 NOTE — TOC Progression Note (Signed)
 Transition of Care [facility]) - Progression Note    Patient Details  Name: Ernest Romero MRN: [id number] Date of Birth: Jul 20, 1966  Transition of Care [facility]) CM/SW Contact  Montie LOISE Louder, [place] Phone Number: 08/21/2024, 11:20 AM  Clinical Narrative:     Patient has not bed offers  Spoke with [place] APS SW Fernando Daring, she reports she will be visiting with patient today. She states they are still assessing if they need to pursue guardianship, no determination has been made at this time.  TOC will continue to follow and assist with discharge planning.   Montie Louder, MSW, LCSW Clinical Social Worker    Expected Discharge Plan: Skilled Nursing Facility Barriers to Discharge: Continued Medical Work up               Expected Discharge Plan and Services   Discharge Planning Services: CM Consult                                           Social Drivers of Health (SDOH) Interventions SDOH Screenings   Food Insecurity: Patient Unable To Answer (07/16/2024)  Housing: Unknown (07/16/2024)  Transportation Needs: Patient Unable To Answer (07/16/2024)  Utilities: Patient Unable To Answer (07/16/2024)  Alcohol Screen: Low Risk  (08/30/2022)  Depression (PHQ2-9): Medium Risk (08/30/2022)  Tobacco Use: Medium Risk (08/12/2024)    Readmission Risk Interventions    07/16/2024   12:23 PM  Readmission Risk Prevention Plan  Transportation Screening Complete  Medication Review (RN Care Manager) Complete  PCP or Specialist appointment within 3-5 days of discharge Complete  HRI or Home Care Consult Complete  SW Recovery Care/Counseling Consult Complete  Palliative Care Screening Not Applicable  Skilled Nursing Facility Not Applicable

## 2024-08-21 NOTE — TOC Progression Note (Signed)
 Transition of Care [facility]) - Progression Note    Patient Details  Name: Ernest Romero MRN: [id number] Date of Birth: April 25, 1966  Transition of Care [name]) CM/SW Contact  Timeka Goette M, RN Phone Number: 08/21/2024, 2:19 PM  Clinical Narrative:    Letter requested by Rikki Essex, Nurse Navigator from [facility]; she states patient's probation officer is requesting a letter stating that patient remains hospitalized.  They also want to know what the plan of care is for patient.   Letter prepared as requested and emailed to Officer Michaelle Favors [email or web address] CC Tiffanie Essex @ [email or web address].    Expected Discharge Plan: Skilled Nursing Facility Barriers to Discharge: Continued Medical Work up               Expected Discharge Plan and Services   Discharge Planning Services: CM Consult                                           Social Drivers of Health (SDOH) Interventions SDOH Screenings   Food Insecurity: Patient Unable To Answer (07/16/2024)  Housing: Unknown (07/16/2024)  Transportation Needs: Patient Unable To Answer (07/16/2024)  Utilities: Patient Unable To Answer (07/16/2024)  Alcohol Screen: Low Risk  (08/30/2022)  Depression (PHQ2-9): Medium Risk (08/30/2022)  Tobacco Use: Medium Risk (08/12/2024)    Readmission Risk Interventions    07/16/2024   12:23 PM  Readmission Risk Prevention Plan  Transportation Screening Complete  Medication Review (RN Care Manager) Complete  PCP or Specialist appointment within 3-5 days of discharge Complete  HRI or Home Care Consult Complete  SW Recovery Care/Counseling Consult Complete  Palliative Care Screening Not Applicable  Skilled Nursing Facility Not Applicable   Mliss MICAEL Fass, RN, BSN  Trauma/Neuro ICU Case Manager [phone]

## 2024-08-21 NOTE — Progress Notes (Signed)
 Date and time results received: 08/21/24 0532  Test: Vanc Tr Critical Value: 24  Name of Provider Notified: DOROTHA Kipper, NP  Orders Received? Or Actions Taken?: Actions Taken: Notified on call provider, pharmacy will follow up

## 2024-08-21 NOTE — Progress Notes (Signed)
 Physical Therapy Treatment Patient Details Name: Ernest Romero MRN: [id number] DOB: [birth year] Today's Date: 08/21/2024   History of Present Illness Pt is a 58 y.o. male presenting 10/14 from [facility] where he was admitted 9/28 for unresponsive episode in the jail. 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 On CIWA protocol. Transferred to [facility] for further management of craniotomy and flap. 10/27 s/p cranioplasty with bone flap replacement. 10/28 Change in status thought to be related to Klonopin  administration 10/28, repeat CTH shows significant increase in size of mixed attenuation subdural fluid collection underlying the L tempoparietal craniotomy site, now measuring approximately 15 mm in thickness, with associated mild mass effect and slight rightward midline shift. OR on 10/30 for SDH evacuation. [facility] 11/7 with fluid collected under bone flap. 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 PMH alcohol use disorder, psychiatric disorder, HTN    PT Comments  Pt getting up with NT upon arrival to room, agreeable to gait training in hallway with PT. Pt with more imbalance today, x2 episodes of LOB related to scissoring gait requiring significant PT assist to correct. Pt with poor awareness of this, and remains flat affect throughout. Pt also limited by posterior bias and bilat knee flexion in stance, especially with fatigue. Pt progressing slowly.      If plan is discharge home, recommend the following: A lot of help with bathing/dressing/bathroom;Assistance with cooking/housework;Direct supervision/assist for medications management;Direct supervision/assist for financial management;Assist for transportation;Help with stairs or ramp for entrance;Supervision due to cognitive status;A little help with walking and/or transfers   Can travel by private vehicle        Equipment Recommendations  Wheelchair  (measurements PT);Wheelchair cushion (measurements PT);Rolling walker (2 wheels)    Recommendations for Other Services       Precautions / Restrictions Precautions Precautions: Fall Recall of Precautions/Restrictions: Impaired Precaution/Restrictions Comments: SBP <160, helmet (pt to wear when OOB per RN) Restrictions Weight Bearing Restrictions Per Provider Order: No     Mobility  Bed Mobility Overal bed mobility: Needs Assistance Bed Mobility: Sit to Supine       Sit to supine: Mod assist, HOB elevated, Used rails   General bed mobility comments: OOB with NT upon PT arrival to room. assist for LE lift into bed, trunk lowering, repositioning.    Transfers Overall transfer level: Needs assistance Equipment used: 1 person hand held assist Transfers: Sit to/from Stand Sit to Stand: Min assist           General transfer comment: assist for stand>sit for slow eccentric lower and correcting posterior truncal bias    Ambulation/Gait Ambulation/Gait assistance: Min assist, Mod assist Gait Distance (Feet): 250 Feet Assistive device: 1 person hand held assist Gait Pattern/deviations: Step-through pattern, Decreased stride length, Narrow base of support, Trunk flexed, Scissoring, Drifts right/left, Knee flexed in stance - left, Knee flexed in stance - right Gait velocity: decr     General Gait Details: periods of mod truncal assist to correct LOB x2, both LOB occurring with scissoring. multimodal cuing for upright trunk as pt with heavy posterior bias, widening BOS with cues as wide as the floor tiles   Stairs             Wheelchair Mobility     Tilt Bed    Modified Rankin (Stroke Patients Only)       Balance Overall balance assessment: Needs assistance Sitting-balance support: Feet supported, Bilateral  upper extremity supported Sitting balance-Leahy Scale: Poor Sitting balance - Comments: posterior bias requiring PT assist to correct Postural control:  Posterior lean Standing balance support: Reliant on assistive device for balance, Single extremity supported Standing balance-Leahy Scale: Poor Standing balance comment: posterior bias and min LE buckling (pt-corrected) with fatigue, x2 LOB today requiring PT assist to correct                            Communication Communication Communication: Impaired Factors Affecting Communication: Difficulty expressing self  Cognition Arousal: Alert Behavior During Therapy: Flat affect   PT - Cognitive impairments: Awareness, Memory, Attention, Problem solving, Safety/Judgement, Initiation                   [place] Levels of Cognitive Functioning [place] Mirant Scales of Cognitive Functioning: Confused, Appropriate: Moderate Assistance [email or web address] Scales of Cognitive Functioning: Confused, Appropriate: Moderate Assistance [VI] PT - Cognition Comments: pt perservative on wanting shoes, states I could walk better with shoes and states he can have someone bring him some into the hospital. Pt with very flat affect, does follow commands well with increased time and multimodal cuing Following commands: Impaired Following commands impaired: Follows one step commands with increased time    Cueing Cueing Techniques: Verbal cues  Exercises      General Comments        Pertinent Vitals/Pain Pain Assessment Pain Assessment: No/denies pain Pain Intervention(s): Monitored during session    Home Living                          Prior Function            PT Goals (current goals can now be found in the care plan section) Acute Rehab PT Goals Patient Stated Goal: none stated PT Goal Formulation: With patient Time For Goal Achievement: 08/19/24 Potential to Achieve Goals: Fair Progress towards PT goals: Progressing toward goals    Frequency    Min 2X/week      PT Plan      Co-evaluation              AM-PAC PT 6 Clicks Mobility   Outcome  Measure  Help needed turning from your back to your side while in a flat bed without using bedrails?: A Little Help needed moving from lying on your back to sitting on the side of a flat bed without using bedrails?: A Little Help needed moving to and from a bed to a chair (including a wheelchair)?: A Little Help needed standing up from a chair using your arms (e.g., wheelchair or bedside chair)?: A Little Help needed to walk in hospital room?: A Little Help needed climbing 3-5 steps with a railing? : A Lot 6 Click Score: 17    End of Session Equipment Utilized During Treatment: Gait belt;Other (comment) (helmet) Activity Tolerance: Patient limited by fatigue Patient left: with call bell/phone within reach;in bed;with bed alarm set Nurse Communication: Mobility status PT Visit Diagnosis: Other symptoms and signs involving the nervous system (R29.898);Muscle weakness (generalized) (M62.81)     Time: 1220-1230 PT Time Calculation (min) (ACUTE ONLY): 10 min  Charges:    $Gait Training: 8-22 mins PT General Charges $$ ACUTE PT VISIT: 1 Visit                     Jerman Tinnon S, PT DPT Acute Rehabilitation Services Secure Chat Preferred  Office [phone]    Labrina Lines E Stroup 08/21/2024, 4:45 PM

## 2024-08-21 NOTE — Progress Notes (Addendum)
 Pharmacy Antibiotic Note  Ernest Romero is a 58 y.o. male admitted on 07/15/2024 with bone flap infection s/p cranioplasty.  Pharmacy has been consulted for vancomycin dosing.  Utilizing traditional trough dosing due to CNS involvement.   11/19 AM update:  Vancomycin trough is above goal at 24 Renal function appears stable  Plan: Change vancomycin to 1000 mg IV every 12 hours, Goal trough 15-20 mcg/mL. Repeat levels PRN Watch renal function   Height: 5' 10 (177.8 cm) Weight: 65.9 kg (145 lb 4.5 oz) IBW/kg (Calculated) : 73  Temp (24hrs), Avg:97.8 F (36.6 C), Min:97.6 F (36.4 C), Max:98 F (36.7 C)  Recent Labs  Lab 08/15/24 0330 08/16/24 0731 08/18/24 0610 08/18/24 1619 08/21/24 0351  WBC 4.9  --  5.2  --   --   CREATININE 0.91  --  0.82  --  0.94  VANCOTROUGH  --    < >  --  11* 24*   < > = values in this interval not displayed.    Estimated Creatinine Clearance: 77.9 mL/min (by C-G formula based on SCr of 0.94 mg/dL).    No Known Allergies  Lynwood Mckusick, PharmD, BCPS Clinical Pharmacist Phone: [phone]

## 2024-08-22 DIAGNOSIS — R251 Tremor, unspecified: Secondary | ICD-10-CM

## 2024-08-22 DIAGNOSIS — A4902 Methicillin resistant Staphylococcus aureus infection, unspecified site: Secondary | ICD-10-CM

## 2024-08-22 LAB — VANCOMYCIN, TROUGH: Vancomycin Tr: 19 ug/mL (ref 15–20)

## 2024-08-22 MED ORDER — PROPRANOLOL HCL 10 MG PO TABS
10.0000 mg | ORAL_TABLET | Freq: Three times a day (TID) | ORAL | Status: DC
  Administered 2024-08-22: 10 mg via ORAL
  Filled 2024-08-22 (×4): qty 1

## 2024-08-22 MED ORDER — LURASIDONE HCL 20 MG PO TABS
20.0000 mg | ORAL_TABLET | Freq: Every day | ORAL | Status: DC
  Administered 2024-08-22: 20 mg via ORAL
  Filled 2024-08-22: qty 1

## 2024-08-22 MED ORDER — BENZTROPINE MESYLATE 1 MG/ML IJ SOLN
2.0000 mg | Freq: Once | INTRAMUSCULAR | Status: AC
  Administered 2024-08-22: 2 mg via INTRAVENOUS
  Filled 2024-08-22: qty 2

## 2024-08-22 MED ORDER — LORAZEPAM 2 MG/ML IJ SOLN
0.5000 mg | Freq: Four times a day (QID) | INTRAMUSCULAR | Status: DC | PRN
  Administered 2024-08-22 – 2024-09-14 (×36): 0.5 mg via INTRAVENOUS
  Filled 2024-08-22 (×36): qty 1

## 2024-08-22 MED ORDER — LORAZEPAM 2 MG/ML IJ SOLN
0.5000 mg | Freq: Once | INTRAMUSCULAR | Status: AC
  Administered 2024-08-22: 0.5 mg via INTRAVENOUS
  Filled 2024-08-22: qty 1

## 2024-08-22 MED ORDER — LURASIDONE HCL 40 MG PO TABS: 80.0000 mg | ORAL_TABLET | Freq: Every day | ORAL | Status: DC

## 2024-08-22 MED ORDER — PROPRANOLOL HCL 10 MG PO TABS
10.0000 mg | ORAL_TABLET | Freq: Three times a day (TID) | ORAL | Status: DC
  Administered 2024-08-22 – 2024-08-23 (×3): 10 mg via ORAL
  Filled 2024-08-22 (×3): qty 1

## 2024-08-22 NOTE — Progress Notes (Signed)
 Pharmacy Antibiotic Note  Ernest Romero is a 58 y.o. male admitted on 07/15/2024 with bone flap infection s/p cranioplasty.  Pharmacy has been consulted for vancomycin  dosing.  Utilizing traditional trough dosing due to CNS involvement.   Vancomycin  trough therapeutic   Plan: Continue Vancomycin  at 1000 mg iv Q 12 hours Continue to follow.  Height: 5' 10 (177.8 cm) Weight: 62.3 kg (137 lb 5.6 oz) IBW/kg (Calculated) : 73  Temp (24hrs), Avg:97.9 F (36.6 C), Min:97.5 F (36.4 C), Max:98.4 F (36.9 C)  Recent Labs  Lab 08/18/24 0610 08/18/24 1619 08/21/24 0351 08/22/24 2109  WBC 5.2  --   --   --   CREATININE 0.82  --  0.94  --   VANCOTROUGH  --    < > 24* 19   < > = values in this interval not displayed.    Estimated Creatinine Clearance: 73.6 mL/min (by C-G formula based on SCr of 0.94 mg/dL).    No Known Allergies   Thank you for allowing pharmacy to be a part of this patient's care. Olam Monte, PharmD 08/22/2024 10:24 PM

## 2024-08-22 NOTE — Consult Note (Addendum)
 [facility] Psychiatric Consult Follow-up  Patient Name: .Ernest Romero  MRN: [id number]  DOB: 01/25/1966  Consult Order details:  Orders (From admission, onward)     Start     Ordered   08/22/24 0842  IP CONSULT TO PSYCHIATRY       Ordering Provider: Verdene Purchase, MD  Provider:  (Not yet assigned)  Question Answer Comment  Location [facility]   Reason for Consult? patient with h/o bipolar d/o. was on lithium . here with subdural hematoma and MRSA. improving. some facial trmors. Need help with medication management. he was on lithium , lurasidone . requiring haldol  prn.  thanks      08/22/24 0842   08/02/24 1712  IP CONSULT TO PSYCHIATRY       Ordering Provider: Adolph Tinnie FORBES, PA-C  Provider:  (Not yet assigned)  Question Answer Comment  Location [facility]   Reason for Consult? bipolar      08/02/24 1711   07/24/24 1656  IP CONSULT TO PSYCHIATRY       Ordering Provider: Patsy Lenis, MD  Provider:  (Not yet assigned)  Question Answer Comment  Location [facility]   Reason for Consult? needing formal eval for capacity; may need guardianship given no family/support for discharge      07/24/24 1656   07/16/24 1412  IP CONSULT TO PSYCHIATRY       Ordering Provider: Dino Antu, MD  Provider:  (Not yet assigned)  Question Answer Comment  Location [facility]   Reason for Consult? Homicidal/suicidal comments, bipolar disorder      07/16/24 1412             Mode of Visit: In person    Psychiatry Consult Evaluation  Service Date: August 22, 2024 LOS:  LOS: 38 days  Chief Complaint severe anxiety, mouth tremors hand tremors, severe restlessness that start after admission and worsened since yesterday  Primary Psychiatric Diagnoses  Akithesia v Drug induced parksonsism 2.  MDD, recurrent, severe, without psychotic features 3.  Stimulant use disorder, in controlled setting Rule out  traumatic brain injury from chronic subdural hematomas and neurosurgical procedures and complications Rule out bipolar disorder but likely previous manic episodes were meth induced per chart review  Assessment  Ernest Romero is a 58 y.o. male admitted: Medicallyfor 07/15/2024 11:15 PM for recurrent subdural hematoma. He carries the psychiatric diagnoses of bipolar disorder and alcohol use disorder and has a past medical history of subdural hematoma status postcraniotomy.   Current presentation with worsening anxiety, restlessness, dyskinetic movements of face and hands is most likely consistent with akathisia as these experience started on admission when he was restarted on Latuda  and worsened since increasing Latuda  to 80 mg.  Plan to decrease Latuda  to 20 mg to decrease akathisia/EPS risk while also avoiding withdrawal syndrome from the Latuda ; after discussion of this plan with patient he wanted to discontinue immediately.  Patient received Ativan  that is already ordered as well as her propranolol dose once at time of interview due to concern from akathisia.  We will assess his response to these medications and also trial dose of benztropine IV 2 mg at 1500 once the response from the ativan /propranolol already given has been observed.   After trialing Ativan  0.5 which was already in place by primary team PRN, patient had some response and improvement in his dyskinetic movements and perceived anxiety.  Patient was then trialed after some washout time on benztropine 2 mg  IV and did not have improvement in his symptoms and some side effects of disorientation after dose.  Failed trial to benztropine decreases likelihood of drug-induced parkinsonism.  Given suspicion for akathisia we will also trial patient on propranolol to assess if he has a response to this medication.  Patient is already on clonazepam  and reasonable to continue this given current akathisia but could consider de-escalating in the future  as patient has well-documented history of delirium in the hospital.   Furthermore, mirtazapine is reasonable to start as patient is having significant depression, has evidence for panic disorder which patient reports history of, has evidence for akathisia (see reference below), helps with insomnia which he is experiencing, helps with appetite which is poor for him currently, and has lower risk of switching if patient does have true bipolar disorder.  On chart review patient does not have any documented manic like episodes that did not involve a positive amphetamine suggesting that patient is presenting with meth intoxication.  Patient's historical manic like episodes which occurred outside of substance use do not appear consistent with a true manic episode as he is only endorsing increased mood, decreased sleep, and no associated dysfunction or increased activity levels or other features of mania.  No family history of bipolar disorder.  Of note patient may also have some changes in his baseline mentation due to the recurrent subdural hematomas and the neurosurgical procedures and complications which in itself can be risk factors for traumatic brain injury sequelae which may be also affecting his functioning.  Furukawa Y, Imai K, Takahashi Y, Efthimiou O, Leucht S. Comparative Efficacy and Acceptability of Treatment Strategies for Antipsychotic-Induced Akathisia: A Systematic Review and Network Meta-analysis. Schizophr Bull. 2024 Jun 13:sbae098. doi: 10.1093/schbul/sbae098. Epub ahead of print. PMID: 61130822.  Diagnoses:  Active Hospital problems: Principal Problem:   Subdural hematoma (HCC) Active Problems:   Bipolar disorder (HCC)   Essential hypertension   Alcohol withdrawal (HCC)   Unable to make decisions about medical treatment due to impaired mental capacity   Protein-calorie malnutrition, severe   Acute metabolic encephalopathy   Infection of craniotomy plate    Plan   ## Psychiatric  Medication Recommendations:  Decrease Latuda  to 20 mg and after discussion patient he wants to discontinue after this dose Start mirtazapine 15 mg once daily at bedtime for akathisia, insomnia, mood; monitor for mania switching but has lower risk of manic switch than other antidepressant if true bipolar Defer continuing benztropine for akithesia due to failed trial on 2 mg IV once Start propranolol 10 mg 3 times daily for akathisia, anxiety, TBI; monitor response consider titrating as tolerated Reasonable to continue clonazepam  in the setting of current akathesia but could consider titrating off this medication in the future especially if having delirium  Consider pregabalin for TBI / anxiety in future  ## Medical Decision Making Capacity: Not specifically addressed in this encounter  ## Further Work-up:  -- TSH 1 month ago 0.775 While pt on Qtc prolonging medications, please monitor & replete K+ to 4 and Mg2+ to 2 -- most recent EKG on 11/19 had QtC of 414 -- Pertinent labwork reviewed earlier this admission includes: GFR greater than 60, LFT relatively within normal limits mildly elevated ALT, hemoglobin 11-12   ## Disposition:-- need to treat akithesia prior to discharge given substant distress and higher risk for suicide attempt with adra  ## Behavioral / Environmental: -Delirium Precautions: Delirium Interventions for Nursing and Staff: - RN to open blinds every AM. - To Bedside:  Glasses, hearing aide, and pt's own shoes. Make available to patients. when possible and encourage use. - Encourage po fluids when appropriate, keep fluids within reach. - OOB to chair with meals. - Passive ROM exercises to all extremities with AM & PM care. - RN to assess orientation to person, time and place QAM and PRN. - Recommend extended visitation hours with familiar family/friends as feasible. - Staff to minimize disturbances at night. Turn off television when pt asleep or when not in use.    ##  Safety and Observation Level:  - Based on my clinical evaluation, I estimate the patient to be at low risk of self harm in the current setting. - At this time, we recommend  routine. This decision is based on my review of the chart including patient's history and current presentation, interview of the patient, mental status examination, and consideration of suicide risk including evaluating suicidal ideation, plan, intent, suicidal or self-harm behaviors, risk factors, and protective factors. This judgment is based on our ability to directly address suicide risk, implement suicide prevention strategies, and develop a safety plan while the patient is in the clinical setting. Please contact our team if there is a concern that risk level has changed.  CSSR Risk Category:C-SSRS RISK CATEGORY: No Risk  Suicide Risk Assessment: Patient has following modifiable risk factors for suicide: untreated depression, which we are addressing by medication adjustments. Patient has following non-modifiable or demographic risk factors for suicide: Male and psychiatric hospitalization Patient has the following protective factors against suicide: n/a  Thank you for this consult request. Recommendations have been communicated to the primary team.  We will continue to follow at this time.   Justino Cornish, MD       History of Present Illness  Relevant Aspects of [facility] Course per hospitalist:  PCCM Xfer 91/35. 58 year old male, homeless, medical history significant for alcohol use disorder, bipolar and panic disorder, initially [facility] jail in September 2025 with a large subdural hematoma.  Underwent craniotomy with flap replacement on 10/27.  Since then multiple OR visits including 10/30 for [facility] evacuation, follow-up CT head 11/8 showed fluid collection underneath the bone flap, back to OR on 11/10 and the bone flap was removed, cultures were obtained, intraoperative cultures grew MRSA, treating  for infected bone flap, ID on board and currently on IV vancomycin. Refer to PCCM progress note from 11/11 for detailed tabulation of hospital events until then.  Patient Report:  Reports that he is feeling very anxious and has been having some shaking in his mouth and his arms since coming to the hospital.  Reported that it got worse over the last day.  Reports that he has not experienced this in the past.  Reports that he had not been taking the medications prior to admission.  Does not report any specific triggers for the shaking.  Alongside the shaking he has a sensation of restlessness.  Psych ROS:  Depression: Reports he has been having depressed mood, anhedonia, worthlessness, at lowest in his life, but denies suicidal thoughts but does have morbid rumination Anxiety: Reports that he is an anxious person but that the anxiety has been much worse since being the hospital.  Says that he feels restless and is having issues sleeping because of it.  Reports that he is anxious about his situation where he will go from here. Mania (lifetime and current): Endorses having manic episodes but when asking about these episodes he does not describe any increased goal-directed  activity, denies any decreased need for sleep, but does endorse feeling better, talking more, and impulsivity.  Denies having any dysfunction during these times.  Denies having any distractibility, grandiosity, flight of ideas, risky behaviors. Psychosis: (lifetime and current): Denies AVH or paranoia. Sleep: Reports that he cannot sleep with the shaking and in general   Psychiatric and Social History  Psychiatric History:  Information collected from patient and chart review  Prev Dx/Sx: Bipolar disorder, methamphetamine use, substance-induced mood disorder, alcohol use disorder Current Psych Provider: None Home Meds (current): Restarted on Latuda  40 and titrated to 80 this hospitalization, was not taking anything prior to  admission in the short period that  He was away from hospital.  Also restarted on Klonopin  Previous Med Trials: Lithium , Klonopin , Latuda , Haldol  as needed for agitation Therapy: None  Prior Psych Hospitalization: Previous hospitalizations although the psychiatric hospitalizations are not documented but the emergency department visits prior to have been documented and include episodes documented as mania but are occurring in the presence of positive amphetamine Prior Self Harm: Denies Prior Violence: Denies  Family Psych History: Denies Family Hx suicide: Denies  Social History:  Reports that he has been experiencing homelessness.  Does not report any recent incarceration which appears to conflict with previous chart review.  Does report he has a upcoming court date on December 7.  Denies access to weapons.  Reports that his hobby is making rock music.  Reports that he has a friend who is his international aid/development worker. Access to weapons/lethal means: Denies  Substance History Denies using any substances recently.  Has been in controlled setting.  Does have history of using stimulants but he denies when asked during interview.  Exam Findings  Physical Exam:  Vital Signs:  Temp:  [97.5 F (36.4 C)-97.9 F (36.6 C)] 97.5 F (36.4 C) (11/20 1543) Pulse Rate:  [64-72] 72 (11/20 1543) Resp:  [18-24] 18 (11/20 1543) BP: (98-129)/(73-97) 113/83 (11/20 1543) SpO2:  [95 %-97 %] 97 % (11/20 1543) Weight:  [62.3 kg] 62.3 kg (11/20 0500) Blood pressure 113/83, pulse 72, temperature (!) 97.5 F (36.4 C), temperature source Oral, resp. rate 18, height 5' 10 (1.778 m), weight 62.3 kg, SpO2 97%. Body mass index is 19.71 kg/m.    Mental Status Exam: General Appearance: Patient is present in his room sitting in bed unable to move himself very well, blinds are closed, laying in clean gown, neurosurgical scar on head,  Orientation: Oriented to person place but not time  Memory: Did not formally assess   Concentration: Did not formally assess but grossly intact  Recall: Grossly intact but did not formally assess  Attention grossly intact but not formally assessed  Eye Contact: Good  Speech: Fair, dysarthric  Language: Fair  Volume: Normal  Mood: Lowest point my life  Affect: Depressed, flat, anxious  Thought Process: Coherent linear  Thought Content: Logical.  Morbid ruminations.  Worried about current situation and future.  Suicidal Thoughts:  No  Homicidal Thoughts:  No  Judgement:  Fair  Insight:  Fair  Psychomotor Activity:  Increased, dyskinetic movements of mouth and hands.  With mouth movement is sliding jaw back and forth.  There is some voluntary inhibition but is overall involuntary when he is not overriding.   Akathisia:  Yes  Fund of Knowledge:  Fair      Assets:  Desire for Improvement  Cognition: Did not formally assess  ADL's: Impaired by patient's movement limitations but is improving  AIMS (if indicated):  0  Other History   These have been pulled in through the EMR, reviewed, and updated if appropriate.  Family History:  The patient's family history is not on file.  Medical History: Past Medical History:  Diagnosis Date   Anxiety    Bipolar 1 disorder (HCC)    Depression    Hepatitis    Hypertension    MRSA (methicillin resistant Staphylococcus aureus)    5/25 arm abscess and 11/25    Surgical History: Past Surgical History:  Procedure Laterality Date   BURR HOLE N/A 08/01/2024   Procedure: CREATION, CRANIAL BURR HOLE WITH EVACUATION OF HEMATOMA;  Surgeon: Rosslyn Dino HERO, MD;  Location: [facility] OR;  Service: Neurosurgery;  Laterality: N/A;   BURR HOLE N/A 08/12/2024   Procedure: left bone flap removal;  Surgeon: Rosslyn Dino HERO, MD;  Location: [facility];  Service: Neurosurgery;  Laterality: N/A;   CRANIOPLASTY, WITH BONE FLAP REPLACEMENT Left 07/29/2024   Procedure: CRANIOPLASTY, WITH BONE FLAP REPLACEMENT;  Surgeon: Rosslyn Dino HERO, MD;   Location: [facility] OR;  Service: Neurosurgery;  Laterality: Left;  LEFT CRANIOPLASTY WITH ARTIFICAL BONE FLAP REPLACEMENT   CRANIOTOMY Left 06/30/2024   Procedure: CRANIOTOMY HEMATOMA EVACUATION SUBDURAL;  Surgeon: Melonie Grass, MD;  Location: [facility] ORS;  Service: Neurosurgery;  Laterality: Left;     Medications:   Current Facility-Administered Medications:    acetaminophen  (TYLENOL ) tablet 650 mg, 650 mg, Oral, Q4H PRN, 650 mg at 08/20/24 1718 **OR** [PENDING] acetaminophen  (TYLENOL ) tablet 650 mg, 650 mg, Per Tube, Q4H PRN **OR** acetaminophen  (TYLENOL ) suppository 650 mg, 650 mg, Rectal, Q4H PRN **OR** [PENDING] acetaminophen  (TYLENOL ) suppository 650 mg, 650 mg, Rectal, Q4H PRN,    amLODipine  (NORVASC ) tablet 10 mg, 10 mg, Oral, Daily, Ogbata, Sylvester I, MD, 10 mg at 08/22/24 0803   Chlorhexidine  Gluconate Cloth 2 % PADS 6 each, 6 each, Topical, Daily, Franky Redia SAILOR, MD, 6 each at 08/22/24 0803   clonazePAM  (KLONOPIN ) tablet 0.5 mg, 0.5 mg, Oral, BID, Krishnan, Gokul, MD, 0.5 mg at 08/22/24 0803   docusate (COLACE) 50 MG/5ML liquid 100 mg, 100 mg, Oral, Daily, Hongalgi, Anand D, MD, 100 mg at 08/22/24 0802   docusate sodium  (COLACE) capsule 100 mg, 100 mg, Oral, BID PRN, Autry, Lauren E, PA-C, 100 mg at 08/17/24 2118   feeding supplement (ENSURE PLUS HIGH PROTEIN) liquid 237 mL, 237 mL, Oral, BID BM, Danford, Lonni SQUIBB, MD, 237 mL at 08/22/24 1314   folic acid  (FOLVITE ) tablet 1 mg, 1 mg, Oral, Daily, Ogbata, Sylvester I, MD, 1 mg at 08/22/24 0803   gabapentin  (NEURONTIN ) capsule 200 mg, 200 mg, Oral, TID, Danford, Lonni SQUIBB, MD, 200 mg at 08/22/24 1611   heparin  injection 5,000 Units, 5,000 Units, Subcutaneous, Q8H, Desai, Rahul P, PA-C, 5,000 Units at 08/22/24 1313   labetalol  (NORMODYNE ) injection 10-40 mg, 10-40 mg, Intravenous, Q10 min PRN, Janjua, Rashid M, MD   LORazepam  (ATIVAN ) injection 0.5 mg, 0.5 mg, Intravenous, Q6H PRN, Krishnan, Gokul, MD   losartan  (COZAAR )  tablet 50 mg, 50 mg, Oral, Daily, Ogbata, Sylvester I, MD, 50 mg at 08/22/24 0802   lurasidone  (LATUDA ) tablet 20 mg, 20 mg, Oral, Q supper, Omar Orrego, MD, 20 mg at 08/22/24 1611   melatonin tablet 10 mg, 10 mg, Oral, QHS PRN, Kenard Zachary PARAS, MD, 10 mg at 08/21/24 2046   multivitamin with minerals tablet 1 tablet, 1 tablet, Oral, Daily, Rosario Eland I, MD, 1 tablet at 08/22/24 0803   nicotine  (NICODERM CQ  - dosed in mg/24 hours) patch  14 mg, 14 mg, Transdermal, Daily, Fredia Dorothe HERO, MD, 14 mg at 08/22/24 9195   ondansetron  (ZOFRAN ) tablet 4 mg, 4 mg, Oral, Q4H PRN **OR** ondansetron  (ZOFRAN ) injection 4 mg, 4 mg, Intravenous, Q4H PRN, Janjua, Rashid M, MD, 4 mg at 08/01/24 1506   Oral care mouth rinse, 15 mL, Mouth Rinse, PRN, Mannam, Praveen, MD   pantoprazole (PROTONIX) EC tablet 40 mg, 40 mg, Oral, QHS, Hongalgi, Anand D, MD, 40 mg at 08/21/24 2046   polyethylene glycol (MIRALAX / GLYCOLAX) packet 17 g, 17 g, Oral, Daily, Desai, Rahul P, PA-C, 17 g at 08/22/24 0802   promethazine (PHENERGAN) tablet 12.5-25 mg, 12.5-25 mg, Oral, Q4H PRN, Janjua, Rashid M, MD   propranolol (INDERAL) tablet 10 mg, 10 mg, Oral, TID, Janea Schwenn, MD   senna (SENOKOT) tablet 17.2 mg, 2 tablet, Oral, Daily, Desai, Rahul P, PA-C, 17.2 mg at 08/22/24 0802   sodium chloride  flush (NS) 0.9 % injection 10-40 mL, 10-40 mL, Intracatheter, Q12H, Franky Redia SAILOR, MD, 10 mL at 08/22/24 0804   sodium chloride  flush (NS) 0.9 % injection 10-40 mL, 10-40 mL, Intracatheter, PRN, Franky Redia SAILOR, MD, 30 mL at 07/29/24 1135   thiamine  (VITAMIN B1) tablet 100 mg, 100 mg, Oral, Daily, Hongalgi, Anand D, MD, 100 mg at 08/22/24 0803   vancomycin (VANCOCIN) IVPB 1000 mg/200 mL premix, 1,000 mg, Intravenous, Q12H, Clair Lynwood CROME, RPH, Last Rate: 200 mL/hr at 08/22/24 0944, 1,000 mg at 08/22/24 0944  Allergies: No Known Allergies  Justino Cornish, MD

## 2024-08-22 NOTE — Progress Notes (Signed)
 PROGRESS NOTE   Ernest Romero  FMW:[id number]    DOB: 01-02-1966    DOA: 07/15/2024  PCP: [name]   I have briefly reviewed patients previous medical records in [facility].   Brief Hospital Course:  PCCM Xfer 53/17. 58 year old male, homeless, medical history significant for alcohol use disorder, bipolar and panic disorder, initially presented to [facility] from jail in September 2025 with a large subdural hematoma.  Underwent craniotomy with flap replacement on 10/27.  Since then multiple OR visits including 10/30 for SDH evacuation, follow-up CT head 11/8 showed fluid collection underneath the bone flap, back to OR on 11/10 and the bone flap was removed, cultures were obtained, intraoperative cultures grew MRSA, treating for infected bone flap, ID on board and currently on IV vancomycin . Refer to PCCM progress note from 11/11 for detailed tabulation of hospital events until then.   Assessment & Plan:   Subdural hematoma  S/P initial craniotomy 9/28 (Dr. Loa poor barr)  S/P skull flap left cranioplasty 10/27 (Dr. Rosslyn).  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 Patient being followed periodically by neurosurgery, Dr. Rosslyn. - ID following, recommend PICC line (to be placed closer to time of discharge due to risk of patient pulling it out), OPAT orders placed by ID on 11/17, IV antibiotic end date 09/23/2024 (after IV antibiotics, ID recommends continuing treatment with doxycycline  to finish 8 weeks course), ID follow-up with Dr.Vu in clinic on 12/18 at 10 AM and SNF placement for same.   As per review with pharmacy, completed 1 week Keppra  postop. Therapies recommend SNF.  ICM on board for SNF placement.  As per ICM note from 11/14, pursuing LTAC, the discussed with [facility] and they were supposed to review and follow-up. Neurological status is stable.   Acute metabolic encephalopathy Per prior review of Dr. Rosario, [facility] MD  note from 11/10, patient unable to make decisions about medical treatment due to impaired capacity, secondary to neurosurgical issues noted above and multifactorial. At baseline, patient reportedly without cognitive impairment, was homeless and independent caring for self. Patient's support counselor Larnell [name] has been visiting intermittently. [phone]).  Per information available to medical team, he does not have kids, his spouse passed away. Has sister, estranged.  Also per Dr. Donn note from 7/10, given his current deficits in planning/executive function/memory, patient is likely to need guardianship for decisions i.e. placement. Patient's mentation has gradually improved. He is still requiring Haldol  every so often.  This morning he is complaining of facial tremors.  Could have tardive dyskinesia.  Will discontinue Haldol .  He is already on clonazepam .  Can use Ativan  as needed for anxiety.  He is on Latuda  and the dose of which was increased yesterday.  Proceed with EEG. EKG does not show QT prolongation.  Bipolar disorder Patient with longstanding history of bipolar disorder.  Was on lithium  previously but has not been on it since 10/27.  Experiencing some facial tremors with Latuda  and Haldol .  Haldol  discontinued.  Will request psychiatry to assist with medication management.  Use benzodiazepine for his tremors.   History of alcohol, tobacco use disorder - thiamine , folic acid , multivitamin  - Continue PTA Klonopin , dose reduced on 11/19. Treated for alcohol withdrawal delirium with phenobarbital  taper at [facility].  Alcohol withdrawal resolved.   Essential HTN Blood pressure is reasonably well-controlled.  Patient noted to be on amlodipine  and losartan .  Constipation Continue bowel regimen.  Nutrition Status: Nutrition Problem: Severe Malnutrition  Etiology: acute illness Signs/Symptoms: energy intake < or equal to 50% for > or equal to 5 days, severe muscle depletion,  moderate fat depletion Interventions: Tube feeding Agree with dietitian evaluation and management.  Body mass index is 19.71 kg/m.   DVT prophylaxis: Subcutaneous heparin   Code Status: Full Code:  Family Communication: None at bedside. Disposition: LTAC being pursued     Consultants:   PCCM Neurology Neurosurgery Psychiatry Infectious disease  Procedures:   As noted above  Subjective:  Patient awake this morning.  Sitting up in the chair.  Complaining of tremors involving his mouth and his lower face.    Objective:   Vitals:   08/21/24 1942 08/22/24 0342 08/22/24 0500 08/22/24 0735  BP: 108/84 120/78  (!) 129/97  Pulse: 67 64    Resp: (!) 24 (!) 21  18  Temp:  97.6 F (36.4 C)  97.9 F (36.6 C)  TempSrc:  Axillary  Oral  SpO2: 96% 96%  97%  Weight:   62.3 kg   Height:        General appearance: Awake alert.  In no distress Resp: Clear to auscultation bilaterally.  Normal effort Cardio: S1-S2 is normal regular.  No S3-S4.  No rubs murmurs or bruit GI: Abdomen is soft.  Nontender nondistended.  Bowel sounds are present normal.  No masses organomegaly Extremities: No edema.   Facial tremors noted.   Data Reviewed:     CBC: Recent Labs  Lab 08/18/24 0610  WBC 5.2  HGB 11.8*  HCT 34.7*  MCV 88.3  PLT 351    Basic Metabolic Panel: Recent Labs  Lab 08/18/24 0610 08/21/24 0351  NA 139 139  K 4.0 4.5  CL 105 105  CO2 25 22  GLUCOSE 114* 98  BUN 20 21*  CREATININE 0.82 0.94  CALCIUM 9.0 9.5     Microbiology Studies:   Recent Results (from the past 240 hours)  Aerobic/Anaerobic Culture w Gram Stain (surgical/deep wound)     Status: None   Collection Time: 08/12/24  9:54 AM   Specimen: Wound; Tissue  Result Value Ref Range Status   Specimen Description WOUND  Final   Special Requests LEFT  Final   Gram Stain   Final    RARE WBC PRESENT, PREDOMINANTLY PMN NO ORGANISMS SEEN    Culture   Final    ABUNDANT METHICILLIN RESISTANT  STAPHYLOCOCCUS AUREUS NO ANAEROBES ISOLATED Performed at [facility], [address] [place], [place] [address]    Report Status 08/17/2024 FINAL  Final   Organism ID, Bacteria METHICILLIN RESISTANT STAPHYLOCOCCUS AUREUS  Final      Susceptibility   Methicillin resistant staphylococcus aureus - MIC*    CIPROFLOXACIN >=8 RESISTANT Resistant     ERYTHROMYCIN >=8 RESISTANT Resistant     GENTAMICIN <=0.5 SENSITIVE Sensitive     OXACILLIN >=4 RESISTANT Resistant     TETRACYCLINE <=1 SENSITIVE Sensitive     VANCOMYCIN 1 SENSITIVE Sensitive     TRIMETH /SULFA  80 RESISTANT Resistant     CLINDAMYCIN  <=0.25 SENSITIVE Sensitive     RIFAMPIN <=0.5 SENSITIVE Sensitive     Inducible Clindamycin  NEGATIVE Sensitive     LINEZOLID 2 SENSITIVE Sensitive     * ABUNDANT METHICILLIN RESISTANT STAPHYLOCOCCUS AUREUS  Aerobic/Anaerobic Culture w Gram Stain (surgical/deep wound)     Status: None   Collection Time: 08/12/24  9:55 AM   Specimen: Wound; Tissue  Result Value Ref Range Status   Specimen Description WOUND  Final  Special Requests LEFT  Final   Gram Stain NO WBC SEEN NO ORGANISMS SEEN   Final   Culture   Final    MODERATE METHICILLIN RESISTANT STAPHYLOCOCCUS AUREUS NO ANAEROBES ISOLATED Performed at [facility], [address] [place], [place] [address]    Report Status 08/17/2024 FINAL  Final   Organism ID, Bacteria METHICILLIN RESISTANT STAPHYLOCOCCUS AUREUS  Final      Susceptibility   Methicillin resistant staphylococcus aureus - MIC*    CIPROFLOXACIN >=8 RESISTANT Resistant     ERYTHROMYCIN >=8 RESISTANT Resistant     GENTAMICIN <=0.5 SENSITIVE Sensitive     OXACILLIN >=4 RESISTANT Resistant     TETRACYCLINE <=1 SENSITIVE Sensitive     VANCOMYCIN 1 SENSITIVE Sensitive     TRIMETH /SULFA  80 RESISTANT Resistant     CLINDAMYCIN  <=0.25 SENSITIVE Sensitive     RIFAMPIN <=0.5 SENSITIVE Sensitive     Inducible Clindamycin  NEGATIVE Sensitive     LINEZOLID 2 SENSITIVE  Sensitive     * MODERATE METHICILLIN RESISTANT STAPHYLOCOCCUS AUREUS    Radiology Studies:  No results found.    Scheduled Meds:    amLODipine   10 mg Oral Daily   Chlorhexidine Gluconate Cloth  6 each Topical Daily   clonazePAM   0.5 mg Oral BID   docusate  100 mg Oral Daily   feeding supplement  237 mL Oral BID BM   folic acid   1 mg Oral Daily   gabapentin  200 mg Oral TID   heparin injection (subcutaneous)  5,000 Units Subcutaneous Q8H   losartan  50 mg Oral Daily   lurasidone   80 mg Oral QPC supper   multivitamin with minerals  1 tablet Oral Daily   nicotine  14 mg Transdermal Daily   pantoprazole  40 mg Oral QHS   polyethylene glycol  17 g Oral Daily   senna  2 tablet Oral Daily   sodium chloride  flush  10-40 mL Intracatheter Q12H   thiamine   100 mg Oral Daily    Continuous Infusions:    vancomycin 1,000 mg (08/21/24 2238)     LOS: 38 days     Joette Pebbles, MD,    To contact the attending provider between 7A-7P or the covering provider during after hours 7P-7A, please log into the web site [email or web address] and access using universal [place] password for that web site. If you do not have the password, please call the hospital operator.  08/22/2024, 9:16 AM

## 2024-08-22 NOTE — Progress Notes (Addendum)
 Occupational Therapy Treatment Patient Details Name: Ernest Romero MRN: [id number] DOB: Feb 05, 1966 Today's Date: 08/22/2024   History of present illness Pt is a 58 y.o. male presenting 10/14 from [facility] where he was admitted 9/28 for unresponsive episode in the jail. 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 On CIWA protocol. Transferred to [facility] for further management of craniotomy and flap. 10/27 s/p cranioplasty with bone flap replacement. 10/28 Change in status thought to be related to Klonopin  administration 10/28, repeat CTH shows significant increase in size of mixed attenuation subdural fluid collection underlying the L tempoparietal craniotomy site, now measuring approximately 15 mm in thickness, with associated mild mass effect and slight rightward midline shift. OR on 10/30 for SDH evacuation. [facility] 11/7 with fluid collected under bone flap. 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 PMH alcohol use disorder, psychiatric disorder, HTN   OT comments  Pt EOB upon entry, agreeable to OT session.  He requires cueing to don helmet, but able to verbalize why he needs it.  Oriented to time and situation. He is following commands with increased time, recalls when to turn around in hallway during dual cognitive task.  Needing contact guard for transfers, mobility and grooming at sink.  Limited by posterior lean and impaired balance but overall progressing well.  Cognitively, demonstrating decreased safety awareness and problem solving.  Will benefit from cognitive assessment next session. Updated goals to address progress. Will follow acutely.       If plan is discharge home, recommend the following:  Supervision due to cognitive status;Direct supervision/assist for medications management;Assistance with cooking/housework;Direct supervision/assist for financial management;Assist for transportation;Assistance  with feeding;A little help with walking and/or transfers;Help with stairs or ramp for entrance;A little help with bathing/dressing/bathroom   Equipment Recommendations  Other (comment) (defer)    Recommendations for Other Services      Precautions / Restrictions Precautions Precautions: Fall Recall of Precautions/Restrictions: Impaired Precaution/Restrictions Comments: SBP <160, helmet (pt to wear when OOB per RN) Restrictions Weight Bearing Restrictions Per Provider Order: No       Mobility Bed Mobility Overal bed mobility: Needs Assistance             General bed mobility comments: pt EOB upon entry, bed alarming    Transfers Overall transfer level: Needs assistance Equipment used: 1 person hand held assist Transfers: Sit to/from Stand Sit to Stand: Contact guard assist           General transfer comment: contact guard for safety, posterior bias     Balance Overall balance assessment: Needs assistance Sitting-balance support: Feet supported, Bilateral upper extremity supported Sitting balance-Leahy Scale: Poor Sitting balance - Comments: posterior bias requiring cueing and able to self correct but poor carryvoer/ ability to sustain position Postural control: Posterior lean Standing balance support: Single extremity supported, During functional activity Standing balance-Leahy Scale: Poor Standing balance comment: posterior bias and needing cueing to correct                           ADL either performed or assessed with clinical judgement   ADL Overall ADL's : Needs assistance/impaired     Grooming: Contact guard assist;Wash/dry hands;Standing                   Toilet Transfer: Contact guard Marine Scientist Details (indicate cue type and reason): hand held assist on  IV pole, contact guard for safety due to posterior bias         Functional mobility during ADLs: Contact guard assist General ADL Comments: posterior  lean at EOB, and with standing/walking    Extremity/Trunk Assessment              Vision   Additional Comments: pt scanning around room, able to locate room number in hallway   Perception     Praxis     Communication Communication Communication: Impaired Factors Affecting Communication: Difficulty expressing self (soft spoken)   Cognition Arousal: Alert Behavior During Therapy: Flat affect Cognition: Cognition impaired     Awareness: Online awareness impaired Memory impairment (select all impairments): Working memory Attention impairment (select first level of impairment): Sustained attention Executive functioning impairment (select all impairments): Organization, Sequencing, Reasoning, Problem solving OT - Cognition Comments: patient oriented, able to state month, year and situation.  He follows commands, recalls room number and demonstrates ability to recall when to turn around during dual cognition task in hallway. continues to be limited by decreased awareness to deficits and safety.  pt would benefit from actual cognitive assessment.               [place] [employer] Scales of Cognitive Functioning: Confused, Appropriate: Moderate Assistance [VI] Following commands: Impaired Following commands impaired: Follows one step commands with increased time, Follows multi-step commands inconsistently      Cueing   Cueing Techniques: Verbal cues  Exercises      Shoulder Instructions       General Comments pt reports IV discomfort, redness noted- RN pausing IV due to infiltrate    Pertinent Vitals/ Pain       Pain Assessment Pain Assessment: No/denies pain Pain Intervention(s): Monitored during session  Home Living                                          Prior Functioning/Environment              Frequency  Min 2X/week        Progress Toward Goals  OT Goals(current goals can now be found in the care plan section)  Progress  towards OT goals: Progressing toward goals  Acute Rehab OT Goals Patient Stated Goal: get out of here OT Goal Formulation: With patient Time For Goal Achievement: 08/28/24 Potential to Achieve Goals: Fair  Plan      Co-evaluation                 AM-PAC OT 6 Clicks Daily Activity     Outcome Measure   Help from another person eating meals?: A Little Help from another person taking care of personal grooming?: A Little Help from another person toileting, which includes using toliet, bedpan, or urinal?: A Little Help from another person bathing (including washing, rinsing, drying)?: A Lot Help from another person to put on and taking off regular upper body clothing?: A Little Help from another person to put on and taking off regular lower body clothing?: A Lot 6 Click Score: 16    End of Session Equipment Utilized During Treatment: Gait belt;Other (comment) (helmet)  OT Visit Diagnosis: Other abnormalities of gait and mobility (R26.89);Muscle weakness (generalized) (M62.81);Other symptoms and signs involving the nervous system (R29.898);Cognitive communication deficit (R41.841);Other symptoms and signs involving cognitive function   Activity Tolerance Patient tolerated treatment well   Patient Left  in bed;with call bell/phone within reach;with bed alarm set   Nurse Communication Mobility status;Precautions;Other (comment) (IV)        Time: 9044-8984 OT Time Calculation (min): 20 min  Charges: OT General Charges $OT Visit: 1 Visit OT Treatments $Self Care/Home Management : 8-22 mins  Etta NOVAK, OT Acute Rehabilitation Services Office [phone] Secure Chat Preferred    Etta GORMAN Hope 08/22/2024, 11:28 AM

## 2024-08-23 ENCOUNTER — Inpatient Hospital Stay (HOSPITAL_COMMUNITY): Payer: MEDICAID

## 2024-08-23 LAB — COMPREHENSIVE METABOLIC PANEL WITH GFR
ALT: 38 U/L (ref 0–44)
AST: 23 U/L (ref 15–41)
Albumin: 3.2 g/dL — ABNORMAL LOW (ref 3.5–5.0)
Alkaline Phosphatase: 73 U/L (ref 38–126)
Anion gap: 9 (ref 5–15)
BUN: 22 mg/dL — ABNORMAL HIGH (ref 6–20)
CO2: 27 mmol/L (ref 22–32)
Calcium: 9.4 mg/dL (ref 8.9–10.3)
Chloride: 101 mmol/L (ref 98–111)
Creatinine, Ser: 0.83 mg/dL (ref 0.61–1.24)
GFR, Estimated: >60 mL/min (ref >60)
Glucose, Bld: 98 mg/dL (ref 70–99)
Potassium: 4 mmol/L (ref 3.5–5.1)
Sodium: 137 mmol/L (ref 135–145)
Total Bilirubin: 0.6 mg/dL (ref 0.0–1.2)
Total Protein: 6.5 g/dL (ref 6.5–8.1)

## 2024-08-23 LAB — CBC
HCT: 38.1 % — ABNORMAL LOW (ref 39.0–52.0)
Hemoglobin: 12.4 g/dL — ABNORMAL LOW (ref 13.0–17.0)
MCH: 29.2 pg (ref 26.0–34.0)
MCHC: 32.5 g/dL (ref 30.0–36.0)
MCV: 89.9 fL (ref 80.0–100.0)
Platelets: 334 K/uL (ref 150–400)
RBC: 4.24 MIL/uL (ref 4.22–5.81)
RDW: 12.7 % (ref 11.5–15.5)
WBC: 7.6 K/uL (ref 4.0–10.5)
nRBC: 0 % (ref 0.0–0.2)

## 2024-08-23 LAB — MAGNESIUM: Magnesium: 2.2 mg/dL (ref 1.7–2.4)

## 2024-08-23 MED ORDER — PROPRANOLOL HCL 10 MG PO TABS
10.0000 mg | ORAL_TABLET | Freq: Once | ORAL | Status: AC
  Administered 2024-08-23: 10 mg via ORAL
  Filled 2024-08-23: qty 1

## 2024-08-23 MED ORDER — PROPRANOLOL HCL 40 MG PO TABS
20.0000 mg | ORAL_TABLET | Freq: Three times a day (TID) | ORAL | Status: DC
  Administered 2024-08-23 – 2024-08-25 (×8): 20 mg via ORAL
  Filled 2024-08-23 (×8): qty 1

## 2024-08-23 NOTE — TOC Progression Note (Signed)
 Transition of Care [facility]) - Progression Note    Patient Details  Name: Ernest Romero MRN: [id number] Date of Birth: Mar 31, 1966  Transition of Care [facility]) CM/SW Contact  Montie LOISE Louder, [place] Phone Number: 08/23/2024, 11:24 AM  Clinical Narrative:     CSW met with patient - CSW introduced self and explained role. Patient states  his friend has secured him an apartment and he inquired when will he be able to go home. CSW explained once he is deemed stable by MD and disposition details has coordinated w/ APS and his peer support specialist. He states understanding. CSW explained he has no bed offers - he inquired if he could go to [facility]- CSW advised will follow up once support at home has been confirmed.   Patient reports he receives SSI each month- no SNAP benefits  CSW spoke with his friend Larnell, Water Engineer ( he has been w/pt for 5 years).  He reports he has secured an apartment for patient and someone will be with the patient 7 days per week once he discharges. He states he gave details to SW. CSW informed, maybe he was communicating with APS SW??? But it was not CSW ( hospital SW).  Spoke with APS SW Fernando, she states she has not talked with Joe but maybe her supervisor has been updated. She will update CSW once she finds out more.   CSW called [facility] CM Kyra- left voice message to return call.   TOC will continue to follow and assist with discharge planning.  Montie Louder, MSW, LCSW Clinical Social Worker       Expected Discharge Plan: Skilled Nursing Facility Barriers to Discharge: Continued Medical Work up               Expected Discharge Plan and Services   Discharge Planning Services: CM Consult                                           Social Drivers of Health (SDOH) Interventions SDOH Screenings   Food Insecurity: Patient Unable To Answer (07/16/2024)  Housing: Unknown (07/16/2024)  Transportation  Needs: Patient Unable To Answer (07/16/2024)  Utilities: Patient Unable To Answer (07/16/2024)  Alcohol Screen: Low Risk  (08/30/2022)  Depression (PHQ2-9): Medium Risk (08/30/2022)  Tobacco Use: Medium Risk (08/12/2024)    Readmission Risk Interventions    07/16/2024   12:23 PM  Readmission Risk Prevention Plan  Transportation Screening Complete  Medication Review (RN Care Manager) Complete  PCP or Specialist appointment within 3-5 days of discharge Complete  HRI or Home Care Consult Complete  SW Recovery Care/Counseling Consult Complete  Palliative Care Screening Not Applicable  Skilled Nursing Facility Not Applicable

## 2024-08-23 NOTE — Progress Notes (Signed)
 Physical Therapy Treatment Patient Details Name: Ernest Romero MRN: [id number] DOB: 1965/10/06 Today's Date: 08/23/2024   History of Present Illness Pt is a 58 y.o. male presenting 10/14 from [facility] where he was admitted 9/28 for [facility] episode in the jail. 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 On CIWA protocol. Transferred to [facility] for further management of craniotomy and flap. 10/27 s/p cranioplasty with bone flap replacement. 10/28 Change in status thought to be related to Klonopin  administration 10/28, repeat CTH shows significant increase in size of mixed attenuation subdural fluid collection underlying the L tempoparietal craniotomy site, now measuring approximately 15 mm in thickness, with associated mild mass effect and slight rightward midline shift. OR on 10/30 for SDH evacuation. [facility] 11/7 with fluid collected under bone flap. 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 PMH alcohol use disorder, psychiatric disorder, HTN    PT Comments  Pt resting upon arrival to room, states he has been sleeping most of the day due to not sleeping overnight. Pt agreeable to OOB mobility, focus of safe gait use of RW today. Pt requiring less physical assist but mod cuing for safe RW use, occasional steadying assist only. Pt able to wayfind back to room without PT assist this date. PT to cotninue to follow.      If plan is discharge home, recommend the following: A lot of help with bathing/dressing/bathroom;Assistance with cooking/housework;Direct supervision/assist for medications management;Direct supervision/assist for financial management;Assist for transportation;Help with stairs or ramp for entrance;Supervision due to cognitive status;A little help with walking and/or transfers   Can travel by private vehicle        Equipment Recommendations  Wheelchair (measurements PT);Wheelchair cushion  (measurements PT);Rolling walker (2 wheels)    Recommendations for Other Services       Precautions / Restrictions Precautions Precautions: Fall Recall of Precautions/Restrictions: Impaired Precaution/Restrictions Comments: SBP <160, helmet (pt to wear when OOB per RN) Restrictions Weight Bearing Restrictions Per Provider Order: No     Mobility  Bed Mobility Overal bed mobility: Needs Assistance Bed Mobility: Supine to Sit     Supine to sit: Min assist     General bed mobility comments: assist for trunk rise and LE progression to EOB.    Transfers Overall transfer level: Needs assistance Equipment used: 1 person hand held assist Transfers: Sit to/from Stand Sit to Stand: Contact guard assist           General transfer comment: contact guard for safety, posterior bias    Ambulation/Gait Ambulation/Gait assistance: Min assist, Contact guard assist Gait Distance (Feet): 350 Feet Assistive device: Rolling walker (2 wheels) Gait Pattern/deviations: Step-through pattern, Decreased stride length, Narrow base of support, Trunk flexed, Drifts right/left, Leaning posteriorly Gait velocity: decr     General Gait Details: posterior bias, cues for leaning trunk forward   Stairs             Wheelchair Mobility     Tilt Bed    Modified Rankin (Stroke Patients Only)       Balance Overall balance assessment: Needs assistance Sitting-balance support: Feet supported, Bilateral upper extremity supported Sitting balance-Leahy Scale: Fair     Standing balance support: Bilateral upper extremity supported, During functional activity, No upper extremity supported Standing balance-Leahy Scale: Poor Standing balance comment: posterior bias  Communication Communication Communication: Impaired Factors Affecting Communication: Difficulty expressing self (soft spoken)  Cognition Arousal: Alert Behavior During Therapy: Flat  affect   PT - Cognitive impairments: Awareness, Memory, Attention, Problem solving, Safety/Judgement, Initiation                   Rancho Levels of Cognitive Functioning Rancho Los Amigos Scales of Cognitive Functioning: Automatic, Appropriate: Minimal Assistance for Daily Living Skills Rancho Los Amigos Scales of Cognitive Functioning: Automatic, Appropriate: Minimal Assistance for Daily Living Skills [VII] PT - Cognition Comments: Pt with improving awareness at times, stating I need my helmet once EOB and way finds back to room without PT assist this date. Following commands: Impaired Following commands impaired: Follows one step commands with increased time, Follows multi-step commands inconsistently    Cueing Cueing Techniques: Verbal cues  Exercises      General Comments        Pertinent Vitals/Pain Pain Assessment Pain Assessment: No/denies pain Pain Intervention(s): Monitored during session    Home Living                          Prior Function            PT Goals (current goals can now be found in the care plan section) Acute Rehab PT Goals Patient Stated Goal: none stated PT Goal Formulation: With patient Time For Goal Achievement: 08/19/24 Potential to Achieve Goals: Fair Progress towards PT goals: Progressing toward goals    Frequency    Min 2X/week      PT Plan      Co-evaluation              AM-PAC PT 6 Clicks Mobility   Outcome Measure  Help needed turning from your back to your side while in a flat bed without using bedrails?: A Little Help needed moving from lying on your back to sitting on the side of a flat bed without using bedrails?: A Little Help needed moving to and from a bed to a chair (including a wheelchair)?: A Little Help needed standing up from a chair using your arms (e.g., wheelchair or bedside chair)?: A Little Help needed to walk in hospital room?: A Little Help needed climbing 3-5 steps with a  railing? : A Lot 6 Click Score: 17    End of Session Equipment Utilized During Treatment: Gait belt (helmet) Activity Tolerance: Patient limited by fatigue Patient left: with call bell/phone within reach;in bed;with bed alarm set Nurse Communication: Mobility status PT Visit Diagnosis: Other symptoms and signs involving the nervous system (R29.898);Muscle weakness (generalized) (M62.81)     Time: 1531-1550 PT Time Calculation (min) (ACUTE ONLY): 19 min  Charges:    $Gait Training: 8-22 mins PT General Charges $$ ACUTE PT VISIT: 1 Visit                     Ernest Romero, PT DPT Acute Rehabilitation Services Secure Chat Preferred  Office [phone]    Ernest Romero 08/23/2024, 4:51 PM

## 2024-08-23 NOTE — Progress Notes (Signed)
 Occupational Therapy Treatment Patient Details Name: Ernest Romero MRN: [id number] DOB: [birth year] Today's Date: 08/23/2024   History of present illness Pt is a 58 y.o. male presenting 10/14 from [facility] where he was admitted 9/28 for unresponsive episode in the jail. 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 On CIWA protocol. Transferred to [facility] for further management of craniotomy and flap. 10/27 s/p cranioplasty with bone flap replacement. 10/28 Change in status thought to be related to Klonopin  administration 10/28, repeat CTH shows significant increase in size of mixed attenuation subdural fluid collection underlying the L tempoparietal craniotomy site, now measuring approximately 15 mm in thickness, with associated mild mass effect and slight rightward midline shift. OR on 10/30 for SDH evacuation. [facility] 11/7 with fluid collected under bone flap. 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 PMH alcohol use disorder, psychiatric disorder, HTN   OT comments  Pt in recliner and agreeable to OT session.  Completed mini cog, scoring 4/5; modified short blessed test with deficits with recall, attention and sequencing.   Able to recall 2 step way finding task in hallway but needing significant cues for problem solving and locating his room once task completed.  Completing transfers and mobility with contact guard assist, attempted use of RW to increase forward weight shift but unsuccessful.  Grooming standing at sink with contact guard assist. He demonstrates decreased awareness of deficits and safety, at this time will need 24/7 supervision for safety at dc.  Continue to recommend <3hrs/day inpatient setting at dc.       If plan is discharge home, recommend the following:  Supervision due to cognitive status;Direct supervision/assist for medications management;Assistance with cooking/housework;Direct  supervision/assist for financial management;Assist for transportation;Assistance with feeding;A little help with walking and/or transfers;Help with stairs or ramp for entrance;A little help with bathing/dressing/bathroom   Equipment Recommendations  BSC/3in1    Recommendations for Other Services      Precautions / Restrictions Precautions Precautions: Fall Recall of Precautions/Restrictions: Impaired Precaution/Restrictions Comments: SBP <160, helmet (pt to wear when OOB per RN) Restrictions Weight Bearing Restrictions Per Provider Order: No       Mobility Bed Mobility               General bed mobility comments: OOB in recliner upon entry    Transfers Overall transfer level: Needs assistance Equipment used: 1 person hand held assist Transfers: Sit to/from Stand Sit to Stand: Contact guard assist           General transfer comment: contact guard for safety, posterior bias     Balance Overall balance assessment: Needs assistance Sitting-balance support: Feet supported, Bilateral upper extremity supported Sitting balance-Leahy Scale: Fair     Standing balance support: Bilateral upper extremity supported, During functional activity, No upper extremity supported Standing balance-Leahy Scale: Poor Standing balance comment: posterior bias, L lateral lean during ADLs at sink                           ADL either performed or assessed with clinical judgement   ADL Overall ADL's : Needs assistance/impaired     Grooming: Contact guard assist;Wash/dry hands;Standing           Upper Body Dressing : Sitting;Minimal assistance       Toilet Transfer: Contact guard assist;Ambulation           Functional mobility during ADLs:  Contact guard assist General ADL Comments: attempted to use RW for increased forward bias, but pt continues to have posterior lean.  with 0 hand support leans L at sink but self correcting    Extremity/Trunk Assessment               Vision       Perception     Praxis     Communication Communication Communication: Impaired Factors Affecting Communication: Difficulty expressing self (soft spoken)   Cognition Arousal: Alert Behavior During Therapy: Flat affect Cognition: Cognition impaired     Awareness: Online awareness impaired Memory impairment (select all impairments): Working civil service fast streamer, Short-term memory Attention impairment (select first level of impairment): Divided attention Executive functioning impairment (select all impairments): Organization, Sequencing, Reasoning, Problem solving OT - Cognition Comments: pt completed mini cog with 1 error (scoring 4/5); short blessed test with difficulty recalling name/address on 3 attempts but overall demonstrating deficits on sequencing and recall.  He completes 2 step task in hallway with cueing  required for way finding and attention, problem solving.  Continue to recommend he has 24/7 support at dc.               Rancho [employer] Scales of Cognitive Functioning: Automatic, Appropriate: Minimal Assistance for Daily Living Skills [VII] Following commands: Impaired Following commands impaired: Follows one step commands with increased time, Follows multi-step commands inconsistently      Cueing   Cueing Techniques: Verbal cues  Exercises      Shoulder Instructions       General Comments      Pertinent Vitals/ Pain       Pain Assessment Pain Assessment: No/denies pain Pain Intervention(s): Monitored during session  Home Living                                          Prior Functioning/Environment              Frequency  Min 2X/week        Progress Toward Goals  OT Goals(current goals can now be found in the care plan section)  Progress towards OT goals: Progressing toward goals  Acute Rehab OT Goals Patient Stated Goal: rehab OT Goal Formulation: With patient Time For Goal Achievement:  08/28/24 Potential to Achieve Goals: Fair  Plan      Co-evaluation                 AM-PAC OT 6 Clicks Daily Activity     Outcome Measure   Help from another person eating meals?: A Little Help from another person taking care of personal grooming?: A Little Help from another person toileting, which includes using toliet, bedpan, or urinal?: A Little Help from another person bathing (including washing, rinsing, drying)?: A Little Help from another person to put on and taking off regular upper body clothing?: A Little Help from another person to put on and taking off regular lower body clothing?: A Little 6 Click Score: 18    End of Session Equipment Utilized During Treatment: Gait belt;Other (comment) (helmet)  OT Visit Diagnosis: Other abnormalities of gait and mobility (R26.89);Muscle weakness (generalized) (M62.81);Other symptoms and signs involving the nervous system (R29.898);Cognitive communication deficit (R41.841);Other symptoms and signs involving cognitive function   Activity Tolerance Patient tolerated treatment well   Patient Left with call bell/phone within reach;in chair;with chair alarm set   Nurse Communication Mobility  status;Precautions        Time: 8952-8890 OT Time Calculation (min): 22 min  Charges: OT General Charges $OT Visit: 1 Visit OT Treatments $Self Care/Home Management : 8-22 mins  Etta NOVAK, OT Acute Rehabilitation Services Office [phone] Secure Chat Preferred    Etta GORMAN Hope 08/23/2024, 11:19 AM

## 2024-08-23 NOTE — TOC Progression Note (Addendum)
 Transition of Care [facility]) - Progression Note    Patient Details  Name: Ernest Romero MRN: [id number] Date of Birth: Feb 01, 1966  Transition [facility] CM/SW Contact  Montie LOISE Louder, [place] Phone Number: 08/23/2024, 3:28 PM  Clinical Narrative:     3:02 pm CSW called patient's friend Joe for further clarification on housing-CSW inquired about new address for patient- Joe explained, no housing has been secured but he will have a place when he is discharged. He reports insurance is going to get placement  they have their own housing for patients like [place].  CSW explained the idea is to have a place identified before he is stable for d/c. He also mentions the patient could be home alone, CSW advised, it appears at this time that would not be safe- he will need support when and if he returns to the community. Joe states insurance will provide support, and he would be there.      3:10 pm CSW spoke with CM Kyra Igo w/ [facility]- she states they DO NOT have a placement for patient. However, they can assist with looking for placement. She states two facilities that may consider is [facility] and [facility] once patient is stable ( SNF will have to review and approve contract w/insurance ). CSW will send referrals next week for them to review for possible placement. CSW expressed concerns with boundaries w/Peer support specialist and the patient.   3:33 pm- CSW updated APS SW [place]- informed Joe told patient and RN,  he had secured housing but after further discussion NO housing has been secured.  CSW expressed concerns with boundaries w/Peer support specialist and the patient. CSW updated SW Keoda on conversation w/ CW for [employer]. Fernando reports she will visit the patient next week.  TOC will continue to follow and assist with d/c planning. TOC will send out more SNF referrals and continue to seek appropriate placement TOC will continue communication w/CM [place] @ [phone]  w/ [facility] and APS SW [place] @ [phone] & coordinate efforts for safe d/c plan.   Montie Louder, MSW, LCSW Clinical Social Worker    Expected Discharge Plan: Skilled Nursing Facility Barriers to Discharge: Continued Medical Work up               Expected Discharge Plan and Services   Discharge Planning Services: CM Consult                                           Social Drivers of Health (SDOH) Interventions SDOH Screenings   Food Insecurity: Patient Unable To Answer (07/16/2024)  Housing: Unknown (07/16/2024)  Transportation Needs: Patient Unable To Answer (07/16/2024)  Utilities: Patient Unable To Answer (07/16/2024)  Alcohol Screen: Low Risk  (08/30/2022)  Depression (PHQ2-9): Medium Risk (08/30/2022)  Tobacco Use: Medium Risk (08/12/2024)    Readmission Risk Interventions    07/16/2024   12:23 PM  Readmission Risk Prevention Plan  Transportation Screening Complete  Medication Review (RN Care Manager) Complete  PCP or Specialist appointment within 3-5 days of discharge Complete  HRI or Home Care Consult Complete  SW Recovery Care/Counseling Consult Complete  Palliative Care Screening Not Applicable  Skilled Nursing Facility Not Applicable

## 2024-08-23 NOTE — Procedures (Signed)
 Patient Name: Ernest Romero  MRN: [id number]  Epilepsy Attending: Pastor Falling  Referring Physician/Provider: Awanda City, MD      Date: 08/23/2024 Duration: 30 minutes   Patient history: 58 with subdural hematoma s/p craniotomy   Level of alertness: Awake, drowsy, sleep  AEDs during EEG study: Gabapentin     Technical aspects: This EEG study was done with scalp electrodes positioned according to the 10-20 International system of electrode placement. Electrical activity was reviewed with band pass filter of 1-70Hz , sensitivity of 7 uV/mm, display speed of 83mm/sec with a 60Hz  notched filter applied as appropriate. EEG data were recorded continuously and digitally stored.  Video monitoring was available and reviewed as appropriate.  Description: The posterior dominant rhythm consists of 8.5 Hz activity of moderate voltage (25-35 uV) seen predominantly in posterior head regions, symmetric and reactive to eye opening and eye closing. Drowsiness was characterized by attenuation of the posterior background rhythm. Stage 1 sleep was seen. There was presence of continuous left hemispheric slowing. There was additional high amplitude sharply contoured waves consistent with breach rhythm. Hyperventilation and photic stimulation were not performed.     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 There is also presence of breach rhythm in the left hemisphere consistent with the patient known history of craniotomy.  No seizures or epileptiform discharges were seen throughout the recording.    Pastor Falling MD Neurology

## 2024-08-23 NOTE — Progress Notes (Signed)
 PROGRESS NOTE   Ernest Romero  FMW:[id number]    DOB: 20-Jul-1966    DOA: 07/15/2024  PCP: [name]   I have briefly reviewed patients previous medical records in [facility].   Brief Hospital Course:  PCCM Xfer 82/82. 58 year old male, homeless, medical history significant for alcohol use disorder, bipolar and panic disorder, initially presented to [facility] from jail in September 2025 with a large subdural hematoma.  Underwent craniotomy with flap replacement on 10/27.  Since then multiple OR visits including 10/30 for SDH evacuation, follow-up CT head 11/8 showed fluid collection underneath the bone flap, back to OR on 11/10 and the bone flap was removed, cultures were obtained, intraoperative cultures grew MRSA, treating for infected bone flap, ID on board and currently on IV vancomycin . Refer to PCCM progress note from 11/11 for detailed tabulation of hospital events until then.   Assessment & Plan:   Subdural hematoma  S/P initial craniotomy 9/28 (Dr. Loa poor barr)  S/P skull flap left cranioplasty 10/27 (Dr. Rosslyn).  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 Patient being followed periodically by neurosurgery, Dr. Rosslyn. Patient was seen by infectious disease.  Patient remains on vancomycin .  End date for IV antibiotics is 09/23/2024.  Following IV antibiotics patient to be treated with doxycycline  for 8 weeks. PICC line to be placed closer to discharge. Patient remains stable from neurological standpoint. Patient has completed 1 week of Keppra . Still has sutures.  Surgery was on 11/10.  Dr. Janjua mentions that sutures should come out 3 weeks postoperatively.  So sutures should come out on December 1.   Therapies recommend SNF.  ICM on board for SNF placement.  As per ICM note from 11/14, pursuing LTAC, the discussed with [facility] and they were supposed to review and follow-up.   Acute metabolic encephalopathy Per prior  review of Dr. Rosario, [facility] MD note from 11/10, patient unable to make decisions about medical treatment due to impaired capacity, secondary to neurosurgical issues noted above and multifactorial. At baseline, patient reportedly without cognitive impairment, was homeless and independent caring for self. Patient's support counselor Larnell [name] has been visiting intermittently. [phone]).  Per information available to medical team, he does not have kids, his spouse passed away. Has sister, estranged.  Also per Dr. Donn note from 7/10, given his current deficits in planning/executive function/memory, patient is likely to need guardianship for decisions i.e. placement. Patient's mentation has gradually improved.  Bipolar disorder/akathisia Patient with longstanding history of bipolar disorder.  Was on lithium  previously but has not been on it since 10/27.   Patient was requiring Haldol .  Patient's Latuda  dose was increased. Patient was noted to have facial tremors 11/20.  Concern for tardive dyskinesia.  Psychiatry was consulted to assist with management.  Seen by psychiatry.  They have discontinued the Latuda . Patient mentions that his facial tremors have improved though not completely resolved.  Patient has also been initiated on propranolol  by psychiatry. EEG is pending. EKG does not show QT prolongation. Now off of antipsychotics due to akathisia/tardive dyskinesia. Currently only on clonazepam  0.5 mg twice a day.  This dose was decreased a few days ago.  And also on Ativan  as needed for his facial tremors.     History of alcohol, tobacco use disorder - thiamine , folic acid , multivitamin  - Continue PTA Klonopin , dose reduced on 11/19. Treated for alcohol withdrawal delirium with phenobarbital  taper at [facility].  Alcohol withdrawal resolved.   Essential  HTN Blood pressure is reasonably well-controlled.  Patient noted to be on amlodipine  and losartan .  Constipation Continue bowel  regimen.  Nutrition Status: Nutrition Problem: Severe Malnutrition Etiology: acute illness Signs/Symptoms: energy intake < or equal to 50% for > or equal to 5 days, severe muscle depletion, moderate fat depletion Interventions: Tube feeding Agree with dietitian evaluation and management.  Body mass index is 19.52 kg/m.   DVT prophylaxis: Subcutaneous heparin    Code Status: Full Code:  Family Communication: None at bedside. Disposition: LTAC being pursued     Consultants:   PCCM Neurology Neurosurgery Psychiatry Infectious disease  Procedures:   As noted above  Subjective:  Patient mentions that he is feeling better this morning.  Tremors have improved.  Wants to talk to social worker regarding plans for rehab.  Objective:   Vitals:   08/22/24 2306 08/23/24 0335 08/23/24 0500 08/23/24 0840  BP: 100/78 95/64  (!) 159/84  Pulse:  62  72  Resp:  16  18  Temp:  97.7 F (36.5 C)  97.6 F (36.4 C)  TempSrc:  Axillary  Oral  SpO2:  96%  97%  Weight:   61.7 kg   Height:        General appearance: Awake alert.  In no distress Resp: Clear to auscultation bilaterally.  Normal effort Cardio: S1-S2 is normal regular.  No S3-S4.  No rubs murmurs or bruit GI: Abdomen is soft.  Nontender nondistended.  Bowel sounds are present normal.  No masses organomegaly Extremities: No edema.  Full range of motion of lower extremities. Patient with tremors noted but decreased in intensity.  Data Reviewed:     CBC: Recent Labs  Lab 08/18/24 0610 08/23/24 0134  WBC 5.2 7.6  HGB 11.8* 12.4*  HCT 34.7* 38.1*  MCV 88.3 89.9  PLT 351 334    Basic Metabolic Panel: Recent Labs  Lab 08/18/24 0610 08/21/24 0351 08/23/24 0134  NA 139 139 137  K 4.0 4.5 4.0  CL 105 105 101  CO2 25 22 27   GLUCOSE 114* 98 98  BUN 20 21* 22*  CREATININE 0.82 0.94 0.83  CALCIUM 9.0 9.5 9.4  MG  --   --  2.2     Microbiology Studies:   No results found for this or any previous visit (from  the past 240 hours).   Radiology Studies:  No results found.    Scheduled Meds:    amLODipine   10 mg Oral Daily   Chlorhexidine  Gluconate Cloth  6 each Topical Daily   clonazePAM   0.5 mg Oral BID   docusate  100 mg Oral Daily   feeding supplement  237 mL Oral BID BM   folic acid   1 mg Oral Daily   gabapentin   200 mg Oral TID   heparin  injection (subcutaneous)  5,000 Units Subcutaneous Q8H   losartan   50 mg Oral Daily   multivitamin with minerals  1 tablet Oral Daily   nicotine   14 mg Transdermal Daily   pantoprazole   40 mg Oral QHS   polyethylene glycol  17 g Oral Daily   propranolol   10 mg Oral TID   senna  2 tablet Oral Daily   sodium chloride  flush  10-40 mL Intracatheter Q12H   thiamine   100 mg Oral Daily    Continuous Infusions:    vancomycin  1,000 mg (08/23/24 0907)     LOS: 39 days     Joette Pebbles, MD,    To contact the attending provider between 7A-7P  or the covering provider during after hours 7P-7A, please log into the web site [email or web address] and access using universal [place] password for that web site. If you do not have the password, please call the hospital operator.  08/23/2024, 9:53 AM

## 2024-08-23 NOTE — Consult Note (Signed)
 [facility] Psychiatric Consult Follow-up  Patient Name: .MOO GRAVLEY  MRN: [id number]  DOB: 1965-12-17  Consult Order details:  Orders (From admission, onward)     Start     Ordered   08/22/24 0842  IP CONSULT TO PSYCHIATRY       Ordering Provider: Verdene Purchase, MD  Provider:  (Not yet assigned)  Question Answer Comment  Location [facility]   Reason for Consult? patient with h/o bipolar d/o. was on lithium . here with subdural hematoma and MRSA. improving. some facial trmors. Need help with medication management. he was on lithium , lurasidone . requiring haldol  prn.  thanks      08/22/24 0842   08/02/24 1712  IP CONSULT TO PSYCHIATRY       Ordering Provider: Adolph Tinnie FORBES, PA-C  Provider:  (Not yet assigned)  Question Answer Comment  Location [facility]   Reason for Consult? bipolar      08/02/24 1711   07/24/24 1656  IP CONSULT TO PSYCHIATRY       Ordering Provider: Patsy Lenis, MD  Provider:  (Not yet assigned)  Question Answer Comment  Location [facility]   Reason for Consult? needing formal eval for capacity; may need guardianship given no family/support for discharge      07/24/24 1656   07/16/24 1412  IP CONSULT TO PSYCHIATRY       Ordering Provider: Dino Antu, MD  Provider:  (Not yet assigned)  Question Answer Comment  Location [facility]   Reason for Consult? Homicidal/suicidal comments, bipolar disorder      07/16/24 1412             Mode of Visit: In person    Psychiatry Consult Evaluation  Service Date: August 23, 2024 LOS:  LOS: 39 days  Chief Complaint severe anxiety, mouth tremors hand tremors, severe restlessness that start after admission and worsened since yesterday  Primary Psychiatric Diagnoses  Akithisia (versus intentionally-produced movement) 2.  MDD, recurrent, severe, without psychotic features 3.  Stimulant use disorder, in controlled  setting Rule out traumatic brain injury from chronic subdural hematomas and neurosurgical procedures and complications Rule out bipolar disorder but likely previous manic episodes were meth induced per chart review  Assessment  SHAHEEM PICHON is a 58 y.o. male admitted: Medicallyfor 07/15/2024 11:15 PM for recurrent subdural hematoma. He carries the psychiatric diagnoses of bipolar disorder and alcohol use disorder and has a past medical history of subdural hematoma status postcraniotomy.   Current presentation with worsening anxiety, restlessness, dyskinetic movements of face and hands is most likely consistent with akathisia as these experience started on admission when he was restarted on Latuda  and worsened since increasing Latuda  to 80 mg.  Plan to decrease Latuda  to 20 mg to decrease akathisia/EPS risk while also avoiding withdrawal syndrome from the Latuda ; after discussion of this plan with patient he wanted to discontinue immediately.  Patient received Ativan  that is already ordered as well as her propranolol  dose once at time of interview due to concern from akathisia.  We will assess his response to these medications and also trial dose of benztropine  IV 2 mg at 1500 once the response from the ativan /propranolol  already given has been observed.   After trialing Ativan  0.5 which was already in place by primary team PRN, patient had some response and improvement in his dyskinetic movements and perceived anxiety.  Patient was then trialed after some washout time on benztropine  2 mg IV  and did not have improvement in his symptoms and some side effects of disorientation after dose.  Failed trial to benztropine  decreases likelihood of drug-induced parkinsonism.  Given suspicion for akathisia we will also trial patient on propranolol  to assess if he has a response to this medication.  Patient is already on clonazepam  and reasonable to continue this given current akathisia but could consider  de-escalating in the future as patient has well-documented history of delirium in the hospital.   Furthermore, mirtazapine  is reasonable to start as patient is having significant depression, has evidence for panic disorder which patient reports history of, has evidence for akathisia (see reference below), helps with insomnia which he is experiencing, helps with appetite which is poor for him currently, and has lower risk of switching if patient does have true bipolar disorder.  On chart review patient does not have any documented manic like episodes that did not involve a positive amphetamine suggesting that patient is presenting with meth intoxication.  Patient's historical manic like episodes which occurred outside of substance use do not appear consistent with a true manic episode as he is only endorsing increased mood, decreased sleep, and no associated dysfunction or increased activity levels or other features of mania.  No family history of bipolar disorder.  Of note patient may also have some changes in his baseline mentation due to the recurrent subdural hematomas and the neurosurgical procedures and complications which in itself can be risk factors for traumatic brain injury sequelae which may be also affecting his functioning.  Furukawa Y, Imai K, Takahashi Y, Efthimiou O, Leucht S. Comparative Efficacy and Acceptability of Treatment Strategies for Antipsychotic-Induced Akathisia: A Systematic Review and Network Meta-analysis. Schizophr Bull. 2024 Jun 13:sbae098. doi: 10.1093/schbul/sbae098. Epub ahead of print. PMID: 61130822.  08/23/24 Continued akithesia. Having some improvement to propranolol  but not completely. Increased propranolol  to 20 and could be further increased. If BP issues, can cut back on other meds for BP. Some benefit from mirtazapine  as well. No manic switching. No other EPS including parkinsonsim on passive ROM. Movement appears to worsen during interivew voluntary so may be a  component of intentional/voluntary component from patient but either way necessary to treat as if akithisia as cannot miss.   Diagnoses:  Active Hospital problems: Principal Problem:   Subdural hematoma (HCC) Active Problems:   Bipolar disorder (HCC)   Essential hypertension   Alcohol withdrawal (HCC)   Unable to make decisions about medical treatment due to impaired mental capacity   Protein-calorie malnutrition, severe   Acute metabolic encephalopathy   Infection of craniotomy plate    Plan   ## Psychiatric Medication Recommendations:  Continue mirtazapine  15 mg once daily at bedtime for akathisia, insomnia, mood; monitor for mania switching but has lower risk of manic switch than other antidepressant if true bipolar Increase propranolol  from 10 to 20 mg 3 times daily for akathisia, anxiety, TBI; monitor response consider titrating as tolerated Reasonable to continue clonazepam  in the setting of current akathesia but could consider titrating off this medication in the future especially if having delirium  Consider pregabalin for TBI / anxiety in future  ## Medical Decision Making Capacity: Not specifically addressed in this encounter  ## Further Work-up:  -- TSH 1 month ago 0.775 While pt on Qtc prolonging medications, please monitor & replete K+ to 4 and Mg2+ to 2 -- most recent EKG on 11/19 had QtC of 414 -- Pertinent labwork reviewed earlier this admission includes: GFR greater than 60, LFT relatively within normal  limits mildly elevated ALT, hemoglobin 11-12   ## Disposition:-- need to treat akithesia prior to discharge given substant distress and higher risk for suicide attempt with akithesia  ## Behavioral / Environmental: -Delirium Precautions: Delirium Interventions for Nursing and Staff: - RN to open blinds every AM. - To Bedside: Glasses, hearing aide, and pt's own shoes. Make available to patients. when possible and encourage use. - Encourage po fluids when  appropriate, keep fluids within reach. - OOB to chair with meals. - Passive ROM exercises to all extremities with AM & PM care. - RN to assess orientation to person, time and place QAM and PRN. - Recommend extended visitation hours with familiar family/friends as feasible. - Staff to minimize disturbances at night. Turn off television when pt asleep or when not in use.    ## Safety and Observation Level:  - Based on my clinical evaluation, I estimate the patient to be at low risk of self harm in the current setting. - At this time, we recommend  routine. This decision is based on my review of the chart including patient's history and current presentation, interview of the patient, mental status examination, and consideration of suicide risk including evaluating suicidal ideation, plan, intent, suicidal or self-harm behaviors, risk factors, and protective factors. This judgment is based on our ability to directly address suicide risk, implement suicide prevention strategies, and develop a safety plan while the patient is in the clinical setting. Please contact our team if there is a concern that risk level has changed.  CSSR Risk Category:C-SSRS RISK CATEGORY: No Risk  Suicide Risk Assessment: Patient has following modifiable risk factors for suicide: untreated depression, which we are addressing by medication adjustments. Patient has following non-modifiable or demographic risk factors for suicide: Male and psychiatric hospitalization Patient has the following protective factors against suicide: n/a  Thank you for this consult request. Recommendations have been communicated to the primary team.  We will continue to follow at this time.   Justino Cornish, MD       History of Present Illness  Relevant Aspects of [facility] Course per hospitalist:  PCCM Xfer 37/31. 58 year old male, homeless, medical history significant for alcohol use disorder, bipolar and panic disorder, [facility]  [facility] jail [facility] September 2025 with a large subdural hematoma.  Underwent craniotomy with flap replacement on 10/27.  Since then multiple OR visits including 10/30 for [facility] evacuation, follow-up CT head 11/8 showed fluid collection underneath the bone flap, back to OR on 11/10 and the bone flap was removed, cultures were obtained, intraoperative cultures grew MRSA, treating for infected bone flap, ID on board and currently on IV vancomycin . Refer to PCCM progress note from 11/11 for detailed tabulation of hospital events until then.  Patient Report:  Rpeorts some benefits from the propranolol . Still having the hand and mouth movements. Slept well with mirtizapine. Denies racing thoughts, sleep issues, increase activity levels, changes in mood. Denies pain or stiffness in joints. No side effects to meds.   Psych ROS:  Depression: Reports he has been having depressed mood, anhedonia, worthlessness, at lowest in his life, but denies suicidal thoughts but does have morbid rumination Anxiety: Reports that he is an anxious person but that the anxiety has been much worse since being the hospital.  Says that he feels restless and is having issues sleeping because of it.  Reports that he is anxious about his situation where he will go from here. Mania (lifetime and current): Endorses having manic episodes  but when asking about these episodes he does not describe any increased goal-directed activity, denies any decreased need for sleep, but does endorse feeling better, talking more, and impulsivity.  Denies having any dysfunction during these times.  Denies having any distractibility, grandiosity, flight of ideas, risky behaviors. Psychosis: (lifetime and current): Denies AVH or paranoia. Sleep: Reports that he cannot sleep with the shaking and in general   Psychiatric and Social History  Psychiatric History:  Information collected from patient and chart review  Prev Dx/Sx: Bipolar disorder,  methamphetamine use, substance-induced mood disorder, alcohol use disorder Current Psych Provider: None Home Meds (current): Restarted on Latuda  40 and titrated to 80 this hospitalization, was not taking anything prior to admission in the short period that  He was away from hospital.  Also restarted on Klonopin  Previous Med Trials: Lithium , Klonopin , Latuda , Haldol  as needed for agitation Therapy: None  Prior Psych Hospitalization: Previous hospitalizations although the psychiatric hospitalizations are not documented but the emergency department visits prior to have been documented and include episodes documented as mania but are occurring in the presence of positive amphetamine Prior Self Harm: Denies Prior Violence: Denies  Family Psych History: Denies Family Hx suicide: Denies  Social History:  Reports that he has been experiencing homelessness.  Does not report any recent incarceration which appears to conflict with previous chart review.  Does report he has a upcoming court date on December 7.  Denies access to weapons.  Reports that his hobby is making rock music.  Reports that he has a friend who is his international aid/development worker. Access to weapons/lethal means: Denies  Substance History Denies using any substances recently.  Has been in controlled setting.  Does have history of using stimulants but he denies when asked during interview.  Exam Findings  Physical Exam:  Vital Signs:  Temp:  [97.5 F (36.4 C)-98.4 F (36.9 C)] 97.6 F (36.4 C) (11/21 0840) Pulse Rate:  [62-75] 72 (11/21 0840) Resp:  [16-18] 18 (11/21 0840) BP: (95-159)/(64-84) 159/84 (11/21 0840) SpO2:  [95 %-97 %] 97 % (11/21 0840) Weight:  [61.7 kg] 61.7 kg (11/21 0500) Blood pressure (!) 159/84, pulse 72, temperature 97.6 F (36.4 C), temperature source Oral, resp. rate 18, height 5' 10 (1.778 m), weight 61.7 kg, SpO2 97%. Body mass index is 19.52 kg/m.  Physical Exam Constitutional:      Comments: Chronic ill  appearing  Pulmonary:     Effort: Pulmonary effort is normal.  Neurological:     Comments: No pain or stiffness with passive ROM of bilateral upper extremities.       Mental Status Exam: General Appearance: Patient is present in his room sitting in bed unable to move himself very well, blinds are closed, laying in clean gown, neurosurgical scar on head,  Orientation: Oriented to person place but not time  Memory: Did not formally assess  Concentration: Did not formally assess but grossly intact  Recall: Grossly intact but did not formally assess  Attention grossly intact but not formally assessed  Eye Contact: Good  Speech: Fair, dysarthric  Language: Fair  Volume: Normal  Mood: Lowest point my life  Affect: Depressed, flat, anxious  Thought Process: Coherent linear  Thought Content: Logical.  Morbid ruminations.  Worried about current situation and future.  Suicidal Thoughts:  No  Homicidal Thoughts:  No  Judgement:  Fair  Insight:  Fair  Psychomotor Activity:  Increased, dyskinetic movements of mouth and hands.  With mouth movement is sliding jaw back and forth.  There is some voluntary inhibition but is overall involuntary when he is not overriding.   Akathisia:  Yes  Fund of Knowledge:  Fair      Assets:  Desire for Improvement  Cognition: Did not formally assess  ADL's: Impaired by patient's movement limitations but is improving  AIMS (if indicated):  0     Other History   These have been pulled in through the EMR, reviewed, and updated if appropriate.  Family History:  The patient's family history is not on file.  Medical History: Past Medical History:  Diagnosis Date   Anxiety    Bipolar 1 disorder (HCC)    Depression    Hepatitis    Hypertension    MRSA (methicillin resistant Staphylococcus aureus)    5/25 arm abscess and 11/25    Surgical History: Past Surgical History:  Procedure Laterality Date   BURR HOLE N/A 08/01/2024   Procedure: CREATION,  CRANIAL BURR HOLE WITH EVACUATION OF HEMATOMA;  Surgeon: Rosslyn Dino HERO, MD;  Location: [facility] OR;  Service: Neurosurgery;  Laterality: N/A;   BURR HOLE N/A 08/12/2024   Procedure: left bone flap removal;  Surgeon: Rosslyn Dino HERO, MD;  Location[facility];  Service: Neurosurgery;  Laterality: N/A;   CRANIOPLASTY, WITH BONE FLAP REPLACEMENT Left 07/29/2024   Procedure: CRANIOPLASTY, WITH BONE FLAP REPLACEMENT;  Surgeon: Rosslyn Dino HERO, MD;  Location: [facility] OR;  Service: Neurosurgery;  Laterality: Left;  LEFT CRANIOPLASTY WITH ARTIFICAL BONE FLAP REPLACEMENT   CRANIOTOMY Left 06/30/2024   Procedure: CRANIOTOMY HEMATOMA EVACUATION SUBDURAL;  Surgeon: Melonie Grass, MD;  Location: [facility] ORS;  Service: Neurosurgery;  Laterality: Left;     Medications:   Current Facility-Administered Medications:    acetaminophen  (TYLENOL ) tablet 650 mg, 650 mg, Oral, Q4H PRN, 650 mg at 08/20/24 1718 **OR** [PENDING] acetaminophen  (TYLENOL ) tablet 650 mg, 650 mg, Per Tube, Q4H PRN **OR** acetaminophen  (TYLENOL ) suppository 650 mg, 650 mg, Rectal, Q4H PRN **OR** [PENDING] acetaminophen  (TYLENOL ) suppository 650 mg, 650 mg, Rectal, Q4H PRN,    amLODipine  (NORVASC ) tablet 10 mg, 10 mg, Oral, Daily, Ogbata, Sylvester I, MD, 10 mg at 08/23/24 0847   Chlorhexidine  Gluconate Cloth 2 % PADS 6 each, 6 each, Topical, Daily, Franky Redia SAILOR, MD, 6 each at 08/23/24 0848   clonazePAM  (KLONOPIN ) tablet 0.5 mg, 0.5 mg, Oral, BID, Krishnan, Gokul, MD, 0.5 mg at 08/23/24 0848   docusate (COLACE) 50 MG/5ML liquid 100 mg, 100 mg, Oral, Daily, Hongalgi, Anand D, MD, 100 mg at 08/23/24 0847   docusate sodium  (COLACE) capsule 100 mg, 100 mg, Oral, BID PRN, Autry, Lauren E, PA-C, 100 mg at 08/17/24 2118   feeding supplement (ENSURE PLUS HIGH PROTEIN) liquid 237 mL, 237 mL, Oral, BID BM, Danford, Christopher P, MD, 237 mL at 08/22/24 1314   folic acid  (FOLVITE ) tablet 1 mg, 1 mg, Oral, Daily, Rosario Eland I, MD, 1 mg at 08/23/24  0847   gabapentin  (NEURONTIN ) capsule 200 mg, 200 mg, Oral, TID, Danford, Lonni SQUIBB, MD, 200 mg at 08/23/24 0847   heparin  injection 5,000 Units, 5,000 Units, Subcutaneous, Q8H, Desai, Rahul P, PA-C, 5,000 Units at 08/23/24 9491   labetalol  (NORMODYNE ) injection 10-40 mg, 10-40 mg, Intravenous, Q10 min PRN, Janjua, Rashid M, MD   LORazepam  (ATIVAN ) injection 0.5 mg, 0.5 mg, Intravenous, Q6H PRN, Krishnan, Gokul, MD, 0.5 mg at 08/22/24 2306   losartan  (COZAAR ) tablet 50 mg, 50 mg, Oral, Daily, Ogbata, Sylvester I, MD, 50 mg at 08/23/24 0847   melatonin tablet 10 mg,  10 mg, Oral, QHS PRN, Shalhoub, George J, MD, 10 mg at 08/22/24 2133   multivitamin with minerals tablet 1 tablet, 1 tablet, Oral, Daily, Rosario Eland I, MD, 1 tablet at 08/23/24 0847   nicotine  (NICODERM CQ  - dosed in mg/24 hours) patch 14 mg, 14 mg, Transdermal, Daily, Fredia Dorothe HERO, MD, 14 mg at 08/23/24 0846   ondansetron  (ZOFRAN ) tablet 4 mg, 4 mg, Oral, Q4H PRN **OR** ondansetron  (ZOFRAN ) injection 4 mg, 4 mg, Intravenous, Q4H PRN, Janjua, Rashid M, MD, 4 mg at 08/01/24 1506   Oral care mouth rinse, 15 mL, Mouth Rinse, PRN, Mannam, Praveen, MD   pantoprazole  (PROTONIX ) EC tablet 40 mg, 40 mg, Oral, QHS, Hongalgi, Anand D, MD, 40 mg at 08/22/24 2133   polyethylene glycol (MIRALAX  / GLYCOLAX ) packet 17 g, 17 g, Oral, Daily, Desai, Rahul P, PA-C, 17 g at 08/22/24 0802   promethazine  (PHENERGAN ) tablet 12.5-25 mg, 12.5-25 mg, Oral, Q4H PRN, Janjua, Rashid M, MD   propranolol  (INDERAL ) tablet 10 mg, 10 mg, Oral, Once, Cornelius Dines, MD   propranolol  (INDERAL ) tablet 20 mg, 20 mg, Oral, TID, Mozetta Murfin, MD   senna (SENOKOT) tablet 17.2 mg, 2 tablet, Oral, Daily, Desai, Rahul P, PA-C, 17.2 mg at 08/22/24 0802   sodium chloride  flush (NS) 0.9 % injection 10-40 mL, 10-40 mL, Intracatheter, Q12H, Franky Redia SAILOR, MD, 30 mL at 08/23/24 0849   sodium chloride  flush (NS) 0.9 % injection 10-40 mL, 10-40 mL, Intracatheter,  PRN, Franky Redia SAILOR, MD, 30 mL at 07/29/24 1135   thiamine  (VITAMIN B1) tablet 100 mg, 100 mg, Oral, Daily, Hongalgi, Anand D, MD, 100 mg at 08/23/24 0847   vancomycin  (VANCOCIN ) IVPB 1000 mg/200 mL premix, 1,000 mg, Intravenous, Q12H, Clair Lynwood CROME, RPH, Last Rate: 200 mL/hr at 08/23/24 0907, 1,000 mg at 08/23/24 9092  Allergies: No Known Allergies  Dines Cornelius, MD

## 2024-08-23 NOTE — Progress Notes (Signed)
 Routine EEG completed, results pending Neurology review and interpretation

## 2024-08-24 DIAGNOSIS — F1590 Other stimulant use, unspecified, uncomplicated: Secondary | ICD-10-CM

## 2024-08-24 DIAGNOSIS — G2571 Drug induced akathisia: Secondary | ICD-10-CM

## 2024-08-24 NOTE — Consult Note (Signed)
 [facility] Psychiatric Consult Follow-up  Patient Name: .Ernest Romero  MRN: [id number]  DOB: [birth year]  Consult Order details:  Orders (From admission, onward)     Start     Ordered   08/22/24 0842  IP CONSULT TO PSYCHIATRY       Ordering Provider: Verdene Purchase, MD  Provider:  (Not yet assigned)  Question Answer Comment  Location [facility]   Reason for Consult? patient with h/o bipolar d/o. was on lithium . here with subdural hematoma and MRSA. improving. some facial trmors. Need help with medication management. he was on lithium , lurasidone . requiring haldol  prn.  thanks      08/22/24 0842   08/02/24 1712  IP CONSULT TO PSYCHIATRY       Ordering Provider: Adolph Tinnie FORBES, PA-C  Provider:  (Not yet assigned)  Question Answer Comment  Location [facility]   Reason for Consult? bipolar      08/02/24 1711   07/24/24 1656  IP CONSULT TO PSYCHIATRY       Ordering Provider: Patsy Lenis, MD  Provider:  (Not yet assigned)  Question Answer Comment  Location [facility]   Reason for Consult? needing formal eval for capacity; may need guardianship given no family/support for discharge      07/24/24 1656   07/16/24 1412  IP CONSULT TO PSYCHIATRY       Ordering Provider: Dino Antu, MD  Provider:  (Not yet assigned)  Question Answer Comment  Location [facility]   Reason for Consult? Homicidal/suicidal comments, bipolar disorder      07/16/24 1412             Mode of Visit: In person    Psychiatry Consult Evaluation  Service Date: August 24, 2024 LOS:  LOS: 40 days  Chief Complaint severe anxiety, mouth tremors hand tremors, severe restlessness that start after admission and worsened since yesterday  Primary Psychiatric Diagnoses  Akithisia (versus intentionally-produced movement) 2.  MDD, recurrent, severe, without psychotic features 3.  Stimulant use disorder, in controlled  setting Rule out traumatic brain injury from chronic subdural hematomas and neurosurgical procedures and complications Rule out bipolar disorder but likely previous manic episodes were meth induced per chart review  Assessment  Ernest Romero is a 58 y.o. male admitted: Medicallyfor 07/15/2024 11:15 PM for recurrent subdural hematoma. He carries the psychiatric diagnoses of bipolar disorder and alcohol use disorder and has a past medical history of subdural hematoma status postcraniotomy.   Current presentation with worsening anxiety, restlessness, dyskinetic movements of face and hands is most likely consistent with akathisia as these experience started on admission when he was restarted on Latuda  and worsened since increasing Latuda  to 80 mg.  Plan to decrease Latuda  to 20 mg to decrease akathisia/EPS risk while also avoiding withdrawal syndrome from the Latuda ; after discussion of this plan with patient he wanted to discontinue immediately.  Patient received Ativan  that is already ordered as well as her propranolol  dose once at time of interview due to concern from akathisia.  We will assess his response to these medications and also trial dose of benztropine  IV 2 mg at 1500 once the response from the ativan /propranolol  already given has been observed.   After trialing Ativan  0.5 which was already in place by primary team PRN, patient had some response and improvement in his dyskinetic movements and perceived anxiety.  Patient was then trialed after some washout time on benztropine  2 mg IV  and did not have improvement in his symptoms and some side effects of disorientation after dose.  Failed trial to benztropine  decreases likelihood of drug-induced parkinsonism.  Given suspicion for akathisia we will also trial patient on propranolol  to assess if he has a response to this medication.  Patient is already on clonazepam  and reasonable to continue this given current akathisia but could consider  de-escalating in the future as patient has well-documented history of delirium in the hospital.   Furthermore, mirtazapine  is reasonable to start as patient is having significant depression, has evidence for panic disorder which patient reports history of, has evidence for akathisia (see reference below), helps with insomnia which he is experiencing, helps with appetite which is poor for him currently, and has lower risk of switching if patient does have true bipolar disorder.  On chart review patient does not have any documented manic like episodes that did not involve a positive amphetamine suggesting that patient is presenting with meth intoxication.  Patient's historical manic like episodes which occurred outside of substance use do not appear consistent with a true manic episode as he is only endorsing increased mood, decreased sleep, and no associated dysfunction or increased activity levels or other features of mania.  No family history of bipolar disorder.  Of note patient may also have some changes in his baseline mentation due to the recurrent subdural hematomas and the neurosurgical procedures and complications which in itself can be risk factors for traumatic brain injury sequelae which may be also affecting his functioning.  Furukawa Y, Imai K, Takahashi Y, Efthimiou O, Leucht S. Comparative Efficacy and Acceptability of Treatment Strategies for Antipsychotic-Induced Akathisia: A Systematic Review and Network Meta-analysis. Schizophr Bull. 2024 Jun 13:sbae098. doi: 10.1093/schbul/sbae098. Epub ahead of print. PMID: 61130822.  08/23/2024 Continued adra. Having some improvement to propranolol  but not completely. Increased propranolol  to 20 and could be further increased. If BP issues, can cut back on other meds for BP. Some benefit from mirtazapine  as well. No manic switching. No other EPS including parkinsonsim on passive ROM. Movement appears to worsen during interivew voluntary so may be  a component of intentional/voluntary component from patient but either way necessary to treat as if akithisia as cannot miss.   08/24/2024: The patient was seen and reevaluated.  He was sitting on the side of the bed and appeared to be fairly pleasant and cooperative.  He is currently taking propranolol  and appears to be tolerating it.  On the combination of the medications, patient's symptoms appear to be improving.  There is still some minimal jaw movement but overall he appears much improved.    Diagnoses:  Active Hospital problems: Principal Problem:   Subdural hematoma (HCC) Active Problems:   Bipolar disorder (HCC)   Essential hypertension   Alcohol withdrawal (HCC)   Unable to make decisions about medical treatment due to impaired mental capacity   Protein-calorie malnutrition, severe   Acute metabolic encephalopathy   Infection of craniotomy plate    Plan   ## Psychiatric Medication Recommendations:  Continue mirtazapine  15 mg once daily at bedtime for akathisia, insomnia, mood; monitor for mania switching but has lower risk of manic switch than other antidepressant if true bipolar Continue propranolol   20 mg 3 times daily for akathisia, anxiety, TBI; monitor response consider titrating as tolerated Reasonable to continue clonazepam  in the setting of current akathesia but could consider titrating off this medication in the future especially if having delirium  Consider pregabalin for TBI / anxiety in future  ##  Medical Decision Making Capacity: Not specifically addressed in this encounter  ## Further Work-up:  -- TSH 1 month ago 0.775 While pt on Qtc prolonging medications, please monitor & replete K+ to 4 and Mg2+ to 2 -- most recent EKG on 11/19 had QtC of 414 -- Pertinent labwork reviewed earlier this admission includes: GFR greater than 60, LFT relatively within normal limits mildly elevated ALT, hemoglobin 11-12   ## Disposition:-- need to treat akithesia prior to  discharge given substant distress and higher risk for suicide attempt with adra  ## Behavioral / Environmental: -Delirium Precautions: Delirium Interventions for Nursing and Staff: - RN to open blinds every AM. - To Bedside: Glasses, hearing aide, and pt's own shoes. Make available to patients. when possible and encourage use. - Encourage po fluids when appropriate, keep fluids within reach. - OOB to chair with meals. - Passive ROM exercises to all extremities with AM & PM care. - RN to assess orientation to person, time and place QAM and PRN. - Recommend extended visitation hours with familiar family/friends as feasible. - Staff to minimize disturbances at night. Turn off television when pt asleep or when not in use.    ## Safety and Observation Level:  - Based on my clinical evaluation, I estimate the patient to be at low risk of self harm in the current setting. - At this time, we recommend  routine. This decision is based on my review of the chart including patient's history and current presentation, interview of the patient, mental status examination, and consideration of suicide risk including evaluating suicidal ideation, plan, intent, suicidal or self-harm behaviors, risk factors, and protective factors. This judgment is based on our ability to directly address suicide risk, implement suicide prevention strategies, and develop a safety plan while the patient is in the clinical setting. Please contact our team if there is a concern that risk level has changed.  CSSR Risk Category:C-SSRS RISK CATEGORY: No Risk  Suicide Risk Assessment: Patient has following modifiable risk factors for suicide: untreated depression, which we are addressing by medication adjustments. Patient has following non-modifiable or demographic risk factors for suicide: Male and psychiatric hospitalization Patient has the following protective factors against suicide: n/a  Thank you for this consult request.  Recommendations have been communicated to the primary team.  We will continue to follow at this time.   PAULETTE BEETS, MD       History of Present Illness  Relevant Aspects of [facility] Course per hospitalist:  PCCM Xfer 40/32. 58 year old male, homeless, medical history significant for alcohol use disorder, bipolar and panic disorder, [facility] jail [facility] September 2025 with a large subdural hematoma.  Underwent craniotomy with flap replacement on 10/27.  Since then multiple OR visits including 10/30 for SD[facility] evacuation, follow-up CT head 11/8 showed fluid collection underneath the bone flap, back to OR on 11/10 and the bone flap was removed, cultures were obtained, intraoperative cultures grew MRSA, treating for infected bone flap, ID on board and currently on IV vancomycin . Refer to PCCM progress note from 11/11 for detailed tabulation of hospital events until then.  Patient Report:  Patient seen on 08/24/2024 and reports that he continues to see some benefit on the combination of medications.  He still has some mild hand and mouth movements.  He slept better.  Denies any racing thoughts and denies any increased anxiety.  He denies any active SI/HI/AVH.  No adverse events on medications.  He appears to be stable.  Psych ROS:  Depression: Reports he has been having depressed mood, anhedonia, worthlessness, at lowest in his life, but denies suicidal thoughts but does have morbid rumination Anxiety: Reports that he is an anxious person but that the anxiety has been much worse since being the hospital.  Says that he feels restless and is having issues sleeping because of it.  Reports that he is anxious about his situation where he will go from here. Mania (lifetime and current): Endorses having manic episodes but when asking about these episodes he does not describe any increased goal-directed activity, denies any decreased need for sleep, but does endorse feeling  better, talking more, and impulsivity.  Denies having any dysfunction during these times.  Denies having any distractibility, grandiosity, flight of ideas, risky behaviors. Psychosis: (lifetime and current): Denies AVH or paranoia. Sleep: Reports that he cannot sleep with the shaking and in general   Psychiatric and Social History  Psychiatric History:  Information collected from patient and chart review  Prev Dx/Sx: Bipolar disorder, methamphetamine use, substance-induced mood disorder, alcohol use disorder Current Psych Provider: None Home Meds (current): Restarted on Latuda  40 and titrated to 80 this hospitalization, was not taking anything prior to admission in the short period that  He was away from hospital.  Also restarted on Klonopin  Previous Med Trials: Lithium , Klonopin , Latuda , Haldol  as needed for agitation Therapy: None  Prior Psych Hospitalization: Previous hospitalizations although the psychiatric hospitalizations are not documented but the emergency department visits prior to have been documented and include episodes documented as mania but are occurring in the presence of positive amphetamine Prior Self Harm: Denies Prior Violence: Denies  Family Psych History: Denies Family Hx suicide: Denies  Social History:  Reports that he has been experiencing homelessness.  Does not report any recent incarceration which appears to conflict with previous chart review.  Does report he has a upcoming court date on December 7.  Denies access to weapons.  Reports that his hobby is making rock music.  Reports that he has a friend who is his international aid/development worker. Access to weapons/lethal means: Denies  Substance History Denies using any substances recently.  Has been in controlled setting.  Does have history of using stimulants but he denies when asked during interview.  Exam Findings  Physical Exam:  Vital Signs:  Temp:  [97.3 F (36.3 C)-98.8 F (37.1 C)] 98.8 F (37.1 C) (11/22  0856) Pulse Rate:  [55-65] 64 (11/22 0856) Resp:  [17-19] 19 (11/22 0856) BP: (81-116)/(55-79) 112/79 (11/22 0857) SpO2:  [95 %-100 %] 96 % (11/22 0107) Blood pressure 112/79, pulse 64, temperature 98.8 F (37.1 C), resp. rate 19, height 5' 10 (1.778 m), weight 61.7 kg, SpO2 96%. Body mass index is 19.52 kg/m.  Physical Exam Constitutional:      Comments: Chronic ill appearing  Pulmonary:     Effort: Pulmonary effort is normal.  Neurological:     Comments: No pain or stiffness with passive ROM of bilateral upper extremities.       Mental Status Exam: General Appearance: Patient is present in his room sitting in bed unable to move himself very well, blinds are closed, laying in clean gown, neurosurgical scar on head,  Orientation: Oriented to person place but not time  Memory: Did not formally assess  Concentration: Did not formally assess but grossly intact  Recall: Grossly intact but did not formally assess  Attention grossly intact but not formally assessed  Eye Contact: Good  Speech: Fair, dysarthric  Language: Fair  Volume: Normal  Mood: Lowest point my life  Affect: Depressed, flat, anxious  Thought Process: Coherent linear  Thought Content: Logical.  Morbid ruminations.  Worried about current situation and future.  Suicidal Thoughts:  No  Homicidal Thoughts:  No  Judgement:  Fair  Insight:  Fair  Psychomotor Activity:  Increased, dyskinetic movements of mouth and hands.  With mouth movement is sliding jaw back and forth.  There is some voluntary inhibition but is overall involuntary when he is not overriding.   Akathisia:  Yes  Fund of Knowledge:  Fair      Assets:  Desire for Improvement  Cognition: Did not formally assess  ADL's: Impaired by patient's movement limitations but is improving  AIMS (if indicated):  0     Other History   These have been pulled in through the EMR, reviewed, and updated if appropriate.  Family History:  The patient's family  history is not on file.  Medical History: Past Medical History:  Diagnosis Date   Anxiety    Bipolar 1 disorder (HCC)    Depression    Hepatitis    Hypertension    MRSA (methicillin resistant Staphylococcus aureus)    5/25 arm abscess and 11/25    Surgical History: Past Surgical History:  Procedure Laterality Date   BURR HOLE N/A 08/01/2024   Procedure: CREATION, CRANIAL BURR HOLE WITH EVACUATION OF HEMATOMA;  Surgeon: Rosslyn Dino HERO, MD;  Location: [facility] OR;  Service: Neurosurgery;  Laterality: N/A;   BURR HOLE N/A 08/12/2024   Procedure: left bone flap removal;  Surgeon: Rosslyn Dino HERO, MD;  Location:  [facility] OR;  Service: Neurosurgery;  Laterality: N/A;   CRANIOPLASTY, WITH BONE FLAP REPLACEMENT Left 07/29/2024   Procedure: CRANIOPLASTY, WITH BONE FLAP REPLACEMENT;  Surgeon: Rosslyn Dino HERO, MD;  Location: [facility] OR;  Service: Neurosurgery;  Laterality: Left;  LEFT CRANIOPLASTY WITH ARTIFICAL BONE FLAP REPLACEMENT   CRANIOTOMY Left 06/30/2024   Procedure: CRANIOTOMY HEMATOMA EVACUATION SUBDURAL;  Surgeon: Melonie Grass, MD;  Location: [facility] ORS;  Service: Neurosurgery;  Laterality: Left;     Medications:   Current Facility-Administered Medications:    acetaminophen  (TYLENOL ) tablet 650 mg, 650 mg, Oral, Q4H PRN, 650 mg at 08/24/24 0537 **OR** [PENDING] acetaminophen  (TYLENOL ) tablet 650 mg, 650 mg, Per Tube, Q4H PRN **OR** acetaminophen  (TYLENOL ) suppository 650 mg, 650 mg, Rectal, Q4H PRN **OR** [PENDING] acetaminophen  (TYLENOL ) suppository 650 mg, 650 mg, Rectal, Q4H PRN,    Chlorhexidine  Gluconate Cloth 2 % PADS 6 each, 6 each, Topical, Daily, Franky Redia SAILOR, MD, 6 each at 08/24/24 0856   clonazePAM  (KLONOPIN ) tablet 0.5 mg, 0.5 mg, Oral, BID, Krishnan, Gokul, MD, 0.5 mg at 08/24/24 0854   docusate (COLACE) 50 MG/5ML liquid 100 mg, 100 mg, Oral, Daily, Hongalgi, Anand D, MD, 100 mg at 08/24/24 9146   docusate sodium  (COLACE) capsule 100 mg, 100 mg, Oral, BID PRN, Autry,  Lauren E, PA-C, 100 mg at 08/17/24 2118   feeding supplement (ENSURE PLUS HIGH PROTEIN) liquid 237 mL, 237 mL, Oral, BID BM, Danford, Lonni SQUIBB, MD, 237 mL at 08/24/24 0855   folic acid  (FOLVITE ) tablet 1 mg, 1 mg, Oral, Daily, Ogbata, Sylvester I, MD, 1 mg at 08/24/24 0854   gabapentin  (NEURONTIN ) capsule 200 mg, 200 mg, Oral, TID, Danford, Lonni SQUIBB, MD, 200 mg at 08/24/24 0854   heparin  injection 5,000 Units, 5,000 Units, Subcutaneous, Q8H, Desai, Rahul P, PA-C, 5,000 Units at 08/24/24 0536   labetalol  (NORMODYNE ) injection 10-40 mg, 10-40 mg, Intravenous, Q10  min PRN, Janjua, Rashid M, MD   LORazepam  (ATIVAN ) injection 0.5 mg, 0.5 mg, Intravenous, Q6H PRN, Krishnan, Gokul, MD, 0.5 mg at 08/24/24 0849   melatonin tablet 10 mg, 10 mg, Oral, QHS PRN, Shalhoub, George J, MD, 10 mg at 08/23/24 1841   multivitamin with minerals tablet 1 tablet, 1 tablet, Oral, Daily, Ogbata, Sylvester I, MD, 1 tablet at 08/24/24 0854   nicotine  (NICODERM CQ  - dosed in mg/24 hours) patch 14 mg, 14 mg, Transdermal, Daily, Fredia Dorothe HERO, MD, 14 mg at 08/24/24 0855   ondansetron  (ZOFRAN ) tablet 4 mg, 4 mg, Oral, Q4H PRN **OR** ondansetron  (ZOFRAN ) injection 4 mg, 4 mg, Intravenous, Q4H PRN, Janjua, Rashid M, MD, 4 mg at 08/01/24 1506   Oral care mouth rinse, 15 mL, Mouth Rinse, PRN, Mannam, Praveen, MD   pantoprazole  (PROTONIX ) EC tablet 40 mg, 40 mg, Oral, QHS, Hongalgi, Anand D, MD, 40 mg at 08/23/24 2141   polyethylene glycol (MIRALAX  / GLYCOLAX ) packet 17 g, 17 g, Oral, Daily, Desai, Rahul P, PA-C, 17 g at 08/24/24 0855   promethazine  (PHENERGAN ) tablet 12.5-25 mg, 12.5-25 mg, Oral, Q4H PRN, Janjua, Rashid M, MD   propranolol  (INDERAL ) tablet 20 mg, 20 mg, Oral, TID, McCarty, Artie, MD, 20 mg at 08/24/24 0857   senna (SENOKOT) tablet 17.2 mg, 2 tablet, Oral, Daily, Desai, Rahul P, PA-C, 17.2 mg at 08/24/24 0853   sodium chloride  flush (NS) 0.9 % injection 10-40 mL, 10-40 mL, Intracatheter, Q12H, Franky Redia SAILOR, MD, 10 mL at 08/24/24 0858   sodium chloride  flush (NS) 0.9 % injection 10-40 mL, 10-40 mL, Intracatheter, PRN, Franky Redia SAILOR, MD, 30 mL at 07/29/24 1135   thiamine  (VITAMIN B1) tablet 100 mg, 100 mg, Oral, Daily, Hongalgi, Anand D, MD, 100 mg at 08/24/24 0853   vancomycin  (VANCOCIN ) IVPB 1000 mg/200 mL premix, 1,000 mg, Intravenous, Q12H, Clair Lynwood CROME, RPH, Last Rate: 200 mL/hr at 08/24/24 0903, 1,000 mg at 08/24/24 9096  Allergies: No Known Allergies  PAULETTE BEETS, MD

## 2024-08-24 NOTE — Plan of Care (Signed)
  Problem: Education: Goal: Knowledge of General Education information will improve Description: Including pain rating scale, medication(s)/side effects and non-pharmacologic comfort measures Outcome: Progressing   Problem: Health Behavior/Discharge Planning: Goal: Ability to manage health-related needs will improve Outcome: Progressing   Problem: Clinical Measurements: Goal: Ability to maintain clinical measurements within normal limits will improve Outcome: Progressing Goal: Will remain free from infection Outcome: Progressing Goal: Diagnostic test results will improve Outcome: Progressing Goal: Respiratory complications will improve Outcome: Progressing Goal: Cardiovascular complication will be avoided Outcome: Progressing   Problem: Activity: Goal: Risk for activity intolerance will decrease Outcome: Progressing   Problem: Nutrition: Goal: Adequate nutrition will be maintained Outcome: Progressing   Problem: Coping: Goal: Level of anxiety will decrease Outcome: Progressing   Problem: Elimination: Goal: Will not experience complications related to bowel motility Outcome: Progressing Goal: Will not experience complications related to urinary retention Outcome: Progressing   Problem: Pain Managment: Goal: General experience of comfort will improve and/or be controlled Outcome: Progressing   Problem: Safety: Goal: Ability to remain free from injury will improve Outcome: Progressing   Problem: Skin Integrity: Goal: Risk for impaired skin integrity will decrease Outcome: Progressing   Problem: Education: Goal: Knowledge of the prescribed therapeutic regimen will improve Outcome: Progressing   Problem: Clinical Measurements: Goal: Usual level of consciousness will be regained or maintained. Outcome: Progressing Goal: Neurologic status will improve Outcome: Progressing Goal: Ability to maintain intracranial pressure will improve Outcome: Progressing    Problem: Skin Integrity: Goal: Demonstration of wound healing without infection will improve Outcome: Progressing

## 2024-08-24 NOTE — Progress Notes (Signed)
 PROGRESS NOTE   Ernest Romero  FMW:[id number]    DOB: [birth year]    DOA: 07/15/2024  PCP: [name] Course:  PCCM Xfer 66/55. 58 year old male, homeless, medical history significant for alcohol use disorder, bipolar and panic disorder, initially presented to [facility] from jail in September 2025 with a large subdural hematoma.  Underwent craniotomy with flap replacement on 10/27.  Since then multiple OR visits including 10/30 for SDH evacuation, follow-up CT head 11/8 showed fluid collection underneath the bone flap, back to OR on 11/10 and the bone flap was removed, cultures were obtained, intraoperative cultures grew MRSA, treating for infected bone flap, ID on board and currently on IV vancomycin . Refer to PCCM progress note from 11/11 for detailed tabulation of hospital events until then.   Assessment & Plan:   Subdural hematoma  S/P initial craniotomy 9/28 (Dr. Loa poor barr)  S/P skull flap left cranioplasty 10/27 (Dr. Rosslyn).  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 Patient being followed periodically by neurosurgery, Dr. Rosslyn. Patient was seen by infectious disease.  Patient remains on vancomycin .  End date for IV antibiotics is 09/23/2024.  Following IV antibiotics patient to be treated with doxycycline  for 8 weeks. PICC line to be placed closer to discharge. Patient remains stable from neurological standpoint. Patient has completed 1 week of Keppra . Still has sutures.  Surgery was on 11/10.  Dr. Janjua mentions that sutures should come out 3 weeks postoperatively.  So sutures should come out on December 1.   Therapies recommend SNF.  TOC is following.     Acute metabolic encephalopathy At baseline, patient reportedly without cognitive impairment, was homeless and independent caring for self. Per prior review of Dr. Rosario, [facility] MD note from 11/10, patient unable to make decisions about medical treatment  due to impaired capacity, secondary to neurosurgical issues noted above and multifactorial. Also per Dr. Donn note from 7/10, given his current deficits in planning/executive function/memory, patient is likely to need guardianship for decisions i.e. placement. Patient's support counselor Larnell [name] has been visiting intermittently. [phone]).  Per information available to medical team, he does not have kids, his spouse passed away. Has sister, estranged.  However in the last 3 to 4 days patient is mentation has improved.  He is able to think objectively and is more aware of his medical issues.  Seems to have better judgment now.  Should be able to decide for himself going forward.  Bipolar disorder/akathisia Patient with longstanding history of bipolar disorder.  Was on lithium  previously but has not been on it since 10/27.  Patient was requiring Haldol .  Patient's Latuda  dose was increased. Patient was noted to have facial tremors 11/20.  Concern for tardive dyskinesia.  Psychiatry was consulted to assist with management.  Haldol  and Latuda  were discontinued.  EEG does not show any seizure activity. Patient was started on propranolol  and Ativan  as needed.  Clonazepam  was continued. His facial tremors appears to have improved.  Continue current management for now.     History of alcohol, tobacco use disorder - thiamine , folic acid , multivitamin  - Continue PTA Klonopin , dose reduced on 11/19. Treated for alcohol withdrawal delirium with phenobarbital  taper at [facility].  Alcohol withdrawal resolved.   Essential HTN Low blood pressure readings noted.  Amlodipine  and losartan  discontinued.  Patient was started on propranolol  for his facial tremors which is likely contributing to low blood pressures.  Patient is asymptomatic.  Constipation Continue bowel regimen.  Nutrition Status: Nutrition Problem: Severe Malnutrition Etiology: acute illness Signs/Symptoms: energy intake < or equal to  50% for > or equal to 5 days, severe muscle depletion, moderate fat depletion Interventions: Tube feeding Agree with dietitian evaluation and management.  Body mass index is 19.52 kg/m.   DVT prophylaxis: Subcutaneous heparin    Code Status: Full Code:  Family Communication: None at bedside. Disposition: SNF when medically stable.  TOC is following closely.  They have been communicating with patient's support counselor.  Support counselor is trying to arrange an apartment for him.   Consultants:   PCCM Neurology Neurosurgery Psychiatry Infectious disease  Procedures:   As noted above  Subjective:  Patient feels well.  Overall his tremors have improved although they have not subsided completely as yet.  Objective:   Vitals:   08/24/24 0125 08/24/24 0653 08/24/24 0856 08/24/24 0857  BP: 97/61 101/76 112/79 112/79  Pulse: (!) 57 (!) 55 64   Resp:  17 19   Temp:  (!) 97.5 F (36.4 C) 98.8 F (37.1 C)   TempSrc:      SpO2:      Weight:      Height:        General appearance: Awake alert.  In no distress Resp: Clear to auscultation bilaterally.  Normal effort Cardio: S1-S2 is normal regular.  No S3-S4.  No rubs murmurs or bruit GI: Abdomen is soft.  Nontender nondistended.  Bowel sounds are present normal.  No masses organomegaly Improved facial tremors.  Data Reviewed:     CBC: Recent Labs  Lab 08/18/24 0610 2024-09-15 0134  WBC 5.2 7.6  HGB 11.8* 12.4*  HCT 34.7* 38.1*  MCV 88.3 89.9  PLT 351 334    Basic Metabolic Panel: Recent Labs  Lab 08/18/24 0610 08/21/24 0351 September 15, 2024 0134  NA 139 139 137  K 4.0 4.5 4.0  CL 105 105 101  CO2 25 22 27   GLUCOSE 114* 98 98  BUN 20 21* 22*  CREATININE 0.82 0.94 0.83  CALCIUM 9.0 9.5 9.4  MG  --   --  2.2     Microbiology Studies:   No results found for this or any previous visit (from the past 240 hours).   Radiology Studies:  EEG adult Result Date: 2024-09-15 Camara, Amadou, MD     09-15-2024  2:52  PM Patient Name: Ernest Romero MRN: [id number] Epilepsy Attending: Pastor Falling Referring Physician/Provider: Awanda [place], MD     Date: 2024/09/15 Duration: 30 minutes Patient history: 58 with subdural hematoma s/p craniotomy Level of alertness: Awake, drowsy, sleep AEDs during EEG study: Gabapentin   Technical aspects: This EEG study was done with scalp electrodes positioned according to the 10-20 International system of electrode placement. Electrical activity was reviewed with band pass filter of 1-70Hz , sensitivity of 7 uV/mm, display speed of 54mm/sec with a 60Hz  notched filter applied as appropriate. EEG data were recorded continuously and digitally stored.  Video monitoring was available and reviewed as appropriate. Description: The posterior dominant rhythm consists of 8.5 Hz activity of moderate voltage (25-35 uV) seen predominantly in posterior head regions, symmetric and reactive to eye opening and eye closing. Drowsiness was characterized by attenuation of the posterior background rhythm. Stage 1 sleep was seen. There was presence of continuous left hemispheric slowing. There was additional high amplitude sharply contoured waves consistent with breach rhythm. Hyperventilation and photic stimulation were not performed.   ABNORMALITY - Continuous left hemispheric slowing - Left sided high  voltage, sharply contoured waves consistent with breach rhythm IMPRESSION: This study is suggestive of cortical dysfunction arising from left hemisphere, nonspecific etiology, likely secondary to underlying structural abnormality. There is also presence of breach rhythm in the left hemisphere consistent with the patient known history of craniotomy.  No seizures or epileptiform discharges were seen throughout the recording. Amadou Camara MD Neurology       Scheduled Meds:    Chlorhexidine  Gluconate Cloth  6 each Topical Daily   clonazePAM   0.5 mg Oral BID   docusate  100 mg Oral Daily   feeding supplement  237 mL  Oral BID BM   folic acid   1 mg Oral Daily   gabapentin   200 mg Oral TID   heparin  injection (subcutaneous)  5,000 Units Subcutaneous Q8H   multivitamin with minerals  1 tablet Oral Daily   nicotine   14 mg Transdermal Daily   pantoprazole   40 mg Oral QHS   polyethylene glycol  17 g Oral Daily   propranolol   20 mg Oral TID   senna  2 tablet Oral Daily   sodium chloride  flush  10-40 mL Intracatheter Q12H   thiamine   100 mg Oral Daily    Continuous Infusions:    vancomycin  1,000 mg (08/24/24 0903)     LOS: 40 days     Joette Pebbles, MD,    To contact the attending provider between 7A-7P or the covering provider during after hours 7P-7A, please log into the web site [email or web address] and access using universal [place] password for that web site. If you do not have the password, please call the hospital operator.  08/24/2024, 10:47 AM

## 2024-08-25 LAB — BASIC METABOLIC PANEL WITH GFR
Anion gap: 12 (ref 5–15)
BUN: 24 mg/dL — ABNORMAL HIGH (ref 6–20)
CO2: 24 mmol/L (ref 22–32)
Calcium: 9.3 mg/dL (ref 8.9–10.3)
Chloride: 104 mmol/L (ref 98–111)
Creatinine, Ser: 0.93 mg/dL (ref 0.61–1.24)
GFR, Estimated: >60 mL/min (ref >60)
Glucose, Bld: 99 mg/dL (ref 70–99)
Potassium: 4.1 mmol/L (ref 3.5–5.1)
Sodium: 140 mmol/L (ref 135–145)

## 2024-08-25 NOTE — Progress Notes (Signed)
 PROGRESS NOTE   Ernest Romero  FMW:[id number]    DOB: Dec 06, 1965    DOA: 07/15/2024  PCP: [name] Course:  PCCM Xfer 50/83. 58 year old male, homeless, medical history significant for alcohol use disorder, bipolar and panic disorder, initially presented to [facility] from jail in September 2025 with a large subdural hematoma.  Underwent craniotomy with flap replacement on 10/27.  Since then multiple OR visits including 10/30 for SDH evacuation, follow-up CT head 11/8 showed fluid collection underneath the bone flap, back to OR on 11/10 and the bone flap was removed, cultures were obtained, intraoperative cultures grew MRSA, treating for infected bone flap, ID on board and currently on IV vancomycin . Refer to PCCM progress note from 11/11 for detailed tabulation of hospital events until then.   Assessment & Plan:   Subdural hematoma  S/P initial craniotomy 9/28 (Dr. Loa poor barr)  S/P skull flap left cranioplasty 10/27 (Dr. Rosslyn).  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 Patient being followed periodically by neurosurgery, Dr. Rosslyn. Patient was seen by infectious disease.  Patient remains on vancomycin .  End date for IV antibiotics is 09/23/2024.  Following IV antibiotics patient to be treated with doxycycline  for 8 weeks. PICC line to be placed closer to discharge. Patient remains stable from neurological standpoint. Patient has completed 1 week of Keppra . Still has sutures.  Surgery was on 11/10.  Dr. Janjua mentions that sutures should come out 3 weeks postoperatively.  So sutures should come out on December 1.   Therapies recommend SNF.  TOC is following.     Acute metabolic encephalopathy At baseline, patient reportedly without cognitive impairment, was homeless and independent caring for self. Per prior review of Dr. Rosario, [facility] MD note from 11/10, patient unable to make decisions about medical treatment  due to impaired capacity, secondary to neurosurgical issues noted above and multifactorial. Also per Dr. Donn note from 7/10, given his current deficits in planning/executive function/memory, patient is likely to need guardianship for decisions i.e. placement. Patient's support counselor Larnell [name] has been visiting intermittently. [phone]).  Per information available to medical team, he does not have kids, his spouse passed away. Has sister, estranged.  However in the last 3 to 4 days patient is mentation has improved.  He is able to think objectively and is more aware of his medical issues.  Seems to have better judgment now.  Should be able to decide for himself going forward.  Bipolar disorder/akathisia Patient with longstanding history of bipolar disorder.  Was on lithium  previously but has not been on it since 10/27.  Patient was requiring Haldol .  Patient's Latuda  dose was increased. Patient was noted to have facial tremors 11/20.  Concern for tardive dyskinesia.  Psychiatry was consulted to assist with management.  Haldol  and Latuda  were discontinued.   EEG does not show any seizure activity. Patient was started on propranolol  and Ativan  as needed.  Clonazepam  was continued. Yesterday was felt that the facial tremors had improved but noted to be worse this morning.  Appreciate psychiatry assistance.  They recommend continuing current medications without any changes for now.  It can take up to 7 days for symptoms to resolve.   History of alcohol, tobacco use disorder Continue thiamine , folic acid , multivitamin  Treated for alcohol withdrawal delirium with phenobarbital  taper at [facility].  Alcohol withdrawal resolved.   Essential HTN Low blood pressure readings noted.  Amlodipine  and losartan  discontinued.  Patient was started on propranolol  for his facial tremors which is likely contributing to low blood pressures.  Patient is asymptomatic.   Blood pressure has  stabilized.  Constipation Continue bowel regimen.  Nutrition Status: Nutrition Problem: Severe Malnutrition Etiology: acute illness Signs/Symptoms: energy intake < or equal to 50% for > or equal to 5 days, severe muscle depletion, moderate fat depletion Interventions: Tube feeding Agree with dietitian evaluation and management.  Body mass index is 20.56 kg/m.   DVT prophylaxis: Subcutaneous heparin    Code Status: Full Code:  Family Communication: None at bedside. Disposition: SNF when medically stable.  TOC is following closely.  They have been communicating with patient's support counselor.  Support counselor is trying to arrange an apartment for him.   Consultants:   PCCM Neurology Neurosurgery Psychiatry Infectious disease  Procedures:   As noted above  Subjective:  He is bothered by his facial tremors.  No other complaints offered.  Objective:   Vitals:   08/24/24 1954 08/25/24 0426 08/25/24 0500 08/25/24 0801  BP: 107/70 101/75  103/66  Pulse: 64 63  60  Resp: 18 16  (!) 22  Temp: 97.9 F (36.6 C) 97.8 F (36.6 C)  (!) 97.3 F (36.3 C)  TempSrc:    Oral  SpO2: 98% 99%  97%  Weight:   65 kg   Height:        General appearance: Awake alert.  In no distress Resp: Clear to auscultation bilaterally.  Normal effort Cardio: S1-S2 is normal regular.  No S3-S4.  No rubs murmurs or bruit GI: Abdomen is soft.  Nontender nondistended.  Bowel sounds are present normal.  No masses organomegaly Facial tremors noted  Data Reviewed:     CBC: Recent Labs  Lab 08/23/24 0134  WBC 7.6  HGB 12.4*  HCT 38.1*  MCV 89.9  PLT 334    Basic Metabolic Panel: Recent Labs  Lab 08/21/24 0351 08/23/24 0134  NA 139 137  K 4.5 4.0  CL 105 101  CO2 22 27  GLUCOSE 98 98  BUN 21* 22*  CREATININE 0.94 0.83  CALCIUM 9.5 9.4  MG  --  2.2     Microbiology Studies:   No results found for this or any previous visit (from the past 240 hours).   Radiology Studies:   EEG adult Result Date: 08/23/2024 Camara, Amadou, MD     08/23/2024  2:52 PM Patient Name: Ernest Romero MRN: [id number] Epilepsy Attending: Pastor Falling Referring Physician/Provider: Awanda City, MD     Date: 08/23/2024 Duration: 30 minutes Patient history: 58 with subdural hematoma s/p craniotomy Level of alertness: Awake, drowsy, sleep AEDs during EEG study: Gabapentin   Technical aspects: This EEG study was done with scalp electrodes positioned according to the 10-20 International system of electrode placement. Electrical activity was reviewed with band pass filter of 1-70Hz , sensitivity of 7 uV/mm, display speed of 28mm/sec with a 60Hz  notched filter applied as appropriate. EEG data were recorded continuously and digitally stored.  Video monitoring was available and reviewed as appropriate. Description: The posterior dominant rhythm consists of 8.5 Hz activity of moderate voltage (25-35 uV) seen predominantly in posterior head regions, symmetric and reactive to eye opening and eye closing. Drowsiness was characterized by attenuation of the posterior background rhythm. Stage 1 sleep was seen. There was presence of continuous left hemispheric slowing. There was additional high amplitude sharply contoured waves consistent with breach rhythm. Hyperventilation and photic stimulation were not performed.   ABNORMALITY - Continuous left  hemispheric slowing - Left sided high voltage, sharply contoured waves consistent with breach rhythm IMPRESSION: This study is suggestive of cortical dysfunction arising from left hemisphere, nonspecific etiology, likely secondary to underlying structural abnormality. There is also presence of breach rhythm in the left hemisphere consistent with the patient known history of craniotomy.  No seizures or epileptiform discharges were seen throughout the recording. Amadou Camara MD Neurology       Scheduled Meds:    Chlorhexidine  Gluconate Cloth  6 each Topical Daily   clonazePAM    0.5 mg Oral BID   docusate  100 mg Oral Daily   feeding supplement  237 mL Oral BID BM   folic acid   1 mg Oral Daily   gabapentin   200 mg Oral TID   heparin  injection (subcutaneous)  5,000 Units Subcutaneous Q8H   multivitamin with minerals  1 tablet Oral Daily   nicotine   14 mg Transdermal Daily   pantoprazole   40 mg Oral QHS   polyethylene glycol  17 g Oral Daily   propranolol   20 mg Oral TID   senna  2 tablet Oral Daily   sodium chloride  flush  10-40 mL Intracatheter Q12H   thiamine   100 mg Oral Daily    Continuous Infusions:    vancomycin  1,000 mg (08/25/24 0853)     LOS: 41 days     Joette Pebbles, MD,    To contact the attending provider between 7A-7P or the covering provider during after hours 7P-7A, please log into the web site [email or web address] and access using universal [place] password for that web site. If you do not have the password, please call the hospital operator.  08/25/2024, 10:49 AM

## 2024-08-25 NOTE — Plan of Care (Signed)
  Problem: Coping: Goal: Level of anxiety will decrease Outcome: Progressing   Problem: Elimination: Goal: Will not experience complications related to bowel motility Outcome: Progressing Goal: Will not experience complications related to urinary retention Outcome: Progressing   Problem: Pain Managment: Goal: General experience of comfort will improve and/or be controlled Outcome: Progressing   Problem: Safety: Goal: Ability to remain free from injury will improve Outcome: Progressing   Problem: Skin Integrity: Goal: Risk for impaired skin integrity will decrease Outcome: Progressing   Problem: Education: Goal: Knowledge of the prescribed therapeutic regimen will improve Outcome: Progressing   Problem: Clinical Measurements: Goal: Usual level of consciousness will be regained or maintained. Outcome: Progressing Goal: Neurologic status will improve Outcome: Progressing Goal: Ability to maintain intracranial pressure will improve Outcome: Progressing   Problem: Skin Integrity: Goal: Demonstration of wound healing without infection will improve Outcome: Progressing

## 2024-08-25 NOTE — Plan of Care (Signed)
  Problem: Education: Goal: Knowledge of General Education information will improve Description: Including pain rating scale, medication(s)/side effects and non-pharmacologic comfort measures Outcome: Progressing   Problem: Health Behavior/Discharge Planning: Goal: Ability to manage health-related needs will improve Outcome: Progressing   Problem: Clinical Measurements: Goal: Ability to maintain clinical measurements within normal limits will improve Outcome: Progressing Goal: Will remain free from infection Outcome: Progressing Goal: Diagnostic test results will improve Outcome: Progressing Goal: Respiratory complications will improve Outcome: Progressing Goal: Cardiovascular complication will be avoided Outcome: Progressing   Problem: Activity: Goal: Risk for activity intolerance will decrease Outcome: Progressing   Problem: Nutrition: Goal: Adequate nutrition will be maintained Outcome: Progressing   Problem: Coping: Goal: Level of anxiety will decrease Outcome: Progressing   Problem: Elimination: Goal: Will not experience complications related to bowel motility Outcome: Progressing Goal: Will not experience complications related to urinary retention Outcome: Progressing   Problem: Pain Managment: Goal: General experience of comfort will improve and/or be controlled Outcome: Progressing   Problem: Safety: Goal: Ability to remain free from injury will improve Outcome: Progressing   Problem: Skin Integrity: Goal: Risk for impaired skin integrity will decrease Outcome: Progressing   Problem: Education: Goal: Knowledge of the prescribed therapeutic regimen will improve Outcome: Progressing   Problem: Clinical Measurements: Goal: Usual level of consciousness will be regained or maintained. Outcome: Progressing Goal: Neurologic status will improve Outcome: Progressing Goal: Ability to maintain intracranial pressure will improve Outcome: Progressing    Problem: Skin Integrity: Goal: Demonstration of wound healing without infection will improve Outcome: Progressing

## 2024-08-25 NOTE — Consult Note (Signed)
 [facility] Psychiatric Consult Follow-up  Patient Name: .Ernest Romero  MRN: [id number]  DOB: 09-Nov-1965  Consult Order details:  Orders (From admission, onward)     Start     Ordered   08/22/24 0842  IP CONSULT TO PSYCHIATRY       Ordering Provider: Verdene Purchase, MD  Provider:  (Not yet assigned)  Question Answer Comment  Location [facility]   Reason for Consult? patient with h/o bipolar d/o. was on lithium . here with subdural hematoma and MRSA. improving. some facial trmors. Need help with medication management. he was on lithium , lurasidone . requiring haldol  prn.  thanks      08/22/24 0842   08/02/24 1712  IP CONSULT TO PSYCHIATRY       Ordering Provider: Adolph Tinnie FORBES, PA-C  Provider:  (Not yet assigned)  Question Answer Comment  Location [facility]   Reason for Consult? bipolar      08/02/24 1711   07/24/24 1656  IP CONSULT TO PSYCHIATRY       Ordering Provider: Patsy Lenis, MD  Provider:  (Not yet assigned)  Question Answer Comment  Location [facility]   Reason for Consult? needing formal eval for capacity; may need guardianship given no family/support for discharge      07/24/24 1656   07/16/24 1412  IP CONSULT TO PSYCHIATRY       Ordering Provider: Dino Antu, MD  Provider:  (Not yet assigned)  Question Answer Comment  Location [facility]   Reason for Consult? Homicidal/suicidal comments, bipolar disorder      07/16/24 1412             Mode of Visit: In person    Psychiatry Consult Evaluation  Service Date: August 25, 2024 LOS:  LOS: 41 days  Chief Complaint severe anxiety, mouth tremors hand tremors, severe restlessness that start after admission and worsened since yesterday  Primary Psychiatric Diagnoses  Akithisia (versus intentionally-produced movement) 2.  MDD, recurrent, severe, without psychotic features 3.  Stimulant use disorder, in controlled  setting Rule out traumatic brain injury from chronic subdural hematomas and neurosurgical procedures and complications Rule out bipolar disorder but likely previous manic episodes were meth induced per chart review  Assessment  Ernest Romero is a 58 y.o. male admitted: Medicallyfor 07/15/2024 11:15 PM for recurrent subdural hematoma. He carries the psychiatric diagnoses of bipolar disorder and alcohol use disorder and has a past medical history of subdural hematoma status postcraniotomy.   Current presentation with worsening anxiety, restlessness, dyskinetic movements of face and hands is most likely consistent with akathisia as these experience started on admission when he was restarted on Latuda  and worsened since increasing Latuda  to 80 mg.  Plan to decrease Latuda  to 20 mg to decrease akathisia/EPS risk while also avoiding withdrawal syndrome from the Latuda ; after discussion of this plan with patient he wanted to discontinue immediately.  Patient received Ativan  that is already ordered as well as her propranolol  dose once at time of interview due to concern from akathisia.  We will assess his response to these medications and also trial dose of benztropine  IV 2 mg at 1500 once the response from the ativan /propranolol  already given has been observed.   After trialing Ativan  0.5 which was already in place by primary team PRN, patient had some response and improvement in his dyskinetic movements and perceived anxiety.  Patient was then trialed after some washout time on benztropine  2 mg IV  and did not have improvement in his symptoms and some side effects of disorientation after dose.  Failed trial to benztropine  decreases likelihood of drug-induced parkinsonism.  Given suspicion for akathisia we will also trial patient on propranolol  to assess if he has a response to this medication.  Patient is already on clonazepam  and reasonable to continue this given current akathisia but could consider  de-escalating in the future as patient has well-documented history of delirium in the hospital.   Furthermore, mirtazapine  is reasonable to start as patient is having significant depression, has evidence for panic disorder which patient reports history of, has evidence for akathisia (see reference below), helps with insomnia which he is experiencing, helps with appetite which is poor for him currently, and has lower risk of switching if patient does have true bipolar disorder.  On chart review patient does not have any documented manic like episodes that did not involve a positive amphetamine suggesting that patient is presenting with meth intoxication.  Patient's historical manic like episodes which occurred outside of substance use do not appear consistent with a true manic episode as he is only endorsing increased mood, decreased sleep, and no associated dysfunction or increased activity levels or other features of mania.  No family history of bipolar disorder.  Of note patient may also have some changes in his baseline mentation due to the recurrent subdural hematomas and the neurosurgical procedures and complications which in itself can be risk factors for traumatic brain injury sequelae which may be also affecting his functioning.  Furukawa Y, Imai K, Takahashi Y, Efthimiou O, Leucht S. Comparative Efficacy and Acceptability of Treatment Strategies for Antipsychotic-Induced Akathisia: A Systematic Review and Network Meta-analysis. Schizophr Bull. 2024 Jun 13:sbae098. doi: 10.1093/schbul/sbae098. Epub ahead of print. PMID: 61130822.  08/23/2024 Continued adra. Having some improvement to propranolol  but not completely. Increased propranolol  to 20 and could be further increased. If BP issues, can cut back on other meds for BP. Some benefit from mirtazapine  as well. No manic switching. No other EPS including parkinsonsim on passive ROM. Movement appears to worsen during interivew voluntary so may be  a component of intentional/voluntary component from patient but either way necessary to treat as if akithisia as cannot miss.   08/24/2024: The patient was seen and reevaluated.  He was sitting on the side of the bed and appeared to be fairly pleasant and cooperative.  He is currently taking propranolol  and appears to be tolerating it.  On the combination of the medications, patient's symptoms appear to be improving.  There is still some minimal jaw movement but overall he appears much improved.  08/25/2024 The patient was seen and reevaluated and chart was reviewed.  Unfortunately there was only transient improvement in his symptoms yesterday but now is back and he appears to be quite bothered by it.  Reevaluated the tremor.  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  Symptoms are not painful.  This is a classical description of a parkinsonian resting jaw tremor contributed by Haldol  and Latuda .  He is already on propranolol  and Klonopin .   Diagnoses:  Active Hospital problems: Principal Problem:   Subdural hematoma (HCC) Active Problems:   Bipolar disorder (HCC)   Essential hypertension   Alcohol withdrawal (HCC)   Unable to make decisions about medical treatment due to impaired mental capacity   Protein-calorie malnutrition, severe   Acute metabolic encephalopathy  Infection of craniotomy plate    Plan   ## Psychiatric Medication Recommendations:  Continue mirtazapine  15 mg once daily at bedtime for akathisia, insomnia, mood; monitor for mania switching but has lower risk of manic switch than other antidepressant if true bipolar Continue propranolol   20 mg 3 times daily for akathisia, anxiety, TBI; monitor response consider titrating as tolerated Reasonable to continue clonazepam  in the setting of current akathesia but could consider titrating off this medication in the future  especially if having delirium  Consider pregabalin for TBI / anxiety in future Resolutions of symptoms can take up to 7 days so we will continue to monitor.  ## Medical Decision Making Capacity: Not specifically addressed in this encounter  ## Further Work-up:  -- TSH 1 month ago 0.775 While pt on Qtc prolonging medications, please monitor & replete K+ to 4 and Mg2+ to 2 -- most recent EKG on 11/19 had QtC of 414 -- Pertinent labwork reviewed earlier this admission includes: GFR greater than 60, LFT relatively within normal limits mildly elevated ALT, hemoglobin 11-12   ## Disposition:-- need to treat akithesia prior to discharge given substant distress and higher risk for suicide attempt with adra  ## Behavioral / Environmental: -Delirium Precautions: Delirium Interventions for Nursing and Staff: - RN to open blinds every AM. - To Bedside: Glasses, hearing aide, and pt's own shoes. Make available to patients. when possible and encourage use. - Encourage po fluids when appropriate, keep fluids within reach. - OOB to chair with meals. - Passive ROM exercises to all extremities with AM & PM care. - RN to assess orientation to person, time and place QAM and PRN. - Recommend extended visitation hours with familiar family/friends as feasible. - Staff to minimize disturbances at night. Turn off television when pt asleep or when not in use.    ## Safety and Observation Level:  - Based on my clinical evaluation, I estimate the patient to be at low risk of self harm in the current setting. - At this time, we recommend  routine. This decision is based on my review of the chart including patient's history and current presentation, interview of the patient, mental status examination, and consideration of suicide risk including evaluating suicidal ideation, plan, intent, suicidal or self-harm behaviors, risk factors, and protective factors. This judgment is based on our ability to directly address  suicide risk, implement suicide prevention strategies, and develop a safety plan while the patient is in the clinical setting. Please contact our team if there is a concern that risk level has changed.  CSSR Risk Category:C-SSRS RISK CATEGORY: No Risk  Suicide Risk Assessment: Patient has following modifiable risk factors for suicide: untreated depression, which we are addressing by medication adjustments. Patient has following non-modifiable or demographic risk factors for suicide: Male and psychiatric hospitalization Patient has the following protective factors against suicide: n/a  Thank you for this consult request. Recommendations have been communicated to the primary team.  We will continue to follow at this time.   PAULETTE BEETS, MD       History of Present Illness  Relevant Aspects of [facility] Hospital Course per hospitalist:  PCCM Xfer 27/88. 58 year old male, homeless, medical history significant for alcohol use disorder, bipolar and panic disorder, [facility] from jail in September 2025 with a large subdural hematoma.  Underwent craniotomy with flap replacement on 10/27.  Since then multiple OR visits including 10/30 for SD[facility] evacuation, follow-up CT head 11/8 showed fluid collection underneath the bone  flap, back to OR on 11/10 and the bone flap was removed, cultures were obtained, intraoperative cultures grew MRSA, treating for infected bone flap, ID on board and currently on IV vancomycin . Refer to PCCM progress note from 11/11 for detailed tabulation of hospital events until then.  Patient Report:  Patient seen on 08/24/2024 and reports that he continues to see some benefit on the combination of medications.  He still has some mild hand and mouth movements.  He slept better.  Denies any racing thoughts and denies any increased anxiety.  He denies any active SI/HI/AVH.  No adverse events on medications.  He appears to be stable.   Psych ROS:  Depression:  Reports he has been having depressed mood, anhedonia, worthlessness, at lowest in his life, but denies suicidal thoughts but does have morbid rumination Anxiety: Reports that he is an anxious person but that the anxiety has been much worse since being the hospital.  Says that he feels restless and is having issues sleeping because of it.  Reports that he is anxious about his situation where he will go from here. Mania (lifetime and current): Endorses having manic episodes but when asking about these episodes he does not describe any increased goal-directed activity, denies any decreased need for sleep, but does endorse feeling better, talking more, and impulsivity.  Denies having any dysfunction during these times.  Denies having any distractibility, grandiosity, flight of ideas, risky behaviors. Psychosis: (lifetime and current): Denies AVH or paranoia. Sleep: Reports that he cannot sleep with the shaking and in general   Psychiatric and Social History  Psychiatric History:  Information collected from patient and chart review  Prev Dx/Sx: Bipolar disorder, methamphetamine use, substance-induced mood disorder, alcohol use disorder Current Psych Provider: None Home Meds (current): Restarted on Latuda  40 and titrated to 80 this hospitalization, was not taking anything prior to admission in the short period that  He was away from hospital.  Also restarted on Klonopin  Previous Med Trials: Lithium , Klonopin , Latuda , Haldol  as needed for agitation Therapy: None  Prior Psych Hospitalization: Previous hospitalizations although the psychiatric hospitalizations are not documented but the emergency department visits prior to have been documented and include episodes documented as mania but are occurring in the presence of positive amphetamine Prior Self Harm: Denies Prior Violence: Denies  Family Psych History: Denies Family Hx suicide: Denies  Social History:  Reports that he has been experiencing  homelessness.  Does not report any recent incarceration which appears to conflict with previous chart review.  Does report he has a upcoming court date on December 7.  Denies access to weapons.  Reports that his hobby is making rock music.  Reports that he has a friend who is his international aid/development worker. Access to weapons/lethal means: Denies  Substance History Denies using any substances recently.  Has been in controlled setting.  Does have history of using stimulants but he denies when asked during interview.  Exam Findings  Physical Exam:  Vital Signs:  Temp:  [97.3 F (36.3 C)-97.9 F (36.6 C)] 97.3 F (36.3 C) (11/23 0801) Pulse Rate:  [60-72] 60 (11/23 0801) Resp:  [16-22] 22 (11/23 0801) BP: (101-107)/(66-75) 103/66 (11/23 0801) SpO2:  [95 %-99 %] 97 % (11/23 0801) Weight:  [65 kg] 65 kg (11/23 0500) Blood pressure 103/66, pulse 60, temperature (!) 97.3 F (36.3 C), temperature source Oral, resp. rate (!) 22, height 5' 10 (1.778 m), weight 65 kg, SpO2 97%. Body mass index is 20.56 kg/m.  Physical Exam Constitutional:  Comments: Chronic ill appearing  Pulmonary:     Effort: Pulmonary effort is normal.  Neurological:     Comments: No pain or stiffness with passive ROM of bilateral upper extremities.       Mental Status Exam: General Appearance: Patient is present in his room sitting in bed unable to move himself very well, blinds are closed, laying in clean gown, neurosurgical scar on head,  Orientation: Oriented to person place but not time  Memory: Did not formally assess  Concentration: Did not formally assess but grossly intact  Recall: Grossly intact but did not formally assess  Attention grossly intact but not formally assessed  Eye Contact: Good  Speech: Fair, dysarthric  Language: Fair  Volume: Normal  Mood: Lowest point my life  Affect: Depressed, flat, anxious  Thought Process: Coherent linear  Thought Content: Logical.  Morbid ruminations.  Worried about  current situation and future.  Suicidal Thoughts:  No  Homicidal Thoughts:  No  Judgement:  Fair  Insight:  Fair  Psychomotor Activity:  Increased, dyskinetic movements of mouth and hands.  With mouth movement is sliding jaw back and forth.  There is some voluntary inhibition but is overall involuntary when he is not overriding.   Akathisia:  Yes  Fund of Knowledge:  Fair      Assets:  Desire for Improvement  Cognition: Did not formally assess  ADL's: Impaired by patient's movement limitations but is improving  AIMS (if indicated):  0     Other History   These have been pulled in through the EMR, reviewed, and updated if appropriate.  Family History:  The patient's family history is not on file.  Medical History: Past Medical History:  Diagnosis Date   Anxiety    Bipolar 1 disorder (HCC)    Depression    Hepatitis    Hypertension    MRSA (methicillin resistant Staphylococcus aureus)    5/25 arm abscess and 11/25    Surgical History: Past Surgical History:  Procedure Laterality Date   BURR HOLE N/A 08/01/2024   Procedure: CREATION, CRANIAL BURR HOLE WITH EVACUATION OF HEMATOMA;  Surgeon: Rosslyn Dino HERO, MD;  Location: [facility] OR;  Service: Neurosurgery;  Laterality: N/A;   BURR HOLE N/A 08/12/2024   Procedure: left bone flap removal;  Surgeon: Rosslyn Dino HERO, MD;  Location: [facility] OR;  Service: Neurosurgery;  Laterality: N/A;   CRANIOPLASTY, WITH BONE FLAP REPLACEMENT Left 07/29/2024   Procedure: CRANIOPLASTY, WITH BONE FLAP REPLACEMENT;  Surgeon: Rosslyn Dino HERO, MD;  Location: [facility] OR;  Service: Neurosurgery;  Laterality: Left;  LEFT CRANIOPLASTY WITH ARTIFICAL BONE FLAP REPLACEMENT   CRANIOTOMY Left 06/30/2024   Procedure: CRANIOTOMY HEMATOMA EVACUATION SUBDURAL;  Surgeon: Melonie Grass, MD;  Location: [facility] ORS;  Service: Neurosurgery;  Laterality: Left;     Medications:   Current Facility-Administered Medications:    acetaminophen  (TYLENOL ) tablet 650 mg, 650 mg,  Oral, Q4H PRN, 650 mg at 08/25/24 0403 **OR** [PENDING] acetaminophen  (TYLENOL ) tablet 650 mg, 650 mg, Per Tube, Q4H PRN **OR** acetaminophen  (TYLENOL ) suppository 650 mg, 650 mg, Rectal, Q4H PRN **OR** [PENDING] acetaminophen  (TYLENOL ) suppository 650 mg, 650 mg, Rectal, Q4H PRN,    Chlorhexidine  Gluconate Cloth 2 % PADS 6 each, 6 each, Topical, Daily, Franky Redia SAILOR, MD, 6 each at 08/24/24 0856   clonazePAM  (KLONOPIN ) tablet 0.5 mg, 0.5 mg, Oral, BID, Krishnan, Gokul, MD, 0.5 mg at 08/25/24 0850   docusate (COLACE) 50 MG/5ML liquid 100 mg, 100 mg, Oral, Daily, Hongalgi, Anand D, MD,  100 mg at 08/24/24 9146   docusate sodium  (COLACE) capsule 100 mg, 100 mg, Oral, BID PRN, Autry, Lauren E, PA-C, 100 mg at 08/25/24 0847   feeding supplement (ENSURE PLUS HIGH PROTEIN) liquid 237 mL, 237 mL, Oral, BID BM, Danford, Lonni SQUIBB, MD, 237 mL at 08/25/24 0950   folic acid  (FOLVITE ) tablet 1 mg, 1 mg, Oral, Daily, Ogbata, Sylvester I, MD, 1 mg at 08/25/24 9152   gabapentin  (NEURONTIN ) capsule 200 mg, 200 mg, Oral, TID, Danford, Lonni SQUIBB, MD, 200 mg at 08/25/24 0845   heparin  injection 5,000 Units, 5,000 Units, Subcutaneous, Q8H, Desai, Rahul P, PA-C, 5,000 Units at 08/25/24 0546   labetalol  (NORMODYNE ) injection 10-40 mg, 10-40 mg, Intravenous, Q10 min PRN, Janjua, Rashid M, MD   LORazepam  (ATIVAN ) injection 0.5 mg, 0.5 mg, Intravenous, Q6H PRN, Krishnan, Gokul, MD, 0.5 mg at 08/25/24 0546   melatonin tablet 10 mg, 10 mg, Oral, QHS PRN, Shalhoub, George J, MD, 10 mg at 08/24/24 2119   multivitamin with minerals tablet 1 tablet, 1 tablet, Oral, Daily, Rosario Eland I, MD, 1 tablet at 08/25/24 9153   nicotine  (NICODERM CQ  - dosed in mg/24 hours) patch 14 mg, 14 mg, Transdermal, Daily, Fredia Dorothe HERO, MD, 14 mg at 08/25/24 0849   ondansetron  (ZOFRAN ) tablet 4 mg, 4 mg, Oral, Q4H PRN **OR** ondansetron  (ZOFRAN ) injection 4 mg, 4 mg, Intravenous, Q4H PRN, Janjua, Rashid M, MD, 4 mg at 08/01/24  1506   Oral care mouth rinse, 15 mL, Mouth Rinse, PRN, Mannam, Praveen, MD   pantoprazole  (PROTONIX ) EC tablet 40 mg, 40 mg, Oral, QHS, Hongalgi, Anand D, MD, 40 mg at 08/24/24 2119   polyethylene glycol (MIRALAX  / GLYCOLAX ) packet 17 g, 17 g, Oral, Daily, Desai, Rahul P, PA-C, 17 g at 08/25/24 0850   promethazine  (PHENERGAN ) tablet 12.5-25 mg, 12.5-25 mg, Oral, Q4H PRN, Janjua, Rashid M, MD   propranolol  (INDERAL ) tablet 20 mg, 20 mg, Oral, TID, McCarty, Artie, MD, 20 mg at 08/25/24 0847   senna (SENOKOT) tablet 17.2 mg, 2 tablet, Oral, Daily, Desai, Rahul P, PA-C, 17.2 mg at 08/25/24 0846   sodium chloride  flush (NS) 0.9 % injection 10-40 mL, 10-40 mL, Intracatheter, Q12H, Franky Redia SAILOR, MD, 10 mL at 08/24/24 2122   sodium chloride  flush (NS) 0.9 % injection 10-40 mL, 10-40 mL, Intracatheter, PRN, Franky Redia SAILOR, MD, 30 mL at 07/29/24 1135   thiamine  (VITAMIN B1) tablet 100 mg, 100 mg, Oral, Daily, Hongalgi, Anand D, MD, 100 mg at 08/25/24 0845   vancomycin  (VANCOCIN ) IVPB 1000 mg/200 mL premix, 1,000 mg, Intravenous, Q12H, Clair Lynwood CROME, RPH, Last Rate: 200 mL/hr at 08/25/24 0853, 1,000 mg at 08/25/24 9146  Allergies: No Known Allergies  PAULETTE BEETS, MD

## 2024-08-26 LAB — BASIC METABOLIC PANEL WITH GFR
Anion gap: 11 (ref 5–15)
BUN: 21 mg/dL — ABNORMAL HIGH (ref 6–20)
CO2: 25 mmol/L (ref 22–32)
Calcium: 9.5 mg/dL (ref 8.9–10.3)
Chloride: 106 mmol/L (ref 98–111)
Creatinine, Ser: 0.87 mg/dL (ref 0.61–1.24)
GFR, Estimated: >60 mL/min (ref >60)
Glucose, Bld: 111 mg/dL — ABNORMAL HIGH (ref 70–99)
Potassium: 4.3 mmol/L (ref 3.5–5.1)
Sodium: 142 mmol/L (ref 135–145)

## 2024-08-26 LAB — MAGNESIUM: Magnesium: 2.2 mg/dL (ref 1.7–2.4)

## 2024-08-26 LAB — PHOSPHORUS: Phosphorus: 3.9 mg/dL (ref 2.5–4.6)

## 2024-08-26 MED ORDER — LORAZEPAM 2 MG/ML IJ SOLN
1.0000 mg | Freq: Once | INTRAMUSCULAR | Status: AC
  Administered 2024-08-26: 1 mg via INTRAVENOUS
  Filled 2024-08-26: qty 1

## 2024-08-26 MED ORDER — PROPRANOLOL HCL 40 MG PO TABS
40.0000 mg | ORAL_TABLET | Freq: Three times a day (TID) | ORAL | Status: DC
  Administered 2024-08-26 – 2024-08-27 (×4): 40 mg via ORAL
  Filled 2024-08-26 (×4): qty 1

## 2024-08-26 MED ORDER — BENZTROPINE MESYLATE 1 MG PO TABS
1.0000 mg | ORAL_TABLET | Freq: Two times a day (BID) | ORAL | Status: DC
  Administered 2024-08-26 – 2024-08-28 (×5): 1 mg via ORAL
  Filled 2024-08-26 (×6): qty 1

## 2024-08-26 NOTE — Plan of Care (Signed)
   Problem: Nutrition: Goal: Adequate nutrition will be maintained Outcome: Progressing   Problem: Elimination: Goal: Will not experience complications related to bowel motility Outcome: Progressing Goal: Will not experience complications related to urinary retention Outcome: Progressing   Problem: Skin Integrity: Goal: Risk for impaired skin integrity will decrease Outcome: Progressing

## 2024-08-26 NOTE — Consult Note (Signed)
 [facility] Psychiatric Consult Follow-up  Patient Name: .Ernest Romero  MRN: [id number]  DOB: April 11, 1966  Consult Order details:  Orders (From admission, onward)     Start     Ordered   08/22/24 0842  IP CONSULT TO PSYCHIATRY       Ordering Provider: Verdene Purchase, MD  Provider:  (Not yet assigned)  Question Answer Comment  Location [facility]   Reason for Consult? patient with h/o bipolar d/o. was on lithium . here with subdural hematoma and MRSA. improving. some facial trmors. Need help with medication management. he was on lithium , lurasidone . requiring haldol  prn.  thanks      08/22/24 0842   08/02/24 1712  IP CONSULT TO PSYCHIATRY       Ordering Provider: Adolph Tinnie FORBES, PA-C  Provider:  (Not yet assigned)  Question Answer Comment  Location [facility]   Reason for Consult? bipolar      08/02/24 1711   07/24/24 1656  IP CONSULT TO PSYCHIATRY       Ordering Provider: Patsy Lenis, MD  Provider:  (Not yet assigned)  Question Answer Comment  Location [facility]   Reason for Consult? needing formal eval for capacity; may need guardianship given no family/support for discharge      07/24/24 1656   07/16/24 1412  IP CONSULT TO PSYCHIATRY       Ordering Provider: Dino Antu, MD  Provider:  (Not yet assigned)  Question Answer Comment  Location [facility]   Reason for Consult? 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  MDD, recurrent, severe, without psychotic  features 3.  Stimulant use disorder, in controlled setting Rule out traumatic brain injury from chronic subdural hematomas and neurosurgical procedures and complications Rule out bipolar disorder but likely previous manic episodes were meth induced per chart review  Assessment  Ernest Romero is a 58 y.o. male admitted: Medicallyfor 07/15/2024 11:15 PM for recurrent subdural hematoma. He carries the psychiatric diagnoses of bipolar disorder and alcohol use disorder and has a past medical history of subdural hematoma status postcraniotomy.   Original concerns for akathisia versus parkinsonism and was trialed on Ativan  with some improvement, propranolol  with unclear improvement, benztropine  with unclear improvement.  Latuda  was discontinued after discussion with patient and given concern for EPS.  On further history there is low suspicion that patient has true bipolar disorder and more likely has been misdiagnosed due to stimulant intoxication on all previous episodes based on UDS.   Unfortunately there was only transient improvement in his symptoms but now is back and he appears to be quite bothered by it.  Reevaluated the tremor.  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  Symptoms are not painful.  This is a classical description of a parkinsonian resting jaw tremor contributed by Haldol  and Latuda .  He is already on propranolol  and Klonopin .  Given the propranolol  has been uptitrated by  the primary team given that patient has hypertension regardless we will continue his medication.  Given continued concerns for parkinsonism we will trial patient on benztropine  oral and assess response tomorrow.  Of note these symptoms can last for up to 7 days.  Lower suspicion for neurocognitive disorders including Wilson disease or Parkinson's disease given the acute nature.  Lower suspicion for stroke  given bilateral nature.  Lower suspicion for NMS given the lack of fever, rigidity, autonomic stability.   Diagnoses:  Active Hospital problems: Principal Problem:   Subdural hematoma (HCC) Active Problems:   Bipolar disorder (HCC)   Essential hypertension   Alcohol withdrawal (HCC)   Unable to make decisions about medical treatment due to impaired mental capacity   Protein-calorie malnutrition, severe   Acute metabolic encephalopathy   Infection of craniotomy plate    Plan   ## Psychiatric Medication Recommendations:  Continue mirtazapine  15 mg once daily at bedtime for akathisia, insomnia, mood; monitor for mania switching but has lower risk of manic switch than other antidepressant if true bipolar Continue propranolol  as can be helpful for akithisia if present, can help anxiety, and primary team can titrate for HTN Start benztropine  1 mg twice daily for parkinsonism Reasonable to continue clonazepam  in the setting of current akathesia but could consider titrating off this medication in the future especially if having delirium  Consider pregabalin for TBI / anxiety in future Resolutions of symptoms can take up to 7 days so we will continue to monitor.  ## Medical Decision Making Capacity: Not specifically addressed in this encounter  ## Further Work-up:  -- TSH 1 month ago 0.775 While pt on Qtc prolonging medications, please monitor & replete K+ to 4 and Mg2+ to 2 -- most recent EKG on 11/19 had QtC of 414 -- Pertinent labwork reviewed earlier this admission includes: GFR greater than 60, LFT relatively within normal limits mildly elevated ALT, hemoglobin 11-12   ## Disposition:-- need to treat akithesia prior to discharge given substant distress and higher risk for suicide attempt with adra  ## Behavioral / Environmental: -Delirium Precautions: Delirium Interventions for Nursing and Staff: - RN to open blinds every AM. - To Bedside: Glasses, hearing aide, and pt's own  shoes. Make available to patients. when possible and encourage use. - Encourage po fluids when appropriate, keep fluids within reach. - OOB to chair with meals. - Passive ROM exercises to all extremities with AM & PM care. - RN to assess orientation to person, time and place QAM and PRN. - Recommend extended visitation hours with familiar family/friends as feasible. - Staff to minimize disturbances at night. Turn off television when pt asleep or when not in use.    ## Safety and Observation Level:  - Based on my clinical evaluation, I estimate the patient to be at low risk of self harm in the current setting. - At this time, we recommend  routine. This decision is based on my review of the chart including patient's history and current presentation, interview of the patient, mental status examination, and consideration of suicide risk including evaluating suicidal ideation, plan, intent, suicidal or self-harm behaviors, risk factors, and protective factors. This judgment is based on our ability to directly address suicide risk, implement suicide prevention strategies, and develop a safety plan while the patient is in the clinical setting. Please contact our team if there is a concern that risk level has changed.  CSSR Risk Category:C-SSRS RISK CATEGORY: No Risk  Suicide Risk Assessment: Patient has following  modifiable risk factors for suicide: untreated depression, which we are addressing by medication adjustments. Patient has following non-modifiable or demographic risk factors for suicide: Male and psychiatric hospitalization Patient has the following protective factors against suicide: n/a  Thank you for this consult request. Recommendations have been communicated to the primary team.  We will continue to follow at this time.   Justino Cornish, MD       History of Present Illness  Relevant Aspects of [facility] Course per hospitalist:  PCCM Xfer 29/44. 57 year old male, homeless,  medical history significant for alcohol use disorder, bipolar and panic disorder, initially presented to [facility] from jail in September 2025 with a large subdural hematoma.  Underwent craniotomy with flap replacement on 10/27.  Since then multiple OR visits including 10/30 for SD[facility] evacuation, follow-up CT head 11/8 showed fluid collection underneath the bone flap, back to OR on 11/10 and the bone flap was removed, cultures were obtained, intraoperative cultures grew MRSA, treating for infected bone flap, ID on board and currently on IV vancomycin . Refer to PCCM progress note from 11/11 for detailed tabulation of hospital events until then.  Patient Report:  Patient continues to report that he is experiencing severe anxiety and the movements in his jaws which are causing them to be sore now.  Reports that he does not have any history of this again today.  Reports that he has had some intermittent relief but that he says that the medications are not helping.  He is asking for something to help.  Reports that they are severe and he needs them to go away.  Primary team increased dose of propranolol  to treat hypertension given it was started for anxiety and possible akathisia and patient reported that he had a benefit from this medication but then reports that it was not helpful.  Reports that he did not find the Ativan  helpful when receiving it.  Patient was agreeable to trialing benztropine  again but oral this time.   Psych ROS:  Depression: Reports he has been having depressed mood, anhedonia, worthlessness, at lowest in his life, but denies suicidal thoughts but does have morbid rumination Anxiety: Reports that he is an anxious person but that the anxiety has been much worse since being the hospital.  Says that he feels restless and is having issues sleeping because of it.  Reports that he is anxious about his situation where he will go from here. Mania (lifetime and current): Endorses having manic episodes  but when asking about these episodes he does not describe any increased goal-directed activity, denies any decreased need for sleep, but does endorse feeling better, talking more, and impulsivity.  Denies having any dysfunction during these times.  Denies having any distractibility, grandiosity, flight of ideas, risky behaviors. Psychosis: (lifetime and current): Denies AVH or paranoia. Sleep: Reports that he cannot sleep with the shaking and in general   Psychiatric and Social History  Psychiatric History:  Information collected from patient and chart review  Prev Dx/Sx: Bipolar disorder, methamphetamine use, substance-induced mood disorder, alcohol use disorder Current Psych Provider: None Home Meds (current): Restarted on Latuda  40 and titrated to 80 this hospitalization, was not taking anything prior to admission in the short period that  He was away from hospital.  Also restarted on Klonopin  Previous Med Trials: Lithium , Klonopin , Latuda , Haldol  as needed for agitation Therapy: None  Prior Psych Hospitalization: Previous hospitalizations although the psychiatric hospitalizations are not documented but the emergency department visits prior to have been documented and  include episodes documented as mania but are occurring in the presence of positive amphetamine Prior Self Harm: Denies Prior Violence: Denies  Family Psych History: Denies Family Hx suicide: Denies  Social History:  Reports that he has been experiencing homelessness.  Does not report any recent incarceration which appears to conflict with previous chart review.  Does report he has a upcoming court date on December 7.  Denies access to weapons.  Reports that his hobby is making rock music.  Reports that he has a friend who is his international aid/development worker. Access to weapons/lethal means: Denies  Substance History Denies using any substances recently.  Has been in controlled setting.  Does have history of using stimulants but he  denies when asked during interview.  Exam Findings  Physical Exam:  Vital Signs:  Temp:  [97.5 F (36.4 C)-98 F (36.7 C)] 97.9 F (36.6 C) (11/24 1630) Pulse Rate:  [56-69] 63 (11/24 1630) Resp:  [18-20] 20 (11/24 0749) BP: (106-121)/(79-88) 115/84 (11/24 1630) SpO2:  [98 %-100 %] 98 % (11/24 1545) Weight:  [66 kg] 66 kg (11/24 0500) Blood pressure 115/84, pulse 63, temperature 97.9 F (36.6 C), resp. rate 20, height 5' 10 (1.778 m), weight 66 kg, SpO2 98%. Body mass index is 20.88 kg/m.  Physical Exam Constitutional:      Comments: Chronic ill appearing  Pulmonary:     Effort: Pulmonary effort is normal.  Neurological:     Comments: No pain or stiffness with passive ROM of bilateral upper extremities.       Mental Status Exam: General Appearance: Patient is present in his room sitting in bed unable to move himself very well, blinds are closed, laying in clean gown, neurosurgical scar on head,  Orientation: Oriented to person place but not time  Memory: Did not formally assess  Concentration: Did not formally assess but grossly intact  Recall: Grossly intact but did not formally assess  Attention grossly intact but not formally assessed  Eye Contact: Good  Speech: Fair, dysarthric  Language: Fair  Volume: Normal  Mood: Lowest point my life  Affect: Depressed, flat, anxious  Thought Process: Coherent linear  Thought Content: Logical.  Morbid ruminations.  Worried about current situation and future.  Suicidal Thoughts:  No  Homicidal Thoughts:  No  Judgement:  Fair  Insight:  Fair  Psychomotor Activity:  Increased, dyskinetic movements of mouth and hands.  With mouth movement is sliding jaw back and forth.  There is some voluntary inhibition but is overall involuntary when he is not overriding.   Akathisia:  Yes  Fund of Knowledge:  Fair      Assets:  Desire for Improvement  Cognition: Did not formally assess  ADL's: Impaired by patient's movement limitations  but is improving  AIMS (if indicated):  0     Other History   These have been pulled in through the EMR, reviewed, and updated if appropriate.  Family History:  The patient's family history is not on file.  Medical History: Past Medical History:  Diagnosis Date   Anxiety    Bipolar 1 disorder (HCC)    Depression    Hepatitis    Hypertension    MRSA (methicillin resistant Staphylococcus aureus)    5/25 arm abscess and 11/25    Surgical History: Past Surgical History:  Procedure Laterality Date   BURR HOLE N/A 08/01/2024   Procedure: CREATION, CRANIAL BURR HOLE WITH EVACUATION OF HEMATOMA;  Surgeon: Rosslyn Dino HERO, MD;  Location: [facility] OR;  Service:  Neurosurgery;  Laterality: N/A;   BURR HOLE N/A 08/12/2024   Procedure: left bone flap removal;  Surgeon: Rosslyn Dino HERO, MD;  Location: [facility] OR;  Service: Neurosurgery;  Laterality: N/A;   CRANIOPLASTY, WITH BONE FLAP REPLACEMENT Left 07/29/2024   Procedure: CRANIOPLASTY, WITH BONE FLAP REPLACEMENT;  Surgeon: Rosslyn Dino HERO, MD;  Location: [facility] OR;  Service: Neurosurgery;  Laterality: Left;  LEFT CRANIOPLASTY WITH ARTIFICAL BONE FLAP REPLACEMENT   CRANIOTOMY Left 06/30/2024   Procedure: CRANIOTOMY HEMATOMA EVACUATION SUBDURAL;  Surgeon: Melonie Grass, MD;  Location: [facility] ORS;  Service: Neurosurgery;  Laterality: Left;     Medications:   Current Facility-Administered Medications:    acetaminophen  (TYLENOL ) tablet 650 mg, 650 mg, Oral, Q4H PRN, 650 mg at 08/25/24 0403 **OR** [PENDING] acetaminophen  (TYLENOL ) tablet 650 mg, 650 mg, Per Tube, Q4H PRN **OR** acetaminophen  (TYLENOL ) suppository 650 mg, 650 mg, Rectal, Q4H PRN **OR** [PENDING] acetaminophen  (TYLENOL ) suppository 650 mg, 650 mg, Rectal, Q4H PRN,    Chlorhexidine  Gluconate Cloth 2 % PADS 6 each, 6 each, Topical, Daily, Franky Redia SAILOR, MD, 6 each at 08/24/24 9143   clonazePAM  (KLONOPIN ) tablet 0.5 mg, 0.5 mg, Oral, BID, Krishnan, Gokul, MD, 0.5 mg at 08/26/24  0908   docusate (COLACE) 50 MG/5ML liquid 100 mg, 100 mg, Oral, Daily, Hongalgi, Anand D, MD, 100 mg at 08/26/24 0907   docusate sodium  (COLACE) capsule 100 mg, 100 mg, Oral, BID PRN, Autry, Lauren E, PA-C, 100 mg at 08/25/24 0847   feeding supplement (ENSURE PLUS HIGH PROTEIN) liquid 237 mL, 237 mL, Oral, BID BM, Danford, Lonni SQUIBB, MD, 237 mL at 08/26/24 1412   folic acid  (FOLVITE ) tablet 1 mg, 1 mg, Oral, Daily, Rosario Eland I, MD, 1 mg at 08/26/24 9089   gabapentin  (NEURONTIN ) capsule 200 mg, 200 mg, Oral, TID, Danford, Lonni SQUIBB, MD, 200 mg at 08/26/24 1546   heparin  injection 5,000 Units, 5,000 Units, Subcutaneous, Q8H, Desai, Rahul P, PA-C, 5,000 Units at 08/26/24 1412   labetalol  (NORMODYNE ) injection 10-40 mg, 10-40 mg, Intravenous, Q10 min PRN, Janjua, Rashid M, MD   LORazepam  (ATIVAN ) injection 0.5 mg, 0.5 mg, Intravenous, Q6H PRN, Krishnan, Gokul, MD, 0.5 mg at 08/26/24 1217   melatonin tablet 10 mg, 10 mg, Oral, QHS PRN, Shalhoub, George J, MD, 10 mg at 08/24/24 2119   multivitamin with minerals tablet 1 tablet, 1 tablet, Oral, Daily, Rosario Eland I, MD, 1 tablet at 08/26/24 0908   nicotine  (NICODERM CQ  - dosed in mg/24 hours) patch 14 mg, 14 mg, Transdermal, Daily, Fredia Dorothe HERO, MD, 14 mg at 08/26/24 9091   ondansetron  (ZOFRAN ) tablet 4 mg, 4 mg, Oral, Q4H PRN **OR** ondansetron  (ZOFRAN ) injection 4 mg, 4 mg, Intravenous, Q4H PRN, Janjua, Rashid M, MD, 4 mg at 08/01/24 1506   Oral care mouth rinse, 15 mL, Mouth Rinse, PRN, Mannam, Praveen, MD   pantoprazole  (PROTONIX ) EC tablet 40 mg, 40 mg, Oral, QHS, Hongalgi, Anand D, MD, 40 mg at 08/25/24 2157   polyethylene glycol (MIRALAX  / GLYCOLAX ) packet 17 g, 17 g, Oral, Daily, Desai, Rahul P, PA-C, 17 g at 08/26/24 9092   promethazine  (PHENERGAN ) tablet 12.5-25 mg, 12.5-25 mg, Oral, Q4H PRN, Janjua, Rashid M, MD   propranolol  (INDERAL ) tablet 40 mg, 40 mg, Oral, TID, Krishnan, Gokul, MD, 40 mg at 08/26/24 1546   senna  (SENOKOT) tablet 17.2 mg, 2 tablet, Oral, Daily, Desai, Rahul P, PA-C, 17.2 mg at 08/26/24 0910   sodium chloride  flush (NS) 0.9 % injection 10-40 mL, 10-40  mL, Intracatheter, Q12H, Franky Redia SAILOR, MD, 10 mL at 08/25/24 2158   sodium chloride  flush (NS) 0.9 % injection 10-40 mL, 10-40 mL, Intracatheter, PRN, Franky Redia SAILOR, MD, 30 mL at 07/29/24 1135   thiamine  (VITAMIN B1) tablet 100 mg, 100 mg, Oral, Daily, Hongalgi, Anand D, MD, 100 mg at 08/26/24 9089   vancomycin  (VANCOCIN ) IVPB 1000 mg/200 mL premix, 1,000 mg, Intravenous, Q12H, Jimenez, Aileen, [facility], Last Rate: 200 mL/hr at 08/26/24 0913, 1,000 mg at 08/26/24 9086  Allergies: No Known Allergies  Justino Cornish, MD

## 2024-08-26 NOTE — Progress Notes (Signed)
 PROGRESS NOTE   Ernest Romero  FMW:[id number]    DOB: [birth year]    DOA: 07/15/2024  PCP: [name] Course:  PCCM Xfer 51/22. 58 year old male, homeless, medical history significant for alcohol use disorder, bipolar and panic disorder, initially presented to [facility] jail in September 2025 with a large subdural hematoma.  Underwent craniotomy with flap replacement on 10/27.  Since then multiple OR visits including 10/30 for SDH evacuation, follow-up CT head 11/8 showed fluid collection underneath the bone flap, back to OR on 11/10 and the bone flap was removed, cultures were obtained, intraoperative cultures grew MRSA, treating for infected bone flap, ID on board and currently on IV vancomycin . Refer to PCCM progress note from 11/11 for detailed tabulation of hospital events until then.   Assessment & Plan:   Subdural hematoma  S/P initial craniotomy 9/28 (Dr. Loa poor barr)  S/P skull flap left cranioplasty 10/27 (Dr. Rosslyn).  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 Patient being followed periodically by neurosurgery, Dr. Rosslyn. Patient was seen by infectious disease.  Patient remains on vancomycin .  End date for IV antibiotics is 09/23/2024.  After he has completed course of IV antibiotics patient to be treated with doxycycline  for 8 weeks. PICC line to be placed closer to discharge. Patient remains stable from neurological standpoint. Patient has completed 1 week of Keppra . Still has sutures.  Surgery was on 11/10.  Dr. Janjua mentions that sutures should come out 3 weeks postoperatively.  So sutures should come out on December 1.   Therapies recommend SNF.  TOC is following.     Acute metabolic encephalopathy At baseline, patient reportedly without cognitive impairment, was homeless and independent caring for self. Per prior review of Dr. Rosario, [facility] MD note from 11/10, patient unable to make decisions  about medical treatment due to impaired capacity, secondary to neurosurgical issues noted above and multifactorial. Also per Dr. Donn note from 7/10, given his current deficits in planning/executive function/memory, patient is likely to need guardianship for decisions i.e. placement. Patient's support counselor Larnell [name] has been visiting intermittently. [phone]).   Per information available to medical team, he does not have kids, his spouse passed away. Has sister, estranged.  Patient mentation has significantly improved in the last few days.  He seems to be close to his baseline. He is able to think objectively and is more aware of his medical issues.  Seems to have better judgment now.  Should be able to decide for himself going forward.  Bipolar disorder/akathisia Patient with longstanding history of bipolar disorder.  Was on lithium  previously but has not been on it since 10/27.  Patient was requiring Haldol  and Latuda  dose was increased. However subsequently patient was noted to have facial tremors 11/20.  Concern for tardive dyskinesia.  Psychiatry was consulted to assist with management.  Haldol  and Latuda  were discontinued.   EEG does not show any seizure activity. Patient was started on propranolol  and Ativan  as needed.  Clonazepam  was continued. Facial tremors persist.  Concern for drug-induced parkinsonism.  Dose of propranolol  has been increased.  Psychiatry continues to follow.  Offending medications have been discontinued.   History of alcohol, tobacco use disorder Continue thiamine , folic acid , multivitamin  Treated for alcohol withdrawal delirium with phenobarbital  taper at [facility].  Alcohol withdrawal resolved.   Essential HTN With initiation of propranolol  for his tremors his blood pressure was noted to be low.  Amlodipine   and losartan  were discontinued.  Continue to monitor blood pressures closely.    Constipation Continue bowel regimen.  Nutrition  Status: Nutrition Problem: Severe Malnutrition Etiology: acute illness Signs/Symptoms: energy intake < or equal to 50% for > or equal to 5 days, severe muscle depletion, moderate fat depletion Interventions: Tube feeding Agree with dietitian evaluation and management.  Body mass index is 20.88 kg/m.   DVT prophylaxis: Subcutaneous heparin    Code Status: Full Code:  Family Communication: None at bedside. Disposition: SNF when medically stable.  TOC is following closely.  They have been communicating with patient's support counselor.  Support counselor is trying to arrange an apartment for him.   Consultants:   PCCM Neurology Neurosurgery Psychiatry Infectious disease  Procedures:   As noted above  Subjective:  Continues to be bothered by facial tremors.  No other complaints offered.  Objective:   Vitals:   08/25/24 1632 08/25/24 2007 08/26/24 0500 08/26/24 0749  BP: (!) 117/98 106/79  121/84  Pulse: 63 (!) 56  62  Resp: 16 18  20   Temp: 97.9 F (36.6 C) 98 F (36.7 C)  97.7 F (36.5 C)  TempSrc: Oral   Oral  SpO2: 97% 98%  100%  Weight:   66 kg   Height:        General appearance: Awake alert.  In no distress Resp: Clear to auscultation bilaterally.  Normal effort Cardio: S1-S2 is normal regular.  No S3-S4.  No rubs murmurs or bruit GI: Abdomen is soft.  Nontender nondistended.  Bowel sounds are present normal.  No masses organomegaly Extremities: Open all of his extremities.  No edema. Facial tremors noted.  Data Reviewed:     CBC: Recent Labs  Lab 08/23/24 0134  WBC 7.6  HGB 12.4*  HCT 38.1*  MCV 89.9  PLT 334    Basic Metabolic Panel: Recent Labs  Lab 08/21/24 0351 08/23/24 0134 08/25/24 1235 08/26/24 0547  NA 139 137 140 142  K 4.5 4.0 4.1 4.3  CL 105 101 104 106  CO2 22 27 24 25   GLUCOSE 98 98 99 111*  BUN 21* 22* 24* 21*  CREATININE 0.94 0.83 0.93 0.87  CALCIUM 9.5 9.4 9.3 9.5  MG  --  2.2  --  2.2  PHOS  --   --   --  3.9      Microbiology Studies:   No results found for this or any previous visit (from the past 240 hours).   Radiology Studies:  No results found.   Scheduled Meds:    Chlorhexidine  Gluconate Cloth  6 each Topical Daily   clonazePAM   0.5 mg Oral BID   docusate  100 mg Oral Daily   feeding supplement  237 mL Oral BID BM   folic acid   1 mg Oral Daily   gabapentin   200 mg Oral TID   heparin  injection (subcutaneous)  5,000 Units Subcutaneous Q8H   multivitamin with minerals  1 tablet Oral Daily   nicotine   14 mg Transdermal Daily   pantoprazole   40 mg Oral QHS   polyethylene glycol  17 g Oral Daily   propranolol   40 mg Oral TID   senna  2 tablet Oral Daily   sodium chloride  flush  10-40 mL Intracatheter Q12H   thiamine   100 mg Oral Daily    Continuous Infusions:    vancomycin  1,000 mg (08/25/24 2101)     LOS: 42 days     Joette Pebbles, MD,    To  contact the attending provider between 7A-7P or the covering provider during after hours 7P-7A, please log into the web site [email or web address] and access using universal [place] password for that web site. If you do not have the password, please call the hospital operator.  08/26/2024, 8:59 AM

## 2024-08-26 NOTE — Progress Notes (Signed)
 Patient has become increasingly restless, pacing back forth unable to be still. Notified provider 1mg  ativan  ordered and administered.

## 2024-08-26 NOTE — Progress Notes (Signed)
 Physical Therapy Treatment Patient Details Name: Ernest Romero MRN: [id number] DOB: 04/14/1966 Today's Date: 08/26/2024   History of Present Illness Pt is a 58 y.o. male presenting 10/14 from [facility] where he was admitted 9/28 for unresponsive episode in the jail. 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 On CIWA protocol. Transferred to [facility] for further management of craniotomy and flap. 10/27 s/p cranioplasty with bone flap replacement. 10/28 Change in status thought to be related to Klonopin  administration 10/28, repeat CTH shows significant increase in size of mixed attenuation subdural fluid collection underlying the L tempoparietal craniotomy site, now measuring approximately 15 mm in thickness, with associated mild mass effect and slight rightward midline shift. OR on 10/30 for SDH evacuation. [facility] 11/7 with fluid collected under bone flap. 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 PMH alcohol use disorder, psychiatric disorder, HTN    PT Comments  Pt initially hesitant to participate in therapy today, reporting increased tremors and his concern. Pt agreeable to bed level exercises and then with encouragement for ambulation. Pt reports not wanting to go out in the hallway. Pt requiring supervision for bed mobility and min A for transfers and ambulation in room. D/c plans remain appropriate at this time. PT will continue to follow acutely.      If plan is discharge home, recommend the following: A lot of help with bathing/dressing/bathroom;Assistance with cooking/housework;Direct supervision/assist for medications management;Direct supervision/assist for financial management;Assist for transportation;Help with stairs or ramp for entrance;Supervision due to cognitive status;A little help with walking and/or transfers   Can travel by private vehicle     Yes  Equipment Recommendations  Wheelchair  (measurements PT);Wheelchair cushion (measurements PT);Rolling walker (2 wheels)    Recommendations for Other Services       Precautions / Restrictions Precautions Precautions: Fall Recall of Precautions/Restrictions: Impaired Precaution/Restrictions Comments: SBP <160, helmet (pt to wear when OOB per RN) Restrictions Weight Bearing Restrictions Per Provider Order: No     Mobility  Bed Mobility Overal bed mobility: Needs Assistance Bed Mobility: Supine to Sit, Sit to Supine     Supine to sit: Supervision Sit to supine: Supervision        Transfers Overall transfer level: Needs assistance Equipment used: 1 person hand held assist Transfers: Sit to/from Stand Sit to Stand: Min assist           General transfer comment: min A for steadying    Ambulation/Gait Ambulation/Gait assistance: Min assist Gait Distance (Feet): 40 Feet Assistive device: 1 person hand held assist Gait Pattern/deviations: Step-through pattern, Decreased stride length, Narrow base of support, Shuffle Gait velocity: decr     General Gait Details: pt very tentative, tremors less with walking, however pt reports not wanting to go out of the room and asks to return to bed after walking to door and back x2         Balance Overall balance assessment: Needs assistance Sitting-balance support: Feet supported, Bilateral upper extremity supported Sitting balance-Leahy Scale: Fair     Standing balance support: Bilateral upper extremity supported, During functional activity, No upper extremity supported Standing balance-Leahy Scale: Poor Standing balance comment: posterior bias                            Communication Communication Communication: Impaired Factors Affecting Communication: Difficulty expressing self (soft spoken)  Cognition Arousal: Alert Behavior During Therapy:  Flat affect   PT - Cognitive impairments: Memory                   Rancho Levels of Cognitive  Functioning Rancho Los Amigos Scales of Cognitive Functioning: Automatic, Appropriate: Minimal Assistance for Daily Living Skills Rancho Los Amigos Scales of Cognitive Functioning: Automatic, Appropriate: Minimal Assistance for Daily Living Skills [VII]   Following commands: Impaired Following commands impaired: Follows one step commands with increased time, Follows multi-step commands inconsistently    Cueing Cueing Techniques: Verbal cues  Exercises General Exercises - Lower Extremity Ankle Circles/Pumps: AROM, Both, 10 reps, Supine Heel Slides: AROM, Both, 10 reps, Supine Straight Leg Raises: AROM, Both, 10 reps, Supine    General Comments General comments (skin integrity, edema, etc.): pt reports increased tremors and that it really bothers him especially the one in his jaw as his jaw is starting to hurt      Pertinent Vitals/Pain Pain Assessment Pain Assessment: No/denies pain     PT Goals (current goals can now be found in the care plan section) Acute Rehab PT Goals PT Goal Formulation: With patient Time For Goal Achievement: 09/06/24 Potential to Achieve Goals: Fair Progress towards PT goals: Not progressing toward goals - comment    Frequency    Min 2X/week       AM-PAC PT 6 Clicks Mobility   Outcome Measure  Help needed turning from your back to your side while in a flat bed without using bedrails?: A Little Help needed moving from lying on your back to sitting on the side of a flat bed without using bedrails?: A Little Help needed moving to and from a bed to a chair (including a wheelchair)?: A Little Help needed standing up from a chair using your arms (e.g., wheelchair or bedside chair)?: A Little Help needed to walk in hospital room?: A Little Help needed climbing 3-5 steps with a railing? : A Little 6 Click Score: 18    End of Session Equipment Utilized During Treatment: Gait belt (helmet) Activity Tolerance: Patient limited by fatigue;Other  (comment) (tremors) Patient left: in bed;with call bell/phone within reach;with bed alarm set Nurse Communication: Mobility status PT Visit Diagnosis: Other symptoms and signs involving the nervous system (R29.898);Muscle weakness (generalized) (M62.81)     Time: 1520-1540 PT Time Calculation (min) (ACUTE ONLY): 20 min  Charges:    $Therapeutic Exercise: 8-22 mins PT General Charges $$ ACUTE PT VISIT: 1 Visit                     Yadir Zentner B. Fleeta Lapidus PT, DPT Acute Rehabilitation Services Please use secure chat or  Call Office [phone]    [name] [facility] 08/26/2024, 6:48 PM

## 2024-08-26 NOTE — [place] FL2 (Signed)
 [name]  MEDICAID FL2 LEVEL OF CARE FORM     IDENTIFICATION  Patient Name: Ernest Romero Birthdate: 05/26/1966 Sex: male Admission Date (Current Location): 07/15/2024  [name]:  [name] [id number] T Facility and Address:  [facility], [address], [place], [place] [address]      Provider Number: [id number]  Attending Physician Name and Address:  Verdene Purchase, MD  Relative Name and Phone Number:  [name], friend - [phone]    Current Level of Care: SNF Recommended Level of Care: Skilled Nursing Facility Prior Approval Number:    Date Approved/Denied:   PASRR Number:    Discharge Plan: SNF    Current Diagnoses: Patient Active Problem List   Diagnosis Date Noted   Infection of craniotomy plate 88/89/7974   Acute metabolic encephalopathy 08/05/2024   Protein-calorie malnutrition, severe 07/31/2024   Unable to make decisions about medical treatment due to impaired mental capacity 07/25/2024   Alcohol withdrawal (HCC) 07/02/2024   Hypophosphatemia 07/02/2024   Subdural hematoma (HCC) 06/30/2024   Behavior concern in adult 04/21/2024   Drug-seeking behavior 03/02/2024   Polysubstance abuse (HCC) 03/02/2024   Delusions (HCC) 04/15/2023   Cocaine abuse with cocaine-induced psychotic disorder, with delusions (HCC) 04/15/2023   Overweight with body mass index (BMI) of 28 to 28.9 in adult 08/30/2022   Essential hypertension 09/17/2018   Bipolar disorder (HCC) 04/22/2013    Orientation RESPIRATION BLADDER Height & Weight     Self, Time, Situation, Place  Normal Continent Weight: 145 lb 8.1 oz (66 kg) Height:  5' 10 (177.8 cm)  BEHAVIORAL SYMPTOMS/MOOD NEUROLOGICAL BOWEL NUTRITION STATUS      Continent Diet (Please refer to DC summary)  AMBULATORY STATUS COMMUNICATION OF NEEDS Skin   Limited Assist Verbally Other (Comment) (Closed surgical incision on left side of head)                       Personal Care  Assistance Level of Assistance  Bathing, Feeding, Dressing Bathing Assistance: Limited assistance Feeding assistance: Independent Dressing Assistance: Limited assistance     Functional Limitations Info  Sight, Hearing, Speech Sight Info: Adequate Hearing Info: Adequate Speech Info: Adequate    SPECIAL CARE FACTORS FREQUENCY  PT (By licensed PT), OT (By licensed OT)     PT Frequency: 5x/week OT Frequency: 5x/week            Contractures Contractures Info: Not present    Additional Factors Info  Code Status, Allergies Code Status Info: full code Allergies Info: NKA Psychotropic Info: Haldol          Current Medications (08/26/2024):  This is the current hospital active medication list Current Facility-Administered Medications  Medication Dose Route Frequency Provider Last Rate Last Admin   acetaminophen  (TYLENOL ) tablet 650 mg  650 mg Oral Q4H PRN Hongalgi, Anand D, MD   650 mg at 08/25/24 0403   Or   acetaminophen  (TYLENOL ) suppository 650 mg  650 mg Rectal Q4H PRN Hongalgi, Anand D, MD       Chlorhexidine  Gluconate Cloth 2 % PADS 6 each  6 each Topical Daily Franky Redia SAILOR, MD   6 each at 08/24/24 0856   clonazePAM  (KLONOPIN ) tablet 0.5 mg  0.5 mg Oral BID Krishnan, Gokul, MD   0.5 mg at 08/26/24 0908   docusate (COLACE) 50 MG/5ML liquid 100 mg  100 mg Oral Daily Hongalgi, Anand D, MD   100 mg at 08/26/24 9092   docusate sodium  (  COLACE) capsule 100 mg  100 mg Oral BID PRN Autry, Lauren E, PA-C   100 mg at 08/25/24 0847   feeding supplement (ENSURE PLUS HIGH PROTEIN) liquid 237 mL  237 mL Oral BID BM Danford, Lonni SQUIBB, MD   237 mL at 08/26/24 1412   folic acid  (FOLVITE ) tablet 1 mg  1 mg Oral Daily Ogbata, Sylvester I, MD   1 mg at 08/26/24 9089   gabapentin  (NEURONTIN ) capsule 200 mg  200 mg Oral TID Jonel Lonni SQUIBB, MD   200 mg at 08/26/24 1546   heparin  injection 5,000 Units  5,000 Units Subcutaneous Q8H Desai, Rahul P, PA-C   5,000 Units at 08/26/24  1412   labetalol  (NORMODYNE ) injection 10-40 mg  10-40 mg Intravenous Q10 min PRN Janjua, Rashid M, MD       LORazepam  (ATIVAN ) injection 0.5 mg  0.5 mg Intravenous Q6H PRN Krishnan, Gokul, MD   0.5 mg at 08/26/24 1217   melatonin tablet 10 mg  10 mg Oral QHS PRN Shalhoub, George J, MD   10 mg at 08/24/24 2119   multivitamin with minerals tablet 1 tablet  1 tablet Oral Daily Rosario Eland I, MD   1 tablet at 08/26/24 9091   nicotine  (NICODERM CQ  - dosed in mg/24 hours) patch 14 mg  14 mg Transdermal Daily Fredia Dorothe HERO, MD   14 mg at 08/26/24 9091   ondansetron  (ZOFRAN ) tablet 4 mg  4 mg Oral Q4H PRN Janjua, Rashid M, MD       Or   ondansetron  (ZOFRAN ) injection 4 mg  4 mg Intravenous Q4H PRN Janjua, Rashid M, MD   4 mg at 08/01/24 1506   Oral care mouth rinse  15 mL Mouth Rinse PRN Mannam, Praveen, MD       pantoprazole  (PROTONIX ) EC tablet 40 mg  40 mg Oral QHS Hongalgi, Anand D, MD   40 mg at 08/25/24 2157   polyethylene glycol (MIRALAX  / GLYCOLAX ) packet 17 g  17 g Oral Daily Desai, Rahul P, PA-C   17 g at 08/26/24 9092   promethazine  (PHENERGAN ) tablet 12.5-25 mg  12.5-25 mg Oral Q4H PRN Janjua, Rashid M, MD       propranolol  (INDERAL ) tablet 40 mg  40 mg Oral TID Krishnan, Gokul, MD   40 mg at 08/26/24 1546   senna (SENOKOT) tablet 17.2 mg  2 tablet Oral Daily Desai, Rahul P, PA-C   17.2 mg at 08/26/24 0910   sodium chloride  flush (NS) 0.9 % injection 10-40 mL  10-40 mL Intracatheter Q12H Franky Redia SAILOR, MD   10 mL at 08/25/24 2158   sodium chloride  flush (NS) 0.9 % injection 10-40 mL  10-40 mL Intracatheter PRN Franky Redia SAILOR, MD   30 mL at 07/29/24 1135   thiamine  (VITAMIN B1) tablet 100 mg  100 mg Oral Daily Hongalgi, Anand D, MD   100 mg at 08/26/24 0910   vancomycin  (VANCOCIN ) IVPB 1000 mg/200 mL premix  1,000 mg Intravenous Q12H Alveria Modest, RPH 200 mL/hr at 08/26/24 0913 1,000 mg at 08/26/24 9086     Discharge Medications: Please see discharge summary for a  list of discharge medications.  Relevant Imaging Results:  Relevant Lab Results:   Additional Information SSN [id number]  [name], [place]

## 2024-08-26 NOTE — Progress Notes (Signed)
 Occupational Therapy Treatment Patient Details Name: Ernest Romero MRN: [id number] DOB: 1966/06/15 Today's Date: 08/26/2024   History of present illness Pt is a 58 y.o. male presenting 10/14 from [facility] where he was admitted 9/28 [facility] episode in the jail. 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 On CIWA protocol. Transferred to [facility] for further management of craniotomy and flap. 10/27 s/p cranioplasty with bone flap replacement. 10/28 Change in status thought to be related to Klonopin  administration 10/28, repeat CTH shows significant increase in size of mixed attenuation subdural fluid collection underlying the L tempoparietal craniotomy site, now measuring approximately 15 mm in thickness, with associated mild mass effect and slight rightward midline shift. OR on 10/30 for SDH evacuation. [facility] 11/7 with fluid collected under bone flap. 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 PMH alcohol use disorder, psychiatric disorder, HTN   OT comments  Pt progressing slowly toward established OT goals. Performing standing BADL with CGA this session with cues for safety as well as in hall mobility/basic path finding. Needing up to min cues for 2 step path finding tasks. Once re-oriented to room number, able to locate room via room numbers posted on walls. Will continue to follow.       If plan is discharge home, recommend the following:  Supervision due to cognitive status;Direct supervision/assist for medications management;Assistance with cooking/housework;Direct supervision/assist for financial management;Assist for transportation;Assistance with feeding;A little help with walking and/or transfers;Help with stairs or ramp for entrance;A little help with bathing/dressing/bathroom   Equipment Recommendations  BSC/3in1    Recommendations for Other Services      Precautions / Restrictions  Precautions Precautions: Fall Recall of Precautions/Restrictions: Impaired Precaution/Restrictions Comments: SBP <160, helmet (pt to wear when OOB per RN) Restrictions Weight Bearing Restrictions Per Provider Order: No       Mobility Bed Mobility Overal bed mobility: Needs Assistance Bed Mobility: Supine to Sit, Sit to Supine     Supine to sit: Supervision Sit to supine: Supervision        Transfers Overall transfer level: Needs assistance Equipment used: Rolling walker (2 wheels) Transfers: Sit to/from Stand Sit to Stand: Min assist, Contact guard assist           General transfer comment: cues for hand placement with RW use     Balance Overall balance assessment: Needs assistance Sitting-balance support: Feet supported, Bilateral upper extremity supported Sitting balance-Leahy Scale: Fair     Standing balance support: Bilateral upper extremity supported, During functional activity, No upper extremity supported Standing balance-Leahy Scale: Poor Standing balance comment: posterior bias                           ADL either performed or assessed with clinical judgement   ADL Overall ADL's : Needs assistance/impaired     Grooming: Contact guard assist;Wash/dry hands;Standing                 Lower Body Dressing Details (indicate cue type and reason): defers changing socks Toilet Transfer: Contact guard assist;Ambulation;Rolling walker (2 wheels)           Functional mobility during ADLs: Contact guard assist;Rolling walker (2 wheels)      Extremity/Trunk Assessment Upper Extremity Assessment Upper Extremity Assessment: Generalized weakness   Lower Extremity Assessment Lower Extremity Assessment: Defer to PT evaluation        Vision  Perception     Praxis     Communication Communication Communication: Impaired Factors Affecting Communication: Difficulty expressing self (soft spoken)   Cognition Arousal:  Alert Behavior During Therapy: Flat affect Cognition: Cognition impaired     Awareness: Online awareness impaired Memory impairment (select all impairments): Working civil service fast streamer, Short-term memory Attention impairment (select first level of impairment): Divided attention Executive functioning impairment (select all impairments): Organization, Sequencing, Reasoning, Problem solving OT - Cognition Comments: follows up to 2 step simple commands with increased time and up to min cues. pt performing simple 2 step path finding with up to min cues as well. able to locate room via reading room numbers with trail making back to his room               [facility] Scales of Cognitive Functioning: Automatic, Appropriate: Minimal Assistance for Daily Living Skills [VII] Following commands: Impaired Following commands impaired: Follows one step commands with increased time, Follows multi-step commands inconsistently      Cueing   Cueing Techniques: Verbal cues  Exercises      Shoulder Instructions       General Comments      Pertinent Vitals/ Pain       Pain Assessment Pain Assessment: No/denies pain  Home Living                                          Prior Functioning/Environment              Frequency  Min 2X/week        Progress Toward Goals  OT Goals(current goals can now be found in the care plan section)  Progress towards OT goals: Progressing toward goals  Acute Rehab OT Goals OT Goal Formulation: With patient Time For Goal Achievement: 08/28/24 Potential to Achieve Goals: Fair ADL Goals Pt Will Perform Grooming: with supervision;standing Pt Will Perform Upper Body Bathing: with supervision;sitting Pt Will Perform Lower Body Dressing: with min assist;sit to/from stand Pt Will Transfer to Toilet: with supervision;ambulating Pt Will Perform Toileting - Clothing Manipulation and hygiene: with min assist Additional ADL Goal #1: Pt will  complete 2-3 step ADL task with supervision.  Plan      Co-evaluation                 AM-PAC OT 6 Clicks Daily Activity     Outcome Measure   Help from another person eating meals?: A Little Help from another person taking care of personal grooming?: A Little Help from another person toileting, which includes using toliet, bedpan, or urinal?: A Little Help from another person bathing (including washing, rinsing, drying)?: A Little Help from another person to put on and taking off regular upper body clothing?: A Little Help from another person to put on and taking off regular lower body clothing?: A Little 6 Click Score: 18    End of Session Equipment Utilized During Treatment: Gait belt;Rolling walker (2 wheels);Other (comment) (helmet)  OT Visit Diagnosis: Other abnormalities of gait and mobility (R26.89);Muscle weakness (generalized) (M62.81);Other symptoms and signs involving the nervous system (R29.898);Cognitive communication deficit (R41.841);Other symptoms and signs involving cognitive function   Activity Tolerance Patient tolerated treatment well   Patient Left with call bell/phone within reach;in chair;with chair alarm set   Nurse Communication Mobility status;Precautions        Time: 8892-8876 OT Time Calculation (min): 16 min  Charges: OT General Charges $OT Visit: 1 Visit OT Treatments $Therapeutic Activity: 8-22 mins  Elma JONETTA Lebron FREDERICK, OTR/L [facility] Office: [phone]   Elma JONETTA Lebron 08/26/2024, 12:26 PM

## 2024-08-27 MED ORDER — CLONAZEPAM 1 MG PO TABS
1.0000 mg | ORAL_TABLET | Freq: Three times a day (TID) | ORAL | Status: DC
  Administered 2024-08-27 – 2024-08-28 (×4): 1 mg via ORAL
  Filled 2024-08-27 (×4): qty 1

## 2024-08-27 MED ORDER — LORAZEPAM 2 MG/ML IJ SOLN
1.0000 mg | Freq: Once | INTRAMUSCULAR | Status: AC
  Administered 2024-08-27: 1 mg via INTRAVENOUS
  Filled 2024-08-27: qty 1

## 2024-08-27 MED ORDER — PROPRANOLOL HCL 40 MG PO TABS
20.0000 mg | ORAL_TABLET | Freq: Two times a day (BID) | ORAL | Status: AC
  Administered 2024-08-27 – 2024-11-08 (×114): 20 mg via ORAL
  Filled 2024-08-27 (×128): qty 1

## 2024-08-27 MED ORDER — HYDROMORPHONE HCL 1 MG/ML IJ SOLN: 0.5000 mg | Freq: Once | INTRAMUSCULAR | Status: DC

## 2024-08-27 NOTE — Progress Notes (Signed)
 PROGRESS NOTE   Ernest Romero  FMW:[id number]    DOB: 12-13-1965    DOA: 07/15/2024  PCP: [name] Course:  PCCM Xfer 37/57. 58 year old male, homeless, medical history significant for alcohol use disorder, bipolar and panic disorder, initially presented to [facility] from jail in September 2025 with a large subdural hematoma.  Underwent craniotomy with flap replacement on 10/27.  Since then multiple OR visits including 10/30 for SDH evacuation, follow-up CT head 11/8 showed fluid collection underneath the bone flap, back to OR on 11/10 and the bone flap was removed, cultures were obtained, intraoperative cultures grew MRSA, treating for infected bone flap, ID on board and currently on IV vancomycin . Refer to PCCM progress note from 11/11 for detailed tabulation of hospital events until then.   Assessment & Plan:   Subdural hematoma  S/P initial craniotomy 9/28 (Dr. Loa poor barr)  S/P skull flap left cranioplasty 10/27 (Dr. Rosslyn).  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 Patient being followed periodically by neurosurgery, Dr. Rosslyn. Patient was seen by infectious disease.  Patient remains on vancomycin .  End date for IV antibiotics is 09/23/2024.  After he has completed course of IV antibiotics patient to be treated with doxycycline  for 8 weeks. PICC line to be placed closer to discharge. Patient remains stable from neurological standpoint. Patient has completed 1 week of Keppra . Still has sutures.  Surgery was on 11/10.  Dr. Janjua mentions that sutures should come out 3 weeks postoperatively.  So sutures should come out on December 1.   Therapies recommend SNF.  TOC is following.     Acute metabolic encephalopathy At baseline, patient reportedly without cognitive impairment, was homeless and independent caring for self. Per prior review of Dr. Rosario, [facility] MD note from 11/10, patient unable to make decisions  about medical treatment due to impaired capacity, secondary to neurosurgical issues noted above and multifactorial. Also per Dr. Donn note from 7/10, given his current deficits in planning/executive function/memory, patient is likely to need guardianship for decisions i.e. placement. Patient's support counselor Larnell [name] has been visiting intermittently. [phone]).   Per information available to medical team, he does not have kids, his spouse passed away. Has sister, estranged.  Patient mentation has significantly improved in the last few days.  He seems to be close to his baseline. He is able to think objectively and is more aware of his medical issues.  Seems to have better judgment now.  Should be able to decide for himself going forward.  Bipolar disorder/akathisia/drug-induced parkinsonism Patient with longstanding history of bipolar disorder.  Was on lithium  previously but has not been on it since 10/27.  Patient was requiring Haldol  and Latuda  dose was increased. However subsequently patient was noted to have facial tremors 11/20.  Concern for tardive dyskinesia.  Psychiatry was consulted to assist with management.  Haldol  and Latuda  were discontinued.   EEG does not show any seizure activity. Patient was started on propranolol  and Ativan  as needed.  Clonazepam  was continued. Patient with tremors persist.  Concern was for drug-induced parkinsonism.  Dose of propranolol  was increased yesterday.  Seen by psychiatry again they have added benztropine .  Patient mentioned that he is feeling better though he continues to have tremors.   Fending medications have been discontinued.  Psychiatry continues to follow.   History of alcohol, tobacco use disorder Continue thiamine , folic acid , multivitamin  Treated for alcohol withdrawal delirium with phenobarbital  taper  at [facility].  Alcohol withdrawal resolved.   Essential HTN With initiation of propranolol  for his tremors his blood pressure was  noted to be low.  Amlodipine  and losartan  were discontinued.   Blood pressure remains soft.  Now that it appears the tremors are most likely due to drug-induced parkinsonism the dose of propranolol  is not entirely clear.  Will decrease the dose to avoid hypotension.  Constipation Continue bowel regimen.  Nutrition Status: Nutrition Problem: Severe Malnutrition Etiology: acute illness Signs/Symptoms: energy intake < or equal to 50% for > or equal to 5 days, severe muscle depletion, moderate fat depletion Interventions: Tube feeding Agree with dietitian evaluation and management.  Body mass index is 20.88 kg/m.   DVT prophylaxis: Subcutaneous heparin    Code Status: Full Code:  Family Communication: None at bedside. Disposition: SNF when medically stable.  TOC is following closely.  They have been communicating with patient's support counselor.  Support counselor is trying to arrange an apartment for him.  As per psychiatry will need to wait for his facial tremors to improve before he can be safely discharged.   Consultants:   PCCM Neurology Neurosurgery Psychiatry Infectious disease  Procedures:   As noted above  Subjective:  Continues to be bothered by facial tremors.  No other complaints offered.  Objective:   Vitals:   08/26/24 1630 08/26/24 2146 08/26/24 2149 08/27/24 0738  BP: 115/84 98/64 98/64  101/79  Pulse: 63 (!) 53 62 (!) 55  Resp:  18  19  Temp: 97.9 F (36.6 C) 98.4 F (36.9 C)  (!) 97.4 F (36.3 C)  TempSrc:  Oral    SpO2:  99%  100%  Weight:      Height:       General appearance: Awake alert.  In no distress Resp: Clear to auscultation bilaterally.  Normal effort Cardio: S1-S2 is normal regular.  No S3-S4.  No rubs murmurs or bruit GI: Abdomen is soft.  Nontender nondistended.  Bowel sounds are present normal.  No masses organomegaly Extremities: No edema.  Full range of motion of lower extremities. Continues to have facial tremors.  No other  neurological deficits.  Data Reviewed:     CBC: Recent Labs  Lab 08/23/24 0134  WBC 7.6  HGB 12.4*  HCT 38.1*  MCV 89.9  PLT 334    Basic Metabolic Panel: Recent Labs  Lab 08/21/24 0351 08/23/24 0134 08/25/24 1235 08/26/24 0547  NA 139 137 140 142  K 4.5 4.0 4.1 4.3  CL 105 101 104 106  CO2 22 27 24 25   GLUCOSE 98 98 99 111*  BUN 21* 22* 24* 21*  CREATININE 0.94 0.83 0.93 0.87  CALCIUM 9.5 9.4 9.3 9.5  MG  --  2.2  --  2.2  PHOS  --   --   --  3.9     Microbiology Studies:   No results found for this or any previous visit (from the past 240 hours).   Radiology Studies:  No results found.   Scheduled Meds:    benztropine   1 mg Oral Q12H   Chlorhexidine  Gluconate Cloth  6 each Topical Daily   clonazePAM   0.5 mg Oral BID   docusate  100 mg Oral Daily   feeding supplement  237 mL Oral BID BM   folic acid   1 mg Oral Daily   gabapentin   200 mg Oral TID   heparin  injection (subcutaneous)  5,000 Units Subcutaneous Q8H   multivitamin with minerals  1 tablet Oral Daily  nicotine   14 mg Transdermal Daily   pantoprazole   40 mg Oral QHS   polyethylene glycol  17 g Oral Daily   propranolol   40 mg Oral TID   senna  2 tablet Oral Daily   sodium chloride  flush  10-40 mL Intracatheter Q12H   thiamine   100 mg Oral Daily    Continuous Infusions:    vancomycin  1,000 mg (08/27/24 0825)     LOS: 43 days     Joette Pebbles, MD,    To contact the attending provider between 7A-7P or the covering provider during after hours 7P-7A, please log into the web site [email or web address] and access using universal [place] password for that web site. If you do not have the password, please call the hospital operator.  08/27/2024, 10:18 AM

## 2024-08-27 NOTE — Plan of Care (Signed)
  Problem: Education: Goal: Knowledge of General Education information will improve Description: Including pain rating scale, medication(s)/side effects and non-pharmacologic comfort measures Outcome: Progressing   Problem: Health Behavior/Discharge Planning: Goal: Ability to manage health-related needs will improve Outcome: Progressing   Problem: Clinical Measurements: Goal: Ability to maintain clinical measurements within normal limits will improve Outcome: Progressing Goal: Will remain free from infection Outcome: Progressing Goal: Diagnostic test results will improve Outcome: Progressing Goal: Respiratory complications will improve Outcome: Progressing Goal: Cardiovascular complication will be avoided Outcome: Progressing   Problem: Activity: Goal: Risk for activity intolerance will decrease Outcome: Progressing   Problem: Nutrition: Goal: Adequate nutrition will be maintained Outcome: Progressing   Problem: Coping: Goal: Level of anxiety will decrease Outcome: Progressing   Problem: Elimination: Goal: Will not experience complications related to bowel motility Outcome: Progressing Goal: Will not experience complications related to urinary retention Outcome: Progressing   Problem: Pain Managment: Goal: General experience of comfort will improve and/or be controlled Outcome: Progressing   Problem: Safety: Goal: Ability to remain free from injury will improve Outcome: Progressing   Problem: Skin Integrity: Goal: Risk for impaired skin integrity will decrease Outcome: Progressing   Problem: Education: Goal: Knowledge of the prescribed therapeutic regimen will improve Outcome: Progressing   Problem: Clinical Measurements: Goal: Usual level of consciousness will be regained or maintained. Outcome: Progressing Goal: Neurologic status will improve Outcome: Progressing Goal: Ability to maintain intracranial pressure will improve Outcome: Progressing    Problem: Skin Integrity: Goal: Demonstration of wound healing without infection will improve Outcome: Progressing

## 2024-08-27 NOTE — Consult Note (Cosign Needed Addendum)
 [facility] Psychiatric Consult Follow-up  Patient Name: .Ernest Romero  MRN: [id number]  DOB: [birth year]  Consult Order details:  Orders (From admission, onward)     Start     Ordered   08/22/24 0842  IP CONSULT TO PSYCHIATRY       Ordering Provider: Verdene Purchase, MD  Provider:  (Not yet assigned)  Question Answer Comment  Location [facility]   Reason for Consult? patient with h/o bipolar d/o. was on lithium . here with subdural hematoma and MRSA. improving. some facial trmors. Need help with medication management. he was on lithium , lurasidone . requiring haldol  prn.  thanks      08/22/24 0842   08/02/24 1712  IP CONSULT TO PSYCHIATRY       Ordering Provider: Adolph Tinnie FORBES, PA-C  Provider:  (Not yet assigned)  Question Answer Comment  Location [facility]   Reason for Consult? bipolar      08/02/24 1711   07/24/24 1656  IP CONSULT TO PSYCHIATRY       Ordering Provider: Patsy Lenis, MD  Provider:  (Not yet assigned)  Question Answer Comment  Location [facility]   Reason for Consult? needing formal eval for capacity; may need guardianship given no family/support for discharge      07/24/24 1656   07/16/24 1412  IP CONSULT TO PSYCHIATRY       Ordering Provider: Dino Antu, MD  Provider:  (Not yet assigned)  Question Answer Comment  Location [facility]   Reason for Consult? 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  MDD, recurrent, severe, without psychotic  features 3.  Stimulant use disorder, in controlled setting Rule out traumatic brain injury from chronic subdural hematomas and neurosurgical procedures and complications Rule out bipolar disorder but likely previous manic episodes were meth induced per chart review  Assessment  Ernest Romero is a 58 y.o. male admitted: Medicallyfor 07/15/2024 11:15 PM for recurrent subdural hematoma. He carries the psychiatric diagnoses of bipolar disorder and alcohol use disorder and has a past medical history of subdural hematoma status postcraniotomy.   Current differential includes drug-induced parkinsonism as most likely issue that is slowly responding to medications.  DIP is most consistent with the characteristics of tremor including the frequency of the tremor being slower and consistent, being present at rest, and resolving with patient focusing on stopping it or being distracted.  Similarly to this there is some concern patient might have rapid syndrome which should be treated similarly to DIP.  Due to concern for drug-induced parkinsonism continuing benztropine  1 mg twice daily.  Furthermore there is concern for akathisia as patient has significant subjective anxiety and restlessness and some reports that akathisia can present in the perioral area similar to patient.  Patient has had questionable response to benzos and propranolol .  Plan to have primary team titrate propranolol  as tolerated for blood pressure so that this medication is present regardless and given it would help patient with his anxiety.  Furthermore plan to increase benzodiazepines as patient has  only been on a very small dose in order to rule out akathisia and help symptomatically while patient is given more time off antipsychotics.  Also some increasing concern for tardive tremor which is not characteristic of the tremor given these appear is more of a choreatic movements and less of a rhythmic movement but would warrant trialing with  Ingrezza  if not responding to current regimen and being off antipsychotics in a couple more days.  Lower suspicion is functional tremor.  Other causes are must rule out so we are planning to treat regardless of this.  Patient is noted to have this tremor even when he is not being watched.  This is diagnosis of exclusion.   Regarding next steps we will continue to monitor patient on higher dose of clonazepam  and continue the propranolol  and benztropine .  Furthermore we will talk with the nurse to see if patient is having the tremors in his sleep which would be more suggestive of tardive dyskinesia.   Diagnoses:  Active Hospital problems: Principal Problem:   Subdural hematoma (HCC) Active Problems:   Bipolar disorder (HCC)   Essential hypertension   Alcohol withdrawal (HCC)   Unable to make decisions about medical treatment due to impaired mental capacity   Protein-calorie malnutrition, severe   Acute metabolic encephalopathy   Infection of craniotomy plate    Plan   ## Psychiatric Medication Recommendations:  Continue mirtazapine  15 mg once daily at bedtime for akathisia, insomnia, mood; monitor for mania switching but has lower risk of manic switch than other antidepressant if true bipolar Continue propranolol  as can be helpful for akithisia if present, can help anxiety, and primary team can titrate for HTN Continue benztropine  1 mg twice daily for parkinsonism Increase clonazepam  to 1 mg 3 times daily for possible akathisia; per nursing pt had benefit from ativan  dose today Ativan  0.5 PRN could potentially be d/c soon Consider pregabalin for TBI / anxiety in future Resolutions of symptoms can take up to 7 days so we will continue to monitor.  ## Medical Decision Making Capacity: Not specifically addressed in this encounter  ## Further Work-up:  -- TSH 1 month ago 0.775 While pt on Qtc prolonging medications, please monitor & replete K+ to 4 and Mg2+ to 2 -- most recent EKG on  11/19 had QtC of 414 -- Pertinent labwork reviewed earlier this admission includes: GFR greater than 60, LFT relatively within normal limits mildly elevated ALT, hemoglobin 11-12   ## Disposition:--Need to see persistent improvement in his tremor/severe anxiety before discharging as missing akathisia comes with much higher risk of suicide.  Once improving there is no psychiatric contraindications to discharge  ## Behavioral / Environmental: -Delirium Precautions: Delirium Interventions for Nursing and Staff: - RN to open blinds every AM. - To Bedside: Glasses, hearing aide, and pt's own shoes. Make available to patients. when possible and encourage use. - Encourage po fluids when appropriate, keep fluids within reach. - OOB to chair with meals. - Passive ROM exercises to all extremities with AM & PM care. - RN to assess orientation to person, time and place QAM and PRN. - Recommend extended visitation hours with familiar family/friends as feasible. - Staff to minimize disturbances at night. Turn off television when pt asleep or when not in use.    ## Safety and Observation Level:  - Based on my clinical evaluation, I estimate the patient to be at low risk of self harm in the current setting. - At this time, we recommend  routine. This decision is based on my review of the chart including patient's history and current presentation, interview of the patient, mental status examination, and consideration of suicide risk including evaluating suicidal ideation, plan, intent, suicidal or self-harm behaviors, risk factors, and protective factors. This judgment is based on our ability to directly address suicide risk, implement suicide prevention strategies, and develop a safety plan while the patient is in the clinical setting. Please contact our team if there is a concern that risk level has changed.  CSSR Risk Category:C-SSRS RISK CATEGORY: No Risk  Suicide Risk Assessment: Patient has following modifiable  risk factors for suicide: untreated depression, which we are addressing by medication adjustments. Patient has following non-modifiable or demographic risk factors for suicide: Male and psychiatric hospitalization Patient has the following protective factors against suicide: n/a  Thank you for this consult request. Recommendations have been communicated to the primary team.  We will continue to follow at this time.   Justino Cornish, MD       History of Present Illness  Relevant Aspects of [facility] Course per hospitalist:  PCCM Xfer 46/64. 58 year old male, homeless, medical history significant for alcohol use disorder, bipolar and panic disorder, initially presented to [facility] from jail in September 2025 with a large subdural hematoma.  Underwent craniotomy with flap replacement on 10/27.  Since then multiple OR visits including 10/30 for SDH evacuation, follow-up CT head 11/8 showed fluid collection underneath the bone flap, back to OR on 11/10 and the bone flap was removed, cultures were obtained, intraoperative cultures grew MRSA, treating for infected bone flap, ID on board and currently on IV vancomycin . Refer to PCCM progress note from 11/11 for detailed tabulation of hospital events until then.  Patient Report:  Patient reports that it is still severe and that he is thinking about harming himself because of how severe it is.  Reports that he is not sure exactly was helping but reports that the 1 he got late afternoon (propranolol ) was helpful for him.  Reports that he is not sure if he is gotten better or worse since yesterday.  Reports he is unsure about this morning if the medicines have helped at all.  Says that they help for a very short period time and then they stop helping.  Reports that he is sleeping okay at night.  Reports he is having a lot of anxiety and restlessness.  Reports that tremors present at rest.  Reports that the tremor goes away whenever he focus on  something.   Psych ROS:  Depression: Reports he has been having depressed mood, anhedonia, worthlessness, at lowest in his life, but denies suicidal thoughts but does have morbid rumination Anxiety: Reports that he is an anxious person but that the anxiety has been much worse since being the hospital.  Says that he feels restless and is having issues sleeping because of it.  Reports that he is anxious about his situation where he will go from here. Mania (lifetime and current): Endorses having manic episodes but when asking about these episodes he does not describe any increased goal-directed activity, denies any decreased need for sleep, but does endorse feeling better, talking more, and impulsivity.  Denies having any dysfunction during these times.  Denies having any distractibility, grandiosity, flight of ideas, risky behaviors. Psychosis: (lifetime and current): Denies AVH or paranoia. Sleep: Reports that he cannot sleep with the shaking and in general   Psychiatric and Social History  Psychiatric History:  Information collected from patient  and chart review  Prev Dx/Sx: Bipolar disorder, methamphetamine use, substance-induced mood disorder, alcohol use disorder Current Psych Provider: None Home Meds (current): Restarted on Latuda  40 and titrated to 80 this hospitalization, was not taking anything prior to admission in the short period that  He was away from hospital.  Also restarted on Klonopin  Previous Med Trials: Lithium , Klonopin , Latuda , Haldol  as needed for agitation Therapy: None  Prior Psych Hospitalization: Previous hospitalizations although the psychiatric hospitalizations are not documented but the emergency department visits prior to have been documented and include episodes documented as mania but are occurring in the presence of positive amphetamine Prior Self Harm: Denies Prior Violence: Denies  Family Psych History: Denies Family Hx suicide: Denies  Social History:   Reports that he has been experiencing homelessness.  Does not report any recent incarceration which appears to conflict with previous chart review.  Does report he has a upcoming court date on December 7.  Denies access to weapons.  Reports that his hobby is making rock music.  Reports that he has a friend who is his international aid/development worker. Access to weapons/lethal means: Denies  Substance History Denies using any substances recently.  Has been in controlled setting.  Does have history of using stimulants but he denies when asked during interview.  Exam Findings  Physical Exam:  Vital Signs:  Temp:  [97.4 F (36.3 C)-98.4 F (36.9 C)] 97.4 F (36.3 C) (11/25 0738) Pulse Rate:  [53-69] 55 (11/25 0738) Resp:  [18-19] 19 (11/25 0738) BP: (98-119)/(64-88) 101/79 (11/25 0738) SpO2:  [98 %-100 %] 100 % (11/25 0738) Blood pressure 101/79, pulse (!) 55, temperature (!) 97.4 F (36.3 C), resp. rate 19, height 5' 10 (1.778 m), weight 66 kg, SpO2 100%. Body mass index is 20.88 kg/m.  Physical Exam Constitutional:      Comments: Chronic ill appearing  Pulmonary:     Effort: Pulmonary effort is normal.  Neurological:     Comments: No pain or stiffness with passive ROM of bilateral upper extremities.       Mental Status Exam: General Appearance: Patient is present in his room sitting in bed unable to move himself very well, blinds are closed, laying in clean gown, neurosurgical scar on head,  Orientation: Oriented to person place but not time  Memory: Did not formally assess  Concentration: Did not formally assess but grossly intact  Recall: Grossly intact but did not formally assess  Attention grossly intact but not formally assessed  Eye Contact: Good  Speech: Fair, dysarthric  Language: Fair  Volume: Normal  Mood: Lowest point my life  Affect: Depressed, flat, anxious  Thought Process: Coherent linear  Thought Content: Logical.  Morbid ruminations.  Worried about current situation  and future.  Suicidal Thoughts:  No  Homicidal Thoughts:  No  Judgement:  Fair  Insight:  Fair  Psychomotor Activity:  Increased, dyskinetic movements of mouth and hands.  With mouth movement is sliding jaw back and forth.  There is some voluntary inhibition but is overall involuntary when he is not overriding.   Akathisia:  Yes  Fund of Knowledge:  Fair      Assets:  Desire for Improvement  Cognition: Did not formally assess  ADL's: Impaired by patient's movement limitations but is improving  AIMS (if indicated):  0     Other History   These have been pulled in through the EMR, reviewed, and updated if appropriate.  Family History:  The patient's family history is not on file.  Medical History: Past Medical History:  Diagnosis Date  . Anxiety   . Bipolar 1 disorder (HCC)   . Depression   . Hepatitis   . Hypertension   . MRSA (methicillin resistant Staphylococcus aureus)    5/25 arm abscess and 11/25    Surgical History: Past Surgical History:  Procedure Laterality Date  . BURR HOLE N/A 08/01/2024   Procedure: CREATION, CRANIAL BURR HOLE WITH EVACUATION OF HEMATOMA;  Surgeon: Rosslyn Dino HERO, MD;  Location: [facility] OR;  Service: Neurosurgery;  Laterality: N/A;  . BURR HOLE N/A 08/12/2024   Procedure: left bone flap removal;  Surgeon: Rosslyn Dino HERO, MD;  Location: [facility] OR;  Service: Neurosurgery;  Laterality: N/A;  . CRANIOPLASTY, WITH BONE FLAP REPLACEMENT Left 07/29/2024   Procedure: CRANIOPLASTY, WITH BONE FLAP REPLACEMENT;  Surgeon: Rosslyn Dino HERO, MD;  Location: [facility] OR;  Service: Neurosurgery;  Laterality: Left;  LEFT CRANIOPLASTY WITH ARTIFICAL BONE FLAP REPLACEMENT  . CRANIOTOMY Left 06/30/2024   Procedure: CRANIOTOMY HEMATOMA EVACUATION SUBDURAL;  Surgeon: Melonie Grass, MD;  Location: [facility] ORS;  Service: Neurosurgery;  Laterality: Left;     Medications:   Current Facility-Administered Medications:  .  acetaminophen  (TYLENOL ) tablet 650 mg, 650 mg, Oral,  Q4H PRN, 650 mg at 08/27/24 1033 **OR** [PENDING] acetaminophen  (TYLENOL ) tablet 650 mg, 650 mg, Per Tube, Q4H PRN **OR** acetaminophen  (TYLENOL ) suppository 650 mg, 650 mg, Rectal, Q4H PRN **OR** [PENDING] acetaminophen  (TYLENOL ) suppository 650 mg, 650 mg, Rectal, Q4H PRN,  .  benztropine  (COGENTIN ) tablet 1 mg, 1 mg, Oral, Q12H, Cornelius, Brenden Rudman, MD, 1 mg at 08/27/24 0818 .  Chlorhexidine  Gluconate Cloth 2 % PADS 6 each, 6 each, Topical, Daily, Franky Redia SAILOR, MD, 6 each at 08/27/24 0819 .  clonazePAM  (KLONOPIN ) tablet 1 mg, 1 mg, Oral, TID, Bradshaw Minihan, MD, 1 mg at 08/27/24 1428 .  docusate (COLACE) 50 MG/5ML liquid 100 mg, 100 mg, Oral, Daily, Hongalgi, Anand D, MD, 100 mg at 08/27/24 0818 .  docusate sodium  (COLACE) capsule 100 mg, 100 mg, Oral, BID PRN, Autry, Lauren E, PA-C, 100 mg at 08/25/24 0847 .  feeding supplement (ENSURE PLUS HIGH PROTEIN) liquid 237 mL, 237 mL, Oral, BID BM, Danford, Christopher P, MD, 237 mL at 08/27/24 1428 .  folic acid  (FOLVITE ) tablet 1 mg, 1 mg, Oral, Daily, Ogbata, Sylvester I, MD, 1 mg at 08/27/24 0818 .  gabapentin  (NEURONTIN ) capsule 200 mg, 200 mg, Oral, TID, Danford, Lonni SQUIBB, MD, 200 mg at 08/27/24 0818 .  heparin  injection 5,000 Units, 5,000 Units, Subcutaneous, Q8H, Desai, Rahul P, PA-C, 5,000 Units at 08/27/24 1428 .  labetalol  (NORMODYNE ) injection 10-40 mg, 10-40 mg, Intravenous, Q10 min PRN, Janjua, Rashid M, MD .  LORazepam  (ATIVAN ) injection 0.5 mg, 0.5 mg, Intravenous, Q6H PRN, Krishnan, Gokul, MD, 0.5 mg at 08/27/24 0649 .  melatonin tablet 10 mg, 10 mg, Oral, QHS PRN, Kenard, George J, MD, 10 mg at 08/26/24 2149 .  multivitamin with minerals tablet 1 tablet, 1 tablet, Oral, Daily, Rosario Eland I, MD, 1 tablet at 08/27/24 0818 .  nicotine  (NICODERM CQ  - dosed in mg/24 hours) patch 14 mg, 14 mg, Transdermal, Daily, Fredia Dorothe HERO, MD, 14 mg at 08/27/24 0820 .  ondansetron  (ZOFRAN ) tablet 4 mg, 4 mg, Oral, Q4H PRN **OR**  ondansetron  (ZOFRAN ) injection 4 mg, 4 mg, Intravenous, Q4H PRN, Janjua, Rashid M, MD, 4 mg at 08/01/24 1506 .  Oral care mouth rinse, 15 mL, Mouth Rinse, PRN, Mannam, Praveen, MD .  pantoprazole  (PROTONIX ) EC  tablet 40 mg, 40 mg, Oral, QHS, Hongalgi, Anand D, MD, 40 mg at 08/26/24 2149 .  polyethylene glycol (MIRALAX  / GLYCOLAX ) packet 17 g, 17 g, Oral, Daily, Desai, Rahul P, PA-C, 17 g at 08/27/24 0818 .  promethazine  (PHENERGAN ) tablet 12.5-25 mg, 12.5-25 mg, Oral, Q4H PRN, Janjua, Rashid M, MD .  propranolol  (INDERAL ) tablet 20 mg, 20 mg, Oral, BID, Krishnan, Gokul, MD .  [facility]) tablet 17.2 mg, 2 tablet, Oral, Daily, Desai, Rahul P, PA-C, 17.2 mg at 08/27/24 0818 .  sodium chloride  flush (NS) 0.9 % injection 10-40 mL, 10-40 mL, Intracatheter, Q12H, Franky Redia SAILOR, MD, 10 mL at 08/27/24 0820 .  sodium chloride  flush (NS) 0.9 % injection 10-40 mL, 10-40 mL, Intracatheter, PRN, Franky Redia SAILOR, MD, 30 mL at 07/29/24 1135 .  thiamine  (VITAMIN B1) tablet 100 mg, 100 mg, Oral, Daily, Hongalgi, Anand D, MD, 100 mg at 08/27/24 0818 .  vancomycin  (VANCOCIN ) IVPB 1000 mg/200 mL premix, 1,000 mg, Intravenous, Q12H, Jimenez, Aileen, [facility], Last Rate: 200 mL/hr at 08/27/24 0825, 1,000 mg at 08/27/24 0825  Allergies: No Known Allergies  Justino Cornish, MD

## 2024-08-27 NOTE — Progress Notes (Signed)
 Nutrition Follow-up  DOCUMENTATION CODES:   Severe malnutrition in context of acute illness/injury  INTERVENTION:  Encourage PO intake at meals Pt prefers bites sized solids, continue DYS3 diet  Room service with assist  Ensure Plus High Protein po BID, each supplement provides 350 kcal and 20 grams of protein. Continue MVI, Thiamine , Folic acid  supplementation   NUTRITION DIAGNOSIS:   Severe Malnutrition related to acute illness as evidenced by energy intake < or equal to 50% for > or equal to 5 days, severe muscle depletion, moderate fat depletion. - Ongoing   GOAL:   Patient will meet greater than or equal to 90% of their needs - Progressing   MONITOR:   TF tolerance, Labs, Weight trends  REASON FOR ASSESSMENT:   Malnutrition Screening Tool, NPO/Clear Liquid Diet    ASSESSMENT:   PMH significant for ETOH abuse, HTN, bipolar disorder, anxiety/depression, hepatitis, craniotomy 06/30/24 for SDH during incarceration, presented on 10/13 for management of craniotomy s/p craniotomy with flap replacement 10/27.   9/28 - admitted to [facility] with SDH s/p post prontoparietal craniotomy with evacuation 10/13 - tx to [facility] 10/27 - s/p L allograft cranioplasty with flap replacement 10/29 - cortrak tube placed; xray with tip in descending duodenum  10/31 - to OR for SDH evacuation; diet advanced to Dysphagia 3; eating 75% of meals 11/2 - Tube feeds discontinued  11/3 - Transferred to progressive  11/4 - Cortrak removed 11/10 - OR for L bone flap removal, transferred to ICU 11/11 - Transferred back to progressive   Patient resting in bed, did not awake to voice. Pt continues to consume > 75% of meals per flowsheet documentation. Remains on DYS 3 diet per patient preference. Psych following for facial tremor management, concern for drug induced parkinsonism.   Admit weight: 67.5 kg Current weight: 66 kg   Average Meal Intake: 10/31-11/3: 75% intake x 4 recorded meals 11/2-11/11: 73%  intake x 7 recorded meals  11/19-11/24: 78% intake x 8 recorded meals   Nutritionally Relevant Medications:   Labs Reviewed: Glu 111, BUN 21   Diet Order:   Diet Order             DIET DYS 3 Room service appropriate? Yes; Fluid consistency: Thin  Diet effective now                   EDUCATION NEEDS:   Not appropriate for education at this time  Skin:  Skin Assessment: Skin Integrity Issues: Skin Integrity Issues:: Incisions Incisions: closed surgical incision to head  Last BM:  11/25  Height:   Ht Readings from Last 1 Encounters:  08/12/24 5' 10 (1.778 m)    Weight:   Wt Readings from Last 1 Encounters:  08/26/24 66 kg    Ideal Body Weight:  75.5 kg  BMI:  Body mass index is 20.88 kg/m.  Estimated Nutritional Needs:   Kcal:  2100-2400  Protein:  110-130  Fluid:  2100-2400  Shabria Egley, MS, RD, LDN Clinical Dietitian  Contact via secure chat. If unavailable, use group chat RD Inpatient.

## 2024-08-27 NOTE — Progress Notes (Signed)
 Speech Language Pathology Treatment: Cognitive-Linguistic  Patient Details Name: Ernest Romero MRN: [id number] DOB: Mar 04, 1966 Today's Date: 08/27/2024 Time: 8359-8342 SLP Time Calculation (min) (ACUTE ONLY): 17 min  Assessment / Plan / Recommendation Clinical Impression  Patient was alert and awake. Session today focused on cognitive treat. Patient refused all PO's offered, swallow not assessed today. Patient continued to express concerns about RN coming to deliver anxiety meds throughout the session. SLP focused on calendar activity. Patient correctly identified months, days of the week, dates, and could identify dates in the past and future. Patient completed tasks correctly with no assistance from SLP. At the end of the session, patient grew frustrated that nurse hadn't stopped by to deliver meds and got out of bed to look for her. Patient independently put on helmet although backwards, before walking around his room. SLP left with RN in room delivering patient meds. SLP will continue to follow.    HPI HPI: 58 yo male with history of alcohol use disorder, psychiatric disorder, and HTN, who presents 10/14 from [facility], where he was admitted 9/28 after an unresponsive episode in jail. Found to have large SDH s/p L frontoparietal craniotomy with evacuation of SDH and drain placement 9/28 (removed 9/29). Followed by SLP for cognitive-linguistic goals and on a regular diet prior to transfer. Transferred to [facility] for further management of craniotomy and flab. S/p cranioplasty with bone flap replacement 10/27. Change in status thought to be related to Klonopin  administration 10/28, repeat CTH shows significant increase in size of mixed attenuation subdural fluid collection underlying the L tempoparietal craniotomy site, now measuring approximately 15 mm in thickness, with associated mild mass effect and slight rightward midline shift s/p L frontal burr hole for evacuation 10/30. Underwent L bone flap removal  11/10 after developing SDH s/p burr hole evacuation followed by swelling and suspected infection.      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

## 2024-08-28 LAB — COMPREHENSIVE METABOLIC PANEL WITH GFR
ALT: 40 U/L (ref 0–44)
AST: 26 U/L (ref 15–41)
Albumin: 3.1 g/dL — ABNORMAL LOW (ref 3.5–5.0)
Alkaline Phosphatase: 69 U/L (ref 38–126)
Anion gap: 9 (ref 5–15)
BUN: 19 mg/dL (ref 6–20)
CO2: 27 mmol/L (ref 22–32)
Calcium: 9.5 mg/dL (ref 8.9–10.3)
Chloride: 106 mmol/L (ref 98–111)
Creatinine, Ser: 1.08 mg/dL (ref 0.61–1.24)
GFR, Estimated: >60 mL/min (ref >60)
Glucose, Bld: 108 mg/dL — ABNORMAL HIGH (ref 70–99)
Potassium: 4.3 mmol/L (ref 3.5–5.1)
Sodium: 142 mmol/L (ref 135–145)
Total Bilirubin: 0.7 mg/dL (ref 0.0–1.2)
Total Protein: 6.1 g/dL — ABNORMAL LOW (ref 6.5–8.1)

## 2024-08-28 LAB — CBC
HCT: 36.5 % — ABNORMAL LOW (ref 39.0–52.0)
Hemoglobin: 11.9 g/dL — ABNORMAL LOW (ref 13.0–17.0)
MCH: 29.6 pg (ref 26.0–34.0)
MCHC: 32.6 g/dL (ref 30.0–36.0)
MCV: 90.8 fL (ref 80.0–100.0)
Platelets: 276 K/uL (ref 150–400)
RBC: 4.02 MIL/uL — ABNORMAL LOW (ref 4.22–5.81)
RDW: 13 % (ref 11.5–15.5)
WBC: 6.1 K/uL (ref 4.0–10.5)
nRBC: 0 % (ref 0.0–0.2)

## 2024-08-28 LAB — VANCOMYCIN, TROUGH: Vancomycin Tr: 24 ug/mL (ref 15–20)

## 2024-08-28 MED ORDER — MIRTAZAPINE 15 MG PO TABS
15.0000 mg | ORAL_TABLET | Freq: Every day | ORAL | Status: AC
  Administered 2024-08-28 – 2024-11-08 (×72): 15 mg via ORAL
  Filled 2024-08-28 (×67): qty 1

## 2024-08-28 MED ORDER — GUAIFENESIN-DM 100-10 MG/5ML PO SYRP
5.0000 mL | ORAL_SOLUTION | ORAL | Status: AC | PRN
  Administered 2024-09-08 – 2024-09-19 (×3): 5 mL via ORAL
  Filled 2024-08-28 (×3): qty 10

## 2024-08-28 MED ORDER — CLONAZEPAM 1 MG PO TABS
1.0000 mg | ORAL_TABLET | Freq: Once | ORAL | Status: AC
  Administered 2024-08-28: 1 mg via ORAL
  Filled 2024-08-28: qty 1

## 2024-08-28 MED ORDER — CLONAZEPAM 1 MG PO TABS
2.0000 mg | ORAL_TABLET | Freq: Three times a day (TID) | ORAL | Status: DC
  Administered 2024-08-28 – 2024-09-03 (×13): 2 mg via ORAL
  Filled 2024-08-28 (×2): qty 2
  Filled 2024-08-28: qty 4
  Filled 2024-08-28 (×4): qty 2
  Filled 2024-08-28: qty 4
  Filled 2024-08-28 (×2): qty 2
  Filled 2024-08-28: qty 4
  Filled 2024-08-28 (×3): qty 2
  Filled 2024-08-28: qty 4
  Filled 2024-08-28 (×2): qty 2

## 2024-08-28 NOTE — Progress Notes (Signed)
 PROGRESS NOTE   Ernest Romero  FMW:[id number]    DOB: December 16, 1965    DOA: 07/15/2024  PCP: [name] Course:  PCCM Xfer 69/86. 58 year old male, homeless, medical history significant for alcohol use disorder, bipolar and panic disorder, initially presented to [facility] from jail in September 2025 with a large subdural hematoma.  Underwent craniotomy with flap replacement on 10/27.  Since then multiple OR visits including 10/30 for SDH evacuation, follow-up CT head 11/8 showed fluid collection underneath the bone flap, back to OR on 11/10 and the bone flap was removed, cultures were obtained, intraoperative cultures grew MRSA, treating for infected bone flap, ID on board and currently on IV vancomycin . Refer to PCCM progress note from 11/11 for detailed tabulation of hospital events until then.   Assessment & Plan:   Subdural hematoma  S/P initial craniotomy 9/28 (Dr. Loa poor barr)  S/P skull flap left cranioplasty 10/27 (Dr. Rosslyn).  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 Patient being followed periodically by neurosurgery, Dr. Rosslyn. Patient was seen by infectious disease.  Patient remains on vancomycin .  End date for IV antibiotics is 09/23/2024.  After he has completed course of IV antibiotics patient to be treated with doxycycline  for 8 weeks. PICC line to be placed closer to discharge. Patient remains stable from neurological standpoint. Patient has completed 1 week of Keppra . Still has sutures.  Surgery was on 11/10.  Dr. Janjua mentions that sutures should come out 3 weeks postoperatively.  So sutures should come out on December 1.   Therapies recommend SNF.  TOC is following.     Acute metabolic encephalopathy At baseline, patient reportedly without cognitive impairment, was homeless and independent caring for self. Per prior review of Dr. Rosario, [facility] MD note from 11/10, patient unable to make decisions  about medical treatment due to impaired capacity, secondary to neurosurgical issues noted above and multifactorial. Also per Dr. Donn note from 7/10, given his current deficits in planning/executive function/memory, patient is likely to need guardianship for decisions i.e. placement. Patient's support counselor Larnell [name] has been visiting intermittently. [phone]).   Per information available to medical team, he does not have kids, his spouse passed away. Has sister, estranged.  Patient mentation has significantly improved in the last few days.  He seems to be close to his baseline. He is able to think objectively and is more aware of his medical issues.  Seems to have better judgment now.  Should be able to decide for himself going forward.  Bipolar disorder/akathisia/drug-induced parkinsonism Patient with longstanding history of bipolar disorder.  Was on lithium  previously but has not been on it since 10/27.  Patient was requiring Haldol  and Latuda  dose was increased. However subsequently patient was noted to have facial tremors 11/20.  Concern for tardive dyskinesia.  Psychiatry was consulted to assist with management.  Haldol  and Latuda  were discontinued.  Patient continues to have tremors of the lower jaw. EEG does not show any seizure activity. Patient was started on propranolol  and Ativan  as needed.  Clonazepam  was continued. Patient with tremors persist.  Concern was for drug-induced parkinsonism.  Dose of propranolol  was increased yesterday.  Seen by psychiatry again they have added benztropine .  Patient mentioned that he is feeling better though he continues to have tremors.   Fending medications have been discontinued.  Psychiatry continues to follow.   History of alcohol, tobacco use disorder Continue thiamine , folic acid , multivitamin  Treated for alcohol withdrawal delirium with phenobarbital  taper at [facility].  Alcohol withdrawal resolved.   Essential HTN With initiation of  propranolol  for his tremors his blood pressure was noted to be low.  Amlodipine  and losartan  were discontinued.   Blood pressure remains soft.  Now that it appears the tremors are most likely due to drug-induced parkinsonism the dose of propranolol  is not entirely clear.  Will decrease the dose to avoid hypotension.  Constipation Continue bowel regimen.  Nutrition Status: Nutrition Problem: Severe Malnutrition Etiology: acute illness Signs/Symptoms: energy intake < or equal to 50% for > or equal to 5 days, severe muscle depletion, moderate fat depletion Interventions: Tube feeding Agree with dietitian evaluation and management.  Body mass index is 20.91 kg/m.   DVT prophylaxis: Subcutaneous heparin    Code Status: Full Code:  Family Communication: None at bedside. Disposition: SNF when medically stable.  TOC is following closely.  They have been communicating with patient's support counselor.  Support counselor is trying to arrange an apartment for him.  As per psychiatry will need to wait for his facial tremors to improve before he can be safely discharged.   Consultants:   PCCM Neurology Neurosurgery Psychiatry Infectious disease  Procedures:   As noted above  Subjective:  Patient Complains of facial/ lower jaw tremors.  No other complaints or events reported. Objective:   Vitals:   08/28/24 0500 08/28/24 0809 08/28/24 1049 08/28/24 1159  BP:  (!) 86/63 107/71 (!) 88/60  Pulse:  61 66 (!) 59  Resp:  20  20  Temp:  (!) 97.5 F (36.4 C)  97.8 F (36.6 C)  TempSrc:  Oral    SpO2:  100%  98%  Weight: 66.1 kg     Height:       General appearance: Awake alert.  In no distress Resp: Clear to auscultation bilaterally.  Normal effort Cardio: S1-S2 is normal regular.  No S3-S4.  No rubs murmurs or bruit GI: Abdomen is soft.  Nontender nondistended.  Bowel sounds are present normal.  No masses organomegaly Extremities: No edema.  Full range of motion of lower  extremities. Continues to have facial tremors.  No other neurological deficits.  Data Reviewed:     CBC: Recent Labs  Lab 08/23/24 0134 08/28/24 0348  WBC 7.6 6.1  HGB 12.4* 11.9*  HCT 38.1* 36.5*  MCV 89.9 90.8  PLT 334 276    Basic Metabolic Panel: Recent Labs  Lab 08/23/24 0134 08/25/24 1235 08/26/24 0547 08/28/24 0348  NA 137 140 142 142  K 4.0 4.1 4.3 4.3  CL 101 104 106 106  CO2 27 24 25 27   GLUCOSE 98 99 111* 108*  BUN 22* 24* 21* 19  CREATININE 0.83 0.93 0.87 1.08  CALCIUM 9.4 9.3 9.5 9.5  MG 2.2  --  2.2  --   PHOS  --   --  3.9  --      Microbiology Studies:   No results found for this or any previous visit (from the past 240 hours).   Radiology Studies:  No results found.   Scheduled Meds:    benztropine   1 mg Oral Q12H   Chlorhexidine  Gluconate Cloth  6 each Topical Daily   clonazePAM   1 mg Oral TID   docusate  100 mg Oral Daily   feeding supplement  237 mL Oral BID BM   folic acid   1 mg Oral Daily   gabapentin   200 mg Oral TID   heparin  injection (subcutaneous)  5,000 Units Subcutaneous Q8H   mirtazapine   15 mg Oral QHS   multivitamin with minerals  1 tablet Oral Daily   nicotine   14 mg Transdermal Daily   pantoprazole   40 mg Oral QHS   polyethylene glycol  17 g Oral Daily   propranolol   20 mg Oral BID   senna  2 tablet Oral Daily   sodium chloride  flush  10-40 mL Intracatheter Q12H   thiamine   100 mg Oral Daily    Continuous Infusions:    vancomycin  1,000 mg (08/28/24 1206)     LOS: 44 days     Ellouise SHAUNNA Ra, MD,    To contact the attending provider between 7A-7P or the covering provider during after hours 7P-7A, please log into the web site [email or web address] and access using universal [place] password for that web site. If you do not have the password, please call the hospital operator.  08/28/2024, 2:30 PM

## 2024-08-28 NOTE — Plan of Care (Signed)
  Problem: Education: Goal: Knowledge of General Education information will improve Description: Including pain rating scale, medication(s)/side effects and non-pharmacologic comfort measures Outcome: Progressing   Problem: Health Behavior/Discharge Planning: Goal: Ability to manage health-related needs will improve Outcome: Progressing   Problem: Clinical Measurements: Goal: Ability to maintain clinical measurements within normal limits will improve Outcome: Progressing Goal: Will remain free from infection Outcome: Progressing Goal: Diagnostic test results will improve Outcome: Progressing Goal: Respiratory complications will improve Outcome: Progressing Goal: Cardiovascular complication will be avoided Outcome: Progressing   Problem: Activity: Goal: Risk for activity intolerance will decrease Outcome: Progressing   Problem: Nutrition: Goal: Adequate nutrition will be maintained Outcome: Progressing   Problem: Coping: Goal: Level of anxiety will decrease Outcome: Progressing   Problem: Elimination: Goal: Will not experience complications related to bowel motility Outcome: Progressing Goal: Will not experience complications related to urinary retention Outcome: Progressing   Problem: Pain Managment: Goal: General experience of comfort will improve and/or be controlled Outcome: Progressing   Problem: Safety: Goal: Ability to remain free from injury will improve Outcome: Progressing   Problem: Skin Integrity: Goal: Risk for impaired skin integrity will decrease Outcome: Progressing   Problem: Education: Goal: Knowledge of the prescribed therapeutic regimen will improve Outcome: Progressing   Problem: Clinical Measurements: Goal: Usual level of consciousness will be regained or maintained. Outcome: Progressing Goal: Neurologic status will improve Outcome: Progressing Goal: Ability to maintain intracranial pressure will improve Outcome: Progressing    Problem: Skin Integrity: Goal: Demonstration of wound healing without infection will improve Outcome: Progressing

## 2024-08-28 NOTE — Progress Notes (Addendum)
 I have found it difficult to monitor jaw tremor in the patient's sleep as he is constantly restless. Patient does continuously complain of jaw ache due to tremor and requesting medication for this at all times.  It seems patient is being woken by jaw tremor after every 1-2 hours of rest although it is difficult to confirm if tremor exists in sleep. Every time I would go to check patient after interventions, the patient would be found awake and restless in the room complaining of jaw tremor.  When given Ativan  at 1700, patient was restless and irritable as early as 1900 demanding night time medications ASAP for tremor. I had given the next dose at the earliest availability due to loss of IV access and dose at 0033 did not find any relief either. I do not personally see any relief supplied by Ativan  0.5mg  dose.  From the recent past, I do believe Mirtazapine  was more useful to resolve the patient's complaints, however the only dose I see was administered before the patient was transferred to 2W. I saw no mention of discontinuing this medication but it is no longer on the [facility]. It is unclear which of the medications best assist the patient because minimal to moderate tremor is persistently observed.

## 2024-08-28 NOTE — Progress Notes (Signed)
 Pharmacy Antibiotic Note  Ernest Romero is a 58 y.o. 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 Dosing for CNS penetration with target troughs 15-20 mcg/ml. Per ID, plan 6 weeks of Vancomycin  (thru 09/23/24) then Doxycyline PO to finish 8 week course.  ID plans outpatient follow up on 09/19/24.  Weekly Vanc trough planned for this am, but drawn with am labs at 0348, about 1/2 way through the dosing interval, so invalid. Reordered for 0930am but drawn in wrong tube, and morning dose was given ~2 hrs later while lab was initially in process. Vanc level = 24 mcg/ml mid-dosing interval, so expect true trough is at goal. Creatinine trended up some, BUN trended down.   Plan: Continue Vancomycin  1gm IV q12h. Will plan to check trough level on 11/28 am. Target Vanc troughs 15-20 mcg/ml. Bmet at least every 3 days while on Vanc. Vanc through 09/23/24, then Doxycycline  to finish 8 week course.  Height: 5' 10 (177.8 cm) Weight: 66.1 kg (145 lb 11.6 oz) IBW/kg (Calculated) : 73  Temp (24hrs), Avg:97.6 F (36.4 C), Min:97.3 F (36.3 C), Max:97.8 F (36.6 C)  Recent Labs  Lab 08/22/24 2109 08/23/24 0134 08/25/24 1235 08/26/24 0547 08/28/24 0348  WBC  --  7.6  --   --  6.1  CREATININE  --  0.83 0.93 0.87 1.08  VANCOTROUGH 19  --   --   --  24*    Estimated Creatinine Clearance: 68 mL/min (by C-G formula based on SCr of 1.08 mg/dL).    No Known Allergies  Antimicrobials this admission: Vancomycin  11/10 > Ceftazidime  11/10 >11/11   Dose adjustments this admission: 11/11 VT = 9 (true trough 7.6) on 750mg  q12 >> incr to 1g q8 11/14 VT = 23 >> decr 750mg  q8 11/16 VT = 11 >> adj to 1250mg  q12 11/19 VT = 24 >> dec to 1 gm IV q12h 11/21 VT=  19 mcg/ml - continue same  11/26 VT = 24 mcg/ml, but drawn ~6 hrs early at 0348 so invalid; repeated at 0940 - but drawn in wrong tube so unable to result - continue same  Microbiology results: 10/27 MRSA PCR: positive 11/10 bone  flap (tissue) x 2: one moderate and one abundant MRSA, MIC to Vanc 1  Thank you for allowing pharmacy to be a part of this patient's care.  Ernest Romero, [place] 08/28/2024 4:51 PM

## 2024-08-28 NOTE — Consult Note (Signed)
 [facility] Psychiatric Consult Follow-up  Patient Name: .Ernest Romero  MRN: [id number]  DOB: [birth year]  Consult Order details:  Orders (From admission, onward)     Start     Ordered   08/22/24 0842  IP CONSULT TO PSYCHIATRY       Ordering Provider: Verdene Purchase, MD  Provider:  (Not yet assigned)  Question Answer Comment  Location [facility]   Reason for Consult? patient with h/o bipolar d/o. was on lithium . here with subdural hematoma and MRSA. improving. some facial trmors. Need help with medication management. he was on lithium , lurasidone . requiring haldol  prn.  thanks      08/22/24 0842   08/02/24 1712  IP CONSULT TO PSYCHIATRY       Ordering Provider: Adolph Tinnie FORBES, PA-C  Provider:  (Not yet assigned)  Question Answer Comment  Location [facility]   Reason for Consult? bipolar      08/02/24 1711   07/24/24 1656  IP CONSULT TO PSYCHIATRY       Ordering Provider: Patsy Lenis, MD  Provider:  (Not yet assigned)  Question Answer Comment  Location [facility]   Reason for Consult? needing formal eval for capacity; may need guardianship given no family/support for discharge      07/24/24 1656   07/16/24 1412  IP CONSULT TO PSYCHIATRY       Ordering Provider: Dino Antu, MD  Provider:  (Not yet assigned)  Question Answer Comment  Location [facility]   Reason for Consult? 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  MDD, recurrent, severe, without psychotic  features 3.  Stimulant use disorder, in controlled setting Rule out traumatic brain injury from chronic subdural hematomas and neurosurgical procedures and complications Rule out bipolar disorder but likely previous manic episodes were meth induced per chart review  Assessment  Ernest Romero is a 58 y.o. male admitted: Medicallyfor 07/15/2024 11:15 PM for recurrent subdural hematoma. He carries the psychiatric diagnoses of bipolar disorder and alcohol use disorder and has a past medical history of subdural hematoma status postcraniotomy.   Current differential includes drug-induced parkinsonism as most likely issue that is slowly responding to medications.  DIP is most consistent with the characteristics of tremor including the frequency of the tremor being slower and consistent, being present at rest, and resolving with patient focusing on stopping it or being distracted.  Similarly to this there is some concern patient might have rapid syndrome which should be treated similarly to DIP.  Due to concern for drug-induced parkinsonism continuing benztropine  1 mg twice daily.  Furthermore there is concern for akathisia as patient has significant subjective anxiety and restlessness and some reports that akathisia can present in the perioral area similar to patient.  Patient has had questionable response to benzos and propranolol .  Plan to have primary team titrate propranolol  as tolerated for blood pressure so that this medication is present regardless and given it would help patient with his anxiety.  Furthermore plan to increase benzodiazepines as patient has  only been on a very small dose in order to rule out akathisia and help symptomatically while patient is given more time off antipsychotics.  Also some increasing concern for tardive tremor which is not characteristic of the tremor given these appear is more of a choreatic movements and less of a rhythmic movement but would warrant trialing with  Ingrezza  if not responding to current regimen and being off antipsychotics in a couple more days.  Lower suspicion is functional tremor.  Other causes are must rule out so we are planning to treat regardless of this.  Patient is noted to have this tremor even when he is not being watched.  This is diagnosis of exclusion.   Regarding next steps we will continue to monitor patient on higher dose of clonazepam  and continue the propranolol  and benztropine .  Furthermore we will talk with the nurse to see if patient is having the tremors in his sleep which would be more suggestive of tardive dyskinesia.  08/28/24 Still not having significant benefits from increased klonipin so will increase further and if not effective will d/c but other wise will maintain. If not working by tmrw will consider trial for ingrezza  if related to tardive dyskinesia. Mirtazapine  did not get contineud when pt transferred (overnight RN noticied) so we will reorder this as pt has found this helpful for sleep and tremor.    Diagnoses:  Active Hospital problems: Principal Problem:   Subdural hematoma (HCC) Active Problems:   Bipolar disorder (HCC)   Essential hypertension   Alcohol withdrawal (HCC)   Unable to make decisions about medical treatment due to impaired mental capacity   Protein-calorie malnutrition, severe   Acute metabolic encephalopathy   Infection of craniotomy plate    Plan   ## Psychiatric Medication Recommendations:  Continue mirtazapine  15 mg once daily at bedtime for akathisia, insomnia, mood; monitor for mania switching but has lower risk of manic switch than other antidepressant if true bipolar Continue propranolol  as can be helpful for akithisia if present, can help anxiety, and primary team can titrate for HTN Continue benztropine  1 mg twice daily for parkinsonism Increase clonazepam  to 1 mg 3 times daily for possible akathisia; per nursing pt had benefit from ativan  dose today Ativan  0.5 PRN  could potentially be d/c soon Consider pregabalin for TBI / anxiety in future Resolutions of symptoms can take up to 7 days so we will continue to monitor.  ## Medical Decision Making Capacity: Not specifically addressed in this encounter  ## Further Work-up:  -- TSH 1 month ago 0.775 While pt on Qtc prolonging medications, please monitor & replete K+ to 4 and Mg2+ to 2 -- most recent EKG on 11/19 had QtC of 414 -- Pertinent labwork reviewed earlier this admission includes: GFR greater than 60, LFT relatively within normal limits mildly elevated ALT, hemoglobin 11-12   ## Disposition:--Need to see persistent improvement in his tremor/severe anxiety before discharging as missing akathisia comes with much higher risk of suicide.  Once improving there is no psychiatric contraindications to discharge  ## Behavioral / Environmental: -Delirium Precautions: Delirium Interventions for Nursing and Staff: - RN to open blinds every AM. - To Bedside: Glasses, hearing aide, and pt's own shoes. Make available to patients. when possible and encourage use. - Encourage po fluids when appropriate, keep fluids within reach. - OOB to chair with meals. - Passive ROM exercises to all extremities with AM & PM care. - RN to assess orientation to person, time and place QAM and  PRN. - Recommend extended visitation hours with familiar family/friends as feasible. - Staff to minimize disturbances at night. Turn off television when pt asleep or when not in use.    ## Safety and Observation Level:  - Based on my clinical evaluation, I estimate the patient to be at low risk of self harm in the current setting. - At this time, we recommend  routine. This decision is based on my review of the chart including patient's history and current presentation, interview of the patient, mental status examination, and consideration of suicide risk including evaluating suicidal ideation, plan, intent, suicidal or self-harm behaviors, risk  factors, and protective factors. This judgment is based on our ability to directly address suicide risk, implement suicide prevention strategies, and develop a safety plan while the patient is in the clinical setting. Please contact our team if there is a concern that risk level has changed.  CSSR Risk Category:C-SSRS RISK CATEGORY: No Risk  Suicide Risk Assessment: Patient has following modifiable risk factors for suicide: untreated depression, which we are addressing by medication adjustments. Patient has following non-modifiable or demographic risk factors for suicide: Male and psychiatric hospitalization Patient has the following protective factors against suicide: n/a  Thank you for this consult request. Recommendations have been communicated to the primary team.  We will continue to follow at this time.   Justino Cornish, MD       History of Present Illness  Relevant Aspects of [facility] Course per hospitalist:  PCCM Xfer 44/30. 58 year old male, homeless, medical history significant for alcohol use disorder, bipolar and panic disorder, [facility] jail in September 2025 with a large subdural hematoma.  Underwent craniotomy with flap replacement on 10/27.  Since then multiple OR visits including 10/30 for [facility] evacuation, follow-up CT head 11/8 showed fluid collection underneath the bone flap, back to OR on 11/10 and the bone flap was removed, cultures were obtained, intraoperative cultures grew MRSA, treating for infected bone flap, ID on board and currently on IV vancomycin . Refer to PCCM progress note from 11/11 for detailed tabulation of hospital events until then.  Patient Report:  Patient reports that it is still severe and that he is thinking about harming himself because of how severe it is.  Reports that he is not sure exactly was helping but reports that the 1 he got late afternoon (propranolol ) was helpful for him.  Reports that he is not sure if he is  gotten better or worse since yesterday.  Reports he is unsure about this morning if the medicines have helped at all.  Says that they help for a very short period time and then they stop helping.  Reports that he is sleeping okay at night.  Reports he is having a lot of anxiety and restlessness.  Reports that tremors present at rest.  Reports that the tremor goes away whenever he focus on something.   Psych ROS:  Depression: Reports he has been having depressed mood, anhedonia, worthlessness, at lowest in his life, but denies suicidal thoughts but does have morbid rumination Anxiety: Reports that he is an anxious person but that the anxiety has been much worse since being the hospital.  Says that he feels restless and is having issues sleeping because of it.  Reports that he is anxious about his situation where he will go from here. Mania (lifetime and current): Endorses having manic episodes but when asking about these episodes he does not describe any increased goal-directed activity, denies  any decreased need for sleep, but does endorse feeling better, talking more, and impulsivity.  Denies having any dysfunction during these times.  Denies having any distractibility, grandiosity, flight of ideas, risky behaviors. Psychosis: (lifetime and current): Denies AVH or paranoia. Sleep: Reports that he cannot sleep with the shaking and in general   Psychiatric and Social History  Psychiatric History:  Information collected from patient and chart review  Prev Dx/Sx: Bipolar disorder, methamphetamine use, substance-induced mood disorder, alcohol use disorder Current Psych Provider: None Home Meds (current): Restarted on Latuda  40 and titrated to 80 this hospitalization, was not taking anything prior to admission in the short period that  He was away from hospital.  Also restarted on Klonopin  Previous Med Trials: Lithium , Klonopin , Latuda , Haldol  as needed for agitation Therapy: None  Prior Psych  Hospitalization: Previous hospitalizations although the psychiatric hospitalizations are not documented but the emergency department visits prior to have been documented and include episodes documented as mania but are occurring in the presence of positive amphetamine Prior Self Harm: Denies Prior Violence: Denies  Family Psych History: Denies Family Hx suicide: Denies  Social History:  Reports that he has been experiencing homelessness.  Does not report any recent incarceration which appears to conflict with previous chart review.  Does report he has a upcoming court date on December 7.  Denies access to weapons.  Reports that his hobby is making rock music.  Reports that he has a friend who is his international aid/development worker. Access to weapons/lethal means: Denies  Substance History Denies using any substances recently.  Has been in controlled setting.  Does have history of using stimulants but he denies when asked during interview.  Exam Findings  Physical Exam:  Vital Signs:  Temp:  [97.4 F (36.3 C)-97.8 F (36.6 C)] 97.8 F (36.6 C) (11/26 1159) Pulse Rate:  [57-72] 59 (11/26 1159) Resp:  [18-20] 20 (11/26 1159) BP: (86-121)/(60-92) 88/60 (11/26 1159) SpO2:  [98 %-100 %] 98 % (11/26 1159) Weight:  [66.1 kg] 66.1 kg (11/26 0500) Blood pressure (!) 88/60, pulse (!) 59, temperature 97.8 F (36.6 C), resp. rate 20, height 5' 10 (1.778 m), weight 66.1 kg, SpO2 98%. Body mass index is 20.91 kg/m.  Physical Exam Constitutional:      Comments: Chronic ill appearing  Pulmonary:     Effort: Pulmonary effort is normal.  Neurological:     Comments: No pain or stiffness with passive ROM of bilateral upper extremities.       Mental Status Exam: General Appearance: Patient is present in his room sitting in bed unable to move himself very well, blinds are closed, laying in clean gown, neurosurgical scar on head,  Orientation: Oriented to person place but not time  Memory: Did not formally  assess  Concentration: Did not formally assess but grossly intact  Recall: Grossly intact but did not formally assess  Attention grossly intact but not formally assessed  Eye Contact: Good  Speech: Fair, dysarthric  Language: Fair  Volume: Normal  Mood: Lowest point my life  Affect: Depressed, flat, anxious  Thought Process: Coherent linear  Thought Content: Logical.  Morbid ruminations.  Worried about current situation and future.  Suicidal Thoughts:  No  Homicidal Thoughts:  No  Judgement:  Fair  Insight:  Fair  Psychomotor Activity:  Increased, dyskinetic movements of mouth and hands.  With mouth movement is sliding jaw back and forth.  There is some voluntary inhibition but is overall involuntary when he is not overriding.  Akathisia:  Yes  Fund of Knowledge:  Fair      Assets:  Desire for Improvement  Cognition: Did not formally assess  ADL's: Impaired by patient's movement limitations but is improving  AIMS (if indicated):  0     Other History   These have been pulled in through the EMR, reviewed, and updated if appropriate.  Family History:  The patient's family history is not on file.  Medical History: Past Medical History:  Diagnosis Date   Anxiety    Bipolar 1 disorder (HCC)    Depression    Hepatitis    Hypertension    MRSA (methicillin resistant Staphylococcus aureus)    5/25 arm abscess and 11/25    Surgical History: Past Surgical History:  Procedure Laterality Date   BURR HOLE N/A 08/01/2024   Procedure: CREATION, CRANIAL BURR HOLE WITH EVACUATION OF HEMATOMA;  Surgeon: Rosslyn Dino HERO, MD;  Location: [facility] OR;  Service: Neurosurgery;  Laterality: N/A;   BURR HOLE N/A 08/12/2024   Procedure: left bone flap removal;  Surgeon: Rosslyn Dino HERO, MD;  Location: [facility] OR;  Service: Neurosurgery;  Laterality: N/A;   CRANIOPLASTY, WITH BONE FLAP REPLACEMENT Left 07/29/2024   Procedure: CRANIOPLASTY, WITH BONE FLAP REPLACEMENT;  Surgeon: Rosslyn Dino HERO, MD;   Location: [facility] OR;  Service: Neurosurgery;  Laterality: Left;  LEFT CRANIOPLASTY WITH ARTIFICAL BONE FLAP REPLACEMENT   CRANIOTOMY Left 06/30/2024   Procedure: CRANIOTOMY HEMATOMA EVACUATION SUBDURAL;  Surgeon: Melonie Grass, MD;  Location: [facility] ORS;  Service: Neurosurgery;  Laterality: Left;     Medications:   Current Facility-Administered Medications:    acetaminophen  (TYLENOL ) tablet 650 mg, 650 mg, Oral, Q4H PRN, 650 mg at 08/28/24 0626 **OR** [PENDING] acetaminophen  (TYLENOL ) tablet 650 mg, 650 mg, Per Tube, Q4H PRN **OR** acetaminophen  (TYLENOL ) suppository 650 mg, 650 mg, Rectal, Q4H PRN **OR** [PENDING] acetaminophen  (TYLENOL ) suppository 650 mg, 650 mg, Rectal, Q4H PRN,    benztropine  (COGENTIN ) tablet 1 mg, 1 mg, Oral, Q12H, Cornelius, Chatham Howington, MD, 1 mg at 08/28/24 0912   Chlorhexidine  Gluconate Cloth 2 % PADS 6 each, 6 each, Topical, Daily, Franky Redia SAILOR, MD, 6 each at 08/28/24 9082   clonazePAM  (KLONOPIN ) tablet 1 mg, 1 mg, Oral, TID, Yamin Swingler, MD, 1 mg at 08/28/24 9096   docusate (COLACE) 50 MG/5ML liquid 100 mg, 100 mg, Oral, Daily, Hongalgi, Anand D, MD, 100 mg at 08/28/24 0912   docusate sodium  (COLACE) capsule 100 mg, 100 mg, Oral, BID PRN, Autry, Lauren E, PA-C, 100 mg at 08/25/24 0847   feeding supplement (ENSURE PLUS HIGH PROTEIN) liquid 237 mL, 237 mL, Oral, BID BM, Danford, Christopher P, MD, 237 mL at 08/28/24 1329   folic acid  (FOLVITE ) tablet 1 mg, 1 mg, Oral, Daily, Rosario Eland I, MD, 1 mg at 08/28/24 9096   gabapentin  (NEURONTIN ) capsule 200 mg, 200 mg, Oral, TID, Danford, Lonni SQUIBB, MD, 200 mg at 08/28/24 0902   heparin  injection 5,000 Units, 5,000 Units, Subcutaneous, Q8H, Desai, Rahul P, PA-C, 5,000 Units at 08/28/24 1327   labetalol  (NORMODYNE ) injection 10-40 mg, 10-40 mg, Intravenous, Q10 min PRN, Janjua, Rashid M, MD   LORazepam  (ATIVAN ) injection 0.5 mg, 0.5 mg, Intravenous, Q6H PRN, Krishnan, Gokul, MD, 0.5 mg at 08/28/24 1324    melatonin tablet 10 mg, 10 mg, Oral, QHS PRN, Shalhoub, George J, MD, 10 mg at 08/26/24 2149   mirtazapine  (REMERON ) tablet 15 mg, 15 mg, Oral, QHS, Shearon Clonch, MD   multivitamin with minerals tablet 1 tablet, 1  tablet, Oral, Daily, Rosario Eland I, MD, 1 tablet at 08/28/24 9096   nicotine  (NICODERM CQ  - dosed in mg/24 hours) patch 14 mg, 14 mg, Transdermal, Daily, Fredia Dorothe HERO, MD, 14 mg at 08/28/24 9087   ondansetron  (ZOFRAN ) tablet 4 mg, 4 mg, Oral, Q4H PRN **OR** ondansetron  (ZOFRAN ) injection 4 mg, 4 mg, Intravenous, Q4H PRN, Janjua, Rashid M, MD, 4 mg at 08/01/24 1506   Oral care mouth rinse, 15 mL, Mouth Rinse, PRN, Mannam, Praveen, MD   pantoprazole  (PROTONIX ) EC tablet 40 mg, 40 mg, Oral, QHS, Hongalgi, Anand D, MD, 40 mg at 08/27/24 2002   polyethylene glycol (MIRALAX  / GLYCOLAX ) packet 17 g, 17 g, Oral, Daily, Desai, Rahul P, PA-C, 17 g at 08/28/24 9097   promethazine  (PHENERGAN ) tablet 12.5-25 mg, 12.5-25 mg, Oral, Q4H PRN, Janjua, Rashid M, MD   propranolol  (INDERAL ) tablet 20 mg, 20 mg, Oral, BID, Krishnan, Gokul, MD, 20 mg at 08/27/24 2002   senna (SENOKOT) tablet 17.2 mg, 2 tablet, Oral, Daily, Desai, Rahul P, PA-C, 17.2 mg at 08/28/24 0902   sodium chloride  flush (NS) 0.9 % injection 10-40 mL, 10-40 mL, Intracatheter, Q12H, Franky Redia SAILOR, MD, 10 mL at 08/27/24 2154   sodium chloride  flush (NS) 0.9 % injection 10-40 mL, 10-40 mL, Intracatheter, PRN, Franky Redia SAILOR, MD, 30 mL at 07/29/24 1135   thiamine  (VITAMIN B1) tablet 100 mg, 100 mg, Oral, Daily, Hongalgi, Anand D, MD, 100 mg at 08/28/24 0903   vancomycin  (VANCOCIN ) IVPB 1000 mg/200 mL premix, 1,000 mg, Intravenous, Q12H, Alveria Modest, [facility], Last Rate: 200 mL/hr at 08/28/24 1206, 1,000 mg at 08/28/24 1206  Allergies: No Known Allergies  Justino Cornish, MD

## 2024-08-28 NOTE — Plan of Care (Signed)
  Problem: Activity: Goal: Risk for activity intolerance will decrease Outcome: Progressing   Problem: Safety: Goal: Ability to remain free from injury will improve Outcome: Progressing   Problem: Skin Integrity: Goal: Demonstration of wound healing without infection will improve Outcome: Progressing

## 2024-08-29 MED ORDER — VALBENAZINE TOSYLATE 40 MG PO CAPS
80.0000 mg | ORAL_CAPSULE | Freq: Every day | ORAL | Status: DC
  Administered 2024-09-01 – 2024-09-05 (×5): 80 mg via ORAL
  Filled 2024-08-29 (×5): qty 2

## 2024-08-29 MED ORDER — VALBENAZINE TOSYLATE 40 MG PO CAPS: 40.0000 mg | ORAL_CAPSULE | Freq: Every day | ORAL | Status: DC

## 2024-08-29 MED ORDER — NYSTATIN 100000 UNIT/ML MT SUSP
5.0000 mL | Freq: Four times a day (QID) | OROMUCOSAL | Status: DC
  Administered 2024-08-29 – 2024-09-11 (×16): 500000 [IU] via ORAL
  Filled 2024-08-29 (×54): qty 5

## 2024-08-29 MED ORDER — MAGIC MOUTHWASH W/LIDOCAINE
5.0000 mL | Freq: Three times a day (TID) | ORAL | Status: AC | PRN
  Administered 2024-08-30 – 2024-09-03 (×6): 5 mL via ORAL
  Filled 2024-08-29 (×8): qty 5

## 2024-08-29 MED ORDER — VALBENAZINE TOSYLATE 40 MG PO CAPS
40.0000 mg | ORAL_CAPSULE | Freq: Every day | ORAL | Status: DC
Start: 2024-08-29 — End: 2024-08-29

## 2024-08-29 MED ORDER — VALBENAZINE TOSYLATE 40 MG PO CAPS
40.0000 mg | ORAL_CAPSULE | Freq: Every day | ORAL | Status: AC
  Administered 2024-08-29 – 2024-08-31 (×3): 40 mg via ORAL
  Filled 2024-08-29 (×3): qty 1

## 2024-08-29 NOTE — Plan of Care (Signed)
  Problem: Education: Goal: Knowledge of General Education information will improve Description: Including pain rating scale, medication(s)/side effects and non-pharmacologic comfort measures Outcome: Progressing   Problem: Clinical Measurements: Goal: Ability to maintain clinical measurements within normal limits will improve Outcome: Progressing Goal: Will remain free from infection Outcome: Progressing Goal: Diagnostic test results will improve Outcome: Progressing Goal: Respiratory complications will improve Outcome: Progressing Goal: Cardiovascular complication will be avoided Outcome: Progressing   Problem: Activity: Goal: Risk for activity intolerance will decrease Outcome: Progressing   Problem: Nutrition: Goal: Adequate nutrition will be maintained Outcome: Progressing   Problem: Coping: Goal: Level of anxiety will decrease Outcome: Progressing   Problem: Elimination: Goal: Will not experience complications related to bowel motility Outcome: Progressing Goal: Will not experience complications related to urinary retention Outcome: Progressing   Problem: Pain Managment: Goal: General experience of comfort will improve and/or be controlled Outcome: Progressing   Problem: Safety: Goal: Ability to remain free from injury will improve Outcome: Progressing   Problem: Skin Integrity: Goal: Risk for impaired skin integrity will decrease Outcome: Progressing   Problem: Education: Goal: Knowledge of the prescribed therapeutic regimen will improve Outcome: Progressing   Problem: Clinical Measurements: Goal: Usual level of consciousness will be regained or maintained. Outcome: Progressing Goal: Ability to maintain intracranial pressure will improve Outcome: Progressing   Problem: Skin Integrity: Goal: Demonstration of wound healing without infection will improve Outcome: Progressing   Problem: Health Behavior/Discharge Planning: Goal: Ability to manage  health-related needs will improve Outcome: Not Progressing   Problem: Clinical Measurements: Goal: Neurologic status will improve Outcome: Not Progressing

## 2024-08-29 NOTE — Plan of Care (Signed)
  Problem: Education: Goal: Knowledge of General Education information will improve Description: Including pain rating scale, medication(s)/side effects and non-pharmacologic comfort measures Outcome: Progressing   Problem: Health Behavior/Discharge Planning: Goal: Ability to manage health-related needs will improve Outcome: Progressing   Problem: Clinical Measurements: Goal: Ability to maintain clinical measurements within normal limits will improve Outcome: Progressing Goal: Will remain free from infection Outcome: Progressing Goal: Diagnostic test results will improve Outcome: Progressing Goal: Respiratory complications will improve Outcome: Progressing Goal: Cardiovascular complication will be avoided Outcome: Progressing   Problem: Activity: Goal: Risk for activity intolerance will decrease Outcome: Progressing   Problem: Nutrition: Goal: Adequate nutrition will be maintained Outcome: Progressing   Problem: Coping: Goal: Level of anxiety will decrease Outcome: Progressing   Problem: Elimination: Goal: Will not experience complications related to bowel motility Outcome: Progressing Goal: Will not experience complications related to urinary retention Outcome: Progressing   Problem: Pain Managment: Goal: General experience of comfort will improve and/or be controlled Outcome: Progressing   Problem: Safety: Goal: Ability to remain free from injury will improve Outcome: Progressing   Problem: Skin Integrity: Goal: Risk for impaired skin integrity will decrease Outcome: Progressing   Problem: Education: Goal: Knowledge of the prescribed therapeutic regimen will improve Outcome: Progressing   Problem: Clinical Measurements: Goal: Usual level of consciousness will be regained or maintained. Outcome: Progressing Goal: Neurologic status will improve Outcome: Progressing Goal: Ability to maintain intracranial pressure will improve Outcome: Progressing    Problem: Skin Integrity: Goal: Demonstration of wound healing without infection will improve Outcome: Progressing

## 2024-08-29 NOTE — Consult Note (Signed)
 [facility] Psychiatric Consult Follow-up  Patient Name: .Er[facility]  MRN: [id number]  DOB: 11/15/1965  Consult Order details:  Orders (From admission, onward)     Start     Ordered   08/22/24 0842  IP CONSULT TO PSYCHIATRY       Ordering Provider: Verdene Purchase, MD  Provider:  (Not yet assigned)  Question Answer Comment  Location [facility]   Reason for Consult? patient with h/o bipolar d/o. was on lithium . here with subdural hematoma and MRSA. improving. some facial trmors. Need help with medication management. he was on lithium , lurasidone . requiring haldol  prn.  thanks      08/22/24 0842   08/02/24 1712  IP CONSULT TO PSYCHIATRY       Ordering Provider: Adolph Tinnie FORBES, PA-C  Provider:  (Not yet assigned)  Question Answer Comment  Location [facility]   Reason for Consult? bipolar      08/02/24 1711   07/24/24 1656  IP CONSULT TO PSYCHIATRY       Ordering Provider: Patsy Lenis, MD  Provider:  (Not yet assigned)  Question Answer Comment  Location [facility]   Reason for Consult? needing formal eval for capacity; may need guardianship given no family/support for discharge      07/24/24 1656   07/16/24 1412  IP CONSULT TO PSYCHIATRY       Ordering Provider: Dino Antu, MD  Provider:  (Not yet assigned)  Question Answer Comment  Location [facility]   Reason for Consult? 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  MDD, recurrent, severe, without psychotic  features 3.  Stimulant use disorder, in controlled setting Rule out traumatic brain injury from chronic subdural hematomas and neurosurgical procedures and complications Rule out bipolar disorder but likely previous manic episodes were meth induced per chart review  Assessment  Ernest [facility] is a 58 y.o. male admitted: Medicallyfor 07/15/2024 11:15 PM for recurrent subdural hematoma. He carries the psychiatric diagnoses of bipolar disorder and alcohol use disorder and has a past medical history of subdural hematoma status postcraniotomy.   Current differential includes drug-induced parkinsonism as most likely issue that is slowly responding to medications.  DIP is most consistent with the characteristics of tremor including the frequency of the tremor being slower and consistent, being present at rest, and resolving with patient focusing on stopping it or being distracted.  Similarly to this there is some concern patient might have rapid syndrome which should be treated similarly to DIP.  Due to concern for drug-induced parkinsonism continuing benztropine  1 mg twice daily.  Furthermore there is concern for akathisia as patient has significant subjective anxiety and restlessness and some reports that akathisia can present in the perioral area similar to patient.  Patient has had questionable response to benzos and propranolol .  Plan to have primary team titrate propranolol  as tolerated for blood pressure so that this medication is present regardless and given it would help patient with his anxiety.  Furthermore plan to increase benzodiazepines as patient has  only been on a very small dose in order to rule out akathisia and help symptomatically while patient is given more time off antipsychotics.  Also some increasing concern for tardive tremor which is not characteristic of the tremor given these appear is more of a choreatic movements and less of a rhythmic movement but would warrant trialing with  Ingrezza  if not responding to current regimen and being off antipsychotics in a couple more days. Movement also disappears in sleep.   Lower suspicion is functional tremor.  Other causes are must rule out so we are planning to treat regardless of this.  Patient is noted to have this tremor even when he is not being watched.  This is diagnosis of exclusion.   Regarding next steps we will continue to monitor patient on higher dose of clonazepam  and continue the propranolol  and benztropine .  Furthermore we will talk with the nurse to see if patient is having the tremors in his sleep which would be more suggestive of tardive dyskinesia.  08/29/24 Still severe mouth issues. Clonazepam  2 oversedating so going back to 1 mg. Given ongoing concerns for possible tardive dyskinesia in differential we will start valbenazine  at 40 and titrate to 80 in three days and monitor if any improvement.    Diagnoses:  Active Romero problems: Principal Problem:   Subdural hematoma (HCC) Active Problems:   Bipolar disorder (HCC)   Essential hypertension   Alcohol withdrawal (HCC)   Unable to make decisions about medical treatment due to impaired mental capacity   Protein-calorie malnutrition, severe   Acute metabolic encephalopathy   Infection of craniotomy plate    Plan   ## Psychiatric Medication Recommendations:  Continue mirtazapine  15 mg once daily at bedtime for akathisia, insomnia, mood; monitor for mania switching but has tolerated >1 wk including benefit for jaw movement at night and sleep Continue propranolol  as can be helpful for akithisia if present, can help anxiety, and primary team can titrate for HTN Discontinue benztropine  due to lack of efficacy Decrease clonazepam  to 1 mg three times daily for possible akathisia Ativan  0.5 PRN could potentially be d/c soon Start valbenazine  40 mg for 3 days then increase to 80 mg (on 11/30) for possible tardive dyskinesia Consider pregabalin for TBI /  anxiety in future Resolutions of symptoms can take up to 7 days so we will continue to monitor.  ## Medical Decision Making Capacity: Not specifically addressed in this encounter  ## Further Work-up:  -- TSH 1 month ago 0.775 While pt on Qtc prolonging medications, please monitor & replete K+ to 4 and Mg2+ to 2 -- most recent EKG on 11/19 had QtC of 414 -- Pertinent labwork reviewed earlier this admission includes: GFR greater than 60, LFT relatively within normal limits mildly elevated ALT, hemoglobin 11-12  ## Disposition:--Need to see persistent improvement in his tremor/severe anxiety before discharging as missing akathisia comes with much higher risk of suicide.  Once improving there is no psychiatric contraindications to discharge  ## Behavioral / Environmental: -Delirium Precautions: Delirium Interventions for Nursing and Staff: - RN to open blinds every AM. - To Bedside: Glasses, hearing aide, and pt's own shoes. Make available to patients. when possible and encourage use. - Encourage po fluids when appropriate, keep fluids within reach. - OOB to chair with meals. - Passive ROM exercises to all extremities with AM & PM care. - RN to assess orientation to person, time and place QAM and PRN. - Recommend extended visitation hours with familiar family/friends as  feasible. - Staff to minimize disturbances at night. Turn off television when pt asleep or when not in use.    ## Safety and Observation Level:  - Based on my clinical evaluation, I estimate the patient to be at low risk of self harm in the current setting. - At this time, we recommend  routine. This decision is based on my review of the chart including patient's history and current presentation, interview of the patient, mental status examination, and consideration of suicide risk including evaluating suicidal ideation, plan, intent, suicidal or self-harm behaviors, risk factors, and protective factors. This judgment is based on our  ability to directly address suicide risk, implement suicide prevention strategies, and develop a safety plan while the patient is in the clinical setting. Please contact our team if there is a concern that risk level has changed.  CSSR Risk Category:C-SSRS RISK CATEGORY: No Risk  Suicide Risk Assessment: Patient has following modifiable risk factors for suicide: untreated depression, which we are addressing by medication adjustments. Patient has following non-modifiable or demographic risk factors for suicide: Male and psychiatric hospitalization Patient has the following protective factors against suicide: n/a  Thank you for this consult request. Recommendations have been communicated to the primary team.  We will continue to follow at this time.   Justino Cornish, MD       History of Present Illness  Relevant Aspects of [facility] Course per hospitalist:  PCCM Xfer 84/49. 58 year old male, homeless, medical history significant for alcohol use disorder, bipolar and panic disorder, [facility] jail in September 2025 with a large subdural hematoma.  Underwent craniotomy with flap replacement on 10/27.  Since then multiple OR visits including 10/30 for SDH evacuation, follow-up CT head 11/8 showed fluid collection underneath the bone flap, back to OR on 11/10 and the bone flap was removed, cultures were obtained, intraoperative cultures grew MRSA, treating for infected bone flap, ID on board and currently on IV vancomycin . Refer to PCCM progress note from 11/11 for detailed tabulation of Romero events until then.  Patient Report:  Reports continued mouth movement. Clonazepam  had questionable help. Very sedated on 2mg  doses. Discussed benefits and risks of valbenazine  and pt agreeable to start and titrating. Conitnue to monitor.    Psych ROS on initial assesment:  Depression: Reports he has been having depressed mood, anhedonia, worthlessness, at lowest in his  life, but denies suicidal thoughts but does have morbid rumination Anxiety: Reports that he is an anxious person but that the anxiety has been much worse since being the Romero.  Says that he feels restless and is having issues sleeping because of it.  Reports that he is anxious about his situation where he will go from here. Mania (lifetime and current): Endorses having manic episodes but when asking about these episodes he does not describe any increased goal-directed activity, denies any decreased need for sleep, but does endorse feeling better, talking more, and impulsivity.  Denies having any dysfunction during these times.  Denies having any distractibility, grandiosity, flight of ideas, risky behaviors. Psychosis: (lifetime and current): Denies AVH or paranoia. Sleep: Reports that he cannot sleep with the shaking and in general   Psychiatric and Social History  Psychiatric History:  Information collected from patient and chart review  Prev Dx/Sx: Bipolar disorder, methamphetamine use, substance-induced mood disorder, alcohol use disorder Current Psych Provider: None Home Meds (current): Restarted on Latuda  40 and titrated to 80 this hospitalization, was not taking anything prior to admission in  the short period that  He was away from Romero.  Also restarted on Klonopin  Previous Med Trials: Lithium , Klonopin , Latuda , Haldol  as needed for agitation Therapy: None  Prior Psych Hospitalization: Previous hospitalizations although the psychiatric hospitalizations are not documented but the emergency department visits prior to have been documented and include episodes documented as mania but are occurring in the presence of positive amphetamine Prior Self Harm: Denies Prior Violence: Denies  Family Psych History: Denies Family Hx suicide: Denies  Social History:  Reports that he has been experiencing homelessness.  Does not report any recent incarceration which appears to conflict with  previous chart review.  Does report he has a upcoming court date on December 7.  Denies access to weapons.  Reports that his hobby is making rock music.  Reports that he has a friend who is his international aid/development worker. Access to weapons/lethal means: Denies  Substance History Denies using any substances recently.  Has been in controlled setting.  Does have history of using stimulants but he denies when asked during interview.  Exam Findings  Physical Exam:  Vital Signs:  Temp:  [97.3 F (36.3 C)-97.9 F (36.6 C)] 97.9 F (36.6 C) (11/27 0318) Pulse Rate:  [59-70] 66 (11/27 0750) Resp:  [18-20] 18 (11/27 0750) BP: (79-119)/(47-86) 111/78 (11/27 0750) SpO2:  [96 %-100 %] 100 % (11/27 0750) Weight:  [66.2 kg] 66.2 kg (11/27 0500) Blood pressure 111/78, pulse 66, temperature 97.9 F (36.6 C), resp. rate 18, height 5' 10 (1.778 m), weight 66.2 kg, SpO2 100%. Body mass index is 20.94 kg/m.  Physical Exam Constitutional:      Comments: Chronic ill appearing  Pulmonary:     Effort: Pulmonary effort is normal.  Neurological:     Comments: No pain or stiffness with passive ROM of bilateral upper extremities.       Mental Status Exam: General Appearance: Patient is present in his room sitting in bed unable to move himself very well, blinds are closed, laying in clean gown, neurosurgical scar on head,  Orientation: Oriented to person place but not time  Memory: Did not formally assess  Concentration: Did not formally assess but grossly intact  Recall: Grossly intact but did not formally assess  Attention grossly intact but not formally assessed  Eye Contact: Good  Speech: Fair, dysarthric  Language: Fair  Volume: Normal  Mood: Lowest point my life  Affect: Depressed, flat, anxious  Thought Process: Coherent linear  Thought Content: Logical.  Morbid ruminations.  Worried about current situation and future.  Suicidal Thoughts:  No  Homicidal Thoughts:  No  Judgement:  Fair  Insight:   Fair  Psychomotor Activity:  Increased, dyskinetic movements of mouth and hands.  With mouth movement is sliding jaw back and forth.  There is some voluntary inhibition but is overall involuntary when he is not overriding.   Akathisia:  Yes  Fund of Knowledge:  Fair      Assets:  Desire for Improvement  Cognition: Did not formally assess  ADL's: Impaired by patient's movement limitations but is improving  AIMS (if indicated):  0     Other History   These have been pulled in through the EMR, reviewed, and updated if appropriate.  Family History:  The patient's family history is not on file.  Medical History: Past Medical History:  Diagnosis Date   Anxiety    Bipolar 1 disorder (HCC)    Depression    Hepatitis    Hypertension    MRSA (methicillin  resistant Staphylococcus aureus)    5/25 arm abscess and 11/25    Surgical History: Past Surgical History:  Procedure Laterality Date   BURR HOLE N/A 08/01/2024   Procedure: CREATION, CRANIAL BURR HOLE WITH EVACUATION OF HEMATOMA;  Surgeon: Rosslyn Dino HERO, MD;  Location: [facility] OR;  Service: Neurosurgery;  Laterality: N/A;   BURR HOLE N/A 08/12/2024   Procedure: left bone flap removal;  Surgeon: Rosslyn Dino HERO, MD;  Location: [facility] OR;  Service: Neurosurgery;  Laterality: N/A;   CRANIOPLASTY, WITH BONE FLAP REPLACEMENT Left 07/29/2024   Procedure: CRANIOPLASTY, WITH BONE FLAP REPLACEMENT;  Surgeon: Rosslyn Dino HERO, MD;  Location: [facility] OR;  Service: Neurosurgery;  Laterality: Left;  LEFT CRANIOPLASTY WITH ARTIFICAL BONE FLAP REPLACEMENT   CRANIOTOMY Left 06/30/2024   Procedure: CRANIOTOMY HEMATOMA EVACUATION SUBDURAL;  Surgeon: Melonie Grass, MD;  Location: [facility] ORS;  Service: Neurosurgery;  Laterality: Left;     Medications:   Current Facility-Administered Medications:    acetaminophen  (TYLENOL ) tablet 650 mg, 650 mg, Oral, Q4H PRN, 650 mg at 08/28/24 0626 **OR** [PENDING] acetaminophen  (TYLENOL ) tablet 650 mg, 650 mg, Per  Tube, Q4H PRN **OR** acetaminophen  (TYLENOL ) suppository 650 mg, 650 mg, Rectal, Q4H PRN **OR** [PENDING] acetaminophen  (TYLENOL ) suppository 650 mg, 650 mg, Rectal, Q4H PRN,    Chlorhexidine  Gluconate Cloth 2 % PADS 6 each, 6 each, Topical, Daily, Franky Redia SAILOR, MD, 6 each at 08/28/24 9082   clonazePAM  (KLONOPIN ) tablet 2 mg, 2 mg, Oral, TID, Allessandra Bernardi, MD, 2 mg at 08/29/24 0749   docusate (COLACE) 50 MG/5ML liquid 100 mg, 100 mg, Oral, Daily, Hongalgi, Anand D, MD, 100 mg at 08/28/24 9087   docusate sodium  (COLACE) capsule 100 mg, 100 mg, Oral, BID PRN, Autry, Lauren E, PA-C, 100 mg at 08/25/24 0847   feeding supplement (ENSURE PLUS HIGH PROTEIN) liquid 237 mL, 237 mL, Oral, BID BM, Danford, Lonni SQUIBB, MD, 237 mL at 08/28/24 1329   folic acid  (FOLVITE ) tablet 1 mg, 1 mg, Oral, Daily, Rosario Eland I, MD, 1 mg at 08/28/24 9096   gabapentin  (NEURONTIN ) capsule 200 mg, 200 mg, Oral, TID, Danford, Lonni SQUIBB, MD, 200 mg at 08/28/24 2113   guaiFENesin -dextromethorphan (ROBITUSSIN DM) 100-10 MG/5ML syrup 5 mL, 5 mL, Oral, Q4H PRN, Pudota, Ellouise SQUIBB, MD   heparin  injection 5,000 Units, 5,000 Units, Subcutaneous, Q8H, Desai, Rahul P, PA-C, 5,000 Units at 08/29/24 9462   labetalol  (NORMODYNE ) injection 10-40 mg, 10-40 mg, Intravenous, Q10 min PRN, Janjua, Rashid M, MD   LORazepam  (ATIVAN ) injection 0.5 mg, 0.5 mg, Intravenous, Q6H PRN, Krishnan, Gokul, MD, 0.5 mg at 08/29/24 0320   melatonin tablet 10 mg, 10 mg, Oral, QHS PRN, Shalhoub, George J, MD, 10 mg at 08/26/24 2149   mirtazapine  (REMERON ) tablet 15 mg, 15 mg, Oral, QHS, Anarie Kalish, MD, 15 mg at 08/28/24 2113   multivitamin with minerals tablet 1 tablet, 1 tablet, Oral, Daily, Rosario Eland I, MD, 1 tablet at 08/28/24 9096   nicotine  (NICODERM CQ  - dosed in mg/24 hours) patch 14 mg, 14 mg, Transdermal, Daily, Fredia Dorothe HERO, MD, 14 mg at 08/28/24 0912   nystatin  (MYCOSTATIN ) 100000 UNIT/ML suspension 500,000 Units, 5  mL, Oral, QID, Regalado, Belkys A, MD   ondansetron  (ZOFRAN ) tablet 4 mg, 4 mg, Oral, Q4H PRN **OR** ondansetron  (ZOFRAN ) injection 4 mg, 4 mg, Intravenous, Q4H PRN, Janjua, Rashid M, MD, 4 mg at 08/01/24 1506   Oral care mouth rinse, 15 mL, Mouth Rinse, PRN, Mannam, Praveen, MD   pantoprazole  (PROTONIX )  EC tablet 40 mg, 40 mg, Oral, QHS, Hongalgi, Anand D, MD, 40 mg at 08/28/24 2113   polyethylene glycol (MIRALAX  / GLYCOLAX ) packet 17 g, 17 g, Oral, Daily, Desai, Rahul P, PA-C, 17 g at 08/28/24 9097   promethazine  (PHENERGAN ) tablet 12.5-25 mg, 12.5-25 mg, Oral, Q4H PRN, Janjua, Rashid M, MD   propranolol  (INDERAL ) tablet 20 mg, 20 mg, Oral, BID, Krishnan, Gokul, MD, 20 mg at 08/28/24 2113   senna (SENOKOT) tablet 17.2 mg, 2 tablet, Oral, Daily, Desai, Rahul P, PA-C, 17.2 mg at 08/28/24 0902   sodium chloride  flush (NS) 0.9 % injection 10-40 mL, 10-40 mL, Intracatheter, Q12H, Franky Redia SAILOR, MD, 10 mL at 08/28/24 2114   sodium chloride  flush (NS) 0.9 % injection 10-40 mL, 10-40 mL, Intracatheter, PRN, Franky Redia SAILOR, MD, 30 mL at 07/29/24 1135   thiamine  (VITAMIN B1) tablet 100 mg, 100 mg, Oral, Daily, Hongalgi, Anand D, MD, 100 mg at 08/28/24 9096   valbenazine  (INGREZZA ) capsule 40 mg, 40 mg, Oral, Daily, Jennea Rager, MD   NOREEN ON 09/01/2024] valbenazine  (INGREZZA ) capsule 80 mg, 80 mg, Oral, Daily, Lorana Maffeo, MD   vancomycin  (VANCOCIN ) IVPB 1000 mg/200 mL premix, 1,000 mg, Intravenous, Q12H, Alveria Modest, [facility], Last Rate: 200 mL/hr at 08/28/24 2117, 1,000 mg at 08/28/24 2117  Allergies: No Known Allergies  Justino Cornish, MD

## 2024-08-29 NOTE — Progress Notes (Addendum)
 PROGRESS NOTE    Ernest Romero  FMW:[id number] DOB: 1966-09-17 DOA: 07/15/2024 PCP: [name]   Brief Narrative: 58 year old homeless, medical history significant for alcohol use disorder, bipolar and panic disorder, initially presented to [facility] from jail in September 2025 with a large subdural hematoma.  Underwent craniotomy with flap replacement on 10/27.  Since then multiple OR visits including 10/30 for [facility] evacuation, follow-up CT head 11/8 showed fluid collection underneath the bone flap, back to the OR 11/10 and then bone flap was removed, cultures were obtained intraoperative growing MRSA, treating for infected bone flap, ID following and currently on IV vancomycin .  10/27 crani w/ flap replacement 10/29: off clevi, liberate SBP goal, Klonopin , lithium , Haldol  as needed discontinued.  Latuda  dose cut by half 10/30: repeat CT head with increased collection - going to OR this afternoon for evacuation 10/31: OR  for [facility] evacuation.  Developed arm twitching.  Klonopin  restarted at half dose 11/1: More awake.  No complaint 11/2 transferred out of ICU. 11/4 cortrak removed 11/8 CT head showed fluid collection under bone flap, decision made to remove bone flap 11/10 to OR for L bone flap removal 11/11 repeat CT head stable  Assessment & Plan:   Principal Problem:   Subdural hematoma (HCC) Active Problems:   Bipolar disorder (HCC)   Unable to make decisions about medical treatment due to impaired mental capacity   Alcohol withdrawal (HCC)   Essential hypertension   Protein-calorie malnutrition, severe   Acute metabolic encephalopathy   Infection of craniotomy plate  1-Subdural hematoma Status post initial craniotomy 9/28 (Dr. 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 - Followed by neurosurgery.  Likely -Evaluated by infectious disease who recommends 6  weeks of IV vancomycin  and day 12/20-second/2025.  After completion of IV antibiotics he will need 8 weeks of doxycycline . - Will need PICC line placed closer to discharge date. - Completed 1 week of Keppra  - Sutures should come out December 1 Therapy recommended SNF, TOC following   2-Acute metabolic encephalopathy - At baseline, patient reportedly without cognitive impairment, was homeless and independent caring for self. - At some point during this hospitalization patient was deemed not to have capacity, due to encephalopathy and neurological deficits. - Per recent physicians notes patient appears to be able to make decision for himself moving forward.  3-Bipolar disorder/akathisia/drug-induced parkinsonism: - Patient with long standing history of bipolar disorder. - Patient was on Haldol  and Latuda , developed facial tremors 11/20 concern for tardive dyskinesia. - EEG negative for seizures. - Latuda  and Haldol  discontinued. Patient was started on propranolol  and Ativan  as needed. Psych following and adjusting medication. Report jaw tremors improving.  On Ingrezza . Klonopin    4-history of alcohol, tobacco use disorder: - Continue thiamine , folic acid  and multivitamin Treated for alcohol withdrawal delirium with phenobarbital  taper at [facility]. Resolved  Essential hypertension: -Blood pressure decrease with propranolol .  Amlodipine  and losartan  has been discontinued. Propranolol  dose decreased to avoid hypotension  Constipation: Continue laxatives.   Severe malnutrition: Continue ensure.   Oral Thrush; started nystatin    Nutrition Problem: Severe Malnutrition Etiology: acute illness    Signs/Symptoms: energy intake < or equal to 50% for > or equal to 5 days, severe muscle depletion, moderate fat depletion    Interventions: Tube feeding  Estimated body mass index is 20.94 kg/m as calculated from the following:   Height as of this  encounter: 5' 10 (1.778 m).   Weight  as of this encounter: 66.2 kg.   DVT prophylaxis: Heparin   Code Status: Full code Family Communication: care discussed with patient.  Disposition Plan:  Status is: Inpatient Remains inpatient appropriate because: awaiting placement.     Consultants:  Psych ID CCM Neurosurgery     Antimicrobials:  Vancomycin    Subjective: He is sleepy, wake up to voice. Denies any new complaints. Think jaw tremors have improved.   Objective: Vitals:   08/28/24 2012 08/29/24 0104 08/29/24 0318 08/29/24 0500  BP: 112/82 (!) 89/67 119/86   Pulse: 67 64 69   Resp:  20 18   Temp:  97.7 F (36.5 C) 97.9 F (36.6 C)   TempSrc:      SpO2:  96% 100%   Weight:    66.2 kg  Height:        Intake/Output Summary (Last 24 hours) at 08/29/2024 0655 Last data filed at 08/28/2024 1802 Gross per 24 hour  Intake 772 ml  Output --  Net 772 ml   Filed Weights   08/26/24 0500 08/28/24 0500 08/29/24 0500  Weight: 66 kg 66.1 kg 66.2 kg    Examination:  General exam: Appears calm and comfortable  Respiratory system: Clear to auscultation. Respiratory effort normal. Cardiovascular system: S1 & S2 heard, RRR. No JVD, murmurs, rubs, gallops or clicks. No pedal edema. Gastrointestinal system: Abdomen is nondistended, soft and nontender. No organomegaly or masses felt. Normal bowel sounds heard. Central nervous system: Alert Extremities: no edema  Data Reviewed: I have personally reviewed following labs and imaging studies  CBC: Recent Labs  Lab 08/23/24 0134 08/28/24 0348  WBC 7.6 6.1  HGB 12.4* 11.9*  HCT 38.1* 36.5*  MCV 89.9 90.8  PLT 334 276   Basic Metabolic Panel: Recent Labs  Lab 08/23/24 0134 08/25/24 1235 08/26/24 0547 08/28/24 0348  NA 137 140 142 142  K 4.0 4.1 4.3 4.3  CL 101 104 106 106  CO2 27 24 25 27   GLUCOSE 98 99 111* 108*  BUN 22* 24* 21* 19  CREATININE 0.83 0.93 0.87 1.08  CALCIUM 9.4 9.3 9.5 9.5  MG 2.2  --  2.2  --   PHOS  --   --  3.9  --     GFR: Estimated Creatinine Clearance: 68.1 mL/min (by C-G formula based on SCr of 1.08 mg/dL). Liver Function Tests: Recent Labs  Lab 08/23/24 0134 08/28/24 0348  AST 23 26  ALT 38 40  ALKPHOS 73 69  BILITOT 0.6 0.7  PROT 6.5 6.1*  ALBUMIN  3.2* 3.1*   No results for input(s): LIPASE, AMYLASE in the last 168 hours. No results for input(s): AMMONIA in the last 168 hours. Coagulation Profile: No results for input(s): INR, PROTIME in the last 168 hours. Cardiac Enzymes: No results for input(s): CKTOTAL, CKMB, CKMBINDEX, TROPONINI in the last 168 hours. BNP (last 3 results) No results for input(s): PROBNP in the last 8760 hours. HbA1C: No results for input(s): HGBA1C in the last 72 hours. CBG: No results for input(s): GLUCAP in the last 168 hours. Lipid Profile: No results for input(s): CHOL, HDL, LDLCALC, TRIG, CHOLHDL, LDLDIRECT in the last 72 hours. Thyroid Function Tests: No results for input(s): TSH, T4TOTAL, FREET4, T3FREE, THYROIDAB in the last 72 hours. Anemia Panel: No results for input(s): VITAMINB12, FOLATE, FERRITIN, TIBC, IRON, RETICCTPCT in the last 72 hours. Sepsis Labs: No results for input(s): PROCALCITON, LATICACIDVEN in the last 168 hours.  No results found  for this or any previous visit (from the past 240 hours).       Radiology Studies: No results found.      Scheduled Meds:  benztropine   1 mg Oral Q12H   Chlorhexidine  Gluconate Cloth  6 each Topical Daily   clonazePAM   2 mg Oral TID   docusate  100 mg Oral Daily   feeding supplement  237 mL Oral BID BM   folic acid   1 mg Oral Daily   gabapentin   200 mg Oral TID   heparin  injection (subcutaneous)  5,000 Units Subcutaneous Q8H   mirtazapine   15 mg Oral QHS   multivitamin with minerals  1 tablet Oral Daily   nicotine   14 mg Transdermal Daily   pantoprazole   40 mg Oral QHS   polyethylene glycol  17 g Oral Daily   propranolol   20  mg Oral BID   senna  2 tablet Oral Daily   sodium chloride  flush  10-40 mL Intracatheter Q12H   thiamine   100 mg Oral Daily   Continuous Infusions:  vancomycin  1,000 mg (08/28/24 2117)     LOS: 45 days    Time spent: 35 Minutes    Ernest Villada A Kipp Shank, MD [facility]   If 7PM-7AM, please contact night-coverage [email or web address]  08/29/2024, 6:55 AM

## 2024-08-30 LAB — VITAMIN D 25 HYDROXY (VIT D DEFICIENCY, FRACTURES): Vit D, 25-Hydroxy: 45.66 ng/mL (ref 30–100)

## 2024-08-30 LAB — IRON AND TIBC
Iron: 36 ug/dL — ABNORMAL LOW (ref 45–182)
Saturation Ratios: 8 % — ABNORMAL LOW (ref 17.9–39.5)
TIBC: 434 ug/dL (ref 250–450)
UIBC: 398 ug/dL

## 2024-08-30 LAB — VITAMIN B12: Vitamin B-12: 460 pg/mL (ref 180–914)

## 2024-08-30 LAB — TSH: TSH: 1.79 u[IU]/mL (ref 0.350–4.500)

## 2024-08-30 LAB — RETICULOCYTES
Immature Retic Fract: 15.7 % (ref 2.3–15.9)
RBC.: 4.02 MIL/uL — ABNORMAL LOW (ref 4.22–5.81)
Retic Count, Absolute: 78.8 K/uL (ref 19.0–186.0)
Retic Ct Pct: 2 % (ref 0.4–3.1)

## 2024-08-30 LAB — BASIC METABOLIC PANEL WITH GFR
Anion gap: 11 (ref 5–15)
BUN: 20 mg/dL (ref 6–20)
CO2: 26 mmol/L (ref 22–32)
Calcium: 9.6 mg/dL (ref 8.9–10.3)
Chloride: 107 mmol/L (ref 98–111)
Creatinine, Ser: 1.07 mg/dL (ref 0.61–1.24)
GFR, Estimated: >60 mL/min (ref >60)
Glucose, Bld: 96 mg/dL (ref 70–99)
Potassium: 4.3 mmol/L (ref 3.5–5.1)
Sodium: 144 mmol/L (ref 135–145)

## 2024-08-30 LAB — FOLATE: Folate: >20 ng/mL (ref >5.9)

## 2024-08-30 LAB — FERRITIN: Ferritin: 13 ng/mL — ABNORMAL LOW (ref 24–336)

## 2024-08-30 LAB — VANCOMYCIN, PEAK: Vancomycin Pk: 29 ug/mL — ABNORMAL LOW (ref 30–40)

## 2024-08-30 LAB — VANCOMYCIN, TROUGH: Vancomycin Tr: 20 ug/mL (ref 15–20)

## 2024-08-30 MED ORDER — FERROUS SULFATE 325 (65 FE) MG PO TABS
325.0000 mg | ORAL_TABLET | Freq: Every day | ORAL | Status: AC
  Administered 2024-08-31 – 2024-11-08 (×69): 325 mg via ORAL
  Filled 2024-08-30 (×61): qty 1

## 2024-08-30 MED ORDER — SODIUM CHLORIDE 0.9 % IV SOLN: INTRAVENOUS | Status: AC

## 2024-08-30 NOTE — Plan of Care (Signed)
  Problem: Education: Goal: Knowledge of General Education information will improve Description: Including pain rating scale, medication(s)/side effects and non-pharmacologic comfort measures Outcome: Progressing   Problem: Health Behavior/Discharge Planning: Goal: Ability to manage health-related needs will improve Outcome: Progressing   Problem: Clinical Measurements: Goal: Ability to maintain clinical measurements within normal limits will improve Outcome: Progressing Goal: Will remain free from infection Outcome: Progressing Goal: Diagnostic test results will improve Outcome: Progressing Goal: Respiratory complications will improve Outcome: Progressing Goal: Cardiovascular complication will be avoided Outcome: Progressing   Problem: Activity: Goal: Risk for activity intolerance will decrease Outcome: Progressing   Problem: Nutrition: Goal: Adequate nutrition will be maintained Outcome: Progressing   Problem: Coping: Goal: Level of anxiety will decrease Outcome: Progressing   Problem: Elimination: Goal: Will not experience complications related to bowel motility Outcome: Progressing Goal: Will not experience complications related to urinary retention Outcome: Progressing   Problem: Pain Managment: Goal: General experience of comfort will improve and/or be controlled Outcome: Progressing   Problem: Safety: Goal: Ability to remain free from injury will improve Outcome: Progressing   Problem: Skin Integrity: Goal: Risk for impaired skin integrity will decrease Outcome: Progressing   Problem: Education: Goal: Knowledge of the prescribed therapeutic regimen will improve Outcome: Progressing   Problem: Clinical Measurements: Goal: Usual level of consciousness will be regained or maintained. Outcome: Progressing Goal: Neurologic status will improve Outcome: Progressing Goal: Ability to maintain intracranial pressure will improve Outcome: Progressing    Problem: Skin Integrity: Goal: Demonstration of wound healing without infection will improve Outcome: Progressing

## 2024-08-30 NOTE — Progress Notes (Addendum)
 PROGRESS NOTE    Ernest Romero  FMW:[id number] DOB: 1966-05-02 DOA: 07/15/2024 PCP: [name]   Brief Narrative: 58 year old homeless, medical history significant for alcohol use disorder, bipolar and panic disorder, initially presented to [facility] from jail in September 2025 with a large subdural hematoma.  Underwent craniotomy with flap replacement on 10/27.  Since then multiple OR visits including 10/30 for [facility] evacuation, follow-up CT head 11/8 showed fluid collection underneath the bone flap, back to the OR 11/10 and then bone flap was removed, cultures were obtained intraoperative growing MRSA, treating for infected bone flap, ID following and currently on IV vancomycin .  10/27 crani w/ flap replacement 10/29: off clevi, liberate SBP goal, Klonopin , lithium , Haldol  as needed discontinued.  Latuda  dose cut by half 10/30: repeat CT head with increased collection - going to OR this afternoon for evacuation 10/31: OR  for [facility] evacuation.  Developed arm twitching.  Klonopin  restarted at half dose 11/1: More awake.  No complaint 11/2 transferred out of ICU. 11/4 cortrak removed 11/8 CT head showed fluid collection under bone flap, decision made to remove bone flap 11/10 to OR for L bone flap removal 11/11 repeat CT head stable  Assessment & Plan:   Principal Problem:   Subdural hematoma (HCC) Active Problems:   Bipolar disorder (HCC)   Unable to make decisions about medical treatment due to impaired mental capacity   Alcohol withdrawal (HCC)   Essential hypertension   Protein-calorie malnutrition, severe   Acute metabolic encephalopathy   Infection of craniotomy plate  1-Subdural hematoma Status post initial craniotomy 9/28 (Dr. 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 - Followed by neurosurgery.  Likely -Evaluated by infectious disease who recommends 6  weeks of IV vancomycin  and day 12/20-second/2025.  After completion of IV antibiotics he will need 8 weeks of doxycycline . - Will need PICC line placed closer to discharge date. - Completed 1 week of Keppra  - Sutures should come out December 1 Therapy recommended SNF, TOC following   2-Acute metabolic encephalopathy - At baseline, patient reportedly without cognitive impairment, was homeless and independent caring for self. - At some point during this hospitalization patient was deemed not to have capacity, due to encephalopathy and neurological deficits. - Per recent physicians notes patient appears to be able to make decision for himself moving forward.  3-Bipolar disorder/akathisia/drug-induced parkinsonism: - Patient with long standing history of bipolar disorder. - Patient was on Haldol  and Latuda , developed facial tremors 11/20 concern for tardive dyskinesia. - EEG negative for seizures. - Latuda  and Haldol  discontinued. Patient was started on propranolol  and Ativan  as needed. Psych following and adjusting medication. Report jaw tremors improving.  On Ingrezza . Klonopin   Psych started patient on Valbezine, if not helping by 09/04/2024 psych will consider to discontinue it.   4-history of alcohol, tobacco use disorder: - Continue thiamine , folic acid  and multivitamin Treated for alcohol withdrawal delirium with phenobarbital  taper at [facility]. Resolved  Essential hypertension: -Blood pressure decrease with propranolol .  Amlodipine  and losartan  has been discontinued. Propranolol  dose decreased to avoid hypotension  Constipation: Continue laxatives.   Severe malnutrition: Continue ensure.   Iron deficiency anemia; start iron supplement.   Oral Thrush; started nystatin    Nutrition Problem: Severe Malnutrition Etiology: acute illness    Signs/Symptoms: energy intake < or equal to 50% for > or equal to 5 days, severe muscle depletion, moderate fat  depletion    Interventions: Tube feeding  Estimated body mass index is 20.88 kg/m as calculated from the following:   Height as of this encounter: 5' 10 (1.778 m).   Weight as of this encounter: 66 kg.   DVT prophylaxis: Heparin   Code Status: Full code Family Communication: care discussed with patient.  Disposition Plan:  Status is: Inpatient Remains inpatient appropriate because: awaiting placement.     Consultants:  Psych ID CCM Neurosurgery     Antimicrobials:  Vancomycin    Subjective: He is alert, he required sitter today, has been agitated, frequently trying to get out bed.   Objective: Vitals:   08/29/24 1930 08/30/24 0400 08/30/24 0500 08/30/24 0840  BP: 108/68 126/86  (!) 107/93  Pulse: (!) 56 60  63  Resp: 18 18  17   Temp: 97.7 F (36.5 C) 97.9 F (36.6 C)  (!) 97.3 F (36.3 C)  TempSrc: Axillary Axillary  Oral  SpO2: 100% 98%  98%  Weight:   66 kg   Height:        Intake/Output Summary (Last 24 hours) at 08/30/2024 1223 Last data filed at 08/30/2024 0200 Gross per 24 hour  Intake --  Output 300 ml  Net -300 ml   Filed Weights   08/28/24 0500 08/29/24 0500 08/30/24 0500  Weight: 66.1 kg 66.2 kg 66 kg    Examination:  General exam: NAD Respiratory system: CTA Cardiovascular system: S 1, S2 RRR Gastrointestinal system: BS present, soft, nt Central nervous system: alert Extremities: no edema  Data Reviewed: I have personally reviewed following labs and imaging studies  CBC: Recent Labs  Lab 08/28/24 0348  WBC 6.1  HGB 11.9*  HCT 36.5*  MCV 90.8  PLT 276   Basic Metabolic Panel: Recent Labs  Lab 08/25/24 1235 08/26/24 0547 08/28/24 0348 08/30/24 0602  NA 140 142 142 144  K 4.1 4.3 4.3 4.3  CL 104 106 106 107  CO2 24 25 27 26   GLUCOSE 99 111* 108* 96  BUN 24* 21* 19 20  CREATININE 0.93 0.87 1.08 1.07  CALCIUM 9.3 9.5 9.5 9.6  MG  --  2.2  --   --   PHOS  --  3.9  --   --    GFR: Estimated Creatinine Clearance:  68.5 mL/min (by C-G formula based on SCr of 1.07 mg/dL). Liver Function Tests: Recent Labs  Lab 08/28/24 0348  AST 26  ALT 40  ALKPHOS 69  BILITOT 0.7  PROT 6.1*  ALBUMIN  3.1*   No results for input(s): LIPASE, AMYLASE in the last 168 hours. No results for input(s): AMMONIA in the last 168 hours. Coagulation Profile: No results for input(s): INR, PROTIME in the last 168 hours. Cardiac Enzymes: No results for input(s): CKTOTAL, CKMB, CKMBINDEX, TROPONINI in the last 168 hours. BNP (last 3 results) No results for input(s): PROBNP in the last 8760 hours. HbA1C: No results for input(s): HGBA1C in the last 72 hours. CBG: No results for input(s): GLUCAP in the last 168 hours. Lipid Profile: No results for input(s): CHOL, HDL, LDLCALC, TRIG, CHOLHDL, LDLDIRECT in the last 72 hours. Thyroid Function Tests: Recent Labs    08/30/24 0602  TSH 1.790   Anemia Panel: Recent Labs    08/30/24 0602  VITAMINB12 460  FOLATE >20.0  FERRITIN 13*  TIBC 434  IRON 36*  RETICCTPCT 2.0   Sepsis Labs: No results for input(s): PROCALCITON, LATICACIDVEN in the last 168 hours.  No results found for this or any  previous visit (from the past 240 hours).       Radiology Studies: No results found.      Scheduled Meds:  Chlorhexidine  Gluconate Cloth  6 each Topical Daily   clonazePAM   2 mg Oral TID   docusate  100 mg Oral Daily   feeding supplement  237 mL Oral BID BM   [START ON 08/31/2024] ferrous sulfate  325 mg Oral Q breakfast   folic acid   1 mg Oral Daily   gabapentin   200 mg Oral TID   heparin  injection (subcutaneous)  5,000 Units Subcutaneous Q8H   mirtazapine   15 mg Oral QHS   multivitamin with minerals  1 tablet Oral Daily   nicotine   14 mg Transdermal Daily   nystatin   5 mL Oral QID   pantoprazole   40 mg Oral QHS   polyethylene glycol  17 g Oral Daily   propranolol   20 mg Oral BID   senna  2 tablet Oral Daily   sodium  chloride flush  10-40 mL Intracatheter Q12H   thiamine   100 mg Oral Daily   valbenazine   40 mg Oral Daily   [START ON 09/01/2024] valbenazine   80 mg Oral Daily   Continuous Infusions:  vancomycin  1,000 mg (08/30/24 0959)     LOS: 46 days    Time spent: 35 Minutes    Dane Kopke A Abhi Moccia, MD [facility]   If 7PM-7AM, please contact night-coverage [email or web address]  08/30/2024, 12:23 PM

## 2024-08-30 NOTE — Consult Note (Cosign Needed Addendum)
 [facility] Psychiatric Consult Follow-up  Patient Name: .Ernest Romero  MRN: [id number]  DOB: Jan 30, 1966  Consult Order details:  Orders (From admission, onward)     Start     Ordered   08/22/24 0842  IP CONSULT TO PSYCHIATRY       Ordering Provider: Verdene Purchase, MD  Provider:  (Not yet assigned)  Question Answer Comment  Location [facility]   Reason for Consult? patient with h/o bipolar d/o. was on lithium . here with subdural hematoma and MRSA. improving. some facial trmors. Need help with medication management. he was on lithium , lurasidone . requiring haldol  prn.  thanks      08/22/24 0842   08/02/24 1712  IP CONSULT TO PSYCHIATRY       Ordering Provider: Adolph Tinnie FORBES, PA-C  Provider:  (Not yet assigned)  Question Answer Comment  Location [facility]   Reason for Consult? bipolar      08/02/24 1711   07/24/24 1656  IP CONSULT TO PSYCHIATRY       Ordering Provider: Patsy Lenis, MD  Provider:  (Not yet assigned)  Question Answer Comment  Location [facility]   Reason for Consult? needing formal eval for capacity; may need guardianship given no family/support for discharge      07/24/24 1656   07/16/24 1412  IP CONSULT TO PSYCHIATRY       Ordering Provider: Dino Antu, MD  Provider:  (Not yet assigned)  Question Answer Comment  Location [facility]   Reason for Consult? 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  MDD, recurrent, severe, without psychotic  features 3.  Stimulant use disorder, in controlled setting Rule out traumatic brain injury from chronic subdural hematomas and neurosurgical procedures and complications Rule out bipolar disorder but likely previous manic episodes were meth induced per chart review  Assessment  RISHIT BURKHALTER is a 58 y.o. male admitted: Medicallyfor 07/15/2024 11:15 PM for recurrent subdural hematoma. He carries the psychiatric diagnoses of bipolar disorder and alcohol use disorder and has a past medical history of subdural hematoma status postcraniotomy.   Current differential includes drug-induced parkinsonism as most likely issue that is slowly responding to medications.  DIP is most consistent with the characteristics of tremor including the frequency of the tremor being slower and consistent, being present at rest, and resolving with patient focusing on stopping it or being distracted.  Similarly to this there is some concern patient might have rapid syndrome which should be treated similarly to DIP.  Due to concern for drug-induced parkinsonism continuing benztropine  1 mg twice daily.  Furthermore there is concern for akathisia as patient has significant subjective anxiety and restlessness and some reports that akathisia can present in the perioral area similar to patient.  Patient has had questionable response to benzos and propranolol .  Plan to have primary team titrate propranolol  as tolerated for blood pressure so that this medication is present regardless and given it would help patient with his anxiety.  Furthermore plan to increase benzodiazepines as patient has  only been on a very small dose in order to rule out akathisia and help symptomatically while patient is given more time off antipsychotics.  Also some increasing concern for tardive tremor which is not characteristic of the tremor given these appear is more of a choreatic movements and less of a rhythmic movement but would warrant trialing with  Ingrezza  if not responding to current regimen and being off antipsychotics in a couple more days. Movement also disappears in sleep.   Lower suspicion is functional tremor.  Other causes are must rule out so we are planning to treat regardless of this.  Patient is noted to have this tremor even when he is not being watched.  This is diagnosis of exclusion.   Regarding next steps we will continue to monitor patient on higher dose of clonazepam  and continue the propranolol  and benztropine .  Furthermore we will talk with the nurse to see if patient is having the tremors in his sleep which would be more suggestive of tardive dyskinesia.  08/30/24 Oversedated from clonazepam . He has had questionable improvement over last several days. Given concern for tardive dyskinesia titrating valbenazine  but if not clearly helping by next Wed would be reasonable to discontinue given high cost if not clearly benefiting patient. Regard would continue utilizing the benzodiazepines, propranolol , and mirtazapine . Klonipin 2 mg was cuasing too much sedating for pt but 1 mg TID seems okay.    Diagnoses:  Active Hospital problems: Principal Problem:   Subdural hematoma (HCC) Active Problems:   Bipolar disorder (HCC)   Essential hypertension   Alcohol withdrawal (HCC)   Unable to make decisions about medical treatment due to impaired mental capacity   Protein-calorie malnutrition, severe   Acute metabolic encephalopathy   Infection of craniotomy plate    Plan   ## Psychiatric Medication Recommendations:  Continue mirtazapine  15 mg once daily at bedtime for akathisia, insomnia, mood; monitor for mania switching but has tolerated >1 wk including benefit for jaw movement at night and sleep Continue propranolol  as can be helpful for akithisia if present, can help anxiety, and primary team can titrate for HTN Continue clonazepam  1 mg three times daily for possible akathisia Ativan  0.5 PRN could potentially be d/c  soon Continue valbenazine  40 mg for 3 days then increase to 80 mg (on 11/30) for possible tardive dyskinesia; reassess patient on Wed 12/3 to see if it is effective and discontinue if it is not producing benefit.  Consider pregabalin for TBI / anxiety in future Resolutions of symptoms can take up to 7 days so we will continue to monitor.  ## Medical Decision Making Capacity: Not specifically addressed in this encounter  ## Further Work-up:  -- TSH 1 month ago 0.775 While pt on Qtc prolonging medications, please monitor & replete K+ to 4 and Mg2+ to 2 -- most recent EKG on 11/19 had QtC of 414 -- Pertinent labwork reviewed earlier this admission includes: GFR greater than 60, LFT relatively within normal limits mildly elevated ALT, hemoglobin 11-12  ## Disposition:--defer to patient talking with primary team. We would recommend holding off discharge until we have seen how patient has trialed valbenazine  but could also be follow up with psychiatry at SNF. If pt is having substantial distress from this experience this could be a reason to defer discharge but defer to primary team discussion with pt.   ## Behavioral / Environmental: -Delirium Precautions: Delirium Interventions for Nursing and Staff: - RN to open blinds every AM. - To Bedside: Glasses, hearing  aide, and pt's own shoes. Make available to patients. when possible and encourage use. - Encourage po fluids when appropriate, keep fluids within reach. - OOB to chair with meals. - Passive ROM exercises to all extremities with AM & PM care. - RN to assess orientation to person, time and place QAM and PRN. - Recommend extended visitation hours with familiar family/friends as feasible. - Staff to minimize disturbances at night. Turn off television when pt asleep or when not in use.    ## Safety and Observation Level:  - Based on my clinical evaluation, I estimate the patient to be at low risk of self harm in the current setting. - At this time,  we recommend  routine. This decision is based on my review of the chart including patient's history and current presentation, interview of the patient, mental status examination, and consideration of suicide risk including evaluating suicidal ideation, plan, intent, suicidal or self-harm behaviors, risk factors, and protective factors. This judgment is based on our ability to directly address suicide risk, implement suicide prevention strategies, and develop a safety plan while the patient is in the clinical setting. Please contact our team if there is a concern that risk level has changed.  CSSR Risk Category:C-SSRS RISK CATEGORY: No Risk  Suicide Risk Assessment: Patient has following modifiable risk factors for suicide: untreated depression, which we are addressing by medication adjustments. Patient has following non-modifiable or demographic risk factors for suicide: Male and psychiatric hospitalization Patient has the following protective factors against suicide: n/a  Thank you for this consult request. Recommendations have been communicated to the primary team.  We will continue to follow at this time.   Justino Cornish, MD       History of Present Illness  Relevant Aspects of [facility] Course per hospitalist:  PCCM Xfer 42/39. 58 year old male, homeless, medical history significant for alcohol use disorder, bipolar and panic disorder, [facility] September [facility] with a large subdural hematoma.  Underwent craniotomy with flap replacement on 10/27.  Since then multiple OR visits including 10/30 for [facility] evacuation, follow-up CT head 11/8 showed fluid collection underneath the bone flap, back to OR on 11/10 and the bone flap was removed, cultures were obtained, intraoperative cultures grew MRSA, treating for infected bone flap, ID on board and currently on IV vancomycin . Refer to PCCM progress note from 11/11 for detailed tabulation of hospital events until  then.  Patient Report:  Reports that he is feeling ok to leave hospital now with his tremors but it is still bothering him and occurring all the time. Reports tooth pain that is limiting his ability to eat. Reports that he is sleepy.    Psych ROS on initial assesment:  Depression: Reports he has been having depressed mood, anhedonia, worthlessness, at lowest in his life, but denies suicidal thoughts but does have morbid rumination Anxiety: Reports that he is an anxious person but that the anxiety has been much worse since being the hospital.  Says that he feels restless and is having issues sleeping because of it.  Reports that he is anxious about his situation where he will go from here. Mania (lifetime and current): Endorses having manic episodes but when asking about these episodes he does not describe any increased goal-directed activity, denies any decreased need for sleep, but does endorse feeling better, talking more, and impulsivity.  Denies having any dysfunction during these times.  Denies having any distractibility, grandiosity, flight of ideas, risky behaviors. Psychosis: (  lifetime and current): Denies AVH or paranoia. Sleep: Reports that he cannot sleep with the shaking and in general   Psychiatric and Social History  Psychiatric History:  Information collected from patient and chart review  Prev Dx/Sx: Bipolar disorder, methamphetamine use, substance-induced mood disorder, alcohol use disorder Current Psych Provider: None Home Meds (current): Restarted on Latuda  40 and titrated to 80 this hospitalization, was not taking anything prior to admission in the short period that  He was away from hospital.  Also restarted on Klonopin  Previous Med Trials: Lithium , Klonopin , Latuda , Haldol  as needed for agitation Therapy: None  Prior Psych Hospitalization: Previous hospitalizations although the psychiatric hospitalizations are not documented but the emergency department visits prior to  have been documented and include episodes documented as mania but are occurring in the presence of positive amphetamine Prior Self Harm: Denies Prior Violence: Denies  Family Psych History: Denies Family Hx suicide: Denies  Social History:  Reports that he has been experiencing homelessness.  Does not report any recent incarceration which appears to conflict with previous chart review.  Does report he has a upcoming court date on December 7.  Denies access to weapons.  Reports that his hobby is making rock music.  Reports that he has a friend who is his international aid/development worker. Access to weapons/lethal means: Denies  Substance History Denies using any substances recently.  Has been in controlled setting.  Does have history of using stimulants but he denies when asked during interview.  Exam Findings  Physical Exam:  Vital Signs:  Temp:  [97.3 F (36.3 C)-97.9 F (36.6 C)] 97.3 F (36.3 C) (11/28 0840) Pulse Rate:  [56-63] 63 (11/28 0840) Resp:  [17-18] 17 (11/28 0840) BP: (107-126)/(68-93) 107/93 (11/28 0840) SpO2:  [98 %-100 %] 98 % (11/28 0840) Weight:  [66 kg] 66 kg (11/28 0500) Blood pressure (!) 107/93, pulse 63, temperature (!) 97.3 F (36.3 C), temperature source Oral, resp. rate 17, height 5' 10 (1.778 m), weight 66 kg, SpO2 98%. Body mass index is 20.88 kg/m.  Physical Exam Constitutional:      Comments: Chronic ill appearing  Pulmonary:     Effort: Pulmonary effort is normal.  Neurological:     Comments: No pain or stiffness with passive ROM of bilateral upper extremities.   Tremors of mouth including horizontal movements that go aware with sleep or distraction. Involuntary movements of tongue with protrusion. Occasional tremors of hands toward beginning but now has cessated.       Mental Status Exam: General Appearance: Patient is present in his room sitting in bed unable to move himself very well, blinds are closed, laying in clean gown, neurosurgical scar on head,   Orientation: Oriented to person place but not time  Memory: Did not formally assess  Concentration: Did not formally assess but grossly intact  Recall: Grossly intact but did not formally assess  Attention grossly intact but not formally assessed  Eye Contact: Good  Speech: Fair, dysarthric  Language: Fair  Volume: Normal  Mood: Lowest point my life  Affect: Depressed, flat, anxious  Thought Process: Coherent linear  Thought Content: Logical.  Morbid ruminations.  Worried about current situation and future.  Suicidal Thoughts:  No  Homicidal Thoughts:  No  Judgement:  Fair  Insight:  Fair  Psychomotor Activity:  Increased, dyskinetic movements of mouth and hands.  With mouth movement is sliding jaw back and forth.  There is some voluntary inhibition but is overall involuntary when he is not overriding. Movement goes  away in sleep.   Akathisia:  Yes  Fund of Knowledge:  Fair      Assets:  Desire for Improvement  Cognition: Did not formally assess  ADL's: Impaired by patient's movement limitations but is improving  AIMS (if indicated):  0     Other History   These have been pulled in through the EMR, reviewed, and updated if appropriate.  Family History:  The patient's family history is not on file.  Medical History: Past Medical History:  Diagnosis Date   Anxiety    Bipolar 1 disorder (HCC)    Depression    Hepatitis    Hypertension    MRSA (methicillin resistant Staphylococcus aureus)    5/25 arm abscess and 11/25    Surgical History: Past Surgical History:  Procedure Laterality Date   BURR HOLE N/A 08/01/2024   Procedure: CREATION, CRANIAL BURR HOLE WITH EVACUATION OF HEMATOMA;  Surgeon: Rosslyn Dino HERO, MD;  Location: [facility] OR;  Service: Neurosurgery;  Laterality: N/A;   BURR HOLE N/A 08/12/2024   Procedure: left bone flap removal;  Surgeon: Rosslyn Dino HERO, MD;  Location: [facility] OR;  Service: Neurosurgery;  Laterality: N/A;   CRANIOPLASTY, WITH BONE FLAP  REPLACEMENT Left 07/29/2024   Procedure: CRANIOPLASTY, WITH BONE FLAP REPLACEMENT;  Surgeon: Rosslyn Dino HERO, MD;  Location: [facility] OR;  Service: Neurosurgery;  Laterality: Left;  LEFT CRANIOPLASTY WITH ARTIFICAL BONE FLAP REPLACEMENT   CRANIOTOMY Left 06/30/2024   Procedure: CRANIOTOMY HEMATOMA EVACUATION SUBDURAL;  Surgeon: Melonie Grass, MD;  Location: [facility] ORS;  Service: Neurosurgery;  Laterality: Left;     Medications:   Current Facility-Administered Medications:    acetaminophen  (TYLENOL ) tablet 650 mg, 650 mg, Oral, Q4H PRN, 650 mg at 08/30/24 0005 **OR** [PENDING] acetaminophen  (TYLENOL ) tablet 650 mg, 650 mg, Per Tube, Q4H PRN **OR** acetaminophen  (TYLENOL ) suppository 650 mg, 650 mg, Rectal, Q4H PRN **OR** [PENDING] acetaminophen  (TYLENOL ) suppository 650 mg, 650 mg, Rectal, Q4H PRN,    Chlorhexidine  Gluconate Cloth 2 % PADS 6 each, 6 each, Topical, Daily, Franky Redia SAILOR, MD, 6 each at 08/30/24 9180   clonazePAM  (KLONOPIN ) tablet 2 mg, 2 mg, Oral, TID, Tyniya Kuyper, MD, 2 mg at 08/30/24 0810   docusate (COLACE) 50 MG/5ML liquid 100 mg, 100 mg, Oral, Daily, Hongalgi, Anand D, MD, 100 mg at 08/28/24 9087   docusate sodium  (COLACE) capsule 100 mg, 100 mg, Oral, BID PRN, Autry, Lauren E, PA-C, 100 mg at 08/25/24 0847   feeding supplement (ENSURE PLUS HIGH PROTEIN) liquid 237 mL, 237 mL, Oral, BID BM, Danford, Christopher P, MD, 237 mL at 08/30/24 0826   folic acid  (FOLVITE ) tablet 1 mg, 1 mg, Oral, Daily, Ogbata, Sylvester I, MD, 1 mg at 08/30/24 9189   gabapentin  (NEURONTIN ) capsule 200 mg, 200 mg, Oral, TID, Danford, Lonni SQUIBB, MD, 200 mg at 08/30/24 0809   guaiFENesin -dextromethorphan (ROBITUSSIN DM) 100-10 MG/5ML syrup 5 mL, 5 mL, Oral, Q4H PRN, Pudota, Ellouise SQUIBB, MD   heparin  injection 5,000 Units, 5,000 Units, Subcutaneous, Q8H, Desai, Rahul P, PA-C, 5,000 Units at 08/30/24 0608   labetalol  (NORMODYNE ) injection 10-40 mg, 10-40 mg, Intravenous, Q10 min PRN, Janjua,  Rashid M, MD   LORazepam  (ATIVAN ) injection 0.5 mg, 0.5 mg, Intravenous, Q6H PRN, Krishnan, Gokul, MD, 0.5 mg at 08/29/24 0320   magic mouthwash w/lidocaine , 5 mL, Oral, TID PRN, Regalado, Belkys A, MD, 5 mL at 08/30/24 0827   melatonin tablet 10 mg, 10 mg, Oral, QHS PRN, Shalhoub, George J, MD, 10 mg at 08/29/24  2147   mirtazapine  (REMERON ) tablet 15 mg, 15 mg, Oral, QHS, Ortha Metts, MD, 15 mg at 08/29/24 2147   multivitamin with minerals tablet 1 tablet, 1 tablet, Oral, Daily, Rosario Eland I, MD, 1 tablet at 08/30/24 0809   nicotine  (NICODERM CQ  - dosed in mg/24 hours) patch 14 mg, 14 mg, Transdermal, Daily, Fredia Dorothe HERO, MD, 14 mg at 08/30/24 9187   nystatin  (MYCOSTATIN ) 100000 UNIT/ML suspension 500,000 Units, 5 mL, Oral, QID, Regalado, Belkys A, MD, 500,000 Units at 08/30/24 9173   ondansetron  (ZOFRAN ) tablet 4 mg, 4 mg, Oral, Q4H PRN **OR** ondansetron  (ZOFRAN ) injection 4 mg, 4 mg, Intravenous, Q4H PRN, Janjua, Rashid M, MD, 4 mg at 08/01/24 1506   Oral care mouth rinse, 15 mL, Mouth Rinse, PRN, Mannam, Praveen, MD   pantoprazole  (PROTONIX ) EC tablet 40 mg, 40 mg, Oral, QHS, Hongalgi, Anand D, MD, 40 mg at 08/29/24 2147   polyethylene glycol (MIRALAX  / GLYCOLAX ) packet 17 g, 17 g, Oral, Daily, Desai, Rahul P, PA-C, 17 g at 08/28/24 0902   promethazine  (PHENERGAN ) tablet 12.5-25 mg, 12.5-25 mg, Oral, Q4H PRN, Janjua, Rashid M, MD   propranolol  (INDERAL ) tablet 20 mg, 20 mg, Oral, BID, Krishnan, Gokul, MD, 20 mg at 08/30/24 0810   senna (SENOKOT) tablet 17.2 mg, 2 tablet, Oral, Daily, Desai, Rahul P, PA-C, 17.2 mg at 08/28/24 0902   sodium chloride  flush (NS) 0.9 % injection 10-40 mL, 10-40 mL, Intracatheter, Q12H, Franky Redia SAILOR, MD, 10 mL at 08/30/24 0815   sodium chloride  flush (NS) 0.9 % injection 10-40 mL, 10-40 mL, Intracatheter, PRN, Franky Redia SAILOR, MD, 30 mL at 07/29/24 1135   thiamine  (VITAMIN B1) tablet 100 mg, 100 mg, Oral, Daily, Hongalgi, Anand D, MD, 100  mg at 08/30/24 9189   valbenazine  (INGREZZA ) capsule 40 mg, 40 mg, Oral, Daily, Adore Kithcart, MD, 40 mg at 08/30/24 0827   [START ON 09/01/2024] valbenazine  (INGREZZA ) capsule 80 mg, 80 mg, Oral, Daily, Cornelius Dines, MD   vancomycin  (VANCOCIN ) IVPB 1000 mg/200 mL premix, 1,000 mg, Intravenous, Q12H, Alveria Modest, RPH, Last Rate: 200 mL/hr at 08/30/24 0959, 1,000 mg at 08/30/24 9040  Allergies: No Known Allergies  Dines Cornelius, MD

## 2024-08-30 NOTE — Progress Notes (Signed)
 Physical Therapy Treatment Patient Details Name: Ernest Romero MRN: [id number] DOB: 09/08/1966 Today's Date: 08/30/2024   History of Present Illness Pt is a 58 y.o. male presenting 10/14 from [facility] where he was admitted 9/28 for unresponsive episode in the jail. 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 On CIWA protocol. Transferred to [facility] for further management of craniotomy and flap. 10/27 s/p cranioplasty with bone flap replacement. 10/28 Change in status thought to be related to Klonopin  administration 10/28, repeat CTH shows significant increase in size of mixed attenuation subdural fluid collection underlying the L tempoparietal craniotomy site, now measuring approximately 15 mm in thickness, with associated mild mass effect and slight rightward midline shift. OR on 10/30 for SDH evacuation. [facility] 11/7 with fluid collected under bone flap. 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 PMH alcohol use disorder, psychiatric disorder, HTN    PT Comments  Pt received in bed with nurse tech safety sitter present, agreeable to working with PT. Required extra time for all mobility tasks today as well as extra effort for bed mobility in particular, but able to progress gait distance from last session. He reported, I just don't feel like walking much today but did a good job staying inside RW and managing device overall. Left in bed with all needs met, staff sitter present and assisting with lunch.     If plan is discharge home, recommend the following: A lot of help with bathing/dressing/bathroom;Assistance with cooking/housework;Direct supervision/assist for medications management;Direct supervision/assist for financial management;Assist for transportation;Help with stairs or ramp for entrance;Supervision due to cognitive status;A little help with walking and/or transfers   Can travel by private vehicle      Yes  Equipment Recommendations  Wheelchair (measurements PT);Wheelchair cushion (measurements PT);Rolling walker (2 wheels)    Recommendations for Other Services       Precautions / Restrictions Precautions Precautions: Fall Recall of Precautions/Restrictions: Impaired Precaution/Restrictions Comments: SBP <160, helmet (pt to wear when OOB per RN) Restrictions Weight Bearing Restrictions Per Provider Order: No     Mobility  Bed Mobility Overal bed mobility: Needs Assistance Bed Mobility: Supine to Sit, Sit to Supine     Supine to sit: Mod assist, HOB elevated Sit to supine: Mod assist, HOB elevated   General bed mobility comments: had a harder time with sequencing for bed mobility today, needed extra time and assist from PT as well as extra step by step cues for sequencing    Transfers Overall transfer level: Needs assistance Equipment used: Rolling walker (2 wheels) Transfers: Sit to/from Stand Sit to Stand: Contact guard assist           General transfer comment: min guard for steadyin, no true physical assist given    Ambulation/Gait Ambulation/Gait assistance: Contact guard assist Gait Distance (Feet): 75 Feet Assistive device: Rolling walker (2 wheels) Gait Pattern/deviations: Step-through pattern, Decreased stride length, Narrow base of support, Shuffle Gait velocity: decr     General Gait Details: slow gait speed with narrow BOS, less tremors noted with walking but he was more easily fatigued today   Stairs             Wheelchair Mobility     Tilt Bed    Modified Rankin (Stroke Patients Only)       Balance Overall balance assessment: Needs assistance Sitting-balance support: Feet supported, Bilateral upper extremity supported Sitting balance-Leahy Scale: Fair Sitting balance - Comments:  posterior bias requiring cueing and able to self correct, one time posterior LOB but generally able to maintain upright midline for drinking and  helmet donning/doffing Postural control: Posterior lean Standing balance support: Bilateral upper extremity supported, During functional activity, No upper extremity supported Standing balance-Leahy Scale: Poor Standing balance comment: posterior bias, reliant on BUE support                            Communication Communication Communication: Impaired Factors Affecting Communication: Difficulty expressing self  Cognition Arousal: Alert Behavior During Therapy: Flat affect   PT - Cognitive impairments: Memory, Initiation, Problem solving, Safety/Judgement, Sequencing                   Rancho Levels of Cognitive Functioning Rancho Los Amigos Scales of Cognitive Functioning: Automatic, Appropriate: Minimal Assistance for Daily Living Skills Rancho Los Amigos Scales of Cognitive Functioning: Automatic, Appropriate: Minimal Assistance for Daily Living Skills [VII] PT - Cognition Comments: able to hold basic conversation and express needs to PT and nursing staff, but needed heavy cues for sequencing and path finding Following commands: Impaired Following commands impaired: Follows one step commands with increased time, Follows multi-step commands inconsistently    Cueing Cueing Techniques: Verbal cues  Exercises      General Comments        Pertinent Vitals/Pain Pain Assessment Pain Assessment: Faces Faces Pain Scale: Hurts little more Pain Location: generalized, jaw and shoulders Pain Descriptors / Indicators: Discomfort Pain Intervention(s): Limited activity within patient's tolerance, Monitored during session, RN gave pain meds during session    Home Living                          Prior Function            PT Goals (current goals can now be found in the care plan section) Acute Rehab PT Goals Patient Stated Goal: none stated PT Goal Formulation: With patient Time For Goal Achievement: 09/06/24 Potential to Achieve Goals: Fair Progress  towards PT goals: Not progressing toward goals - comment    Frequency    Min 2X/week      PT Plan      Co-evaluation              AM-PAC PT 6 Clicks Mobility   Outcome Measure  Help needed turning from your back to your side while in a flat bed without using bedrails?: A Little Help needed moving from lying on your back to sitting on the side of a flat bed without using bedrails?: A Lot Help needed moving to and from a bed to a chair (including a wheelchair)?: A Little Help needed standing up from a chair using your arms (e.g., wheelchair or bedside chair)?: A Little Help needed to walk in hospital room?: A Little Help needed climbing 3-5 steps with a railing? : A Lot 6 Click Score: 16    End of Session   Activity Tolerance: Patient limited by fatigue Patient left: in bed;with call bell/phone within reach;with nursing/sitter in room Nurse Communication: Mobility status PT Visit Diagnosis: Other symptoms and signs involving the nervous system (R29.898);Muscle weakness (generalized) (M62.81)     Time: 8683-8656 PT Time Calculation (min) (ACUTE ONLY): 27 min  Charges:    $Gait Training: 8-22 mins $Therapeutic Activity: 8-22 mins PT General Charges $$ ACUTE PT VISIT: 1 Visit  Josette Rough, PT, DPT 08/30/24 1:52 PM

## 2024-08-31 ENCOUNTER — Inpatient Hospital Stay (HOSPITAL_COMMUNITY): Payer: MEDICAID

## 2024-08-31 LAB — GLUCOSE, CAPILLARY
Glucose-Capillary: 103 mg/dL — ABNORMAL HIGH (ref 70–99)
Glucose-Capillary: 137 mg/dL — ABNORMAL HIGH (ref 70–99)
Glucose-Capillary: 87 mg/dL (ref 70–99)
Glucose-Capillary: 96 mg/dL (ref 70–99)

## 2024-08-31 MED ORDER — VANCOMYCIN HCL 750 MG/150ML IV SOLN
750.0000 mg | Freq: Two times a day (BID) | INTRAVENOUS | Status: DC
  Administered 2024-08-31 – 2024-09-12 (×26): 750 mg via INTRAVENOUS
  Filled 2024-08-31 (×28): qty 150

## 2024-08-31 NOTE — Plan of Care (Signed)
  Problem: Education: Goal: Knowledge of General Education information will improve Description: Including pain rating scale, medication(s)/side effects and non-pharmacologic comfort measures Outcome: Progressing   Problem: Health Behavior/Discharge Planning: Goal: Ability to manage health-related needs will improve Outcome: Progressing   Problem: Clinical Measurements: Goal: Ability to maintain clinical measurements within normal limits will improve Outcome: Progressing Goal: Will remain free from infection Outcome: Progressing Goal: Diagnostic test results will improve Outcome: Progressing Goal: Respiratory complications will improve Outcome: Progressing Goal: Cardiovascular complication will be avoided Outcome: Progressing   Problem: Activity: Goal: Risk for activity intolerance will decrease Outcome: Progressing   Problem: Nutrition: Goal: Adequate nutrition will be maintained Outcome: Progressing   Problem: Coping: Goal: Level of anxiety will decrease Outcome: Progressing   Problem: Elimination: Goal: Will not experience complications related to bowel motility Outcome: Progressing Goal: Will not experience complications related to urinary retention Outcome: Progressing   Problem: Pain Managment: Goal: General experience of comfort will improve and/or be controlled Outcome: Progressing   Problem: Safety: Goal: Ability to remain free from injury will improve Outcome: Progressing   Problem: Skin Integrity: Goal: Risk for impaired skin integrity will decrease Outcome: Progressing   Problem: Education: Goal: Knowledge of the prescribed therapeutic regimen will improve Outcome: Progressing   Problem: Clinical Measurements: Goal: Usual level of consciousness will be regained or maintained. Outcome: Progressing Goal: Neurologic status will improve Outcome: Progressing Goal: Ability to maintain intracranial pressure will improve Outcome: Progressing    Problem: Skin Integrity: Goal: Demonstration of wound healing without infection will improve Outcome: Progressing

## 2024-08-31 NOTE — Progress Notes (Signed)
 Speech Language Pathology Treatment: Dysphagia  Patient Details Name: Ernest Romero MRN: [id number] DOB: Jul 20, 1966 Today's Date: 08/31/2024 Time: 9071-9057 SLP Time Calculation (min) (ACUTE ONLY): 14 min  Assessment / Plan / Recommendation Clinical Impression  Coughing after PO's (thin liquids with meds). Rec: continue NPO except meds, proceed with MBS.  SLP reordered to assess swallow secondary to RN observing patient to have coughing with PO's of thin liquids while taking medications. He was made NPO except for medications. SLP noted a wet voice with wet secretions in oral cavity. Oral suction used and patient cued to cough, resulting in expectoration of more secretions. Voice was then clear. SLP assessed patient's swallow at bedside as he took his oral medications whole with thin liquids (water) and Ensure shake. Several instances of immediate and delayed, congested sounding coughing observed which were brief in duration. SLP recommending continue NPO except medications and will proceed with MBS to r/o aspiration.   HPI HPI: Patient is a 58 y.o. male who was initially admitted to [facility] on 06/30/24 after an unresponsive episode in jail. He was found to have a large SDH. He underwent a left frontoparietal craniotomy with evacuation of SDH and drain placement 9/28 (drain removed 9/29). SLP was following patient at [facility] for cognitive-linguistic goals. He was on a regular solids, thin liquids diet prior to transfer to [facility] on 10/14 for further management of craniotomy and flap. On 10/27, he underwent cranioplasty with bone flap replacement. On 10/28 he had a change in cognitive status thought to be due to Klonopin  but repeat CTH showed significant increase in size of mixed attenuation subdural fluid collection underlying the left tempoparietal craniotomy site with associated mild mass effect and slight rightward midline shift. He underwent left burr hole for evacuation on 10/30. He underwent left bone flap  removal on 11/10 after developing SDH s/p burr hole evacuation followed by swelling and suspected infection. 10/30 CXR without acute cardiopulmonary abnormality. SLP evaluated patient for swallow function at bedside on 10/28 recommending NPO due to change in mentation but diet initiated on 10/31 following PO trials. RN requested SLP reassess patient's swallowing on 11/18 due to concerns of coughing when drinking liquids. Patient reassessed at bedside and continuation of Dys 3, thin liquids diet recommended with carful monitoring. SLP swallow evaluation reordered on 11/28 due to RN observing patient to cough when taking medications.      SLP Plan  Continue with current plan of care;MBS          Recommendations  Diet recommendations: NPO Medication Administration: Whole meds with liquid (meds whole) Compensations: Slow rate;Small sips/bites Postural Changes and/or Swallow Maneuvers: Seated upright 90 degrees                  Oral care BID   Frequent or constant Supervision/Assistance Dysphagia, unspecified (R13.10)     Continue with current plan of care;MBS     Norleen IVAR Blase, MA, CCC-SLP Speech Therapy

## 2024-08-31 NOTE — Procedures (Signed)
 Modified Barium Swallow Study  Patient Details  Name: Ernest Romero MRN: [id number] Date of Birth: 08/02/1966  Today's Date: 08/31/2024  Modified Barium Swallow completed.  Full report located under Chart Review in the Imaging Section.  History of Present Illness Patient is a 58 y.o. male who was initially admitted to [facility] on 06/30/24 after an unresponsive episode in jail. He was found to have a large SDH. He underwent a left frontoparietal craniotomy with evacuation of SDH and drain placement 9/28 (drain removed 9/29). SLP was following patient at [facility] for cognitive-linguistic goals. He was on a regular solids, thin liquids diet prior to transfer to [facility] on 10/14 for further management of craniotomy and flap. On 10/27, he underwent cranioplasty with bone flap replacement. On 10/28 he had a change in cognitive status thought to be due to Klonopin  but repeat CTH showed significant increase in size of mixed attenuation subdural fluid collection underlying the left tempoparietal craniotomy site with associated mild mass effect and slight rightward midline shift. He underwent left burr hole for evacuation on 10/30. He underwent left bone flap removal on 11/10 after developing SDH s/p burr hole evacuation followed by swelling and suspected infection. 10/30 CXR without acute cardiopulmonary abnormality. SLP evaluated patient for swallow function at bedside on 10/28 recommending NPO due to change in mentation but diet initiated on 10/31 following PO trials. RN requested SLP reassess patient's swallowing on 11/18 due to concerns of coughing when drinking liquids. Patient reassessed at bedside and continuation of Dys 3, thin liquids diet recommended with carful monitoring. SLP swallow evaluation reordered on 11/28 due to RN observing patient to cough when taking medications.   Clinical Impression Patient presents with a severe oropharyngeal dysphagia as per this MBS. He exhibited silent aspiration (PAS 8) of  gross amounts with thin liquids, nectar thick liquids and honey thick liquids and instances of frank aspiration of puree solid residuals observed as well. Anterior hyoid excursion appeared [facility], however patient exhibited minimal laryngeal elevation, no epiglottic inversion, incomplete laryngeal vestibule closure. Aspiration occured during the swallow but also occured after the swallow from residuals spilling over arytenoid cartilage and into open airway. Aspiration was silent however patient did exhibit instances of sensation slightly after aspiration occured. Patient unable to effectively follow commands for strategies but even with SLP providing physical assist, he was unable to achieve adequate chin tuck posture. Cued secondary swallows did aid to clear mild amount of pharyngeal residuals but not effective to clear pyriform sinus or residuals on arytenoids. Cued throat clear was not effective to clear penetrated or aspirated barium. SLP recommending continue NPO status, medications crushed in puree. MD in agreement for CT chest to determine if patient's lungs are clear. Further recommendations pending CT chest.  DIGEST Swallow Severity Rating*  Safety: 4  Efficiency: 2  Overall Pharyngeal Swallow Severity: 3 1: mild; 2: moderate; 3: severe; 4: profound  *The Dynamic Imaging Grade of Swallowing Toxicity is standardized for the head and neck cancer population, however, demonstrates promising clinical applications across populations to standardize the clinical rating of pharyngeal swallow safety and severity.  Factors that may increase risk of adverse event in presence of aspiration Noe & Lianne 2021): Aspiration of thick, dense, and/or acidic materials;Frequent aspiration of large volumes;Reduced cognitive function  Swallow Evaluation Recommendations Recommendations: NPO;NPO except meds Medication Administration: Crushed with puree Supervision: Full supervision/cueing for swallowing  strategies Swallowing strategies  : Slow rate;Small bites/sips Oral care recommendations: Oral care BID (2x/day);Staff/trained caregiver to provide oral care  Ernest IVAR Blase, MA, CCC-SLP Speech Therapy  08/31/2024,2:35 PM

## 2024-08-31 NOTE — Progress Notes (Signed)
 Upon entering room to give meds per pt request, pt noted to have a cough. Pt ambulated to restroom and returned back to bed. Nighttime meds given. After drinking liquid, pt notated to cough after each sip and voice became watery. Pt stated to have something stuck in his throat that he cannot clear. Pt throat suctioned. Moderate white secretions removed. Official swallow study done. After drinking water, pt once again noted to cough after a sip of thin liquid, voice became watery and could not clear secretions once again. Pt throat suctioned again with same results. Dr. Alfornia notified at 2046. Pt placed NPO, speech consult, Q6 FSBS, and fluids ordered (see chart). Pt made aware of NPO status. Pt currently resting comfortably with eyes closed in bed.

## 2024-08-31 NOTE — Progress Notes (Signed)
 Pharmacy Antibiotic Note  Ernest Romero is a 58 y.o. 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 Dosing for CNS penetration with target troughs 15-20 mcg/ml. Per ID, plan 6 weeks of Vancomycin  (thru 09/23/24).  Vancomycin  levels drawn today and calculated AUC and trough is supratherapeutic  Plan: Change Vancomycin  750 mg IV q12h for new calculated AUC of 450 and trough of 15    Height: 5' 10 (177.8 cm) Weight: 66 kg (145 lb 8.1 oz) IBW/kg (Calculated) : 73  Temp (24hrs), Avg:97.7 F (36.5 C), Min:97.3 F (36.3 C), Max:98 F (36.7 C)  Recent Labs  Lab 08/25/24 1235 08/26/24 0547 08/28/24 0348 08/30/24 0602 08/30/24 1201 08/30/24 2243  WBC  --   --  6.1  --   --   --   CREATININE 0.93 0.87 1.08 1.07  --   --   VANCOTROUGH  --   --  24*  --   --  20  VANCOPEAK  --   --   --   --  29*  --     Estimated Creatinine Clearance: 68.5 mL/min (by C-G formula based on SCr of 1.07 mg/dL).    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

## 2024-08-31 NOTE — Plan of Care (Signed)
   Problem: Education: Goal: Knowledge of General Education information will improve Description Including pain rating scale, medication(s)/side effects and non-pharmacologic comfort measures Outcome: Progressing

## 2024-08-31 NOTE — Progress Notes (Signed)
 PROGRESS NOTE    Ernest Romero  FMW:[id number] DOB: 1966-06-17 DOA: 07/15/2024 PCP: [name]   Brief Narrative: 58 year old homeless, medical history significant for alcohol use disorder, bipolar and panic disorder, initially presented to [facility] from jail in September 2025 with a large subdural hematoma.  Underwent craniotomy with flap replacement on 10/27.  Since then multiple OR visits including 10/30 for [facility] evacuation, follow-up CT head 11/8 showed fluid collection underneath the bone flap, back to the OR 11/10 and then bone flap was removed, cultures were obtained intraoperative growing MRSA, treating for infected bone flap, ID following and currently on IV vancomycin .  10/27 crani w/ flap replacement 10/29: off clevi, liberate SBP goal, Klonopin , lithium , Haldol  as needed discontinued.  Latuda  dose cut by half 10/30: repeat CT head with increased collection - going to OR this afternoon for evacuation 10/31: OR  for [facility] evacuation.  Developed arm twitching.  Klonopin  restarted at half dose 11/1: More awake.  No complaint 11/2 transferred out of ICU. 11/4 cortrak removed 11/8 CT head showed fluid collection under bone flap, decision made to remove bone flap 11/10 to OR for L bone flap removal 11/11 repeat CT head stable  Assessment & Plan:   Principal Problem:   Subdural hematoma (HCC) Active Problems:   Bipolar disorder (HCC)   Unable to make decisions about medical treatment due to impaired mental capacity   Alcohol withdrawal (HCC)   Essential hypertension   Protein-calorie malnutrition, severe   Acute metabolic encephalopathy   Infection of craniotomy plate  1-Subdural hematoma Status post initial craniotomy 9/28 (Dr. 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 - Followed by neurosurgery.  Likely -Evaluated by infectious disease who recommends 6  weeks of IV vancomycin  and day 12/20-second/2025.  After completion of IV antibiotics he will need 8 weeks of doxycycline . - Will need PICC line placed closer to discharge date. - Completed 1 week of Keppra  - Sutures should come out December 1 Therapy recommended SNF, TOC following   2-Acute metabolic encephalopathy - At baseline, patient reportedly without cognitive impairment, was homeless and independent caring for self. - At some point during this hospitalization patient was deemed not to have capacity, due to encephalopathy and neurological deficits. - Per recent physicians notes patient appears to be able to make decision for himself moving forward.  3-Bipolar disorder/akathisia/drug-induced parkinsonism: - Patient with long standing history of bipolar disorder. - Patient was on Haldol  and Latuda , developed facial tremors 11/20 concern for tardive dyskinesia. - EEG negative for seizures. - Latuda  and Haldol  discontinued. Patient was started on propranolol  and Ativan  as needed. Psych following and adjusting medication. On Ingrezza . Klonopin   Psych started patient on Valbezine, if not helping by 09/04/2024 psych will consider to discontinue it.   4-Dysphagia Overnight after medications given with water patient felt something got stuck, he require suctioning.  -NPO until speech evaluation.  -IV fluids.   history of alcohol, tobacco use disorder: - Continue thiamine , folic acid  and multivitamin Treated for alcohol withdrawal delirium with phenobarbital  taper at [facility]. Resolved  Essential hypertension: -Blood pressure decrease with propranolol .  Amlodipine  and losartan  has been discontinued. Propranolol  dose decreased to avoid hypotension  Constipation: Continue laxatives.   Severe malnutrition: Continue ensure.   Iron deficiency anemia; started  iron supplement.   Oral Thrush; started nystatin    Nutrition Problem: Severe Malnutrition Etiology: acute  illness  Signs/Symptoms: energy intake < or equal to 50% for > or equal to 5 days, severe muscle depletion, moderate fat depletion    Interventions: Tube feeding  Estimated body mass index is 20.88 kg/m as calculated from the following:   Height as of this encounter: 5' 10 (1.778 m).   Weight as of this encounter: 66 kg.   DVT prophylaxis: Heparin   Code Status: Full code Family Communication: care discussed with patient.  Disposition Plan:  Status is: Inpatient Remains inpatient appropriate because: awaiting placement.     Consultants:  Psych ID CCM Neurosurgery     Antimicrobials:  Vancomycin    Subjective: Report having more jaw tremors this morning, he has not received his medications yet,, he has been NPO.  Discussed with nurse, ok to give meds with sip of water.   Objective: Vitals:   08/30/24 1300 08/30/24 1959 08/31/24 0018 08/31/24 0358  BP: 110/88 112/87 92/66 114/77  Pulse: 77 65 67 (!) 54  Resp: 18 16 16 16   Temp: 98 F (36.7 C) (!) 97.5 F (36.4 C) 97.9 F (36.6 C) (!) 97.4 F (36.3 C)  TempSrc: Oral Oral Oral Oral  SpO2: 98% 92% 95% 97%  Weight:      Height:        Intake/Output Summary (Last 24 hours) at 08/31/2024 0742 Last data filed at 08/30/2024 1817 Gross per 24 hour  Intake 640 ml  Output --  Net 640 ml   Filed Weights   08/28/24 0500 08/29/24 0500 08/30/24 0500  Weight: 66.1 kg 66.2 kg 66 kg    Examination:  General exam: NAD Respiratory system: CTA Cardiovascular system:S 1, S 2 RRR Gastrointestinal system: BS present, soft., nt Central nervous system: alert.  Extremities: no edema  Data Reviewed: I have personally reviewed following labs and imaging studies  CBC: Recent Labs  Lab 08/28/24 0348  WBC 6.1  HGB 11.9*  HCT 36.5*  MCV 90.8  PLT 276   Basic Metabolic Panel: Recent Labs  Lab 08/25/24 1235 08/26/24 0547 08/28/24 0348 08/30/24 0602  NA 140 142 142 144  K 4.1 4.3 4.3 4.3  CL 104 106 106  107  CO2 24 25 27 26   GLUCOSE 99 111* 108* 96  BUN 24* 21* 19 20  CREATININE 0.93 0.87 1.08 1.07  CALCIUM 9.3 9.5 9.5 9.6  MG  --  2.2  --   --   PHOS  --  3.9  --   --    GFR: Estimated Creatinine Clearance: 68.5 mL/min (by C-G formula based on SCr of 1.07 mg/dL). Liver Function Tests: Recent Labs  Lab 08/28/24 0348  AST 26  ALT 40  ALKPHOS 69  BILITOT 0.7  PROT 6.1*  ALBUMIN  3.1*   No results for input(s): LIPASE, AMYLASE in the last 168 hours. No results for input(s): AMMONIA in the last 168 hours. Coagulation Profile: No results for input(s): INR, PROTIME in the last 168 hours. Cardiac Enzymes: No results for input(s): CKTOTAL, CKMB, CKMBINDEX, TROPONINI in the last 168 hours. BNP (last 3 results) No results for input(s): PROBNP in the last 8760 hours. HbA1C: No results for input(s): HGBA1C in the last 72 hours. CBG: Recent Labs  Lab 08/31/24 0002 08/31/24 0537  GLUCAP 137* 96   Lipid Profile: No results for input(s): CHOL, HDL, LDLCALC, TRIG, CHOLHDL, LDLDIRECT in the last 72 hours. Thyroid Function Tests: Recent Labs    08/30/24 0602  TSH 1.790   Anemia Panel: Recent Labs    08/30/24 0602  VITAMINB12 460  FOLATE >20.0  FERRITIN 13*  TIBC 434  IRON 36*  RETICCTPCT 2.0   Sepsis Labs: No results for input(s): PROCALCITON, LATICACIDVEN in the last 168 hours.  No results found for this or any previous visit (from the past 240 hours).       Radiology Studies: No results found.      Scheduled Meds:  Chlorhexidine  Gluconate Cloth  6 each Topical Daily   clonazePAM   2 mg Oral TID   docusate  100 mg Oral Daily   feeding supplement  237 mL Oral BID BM   ferrous sulfate  325 mg Oral Q breakfast   folic acid   1 mg Oral Daily   gabapentin   200 mg Oral TID   heparin  injection (subcutaneous)  5,000 Units Subcutaneous Q8H   mirtazapine   15 mg Oral QHS   multivitamin with minerals  1 tablet Oral Daily    nicotine   14 mg Transdermal Daily   nystatin   5 mL Oral QID   pantoprazole   40 mg Oral QHS   polyethylene glycol  17 g Oral Daily   propranolol   20 mg Oral BID   senna  2 tablet Oral Daily   sodium chloride  flush  10-40 mL Intracatheter Q12H   thiamine   100 mg Oral Daily   valbenazine   40 mg Oral Daily   [START ON 09/01/2024] valbenazine   80 mg Oral Daily   Continuous Infusions:  sodium chloride  75 mL/hr at 08/30/24 2255   vancomycin        LOS: 47 days    Time spent: 35 Minutes    Ernest Ju A Lonetta Blassingame, MD [facility]   If 7PM-7AM, please contact night-coverage [email or web address]  08/31/2024, 7:42 AM

## 2024-09-01 LAB — CBC
HCT: 38 % — ABNORMAL LOW (ref 39.0–52.0)
Hemoglobin: 12.4 g/dL — ABNORMAL LOW (ref 13.0–17.0)
MCH: 29.2 pg (ref 26.0–34.0)
MCHC: 32.6 g/dL (ref 30.0–36.0)
MCV: 89.6 fL (ref 80.0–100.0)
Platelets: 262 K/uL (ref 150–400)
RBC: 4.24 MIL/uL (ref 4.22–5.81)
RDW: 12.7 % (ref 11.5–15.5)
WBC: 4.3 K/uL (ref 4.0–10.5)
nRBC: 0 % (ref 0.0–0.2)

## 2024-09-01 LAB — GLUCOSE, CAPILLARY
Glucose-Capillary: 101 mg/dL — ABNORMAL HIGH (ref 70–99)
Glucose-Capillary: 122 mg/dL — ABNORMAL HIGH (ref 70–99)
Glucose-Capillary: 85 mg/dL (ref 70–99)
Glucose-Capillary: 97 mg/dL (ref 70–99)

## 2024-09-01 NOTE — Plan of Care (Signed)
   Problem: Activity: Goal: Risk for activity intolerance will decrease Outcome: Progressing   Problem: Nutrition: Goal: Adequate nutrition will be maintained Outcome: Progressing   Problem: Safety: Goal: Ability to remain free from injury will improve Outcome: Progressing   Problem: Skin Integrity: Goal: Risk for impaired skin integrity will decrease Outcome: Progressing

## 2024-09-01 NOTE — Progress Notes (Signed)
 PROGRESS NOTE    Ernest Romero  FMW:[id number] DOB: 1966-08-16 DOA: 07/15/2024 PCP: [name]   Brief Narrative: 58 year old homeless, medical history significant for alcohol use disorder, bipolar and panic disorder, initially presented [facility] from jail in September 2025 with a large subdural hematoma.  Underwent craniotomy with flap replacement on 10/27.  Since then multiple OR visits including 10/30 for SDH evacuation, follow-up CT head 11/8 showed fluid collection underneath the bone flap, back to the OR 11/10 and then bone flap was removed, cultures were obtained intraoperative growing MRSA, treating for infected bone flap, ID following and currently on IV vancomycin .  10/27 crani w/ flap replacement 10/29: off clevi, liberate SBP goal, Klonopin , lithium , Haldol  as needed discontinued.  Latuda  dose cut by half 10/30: repeat CT head with increased collection - going to OR this afternoon for evacuation 10/31: OR  for SDH evacuation.  Developed arm twitching.  Klonopin  restarted at half dose 11/1: More awake.  No complaint 11/2 transferred out of ICU. 11/4 cortrak removed 11/8 CT head showed fluid collection under bone flap, decision made to remove bone flap 11/10 to OR for L bone flap removal 11/11 repeat CT head stable  Assessment & Plan:   Principal Problem:   Subdural hematoma (HCC) Active Problems:   Bipolar disorder (HCC)   Unable to make decisions about medical treatment due to impaired mental capacity   Alcohol withdrawal (HCC)   Essential hypertension   Protein-calorie malnutrition, severe   Acute metabolic encephalopathy   Infection of craniotomy plate  1-Subdural hematoma Status post initial craniotomy 9/28 (Dr. 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 - Followed by neurosurgery.  Likely -Evaluated by infectious disease who recommends 6  weeks of IV vancomycin  and day 12/20-second/2025.  After completion of IV antibiotics he will need 8 weeks of doxycycline . - Will need PICC line placed closer to discharge date. - Completed 1 week of Keppra  - Sutures should come out December 1 Therapy recommended SNF, TOC following   2-Acute metabolic encephalopathy - At baseline, patient reportedly without cognitive impairment, was homeless and independent caring for self. - At some point during this hospitalization patient was deemed not to have capacity, due to encephalopathy and neurological deficits. - Per recent physicians notes patient appears to be able to make decision for himself moving forward. Will need to continue to monitor, capacity could fluctuates.   3-Bipolar disorder/akathisia/drug-induced parkinsonism: - Patient with long standing history of bipolar disorder. - Patient was on Haldol  and Latuda , developed facial tremors 11/20 concern for tardive dyskinesia. - EEG negative for seizures. - Latuda  and Haldol  discontinued. Patient was started on propranolol  and Ativan  as needed. Psych following and adjusting medication. On Ingrezza . Klonopin   Psych started patient on Valbezine, if not helping by 09/04/2024 psych will consider to discontinue it.   4-Dysphagia 11/29: Overnight after medications given with water patient felt something got stuck, he require suctioning.  -evaluated by speech. MBS: sign of aspiration.  Plan for clear liquid diet only  Speech following.  -IV fluids.   history of alcohol, tobacco use disorder: - Continue thiamine , folic acid  and multivitamin Treated for alcohol withdrawal delirium with phenobarbital  taper at [facility]. Resolved  Essential hypertension: -Blood pressure decrease with propranolol .  Amlodipine  and losartan  has been discontinued. Propranolol  dose decreased to avoid hypotension  Constipation: Continue laxatives.   Severe malnutrition: Continue ensure.   Iron  deficiency  anemia; started  iron supplement.   Oral Thrush; started nystatin    Nutrition Problem: Severe Malnutrition Etiology: acute illness    Signs/Symptoms: energy intake < or equal to 50% for > or equal to 5 days, severe muscle depletion, moderate fat depletion    Interventions: Tube feeding  Estimated body mass index is 21.13 kg/m as calculated from the following:   Height as of this encounter: 5' 10 (1.778 m).   Weight as of this encounter: 66.8 kg.   DVT prophylaxis: Heparin   Code Status: Full code Family Communication: care discussed with patient.  Disposition Plan:  Status is: Inpatient Remains inpatient appropriate because: awaiting placement.     Consultants:  Psych ID CCM Neurosurgery     Antimicrobials:  Vancomycin    Subjective: Required sitter, has been trying to get out bed.  Tremors appears improved.  He relates he does ok with clear liquids  Objective: Vitals:   09/01/24 0441 09/01/24 0458 09/01/24 0746 09/01/24 1149  BP: 135/81  (!) 138/95 127/84  Pulse: (!) 58   (!) 58  Resp: 20  18 18   Temp: 97.7 F (36.5 C)  (!) 97.5 F (36.4 C) 98.4 F (36.9 C)  TempSrc: Oral  Oral   SpO2: 95%  97% 97%  Weight:  66.8 kg    Height:        Intake/Output Summary (Last 24 hours) at 09/01/2024 1149 Last data filed at 09/01/2024 0906 Gross per 24 hour  Intake 0 ml  Output --  Net 0 ml   Filed Weights   08/30/24 0500 08/31/24 0718 09/01/24 0458  Weight: 66 kg 66.8 kg 66.8 kg    Examination:  General exam: NAD Respiratory system: CTA Cardiovascular system: S 1, S 2 RRR Gastrointestinal system: BS present, soft, nt Central nervous system: alert  Extremities: no edema  Data Reviewed: I have personally reviewed following labs and imaging studies  CBC: Recent Labs  Lab 08/28/24 0348 09/01/24 0445  WBC 6.1 4.3  HGB 11.9* 12.4*  HCT 36.5* 38.0*  MCV 90.8 89.6  PLT 276 262   Basic Metabolic Panel: Recent Labs  Lab 08/25/24 1235  08/26/24 0547 08/28/24 0348 08/30/24 0602  NA 140 142 142 144  K 4.1 4.3 4.3 4.3  CL 104 106 106 107  CO2 24 25 27 26   GLUCOSE 99 111* 108* 96  BUN 24* 21* 19 20  CREATININE 0.93 0.87 1.08 1.07  CALCIUM 9.3 9.5 9.5 9.6  MG  --  2.2  --   --   PHOS  --  3.9  --   --    GFR: Estimated Creatinine Clearance: 69.4 mL/min (by C-G formula based on SCr of 1.07 mg/dL). Liver Function Tests: Recent Labs  Lab 08/28/24 0348  AST 26  ALT 40  ALKPHOS 69  BILITOT 0.7  PROT 6.1*  ALBUMIN  3.1*   No results for input(s): LIPASE, AMYLASE in the last 168 hours. No results for input(s): AMMONIA in the last 168 hours. Coagulation Profile: No results for input(s): INR, PROTIME in the last 168 hours. Cardiac Enzymes: No results for input(s): CKTOTAL, CKMB, CKMBINDEX, TROPONINI in the last 168 hours. BNP (last 3 results) No results for input(s): PROBNP in the last 8760 hours. HbA1C: No results for input(s): HGBA1C in the last 72 hours. CBG: Recent Labs  Lab 08/31/24 0537 08/31/24 1206 08/31/24 1706 09/01/24 0042 09/01/24 0742  GLUCAP 96 103* 87 122* 85   Lipid Profile: No results for input(s): CHOL, HDL, LDLCALC,  TRIG, CHOLHDL, LDLDIRECT in the last 72 hours. Thyroid Function Tests: Recent Labs    08/30/24 0602  TSH 1.790   Anemia Panel: Recent Labs    08/30/24 0602  VITAMINB12 460  FOLATE >20.0  FERRITIN 13*  TIBC 434  IRON 36*  RETICCTPCT 2.0   Sepsis Labs: No results for input(s): PROCALCITON, LATICACIDVEN in the last 168 hours.  No results found for this or any previous visit (from the past 240 hours).       Radiology Studies: DG Swallowing Func-Speech Pathology Result Date: 08/31/2024 Table formatting from the original result was not included. Modified Barium Swallow Study Patient Details Name: ROSHAN ROBACK MRN: [id number] Date of Birth: [birth year] Today's Date: 08/31/2024 HPI/PMH: HPI: Patient is a 58 y.o. male who was  initially admitted to [facility] on 06/30/24 after an unresponsive episode in jail. He was found to have a large SDH. He underwent a left frontoparietal craniotomy with evacuation of SDH and drain placement 9/28 (drain removed 9/29). SLP was following patient at [facility] for cognitive-linguistic goals. He was on a regular solids, thin liquids diet prior to transfer to [facility] on 10/14 for further management of craniotomy and flap. On 10/27, he underwent cranioplasty with bone flap replacement. On 10/28 he had a change in cognitive status thought to be due to Klonopin  but repeat CTH showed significant increase in size of mixed attenuation subdural fluid collection underlying the left tempoparietal craniotomy site with associated mild mass effect and slight rightward midline shift. He underwent left burr hole for evacuation on 10/30. He underwent left bone flap removal on 11/10 after developing SDH s/p burr hole evacuation followed by swelling and suspected infection. 10/30 CXR without acute cardiopulmonary abnormality. SLP evaluated patient for swallow function at bedside on 10/28 recommending NPO due to change in mentation but diet initiated on 10/31 following PO trials. RN requested SLP reassess patient's swallowing on 11/18 due to concerns of coughing when drinking liquids. Patient reassessed at bedside and continuation of Dys 3, thin liquids diet recommended with carful monitoring. SLP swallow evaluation reordered on 11/28 due to RN observing patient to cough when taking medications. Clinical Impression: Clinical Impression: Patient presents with a severe oropharyngeal dysphagia as per this MBS. He exhibited silent aspiration (PAS 8) of gross amounts with thin liquids, nectar thick liquids and honey thick liquids and instances of frank aspiration of puree solid residuals observed as well. Anterior hyoid excursion appeared [facility], however patient exhibited minimal laryngeal elevation, no epiglottic inversion, incomplete laryngeal  vestibule closure. Aspiration occured during the swallow but also occured after the swallow from residuals spilling over arytenoid cartilage and into open airway. Aspiration was silent however patient did exhibit instances of sensation slightly after aspiration occured. Patient unable to effectively follow commands for strategies but even with SLP providing physical assist, he was unable to achieve adequate chin tuck posture. Cued secondary swallows did aid to clear mild amount of pharyngeal residuals but not effective to clear pyriform sinus or residuals on arytenoids. Cued throat clear was not effective to clear penetrated or aspirated barium. SLP recommending continue NPO status, medications crushed in puree. MD in agreement for CT chest to determine if patient's lungs are clear. Further recommendations pending CT chest. DIGEST Swallow Severity Rating*  Safety: 4  Efficiency: 2  Overall Pharyngeal Swallow Severity: 3 1: mild; 2: moderate; 3: severe; 4: profound *The Dynamic Imaging Grade of Swallowing Toxicity is standardized for the head and neck cancer population, however, demonstrates promising clinical applications across populations to  standardize the clinical rating of pharyngeal swallow safety and severity. Factors that may increase risk of adverse event in presence of aspiration Noe & Lianne 2021): Factors that may increase risk of adverse event in presence of aspiration Noe & Lianne 2021): Aspiration of thick, dense, and/or acidic materials; Frequent aspiration of large volumes; Reduced cognitive function Recommendations/Plan: Swallowing Evaluation Recommendations Swallowing Evaluation Recommendations Recommendations: NPO; NPO except meds Medication Administration: Crushed with puree Supervision: Full supervision/cueing for swallowing strategies Swallowing strategies  : Slow rate; Small bites/sips Oral care recommendations: Oral care BID (2x/day); Staff/trained caregiver to provide oral care  Treatment Plan Treatment Plan Treatment recommendations: Therapy as outlined in treatment plan below Follow-up recommendations: Skilled nursing-short term rehab (<3 hours/day) Functional status assessment: Patient has had a recent decline in their functional status and demonstrates the ability to make significant improvements in function in a reasonable and predictable amount of time. Treatment frequency: Min 2x/week Treatment duration: 2 weeks Interventions: Aspiration precaution training; Diet toleration management by SLP; Trials of upgraded texture/liquids Recommendations Recommendations for follow up therapy are one component of a multi-disciplinary discharge planning process, led by the attending physician.  Recommendations may be updated based on patient status, additional functional criteria and insurance authorization. Assessment: Orofacial Exam: Orofacial Exam Oral Cavity - Dentition: Poor condition; Missing dentition Oral Motor/Sensory Function: Other (comment) (facial, mandibular tremor) Anatomy: Anatomy: WFL Boluses Administered: Boluses Administered Boluses Administered: Thin liquids (Level 0); Mildly thick liquids (Level 2, nectar thick); Moderately thick liquids (Level 3, honey thick); Puree  Oral Impairment Domain: Oral Impairment Domain Lip Closure: Escape beyond mid-chin Tongue control during bolus hold: Not tested Bolus preparation/mastication: Slow prolonged chewing/mashing with complete recollection Bolus transport/lingual motion: Repetitive/disorganized tongue motion Oral residue: Residue collection on oral structures Location of oral residue : Tongue; Palate Initiation of pharyngeal swallow : Valleculae  Pharyngeal Impairment Domain: Pharyngeal Impairment Domain Soft palate elevation: No bolus between soft palate (SP)/pharyngeal wall (PW) Laryngeal elevation: Minimal superior movement of thyroid cartilage with minimal approximation of arytenoids to epiglottic petiole Anterior hyoid excursion:  Complete anterior movement Epiglottic movement: No inversion Laryngeal vestibule closure: Incomplete, narrow column air/contrast in laryngeal vestibule Pharyngeal stripping wave : Present - diminished Pharyngeal contraction (A/P view only): N/A Pharyngoesophageal segment opening: Complete distension and complete duration, no obstruction of flow Tongue base retraction: Trace column of contrast or air between tongue base and PPW Pharyngeal residue: Collection of residue within or on pharyngeal structures Location of pharyngeal residue: Tongue base; Valleculae; Aryepiglottic folds; Pyriform sinuses; Diffuse (>3 areas)  Esophageal Impairment Domain: Esophageal Impairment Domain Esophageal clearance upright position: Complete clearance, esophageal coating Pill: Pill Consistency administered: Puree Puree: Impaired (see clinical impressions) Penetration/Aspiration Scale Score: Penetration/Aspiration Scale Score 1.  Material does not enter airway: Pill; Solid 8.  Material enters airway, passes BELOW cords without attempt by patient to eject out (silent aspiration) : Thin liquids (Level 0); Mildly thick liquids (Level 2, nectar thick); Moderately thick liquids (Level 3, honey thick); Puree Compensatory Strategies: Compensatory Strategies Compensatory strategies: Yes Straw: Ineffective Ineffective Straw: Thin liquid (Level 0) Chin tuck: Ineffective Ineffective Chin Tuck: Thin liquid (Level 0); Mildly thick liquid (Level 2, nectar thick)   General Information: No data recorded Diet Prior to this Study: NPO   No data recorded  No data recorded  Supplemental O2: None (Room air)   History of Recent Intubation: Yes  Behavior/Cognition: Alert; Cooperative; Requires cueing; Lethargic/Drowsy Self-Feeding Abilities: Able to self-feed; Needs set-up for self-feeding Baseline vocal quality/speech: Normal Volitional Cough: Unable to elicit Volitional  Swallow: Able to elicit Exam Limitations: No limitations Goal Planning: Prognosis for  improved oropharyngeal function: Good Barriers to Reach Goals: Severity of deficits No data recorded Patient/Family Stated Goal: patient asking if he can still drink some liquids Consulted and agree with results and recommendations: Pt unable/family or caregiver not available Pain: Pain Assessment Pain Assessment: No/denies pain Pain Score: 0 Faces Pain Scale: 4 Pain Location: generalized, jaw and shoulders Pain Descriptors / Indicators: Discomfort Pain Intervention(s): Limited activity within patient's tolerance; Monitored during session; RN gave pain meds during session End of Session: Start Time:SLP Start Time (ACUTE ONLY): 1302 Stop Time: SLP Stop Time (ACUTE ONLY): 1318 Time Calculation:SLP Time Calculation (min) (ACUTE ONLY): 16 min Charges: SLP Evaluations $ SLP Speech Visit: 1 Visit SLP Evaluations $MBS Swallow: 1 Procedure $Swallowing Treatment: 1 Procedure SLP visit diagnosis: SLP Visit Diagnosis: Dysphagia, oropharyngeal phase (R13.12) Past Medical History: Past Medical History: Diagnosis Date  Anxiety   Bipolar 1 disorder (HCC)   Depression   Hepatitis   Hypertension   MRSA (methicillin resistant Staphylococcus aureus)   5/25 arm abscess and 11/25 Past Surgical History: Past Surgical History: Procedure Laterality Date  BURR HOLE N/A 08/01/2024  Procedure: CREATION, CRANIAL BURR HOLE WITH EVACUATION OF HEMATOMA;  Surgeon: Rosslyn Dino HERO, MD;  Location: [facility] OR;  Service: Neurosurgery;  Laterality: N/A;  BURR HOLE N/A 08/12/2024  Procedure: left bone flap removal;  Surgeon: Rosslyn Dino HERO, MD;  Location: [facility] OR;  Service: Neurosurgery;  Laterality: N/A;  CRANIOPLASTY, WITH BONE FLAP REPLACEMENT Left 07/29/2024  Procedure: CRANIOPLASTY, WITH BONE FLAP REPLACEMENT;  Surgeon: Rosslyn Dino HERO, MD;  Location: [facility] OR;  Service: Neurosurgery;  Laterality: Left;  LEFT CRANIOPLASTY WITH ARTIFICAL BONE FLAP REPLACEMENT  CRANIOTOMY Left 06/30/2024  Procedure: CRANIOTOMY HEMATOMA EVACUATION SUBDURAL;  Surgeon:  Melonie Grass, MD;  Location: [facility] ORS;  Service: Neurosurgery;  Laterality: Left; Norleen IVAR Blase, MA, CCC-SLP Speech Therapy 08/31/2024, 2:37 PM  DG CHEST PORT 1 VIEW Result Date: 08/31/2024 EXAM: 1 VIEW(S) XRAY OF THE CHEST 08/31/2024 02:00:00 PM COMPARISON: 08/01/2024 CLINICAL HISTORY: Cough. FINDINGS: LUNGS AND PLEURA: No focal pulmonary opacity. No pleural effusion. No pneumothorax. HEART AND MEDIASTINUM: No acute abnormality of the cardiac and mediastinal silhouettes. BONES AND SOFT TISSUES: No acute osseous abnormality. IMPRESSION: 1. No acute cardiopulmonary process. Electronically signed by: Lynwood Seip MD 08/31/2024 02:22 PM EST RP Workstation: [id number]        Scheduled Meds:  Chlorhexidine  Gluconate Cloth  6 each Topical Daily   clonazePAM   2 mg Oral TID   docusate  100 mg Oral Daily   feeding supplement  237 mL Oral BID BM   ferrous sulfate  325 mg Oral Q breakfast   folic acid   1 mg Oral Daily   gabapentin   200 mg Oral TID   heparin  injection (subcutaneous)  5,000 Units Subcutaneous Q8H   mirtazapine   15 mg Oral QHS   multivitamin with minerals  1 tablet Oral Daily   nicotine   14 mg Transdermal Daily   nystatin   5 mL Oral QID   pantoprazole   40 mg Oral QHS   polyethylene glycol  17 g Oral Daily   propranolol   20 mg Oral BID   senna  2 tablet Oral Daily   sodium chloride  flush  10-40 mL Intracatheter Q12H   thiamine   100 mg Oral Daily   valbenazine   80 mg Oral Daily   Continuous Infusions:  vancomycin  750 mg (09/01/24 1033)     LOS: 48 days  Time spent: 35 Minutes    Micharl Helmes DELENA Lore, MD [facility]   If 7PM-7AM, please contact night-coverage [email or web address]  09/01/2024, 11:49 AM

## 2024-09-02 ENCOUNTER — Inpatient Hospital Stay (HOSPITAL_COMMUNITY): Payer: MEDICAID

## 2024-09-02 LAB — SEDIMENTATION RATE: Sed Rate: 37 mm/h — ABNORMAL HIGH (ref 0–16)

## 2024-09-02 LAB — BASIC METABOLIC PANEL WITH GFR
Anion gap: 4 — ABNORMAL LOW (ref 5–15)
BUN: 24 mg/dL — ABNORMAL HIGH (ref 6–20)
CO2: 29 mmol/L (ref 22–32)
Calcium: 9.4 mg/dL (ref 8.9–10.3)
Chloride: 106 mmol/L (ref 98–111)
Creatinine, Ser: 1 mg/dL (ref 0.61–1.24)
GFR, Estimated: >60 mL/min (ref >60)
Glucose, Bld: 95 mg/dL (ref 70–99)
Potassium: 3.8 mmol/L (ref 3.5–5.1)
Sodium: 139 mmol/L (ref 135–145)

## 2024-09-02 LAB — GLUCOSE, CAPILLARY
Glucose-Capillary: 118 mg/dL — ABNORMAL HIGH (ref 70–99)
Glucose-Capillary: 92 mg/dL (ref 70–99)

## 2024-09-02 LAB — C-REACTIVE PROTEIN: CRP: 1.3 mg/dL — ABNORMAL HIGH (ref <1.0)

## 2024-09-02 MED ORDER — SODIUM CHLORIDE 0.9 % IV SOLN
3.0000 g | Freq: Four times a day (QID) | INTRAVENOUS | Status: AC
  Administered 2024-09-02 – 2024-09-07 (×20): 3 g via INTRAVENOUS
  Filled 2024-09-02 (×20): qty 8

## 2024-09-02 MED ORDER — LACTATED RINGERS IV SOLN: INTRAVENOUS | Status: DC

## 2024-09-02 MED ORDER — LACTATED RINGERS IV BOLUS
500.0000 mL | Freq: Once | INTRAVENOUS | Status: AC
  Administered 2024-09-02: 500 mL via INTRAVENOUS

## 2024-09-02 NOTE — Progress Notes (Signed)
 Physical Therapy Treatment Patient Details Name: Ernest Romero MRN: [id number] DOB: [birth year] Today's Date: 09/02/2024   History of Present Illness Pt is a 58 y.o. male presenting 10/14 from [facility] where he was admitted [facility] in [facility]. 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 On CIWA protocol. Transferred to [facility] for further management of craniotomy and flap. 10/27 s/p cranioplasty with bone flap replacement. 10/28 Change in status thought to be related to Klonopin  administration 10/28, repeat CTH shows significant increase in size of mixed attenuation subdural fluid collection underlying the L tempoparietal craniotomy site, now measuring approximately 15 mm in thickness, with associated mild mass effect and slight rightward midline shift. OR on 10/30 for SDH evacuation. [facility] 11/7 with fluid collected under bone flap. 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 PMH alcohol use disorder, psychiatric disorder, HTN    PT Comments  Patient with limited progress and needing encouragement to participate.  Patient seemed distressed after not being able to eat much with full liquid diet today and some audible signs of aspiration.  PT will continue to follow and progress as able.     If plan is discharge home, recommend the following: A lot of help with bathing/dressing/bathroom;Assistance with cooking/housework;Direct supervision/assist for medications management;Direct supervision/assist for financial management;Assist for transportation;Help with stairs or ramp for entrance;Supervision due to cognitive status;A little help with walking and/or transfers   Can travel by private vehicle        Equipment Recommendations  Wheelchair (measurements PT);Wheelchair cushion (measurements PT);Rolling walker (2 wheels)    Recommendations for Other Services       Precautions / Restrictions  Precautions Precautions: Fall Precaution/Restrictions Comments: SBP <160, helmet (pt to wear when OOB per RN)     Mobility  Bed Mobility Overal bed mobility: Needs Assistance Bed Mobility: Sit to Supine       Sit to supine: Mod assist   General bed mobility comments: assist for legs into bed and for trunk repositioning, NT assist to scoot up in bed    Transfers Overall transfer level: Needs assistance Equipment used: 1 person hand held assist Transfers: Sit to/from Stand Sit to Stand: Min assist           General transfer comment: assist for anterior weight shift, for balance, HHA for safety    Ambulation/Gait Ambulation/Gait assistance: Min assist Gait Distance (Feet): 30 Feet Assistive device: 1 person hand held assist Gait Pattern/deviations: Step-through pattern, Decreased stride length, Narrow base of support, Shuffle       General Gait Details: declined hallway ambulation despite much encouragement, agreed to walk to door; assist for balance and direction with HHA, wearing helmet throughout   Stairs             Wheelchair Mobility     Tilt Bed    Modified Rankin (Stroke Patients Only)       Balance Overall balance assessment: Needs assistance Sitting-balance support: Feet supported Sitting balance-Leahy Scale: Fair   Postural control: Posterior lean Standing balance support: Single extremity supported Standing balance-Leahy Scale: Poor Standing balance comment: cues for chin tuck, anterior weight shift in standing                            Communication Communication Communication: Impaired Factors Affecting Communication: Difficulty expressing self  Cognition Arousal: Alert Behavior During Therapy: Flat affect  PT - Cognitive impairments: Memory, Initiation, Problem solving, Safety/Judgement, Sequencing                         Following commands: Impaired Following commands impaired: Follows one step  commands with increased time, Follows multi-step commands inconsistently    Cueing Cueing Techniques: Verbal cues  Exercises      General Comments General comments (skin integrity, edema, etc.): coughing upon PT entry with sitter in the room and pt with full liquid tray.  She reported pt could not eat hardly at all and noted some congestion though improved after ambulation.      Pertinent Vitals/Pain Pain Assessment Pain Assessment: No/denies pain    Home Living                          Prior Function            PT Goals (current goals can now be found in the care plan section) Progress towards PT goals: Progressing toward goals    Frequency    Min 2X/week      PT Plan      Co-evaluation              AM-PAC PT 6 Clicks Mobility   Outcome Measure  Help needed turning from your back to your side while in a flat bed without using bedrails?: A Little Help needed moving from lying on your back to sitting on the side of a flat bed without using bedrails?: A Lot Help needed moving to and from a bed to a chair (including a wheelchair)?: A Lot Help needed standing up from a chair using your arms (e.g., wheelchair or bedside chair)?: A Little Help needed to walk in hospital room?: A Little Help needed climbing 3-5 steps with a railing? : Total 6 Click Score: 14    End of Session Equipment Utilized During Treatment: Gait belt Activity Tolerance: Patient limited by fatigue Patient left: in bed;with call bell/phone within reach;with nursing/sitter in room   PT Visit Diagnosis: Other symptoms and signs involving the nervous system (R29.898);Muscle weakness (generalized) (M62.81)     Time: 8684-8665 PT Time Calculation (min) (ACUTE ONLY): 19 min  Charges:    $Gait Training: 8-22 mins PT General Charges $$ ACUTE PT VISIT: 1 Visit                     Micheline Romero, PT Acute Rehabilitation Services Office:[phone] 09/02/2024    Ernest Romero 09/02/2024, 4:22 PM

## 2024-09-02 NOTE — Progress Notes (Signed)
 OT Cancellation Note  Patient Details Name: KEION NEELS MRN: [id number] DOB: 1966-02-18   Cancelled Treatment:    Reason Eval/Treat Not Completed: Fatigue/lethargy limiting ability to participate Per staff, pt recently received meds and has been deeply sleeping since then. Unable to awaken to participate in OT session. Will follow up as schedule permits.  Mliss Fish 09/02/2024, 11:17 AM

## 2024-09-02 NOTE — Plan of Care (Signed)

## 2024-09-02 NOTE — Progress Notes (Addendum)
 PROGRESS NOTE    Ernest Romero  FMW:[id number] DOB: Mar 30, 1966 DOA: 07/15/2024 PCP: [name]   Brief Narrative: 58 year old homeless, medical history significant for alcohol use disorder, bipolar and panic disorder, initially presented to [facility] from jail in September 2025 with a large subdural hematoma.  Underwent craniotomy with flap replacement on 10/27.  Since then multiple OR visits including 10/30 for SDH evacuation, follow-up CT head 11/8 showed fluid collection underneath the bone flap, back to the OR 11/10 and then bone flap was removed, cultures were obtained intraoperative growing MRSA, treating for infected bone flap, ID following and currently on IV vancomycin .  10/27 crani w/ flap replacement 10/29: off clevi, liberate SBP goal, Klonopin , lithium , Haldol  as needed discontinued.  Latuda  dose cut by half 10/30: repeat CT head with increased collection - going to OR this afternoon for evacuation 10/31: OR  for SDH evacuation.  Developed arm twitching.  Klonopin  restarted at half dose 11/1: More awake.  No complaint 11/2 transferred out of ICU. 11/4 cortrak removed 11/8 CT head showed fluid collection under bone flap, decision made to remove bone flap 11/10 to OR for L bone flap removal 11/11 repeat CT head stable 11/29: notice to have worsening Dysphagia.  12/1 repeat CT head:   Assessment & Plan:   Principal Problem:   Subdural hematoma (HCC) Active Problems:   Bipolar disorder (HCC)   Unable to make decisions about medical treatment due to impaired mental capacity   Alcohol withdrawal (HCC)   Essential hypertension   Protein-calorie malnutrition, severe   Acute metabolic encephalopathy   Infection of craniotomy plate  1-Subdural hematoma Status post initial craniotomy 9/28 (Dr. 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 - Followed by  neurosurgery.  Likely -Evaluated by infectious disease who recommends 6 weeks of IV vancomycin  and day 09/23/2024.  After completion of IV antibiotics he will need 8 weeks of doxycycline . - Will need PICC line placed closer to discharge date. - Completed 1 week of Keppra  - Sutures should come out December 1. Sent message to Neurosurgery [name] Therapy recommended SNF, TOC following Will repeat CT head, Inflammatory marker.   2-Acute metabolic encephalopathy - At baseline, patient reportedly without cognitive impairment, was homeless and independent caring for self. - At some point during this hospitalization patient was deemed not to have capacity, due to encephalopathy and neurological deficits. - Per recent physicians notes patient appears to be able to make decision for himself moving forward. Will need to continue to monitor, capacity could fluctuates.  12/01: Seem more sleepy today, just received medications. Plan to check CT head , due to worsening dysphagia.   3-Bipolar disorder/akathisia/drug-induced parkinsonism: - Patient with long standing history of bipolar disorder. - Patient was on Haldol  and Latuda , developed facial tremors 11/20 concern for tardive dyskinesia. - EEG negative for seizures. - Latuda  and Haldol  discontinued. Patient was started on propranolol  and Ativan  as needed. Psych following and adjusting medication. -On Ingrezza . Klonopin   -Psych started patient on Valbezine, if not helping by 09/04/2024 psych will consider to discontinue it.   4-Dysphagia 11/29: Overnight after medications given with water patient felt something got stuck, he require suctioning.  -evaluated by speech. MBS: sign of aspiration.  - on full liquid diet. Monitor closely for any sign of aspiration.  Speech following.  -IV fluids.  -check CT head.   history of alcohol, tobacco use  disorder: - Continue thiamine , folic acid  and multivitamin Treated for alcohol withdrawal delirium with  phenobarbital  taper at [facility]. Resolved  Essential hypertension: -Blood pressure decrease with propranolol .  Amlodipine  and losartan  has been discontinued. Propranolol  dose decreased to avoid hypotension  Constipation: Continue laxatives.   Severe malnutrition: Continue ensure.   Iron deficiency anemia; started  iron supplement.   Oral Thrush; started nystatin    Nutrition Problem: Severe Malnutrition Etiology: acute illness    Signs/Symptoms: energy intake < or equal to 50% for > or equal to 5 days, severe muscle depletion, moderate fat depletion    Interventions: Tube feeding  Estimated body mass index is 21.13 kg/m as calculated from the following:   Height as of this encounter: 5' 10 (1.778 m).   Weight as of this encounter: 66.8 kg.   DVT prophylaxis: Heparin   Code Status: Full code Family Communication: care discussed with patient.  Disposition Plan:  Status is: Inpatient Remains inpatient appropriate because: awaiting placement.     Consultants:  Psych ID CCM Neurosurgery     Antimicrobials:  Vancomycin    Subjective: He is sleepy, keep eyes close, would say yes and no.    Objective: Vitals:   09/02/24 0340 09/02/24 0701 09/02/24 0800 09/02/24 0909  BP: 107/73  120/88 117/76  Pulse: (!) 50  (!) 50 93  Resp: 16  19   Temp: (!) 97 F (36.1 C)  98.2 F (36.8 C)   TempSrc: Axillary  Skin   SpO2: 95%  96% 96%  Weight:  66.8 kg    Height:        Intake/Output Summary (Last 24 hours) at 09/02/2024 1149 Last data filed at 09/01/2024 1857 Gross per 24 hour  Intake 300 ml  Output --  Net 300 ml   Filed Weights   08/31/24 0718 09/01/24 0458 09/02/24 0701  Weight: 66.8 kg 66.8 kg 66.8 kg    Examination:  General exam: NAD Respiratory system: Ronchus right side Cardiovascular system: S 1, S 2 RRR Gastrointestinal system: BS present, soft, nt Central nervous system: sleepy , would say few words.  Extremities: No edema  Data Reviewed:  I have personally reviewed following labs and imaging studies  CBC: Recent Labs  Lab 08/28/24 0348 09/01/24 0445  WBC 6.1 4.3  HGB 11.9* 12.4*  HCT 36.5* 38.0*  MCV 90.8 89.6  PLT 276 262   Basic Metabolic Panel: Recent Labs  Lab 08/28/24 0348 08/30/24 0602 09/02/24 0347  NA 142 144 139  K 4.3 4.3 3.8  CL 106 107 106  CO2 27 26 29   GLUCOSE 108* 96 95  BUN 19 20 24*  CREATININE 1.08 1.07 1.00  CALCIUM 9.5 9.6 9.4   GFR: Estimated Creatinine Clearance: 74.2 mL/min (by C-G formula based on SCr of 1 mg/dL). Liver Function Tests: Recent Labs  Lab 08/28/24 0348  AST 26  ALT 40  ALKPHOS 69  BILITOT 0.7  PROT 6.1*  ALBUMIN  3.1*   No results for input(s): LIPASE, AMYLASE in the last 168 hours. No results for input(s): AMMONIA in the last 168 hours. Coagulation Profile: No results for input(s): INR, PROTIME in the last 168 hours. Cardiac Enzymes: No results for input(s): CKTOTAL, CKMB, CKMBINDEX, TROPONINI in the last 168 hours. BNP (last 3 results) No results for input(s): PROBNP in the last 8760 hours. HbA1C: No results for input(s): HGBA1C in the last 72 hours. CBG: Recent Labs  Lab 08/31/24 1706 09/01/24 0042 09/01/24 0742 09/01/24 1544 09/01/24 2356  GLUCAP 87 122* 85  101* 97   Lipid Profile: No results for input(s): CHOL, HDL, LDLCALC, TRIG, CHOLHDL, LDLDIRECT in the last 72 hours. Thyroid Function Tests: No results for input(s): TSH, T4TOTAL, FREET4, T3FREE, THYROIDAB in the last 72 hours.  Anemia Panel: No results for input(s): VITAMINB12, FOLATE, FERRITIN, TIBC, IRON, RETICCTPCT in the last 72 hours.  Sepsis Labs: No results for input(s): PROCALCITON, LATICACIDVEN in the last 168 hours.  No results found for this or any previous visit (from the past 240 hours).       Radiology Studies: DG Swallowing Func-Speech Pathology Result Date: 08/31/2024 Table formatting from the original  result was not included. Modified Barium Swallow Study Patient Details Name: Ernest Romero MRN: [id number] Date of Birth: 06/10/1966 Today's Date: 08/31/2024 HPI/PMH: HPI: Patient is a 58 y.o. male who was initially admitted to [facility] on 06/30/24 after an unresponsive episode in jail. He was found to have a large SDH. He underwent a left frontoparietal craniotomy with evacuation of SDH and drain placement 9/28 (drain removed 9/29). SLP was following patient at [facility] for cognitive-linguistic goals. He was on a regular solids, thin liquids diet prior to transfer to [facility] on 10/14 for further management of craniotomy and flap. On 10/27, he underwent cranioplasty with bone flap replacement. On 10/28 he had a change in cognitive status thought to be due to Klonopin  but repeat CTH showed significant increase in size of mixed attenuation subdural fluid collection underlying the left tempoparietal craniotomy site with associated mild mass effect and slight rightward midline shift. He underwent left burr hole for evacuation on 10/30. He underwent left bone flap removal on 11/10 after developing SDH s/p burr hole evacuation followed by swelling and suspected infection. 10/30 CXR without acute cardiopulmonary abnormality. SLP evaluated patient for swallow function at bedside on 10/28 recommending NPO due to change in mentation but diet initiated on 10/31 following PO trials. RN requested SLP reassess patient's swallowing on 11/18 due to concerns of coughing when drinking liquids. Patient reassessed at bedside and continuation of Dys 3, thin liquids diet recommended with carful monitoring. SLP swallow evaluation reordered on 11/28 due to RN observing patient to cough when taking medications. Clinical Impression: Clinical Impression: Patient presents with a severe oropharyngeal dysphagia as per this MBS. He exhibited silent aspiration (PAS 8) of gross amounts with thin liquids, nectar thick liquids and honey thick liquids and instances  of frank aspiration of puree solid residuals observed as well. Anterior hyoid excursion appeared [facility], however patient exhibited minimal laryngeal elevation, no epiglottic inversion, incomplete laryngeal vestibule closure. Aspiration occured during the swallow but also occured after the swallow from residuals spilling over arytenoid cartilage and into open airway. Aspiration was silent however patient did exhibit instances of sensation slightly after aspiration occured. Patient unable to effectively follow commands for strategies but even with SLP providing physical assist, he was unable to achieve adequate chin tuck posture. Cued secondary swallows did aid to clear mild amount of pharyngeal residuals but not effective to clear pyriform sinus or residuals on arytenoids. Cued throat clear was not effective to clear penetrated or aspirated barium. SLP recommending continue NPO status, medications crushed in puree. MD in agreement for CT chest to determine if patient's lungs are clear. Further recommendations pending CT chest. DIGEST Swallow Severity Rating*  Safety: 4  Efficiency: 2  Overall Pharyngeal Swallow Severity: 3 1: mild; 2: moderate; 3: severe; 4: profound *The Dynamic Imaging Grade of Swallowing Toxicity is standardized for the head and neck cancer population, however, demonstrates promising  clinical applications across populations to standardize the clinical rating of pharyngeal swallow safety and severity. Factors that may increase risk of adverse event in presence of aspiration Noe & Lianne 2021): Factors that may increase risk of adverse event in presence of aspiration Noe & Lianne 2021): Aspiration of thick, dense, and/or acidic materials; Frequent aspiration of large volumes; Reduced cognitive function Recommendations/Plan: Swallowing Evaluation Recommendations Swallowing Evaluation Recommendations Recommendations: NPO; NPO except meds Medication Administration: Crushed with puree  Supervision: Full supervision/cueing for swallowing strategies Swallowing strategies  : Slow rate; Small bites/sips Oral care recommendations: Oral care BID (2x/day); Staff/trained caregiver to provide oral care Treatment Plan Treatment Plan Treatment recommendations: Therapy as outlined in treatment plan below Follow-up recommendations: Skilled nursing-short term rehab (<3 hours/day) Functional status assessment: Patient has had a recent decline in their functional status and demonstrates the ability to make significant improvements in function in a reasonable and predictable amount of time. Treatment frequency: Min 2x/week Treatment duration: 2 weeks Interventions: Aspiration precaution training; Diet toleration management by SLP; Trials of upgraded texture/liquids Recommendations Recommendations for follow up therapy are one component of a multi-disciplinary discharge planning process, led by the attending physician.  Recommendations may be updated based on patient status, additional functional criteria and insurance authorization. Assessment: Orofacial Exam: Orofacial Exam Oral Cavity - Dentition: Poor condition; Missing dentition Oral Motor/Sensory Function: Other (comment) (facial, mandibular tremor) Anatomy: Anatomy: WFL Boluses Administered: Boluses Administered Boluses Administered: Thin liquids (Level 0); Mildly thick liquids (Level 2, nectar thick); Moderately thick liquids (Level 3, honey thick); Puree  Oral Impairment Domain: Oral Impairment Domain Lip Closure: Escape beyond mid-chin Tongue control during bolus hold: Not tested Bolus preparation/mastication: Slow prolonged chewing/mashing with complete recollection Bolus transport/lingual motion: Repetitive/disorganized tongue motion Oral residue: Residue collection on oral structures Location of oral residue : Tongue; Palate Initiation of pharyngeal swallow : Valleculae  Pharyngeal Impairment Domain: Pharyngeal Impairment Domain Soft palate elevation:  No bolus between soft palate (SP)/pharyngeal wall (PW) Laryngeal elevation: Minimal superior movement of thyroid cartilage with minimal approximation of arytenoids to epiglottic petiole Anterior hyoid excursion: Complete anterior movement Epiglottic movement: No inversion Laryngeal vestibule closure: Incomplete, narrow column air/contrast in laryngeal vestibule Pharyngeal stripping wave : Present - diminished Pharyngeal contraction (A/P view only): N/A Pharyngoesophageal segment opening: Complete distension and complete duration, no obstruction of flow Tongue base retraction: Trace column of contrast or air between tongue base and PPW Pharyngeal residue: Collection of residue within or on pharyngeal structures Location of pharyngeal residue: Tongue base; Valleculae; Aryepiglottic folds; Pyriform sinuses; Diffuse (>3 areas)  Esophageal Impairment Domain: Esophageal Impairment Domain Esophageal clearance upright position: Complete clearance, esophageal coating Pill: Pill Consistency administered: Puree Puree: Impaired (see clinical impressions) Penetration/Aspiration Scale Score: Penetration/Aspiration Scale Score 1.  Material does not enter airway: Pill; Solid 8.  Material enters airway, passes BELOW cords without attempt by patient to eject out (silent aspiration) : Thin liquids (Level 0); Mildly thick liquids (Level 2, nectar thick); Moderately thick liquids (Level 3, honey thick); Puree Compensatory Strategies: Compensatory Strategies Compensatory strategies: Yes Straw: Ineffective Ineffective Straw: Thin liquid (Level 0) Chin tuck: Ineffective Ineffective Chin Tuck: Thin liquid (Level 0); Mildly thick liquid (Level 2, nectar thick)   General Information: No data recorded Diet Prior to this Study: NPO   No data recorded  No data recorded  Supplemental O2: None (Room air)   History of Recent Intubation: Yes  Behavior/Cognition: Alert; Cooperative; Requires cueing; Lethargic/Drowsy Self-Feeding Abilities: Able to  self-feed; Needs set-up for self-feeding Baseline vocal quality/speech: Normal  Volitional Cough: Unable to elicit Volitional Swallow: Able to elicit Exam Limitations: No limitations Goal Planning: Prognosis for improved oropharyngeal function: Good Barriers to Reach Goals: Severity of deficits No data recorded Patient/Family Stated Goal: patient asking if he can still drink some liquids Consulted and agree with results and recommendations: Pt unable/family or caregiver not available Pain: Pain Assessment Pain Assessment: No/denies pain Pain Score: 0 Faces Pain Scale: 4 Pain Location: generalized, jaw and shoulders Pain Descriptors / Indicators: Discomfort Pain Intervention(s): Limited activity within patient's tolerance; Monitored during session; RN gave pain meds during session End of Session: Start Time:SLP Start Time (ACUTE ONLY): 1302 Stop Time: SLP Stop Time (ACUTE ONLY): 1318 Time Calculation:SLP Time Calculation (min) (ACUTE ONLY): 16 min Charges: SLP Evaluations $ SLP Speech Visit: 1 Visit SLP Evaluations $MBS Swallow: 1 Procedure $Swallowing Treatment: 1 Procedure SLP visit diagnosis: SLP Visit Diagnosis: Dysphagia, oropharyngeal phase (R13.12) Past Medical History: Past Medical History: Diagnosis Date  Anxiety   Bipolar 1 disorder (HCC)   Depression   Hepatitis   Hypertension   MRSA (methicillin resistant Staphylococcus aureus)   5/25 arm abscess and 11/25 Past Surgical History: Past Surgical History: Procedure Laterality Date  BURR HOLE N/A 08/01/2024  Procedure: CREATION, CRANIAL BURR HOLE WITH EVACUATION OF HEMATOMA;  Surgeon: Rosslyn Dino HERO, MD;  Location: [facility] OR;  Service: Neurosurgery;  Laterality: N/A;  BURR HOLE N/A 08/12/2024  Procedure: left bone flap removal;  Surgeon: Rosslyn Dino HERO, MD;  Location: [facility] OR;  Service: Neurosurgery;  Laterality: N/A;  CRANIOPLASTY, WITH BONE FLAP REPLACEMENT Left 07/29/2024  Procedure: CRANIOPLASTY, WITH BONE FLAP REPLACEMENT;  Surgeon: Rosslyn Dino HERO, MD;   Location: [facility] OR;  Service: Neurosurgery;  Laterality: Left;  LEFT CRANIOPLASTY WITH ARTIFICAL BONE FLAP REPLACEMENT  CRANIOTOMY Left 06/30/2024  Procedure: CRANIOTOMY HEMATOMA EVACUATION SUBDURAL;  Surgeon: Melonie Grass, MD;  Location: [facility] ORS;  Service: Neurosurgery;  Laterality: Left; Norleen IVAR Blase, MA, CCC-SLP Speech Therapy 08/31/2024, 2:37 PM  DG CHEST PORT 1 VIEW Result Date: 08/31/2024 EXAM: 1 VIEW(S) XRAY OF THE CHEST 08/31/2024 02:00:00 PM COMPARISON: 08/01/2024 CLINICAL HISTORY: Cough. FINDINGS: LUNGS AND PLEURA: No focal pulmonary opacity. No pleural effusion. No pneumothorax. HEART AND MEDIASTINUM: No acute abnormality of the cardiac and mediastinal silhouettes. BONES AND SOFT TISSUES: No acute osseous abnormality. IMPRESSION: 1. No acute cardiopulmonary process. Electronically signed by: Lynwood Seip MD 08/31/2024 02:22 PM EST RP Workstation: [id number]        Scheduled Meds:  Chlorhexidine  Gluconate Cloth  6 each Topical Daily   clonazePAM   2 mg Oral TID   docusate  100 mg Oral Daily   feeding supplement  237 mL Oral BID BM   ferrous sulfate  325 mg Oral Q breakfast   folic acid   1 mg Oral Daily   gabapentin   200 mg Oral TID   heparin  injection (subcutaneous)  5,000 Units Subcutaneous Q8H   mirtazapine   15 mg Oral QHS   multivitamin with minerals  1 tablet Oral Daily   nicotine   14 mg Transdermal Daily   nystatin   5 mL Oral QID   pantoprazole   40 mg Oral QHS   polyethylene glycol  17 g Oral Daily   propranolol   20 mg Oral BID   senna  2 tablet Oral Daily   sodium chloride  flush  10-40 mL Intracatheter Q12H   thiamine   100 mg Oral Daily   valbenazine   80 mg Oral Daily   Continuous Infusions:  vancomycin  750 mg (09/02/24 0920)     LOS:  49 days    Time spent: 35 Minutes    Dejanee Thibeaux A Yasmin Dibello, MD [facility]   If 7PM-7AM, please contact night-coverage [email or web address]  09/02/2024, 11:49 AM

## 2024-09-02 NOTE — Progress Notes (Addendum)
 Speech Language Pathology Treatment: Dysphagia  Ernest Romero Details Name: Ernest Romero MRN: [id number] DOB: November 16, 1965 Today's Date: 09/02/2024 Time: 9080-9058 SLP Time Calculation (min) (ACUTE ONLY): 22 min  Assessment / Plan / Recommendation Clinical Impression  Ernest Romero was seen for dysphagia tx this am.  Nursing in room to give meds.  Results of MBS reviewed. Assisted Ernest Romero with taking meds crushed in applesauce and drinking thin liquids.  He demonstrated extensive coughing and wet congestion after most oral intake.  He was found to aspirate on MBS on 11/29 and is clearly continuing to do so. D/W RN.    For today, recommend resuming NPO status until we can: 1) identify reason for acute change in swallow function and 2) determine whether it is in Ernest Romero's interest to resume a PO diet in the setting of current aspiration and co-morbidities that might make him more susceptible to developing a pna.  D/W evaluating SLP and MD. Ernest Romero will be seen again next date.  He may have ice chips/small sips water  after oral care; critical meds crushed in puree.    HPI HPI: Ernest Romero is a 58 y.o. male who was initially admitted to [facility] on 06/30/24 after an unresponsive episode in jail. He was found to have a large SDH. He underwent a left frontoparietal craniotomy with evacuation of SDH and drain placement 9/28 (drain removed 9/29). SLP was following Ernest Romero at [facility] for cognitive-linguistic goals. He was on a regular solids, thin liquids diet prior to transfer to [facility] on 10/14 for further management of craniotomy and flap. On 10/27, he underwent cranioplasty with bone flap replacement. On 10/28 he had a change in cognitive status thought to be due to Klonopin  but repeat CTH showed significant increase in size of mixed attenuation subdural fluid collection underlying the left tempoparietal craniotomy site with associated mild mass effect and slight rightward midline shift. He underwent left burr hole for evacuation on 10/30. He underwent  left bone flap removal on 11/10 after developing SDH s/p burr hole evacuation followed by swelling and suspected infection. 10/30 CXR without acute cardiopulmonary abnormality. SLP evaluated Ernest Romero for swallow function at bedside on 10/28 recommending NPO due to change in mentation but diet initiated on 10/31 following PO trials. RN requested SLP reassess Ernest Romero's swallowing on 11/18 due to concerns of coughing when drinking liquids. Ernest Romero reassessed at bedside and continuation of Dys 3, thin liquids diet recommended with carful monitoring. SLP swallow evaluation reordered on 11/28 due to RN observing Ernest Romero to cough when taking medications.      SLP Plan             Recommendations  Diet recommendations: NPO Liquids provided via: Cup;Straw Medication Administration: Crushed with puree Compensations: Minimize environmental distractions;Slow rate;Small sips/bites Postural Changes and/or Swallow Maneuvers: Seated upright 90 degrees                  Oral care BID   Frequent or constant Supervision/Assistance Dysphagia, oropharyngeal phase (R13.12)         Analuisa Tudor L. Vona, MA CCC/SLP Clinical Specialist - Acute Care SLP Acute Rehabilitation Services Office number [phone]   [name]  09/02/2024, 3:32 PM

## 2024-09-03 LAB — GLUCOSE, CAPILLARY
Glucose-Capillary: 89 mg/dL (ref 70–99)
Glucose-Capillary: 79 mg/dL (ref 70–99)

## 2024-09-03 LAB — BLOOD GAS, ARTERIAL
Acid-Base Excess: 3.3 mmol/L — ABNORMAL HIGH (ref 0.0–2.0)
Bicarbonate: 27.8 mmol/L (ref 20.0–28.0)
O2 Saturation: 99.7 %
Patient temperature: 36.1
pCO2 arterial: 39 mmHg (ref 32–48)
pH, Arterial: 7.45 (ref 7.35–7.45)
pO2, Arterial: 75 mmHg — ABNORMAL LOW (ref 83–108)

## 2024-09-03 LAB — MAGNESIUM: Magnesium: 1.8 mg/dL (ref 1.7–2.4)

## 2024-09-03 LAB — BASIC METABOLIC PANEL WITH GFR
Anion gap: 9 (ref 5–15)
BUN: 23 mg/dL — ABNORMAL HIGH (ref 6–20)
CO2: 25 mmol/L (ref 22–32)
Calcium: 9.1 mg/dL (ref 8.9–10.3)
Chloride: 106 mmol/L (ref 98–111)
Creatinine, Ser: 1.08 mg/dL (ref 0.61–1.24)
GFR, Estimated: >60 mL/min (ref >60)
Glucose, Bld: 96 mg/dL (ref 70–99)
Potassium: 3.8 mmol/L (ref 3.5–5.1)
Sodium: 140 mmol/L (ref 135–145)

## 2024-09-03 LAB — CBC
HCT: 35.7 % — ABNORMAL LOW (ref 39.0–52.0)
Hemoglobin: 11.7 g/dL — ABNORMAL LOW (ref 13.0–17.0)
MCH: 29.3 pg (ref 26.0–34.0)
MCHC: 32.8 g/dL (ref 30.0–36.0)
MCV: 89.5 fL (ref 80.0–100.0)
Platelets: 239 K/uL (ref 150–400)
RBC: 3.99 MIL/uL — ABNORMAL LOW (ref 4.22–5.81)
RDW: 12.7 % (ref 11.5–15.5)
WBC: 3.9 K/uL — ABNORMAL LOW (ref 4.0–10.5)
nRBC: 0 % (ref 0.0–0.2)

## 2024-09-03 MED ORDER — CLONAZEPAM 1 MG PO TABS
1.0000 mg | ORAL_TABLET | Freq: Three times a day (TID) | ORAL | Status: DC
  Administered 2024-09-03 – 2024-09-04 (×5): 1 mg via ORAL
  Filled 2024-09-03 (×5): qty 1

## 2024-09-03 MED ORDER — PHENOL 1.4 % MT LIQD
1.0000 | OROMUCOSAL | Status: AC | PRN
  Administered 2024-09-03: 1 via OROMUCOSAL
  Filled 2024-09-03: qty 177

## 2024-09-03 NOTE — Plan of Care (Signed)
 Patient is impulsive, 1:1 sitter at bedside, patient follows commands, anxious at times. Able to take critical medications with apple sauce, slight cough after taking medications, oral secretions suctioned, directed patient to use incentive spirometer during rounds. Pt able to cough up secretions a couple of times. Placed on 2L on O2 as per MD. Patient unable to control jaw movements, MD notified. Speech evaluation pending. Continue plan of care.

## 2024-09-03 NOTE — Progress Notes (Signed)
 PROGRESS NOTE    Ernest Romero  FMW:[id number] DOB: 06-13-1966 DOA: 07/15/2024 PCP: [name]   Brief Narrative: 58 year old homeless, medical history significant for alcohol use disorder, bipolar and panic disorder, initially presented to [facility] from jail in September 2025 with a large subdural hematoma.  Underwent craniotomy with flap replacement on 10/27.  Since then multiple OR visits including 10/30 for SDH evacuation, follow-up CT head 11/8 showed fluid collection underneath the bone flap, back to the OR 11/10 and then bone flap was removed, cultures were obtained intraoperative growing MRSA, treating for infected bone flap, ID following and currently on IV vancomycin .  10/27 crani w/ flap replacement 10/29: off clevi, liberate SBP goal, Klonopin , lithium , Haldol  as needed discontinued.  Latuda  dose cut by half 10/30: repeat CT head with increased collection - going to OR this afternoon for evacuation 10/31: OR  for SDH evacuation.  Developed arm twitching.  Klonopin  restarted at half dose 11/1: More awake.  No complaint 11/2 transferred out of ICU. 11/4 cortrak removed 11/8 CT head showed fluid collection under bone flap, decision made to remove bone flap 11/10 to OR for L bone flap removal 11/11 repeat CT head stable 11/29: notice to have worsening Dysphagia.  12/1 repeat CT head: stable.   Assessment & Plan:   Principal Problem:   Subdural hematoma (HCC) Active Problems:   Bipolar disorder (HCC)   Unable to make decisions about medical treatment due to impaired mental capacity   Alcohol withdrawal (HCC)   Essential hypertension   Protein-calorie malnutrition, severe   Acute metabolic encephalopathy   Infection of craniotomy plate  1-Subdural hematoma Status post initial craniotomy 9/28 (Dr. 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 - Followed by  neurosurgery. -Evaluated by infectious disease who recommends 6 weeks of IV vancomycin  and day 09/23/2024.  After completion of IV antibiotics he will need 8 weeks of doxycycline . - Will need PICC line placed closer to discharge date. - Completed 1 week of Keppra  - Sutures should come out December 1. [name] will remove suture this week.  -Therapy recommended SNF, TOC following   2-Acute metabolic encephalopathy - At baseline, patient reportedly without cognitive impairment, was homeless and independent caring for self. - At some point during this hospitalization patient was deemed not to have capacity, due to encephalopathy and neurological deficits. - Per recent physicians notes patient appears to be able to make decision for himself moving forward. -Continue to monitor, capacity could fluctuates.  -12/01: Patient more lethargic, CT head repeated evolving post operative finding, no acute intracranial abnormalities , Inflammatory marker trending down CRP 1.3.  -Suspect lethargic related to Klonopin -- psych informed of change in condition. Plan to reduce Klonopin  to 1 mg TID. Component of hypoxia as well.   3-Bipolar disorder/akathisia/drug-induced parkinsonism: - Patient with long standing history of bipolar disorder. - Patient was on Haldol  and Latuda , developed facial tremors 11/20 concern for tardive dyskinesia. - EEG negative for seizures. - Latuda  and Haldol  discontinued. Patient was started on propranolol  and Klonopin  Psych following and adjusting medication. -On Ingrezza . Klonopin   -Psych started patient on Valbezine, if not helping by 09/04/2024 psych will consider to discontinue it.  Klonopin  reduce to 1 Mg TID due to oversedation.   4-Dysphagia 11/29: Overnight after medications given with water patient felt something got stuck, he require suctioning.  -evaluated by speech. MBS: sign of aspiration.  Speech following.  -IV fluids.  - CT head. Stable. Suspect component of  oversedation due to klonopin .  Speech to follow up on patient on reduce dose of klonopin ., if by tomorrow unable to start diet, might need core track for feeding.   Aspiration PNA;  Acute hypoxic Resp failure.  -In setting of worsening dysphagia, has cough.  -Incentive spirometry.  -Start IV Unasyn.  -Chest x ray showed BL increasing atelectasis.  ABG O2 70, place on 2 L.   history of alcohol, tobacco use disorder: - Continue thiamine , folic acid  and multivitamin Treated for alcohol withdrawal delirium with phenobarbital  taper at [facility]. Resolved  Essential hypertension: -Blood pressure decrease with propranolol .  Amlodipine  and losartan  has been discontinued. Propranolol  dose decreased to avoid hypotension  Constipation: Continue laxatives.   Bradycardia; holder parameter for Propanolol   Severe malnutrition: Continue ensure.   Iron deficiency anemia; started  iron supplement.   Oral Thrush; started nystatin    Nutrition Problem: Severe Malnutrition Etiology: acute illness    Signs/Symptoms: energy intake < or equal to 50% for > or equal to 5 days, severe muscle depletion, moderate fat depletion    Interventions: Tube feeding  Estimated body mass index is 21.13 kg/m as calculated from the following:   Height as of this encounter: 5' 10 (1.778 m).   Weight as of this encounter: 66.8 kg.   DVT prophylaxis: Heparin   Code Status: Full code Family Communication: care discussed with patient.  Disposition Plan:  Status is: Inpatient Remains inpatient appropriate because: awaiting placement.     Consultants:  Psych ID CCM Neurosurgery     Antimicrobials:  Vancomycin    Subjective: He is more alert today, has not received klonopin  this morning.  Report jaw tremors.  Wants to get out of bed. He has required sitter over last few, high fall risk.   Objective: Vitals:   09/02/24 2158 09/02/24 2351 09/03/24 0759 09/03/24 1118  BP: 112/76 (!) 143/87 124/89  133/88  Pulse: (!) 48 (!) 55 (!) 58 (!) 52  Resp: 18 19 18 19   Temp: 97.7 F (36.5 C) 97.7 F (36.5 C) (!) 97 F (36.1 C) 98.1 F (36.7 C)  TempSrc: Oral Oral  Oral  SpO2: 96% 95% 94% 97%  Weight:      Height:        Intake/Output Summary (Last 24 hours) at 09/03/2024 1425 Last data filed at 09/03/2024 0659 Gross per 24 hour  Intake 1220.02 ml  Output 1000 ml  Net 220.02 ml   Filed Weights   08/31/24 0718 09/01/24 0458 09/02/24 0701  Weight: 66.8 kg 66.8 kg 66.8 kg    Examination:  General exam: NAD Respiratory system: BL air movement. BL ronchus.  Cardiovascular system: S 1, S 2 RRR Gastrointestinal system: BS present, soft, nt Central nervous system: more alert Extremities: no edema  Data Reviewed: I have personally reviewed following labs and imaging studies  CBC: Recent Labs  Lab 08/28/24 0348 09/01/24 0445 09/03/24 0131  WBC 6.1 4.3 3.9*  HGB 11.9* 12.4* 11.7*  HCT 36.5* 38.0* 35.7*  MCV 90.8 89.6 89.5  PLT 276 262 239   Basic Metabolic Panel: Recent Labs  Lab 08/28/24 0348 08/30/24 0602 09/02/24 0347 09/03/24 0131  NA 142 144 139 140  K 4.3 4.3 3.8 3.8  CL 106 107 106 106  CO2 27 26 29 25   GLUCOSE 108* 96 95 96  BUN 19 20 24* 23*  CREATININE 1.08 1.07 1.00 1.08  CALCIUM 9.5 9.6 9.4 9.1  MG  --   --   --  1.8   GFR: Estimated Creatinine Clearance: 68.7 mL/min (by C-G formula based on SCr of 1.08 mg/dL). Liver Function Tests: Recent Labs  Lab 08/28/24 0348  AST 26  ALT 40  ALKPHOS 69  BILITOT 0.7  PROT 6.1*  ALBUMIN  3.1*   No results for input(s): LIPASE, AMYLASE in the last 168 hours. No results for input(s): AMMONIA in the last 168 hours. Coagulation Profile: No results for input(s): INR, PROTIME in the last 168 hours. Cardiac Enzymes: No results for input(s): CKTOTAL, CKMB, CKMBINDEX, TROPONINI in the last 168 hours. BNP (last 3 results) No results for input(s): PROBNP in the last 8760 hours. HbA1C: No  results for input(s): HGBA1C in the last 72 hours. CBG: Recent Labs  Lab 09/01/24 1544 09/01/24 2356 09/02/24 1159 09/02/24 2135 09/03/24 1116  GLUCAP 101* 97 118* 92 89   Lipid Profile: No results for input(s): CHOL, HDL, LDLCALC, TRIG, CHOLHDL, LDLDIRECT in the last 72 hours. Thyroid Function Tests: No results for input(s): TSH, T4TOTAL, FREET4, T3FREE, THYROIDAB in the last 72 hours.  Anemia Panel: No results for input(s): VITAMINB12, FOLATE, FERRITIN, TIBC, IRON, RETICCTPCT in the last 72 hours.  Sepsis Labs: No results for input(s): PROCALCITON, LATICACIDVEN in the last 168 hours.  No results found for this or any previous visit (from the past 240 hours).       Radiology Studies: CT HEAD WO CONTRAST ( ) Result Date: 09/02/2024 EXAM: CT HEAD WITHOUT 09/02/2024 05:48:09 PM TECHNIQUE: CT of the head was performed without the administration of intravenous contrast. Automated exposure control, iterative reconstruction, and/or weight based adjustment of the mA/kV was utilized to reduce the radiation dose to as low as reasonably achievable. COMPARISON: Head CT 08/13/2024. CLINICAL HISTORY: Altered mental status, nontraumatic (Ped 0-17y); Subdural hematoma. FINDINGS: BRAIN AND VENTRICLES: Sequelae of left sided hemicraniectomy are again identified. Drains on the prior CT have been removed. Pneumocephalus has resolved. No sizable residual subdural collection is evident. A small amount of low density extradural fluid and soft tissue thickening along the craniectomy have decreased. There is chronic dural thickening. No definite acute intracranial hemorrhage, acute infarct, or hydrocephalus is evident. There is no significant midline shift. ORBITS: No acute abnormality. SINUSES AND MASTOIDS: Persistent bilateral mastoid and left middle ear fluid, decreased from prior. Mild mucosal thickening in the included portion of the left maxillary sinus. SOFT  TISSUES AND SKULL: No acute skull fracture. No acute soft tissue abnormality. IMPRESSION: 1. Evolving postoperative changes from left sided hemicraniectomy and bone flap removal without evidence of an acute intracranial abnormality or significant residual subdural collection. Electronically signed by: Dasie Hamburg MD 09/02/2024 06:15 PM EST RP Workstation: [id number]   DG CHEST PORT 1 VIEW Result Date: 09/02/2024 CLINICAL DATA:  Cough. EXAM: PORTABLE CHEST 1 VIEW COMPARISON:  Radiograph 2 days ago 09/30/2024 FINDINGS: Low lung volumes. Stable heart size and mediastinal contours. Mild atelectasis at the lung bases, increasing. No confluent opacity. No pulmonary edema, pleural effusion, or pneumothorax. Remote left rib fractures. IMPRESSION: Low lung volumes with increasing bibasilar atelectasis. Electronically Signed   By: Andrea Gasman M.D.   On: 09/02/2024 15:40        Scheduled Meds:  Chlorhexidine  Gluconate Cloth  6 each Topical Daily   clonazePAM   1 mg Oral TID   docusate  100 mg Oral Daily   feeding supplement  237 mL Oral BID BM   ferrous sulfate  325 mg Oral Q breakfast   folic acid   1 mg Oral Daily   gabapentin   200 mg Oral TID   heparin  injection (subcutaneous)  5,000 Units Subcutaneous Q8H   mirtazapine   15 mg Oral QHS   multivitamin with minerals  1 tablet Oral Daily   nicotine   14 mg Transdermal Daily   nystatin   5 mL Oral QID   pantoprazole   40 mg Oral QHS   polyethylene glycol  17 g Oral Daily   propranolol   20 mg Oral BID   senna  2 tablet Oral Daily   sodium chloride  flush  10-40 mL Intracatheter Q12H   thiamine   100 mg Oral Daily   valbenazine   80 mg Oral Daily   Continuous Infusions:  ampicillin-sulbactam (UNASYN) IV 3 g (09/03/24 1048)   lactated ringers  75 mL/hr at 09/02/24 2124   vancomycin  750 mg (09/03/24 1128)     LOS: 50 days    Time spent: 35 Minutes    Karynn Deblasi A Kanetra Ho, MD [facility]   If 7PM-7AM, please contact  night-coverage [email or web address]  09/03/2024, 2:25 PM

## 2024-09-03 NOTE — Progress Notes (Signed)
 Speech Language Pathology Treatment: Dysphagia  Patient Details Name: Ernest Romero MRN: [id number] DOB: 01/12/1966 Today's Date: 09/03/2024 Time: 1300-1320 SLP Time Calculation (min) (ACUTE ONLY): 20 min  Assessment / Plan / Recommendation Clinical Impression  Patient seen by SLP for skilled treatment focused on dysphagia goals. He was awake, alert and receptive to PO trials. SLP and sitter assisted patient with transfer from bed to recliner chair. He was able to brush his teeth with setup assistance. Voice sounded clear when speaking but cued or uncued coughs sounded congested and were largely non-productive. SLP assessed his swallow with cup sips of thin liquids (water). Patient did not exhibit immediate cough but delayed cough which was congested sounding and non-productive. SLP recommending continue NPO status, continue allowing water sips, ice chips and will follow for PO readiness, anticipating a repeat MBS when patient's alertness has improved.      HPI HPI: Patient is a 58 y.o. male who was initially admitted to [facility] on 06/30/24 after an unresponsive episode in jail. He was found to have a large SDH. He underwent a left frontoparietal craniotomy with evacuation of SDH and drain placement 9/28 (drain removed 9/29). SLP was following patient at [facility] for cognitive-linguistic goals. He was on a regular solids, thin liquids diet prior to transfer to [facility] on 10/14 for further management of craniotomy and flap. On 10/27, he underwent cranioplasty with bone flap replacement. On 10/28 he had a change in cognitive status thought to be due to Klonopin  but repeat CTH showed significant increase in size of mixed attenuation subdural fluid collection underlying the left tempoparietal craniotomy site with associated mild mass effect and slight rightward midline shift. He underwent left burr hole for evacuation on 10/30. He underwent left bone flap removal on 11/10 after developing SDH s/p burr hole evacuation  followed by swelling and suspected infection. 10/30 CXR without acute cardiopulmonary abnormality. SLP evaluated patient for swallow function at bedside on 10/28 recommending NPO due to change in mentation but diet initiated on 10/31 following PO trials with patient's alertness improving and a dysphagia 3 (mechanical soft) solids, thin liquids diet was recommended. RN requested SLP reassess patient's swallowing on 11/18 due to concerns of coughing when drinking liquids. Patient reassessed at bedside and continuation of Dys 3, thin liquids diet recommended with careful monitoring. SLP swallow evaluation reordered on 11/28 due to RN observing patient to cough when taking medications leading to MD changing patient to NPO awaiting SLP reassessment. MBS completed on 11/29 which reported severe oropharyngeal dysphagia with aspiration of all liquids and puree solids. SLP recommended NPO status awaiting repeat chest imaging. MD and SLP in agreement to initiate PO diet of full liquids/thin consistency after repeat chest imaging indicating patient's lungs were clear. Patient reportedly continued to struggle with PO diet and decision was made for NPO status but allowing ice chips and water sips PRN. Repeat CTH on 12/1 reported: Evolving postoperative changes from left sided hemicraniectomy and bone flap removal without evidence of an acute intracranial abnormality or significant residual subdural collection. MD reduced patient's Clonazepam  dose in hopes of improving his alertness.      SLP Plan  Continue with current plan of care        Swallow Evaluation Recommendations         Recommendations  Diet recommendations: NPO Supervision: Staff to assist with self feeding;Full supervision/cueing for compensatory strategies Postural Changes and/or Swallow Maneuvers: Seated upright 90 degrees  Oral care BID;Oral care prior to ice chip/H20   Frequent or constant  Supervision/Assistance Dysphagia, oropharyngeal phase (R13.12)     Continue with current plan of care     Norleen IVAR Blase, MA, CCC-SLP Speech Therapy   09/03/2024, 3:14 PM

## 2024-09-03 NOTE — Progress Notes (Addendum)
 Nutrition Follow-up  DOCUMENTATION CODES:   Severe malnutrition in context of acute illness/injury  INTERVENTION:  If the patient passes SLP swallow re-eval, resume regular diet (patient prefers Dysphagia 3.) Continue Ensure Plus High Protein PO BID. Each supplement provides 350 Kcals and 20 grams of protein. Continue multivitamin, thiamine , and folic acid  daily. Continue mirtazapine . If the patient fails swallow eval, recommend Cortrak placement and Osmolite 1.5 at 60 mL/hr goal plus 1 packet ProSource TF20 daily for 1440 mL formula, 2240 Kcals, 110 g protein, 293 g carbohydrates and 1097 mL free water daily.    NUTRITION DIAGNOSIS:   Severe Malnutrition related to acute illness as evidenced by energy intake < or equal to 50% for > or equal to 5 days, severe muscle depletion, moderate fat depletion - ongoing    GOAL:   Patient will meet greater than or equal to 90% of their needs - progressing    MONITOR:   TF tolerance, Labs, Weight trends  REASON FOR ASSESSMENT:   Malnutrition Screening Tool, NPO/Clear Liquid Diet    ASSESSMENT:   PMH significant for ETOH abuse, HTN, bipolar disorder, anxiety/depression, hepatitis, craniotomy 06/30/24 for SDH during incarceration, presented on 10/13 for management of craniotomy s/p craniotomy with flap replacement 10/27.  09/28 admitted to [facility] with SDH s/p post prontoparietal craniotomy with evacuation 10/13 transferred to [facility] 10/27 s/p L allograft cranioplasty with flap replacement 10/29 Cortrak tube placed; xray with tip in descending duodenum  10/31 OR for SDH evacuation; diet advanced to Dysphagia 3; eating 75% of meals 11/02 Tube feeds discontinued  11/03 Transferred to progressive  11/04 Cortrak removed 11/10 OR for L bone flap removal, transferred to ICU 11/11 Transferred back to progressive  11/12 Continues to eat 75-100% meals, SLP passed patient for regular solids but patient prefers bite-sized Dys3 diet 11/25 Continued  >75% meal intake charted 11/28 Diet changed to NPO due to coughing with medications 11/29 Failed MBS, SLP recommends continued NPO 12/01 Continues to fail swallow, NPO continues, repeat CT head due to increased drowsiness - results benign  12/02: SLP unable to repeat swallow eval this morning due to patient's sleepiness. The patient has now been NPOX4 days. Discussed with attending regarding re-initiation of enteral nutrition - provider notes the patient seems more alert at this juncture so SLP will re-eval. Update: the patient failed SLP swallow eval again today. D/W attending who has reduced clonazepam  with hopes mentation might improve tomorrow. Cortrak team will be on tomorrow if tube placement is necessary then.  Scheduled Meds:  Chlorhexidine  Gluconate Cloth  6 each Topical Daily   clonazePAM   2 mg Oral TID   docusate  100 mg Oral Daily   feeding supplement  237 mL Oral BID BM   ferrous sulfate  325 mg Oral Q breakfast   folic acid   1 mg Oral Daily   gabapentin   200 mg Oral TID   heparin  injection (subcutaneous)  5,000 Units Subcutaneous Q8H   mirtazapine   15 mg Oral QHS   multivitamin with minerals  1 tablet Oral Daily   nicotine   14 mg Transdermal Daily   nystatin   5 mL Oral QID   pantoprazole   40 mg Oral QHS   polyethylene glycol  17 g Oral Daily   propranolol   20 mg Oral BID   senna  2 tablet Oral Daily   sodium chloride  flush  10-40 mL Intracatheter Q12H   thiamine   100 mg Oral Daily   valbenazine   80 mg Oral Daily   Continuous Infusions:  ampicillin-sulbactam (UNASYN) IV 3 g (09/03/24 0601)   lactated ringers  75 mL/hr at 09/02/24 2124   vancomycin  750 mg (09/02/24 2159)     Diet Order             Diet NPO time specified Except for: Ice Chips  Diet effective now                  Meal Intake: N/A; was consistently 75-100% up until 11/28 NPO  Labs:     Latest Ref Rng & Units 09/03/2024    1:31 AM 09/02/2024    3:47 AM 08/30/2024    6:02 AM  CMP  Glucose  70 - 99 mg/dL 96  95  96   BUN 6 - 20 mg/dL 23  24  20    Creatinine 0.61 - 1.24 mg/dL 8.91  8.99  8.92   Sodium 135 - 145 mmol/L 140  139  144   Potassium 3.5 - 5.1 mmol/L 3.8  3.8  4.3   Chloride 98 - 111 mmol/L 106  106  107   CO2 22 - 32 mmol/L 25  29  26    Calcium 8.9 - 10.3 mg/dL 9.1  9.4  9.6       I/O: -9 L since admit  NUTRITION - FOCUSED PHYSICAL EXAM:  Flowsheet Row Most Recent Value  Orbital Region Mild depletion  Upper Arm Region Moderate depletion  Thoracic and Lumbar Region Mild depletion  Buccal Region Moderate depletion  Temple Region Moderate depletion  Clavicle Bone Region Severe depletion  Clavicle and Acromion Bone Region Severe depletion  Scapular Bone Region Moderate depletion  Dorsal Hand Mild depletion  Patellar Region Severe depletion  Anterior Thigh Region Severe depletion  Posterior Calf Region Severe depletion  Edema (RD Assessment) None  Hair Reviewed  Eyes Reviewed  Mouth Reviewed  Skin Reviewed  Nails Reviewed    EDUCATION NEEDS:   Not appropriate for education at this time  Skin:  Skin Assessment: Skin Integrity Issues: Skin Integrity Issues:: Incisions Incisions: closed surgical incision to head  Last BM:  11/29 type 6  Height:   Ht Readings from Last 1 Encounters:  08/12/24 5' 10 (1.778 m)    Weight:   Stable weight this admission  Weight Change: 12 Kg (15%) loss in 3 months  Edema: none  Ideal Body Weight:  75.5 kg   BMI:  Body mass index is 21.13 kg/m.  Estimated Nutritional Needs:  Kcal:  2100-2400 Protein:  110-130 Fluid:  2100-2400    Ernest Ruth, MS, RDN, LDN [place]. [facility] See AMION for contact information Secure chat preferred

## 2024-09-03 NOTE — Consult Note (Signed)
 [facility] Psychiatric Consult Follow-up  Patient Name: .Ernest Romero  MRN: [id number]  DOB: 01/22/1966  Consult Order details:  Orders (From admission, onward)     Start     Ordered   08/22/24 0842  IP CONSULT TO PSYCHIATRY       Ordering Provider: Verdene Purchase, MD  Provider:  (Not yet assigned)  Question Answer Comment  Location [facility]   Reason for Consult? patient with h/o bipolar d/o. was on lithium . here with subdural hematoma and MRSA. improving. some facial trmors. Need help with medication management. he was on lithium , lurasidone . requiring haldol  prn.  thanks      08/22/24 0842   08/02/24 1712  IP CONSULT TO PSYCHIATRY       Ordering Provider: Adolph Tinnie FORBES, PA-C  Provider:  (Not yet assigned)  Question Answer Comment  Location [facility]   Reason for Consult? bipolar      08/02/24 1711   07/24/24 1656  IP CONSULT TO PSYCHIATRY       Ordering Provider: Patsy Lenis, MD  Provider:  (Not yet assigned)  Question Answer Comment  Location [facility]   Reason for Consult? needing formal eval for capacity; may need guardianship given no family/support for discharge      07/24/24 1656   07/16/24 1412  IP CONSULT TO PSYCHIATRY       Ordering Provider: Dino Antu, MD  Provider:  (Not yet assigned)  Question Answer Comment  Location [facility]   Reason for Consult? 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  MDD, recurrent, severe, without psychotic  features 3.  Stimulant use disorder, in controlled setting Rule out traumatic brain injury from chronic subdural hematomas and neurosurgical procedures and complications Rule out bipolar disorder but likely previous manic episodes were meth induced per chart review  Assessment  Ernest Romero is a 58 y.o. male admitted: Medicallyfor 07/15/2024 11:15 PM for recurrent subdural hematoma. He carries the psychiatric diagnoses of bipolar disorder and alcohol use disorder and has a past medical history of subdural hematoma status postcraniotomy.   Current differential includes drug-induced parkinsonism as most likely issue that is slowly responding to medications.  DIP is most consistent with the characteristics of tremor including the frequency of the tremor being slower and consistent, being present at rest, and resolving with patient focusing on stopping it or being distracted.  Similarly to this there is some concern patient might have rapid syndrome which should be treated similarly to DIP.  Due to concern for drug-induced parkinsonism continuing benztropine  1 mg twice daily.  Furthermore there is concern for akathisia as patient has significant subjective anxiety and restlessness and some reports that akathisia can present in the perioral area similar to patient.  Patient has had questionable response to benzos and propranolol .  Plan to have primary team titrate propranolol  as tolerated for blood pressure so that this medication is present regardless and given it would help patient with his anxiety.  Furthermore plan to increase benzodiazepines as patient has  only been on a very small dose in order to rule out akathisia and help symptomatically while patient is given more time off antipsychotics.  Also some increasing concern for tardive tremor which is not characteristic of the tremor given these appear is more of a choreatic movements and less of a rhythmic movement but would warrant trialing with  Ingrezza  if not responding to current regimen and being off antipsychotics in a couple more days. Movement also disappears in sleep.   Lower suspicion is functional tremor.  Other causes are must rule out so we are planning to treat regardless of this.  Patient is noted to have this tremor even when he is not being watched.  This is diagnosis of exclusion.   Regarding next steps we will continue to monitor patient on higher dose of clonazepam  and continue the propranolol  and benztropine .  Furthermore we will talk with the nurse to see if patient is having the tremors in his sleep which would be more suggestive of tardive dyskinesia.  09/03/24 He has some mild improvement over last several days per patient report. Patient reports worsening mood symptoms of depression (7 out of 10) and anxiety (9 out of 10) due to ongoing perioral movements.  Given concern for tardive dyskinesia titrating valbenazine , been on 80 mg x3 days with some improvement and will continue for now.  Regard would continue utilizing the benzodiazepines, propranolol , and mirtazapine . Will decrease from Klonipin 2 mg to 1 mg TID due to oversedation, lethargy and difficulty with swallowing. Patient has had several scheduled doses held over the last 2 days and he tolerated 1 mg TID dosing better with less sedation effects.    Diagnoses:  Active Hospital problems: Principal Problem:   Subdural hematoma (HCC) Active Problems:   Bipolar disorder (HCC)   Essential hypertension   Alcohol withdrawal (HCC)   Unable to make decisions about medical treatment due to impaired mental capacity   Protein-calorie malnutrition, severe   Acute metabolic encephalopathy   Infection of craniotomy plate    Plan   ## Psychiatric Medication Recommendations:  Continue mirtazapine  15 mg once daily at bedtime for akathisia, insomnia, mood; monitor for mania switching but has tolerated >1 wk including benefit for jaw movement at night and  sleep Continue propranolol  as can be helpful for akithisia if present, can help anxiety, and primary team can titrate for HTN Continue clonazepam  1 mg three times daily for possible akathisia Ativan  0.5 PRN could potentially be d/c soon Continue valbenazine  40 mg for 3 days then increase to 80 mg (on 11/30) for possible tardive dyskinesia; patient will continue at current dose for now given some mild improvement will continue to follow with and assess for further improvement Consider pregabalin for TBI / anxiety in future Resolutions of symptoms can take up to 7 days so we will continue to monitor.  ## Medical Decision Making Capacity: Not specifically addressed in this encounter  ## Further Work-up:  -- TSH 1 month ago 0.775 While pt on Qtc prolonging medications, please monitor & replete K+ to 4 and Mg2+ to 2 -- most recent EKG on 11/19 had QtC of 414 -- Pertinent labwork reviewed earlier this admission includes: GFR greater than 60, LFT relatively within normal limits mildly elevated ALT, hemoglobin 11-12  ## Disposition:--defer to patient talking with primary team. We would recommend holding off discharge until we have seen how patient has trialed valbenazine  but could also be follow up with psychiatry at SNF. If pt is having substantial distress  from this experience this could be a reason to defer discharge but defer to primary team discussion with pt.   ## Behavioral / Environmental: -Delirium Precautions: Delirium Interventions for Nursing and Staff: - RN to open blinds every AM. - To Bedside: Glasses, hearing aide, and pt's own shoes. Make available to patients. when possible and encourage use. - Encourage po fluids when appropriate, keep fluids within reach. - OOB to chair with meals. - Passive ROM exercises to all extremities with AM & PM care. - RN to assess orientation to person, time and place QAM and PRN. - Recommend extended visitation hours with familiar family/friends as feasible.  - Staff to minimize disturbances at night. Turn off television when pt asleep or when not in use.    ## Safety and Observation Level:  - Based on my clinical evaluation, I estimate the patient to be at low risk of self harm in the current setting. - At this time, we recommend  routine. This decision is based on my review of the chart including patient's history and current presentation, interview of the patient, mental status examination, and consideration of suicide risk including evaluating suicidal ideation, plan, intent, suicidal or self-harm behaviors, risk factors, and protective factors. This judgment is based on our ability to directly address suicide risk, implement suicide prevention strategies, and develop a safety plan while the patient is in the clinical setting. Please contact our team if there is a concern that risk level has changed.  CSSR Risk Category:C-SSRS RISK CATEGORY: No Risk  Suicide Risk Assessment: Patient has following modifiable risk factors for suicide: untreated depression, which we are addressing by medication adjustments. Patient has following non-modifiable or demographic risk factors for suicide: Male and psychiatric hospitalization Patient has the following protective factors against suicide: n/a  Thank you for this consult request. Recommendations have been communicated to the primary team.  We will continue to follow at this time.   PATTI OLDEN, MD       History of Present Illness  Relevant Aspects of [facility] Course per hospitalist:  PCCM Xfer 36/50. 58 year old male, homeless, medical history significant for alcohol use disorder, bipolar and panic disorder, [facility] September 2025 with a large subdural hematoma.  Underwent craniotomy with flap replacement on 10/27.  Since then multiple OR visits including 10/30 for [facility] evacuation, follow-up CT head 11/8 showed fluid collection underneath the bone flap, back to OR  on 11/10 and the bone flap was removed, cultures were obtained, intraoperative cultures grew MRSA, treating for infected bone flap, ID on board and currently on IV vancomycin . Refer to PCCM progress note from 11/11 for detailed tabulation of hospital events until then.  Patient Report:  Reports that he is feeling ok to leave hospital now with his tremors but it is still bothering him and occurring all the time. Reports tooth pain that is limiting his ability to eat. Reports that he is sleepy.    Psych ROS on initial assesment:  Depression: Reports he has been having depressed mood, anhedonia, worthlessness, at lowest in his life, but denies suicidal thoughts but does have morbid rumination Anxiety: Reports that he is an anxious person but that the anxiety has been much worse since being the hospital.  Says that he feels restless and is having issues sleeping because of it.  Reports that he is anxious about his situation where he will go from here. Mania (lifetime and current): Endorses having manic episodes but when asking about  these episodes he does not describe any increased goal-directed activity, denies any decreased need for sleep, but does endorse feeling better, talking more, and impulsivity.  Denies having any dysfunction during these times.  Denies having any distractibility, grandiosity, flight of ideas, risky behaviors. Psychosis: (lifetime and current): Denies AVH or paranoia. Sleep: Reports that he cannot sleep with the shaking and in general   Psychiatric and Social History  Psychiatric History:  Information collected from patient and chart review  Prev Dx/Sx: Bipolar disorder, methamphetamine use, substance-induced mood disorder, alcohol use disorder Current Psych Provider: None Home Meds (current): Restarted on Latuda  40 and titrated to 80 this hospitalization, was not taking anything prior to admission in the short period that  He was away from hospital.  Also restarted on  Klonopin  Previous Med Trials: Lithium , Klonopin , Latuda , Haldol  as needed for agitation Therapy: None  Prior Psych Hospitalization: Previous hospitalizations although the psychiatric hospitalizations are not documented but the emergency department visits prior to have been documented and include episodes documented as mania but are occurring in the presence of positive amphetamine Prior Self Harm: Denies Prior Violence: Denies  Family Psych History: Denies Family Hx suicide: Denies  Social History:  Reports that he has been experiencing homelessness.  Does not report any recent incarceration which appears to conflict with previous chart review.  Does report he has a upcoming court date on December 7.  Denies access to weapons.  Reports that his hobby is making rock music.  Reports that he has a friend who is his international aid/development worker. Access to weapons/lethal means: Denies  Substance History Denies using any substances recently.  Has been in controlled setting.  Does have history of using stimulants but he denies when asked during interview.  Exam Findings  Physical Exam:  Vital Signs:  Temp:  [97 F (36.1 C)-98.6 F (37 C)] 97 F (36.1 C) (12/02 0759) Pulse Rate:  [43-58] 58 (12/02 0759) Resp:  [18-19] 18 (12/02 0759) BP: (88-143)/(66-89) 124/89 (12/02 0759) SpO2:  [94 %-96 %] 94 % (12/02 0759) Blood pressure 124/89, pulse (!) 58, temperature (!) 97 F (36.1 C), resp. rate 18, height 5' 10 (1.778 m), weight 66.8 kg, SpO2 94%. Body mass index is 21.13 kg/m.  Physical Exam Constitutional:      Comments: Chronic ill appearing  Pulmonary:     Effort: Pulmonary effort is normal.  Neurological:     Comments: No pain or stiffness with passive ROM of bilateral upper extremities.   Tremors of mouth including horizontal movements that go aware with sleep or distraction. Involuntary movements of tongue with protrusion. Occasional tremors of hands toward beginning but now has cessated.        Mental Status Exam: General Appearance: Patient is present in his room sitting in chair beside,neurosurgical scar on head,  Orientation: Oriented to person place   Memory: Did not formally assess  Concentration: Did not formally assess but grossly intact  Recall: Grossly intact but did not formally assess  Attention grossly intact but not formally assessed  Eye Contact: Good  Speech: Fair, dysarthric  Language: Fair  Volume: Normal  Mood: Tired  Affect: Depressed, flat, anxious  Thought Process: Coherent linear  Thought Content: Logical.  Morbid ruminations.  Worried about current situation and future.  Suicidal Thoughts:  No  Homicidal Thoughts:  No  Judgement:  Fair  Insight:  Fair  Psychomotor Activity:  Increased, dyskinetic movements of mouth. Some dyskinetic movements on finger taps.  With mouth movement is sliding jaw  back and forth. There is some voluntary inhibition but is overall involuntary when he is not overriding. Movement goes away in sleep.   Akathisia:  Yes  Fund of Knowledge:  Fair      Assets:  Desire for Improvement  Cognition: Did not formally assess  ADL's: Impaired by patient's movement limitations but is improving  AIMS (if indicated):  0     Other History   These have been pulled in through the EMR, reviewed, and updated if appropriate.  Family History:  The patient's family history is not on file.  Medical History: Past Medical History:  Diagnosis Date   Anxiety    Bipolar 1 disorder (HCC)    Depression    Hepatitis    Hypertension    MRSA (methicillin resistant Staphylococcus aureus)    5/25 arm abscess and 11/25    Surgical History: Past Surgical History:  Procedure Laterality Date   BURR HOLE N/A 08/01/2024   Procedure: CREATION, CRANIAL BURR HOLE WITH EVACUATION OF HEMATOMA;  Surgeon: Rosslyn Dino HERO, MD;  Location: [facility] OR;  Service: Neurosurgery;  Laterality: N/A;   BURR HOLE N/A 08/12/2024   Procedure: left bone flap  removal;  Surgeon: Rosslyn Dino HERO, MD;  Location: [facility] OR;  Service: Neurosurgery;  Laterality: N/A;   CRANIOPLASTY, WITH BONE FLAP REPLACEMENT Left 07/29/2024   Procedure: CRANIOPLASTY, WITH BONE FLAP REPLACEMENT;  Surgeon: Rosslyn Dino HERO, MD;  Location: [facility] OR;  Service: Neurosurgery;  Laterality: Left;  LEFT CRANIOPLASTY WITH ARTIFICAL BONE FLAP REPLACEMENT   CRANIOTOMY Left 06/30/2024   Procedure: CRANIOTOMY HEMATOMA EVACUATION SUBDURAL;  Surgeon: Melonie Grass, MD;  Location: [facility] ORS;  Service: Neurosurgery;  Laterality: Left;     Medications:   Current Facility-Administered Medications:    acetaminophen  (TYLENOL ) tablet 650 mg, 650 mg, Oral, Q4H PRN, 650 mg at 09/03/24 0750 **OR** [PENDING] acetaminophen  (TYLENOL ) tablet 650 mg, 650 mg, Per Tube, Q4H PRN **OR** acetaminophen  (TYLENOL ) suppository 650 mg, 650 mg, Rectal, Q4H PRN **OR** [PENDING] acetaminophen  (TYLENOL ) suppository 650 mg, 650 mg, Rectal, Q4H PRN,    Ampicillin-Sulbactam (UNASYN) 3 g in sodium chloride  0.9 % 100 mL IVPB, 3 g, Intravenous, Q6H, Hershal Vito MATSU, RPH, Last Rate: 200 mL/hr at 09/03/24 0601, 3 g at 09/03/24 0601   Chlorhexidine  Gluconate Cloth 2 % PADS 6 each, 6 each, Topical, Daily, Franky Redia SAILOR, MD, 6 each at 09/02/24 9076   clonazePAM  (KLONOPIN ) tablet 2 mg, 2 mg, Oral, TID, McCarty, Artie, MD, 2 mg at 09/03/24 0256   docusate (COLACE) 50 MG/5ML liquid 100 mg, 100 mg, Oral, Daily, Hongalgi, Anand D, MD, 100 mg at 09/02/24 9078   docusate sodium  (COLACE) capsule 100 mg, 100 mg, Oral, BID PRN, Autry, Lauren E, PA-C, 100 mg at 08/25/24 0847   feeding supplement (ENSURE PLUS HIGH PROTEIN) liquid 237 mL, 237 mL, Oral, BID BM, Danford, Christopher P, MD, 237 mL at 09/02/24 0926   ferrous sulfate tablet 325 mg, 325 mg, Oral, Q breakfast, Regalado, Belkys A, MD, 325 mg at 09/03/24 0755   folic acid  (FOLVITE ) tablet 1 mg, 1 mg, Oral, Daily, Ogbata, Sylvester I, MD, 1 mg at 09/03/24 9085   gabapentin   (NEURONTIN ) capsule 200 mg, 200 mg, Oral, TID, Danford, Lonni SQUIBB, MD, 200 mg at 09/03/24 0914   guaiFENesin -dextromethorphan (ROBITUSSIN DM) 100-10 MG/5ML syrup 5 mL, 5 mL, Oral, Q4H PRN, Pudota, Ellouise SQUIBB, MD   heparin  injection 5,000 Units, 5,000 Units, Subcutaneous, Q8H, Desai, Rahul P, PA-C, 5,000 Units at 09/03/24 0559  labetalol  (NORMODYNE ) injection 10-40 mg, 10-40 mg, Intravenous, Q10 min PRN, Janjua, Rashid M, MD   lactated ringers  infusion, , Intravenous, Continuous, Regalado, Belkys A, MD, Last Rate: 75 mL/hr at 09/02/24 2124, New Bag at 09/02/24 2124   LORazepam  (ATIVAN ) injection 0.5 mg, 0.5 mg, Intravenous, Q6H PRN, Krishnan, Gokul, MD, 0.5 mg at 09/03/24 9942   magic mouthwash w/lidocaine , 5 mL, Oral, TID PRN, Regalado, Belkys A, MD, 5 mL at 09/03/24 0750   melatonin tablet 10 mg, 10 mg, Oral, QHS PRN, Shalhoub, George J, MD, 10 mg at 09/03/24 0057   mirtazapine  (REMERON ) tablet 15 mg, 15 mg, Oral, QHS, McCarty, Artie, MD, 15 mg at 09/03/24 0256   multivitamin with minerals tablet 1 tablet, 1 tablet, Oral, Daily, Rosario Eland I, MD, 1 tablet at 09/02/24 9078   nicotine  (NICODERM CQ  - dosed in mg/24 hours) patch 14 mg, 14 mg, Transdermal, Daily, Fredia Dorothe HERO, MD, 14 mg at 09/03/24 0915   nystatin  (MYCOSTATIN ) 100000 UNIT/ML suspension 500,000 Units, 5 mL, Oral, QID, Regalado, Belkys A, MD, 500,000 Units at 09/02/24 9077   ondansetron  (ZOFRAN ) tablet 4 mg, 4 mg, Oral, Q4H PRN **OR** ondansetron  (ZOFRAN ) injection 4 mg, 4 mg, Intravenous, Q4H PRN, Janjua, Rashid M, MD, 4 mg at 08/01/24 1506   Oral care mouth rinse, 15 mL, Mouth Rinse, PRN, Mannam, Praveen, MD   pantoprazole  (PROTONIX ) EC tablet 40 mg, 40 mg, Oral, QHS, Hongalgi, Anand D, MD, 40 mg at 09/01/24 2100   phenol (CHLORASEPTIC) mouth spray 1 spray, 1 spray, Mouth/Throat, PRN, Franky Redia SAILOR, MD, 1 spray at 09/03/24 0057   polyethylene glycol (MIRALAX  / GLYCOLAX ) packet 17 g, 17 g, Oral, Daily, Desai, Rahul  P, PA-C, 17 g at 09/02/24 9078   promethazine  (PHENERGAN ) tablet 12.5-25 mg, 12.5-25 mg, Oral, Q4H PRN, Janjua, Rashid M, MD   propranolol  (INDERAL ) tablet 20 mg, 20 mg, Oral, BID, Regalado, Belkys A, MD, 20 mg at 09/02/24 9078   senna (SENOKOT) tablet 17.2 mg, 2 tablet, Oral, Daily, Desai, Rahul P, PA-C, 17.2 mg at 09/02/24 9078   sodium chloride  flush (NS) 0.9 % injection 10-40 mL, 10-40 mL, Intracatheter, Q12H, Franky Redia SAILOR, MD, 10 mL at 09/03/24 0916   sodium chloride  flush (NS) 0.9 % injection 10-40 mL, 10-40 mL, Intracatheter, PRN, Franky Redia SAILOR, MD, 30 mL at 07/29/24 1135   thiamine  (VITAMIN B1) tablet 100 mg, 100 mg, Oral, Daily, Hongalgi, Anand D, MD, 100 mg at 09/03/24 9083   valbenazine  (INGREZZA ) capsule 80 mg, 80 mg, Oral, Daily, McCarty, Artie, MD, 80 mg at 09/03/24 0916   vancomycin  (VANCOREADY) IVPB 750 mg/150 mL, 750 mg, Intravenous, Q12H, Regalado, Belkys A, MD, Last Rate: 150 mL/hr at 09/02/24 2159, 750 mg at 09/02/24 2159  Allergies: No Known Allergies  Angellina Ferdinand, MD

## 2024-09-03 NOTE — Progress Notes (Signed)
 SLP Cancellation Note  Patient Details Name: TRISTIN VANDEUSEN MRN: [id number] DOB: 1965/11/08   Cancelled treatment:       Reason Eval/Treat Not Completed: Patient's level of consciousness Patient asleep in room and per sitter, he had just fallen asleep. She added that when he is awake he has been getting agitated and doesn't listen. SLP will continue efforts.  Norleen IVAR Blase, MA, CCC-SLP Speech Therapy  09/03/2024, 9:55 AM

## 2024-09-03 NOTE — Plan of Care (Signed)
  Problem: Education: Goal: Knowledge of General Education information will improve Description: Including pain rating scale, medication(s)/side effects and non-pharmacologic comfort measures Outcome: Progressing   Problem: Health Behavior/Discharge Planning: Goal: Ability to manage health-related needs will improve Outcome: Progressing   Problem: Clinical Measurements: Goal: Ability to maintain clinical measurements within normal limits will improve Outcome: Progressing Goal: Will remain free from infection Outcome: Progressing Goal: Diagnostic test results will improve Outcome: Progressing Goal: Respiratory complications will improve Outcome: Progressing Goal: Cardiovascular complication will be avoided Outcome: Progressing   Problem: Activity: Goal: Risk for activity intolerance will decrease Outcome: Progressing   Problem: Nutrition: Goal: Adequate nutrition will be maintained Outcome: Progressing   Problem: Coping: Goal: Level of anxiety will decrease Outcome: Progressing   Problem: Elimination: Goal: Will not experience complications related to bowel motility Outcome: Progressing Goal: Will not experience complications related to urinary retention Outcome: Progressing   Problem: Pain Managment: Goal: General experience of comfort will improve and/or be controlled Outcome: Progressing   Problem: Safety: Goal: Ability to remain free from injury will improve Outcome: Progressing   Problem: Skin Integrity: Goal: Risk for impaired skin integrity will decrease Outcome: Progressing   Problem: Education: Goal: Knowledge of the prescribed therapeutic regimen will improve Outcome: Progressing   Problem: Clinical Measurements: Goal: Usual level of consciousness will be regained or maintained. Outcome: Progressing Goal: Neurologic status will improve Outcome: Progressing Goal: Ability to maintain intracranial pressure will improve Outcome: Progressing    Problem: Skin Integrity: Goal: Demonstration of wound healing without infection will improve Outcome: Progressing

## 2024-09-04 ENCOUNTER — Inpatient Hospital Stay (HOSPITAL_COMMUNITY): Payer: MEDICAID

## 2024-09-04 DIAGNOSIS — F310 Bipolar disorder, current episode hypomanic: Secondary | ICD-10-CM

## 2024-09-04 LAB — GLUCOSE, CAPILLARY
Glucose-Capillary: 111 mg/dL — ABNORMAL HIGH (ref 70–99)
Glucose-Capillary: 126 mg/dL — ABNORMAL HIGH (ref 70–99)
Glucose-Capillary: 96 mg/dL (ref 70–99)

## 2024-09-04 MED ORDER — FREE WATER
100.0000 mL | Status: DC
  Administered 2024-09-04 – 2024-09-09 (×30): 100 mL

## 2024-09-04 MED ORDER — OSMOLITE 1.5 CAL PO LIQD
1000.0000 mL | ORAL | Status: DC
  Administered 2024-09-04 – 2024-09-07 (×2): 1000 mL
  Filled 2024-09-04 (×10): qty 1000

## 2024-09-04 MED ORDER — PROSOURCE TF20 ENFIT COMPATIBL EN LIQD
60.0000 mL | Freq: Every day | ENTERAL | Status: DC
  Administered 2024-09-04 – 2024-09-09 (×6): 60 mL
  Filled 2024-09-04 (×6): qty 60

## 2024-09-04 NOTE — Progress Notes (Signed)
 Brief Nutrition Note  The patient failed repeat MBS with SLP today. D/W attending and Cortrak order was placed. SLP plans to allow patient a cup of applesauce, Magic Cup or pudding with nursing no more than 3x per day and patient must be sitting up in his recliner with full supervision for all PO.  Scheduled Meds  Chlorhexidine  Gluconate Cloth  6 each Topical Daily   clonazePAM   1 mg Oral TID   docusate  100 mg Oral Daily   feeding supplement  237 mL Oral BID BM   ferrous sulfate  325 mg Oral Q breakfast   folic acid   1 mg Oral Daily   gabapentin   200 mg Oral TID   heparin  injection (subcutaneous)  5,000 Units Subcutaneous Q8H   mirtazapine   15 mg Oral QHS   multivitamin with minerals  1 tablet Oral Daily   nicotine   14 mg Transdermal Daily   nystatin   5 mL Oral QID   pantoprazole   40 mg Oral QHS   polyethylene glycol  17 g Oral Daily   propranolol   20 mg Oral BID   senna  2 tablet Oral Daily   sodium chloride  flush  10-40 mL Intracatheter Q12H   thiamine   100 mg Oral Daily   valbenazine   80 mg Oral Daily   Continuous Infusions  ampicillin-sulbactam (UNASYN) IV 3 g (09/04/24 1115)   lactated ringers  75 mL/hr at 09/04/24 1044   vancomycin  750 mg (09/04/24 1045)   Labs    Latest Ref Rng & Units 09/03/2024    1:31 AM 09/02/2024    3:47 AM 08/30/2024    6:02 AM  CMP  Glucose 70 - 99 mg/dL 96  95  96   BUN 6 - 20 mg/dL 23  24  20    Creatinine 0.61 - 1.24 mg/dL 8.91  8.99  8.92   Sodium 135 - 145 mmol/L 140  139  144   Potassium 3.5 - 5.1 mmol/L 3.8  3.8  4.3   Chloride 98 - 111 mmol/L 106  106  107   CO2 22 - 32 mmol/L 25  29  26    Calcium 8.9 - 10.3 mg/dL 9.1  9.4  9.6    I/O  -9.8 L since admit  Edema: non-pitting facial  Last BM:  11/29   Estimated Nutritional Needs:  Kcal:  2100-2400 Protein:  110-130 Fluid:  2100-2400  INTERVENTION:  When Cortrak is ready for use, begin Osmolite 1.5 at 20 mL/hr. Increase by 10 mL/hr every 4 hours as tolerated to 60 mL/hr  goal. 1 packet ProSource TF20 daily. 100 mL water flushes every 4 hours. This enteral nutrition regimen provides 1440 mL formula, 2240 Kcals, 110 g protein, 293 g carbohydrates and 1697 mL free water daily, which meets 100% of the patient's estimated nutrition needs. OK to discontinue IVF when TF starts. Bowel care per protocol - last BM charted on 11/29. Please review full assessment note from 09/03/2024 for greater details.    Leverne Ruth, MS, RDN, LDN [place]. [facility] See AMION for contact information Epic/secure chat preferred

## 2024-09-04 NOTE — Progress Notes (Signed)
 Tube feeds started at 18:00 after x ray placement confirmation and verification from dietitian and MD. Small bore feed tube flushed as per orders, tube feed rate at 60 mL/hr with free water flushes Q 4 hr.

## 2024-09-04 NOTE — Procedures (Signed)
 Cortrak  Person Inserting Tube:  Mady Dolly, RD Tube Type:  Cortrak - 43 inches Tube Size:  10 Tube Location:  Left nare Secured by: Bridle Technique Used to Measure Tube Placement:  Marking at nare/corner of mouth Cortrak Secured At:  90 cm   Cortrak Tube Team Note:  Consult received to place a Cortrak feeding tube.   X-ray is required. RN may begin using tube after appropriate positioning confirmed.   If the tube becomes dislodged please keep the tube and contact the Cortrak team at [email or web address] for replacement.  If after hours and replacement cannot be delayed, place a NG tube and confirm placement with an abdominal x-ray.   Dolly Mady MS, RD, LDN Registered Dietitian Clinical Nutrition RD Inpatient Contact Info in Amion

## 2024-09-04 NOTE — Procedures (Signed)
 Modified Barium Swallow Study  Patient Details  Name: Ernest Romero MRN: [id number] Date of Birth: [birth year]  Today's Date: 09/04/2024  Modified Barium Swallow completed.  Full report located under Chart Review in the Imaging Section.  History of Present Illness Patient is a 58 y.o. male who was initially admitted to [facility] on 06/30/24 after an unresponsive episode in jail. He was found to have a large SDH. He underwent a left frontoparietal craniotomy with evacuation of SDH and drain placement 9/28 (drain removed 9/29). SLP was following patient at [facility] for cognitive-linguistic goals. He was on a regular solids, thin liquids diet prior to transfer to [facility] on 10/14 for further management of craniotomy and flap. On 10/27, he underwent cranioplasty with bone flap replacement. On 10/28 he had a change in cognitive status thought to be due to Klonopin  but repeat CTH showed significant increase in size of mixed attenuation subdural fluid collection underlying the left tempoparietal craniotomy site with associated mild mass effect and slight rightward midline shift. He underwent left burr hole for evacuation on 10/30. He underwent left bone flap removal on 11/10 after developing SDH s/p burr hole evacuation followed by swelling and suspected infection. 10/30 CXR without acute cardiopulmonary abnormality. SLP evaluated patient for swallow function at bedside on 10/28 recommending NPO due to change in mentation but diet initiated on 10/31 following PO trials with patient's alertness improving and a dysphagia 3 (mechanical soft) solids, thin liquids diet was recommended. RN requested SLP reassess patient's swallowing on 11/18 due to concerns of coughing when drinking liquids. Patient reassessed at bedside and continuation of Dys 3, thin liquids diet recommended with careful monitoring. SLP swallow evaluation reordered on 11/28 due to RN observing patient to cough when taking medications leading to MD changing patient  to NPO awaiting SLP reassessment. MBS completed on 11/29 which reported severe oropharyngeal dysphagia with aspiration of all liquids and puree solids. SLP recommended NPO status awaiting repeat chest imaging. MD and SLP in agreement to initiate PO diet of full liquids/thin consistency after repeat chest imaging indicating patient's lungs were clear. Patient reportedly continued to struggle with PO diet and decision was made for NPO status but allowing ice chips and water sips PRN. Repeat CTH on 12/1 reported: Evolving postoperative changes from left sided hemicraniectomy and bone flap removal without evidence of an acute intracranial abnormality or significant residual subdural collection. MD reduced patient's Clonazepam  dose in hopes of improving his alertness.  DIGEST Swallow Severity Rating*  Safety: 4  Efficiency: 3  Overall Pharyngeal Swallow Severity: 4 1: mild; 2: moderate; 3: severe; 4: profound  *The Dynamic Imaging Grade of Swallowing Toxicity is standardized for the head and neck cancer population, however, demonstrates promising clinical applications across populations to standardize the clinical rating of pharyngeal swallow safety and severity.  Clinical Impression Recs: NPO except necessary meds crushed in puree, Cortrak for nutrition, therapeutic trials of honey thick liquids and puree solids with SLP  As etiology of this acute change in his swallow function is unknown, we will need to revisit next week to determine if there has been any functional change in his swallow or if longer term artificial means of nutrition would be needed. In addition, further conversations may need to be had with patient and/or POA regarding GOC.  Patient's swallow function appears similar to MBS completed on 08/31/24 and he continues with silent aspiration of gross amounts. Chin tuck posture was beneficial with honey thick liquids, resulting in full epiglottic inversion and complete laryngeal vestibule  closure as well as clearance of majority of bolus and no observed increase in vallecular or pyriform sinus residuals which were present from previous swallows. Factors that may increase risk of adverse event in presence of aspiration Noe & Lianne 2021): Aspiration of thick, dense, and/or acidic materials;Frequent aspiration of large volumes;Reduced cognitive function;Frail or deconditioned  Swallow Evaluation Recommendations Recommendations: NPO except meds Medication Administration: Crushed with puree Supervision: Full supervision/cueing for swallowing strategies Swallowing strategies  : Slow rate;Small bites/sips Postural changes: Position pt fully upright for meals Oral care recommendations: Oral care BID (2x/day)     Norleen IVAR Blase, MA, CCC-SLP Speech Therapy  09/04/2024,5:15 PM

## 2024-09-04 NOTE — Progress Notes (Signed)
 Procedure: Suture Removal  Removed: Surgical Closed Surgical Incision Head Left;Upper Surgical Closed Surgical Incision Head Lateral;Left  Details:  The incision was inspected prior to suture removal and found to be clean, dry, and intact with well-approximated wound edges. No erythema, drainage, fluctuance, or signs of infection were noted.   All sutures were removed without complication. The patient tolerated the procedure well with no immediate concerns.   The patient was educated on wound care, signs of infection (redness, warmth, drainage, fever), and wound car instructions.   EVD sutures remain intact. No sign of drainage or erythema. DO NOT remove at this time.

## 2024-09-04 NOTE — Progress Notes (Signed)
 PT Cancellation Note  Patient Details Name: Ernest Romero MRN: [id number] DOB: December 27, 1965   Cancelled Treatment:    Reason Eval/Treat Not Completed: (P) Patient at procedure or test/unavailable (Pt getting cortrak placed. Will follow up as able.)   Darryle George 09/04/2024, 3:18 PM

## 2024-09-04 NOTE — Progress Notes (Signed)
 [facility]                                                                              Ernest Romero, is a 58 y.o. male, DOB - Nov 18, 1965, FMW:[id number] Admit date - 07/15/2024    Outpatient Primary MD for the [facility] is [name]  LOS - 51  days  No chief complaint on file.      Brief summary   58 year old homeless, medical history significant for alcohol use disorder, bipolar and panic disorder, initially [facility] jail in September 2025 with a large subdural hematoma.  Underwent craniotomy with flap replacement on 10/27.  Since then multiple OR visits including 10/30 for SDH evacuation, follow-up CT head 11/8 showed fluid collection underneath the bone flap, back to the OR 11/10 and then bone flap was removed, cultures were obtained intraoperative growing MRSA, treating for infected bone flap, ID following and currently on IV vancomycin .   10/27 crani w/ flap replacement 10/29: off clevi, liberate SBP goal, Klonopin , lithium , Haldol  as needed discontinued.  Latuda  dose cut by half 10/30: repeat CT head with increased collection - going to OR this afternoon for evacuation 10/31: OR  for SDH evacuation.  Developed arm twitching.  Klonopin  restarted at half dose 11/1: More awake.  No complaint 11/2 transferred out of ICU. 11/4 cortrak removed 11/8 CT head showed fluid collection under bone flap, decision made to remove bone flap 11/10 to OR for L bone flap removal 11/11 repeat CT head stable 11/29: notice to have worsening Dysphagia.  12/1 repeat CT head: stable.    Assessment & Plan    Subdural hematoma Status post initial craniotomy 9/28 (Dr. 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 - Followed by neurosurgery. -Evaluated by infectious disease who recommends 6 weeks of IV vancomycin  and day 09/23/2024.  After completion  of IV antibiotics he will need 8 weeks of doxycycline . - Will need PICC line placed closer to discharge date. - Completed 1 week of Keppra  - Sutures should come out December 1. [name] will remove suture this week.  - Awaiting placement/SNF     -Acute metabolic encephalopathy - At baseline, patient reportedly without cognitive impairment, was homeless and independent caring for self. - At some point, during this hospitalization patient was deemed not to have capacity, due to encephalopathy and neurological deficits. - Per recent physicians notes patient appears to be able to make decision for himself moving forward. -Continue to monitor, capacity could fluctuates.  -12/01: Patient more lethargic, CT head repeated evolving post operative finding, no acute intracranial abnormalities , Inflammatory marker trending down CRP 1.3.  - Klonopin  reduced to 1 mg 3 times daily on 12/2   Bipolar disorder/akathisia/drug-induced parkinsonism: - Patient with long standing history of bipolar disorder. - Patient was on Haldol  and Latuda , developed facial tremors 11/20 concern for tardive dyskinesia. - EEG negative for seizures. - Latuda  and Haldol  discontinued. - Patient was started on propranolol  and Klonopin  -On Ingrezza . Klonopin   -Psych started patient on Valbezine, if  not helping by 09/04/2024 psych will consider to discontinue it.  - Klonopin  reduce to 1 mg  TID on 12/2 due to oversedation.  May need to reduce further.  Psychiatry following and adjusting meds.   Dysphagia 11/29: Overnight after medications given with water patient felt something got stuck, he require suctioning.  -evaluated by speech. MBS: sign of aspiration.  Speech following.  -IV fluids.  - CT head. Stable. Suspect component of oversedation due to klonopin .  -Follow SLP evaluation, may need core track if unable to start diet   Aspiration PNA;  Acute hypoxic Resp failure.  -In setting of worsening dysphagia, has cough.   -Incentive spirometry.  Continue IV Unasyn -Chest x ray showed BL increasing atelectasis.  - ABG O2 70, continue O2 via [place].    history of alcohol, tobacco use disorder: - Continue thiamine , folic acid  and multivitamin Treated for alcohol withdrawal delirium with phenobarbital  taper at [facility]. Resolved   Essential hypertension: - BP stable  Amlodipine  and losartan  has been discontinued. Propranolol  dose decreased to avoid hypotension   Constipation: Continue laxatives.    Bradycardia; holder parameter for Propanolol    Severe malnutrition: Continue ensure.    Iron deficiency anemia; started  iron supplement.    Oral Thrush; started nystatin    Protein calorie malnutrition Nutrition Problem: Severe Malnutrition Etiology: acute illness Signs/Symptoms: energy intake < or equal to 50% for > or equal to 5 days, severe muscle depletion, moderate fat depletion Interventions: Tube feeding  Estimated body mass index is 21.13 kg/m as calculated from the following:   Height as of this encounter: 5' 10 (1.778 m).   Weight as of this encounter: 66.8 kg.  Code Status: Full code DVT Prophylaxis:  heparin  injection 5,000 Units Start: 08/13/24 1400 SCDs Start: 08/12/24 1150 Place TED hose Start: 08/12/24 1150 SCDs Start: 08/01/24 1745 Place and maintain sequential compression device Start: 07/29/24 1819   Level of Care: Level of care: Med-Surg Family Communication:  Disposition Plan:      Remains inpatient appropriate: Awaiting placement   Procedures:    Consultants:   Psych ID CCM Neurosurgery   Antimicrobials:   Anti-infectives (From admission, onward)    Start     Dose/Rate Route Frequency Ordered Stop   09/02/24 1700  Ampicillin-Sulbactam (UNASYN) 3 g in sodium chloride  0.9 % 100 mL IVPB        3 g 200 mL/hr over 30 Minutes Intravenous Every 6 hours 09/02/24 1611 09/07/24 1659   08/31/24 1000  vancomycin  (VANCOREADY) IVPB 750 mg/150 mL        750 mg 150 mL/hr  over 60 Minutes Intravenous Every 12 hours 08/31/24 0015     08/21/24 0800  vancomycin  (VANCOCIN ) IVPB 1000 mg/200 mL premix  Status:  Discontinued        1,000 mg 200 mL/hr over 60 Minutes Intravenous Every 12 hours 08/21/24 0720 08/31/24 0015   08/19/24 0600  vancomycin  (VANCOREADY) IVPB 1250 mg/250 mL  Status:  Discontinued        1,250 mg 166.7 mL/hr over 90 Minutes Intravenous Every 12 hours 08/18/24 1818 08/21/24 0547   08/18/24 1830  vancomycin  (VANCOREADY) IVPB 500 mg/100 mL        500 mg 100 mL/hr over 60 Minutes Intravenous  Once 08/18/24 1817 08/19/24 1642   08/16/24 1600  vancomycin  (VANCOREADY) IVPB 750 mg/150 mL  Status:  Discontinued        750 mg 150 mL/hr over 60 Minutes Intravenous Every 8 hours 08/16/24 0828 08/18/24  1818   08/14/24 0800  vancomycin  (VANCOCIN ) IVPB 1000 mg/200 mL premix  Status:  Discontinued        1,000 mg 200 mL/hr over 60 Minutes Intravenous Every 8 hours 08/14/24 0016 08/16/24 0828   08/14/24 0030  vancomycin  (VANCOCIN ) IVPB 1000 mg/200 mL premix        1,000 mg 200 mL/hr over 60 Minutes Intravenous NOW 08/14/24 0016 08/14/24 0135   08/13/24 0100  vancomycin  (VANCOREADY) IVPB 750 mg/150 mL  Status:  Discontinued        750 mg 150 mL/hr over 60 Minutes Intravenous Every 12 hours 08/12/24 1230 08/14/24 0007   08/12/24 1400  ceFAZolin  (ANCEF ) IVPB 2g/100 mL premix  Status:  Discontinued        2 g 200 mL/hr over 30 Minutes Intravenous Every 8 hours 08/12/24 1149 08/12/24 1230   08/12/24 1400  cefTAZidime  (FORTAZ ) 2 g in sodium chloride  0.9 % 100 mL IVPB  Status:  Discontinued        2 g 200 mL/hr over 30 Minutes Intravenous Every 8 hours 08/12/24 1152 08/13/24 1109   08/12/24 1245  ceFAZolin  (ANCEF ) IVPB 2g/100 mL premix  Status:  Discontinued        2 g 200 mL/hr over 30 Minutes Intravenous  Once 08/12/24 1149 08/12/24 1449   08/12/24 1245  vancomycin  (VANCOREADY) IVPB 1250 mg/250 mL        1,250 mg 166.7 mL/hr over 90 Minutes Intravenous  Once  08/12/24 1152 08/12/24 1501   08/12/24 0957  vancomycin  (VANCOCIN ) powder  Status:  Discontinued          As needed 08/12/24 1008 08/12/24 1037   08/01/24 2200  ceFAZolin  (ANCEF ) IVPB 2g/100 mL premix        2 g 200 mL/hr over 30 Minutes Intravenous Every 8 hours 08/01/24 1703 08/02/24 1915   08/01/24 1530  vancomycin  (VANCOCIN ) powder  Status:  Discontinued          As needed 08/01/24 1559 08/01/24 1606   08/01/24 1421  ceFAZolin  (ANCEF ) 2-4 GM/100ML-% IVPB       Note to Pharmacy: Roslynn Birmingham: cabinet override      08/01/24 1421 08/02/24 0229   07/29/24 2200  ceFAZolin  (ANCEF ) IVPB 2g/100 mL premix        2 g 200 mL/hr over 30 Minutes Intravenous Every 8 hours 07/29/24 1859 07/30/24 1423   07/29/24 1654  vancomycin  (VANCOCIN ) powder  Status:  Discontinued          As needed 07/29/24 1655 07/29/24 1758   07/29/24 1515  ceFAZolin  (ANCEF ) IVPB 2g/100 mL premix        2 g 200 mL/hr over 30 Minutes Intravenous  Once 07/29/24 1421 07/29/24 1655          Medications  Chlorhexidine  Gluconate Cloth  6 each Topical Daily   clonazePAM   1 mg Oral TID   docusate  100 mg Oral Daily   feeding supplement  237 mL Oral BID BM   ferrous sulfate   325 mg Oral Q breakfast   folic acid   1 mg Oral Daily   gabapentin   200 mg Oral TID   heparin  injection (subcutaneous)  5,000 Units Subcutaneous Q8H   mirtazapine   15 mg Oral QHS   multivitamin with minerals  1 tablet Oral Daily   nicotine   14 mg Transdermal Daily   nystatin   5 mL Oral QID   pantoprazole   40 mg Oral QHS   polyethylene glycol  17 g Oral  Daily   propranolol   20 mg Oral BID   senna  2 tablet Oral Daily   sodium chloride  flush  10-40 mL Intracatheter Q12H   thiamine   100 mg Oral Daily   valbenazine   80 mg Oral Daily      Subjective:   Ernest Romero was seen and examined today.  Sleepy.  Sitter at the bedside reported that he was alert and awake and fell asleep 30 minutes before.  No acute issues overnight. Objective:   Vitals:    09/03/24 1810 09/03/24 2106 09/04/24 0018 09/04/24 0950  BP: 124/84 (!) 129/100 121/77 (!) 174/95  Pulse: (!) 54 61 (!) 44 (!) 51  Resp: 18 20    Temp: 98.2 F (36.8 C) 98 F (36.7 C) 97.8 F (36.6 C) 97.8 F (36.6 C)  TempSrc: Oral Oral  Oral  SpO2: 99% 100% 100% 98%  Weight:      Height:        Intake/Output Summary (Last 24 hours) at 09/04/2024 1257 Last data filed at 09/04/2024 0850 Gross per 24 hour  Intake 0 ml  Output 600 ml  Net -600 ml     Wt Readings from Last 3 Encounters:  09/02/24 66.8 kg  07/15/24 67.5 kg  06/16/24 78.9 kg     Exam General: Sleepy, NAD Cardiovascular: S1 S2 auscultated,  RRR Respiratory: Clear to auscultation bilaterally, no wheezing Gastrointestinal: Soft, nontender, nondistended, + bowel sounds Ext: no pedal edema bilaterally Neuro: Sleepy    Data Reviewed:  I have personally reviewed following labs    CBC Lab Results  Component Value Date   WBC 3.9 (L) 09/03/2024   RBC 3.99 (L) 09/03/2024   HGB 11.7 (L) 09/03/2024   HCT 35.7 (L) 09/03/2024   MCV 89.5 09/03/2024   MCH 29.3 09/03/2024   PLT 239 09/03/2024   MCHC 32.8 09/03/2024   RDW 12.7 09/03/2024   LYMPHSABS 2.3 07/16/2024   MONOABS 0.7 07/16/2024   EOSABS 0.2 07/16/2024   BASOSABS 0.1 07/16/2024     Last metabolic panel Lab Results  Component Value Date   NA 140 09/03/2024   K 3.8 09/03/2024   CL 106 09/03/2024   CO2 25 09/03/2024   BUN 23 (H) 09/03/2024   CREATININE 1.08 09/03/2024   GLUCOSE 96 09/03/2024   GFRNONAA >60 09/03/2024   GFRAA >60 09/16/2018   CALCIUM 9.1 09/03/2024   PHOS 3.9 08/26/2024   PROT 6.1 (L) 08/28/2024   ALBUMIN  3.1 (L) 08/28/2024   BILITOT 0.7 08/28/2024   ALKPHOS 69 08/28/2024   AST 26 08/28/2024   ALT 40 08/28/2024   ANIONGAP 9 09/03/2024    CBG (last 3)  Recent Labs    09/03/24 1116 09/03/24 1813 09/04/24 1144  GLUCAP 89 79 111*      Coagulation Profile: No results for input(s): INR, PROTIME in the last  168 hours.   Radiology Studies: I have personally reviewed the imaging studies  CT HEAD WO CONTRAST ( ) Result Date: 09/02/2024 EXAM: CT HEAD WITHOUT 09/02/2024 05:48:09 PM TECHNIQUE: CT of the head was performed without the administration of intravenous contrast. Automated exposure control, iterative reconstruction, and/or weight based adjustment of the mA/kV was utilized to reduce the radiation dose to as low as reasonably achievable. COMPARISON: Head CT 08/13/2024. CLINICAL HISTORY: Altered mental status, nontraumatic (Ped 0-17y); Subdural hematoma. FINDINGS: BRAIN AND VENTRICLES: Sequelae of left sided hemicraniectomy are again identified. Drains on the prior CT have been removed. Pneumocephalus has resolved. No sizable residual subdural collection is  evident. A small amount of low density extradural fluid and soft tissue thickening along the craniectomy have decreased. There is chronic dural thickening. No definite acute intracranial hemorrhage, acute infarct, or hydrocephalus is evident. There is no significant midline shift. ORBITS: No acute abnormality. SINUSES AND MASTOIDS: Persistent bilateral mastoid and left middle ear fluid, decreased from prior. Mild mucosal thickening in the included portion of the left maxillary sinus. SOFT TISSUES AND SKULL: No acute skull fracture. No acute soft tissue abnormality. IMPRESSION: 1. Evolving postoperative changes from left sided hemicraniectomy and bone flap removal without evidence of an acute intracranial abnormality or significant residual subdural collection. Electronically signed by: Dasie Hamburg MD 09/02/2024 06:15 PM EST RP Workstation: [id number]   DG CHEST PORT 1 VIEW Result Date: 09/02/2024 CLINICAL DATA:  Cough. EXAM: PORTABLE CHEST 1 VIEW COMPARISON:  Radiograph 2 days ago 09/30/2024 FINDINGS: Low lung volumes. Stable heart size and mediastinal contours. Mild atelectasis at the lung bases, increasing. No confluent opacity. No pulmonary edema,  pleural effusion, or pneumothorax. Remote left rib fractures. IMPRESSION: Low lung volumes with increasing bibasilar atelectasis. Electronically Signed   By: Andrea Gasman M.D.   On: 09/02/2024 15:40       Madolyn Ackroyd M.D. [facility] 09/04/2024, 12:57 PM  Available via Epic secure chat 7am-7pm After 7 pm, please refer to night coverage provider listed on amion.

## 2024-09-04 NOTE — Plan of Care (Signed)
  Problem: Education: Goal: Knowledge of General Education information will improve Description: Including pain rating scale, medication(s)/side effects and non-pharmacologic comfort measures Outcome: Progressing   Problem: Health Behavior/Discharge Planning: Goal: Ability to manage health-related needs will improve Outcome: Progressing   Problem: Clinical Measurements: Goal: Ability to maintain clinical measurements within normal limits will improve Outcome: Progressing Goal: Will remain free from infection Outcome: Progressing Goal: Diagnostic test results will improve Outcome: Progressing Goal: Respiratory complications will improve Outcome: Progressing Goal: Cardiovascular complication will be avoided Outcome: Progressing   Problem: Activity: Goal: Risk for activity intolerance will decrease Outcome: Progressing   Problem: Nutrition: Goal: Adequate nutrition will be maintained Outcome: Progressing   Problem: Coping: Goal: Level of anxiety will decrease Outcome: Progressing   Problem: Elimination: Goal: Will not experience complications related to bowel motility Outcome: Progressing Goal: Will not experience complications related to urinary retention Outcome: Progressing   Problem: Pain Managment: Goal: General experience of comfort will improve and/or be controlled Outcome: Progressing   Problem: Safety: Goal: Ability to remain free from injury will improve Outcome: Progressing   Problem: Skin Integrity: Goal: Risk for impaired skin integrity will decrease Outcome: Progressing   Problem: Education: Goal: Knowledge of the prescribed therapeutic regimen will improve Outcome: Progressing   Problem: Clinical Measurements: Goal: Usual level of consciousness will be regained or maintained. Outcome: Progressing Goal: Neurologic status will improve Outcome: Progressing Goal: Ability to maintain intracranial pressure will improve Outcome: Progressing    Problem: Skin Integrity: Goal: Demonstration of wound healing without infection will improve Outcome: Progressing

## 2024-09-04 NOTE — Progress Notes (Signed)
 OT Cancellation Note  Patient Details Name: Ernest Romero MRN: [id number] DOB: March 07, 1966   Cancelled Treatment:    Reason Eval/Treat Not Completed: Other (comment) (Pt having Cortrak placed, will continue efforts.)   [name] M. [place], OTR/L [facility] [phone] Secure Chat Preferred  [name] 09/04/2024, 3:43 PM

## 2024-09-05 DIAGNOSIS — F332 Major depressive disorder, recurrent severe without psychotic features: Secondary | ICD-10-CM

## 2024-09-05 DIAGNOSIS — G252 Other specified forms of tremor: Secondary | ICD-10-CM

## 2024-09-05 DIAGNOSIS — Z967 Presence of other bone and tendon implants: Secondary | ICD-10-CM

## 2024-09-05 DIAGNOSIS — T847XXD Infection and inflammatory reaction due to other internal orthopedic prosthetic devices, implants and grafts, subsequent encounter: Secondary | ICD-10-CM

## 2024-09-05 DIAGNOSIS — G9341 Metabolic encephalopathy: Secondary | ICD-10-CM

## 2024-09-05 DIAGNOSIS — F444 Conversion disorder with motor symptom or deficit: Secondary | ICD-10-CM

## 2024-09-05 DIAGNOSIS — G2119 Other drug induced secondary parkinsonism: Secondary | ICD-10-CM

## 2024-09-05 LAB — GLUCOSE, CAPILLARY
Glucose-Capillary: 125 mg/dL — ABNORMAL HIGH (ref 70–99)
Glucose-Capillary: 129 mg/dL — ABNORMAL HIGH (ref 70–99)
Glucose-Capillary: 100 mg/dL — ABNORMAL HIGH (ref 70–99)
Glucose-Capillary: 129 mg/dL — ABNORMAL HIGH (ref 70–99)
Glucose-Capillary: 128 mg/dL — ABNORMAL HIGH (ref 70–99)

## 2024-09-05 LAB — BASIC METABOLIC PANEL WITH GFR
Anion gap: 11 (ref 5–15)
BUN: 18 mg/dL (ref 6–20)
CO2: 22 mmol/L (ref 22–32)
Calcium: 9.1 mg/dL (ref 8.9–10.3)
Chloride: 110 mmol/L (ref 98–111)
Creatinine, Ser: 1.03 mg/dL (ref 0.61–1.24)
GFR, Estimated: >60 mL/min (ref >60)
Glucose, Bld: 79 mg/dL (ref 70–99)
Potassium: 4.3 mmol/L (ref 3.5–5.1)
Sodium: 143 mmol/L (ref 135–145)

## 2024-09-05 MED ORDER — CLONAZEPAM 0.5 MG PO TABS
0.5000 mg | ORAL_TABLET | Freq: Two times a day (BID) | ORAL | Status: DC
  Administered 2024-09-06 – 2024-09-07 (×4): 0.5 mg via ORAL
  Filled 2024-09-05 (×4): qty 1

## 2024-09-05 MED ORDER — SODIUM CHLORIDE 0.9 % IV BOLUS
250.0000 mL | Freq: Once | INTRAVENOUS | Status: AC
  Administered 2024-09-05: 250 mL via INTRAVENOUS

## 2024-09-05 MED ORDER — VALBENAZINE TOSYLATE 40 MG PO CAPS
40.0000 mg | ORAL_CAPSULE | Freq: Every day | ORAL | Status: AC
  Administered 2024-09-06 – 2024-11-08 (×64): 40 mg via ORAL
  Filled 2024-09-05 (×57): qty 1

## 2024-09-05 NOTE — Progress Notes (Signed)
 Occupational Therapy Treatment Patient Details Name: Ernest Romero MRN: [id number] DOB: 06-Mar-1966 Today's Date: 09/05/2024   History of present illness Pt is a 58 y.o. male presenting 10/14 from [facility] where he was admitted 9/28 for [facility] episode in the jail. 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 On CIWA protocol. Transferred to [facility] for further management of craniotomy and flap. 10/27 s/p cranioplasty with bone flap replacement. 10/28 Change in status thought to be related to Klonopin  administration 10/28, repeat CTH shows significant increase in size of mixed attenuation subdural fluid collection underlying the L tempoparietal craniotomy site, now measuring approximately 15 mm in thickness, with associated mild mass effect and slight rightward midline shift. OR on 10/30 for SDH evacuation. [facility] 11/7 with fluid collected under bone flap. 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 PMH alcohol use disorder, psychiatric disorder, HTN   OT comments  Pt greeted in bed, lethargic but wakefulness improving with stim and lights on. Pt seen for OT goal update, as reflected in POC. Pt with limited motivation to participate today, requiring much encouragement. He engaged in brief UB therapeutic exercises and bed level LB dressing in preparation for EOB. Pt then refusing to sit EOB, stating I wish you'd come back another day. 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 Frequently re-oriented and left pt in bed with lights on and HOB elevated. Pt continues to perseverate on becoming agitated if his lip keeps twitching. Pt agreeable for OT to return at a later date for improved progression. Will continue to follow.      If plan is discharge home, recommend the following:  Supervision due to cognitive status;Direct supervision/assist for medications  management;Assistance with cooking/housework;Direct supervision/assist for financial management;Assist for transportation;Assistance with feeding;A little help with walking and/or transfers;Help with stairs or ramp for entrance;A little help with bathing/dressing/bathroom   Equipment Recommendations  BSC/3in1    Recommendations for Other Services      Precautions / Restrictions Precautions Precautions: Fall Recall of Precautions/Restrictions: Impaired Precaution/Restrictions Comments: SBP <160, helmet (pt to wear when OOB per RN) Restrictions Weight Bearing Restrictions Per Provider Order: No       Mobility Bed Mobility               General bed mobility comments: pt refused    Transfers                   General transfer comment: NT, pt refused OOB this date     Balance                                           ADL either performed or assessed with clinical judgement   ADL Overall ADL's : Needs assistance/impaired Eating/Feeding: NPO                   Lower Body Dressing: Maximal assistance;Bed level;Cueing for sequencing Lower Body Dressing Details (indicate cue type and reason): pt requiring max encouragement to participate in bed level LB dressing, dons LLE sock via figure four technique once initiated by OT                    Extremity/Trunk Assessment  Vision       Perception     Praxis     Communication Communication Communication: Impaired Factors Affecting Communication: Reduced clarity of speech   Cognition Arousal: Lethargic Behavior During Therapy: Flat affect               OT - Cognition Comments: pt reporting he feels depressed, poor attention and tangential thoughts; perseverative on my lip keeps on twitching and I wish you'd come back another day               [facility] Scales of Cognitive Functioning: Automatic, Appropriate: Minimal Assistance for Daily  Living Skills [VII] Following commands: Impaired Following commands impaired: Follows one step commands with increased time      Cueing   Cueing Techniques: Verbal cues  Exercises Exercises: General Upper Extremity General Exercises - Upper Extremity Shoulder Flexion: AROM, Both, 5 reps, Supine Elbow Flexion: AROM, Both, 5 reps, Supine    Shoulder Instructions       General Comments pt with flat affect, reporting feeling depressed, and not wanting to participate much with OT. Focused on cognitive re-orientation and facilitating for problem solving through his rehab/recovery process.    Pertinent Vitals/ Pain       Pain Assessment Pain Assessment: 0-10 Faces Pain Scale: Hurts a little bit Pain Location: L lip from involuntary vertical movements Pain Descriptors / Indicators: Discomfort Pain Intervention(s): Limited activity within patient's tolerance, Monitored during session  Home Living                                          Prior Functioning/Environment              Frequency  Min 2X/week        Progress Toward Goals  OT Goals(current goals can now be found in the care plan section)  Progress towards OT goals: Progressing toward goals (slow)  Acute Rehab OT Goals Patient Stated Goal: I wish you'd come another day Time For Goal Achievement: 09/19/24  Plan      Co-evaluation                 AM-PAC OT 6 Clicks Daily Activity     Outcome Measure   Help from another person eating meals?: A Little Help from another person taking care of personal grooming?: A Little Help from another person toileting, which includes using toliet, bedpan, or urinal?: A Little Help from another person bathing (including washing, rinsing, drying)?: A Little Help from another person to put on and taking off regular upper body clothing?: A Little Help from another person to put on and taking off regular lower body clothing?: A Little 6 Click Score:  18    End of Session    OT Visit Diagnosis: Other abnormalities of gait and mobility (R26.89);Muscle weakness (generalized) (M62.81);Other symptoms and signs involving the nervous system (R29.898);Cognitive communication deficit (R41.841);Other symptoms and signs involving cognitive function   Activity Tolerance Patient tolerated treatment well   Patient Left in bed;with call bell/phone within reach;with bed alarm set;with nursing/sitter in room (1:1 sitter in room)   Nurse Communication          Time: 8590-8569 OT Time Calculation (min): 21 min  Charges: OT General Charges $OT Visit: 1 Visit OT Treatments $Therapeutic Activity: 8-22 mins  Shikira Folino M. Skylene Deremer, OTR/L [facility] Acute Rehabilitation Services [phone] Secure Chat Preferred  Rily Nickey 09/05/2024, 2:49 PM

## 2024-09-05 NOTE — Progress Notes (Signed)
 Speech Language Pathology Treatment: Dysphagia  Patient Details Name: Ernest Romero MRN: [id number] DOB: 1966-08-14 Today's Date: 09/05/2024 Time: 1030-1059 SLP Time Calculation (min) (ACUTE ONLY): 29 min  Assessment / Plan / Recommendation Clinical Impression  PLAN: Continue NPO with cortrak; therapeutic PO trials (honey thick with chin tuck; puree) with SLP only. May have ice chips with staff after oral care. SLP will follow; long-term nutrition needs to be determined over the course of the next several days.   Impression: Mr. Daigler was less alert today than he was yesterday.  With assist, he was able to brush his teeth; oral suctioning/rinsing provided.  He accepted several bites of applesauce with adequate attention; near-constant verbal cues were needed to put forth an effortful swallow. There was occasional weak coughing after trials (<25%), potentially indicative of aspiration. Ceased trials as he became more fatigued; mouth was suctioned and he was reclined to 30 degrees so he could rest as requested.    HPI HPI: Patient is a 58 y.o. male who was initially admitted to [facility] on 06/30/24 after an unresponsive episode in jail. He was found to have a large SDH. He underwent a left frontoparietal craniotomy with evacuation of SDH and drain placement 9/28 (drain removed 9/29). SLP was following patient at [facility] for cognitive-linguistic goals. He was on a regular solids, thin liquids diet prior to transfer to [facility] on 10/14 for further management of craniotomy and flap. On 10/27, he underwent cranioplasty with bone flap replacement. On 10/28 he had a change in cognitive status thought to be due to Klonopin  but repeat CTH showed significant increase in size of mixed attenuation subdural fluid collection underlying the left tempoparietal craniotomy site with associated mild mass effect and slight rightward midline shift. He underwent left burr hole for evacuation on 10/30. He underwent left bone flap  removal on 11/10 after developing SDH s/p burr hole evacuation followed by swelling and suspected infection. 10/30 CXR without acute cardiopulmonary abnormality. SLP evaluated patient for swallow function at bedside on 10/28 recommending NPO due to change in mentation but diet initiated on 10/31 following PO trials with patient's alertness improving and a dysphagia 3 (mechanical soft) solids, thin liquids diet was recommended. RN requested SLP reassess patient's swallowing on 11/18 due to concerns of coughing when drinking liquids. Patient reassessed at bedside and continuation of Dys 3, thin liquids diet recommended with careful monitoring. SLP swallow evaluation reordered on 11/28 due to RN observing patient to cough when taking medications leading to MD changing patient to NPO awaiting SLP reassessment. MBS completed on 11/29 which reported severe oropharyngeal dysphagia with aspiration of all liquids and puree solids. SLP recommended NPO status awaiting repeat chest imaging. MD and SLP in agreement to initiate PO diet of full liquids/thin consistency after repeat chest imaging indicating patient's lungs were clear. Patient reportedly continued to struggle with PO diet and decision was made for NPO status but allowing ice chips and water sips PRN. Repeat CTH on 12/1 reported: Evolving postoperative changes from left sided hemicraniectomy and bone flap removal without evidence of an acute intracranial abnormality or significant residual subdural collection. MD reduced patient's Clonazepam  dose in hopes of improving his alertness.      SLP Plan  Continue with current plan of care        Swallow Evaluation Recommendations   Recommendations: NPO;Alternative means of nutrition - NG Tube Medication Administration: Via alternative means Oral care recommendations: Oral care QID (4x/day)     Recommendations  Oral care QID;Oral care prior to ice chip/H20   Frequent or  constant Supervision/Assistance Dysphagia, oropharyngeal phase (R13.12)     Continue with current plan of care   Bralin Garry L. Vona, MA CCC/SLP Clinical Specialist - Acute Care SLP Acute Rehabilitation Services Office number [phone]   [name]  09/05/2024, 1:59 PM

## 2024-09-05 NOTE — Consult Note (Signed)
 [facility] Psychiatric Consult Follow-up  Patient Name: .Ernest Romero  MRN: [id number]  DOB: 1966/03/05  Consult Order details:  Orders (From admission, onward)     Start     Ordered   08/22/24 0842  IP CONSULT TO PSYCHIATRY       Ordering Provider: Verdene Purchase, MD  Provider:  (Not yet assigned)  Question Answer Comment  Location [facility]   Reason for Consult? patient with h/o bipolar d/o. was on lithium . here with subdural hematoma and MRSA. improving. some facial trmors. Need help with medication management. he was on lithium , lurasidone . requiring haldol  prn.  thanks      08/22/24 0842   08/02/24 1712  IP CONSULT TO PSYCHIATRY       Ordering Provider: Adolph Tinnie FORBES, PA-C  Provider:  (Not yet assigned)  Question Answer Comment  Location [facility]   Reason for Consult? bipolar      08/02/24 1711   07/24/24 1656  IP CONSULT TO PSYCHIATRY       Ordering Provider: Patsy Lenis, MD  Provider:  (Not yet assigned)  Question Answer Comment  Location [facility]   Reason for Consult? needing formal eval for capacity; may need guardianship given no family/support for discharge      07/24/24 1656   07/16/24 1412  IP CONSULT TO PSYCHIATRY       Ordering Provider: Dino Antu, MD  Provider:  (Not yet assigned)  Question Answer Comment  Location [facility]   Reason for Consult? 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  MDD, recurrent, severe, without psychotic  features 3.  Stimulant use disorder, in controlled setting Rule out traumatic brain injury from chronic subdural hematomas and neurosurgical procedures and complications Rule out bipolar disorder but likely previous manic episodes were meth induced per chart review  Assessment  Ernest Romero is a 58 y.o. male admitted: Medicallyfor 07/15/2024 11:15 PM for recurrent subdural hematoma. He carries the psychiatric diagnoses of bipolar disorder and alcohol use disorder and has a past medical history of subdural hematoma status postcraniotomy.   Current differential includes drug-induced parkinsonism as most likely issue that is slowly responding to medications.  DIP is most consistent with the characteristics of tremor including the frequency of the tremor being slower and consistent, being present at rest, and resolving with patient focusing on stopping it or being distracted.  Similarly to this there is some concern patient might have rapid syndrome which should be treated similarly to DIP.  Due to concern for drug-induced parkinsonism continuing benztropine  1 mg twice daily.   He has some mild improvement over last several days per patient report. Patient reports worsening mood symptoms of depression (7 out of 10) and anxiety (9 out of 10) due to ongoing perioral movements.  Given concern for tardive dyskinesia titrating valbenazine , been on 80 mg x3 days with some improvement and will continue for now.  Regard would continue utilizing the benzodiazepines, propranolol , and mirtazapine . Will decrease from Klonipin 2 mg to 1 mg  TID due to oversedation, lethargy and difficulty with swallowing. Patient has had several scheduled doses held over the last 2 days and he tolerated 1 mg TID dosing better with less sedation effects.   09/05/2024: Unable to assess patient is obtunded  Diagnoses:  Active Hospital problems: Principal Problem:   Subdural hematoma (HCC) Active Problems:   Bipolar disorder (HCC)    Essential hypertension   Alcohol withdrawal (HCC)   Unable to make decisions about medical treatment due to impaired mental capacity   Protein-calorie malnutrition, severe   Acute metabolic encephalopathy   Infection of craniotomy plate    Plan   ## Psychiatric Medication Recommendations:  Continue mirtazapine  15 mg once daily at bedtime for akathisia, insomnia, mood; monitor for mania switching but has tolerated >1 wk including benefit for jaw movement at night and sleep Continue propranolol  as can be helpful for akithisia if present, can help anxiety, and primary team can titrate for HTN Reduce clonazepam  0.5 mg two times daily for possible akathisia Ativan  0.5 PRN could potentially be d/c soon after 2 doses Decrease valbenazine  40 mg tardive dyskinesia; Patient with marked drowsiness since increasing the dose on 11/30 Consider pregabalin for TBI / anxiety in future   ## Medical Decision Making Capacity: Not specifically addressed in this encounter  ## Further Work-up:  -- TSH 1 month ago 0.775 While pt on Qtc prolonging medications, please monitor & replete K+ to 4 and Mg2+ to 2 -- most recent EKG on 11/19 had QtC of 414 -- Pertinent labwork reviewed earlier this admission includes: GFR greater than 60, LFT relatively within normal limits mildly elevated ALT, hemoglobin 11-12  ## Disposition:--WIll continue to follow.   ## Behavioral / Environmental: -Delirium Precautions: Delirium Interventions for Nursing and Staff: - RN to open blinds every AM. - To Bedside: Glasses, hearing aide, and pt's own shoes. Make available to patients. when possible and encourage use. - Encourage po fluids when appropriate, keep fluids within reach. - OOB to chair with meals. - Passive ROM exercises to all extremities with AM & PM care. - RN to assess orientation to person, time and place QAM and PRN. - Recommend extended visitation hours with familiar family/friends as feasible. - Staff to minimize  disturbances at night. Turn off television when pt asleep or when not in use.    ## Safety and Observation Level:  - Based on my clinical evaluation, I estimate the patient to be at low risk of self harm in the current setting. - At this time, we recommend  routine. This decision is based on my review of the chart including patient's history and current presentation, interview of the patient, mental status examination, and consideration of suicide risk including evaluating suicidal ideation, plan, intent, suicidal or self-harm behaviors, risk factors, and protective factors. This judgment is based on our ability to directly address suicide risk, implement suicide prevention strategies, and develop a safety plan while the patient is in the clinical setting. Please contact our team if there is a concern that risk level has changed.  CSSR Risk Category:C-SSRS RISK CATEGORY: No Risk  Suicide Risk Assessment: Patient has following modifiable risk factors for suicide: untreated depression, which we are addressing by medication adjustments. Patient has following non-modifiable or demographic risk factors for suicide: Male and psychiatric hospitalization Patient has the following protective factors against suicide: n/a  Thank you for this consult request. Recommendations have been communicated to the primary team.  We will continue to follow at this time.  Majel GORMAN Ramp, FNP       History of Present Illness  Relevant Aspects of [facility] Course per hospitalist:  PCCM Xfer 2/38. 58 year old male, homeless, medical history significant for alcohol use disorder, bipolar and panic disorder, [facility] jail [facility] September 2025 with a large subdural hematoma.  Underwent craniotomy with flap replacement on 10/27.  Since then multiple OR visits including 10/30 for [facility] evacuation, follow-up CT head 11/8 showed fluid collection underneath the bone flap, back to OR on 11/10  and the bone flap was removed, cultures were obtained, intraoperative cultures grew MRSA, treating for infected bone flap, ID on board and currently on IV vancomycin . Refer to PCCM progress note from 11/11 for detailed tabulation of hospital events until then.  Patient Report:  Unable to assess.    Psych ROS on initial assesment:  Depression: Reports he has been having depressed mood, anhedonia, worthlessness, at lowest in his life, but denies suicidal thoughts but does have morbid rumination Anxiety: Reports that he is an anxious person but that the anxiety has been much worse since being the hospital.  Says that he feels restless and is having issues sleeping because of it.  Reports that he is anxious about his situation where he will go from here. Mania (lifetime and current): Endorses having manic episodes but when asking about these episodes he does not describe any increased goal-directed activity, denies any decreased need for sleep, but does endorse feeling better, talking more, and impulsivity.  Denies having any dysfunction during these times.  Denies having any distractibility, grandiosity, flight of ideas, risky behaviors. Psychosis: (lifetime and current): Denies AVH or paranoia. Sleep: Reports that he cannot sleep with the shaking and in general   Psychiatric and Social History  Psychiatric History:  Information collected from patient and chart review  Prev Dx/Sx: Bipolar disorder, methamphetamine use, substance-induced mood disorder, alcohol use disorder Current Psych Provider: None Home Meds (current): Restarted on Latuda  40 and titrated to 80 this hospitalization, was not taking anything prior to admission in the short period that  He was away from hospital.  Also restarted on Klonopin  Previous Med Trials: Lithium , Klonopin , Latuda , Haldol  as needed for agitation Therapy: None  Prior Psych Hospitalization: Previous hospitalizations although the psychiatric hospitalizations  are not documented but the emergency department visits prior to have been documented and include episodes documented as mania but are occurring in the presence of positive amphetamine Prior Self Harm: Denies Prior Violence: Denies  Family Psych History: Denies Family Hx suicide: Denies  Social History:  Reports that he has been experiencing homelessness.  Does not report any recent incarceration which appears to conflict with previous chart review.  Does report he has a upcoming court date on December 7.  Denies access to weapons.  Reports that his hobby is making rock music.  Reports that he has a friend who is his international aid/development worker. Access to weapons/lethal means: Denies  Substance History Denies using any substances recently.  Has been in controlled setting.  Does have history of using stimulants but he denies when asked during interview.  Exam Findings  Physical Exam:  Vital Signs:  Temp:  [97.5 F (36.4 C)-98.6 F (37 C)] 97.6 F (36.4 C) (12/04 1107) Pulse Rate:  [45-65] 50 (12/04 1107) Resp:  [16-19] 19 (12/04 1107) BP: (87-141)/(64-95) 111/82 (12/04 1107) SpO2:  [93 %-97 %] 95 % (12/04 1107) Blood pressure 111/82, pulse (!) 50, temperature 97.6 F (36.4 C), temperature source Oral, resp. rate  19, height 5' 10 (1.778 m), weight 66.8 kg, SpO2 95%. Body mass index is 21.13 kg/m.  Physical Exam Vitals and nursing note reviewed.  Constitutional:      General: He is sleeping.     Interventions: He is restrained.     Comments: Chronic ill appearing  Pulmonary:     Effort: Pulmonary effort is normal.  Neurological:     Mental Status: He is lethargic.     Comments: No pain or stiffness with passive ROM of bilateral upper extremities.       Mental Status Exam: Unable to assess, patient obtunded     Other History   These have been pulled in through the EMR, reviewed, and updated if appropriate.  Family History:  The patient's family history is not on file.  Medical  History: Past Medical History:  Diagnosis Date   Anxiety    Bipolar 1 disorder (HCC)    Depression    Hepatitis    Hypertension    MRSA (methicillin resistant Staphylococcus aureus)    5/25 arm abscess and 11/25    Surgical History: Past Surgical History:  Procedure Laterality Date   BURR HOLE N/A 08/01/2024   Procedure: CREATION, CRANIAL BURR HOLE WITH EVACUATION OF HEMATOMA;  Surgeon: Rosslyn Dino HERO, MD;  Location: [facility] OR;  Service: Neurosurgery;  Laterality: N/A;   BURR HOLE N/A 08/12/2024   Procedure: left bone flap removal;  Surgeon: Rosslyn Dino HERO, MD;  Location: [facility] OR;  Service: Neurosurgery;  Laterality: N/A;   CRANIOPLASTY, WITH BONE FLAP REPLACEMENT Left 07/29/2024   Procedure: CRANIOPLASTY, WITH BONE FLAP REPLACEMENT;  Surgeon: Rosslyn Dino HERO, MD;  Location: [facility] OR;  Service: Neurosurgery;  Laterality: Left;  LEFT CRANIOPLASTY WITH ARTIFICAL BONE FLAP REPLACEMENT   CRANIOTOMY Left 06/30/2024   Procedure: CRANIOTOMY HEMATOMA EVACUATION SUBDURAL;  Surgeon: Melonie Grass, MD;  Location: [facility] ORS;  Service: Neurosurgery;  Laterality: Left;     Medications:   Current Facility-Administered Medications:    acetaminophen  (TYLENOL ) tablet 650 mg, 650 mg, Oral, Q4H PRN, 650 mg at 09/04/24 0939 **OR** [PENDING] acetaminophen  (TYLENOL ) tablet 650 mg, 650 mg, Per Tube, Q4H PRN **OR** acetaminophen  (TYLENOL ) suppository 650 mg, 650 mg, Rectal, Q4H PRN **OR** [PENDING] acetaminophen  (TYLENOL ) suppository 650 mg, 650 mg, Rectal, Q4H PRN,    Ampicillin-Sulbactam (UNASYN) 3 g in sodium chloride  0.9 % 100 mL IVPB, 3 g, Intravenous, Q6H, Rai, Ripudeep K, MD, Last Rate: 200 mL/hr at 09/05/24 1139, 3 g at 09/05/24 1139   Chlorhexidine  Gluconate Cloth 2 % PADS 6 each, 6 each, Topical, Daily, Franky Redia SAILOR, MD, 6 each at 09/05/24 1000   clonazePAM  (KLONOPIN ) tablet 1 mg, 1 mg, Oral, TID, Lenard Calin, MD, 1 mg at 09/04/24 2145   docusate (COLACE) 50 MG/5ML liquid 100 mg, 100  mg, Oral, Daily, Hongalgi, Anand D, MD, 100 mg at 09/05/24 1119   docusate sodium  (COLACE) capsule 100 mg, 100 mg, Oral, BID PRN, Autry, Lauren E, PA-C, 100 mg at 08/25/24 0847   feeding supplement (ENSURE PLUS HIGH PROTEIN) liquid 237 mL, 237 mL, Oral, BID BM, Danford, Lonni SQUIBB, MD, 237 mL at 09/02/24 0926   feeding supplement (OSMOLITE 1.5 CAL) liquid 1,000 mL, 1,000 mL, Per Tube, Continuous, Rai, Ripudeep K, MD, Last Rate: 60 mL/hr at 09/04/24 1835, Infusion Verify at 09/04/24 1835   feeding supplement (PROSource TF20) liquid 60 mL, 60 mL, Per Tube, Daily, Rai, Ripudeep K, MD, 60 mL at 09/05/24 1118   ferrous sulfate tablet 325 mg, 325  mg, Oral, Q breakfast, Regalado, Belkys A, MD, 325 mg at 09/04/24 9060   folic acid  (FOLVITE ) tablet 1 mg, 1 mg, Oral, Daily, Ogbata, Sylvester I, MD, 1 mg at 09/05/24 1119   free water 100 mL, 100 mL, Per Tube, Q4H, Rai, Ripudeep K, MD, 100 mL at 09/05/24 1158   gabapentin  (NEURONTIN ) capsule 200 mg, 200 mg, Oral, TID, Danford, Lonni SQUIBB, MD, 200 mg at 09/05/24 1119   guaiFENesin -dextromethorphan (ROBITUSSIN DM) 100-10 MG/5ML syrup 5 mL, 5 mL, Oral, Q4H PRN, Pudota, Ellouise SQUIBB, MD   heparin  injection 5,000 Units, 5,000 Units, Subcutaneous, Q8H, Desai, Rahul P, PA-C, 5,000 Units at 09/05/24 9446   labetalol  (NORMODYNE ) injection 10-40 mg, 10-40 mg, Intravenous, Q10 min PRN, Janjua, Rashid M, MD   lactated ringers  infusion, , Intravenous, Continuous, Regalado, Belkys A, MD, Last Rate: 75 mL/hr at 09/05/24 0338, New Bag at 09/05/24 9661   LORazepam  (ATIVAN ) injection 0.5 mg, 0.5 mg, Intravenous, Q6H PRN, Krishnan, Gokul, MD, 0.5 mg at 09/03/24 2356   magic mouthwash w/lidocaine , 5 mL, Oral, TID PRN, Regalado, Belkys A, MD, 5 mL at 09/03/24 1502   melatonin tablet 10 mg, 10 mg, Oral, QHS PRN, Shalhoub, George J, MD, 10 mg at 09/04/24 2145   mirtazapine  (REMERON ) tablet 15 mg, 15 mg, Oral, QHS, McCarty, Artie, MD, 15 mg at 09/04/24 2145   multivitamin with  minerals tablet 1 tablet, 1 tablet, Oral, Daily, Rosario Eland I, MD, 1 tablet at 09/05/24 1118   nicotine  (NICODERM CQ  - dosed in mg/24 hours) patch 14 mg, 14 mg, Transdermal, Daily, Fredia Dorothe HERO, MD, 14 mg at 09/04/24 1000   nystatin  (MYCOSTATIN ) 100000 UNIT/ML suspension 500,000 Units, 5 mL, Oral, QID, Regalado, Belkys A, MD, 500,000 Units at 09/04/24 1000   ondansetron  (ZOFRAN ) tablet 4 mg, 4 mg, Oral, Q4H PRN **OR** ondansetron  (ZOFRAN ) injection 4 mg, 4 mg, Intravenous, Q4H PRN, Janjua, Rashid M, MD, 4 mg at 08/01/24 1506   Oral care mouth rinse, 15 mL, Mouth Rinse, PRN, Mannam, Praveen, MD   pantoprazole  (PROTONIX ) EC tablet 40 mg, 40 mg, Oral, QHS, Hongalgi, Anand D, MD, 40 mg at 09/01/24 2100   phenol (CHLORASEPTIC) mouth spray 1 spray, 1 spray, Mouth/Throat, PRN, Franky Redia SAILOR, MD, 1 spray at 09/03/24 0057   polyethylene glycol (MIRALAX  / GLYCOLAX ) packet 17 g, 17 g, Oral, Daily, Desai, Rahul P, PA-C, 17 g at 09/02/24 9078   promethazine  (PHENERGAN ) tablet 12.5-25 mg, 12.5-25 mg, Oral, Q4H PRN, Janjua, Rashid M, MD   propranolol  (INDERAL ) tablet 20 mg, 20 mg, Oral, BID, Regalado, Belkys A, MD, 20 mg at 09/05/24 1119   senna (SENOKOT) tablet 17.2 mg, 2 tablet, Oral, Daily, Desai, Rahul P, PA-C, 17.2 mg at 09/05/24 1119   sodium chloride  flush (NS) 0.9 % injection 10-40 mL, 10-40 mL, Intracatheter, Q12H, Franky Redia SAILOR, MD, 10 mL at 09/04/24 2146   sodium chloride  flush (NS) 0.9 % injection 10-40 mL, 10-40 mL, Intracatheter, PRN, Franky Redia SAILOR, MD, 30 mL at 07/29/24 1135   thiamine  (VITAMIN B1) tablet 100 mg, 100 mg, Oral, Daily, Hongalgi, Anand D, MD, 100 mg at 09/05/24 1119   valbenazine  (INGREZZA ) capsule 80 mg, 80 mg, Oral, Daily, McCarty, Artie, MD, 80 mg at 09/05/24 1119   vancomycin  (VANCOREADY) IVPB 750 mg/150 mL, 750 mg, Intravenous, Q12H, Regalado, Belkys A, MD, Last Rate: 150 mL/hr at 09/05/24 1300, 750 mg at 09/05/24 1300  Allergies: No Known  Allergies  Majel GORMAN Ramp, FNP    Review of  Systems  Unable to perform ROS: Patient unresponsive  Psychiatric/Behavioral:         Not able to assess

## 2024-09-05 NOTE — Plan of Care (Signed)
  Problem: Education: Goal: Knowledge of General Education information will improve Description: Including pain rating scale, medication(s)/side effects and non-pharmacologic comfort measures Outcome: Progressing   Problem: Health Behavior/Discharge Planning: Goal: Ability to manage health-related needs will improve Outcome: Progressing   Problem: Clinical Measurements: Goal: Ability to maintain clinical measurements within normal limits will improve Outcome: Progressing Goal: Will remain free from infection Outcome: Progressing Goal: Diagnostic test results will improve Outcome: Progressing Goal: Respiratory complications will improve Outcome: Progressing Goal: Cardiovascular complication will be avoided Outcome: Progressing   Problem: Activity: Goal: Risk for activity intolerance will decrease Outcome: Progressing   Problem: Nutrition: Goal: Adequate nutrition will be maintained Outcome: Progressing   Problem: Coping: Goal: Level of anxiety will decrease Outcome: Progressing   Problem: Elimination: Goal: Will not experience complications related to bowel motility Outcome: Progressing Goal: Will not experience complications related to urinary retention Outcome: Progressing   Problem: Pain Managment: Goal: General experience of comfort will improve and/or be controlled Outcome: Progressing   Problem: Safety: Goal: Ability to remain free from injury will improve Outcome: Progressing   Problem: Skin Integrity: Goal: Risk for impaired skin integrity will decrease Outcome: Progressing   Problem: Education: Goal: Knowledge of the prescribed therapeutic regimen will improve Outcome: Progressing   Problem: Clinical Measurements: Goal: Usual level of consciousness will be regained or maintained. Outcome: Progressing Goal: Neurologic status will improve Outcome: Progressing Goal: Ability to maintain intracranial pressure will improve Outcome: Progressing    Problem: Skin Integrity: Goal: Demonstration of wound healing without infection will improve Outcome: Progressing

## 2024-09-05 NOTE — Progress Notes (Signed)
 [facility]                                                                              Ernest Romero, is a 58 y.o. male, DOB - 04-21-1966, FMW:[id number] Admit date - 07/15/2024    Outpatient Primary MD for the patient is [name]  LOS - 52  days  No chief complaint on file.      Brief summary   58 year old homeless, medical history significant for alcohol use disorder, bipolar and panic disorder, initially presented to [facility] jail in September 2025 with a large subdural hematoma.  Underwent craniotomy with flap replacement on 10/27.  Since then multiple OR visits including 10/30 for SDH evacuation, follow-up CT head 11/8 showed fluid collection underneath the bone flap, back to the OR 11/10 and then bone flap was removed, cultures were obtained intraoperative growing MRSA, treating for infected bone flap, ID following and currently on IV vancomycin .   10/27 crani w/ flap replacement 10/29: off clevi, liberate SBP goal, Klonopin , lithium , Haldol  as needed discontinued.  Latuda  dose cut by half 10/30: repeat CT head with increased collection - going to OR this afternoon for evacuation 10/31: OR  for SDH evacuation.  Developed arm twitching.  Klonopin  restarted at half dose 11/1: More awake.  No complaint 11/2 transferred out of ICU. 11/4 cortrak removed 11/8 CT head showed fluid collection under bone flap, decision made to remove bone flap 11/10 to OR for L bone flap removal 11/11 repeat CT head stable 11/29: notice to have worsening Dysphagia.  12/1 repeat CT head: stable.  12/3: Cortrack placed for nutrition, sutures removed by neurosurgery.  Tube feeds started.   Assessment & Plan    Subdural hematoma Status post initial craniotomy 9/28 (Dr. 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 - Followed by neurosurgery. -Evaluated by  infectious disease who recommends 6 weeks of IV vancomycin , EOT 09/23/2024.  After completion of IV antibiotics he will need 8 weeks of doxycycline . - Will need PICC line placed closer to discharge date. - Completed 1 week of Keppra  -  sutures were removed without complication by neurosurgery on 12/3.  EVD sutures remain intact, no signs of drainage or erythema, recommended do not remove at this time    -Acute metabolic encephalopathy - At baseline, patient reportedly without cognitive impairment, was homeless and independent caring for self. - At some point, during this hospitalization patient was deemed not to have capacity, due to encephalopathy and neurological deficits. - Per recent physicians notes patient appears to be able to make decision for himself moving forward. -Continue to monitor, capacity could fluctuates.  - patient continues to be very lethargic and sleepy all the time  - Klonopin  was reduced to  mg 3 times daily on 12/2.  CT head had shown evolving postop findings but no acute intracranial abnormalities. - Requested psychiatry to reevaluate, appears to be oversedated and needs medication adjustment.    Bipolar disorder/akathisia/drug-induced parkinsonism: - Patient with long standing history of bipolar disorder. - Patient was  on Haldol  and Latuda , developed facial tremors 11/20 concern for tardive dyskinesia. - EEG negative for seizures. - Latuda  and Haldol  discontinued. - Patient was started on propranolol  and Klonopin  -On Ingrezza . Klonopin   -Psych started patient on Valbezine, if not helping by 09/04/2024 psych will consider to discontinue it.  - Klonopin  reduce to 1 mg  TID on 12/2 due to oversedation.  May need to reduce further.  - Requested psychiatry to reevaluate    Dysphagia 11/29: Overnight after medications given with water patient felt something got stuck, he require suctioning.  -evaluated by speech. MBS: sign of aspiration.   CT head. Stable. - Speech  therapy following, placed cortrack on 12/3, tube feeds started   Aspiration PNA;  Acute hypoxic Resp failure.  -In setting of worsening dysphagia, has cough.  -Incentive spirometry.  Continue IV Unasyn for 5 days -Chest x ray showed BL increasing atelectasis.  - ABG O2 70, continue O2 via [place].    history of alcohol, tobacco use disorder: - Continue thiamine , folic acid  and multivitamin Treated for alcohol withdrawal delirium with phenobarbital  taper at [facility]. Resolved   Essential hypertension: - BP stable  Amlodipine  and losartan  has been discontinued. Propranolol  dose decreased to avoid hypotension   Constipation: Continue laxatives.    Bradycardia - Holding parameters on for propranolol     Severe malnutrition: Continue ensure.  -Started on tube feeds   Iron deficiency anemia; started  iron supplement.    Oral Thrush; started nystatin    Protein calorie malnutrition Nutrition Problem: Severe Malnutrition Etiology: acute illness Signs/Symptoms: energy intake < or equal to 50% for > or equal to 5 days, severe muscle depletion, moderate fat depletion Interventions: Tube feeding  Estimated body mass index is 21.13 kg/m as calculated from the following:   Height as of this encounter: 5' 10 (1.778 m).   Weight as of this encounter: 66.8 kg.  Code Status: Full code DVT Prophylaxis:  heparin  injection 5,000 Units Start: 08/13/24 1400 SCDs Start: 08/12/24 1150 Place TED hose Start: 08/12/24 1150 SCDs Start: 08/01/24 1745 Place and maintain sequential compression device Start: 07/29/24 1819   Level of Care: Level of care: Med-Surg Family Communication:  Disposition Plan:      Remains inpatient appropriate: Awaiting placement   Procedures:    Consultants:   Psych ID CCM Neurosurgery   Antimicrobials:   Anti-infectives (From admission, onward)    Start     Dose/Rate Route Frequency Ordered Stop   09/02/24 1700  Ampicillin-Sulbactam (UNASYN) 3 g in sodium  chloride 0.9 % 100 mL IVPB        3 g 200 mL/hr over 30 Minutes Intravenous Every 6 hours 09/02/24 1611 09/07/24 1659   08/31/24 1000  vancomycin  (VANCOREADY) IVPB 750 mg/150 mL        750 mg 150 mL/hr over 60 Minutes Intravenous Every 12 hours 08/31/24 0015     08/21/24 0800  vancomycin  (VANCOCIN ) IVPB 1000 mg/200 mL premix  Status:  Discontinued        1,000 mg 200 mL/hr over 60 Minutes Intravenous Every 12 hours 08/21/24 0720 08/31/24 0015   08/19/24 0600  vancomycin  (VANCOREADY) IVPB 1250 mg/250 mL  Status:  Discontinued        1,250 mg 166.7 mL/hr over 90 Minutes Intravenous Every 12 hours 08/18/24 1818 08/21/24 0547   08/18/24 1830  vancomycin  (VANCOREADY) IVPB 500 mg/100 mL        500 mg 100 mL/hr over 60 Minutes Intravenous  Once 08/18/24 1817 08/19/24 1642  08/16/24 1600  vancomycin  (VANCOREADY) IVPB 750 mg/150 mL  Status:  Discontinued        750 mg 150 mL/hr over 60 Minutes Intravenous Every 8 hours 08/16/24 0828 08/18/24 1818   08/14/24 0800  vancomycin  (VANCOCIN ) IVPB 1000 mg/200 mL premix  Status:  Discontinued        1,000 mg 200 mL/hr over 60 Minutes Intravenous Every 8 hours 08/14/24 0016 08/16/24 0828   08/14/24 0030  vancomycin  (VANCOCIN ) IVPB 1000 mg/200 mL premix        1,000 mg 200 mL/hr over 60 Minutes Intravenous NOW 08/14/24 0016 08/14/24 0135   08/13/24 0100  vancomycin  (VANCOREADY) IVPB 750 mg/150 mL  Status:  Discontinued        750 mg 150 mL/hr over 60 Minutes Intravenous Every 12 hours 08/12/24 1230 08/14/24 0007   08/12/24 1400  ceFAZolin  (ANCEF ) IVPB 2g/100 mL premix  Status:  Discontinued        2 g 200 mL/hr over 30 Minutes Intravenous Every 8 hours 08/12/24 1149 08/12/24 1230   08/12/24 1400  cefTAZidime  (FORTAZ ) 2 g in sodium chloride  0.9 % 100 mL IVPB  Status:  Discontinued        2 g 200 mL/hr over 30 Minutes Intravenous Every 8 hours 08/12/24 1152 08/13/24 1109   08/12/24 1245  ceFAZolin  (ANCEF ) IVPB 2g/100 mL premix  Status:  Discontinued         2 g 200 mL/hr over 30 Minutes Intravenous  Once 08/12/24 1149 08/12/24 1449   08/12/24 1245  vancomycin  (VANCOREADY) IVPB 1250 mg/250 mL        1,250 mg 166.7 mL/hr over 90 Minutes Intravenous  Once 08/12/24 1152 08/12/24 1501   08/12/24 0957  vancomycin  (VANCOCIN ) powder  Status:  Discontinued          As needed 08/12/24 1008 08/12/24 1037   08/01/24 2200  ceFAZolin  (ANCEF ) IVPB 2g/100 mL premix        2 g 200 mL/hr over 30 Minutes Intravenous Every 8 hours 08/01/24 1703 08/02/24 1915   08/01/24 1530  vancomycin  (VANCOCIN ) powder  Status:  Discontinued          As needed 08/01/24 1559 08/01/24 1606   08/01/24 1421  ceFAZolin  (ANCEF ) 2-4 GM/100ML-% IVPB       Note to Pharmacy: Roslynn Birmingham: cabinet override      08/01/24 1421 08/02/24 0229   07/29/24 2200  ceFAZolin  (ANCEF ) IVPB 2g/100 mL premix        2 g 200 mL/hr over 30 Minutes Intravenous Every 8 hours 07/29/24 1859 07/30/24 1423   07/29/24 1654  vancomycin  (VANCOCIN ) powder  Status:  Discontinued          As needed 07/29/24 1655 07/29/24 1758   07/29/24 1515  ceFAZolin  (ANCEF ) IVPB 2g/100 mL premix        2 g 200 mL/hr over 30 Minutes Intravenous  Once 07/29/24 1421 07/29/24 1655          Medications  Chlorhexidine  Gluconate Cloth  6 each Topical Daily   clonazePAM   1 mg Oral TID   docusate  100 mg Oral Daily   feeding supplement  237 mL Oral BID BM   feeding supplement (PROSource TF20)  60 mL Per Tube Daily   ferrous sulfate  325 mg Oral Q breakfast   folic acid   1 mg Oral Daily   free water  100 mL Per Tube Q4H   gabapentin   200 mg Oral TID   heparin  injection (  subcutaneous)  5,000 Units Subcutaneous Q8H   mirtazapine   15 mg Oral QHS   multivitamin with minerals  1 tablet Oral Daily   nicotine   14 mg Transdermal Daily   nystatin   5 mL Oral QID   pantoprazole   40 mg Oral QHS   polyethylene glycol  17 g Oral Daily   propranolol   20 mg Oral BID   senna  2 tablet Oral Daily   sodium chloride  flush  10-40 mL  Intracatheter Q12H   thiamine   100 mg Oral Daily   valbenazine   80 mg Oral Daily      Subjective:   Ernest Romero was seen and examined today.  Appears to be oversedated, sitter at the bedside.  No acute issues reported overnight.    Objective:   Vitals:   09/05/24 0102 09/05/24 0427 09/05/24 0711 09/05/24 1107  BP: 109/75 116/85 108/80 111/82  Pulse: (!) 48 (!) 45 (!) 46 (!) 50  Resp:  18 19 19   Temp:  98.6 F (37 C) 97.7 F (36.5 C) 97.6 F (36.4 C)  TempSrc:  Oral Oral Oral  SpO2:  97% 97% 95%  Weight:      Height:        Intake/Output Summary (Last 24 hours) at 09/05/2024 1231 Last data filed at 09/05/2024 1158 Gross per 24 hour  Intake 3752.46 ml  Output 903 ml  Net 2849.46 ml     Wt Readings from Last 3 Encounters:  09/02/24 66.8 kg  07/15/24 67.5 kg  06/16/24 78.9 kg    Physical Exam General: Somnolent, NAD  Cardiovascular: S1 S2 clear, RRR.  Respiratory: CTAB, no wheezing, rales or rhonchi Gastrointestinal: Soft, nontender, nondistended, NBS Ext: no pedal edema bilaterally Neuro: Unable to assess Psych: Somnolent   Data Reviewed:  I have personally reviewed following labs    CBC Lab Results  Component Value Date   WBC 3.9 (L) 09/03/2024   RBC 3.99 (L) 09/03/2024   HGB 11.7 (L) 09/03/2024   HCT 35.7 (L) 09/03/2024   MCV 89.5 09/03/2024   MCH 29.3 09/03/2024   PLT 239 09/03/2024   MCHC 32.8 09/03/2024   RDW 12.7 09/03/2024   LYMPHSABS 2.3 07/16/2024   MONOABS 0.7 07/16/2024   EOSABS 0.2 07/16/2024   BASOSABS 0.1 07/16/2024     Last metabolic panel Lab Results  Component Value Date   NA 143 09/05/2024   K 4.3 09/05/2024   CL 110 09/05/2024   CO2 22 09/05/2024   BUN 18 09/05/2024   CREATININE 1.03 09/05/2024   GLUCOSE 79 09/05/2024   GFRNONAA >60 09/05/2024   GFRAA >60 09/16/2018   CALCIUM 9.1 09/05/2024   PHOS 3.9 08/26/2024   PROT 6.1 (L) 08/28/2024   ALBUMIN  3.1 (L) 08/28/2024   BILITOT 0.7 08/28/2024   ALKPHOS 69  08/28/2024   AST 26 08/28/2024   ALT 40 08/28/2024   ANIONGAP 11 09/05/2024    CBG (last 3)  Recent Labs    09/04/24 2359 09/05/24 0801 09/05/24 1103  GLUCAP 126* 125* 129*      Coagulation Profile: No results for input(s): INR, PROTIME in the last 168 hours.   Radiology Studies: I have personally reviewed the imaging studies  DG Swallowing Func-Speech Pathology Result Date: 09/04/2024 Table formatting from the original result was not included. Modified Barium Swallow Study Patient Details Name: Ernest Romero MRN: [id number] Date of Birth: Feb 19, 1966 Today's Date: 09/04/2024 HPI/PMH: HPI: Patient is a 58 y.o. male who was initially admitted to [facility] on  06/30/24 after an unresponsive episode in jail. He was found to have a large SDH. He underwent a left frontoparietal craniotomy with evacuation of SDH and drain placement 9/28 (drain removed 9/29). SLP was following patient at [facility] for cognitive-linguistic goals. He was on a regular solids, thin liquids diet prior to transfer to [facility] on 10/14 for further management of craniotomy and flap. On 10/27, he underwent cranioplasty with bone flap replacement. On 10/28 he had a change in cognitive status thought to be due to Klonopin  but repeat CTH showed significant increase in size of mixed attenuation subdural fluid collection underlying the left tempoparietal craniotomy site with associated mild mass effect and slight rightward midline shift. He underwent left burr hole for evacuation on 10/30. He underwent left bone flap removal on 11/10 after developing SDH s/p burr hole evacuation followed by swelling and suspected infection. 10/30 CXR without acute cardiopulmonary abnormality. SLP evaluated patient for swallow function at bedside on 10/28 recommending NPO due to change in mentation but diet initiated on 10/31 following PO trials with patient's alertness improving and a dysphagia 3 (mechanical soft) solids, thin liquids diet was recommended. RN  requested SLP reassess patient's swallowing on 11/18 due to concerns of coughing when drinking liquids. Patient reassessed at bedside and continuation of Dys 3, thin liquids diet recommended with careful monitoring. SLP swallow evaluation reordered on 11/28 due to RN observing patient to cough when taking medications leading to MD changing patient to NPO awaiting SLP reassessment. MBS completed on 11/29 which reported severe oropharyngeal dysphagia with aspiration of all liquids and puree solids. SLP recommended NPO status awaiting repeat chest imaging. MD and SLP in agreement to initiate PO diet of full liquids/thin consistency after repeat chest imaging indicating patient's lungs were clear. Patient reportedly continued to struggle with PO diet and decision was made for NPO status but allowing ice chips and water sips PRN. Repeat CTH on 12/1 reported: Evolving postoperative changes from left sided hemicraniectomy and bone flap removal without evidence of an acute intracranial abnormality or significant residual subdural collection. MD reduced patient's Clonazepam  dose in hopes of improving his alertness. Clinical Impression: Recs: NPO except necessary meds crushed in puree, Cortrak for nutrition, therapeutic trials of honey thick liquids and puree solids with SLP As etiology of this acute change in his swallow function is unknown, we will need to revisit next week to determine if there has been any functional change in his swallow or if longer term artificial means of nutrition would be needed. In addition, further conversations may need to be had with patient and/or POA regarding GOC. Clinical Impression: Patient's swallow function appears similar to MBS completed on 08/31/24 and he continues with silent aspiration of gross amounts. Chin tuck posture was beneficial with honey thick liquids, resulting in full epiglottic inversion and complete laryngeal vestibule closure as well as clearance of majority of bolus  and no observed increase in vallecular or pyriform sinus residuals which were present from previous swallows. DIGEST Swallow Severity Rating*  Safety: 4  Efficiency: 3  Overall Pharyngeal Swallow Severity: 4 1: mild; 2: moderate; 3: severe; 4: profound *The Dynamic Imaging Grade of Swallowing Toxicity is standardized for the head and neck cancer population, however, demonstrates promising clinical applications across populations to standardize the clinical rating of pharyngeal swallow safety and severity. Factors that may increase risk of adverse event in presence of aspiration Noe & Lianne 2021): Factors that may increase risk of adverse event in presence of aspiration Noe & Lianne 2021): Aspiration  of thick, dense, and/or acidic materials; Frequent aspiration of large volumes; Reduced cognitive function; Frail or deconditioned Recommendations/Plan: Swallowing Evaluation Recommendations Swallowing Evaluation Recommendations Recommendations: NPO except meds Medication Administration: Crushed with puree Supervision: Full supervision/cueing for swallowing strategies Swallowing strategies  : Slow rate; Small bites/sips Postural changes: Position pt fully upright for meals Oral care recommendations: Oral care BID (2x/day) Treatment Plan Treatment Plan Treatment recommendations: Therapy as outlined in treatment plan below Follow-up recommendations: Skilled nursing-short term rehab (<3 hours/day) Functional status assessment: Patient has had a recent decline in their functional status and demonstrates the ability to make significant improvements in function in a reasonable and predictable amount of time. Treatment frequency: Min 4x/week Treatment duration: 2 weeks Interventions: Aspiration precaution training; Diet toleration management by SLP; Trials of upgraded texture/liquids Recommendations Recommendations for follow up therapy are one component of a multi-disciplinary discharge planning process, led by the  attending physician.  Recommendations may be updated based on patient status, additional functional criteria and insurance authorization. Assessment: Orofacial Exam: Orofacial Exam Oral Cavity: Oral Hygiene: Xerostomia Oral Cavity - Dentition: Poor condition; Missing dentition Orofacial Anatomy: WFL Anatomy: Anatomy: WFL Boluses Administered: Boluses Administered Boluses Administered: Moderately thick liquids (Level 3, honey thick); Puree; Thin liquids (Level 0)  Oral Impairment Domain: Oral Impairment Domain Lip Closure: Escape beyond mid-chin Tongue control during bolus hold: Not tested Bolus transport/lingual motion: Repetitive/disorganized tongue motion; Slow tongue motion Oral residue: Residue collection on oral structures Location of oral residue : Tongue; Palate Initiation of pharyngeal swallow : Valleculae  Pharyngeal Impairment Domain: Pharyngeal Impairment Domain Soft palate elevation: No bolus between soft palate (SP)/pharyngeal wall (PW) Laryngeal elevation: Minimal superior movement of thyroid cartilage with minimal approximation of arytenoids to epiglottic petiole Anterior hyoid excursion: Complete anterior movement Laryngeal vestibule closure: Incomplete, narrow column air/contrast in laryngeal vestibule Pharyngeal contraction (A/P view only): N/A Pharyngoesophageal segment opening: Minimal distention/minimal duration, marked obstruction of flow Tongue base retraction: Trace column of contrast or air between tongue base and PPW Pharyngeal residue: Collection of residue within or on pharyngeal structures Location of pharyngeal residue: Pyriform sinuses; Tongue base; Valleculae; Pharyngeal wall; Diffuse (>3 areas)  Esophageal Impairment Domain: Esophageal Impairment Domain Esophageal clearance upright position: Complete clearance, esophageal coating Pill: No data recorded Penetration/Aspiration Scale Score: Penetration/Aspiration Scale Score 1.  Material does not enter airway: Puree 8.  Material enters  airway, passes BELOW cords without attempt by patient to eject out (silent aspiration) : Thin liquids (Level 0); Moderately thick liquids (Level 3, honey thick) Compensatory Strategies: Compensatory Strategies Compensatory strategies: Yes Straw: Ineffective Ineffective Straw: Moderately thick liquid (Level 3, honey thick) Effortful swallow: Ineffective Ineffective Effortful Swallow: Thin liquid (Level 0) Chin tuck: Effective Effective Chin Tuck: Moderately thick liquid (Level 3, honey thick); Puree Left head turn: Ineffective Ineffective Left Head Turn: Thin liquid (Level 0) Right head turn: Ineffective Ineffective Right Head Turn: Thin liquid (Level 0)   General Information: No data recorded Diet Prior to this Study: NPO   Temperature : Normal   Respiratory Status: WFL   Supplemental O2: None (Room air)   History of Recent Intubation: Yes  Behavior/Cognition: Alert; Cooperative; Pleasant mood Self-Feeding Abilities: Needs assist with self-feeding Baseline vocal quality/speech: Normal Volitional Cough: Able to elicit Volitional Swallow: Able to elicit Exam Limitations: No limitations Goal Planning: Prognosis for improved oropharyngeal function: Fair Barriers to Reach Goals: Severity of deficits No data recorded Patient/Family Stated Goal: patient wants his involuntary jaw movement to stop, he wants to be able to eat and drink Consulted  and agree with results and recommendations: Patient Pain: Pain Assessment Pain Assessment: No/denies pain Faces Pain Scale: 0 Pain Location: jaw from involuntary lateral movement Pain Descriptors / Indicators: Discomfort Pain Intervention(s): Monitored during session End of Session: Start Time:SLP Start Time (ACUTE ONLY): 1322 Stop Time: SLP Stop Time (ACUTE ONLY): 1350 Time Calculation:SLP Time Calculation (min) (ACUTE ONLY): 28 min Charges: SLP Evaluations $ SLP Speech Visit: 1 Visit SLP Evaluations $MBS Swallow: 1 Procedure $Swallowing Treatment: 1 Procedure SLP visit diagnosis:  SLP Visit Diagnosis: Dysphagia, oropharyngeal phase (R13.12) Past Medical History: Past Medical History: Diagnosis Date  Anxiety   Bipolar 1 disorder (HCC)   Depression   Hepatitis   Hypertension   MRSA (methicillin resistant Staphylococcus aureus)   5/25 arm abscess and 11/25 Past Surgical History: Past Surgical History: Procedure Laterality Date  BURR HOLE N/A 08/01/2024  Procedure: CREATION, CRANIAL BURR HOLE WITH EVACUATION OF HEMATOMA;  Surgeon: Rosslyn Dino HERO, MD;  Location: [facility] OR;  Service: Neurosurgery;  Laterality: N/A;  BURR HOLE N/A 08/12/2024  Procedure: left bone flap removal;  Surgeon: Rosslyn Dino HERO, MD;  Location[facility] OR;  Service: Neurosurgery;  Laterality: N/A;  CRANIOPLASTY, WITH BONE FLAP REPLACEMENT Left 07/29/2024  Procedure: CRANIOPLASTY, WITH BONE FLAP REPLACEMENT;  Surgeon: Rosslyn Dino HERO, MD;  Location: [facility] OR;  Service: Neurosurgery;  Laterality: Left;  LEFT CRANIOPLASTY WITH ARTIFICAL BONE FLAP REPLACEMENT  CRANIOTOMY Left 06/30/2024  Procedure: CRANIOTOMY HEMATOMA EVACUATION SUBDURAL;  Surgeon: Melonie Grass, MD;  Location: [facility] ORS;  Service: Neurosurgery;  Laterality: Left; Norleen IVAR Blase, MA, CCC-SLP Speech Therapy 09/04/2024, 5:20 PM  DG Abd Portable 1V Result Date: 09/04/2024 EXAM: 1 VIEW XRAY OF THE ABDOMEN 09/04/2024 03:25:00 PM COMPARISON: 07/31/2024 CLINICAL HISTORY: Encounter for feeding tube placement [id number] FINDINGS: LINES, TUBES AND DEVICES: Weighted tip enteric tube in place with tip overlying distal descending duodenum. BOWEL: Nonobstructive bowel gas pattern. SOFT TISSUES: No opaque urinary calculi. BONES: No acute osseous abnormality. IMPRESSION: 1. Weighted-tip enteric tube with the tip overlying the distal descending duodenum, appropriate position for post-pyloric feeding tube placement. Electronically signed by: Lonni Necessary MD 09/04/2024 04:28 PM EST RP Workstation: [id number]       Nydia Distance M.D. [facility] 09/05/2024, 12:31  PM  Available via Epic secure chat 7am-7pm After 7 pm, please refer to night coverage provider listed on amion.

## 2024-09-05 NOTE — Progress Notes (Signed)
 Physical Therapy Treatment Patient Details Name: Ernest Romero MRN: [id number] DOB: 1966-02-20 Today's Date: 09/05/2024   History of Present Illness Pt is a 59 y.o. male presenting 10/14 from [facility] where he was admitted 9/[facility] episode in the [facility]. 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 On CIWA protocol. Transferred to [facility] for further management of craniotomy and flap. 10/27 s/p cranioplasty with bone flap replacement. 10/28 Change in status thought to be related to Klonopin  administration 10/28, repeat CTH shows significant increase in size of mixed attenuation subdural fluid collection underlying the L tempoparietal craniotomy site, now measuring approximately 15 mm in thickness, with associated mild mass effect and slight rightward midline shift. OR on 10/30 for SDH evacuation. [address] [facility] 11/7 with fluid collected under bone flap. 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 PMH alcohol use disorder, psychiatric disorder, HTN    PT Comments  Pt was agreeable to and very participative in therapy, progressing well toward goals, though much to go to be at Supervision level.  Emphasis on transition to EOB, sitting balance, scooting, STS (working to come further forward before standing up) and gait with the RW working on propelling, gait speed, general balance and stamina.     If plan is discharge home, recommend the following: A lot of help with bathing/dressing/bathroom;Assistance with cooking/housework;Direct supervision/assist for medications management;Direct supervision/assist for financial management;Assist for transportation;Help with stairs or ramp for entrance;Supervision due to cognitive status;A little help with walking and/or transfers   Can travel by private vehicle     Yes  Equipment Recommendations  Wheelchair (measurements PT);Wheelchair cushion (measurements PT);Rolling walker (2  wheels)    Recommendations for Other Services       Precautions / Restrictions Precautions Precautions: Fall     Mobility  Bed Mobility Overal bed mobility: Needs Assistance Bed Mobility: Supine to Sit     Supine to sit: Min assist Sit to supine: Min assist   General bed mobility comments: slow with need for assist forward    Transfers Overall transfer level: Needs assistance   Transfers: Sit to/from Stand Sit to Stand: Min assist   Step pivot transfers: Min assist            Ambulation/Gait Ambulation/Gait assistance: Min assist Gait Distance (Feet): 240 Feet Assistive device: Rolling walker (2 wheels) Gait Pattern/deviations: Step-through pattern   Gait velocity interpretation: 1.31 - 2.62 ft/sec, indicative of limited community ambulator   General Gait Details: mildly unsteady and retropulsive, straight, posterior biased posture, short stride.  pt able to give notable, but insignificant burst of speed to cue.   Stairs             Wheelchair Mobility     Tilt Bed    Modified Rankin (Stroke Patients Only)       Balance Overall balance assessment: Needs assistance Sitting-balance support: Feet supported Sitting balance-Leahy Scale: Fair (initially poor with slow, but consistent posterior bias) Sitting balance - Comments: posterior bias requiring cueing and able to self correct, one time posterior LOB but generally able to maintain upright midline for drinking and helmet donning/doffing Postural control: Posterior lean   Standing balance-Leahy Scale: Poor Standing balance comment: able to cue for forward w/s, but has a posterior bias if not cued  Communication Communication Communication: Impaired Factors Affecting Communication: Reduced clarity of speech  Cognition Arousal: Alert Behavior During Therapy: Flat affect                       Rancho Levels of Cognitive Functioning [place]  Amigos Scales of Cognitive Functioning: Automatic, Appropriate: Minimal Assistance for Daily Living Skills [place]igos Scales of Cognitive Functioning: Automatic, Appropriate: Minimal Assistance for Daily Living Skills [VII] PT - Cognition Comments: able to hold basic conversation and express needs to PT and nursing staff, but needed heavy cues for sequencing and path finding Following commands: Impaired Following commands impaired: Follows one step commands with increased time    Cueing Cueing Techniques: Verbal cues  Exercises      General Comments General comments (skin integrity, edema, etc.): pt with flat affect, reporting feeling depressed, and not wanting to participate much with OT. Focused on cognitive re-orientation and facilitating for problem solving through his rehab/recovery process.      Pertinent Vitals/Pain Pain Assessment Pain Assessment: Faces Faces Pain Scale: Hurts a little bit Pain Location: mouth/jaw Pain Descriptors / Indicators: Discomfort Pain Intervention(s): Patient requesting pain meds-RN notified, Monitored during session    Home Living                          Prior Function            PT Goals (current goals can now be found in the care plan section) Acute Rehab PT Goals Patient Stated Goal: none stated PT Goal Formulation: With patient Time For Goal Achievement: 09/06/24 Potential to Achieve Goals: Fair Progress towards PT goals: Progressing toward goals    Frequency    Min 2X/week      PT Plan      Co-evaluation              AM-PAC PT 6 Clicks Mobility   Outcome Measure  Help needed turning from your back to your side while in a flat bed without using bedrails?: A Little Help needed moving from lying on your back to sitting on the side of a flat bed without using bedrails?: A Lot Help needed moving to and from a bed to a chair (including a wheelchair)?: A Lot Help needed standing up from a chair using  your arms (e.g., wheelchair or bedside chair)?: A Little Help needed to walk in hospital room?: A Little Help needed climbing 3-5 steps with a railing? : Total 6 Click Score: 14    End of Session   Activity Tolerance: Patient limited by fatigue Patient left: in bed;with call bell/phone within reach;with nursing/sitter in room Nurse Communication: Mobility status PT Visit Diagnosis: Other symptoms and signs involving the nervous system (R29.898);Muscle weakness (generalized) (M62.81)     Time: 8394-8375 PT Time Calculation (min) (ACUTE ONLY): 19 min  Charges:    $Gait Training: 8-22 mins PT General Charges $$ ACUTE PT VISIT: 1 Visit                     09/05/2024  India HERO., PT Acute Rehabilitation Services [phone]  (office)   Vinie GAILS Johaan Ryser 09/05/2024, 5:08 PM

## 2024-09-06 LAB — GLUCOSE, CAPILLARY
Glucose-Capillary: 119 mg/dL — ABNORMAL HIGH (ref 70–99)
Glucose-Capillary: 122 mg/dL — ABNORMAL HIGH (ref 70–99)
Glucose-Capillary: 107 mg/dL — ABNORMAL HIGH (ref 70–99)
Glucose-Capillary: 112 mg/dL — ABNORMAL HIGH (ref 70–99)

## 2024-09-06 LAB — VANCOMYCIN, TROUGH: Vancomycin Tr: 15 ug/mL (ref 15–20)

## 2024-09-06 NOTE — Progress Notes (Signed)
 Patient anxious.  He has a 1:1 sitter in his room who advised me that patient was sitting on edge of the bed, and he was told not to get up.  Patient decided to slide off the bed and sit on the floor.  Due to the fact that his buttocks touched the floor, a fall must be reported.  The sitter, who is also a tech, assisted patient off the floor and back into his bed.  Vitals were taken and recorded. Patient had no injuries.

## 2024-09-06 NOTE — Plan of Care (Signed)
  Problem: Education: Goal: Knowledge of General Education information will improve Description: Including pain rating scale, medication(s)/side effects and non-pharmacologic comfort measures Outcome: Progressing   Problem: Health Behavior/Discharge Planning: Goal: Ability to manage health-related needs will improve Outcome: Progressing   Problem: Clinical Measurements: Goal: Ability to maintain clinical measurements within normal limits will improve Outcome: Progressing Goal: Will remain free from infection Outcome: Progressing Goal: Diagnostic test results will improve Outcome: Progressing Goal: Respiratory complications will improve Outcome: Progressing Goal: Cardiovascular complication will be avoided Outcome: Progressing   Problem: Activity: Goal: Risk for activity intolerance will decrease Outcome: Progressing   Problem: Nutrition: Goal: Adequate nutrition will be maintained Outcome: Progressing   Problem: Coping: Goal: Level of anxiety will decrease Outcome: Progressing   Problem: Elimination: Goal: Will not experience complications related to bowel motility Outcome: Progressing Goal: Will not experience complications related to urinary retention Outcome: Progressing   Problem: Pain Managment: Goal: General experience of comfort will improve and/or be controlled Outcome: Progressing   Problem: Safety: Goal: Ability to remain free from injury will improve Outcome: Progressing   Problem: Skin Integrity: Goal: Risk for impaired skin integrity will decrease Outcome: Progressing   Problem: Education: Goal: Knowledge of the prescribed therapeutic regimen will improve Outcome: Progressing   Problem: Clinical Measurements: Goal: Usual level of consciousness will be regained or maintained. Outcome: Progressing Goal: Neurologic status will improve Outcome: Progressing Goal: Ability to maintain intracranial pressure will improve Outcome: Progressing    Problem: Skin Integrity: Goal: Demonstration of wound healing without infection will improve Outcome: Progressing

## 2024-09-06 NOTE — Progress Notes (Signed)
 [facility]                                                                              Ernest Romero, is a 58 y.o. male, DOB - 1966-01-21, FMW:[id number] Admit date - 07/15/2024    Outpatient Primary MD for the patient is [name]  LOS - 53  days  No chief complaint on file.      Brief summary   58 year old homeless, medical history significant for alcohol use disorder, bipolar and panic disorder, initially presented to [facility] from jail in September 2025 with a large subdural hematoma.  Underwent craniotomy with flap replacement on 10/27.  Since then multiple OR visits including 10/30 for SDH evacuation, follow-up CT head 11/8 showed fluid collection underneath the bone flap, back to the OR 11/10 and then bone flap was removed, cultures were obtained intraoperative growing MRSA, treating for infected bone flap, ID following and currently on IV vancomycin .   10/27 crani w/ flap replacement 10/29: off clevi, liberate SBP goal, Klonopin , lithium , Haldol  as needed discontinued.  Latuda  dose cut by half 10/30: repeat CT head with increased collection - going to OR this afternoon for evacuation 10/31: OR  for SDH evacuation.  Developed arm twitching.  Klonopin  restarted at half dose 11/1: More awake.  No complaint 11/2 transferred out of ICU. 11/4 cortrak removed 11/8 CT head showed fluid collection under bone flap, decision made to remove bone flap 11/10 to OR for L bone flap removal 11/11 repeat CT head stable 11/29: notice to have worsening Dysphagia.  12/1 repeat CT head: stable.  12/3: Cortrack placed for nutrition, sutures removed by neurosurgery.  Tube feeds started. 12/4: Very sedated, psychiatry requested to readjust meds, reduced clonazepam  to0.5mg  BID, decreased valbenazine  to 40 mg daily 12/5: Noted oriented but jaw tremors worse.     Assessment & Plan    Subdural hematoma Status post initial craniotomy 9/28 (Dr. Loa poor  barr) Status post skull flap left cranioplasty 10/27 ( [name]) Complicated by MRSA infected bone flap status post removal 11/10. - Followed by neurosurgery. -Evaluated by infectious disease who recommends 6 weeks of IV vancomycin , EOT 09/23/2024.  After completion of IV antibiotics he will need 8 weeks of doxycycline . - Will need PICC line placed closer to discharge date. - Completed 1 week of Keppra  -  sutures were removed without complication by neurosurgery on 12/3.  EVD sutures remain intact, no signs of drainage or erythema, recommended do not remove at this time    -Acute metabolic encephalopathy - At baseline, patient reportedly without cognitive impairment, was homeless and independent caring for self. - At some point, during this hospitalization patient was deemed not to have capacity, due to encephalopathy and neurological deficits. - Per recent physicians notes patient appears to be able to make decision for himself moving forward. Continue to monitor, capacity could fluctuates.  - Today alert and oriented however jaw tremor much worse.  Patient was too oversedated before and psychiatry readjusted medications to reduce clonazepam  and valbenazine     Bipolar disorder/akathisia/drug-induced parkinsonism: - Patient with long standing history of bipolar disorder. - Patient  was on Haldol  and Latuda , developed facial tremors 11/20 concern for tardive dyskinesia. - EEG negative for seizures. - Latuda  and Haldol  discontinued. - Patient was started on propranolol  and Klonopin .  On Ingrezza  -Due to oversedation, Klonopin  decreased to 0.5 mg twice daily and valbenazine  decreased to 40 mg daily on 12/4 - Today alert and oriented however worsening jaw tremor/akathisia.  Discussed with psychiatry and neurosurgery, Dr. Janjua if it is related to his frontotemporal surgery.  Dr. Janjua recommended Neurology consult.  Unclear if patient had these tremors while he he was on Keppra .     Dysphagia 11/29: Overnight after medications given with water  patient felt something got stuck, he require suctioning.  -evaluated by speech. MBS: sign of aspiration.   CT head. Stable. - Speech therapy following, placed cortrack on 12/3, tube feeds started   Aspiration PNA;  Acute hypoxic Resp failure.  -In setting of worsening dysphagia, has cough.  -Incentive spirometry.  Continue IV Unasyn  for 5 days -Chest x ray showed BL increasing atelectasis.  - O2 sats 95% on room air    history of alcohol, tobacco use disorder: - Continue thiamine , folic acid  and multivitamin Treated for alcohol withdrawal delirium with phenobarbital  taper at [facility]. Resolved   Essential hypertension: - BP stable  Amlodipine  and losartan  has been discontinued. Propranolol  dose decreased to avoid hypotension   Constipation: Continue laxatives.    Bradycardia - Holding parameters on for propranolol     Severe malnutrition: Continue ensure.  -Started on tube feeds   Iron deficiency anemia; started  iron supplement.    Oral Thrush; started nystatin    Protein calorie malnutrition Nutrition Problem: Severe Malnutrition Etiology: acute illness Signs/Symptoms: energy intake < or equal to 50% for > or equal to 5 days, severe muscle depletion, moderate fat depletion Interventions: Tube feeding  Estimated body mass index is 19.23 kg/m as calculated from the following:   Height as of this encounter: 5' 10 (1.778 m).   Weight as of this encounter: 60.8 kg.  Code Status: Full code DVT Prophylaxis:  heparin  injection 5,000 Units Start: 08/13/24 1400 SCDs Start: 08/12/24 1150 Place TED hose Start: 08/12/24 1150 SCDs Start: 08/01/24 1745 Place and maintain sequential compression device Start: 07/29/24 1819   Level of Care: Level of care: Med-Surg Family Communication:  Disposition Plan:      Remains inpatient appropriate: Awaiting placement   Procedures:    Consultants:    Psych ID CCM Neurosurgery   Antimicrobials:   Anti-infectives (From admission, onward)    Start     Dose/Rate Route Frequency Ordered Stop   09/02/24 1700  Ampicillin -Sulbactam (UNASYN ) 3 g in sodium chloride  0.9 % 100 mL IVPB        3 g 200 mL/hr over 30 Minutes Intravenous Every 6 hours 09/02/24 1611 09/07/24 1659   08/31/24 1000  vancomycin  (VANCOREADY) IVPB 750 mg/150 mL        750 mg 150 mL/hr over 60 Minutes Intravenous Every 12 hours 08/31/24 0015     08/21/24 0800  vancomycin  (VANCOCIN ) IVPB 1000 mg/200 mL premix  Status:  Discontinued        1,000 mg 200 mL/hr over 60 Minutes Intravenous Every 12 hours 08/21/24 0720 08/31/24 0015   08/19/24 0600  vancomycin  (VANCOREADY) IVPB 1250 mg/250 mL  Status:  Discontinued        1,250 mg 166.7 mL/hr over 90 Minutes Intravenous Every 12 hours 08/18/24 1818 08/21/24 0547   08/18/24 1830  vancomycin  (VANCOREADY) IVPB 500 mg/100 mL  500 mg 100 mL/hr over 60 Minutes Intravenous  Once 08/18/24 1817 08/19/24 1642   08/16/24 1600  vancomycin  (VANCOREADY) IVPB 750 mg/150 mL  Status:  Discontinued        750 mg 150 mL/hr over 60 Minutes Intravenous Every 8 hours 08/16/24 0828 08/18/24 1818   08/14/24 0800  vancomycin  (VANCOCIN ) IVPB 1000 mg/200 mL premix  Status:  Discontinued        1,000 mg 200 mL/hr over 60 Minutes Intravenous Every 8 hours 08/14/24 0016 08/16/24 0828   08/14/24 0030  vancomycin  (VANCOCIN ) IVPB 1000 mg/200 mL premix        1,000 mg 200 mL/hr over 60 Minutes Intravenous NOW 08/14/24 0016 08/14/24 0135   08/13/24 0100  vancomycin  (VANCOREADY) IVPB 750 mg/150 mL  Status:  Discontinued        750 mg 150 mL/hr over 60 Minutes Intravenous Every 12 hours 08/12/24 1230 08/14/24 0007   08/12/24 1400  ceFAZolin  (ANCEF ) IVPB 2g/100 mL premix  Status:  Discontinued        2 g 200 mL/hr over 30 Minutes Intravenous Every 8 hours 08/12/24 1149 08/12/24 1230   08/12/24 1400  cefTAZidime  (FORTAZ ) 2 g in sodium chloride  0.9 %  100 mL IVPB  Status:  Discontinued        2 g 200 mL/hr over 30 Minutes Intravenous Every 8 hours 08/12/24 1152 08/13/24 1109   08/12/24 1245  ceFAZolin  (ANCEF ) IVPB 2g/100 mL premix  Status:  Discontinued        2 g 200 mL/hr over 30 Minutes Intravenous  Once 08/12/24 1149 08/12/24 1449   08/12/24 1245  vancomycin  (VANCOREADY) IVPB 1250 mg/250 mL        1,250 mg 166.7 mL/hr over 90 Minutes Intravenous  Once 08/12/24 1152 08/12/24 1501   08/12/24 0957  vancomycin  (VANCOCIN ) powder  Status:  Discontinued          As needed 08/12/24 1008 08/12/24 1037   08/01/24 2200  ceFAZolin  (ANCEF ) IVPB 2g/100 mL premix        2 g 200 mL/hr over 30 Minutes Intravenous Every 8 hours 08/01/24 1703 08/02/24 1915   08/01/24 1530  vancomycin  (VANCOCIN ) powder  Status:  Discontinued          As needed 08/01/24 1559 08/01/24 1606   08/01/24 1421  ceFAZolin  (ANCEF ) 2-4 GM/100ML-% IVPB       Note to Pharmacy: Ernest Romero: cabinet override      08/01/24 1421 08/02/24 0229   07/29/24 2200  ceFAZolin  (ANCEF ) IVPB 2g/100 mL premix        2 g 200 mL/hr over 30 Minutes Intravenous Every 8 hours 07/29/24 1859 07/30/24 1423   07/29/24 1654  vancomycin  (VANCOCIN ) powder  Status:  Discontinued          As needed 07/29/24 1655 07/29/24 1758   07/29/24 1515  ceFAZolin  (ANCEF ) IVPB 2g/100 mL premix        2 g 200 mL/hr over 30 Minutes Intravenous  Once 07/29/24 1421 07/29/24 1655          Medications  Chlorhexidine  Gluconate Cloth  6 each Topical Daily   clonazePAM   0.5 mg Oral BID   docusate  100 mg Oral Daily   feeding supplement  237 mL Oral BID BM   feeding supplement (PROSource TF20)  60 mL Per Tube Daily   ferrous sulfate   325 mg Oral Q breakfast   folic acid   1 mg Oral Daily   free water   100  mL Per Tube Q4H   gabapentin   200 mg Oral TID   heparin  injection (subcutaneous)  5,000 Units Subcutaneous Q8H   mirtazapine   15 mg Oral QHS   multivitamin with minerals  1 tablet Oral Daily   nicotine   14 mg  Transdermal Daily   nystatin   5 mL Oral QID   pantoprazole   40 mg Oral QHS   polyethylene glycol  17 g Oral Daily   propranolol   20 mg Oral BID   senna  2 tablet Oral Daily   sodium chloride  flush  10-40 mL Intracatheter Q12H   thiamine   100 mg Oral Daily   valbenazine   40 mg Oral Daily      Subjective:   Ernest Romero was seen and examined today.  Sitter at the bedside patient alert, oriented and frustrated with jaw tremors (states he cannot take it anymore).  Tube feeds ongoing.  No acute nausea vomiting, chest pain, shortness of breath or any fevers.   Objective:   Vitals:   09/05/24 2010 09/06/24 0443 09/06/24 0500 09/06/24 0750  BP: 96/65 134/88  (!) 129/100  Pulse: (!) 51 (!) 48  (!) 52  Resp: 16 16  20   Temp: 97.7 F (36.5 C) 98 F (36.7 C)  99 F (37.2 C)  TempSrc: Oral Oral    SpO2: 94% 97%  95%  Weight:   60.8 kg   Height:        Intake/Output Summary (Last 24 hours) at 09/06/2024 1104 Last data filed at 09/06/2024 1056 Gross per 24 hour  Intake 700 ml  Output 1200 ml  Net -500 ml     Wt Readings from Last 3 Encounters:  09/06/24 60.8 kg  07/15/24 67.5 kg  06/16/24 78.9 kg   Physical Exam General: Alert and oriented, upset Cardiovascular: S1 S2 clear, RRR.  Respiratory: CTAB, no wheezing Gastrointestinal: Soft, nontender, nondistended, NBS Ext: no pedal edema bilaterally Neuro: jaw tremor noted, Psych: Frustrated with jaw tremors    Data Reviewed:  I have personally reviewed following labs    CBC Lab Results  Component Value Date   WBC 3.9 (L) 09/03/2024   RBC 3.99 (L) 09/03/2024   HGB 11.7 (L) 09/03/2024   HCT 35.7 (L) 09/03/2024   MCV 89.5 09/03/2024   MCH 29.3 09/03/2024   PLT 239 09/03/2024   MCHC 32.8 09/03/2024   RDW 12.7 09/03/2024   LYMPHSABS 2.3 07/16/2024   MONOABS 0.7 07/16/2024   EOSABS 0.2 07/16/2024   BASOSABS 0.1 07/16/2024     Last metabolic panel Lab Results  Component Value Date   NA 143 09/05/2024   K 4.3  09/05/2024   CL 110 09/05/2024   CO2 22 09/05/2024   BUN 18 09/05/2024   CREATININE 1.03 09/05/2024   GLUCOSE 79 09/05/2024   GFRNONAA >60 09/05/2024   GFRAA >60 09/16/2018   CALCIUM 9.1 09/05/2024   PHOS 3.9 08/26/2024   PROT 6.1 (L) 08/28/2024   ALBUMIN  3.1 (L) 08/28/2024   BILITOT 0.7 08/28/2024   ALKPHOS 69 08/28/2024   AST 26 08/28/2024   ALT 40 08/28/2024   ANIONGAP 11 09/05/2024    CBG (last 3)  Recent Labs    09/05/24 2317 09/06/24 0554 09/06/24 0750  GLUCAP 128* 119* 122*      Coagulation Profile: No results for input(s): INR, PROTIME in the last 168 hours.   Radiology Studies: I have personally reviewed the imaging studies  DG Swallowing Func-Speech Pathology Result Date: 09/04/2024 Table formatting from the original  result was not included. Modified Barium Swallow Study Patient Details Name: Ernest Romero MRN: [id number] Date of Birth: 07/08/1966 Today's Date: 09/04/2024 HPI/PMH: HPI: Patient is a 58 y.o. male who was initially admitted to [facility] on 06/30/24 after an unresponsive episode in jail. He was found to have a large SDH. He underwent a left frontoparietal craniotomy with evacuation of SDH and drain placement 9/28 (drain removed 9/29). SLP was following patient at [facility] for cognitive-linguistic goals. He was on a regular solids, thin liquids diet prior to transfer to [facility] on 10/14 for further management of craniotomy and flap. On 10/27, he underwent cranioplasty with bone flap replacement. On 10/28 he had a change in cognitive status thought to be due to Klonopin  but repeat CTH showed significant increase in size of mixed attenuation subdural fluid collection underlying the left tempoparietal craniotomy site with associated mild mass effect and slight rightward midline shift. He underwent left burr hole for evacuation on 10/30. He underwent left bone flap removal on 11/10 after developing SDH s/p burr hole evacuation followed by swelling and suspected infection.  10/30 CXR without acute cardiopulmonary abnormality. SLP evaluated patient for swallow function at bedside on 10/28 recommending NPO due to change in mentation but diet initiated on 10/31 following PO trials with patient's alertness improving and a dysphagia 3 (mechanical soft) solids, thin liquids diet was recommended. RN requested SLP reassess patient's swallowing on 11/18 due to concerns of coughing when drinking liquids. Patient reassessed at bedside and continuation of Dys 3, thin liquids diet recommended with careful monitoring. SLP swallow evaluation reordered on 11/28 due to RN observing patient to cough when taking medications leading to MD changing patient to NPO awaiting SLP reassessment. MBS completed on 11/29 which reported severe oropharyngeal dysphagia with aspiration of all liquids and puree solids. SLP recommended NPO status awaiting repeat chest imaging. MD and SLP in agreement to initiate PO diet of full liquids/thin consistency after repeat chest imaging indicating patient's lungs were clear. Patient reportedly continued to struggle with PO diet and decision was made for NPO status but allowing ice chips and water  sips PRN. Repeat CTH on 12/1 reported: Evolving postoperative changes from left sided hemicraniectomy and bone flap removal without evidence of an acute intracranial abnormality or significant residual subdural collection. MD reduced patient's Clonazepam  dose in hopes of improving his alertness. Clinical Impression: Recs: NPO except necessary meds crushed in puree, Cortrak for nutrition, therapeutic trials of honey thick liquids and puree solids with SLP As etiology of this acute change in his swallow function is unknown, we will need to revisit next week to determine if there has been any functional change in his swallow or if longer term artificial means of nutrition would be needed. In addition, further conversations may need to be had with patient and/or POA regarding GOC. Clinical  Impression: Patient's swallow function appears similar to MBS completed on 08/31/24 and he continues with silent aspiration of gross amounts. Chin tuck posture was beneficial with honey thick liquids, resulting in full epiglottic inversion and complete laryngeal vestibule closure as well as clearance of majority of bolus and no observed increase in vallecular or pyriform sinus residuals which were present from previous swallows. DIGEST Swallow Severity Rating*  Safety: 4  Efficiency: 3  Overall Pharyngeal Swallow Severity: 4 1: mild; 2: moderate; 3: severe; 4: profound *The Dynamic Imaging Grade of Swallowing Toxicity is standardized for the head and neck cancer population, however, demonstrates promising clinical applications across populations to standardize the clinical rating of  pharyngeal swallow safety and severity. Factors that may increase risk of adverse event in presence of aspiration Noe & Lianne 2021): Factors that may increase risk of adverse event in presence of aspiration Noe & Lianne 2021): Aspiration of thick, dense, and/or acidic materials; Frequent aspiration of large volumes; Reduced cognitive function; Frail or deconditioned Recommendations/Plan: Swallowing Evaluation Recommendations Swallowing Evaluation Recommendations Recommendations: NPO except meds Medication Administration: Crushed with puree Supervision: Full supervision/cueing for swallowing strategies Swallowing strategies  : Slow rate; Small bites/sips Postural changes: Position pt fully upright for meals Oral care recommendations: Oral care BID (2x/day) Treatment Plan Treatment Plan Treatment recommendations: Therapy as outlined in treatment plan below Follow-up recommendations: Skilled nursing-short term rehab (<3 hours/day) Functional status assessment: Patient has had a recent decline in their functional status and demonstrates the ability to make significant improvements in function in a reasonable and predictable amount  of time. Treatment frequency: Min 4x/week Treatment duration: 2 weeks Interventions: Aspiration precaution training; Diet toleration management by SLP; Trials of upgraded texture/liquids Recommendations Recommendations for follow up therapy are one component of a multi-disciplinary discharge planning process, led by the attending physician.  Recommendations may be updated based on patient status, additional functional criteria and insurance authorization. Assessment: Orofacial Exam: Orofacial Exam Oral Cavity: Oral Hygiene: Xerostomia Oral Cavity - Dentition: Poor condition; Missing dentition Orofacial Anatomy: WFL Anatomy: Anatomy: WFL Boluses Administered: Boluses Administered Boluses Administered: Moderately thick liquids (Level 3, honey thick); Puree; Thin liquids (Level 0)  Oral Impairment Domain: Oral Impairment Domain Lip Closure: Escape beyond mid-chin Tongue control during bolus hold: Not tested Bolus transport/lingual motion: Repetitive/disorganized tongue motion; Slow tongue motion Oral residue: Residue collection on oral structures Location of oral residue : Tongue; Palate Initiation of pharyngeal swallow : Valleculae  Pharyngeal Impairment Domain: Pharyngeal Impairment Domain Soft palate elevation: No bolus between soft palate (SP)/pharyngeal wall (PW) Laryngeal elevation: Minimal superior movement of thyroid cartilage with minimal approximation of arytenoids to epiglottic petiole Anterior hyoid excursion: Complete anterior movement Laryngeal vestibule closure: Incomplete, narrow column air/contrast in laryngeal vestibule Pharyngeal contraction (A/P view only): N/A Pharyngoesophageal segment opening: Minimal distention/minimal duration, marked obstruction of flow Tongue base retraction: Trace column of contrast or air between tongue base and PPW Pharyngeal residue: Collection of residue within or on pharyngeal structures Location of pharyngeal residue: Pyriform sinuses; Tongue base; Valleculae;  Pharyngeal wall; Diffuse (>3 areas)  Esophageal Impairment Domain: Esophageal Impairment Domain Esophageal clearance upright position: Complete clearance, esophageal coating Pill: No data recorded Penetration/Aspiration Scale Score: Penetration/Aspiration Scale Score 1.  Material does not enter airway: Puree 8.  Material enters airway, passes BELOW cords without attempt by patient to eject out (silent aspiration) : Thin liquids (Level 0); Moderately thick liquids (Level 3, honey thick) Compensatory Strategies: Compensatory Strategies Compensatory strategies: Yes Straw: Ineffective Ineffective Straw: Moderately thick liquid (Level 3, honey thick) Effortful swallow: Ineffective Ineffective Effortful Swallow: Thin liquid (Level 0) Chin tuck: Effective Effective Chin Tuck: Moderately thick liquid (Level 3, honey thick); Puree Left head turn: Ineffective Ineffective Left Head Turn: Thin liquid (Level 0) Right head turn: Ineffective Ineffective Right Head Turn: Thin liquid (Level 0)   General Information: No data recorded Diet Prior to this Study: NPO   Temperature : Normal   Respiratory Status: WFL   Supplemental O2: None (Room air)   History of Recent Intubation: Yes  Behavior/Cognition: Alert; Cooperative; Pleasant mood Self-Feeding Abilities: Needs assist with self-feeding Baseline vocal quality/speech: Normal Volitional Cough: Able to elicit Volitional Swallow: Able to elicit Exam Limitations: No limitations Goal  Planning: Prognosis for improved oropharyngeal function: Fair Barriers to Reach Goals: Severity of deficits No data recorded Patient/Family Stated Goal: patient wants his involuntary jaw movement to stop, he wants to be able to eat and drink Consulted and agree with results and recommendations: Patient Pain: Pain Assessment Pain Assessment: No/denies pain Faces Pain Scale: 0 Pain Location: jaw from involuntary lateral movement Pain Descriptors / Indicators: Discomfort Pain Intervention(s): Monitored during  session End of Session: Start Time:SLP Start Time (ACUTE ONLY): 1322 Stop Time: SLP Stop Time (ACUTE ONLY): 1350 Time Calculation:SLP Time Calculation (min) (ACUTE ONLY): 28 min Charges: SLP Evaluations $ SLP Speech Visit: 1 Visit SLP Evaluations $MBS Swallow: 1 Procedure $Swallowing Treatment: 1 Procedure SLP visit diagnosis: SLP Visit Diagnosis: Dysphagia, oropharyngeal phase (R13.12) Past Medical History: Past Medical History: Diagnosis Date  Anxiety   Bipolar 1 disorder (HCC)   Depression   Hepatitis   Hypertension   MRSA (methicillin resistant Staphylococcus aureus)   5/25 arm abscess and 11/25 Past Surgical History: Past Surgical History: Procedure Laterality Date  BURR HOLE N/A 08/01/2024  Procedure: CREATION, CRANIAL BURR HOLE WITH EVACUATION OF HEMATOMA;  Surgeon: Rosslyn Dino HERO, MD;  Location: [facility] OR;  Service: Neurosurgery;  Laterality: N/A;  BURR HOLE N/A 08/12/2024  Procedure: left bone flap removal;  Surgeon: Rosslyn Dino HERO, MD;  Location: [facility] OR;  Service: Neurosurgery;  Laterality: N/A;  CRANIOPLASTY, WITH BONE FLAP REPLACEMENT Left 07/29/2024  Procedure: CRANIOPLASTY, WITH BONE FLAP REPLACEMENT;  Surgeon: Rosslyn Dino HERO, MD;  Location: [facility] OR;  Service: Neurosurgery;  Laterality: Left;  LEFT CRANIOPLASTY WITH ARTIFICAL BONE FLAP REPLACEMENT  CRANIOTOMY Left 06/30/2024  Procedure: CRANIOTOMY HEMATOMA EVACUATION SUBDURAL;  Surgeon: Ernest Grass, MD;  Location: [facility] ORS;  Service: Neurosurgery;  Laterality: Left; Ernest IVAR Blase, Ernest Romero, Ernest Romero Speech Therapy 09/04/2024, 5:20 PM  DG Abd Portable 1V Result Date: 09/04/2024 EXAM: 1 VIEW XRAY OF THE ABDOMEN 09/04/2024 03:25:00 PM COMPARISON: 07/31/2024 CLINICAL HISTORY: Encounter for feeding tube placement [id number] FINDINGS: LINES, TUBES AND DEVICES: Weighted tip enteric tube in place with tip overlying distal descending duodenum. BOWEL: Nonobstructive bowel gas pattern. SOFT TISSUES: No opaque urinary calculi. BONES: No acute osseous abnormality.  IMPRESSION: 1. Weighted-tip enteric tube with the tip overlying the distal descending duodenum, appropriate position for post-pyloric feeding tube placement. Electronically signed by: Lonni Necessary MD 09/04/2024 04:28 PM EST RP Workstation: [id number]       Ernest Romero M.D. [facility] 09/06/2024, 11:04 AM  Available via Epic secure chat 7am-7pm After 7 pm, please refer to night coverage provider listed on amion.

## 2024-09-06 NOTE — Progress Notes (Incomplete)
 NEUROLOGY FOLLOW UP NOTE   Date of service: September 06, 2024 Patient Name: Ernest Romero MRN:  [id number] DOB:  [birth year] Reason for Consultation: Persistent jaw tremor Requesting Provider: Davia Nydia POUR, MD  History of Present Illness  Ernest Romero is a 58 y.o. homeless male with a PMHx significant for alcohol use disorder, bipolar and panic disorder, depression, hepatitis and HTN who initially presented to [facility] from jail in September 2025 with a large subdural hematoma. He underwent craniotomy with flap replacement on 10/27. Since then he has had multiple OR visits including 10/30 for SDH evacuation. Follow-up CT head 11/8 showed fluid collection underneath the bone flap, so he was sent back to the OR on 11/10 and then bone flap was removed, cultures were obtained intraoperatively growing MRSA. He is being treated for infected bone flap. ID is following and he is currently on IV vancomycin .   Per Psychiatry consult note from yesterday: Current differential includes drug-induced parkinsonism as most likely issue that is slowly responding to medications.  DIP is most consistent with the characteristics of tremor including the frequency of the tremor being slower and consistent, being present at rest, and resolving with patient focusing on stopping it or being distracted.  Similarly to this there is some concern patient might have rapid syndrome which should be treated similarly to DIP.  Due to concern for drug-induced parkinsonism continuing benztropine  1 mg twice daily. He has some mild improvement over last several days per patient report. Patient reports worsening mood symptoms of depression (7 out of 10) and anxiety (9 out of 10) due to ongoing perioral movements.  Given concern for tardive dyskinesia titrating valbenazine , been on 80 mg x3 days with some improvement and will continue for now.  Regard would continue utilizing the benzodiazepines, propranolol , and mirtazapine . Will decrease from  Klonipin 2 mg to 1 mg TID due to oversedation, lethargy and difficulty with swallowing. Patient has had several scheduled doses held over the last 2 days and he tolerated 1 mg TID dosing better with less sedation effects.   Neurology has been consulted for further input regarding the patient's jaw tremor.    ROS  Comprehensive ROS performed and pertinent positives documented in HPI   Past History   Past Medical History:  Diagnosis Date   Anxiety    Bipolar 1 disorder (HCC)    Depression    Hepatitis    Hypertension    MRSA (methicillin resistant Staphylococcus aureus)    5/25 arm abscess and 11/25    Past Surgical History:  Procedure Laterality Date   BURR HOLE N/A 08/01/2024   Procedure: CREATION, CRANIAL BURR HOLE WITH EVACUATION OF HEMATOMA;  Surgeon: Rosslyn Dino HERO, MD;  Location: [facility] OR;  Service: Neurosurgery;  Laterality: N/A;   BURR HOLE N/A 08/12/2024   Procedure: left bone flap removal;  Surgeon: Rosslyn Dino HERO, MD;  Location: [facility] OR;  Service: Neurosurgery;  Laterality: N/A;   CRANIOPLASTY, WITH BONE FLAP REPLACEMENT Left 07/29/2024   Procedure: CRANIOPLASTY, WITH BONE FLAP REPLACEMENT;  Surgeon: Rosslyn Dino HERO, MD;  Location: [facility] OR;  Service: Neurosurgery;  Laterality: Left;  LEFT CRANIOPLASTY WITH ARTIFICAL BONE FLAP REPLACEMENT   CRANIOTOMY Left 06/30/2024   Procedure: CRANIOTOMY HEMATOMA EVACUATION SUBDURAL;  Surgeon: Melonie Grass, MD;  Location: [facility] ORS;  Service: Neurosurgery;  Laterality: Left;    Family History: History reviewed. No pertinent family history.  Social History  reports that he has quit smoking. He has never used smokeless tobacco. He reports  that he does not currently use alcohol. He reports that he does not currently use drugs.  No Known Allergies  Medications   Current Facility-Administered Medications:    acetaminophen  (TYLENOL ) tablet 650 mg, 650 mg, Oral, Q4H PRN, 650 mg at 09/05/24 1803 **OR** [PENDING] acetaminophen   (TYLENOL ) tablet 650 mg, 650 mg, Per Tube, Q4H PRN **OR** acetaminophen  (TYLENOL ) suppository 650 mg, 650 mg, Rectal, Q4H PRN **OR** [PENDING] acetaminophen  (TYLENOL ) suppository 650 mg, 650 mg, Rectal, Q4H PRN,    Ampicillin -Sulbactam (UNASYN ) 3 g in sodium chloride  0.9 % 100 mL IVPB, 3 g, Intravenous, Q6H, Rai, Ripudeep K, MD, Last Rate: 200 mL/hr at 09/06/24 1531, 3 g at 09/06/24 1531   Chlorhexidine  Gluconate Cloth 2 % PADS 6 each, 6 each, Topical, Daily, Franky Redia SAILOR, MD, 6 each at 09/06/24 1002   clonazePAM  (KLONOPIN ) tablet 0.5 mg, 0.5 mg, Oral, BID, Starkes-Perry, Takia S, FNP, 0.5 mg at 09/06/24 0959   docusate (COLACE) 50 MG/5ML liquid 100 mg, 100 mg, Oral, Daily, Hongalgi, Anand D, MD, 100 mg at 09/05/24 1119   docusate sodium  (COLACE) capsule 100 mg, 100 mg, Oral, BID PRN, Autry, Lauren E, PA-C, 100 mg at 08/25/24 0847   feeding supplement (ENSURE PLUS HIGH PROTEIN) liquid 237 mL, 237 mL, Oral, BID BM, Danford, Lonni SQUIBB, MD, 237 mL at 09/02/24 0926   feeding supplement (OSMOLITE 1.5 CAL) liquid 1,000 mL, 1,000 mL, Per Tube, Continuous, Rai, Ripudeep K, MD, Last Rate: 60 mL/hr at 09/04/24 1835, Infusion Verify at 09/04/24 1835   feeding supplement (PROSource TF20) liquid 60 mL, 60 mL, Per Tube, Daily, Rai, Ripudeep K, MD, 60 mL at 09/06/24 0954   ferrous sulfate  tablet 325 mg, 325 mg, Oral, Q breakfast, Regalado, Belkys A, MD, 325 mg at 09/06/24 9041   folic acid  (FOLVITE ) tablet 1 mg, 1 mg, Oral, Daily, Rosario Eland I, MD, 1 mg at 09/06/24 9041   free water  100 mL, 100 mL, Per Tube, Q4H, Rai, Ripudeep K, MD, 100 mL at 09/06/24 1458   gabapentin  (NEURONTIN ) capsule 200 mg, 200 mg, Oral, TID, Danford, Lonni SQUIBB, MD, 200 mg at 09/06/24 1518   guaiFENesin -dextromethorphan  (ROBITUSSIN DM) 100-10 MG/5ML syrup 5 mL, 5 mL, Oral, Q4H PRN, Pudota, Ellouise SQUIBB, MD   heparin  injection 5,000 Units, 5,000 Units, Subcutaneous, Q8H, Desai, Rahul P, PA-C, 5,000 Units at 09/06/24 1521    labetalol  (NORMODYNE ) injection 10-40 mg, 10-40 mg, Intravenous, Q10 min PRN, Janjua, Rashid M, MD   lactated ringers  infusion, , Intravenous, Continuous, Regalado, Belkys A, MD, Last Rate: 75 mL/hr at 09/06/24 1143, New Bag at 09/06/24 1143   LORazepam  (ATIVAN ) injection 0.5 mg, 0.5 mg, Intravenous, Q6H PRN, Krishnan, Gokul, MD, 0.5 mg at 09/03/24 2356   magic mouthwash w/lidocaine , 5 mL, Oral, TID PRN, Regalado, Belkys A, MD, 5 mL at 09/03/24 1502   melatonin tablet 10 mg, 10 mg, Oral, QHS PRN, Shalhoub, George J, MD, 10 mg at 09/04/24 2145   mirtazapine  (REMERON ) tablet 15 mg, 15 mg, Oral, QHS, McCarty, Artie, MD, 15 mg at 09/04/24 2145   multivitamin with minerals tablet 1 tablet, 1 tablet, Oral, Daily, Rosario Eland I, MD, 1 tablet at 09/06/24 9041   nicotine  (NICODERM CQ  - dosed in mg/24 hours) patch 14 mg, 14 mg, Transdermal, Daily, Fredia Dorothe HERO, MD, 14 mg at 09/06/24 1004   nystatin  (MYCOSTATIN ) 100000 UNIT/ML suspension 500,000 Units, 5 mL, Oral, QID, Regalado, Belkys A, MD, 500,000 Units at 09/04/24 1000   ondansetron  (ZOFRAN ) tablet 4 mg, 4  mg, Oral, Q4H PRN **OR** ondansetron  (ZOFRAN ) injection 4 mg, 4 mg, Intravenous, Q4H PRN, Janjua, Rashid M, MD, 4 mg at 08/01/24 1506   Oral care mouth rinse, 15 mL, Mouth Rinse, PRN, Mannam, Praveen, MD   pantoprazole  (PROTONIX ) EC tablet 40 mg, 40 mg, Oral, QHS, Hongalgi, Anand D, MD, 40 mg at 09/01/24 2100   phenol (CHLORASEPTIC) mouth spray 1 spray, 1 spray, Mouth/Throat, PRN, Franky Redia SAILOR, MD, 1 spray at 09/03/24 0057   polyethylene glycol (MIRALAX  / GLYCOLAX ) packet 17 g, 17 g, Oral, Daily, Desai, Rahul P, PA-C, 17 g at 09/02/24 9078   promethazine  (PHENERGAN ) tablet 12.5-25 mg, 12.5-25 mg, Oral, Q4H PRN, Janjua, Rashid M, MD   propranolol  (INDERAL ) tablet 20 mg, 20 mg, Oral, BID, Regalado, Belkys A, MD, 20 mg at 09/05/24 1119   senna (SENOKOT) tablet 17.2 mg, 2 tablet, Oral, Daily, Desai, Rahul P, PA-C, 17.2 mg at 09/05/24 1119    sodium chloride  flush (NS) 0.9 % injection 10-40 mL, 10-40 mL, Intracatheter, Q12H, Franky Redia SAILOR, MD, 10 mL at 09/06/24 1006   sodium chloride  flush (NS) 0.9 % injection 10-40 mL, 10-40 mL, Intracatheter, PRN, Franky Redia SAILOR, MD, 30 mL at 07/29/24 1135   thiamine  (VITAMIN B1) tablet 100 mg, 100 mg, Oral, Daily, Hongalgi, Anand D, MD, 100 mg at 09/06/24 9041   valbenazine  (INGREZZA ) capsule 40 mg, 40 mg, Oral, Daily, Starkes-Perry, Majel RAMAN, FNP, 40 mg at 09/06/24 1000   vancomycin  (VANCOREADY) IVPB 750 mg/150 mL, 750 mg, Intravenous, Q12H, Regalado, Belkys A, MD, Last Rate: 150 mL/hr at 09/06/24 1150, 750 mg at 09/06/24 1150  Vitals   Vitals:   09/06/24 0750 09/06/24 1210 09/06/24 1309 09/06/24 1606  BP: (!) 129/100 137/88 (!) 138/105 (!) 156/100  Pulse: (!) 52 (!) 54 (!) 57 (!) 55  Resp: 20 17 18 17   Temp: 99 F (37.2 C) 98 F (36.7 C) 98 F (36.7 C) 98.3 F (36.8 C)  TempSrc:  Axillary Axillary Axillary  SpO2: 95% 100% 98% 96%  Weight:      Height:        Body mass index is 19.23 kg/m.   Physical Exam   Constitutional: No cachexia noted. Psych: Blunted affect Eyes: No scleral injection. Left eye ptotic in context of recent left craniotomy and craniectomy HENT: No OP obstruction.  Head: Left sided skull indentation secondary to left craniectomy. Sutures along left frontoparietal scalp above the skull indentation also noted.   Respiratory: Effort normal, non-labored breathing.  Skin: WDI.   Neurologic Examination   Mental Status: Awake and alert, but with increased latencies of verbal and motor responses. Oriented x 5. Speech slow and somewhat monotone but fluent, with intact naming and comprehension. Able to follow all commands without difficulty. Cranial Nerves: II: Temporal visual fields intact with no extinction to DSS. PERRL  III,IV, VI: Left sided ptosis in context of recent left craniotomy and craniectomy. EOMI. No nystagmus.   V - Sensory: Temp  sensation equal bilaterally.  V - Motor: Jaw tremor consisting of oscillatory lateral movements to left and right occur continuously.  Response to motor tricks:The movements briefly abate after patient clenches jaw tightly for 5 seconds. Opening and closing jaw repeatedly several times also transiently stops the dyskinetic lateral jaw movements, but only for a few seconds. Response to sensory tricks: Patting the jaw region or applying pressure to the angles of the mandible on both sides has no significant effect VII: Smile with subtle weakness on the left in  the context of recent prior skull surgery.  VIII: Hearing intact to voice IX,X: No hoarseness XI: Symmetric shoulder shrug XII: Midline tongue extension Motor: BUE 5/5 proximally and distally BLE 5/5 proximally and distally  No pronator drift.  Sensory: Temp and light touch intact throughout, bilaterally. No extinction to DSS.  Deep Tendon Reflexes: 2+ and symmetric bilateral upper and lower extremities Cerebellar: No ataxia with FNF bilaterally/. Bradykinesia is noted throughout.  Gait: Deferred  Labs/Imaging/Neurodiagnostic studies   CBC:  Recent Labs  Lab 06-Sep-2024 0445 09/03/24 0131  WBC 4.3 3.9*  HGB 12.4* 11.7*  HCT 38.0* 35.7*  MCV 89.6 89.5  PLT 262 239   Basic Metabolic Panel:  Lab Results  Component Value Date   NA 143 09/05/2024   K 4.3 09/05/2024   CO2 22 09/05/2024   GLUCOSE 79 09/05/2024   BUN 18 09/05/2024   CREATININE 1.03 09/05/2024   CALCIUM 9.1 09/05/2024   GFRNONAA >60 09/05/2024   GFRAA >60 09/16/2018   Lipid Panel: No results found for: LDLCALC HgbA1c: No results found for: HGBA1C Urine Drug Screen:     Component Value Date/Time   LABOPIA NONE DETECTED 06/30/2024 0905   LABOPIA NEGATIVE 06/16/2024 0924   COCAINSCRNUR POSITIVE (A) 06/30/2024 0905   LABBENZ POSITIVE (A) 06/30/2024 0905   LABBENZ NEGATIVE 06/16/2024 0924   AMPHETMU NONE DETECTED 06/30/2024 0905   AMPHETMU NEGATIVE  06/16/2024 0924   THCU POSITIVE (A) 06/30/2024 0905   THCU POSITIVE (A) 06/16/2024 0924   LABBARB NONE DETECTED 06/30/2024 0905   LABBARB NEGATIVE 06/16/2024 0924    Alcohol Level     Component Value Date/Time   ETH <15 06/30/2024 0833   INR  Lab Results  Component Value Date   INR 1.0 06/30/2024   APTT  Lab Results  Component Value Date   APTT 30 06/30/2024     ASSESSMENT  ELRIDGE STEMM is a 58 y.o. male with a PMHx significant for alcohol use disorder, bipolar and panic disorder, depression, hepatitis and HTN who initially presented to [facility] from jail in September 2025 with a large subdural hematoma. He underwent craniotomy with flap replacement on 10/27. Since then he has had multiple OR visits including 10/30 for SDH evacuation. Follow-up CT head 11/8 showed fluid collection underneath the bone flap, so he was sent back to the OR on 11/10 and then bone flap was removed, cultures were obtained intraoperatively growing MRSA. He is being treated for infected bone flap. ID is following and he is currently on IV vancomycin .  - Exam reveals jaw tremor consisting of oscillatory lateral movements to left and right that occur continuously, but abate with volitional movement such as when speaking or clamping jaw shut. He is also bradyphrenic with increased latencies of motor and verbal responses as well as slow speech and bradykinesia. No cogwheel tremor noted. No other dyskinesias except for the jaw tremor.  - Response of tremor to motor tricks: The jaw movements briefly abate after patient clenches jaw tightly for 5 seconds. Opening and closing jaw repeatedly several times also transiently stops the dyskinetic lateral jaw movements, but only for a few seconds.  - Response of tremor to sensory tricks: Patting the jaw region or applying pressure to the angles of the mandible on both sides has no significant effect - CT head (12/1):  Evolving postoperative changes from left sided hemicraniectomy  and bone flap removal without evidence of an acute intracranial abnormality or significant residual subdural collection. 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 Recent medication changes most likely triggered this. He states that it has slightly improved over the last few days, which may be due to the modification of his psychiatric medication regimen recommended by Psychiatry.   RECOMMENDATIONS  - Can use the motor tricks above for temporary alleviation.  - Continue to monitor for continued improvement with the medication changes recommended by Psychiatry.  - Neurohospitalist service will follow PRN. Please call if there are additional questions.  ______________________________________________________________________  I personally spent a total of 60 minutes in the care of the patient today including preparing to see the patient, getting/reviewing separately obtained history, performing a medically appropriate exam/evaluation, counseling and educating, documenting clinical information in the EHR, and independently interpreting results.   Signed, MERRIANNE Bailyn Spackman, MD [facility]  Neuro[facility]

## 2024-09-06 NOTE — Progress Notes (Signed)
 Pharmacy Antibiotic Note  Ernest Romero is a 58 y.o. 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 Dosing for CNS penetration with target troughs 15-20 mcg/ml. Per ID, plan 6 weeks of Vancomycin  (thru 09/23/24).  Changed Vancomycin  to  750 mg IV q12h for new calculated AUC of 450 and trough of 15 on 11/29  Vancomycin  trough today 15 (therapeutic range)  Plan: Continue  Vancomycin  750 mg IV q12h  Follow Scr Weekly levels  Height: 5' 10 (177.8 cm) Weight: 60.8 kg (134 lb 0.6 oz) IBW/kg (Calculated) : 73  Temp (24hrs), Avg:98 F (36.7 C), Min:97.5 F (36.4 C), Max:99 F (37.2 C)  Recent Labs  Lab 08/30/24 2243 09/01/24 0445 09/02/24 0347 09/03/24 0131 09/05/24 0419 09/06/24 1022  WBC  --  4.3  --  3.9*  --   --   CREATININE  --   --  1.00 1.08 1.03  --   VANCOTROUGH 20  --   --   --   --  15    Estimated Creatinine Clearance: 65.6 mL/min (by C-G formula based on SCr of 1.03 mg/dL).    No Known Allergies  Antimicrobials this admission: Vancomycin  11/10 > Ceftazidime  11/10 >11/11   Dose adjustments this admission: 11/11 VT = 9 (true trough 7.6) on 750mg  q12 >> incr to 1g q8 11/14 VT = 23 >> decr 750mg  q8 11/16 VT = 11 >> adj to 1250mg  q12 11/19 VT = 24 >> dec to 1 gm IV q12h 11/21 VT=  19 mcg/ml - continue same  11/29 V P/T 29/20>>dec 750 IV q12h 12/5 VT = 15 - > continue same  Microbiology results: 10/27 MRSA PCR: positive 11/10 bone flap (tissue) x 2: one moderate and one abundant MRSA, MIC to Vanc 1   Perri Olam Murray 09/06/2024 2:03 PM

## 2024-09-06 NOTE — Plan of Care (Signed)
  Problem: Education: Goal: Knowledge of General Education information will improve Description: Including pain rating scale, medication(s)/side effects and non-pharmacologic comfort measures 09/06/2024 1826 by Bradly Devere LABOR, RN Outcome: Progressing 09/06/2024 1758 by Bradly Devere LABOR, RN Outcome: Progressing   Problem: Health Behavior/Discharge Planning: Goal: Ability to manage health-related needs will improve 09/06/2024 1826 by Bradly Devere LABOR, RN Outcome: Progressing 09/06/2024 1758 by Bradly Devere LABOR, RN Outcome: Progressing   Problem: Clinical Measurements: Goal: Ability to maintain clinical measurements within normal limits will improve 09/06/2024 1826 by Bradly Devere LABOR, RN Outcome: Progressing 09/06/2024 1758 by Bradly Devere LABOR, RN Outcome: Progressing Goal: Will remain free from infection 09/06/2024 1826 by Bradly Devere LABOR, RN Outcome: Progressing 09/06/2024 1758 by Bradly Devere LABOR, RN Outcome: Progressing Goal: Diagnostic test results will improve 09/06/2024 1826 by Bradly Devere LABOR, RN Outcome: Progressing 09/06/2024 1758 by Bradly Devere LABOR, RN Outcome: Progressing Goal: Respiratory complications will improve 09/06/2024 1826 by Bradly Devere LABOR, RN Outcome: Progressing 09/06/2024 1758 by Bradly Devere LABOR, RN Outcome: Progressing Goal: Cardiovascular complication will be avoided 09/06/2024 1826 by Bradly Devere LABOR, RN Outcome: Progressing 09/06/2024 1758 by Bradly Devere LABOR, RN Outcome: Progressing   Problem: Activity: Goal: Risk for activity intolerance will decrease 09/06/2024 1826 by Bradly Devere LABOR, RN Outcome: Progressing 09/06/2024 1758 by Bradly Devere LABOR, RN Outcome: Progressing   Problem: Nutrition: Goal: Adequate nutrition will be maintained 09/06/2024 1826 by Bradly Devere LABOR, RN Outcome: Progressing 09/06/2024 1758 by Bradly Devere LABOR, RN Outcome: Progressing   Problem: Coping: Goal: Level of anxiety will decrease 09/06/2024 1826 by Bradly Devere LABOR, RN Outcome: Progressing 09/06/2024 1758 by Bradly Devere LABOR, RN Outcome: Progressing   Problem: Elimination: Goal: Will not experience complications related to bowel motility 09/06/2024 1826 by Bradly Devere LABOR, RN Outcome: Progressing 09/06/2024 1758 by Bradly Devere LABOR, RN Outcome: Progressing Goal: Will not experience complications related to urinary retention 09/06/2024 1826 by Bradly Devere LABOR, RN Outcome: Progressing 09/06/2024 1758 by Bradly Devere LABOR, RN Outcome: Progressing   Problem: Pain Managment: Goal: General experience of comfort will improve and/or be controlled 09/06/2024 1826 by Bradly Devere LABOR, RN Outcome: Progressing 09/06/2024 1758 by Bradly Devere LABOR, RN Outcome: Progressing   Problem: Safety: Goal: Ability to remain free from injury will improve 09/06/2024 1826 by Bradly Devere LABOR, RN Outcome: Progressing 09/06/2024 1758 by Bradly Devere LABOR, RN Outcome: Progressing   Problem: Skin Integrity: Goal: Risk for impaired skin integrity will decrease 09/06/2024 1826 by Bradly Devere LABOR, RN Outcome: Progressing 09/06/2024 1758 by Bradly Devere LABOR, RN Outcome: Progressing   Problem: Education: Goal: Knowledge of the prescribed therapeutic regimen will improve 09/06/2024 1826 by Bradly Devere LABOR, RN Outcome: Progressing 09/06/2024 1758 by Bradly Devere LABOR, RN Outcome: Progressing   Problem: Clinical Measurements: Goal: Usual level of consciousness will be regained or maintained. 09/06/2024 1826 by Bradly Devere LABOR, RN Outcome: Progressing 09/06/2024 1758 by Bradly Devere LABOR, RN Outcome: Progressing Goal: Neurologic status will improve 09/06/2024 1826 by Bradly Devere LABOR, RN Outcome: Progressing 09/06/2024 1758 by Bradly Devere LABOR, RN Outcome: Progressing Goal: Ability to maintain intracranial pressure will improve 09/06/2024 1826 by Bradly Devere LABOR, RN Outcome: Progressing 09/06/2024 1758 by Bradly Devere LABOR, RN Outcome: Progressing   Problem: Skin Integrity: Goal: Demonstration of wound healing without infection will improve 09/06/2024 1826 by Bradly Devere LABOR,  RN Outcome: Progressing 09/06/2024 1758 by Bradly Devere LABOR, RN Outcome: Progressing

## 2024-09-07 DIAGNOSIS — F10930 Alcohol use, unspecified with withdrawal, uncomplicated: Secondary | ICD-10-CM

## 2024-09-07 LAB — GLUCOSE, CAPILLARY
Glucose-Capillary: 100 mg/dL — ABNORMAL HIGH (ref 70–99)
Glucose-Capillary: 103 mg/dL — ABNORMAL HIGH (ref 70–99)
Glucose-Capillary: 104 mg/dL — ABNORMAL HIGH (ref 70–99)
Glucose-Capillary: 110 mg/dL — ABNORMAL HIGH (ref 70–99)
Glucose-Capillary: 125 mg/dL — ABNORMAL HIGH (ref 70–99)
Glucose-Capillary: 68 mg/dL — ABNORMAL LOW (ref 70–99)

## 2024-09-07 NOTE — Progress Notes (Signed)
 [facility]                                                                              Ernest Romero, is a 58 y.o. male, DOB - August 30, 1966, FMW:[id number] Admit date - 07/15/2024    Outpatient Primary MD for the patient is [name]  LOS - 54  days  No chief complaint on file.      Brief summary   58 year old homeless, medical history significant for alcohol use disorder, bipolar and panic disorder, initially presented to [facility] from jail in September 2025 with a large subdural hematoma.  Underwent craniotomy with flap replacement on 10/27.  Since then multiple OR visits including 10/30 for [facility] evacuation, follow-up CT head 11/8 showed fluid collection underneath the bone flap, back to the OR 11/10 and then bone flap was removed, cultures were obtained intraoperative growing MRSA, treating for infected bone flap, ID following and currently on IV vancomycin .   10/27 crani w/ flap replacement 10/29: off clevi, liberate SBP goal, Klonopin , lithium , Haldol  as needed discontinued.  Latuda  dose cut by half 10/30: repeat CT head with increased collection - going to OR this afternoon for evacuation 10/31: OR  for [facility] evacuation.  Developed arm twitching.  Klonopin  restarted at half dose 11/1: More awake.  No complaint 11/2 transferred out of ICU. 11/4 cortrak removed 11/8 CT head showed fluid collection under bone flap, decision made to remove bone flap 11/10 to OR for L bone flap removal 11/11 repeat CT head stable 11/29: notice to have worsening Dysphagia.  12/1 repeat CT head: stable.  12/3: Cortrack placed for nutrition, sutures removed by neurosurgery.  Tube feeds started. 12/4: Very sedated, psychiatry requested to readjust meds, reduced clonazepam  to0.5mg  BID, decreased valbenazine  to 40 mg daily 12/5: Noted oriented but jaw tremors worse.  Evaluated by neurology    Assessment & Plan    Subdural hematoma Status post initial craniotomy  9/28 (Dr. 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 - Followed by neurosurgery. -Evaluated by infectious disease who recommends 6 weeks of IV vancomycin , EOT 09/23/2024.  After completion of IV antibiotics he will need 8 weeks of doxycycline . - Will need PICC line placed closer to discharge date. - Completed 1 week of Keppra  -  sutures were removed without complication by neurosurgery on 12/3.  EVD sutures remain intact, no signs of drainage or erythema, recommended do not remove at this time    -Acute metabolic encephalopathy - At baseline, patient reportedly without cognitive impairment, was homeless and independent caring for self. - At some point, during this hospitalization patient was deemed not to have capacity, due to encephalopathy and neurological deficits. Per recent physicians notes patient appears to be able to make decision for himself moving forward. Continue to monitor, capacity could fluctuates.  - Now alert and oriented, anxious yesterday c/o increasing jaw tremor.  Patient was too oversedated before and psychiatry readjusted medications to reduce clonazepam  and valbenazine  - appreciate neurology, Dr Lindzen for evaluating and recommendations, psych will need to continue  following and make necessary adjustments to the regimen - place OT consult. Jaw tremors alleviates temporarily with distraction      Bipolar disorder/akathisia/drug-induced parkinsonism: - Patient with long standing history of bipolar disorder. - Patient was on Haldol  and Latuda , developed facial tremors 11/20 concern for tardive dyskinesia. - EEG negative for seizures. - Latuda  and Haldol  discontinued. - Patient was started on propranolol  and Klonopin .  On Ingrezza  -Due to oversedation, Klonopin  decreased to 0.5 mg twice daily and valbenazine  decreased to 40 mg daily on 12/4 -  appreciate neurology, Dr Lindzen  for evaluating and recommendations, psych will need to continue following and make necessary adjustments to the regimen    Dysphagia 11/29: Overnight after medications given with water  patient felt something got stuck, he require suctioning.  -evaluated by speech. MBS: sign of aspiration.   CT head. Stable. - Speech therapy following, placed cortrack on 12/3, tube feeds started - now alert and oriented, repeat SLP eval    Aspiration PNA;  Acute hypoxic Resp failure.  -In setting of dysphagia and aspiration   -Incentive spirometry.  Continue IV Unasyn  for 5 days -sats 97% on room air    history of alcohol, tobacco use disorder: - Continue thiamine , folic acid  and multivitamin Treated for alcohol withdrawal delirium with phenobarbital  taper at [facility]. Resolved   Essential hypertension: - BP stable  Amlodipine  and losartan  has been discontinued. Propranolol  dose decreased to avoid hypotension   Constipation: Continue laxatives.    Bradycardia - Holding parameters on for propranolol     Severe malnutrition: Continue ensure.  -Started on tube feeds   Iron deficiency anemia; started  iron supplement.    Oral Thrush; started nystatin    Protein calorie malnutrition Nutrition Problem: Severe Malnutrition Etiology: acute illness Signs/Symptoms: energy intake < or equal to 50% for > or equal to 5 days, severe muscle depletion, moderate fat depletion Interventions: Tube feeding  Estimated body mass index is 19.14 kg/m as calculated from the following:   Height as of this encounter: 5' 10 (1.778 m).   Weight as of this encounter: 60.5 kg.  Code Status: Full code DVT Prophylaxis:  heparin  injection 5,000 Units Start: 08/13/24 1400 SCDs Start: 08/12/24 1150 Place TED hose Start: 08/12/24 1150 SCDs Start: 08/01/24 1745 Place and maintain sequential compression device Start: 07/29/24 1819   Level of Care: Level of care: Med-Surg Family Communication:  Disposition Plan:       Remains inpatient appropriate: Awaiting placement   Procedures:    Consultants:   Psych ID CCM Neurosurgery   Antimicrobials:   Anti-infectives (From admission, onward)    Start     Dose/Rate Route Frequency Ordered Stop   09/02/24 1700  Ampicillin -Sulbactam (UNASYN ) 3 g in sodium chloride  0.9 % 100 mL IVPB        3 g 200 mL/hr over 30 Minutes Intravenous Every 6 hours 09/02/24 1611 09/07/24 1659   08/31/24 1000  vancomycin  (VANCOREADY) IVPB 750 mg/150 mL        750 mg 150 mL/hr over 60 Minutes Intravenous Every 12 hours 08/31/24 0015     08/21/24 0800  vancomycin  (VANCOCIN ) IVPB 1000 mg/200 mL premix  Status:  Discontinued        1,000 mg 200 mL/hr over 60 Minutes Intravenous Every 12 hours 08/21/24 0720 08/31/24 0015   08/19/24 0600  vancomycin  (VANCOREADY) IVPB 1250 mg/250 mL  Status:  Discontinued        1,250 mg 166.7 mL/hr over 90 Minutes Intravenous Every 12 hours 08/18/24 1818  08/21/24 0547   08/18/24 1830  vancomycin  (VANCOREADY) IVPB 500 mg/100 mL        500 mg 100 mL/hr over 60 Minutes Intravenous  Once 08/18/24 1817 08/19/24 1642   08/16/24 1600  vancomycin  (VANCOREADY) IVPB 750 mg/150 mL  Status:  Discontinued        750 mg 150 mL/hr over 60 Minutes Intravenous Every 8 hours 08/16/24 0828 08/18/24 1818   08/14/24 0800  vancomycin  (VANCOCIN ) IVPB 1000 mg/200 mL premix  Status:  Discontinued        1,000 mg 200 mL/hr over 60 Minutes Intravenous Every 8 hours 08/14/24 0016 08/16/24 0828   08/14/24 0030  vancomycin  (VANCOCIN ) IVPB 1000 mg/200 mL premix        1,000 mg 200 mL/hr over 60 Minutes Intravenous NOW 08/14/24 0016 08/14/24 0135   08/13/24 0100  vancomycin  (VANCOREADY) IVPB 750 mg/150 mL  Status:  Discontinued        750 mg 150 mL/hr over 60 Minutes Intravenous Every 12 hours 08/12/24 1230 08/14/24 0007   08/12/24 1400  ceFAZolin  (ANCEF ) IVPB 2g/100 mL premix  Status:  Discontinued        2 g 200 mL/hr over 30 Minutes Intravenous Every 8 hours 08/12/24  1149 08/12/24 1230   08/12/24 1400  cefTAZidime  (FORTAZ ) 2 g in sodium chloride  0.9 % 100 mL IVPB  Status:  Discontinued        2 g 200 mL/hr over 30 Minutes Intravenous Every 8 hours 08/12/24 1152 08/13/24 1109   08/12/24 1245  ceFAZolin  (ANCEF ) IVPB 2g/100 mL premix  Status:  Discontinued        2 g 200 mL/hr over 30 Minutes Intravenous  Once 08/12/24 1149 08/12/24 1449   08/12/24 1245  vancomycin  (VANCOREADY) IVPB 1250 mg/250 mL        1,250 mg 166.7 mL/hr over 90 Minutes Intravenous  Once 08/12/24 1152 08/12/24 1501   08/12/24 0957  vancomycin  (VANCOCIN ) powder  Status:  Discontinued          As needed 08/12/24 1008 08/12/24 1037   08/01/24 2200  ceFAZolin  (ANCEF ) IVPB 2g/100 mL premix        2 g 200 mL/hr over 30 Minutes Intravenous Every 8 hours 08/01/24 1703 08/02/24 1915   08/01/24 1530  vancomycin  (VANCOCIN ) powder  Status:  Discontinued          As needed 08/01/24 1559 08/01/24 1606   08/01/24 1421  ceFAZolin  (ANCEF ) 2-4 GM/100ML-% IVPB       Note to Pharmacy: Roslynn Birmingham: cabinet override      08/01/24 1421 08/02/24 0229   07/29/24 2200  ceFAZolin  (ANCEF ) IVPB 2g/100 mL premix        2 g 200 mL/hr over 30 Minutes Intravenous Every 8 hours 07/29/24 1859 07/30/24 1423   07/29/24 1654  vancomycin  (VANCOCIN ) powder  Status:  Discontinued          As needed 07/29/24 1655 07/29/24 1758   07/29/24 1515  ceFAZolin  (ANCEF ) IVPB 2g/100 mL premix        2 g 200 mL/hr over 30 Minutes Intravenous  Once 07/29/24 1421 07/29/24 1655          Medications  Chlorhexidine  Gluconate Cloth  6 each Topical Daily   clonazePAM   0.5 mg Oral BID   docusate  100 mg Oral Daily   feeding supplement  237 mL Oral BID BM   feeding supplement (PROSource TF20)  60 mL Per Tube Daily   ferrous sulfate   325 mg Oral Q breakfast   folic acid   1 mg Oral Daily   free water   100 mL Per Tube Q4H   gabapentin   200 mg Oral TID   heparin  injection (subcutaneous)  5,000 Units Subcutaneous Q8H   mirtazapine    15 mg Oral QHS   multivitamin with minerals  1 tablet Oral Daily   nicotine   14 mg Transdermal Daily   nystatin   5 mL Oral QID   pantoprazole   40 mg Oral QHS   polyethylene glycol  17 g Oral Daily   propranolol   20 mg Oral BID   senna  2 tablet Oral Daily   sodium chloride  flush  10-40 mL Intracatheter Q12H   thiamine   100 mg Oral Daily   valbenazine   40 mg Oral Daily      Subjective:   Vernel Langenderfer was seen and examined today.  Sitter at the bedside. Today calm and cooperative, less jaw tremor, No acute nausea vomiting, chest pain, shortness of breath or any fevers.   Objective:   Vitals:   09/06/24 2117 09/07/24 0032 09/07/24 0500 09/07/24 0829  BP: (!) 150/99 93/62  114/88  Pulse: 64 60  64  Resp:  16  18  Temp: (!) 97.3 F (36.3 C) (!) 97.4 F (36.3 C)  (!) 97.4 F (36.3 C)  TempSrc:  Oral  Oral  SpO2: 95% 95%  97%  Weight:   60.5 kg   Height:        Intake/Output Summary (Last 24 hours) at 09/07/2024 1051 Last data filed at 09/07/2024 0953 Gross per 24 hour  Intake 2585 ml  Output 3137 ml  Net -552 ml     Wt Readings from Last 3 Encounters:  09/07/24 60.5 kg  07/15/24 67.5 kg  06/16/24 78.9 kg   Physical Exam General: Alert and oriented, NAD Cardiovascular: S1 S2 clear, RRR.  Respiratory: CTAB, no wheezing, Gastrointestinal: Soft, nontender, nondistended, NBS Ext: no pedal edema bilaterally Neuro: no new deficits Psych: more calm and cooperative today     Data Reviewed:  I have personally reviewed following labs    CBC Lab Results  Component Value Date   WBC 3.9 (L) 09/03/2024   RBC 3.99 (L) 09/03/2024   HGB 11.7 (L) 09/03/2024   HCT 35.7 (L) 09/03/2024   MCV 89.5 09/03/2024   MCH 29.3 09/03/2024   PLT 239 09/03/2024   MCHC 32.8 09/03/2024   RDW 12.7 09/03/2024   LYMPHSABS 2.3 07/16/2024   MONOABS 0.7 07/16/2024   EOSABS 0.2 07/16/2024   BASOSABS 0.1 07/16/2024     Last metabolic panel Lab Results  Component Value Date   NA 143  09/05/2024   K 4.3 09/05/2024   CL 110 09/05/2024   CO2 22 09/05/2024   BUN 18 09/05/2024   CREATININE 1.03 09/05/2024   GLUCOSE 79 09/05/2024   GFRNONAA >60 09/05/2024   GFRAA >60 09/16/2018   CALCIUM 9.1 09/05/2024   PHOS 3.9 08/26/2024   PROT 6.1 (L) 08/28/2024   ALBUMIN  3.1 (L) 08/28/2024   BILITOT 0.7 08/28/2024   ALKPHOS 69 08/28/2024   AST 26 08/28/2024   ALT 40 08/28/2024   ANIONGAP 11 09/05/2024    CBG (last 3)  Recent Labs    09/07/24 0027 09/07/24 0619 09/07/24 0831  GLUCAP 125* 110* 103*      Coagulation Profile: No results for input(s): INR, PROTIME in the last 168 hours.   Radiology Studies: I have personally reviewed the imaging studies  No results  found.      Nydia Distance M.D. [facility] 09/07/2024, 10:51 AM  Available via Epic secure chat 7am-7pm After 7 pm, please refer to night coverage provider listed on amion.

## 2024-09-07 NOTE — Plan of Care (Signed)
  Problem: Education: Goal: Knowledge of General Education information will improve Description: Including pain rating scale, medication(s)/side effects and non-pharmacologic comfort measures Outcome: Progressing   Problem: Health Behavior/Discharge Planning: Goal: Ability to manage health-related needs will improve Outcome: Progressing   Problem: Clinical Measurements: Goal: Ability to maintain clinical measurements within normal limits will improve Outcome: Progressing Goal: Will remain free from infection Outcome: Progressing Goal: Diagnostic test results will improve Outcome: Progressing Goal: Respiratory complications will improve Outcome: Progressing Goal: Cardiovascular complication will be avoided Outcome: Progressing   Problem: Activity: Goal: Risk for activity intolerance will decrease Outcome: Progressing   Problem: Nutrition: Goal: Adequate nutrition will be maintained Outcome: Progressing   Problem: Coping: Goal: Level of anxiety will decrease Outcome: Progressing   Problem: Elimination: Goal: Will not experience complications related to bowel motility Outcome: Progressing Goal: Will not experience complications related to urinary retention Outcome: Progressing   Problem: Pain Managment: Goal: General experience of comfort will improve and/or be controlled Outcome: Progressing   Problem: Safety: Goal: Ability to remain free from injury will improve Outcome: Progressing   Problem: Skin Integrity: Goal: Risk for impaired skin integrity will decrease Outcome: Progressing   Problem: Education: Goal: Knowledge of the prescribed therapeutic regimen will improve Outcome: Progressing   Problem: Clinical Measurements: Goal: Usual level of consciousness will be regained or maintained. Outcome: Progressing Goal: Neurologic status will improve Outcome: Progressing Goal: Ability to maintain intracranial pressure will improve Outcome: Progressing    Problem: Skin Integrity: Goal: Demonstration of wound healing without infection will improve Outcome: Progressing

## 2024-09-07 NOTE — Progress Notes (Signed)
 Patient is trying to remove his NGT despite non-pharmacologic and pharmacologic interventions. Plan to use soft restraints for now.

## 2024-09-08 DIAGNOSIS — F3176 Bipolar disorder, in full remission, most recent episode depressed: Secondary | ICD-10-CM

## 2024-09-08 LAB — GLUCOSE, CAPILLARY
Glucose-Capillary: 119 mg/dL — ABNORMAL HIGH (ref 70–99)
Glucose-Capillary: 106 mg/dL — ABNORMAL HIGH (ref 70–99)
Glucose-Capillary: 102 mg/dL — ABNORMAL HIGH (ref 70–99)
Glucose-Capillary: 99 mg/dL (ref 70–99)
Glucose-Capillary: 139 mg/dL — ABNORMAL HIGH (ref 70–99)

## 2024-09-08 LAB — BASIC METABOLIC PANEL WITH GFR
Anion gap: 10 (ref 5–15)
BUN: 13 mg/dL (ref 6–20)
CO2: 22 mmol/L (ref 22–32)
Calcium: 8.8 mg/dL — ABNORMAL LOW (ref 8.9–10.3)
Chloride: 110 mmol/L (ref 98–111)
Creatinine, Ser: 0.79 mg/dL (ref 0.61–1.24)
GFR, Estimated: >60 mL/min (ref >60)
Glucose, Bld: 111 mg/dL — ABNORMAL HIGH (ref 70–99)
Potassium: 4 mmol/L (ref 3.5–5.1)
Sodium: 142 mmol/L (ref 135–145)

## 2024-09-08 LAB — CBC
HCT: 35 % — ABNORMAL LOW (ref 39.0–52.0)
Hemoglobin: 11.5 g/dL — ABNORMAL LOW (ref 13.0–17.0)
MCH: 29.3 pg (ref 26.0–34.0)
MCHC: 32.9 g/dL (ref 30.0–36.0)
MCV: 89.3 fL (ref 80.0–100.0)
Platelets: 195 K/uL (ref 150–400)
RBC: 3.92 MIL/uL — ABNORMAL LOW (ref 4.22–5.81)
RDW: 13.2 % (ref 11.5–15.5)
WBC: 2.8 K/uL — ABNORMAL LOW (ref 4.0–10.5)
nRBC: 0 % (ref 0.0–0.2)

## 2024-09-08 MED ORDER — CLONAZEPAM 0.5 MG PO TABS
0.5000 mg | ORAL_TABLET | Freq: Three times a day (TID) | ORAL | Status: AC
  Administered 2024-09-08 – 2024-11-08 (×186): 0.5 mg via ORAL
  Filled 2024-09-08 (×167): qty 1

## 2024-09-08 NOTE — Progress Notes (Signed)
 [facility]                                                                              Ernest Romero, is a 58 y.o. male, DOB - [birth year], FMW:[id number] Admit date - 07/15/2024    Outpatient Primary MD for the patient is [facility]  LOS - 55  days  No chief complaint on file.      Brief summary   58 year old homeless, medical history significant for alcohol use disorder, bipolar and panic disorder, initially presented to [facility] from jail in September 2025 with a large subdural hematoma.  Underwent craniotomy with flap replacement on 10/27.  Since then multiple OR visits including 10/30 for [facility] evacuation, follow-up CT head 11/8 showed fluid collection underneath the bone flap, back to the OR 11/10 and then bone flap was removed, cultures were obtained intraoperative growing MRSA, treating for infected bone flap, ID following and currently on IV vancomycin .   10/27 crani w/ flap replacement 10/29: off clevi, liberate SBP goal, Klonopin , lithium , Haldol  as needed discontinued.  Latuda  dose cut by half 10/30: repeat CT head with increased collection - going to OR this afternoon for evacuation 10/31: OR  for [facility] evacuation.  Developed arm twitching.  Klonopin  restarted at half dose 11/1: More awake.  No complaint 11/2 transferred out of ICU. 11/4 cortrak removed 11/8 CT head showed fluid collection under bone flap, decision made to remove bone flap 11/10 to OR for L bone flap removal 11/11 repeat CT head stable 11/29: notice to have worsening Dysphagia.  12/1 repeat CT head: stable.  12/3: Cortrack placed for nutrition, sutures removed by neurosurgery.  Tube feeds started. 12/4: Very sedated, psychiatry requested to readjust meds, reduced clonazepam  to0.5mg  BID, decreased valbenazine  to 40 mg daily 12/5: Noted oriented but jaw tremors worse.  Evaluated by neurology    Assessment & Plan    Subdural hematoma Status post initial craniotomy  9/28 (Dr. 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 - Followed by neurosurgery. -Evaluated by infectious disease who recommends 6 weeks of IV vancomycin , EOT 09/23/2024.  After completion of IV antibiotics he will need 8 weeks of doxycycline . - Will need PICC line placed closer to discharge date. - Completed 1 week of Keppra  -  sutures were removed without complication by neurosurgery on 12/3.  EVD sutures remain intact, no signs of drainage or erythema, recommended do not remove at this time    -Acute metabolic encephalopathy - At baseline, patient reportedly without cognitive impairment, was homeless and independent caring for self. - At some point, during this hospitalization patient was deemed not to have capacity, due to encephalopathy and neurological deficits. Per recent physicians notes patient appears to be able to make decision for himself moving forward. Continue to monitor, capacity could fluctuates.  -  Patient was too oversedated before and psychiatry readjusted medications to reduce clonazepam  and valbenazine .  Subsequently became alert, agitated with worsening jaw tremor - appreciate neurology, Dr Lindzen for evaluating and recommendations, psych will need to continue following  and make necessary adjustments to the regimen - Placed OT consult. Jaw tremors alleviates temporarily with distraction   - Overnight events noted, currently in soft restraints.  Increased Klonopin  to 0.5 mg 3 times daily    Bipolar disorder/akathisia/drug-induced parkinsonism: - Patient with long standing history of bipolar disorder. - Patient was on Haldol  and Latuda , developed facial tremors 11/20 concern for tardive dyskinesia. - EEG negative for seizures. - Latuda  and Haldol  discontinued. - Patient was started on propranolol  and Klonopin .  On Ingrezza  -Due to oversedation, Klonopin  decreased to 0.5 mg  twice daily and valbenazine  decreased to 40 mg daily on 12/4 -  appreciate neurology, Dr Lindzen for evaluating and recommendations, psych will need to continue following and make necessary adjustments to the regimen    Dysphagia 11/29: Overnight after medications given with water  patient felt something got stuck, he require suctioning.  -evaluated by speech. MBS: sign of aspiration.   CT head. Stable. - Speech therapy following, placed cortrack on 12/3, tube feeds started - Repeat SLP evaluation when more cooperative   Aspiration PNA;  Acute hypoxic Resp failure.  -In setting of dysphagia and aspiration   - Completed 5 days of IV Unasyn    history of alcohol, tobacco use disorder: - Continue thiamine , folic acid  and multivitamin Treated for alcohol withdrawal delirium with phenobarbital  taper at [facility]. Resolved   Essential hypertension: - BP stable  Amlodipine  and losartan  has been discontinued. Propranolol  dose decreased to avoid hypotension   Constipation: Continue laxatives.    Bradycardia - Holding parameters on for propranolol     Severe malnutrition: Continue ensure.  -Started on tube feeds   Iron deficiency anemia; started  iron supplement.    Oral Thrush; started nystatin    Protein calorie malnutrition Nutrition Problem: Severe Malnutrition Etiology: acute illness Signs/Symptoms: energy intake < or equal to 50% for > or equal to 5 days, severe muscle depletion, moderate fat depletion Interventions: Tube feeding  Estimated body mass index is 19.77 kg/m as calculated from the following:   Height as of this encounter: 5' 10 (1.778 m).   Weight as of this encounter: 62.5 kg.  Code Status: Full code DVT Prophylaxis:  heparin  injection 5,000 Units Start: 08/13/24 1400 SCDs Start: 08/12/24 1150 Place TED hose Start: 08/12/24 1150 SCDs Start: 08/01/24 1745 Place and maintain sequential compression device Start: 07/29/24 1819   Level of Care: Level of care:  Med-Surg Family Communication:  Disposition Plan:      Remains inpatient appropriate: Awaiting placement   Procedures:    Consultants:   Psych ID CCM Neurosurgery   Antimicrobials:   Anti-infectives (From admission, onward)    Start     Dose/Rate Route Frequency Ordered Stop   09/02/24 1700  Ampicillin -Sulbactam (UNASYN ) 3 g in sodium chloride  0.9 % 100 mL IVPB        3 g 200 mL/hr over 30 Minutes Intravenous Every 6 hours 09/02/24 1611 09/07/24 1230   08/31/24 1000  vancomycin  (VANCOREADY) IVPB 750 mg/150 mL        750 mg 150 mL/hr over 60 Minutes Intravenous Every 12 hours 08/31/24 0015     08/21/24 0800  vancomycin  (VANCOCIN ) IVPB 1000 mg/200 mL premix  Status:  Discontinued        1,000 mg 200 mL/hr over 60 Minutes Intravenous Every 12 hours 08/21/24 0720 08/31/24 0015   08/19/24 0600  vancomycin  (VANCOREADY) IVPB 1250 mg/250 mL  Status:  Discontinued        1,250 mg 166.7 mL/hr over 90 Minutes  Intravenous Every 12 hours 08/18/24 1818 08/21/24 0547   08/18/24 1830  vancomycin  (VANCOREADY) IVPB 500 mg/100 mL        500 mg 100 mL/hr over 60 Minutes Intravenous  Once 08/18/24 1817 08/19/24 1642   08/16/24 1600  vancomycin  (VANCOREADY) IVPB 750 mg/150 mL  Status:  Discontinued        750 mg 150 mL/hr over 60 Minutes Intravenous Every 8 hours 08/16/24 0828 08/18/24 1818   08/14/24 0800  vancomycin  (VANCOCIN ) IVPB 1000 mg/200 mL premix  Status:  Discontinued        1,000 mg 200 mL/hr over 60 Minutes Intravenous Every 8 hours 08/14/24 0016 08/16/24 0828   08/14/24 0030  vancomycin  (VANCOCIN ) IVPB 1000 mg/200 mL premix        1,000 mg 200 mL/hr over 60 Minutes Intravenous NOW 08/14/24 0016 08/14/24 0135   08/13/24 0100  vancomycin  (VANCOREADY) IVPB 750 mg/150 mL  Status:  Discontinued        750 mg 150 mL/hr over 60 Minutes Intravenous Every 12 hours 08/12/24 1230 08/14/24 0007   08/12/24 1400  ceFAZolin  (ANCEF ) IVPB 2g/100 mL premix  Status:  Discontinued        2 g 200  mL/hr over 30 Minutes Intravenous Every 8 hours 08/12/24 1149 08/12/24 1230   08/12/24 1400  cefTAZidime  (FORTAZ ) 2 g in sodium chloride  0.9 % 100 mL IVPB  Status:  Discontinued        2 g 200 mL/hr over 30 Minutes Intravenous Every 8 hours 08/12/24 1152 08/13/24 1109   08/12/24 1245  ceFAZolin  (ANCEF ) IVPB 2g/100 mL premix  Status:  Discontinued        2 g 200 mL/hr over 30 Minutes Intravenous  Once 08/12/24 1149 08/12/24 1449   08/12/24 1245  vancomycin  (VANCOREADY) IVPB 1250 mg/250 mL        1,250 mg 166.7 mL/hr over 90 Minutes Intravenous  Once 08/12/24 1152 08/12/24 1501   08/12/24 0957  vancomycin  (VANCOCIN ) powder  Status:  Discontinued          As needed 08/12/24 1008 08/12/24 1037   08/01/24 2200  ceFAZolin  (ANCEF ) IVPB 2g/100 mL premix        2 g 200 mL/hr over 30 Minutes Intravenous Every 8 hours 08/01/24 1703 08/02/24 1915   08/01/24 1530  vancomycin  (VANCOCIN ) powder  Status:  Discontinued          As needed 08/01/24 1559 08/01/24 1606   08/01/24 1421  ceFAZolin  (ANCEF ) 2-4 GM/100ML-% IVPB       Note to Pharmacy: Roslynn Birmingham: cabinet override      08/01/24 1421 08/02/24 0229   07/29/24 2200  ceFAZolin  (ANCEF ) IVPB 2g/100 mL premix        2 g 200 mL/hr over 30 Minutes Intravenous Every 8 hours 07/29/24 1859 07/30/24 1423   07/29/24 1654  vancomycin  (VANCOCIN ) powder  Status:  Discontinued          As needed 07/29/24 1655 07/29/24 1758   07/29/24 1515  ceFAZolin  (ANCEF ) IVPB 2g/100 mL premix        2 g 200 mL/hr over 30 Minutes Intravenous  Once 07/29/24 1421 07/29/24 1655          Medications  Chlorhexidine  Gluconate Cloth  6 each Topical Daily   clonazePAM   0.5 mg Oral TID   docusate  100 mg Oral Daily   feeding supplement  237 mL Oral BID BM   feeding supplement (PROSource TF20)  60 mL Per Tube  Daily   ferrous sulfate   325 mg Oral Q breakfast   folic acid   1 mg Oral Daily   free water   100 mL Per Tube Q4H   gabapentin   200 mg Oral TID   heparin  injection  (subcutaneous)  5,000 Units Subcutaneous Q8H   mirtazapine   15 mg Oral QHS   multivitamin with minerals  1 tablet Oral Daily   nicotine   14 mg Transdermal Daily   nystatin   5 mL Oral QID   pantoprazole   40 mg Oral QHS   polyethylene glycol  17 g Oral Daily   propranolol   20 mg Oral BID   senna  2 tablet Oral Daily   sodium chloride  flush  10-40 mL Intracatheter Q12H   thiamine   100 mg Oral Daily   valbenazine   40 mg Oral Daily      Subjective:   Dick Hark was seen and examined today.  Overnight events noted, trying to remove his core track, soft restraints placed.  This morning alert and awake, in restraints.  No acute nausea vomiting, chest pain, shortness of breath.  Jaw tremor better.   Objective:   Vitals:   09/08/24 0447 09/08/24 0606 09/08/24 0735 09/08/24 0940  BP:  109/84 110/77 (!) 126/94  Pulse:  (!) 55 (!) 52 61  Resp:  18 20 18   Temp:   (!) 97.4 F (36.3 C) 97.6 F (36.4 C)  TempSrc:    Oral  SpO2:   96% 97%  Weight: 62.5 kg     Height:        Intake/Output Summary (Last 24 hours) at 09/08/2024 0950 Last data filed at 09/08/2024 0800 Gross per 24 hour  Intake 620 ml  Output 1250 ml  Net -630 ml     Wt Readings from Last 3 Encounters:  09/08/24 62.5 kg  07/15/24 67.5 kg  06/16/24 78.9 kg   Physical Exam General: Alert and oriented x 3, still in restraints. Cardiovascular: S1 S2 clear, RRR.  Respiratory: CTAB Gastrointestinal: Soft, nontender, nondistended, NBS Ext: no pedal edema bilaterally Neuro: In soft restraints Psych: Anxious    Data Reviewed:  I have personally reviewed following labs    CBC Lab Results  Component Value Date   WBC 2.8 (L) 09/08/2024   RBC 3.92 (L) 09/08/2024   HGB 11.5 (L) 09/08/2024   HCT 35.0 (L) 09/08/2024   MCV 89.3 09/08/2024   MCH 29.3 09/08/2024   PLT 195 09/08/2024   MCHC 32.9 09/08/2024   RDW 13.2 09/08/2024   LYMPHSABS 2.3 07/16/2024   MONOABS 0.7 07/16/2024   EOSABS 0.2 07/16/2024   BASOSABS  0.1 07/16/2024     Last metabolic panel Lab Results  Component Value Date   NA 142 09/08/2024   K 4.0 09/08/2024   CL 110 09/08/2024   CO2 22 09/08/2024   BUN 13 09/08/2024   CREATININE 0.79 09/08/2024   GLUCOSE 111 (H) 09/08/2024   GFRNONAA >60 09/08/2024   GFRAA >60 09/16/2018   CALCIUM 8.8 (L) 09/08/2024   PHOS 3.9 08/26/2024   PROT 6.1 (L) 08/28/2024   ALBUMIN  3.1 (L) 08/28/2024   BILITOT 0.7 08/28/2024   ALKPHOS 69 08/28/2024   AST 26 08/28/2024   ALT 40 08/28/2024   ANIONGAP 10 09/08/2024    CBG (last 3)  Recent Labs    09/07/24 1719 09/07/24 2142 09/08/24 0734  GLUCAP 68* 100* 119*      Coagulation Profile: No results for input(s): INR, PROTIME in the last 168 hours.  Radiology Studies: I have personally reviewed the imaging studies  No results found.      Nydia Distance M.D. [facility] 09/08/2024, 9:50 AM  Available via Epic secure chat 7am-7pm After 7 pm, please refer to night coverage provider listed on amion.

## 2024-09-08 NOTE — Consult Note (Signed)
 [facility] Psychiatric Consult Follow-up  Patient Name: [name]  MRN: [id number]  DOB: 12/06/1965  Consult Order details:  Orders (From admission, onward)     Start     Ordered   08/22/24 0842  IP CONSULT TO PSYCHIATRY       Ordering Provider: Verdene Purchase, MD  Provider:  (Not yet assigned)  Question Answer Comment  Location [facility]   Reason for Consult? patient with h/o bipolar d/o. was on lithium . here with subdural hematoma and MRSA. improving. some facial trmors. Need help with medication management. he was on lithium , lurasidone . requiring haldol  prn.  thanks      08/22/24 0842   08/02/24 1712  IP CONSULT TO PSYCHIATRY       Ordering Provider: Adolph Tinnie FORBES, PA-C  Provider:  (Not yet assigned)  Question Answer Comment  Location [facility]   Reason for Consult? bipolar      08/02/24 1711   07/24/24 1656  IP CONSULT TO PSYCHIATRY       Ordering Provider: Patsy Lenis, MD  Provider:  (Not yet assigned)  Question Answer Comment  Location [facility]   Reason for Consult? needing formal eval for capacity; may need guardianship given no family/support for discharge      07/24/24 1656   07/16/24 1412  IP CONSULT TO PSYCHIATRY       Ordering Provider: Dino Antu, MD  Provider:  (Not yet assigned)  Question Answer Comment  Location [facility]   Reason for Consult? 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  MDD, recurrent, severe, without psychotic  features 3.  Stimulant use disorder, in controlled setting Rule out traumatic brain injury from chronic subdural hematomas and neurosurgical procedures and complications Rule out bipolar disorder but likely previous manic episodes were meth induced per chart review  Assessment  Ernest Romero is a 58 y.o. male admitted: Medicallyfor 07/15/2024 11:15 PM for recurrent subdural hematoma. He carries the psychiatric diagnoses of bipolar disorder and alcohol use disorder and has a past medical history of subdural hematoma status postcraniotomy.   Current differential includes drug-induced parkinsonism as most likely issue that is slowly responding to medications.  DIP is most consistent with the characteristics of tremor including the frequency of the tremor being slower and consistent, being present at rest, and resolving with patient focusing on stopping it or being distracted.  Similarly to this there is some concern patient might have rapid syndrome which should be treated similarly to DIP.  Due to concern for drug-induced parkinsonism continuing benztropine  1 mg twice daily.   He has some mild improvement over last several days per patient report. Patient reports worsening mood symptoms of depression (7 out of 10) and anxiety (9 out of 10) due to ongoing perioral movements.  Given concern for tardive dyskinesia titrating valbenazine , been on 80 mg x3 days with some improvement and will continue for now.  Regard would continue utilizing the benzodiazepines, propranolol , and mirtazapine . Will decrease from Klonipin 2 mg to 1 mg  TID due to oversedation, lethargy and difficulty with swallowing. Patient has had several scheduled doses held over the last 2 days and he tolerated 1 mg TID dosing better with less sedation effects.   09/05/2024: Unable to assess patient is obtunded  09/08/24 The patient was sitting up in bed eating applesauce with Speech Therapy at this beside watching him.  She reported he  started having swallowing issues in the past week and the feeding tube had to be placed.  The client stated for his mood, It's going good, not able to expand on this.  This provider discussed his jaw movements with the speech therapist who stated it is continuing.  Considering this is the first time I have evaluated him, I have nothing to compare.  When asked the patient, he said, It's going good.  The positive is the patient is not obtunded, calm and cooperative, psych will continue to follow.  Diagnoses:  Active Hospital problems: Principal Problem:   Subdural hematoma (HCC) Active Problems:   Bipolar disorder (HCC)   Essential hypertension   Alcohol withdrawal (HCC)   Unable to make decisions about medical treatment due to impaired mental capacity   Protein-calorie malnutrition, severe   Acute metabolic encephalopathy   Infection of craniotomy plate    Plan   ## Psychiatric Medication Recommendations:  Continue mirtazapine  15 mg once daily at bedtime for akathisia, insomnia, mood; monitor for mania switching but has tolerated >1 wk including benefit for jaw movement at night and sleep Continue propranolol  as can be helpful for akithisia if present, can help anxiety, and primary team can titrate for HTN Reduce clonazepam  0.5 mg two times daily for possible akathisia Ativan  0.5 PRN could potentially be d/c soon after 2 doses Decrease valbenazine  40 mg tardive dyskinesia; Patient with marked drowsiness since increasing the dose on 11/30 Consider pregabalin for TBI / anxiety in future   ## Medical Decision Making Capacity: Not specifically addressed in this encounter  ## Further Work-up:  -- TSH 1 month ago 0.775 While pt on Qtc prolonging medications, please monitor & replete K+ to 4 and Mg2+ to 2 -- most recent EKG on 11/19 had QtC of 414 -- Pertinent labwork reviewed earlier this admission includes: GFR greater than 60, LFT relatively within normal limits mildly elevated ALT,  hemoglobin 11-12  ## Disposition:--WIll continue to follow.   ## Behavioral / Environmental: -Delirium Precautions: Delirium Interventions for Nursing and Staff: - RN to open blinds every AM. - To Bedside: Glasses, hearing aide, and pt's own shoes. Make available to patients. when possible and encourage use. - Encourage po fluids when appropriate, keep fluids within reach. - OOB to chair with meals. - Passive ROM exercises to all extremities with AM & PM care. - RN to assess orientation to person, time and place QAM and PRN. - Recommend extended visitation hours with familiar family/friends as feasible. - Staff to minimize disturbances at night. Turn off television when pt asleep or when not in use.    ## Safety and Observation Level:  - Based on my clinical evaluation, I estimate the patient to be at low risk of self harm in the current setting. - At this time, we recommend  routine. This decision is based on my review of the chart including patient's history and current presentation, interview of the patient, mental status examination, and consideration of suicide risk including evaluating suicidal ideation, plan, intent, suicidal or self-harm behaviors, risk factors, and protective factors. This judgment is based on our ability  to directly address suicide risk, implement suicide prevention strategies, and develop a safety plan while the patient is in the clinical setting. Please contact our team if there is a concern that risk level has changed.  CSSR Risk Category:C-SSRS RISK CATEGORY: No Risk  Suicide Risk Assessment: Patient has following modifiable risk factors for suicide: untreated depression, which we are addressing by medication adjustments. Patient has following non-modifiable or demographic risk factors for suicide: Male and psychiatric hospitalization Patient has the following protective factors against suicide: n/a  Thank you for this consult request. Recommendations have been  communicated to the primary team.  We will continue to follow at this time.   Sharlot Becker, NP       History of Present Illness  Relevant Aspects of [facility] Course per hospitalist:  PCCM Xfer 57/75. 58 year old male, homeless, medical history significant for alcohol use disorder, bipolar and panic disorder, [facility] jail in September 2025 with a large subdural hematoma.  Underwent craniotomy with flap replacement on 10/27.  Since then multiple OR visits including 10/30 for SDH evacuation, follow-up CT head 11/8 showed fluid collection underneath the bone flap, back to OR on 11/10 and the bone flap was removed, cultures were obtained, intraoperative cultures grew MRSA, treating for infected bone flap, ID on board and currently on IV vancomycin . Refer to PCCM progress note from 11/11 for detailed tabulation of hospital events until then.  Patient Report:  Unable to assess.    Psych ROS on initial assesment:  Depression: Reports he has been having depressed mood, anhedonia, worthlessness, at lowest in his life, but denies suicidal thoughts but does have morbid rumination Anxiety: Reports that he is an anxious person but that the anxiety has been much worse since being the hospital.  Says that he feels restless and is having issues sleeping because of it.  Reports that he is anxious about his situation where he will go from here. Mania (lifetime and current): Endorses having manic episodes but when asking about these episodes he does not describe any increased goal-directed activity, denies any decreased need for sleep, but does endorse feeling better, talking more, and impulsivity.  Denies having any dysfunction during these times.  Denies having any distractibility, grandiosity, flight of ideas, risky behaviors. Psychosis: (lifetime and current): Denies AVH or paranoia. Sleep: Reports that he cannot sleep with the shaking and in general   Psychiatric and  Social History  Psychiatric History:  Information collected from patient and chart review  Prev Dx/Sx: Bipolar disorder, methamphetamine use, substance-induced mood disorder, alcohol use disorder Current Psych Provider: None Home Meds (current): Restarted on Latuda  40 and titrated to 80 this hospitalization, was not taking anything prior to admission in the short period that  He was away from hospital.  Also restarted on Klonopin  Previous Med Trials: Lithium , Klonopin , Latuda , Haldol  as needed for agitation Therapy: None  Prior Psych Hospitalization: Previous hospitalizations although the psychiatric hospitalizations are not documented but the emergency department visits prior to have been documented and include episodes documented as mania but are occurring in the presence of positive amphetamine Prior Self Harm: Denies Prior Violence: Denies  Family Psych History: Denies Family Hx suicide: Denies  Social History:  Reports that he has been experiencing homelessness.  Does not report any recent incarceration which appears to conflict with previous chart review.  Does report he has a upcoming court date on December 7.  Denies access to weapons.  Reports that his hobby is making rock music.  Reports that he has a friend who is his international aid/development worker. Access to weapons/lethal means: Denies  Substance History Denies using any substances recently.  Has been in controlled setting.  Does have history of using stimulants but he denies when asked during interview.  Exam Findings  Physical Exam:  Vital Signs:  Temp:  [97.4 F (36.3 C)-98.8 F (37.1 C)] 97.5 F (36.4 C) (12/07 1227) Pulse Rate:  [50-68] 50 (12/07 1227) Resp:  [18-20] 20 (12/07 1227) BP: (94-153)/(71-103) 121/97 (12/07 1227) SpO2:  [95 %-98 %] 97 % (12/07 1227) Weight:  [62.5 kg] 62.5 kg (12/07 0447) Blood pressure (!) 121/97, pulse (!) 50, temperature (!) 97.5 F (36.4 C), resp. rate 20, height 5' 10 (1.778 m), weight 62.5  kg, SpO2 97%. Body mass index is 19.77 kg/m.  Physical Exam Vitals and nursing note reviewed.  Constitutional:      General: He is sleeping.     Interventions: He is restrained.     Comments: Chronic ill appearing  Pulmonary:     Effort: Pulmonary effort is normal.  Neurological:     Mental Status: He is lethargic.     Comments: No pain or stiffness with passive ROM of bilateral upper extremities.       Mental Status Exam: Unable to assess, patient obtunded     Other History   These have been pulled in through the EMR, reviewed, and updated if appropriate.  Family History:  The patient's family history is not on file.  Medical History: Past Medical History:  Diagnosis Date   Anxiety    Bipolar 1 disorder (HCC)    Depression    Hepatitis    Hypertension    MRSA (methicillin resistant Staphylococcus aureus)    5/25 arm abscess and 11/25    Surgical History: Past Surgical History:  Procedure Laterality Date   BURR HOLE N/A 08/01/2024   Procedure: CREATION, CRANIAL BURR HOLE WITH EVACUATION OF HEMATOMA;  Surgeon: Rosslyn Dino HERO, MD;  Location: [facility] OR;  Service: Neurosurgery;  Laterality: N/A;   BURR HOLE N/A 08/12/2024   Procedure: left bone flap removal;  Surgeon: Rosslyn Dino HERO, MD;  Location: [facility] OR;  Service: Neurosurgery;  Laterality: N/A;   CRANIOPLASTY, WITH BONE FLAP REPLACEMENT Left 07/29/2024   Procedure: CRANIOPLASTY, WITH BONE FLAP REPLACEMENT;  Surgeon: Rosslyn Dino HERO, MD;  Location: [facility] OR;  Service: Neurosurgery;  Laterality: Left;  LEFT CRANIOPLASTY WITH ARTIFICAL BONE FLAP REPLACEMENT   CRANIOTOMY Left 06/30/2024   Procedure: CRANIOTOMY HEMATOMA EVACUATION SUBDURAL;  Surgeon: Melonie Grass, MD;  Location: [facility] ORS;  Service: Neurosurgery;  Laterality: Left;     Medications:   Current Facility-Administered Medications:    acetaminophen  (TYLENOL ) tablet 650 mg, 650 mg, Oral, Q4H PRN, 650 mg at 09/06/24 2022 **OR** [PENDING] acetaminophen   (TYLENOL ) tablet 650 mg, 650 mg, Per Tube, Q4H PRN **OR** acetaminophen  (TYLENOL ) suppository 650 mg, 650 mg, Rectal, Q4H PRN **OR** [PENDING] acetaminophen  (TYLENOL ) suppository 650 mg, 650 mg, Rectal, Q4H PRN,    Chlorhexidine  Gluconate Cloth 2 % PADS 6 each, 6 each, Topical, Daily, Franky Redia SAILOR, MD, 6 each at 09/06/24 1002   clonazePAM  (KLONOPIN ) tablet 0.5 mg, 0.5 mg, Oral, TID, Rai, Ripudeep K, MD, 0.5 mg at 09/08/24 0920   docusate (COLACE) 50 MG/5ML liquid 100 mg, 100 mg, Oral, Daily, Hongalgi, Anand D, MD, 100 mg at 09/05/24 1119   docusate sodium  (COLACE) capsule 100 mg, 100 mg, Oral, BID PRN, Autry, Lauren E, PA-C, 100 mg at 08/25/24 [id number]  feeding supplement (ENSURE PLUS HIGH PROTEIN) liquid 237 mL, 237 mL, Oral, BID BM, Danford, Lonni SQUIBB, MD, 237 mL at 09/07/24 1417   feeding supplement (OSMOLITE 1.5 CAL) liquid 1,000 mL, 1,000 mL, Per Tube, Continuous, Rai, Ripudeep K, MD, Last Rate: 60 mL/hr at 09/07/24 2000, 1,000 mL at 09/07/24 2000   feeding supplement (PROSource TF20) liquid 60 mL, 60 mL, Per Tube, Daily, Rai, Ripudeep K, MD, 60 mL at 09/08/24 0919   ferrous sulfate  tablet 325 mg, 325 mg, Oral, Q breakfast, Regalado, Belkys A, MD, 325 mg at 09/08/24 0600   folic acid  (FOLVITE ) tablet 1 mg, 1 mg, Oral, Daily, Ogbata, Sylvester I, MD, 1 mg at 09/08/24 0920   free water  100 mL, 100 mL, Per Tube, Q4H, Rai, Ripudeep K, MD, 100 mL at 09/08/24 1200   gabapentin  (NEURONTIN ) capsule 200 mg, 200 mg, Oral, TID, Danford, Lonni SQUIBB, MD, 200 mg at 09/08/24 1229   guaiFENesin -dextromethorphan  (ROBITUSSIN DM) 100-10 MG/5ML syrup 5 mL, 5 mL, Oral, Q4H PRN, Pudota, Ellouise SQUIBB, MD   heparin  injection 5,000 Units, 5,000 Units, Subcutaneous, Q8H, Desai, Rahul P, PA-C, 5,000 Units at 09/08/24 0600   labetalol  (NORMODYNE ) injection 10-40 mg, 10-40 mg, Intravenous, Q10 min PRN, Janjua, Rashid M, MD   lactated ringers  infusion, , Intravenous, Continuous, Regalado, Belkys A, MD, Last Rate: 75  mL/hr at 09/08/24 0429, New Bag at 09/08/24 0429   LORazepam  (ATIVAN ) injection 0.5 mg, 0.5 mg, Intravenous, Q6H PRN, Krishnan, Gokul, MD, 0.5 mg at 09/08/24 0445   magic mouthwash w/lidocaine , 5 mL, Oral, TID PRN, Regalado, Belkys A, MD, 5 mL at 09/03/24 1502   melatonin tablet 10 mg, 10 mg, Oral, QHS PRN, Shalhoub, George J, MD, 10 mg at 09/07/24 2056   mirtazapine  (REMERON ) tablet 15 mg, 15 mg, Oral, QHS, McCarty, Artie, MD, 15 mg at 09/07/24 2056   multivitamin with minerals tablet 1 tablet, 1 tablet, Oral, Daily, Rosario Eland I, MD, 1 tablet at 09/08/24 0920   nicotine  (NICODERM CQ  - dosed in mg/24 hours) patch 14 mg, 14 mg, Transdermal, Daily, Fredia Dorothe HERO, MD, 14 mg at 09/08/24 9071   nystatin  (MYCOSTATIN ) 100000 UNIT/ML suspension 500,000 Units, 5 mL, Oral, QID, Regalado, Belkys A, MD, 500,000 Units at 09/06/24 2149   ondansetron  (ZOFRAN ) tablet 4 mg, 4 mg, Oral, Q4H PRN **OR** ondansetron  (ZOFRAN ) injection 4 mg, 4 mg, Intravenous, Q4H PRN, Janjua, Rashid M, MD, 4 mg at 08/01/24 1506   Oral care mouth rinse, 15 mL, Mouth Rinse, PRN, Mannam, Praveen, MD   pantoprazole  (PROTONIX ) EC tablet 40 mg, 40 mg, Oral, QHS, Hongalgi, Anand D, MD, 40 mg at 09/07/24 2056   phenol (CHLORASEPTIC) mouth spray 1 spray, 1 spray, Mouth/Throat, PRN, Franky Redia SAILOR, MD, 1 spray at 09/03/24 0057   polyethylene glycol (MIRALAX  / GLYCOLAX ) packet 17 g, 17 g, Oral, Daily, Desai, Rahul P, PA-C, 17 g at 09/02/24 9078   promethazine  (PHENERGAN ) tablet 12.5-25 mg, 12.5-25 mg, Oral, Q4H PRN, Janjua, Rashid M, MD   propranolol  (INDERAL ) tablet 20 mg, 20 mg, Oral, BID, Regalado, Belkys A, MD, 20 mg at 09/08/24 0942   senna (SENOKOT) tablet 17.2 mg, 2 tablet, Oral, Daily, Desai, Rahul P, PA-C, 17.2 mg at 09/08/24 0920   sodium chloride  flush (NS) 0.9 % injection 10-40 mL, 10-40 mL, Intracatheter, Q12H, Franky Redia SAILOR, MD, 10 mL at 09/08/24 1002   sodium chloride  flush (NS) 0.9 % injection 10-40 mL,  10-40 mL, Intracatheter, PRN, Franky Redia SAILOR, MD, 30 mL at  07/29/24 1135   thiamine  (VITAMIN B1) tablet 100 mg, 100 mg, Oral, Daily, Hongalgi, Anand D, MD, 100 mg at 09/08/24 0920   valbenazine  (INGREZZA ) capsule 40 mg, 40 mg, Oral, Daily, Starkes-Perry, Takia S, FNP, 40 mg at 09/08/24 0920   vancomycin  (VANCOREADY) IVPB 750 mg/150 mL, 750 mg, Intravenous, Q12H, Regalado, Belkys A, MD, Last Rate: 150 mL/hr at 09/08/24 0957, 750 mg at 09/08/24 9042  Allergies: No Known Allergies  Sharlot Becker, NP    Review of Systems  Unable to perform ROS: Patient unresponsive  Psychiatric/Behavioral:         Not able to assess

## 2024-09-08 NOTE — Progress Notes (Signed)
 Patient is agitated, restless, uncooperative, trying to get out of bed and pull out his NGT. PRN Ativan  given at 20:25; Behavior unchanged. No sitter available at bedside.Night Provider informed. Soft wrist restraints initiated as ordered; Will re-assess every 2 hours. Charge Nurse aware.

## 2024-09-08 NOTE — Plan of Care (Incomplete Revision)
 Pt was difficult early on in shift Problem: Education: Goal: Knowledge of General Education information will improve Description: Including pain rating scale, medication(s)/side effects and non-pharmacologic comfort measures Outcome: Progressing   Problem: Health Behavior/Discharge Planning: Goal: Ability to manage health-related needs will improve Outcome: Progressing   Problem: Clinical Measurements: Goal: Ability to maintain clinical measurements within normal limits will improve Outcome: Progressing Goal: Will remain free from infection Outcome: Progressing Goal: Diagnostic test results will improve Outcome: Progressing Goal: Respiratory complications will improve Outcome: Progressing Goal: Cardiovascular complication will be avoided Outcome: Progressing   Problem: Activity: Goal: Risk for activity intolerance will decrease Outcome: Progressing   Problem: Nutrition: Goal: Adequate nutrition will be maintained Outcome: Progressing   Problem: Coping: Goal: Level of anxiety will decrease Outcome: Progressing   Problem: Elimination: Goal: Will not experience complications related to bowel motility Outcome: Progressing Goal: Will not experience complications related to urinary retention Outcome: Progressing   Problem: Pain Managment: Goal: General experience of comfort will improve and/or be controlled Outcome: Progressing   Problem: Safety: Goal: Ability to remain free from injury will improve Outcome: Progressing   Problem: Skin Integrity: Goal: Risk for impaired skin integrity will decrease Outcome: Progressing   Problem: Education: Goal: Knowledge of the prescribed therapeutic regimen will improve Outcome: Progressing   Problem: Clinical Measurements: Goal: Usual level of consciousness will be regained or maintained. Outcome: Progressing Goal: Neurologic status will improve Outcome: Progressing Goal: Ability to maintain intracranial pressure will  improve Outcome: Progressing   Problem: Skin Integrity: Goal: Demonstration of wound healing without infection will improve Outcome: Progressing

## 2024-09-08 NOTE — Plan of Care (Signed)
  Problem: Education: Goal: Knowledge of General Education information will improve Description: Including pain rating scale, medication(s)/side effects and non-pharmacologic comfort measures Outcome: Progressing   Problem: Health Behavior/Discharge Planning: Goal: Ability to manage health-related needs will improve Outcome: Progressing   Problem: Clinical Measurements: Goal: Ability to maintain clinical measurements within normal limits will improve Outcome: Progressing Goal: Will remain free from infection Outcome: Progressing Goal: Diagnostic test results will improve Outcome: Progressing Goal: Respiratory complications will improve Outcome: Progressing Goal: Cardiovascular complication will be avoided Outcome: Progressing   Problem: Activity: Goal: Risk for activity intolerance will decrease Outcome: Progressing   Problem: Nutrition: Goal: Adequate nutrition will be maintained Outcome: Progressing   Problem: Coping: Goal: Level of anxiety will decrease Outcome: Progressing   Problem: Elimination: Goal: Will not experience complications related to bowel motility Outcome: Progressing Goal: Will not experience complications related to urinary retention Outcome: Progressing   Problem: Pain Managment: Goal: General experience of comfort will improve and/or be controlled Outcome: Progressing   Problem: Safety: Goal: Ability to remain free from injury will improve Outcome: Progressing   Problem: Skin Integrity: Goal: Risk for impaired skin integrity will decrease Outcome: Progressing   Problem: Education: Goal: Knowledge of the prescribed therapeutic regimen will improve Outcome: Progressing   Problem: Clinical Measurements: Goal: Usual level of consciousness will be regained or maintained. Outcome: Progressing Goal: Neurologic status will improve Outcome: Progressing Goal: Ability to maintain intracranial pressure will improve Outcome: Progressing    Problem: Skin Integrity: Goal: Demonstration of wound healing without infection will improve Outcome: Progressing

## 2024-09-08 NOTE — Progress Notes (Signed)
 Speech Language Pathology Treatment: Dysphagia  Patient Details Name: Ernest Romero MRN: [id number] DOB: [birth year] Today's Date: 09/08/2024 Time: 8986-8961 SLP Time Calculation (min) (ACUTE ONLY): 25 min  Assessment / Plan / Recommendation Clinical Impression  PLAN: Continue NPO except ice chips/small sips water . Therapeutic trials of purees with SLP only.   Continue cortrak. Will need repeat MBS this week and discussion about long-term nutrition (PO or otherwise) if no improvements are evident.  Orders received for bedside swallowing evaluation. Pt already on caseload and being followed for dysphagia- he was seen today. Alert, more interactive. Continues with jaw tremor/tardive dyskinesia at rest.  Pt provided with standard toothbrush and able to brush his teeth/swish/spit independently. He accepted several ice chips, small sips of water  water  with encouragement for effortful swallow.  Self fed a container of applesauce with cues needed to slow down and use follow-up swallows to clear residue. Delayed coughing noted intermittently during session, likely aspiration. He has poor awareness/insight into deficits.    HPI HPI: Patient is a 58 y.o. male who admitted to [facility] 06/30/24 after an unresponsive episode in jail. Dx large SDH;  underwent left frontoparietal craniotomy with evacuation of SDH and drain placement 9/28 (drain removed 9/29). Transferred to [facility] for management of crani/flap 10/14. Cranioplasty with bone flap replacement 10/27; worsening neuro with midline shift; underwent  left burr hole for evacuation 10/30. Bone flap removal on 11/10.  Bedside swallow eval 10/28 after change in mentaion and acute change in swallowing; NPO, then advanced to dysphagia 3/thin liquids 10/31.  New orders 11/18 due to coughing with POs - rec continued Dys3/thin liquids. Repeat orders 11/28 due to acute change again in swallow. MBS completed on 11/29 - severe oropharyngeal dysphagia with aspiration of all  liquids and puree solids; rec NPO. Repeat MBS 12/4- no significant change. Cortrak placed. [facility] 12/1 with evolving postoperative changes from left sided hemicraniectomy and bone flap removal without evidence of an acute intracranial abnormality or significant residual subdural collection.      SLP Plan  Continue with current plan of care        Swallow Evaluation Recommendations   Recommendations: Ice chips PRN after oral care;NPO Medication Administration: Via alternative means Oral care recommendations: Oral care QID (4x/day);Oral care before ice chips/water      Recommendations                     Oral care QID;Oral care prior to ice chip/H20   Frequent or constant Supervision/Assistance Dysphagia, oropharyngeal phase (R13.12)     Continue with current plan of care    Lashala Laser L. Vona, MA CCC/SLP Clinical Specialist - Acute Care SLP Acute Rehabilitation Services Office number [phone]  [name]  09/08/2024, 10:57 AM

## 2024-09-09 ENCOUNTER — Inpatient Hospital Stay (HOSPITAL_COMMUNITY): Payer: MEDICAID

## 2024-09-09 LAB — GLUCOSE, CAPILLARY
Glucose-Capillary: 113 mg/dL — ABNORMAL HIGH (ref 70–99)
Glucose-Capillary: 116 mg/dL — ABNORMAL HIGH (ref 70–99)
Glucose-Capillary: 100 mg/dL — ABNORMAL HIGH (ref 70–99)
Glucose-Capillary: 109 mg/dL — ABNORMAL HIGH (ref 70–99)
Glucose-Capillary: 110 mg/dL — ABNORMAL HIGH (ref 70–99)
Glucose-Capillary: 119 mg/dL — ABNORMAL HIGH (ref 70–99)
Glucose-Capillary: 118 mg/dL — ABNORMAL HIGH (ref 70–99)

## 2024-09-09 MED ORDER — BENZTROPINE MESYLATE 1 MG PO TABS
1.0000 mg | ORAL_TABLET | Freq: Two times a day (BID) | ORAL | Status: AC
  Administered 2024-09-09 – 2024-11-08 (×121): 1 mg via ORAL
  Filled 2024-09-09 (×110): qty 1

## 2024-09-09 NOTE — Progress Notes (Signed)
 Physical Therapy Treatment and progress Note Patient Details Name: Ernest Romero MRN: [id number] DOB: 1966-04-20 Today's Date: 09/09/2024   History of Present Illness Pt is a 58 y.o. male presenting 10/14 from [facility] where he was admitted 9/28 for unresponsive episode in the jail. 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 On CIWA protocol. Transferred to [facility] for further management of craniotomy and flap. 10/27 s/p cranioplasty with bone flap replacement. 10/28 Change in status thought to be related to Klonopin  administration 10/28, repeat CTH shows significant increase in size of mixed attenuation subdural fluid collection underlying the L tempoparietal craniotomy site, now measuring approximately 15 mm in thickness, with associated mild mass effect and slight rightward midline shift. OR on 10/30 for SDH evacuation. [facility] 11/7 with fluid collected under bone flap. 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 PMH alcohol use disorder, psychiatric disorder, HTN    PT Comments  Pt is progressing towards goals. Currently pt is supervision for bed mobility, Min A for sit to stand and gait with RW. Pt with low floor clearance, shuffling steps that improves for ~25 ft with cues. Pt continues with posteriorly biased BOS over COM with difficulty correcting. High risk for falls; cues for safety throughout. Due to pt current functional status, home set up and available assistance at home recommending skilled physical therapy services < 3 hours/day in order to address strength, balance and functional mobility to decrease risk for falls, injury, immobility, skin break down and re-hospitalization.      If plan is discharge home, recommend the following: A lot of help with bathing/dressing/bathroom;Assistance with cooking/housework;Direct supervision/assist for medications management;Direct supervision/assist for financial  management;Assist for transportation;Help with stairs or ramp for entrance;Supervision due to cognitive status;A little help with walking and/or transfers   Can travel by private vehicle     Yes  Equipment Recommendations  Wheelchair (measurements PT);Wheelchair cushion (measurements PT);Rolling walker (2 wheels)       Precautions / Restrictions Precautions Precautions: Fall Recall of Precautions/Restrictions: Impaired Precaution/Restrictions Comments: SBP <160, helmet (pt to wear when OOB per RN) Restrictions Weight Bearing Restrictions Per Provider Order: No     Mobility  Bed Mobility Overal bed mobility: Needs Assistance Bed Mobility: Supine to Sit     Supine to sit: Supervision Sit to supine: Supervision   General bed mobility comments: increased time, supervision for safety    Transfers Overall transfer level: Needs assistance Equipment used: Rolling walker (2 wheels) Transfers: Sit to/from Stand Sit to Stand: Min assist           General transfer comment: Min A for stabilization    Ambulation/Gait Ambulation/Gait assistance: Contact guard assist, Min assist Gait Distance (Feet): 300 Feet Assistive device: Rolling walker (2 wheels) Gait Pattern/deviations: Step-through pattern Gait velocity: decreased Gait velocity interpretation: 1.31 - 2.62 ft/sec, indicative of limited community ambulator   General Gait Details: mildly unsteady and retropulsive, straight, posterior biased posture, short stride.  pt able to slightly improve gait with cues for larger steps, upright posture. Pt cued to perform marching with high knees with improved floor clearance for ~ 25 ft    Balance Overall balance assessment: Needs assistance Sitting-balance support: Feet supported Sitting balance-Leahy Scale: Fair Sitting balance - Comments: posterior bias requiring cueing and able to self correct, one time posterior LOB but generally able to maintain upright midline for drinking and  helmet donning/doffing Postural control: Posterior lean Standing balance  support: Single extremity supported Standing balance-Leahy Scale: Poor Standing balance comment: cues for chin tuck and cervical flexion as well as flexing slightly at pelvis in order to decrease posterior bias.      Communication Communication Communication: Impaired Factors Affecting Communication: Reduced clarity of speech  Cognition Arousal: Alert Behavior During Therapy: Flat affect   PT - Cognitive impairments: Memory, Initiation, Problem solving, Safety/Judgement, Sequencing     Rancho Levels of Cognitive Functioning Rancho Los Amigos Scales of Cognitive Functioning: Automatic, Appropriate: Minimal Assistance for Daily Living Skills Rancho Los Amigos Scales of Cognitive Functioning: Automatic, Appropriate: Minimal Assistance for Daily Living Skills [VII] PT - Cognition Comments: able to hold basic conversation and express needs to PT and nursing staff, but needed heavy cues for sequencing and path finding Following commands: Impaired Following commands impaired: Follows one step commands with increased time    Cueing Cueing Techniques: Verbal cues     General Comments General comments (skin integrity, edema, etc.): no signs/symptoms of cardiac/respiratory distress during session.      Pertinent Vitals/Pain Pain Assessment Pain Assessment: No/denies pain     PT Goals (current goals can now be found in the care plan section) Acute Rehab PT Goals Patient Stated Goal: none stated PT Goal Formulation: With patient Time For Goal Achievement: 09/23/24 Potential to Achieve Goals: Fair Progress towards PT goals: Progressing toward goals    Frequency    Min 2X/week      PT Plan  Continue with current POC        AM-PAC PT 6 Clicks Mobility   Outcome Measure  Help needed turning from your back to your side while in a flat bed without using bedrails?: A Little Help needed moving from lying  on your back to sitting on the side of a flat bed without using bedrails?: A Little Help needed moving to and from a bed to a chair (including a wheelchair)?: A Little Help needed standing up from a chair using your arms (e.g., wheelchair or bedside chair)?: A Little Help needed to walk in hospital room?: A Little Help needed climbing 3-5 steps with a railing? : A Lot 6 Click Score: 17    End of Session Equipment Utilized During Treatment: Gait belt;Other (comment) (helmet) Activity Tolerance: Patient tolerated treatment well Patient left: in bed;with call bell/phone within reach;with bed alarm set Nurse Communication: Mobility status PT Visit Diagnosis: Other symptoms and signs involving the nervous system (R29.898);Muscle weakness (generalized) (M62.81)     Time: 8862-8842 PT Time Calculation (min) (ACUTE ONLY): 20 min  Charges:    $Therapeutic Activity: 8-22 mins PT General Charges $$ ACUTE PT VISIT: 1 Visit                     Ernest Romero, DPT, CLT  Acute Rehabilitation Services Office: [phone] (Secure chat preferred)    Ernest Romero 09/09/2024, 12:03 PM

## 2024-09-09 NOTE — Evaluation (Addendum)
 Modified Barium Swallow Study  Patient Details  Name: Ernest Romero MRN: [id number] Date of Birth: [birth year]  Today's Date: 09/09/2024  Modified Barium Swallow completed.  Full report located under Chart Review in the Imaging Section.  History of Present Illness Ernest Romero is a 58 y.o. male who was admitted to [facility] 06/30/24 after an unresponsive episode in jail. Dx large SDH; underwent left frontoparietal craniotomy with evacuation of SDH and drain placement 9/28 (drain removed 9/29). Transferred to [facility] for management of crani/flap 10/14. Cranioplasty with bone flap replacement 10/27; worsening neuro with midline shift; underwent  left burr hole for evacuation 10/30. Bone flap removal on 11/10.  Bedside swallow eval 10/28 after change in mentaion and acute change in swallowing; NPO, then advanced to dysphagia 3/thin liquids 10/31.  New orders 11/18 due to coughing with POs - rec continued Dys3/thin liquids. Repeat orders 11/28 due to acute change again in swallow. MBS completed on 11/29 - severe oropharyngeal dysphagia with aspiration of all liquids and puree solids; rec NPO. Repeat MBS 12/4- no significant change. Cortrak placed. [facility] 12/1 with evolving postoperative changes from left sided hemicraniectomy and bone flap removal without evidence of an acute intracranial abnormality or significant residual subdural collection.   Clinical Impression  1) Recommend starting a dysphagia 3 diet; thin liquids.   2) Turn head to the left when swallowing. 3) If possible, crush meds in puree. Otherwise give whole with applesauce with head turned to the left.   He will cough occasionally when eating - this should not create panic. Even in the presence of aspiration, we can stave off potential pna by ensuring pt gets frequent oral care and that he is being mobilized regularly.   He should eat meals in recliner when possible.  SLP will follow for overall toleration/education.   Ernest Romero' swallow has  improved, however, he continues to have occasional aspiration of POs. This appears to be related to a too-tight UES.  Aspiration occurred infrequently and generally after the swallow when material sitting within the pharyngo-esophageal segment was squeezed upward then spilled over the arytenoids and into the trachea.  A head turn to the left appeared to improve bolus flow through the PES.  Overall, there was improved laryngeal mobility and epiglottic inversion. Pt was attentive and able to follow commands.  He is adamant that he wants to resume eating.    Spoke with Dr. Davia and RD, Ernest Romero. There may be some benefit to removing the cortrak and allowing more space for bolus flow.  They agreed with plan.     Factors that may increase risk of adverse event in presence of aspiration Ernest Romero & Ernest Romero 2021): Limited mobility;Frail or deconditioned;Inadequate oral hygiene;Weak cough;Aspiration of thick, dense, and/or acidic materials  Swallow Evaluation Recommendations Recommendations: PO diet PO Diet Recommendation: Dysphagia 3 (Mechanical soft);Thin liquids (Level 0) Liquid Administration via: Cup;Straw Medication Administration: Crushed with puree (when able. Otherwise give whole with puree) Supervision: Full supervision/cueing for swallowing strategies Swallowing strategies  : effortful swallow;Head turn left during swallowing Postural changes: Position pt fully upright for meals Oral care recommendations: Oral care QID (4x/day)   Ernest Romero L. Vona, MA CCC/SLP Clinical Specialist - Acute Care SLP Acute Rehabilitation Services Office number [phone]    Ernest Romero 09/09/2024,3:27 PM

## 2024-09-09 NOTE — Progress Notes (Signed)
 Occupational Therapy Treatment Patient Details Name: Ernest Romero MRN: [id number] DOB: 20-Oct-1965 Today's Date: 09/09/2024   History of present illness Pt is a 58 y.o. male presenting 10/14 from [facility] where he was admitted 9/28 for unresponsive episode in the jail. 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 On CIWA protocol. Transferred to [facility] for further management of craniotomy and flap. 10/27 s/p cranioplasty with bone flap replacement. 10/28 Change in status thought to be related to Klonopin  administration 10/28, repeat CTH shows significant increase in size of mixed attenuation subdural fluid collection underlying the L tempoparietal craniotomy site, now measuring approximately 15 mm in thickness, with associated mild mass effect and slight rightward midline shift. OR on 10/30 for SDH evacuation. [facility] 11/7 with fluid collected under bone flap. 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 PMH alcohol use disorder, psychiatric disorder, HTN   OT comments  Pt progressing toward established OT goals. Able to recall 3/3 swallowing precautions stated by SLP on OT entry at end of OT session and self implement without cues while finishing snack at end of OT session. Pt with improving mobility this session, eager to ambulate into hall. Pt needing CGA for safety, intermittent min A for balance. Performing IADL of bed making/straightening (chuck, adding new sheet, adjusting pillow, and pulling fitted sheet around bottom corner of bed) on return to room with min A for balance. Pt excited about potential to go to rehab, will continue to follow acutely. Patient will benefit from continued inpatient follow up therapy, <3 hours/day       If plan is discharge home, recommend the following:  Supervision due to cognitive status;Direct supervision/assist for medications management;Assistance with cooking/housework;Direct  supervision/assist for financial management;Assist for transportation;Assistance with feeding;A little help with walking and/or transfers;Help with stairs or ramp for entrance;A little help with bathing/dressing/bathroom   Equipment Recommendations  BSC/3in1    Recommendations for Other Services      Precautions / Restrictions Precautions Precautions: Fall Recall of Precautions/Restrictions: Impaired Precaution/Restrictions Comments: SBP <160, helmet (pt to wear when OOB per RN) Restrictions Weight Bearing Restrictions Per Provider Order: No       Mobility Bed Mobility Overal bed mobility: Needs Assistance Bed Mobility: Supine to Sit, Sit to Supine     Supine to sit: Supervision Sit to supine: Supervision   General bed mobility comments: increased time, supervision for safety    Transfers Overall transfer level: Needs assistance Equipment used: Rolling walker (2 wheels) Transfers: Sit to/from Stand Sit to Stand: Min assist           General transfer comment: steadying only     Balance Overall balance assessment: Needs assistance Sitting-balance support: Feet supported Sitting balance-Leahy Scale: Fair     Standing balance support: No upper extremity supported Standing balance-Leahy Scale: Poor                             ADL either performed or assessed with clinical judgement   ADL Overall ADL's : Needs assistance/impaired                     Lower Body Dressing: Minimal assistance;Sitting/lateral leans   Toilet Transfer: Contact guard assist;Ambulation           Functional mobility during ADLs: Contact guard assist      Extremity/Trunk Assessment  Vision       Perception     Praxis     Communication Communication Communication: Impaired Factors Affecting Communication: Reduced clarity of speech   Cognition Arousal: Alert Behavior During Therapy: Flat affect Cognition: Cognition impaired      Awareness: Online awareness impaired Memory impairment (select all impairments):  (pt able to recall swallow precautions and recommendations from SLP with handoff from SLP and at end of OT session. asking for afternoon meds which RN confirms are due) Attention impairment (select first level of impairment): Divided attention Executive functioning impairment (select all impairments): Organization, Sequencing, Reasoning, Problem solving OT - Cognition Comments: pt with much improved spirits per RN with cortrak pulled this afternoon and SLP in room on OT arrival. Following one to two step commands.               Rancho [employer]ant Scales of Cognitive Functioning: Automatic, Appropriate: Minimal Assistance for Daily Living Skills [VII] Following commands: Impaired Following commands impaired: Follows one step commands with increased time, Follows multi-step commands inconsistently, Follows multi-step commands with increased time      Cueing   Cueing Techniques: Verbal cues  Exercises      Shoulder Instructions       General Comments      Pertinent Vitals/ Pain       Pain Assessment Pain Assessment: No/denies pain  Home Living                                          Prior Functioning/Environment              Frequency  Min 2X/week        Progress Toward Goals  OT Goals(current goals can now be found in the care plan section)  Progress towards OT goals: Progressing toward goals  Acute Rehab OT Goals Patient Stated Goal: I'm getting to go to rehab OT Goal Formulation: With patient Time For Goal Achievement: 09/19/24 Potential to Achieve Goals: Fair  Plan      Co-evaluation                 AM-PAC OT 6 Clicks Daily Activity     Outcome Measure   Help from another person eating meals?: A Little Help from another person taking care of personal grooming?: A Little Help from another person toileting, which includes using toliet,  bedpan, or urinal?: A Little Help from another person bathing (including washing, rinsing, drying)?: A Little Help from another person to put on and taking off regular upper body clothing?: A Little Help from another person to put on and taking off regular lower body clothing?: A Little 6 Click Score: 18    End of Session Equipment Utilized During Treatment: Gait belt;Other (comment) (helmet)  OT Visit Diagnosis: Other abnormalities of gait and mobility (R26.89);Muscle weakness (generalized) (M62.81);Other symptoms and signs involving the nervous system (R29.898);Cognitive communication deficit (R41.841);Other symptoms and signs involving cognitive function   Activity Tolerance Patient tolerated treatment well   Patient Left in bed;with call bell/phone within reach;with bed alarm set   Nurse Communication Mobility status;Precautions (pt asking if medications are due. Updated RN on swallow precautions 2/2 RN reports unaware of which diet pt is on and what recs are)        Time: 1550-1606 OT Time Calculation (min): 16 min  Charges: OT General Charges $OT Visit: 1 Visit OT  Treatments $Self Care/Home Management : 8-22 mins  Elma JONETTA Lebron FREDERICK, OTR/L [facility] Acute Rehabilitation Office: [phone]   Elma JONETTA Lebron 09/09/2024, 4:21 PM

## 2024-09-09 NOTE — Consult Note (Addendum)
 [facility] Psychiatric Consult Follow-up  Patient Name: .Ernest Romero  MRN: [id number]  DOB: Jul 30, 1966  Consult Order details:  Orders (From admission, onward)     Start     Ordered   08/22/24 0842  IP CONSULT TO PSYCHIATRY       Ordering Provider: Verdene Purchase, MD  Provider:  (Not yet assigned)  Question Answer Comment  Location [facility]   Reason for Consult? patient with h/o bipolar d/o. was on lithium . here with subdural hematoma and MRSA. improving. some facial trmors. Need help with medication management. he was on lithium , lurasidone . requiring haldol  prn.  thanks      08/22/24 0842   08/02/24 1712  IP CONSULT TO PSYCHIATRY       Ordering Provider: Adolph Tinnie FORBES, PA-C  Provider:  (Not yet assigned)  Question Answer Comment  Location [facility]   Reason for Consult? bipolar      08/02/24 1711   07/24/24 1656  IP CONSULT TO PSYCHIATRY       Ordering Provider: Patsy Lenis, MD  Provider:  (Not yet assigned)  Question Answer Comment  Location [facility]   Reason for Consult? needing formal eval for capacity; may need guardianship given no family/support for discharge      07/24/24 1656   07/16/24 1412  IP CONSULT TO PSYCHIATRY       Ordering Provider: Dino Antu, MD  Provider:  (Not yet assigned)  Question Answer Comment  Location [facility]   Reason for Consult? 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  MDD, recurrent, severe, without psychotic  features 3.  Stimulant use disorder, in controlled setting Rule out traumatic brain injury from chronic subdural hematomas and neurosurgical procedures and complications Rule out bipolar disorder but likely previous manic episodes were meth induced per chart review  Assessment  Ernest Romero is a 58 y.o. male admitted: Medicallyfor 07/15/2024 11:15 PM for recurrent subdural hematoma. He carries the psychiatric diagnoses of bipolar disorder and alcohol use disorder and has a past medical history of subdural hematoma status postcraniotomy.   Current differential includes drug-induced parkinsonism as most likely issue that is slowly responding to medications.  DIP is most consistent with the characteristics of tremor including the frequency of the tremor being slower and consistent, being present at rest, and resolving with patient focusing on stopping it or being distracted.  Similarly to this there is some concern patient might have rapid syndrome which should be treated similarly to DIP.  Due to concern for drug-induced parkinsonism continuing benztropine  1 mg twice daily.   He has some mild improvement over last several days per patient report. Patient reports worsening mood symptoms of depression (7 out of 10) and anxiety (9 out of 10) due to ongoing perioral movements.  Given concern for tardive dyskinesia titrating valbenazine , been on 80 mg x3 days with some improvement and will continue for now.  Regard would continue utilizing the benzodiazepines, propranolol , and mirtazapine . Will decrease from Klonipin 2 mg to 1 mg  TID due to oversedation, lethargy and difficulty with swallowing. Patient has had several scheduled doses held over the last 2 days and he tolerated 1 mg TID dosing better with less sedation effects.   09/05/2024: Unable to assess patient is obtunded  09/08/24 The patient was sitting up in bed eating applesauce with Speech Therapy at this beside watching him.  She reported he  started having swallowing issues in the past week and the feeding tube had to be placed.  The client stated for his mood, It's going good, not able to expand on this.  This provider discussed his jaw movements with the speech therapist who stated it is continuing.  Considering this is the first time I have evaluated him, I have nothing to compare.  When asked the patient, he said, It's going good.  The positive is the patient is not obtunded, calm and cooperative, psych will continue to follow.  09/09/24: Patient is seen and assessed by the psychiatry provider. He is awake, alert, and coherent, able to engage in a linear and goal-directed conversation. The majority of the psychiatric follow-up today centers around persistent jaw tremors and fasciculations. On observation, the patient exhibits rapid rhythmic jaw movements, described as a repetitive back-and-forth motion resembling cogwheeling or Parkinsonian-type tremor localized to the mandible. Movements are continuous at rest but noticeably diminish when the patient is speaking.  It is clinically evident that the patient is exhibiting features consistent with a neurological movement disorder, more suggestive of Parkinsonian tremor or extrapyramidal symptoms (EPS) rather than tardive dyskinesia (TD).The patient denies having these symptoms prior to hospitalization, suggesting a possible medication-induced etiology or unmasked underlying neurological condition. Neurology is already involved in managemen  Patient appears visibly anxious and distressed by the jaw tremors. He is currently receiving tube feeds and not taking food orally, likely due to aspiration risk related to jaw instability. Sleep is adequate; mood is anxious but stable.  As the original reason for psychiatric consultation (oversedation/agitation) has resolved with prior medication adjustments, the psychiatry consult service will sign off at this time.   Diagnoses:  Active Hospital  problems: Principal Problem:   Subdural hematoma (HCC) Active Problems:   Bipolar disorder (HCC)   Essential hypertension   Alcohol withdrawal (HCC)   Unable to make decisions about medical treatment due to impaired mental capacity   Protein-calorie malnutrition, severe   Acute metabolic encephalopathy   Infection of craniotomy plate    Differential Discussion:  Against TD: The movements are rhythmic and repetitive rather than irregular or choreoathetoid, and they occur at rest rather than during voluntary movement. Additionally, TD tends to involve the tongue, lips, or facial grimacing with delayed onset after chronic antipsychotic exposure--none of which are reported here.  More consistent with Parkinsonism/EPS: The pattern is tremulous, resembling cogwheeling, and suppresses with voluntary action (talking), which is characteristic of Parkinsonian tremor. This could represent medication-induced Parkinsonism or a coinciding idiopathic movement disorder. Chin and jaw tremors are very common in persons with parkinson's disease.   Plan   ## Psychiatric Medication Recommendations:  Continue mirtazapine  15 mg once daily at bedtime for akathisia, insomnia, mood; monitor for mania switching but has tolerated >1 wk including benefit for jaw movement at night and sleep Continue propranolol  as can be helpful for akithisia if present, can help anxiety, and primary team can titrate for HTN Reduce clonazepam  0.5 mg two times daily for possible akathisia Ativan  0.5 PRN could potentially be d/c soon after 2 doses Continue valbenazine  40 mg tardive dyskinesia; Patient  with marked drowsiness since increasing the dose on 11/30 Consider pregabalin for TBI / anxiety in future Resume Cogentin  1mg  po BID as a therapeutic trial, recognizing uncertain benefit but potential for improvement in EPS symptoms.   ## Medical Decision Making Capacity: Not specifically addressed in this encounter  ## Further  Work-up:  -- TSH 1 month ago 0.775 While pt on Qtc prolonging medications, please monitor & replete K+ to 4 and Mg2+ to 2 -- most recent EKG on 11/19 had QtC of 414 -- Pertinent labwork reviewed earlier this admission includes: GFR greater than 60, LFT relatively within normal limits mildly elevated ALT, hemoglobin 11-12 -Consider OT/SLP for jaw, chin and oral therapy to help with strengthening of the jaws and surrounding muscles. May also be a good candidate for outpatient deep brain stimulation.   ## Disposition:--No barriers to discharge at this time.   ## Behavioral / Environmental: -Delirium Precautions: Delirium Interventions for Nursing and Staff: - RN to open blinds every AM. - To Bedside: Glasses, hearing aide, and pt's own shoes. Make available to patients. when possible and encourage use. - Encourage po fluids when appropriate, keep fluids within reach. - OOB to chair with meals. - Passive ROM exercises to all extremities with AM & PM care. - RN to assess orientation to person, time and place QAM and PRN. - Recommend extended visitation hours with familiar family/friends as feasible. - Staff to minimize disturbances at night. Turn off television when pt asleep or when not in use.    ## Safety and Observation Level:  - Based on my clinical evaluation, I estimate the patient to be at low risk of self harm in the current setting. - At this time, we recommend  routine. This decision is based on my review of the chart including patient's history and current presentation, interview of the patient, mental status examination, and consideration of suicide risk including evaluating suicidal ideation, plan, intent, suicidal or self-harm behaviors, risk factors, and protective factors. This judgment is based on our ability to directly address suicide risk, implement suicide prevention strategies, and develop a safety plan while the patient is in the clinical setting. Please contact our team if there is  a concern that risk level has changed.  CSSR Risk Category:C-SSRS RISK CATEGORY: No Risk  Suicide Risk Assessment: Patient has following modifiable risk factors for suicide: untreated depression, which we are addressing by medication adjustments. Patient has following non-modifiable or demographic risk factors for suicide: Male and psychiatric hospitalization Patient has the following protective factors against suicide: n/a  Thank you for this consult request. Recommendations have been communicated to the primary team.  We will sign off at this time.   Majel GORMAN Ramp, FNP       History of Present Illness  Relevant Aspects of [facility] Course per hospitalist:  PCCM Xfer 84/65. 58 year old male, homeless, medical history significant for alcohol use disorder, bipolar and panic disorder, [facility] jail [facility] September 2025 with a large subdural hematoma.  Underwent craniotomy with flap replacement on 10/27.  Since then multiple OR visits including 10/30 for SD[facility] evacuation, follow-up CT head 11/8 showed fluid collection underneath the bone flap, back to OR on 11/10 and the bone flap was removed, cultures were obtained, intraoperative cultures grew MRSA, treating for infected bone flap, ID on board and currently on IV vancomycin . Refer to PCCM progress note from 11/11 for detailed tabulation of hospital events until then.  Patient Report:   Patient reports that  he wants to be involved in discussions regarding his care, stating, "I want to be part of the decision-making if you're going to be talking about me. I'm the one suffering and having to go through this." He describes the jaw and chin tremors as very uncomfortable and repeatedly says, "Please make it stop." He reports that his sleep has been adequate and that, aside from feeling anxious related to the ongoing chin tremors, his mood is "I've been better, but I'm okay." Patient is alert and oriented to person,  place, time, and situation. Denies si/hi/avh.   Psych ROS on initial assesment:  Depression: Reports he has been having depressed mood, anhedonia, worthlessness, at lowest in his life, but denies suicidal thoughts but does have morbid rumination Anxiety: Reports that he is an anxious person but that the anxiety has been much worse since being the hospital.  Says that he feels restless and is having issues sleeping because of it.  Reports that he is anxious about his situation where he will go from here. Mania (lifetime and current): Endorses having manic episodes but when asking about these episodes he does not describe any increased goal-directed activity, denies any decreased need for sleep, but does endorse feeling better, talking more, and impulsivity.  Denies having any dysfunction during these times.  Denies having any distractibility, grandiosity, flight of ideas, risky behaviors. Psychosis: (lifetime and current): Denies AVH or paranoia. Sleep: Reports that he cannot sleep with the shaking and in general   Psychiatric and Social History  Psychiatric History:  Information collected from patient and chart review  Prev Dx/Sx: Bipolar disorder, methamphetamine use, substance-induced mood disorder, alcohol use disorder Current Psych Provider: None Home Meds (current): Restarted on Latuda  40 and titrated to 80 this hospitalization, was not taking anything prior to admission in the short period that  He was away from hospital.  Also restarted on Klonopin  Previous Med Trials: Lithium , Klonopin , Latuda , Haldol  as needed for agitation Therapy: None  Prior Psych Hospitalization: Previous hospitalizations although the psychiatric hospitalizations are not documented but the emergency department visits prior to have been documented and include episodes documented as mania but are occurring in the presence of positive amphetamine Prior Self Harm: Denies Prior Violence: Denies  Family Psych  History: Denies Family Hx suicide: Denies  Social History:  Reports that he has been experiencing homelessness.  Does not report any recent incarceration which appears to conflict with previous chart review.  Does report he has a upcoming court date on December 7.  Denies access to weapons.  Reports that his hobby is making rock music.  Reports that he has a friend who is his international aid/development worker. Access to weapons/lethal means: Denies  Substance History Denies using any substances recently.  Has been in controlled setting.  Does have history of using stimulants but he denies when asked during interview.  Exam Findings  Physical Exam:  Vital Signs:  Temp:  [97.4 F (36.3 C)-97.9 F (36.6 C)] 97.5 F (36.4 C) (12/08 1157) Pulse Rate:  [51-64] 53 (12/08 1157) Resp:  [18-20] 20 (12/08 1157) BP: (102-141)/(63-97) 128/86 (12/08 1157) SpO2:  [95 %-98 %] 97 % (12/08 1157) Weight:  [65.9 kg] 65.9 kg (12/08 0438) Blood pressure 128/86, pulse (!) 53, temperature (!) 97.5 F (36.4 C), temperature source Oral, resp. rate 20, height 5' 10 (1.778 m), weight 65.9 kg, SpO2 97%. Body mass index is 20.85 kg/m.  Physical Exam Vitals and nursing note reviewed.  Constitutional:      General: He  is sleeping.     Interventions: He is restrained.     Comments: Chronic ill appearing  HENT:     Mouth/Throat:     Comments: fine, rhythmic, repetitive tremors localized to the chin and jaw. Movements are most prominent at rest and partially suppress during speech. Pulmonary:     Effort: Pulmonary effort is normal.  Neurological:     General: No focal deficit present.     Mental Status: He is oriented to person, place, and time. Mental status is at baseline. He is lethargic.     Comments: No pain or stiffness with passive ROM of bilateral upper extremities.   Psychiatric:        Attention and Perception: Attention and perception normal.        Mood and Affect: Affect normal. Mood is anxious.        Speech:  Speech normal. Noncommunicative: shaky.        Behavior: Behavior normal. Behavior is cooperative.        Thought Content: Thought content normal.        Cognition and Memory: Cognition and memory normal.        Judgment: Judgment normal.      Mental Status Exam:  Speech is clear, spontaneous, and goal-directed. Mood is described as "I've been better, but I'm okay," and affect is mildly anxious but appropriate to content. Thought process is logical, coherent, and linear. Thought content is free of delusions, paranoia, or obsessions. The patient denies suicidal ideation, homicidal ideation, and auditory or visual hallucinations. There is no evidence of psychosis.  Cognition is grossly intact with normal attention, concentration, and memory during the interview. Insight is fair, as the patient recognizes his symptoms and expresses distress over them. Judgment is also fair, demonstrated by his willingness to participate in decision-making regarding his treatment.    Other History   These have been pulled in through the EMR, reviewed, and updated if appropriate.  Family History:  The patient's family history is not on file.  Medical History: Past Medical History:  Diagnosis Date   Anxiety    Bipolar 1 disorder (HCC)    Depression    Hepatitis    Hypertension    MRSA (methicillin resistant Staphylococcus aureus)    5/25 arm abscess and 11/25    Surgical History: Past Surgical History:  Procedure Laterality Date   BURR HOLE N/A 08/01/2024   Procedure: CREATION, CRANIAL BURR HOLE WITH EVACUATION OF HEMATOMA;  Surgeon: Rosslyn Dino HERO, MD;  Location: [facility] OR;  Service: Neurosurgery;  Laterality: N/A;   BURR HOLE N/A 08/12/2024   Procedure: left bone flap removal;  Surgeon: Rosslyn Dino HERO, MD;  Location: [facility] OR;  Service: Neurosurgery;  Laterality: N/A;   CRANIOPLASTY, WITH BONE FLAP REPLACEMENT Left 07/29/2024   Procedure: CRANIOPLASTY, WITH BONE FLAP REPLACEMENT;  Surgeon: Rosslyn Dino HERO, MD;  Location: [facility] OR;  Service: Neurosurgery;  Laterality: Left;  LEFT CRANIOPLASTY WITH ARTIFICAL BONE FLAP REPLACEMENT   CRANIOTOMY Left 06/30/2024   Procedure: CRANIOTOMY HEMATOMA EVACUATION SUBDURAL;  Surgeon: Melonie Grass, MD;  Location: [facility] ORS;  Service: Neurosurgery;  Laterality: Left;     Medications:   Current Facility-Administered Medications:    acetaminophen  (TYLENOL ) tablet 650 mg, 650 mg, Oral, Q4H PRN, 650 mg at 09/09/24 0556 **OR** [PENDING] acetaminophen  (TYLENOL ) tablet 650 mg, 650 mg, Per Tube, Q4H PRN **OR** acetaminophen  (TYLENOL ) suppository 650 mg, 650 mg, Rectal, Q4H PRN **OR** [PENDING] acetaminophen  (TYLENOL ) suppository 650 mg, 650 mg, Rectal, Q4H  PRN,    Chlorhexidine  Gluconate Cloth 2 % PADS 6 each, 6 each, Topical, Daily, Franky Redia SAILOR, MD, 6 each at 09/08/24 1000   clonazePAM  (KLONOPIN ) tablet 0.5 mg, 0.5 mg, Oral, TID, Rai, Ripudeep K, MD, 0.5 mg at 09/09/24 0952   docusate (COLACE) 50 MG/5ML liquid 100 mg, 100 mg, Oral, Daily, Hongalgi, Anand D, MD, 100 mg at 09/05/24 1119   docusate sodium  (COLACE) capsule 100 mg, 100 mg, Oral, BID PRN, Autry, Lauren E, PA-C, 100 mg at 08/25/24 0847   feeding supplement (ENSURE PLUS HIGH PROTEIN) liquid 237 mL, 237 mL, Oral, BID BM, Danford, Lonni SQUIBB, MD, 237 mL at 09/08/24 1526   feeding supplement (OSMOLITE 1.5 CAL) liquid 1,000 mL, 1,000 mL, Per Tube, Continuous, Rai, Ripudeep K, MD, Last Rate: 60 mL/hr at 09/07/24 2000, 1,000 mL at 09/07/24 2000   feeding supplement (PROSource TF20) liquid 60 mL, 60 mL, Per Tube, Daily, Rai, Ripudeep K, MD, 60 mL at 09/09/24 0951   ferrous sulfate  tablet 325 mg, 325 mg, Oral, Q breakfast, Regalado, Belkys A, MD, 325 mg at 09/09/24 0601   folic acid  (FOLVITE ) tablet 1 mg, 1 mg, Oral, Daily, Ogbata, Sylvester I, MD, 1 mg at 09/09/24 9047   free water  100 mL, 100 mL, Per Tube, Q4H, Rai, Ripudeep K, MD, 100 mL at 09/09/24 1200   gabapentin  (NEURONTIN ) capsule 200  mg, 200 mg, Oral, TID, Danford, Lonni SQUIBB, MD, 200 mg at 09/09/24 9047   guaiFENesin -dextromethorphan  (ROBITUSSIN DM) 100-10 MG/5ML syrup 5 mL, 5 mL, Oral, Q4H PRN, Pudota, Ellouise SQUIBB, MD, 5 mL at 09/09/24 0504   heparin  injection 5,000 Units, 5,000 Units, Subcutaneous, Q8H, Desai, Rahul P, PA-C, 5,000 Units at 09/09/24 0503   labetalol  (NORMODYNE ) injection 10-40 mg, 10-40 mg, Intravenous, Q10 min PRN, Janjua, Rashid M, MD   lactated ringers  infusion, , Intravenous, Continuous, Regalado, Belkys A, MD, Last Rate: 75 mL/hr at 09/09/24 0601, New Bag at 09/09/24 0601   LORazepam  (ATIVAN ) injection 0.5 mg, 0.5 mg, Intravenous, Q6H PRN, Krishnan, Gokul, MD, 0.5 mg at 09/09/24 9141   magic mouthwash w/lidocaine , 5 mL, Oral, TID PRN, Regalado, Belkys A, MD, 5 mL at 09/03/24 1502   melatonin tablet 10 mg, 10 mg, Oral, QHS PRN, Shalhoub, George J, MD, 10 mg at 09/08/24 2007   mirtazapine  (REMERON ) tablet 15 mg, 15 mg, Oral, QHS, McCarty, Artie, MD, 15 mg at 09/08/24 2006   multivitamin with minerals tablet 1 tablet, 1 tablet, Oral, Daily, Rosario Eland I, MD, 1 tablet at 09/09/24 9047   nicotine  (NICODERM CQ  - dosed in mg/24 hours) patch 14 mg, 14 mg, Transdermal, Daily, Fredia Dorothe HERO, MD, 14 mg at 09/09/24 0951   nystatin  (MYCOSTATIN ) 100000 UNIT/ML suspension 500,000 Units, 5 mL, Oral, QID, Regalado, Belkys A, MD, 500,000 Units at 09/06/24 2149   ondansetron  (ZOFRAN ) tablet 4 mg, 4 mg, Oral, Q4H PRN **OR** ondansetron  (ZOFRAN ) injection 4 mg, 4 mg, Intravenous, Q4H PRN, Janjua, Rashid M, MD, 4 mg at 08/01/24 1506   Oral care mouth rinse, 15 mL, Mouth Rinse, PRN, Mannam, Praveen, MD   pantoprazole  (PROTONIX ) EC tablet 40 mg, 40 mg, Oral, QHS, Hongalgi, Anand D, MD, 40 mg at 09/08/24 2007   phenol (CHLORASEPTIC) mouth spray 1 spray, 1 spray, Mouth/Throat, PRN, Franky Redia SAILOR, MD, 1 spray at 09/03/24 0057   polyethylene glycol (MIRALAX  / GLYCOLAX ) packet 17 g, 17 g, Oral, Daily, Desai, Rahul  P, PA-C, 17 g at 09/02/24 9078   promethazine  (PHENERGAN ) tablet 12.5-25  mg, 12.5-25 mg, Oral, Q4H PRN, Janjua, Rashid M, MD   propranolol  (INDERAL ) tablet 20 mg, 20 mg, Oral, BID, Regalado, Belkys A, MD, 20 mg at 09/09/24 9047   senna (SENOKOT) tablet 17.2 mg, 2 tablet, Oral, Daily, Desai, Rahul P, PA-C, 17.2 mg at 09/09/24 9047   sodium chloride  flush (NS) 0.9 % injection 10-40 mL, 10-40 mL, Intracatheter, Q12H, Franky Redia SAILOR, MD, 10 mL at 09/09/24 0953   sodium chloride  flush (NS) 0.9 % injection 10-40 mL, 10-40 mL, Intracatheter, PRN, Franky Redia SAILOR, MD, 30 mL at 07/29/24 1135   thiamine  (VITAMIN B1) tablet 100 mg, 100 mg, Oral, Daily, Hongalgi, Anand D, MD, 100 mg at 09/09/24 9047   valbenazine  (INGREZZA ) capsule 40 mg, 40 mg, Oral, Daily, Starkes-Perry, Rhoda Waldvogel S, FNP, 40 mg at 09/09/24 9047   vancomycin  (VANCOREADY) IVPB 750 mg/150 mL, 750 mg, Intravenous, Q12H, Regalado, Belkys A, MD, Last Rate: 150 mL/hr at 09/09/24 1011, 750 mg at 09/09/24 1011  Allergies: No Known Allergies  Majel GORMAN Ramp, FNP    Review of Systems  Unable to perform ROS: Patient unresponsive  Psychiatric/Behavioral:         Not able to assess

## 2024-09-09 NOTE — Progress Notes (Signed)
 [facility]                                                                              Ernest Romero, is a 58 y.o. male, DOB - 1966-09-05, FMW:[id number] Admit date - 07/15/2024    Outpatient Primary MD for the patient is [name]  LOS - 56  days  No chief complaint on file.      Brief summary   58 year old homeless, medical history significant for alcohol use disorder, bipolar and panic disorder, initially presented to [facility] from jail in September 2025 with a large subdural hematoma.  Underwent craniotomy with flap replacement on 10/27.  Since then multiple OR visits including 10/30 for SDH evacuation, follow-up CT head 11/8 showed fluid collection underneath the bone flap, back to the OR 11/10 and then bone flap was removed, cultures were obtained intraoperative growing MRSA, treating for infected bone flap, ID following and currently on IV vancomycin .   10/27 crani w/ flap replacement 10/29: off clevi, liberate SBP goal, Klonopin , lithium , Haldol  as needed discontinued.  Latuda  dose cut by half 10/30: repeat CT head with increased collection - going to OR this afternoon for evacuation 10/31: OR  for SDH evacuation.  Developed arm twitching.  Klonopin  restarted at half dose 11/1: More awake.  No complaint 11/2 transferred out of ICU. 11/4 cortrak removed 11/8 CT head showed fluid collection under bone flap, decision made to remove bone flap 11/10 to OR for L bone flap removal 11/11 repeat CT head stable 11/29: notice to have worsening Dysphagia.  12/1 repeat CT head: stable.  12/3: Cortrack placed for nutrition, sutures removed by neurosurgery.  Tube feeds started. 12/4: Very sedated, psychiatry requested to readjust meds, reduced clonazepam  to0.5mg  BID, decreased valbenazine  to 40 mg daily 12/5: Noted oriented but jaw tremors worse.  Evaluated by neurology, recommended psych to continue to follow and adjust medications   Assessment &  Plan    Subdural hematoma Status post initial craniotomy 9/28 (Dr. 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 - Followed by neurosurgery. -Evaluated by infectious disease who recommends 6 weeks of IV vancomycin , EOT 09/23/2024.  After completion of IV antibiotics he will need 8 weeks of doxycycline . - Will need PICC line placed closer to discharge date. - Completed 1 week of Keppra  -  sutures were removed without complication by neurosurgery on 12/3.  EVD sutures remain intact, no signs of drainage or erythema, recommended do not remove at this time    -Acute metabolic encephalopathy - At baseline, patient reportedly without cognitive impairment, was homeless and independent caring for self. - At some point, during this hospitalization patient was deemed not to have capacity, due to encephalopathy and neurological deficits. Per recent physicians notes patient appears to be able to make decision for himself moving forward. Continue to monitor, capacity could fluctuates.  -  Patient was too oversedated before and psychiatry readjusted medications to reduce clonazepam  and valbenazine .  Subsequently became alert, agitated with worsening jaw tremor - appreciate neurology, Dr Lindzen for evaluating  and recommendations, psych will need to continue following and make necessary adjustments to the regimen - Placed OT consult. Jaw tremors alleviates temporarily with distraction   -Was somewhat anxious and agitated on 12/7 AM, increased Klonopin  to 0.5 mg 3 times daily - Currently alert and oriented, having jaw tremors and wants to eat.  SLP eval. has core track.    Bipolar disorder/akathisia/drug-induced parkinsonism: - Patient with long standing history of bipolar disorder. - Patient was on Haldol  and Latuda , developed facial tremors 11/20 concern for tardive dyskinesia. - EEG negative for seizures. -  Latuda  and Haldol  discontinued. - Patient was started on propranolol  and Klonopin .  On Ingrezza  -Due to oversedation, Klonopin  was decreased to 0.5 mg twice daily (from 1 mg 3 times daily) and valbenazine  decreased to 40 mg daily on 12/4 -  appreciate neurology, Dr Lindzen for evaluating and recommendations, psych will need to continue following and make necessary adjustments to the regimen - Patient became anxious and agitated on 12/7 AM, trying to pull out his cortrack.  Klonopin  was increased to 0.5 TID, psychiatry following.  Defer further adjustments to psychiatry   Dysphagia 11/29: Overnight after medications given with water  patient felt something got stuck, he require suctioning.  -evaluated by speech. MBS: sign of aspiration.   CT head. Stable. - Speech therapy following, placed cortrack on 12/3 due to oversedation, tube feeds started - Repeat SLP evaluation as patient wants to eat, alert oriented and cooperative.   Aspiration PNA;  Acute hypoxic Resp failure.  -In setting of dysphagia and aspiration   - Completed 5 days of IV Unasyn    history of alcohol, tobacco use disorder: - Continue thiamine , folic acid  and multivitamin Treated for alcohol withdrawal delirium with phenobarbital  taper at [facility]. Resolved   Essential hypertension: - BP stable  Amlodipine  and losartan  has been discontinued. Propranolol  dose decreased to avoid hypotension   Constipation: Continue laxatives.    Bradycardia - Holding parameters on for propranolol     Severe malnutrition: Continue ensure.  -Started on tube feeds   Iron deficiency anemia; started  iron supplement.    Oral Thrush; started nystatin    Protein calorie malnutrition Nutrition Problem: Severe Malnutrition Etiology: acute illness Signs/Symptoms: energy intake < or equal to 50% for > or equal to 5 days, severe muscle depletion, moderate fat depletion Interventions: Tube feeding  Estimated body mass index is 20.85 kg/m as  calculated from the following:   Height as of this encounter: 5' 10 (1.778 m).   Weight as of this encounter: 65.9 kg.  Code Status: Full code DVT Prophylaxis:  heparin  injection 5,000 Units Start: 08/13/24 1400 SCDs Start: 08/12/24 1150 Place TED hose Start: 08/12/24 1150 SCDs Start: 08/01/24 1745 Place and maintain sequential compression device Start: 07/29/24 1819   Level of Care: Level of care: Med-Surg Family Communication:  Disposition Plan:      Remains inpatient appropriate: Awaiting placement, still has cortrack   Procedures:    Consultants:   Psych ID CCM Neurosurgery   Antimicrobials:   Anti-infectives (From admission, onward)    Start     Dose/Rate Route Frequency Ordered Stop   09/02/24 1700  Ampicillin -Sulbactam (UNASYN ) 3 g in sodium chloride  0.9 % 100 mL IVPB        3 g 200 mL/hr over 30 Minutes Intravenous Every 6 hours 09/02/24 1611 09/07/24 1230   08/31/24 1000  vancomycin  (VANCOREADY) IVPB 750 mg/150 mL        750 mg 150 mL/hr over 60 Minutes Intravenous Every  12 hours 08/31/24 0015     08/21/24 0800  vancomycin  (VANCOCIN ) IVPB 1000 mg/200 mL premix  Status:  Discontinued        1,000 mg 200 mL/hr over 60 Minutes Intravenous Every 12 hours 08/21/24 0720 08/31/24 0015   08/19/24 0600  vancomycin  (VANCOREADY) IVPB 1250 mg/250 mL  Status:  Discontinued        1,250 mg 166.7 mL/hr over 90 Minutes Intravenous Every 12 hours 08/18/24 1818 08/21/24 0547   08/18/24 1830  vancomycin  (VANCOREADY) IVPB 500 mg/100 mL        500 mg 100 mL/hr over 60 Minutes Intravenous  Once 08/18/24 1817 08/19/24 1642   08/16/24 1600  vancomycin  (VANCOREADY) IVPB 750 mg/150 mL  Status:  Discontinued        750 mg 150 mL/hr over 60 Minutes Intravenous Every 8 hours 08/16/24 0828 08/18/24 1818   08/14/24 0800  vancomycin  (VANCOCIN ) IVPB 1000 mg/200 mL premix  Status:  Discontinued        1,000 mg 200 mL/hr over 60 Minutes Intravenous Every 8 hours 08/14/24 0016 08/16/24 0828    08/14/24 0030  vancomycin  (VANCOCIN ) IVPB 1000 mg/200 mL premix        1,000 mg 200 mL/hr over 60 Minutes Intravenous NOW 08/14/24 0016 08/14/24 0135   08/13/24 0100  vancomycin  (VANCOREADY) IVPB 750 mg/150 mL  Status:  Discontinued        750 mg 150 mL/hr over 60 Minutes Intravenous Every 12 hours 08/12/24 1230 08/14/24 0007   08/12/24 1400  ceFAZolin  (ANCEF ) IVPB 2g/100 mL premix  Status:  Discontinued        2 g 200 mL/hr over 30 Minutes Intravenous Every 8 hours 08/12/24 1149 08/12/24 1230   08/12/24 1400  cefTAZidime  (FORTAZ ) 2 g in sodium chloride  0.9 % 100 mL IVPB  Status:  Discontinued        2 g 200 mL/hr over 30 Minutes Intravenous Every 8 hours 08/12/24 1152 08/13/24 1109   08/12/24 1245  ceFAZolin  (ANCEF ) IVPB 2g/100 mL premix  Status:  Discontinued        2 g 200 mL/hr over 30 Minutes Intravenous  Once 08/12/24 1149 08/12/24 1449   08/12/24 1245  vancomycin  (VANCOREADY) IVPB 1250 mg/250 mL        1,250 mg 166.7 mL/hr over 90 Minutes Intravenous  Once 08/12/24 1152 08/12/24 1501   08/12/24 0957  vancomycin  (VANCOCIN ) powder  Status:  Discontinued          As needed 08/12/24 1008 08/12/24 1037   08/01/24 2200  ceFAZolin  (ANCEF ) IVPB 2g/100 mL premix        2 g 200 mL/hr over 30 Minutes Intravenous Every 8 hours 08/01/24 1703 08/02/24 1915   08/01/24 1530  vancomycin  (VANCOCIN ) powder  Status:  Discontinued          As needed 08/01/24 1559 08/01/24 1606   08/01/24 1421  ceFAZolin  (ANCEF ) 2-4 GM/100ML-% IVPB       Note to Pharmacy: Roslynn Birmingham: cabinet override      08/01/24 1421 08/02/24 0229   07/29/24 2200  ceFAZolin  (ANCEF ) IVPB 2g/100 mL premix        2 g 200 mL/hr over 30 Minutes Intravenous Every 8 hours 07/29/24 1859 07/30/24 1423   07/29/24 1654  vancomycin  (VANCOCIN ) powder  Status:  Discontinued          As needed 07/29/24 1655 07/29/24 1758   07/29/24 1515  ceFAZolin  (ANCEF ) IVPB 2g/100 mL premix  2 g 200 mL/hr over 30 Minutes Intravenous  Once  07/29/24 1421 07/29/24 1655          Medications  Chlorhexidine  Gluconate Cloth  6 each Topical Daily   clonazePAM   0.5 mg Oral TID   docusate  100 mg Oral Daily   feeding supplement  237 mL Oral BID BM   feeding supplement (PROSource TF20)  60 mL Per Tube Daily   ferrous sulfate   325 mg Oral Q breakfast   folic acid   1 mg Oral Daily   free water   100 mL Per Tube Q4H   gabapentin   200 mg Oral TID   heparin  injection (subcutaneous)  5,000 Units Subcutaneous Q8H   mirtazapine   15 mg Oral QHS   multivitamin with minerals  1 tablet Oral Daily   nicotine   14 mg Transdermal Daily   nystatin   5 mL Oral QID   pantoprazole   40 mg Oral QHS   polyethylene glycol  17 g Oral Daily   propranolol   20 mg Oral BID   senna  2 tablet Oral Daily   sodium chloride  flush  10-40 mL Intracatheter Q12H   thiamine   100 mg Oral Daily   valbenazine   40 mg Oral Daily      Subjective:   Ernest Romero was seen and examined today.  Alert and oriented, having jaw tremors, wants to eat.  Still has cortrack   Objective:   Vitals:   09/09/24 0245 09/09/24 0437 09/09/24 0438 09/09/24 0819  BP: 102/63 109/76  133/86  Pulse: (!) 52 (!) 51  (!) 53  Resp: 18 18  20   Temp:  97.7 F (36.5 C)  (!) 97.4 F (36.3 C)  TempSrc:  Axillary  Oral  SpO2:  95%  96%  Weight:   65.9 kg   Height:        Intake/Output Summary (Last 24 hours) at 09/09/2024 1019 Last data filed at 09/09/2024 0800 Gross per 24 hour  Intake 610 ml  Output 2900 ml  Net -2290 ml     Wt Readings from Last 3 Encounters:  09/09/24 65.9 kg  07/15/24 67.5 kg  06/16/24 78.9 kg   Physical Exam General: Alert and oriented x 3, NAD, jaw tremors Cardiovascular: S1 S2 clear, RRR.  Respiratory: CTAB, no wheezing Gastrointestinal: Soft, nontender, nondistended, NBS Ext: no pedal edema bilaterally Neuro: no new deficits Psych: Normal affect     Data Reviewed:  I have personally reviewed following labs    CBC Lab Results   Component Value Date   WBC 2.8 (L) 09/08/2024   RBC 3.92 (L) 09/08/2024   HGB 11.5 (L) 09/08/2024   HCT 35.0 (L) 09/08/2024   MCV 89.3 09/08/2024   MCH 29.3 09/08/2024   PLT 195 09/08/2024   MCHC 32.9 09/08/2024   RDW 13.2 09/08/2024   LYMPHSABS 2.3 07/16/2024   MONOABS 0.7 07/16/2024   EOSABS 0.2 07/16/2024   BASOSABS 0.1 07/16/2024     Last metabolic panel Lab Results  Component Value Date   NA 142 09/08/2024   K 4.0 09/08/2024   CL 110 09/08/2024   CO2 22 09/08/2024   BUN 13 09/08/2024   CREATININE 0.79 09/08/2024   GLUCOSE 111 (H) 09/08/2024   GFRNONAA >60 09/08/2024   GFRAA >60 09/16/2018   CALCIUM 8.8 (L) 09/08/2024   PHOS 3.9 08/26/2024   PROT 6.1 (L) 08/28/2024   ALBUMIN  3.1 (L) 08/28/2024   BILITOT 0.7 08/28/2024   ALKPHOS 69 08/28/2024   AST 26  08/28/2024   ALT 40 08/28/2024   ANIONGAP 10 09/08/2024    CBG (last 3)  Recent Labs    09/08/24 2252 09/09/24 0435 09/09/24 0819  GLUCAP 139* 100* 109*      Coagulation Profile: No results for input(s): INR, PROTIME in the last 168 hours.   Radiology Studies: I have personally reviewed the imaging studies  No results found.      Nydia Distance M.D. [facility] 09/09/2024, 10:19 AM  Available via Epic secure chat 7am-7pm After 7 pm, please refer to night coverage provider listed on amion.

## 2024-09-09 NOTE — Progress Notes (Addendum)
 Nutrition Follow-up  DOCUMENTATION CODES:  Severe malnutrition in context of acute illness/injury  INTERVENTION:  Discontinue TF, water  flushes and ProSource. Continue Dysphagia 3 diet with thin liquids. Magic Cup TID. Each supplement provides 290 Kcals and 9 grams of protein. Continue Ensure Plus High Protein PO BID. Each supplement provides 350 Kcals and 20 grams of protein. Continue multivitamin, thiamine , iron, and folic acid  daily. Continue mirtazapine .  NUTRITION DIAGNOSIS:  Severe Malnutrition related to acute illness as evidenced by severe muscle depletion, moderate muscle depletion - remains applicable  GOAL:  Patient will meet greater than or equal to 90% of their needs - progressing  MONITOR:  PO intake, Supplement acceptance, Diet advancement, Labs, Weight trends  REASON FOR ASSESSMENT:  Follow-up for: Malnutrition Screening Tool, NPO/Clear Liquid Diet    ASSESSMENT:  PMH significant for ETOH abuse, HTN, bipolar disorder, anxiety/depression, hepatitis, craniotomy 06/30/24 for SDH during incarceration, presented on 10/13 for management of craniotomy s/p craniotomy with flap replacement 10/27.  09/28 admitted to [facility] with SDH s/p post prontoparietal craniotomy with evacuation 10/13 transferred to [facility] 10/27 s/p L allograft cranioplasty with flap replacement 10/29 Cortrak tube placed; xray with tip in descending duodenum  10/31 OR for SDH evacuation; diet advanced to Dysphagia 3; eating 75% of meals 11/02 Tube feeds discontinued  11/03 Transferred to progressive  11/04 Cortrak removed 11/10 OR for L bone flap removal, transferred to ICU 11/11 Transferred back to progressive  11/12 Continues to eat 75-100% meals, SLP passed patient for regular solids but patient prefers bite-sized Dys3 diet 11/25 Continued >75% meal intake charted 11/28 Diet changed to NPO due to coughing with medications 11/29 Failed MBS, SLP recommends continued NPO 12/01 Continues to fail swallow,  NPO continues, repeat CT head due to increased drowsiness - results benign 12/02 Failed SLP eval again 12/03 Failed repeat MBS; Cortrak placed  Update: SLP repeated MBS today and suspects Cortrak might be affecting patient's swallow. The patient is adamant that he wants to resume eating. SLP discussed with Dr Davia and RD and we agreed to Cortrak removal at this juncture. The patient is on Dysphagia 3 diet with thin liquids. Visited the patient who is about to eat his first meal. He is amenable to continue Ensure and would like [employer] as well. He states he has a great appetite and is very hungry.  Scheduled Meds:  benztropine   1 mg Oral BID   Chlorhexidine  Gluconate Cloth  6 each Topical Daily   clonazePAM   0.5 mg Oral TID   docusate  100 mg Oral Daily   feeding supplement  237 mL Oral BID BM   feeding supplement (PROSource TF20)  60 mL Per Tube Daily   ferrous sulfate   325 mg Oral Q breakfast   folic acid   1 mg Oral Daily   free water   100 mL Per Tube Q4H   gabapentin   200 mg Oral TID   heparin  injection (subcutaneous)  5,000 Units Subcutaneous Q8H   mirtazapine   15 mg Oral QHS   multivitamin with minerals  1 tablet Oral Daily   nicotine   14 mg Transdermal Daily   nystatin   5 mL Oral QID   pantoprazole   40 mg Oral QHS   polyethylene glycol  17 g Oral Daily   propranolol   20 mg Oral BID   senna  2 tablet Oral Daily   sodium chloride  flush  10-40 mL Intracatheter Q12H   thiamine   100 mg Oral Daily   valbenazine   40 mg Oral Daily  Continuous Infusions:  feeding supplement (OSMOLITE 1.5 CAL) 1,000 mL (09/07/24 2000)   lactated ringers  75 mL/hr at 09/09/24 0601   vancomycin  750 mg (09/09/24 1011)    Diet Order             DIET DYS 3 Room service appropriate? Yes with Assist; Fluid consistency: Thin  Diet effective now                  Meal Intake: N/A; was consistently 75-100% up until 11/28 NPO  Labs:     Latest Ref Rng & Units 09/08/2024    5:58 AM 09/05/2024     4:19 AM 09/03/2024    1:31 AM  CMP  Glucose 70 - 99 mg/dL 888  79  96   BUN 6 - 20 mg/dL 13  18  23    Creatinine 0.61 - 1.24 mg/dL 9.20  8.96  8.91   Sodium 135 - 145 mmol/L 142  143  140   Potassium 3.5 - 5.1 mmol/L 4.0  4.3  3.8   Chloride 98 - 111 mmol/L 110  110  106   CO2 22 - 32 mmol/L 22  22  25    Calcium 8.9 - 10.3 mg/dL 8.8  9.1  9.1     I/O: -11.8 L since admit  NUTRITION - FOCUSED PHYSICAL EXAM:  Flowsheet Row Most Recent Value  Orbital Region Mild depletion  Upper Arm Region Moderate depletion  Thoracic and Lumbar Region Mild depletion  Buccal Region Moderate depletion  Temple Region Moderate depletion  Clavicle Bone Region Severe depletion  Clavicle and Acromion Bone Region Severe depletion  Scapular Bone Region Moderate depletion  Dorsal Hand Mild depletion  Patellar Region Severe depletion  Anterior Thigh Region Severe depletion  Posterior Calf Region Severe depletion  Edema (RD Assessment) None  Hair Reviewed  Eyes Reviewed  Mouth Reviewed  Skin Reviewed  Nails Reviewed    EDUCATION NEEDS:  Education needs have been addressed  Skin:  Skin Assessment: Reviewed RN Assessment Skin Integrity Issues:: Incisions Incisions: -  Last BM:  12/6 type 7  Height:  Ht Readings from Last 1 Encounters:  08/12/24 5' 10 (1.778 m)   Weight:   Stable weight this admission  Weight Change: 12 Kg (15%) loss in 3 months  Edema: none  Ideal Body Weight:  75.5 kg   BMI:  Body mass index is 20.85 kg/m.  Estimated Nutritional Needs:  Kcal:  2100-2400 Protein:  110-130 Fluid:  2100-2400    Ernest Ruth, MS, RDN, LDN [place]. [facility] See AMION for contact information Secure chat preferred

## 2024-09-10 LAB — GLUCOSE, CAPILLARY
Glucose-Capillary: 102 mg/dL — ABNORMAL HIGH (ref 70–99)
Glucose-Capillary: 105 mg/dL — ABNORMAL HIGH (ref 70–99)
Glucose-Capillary: 116 mg/dL — ABNORMAL HIGH (ref 70–99)
Glucose-Capillary: 124 mg/dL — ABNORMAL HIGH (ref 70–99)
Glucose-Capillary: 94 mg/dL (ref 70–99)

## 2024-09-10 NOTE — Plan of Care (Signed)
  Problem: Education: Goal: Knowledge of General Education information will improve Description: Including pain rating scale, medication(s)/side effects and non-pharmacologic comfort measures Outcome: Progressing   Problem: Health Behavior/Discharge Planning: Goal: Ability to manage health-related needs will improve Outcome: Progressing   Problem: Clinical Measurements: Goal: Ability to maintain clinical measurements within normal limits will improve Outcome: Progressing Goal: Will remain free from infection Outcome: Progressing Goal: Diagnostic test results will improve Outcome: Progressing Goal: Respiratory complications will improve Outcome: Progressing Goal: Cardiovascular complication will be avoided Outcome: Progressing   Problem: Activity: Goal: Risk for activity intolerance will decrease Outcome: Progressing   Problem: Nutrition: Goal: Adequate nutrition will be maintained Outcome: Progressing   Problem: Coping: Goal: Level of anxiety will decrease Outcome: Progressing   Problem: Elimination: Goal: Will not experience complications related to bowel motility Outcome: Progressing Goal: Will not experience complications related to urinary retention Outcome: Progressing   Problem: Pain Managment: Goal: General experience of comfort will improve and/or be controlled Outcome: Progressing   Problem: Safety: Goal: Ability to remain free from injury will improve Outcome: Progressing   Problem: Skin Integrity: Goal: Risk for impaired skin integrity will decrease Outcome: Progressing   Problem: Education: Goal: Knowledge of the prescribed therapeutic regimen will improve Outcome: Progressing   Problem: Clinical Measurements: Goal: Usual level of consciousness will be regained or maintained. Outcome: Progressing Goal: Neurologic status will improve Outcome: Progressing Goal: Ability to maintain intracranial pressure will improve Outcome: Progressing    Problem: Skin Integrity: Goal: Demonstration of wound healing without infection will improve Outcome: Progressing

## 2024-09-10 NOTE — Progress Notes (Signed)
 Speech Language Pathology Treatment: Dysphagia  Patient Details Name: Ernest Romero MRN: [id number] DOB: 1966-02-21 Today's Date: 09/10/2024 Time: 8378-8354 SLP Time Calculation (min) (ACUTE ONLY): 24 min  Assessment / Plan / Recommendation Clinical Impression  Pt independently recalls and initiates use of a L head turn. He brushed his teeth and fed himself bite-sized solids and thin liquids with infrequent delayed coughing. Coughing seemed to occur most often in the absence of POs. RN arrived during session to provide meds, which pt took whole with applesauce without coughing. Reinforced recommendation to crush meds when able and to always give them in puree rather than with liquids. Discussed using oral care as a tool to minimize occurrence of potential pneumonia. Also introduced EMST with pt completing 10 reps set to 10 cm/H2O. Encouraged him to complete sets of 10 three times/day. Continue current diet and SLP will f/u to target goals related to dysphagia and cognition.    HPI HPI: Ernest Romero is a 58 y.o. male who was admitted to [facility] 06/30/24 after an unresponsive episode in jail. Dx large SDH; underwent left frontoparietal craniotomy with evacuation of SDH and drain placement 9/28 (drain removed 9/29). Transferred to [facility] for management of crani/flap 10/14. Cranioplasty with bone flap replacement 10/27; worsening neuro with midline shift; underwent  left burr hole for evacuation 10/30. Bone flap removal on 11/10.  Bedside swallow eval 10/28 after change in mentaion and acute change in swallowing; NPO, then advanced to dysphagia 3/thin liquids 10/31.  New orders 11/18 due to coughing with POs - rec continued Dys3/thin liquids. Repeat orders 11/28 due to acute change again in swallow. MBS completed on 11/29 - severe oropharyngeal dysphagia with aspiration of all liquids and puree solids; rec NPO. Repeat MBS 12/4- no significant change. Cortrak placed. [facility] 12/1 with evolving postoperative changes from  left sided hemicraniectomy and bone flap removal without evidence of an acute intracranial abnormality or significant residual subdural collection.      SLP Plan  Continue with current plan of care        Swallow Evaluation Recommendations   Recommendations: PO diet PO Diet Recommendation: Dysphagia 3 (Mechanical soft);Thin liquids (Level 0) Liquid Administration via: Cup;Straw Medication Administration: Crushed with puree (when able, otherwise can give whole with puree) Supervision: Full supervision/cueing for swallowing strategies;Patient able to self-feed Postural changes: Position pt fully upright for meals Oral care recommendations: Oral care QID (4x/day);Oral care before PO     Recommendations                         Frequent or constant Supervision/Assistance Dysphagia, oropharyngeal phase (R13.12)     Continue with current plan of care     Damien Blumenthal, M.A., CCC-SLP Speech Language Pathology, Acute Rehabilitation Services  Secure Chat preferred [phone]   09/10/2024, 4:57 PM

## 2024-09-10 NOTE — TOC Progression Note (Signed)
 Transition of Care [facility]) - Progression Note    Patient Details  Name: Ernest Romero MRN: [id number] Date of Birth: 24-Oct-1965  [facility] CM/SW Contact  Rosaline JONELLE Joe, RN Phone Number: 09/10/2024, 2:38 PM  Clinical Narrative:    Cortrak removed and diet ordered by MD for the patient.  Patient with no current bed offers at this time.  CM and MSW will continue to follow the patient for needed SNF placement.   Expected Discharge Plan: Skilled Nursing Facility Barriers to Discharge: Continued Medical Work up               Expected Discharge Plan and Services   Discharge Planning Services: CM Consult                                           Social Drivers of Health (SDOH) Interventions SDOH Screenings   Food Insecurity: Patient Unable To Answer (07/16/2024)  Housing: Unknown (07/16/2024)  Transportation Needs: Patient Unable To Answer (07/16/2024)  Utilities: Patient Unable To Answer (07/16/2024)  Alcohol Screen: Low Risk  (08/30/2022)  Depression (PHQ2-9): Medium Risk (08/30/2022)  Tobacco Use: Medium Risk (08/12/2024)    Readmission Risk Interventions    07/16/2024   12:23 PM  Readmission Risk Prevention Plan  Transportation Screening Complete  Medication Review (RN Care Manager) Complete  PCP or Specialist appointment within 3-5 days of discharge Complete  HRI or Home Care Consult Complete  SW Recovery Care/Counseling Consult Complete  Palliative Care Screening Not Applicable  Skilled Nursing Facility Not Applicable

## 2024-09-10 NOTE — Progress Notes (Signed)
 [facility]                                                                              Ernest Romero, is a 58 y.o. male, DOB - [birth year], FMW:[id number] Admit date - 07/15/2024    Outpatient Primary MD for the patient is [name]  LOS - 57  days  No chief complaint on file.      Brief summary   58 year old homeless, medical history significant for alcohol use disorder, bipolar and panic disorder, initially presented to [facility] from jail in September 2025 with a large subdural hematoma.  Underwent craniotomy with flap replacement on 10/27.  Since then multiple OR visits including 10/30 for SDH evacuation, follow-up CT head 11/8 showed fluid collection underneath the bone flap, back to the OR 11/10 and then bone flap was removed, cultures were obtained intraoperative growing MRSA, treating for infected bone flap, ID following and currently on IV vancomycin .   10/27 crani w/ flap replacement 10/29: off clevi, liberate SBP goal, Klonopin , lithium , Haldol  as needed discontinued.  Latuda  dose cut by half 10/30: repeat CT head with increased collection - going to OR this afternoon for evacuation 10/31: OR  for SDH evacuation.  Developed arm twitching.  Klonopin  restarted at half dose 11/1: More awake.  No complaint 11/2 transferred out of ICU. 11/4 cortrak removed 11/8 CT head showed fluid collection under bone flap, decision made to remove bone flap 11/10 to OR for L bone flap removal 11/11 repeat CT head stable 11/29: notice to have worsening Dysphagia.  12/1 repeat CT head: stable.  12/3: Cortrack placed for nutrition, sutures removed by neurosurgery.  Tube feeds started. 12/4: Very sedated, psychiatry requested to readjust meds, reduced clonazepam  to0.5mg  BID, decreased valbenazine  to 40 mg daily 12/5: Noted oriented but jaw tremors worse.  Evaluated by neurology, recommended psych to continue to follow and adjust medications   Assessment &  Plan    Subdural hematoma Status post initial craniotomy 9/28 (Dr. 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 - Followed by neurosurgery. -Evaluated by infectious disease who recommends 6 weeks of IV vancomycin , EOT 09/23/2024.  After completion of IV antibiotics he will need 8 weeks of doxycycline . - Will need PICC line placed closer to discharge date. - Completed 1 week of Keppra  -  sutures were removed without complication by neurosurgery on 12/3.  EVD sutures remain intact, no signs of drainage or erythema, recommended do not remove at this time    -Acute metabolic encephalopathy - At baseline, patient reportedly without cognitive impairment, was homeless and independent caring for self. - At some point, during this hospitalization patient was deemed not to have capacity, due to encephalopathy and neurological deficits. Per recent physicians notes patient appears to be able to make decision for himself moving forward. Continue to monitor, capacity could fluctuates.  -  Patient was too oversedated before and psychiatry readjusted medications to reduce clonazepam  and valbenazine .  Subsequently became alert, agitated with worsening jaw tremor - appreciate neurology, Dr Lindzen for evaluating  and recommendations, psych will need to continue following and make necessary adjustments to the regimen - Placed OT consult. Jaw tremors alleviates temporarily with distraction   -Was somewhat anxious and agitated on 12/7 AM, increased Klonopin  to 0.5 mg 3 times daily - Currently alert and oriented, cortrack removed on 12/8, patient tolerating diet without difficulty, appears close to his baseline mental status.    Bipolar disorder/akathisia/drug-induced parkinsonism: - Patient with long standing history of bipolar disorder. - Patient was on Haldol  and Latuda , developed facial tremors 11/20 concern for  tardive dyskinesia. - EEG negative for seizures. - Latuda  and Haldol  discontinued. - Patient was started on propranolol  and Klonopin .  On Ingrezza  -Due to oversedation, Klonopin  was decreased to 0.5 mg twice daily (from 1 mg 3 times daily) and valbenazine  decreased to 40 mg daily on 12/4 -  appreciate neurology, Dr Lindzen for evaluating and recommendations, psych will need to continue following and make necessary adjustments to the regimen - Currently alert and oriented, management per psych   Dysphagia 11/29: Overnight after medications given with water  patient felt something got stuck, he require suctioning.  -evaluated by speech. MBS: sign of aspiration.   CT head. Stable. - Alert and oriented, core track removed on 12/8, tolerating diet and meds.   Aspiration PNA;  Acute hypoxic Resp failure.  -In setting of dysphagia and aspiration   - Completed 5 days of IV Unasyn    history of alcohol, tobacco use disorder: - Continue thiamine , folic acid  and multivitamin -Treated for alcohol withdrawal delirium with phenobarbital  taper at [facility]. Resolved   Essential hypertension: - BP stable  Amlodipine  and losartan  has been discontinued. Propranolol  dose decreased to avoid hypotension   Constipation: Continue laxatives.    Bradycardia - Holding parameters on for propranolol     Severe malnutrition: Continue ensure.  -Started on tube feeds   Iron deficiency anemia; started  iron supplement.    Oral Thrush; started nystatin    Protein calorie malnutrition Nutrition Problem: Severe Malnutrition Etiology: acute illness Signs/Symptoms: severe muscle depletion, moderate muscle depletion Interventions: Ensure Enlive (each supplement provides 350kcal and 20 grams of protein), MVI  Estimated body mass index is 20.85 kg/m as calculated from the following:   Height as of this encounter: 5' 10 (1.778 m).   Weight as of this encounter: 65.9 kg.  Code Status: Full code DVT Prophylaxis:   heparin  injection 5,000 Units Start: 08/13/24 1400 SCDs Start: 08/12/24 1150 Place TED hose Start: 08/12/24 1150 SCDs Start: 08/01/24 1745 Place and maintain sequential compression device Start: 07/29/24 1819   Level of Care: Level of care: Med-Surg Family Communication:  Disposition Plan:      Remains inpatient appropriate: Awaiting placement, expressing to go to rehab   Procedures:    Consultants:   Psych ID CCM Neurosurgery   Antimicrobials:   Anti-infectives (From admission, onward)    Start     Dose/Rate Route Frequency Ordered Stop   09/02/24 1700  Ampicillin -Sulbactam (UNASYN ) 3 g in sodium chloride  0.9 % 100 mL IVPB        3 g 200 mL/hr over 30 Minutes Intravenous Every 6 hours 09/02/24 1611 09/07/24 1230   08/31/24 1000  vancomycin  (VANCOREADY) IVPB 750 mg/150 mL        750 mg 150 mL/hr over 60 Minutes Intravenous Every 12 hours 08/31/24 0015     08/21/24 0800  vancomycin  (VANCOCIN ) IVPB 1000 mg/200 mL premix  Status:  Discontinued        1,000 mg 200 mL/hr over 60  Minutes Intravenous Every 12 hours 08/21/24 0720 08/31/24 0015   08/19/24 0600  vancomycin  (VANCOREADY) IVPB 1250 mg/250 mL  Status:  Discontinued        1,250 mg 166.7 mL/hr over 90 Minutes Intravenous Every 12 hours 08/18/24 1818 08/21/24 0547   08/18/24 1830  vancomycin  (VANCOREADY) IVPB 500 mg/100 mL        500 mg 100 mL/hr over 60 Minutes Intravenous  Once 08/18/24 1817 08/19/24 1642   08/16/24 1600  vancomycin  (VANCOREADY) IVPB 750 mg/150 mL  Status:  Discontinued        750 mg 150 mL/hr over 60 Minutes Intravenous Every 8 hours 08/16/24 0828 08/18/24 1818   08/14/24 0800  vancomycin  (VANCOCIN ) IVPB 1000 mg/200 mL premix  Status:  Discontinued        1,000 mg 200 mL/hr over 60 Minutes Intravenous Every 8 hours 08/14/24 0016 08/16/24 0828   08/14/24 0030  vancomycin  (VANCOCIN ) IVPB 1000 mg/200 mL premix        1,000 mg 200 mL/hr over 60 Minutes Intravenous NOW 08/14/24 0016 08/14/24 0135    08/13/24 0100  vancomycin  (VANCOREADY) IVPB 750 mg/150 mL  Status:  Discontinued        750 mg 150 mL/hr over 60 Minutes Intravenous Every 12 hours 08/12/24 1230 08/14/24 0007   08/12/24 1400  ceFAZolin  (ANCEF ) IVPB 2g/100 mL premix  Status:  Discontinued        2 g 200 mL/hr over 30 Minutes Intravenous Every 8 hours 08/12/24 1149 08/12/24 1230   08/12/24 1400  cefTAZidime  (FORTAZ ) 2 g in sodium chloride  0.9 % 100 mL IVPB  Status:  Discontinued        2 g 200 mL/hr over 30 Minutes Intravenous Every 8 hours 08/12/24 1152 08/13/24 1109   08/12/24 1245  ceFAZolin  (ANCEF ) IVPB 2g/100 mL premix  Status:  Discontinued        2 g 200 mL/hr over 30 Minutes Intravenous  Once 08/12/24 1149 08/12/24 1449   08/12/24 1245  vancomycin  (VANCOREADY) IVPB 1250 mg/250 mL        1,250 mg 166.7 mL/hr over 90 Minutes Intravenous  Once 08/12/24 1152 08/12/24 1501   08/12/24 0957  vancomycin  (VANCOCIN ) powder  Status:  Discontinued          As needed 08/12/24 1008 08/12/24 1037   08/01/24 2200  ceFAZolin  (ANCEF ) IVPB 2g/100 mL premix        2 g 200 mL/hr over 30 Minutes Intravenous Every 8 hours 08/01/24 1703 08/02/24 1915   08/01/24 1530  vancomycin  (VANCOCIN ) powder  Status:  Discontinued          As needed 08/01/24 1559 08/01/24 1606   08/01/24 1421  ceFAZolin  (ANCEF ) 2-4 GM/100ML-% IVPB       Note to Pharmacy: Roslynn Birmingham: cabinet override      08/01/24 1421 08/02/24 0229   07/29/24 2200  ceFAZolin  (ANCEF ) IVPB 2g/100 mL premix        2 g 200 mL/hr over 30 Minutes Intravenous Every 8 hours 07/29/24 1859 07/30/24 1423   07/29/24 1654  vancomycin  (VANCOCIN ) powder  Status:  Discontinued          As needed 07/29/24 1655 07/29/24 1758   07/29/24 1515  ceFAZolin  (ANCEF ) IVPB 2g/100 mL premix        2 g 200 mL/hr over 30 Minutes Intravenous  Once 07/29/24 1421 07/29/24 1655          Medications  benztropine   1 mg Oral BID  Chlorhexidine  Gluconate Cloth  6 each Topical Daily   clonazePAM   0.5 mg  Oral TID   docusate  100 mg Oral Daily   feeding supplement  237 mL Oral BID BM   ferrous sulfate   325 mg Oral Q breakfast   folic acid   1 mg Oral Daily   gabapentin   200 mg Oral TID   heparin  injection (subcutaneous)  5,000 Units Subcutaneous Q8H   mirtazapine   15 mg Oral QHS   multivitamin with minerals  1 tablet Oral Daily   nicotine   14 mg Transdermal Daily   nystatin   5 mL Oral QID   pantoprazole   40 mg Oral QHS   polyethylene glycol  17 g Oral Daily   propranolol   20 mg Oral BID   senna  2 tablet Oral Daily   sodium chloride  flush  10-40 mL Intracatheter Q12H   thiamine   100 mg Oral Daily   valbenazine   40 mg Oral Daily      Subjective:   Ernest Romero was seen and examined today.  Feels better today, alert and oriented, wants to go to rehab.  Cortrack removed, tolerated breakfast without difficulty, taking meds p.o. no acute events overnight.   Objective:   Vitals:   09/09/24 2135 09/10/24 0032 09/10/24 0400 09/10/24 0500  BP: (!) 145/97 (!) 121/90 111/79   Pulse: 68 (!) 56 (!) 51   Resp:  20 20   Temp:  (!) 97.4 F (36.3 C) (!) 97.5 F (36.4 C)   TempSrc:  Oral    SpO2:  95% 96%   Weight:    65.9 kg  Height:        Intake/Output Summary (Last 24 hours) at 09/10/2024 1034 Last data filed at 09/10/2024 0535 Gross per 24 hour  Intake 710 ml  Output 1600 ml  Net -890 ml     Wt Readings from Last 3 Encounters:  09/10/24 65.9 kg  07/15/24 67.5 kg  06/16/24 78.9 kg    Physical Exam General: Alert and oriented x 3, NAD, jaw tremor improving Cardiovascular: S1 S2 clear, RRR.  Respiratory: CTAB, no wheezing Gastrointestinal: Soft, nontender, nondistended, NBS Ext: no pedal edema bilaterally Neuro: no new deficits Psych: Normal affect   Data Reviewed:  I have personally reviewed following labs    CBC Lab Results  Component Value Date   WBC 2.8 (L) 09/08/2024   RBC 3.92 (L) 09/08/2024   HGB 11.5 (L) 09/08/2024   HCT 35.0 (L) 09/08/2024   MCV 89.3  09/08/2024   MCH 29.3 09/08/2024   PLT 195 09/08/2024   MCHC 32.9 09/08/2024   RDW 13.2 09/08/2024   LYMPHSABS 2.3 07/16/2024   MONOABS 0.7 07/16/2024   EOSABS 0.2 07/16/2024   BASOSABS 0.1 07/16/2024     Last metabolic panel Lab Results  Component Value Date   NA 142 09/08/2024   K 4.0 09/08/2024   CL 110 09/08/2024   CO2 22 09/08/2024   BUN 13 09/08/2024   CREATININE 0.79 09/08/2024   GLUCOSE 111 (H) 09/08/2024   GFRNONAA >60 09/08/2024   GFRAA >60 09/16/2018   CALCIUM 8.8 (L) 09/08/2024   PHOS 3.9 08/26/2024   PROT 6.1 (L) 08/28/2024   ALBUMIN  3.1 (L) 08/28/2024   BILITOT 0.7 08/28/2024   ALKPHOS 69 08/28/2024   AST 26 08/28/2024   ALT 40 08/28/2024   ANIONGAP 10 09/08/2024    CBG (last 3)  Recent Labs    09/09/24 2026 09/10/24 0038 09/10/24 0402  GLUCAP 118* 105*  102*      Coagulation Profile: No results for input(s): INR, PROTIME in the last 168 hours.   Radiology Studies: I have personally reviewed the imaging studies  DG Swallowing Func-Speech Pathology Result Date: 09/09/2024 Table formatting from the original result was not included. Modified Barium Swallow Study Patient Details Name: Ernest Romero MRN: [id number] Date of Birth: Nov 27, 1965 Today's Date: 09/09/2024 HPI/PMH: HPI: Ernest Romero is a 58 y.o. male who was admitted to [facility] 06/30/24 after an unresponsive episode in jail. Dx large SDH; underwent left frontoparietal craniotomy with evacuation of SDH and drain placement 9/28 (drain removed 9/29). Transferred to [facility] for management of crani/flap 10/14. Cranioplasty with bone flap replacement 10/27; worsening neuro with midline shift; underwent  left burr hole for evacuation 10/30. Bone flap removal on 11/10.  Bedside swallow eval 10/28 after change in mentaion and acute change in swallowing; NPO, then advanced to dysphagia 3/thin liquids 10/31.  New orders 11/18 due to coughing with POs - rec continued Dys3/thin liquids. Repeat orders 11/28 due to acute  change again in swallow. MBS completed on 11/29 - severe oropharyngeal dysphagia with aspiration of all liquids and puree solids; rec NPO. Repeat MBS 12/4- no significant change. Cortrak placed. [facility] 12/1 with evolving postoperative changes from left sided hemicraniectomy and bone flap removal without evidence of an acute intracranial abnormality or significant residual subdural collection. Clinical Impression: Ernest Romero' swallow has improved, however, he continues to have occasional aspiration of POs. This appears to be related to a too-tight UES.  Aspiration occurred infrequently and after the swallow when material sitting within the pharyngo-esophageal segment was squeezed upward then spilled over the arytenoids and into the trachea.  A head turn to the left appeared to improve bolus flow through the PES.  Overall, there was improved laryngeal mobility and epiglottic inversion. Pt was attentive and able to follow commands.  He is adamant that he wants to resume eating.  Spoke with Dr. Davia and RD, Leverne Ruth. There may be some benefit to removing the cortrak and allowing more space for bolus flow.  They agreed with plan.  Recommend starting a dysphagia 3 diet; thin liquids.  Turn head to the left when swallowing. If possible, crush meds in puree. Otherwise give with applesauce with head turned to the left. Even in the presence of aspiration, we can stave off potential pna by ensuring pt gets frequent oral care and that he is being mobilized regularly. He should eat meals in recliner when possible. SLP will follow for overall toleration/education. Factors that may increase risk of adverse event in presence of aspiration Noe & Lianne 2021): Factors that may increase risk of adverse event in presence of aspiration Noe & Lianne 2021): Limited mobility; Frail or deconditioned; Inadequate oral hygiene; Weak cough; Aspiration of thick, dense, and/or acidic materials Recommendations/Plan: Swallowing Evaluation  Recommendations Swallowing Evaluation Recommendations Recommendations: PO diet PO Diet Recommendation: Dysphagia 3 (Mechanical soft); Thin liquids (Level 0) Liquid Administration via: Cup; Straw Medication Administration: Crushed with puree (when able. Otherwise give whole with puree) Supervision: Full supervision/cueing for swallowing strategies Swallowing strategies  : effortful swallow; Head turn left during swallowing Postural changes: Position pt fully upright for meals Oral care recommendations: Oral care QID (4x/day) Treatment Plan Treatment Plan Treatment recommendations: Therapy as outlined in treatment plan below Functional status assessment: Patient has had a recent decline in their functional status and demonstrates the ability to make significant improvements in function in a reasonable and predictable amount of time. Treatment frequency:  Min 3x/week Treatment duration: 2 weeks Interventions: Trials of upgraded texture/liquids; Diet toleration management by SLP; Patient/family education Recommendations Recommendations for follow up therapy are one component of a multi-disciplinary discharge planning process, led by the attending physician.  Recommendations may be updated based on patient status, additional functional criteria and insurance authorization. Assessment: Orofacial Exam: Orofacial Exam Oral Cavity - Dentition: Poor condition; Missing dentition Anatomy: Anatomy: WFL Boluses Administered: Boluses Administered Boluses Administered: Thin liquids (Level 0); Mildly thick liquids (Level 2, nectar thick); Puree; Solid  Oral Impairment Domain: Oral Impairment Domain Lip Closure: No labial escape Tongue control during bolus hold: Cohesive bolus between tongue to palatal seal Bolus preparation/mastication: Slow prolonged chewing/mashing with complete recollection Bolus transport/lingual motion: Brisk tongue motion Oral residue: Trace residue lining oral structures Initiation of pharyngeal swallow :  Valleculae  Pharyngeal Impairment Domain: Pharyngeal Impairment Domain Soft palate elevation: No bolus between soft palate (SP)/pharyngeal wall (PW) Laryngeal elevation: Complete superior movement of thyroid cartilage with complete approximation of arytenoids to epiglottic petiole Anterior hyoid excursion: Complete anterior movement Epiglottic movement: Complete inversion Laryngeal vestibule closure: Complete, no air/contrast in laryngeal vestibule Pharyngeal stripping wave : Present - complete Pharyngeal contraction (A/P view only): N/A Pharyngoesophageal segment opening: Minimal distention/minimal duration, marked obstruction of flow Tongue base retraction: Trace column of contrast or air between tongue base and PPW Pharyngeal residue: Collection of residue within or on pharyngeal structures Location of pharyngeal residue: Valleculae  Esophageal Impairment Domain: No data recorded Pill: Pill Consistency administered: -- (NT) Penetration/Aspiration Scale Score: Penetration/Aspiration Scale Score 1.  Material does not enter airway: Puree; Solid 7.  Material enters airway, passes BELOW cords and not ejected out despite cough attempt by patient: Thin liquids (Level 0); Mildly thick liquids (Level 2, nectar thick) Compensatory Strategies: Compensatory Strategies Compensatory strategies: Yes Left head turn: Effective Effective Left Head Turn: Thin liquid (Level 0)   General Information: Caregiver present: No  Diet Prior to this Study: NPO   Temperature : Normal   Respiratory Status: WFL   Supplemental O2: None (Room air)   History of Recent Intubation: No  Behavior/Cognition: Alert; Cooperative; Pleasant mood No data recorded Baseline vocal quality/speech: Normal Volitional Cough: Able to elicit Volitional Swallow: Able to elicit Exam Limitations: No limitations Goal Planning: Prognosis for improved oropharyngeal function: Good No data recorded No data recorded No data recorded Consulted and agree with results and  recommendations: Patient; Physician; Dietitian Pain: Pain Assessment Pain Assessment: No/denies pain End of Session: Start Time:SLP Start Time (ACUTE ONLY): 1420 Stop Time: SLP Stop Time (ACUTE ONLY): 1450 Time Calculation:SLP Time Calculation (min) (ACUTE ONLY): 30 min Charges: SLP Evaluations $ SLP Speech Visit: 1 Visit SLP Evaluations $MBS Swallow: 1 Procedure SLP visit diagnosis: SLP Visit Diagnosis: Dysphagia, oropharyngeal phase (R13.12) Past Medical History: Past Medical History: Diagnosis Date  Anxiety   Bipolar 1 disorder (HCC)   Depression   Hepatitis   Hypertension   MRSA (methicillin resistant Staphylococcus aureus)   5/25 arm abscess and 11/25 Past Surgical History: Past Surgical History: Procedure Laterality Date  BURR HOLE N/A 08/01/2024  Procedure: CREATION, CRANIAL BURR HOLE WITH EVACUATION OF HEMATOMA;  Surgeon: Rosslyn Dino HERO, MD;  Location: [facility] OR;  Service: Neurosurgery;  Laterality: N/A;  BURR HOLE N/A 08/12/2024  Procedure: left bone flap removal;  Surgeon: Rosslyn Dino HERO, MD;  Location: [facility] OR;  Service: Neurosurgery;  Laterality: N/A;  CRANIOPLASTY, WITH BONE FLAP REPLACEMENT Left 07/29/2024  Procedure: CRANIOPLASTY, WITH BONE FLAP REPLACEMENT;  Surgeon: Rosslyn Dino HERO, MD;  Location: [facility] OR;  Service: Neurosurgery;  Laterality: Left;  LEFT CRANIOPLASTY WITH ARTIFICAL BONE FLAP REPLACEMENT  CRANIOTOMY Left 06/30/2024  Procedure: CRANIOTOMY HEMATOMA EVACUATION SUBDURAL;  Surgeon: Melonie Grass, MD;  Location: [facility] ORS;  Service: Neurosurgery;  Laterality: Left; Vona Alan Bradford 09/09/2024, 3:30 PM       Nydia Distance M.D. [facility] 09/10/2024, 10:34 AM  Available via Epic secure chat 7am-7pm After 7 pm, please refer to night coverage provider listed on amion.

## 2024-09-11 LAB — GLUCOSE, CAPILLARY
Glucose-Capillary: 104 mg/dL — ABNORMAL HIGH (ref 70–99)
Glucose-Capillary: 118 mg/dL — ABNORMAL HIGH (ref 70–99)

## 2024-09-11 LAB — CBC
HCT: 39.1 % (ref 39.0–52.0)
Hemoglobin: 12.6 g/dL — ABNORMAL LOW (ref 13.0–17.0)
MCH: 29.4 pg (ref 26.0–34.0)
MCHC: 32.2 g/dL (ref 30.0–36.0)
MCV: 91.1 fL (ref 80.0–100.0)
Platelets: 202 K/uL (ref 150–400)
RBC: 4.29 MIL/uL (ref 4.22–5.81)
RDW: 13.4 % (ref 11.5–15.5)
WBC: 4.1 K/uL (ref 4.0–10.5)
nRBC: 0 % (ref 0.0–0.2)

## 2024-09-11 LAB — BASIC METABOLIC PANEL WITH GFR
Anion gap: 9 (ref 5–15)
BUN: 18 mg/dL (ref 6–20)
CO2: 25 mmol/L (ref 22–32)
Calcium: 9.6 mg/dL (ref 8.9–10.3)
Chloride: 109 mmol/L (ref 98–111)
Creatinine, Ser: 1.07 mg/dL (ref 0.61–1.24)
GFR, Estimated: >60 mL/min (ref >60)
Glucose, Bld: 128 mg/dL — ABNORMAL HIGH (ref 70–99)
Potassium: 4.2 mmol/L (ref 3.5–5.1)
Sodium: 143 mmol/L (ref 135–145)

## 2024-09-11 NOTE — Plan of Care (Signed)
  Problem: Education: Goal: Knowledge of General Education information will improve Description: Including pain rating scale, medication(s)/side effects and non-pharmacologic comfort measures Outcome: Progressing   Problem: Health Behavior/Discharge Planning: Goal: Ability to manage health-related needs will improve Outcome: Progressing   Problem: Clinical Measurements: Goal: Ability to maintain clinical measurements within normal limits will improve Outcome: Progressing Goal: Will remain free from infection Outcome: Progressing Goal: Diagnostic test results will improve Outcome: Progressing Goal: Respiratory complications will improve Outcome: Progressing Goal: Cardiovascular complication will be avoided Outcome: Progressing   Problem: Activity: Goal: Risk for activity intolerance will decrease Outcome: Progressing   Problem: Nutrition: Goal: Adequate nutrition will be maintained Outcome: Progressing   Problem: Coping: Goal: Level of anxiety will decrease Outcome: Progressing   Problem: Elimination: Goal: Will not experience complications related to bowel motility Outcome: Progressing Goal: Will not experience complications related to urinary retention Outcome: Progressing   Problem: Pain Managment: Goal: General experience of comfort will improve and/or be controlled Outcome: Progressing   Problem: Safety: Goal: Ability to remain free from injury will improve Outcome: Progressing   Problem: Skin Integrity: Goal: Risk for impaired skin integrity will decrease Outcome: Progressing   Problem: Education: Goal: Knowledge of the prescribed therapeutic regimen will improve Outcome: Progressing   Problem: Clinical Measurements: Goal: Usual level of consciousness will be regained or maintained. Outcome: Progressing Goal: Neurologic status will improve Outcome: Progressing Goal: Ability to maintain intracranial pressure will improve Outcome: Progressing    Problem: Skin Integrity: Goal: Demonstration of wound healing without infection will improve Outcome: Progressing

## 2024-09-11 NOTE — Progress Notes (Signed)
 PROGRESS NOTE    Ernest Romero  FMW:[id number] DOB: [birth year] DOA: 07/15/2024 PCP: [name]   Brief Narrative:  58 year old homeless, medical history significant for alcohol use disorder, bipolar and panic disorder, initially presented to [facility] from jail in September 2025 with a large subdural hematoma.  Underwent craniotomy with flap replacement on 10/27.  Since then multiple OR visits including 10/30 for SDH evacuation, follow-up CT head 11/8 showed fluid collection underneath the bone flap, back to the OR 11/10 and then bone flap was removed, cultures were obtained intraoperative growing MRSA, treating for infected bone flap, ID following and currently on IV vancomycin .   10/27 crani w/ flap replacement 10/29: off clevi, liberate SBP goal, Klonopin , lithium , Haldol  as needed discontinued.  Latuda  dose cut by half 10/30: repeat CT head with increased collection - going to OR this afternoon for evacuation 10/31: OR  for SDH evacuation.  Developed arm twitching.  Klonopin  restarted at half dose 11/1: More awake.  No complaint 11/2 transferred out of ICU. 11/4 cortrak removed 11/8 CT head showed fluid collection under bone flap, decision made to remove bone flap 11/10 to OR for L bone flap removal 11/11 repeat CT head stable 11/29: notice to have worsening Dysphagia.  12/1 repeat CT head: stable.  12/3: Cortrack placed for nutrition, sutures removed by neurosurgery.  Tube feeds started. 12/4: Very sedated, psychiatry requested to readjust meds, reduced clonazepam  to0.5mg  BID, decreased valbenazine  to 40 mg daily 12/5: Noted oriented but jaw tremors worse.  Evaluated by neurology, recommended psych to continue to follow and adjust medications  Assessment & Plan:   Principal Problem:   Subdural hematoma (HCC) Active Problems:   Bipolar disorder (HCC)   Unable to make decisions about medical treatment due to impaired mental capacity   Alcohol withdrawal  (HCC)   Essential hypertension   Protein-calorie malnutrition, severe   Acute metabolic encephalopathy   Infection of craniotomy plate  Subdural hematoma /  Status post initial craniotomy 9/28 (Dr. 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 - Followed by neurosurgery. -Evaluated by infectious disease who recommends 6 weeks of IV vancomycin , EOT 09/23/2024.  After completion of IV antibiotics he will need 8 weeks of doxycycline . - Will need PICC line placed closer to discharge date. - Completed 1 week of Keppra  -  sutures were removed without complication by neurosurgery on 12/3.  EVD sutures remain intact, no signs of drainage or erythema, recommended do not remove at this time     -Acute metabolic encephalopathy - At baseline, patient reportedly without cognitive impairment, was homeless and independent caring for self. - At some point, during this hospitalization patient was deemed not to have capacity, due to encephalopathy and neurological deficits. Per recent physicians notes patient appears to be able to make decision for himself moving forward. Continue to monitor, capacity could fluctuates.  -  Patient was too oversedated before and psychiatry readjusted medications to reduce clonazepam  and valbenazine .  Subsequently became alert, agitated with worsening jaw tremor - appreciate neurology, Dr Lindzen for evaluating and recommendations, psych will need to continue following and make necessary adjustments to the regimen - Placed OT consult. Jaw tremors alleviates temporarily with distraction   -Was somewhat anxious and agitated on 12/7 AM, increased Klonopin  to 0.5 mg 3 times daily - Currently alert and oriented, cortrack removed on 12/8, patient tolerating diet without difficulty, appears close  to his baseline mental status.     Bipolar disorder/akathisia/drug-induced parkinsonism: -  Patient with long standing history of bipolar disorder. - Patient was on Haldol  and Latuda , developed facial tremors 11/20 concern for tardive dyskinesia. - EEG negative for seizures. - Latuda  and Haldol  discontinued. - Patient was started on propranolol  and Klonopin .  On Ingrezza  -Due to oversedation, Klonopin  was decreased to 0.5 mg twice daily (from 1 mg 3 times daily) and valbenazine  decreased to 40 mg daily on 12/4 -  appreciate neurology, Dr Lindzen for evaluating and recommendations, psych will need to continue following and make necessary adjustments to the regimen Currently alert and oriented, management per psych   Dysphagia 11/29: Overnight after medications given with water  patient felt something got stuck, he require suctioning.  -evaluated by speech. MBS: sign of aspiration.   CT head. Stable. - Alert and oriented, core track removed on 12/8, tolerating diet and meds.   Aspiration PNA;  Acute hypoxic Resp failure.  -In setting of dysphagia and aspiration   - Completed 5 days of IV Unasyn    history of alcohol, tobacco use disorder: - Continue thiamine , folic acid  and multivitamin -Treated for alcohol withdrawal delirium with phenobarbital  taper at [facility]. Resolved   Essential hypertension: - BP stable  Amlodipine  and losartan  has been discontinued. Propranolol  dose decreased to avoid hypotension   Constipation: Continue laxatives.    Bradycardia - Holding parameters on for propranolol     Severe malnutrition: Continue ensure.  -Started on tube feeds   Iron deficiency anemia; started  iron supplement.    Oral Thrush; has been on nystatin  since last 13 days.  Will discontinue them.  No oral thrush noted today.   Protein calorie malnutrition Nutrition Problem: Severe Malnutrition Etiology: acute illness Signs/Symptoms: severe muscle depletion, moderate muscle depletion Interventions: Ensure Enlive (each supplement provides 350kcal and 20 grams of protein),  MVI  DVT prophylaxis: heparin  injection 5,000 Units Start: 08/13/24 1400 SCDs Start: 08/12/24 1150 Place TED hose Start: 08/12/24 1150 SCDs Start: 08/01/24 1745 Place and maintain sequential compression device Start: 07/29/24 1819   Code Status: Full Code  Family Communication:  None present at bedside.  Plan of care discussed with patient in length and he/she verbalized understanding and agreed with it.  Status is: Inpatient Remains inpatient appropriate because: Patient medically stable, pending placement to SNF.   Estimated body mass index is 20.62 kg/m as calculated from the following:   Height as of this encounter: 5' 10 (1.778 m).   Weight as of this encounter: 65.2 kg.    Nutritional Assessment: Body mass index is 20.62 kg/m.SABRA Seen by dietician.  I agree with the assessment and plan as outlined below: Nutrition Status: Nutrition Problem: Severe Malnutrition Etiology: acute illness Signs/Symptoms: severe muscle depletion, moderate muscle depletion Interventions: Ensure Enlive (each supplement provides 350kcal and 20 grams of protein), MVI  . Skin Assessment: I have examined the patient's skin and I agree with the wound assessment as performed by the wound care RN as outlined below:    Consultants:  Neurology, psychiatry, neurosurgery  Procedures:  As above  Antimicrobials:  Anti-infectives (From admission, onward)    Start     Dose/Rate Route Frequency Ordered Stop   09/02/24 1700  Ampicillin -Sulbactam (UNASYN ) 3 g in sodium chloride  0.9 % 100 mL IVPB        3 g 200 mL/hr over 30 Minutes Intravenous Every 6 hours 09/02/24 1611 09/07/24 1230   08/31/24 1000  vancomycin  (VANCOREADY) IVPB 750 mg/150 mL  750 mg 150 mL/hr over 60 Minutes Intravenous Every 12 hours 08/31/24 0015     08/21/24 0800  vancomycin  (VANCOCIN ) IVPB 1000 mg/200 mL premix  Status:  Discontinued        1,000 mg 200 mL/hr over 60 Minutes Intravenous Every 12 hours 08/21/24 0720  08/31/24 0015   08/19/24 0600  vancomycin  (VANCOREADY) IVPB 1250 mg/250 mL  Status:  Discontinued        1,250 mg 166.7 mL/hr over 90 Minutes Intravenous Every 12 hours 08/18/24 1818 08/21/24 0547   08/18/24 1830  vancomycin  (VANCOREADY) IVPB 500 mg/100 mL        500 mg 100 mL/hr over 60 Minutes Intravenous  Once 08/18/24 1817 08/19/24 1642   08/16/24 1600  vancomycin  (VANCOREADY) IVPB 750 mg/150 mL  Status:  Discontinued        750 mg 150 mL/hr over 60 Minutes Intravenous Every 8 hours 08/16/24 0828 08/18/24 1818   08/14/24 0800  vancomycin  (VANCOCIN ) IVPB 1000 mg/200 mL premix  Status:  Discontinued        1,000 mg 200 mL/hr over 60 Minutes Intravenous Every 8 hours 08/14/24 0016 08/16/24 0828   08/14/24 0030  vancomycin  (VANCOCIN ) IVPB 1000 mg/200 mL premix        1,000 mg 200 mL/hr over 60 Minutes Intravenous NOW 08/14/24 0016 08/14/24 0135   08/13/24 0100  vancomycin  (VANCOREADY) IVPB 750 mg/150 mL  Status:  Discontinued        750 mg 150 mL/hr over 60 Minutes Intravenous Every 12 hours 08/12/24 1230 08/14/24 0007   08/12/24 1400  ceFAZolin  (ANCEF ) IVPB 2g/100 mL premix  Status:  Discontinued        2 g 200 mL/hr over 30 Minutes Intravenous Every 8 hours 08/12/24 1149 08/12/24 1230   08/12/24 1400  cefTAZidime  (FORTAZ ) 2 g in sodium chloride  0.9 % 100 mL IVPB  Status:  Discontinued        2 g 200 mL/hr over 30 Minutes Intravenous Every 8 hours 08/12/24 1152 08/13/24 1109   08/12/24 1245  ceFAZolin  (ANCEF ) IVPB 2g/100 mL premix  Status:  Discontinued        2 g 200 mL/hr over 30 Minutes Intravenous  Once 08/12/24 1149 08/12/24 1449   08/12/24 1245  vancomycin  (VANCOREADY) IVPB 1250 mg/250 mL        1,250 mg 166.7 mL/hr over 90 Minutes Intravenous  Once 08/12/24 1152 08/12/24 1501   08/12/24 0957  vancomycin  (VANCOCIN ) powder  Status:  Discontinued          As needed 08/12/24 1008 08/12/24 1037   08/01/24 2200  ceFAZolin  (ANCEF ) IVPB 2g/100 mL premix        2 g 200 mL/hr over 30  Minutes Intravenous Every 8 hours 08/01/24 1703 08/02/24 1915   08/01/24 1530  vancomycin  (VANCOCIN ) powder  Status:  Discontinued          As needed 08/01/24 1559 08/01/24 1606   08/01/24 1421  ceFAZolin  (ANCEF ) 2-4 GM/100ML-% IVPB       Note to Pharmacy: Roslynn Birmingham: cabinet override      08/01/24 1421 08/02/24 0229   07/29/24 2200  ceFAZolin  (ANCEF ) IVPB 2g/100 mL premix        2 g 200 mL/hr over 30 Minutes Intravenous Every 8 hours 07/29/24 1859 07/30/24 1423   07/29/24 1654  vancomycin  (VANCOCIN ) powder  Status:  Discontinued          As needed 07/29/24 1655 07/29/24 1758   07/29/24 1515  ceFAZolin  (ANCEF ) IVPB 2g/100 mL premix        2 g 200 mL/hr over 30 Minutes Intravenous  Once 07/29/24 1421 07/29/24 1655         Subjective: Patient seen and examined, sitter at the bedside.  Patient fully alert and oriented and he had no complaints.  Objective: Vitals:   09/11/24 0038 09/11/24 0400 09/11/24 0500 09/11/24 0801  BP: 124/86   107/72  Pulse: 68   (!) 50  Resp: 17 18  18   Temp: 97.6 F (36.4 C)   98.3 F (36.8 C)  TempSrc: Axillary   Oral  SpO2: 97%   97%  Weight:   65.2 kg   Height:        Intake/Output Summary (Last 24 hours) at 09/11/2024 1057 Last data filed at 09/11/2024 1000 Gross per 24 hour  Intake 1270 ml  Output --  Net 1270 ml   Filed Weights   09/09/24 0438 09/10/24 0500 09/11/24 0500  Weight: 65.9 kg 65.9 kg 65.2 kg    Examination:  General exam: Appears calm and comfortable  Respiratory system: Clear to auscultation. Respiratory effort normal. Cardiovascular system: S1 & S2 heard, RRR. No JVD, murmurs, rubs, gallops or clicks. No pedal edema. Gastrointestinal system: Abdomen is nondistended, soft and nontender. No organomegaly or masses felt. Normal bowel sounds heard. Central nervous system: Alert and oriented. No focal neurological deficits. Extremities: Symmetric 5 x 5 power. Skin: No rashes, lesions or ulcers  Data Reviewed: I have  personally reviewed following labs and imaging studies  CBC: Recent Labs  Lab 09/08/24 0558 09/11/24 0918  WBC 2.8* 4.1  HGB 11.5* 12.6*  HCT 35.0* 39.1  MCV 89.3 91.1  PLT 195 202   Basic Metabolic Panel: Recent Labs  Lab 09/05/24 0419 09/08/24 0558 09/11/24 0918  NA 143 142 143  K 4.3 4.0 4.2  CL 110 110 109  CO2 22 22 25   GLUCOSE 79 111* 128*  BUN 18 13 18   CREATININE 1.03 0.79 1.07  CALCIUM 9.1 8.8* 9.6   GFR: Estimated Creatinine Clearance: 67.7 mL/min (by C-G formula based on SCr of 1.07 mg/dL). Liver Function Tests: No results for input(s): AST, ALT, ALKPHOS, BILITOT, PROT, ALBUMIN  in the last 168 hours. No results for input(s): LIPASE, AMYLASE in the last 168 hours. No results for input(s): AMMONIA in the last 168 hours. Coagulation Profile: No results for input(s): INR, PROTIME in the last 168 hours. Cardiac Enzymes: No results for input(s): CKTOTAL, CKMB, CKMBINDEX, TROPONINI in the last 168 hours. BNP (last 3 results) No results for input(s): PROBNP in the last 8760 hours. HbA1C: No results for input(s): HGBA1C in the last 72 hours. CBG: Recent Labs  Lab 09/10/24 0402 09/10/24 1144 09/10/24 1549 09/10/24 2015 09/11/24 0020  GLUCAP 102* 124* 116* 94 104*   Lipid Profile: No results for input(s): CHOL, HDL, LDLCALC, TRIG, CHOLHDL, LDLDIRECT in the last 72 hours. Thyroid Function Tests: No results for input(s): TSH, T4TOTAL, FREET4, T3FREE, THYROIDAB in the last 72 hours. Anemia Panel: No results for input(s): VITAMINB12, FOLATE, FERRITIN, TIBC, IRON, RETICCTPCT in the last 72 hours. Sepsis Labs: No results for input(s): PROCALCITON, LATICACIDVEN in the last 168 hours.  No results found for this or any previous visit (from the past 240 hours).   Radiology Studies: DG Swallowing Func-Speech Pathology Result Date: 09/09/2024 Table formatting from the original result was not  included. Modified Barium Swallow Study Patient Details Name: Ernest Romero MRN: [id number] Date of Birth: May 12, 1966 Today's  Date: 09/09/2024 HPI/PMH: HPI: Guadalupe Nickless is a 58 y.o. male who was admitted to [facility] 06/30/24 after an unresponsive episode in jail. Dx large SDH; underwent left frontoparietal craniotomy with evacuation of SDH and drain placement 9/28 (drain removed 9/29). Transferred to [facility] for management of crani/flap 10/14. Cranioplasty with bone flap replacement 10/27; worsening neuro with midline shift; underwent  left burr hole for evacuation 10/30. Bone flap removal on 11/10.  Bedside swallow eval 10/28 after change in mentaion and acute change in swallowing; NPO, then advanced to dysphagia 3/thin liquids 10/31.  New orders 11/18 due to coughing with POs - rec continued Dys3/thin liquids. Repeat orders 11/28 due to acute change again in swallow. MBS completed on 11/29 - severe oropharyngeal dysphagia with aspiration of all liquids and puree solids; rec NPO. Repeat MBS 12/4- no significant change. Cortrak placed. [facility] 12/1 with evolving postoperative changes from left sided hemicraniectomy and bone flap removal without evidence of an acute intracranial abnormality or significant residual subdural collection. Clinical Impression: Mr. Rudman' swallow has improved, however, he continues to have occasional aspiration of POs. This appears to be related to a too-tight UES.  Aspiration occurred infrequently and after the swallow when material sitting within the pharyngo-esophageal segment was squeezed upward then spilled over the arytenoids and into the trachea.  A head turn to the left appeared to improve bolus flow through the PES.  Overall, there was improved laryngeal mobility and epiglottic inversion. Pt was attentive and able to follow commands.  He is adamant that he wants to resume eating.  Spoke with Dr. Davia and RD, Leverne Ruth. There may be some benefit to removing the cortrak and allowing more space  for bolus flow.  They agreed with plan.  Recommend starting a dysphagia 3 diet; thin liquids.  Turn head to the left when swallowing. If possible, crush meds in puree. Otherwise give with applesauce with head turned to the left. Even in the presence of aspiration, we can stave off potential pna by ensuring pt gets frequent oral care and that he is being mobilized regularly. He should eat meals in recliner when possible. SLP will follow for overall toleration/education. Factors that may increase risk of adverse event in presence of aspiration Noe & Lianne 2021): Factors that may increase risk of adverse event in presence of aspiration Noe & Lianne 2021): Limited mobility; Frail or deconditioned; Inadequate oral hygiene; Weak cough; Aspiration of thick, dense, and/or acidic materials Recommendations/Plan: Swallowing Evaluation Recommendations Swallowing Evaluation Recommendations Recommendations: PO diet PO Diet Recommendation: Dysphagia 3 (Mechanical soft); Thin liquids (Level 0) Liquid Administration via: Cup; Straw Medication Administration: Crushed with puree (when able. Otherwise give whole with puree) Supervision: Full supervision/cueing for swallowing strategies Swallowing strategies  : effortful swallow; Head turn left during swallowing Postural changes: Position pt fully upright for meals Oral care recommendations: Oral care QID (4x/day) Treatment Plan Treatment Plan Treatment recommendations: Therapy as outlined in treatment plan below Functional status assessment: Patient has had a recent decline in their functional status and demonstrates the ability to make significant improvements in function in a reasonable and predictable amount of time. Treatment frequency: Min 3x/week Treatment duration: 2 weeks Interventions: Trials of upgraded texture/liquids; Diet toleration management by SLP; Patient/family education Recommendations Recommendations for follow up therapy are one component of a  multi-disciplinary discharge planning process, led by the attending physician.  Recommendations may be updated based on patient status, additional functional criteria and insurance authorization. Assessment: Orofacial Exam: Orofacial Exam Oral Cavity - Dentition: Poor condition;  Missing dentition Anatomy: Anatomy: WFL Boluses Administered: Boluses Administered Boluses Administered: Thin liquids (Level 0); Mildly thick liquids (Level 2, nectar thick); Puree; Solid  Oral Impairment Domain: Oral Impairment Domain Lip Closure: No labial escape Tongue control during bolus hold: Cohesive bolus between tongue to palatal seal Bolus preparation/mastication: Slow prolonged chewing/mashing with complete recollection Bolus transport/lingual motion: Brisk tongue motion Oral residue: Trace residue lining oral structures Initiation of pharyngeal swallow : Valleculae  Pharyngeal Impairment Domain: Pharyngeal Impairment Domain Soft palate elevation: No bolus between soft palate (SP)/pharyngeal wall (PW) Laryngeal elevation: Complete superior movement of thyroid cartilage with complete approximation of arytenoids to epiglottic petiole Anterior hyoid excursion: Complete anterior movement Epiglottic movement: Complete inversion Laryngeal vestibule closure: Complete, no air/contrast in laryngeal vestibule Pharyngeal stripping wave : Present - complete Pharyngeal contraction (A/P view only): N/A Pharyngoesophageal segment opening: Minimal distention/minimal duration, marked obstruction of flow Tongue base retraction: Trace column of contrast or air between tongue base and PPW Pharyngeal residue: Collection of residue within or on pharyngeal structures Location of pharyngeal residue: Valleculae  Esophageal Impairment Domain: No data recorded Pill: Pill Consistency administered: -- (NT) Penetration/Aspiration Scale Score: Penetration/Aspiration Scale Score 1.  Material does not enter airway: Puree; Solid 7.  Material enters airway, passes  BELOW cords and not ejected out despite cough attempt by patient: Thin liquids (Level 0); Mildly thick liquids (Level 2, nectar thick) Compensatory Strategies: Compensatory Strategies Compensatory strategies: Yes Left head turn: Effective Effective Left Head Turn: Thin liquid (Level 0)   General Information: Caregiver present: No  Diet Prior to this Study: NPO   Temperature : Normal   Respiratory Status: WFL   Supplemental O2: None (Room air)   History of Recent Intubation: No  Behavior/Cognition: Alert; Cooperative; Pleasant mood No data recorded Baseline vocal quality/speech: Normal Volitional Cough: Able to elicit Volitional Swallow: Able to elicit Exam Limitations: No limitations Goal Planning: Prognosis for improved oropharyngeal function: Good No data recorded No data recorded No data recorded Consulted and agree with results and recommendations: Patient; Physician; Dietitian Pain: Pain Assessment Pain Assessment: No/denies pain End of Session: Start Time:SLP Start Time (ACUTE ONLY): 1420 Stop Time: SLP Stop Time (ACUTE ONLY): 1450 Time Calculation:SLP Time Calculation (min) (ACUTE ONLY): 30 min Charges: SLP Evaluations $ SLP Speech Visit: 1 Visit SLP Evaluations $MBS Swallow: 1 Procedure SLP visit diagnosis: SLP Visit Diagnosis: Dysphagia, oropharyngeal phase (R13.12) Past Medical History: Past Medical History: Diagnosis Date  Anxiety   Bipolar 1 disorder (HCC)   Depression   Hepatitis   Hypertension   MRSA (methicillin resistant Staphylococcus aureus)   5/25 arm abscess and 11/25 Past Surgical History: Past Surgical History: Procedure Laterality Date  BURR HOLE N/A 08/01/2024  Procedure: CREATION, CRANIAL BURR HOLE WITH EVACUATION OF HEMATOMA;  Surgeon: Rosslyn Dino HERO, MD;  Location: [facility] OR;  Service: Neurosurgery;  Laterality: N/A;  BURR HOLE N/A 08/12/2024  Procedure: left bone flap removal;  Surgeon: Rosslyn Dino HERO, MD;  Location: [facility] OR;  Service: Neurosurgery;  Laterality: N/A;  CRANIOPLASTY, WITH  BONE FLAP REPLACEMENT Left 07/29/2024  Procedure: CRANIOPLASTY, WITH BONE FLAP REPLACEMENT;  Surgeon: Rosslyn Dino HERO, MD;  Location: [facility] OR;  Service: Neurosurgery;  Laterality: Left;  LEFT CRANIOPLASTY WITH ARTIFICAL BONE FLAP REPLACEMENT  CRANIOTOMY Left 06/30/2024  Procedure: CRANIOTOMY HEMATOMA EVACUATION SUBDURAL;  Surgeon: Melonie Grass, MD;  Location: [facility] ORS;  Service: Neurosurgery;  Laterality: Left; [name] 09/09/2024, 3:30 PM   Scheduled Meds:  benztropine   1 mg Oral BID   Chlorhexidine  Gluconate Cloth  6 each Topical Daily   clonazePAM   0.5 mg Oral TID   docusate  100 mg Oral Daily   feeding supplement  237 mL Oral BID BM   ferrous sulfate   325 mg Oral Q breakfast   folic acid   1 mg Oral Daily   gabapentin   200 mg Oral TID   heparin  injection (subcutaneous)  5,000 Units Subcutaneous Q8H   mirtazapine   15 mg Oral QHS   multivitamin with minerals  1 tablet Oral Daily   nicotine   14 mg Transdermal Daily   nystatin   5 mL Oral QID   pantoprazole   40 mg Oral QHS   polyethylene glycol  17 g Oral Daily   propranolol   20 mg Oral BID   senna  2 tablet Oral Daily   sodium chloride  flush  10-40 mL Intracatheter Q12H   thiamine   100 mg Oral Daily   valbenazine   40 mg Oral Daily   Continuous Infusions:  lactated ringers  75 mL/hr at 09/11/24 0716   vancomycin  750 mg (09/11/24 0842)     LOS: 58 days   Fredia Skeeter, MD [facility]  09/11/2024, 10:57 AM   *Please note that this is a verbal dictation therefore any spelling or grammatical errors are due to the Dragon Medical One system interpretation.  Please page via Amion and do not message via secure chat for urgent patient care matters. Secure chat can be used for non urgent patient care matters.  How to contact the [facility] Attending or Consulting provider 7A - 7P or covering provider during after hours 7P -7A, for this patient?  Check the care team in [facility] and look for a) attending/consulting [facility]  provider listed and b) the [facility] team listed. Page or secure chat 7A-7P. Log into [email or web address] and use [place]'s universal password to access. If you do not have the password, please contact the hospital operator. Locate the [facility] provider you are looking for under [facility] and page to a number that you can be directly reached. If you still have difficulty reaching the provider, please page the [facility] (Director on Call) for the Hospitalists listed on amion for assistance.

## 2024-09-11 NOTE — Progress Notes (Signed)
 Physical Therapy Treatment Patient Details Name: Ernest Romero MRN: [id number] DOB: [birth year] Today's Date: 09/11/2024   History of Present Illness Pt is a 58 y.o. male presenting 10/14 from [facility] where he was admitted 9/28 for unresponsive episode in the jail. 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 On CIWA protocol. Transferred to [facility] for further management of craniotomy and flap. 10/27 s/p cranioplasty with bone flap replacement. 10/28 Change in status thought to be related to Klonopin  administration 10/28, repeat CTH shows significant increase in size of mixed attenuation subdural fluid collection underlying the L tempoparietal craniotomy site, now measuring approximately 15 mm in thickness, with associated mild mass effect and slight rightward midline shift. OR on 10/30 for SDH evacuation. [facility] 11/7 with fluid collected under bone flap. 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 PMH alcohol use disorder, psychiatric disorder, HTN    PT Comments  Patient progressing in session with cues and A for cervical flexion, facilitation for trunk rotation.  Remains limited due to postural and body awareness deficits.  Pt will continue to benefit from skilled PT in the acute setting and from post-acute inpatient rehab (<3 hours/day) at d/c.     If plan is discharge home, recommend the following: A lot of help with bathing/dressing/bathroom;Assistance with cooking/housework;Direct supervision/assist for medications management;Direct supervision/assist for financial management;Assist for transportation;Help with stairs or ramp for entrance;Supervision due to cognitive status;A little help with walking and/or transfers   Can travel by private vehicle     Yes  Equipment Recommendations  Wheelchair (measurements PT);Wheelchair cushion (measurements PT);Rolling walker (2 wheels)    Recommendations for Other  Services       Precautions / Restrictions Precautions Precautions: Fall Recall of Precautions/Restrictions: Impaired Precaution/Restrictions Comments: SBP <160, helmet (pt to wear when OOB per RN)     Mobility  Bed Mobility Overal bed mobility: Needs Assistance       Supine to sit: Supervision Sit to supine: Supervision   General bed mobility comments: up to EOB increased time S for safety    Transfers Overall transfer level: Needs assistance Equipment used: None Transfers: Sit to/from Stand Sit to Stand: Contact guard assist           General transfer comment: for balance    Ambulation/Gait Ambulation/Gait assistance: Min assist, Contact guard assist Gait Distance (Feet): 450 Feet Assistive device: None Gait Pattern/deviations: Step-through pattern, Decreased stride length, Shuffle       General Gait Details: cervical extension, mild posterior lean, cues and intermittent assist for chin tuck, forward gaze and facilitation for trunk rotation   Stairs             Wheelchair Mobility     Tilt Bed    Modified Rankin (Stroke Patients Only)       Balance Overall balance assessment: Needs assistance   Sitting balance-Leahy Scale: Fair   Postural control: Posterior lean   Standing balance-Leahy Scale: Poor                              Communication Communication Communication: Impaired Factors Affecting Communication: Reduced clarity of speech  Cognition Arousal: Alert Behavior During Therapy: Flat affect   PT - Cognitive impairments: Memory, Problem solving, Safety/Judgement, Sequencing                       PT -  Cognition Comments: asking for RN to adjust meds due to continued jaw tremor Following commands: Impaired Following commands impaired: Follows one step commands with increased time, Follows multi-step commands inconsistently, Follows multi-step commands with increased time    Cueing Cueing Techniques:  Verbal cues  Exercises      General Comments General comments (skin integrity, edema, etc.): VSS, sitter present and assisted to push IV pole      Pertinent Vitals/Pain Pain Assessment Pain Assessment: No/denies pain    Home Living                          Prior Function            PT Goals (current goals can now be found in the care plan section) Progress towards PT goals: Progressing toward goals    Frequency    Min 2X/week      PT Plan      Co-evaluation              AM-PAC PT 6 Clicks Mobility   Outcome Measure  Help needed turning from your back to your side while in a flat bed without using bedrails?: A Little Help needed moving from lying on your back to sitting on the side of a flat bed without using bedrails?: A Little Help needed moving to and from a bed to a chair (including a wheelchair)?: A Little Help needed standing up from a chair using your arms (e.g., wheelchair or bedside chair)?: A Little Help needed to walk in hospital room?: A Little Help needed climbing 3-5 steps with a railing? : A Lot 6 Click Score: 17    End of Session Equipment Utilized During Treatment: Gait belt;Other (comment) (helmet) Activity Tolerance: Patient tolerated treatment well Patient left: in bed;with call bell/phone within reach;with bed alarm set   PT Visit Diagnosis: Other symptoms and signs involving the nervous system (R29.898);Muscle weakness (generalized) (M62.81)     Time: 8593-8579 PT Time Calculation (min) (ACUTE ONLY): 14 min  Charges:    $Gait Training: 8-22 mins PT General Charges $$ ACUTE PT VISIT: 1 Visit                     Micheline Portal, PT Acute Rehabilitation Services Office:[phone] 09/11/2024    Montie Portal 09/11/2024, 5:41 PM

## 2024-09-12 LAB — GLUCOSE, CAPILLARY
Glucose-Capillary: 102 mg/dL — ABNORMAL HIGH (ref 70–99)
Glucose-Capillary: 108 mg/dL — ABNORMAL HIGH (ref 70–99)
Glucose-Capillary: 113 mg/dL — ABNORMAL HIGH (ref 70–99)
Glucose-Capillary: 115 mg/dL — ABNORMAL HIGH (ref 70–99)
Glucose-Capillary: 115 mg/dL — ABNORMAL HIGH (ref 70–99)

## 2024-09-12 NOTE — Progress Notes (Signed)
 Ernest Romero  FMW:[id number] DOB: May 19, 1966 DOA: 07/15/2024 PCP: [name]   Brief Narrative:  58 year old homeless, medical history significant for alcohol use disorder, bipolar and panic disorder, initially presented to [facility] from jail in September 2025 with a large subdural hematoma.  Underwent craniotomy with flap replacement on 10/27.  Since then multiple OR visits including 10/30 for SDH evacuation, follow-up CT head 11/8 showed fluid collection underneath the bone flap, back to the OR 11/10 and then bone flap was removed, cultures were obtained intraoperative growing MRSA, treating for infected bone flap, ID following and currently on IV vancomycin .   10/27 crani w/ flap replacement 10/29: off clevi, liberate SBP goal, Klonopin , lithium , Haldol  as needed discontinued.  Latuda  dose cut by half 10/30: repeat CT head with increased collection - going to OR this afternoon for evacuation 10/31: OR  for SDH evacuation.  Developed arm twitching.  Klonopin  restarted at half dose 11/1: More awake.  No complaint 11/2 transferred out of ICU. 11/4 cortrak removed 11/8 CT head showed fluid collection under bone flap, decision made to remove bone flap 11/10 to OR for L bone flap removal 11/11 repeat CT head stable 11/29: notice to have worsening Dysphagia.  12/1 repeat CT head: stable.  12/3: Cortrack placed for nutrition, sutures removed by neurosurgery.  Tube feeds started. 12/4: Very sedated, psychiatry requested to readjust meds, reduced clonazepam  to0.5mg  BID, decreased valbenazine  to 40 mg daily 12/5: Noted oriented but jaw tremors worse.  Evaluated by neurology, recommended psych to continue to follow and adjust medications  Assessment & Plan:   Principal Problem:   Subdural hematoma (HCC) Active Problems:   Bipolar disorder (HCC)   Unable to make decisions about medical treatment due to impaired mental capacity   Alcohol withdrawal  (HCC)   Essential hypertension   Protein-calorie malnutrition, severe   Acute metabolic encephalopathy   Infection of craniotomy plate  Subdural hematoma /  Status post initial craniotomy 9/28 (Dr. 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 - Followed by neurosurgery. -Evaluated by infectious disease who recommends 6 weeks of IV vancomycin , EOT 09/23/2024.  After completion of IV antibiotics he will need 8 weeks of doxycycline . - Will need PICC line placed closer to discharge date. - Completed 1 week of Keppra  -  sutures were removed without complication by neurosurgery on 12/3.  EVD sutures remain intact, no signs of drainage or erythema, recommended do not remove at this time     -Acute metabolic encephalopathy - At baseline, patient reportedly without cognitive impairment, was homeless and independent caring for self. - At some point, during this hospitalization patient was deemed not to have capacity, due to encephalopathy and neurological deficits. Per recent physicians notes patient appears to be able to make decision for himself moving forward. Continue to monitor, capacity could fluctuates.  -  Patient was too oversedated before and psychiatry readjusted medications to reduce clonazepam  and valbenazine .  Subsequently became alert, agitated with worsening jaw tremor - appreciate neurology, Dr Lindzen for evaluating and recommendations, psych will need to continue following and make necessary adjustments to the regimen - Placed OT consult. Jaw tremors alleviates temporarily with distraction   -Was somewhat anxious and agitated on 12/7 AM, increased Klonopin  to 0.5 mg 3 times daily - Currently alert and oriented, cortrack removed on 12/8, patient tolerating diet without difficulty, appears close  to his baseline mental status.     Bipolar disorder/akathisia/drug-induced parkinsonism: -  Patient with long standing history of bipolar disorder. - Patient was on Haldol  and Latuda , developed facial tremors 11/20 concern for tardive dyskinesia. - EEG negative for seizures. - Latuda  and Haldol  discontinued. - Patient was started on propranolol  and Klonopin .  On Ingrezza  -Due to oversedation, Klonopin  was decreased to 0.5 mg twice daily (from 1 mg 3 times daily) and valbenazine  decreased to 40 mg daily on 12/4 -  appreciate neurology, Dr Lindzen for evaluating and recommendations, psych will need to continue following and make necessary adjustments to the regimen Currently alert and oriented, management per psych   Dysphagia 11/29: Overnight after medications given with water  patient felt something got stuck, he require suctioning.  -evaluated by speech. MBS: sign of aspiration.   CT head. Stable. - Alert and oriented, core track removed on 12/8, tolerating diet and meds.   Aspiration PNA;  Acute hypoxic Resp failure.  -In setting of dysphagia and aspiration   - Completed 5 days of IV Unasyn    history of alcohol, tobacco use disorder: - Continue thiamine , folic acid  and multivitamin -Treated for alcohol withdrawal delirium with phenobarbital  taper at [facility]. Resolved   Essential hypertension: - BP stable  Amlodipine  and losartan  has been discontinued. Propranolol  dose decreased to avoid hypotension   Constipation: Continue laxatives.    Bradycardia - Holding parameters on for propranolol     Severe malnutrition: Continue ensure.  -Started on tube feeds   Iron deficiency anemia; started  iron supplement.    Oral Thrush; has been on nystatin  since last 13 days.  Will discontinue them.  No oral thrush noted today.   Protein calorie malnutrition Nutrition Problem: Severe Malnutrition Etiology: acute illness Signs/Symptoms: severe muscle depletion, moderate muscle depletion Interventions: Ensure Enlive (each supplement provides 350kcal and 20 grams of protein),  MVI  DVT prophylaxis: heparin  injection 5,000 Units Start: 08/13/24 1400 SCDs Start: 08/12/24 1150 Place TED hose Start: 08/12/24 1150 SCDs Start: 08/01/24 1745 Place and maintain sequential compression device Start: 07/29/24 1819   Code Status: Full Code  Family Communication:  None present at bedside.  Plan of care discussed with patient in length and he/she verbalized understanding and agreed with it.  Status is: Inpatient Remains inpatient appropriate because: Patient medically stable, pending placement to SNF.   Estimated body mass index is 20.88 kg/m as calculated from the following:   Height as of this encounter: 5' 10 (1.778 m).   Weight as of this encounter: 66 kg.    Nutritional Assessment: Body mass index is 20.88 kg/m.SABRA Seen by dietician.  I agree with the assessment and plan as outlined below: Nutrition Status: Nutrition Problem: Severe Malnutrition Etiology: acute illness Signs/Symptoms: severe muscle depletion, moderate muscle depletion Interventions: Ensure Enlive (each supplement provides 350kcal and 20 grams of protein), MVI  . Skin Assessment: I have examined the patient's skin and I agree with the wound assessment as performed by the wound care RN as outlined below:    Consultants:  Neurology, psychiatry, neurosurgery  Procedures:  As above  Antimicrobials:  Anti-infectives (From admission, onward)    Start     Dose/Rate Route Frequency Ordered Stop   09/02/24 1700  Ampicillin -Sulbactam (UNASYN ) 3 g in sodium chloride  0.9 % 100 mL IVPB        3 g 200 mL/hr over 30 Minutes Intravenous Every 6 hours 09/02/24 1611 09/07/24 1230   08/31/24 1000  vancomycin  (VANCOREADY) IVPB 750 mg/150 mL  750 mg 150 mL/hr over 60 Minutes Intravenous Every 12 hours 08/31/24 0015     08/21/24 0800  vancomycin  (VANCOCIN ) IVPB 1000 mg/200 mL premix  Status:  Discontinued        1,000 mg 200 mL/hr over 60 Minutes Intravenous Every 12 hours 08/21/24 0720 08/31/24  0015   08/19/24 0600  vancomycin  (VANCOREADY) IVPB 1250 mg/250 mL  Status:  Discontinued        1,250 mg 166.7 mL/hr over 90 Minutes Intravenous Every 12 hours 08/18/24 1818 08/21/24 0547   08/18/24 1830  vancomycin  (VANCOREADY) IVPB 500 mg/100 mL        500 mg 100 mL/hr over 60 Minutes Intravenous  Once 08/18/24 1817 08/19/24 1642   08/16/24 1600  vancomycin  (VANCOREADY) IVPB 750 mg/150 mL  Status:  Discontinued        750 mg 150 mL/hr over 60 Minutes Intravenous Every 8 hours 08/16/24 0828 08/18/24 1818   08/14/24 0800  vancomycin  (VANCOCIN ) IVPB 1000 mg/200 mL premix  Status:  Discontinued        1,000 mg 200 mL/hr over 60 Minutes Intravenous Every 8 hours 08/14/24 0016 08/16/24 0828   08/14/24 0030  vancomycin  (VANCOCIN ) IVPB 1000 mg/200 mL premix        1,000 mg 200 mL/hr over 60 Minutes Intravenous NOW 08/14/24 0016 08/14/24 0135   08/13/24 0100  vancomycin  (VANCOREADY) IVPB 750 mg/150 mL  Status:  Discontinued        750 mg 150 mL/hr over 60 Minutes Intravenous Every 12 hours 08/12/24 1230 08/14/24 0007   08/12/24 1400  ceFAZolin  (ANCEF ) IVPB 2g/100 mL premix  Status:  Discontinued        2 g 200 mL/hr over 30 Minutes Intravenous Every 8 hours 08/12/24 1149 08/12/24 1230   08/12/24 1400  cefTAZidime  (FORTAZ ) 2 g in sodium chloride  0.9 % 100 mL IVPB  Status:  Discontinued        2 g 200 mL/hr over 30 Minutes Intravenous Every 8 hours 08/12/24 1152 08/13/24 1109   08/12/24 1245  ceFAZolin  (ANCEF ) IVPB 2g/100 mL premix  Status:  Discontinued        2 g 200 mL/hr over 30 Minutes Intravenous  Once 08/12/24 1149 08/12/24 1449   08/12/24 1245  vancomycin  (VANCOREADY) IVPB 1250 mg/250 mL        1,250 mg 166.7 mL/hr over 90 Minutes Intravenous  Once 08/12/24 1152 08/12/24 1501   08/12/24 0957  vancomycin  (VANCOCIN ) powder  Status:  Discontinued          As needed 08/12/24 1008 08/12/24 1037   08/01/24 2200  ceFAZolin  (ANCEF ) IVPB 2g/100 mL premix        2 g 200 mL/hr over 30 Minutes  Intravenous Every 8 hours 08/01/24 1703 08/02/24 1915   08/01/24 1530  vancomycin  (VANCOCIN ) powder  Status:  Discontinued          As needed 08/01/24 1559 08/01/24 1606   08/01/24 1421  ceFAZolin  (ANCEF ) 2-4 GM/100ML-% IVPB       Note to Pharmacy: Roslynn Birmingham: cabinet override      08/01/24 1421 08/02/24 0229   07/29/24 2200  ceFAZolin  (ANCEF ) IVPB 2g/100 mL premix        2 g 200 mL/hr over 30 Minutes Intravenous Every 8 hours 07/29/24 1859 07/30/24 1423   07/29/24 1654  vancomycin  (VANCOCIN ) powder  Status:  Discontinued          As needed 07/29/24 1655 07/29/24 1758   07/29/24 1515  ceFAZolin  (ANCEF ) IVPB 2g/100 mL premix        2 g 200 mL/hr over 30 Minutes Intravenous  Once 07/29/24 1421 07/29/24 1655         Subjective: Patient seen and examined.  He has no complaints.  He is fully alert and oriented.  Objective: Vitals:   09/11/24 1939 09/11/24 2123 09/12/24 0316 09/12/24 0500  BP: 124/68 124/68 101/68   Pulse: 73 73 60   Resp: 17  15   Temp: 98 F (36.7 C)  97.8 F (36.6 C)   TempSrc: Oral  Oral   SpO2: 98%  96%   Weight:    66 kg  Height:        Intake/Output Summary (Last 24 hours) at 09/12/2024 0755 Last data filed at 09/12/2024 0703 Gross per 24 hour  Intake 1840 ml  Output 800 ml  Net 1040 ml   Filed Weights   09/10/24 0500 09/11/24 0500 09/12/24 0500  Weight: 65.9 kg 65.2 kg 66 kg    Examination:  General exam: Appears calm and comfortable  Respiratory system: Clear to auscultation. Respiratory effort normal. Cardiovascular system: S1 & S2 heard, RRR. No JVD, murmurs, rubs, gallops or clicks. No pedal edema. Gastrointestinal system: Abdomen is nondistended, soft and nontender. No organomegaly or masses felt. Normal bowel sounds heard. Central nervous system: Alert and oriented. No focal neurological deficits. Extremities: Symmetric 5 x 5 power. Skin: No rashes, lesions or ulcers  Data Reviewed: I have personally reviewed following labs and  imaging studies  CBC: Recent Labs  Lab 09/08/24 0558 09/11/24 0918  WBC 2.8* 4.1  HGB 11.5* 12.6*  HCT 35.0* 39.1  MCV 89.3 91.1  PLT 195 202   Basic Metabolic Panel: Recent Labs  Lab 09/08/24 0558 09/11/24 0918  NA 142 143  K 4.0 4.2  CL 110 109  CO2 22 25  GLUCOSE 111* 128*  BUN 13 18  CREATININE 0.79 1.07  CALCIUM 8.8* 9.6   GFR: Estimated Creatinine Clearance: 68.5 mL/min (by C-G formula based on SCr of 1.07 mg/dL). Liver Function Tests: No results for input(s): AST, ALT, ALKPHOS, BILITOT, PROT, ALBUMIN  in the last 168 hours. No results for input(s): LIPASE, AMYLASE in the last 168 hours. No results for input(s): AMMONIA in the last 168 hours. Coagulation Profile: No results for input(s): INR, PROTIME in the last 168 hours. Cardiac Enzymes: No results for input(s): CKTOTAL, CKMB, CKMBINDEX, TROPONINI in the last 168 hours. BNP (last 3 results) No results for input(s): PROBNP in the last 8760 hours. HbA1C: No results for input(s): HGBA1C in the last 72 hours. CBG: Recent Labs  Lab 09/10/24 2015 09/11/24 0020 09/11/24 2106 09/12/24 0050 09/12/24 0522  GLUCAP 94 104* 118* 108* 102*   Lipid Profile: No results for input(s): CHOL, HDL, LDLCALC, TRIG, CHOLHDL, LDLDIRECT in the last 72 hours. Thyroid Function Tests: No results for input(s): TSH, T4TOTAL, FREET4, T3FREE, THYROIDAB in the last 72 hours. Anemia Panel: No results for input(s): VITAMINB12, FOLATE, FERRITIN, TIBC, IRON, RETICCTPCT in the last 72 hours. Sepsis Labs: No results for input(s): PROCALCITON, LATICACIDVEN in the last 168 hours.  No results found for this or any previous visit (from the past 240 hours).   Radiology Studies: No results found.   Scheduled Meds:  benztropine   1 mg Oral BID   Chlorhexidine  Gluconate Cloth  6 each Topical Daily   clonazePAM   0.5 mg Oral TID   docusate  100 mg Oral Daily   feeding  supplement  237  mL Oral BID BM   ferrous sulfate   325 mg Oral Q breakfast   folic acid   1 mg Oral Daily   gabapentin   200 mg Oral TID   heparin  injection (subcutaneous)  5,000 Units Subcutaneous Q8H   mirtazapine   15 mg Oral QHS   multivitamin with minerals  1 tablet Oral Daily   nicotine   14 mg Transdermal Daily   pantoprazole   40 mg Oral QHS   polyethylene glycol  17 g Oral Daily   propranolol   20 mg Oral BID   senna  2 tablet Oral Daily   sodium chloride  flush  10-40 mL Intracatheter Q12H   thiamine   100 mg Oral Daily   valbenazine   40 mg Oral Daily   Continuous Infusions:  lactated ringers  75 mL/hr at 09/12/24 0702   vancomycin  150 mL/hr at 09/12/24 0703     LOS: 59 days   Fredia Skeeter, MD [facility]  09/12/2024, 7:55 AM   *Please note that this is a verbal dictation therefore any spelling or grammatical errors are due to the Dragon Medical One system interpretation.  Please page via Amion and do not message via secure chat for urgent patient care matters. Secure chat can be used for non urgent patient care matters.  How to contact the [facility] Attending or Consulting provider 7A - 7P or covering provider during after hours 7P -7A, for this patient?  Check the care team in [facility] and look for a) attending/consulting [facility] provider listed and b) the [facility] team listed. Page or secure chat 7A-7P. Log into [email or web address] and use [place]'s universal password to access. If you do not have the password, please contact the hospital operator. Locate the [facility] provider you are looking for under [facility] and page to a number that you can be directly reached. If you still have difficulty reaching the provider, please page the [facility] (Director on Call) for the Hospitalists listed on amion for assistance.

## 2024-09-12 NOTE — Progress Notes (Signed)
 Occupational Therapy Treatment Patient Details Name: Ernest Romero MRN: [id number] DOB: 1965-12-20 Today's Date: 09/12/2024   History of present illness Pt is a 58 y.o. male presenting 10/14 from [facility] where he was admitted 9/[facility] in [facility]. 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 On CIWA protocol. Transferred to [facility] for further management of craniotomy and flap. 10/27 s/p cranioplasty with bone flap replacement. 10/28 Change in status thought to be related to Klonopin  administration 10/28, repeat CTH shows significant increase in size of mixed attenuation subdural fluid collection underlying the L tempoparietal craniotomy site, now measuring approximately 15 mm in thickness, with associated mild mass effect and slight rightward midline shift. OR on 10/30 for SDH evacuation. [facility] 11/7 with fluid collected under bone flap. 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 PMH alcohol use disorder, psychiatric disorder, HTN   OT comments  Patient up in chair completing breakfast and agreeable to OT treatment. Patient demonstrating good gains with OT treatment with CGA for standing balance during self care and transfers and able to change socks with supervision. Patient performed mobility in hallway with CGA and no AD. Patient returned to bed at end of session with sitter present.  Patient will benefit from continued inpatient follow up therapy, <3 hours/day.  Acute OT to continue to follow to address established goals to facilitate DC to next venue of care.        If plan is discharge home, recommend the following:  Supervision due to cognitive status;Direct supervision/assist for medications management;Assistance with cooking/housework;Direct supervision/assist for financial management;Assist for transportation;Assistance with feeding;A little help with walking and/or transfers;Help with  stairs or ramp for entrance;A little help with bathing/dressing/bathroom   Equipment Recommendations  BSC/3in1    Recommendations for Other Services      Precautions / Restrictions Precautions Precautions: Fall Recall of Precautions/Restrictions: Impaired Precaution/Restrictions Comments: SBP <160, helmet (pt to wear when OOB per RN) Restrictions Weight Bearing Restrictions Per Provider Order: No       Mobility Bed Mobility Overal bed mobility: Needs Assistance Bed Mobility: Sit to Supine       Sit to supine: Supervision   General bed mobility comments: in chair completing breakfast, asked to return to bed at end of session    Transfers Overall transfer level: Needs assistance Equipment used: None Transfers: Sit to/from Stand Sit to Stand: Contact guard assist           General transfer comment: CGA for balance     Balance Overall balance assessment: Needs assistance Sitting-balance support: Feet supported Sitting balance-Leahy Scale: Fair   Postural control: Posterior lean Standing balance support: No upper extremity supported Standing balance-Leahy Scale: Poor Standing balance comment: CGA for balance with mobility and when standing for self car tasks                           ADL either performed or assessed with clinical judgement   ADL Overall ADL's : Needs assistance/impaired Eating/Feeding: Set up;Sitting Eating/Feeding Details (indicate cue type and reason): patient completing breakfast upon entry Grooming: Wash/dry hands;Wash/dry face;Contact guard assist;Standing Grooming Details (indicate cue type and reason): at sink         Upper Body Dressing : Set up;Standing Upper Body Dressing Details (indicate cue type and reason): gown for back Lower Body Dressing: Supervision/safety;Sitting/lateral leans Lower Body Dressing Details (indicate cue  type and reason): changing socks Toilet Transfer: Contact guard assist;Ambulation            Functional mobility during ADLs: Contact guard assist General ADL Comments: no RW for mobility    Extremity/Trunk Assessment              Vision       Perception     Praxis     Communication Communication Communication: Impaired Factors Affecting Communication: Reduced clarity of speech   Cognition Arousal: Alert Behavior During Therapy: Flat affect Cognition: Cognition impaired     Awareness: Online awareness impaired Memory impairment (select all impairments): Working civil service fast streamer, Short-term memory Attention impairment (select first level of impairment): Divided attention Executive functioning impairment (select all impairments): Organization, Sequencing, Reasoning, Problem solving                 [employer] Scales of Cognitive Functioning: Automatic, Appropriate: Minimal Assistance for Daily Living Skills [VII] Following commands: Impaired Following commands impaired: Follows one step commands with increased time, Follows multi-step commands inconsistently, Follows multi-step commands with increased time      Cueing   Cueing Techniques: Verbal cues  Exercises      Shoulder Instructions       General Comments sitter present    Pertinent Vitals/ Pain       Pain Assessment Pain Assessment: No/denies pain Pain Intervention(s): Monitored during session  Home Living                                          Prior Functioning/Environment              Frequency  Min 2X/week        Progress Toward Goals  OT Goals(current goals can now be found in the care plan section)  Progress towards OT goals: Progressing toward goals  Acute Rehab OT Goals Patient Stated Goal: to lie down OT Goal Formulation: With patient Time For Goal Achievement: 09/19/24 Potential to Achieve Goals: Fair ADL Goals Pt Will Perform Grooming: with supervision;standing Pt Will Perform Upper Body Bathing: with supervision;sitting Pt Will  Perform Lower Body Dressing: with min assist;sit to/from stand Pt Will Transfer to Toilet: with supervision;ambulating Pt Will Perform Toileting - Clothing Manipulation and hygiene: with min assist Additional ADL Goal #1: Pt will complete 2-3 step ADL task with supervision.  Plan      Co-evaluation                 AM-PAC OT 6 Clicks Daily Activity     Outcome Measure   Help from another person eating meals?: A Little Help from another person taking care of personal grooming?: A Little Help from another person toileting, which includes using toliet, bedpan, or urinal?: A Little Help from another person bathing (including washing, rinsing, drying)?: A Little Help from another person to put on and taking off regular upper body clothing?: A Little Help from another person to put on and taking off regular lower body clothing?: A Little 6 Click Score: 18    End of Session Equipment Utilized During Treatment: Gait belt;Other (comment) (helmet)  OT Visit Diagnosis: Other abnormalities of gait and mobility (R26.89);Muscle weakness (generalized) (M62.81);Other symptoms and signs involving the nervous system (R29.898);Cognitive communication deficit (R41.841);Other symptoms and signs involving cognitive function   Activity Tolerance Patient tolerated treatment well   Patient Left in bed;with call bell/phone within  reach;with bed alarm set;with nursing/sitter in room   Nurse Communication Mobility status;Precautions        Time: [phone] OT Time Calculation (min): 24 min  Charges: OT General Charges $OT Visit: 1 Visit OT Treatments $Self Care/Home Management : 8-22 mins $Therapeutic Activity: 8-22 mins  Dick Laine, OTA Acute Rehabilitation Services  Office [phone]   Jeb LITTIE Laine 09/12/2024, 9:56 AM

## 2024-09-12 NOTE — Progress Notes (Signed)
 Speech Language Pathology Treatment: Dysphagia  Patient Details Name: Ernest Romero MRN: [id number] DOB: [birth year] Today's Date: 09/12/2024 Time: 8965-8948 SLP Time Calculation (min) (ACUTE ONLY): 17 min  Assessment / Plan / Recommendation Clinical Impression  PLAN: Continue dysphagia 3/thin liquids; meds whole with applesauce if meds can't be crushed. Head turn to left when swallowing.  The weight of larger bites help to open the top of his esophagus (UES).  Pt sitting in recliner with sitter present. Sitter and RN report good intake at breakfast and + toleration of meds in applesauce; occasional coughing noted. During our session he consumed ice cream with occasional cues needed for head turn.    Worked with EMST -  we were able to advance resistance to 25 cm/H20 today with pt reporting increased effort needed.  He completed  3 sets of ten during session.  Discussed recs/progress with RN. SLP will continue to follow.   HPI HPI: Ernest Romero is a 58 y.o. male who was admitted to [facility] 06/30/24 after an unresponsive episode in jail. Dx large SDH; underwent left frontoparietal craniotomy with evacuation of SDH and drain placement 9/28 (drain removed 9/29). Transferred to [facility] for management of crani/flap 10/14. Cranioplasty with bone flap replacement 10/27; worsening neuro with midline shift; underwent  left burr hole for evacuation 10/30. Bone flap removal on 11/10.  Bedside swallow eval 10/28 after change in mentaion and acute change in swallowing; NPO, then advanced to dysphagia 3/thin liquids 10/31.  New orders 11/18 due to coughing with POs - rec continued Dys3/thin liquids. Repeat orders 11/28 due to acute change again in swallow. MBS completed on 11/29 - severe oropharyngeal dysphagia with aspiration of all liquids and puree solids; rec NPO. Repeat MBS 12/4- no significant change. Cortrak placed. [name] 12/1 with evolving postoperative changes from left sided hemicraniectomy and bone flap removal  without evidence of an acute intracranial abnormality or significant residual subdural collection.      SLP Plan  Continue with current plan of care        Swallow Evaluation Recommendations   Recommendations: PO diet PO Diet Recommendation: Dysphagia 3 (Mechanical soft);Thin liquids (Level 0) Liquid Administration via: Cup;Straw Medication Administration: Whole meds with puree Supervision: Patient able to self-feed;Full supervision/cueing for swallowing strategies Oral care recommendations: Oral care BID (2x/day)     Recommendations                     Oral care QID   Frequent or constant Supervision/Assistance Dysphagia, oropharyngeal phase (R13.12)     Continue with current plan of care    Ernest Kader L. Vona, MA CCC/SLP Clinical Specialist - Acute Care SLP Acute Rehabilitation Services Office number [phone]  Ernest Romero  09/12/2024, 10:59 AM

## 2024-09-12 NOTE — Plan of Care (Signed)

## 2024-09-12 NOTE — Plan of Care (Signed)
  Problem: Education: Goal: Knowledge of General Education information will improve Description: Including pain rating scale, medication(s)/side effects and non-pharmacologic comfort measures Outcome: Progressing   Problem: Health Behavior/Discharge Planning: Goal: Ability to manage health-related needs will improve Outcome: Progressing   Problem: Clinical Measurements: Goal: Ability to maintain clinical measurements within normal limits will improve Outcome: Progressing Goal: Will remain free from infection Outcome: Progressing Goal: Diagnostic test results will improve Outcome: Progressing Goal: Respiratory complications will improve Outcome: Progressing Goal: Cardiovascular complication will be avoided Outcome: Progressing   Problem: Activity: Goal: Risk for activity intolerance will decrease Outcome: Progressing   Problem: Nutrition: Goal: Adequate nutrition will be maintained Outcome: Progressing   Problem: Coping: Goal: Level of anxiety will decrease Outcome: Progressing   Problem: Elimination: Goal: Will not experience complications related to bowel motility Outcome: Progressing Goal: Will not experience complications related to urinary retention Outcome: Progressing   Problem: Pain Managment: Goal: General experience of comfort will improve and/or be controlled Outcome: Progressing   Problem: Safety: Goal: Ability to remain free from injury will improve Outcome: Progressing   Problem: Skin Integrity: Goal: Risk for impaired skin integrity will decrease Outcome: Progressing   Problem: Education: Goal: Knowledge of the prescribed therapeutic regimen will improve Outcome: Progressing   Problem: Clinical Measurements: Goal: Usual level of consciousness will be regained or maintained. Outcome: Progressing Goal: Neurologic status will improve Outcome: Progressing Goal: Ability to maintain intracranial pressure will improve Outcome: Progressing    Problem: Skin Integrity: Goal: Demonstration of wound healing without infection will improve Outcome: Progressing

## 2024-09-12 NOTE — Progress Notes (Signed)
°   09/12/24 1120  Safety Observation   Observer at bedside Yes  Additional Interventions Bed alarm activated;Diversional activity;Cover drains/lines

## 2024-09-13 LAB — BASIC METABOLIC PANEL WITH GFR
Anion gap: 5 (ref 5–15)
BUN: 17 mg/dL (ref 6–20)
CO2: 27 mmol/L (ref 22–32)
Calcium: 9.1 mg/dL (ref 8.9–10.3)
Chloride: 108 mmol/L (ref 98–111)
Creatinine, Ser: 0.89 mg/dL (ref 0.61–1.24)
GFR, Estimated: >60 mL/min (ref >60)
Glucose, Bld: 94 mg/dL (ref 70–99)
Potassium: 3.9 mmol/L (ref 3.5–5.1)
Sodium: 140 mmol/L (ref 135–145)

## 2024-09-13 LAB — VANCOMYCIN, TROUGH: Vancomycin Tr: 17 ug/mL (ref 15–20)

## 2024-09-13 LAB — GLUCOSE, CAPILLARY: Glucose-Capillary: 111 mg/dL — ABNORMAL HIGH (ref 70–99)

## 2024-09-13 MED ORDER — VANCOMYCIN HCL IN DEXTROSE 1-5 GM/200ML-% IV SOLN
1000.0000 mg | Freq: Two times a day (BID) | INTRAVENOUS | Status: AC
  Administered 2024-09-13 – 2024-09-23 (×22): 1000 mg via INTRAVENOUS
  Filled 2024-09-13 (×22): qty 200

## 2024-09-13 NOTE — Plan of Care (Signed)
  Problem: Education: Goal: Knowledge of General Education information will improve Description: Including pain rating scale, medication(s)/side effects and non-pharmacologic comfort measures Outcome: Progressing   Problem: Health Behavior/Discharge Planning: Goal: Ability to manage health-related needs will improve Outcome: Progressing   Problem: Clinical Measurements: Goal: Ability to maintain clinical measurements within normal limits will improve Outcome: Progressing Goal: Will remain free from infection Outcome: Progressing Goal: Diagnostic test results will improve Outcome: Progressing Goal: Respiratory complications will improve Outcome: Progressing Goal: Cardiovascular complication will be avoided Outcome: Progressing   Problem: Activity: Goal: Risk for activity intolerance will decrease Outcome: Progressing   Problem: Nutrition: Goal: Adequate nutrition will be maintained Outcome: Progressing   Problem: Coping: Goal: Level of anxiety will decrease Outcome: Progressing   Problem: Elimination: Goal: Will not experience complications related to bowel motility Outcome: Progressing Goal: Will not experience complications related to urinary retention Outcome: Progressing   Problem: Pain Managment: Goal: General experience of comfort will improve and/or be controlled Outcome: Progressing   Problem: Safety: Goal: Ability to remain free from injury will improve Outcome: Progressing   Problem: Skin Integrity: Goal: Risk for impaired skin integrity will decrease Outcome: Progressing   Problem: Education: Goal: Knowledge of the prescribed therapeutic regimen will improve Outcome: Progressing   Problem: Clinical Measurements: Goal: Usual level of consciousness will be regained or maintained. Outcome: Progressing Goal: Neurologic status will improve Outcome: Progressing Goal: Ability to maintain intracranial pressure will improve Outcome: Progressing    Problem: Skin Integrity: Goal: Demonstration of wound healing without infection will improve Outcome: Progressing

## 2024-09-13 NOTE — Progress Notes (Signed)
 PROGRESS NOTE    Ernest Romero  FMW:[id number] DOB: 04-12-1966 DOA: 07/15/2024 PCP: [name]   Brief Narrative:  58 year old homeless, medical history significant for alcohol use disorder, bipolar and panic disorder, initially presented to [name] from jail in September 2025 with a large subdural hematoma.  Underwent craniotomy with flap replacement on 10/27.  Since then multiple OR visits including 10/30 for SDH evacuation, follow-up CT head 11/8 showed fluid collection underneath the bone flap, back to the OR 11/10 and then bone flap was removed, cultures were obtained intraoperative growing MRSA, treating for infected bone flap, ID following and currently on IV vancomycin .   10/27 crani w/ flap replacement 10/29: off clevi, liberate SBP goal, Klonopin , lithium , Haldol  as needed discontinued.  Latuda  dose cut by half 10/30: repeat CT head with increased collection - going to OR this afternoon for evacuation 10/31: OR  for SDH evacuation.  Developed arm twitching.  Klonopin  restarted at half dose 11/1: More awake.  No complaint 11/2 transferred out of ICU. 11/4 cortrak removed 11/8 CT head showed fluid collection under bone flap, decision made to remove bone flap 11/10 to OR for L bone flap removal 11/11 repeat CT head stable 11/29: notice to have worsening Dysphagia.  12/1 repeat CT head: stable.  12/3: Cortrack placed for nutrition, sutures removed by neurosurgery.  Tube feeds started. 12/4: Very sedated, psychiatry requested to readjust meds, reduced clonazepam  to0.5mg  BID, decreased valbenazine  to 40 mg daily 12/5: Noted oriented but jaw tremors worse.  Evaluated by neurology, recommended psych to continue to follow and adjust medications  Assessment & Plan:   Principal Problem:   Subdural hematoma (HCC) Active Problems:   Bipolar disorder (HCC)   Unable to make decisions about medical treatment due to impaired mental capacity   Alcohol withdrawal  (HCC)   Essential hypertension   Protein-calorie malnutrition, severe   Acute metabolic encephalopathy   Infection of craniotomy plate  Subdural hematoma /  Status post initial craniotomy 9/28 (Dr. 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 - Followed by neurosurgery. -Evaluated by infectious disease who recommends 6 weeks of IV vancomycin , EOT 09/23/2024.  After completion of IV antibiotics he will need 8 weeks of doxycycline . - Will need PICC line placed closer to discharge date. - Completed 1 week of Keppra  -  sutures were removed without complication by neurosurgery on 12/3.  EVD sutures remain intact, no signs of drainage or erythema, recommended do not remove at this time     -Acute metabolic encephalopathy - At baseline, Ernest Romero reportedly without cognitive impairment, was homeless and independent caring for self. - At some point, during this hospitalization Ernest Romero was deemed not to have capacity, due to encephalopathy and neurological deficits. Per recent physicians notes Ernest Romero appears to be able to make decision for himself moving forward. Continue to monitor, capacity could fluctuates.  -  Ernest Romero was too oversedated before and psychiatry readjusted medications to reduce clonazepam  and valbenazine .  Subsequently became alert, agitated with worsening jaw tremor - appreciate neurology, Dr Lindzen for evaluating and recommendations, psych will need to continue following and make necessary adjustments to the regimen - Placed OT consult. Jaw tremors alleviates temporarily with distraction   -Was somewhat anxious and agitated on 12/7 AM, increased Klonopin  to 0.5 mg 3 times daily - Currently alert and oriented, cortrack removed on 12/8, Ernest Romero tolerating diet without difficulty, appears close  to his baseline mental status.     Bipolar disorder/akathisia/drug-induced parkinsonism: -  Ernest Romero with long standing history of bipolar disorder. - Ernest Romero was on Haldol  and Latuda , developed facial tremors 11/20 concern for tardive dyskinesia. - EEG negative for seizures. - Latuda  and Haldol  discontinued. - Ernest Romero was started on propranolol  and Klonopin .  On Ingrezza  -Due to oversedation, Klonopin  was decreased to 0.5 mg twice daily (from 1 mg 3 times daily) and valbenazine  decreased to 40 mg daily on 12/4 -  appreciate neurology, Dr Lindzen for evaluating and recommendations, psych will need to continue following and make necessary adjustments to the regimen Currently alert and oriented, management per psych   Dysphagia 11/29: Overnight after medications given with water  Ernest Romero felt something got stuck, he require suctioning.  -evaluated by speech. MBS: sign of aspiration.   CT head. Stable. - Alert and oriented, core track removed on 12/8, tolerating diet and meds.   Aspiration PNA;  Acute hypoxic Resp failure.  -In setting of dysphagia and aspiration   - Completed 5 days of IV Unasyn    history of alcohol, tobacco use disorder: - Continue thiamine , folic acid  and multivitamin -Treated for alcohol withdrawal delirium with phenobarbital  taper at [facility]. Resolved   Essential hypertension: - BP stable  Amlodipine  and losartan  has been discontinued. Propranolol  dose decreased to avoid hypotension   Constipation: Continue laxatives.    Bradycardia - Holding parameters on for propranolol     Severe malnutrition: Continue ensure.  -Started on tube feeds but now tolerating diet.   Iron deficiency anemia; started  iron supplement.    Oral Thrush; he was on nystatin  since last 13 days.  No oral thrush noted on 09/11/2024 and nystatin  discontinued.   Protein calorie malnutrition Nutrition Problem: Severe Malnutrition Etiology: acute illness Signs/Symptoms: severe muscle depletion, moderate muscle depletion Interventions: Ensure Enlive (each supplement provides 350kcal  and 20 grams of protein), MVI  DVT prophylaxis: heparin  injection 5,000 Units Start: 08/13/24 1400 SCDs Start: 08/12/24 1150 Place TED hose Start: 08/12/24 1150 SCDs Start: 08/01/24 1745 Place and maintain sequential compression device Start: 07/29/24 1819   Code Status: Full Code  Family Communication:  None present at bedside.  Plan of care discussed with Ernest Romero in length and he/she verbalized understanding and agreed with it.  Status is: Inpatient Remains inpatient appropriate because: Ernest Romero medically stable, pending placement to SNF.   Estimated body mass index is 21.16 kg/m as calculated from the following:   Height as of this encounter: 5' 10 (1.778 m).   Weight as of this encounter: 66.9 kg.    Nutritional Assessment: Body mass index is 21.16 kg/m.SABRA Seen by dietician.  I agree with the assessment and plan as outlined below: Nutrition Status: Nutrition Problem: Severe Malnutrition Etiology: acute illness Signs/Symptoms: severe muscle depletion, moderate muscle depletion Interventions: Ensure Enlive (each supplement provides 350kcal and 20 grams of protein), MVI  . Skin Assessment: I have examined the Ernest Romero's skin and I agree with the wound assessment as performed by the wound care RN as outlined below:    Consultants:  Neurology, psychiatry, neurosurgery  Procedures:  As above  Antimicrobials:  Anti-infectives (From admission, onward)    Start     Dose/Rate Route Frequency Ordered Stop   09/02/24 1700  Ampicillin -Sulbactam (UNASYN ) 3 g in sodium chloride  0.9 % 100 mL IVPB        3 g 200 mL/hr over 30 Minutes Intravenous Every 6 hours 09/02/24 1611 09/07/24 1230   08/31/24 1000  vancomycin  (VANCOREADY) IVPB 750  mg/150 mL        750 mg 150 mL/hr over 60 Minutes Intravenous Every 12 hours 08/31/24 0015     08/21/24 0800  vancomycin  (VANCOCIN ) IVPB 1000 mg/200 mL premix  Status:  Discontinued        1,000 mg 200 mL/hr over 60 Minutes Intravenous Every 12  hours 08/21/24 0720 08/31/24 0015   08/19/24 0600  vancomycin  (VANCOREADY) IVPB 1250 mg/250 mL  Status:  Discontinued        1,250 mg 166.7 mL/hr over 90 Minutes Intravenous Every 12 hours 08/18/24 1818 08/21/24 0547   08/18/24 1830  vancomycin  (VANCOREADY) IVPB 500 mg/100 mL        500 mg 100 mL/hr over 60 Minutes Intravenous  Once 08/18/24 1817 08/19/24 1642   08/16/24 1600  vancomycin  (VANCOREADY) IVPB 750 mg/150 mL  Status:  Discontinued        750 mg 150 mL/hr over 60 Minutes Intravenous Every 8 hours 08/16/24 0828 08/18/24 1818   08/14/24 0800  vancomycin  (VANCOCIN ) IVPB 1000 mg/200 mL premix  Status:  Discontinued        1,000 mg 200 mL/hr over 60 Minutes Intravenous Every 8 hours 08/14/24 0016 08/16/24 0828   08/14/24 0030  vancomycin  (VANCOCIN ) IVPB 1000 mg/200 mL premix        1,000 mg 200 mL/hr over 60 Minutes Intravenous NOW 08/14/24 0016 08/14/24 0135   08/13/24 0100  vancomycin  (VANCOREADY) IVPB 750 mg/150 mL  Status:  Discontinued        750 mg 150 mL/hr over 60 Minutes Intravenous Every 12 hours 08/12/24 1230 08/14/24 0007   08/12/24 1400  ceFAZolin  (ANCEF ) IVPB 2g/100 mL premix  Status:  Discontinued        2 g 200 mL/hr over 30 Minutes Intravenous Every 8 hours 08/12/24 1149 08/12/24 1230   08/12/24 1400  cefTAZidime  (FORTAZ ) 2 g in sodium chloride  0.9 % 100 mL IVPB  Status:  Discontinued        2 g 200 mL/hr over 30 Minutes Intravenous Every 8 hours 08/12/24 1152 08/13/24 1109   08/12/24 1245  ceFAZolin  (ANCEF ) IVPB 2g/100 mL premix  Status:  Discontinued        2 g 200 mL/hr over 30 Minutes Intravenous  Once 08/12/24 1149 08/12/24 1449   08/12/24 1245  vancomycin  (VANCOREADY) IVPB 1250 mg/250 mL        1,250 mg 166.7 mL/hr over 90 Minutes Intravenous  Once 08/12/24 1152 08/12/24 1501   08/12/24 0957  vancomycin  (VANCOCIN ) powder  Status:  Discontinued          As needed 08/12/24 1008 08/12/24 1037   08/01/24 2200  ceFAZolin  (ANCEF ) IVPB 2g/100 mL premix        2  g 200 mL/hr over 30 Minutes Intravenous Every 8 hours 08/01/24 1703 08/02/24 1915   08/01/24 1530  vancomycin  (VANCOCIN ) powder  Status:  Discontinued          As needed 08/01/24 1559 08/01/24 1606   08/01/24 1421  ceFAZolin  (ANCEF ) 2-4 GM/100ML-% IVPB       Note to Pharmacy: Roslynn Birmingham: cabinet override      08/01/24 1421 08/02/24 0229   07/29/24 2200  ceFAZolin  (ANCEF ) IVPB 2g/100 mL premix        2 g 200 mL/hr over 30 Minutes Intravenous Every 8 hours 07/29/24 1859 07/30/24 1423   07/29/24 1654  vancomycin  (VANCOCIN ) powder  Status:  Discontinued          As needed  07/29/24 1655 07/29/24 1758   07/29/24 1515  ceFAZolin  (ANCEF ) IVPB 2g/100 mL premix        2 g 200 mL/hr over 30 Minutes Intravenous  Once 07/29/24 1421 07/29/24 1655         Subjective: Ernest Romero seen and examined.  No complaints.  Sitter at the bedside.  Objective: Vitals:   09/12/24 1959 09/13/24 0017 09/13/24 0500 09/13/24 0725  BP: 125/80 (!) 123/111  102/68  Pulse: 60 62  60  Resp: 20 20  17   Temp: 98 F (36.7 C) 98 F (36.7 C)  98.1 F (36.7 C)  TempSrc: Oral Oral  Oral  SpO2: 97% 96%  93%  Weight:   66.9 kg   Height:        Intake/Output Summary (Last 24 hours) at 09/13/2024 0753 Last data filed at 09/12/2024 2211 Gross per 24 hour  Intake 716 ml  Output 1125 ml  Net -409 ml   Filed Weights   09/11/24 0500 09/12/24 0500 09/13/24 0500  Weight: 65.2 kg 66 kg 66.9 kg    Examination:  General exam: Appears calm and comfortable  Respiratory system: Clear to auscultation. Respiratory effort normal. Cardiovascular system: S1 & S2 heard, RRR. No JVD, murmurs, rubs, gallops or clicks. No pedal edema. Gastrointestinal system: Abdomen is nondistended, soft and nontender. No organomegaly or masses felt. Normal bowel sounds heard. Central nervous system: Alert and oriented. No focal neurological deficits. Extremities: Symmetric 5 x 5 power. Skin: No rashes, lesions or ulcers  Data Reviewed: I  have personally reviewed following labs and imaging studies  CBC: Recent Labs  Lab 09/08/24 0558 09/11/24 0918  WBC 2.8* 4.1  HGB 11.5* 12.6*  HCT 35.0* 39.1  MCV 89.3 91.1  PLT 195 202   Basic Metabolic Panel: Recent Labs  Lab 09/08/24 0558 09/11/24 0918 09/13/24 0524  NA 142 143 140  K 4.0 4.2 3.9  CL 110 109 108  CO2 22 25 27   GLUCOSE 111* 128* 94  BUN 13 18 17   CREATININE 0.79 1.07 0.89  CALCIUM 8.8* 9.6 9.1   GFR: Estimated Creatinine Clearance: 83.5 mL/min (by C-G formula based on SCr of 0.89 mg/dL). Liver Function Tests: No results for input(s): AST, ALT, ALKPHOS, BILITOT, PROT, ALBUMIN  in the last 168 hours. No results for input(s): LIPASE, AMYLASE in the last 168 hours. No results for input(s): AMMONIA in the last 168 hours. Coagulation Profile: No results for input(s): INR, PROTIME in the last 168 hours. Cardiac Enzymes: No results for input(s): CKTOTAL, CKMB, CKMBINDEX, TROPONINI in the last 168 hours. BNP (last 3 results) No results for input(s): PROBNP in the last 8760 hours. HbA1C: No results for input(s): HGBA1C in the last 72 hours. CBG: Recent Labs  Lab 09/12/24 0522 09/12/24 1125 09/12/24 1616 09/12/24 1935 09/13/24 0018  GLUCAP 102* 115* 115* 113* 111*   Lipid Profile: No results for input(s): CHOL, HDL, LDLCALC, TRIG, CHOLHDL, LDLDIRECT in the last 72 hours. Thyroid Function Tests: No results for input(s): TSH, T4TOTAL, FREET4, T3FREE, THYROIDAB in the last 72 hours. Anemia Panel: No results for input(s): VITAMINB12, FOLATE, FERRITIN, TIBC, IRON, RETICCTPCT in the last 72 hours. Sepsis Labs: No results for input(s): PROCALCITON, LATICACIDVEN in the last 168 hours.  No results found for this or any previous visit (from the past 240 hours).   Radiology Studies: No results found.   Scheduled Meds:  benztropine   1 mg Oral BID   Chlorhexidine  Gluconate Cloth  6  each Topical Daily   clonazePAM   0.5 mg Oral TID   docusate  100 mg Oral Daily   feeding supplement  237 mL Oral BID BM   ferrous sulfate   325 mg Oral Q breakfast   folic acid   1 mg Oral Daily   gabapentin   200 mg Oral TID   heparin  injection (subcutaneous)  5,000 Units Subcutaneous Q8H   mirtazapine   15 mg Oral QHS   multivitamin with minerals  1 tablet Oral Daily   nicotine   14 mg Transdermal Daily   pantoprazole   40 mg Oral QHS   polyethylene glycol  17 g Oral Daily   propranolol   20 mg Oral BID   senna  2 tablet Oral Daily   sodium chloride  flush  10-40 mL Intracatheter Q12H   thiamine   100 mg Oral Daily   valbenazine   40 mg Oral Daily   Continuous Infusions:  lactated ringers  75 mL/hr at 09/12/24 2224   vancomycin  750 mg (09/12/24 2220)     LOS: 60 days   Fredia Skeeter, MD [facility]  09/13/2024, 7:53 AM   *Please note that this is a verbal dictation therefore any spelling or grammatical errors are due to the Dragon Medical One system interpretation.  Please page via Amion and do not message via secure chat for urgent Ernest Romero care matters. Secure chat can be used for non urgent Ernest Romero care matters.  How to contact the [facility] Attending or Consulting provider 7A - 7P or covering provider during after hours 7P -7A, for this Ernest Romero?  Check the care team in [facility] and look for a) attending/consulting [facility] provider listed and b) the [facility] team listed. Page or secure chat 7A-7P. Log into [email or web address] and use [facility]'s universal password to access. If you do not have the password, please contact the hospital operator. Locate the [facility] provider you are looking for under [facility] and page to a number that you can be directly reached. If you still have difficulty reaching the provider, please page the [facility] (Director on Call) for the Hospitalists listed on amion for assistance.

## 2024-09-13 NOTE — Progress Notes (Signed)
 Pharmacy Antibiotic Note  Ernest Romero is a 58 y.o. 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 Dosing for CNS penetration with target troughs 15-20 mcg/ml. Per ID, plan 6 weeks of Vancomycin  (thru 09/23/24).   Vancomycin  trough today 17 but was drawn 4 hrs early - true trough ~12.8 and subtherapeutic. Renal function stable.  Plan: Increase vancomycin  1000 mg IV q12h  Monitor renal function Trough at new steady-state  Height: 5' 10 (177.8 cm) Weight: 66.9 kg (147 lb 7.8 oz) IBW/kg (Calculated) : 73  Temp (24hrs), Avg:98.1 F (36.7 C), Min:97.9 F (36.6 C), Max:98.6 F (37 C)  Recent Labs  Lab 09/06/24 1022 09/08/24 0558 09/11/24 0918 09/13/24 0524  WBC  --  2.8* 4.1  --   CREATININE  --  0.79 1.07 0.89  VANCOTROUGH 15  --   --  17    Estimated Creatinine Clearance: 83.5 mL/min (by C-G formula based on SCr of 0.89 mg/dL).    No Known Allergies  Antimicrobials this admission: Vancomycin  11/10 > Ceftazidime  11/10 >11/11   Dose adjustments this admission: 11/11 VT = 9 (true trough 7.6) on 750mg  q12 >> incr to 1g q8 11/14 VT = 23 >> decr 750mg  q8 11/16 VT = 11 >> adj to 1250mg  q12 11/19 VT = 24 >> dec to 1 gm IV q12h 11/21 VT=  19 mcg/ml - continue same  11/29 V P/T 29/20>>dec 750 IV q12h 12/5 VT = 15 - > continue same 12/12 VT = 17 (true trough 12.8) on 750 mg q12 > incr to 1000 mg q12  Microbiology results: 10/27 MRSA PCR: positive 11/10 bone flap (tissue) x 2: one moderate and one abundant MRSA, MIC to Vanc 1  Thank you for involving pharmacy in this patient's care.  Delon Sax, PharmD, BCPS Clinical Pharmacist 09/13/2024 8:36 AM

## 2024-09-13 NOTE — Progress Notes (Signed)
 Physical Therapy Treatment Patient Details Name: Ernest Romero MRN: [id number] DOB: 11-26-1965 Today's Date: 09/13/2024   History of Present Illness Pt is a 58 y.o. male presenting 10/14 from [facility] where he was admitted 9/28 for unresponsive episode in the jail. 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 On CIWA protocol. Transferred to [facility] for further management of craniotomy and flap. 10/27 s/p cranioplasty with bone flap replacement. 10/28 Change in status thought to be related to Klonopin  administration 10/28, repeat CTH shows significant increase in size of mixed attenuation subdural fluid collection underlying the L tempoparietal craniotomy site, now measuring approximately 15 mm in thickness, with associated mild mass effect and slight rightward midline shift. OR on 10/30 for SDH evacuation. [facility] 11/7 with fluid collected under bone flap. 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 PMH alcohol use disorder, psychiatric disorder, HTN    PT Comments  Pt is currently presenting at supervision for bed mobility, CGA for sit to stand, CGA for gait up to Min A with activities that challenge balance. Worked on improved gait pattern with larger/wider steps in order to decrease risk for falls. Due to pt current functional status, home set up and available assistance at home recommending skilled physical therapy services < 3 hours/day in order to address strength, balance and functional mobility to decrease risk for falls, injury, immobility, skin break down and re-hospitalization.      If plan is discharge home, recommend the following: A lot of help with bathing/dressing/bathroom;Assistance with cooking/housework;Direct supervision/assist for medications management;Direct supervision/assist for financial management;Assist for transportation;Help with stairs or ramp for entrance;Supervision due to cognitive  status;A little help with walking and/or transfers   Can travel by private vehicle     Yes  Equipment Recommendations  Wheelchair (measurements PT);Wheelchair cushion (measurements PT);Rolling walker (2 wheels)       Precautions / Restrictions Precautions Precautions: Fall Recall of Precautions/Restrictions: Impaired Precaution/Restrictions Comments: SBP <160, helmet (pt to wear when OOB per RN) Restrictions Weight Bearing Restrictions Per Provider Order: No     Mobility  Bed Mobility Overal bed mobility: Needs Assistance Bed Mobility: Sit to Supine, Supine to Sit     Supine to sit: Supervision Sit to supine: Supervision        Transfers Overall transfer level: Needs assistance Equipment used: None Transfers: Sit to/from Stand Sit to Stand: Contact guard assist           General transfer comment: CGA for balance    Ambulation/Gait Ambulation/Gait assistance: Min assist, Contact guard assist Gait Distance (Feet): 300 Feet Assistive device: None Gait Pattern/deviations: Step-through pattern, Decreased stride length, Shuffle Gait velocity: decreased Gait velocity interpretation: 1.31 - 2.62 ft/sec, indicative of limited community ambulator   General Gait Details: worked on larger steps, wider steps with improved gait pattern.       Balance Overall balance assessment: Needs assistance Sitting-balance support: Feet supported Sitting balance-Leahy Scale: Fair   Postural control: Posterior lean Standing balance support: No upper extremity supported Standing balance-Leahy Scale: Fair Standing balance comment: CGA for balance with mobility worked on posterior gait, side stepping R/L and SLS with large step in order to improve fluidity of gait with improved balance. Up to Min A with balance challenging activities.        Communication Communication Communication: Impaired Factors Affecting Communication: Reduced clarity of speech  Cognition Arousal:  Alert Behavior During Therapy: Flat affect   PT -  Cognitive impairments: Memory, Problem solving, Safety/Judgement, Sequencing     Rancho Levels of Cognitive Functioning Rancho Los Amigos Scales of Cognitive Functioning: Automatic, Appropriate: Minimal Assistance for Daily Living Skills Rancho Los Amigos Scales of Cognitive Functioning: Automatic, Appropriate: Minimal Assistance for Daily Living Skills [VII] PT - Cognition Comments: pt continues to be aggravated by jaw tremor Following commands: Impaired Following commands impaired: Follows one step commands with increased time, Follows multi-step commands inconsistently, Follows multi-step commands with increased time    Cueing Cueing Techniques: Verbal cues     General Comments General comments (skin integrity, edema, etc.): sitter present.      Pertinent Vitals/Pain Pain Assessment Pain Assessment: No/denies pain     PT Goals (current goals can now be found in the care plan section) Acute Rehab PT Goals Patient Stated Goal: none stated PT Goal Formulation: With patient Time For Goal Achievement: 09/23/24 Potential to Achieve Goals: Fair Progress towards PT goals: Progressing toward goals    Frequency    Min 2X/week      PT Plan  Continue with current POC        AM-PAC PT 6 Clicks Mobility   Outcome Measure  Help needed turning from your back to your side while in a flat bed without using bedrails?: A Little Help needed moving from lying on your back to sitting on the side of a flat bed without using bedrails?: A Little Help needed moving to and from a bed to a chair (including a wheelchair)?: A Little Help needed standing up from a chair using your arms (e.g., wheelchair or bedside chair)?: A Little Help needed to walk in hospital room?: A Little Help needed climbing 3-5 steps with a railing? : A Lot 6 Click Score: 17    End of Session Equipment Utilized During Treatment: Gait belt;Other (comment)  (helmet) Activity Tolerance: Patient tolerated treatment well Patient left: in bed;with call bell/phone within reach;with bed alarm set;with nursing/sitter in room Nurse Communication: Mobility status PT Visit Diagnosis: Other symptoms and signs involving the nervous system (R29.898);Muscle weakness (generalized) (M62.81)     Time: 8558-8495 PT Time Calculation (min) (ACUTE ONLY): 23 min  Charges:    $Gait Training: 8-22 mins $Neuromuscular Re-education: 8-22 mins PT General Charges $$ ACUTE PT VISIT: 1 Visit                     Dorothyann Maier, DPT, CLT  Acute Rehabilitation Services Office: [phone] (Secure chat preferred)    Dorothyann VEAR Maier 09/13/2024, 5:18 PM

## 2024-09-14 LAB — GLUCOSE, CAPILLARY: Glucose-Capillary: 111 mg/dL — ABNORMAL HIGH (ref 70–99)

## 2024-09-14 NOTE — Plan of Care (Signed)
  Problem: Education: Goal: Knowledge of General Education information will improve Description: Including pain rating scale, medication(s)/side effects and non-pharmacologic comfort measures Outcome: Progressing   Problem: Health Behavior/Discharge Planning: Goal: Ability to manage health-related needs will improve Outcome: Progressing   Problem: Clinical Measurements: Goal: Ability to maintain clinical measurements within normal limits will improve Outcome: Progressing Goal: Will remain free from infection Outcome: Progressing Goal: Diagnostic test results will improve Outcome: Progressing Goal: Respiratory complications will improve Outcome: Progressing Goal: Cardiovascular complication will be avoided Outcome: Progressing   Problem: Activity: Goal: Risk for activity intolerance will decrease Outcome: Progressing   Problem: Nutrition: Goal: Adequate nutrition will be maintained Outcome: Progressing   Problem: Coping: Goal: Level of anxiety will decrease Outcome: Progressing   Problem: Elimination: Goal: Will not experience complications related to bowel motility Outcome: Progressing Goal: Will not experience complications related to urinary retention Outcome: Progressing   Problem: Pain Managment: Goal: General experience of comfort will improve and/or be controlled Outcome: Progressing   Problem: Safety: Goal: Ability to remain free from injury will improve Outcome: Progressing   Problem: Skin Integrity: Goal: Risk for impaired skin integrity will decrease Outcome: Progressing   Problem: Education: Goal: Knowledge of the prescribed therapeutic regimen will improve Outcome: Progressing   Problem: Clinical Measurements: Goal: Usual level of consciousness will be regained or maintained. Outcome: Progressing Goal: Neurologic status will improve Outcome: Progressing Goal: Ability to maintain intracranial pressure will improve Outcome: Progressing    Problem: Skin Integrity: Goal: Demonstration of wound healing without infection will improve Outcome: Progressing

## 2024-09-14 NOTE — Progress Notes (Signed)
 PROGRESS NOTE    Ernest DEPREE  FMW:[id number] DOB: 18-May-1966 DOA: 07/15/2024 PCP: [name]   Brief Narrative:  58 year old homeless, medical history significant for alcohol use disorder, bipolar and panic disorder, initially presented to [facility] from jail in September 2025 with a large subdural hematoma.  Underwent craniotomy with flap replacement on 10/27.  Since then multiple OR visits including 10/30 for [facility] evacuation, follow-up CT head 11/8 showed fluid collection underneath the bone flap, back to the OR 11/10 and then bone flap was removed, cultures were obtained intraoperative growing MRSA, treating for infected bone flap, ID following and currently on IV vancomycin .   10/27 crani w/ flap replacement 10/29: off clevi, liberate SBP goal, Klonopin , lithium , Haldol  as needed discontinued.  Latuda  dose cut by half 10/30: repeat CT head with increased collection - going to OR this afternoon for evacuation 10/31: OR  for [facility] evacuation.  Developed arm twitching.  Klonopin  restarted at half dose 11/1: More awake.  No complaint 11/2 transferred out of ICU. 11/4 cortrak removed 11/8 CT head showed fluid collection under bone flap, decision made to remove bone flap 11/10 to OR for L bone flap removal 11/11 repeat CT head stable 11/29: notice to have worsening Dysphagia.  12/1 repeat CT head: stable.  12/3: Cortrack placed for nutrition, sutures removed by neurosurgery.  Tube feeds started. 12/4: Very sedated, psychiatry requested to readjust meds, reduced clonazepam  to0.5mg  BID, decreased valbenazine  to 40 mg daily 12/5: Noted oriented but jaw tremors worse.  Evaluated by neurology, recommended psych to continue to follow and adjust medications  Assessment & Plan:   Principal Problem:   Subdural hematoma (HCC) Active Problems:   Bipolar disorder (HCC)   Unable to make decisions about medical treatment due to impaired mental capacity   Alcohol withdrawal  (HCC)   Essential hypertension   Protein-calorie malnutrition, severe   Acute metabolic encephalopathy   Infection of craniotomy plate  Subdural hematoma /  Status post initial craniotomy 9/28 (Dr. 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 - Followed by neurosurgery. -Evaluated by infectious disease who recommends 6 weeks of IV vancomycin , EOT 09/23/2024.  After completion of IV antibiotics he will need 8 weeks of doxycycline . - Will need PICC line placed closer to discharge date. - Completed 1 week of Keppra  -  sutures were removed without complication by neurosurgery on 12/3.  EVD sutures remain intact, no signs of drainage or erythema, recommended do not remove at this time     -Acute metabolic encephalopathy - At baseline, patient reportedly without cognitive impairment, was homeless and independent caring for self. - At some point, during this hospitalization patient was deemed not to have capacity, due to encephalopathy and neurological deficits. Per recent physicians notes patient appears to be able to make decision for himself moving forward. Continue to monitor, capacity could fluctuates.  -  Patient was too oversedated before and psychiatry readjusted medications to reduce clonazepam  and valbenazine .  Subsequently became alert, agitated with worsening jaw tremor - appreciate neurology, Dr Lindzen for evaluating and recommendations, psych will need to continue following and make necessary adjustments to the regimen - Placed OT consult. Jaw tremors alleviates temporarily with distraction   -Was somewhat anxious and agitated on 12/7 AM, increased Klonopin  to 0.5 mg 3 times daily - Currently alert and oriented, cortrack removed on 12/8, patient tolerating diet without difficulty, appears close  to his baseline mental status.     Bipolar disorder/akathisia/drug-induced parkinsonism: -  Patient with long standing history of bipolar disorder. - Patient was on Haldol  and Latuda , developed facial tremors 11/20 concern for tardive dyskinesia. - EEG negative for seizures. - Latuda  and Haldol  discontinued. - Patient was started on propranolol  and Klonopin .  On Ingrezza  -Due to oversedation, Klonopin  was decreased to 0.5 mg twice daily (from 1 mg 3 times daily) and valbenazine  decreased to 40 mg daily on 12/4 -  appreciate neurology, Dr Lindzen for evaluating and recommendations, psych will need to continue following and make necessary adjustments to the regimen Currently alert and oriented, management per psych   Dysphagia 11/29: Overnight after medications given with water  patient felt something got stuck, he require suctioning.  -evaluated by speech. MBS: sign of aspiration.   CT head. Stable. - Alert and oriented, core track removed on 12/8, tolerating diet and meds.   Aspiration PNA;  Acute hypoxic Resp failure.  -In setting of dysphagia and aspiration   - Completed 5 days of IV Unasyn    history of alcohol, tobacco use disorder: - Continue thiamine , folic acid  and multivitamin -Treated for alcohol withdrawal delirium with phenobarbital  taper at [facility]. Resolved   Essential hypertension: - BP stable  Amlodipine  and losartan  has been discontinued. Propranolol  dose decreased to avoid hypotension   Constipation: Continue laxatives.    Bradycardia - Holding parameters on for propranolol     Severe malnutrition: Continue ensure.  -Started on tube feeds but now tolerating diet.   Iron deficiency anemia; started  iron supplement.    Oral Thrush; he was on nystatin  since last 13 days.  No oral thrush noted on 09/11/2024 and nystatin  discontinued.   Protein calorie malnutrition Nutrition Problem: Severe Malnutrition Etiology: acute illness Signs/Symptoms: severe muscle depletion, moderate muscle depletion Interventions: Ensure Enlive (each supplement provides 350kcal  and 20 grams of protein), MVI  DVT prophylaxis: heparin  injection 5,000 Units Start: 08/13/24 1400 SCDs Start: 08/12/24 1150 Place TED hose Start: 08/12/24 1150 SCDs Start: 08/01/24 1745 Place and maintain sequential compression device Start: 07/29/24 1819   Code Status: Full Code  Family Communication:  None present at bedside.  Plan of care discussed with patient in length and he/she verbalized understanding and agreed with it.  Status is: Inpatient Remains inpatient appropriate because: Patient medically stable, pending placement to SNF.   Estimated body mass index is 21.08 kg/m as calculated from the following:   Height as of this encounter: 5' 10 (1.778 m).   Weight as of this encounter: 66.6 kg.    Nutritional Assessment: Body mass index is 21.08 kg/m.SABRA Seen by dietician.  I agree with the assessment and plan as outlined below: Nutrition Status: Nutrition Problem: Severe Malnutrition Etiology: acute illness Signs/Symptoms: severe muscle depletion, moderate muscle depletion Interventions: Ensure Enlive (each supplement provides 350kcal and 20 grams of protein), MVI  . Skin Assessment: I have examined the patient's skin and I agree with the wound assessment as performed by the wound care RN as outlined below:    Consultants:  Neurology, psychiatry, neurosurgery  Procedures:  As above  Antimicrobials:  Anti-infectives (From admission, onward)    Start     Dose/Rate Route Frequency Ordered Stop   09/13/24 1000  vancomycin  (VANCOCIN ) IVPB 1000 mg/200 mL premix        1,000 mg 200 mL/hr over 60 Minutes Intravenous Every 12 hours 09/13/24 0835     09/02/24 1700  Ampicillin -Sulbactam (UNASYN ) 3 g in sodium chloride  0.9 %  100 mL IVPB        3 g 200 mL/hr over 30 Minutes Intravenous Every 6 hours 09/02/24 1611 09/07/24 1230   08/31/24 1000  vancomycin  (VANCOREADY) IVPB 750 mg/150 mL  Status:  Discontinued        750 mg 150 mL/hr over 60 Minutes Intravenous Every 12  hours 08/31/24 0015 09/13/24 0835   08/21/24 0800  vancomycin  (VANCOCIN ) IVPB 1000 mg/200 mL premix  Status:  Discontinued        1,000 mg 200 mL/hr over 60 Minutes Intravenous Every 12 hours 08/21/24 0720 08/31/24 0015   08/19/24 0600  vancomycin  (VANCOREADY) IVPB 1250 mg/250 mL  Status:  Discontinued        1,250 mg 166.7 mL/hr over 90 Minutes Intravenous Every 12 hours 08/18/24 1818 08/21/24 0547   08/18/24 1830  vancomycin  (VANCOREADY) IVPB 500 mg/100 mL        500 mg 100 mL/hr over 60 Minutes Intravenous  Once 08/18/24 1817 08/19/24 1642   08/16/24 1600  vancomycin  (VANCOREADY) IVPB 750 mg/150 mL  Status:  Discontinued        750 mg 150 mL/hr over 60 Minutes Intravenous Every 8 hours 08/16/24 0828 08/18/24 1818   08/14/24 0800  vancomycin  (VANCOCIN ) IVPB 1000 mg/200 mL premix  Status:  Discontinued        1,000 mg 200 mL/hr over 60 Minutes Intravenous Every 8 hours 08/14/24 0016 08/16/24 0828   08/14/24 0030  vancomycin  (VANCOCIN ) IVPB 1000 mg/200 mL premix        1,000 mg 200 mL/hr over 60 Minutes Intravenous NOW 08/14/24 0016 08/14/24 0135   08/13/24 0100  vancomycin  (VANCOREADY) IVPB 750 mg/150 mL  Status:  Discontinued        750 mg 150 mL/hr over 60 Minutes Intravenous Every 12 hours 08/12/24 1230 08/14/24 0007   08/12/24 1400  ceFAZolin  (ANCEF ) IVPB 2g/100 mL premix  Status:  Discontinued        2 g 200 mL/hr over 30 Minutes Intravenous Every 8 hours 08/12/24 1149 08/12/24 1230   08/12/24 1400  cefTAZidime  (FORTAZ ) 2 g in sodium chloride  0.9 % 100 mL IVPB  Status:  Discontinued        2 g 200 mL/hr over 30 Minutes Intravenous Every 8 hours 08/12/24 1152 08/13/24 1109   08/12/24 1245  ceFAZolin  (ANCEF ) IVPB 2g/100 mL premix  Status:  Discontinued        2 g 200 mL/hr over 30 Minutes Intravenous  Once 08/12/24 1149 08/12/24 1449   08/12/24 1245  vancomycin  (VANCOREADY) IVPB 1250 mg/250 mL        1,250 mg 166.7 mL/hr over 90 Minutes Intravenous  Once 08/12/24 1152 08/12/24  1501   08/12/24 0957  vancomycin  (VANCOCIN ) powder  Status:  Discontinued          As needed 08/12/24 1008 08/12/24 1037   08/01/24 2200  ceFAZolin  (ANCEF ) IVPB 2g/100 mL premix        2 g 200 mL/hr over 30 Minutes Intravenous Every 8 hours 08/01/24 1703 08/02/24 1915   08/01/24 1530  vancomycin  (VANCOCIN ) powder  Status:  Discontinued          As needed 08/01/24 1559 08/01/24 1606   08/01/24 1421  ceFAZolin  (ANCEF ) 2-4 GM/100ML-% IVPB       Note to Pharmacy: Roslynn Birmingham: cabinet override      08/01/24 1421 08/02/24 0229   07/29/24 2200  ceFAZolin  (ANCEF ) IVPB 2g/100 mL premix  2 g 200 mL/hr over 30 Minutes Intravenous Every 8 hours 07/29/24 1859 07/30/24 1423   07/29/24 1654  vancomycin  (VANCOCIN ) powder  Status:  Discontinued          As needed 07/29/24 1655 07/29/24 1758   07/29/24 1515  ceFAZolin  (ANCEF ) IVPB 2g/100 mL premix        2 g 200 mL/hr over 30 Minutes Intravenous  Once 07/29/24 1421 07/29/24 1655         Subjective: Patient seen and examined.  No complaints.  Sitter at the bedside.  Objective: Vitals:   09/13/24 1618 09/13/24 2200 09/14/24 0500 09/14/24 0555  BP: 117/70 (!) 144/62  108/84  Pulse: 64 72  (!) 51  Resp: 18 17    Temp: 98 F (36.7 C) 98.1 F (36.7 C)  97.8 F (36.6 C)  TempSrc: Oral   Oral  SpO2: 94% 99%  98%  Weight:   66.6 kg   Height:        Intake/Output Summary (Last 24 hours) at 09/14/2024 1247 Last data filed at 09/14/2024 0848 Gross per 24 hour  Intake 1080 ml  Output 1555 ml  Net -475 ml   Filed Weights   09/12/24 0500 09/13/24 0500 09/14/24 0500  Weight: 66 kg 66.9 kg 66.6 kg    Examination:  General exam: Appears calm and comfortable  Respiratory system: Clear to auscultation. Respiratory effort normal. Cardiovascular system: S1 & S2 heard, RRR. No JVD, murmurs, rubs, gallops or clicks. No pedal edema. Gastrointestinal system: Abdomen is nondistended, soft and nontender. No organomegaly or masses felt. Normal  bowel sounds heard. Central nervous system: Alert and oriented. No focal neurological deficits. Extremities: Symmetric 5 x 5 power. Skin: No rashes, lesions or ulcers  Data Reviewed: I have personally reviewed following labs and imaging studies  CBC: Recent Labs  Lab 09/08/24 0558 09/11/24 0918  WBC 2.8* 4.1  HGB 11.5* 12.6*  HCT 35.0* 39.1  MCV 89.3 91.1  PLT 195 202   Basic Metabolic Panel: Recent Labs  Lab 09/08/24 0558 09/11/24 0918 09/13/24 0524  NA 142 143 140  K 4.0 4.2 3.9  CL 110 109 108  CO2 22 25 27   GLUCOSE 111* 128* 94  BUN 13 18 17   CREATININE 0.79 1.07 0.89  CALCIUM 8.8* 9.6 9.1   GFR: Estimated Creatinine Clearance: 83.1 mL/min (by C-G formula based on SCr of 0.89 mg/dL). Liver Function Tests: No results for input(s): AST, ALT, ALKPHOS, BILITOT, PROT, ALBUMIN  in the last 168 hours. No results for input(s): LIPASE, AMYLASE in the last 168 hours. No results for input(s): AMMONIA in the last 168 hours. Coagulation Profile: No results for input(s): INR, PROTIME in the last 168 hours. Cardiac Enzymes: No results for input(s): CKTOTAL, CKMB, CKMBINDEX, TROPONINI in the last 168 hours. BNP (last 3 results) No results for input(s): PROBNP in the last 8760 hours. HbA1C: No results for input(s): HGBA1C in the last 72 hours. CBG: Recent Labs  Lab 09/12/24 0522 09/12/24 1125 09/12/24 1616 09/12/24 1935 09/13/24 0018  GLUCAP 102* 115* 115* 113* 111*   Lipid Profile: No results for input(s): CHOL, HDL, LDLCALC, TRIG, CHOLHDL, LDLDIRECT in the last 72 hours. Thyroid Function Tests: No results for input(s): TSH, T4TOTAL, FREET4, T3FREE, THYROIDAB in the last 72 hours. Anemia Panel: No results for input(s): VITAMINB12, FOLATE, FERRITIN, TIBC, IRON, RETICCTPCT in the last 72 hours. Sepsis Labs: No results for input(s): PROCALCITON, LATICACIDVEN in the last 168 hours.  No results  found for this or  any previous visit (from the past 240 hours).   Radiology Studies: No results found.   Scheduled Meds:  benztropine   1 mg Oral BID   Chlorhexidine  Gluconate Cloth  6 each Topical Daily   clonazePAM   0.5 mg Oral TID   docusate  100 mg Oral Daily   feeding supplement  237 mL Oral BID BM   ferrous sulfate   325 mg Oral Q breakfast   folic acid   1 mg Oral Daily   gabapentin   200 mg Oral TID   heparin  injection (subcutaneous)  5,000 Units Subcutaneous Q8H   mirtazapine   15 mg Oral QHS   multivitamin with minerals  1 tablet Oral Daily   nicotine   14 mg Transdermal Daily   pantoprazole   40 mg Oral QHS   polyethylene glycol  17 g Oral Daily   propranolol   20 mg Oral BID   senna  2 tablet Oral Daily   sodium chloride  flush  10-40 mL Intracatheter Q12H   thiamine   100 mg Oral Daily   valbenazine   40 mg Oral Daily   Continuous Infusions:  vancomycin  1,000 mg (09/14/24 0951)     LOS: 61 days   Fredia Skeeter, MD [facility]  09/14/2024, 12:47 PM   *Please note that this is a verbal dictation therefore any spelling or grammatical errors are due to the Dragon Medical One system interpretation.  Please page via Amion and do not message via secure chat for urgent patient care matters. Secure chat can be used for non urgent patient care matters.  How to contact the [facility] Attending or Consulting provider 7A - 7P or covering provider during after hours 7P -7A, for this patient?  Check the care team in [facility] and look for a) attending/consulting [facility] provider listed and b) the [facility] team listed. Page or secure chat 7A-7P. Log into [email or web address] and use [place]'s universal password to access. If you do not have the password, please contact the hospital operator. Locate the [facility] provider you are looking for under [facility] and page to a number that you can be directly reached. If you still have difficulty reaching the provider, please page the [facility] (Director on  Call) for the Hospitalists listed on amion for assistance.

## 2024-09-15 LAB — VANCOMYCIN, TROUGH: Vancomycin Tr: 17 ug/mL (ref 15–20)

## 2024-09-15 MED ORDER — LORAZEPAM 2 MG/ML IJ SOLN
0.5000 mg | INTRAMUSCULAR | Status: DC | PRN
  Administered 2024-09-15 – 2024-09-24 (×40): 0.5 mg via INTRAVENOUS
  Filled 2024-09-15 (×41): qty 1

## 2024-09-15 NOTE — Progress Notes (Signed)
 Overnight cross coverage  RN requesting medication for agitation.  Informed by RN that patient is very impulsive, constantly trying to get out of bed, and will not stop calling for meds to help him sleep and help with his jaw tremors.  I have adjusted dosing of Ativan  0.5 mg from every 6 hours PRN to every 4 hours PRN.

## 2024-09-15 NOTE — Progress Notes (Signed)
 PROGRESS NOTE    Ernest Romero  FMW:[id number] DOB: 01/28/1966 DOA: 07/15/2024 PCP: [name]   Brief Narrative:  58 year old homeless, medical history significant for alcohol use disorder, bipolar and panic disorder, initially presented to [facility] from jail in September 2025 with a large subdural hematoma.  Underwent craniotomy with flap replacement on 10/27.  Since then multiple OR visits including 10/30 for SDH evacuation, follow-up CT head 11/8 showed fluid collection underneath the bone flap, back to the OR 11/10 and then bone flap was removed, cultures were obtained intraoperative growing MRSA, treating for infected bone flap, ID following and currently on IV vancomycin .   10/27 crani w/ flap replacement 10/29: off clevi, liberate SBP goal, Klonopin , lithium , Haldol  as needed discontinued.  Latuda  dose cut by half 10/30: repeat CT head with increased collection - going to OR this afternoon for evacuation 10/31: OR  for SDH evacuation.  Developed arm twitching.  Klonopin  restarted at half dose 11/1: More awake.  No complaint 11/2 transferred out of ICU. 11/4 cortrak removed 11/8 CT head showed fluid collection under bone flap, decision made to remove bone flap 11/10 to OR for L bone flap removal 11/11 repeat CT head stable 11/29: notice to have worsening Dysphagia.  12/1 repeat CT head: stable.  12/3: Cortrack placed for nutrition, sutures removed by neurosurgery.  Tube feeds started. 12/4: Very sedated, psychiatry requested to readjust meds, reduced clonazepam  to0.5mg  BID, decreased valbenazine  to 40 mg daily 12/5: Noted oriented but jaw tremors worse.  Evaluated by neurology, recommended psych to continue to follow and adjust medications  Assessment & Plan:   Principal Problem:   Subdural hematoma (HCC) Active Problems:   Bipolar disorder (HCC)   Unable to make decisions about medical treatment due to impaired mental capacity   Alcohol withdrawal  (HCC)   Essential hypertension   Protein-calorie malnutrition, severe   Acute metabolic encephalopathy   Infection of craniotomy plate  Subdural hematoma /  Status post initial craniotomy 9/28 (Dr. 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 - Followed by neurosurgery. -Evaluated by infectious disease who recommends 6 weeks of IV vancomycin , EOT 09/23/2024.  After completion of IV antibiotics he will need 8 weeks of doxycycline . - Will need PICC line placed closer to discharge date. - Completed 1 week of Keppra  -  sutures were removed without complication by neurosurgery on 12/3.  EVD sutures remain intact, no signs of drainage or erythema, recommended do not remove at this time     -Acute metabolic encephalopathy - At baseline, patient reportedly without cognitive impairment, was homeless and independent caring for self. - At some point, during this hospitalization patient was deemed not to have capacity, due to encephalopathy and neurological deficits. Per recent physicians notes patient appears to be able to make decision for himself moving forward. Continue to monitor, capacity could fluctuates.  -  Patient was too oversedated before and psychiatry readjusted medications to reduce clonazepam  and valbenazine .  Subsequently became alert, agitated with worsening jaw tremor - appreciate neurology, Dr Lindzen for evaluating and recommendations, psych will need to continue following and make necessary adjustments to the regimen - Placed OT consult. Jaw tremors alleviates temporarily with distraction   -Was somewhat anxious and agitated on 12/7 AM, increased Klonopin  to 0.5 mg 3 times daily - Currently alert and oriented, cortrack removed on 12/8, patient tolerating diet without difficulty, appears close  to his baseline mental status.     Bipolar disorder/akathisia/drug-induced parkinsonism: -  Patient with long standing history of bipolar disorder. - Patient was on Haldol  and Latuda , developed facial tremors 11/20 concern for tardive dyskinesia. - EEG negative for seizures. - Latuda  and Haldol  discontinued. - Patient was started on propranolol  and Klonopin .  On Ingrezza  -Due to oversedation, Klonopin  was decreased to 0.5 mg twice daily (from 1 mg 3 times daily) and valbenazine  decreased to 40 mg daily on 12/4 -  appreciate neurology, Dr Lindzen for evaluating and recommendations, psych will need to continue following and make necessary adjustments to the regimen Currently alert and oriented, management per psych   Dysphagia 11/29: Overnight after medications given with water  patient felt something got stuck, he require suctioning.  -evaluated by speech. MBS: sign of aspiration.   CT head. Stable. - Alert and oriented, core track removed on 12/8, tolerating diet and meds.   Aspiration PNA;  Acute hypoxic Resp failure.  -In setting of dysphagia and aspiration   - Completed 5 days of IV Unasyn    history of alcohol, tobacco use disorder: - Continue thiamine , folic acid  and multivitamin -Treated for alcohol withdrawal delirium with phenobarbital  taper at [facility]. Resolved   Essential hypertension: - BP stable  Amlodipine  and losartan  has been discontinued. Propranolol  dose decreased to avoid hypotension   Constipation: Continue laxatives.    Bradycardia - Holding parameters on for propranolol     Severe malnutrition: Continue ensure.  -Started on tube feeds but now tolerating diet.   Iron deficiency anemia; started  iron supplement.    Oral Thrush; he was on nystatin  since last 13 days.  No oral thrush noted on 09/11/2024 and nystatin  discontinued.   Protein calorie malnutrition Nutrition Problem: Severe Malnutrition Etiology: acute illness Signs/Symptoms: severe muscle depletion, moderate muscle depletion Interventions: Ensure Enlive (each supplement provides 350kcal  and 20 grams of protein), MVI  DVT prophylaxis: heparin  injection 5,000 Units Start: 08/13/24 1400 SCDs Start: 08/12/24 1150 Place TED hose Start: 08/12/24 1150 SCDs Start: 08/01/24 1745 Place and maintain sequential compression device Start: 07/29/24 1819   Code Status: Full Code  Family Communication:  None present at bedside.  Plan of care discussed with patient in length and he/she verbalized understanding and agreed with it.  Status is: Inpatient Remains inpatient appropriate because: Patient medically stable, pending placement to SNF.   Estimated body mass index is 20.85 kg/m as calculated from the following:   Height as of this encounter: 5' 10 (1.778 m).   Weight as of this encounter: 65.9 kg.    Nutritional Assessment: Body mass index is 20.85 kg/m.SABRA Seen by dietician.  I agree with the assessment and plan as outlined below: Nutrition Status: Nutrition Problem: Severe Malnutrition Etiology: acute illness Signs/Symptoms: severe muscle depletion, moderate muscle depletion Interventions: Ensure Enlive (each supplement provides 350kcal and 20 grams of protein), MVI  . Skin Assessment: I have examined the patient's skin and I agree with the wound assessment as performed by the wound care RN as outlined below:    Consultants:  Neurology, psychiatry, neurosurgery  Procedures:  As above  Antimicrobials:  Anti-infectives (From admission, onward)    Start     Dose/Rate Route Frequency Ordered Stop   09/13/24 1000  vancomycin  (VANCOCIN ) IVPB 1000 mg/200 mL premix        1,000 mg 200 mL/hr over 60 Minutes Intravenous Every 12 hours 09/13/24 0835     09/02/24 1700  Ampicillin -Sulbactam (UNASYN ) 3 g in sodium chloride  0.9 %  100 mL IVPB        3 g 200 mL/hr over 30 Minutes Intravenous Every 6 hours 09/02/24 1611 09/07/24 1230   08/31/24 1000  vancomycin  (VANCOREADY) IVPB 750 mg/150 mL  Status:  Discontinued        750 mg 150 mL/hr over 60 Minutes Intravenous Every 12  hours 08/31/24 0015 09/13/24 0835   08/21/24 0800  vancomycin  (VANCOCIN ) IVPB 1000 mg/200 mL premix  Status:  Discontinued        1,000 mg 200 mL/hr over 60 Minutes Intravenous Every 12 hours 08/21/24 0720 08/31/24 0015   08/19/24 0600  vancomycin  (VANCOREADY) IVPB 1250 mg/250 mL  Status:  Discontinued        1,250 mg 166.7 mL/hr over 90 Minutes Intravenous Every 12 hours 08/18/24 1818 08/21/24 0547   08/18/24 1830  vancomycin  (VANCOREADY) IVPB 500 mg/100 mL        500 mg 100 mL/hr over 60 Minutes Intravenous  Once 08/18/24 1817 08/19/24 1642   08/16/24 1600  vancomycin  (VANCOREADY) IVPB 750 mg/150 mL  Status:  Discontinued        750 mg 150 mL/hr over 60 Minutes Intravenous Every 8 hours 08/16/24 0828 08/18/24 1818   08/14/24 0800  vancomycin  (VANCOCIN ) IVPB 1000 mg/200 mL premix  Status:  Discontinued        1,000 mg 200 mL/hr over 60 Minutes Intravenous Every 8 hours 08/14/24 0016 08/16/24 0828   08/14/24 0030  vancomycin  (VANCOCIN ) IVPB 1000 mg/200 mL premix        1,000 mg 200 mL/hr over 60 Minutes Intravenous NOW 08/14/24 0016 08/14/24 0135   08/13/24 0100  vancomycin  (VANCOREADY) IVPB 750 mg/150 mL  Status:  Discontinued        750 mg 150 mL/hr over 60 Minutes Intravenous Every 12 hours 08/12/24 1230 08/14/24 0007   08/12/24 1400  ceFAZolin  (ANCEF ) IVPB 2g/100 mL premix  Status:  Discontinued        2 g 200 mL/hr over 30 Minutes Intravenous Every 8 hours 08/12/24 1149 08/12/24 1230   08/12/24 1400  cefTAZidime  (FORTAZ ) 2 g in sodium chloride  0.9 % 100 mL IVPB  Status:  Discontinued        2 g 200 mL/hr over 30 Minutes Intravenous Every 8 hours 08/12/24 1152 08/13/24 1109   08/12/24 1245  ceFAZolin  (ANCEF ) IVPB 2g/100 mL premix  Status:  Discontinued        2 g 200 mL/hr over 30 Minutes Intravenous  Once 08/12/24 1149 08/12/24 1449   08/12/24 1245  vancomycin  (VANCOREADY) IVPB 1250 mg/250 mL        1,250 mg 166.7 mL/hr over 90 Minutes Intravenous  Once 08/12/24 1152 08/12/24  1501   08/12/24 0957  vancomycin  (VANCOCIN ) powder  Status:  Discontinued          As needed 08/12/24 1008 08/12/24 1037   08/01/24 2200  ceFAZolin  (ANCEF ) IVPB 2g/100 mL premix        2 g 200 mL/hr over 30 Minutes Intravenous Every 8 hours 08/01/24 1703 08/02/24 1915   08/01/24 1530  vancomycin  (VANCOCIN ) powder  Status:  Discontinued          As needed 08/01/24 1559 08/01/24 1606   08/01/24 1421  ceFAZolin  (ANCEF ) 2-4 GM/100ML-% IVPB       Note to Pharmacy: Roslynn [place]: cabinet override      08/01/24 1421 08/02/24 0229   07/29/24 2200  ceFAZolin  (ANCEF ) IVPB 2g/100 mL premix  2 g 200 mL/hr over 30 Minutes Intravenous Every 8 hours 07/29/24 1859 07/30/24 1423   07/29/24 1654  vancomycin  (VANCOCIN ) powder  Status:  Discontinued          As needed 07/29/24 1655 07/29/24 1758   07/29/24 1515  ceFAZolin  (ANCEF ) IVPB 2g/100 mL premix        2 g 200 mL/hr over 30 Minutes Intravenous  Once 07/29/24 1421 07/29/24 1655         Subjective: Seen and examined.  He has no complaints.  Calm and redirectable.  He was asking me when he would be discharged to SNF.  I informed him that our [facility] is working on placement.  Objective: Vitals:   09/14/24 1949 09/15/24 0441 09/15/24 0756 09/15/24 0926  BP: (!) 146/81  (!) 118/92   Pulse: 65  (!) 51 63  Resp: 20  17   Temp: 97.6 F (36.4 C)  97.9 F (36.6 C)   TempSrc:      SpO2: 96%  97%   Weight:  65.9 kg    Height:        Intake/Output Summary (Last 24 hours) at 09/15/2024 1055 Last data filed at 09/15/2024 0612 Gross per 24 hour  Intake 1556 ml  Output 800 ml  Net 756 ml   Filed Weights   09/13/24 0500 09/14/24 0500 09/15/24 0441  Weight: 66.9 kg 66.6 kg 65.9 kg    Examination:  General exam: Appears calm and comfortable  Respiratory system: Clear to auscultation. Respiratory effort normal. Cardiovascular system: S1 & S2 heard, RRR. No JVD, murmurs, rubs, gallops or clicks. No pedal edema. Gastrointestinal system: Abdomen  is nondistended, soft and nontender. No organomegaly or masses felt. Normal bowel sounds heard. Central nervous system: Alert and oriented. No focal neurological deficits. Extremities: Symmetric 5 x 5 power. Skin: No rashes, lesions or ulcers  Data Reviewed: I have personally reviewed following labs and imaging studies  CBC: Recent Labs  Lab 09/11/24 0918  WBC 4.1  HGB 12.6*  HCT 39.1  MCV 91.1  PLT 202   Basic Metabolic Panel: Recent Labs  Lab 09/11/24 0918 09/13/24 0524  NA 143 140  K 4.2 3.9  CL 109 108  CO2 25 27  GLUCOSE 128* 94  BUN 18 17  CREATININE 1.07 0.89  CALCIUM 9.6 9.1   GFR: Estimated Creatinine Clearance: 82.3 mL/min (by C-G formula based on SCr of 0.89 mg/dL). Liver Function Tests: No results for input(s): AST, ALT, ALKPHOS, BILITOT, PROT, ALBUMIN  in the last 168 hours. No results for input(s): LIPASE, AMYLASE in the last 168 hours. No results for input(s): AMMONIA in the last 168 hours. Coagulation Profile: No results for input(s): INR, PROTIME in the last 168 hours. Cardiac Enzymes: No results for input(s): CKTOTAL, CKMB, CKMBINDEX, TROPONINI in the last 168 hours. BNP (last 3 results) No results for input(s): PROBNP in the last 8760 hours. HbA1C: No results for input(s): HGBA1C in the last 72 hours. CBG: Recent Labs  Lab 09/12/24 1125 09/12/24 1616 09/12/24 1935 09/13/24 0018 09/14/24 1951  GLUCAP 115* 115* 113* 111* 111*   Lipid Profile: No results for input(s): CHOL, HDL, LDLCALC, TRIG, CHOLHDL, LDLDIRECT in the last 72 hours. Thyroid Function Tests: No results for input(s): TSH, T4TOTAL, FREET4, T3FREE, THYROIDAB in the last 72 hours. Anemia Panel: No results for input(s): VITAMINB12, FOLATE, FERRITIN, TIBC, IRON, RETICCTPCT in the last 72 hours. Sepsis Labs: No results for input(s): PROCALCITON, LATICACIDVEN in the last 168 hours.  No results found  for this  or any previous visit (from the past 240 hours).   Radiology Studies: No results found.   Scheduled Meds:  benztropine   1 mg Oral BID   Chlorhexidine  Gluconate Cloth  6 each Topical Daily   clonazePAM   0.5 mg Oral TID   docusate  100 mg Oral Daily   feeding supplement  237 mL Oral BID BM   ferrous sulfate   325 mg Oral Q breakfast   folic acid   1 mg Oral Daily   gabapentin   200 mg Oral TID   heparin  injection (subcutaneous)  5,000 Units Subcutaneous Q8H   mirtazapine   15 mg Oral QHS   multivitamin with minerals  1 tablet Oral Daily   nicotine   14 mg Transdermal Daily   pantoprazole   40 mg Oral QHS   polyethylene glycol  17 g Oral Daily   propranolol   20 mg Oral BID   senna  2 tablet Oral Daily   sodium chloride  flush  10-40 mL Intracatheter Q12H   thiamine   100 mg Oral Daily   valbenazine   40 mg Oral Daily   Continuous Infusions:  vancomycin  1,000 mg (09/15/24 1029)     LOS: 62 days   Fredia Skeeter, MD [facility]  09/15/2024, 10:55 AM   *Please note that this is a verbal dictation therefore any spelling or grammatical errors are due to the Dragon Medical One system interpretation.  Please page via Amion and do not message via secure chat for urgent patient care matters. Secure chat can be used for non urgent patient care matters.  How to contact the [facility] Attending or Consulting provider 7A - 7P or covering provider during after hours 7P -7A, for this patient?  Check the care team in [facility] and look for a) attending/consulting [facility] provider listed and b) the [facility] team listed. Page or secure chat 7A-7P. Log into [email or web address] and use [place]'s universal password to access. If you do not have the password, please contact the hospital operator. Locate the [facility] provider you are looking for under [facility] and page to a number that you can be directly reached. If you still have difficulty reaching the provider, please page the [facility] (Director on Call) for the  Hospitalists listed on amion for assistance.

## 2024-09-15 NOTE — Progress Notes (Signed)
 Pharmacy Antibiotic Note  NOBLE CICALESE is a 58 y.o. 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 Dosing for CNS penetration with target troughs 15-20 mcg/ml. Per ID, plan 6 weeks of Vancomycin  (thru 09/23/24).   Vancomycin  trough appropriately drawn at 09:31, 30 min prior to next due dose and 11.5h following previously hung dose. Trough was therapeutic at 17 on dose of 1000mg  Q12H. Renal function stable on last check 12/12.   Plan: Continue vancomycin  1000 mg IV q12h  Monitor renal function Repeat trough as indicated  Height: 5' 10 (177.8 cm) Weight: 65.9 kg (145 lb 4.5 oz) IBW/kg (Calculated) : 73  Temp (24hrs), Avg:97.8 F (36.6 C), Min:97.6 F (36.4 C), Max:97.9 F (36.6 C)  Recent Labs  Lab 09/11/24 0918 09/13/24 0524 09/15/24 0931  WBC 4.1  --   --   CREATININE 1.07 0.89  --   VANCOTROUGH  --  17 17    Estimated Creatinine Clearance: 82.3 mL/min (by C-G formula based on SCr of 0.89 mg/dL).    No Known Allergies  Antimicrobials this admission: Vancomycin  11/10 > Ceftazidime  11/10 >11/11   Dose adjustments this admission: 11/11 VT = 9 (true trough 7.6) on 750mg  q12 >> incr to 1g q8 11/14 VT = 23 >> decr 750mg  q8 11/16 VT = 11 >> adj to 1250mg  q12 11/19 VT = 24 >> dec to 1 gm IV q12h 11/21 VT=  19 mcg/ml - continue same  11/29 V P/T 29/20>>dec 750 IV q12h 12/5 VT = 15 - > continue same 12/12 VT = 17 (true trough 12.8) on 750 mg q12 > incr to 1000 mg q12  Microbiology results: 10/27 MRSA PCR: positive 11/10 bone flap (tissue) x 2: one moderate and one abundant MRSA, MIC to Vanc 1  Thank you for involving pharmacy in this patient's care.  Maurilio Patten, PharmD PGY1 Pharmacy Resident [facility] 09/15/2024 10:30 AM

## 2024-09-16 NOTE — Progress Notes (Signed)
 Nutrition Follow-up  DOCUMENTATION CODES:  Severe malnutrition in context of acute illness/injury  INTERVENTION:  Continue Dysphagia 3 diet with thin liquids. Continue Magic Cup TID. Each supplement provides 290 Kcals and 9 grams of protein. Continue Ensure Plus High Protein PO BID. Each supplement provides 350 Kcals and 20 grams of protein. Continue multivitamin, thiamine , iron, and folic acid  daily. Continue mirtazapine .  NUTRITION DIAGNOSIS:  Severe Malnutrition related to acute illness as evidenced by severe muscle depletion, moderate muscle depletion - remains applicable  GOAL:  Patient will meet greater than or equal to 90% of their needs - currently being met with consistently 100% PO intake, continue to monitor  MONITOR:  PO intake, Supplement acceptance, Diet advancement, Labs, Weight trends  REASON FOR ASSESSMENT:  Follow-up for: Malnutrition Screening Tool, NPO/Clear Liquid Diet    ASSESSMENT:  PMH significant for ETOH abuse, HTN, bipolar disorder, anxiety/depression, hepatitis, craniotomy 06/30/24 for SDH during incarceration, presented on 10/13 for management of craniotomy s/p craniotomy with flap replacement 10/27.  09/28 admitted to [facility] with SDH s/p post prontoparietal craniotomy with evacuation 10/13 transferred to [facility] 10/27 s/p L allograft cranioplasty with flap replacement 10/29 Cortrak tube placed; xray with tip in descending duodenum  10/31 OR for SDH evacuation; diet advanced to Dysphagia 3; eating 75% of meals 11/02 Tube feeds discontinued  11/03 Transferred to progressive  11/04 Cortrak removed 11/10 OR for L bone flap removal, transferred to ICU 11/11 Transferred back to progressive  11/12 Continues to eat 75-100% meals, SLP passed patient for regular solids but patient prefers bite-sized Dys3 diet 11/25 Continued >75% meal intake charted 11/28 Diet changed to NPO due to coughing with medications 11/29 Failed MBS, SLP recommends continued NPO 12/01  Continues to fail swallow, NPO continues, repeat CT head due to increased drowsiness - results benign 12/02 Failed SLP eval again 12/03 Failed repeat MBS; Cortrak placed 12/08 Cortrak removed. Repeat MBS - SLP suspected Cortrak affecting patient's swallow, patient insists on resuming PO anyway. Dys 3, thin liquids. Patient has great appetite.  Update: SLP swallow re-eval today = continue Dys 3, thin liquids. The patient has a sitter at bedside due to impulsivity and attempts to get out of bed. He wants to know when he will be discharged and attending has requested that case manager speak to him.  Scheduled Meds:  benztropine   1 mg Oral BID   Chlorhexidine  Gluconate Cloth  6 each Topical Daily   clonazePAM   0.5 mg Oral TID   docusate  100 mg Oral Daily   feeding supplement  237 mL Oral BID BM   ferrous sulfate   325 mg Oral Q breakfast   folic acid   1 mg Oral Daily   gabapentin   200 mg Oral TID   heparin  injection (subcutaneous)  5,000 Units Subcutaneous Q8H   mirtazapine   15 mg Oral QHS   multivitamin with minerals  1 tablet Oral Daily   nicotine   14 mg Transdermal Daily   pantoprazole   40 mg Oral QHS   polyethylene glycol  17 g Oral Daily   propranolol   20 mg Oral BID   senna  2 tablet Oral Daily   sodium chloride  flush  10-40 mL Intracatheter Q12H   thiamine   100 mg Oral Daily   valbenazine   40 mg Oral Daily   Continuous Infusions:  vancomycin  1,000 mg (09/16/24 1115)    Diet Order             DIET DYS 3 Room service appropriate? Yes with Assist; Fluid  consistency: Thin  Diet effective now                  Meal Intake: Consistently 100%   Labs:     Latest Ref Rng & Units 09/13/2024    5:24 AM 09/11/2024    9:18 AM 09/08/2024    5:58 AM  CMP  Glucose 70 - 99 mg/dL 94  871  888   BUN 6 - 20 mg/dL 17  18  13    Creatinine 0.61 - 1.24 mg/dL 9.10  8.92  9.20   Sodium 135 - 145 mmol/L 140  143  142   Potassium 3.5 - 5.1 mmol/L 3.9  4.2  4.0   Chloride 98 - 111  mmol/L 108  109  110   CO2 22 - 32 mmol/L 27  25  22    Calcium 8.9 - 10.3 mg/dL 9.1  9.6  8.8     I/O: -11 L since admit  NUTRITION - FOCUSED PHYSICAL EXAM:  Flowsheet Row Most Recent Value  Orbital Region Mild depletion  Upper Arm Region Moderate depletion  Thoracic and Lumbar Region Mild depletion  Buccal Region Moderate depletion  Temple Region Moderate depletion  Clavicle Bone Region Severe depletion  Clavicle and Acromion Bone Region Severe depletion  Scapular Bone Region Moderate depletion  Dorsal Hand Mild depletion  Patellar Region Severe depletion  Anterior Thigh Region Severe depletion  Posterior Calf Region Severe depletion  Edema (RD Assessment) None  Hair Reviewed  Eyes Reviewed  Mouth Reviewed  Skin Reviewed  Nails Reviewed    EDUCATION NEEDS:  Education needs have been addressed  Skin:  Skin Assessment: Reviewed RN Assessment Skin Integrity Issues:: Incisions Incisions: -  Last BM:  12/14 type 4  Height:  Ht Readings from Last 1 Encounters:  08/12/24 5' 10 (1.778 m)   Weight:   Stable weight this admission  Weight Change: 12 Kg (15%) loss in 3 months  Edema: none  Ideal Body Weight:  75.5 kg   BMI:  Body mass index is 20.91 kg/m.  Estimated Nutritional Needs:  Kcal:  2100-2400 Protein:  110-130 Fluid:  2100-2400    Leverne Ruth, MS, RDN, LDN [place]. [facility] See AMION for contact information Secure chat preferred

## 2024-09-16 NOTE — Progress Notes (Signed)
 Physical Therapy Treatment Patient Details Name: Ernest Romero MRN: [id number] DOB: 04/21/1966 Today's Date: 09/16/2024   History of Present Illness Pt is a 58 y.o. male presenting 10/14 from [facility] where he was admitted 9/28 for [facility] episode in the jail. 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 On CIWA protocol. Transferred to [facility] for further management of craniotomy and flap. 10/27 s/p cranioplasty with bone flap replacement. 10/28 Change in status thought to be related to Klonopin  administration 10/28, repeat CTH shows significant increase in size of mixed attenuation subdural fluid collection underlying the L tempoparietal craniotomy site, now measuring approximately 15 mm in thickness, with associated mild mass effect and slight rightward midline shift. OR on 10/30 for SDH evacuation. [facility] 11/7 with fluid collected under bone flap. 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 PMH alcohol use disorder, psychiatric disorder, HTN    PT Comments  Making good progress towards acute functional goals. Pt motivated and tolerated all activities well today. Focused on static and dynamic balance training as well as gait to improve safety and independence. Requires up to min assist intermittently when ambulating without assistive device today. Patient will continue to benefit from skilled physical therapy services to further improve independence with functional mobility.     If plan is discharge home, recommend the following: A lot of help with bathing/dressing/bathroom;Assistance with cooking/housework;Direct supervision/assist for medications management;Direct supervision/assist for financial management;Assist for transportation;Help with stairs or ramp for entrance;Supervision due to cognitive status;A little help with walking and/or transfers   Can travel by private vehicle     Yes  Equipment  Recommendations  Rolling walker (2 wheels) (vs rollator, pending progress)    Recommendations for Other Services       Precautions / Restrictions Precautions Precautions: Fall Recall of Precautions/Restrictions: Impaired Precaution/Restrictions Comments: SBP <160, helmet (pt to wear when OOB per RN) Restrictions Weight Bearing Restrictions Per Provider Order: No     Mobility  Bed Mobility Overal bed mobility: Needs Assistance Bed Mobility: Supine to Sit, Sit to Supine     Supine to sit: Supervision Sit to supine: Supervision   General bed mobility comments: Supervision for safety, requires extra time, a little effortful scooting towards EOB, rigid posture.    Transfers Overall transfer level: Needs assistance Equipment used: None Transfers: Sit to/from Stand Sit to Stand: Contact guard assist           General transfer comment: CGA for safety, stabilized without UE support but noted to use back of legs to brace against bed. Cues for awareness and weight shift.    Ambulation/Gait Ambulation/Gait assistance: Min assist Gait Distance (Feet): 310 Feet Assistive device: None Gait Pattern/deviations: Step-through pattern, Decreased stride length, Shuffle, Narrow base of support, Staggering left, Leaning posteriorly Gait velocity: decreased Gait velocity interpretation: <1.8 ft/sec, indicate of risk for recurrent falls   General Gait Details: Cues to widen BOS, especially with turns, forward weight shift due to posterior lean. Pt required up to min assist with dynamic challenges demonstrating intermittent LOB. Able to turn head Lt/Rt, nod, and step backwards today.   Stairs             Wheelchair Mobility     Tilt Bed    Modified Rankin (Stroke Patients Only)       Balance Overall balance assessment: Needs assistance Sitting-balance support: No upper extremity supported, Feet supported Sitting balance-Leahy Scale: Good  Standing balance support:  No upper extremity supported Standing balance-Leahy Scale: Fair                              Hotel Manager: Impaired Factors Affecting Communication: Reduced clarity of speech  Cognition Arousal: Alert Behavior During Therapy: Flat affect   PT - Cognitive impairments: Memory, Problem solving, Safety/Judgement, Sequencing                       PT - Cognition Comments: Jaw tremor present. aware of place, year, initially gave month as January until cued, and recalled it is currently December. Following commands: Impaired      Cueing Cueing Techniques: Verbal cues  Exercises Other Exercises Other Exercises: 16 lateral step and return, forward step and return x5 ea per LE with min assist for balance. Lateral steps x 10 Lt and Rt (Min assist towards Lt.) 1/2 squat free standing x5. Toe walking x10 feet.    General Comments General comments (skin integrity, edema, etc.): sitter present, donned helmet prior to OOB activity.      Pertinent Vitals/Pain Pain Assessment Pain Assessment: No/denies pain    Home Living                          Prior Function            PT Goals (current goals can now be found in the care plan section) Acute Rehab PT Goals Patient Stated Goal: none stated Progress towards PT goals: Progressing toward goals    Frequency    Min 2X/week      PT Plan      Co-evaluation              AM-PAC PT 6 Clicks Mobility   Outcome Measure  Help needed turning from your back to your side while in a flat bed without using bedrails?: A Little Help needed moving from lying on your back to sitting on the side of a flat bed without using bedrails?: A Little Help needed moving to and from a bed to a chair (including a wheelchair)?: A Little Help needed standing up from a chair using your arms (e.g., wheelchair or bedside chair)?: A Little Help needed to walk in hospital room?: A Little Help  needed climbing 3-5 steps with a railing? : A Lot 6 Click Score: 17    End of Session Equipment Utilized During Treatment: Gait belt;Other (comment) (helmet) Activity Tolerance: Patient tolerated treatment well Patient left: in bed;with call bell/phone within reach;with bed alarm set;with nursing/sitter in room Nurse Communication: Mobility status (requesting ensure) PT Visit Diagnosis: Other symptoms and signs involving the nervous system (R29.898);Muscle weakness (generalized) (M62.81);Unsteadiness on feet (R26.81);Other abnormalities of gait and mobility (R26.89)     Time: 1201-1221 PT Time Calculation (min) (ACUTE ONLY): 20 min  Charges:    $Gait Training: 8-22 mins PT General Charges $$ ACUTE PT VISIT: 1 Visit                     Leontine Roads, PT, DPT [facility]  Rehabilitation Services Physical Therapist Office: [phone] Website: [email or web address]    Leontine GORMAN Roads 09/16/2024, 12:55 PM

## 2024-09-16 NOTE — Progress Notes (Signed)
 Speech Language Pathology Treatment: Dysphagia  Patient Details Name: Ernest Romero MRN: [id number] DOB: August 17, 1966 Today's Date: 09/16/2024 Time: 1000-1020 SLP Time Calculation (min) (ACUTE ONLY): 20 min  Assessment / Plan / Recommendation Clinical Impression  Skilled therapy session focused on dysphagia goals. SLP facilitated session by re-educating patient on results of MBS and subsequent strategies to implement during PO intake. SLP observed patient with trials of D3 solids, puree and thin liquids. Patient with intermittent wet vocal quality and throat clearing throughout PO intake- which may be indicative of bolus misdirection. Vocal quality was cleared with use of cough/throat clear. Continue current diet with strict use of aspiration precautions and consistent utilization of strategies including L head turn, small bites/sips, slow rate, dry swallow and intermittent throat clears to clear aspirates. SLP then prompted completion of EMST at 25cm H2O x30 repetitions to aid in expiratory support for cough. Patient left in bed with sitter present.   HPI HPI: Ernest Romero is a 58 y.o. male who was admitted to [facility] 06/30/24 after an unresponsive episode in jail. Dx large SDH; underwent left frontoparietal craniotomy with evacuation of SDH and drain placement 9/28 (drain removed 9/29). Transferred to [facility] for management of crani/flap 10/14. Cranioplasty with bone flap replacement 10/27; worsening neuro with midline shift; underwent  left burr hole for evacuation 10/30. Bone flap removal on 11/10.  Bedside swallow eval 10/28 after change in mentaion and acute change in swallowing; NPO, then advanced to dysphagia 3/thin liquids 10/31.  New orders 11/18 due to coughing with POs - rec continued Dys3/thin liquids. Repeat orders 11/28 due to acute change again in swallow. MBS completed on 11/29 - severe oropharyngeal dysphagia with aspiration of all liquids and puree solids; rec NPO. Repeat MBS 12/4- no significant  change. Cortrak placed. [facility] 12/1 with evolving postoperative changes from left sided hemicraniectomy and bone flap removal without evidence of an acute intracranial abnormality or significant residual subdural collection. MBS 12/8 rec Dys3/thin with L headturn      SLP Plan  Continue with current plan of care      Recommendations  Diet recommendations: Dysphagia 3 (mechanical soft);Thin liquid Liquids provided via: Cup;Straw Medication Administration: Crushed with puree Supervision: Staff to assist with self feeding;Full supervision/cueing for compensatory strategies Compensations: Slow rate;Small sips/bites;Minimize environmental distractions;Multiple dry swallows after each bite/sip;Clear throat intermittently Postural Changes and/or Swallow Maneuvers: Seated upright 90 degrees;Upright 30-60 min after meal       Oral care QID Frequent or constant Supervision/Assistance Dysphagia, oropharyngeal phase (R13.12) Continue with current plan of care      Ernest Romero M.A., CCC-SLP 09/16/2024, 10:27 AM

## 2024-09-16 NOTE — Progress Notes (Signed)
 PROGRESS NOTE    Ernest Romero  FMW:[id number] DOB: 1966/09/04 DOA: 07/15/2024 PCP: [name]   Brief Narrative:  58 year old homeless, medical history significant for alcohol use disorder, bipolar and panic disorder, initially presented to [facility] jail in September 2025 with a large subdural hematoma.  Underwent craniotomy with flap replacement on 10/27.  Since then multiple OR visits including 10/30 for [facility] evacuation, follow-up CT head 11/8 showed fluid collection underneath the bone flap, back to the OR 11/10 and then bone flap was removed, cultures were obtained intraoperative growing MRSA, treating for infected bone flap, ID following and currently on IV vancomycin .   10/27 crani w/ flap replacement 10/29: off clevi, liberate SBP goal, Klonopin , lithium , Haldol  as needed discontinued.  Latuda  dose cut by half 10/30: repeat CT head with increased collection - going to OR this afternoon for evacuation 10/31: OR  for [facility] evacuation.  Developed arm twitching.  Klonopin  restarted at half dose 11/1: More awake.  No complaint 11/2 transferred out of ICU. 11/4 cortrak removed 11/8 CT head showed fluid collection under bone flap, decision made to remove bone flap 11/10 to OR for L bone flap removal 11/11 repeat CT head stable 11/29: notice to have worsening Dysphagia.  12/1 repeat CT head: stable.  12/3: Cortrack placed for nutrition, sutures removed by neurosurgery.  Tube feeds started. 12/4: Very sedated, psychiatry requested to readjust meds, reduced clonazepam  to0.5mg  BID, decreased valbenazine  to 40 mg daily 12/5: Noted oriented but jaw tremors worse.  Evaluated by neurology, recommended psych to continue to follow and adjust medications  Assessment & Plan:   Principal Problem:   Subdural hematoma (HCC) Active Problems:   Bipolar disorder (HCC)   Unable to make decisions about medical treatment due to impaired mental capacity   Alcohol withdrawal  (HCC)   Essential hypertension   Protein-calorie malnutrition, severe   Acute metabolic encephalopathy   Infection of craniotomy plate  Subdural hematoma /  Status post initial craniotomy 9/28 (Dr. 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 - Followed by neurosurgery. -Evaluated by infectious disease who recommends 6 weeks of IV vancomycin , EOT 09/23/2024.  After completion of IV antibiotics he will need 8 weeks of doxycycline . - Will need PICC line placed closer to discharge date. - Completed 1 week of Keppra  -  sutures were removed without complication by neurosurgery on 12/3.  EVD sutures remain intact, no signs of drainage or erythema, recommended do not remove at this time     -Acute metabolic encephalopathy - At baseline, patient reportedly without cognitive impairment, was homeless and independent caring for self. - At some point, during this hospitalization patient was deemed not to have capacity, due to encephalopathy and neurological deficits. Per recent physicians notes patient appears to be able to make decision for himself moving forward. Continue to monitor, capacity could fluctuates.  -  Patient was too oversedated before and psychiatry readjusted medications to reduce clonazepam  and valbenazine .  Subsequently became alert, agitated with worsening jaw tremor - appreciate neurology, Dr Lindzen for evaluating and recommendations, psych will need to continue following and make necessary adjustments to the regimen - Placed OT consult. Jaw tremors alleviates temporarily with distraction   -Was somewhat anxious and agitated on 12/7 AM, increased Klonopin  to 0.5 mg 3 times daily - Currently alert and oriented, cortrack removed on 12/8, patient tolerating diet without difficulty, appears close  to his baseline mental status.     Bipolar disorder/akathisia/drug-induced parkinsonism: -  Patient with long standing history of bipolar disorder. - Patient was on Haldol  and Latuda , developed facial tremors 11/20 concern for tardive dyskinesia. - EEG negative for seizures. - Latuda  and Haldol  discontinued. - Patient was started on propranolol  and Klonopin .  On Ingrezza  -Due to oversedation, Klonopin  was decreased to 0.5 mg twice daily (from 1 mg 3 times daily) and valbenazine  decreased to 40 mg daily on 12/4 -  appreciate neurology, Dr Lindzen for evaluating and recommendations, psych will need to continue following and make necessary adjustments to the regimen Currently alert and oriented, management per psych   Dysphagia 11/29: Overnight after medications given with water  patient felt something got stuck, he require suctioning.  -evaluated by speech. MBS: sign of aspiration.   CT head. Stable. - Alert and oriented, core track removed on 12/8, tolerating diet and meds.   Aspiration PNA;  Acute hypoxic Resp failure.  -In setting of dysphagia and aspiration   - Completed 5 days of IV Unasyn    history of alcohol, tobacco use disorder: - Continue thiamine , folic acid  and multivitamin -Treated for alcohol withdrawal delirium with phenobarbital  taper at [facility]. Resolved   Essential hypertension: - BP stable  Amlodipine  and losartan  has been discontinued. Propranolol  dose decreased to avoid hypotension   Constipation: Continue laxatives.    Bradycardia - Holding parameters on for propranolol     Severe malnutrition: Continue ensure.  -Started on tube feeds but now tolerating diet.   Iron deficiency anemia; started  iron supplement.    Oral Thrush; he was on nystatin  since last 13 days.  No oral thrush noted on 09/11/2024 and nystatin  discontinued.   Protein calorie malnutrition Nutrition Problem: Severe Malnutrition Etiology: acute illness Signs/Symptoms: severe muscle depletion, moderate muscle depletion Interventions: Ensure Enlive (each supplement provides 350kcal  and 20 grams of protein), MVI  DVT prophylaxis: heparin  injection 5,000 Units Start: 08/13/24 1400 SCDs Start: 08/12/24 1150 Place TED hose Start: 08/12/24 1150 SCDs Start: 08/01/24 1745 Place and maintain sequential compression device Start: 07/29/24 1819   Code Status: Full Code  Family Communication:  None present at bedside.  Plan of care discussed with patient in length and he/she verbalized understanding and agreed with it.  Status is: Inpatient Remains inpatient appropriate because: Patient medically stable, pending placement to SNF.   Estimated body mass index is 20.91 kg/m as calculated from the following:   Height as of this encounter: 5' 10 (1.778 m).   Weight as of this encounter: 66.1 kg.    Nutritional Assessment: Body mass index is 20.91 kg/m.SABRA Seen by dietician.  I agree with the assessment and plan as outlined below: Nutrition Status: Nutrition Problem: Severe Malnutrition Etiology: acute illness Signs/Symptoms: severe muscle depletion, moderate muscle depletion Interventions: Ensure Enlive (each supplement provides 350kcal and 20 grams of protein), MVI  . Skin Assessment: I have examined the patient's skin and I agree with the wound assessment as performed by the wound care RN as outlined below:    Consultants:  Neurology, psychiatry, neurosurgery  Procedures:  As above  Antimicrobials:  Anti-infectives (From admission, onward)    Start     Dose/Rate Route Frequency Ordered Stop   09/13/24 1000  vancomycin  (VANCOCIN ) IVPB 1000 mg/200 mL premix        1,000 mg 200 mL/hr over 60 Minutes Intravenous Every 12 hours 09/13/24 0835     09/02/24 1700  Ampicillin -Sulbactam (UNASYN ) 3 g in sodium chloride  0.9 %  100 mL IVPB        3 g 200 mL/hr over 30 Minutes Intravenous Every 6 hours 09/02/24 1611 09/07/24 1230   08/31/24 1000  vancomycin  (VANCOREADY) IVPB 750 mg/150 mL  Status:  Discontinued        750 mg 150 mL/hr over 60 Minutes Intravenous Every 12  hours 08/31/24 0015 09/13/24 0835   08/21/24 0800  vancomycin  (VANCOCIN ) IVPB 1000 mg/200 mL premix  Status:  Discontinued        1,000 mg 200 mL/hr over 60 Minutes Intravenous Every 12 hours 08/21/24 0720 08/31/24 0015   08/19/24 0600  vancomycin  (VANCOREADY) IVPB 1250 mg/250 mL  Status:  Discontinued        1,250 mg 166.7 mL/hr over 90 Minutes Intravenous Every 12 hours 08/18/24 1818 08/21/24 0547   08/18/24 1830  vancomycin  (VANCOREADY) IVPB 500 mg/100 mL        500 mg 100 mL/hr over 60 Minutes Intravenous  Once 08/18/24 1817 08/19/24 1642   08/16/24 1600  vancomycin  (VANCOREADY) IVPB 750 mg/150 mL  Status:  Discontinued        750 mg 150 mL/hr over 60 Minutes Intravenous Every 8 hours 08/16/24 0828 08/18/24 1818   08/14/24 0800  vancomycin  (VANCOCIN ) IVPB 1000 mg/200 mL premix  Status:  Discontinued        1,000 mg 200 mL/hr over 60 Minutes Intravenous Every 8 hours 08/14/24 0016 08/16/24 0828   08/14/24 0030  vancomycin  (VANCOCIN ) IVPB 1000 mg/200 mL premix        1,000 mg 200 mL/hr over 60 Minutes Intravenous NOW 08/14/24 0016 08/14/24 0135   08/13/24 0100  vancomycin  (VANCOREADY) IVPB 750 mg/150 mL  Status:  Discontinued        750 mg 150 mL/hr over 60 Minutes Intravenous Every 12 hours 08/12/24 1230 08/14/24 0007   08/12/24 1400  ceFAZolin  (ANCEF ) IVPB 2g/100 mL premix  Status:  Discontinued        2 g 200 mL/hr over 30 Minutes Intravenous Every 8 hours 08/12/24 1149 08/12/24 1230   08/12/24 1400  cefTAZidime  (FORTAZ ) 2 g in sodium chloride  0.9 % 100 mL IVPB  Status:  Discontinued        2 g 200 mL/hr over 30 Minutes Intravenous Every 8 hours 08/12/24 1152 08/13/24 1109   08/12/24 1245  ceFAZolin  (ANCEF ) IVPB 2g/100 mL premix  Status:  Discontinued        2 g 200 mL/hr over 30 Minutes Intravenous  Once 08/12/24 1149 08/12/24 1449   08/12/24 1245  vancomycin  (VANCOREADY) IVPB 1250 mg/250 mL        1,250 mg 166.7 mL/hr over 90 Minutes Intravenous  Once 08/12/24 1152 08/12/24  1501   08/12/24 0957  vancomycin  (VANCOCIN ) powder  Status:  Discontinued          As needed 08/12/24 1008 08/12/24 1037   08/01/24 2200  ceFAZolin  (ANCEF ) IVPB 2g/100 mL premix        2 g 200 mL/hr over 30 Minutes Intravenous Every 8 hours 08/01/24 1703 08/02/24 1915   08/01/24 1530  vancomycin  (VANCOCIN ) powder  Status:  Discontinued          As needed 08/01/24 1559 08/01/24 1606   08/01/24 1421  ceFAZolin  (ANCEF ) 2-4 GM/100ML-% IVPB       Note to Pharmacy: Roslynn Birmingham: cabinet override      08/01/24 1421 08/02/24 0229   07/29/24 2200  ceFAZolin  (ANCEF ) IVPB 2g/100 mL premix  2 g 200 mL/hr over 30 Minutes Intravenous Every 8 hours 07/29/24 1859 07/30/24 1423   07/29/24 1654  vancomycin  (VANCOCIN ) powder  Status:  Discontinued          As needed 07/29/24 1655 07/29/24 1758   07/29/24 1515  ceFAZolin  (ANCEF ) IVPB 2g/100 mL premix        2 g 200 mL/hr over 30 Minutes Intravenous  Once 07/29/24 1421 07/29/24 1655         Subjective: Seen and examined, sitter at the bedside.  Patient fully alert and oriented.  He raised concern about not being taken care of by the night nurse.  I have relayed his concern to the charge nurse.  He was also asking when he will be discharged and what are the barriers for discharge.  I have asked social worker to talk to him and answer his questions.  Objective: Vitals:   09/16/24 0045 09/16/24 0410 09/16/24 0500 09/16/24 0717  BP: (!) 122/91   (!) 107/90  Pulse: 64   (!) 57  Resp: 20   18  Temp: 97.8 F (36.6 C)   97.7 F (36.5 C)  TempSrc: Oral   Oral  SpO2: 96%   94%  Weight:  66.5 kg 66.1 kg   Height:        Intake/Output Summary (Last 24 hours) at 09/16/2024 1037 Last data filed at 09/16/2024 0836 Gross per 24 hour  Intake 832 ml  Output 1000 ml  Net -168 ml   Filed Weights   09/15/24 0441 09/16/24 0410 09/16/24 0500  Weight: 65.9 kg 66.5 kg 66.1 kg    Examination:  General exam: Appears calm and comfortable  Respiratory  system: Clear to auscultation. Respiratory effort normal. Cardiovascular system: S1 & S2 heard, RRR. No JVD, murmurs, rubs, gallops or clicks. No pedal edema. Gastrointestinal system: Abdomen is nondistended, soft and nontender. No organomegaly or masses felt. Normal bowel sounds heard. Central nervous system: Alert and oriented. No focal neurological deficits. Extremities: Symmetric 5 x 5 power. Skin: No rashes, lesions or ulcers  Data Reviewed: I have personally reviewed following labs and imaging studies  CBC: Recent Labs  Lab 09/11/24 0918  WBC 4.1  HGB 12.6*  HCT 39.1  MCV 91.1  PLT 202   Basic Metabolic Panel: Recent Labs  Lab 09/11/24 0918 09/13/24 0524  NA 143 140  K 4.2 3.9  CL 109 108  CO2 25 27  GLUCOSE 128* 94  BUN 18 17  CREATININE 1.07 0.89  CALCIUM 9.6 9.1   GFR: Estimated Creatinine Clearance: 82.5 mL/min (by C-G formula based on SCr of 0.89 mg/dL). Liver Function Tests: No results for input(s): AST, ALT, ALKPHOS, BILITOT, PROT, ALBUMIN  in the last 168 hours. No results for input(s): LIPASE, AMYLASE in the last 168 hours. No results for input(s): AMMONIA in the last 168 hours. Coagulation Profile: No results for input(s): INR, PROTIME in the last 168 hours. Cardiac Enzymes: No results for input(s): CKTOTAL, CKMB, CKMBINDEX, TROPONINI in the last 168 hours. BNP (last 3 results) No results for input(s): PROBNP in the last 8760 hours. HbA1C: No results for input(s): HGBA1C in the last 72 hours. CBG: Recent Labs  Lab 09/12/24 1125 09/12/24 1616 09/12/24 1935 09/13/24 0018 09/14/24 1951  GLUCAP 115* 115* 113* 111* 111*   Lipid Profile: No results for input(s): CHOL, HDL, LDLCALC, TRIG, CHOLHDL, LDLDIRECT in the last 72 hours. Thyroid Function Tests: No results for input(s): TSH, T4TOTAL, FREET4, T3FREE, THYROIDAB in the last 72 hours.  Anemia Panel: No results for input(s): VITAMINB12,  FOLATE, FERRITIN, TIBC, IRON, RETICCTPCT in the last 72 hours. Sepsis Labs: No results for input(s): PROCALCITON, LATICACIDVEN in the last 168 hours.  No results found for this or any previous visit (from the past 240 hours).   Radiology Studies: No results found.   Scheduled Meds:  benztropine   1 mg Oral BID   Chlorhexidine  Gluconate Cloth  6 each Topical Daily   clonazePAM   0.5 mg Oral TID   docusate  100 mg Oral Daily   feeding supplement  237 mL Oral BID BM   ferrous sulfate   325 mg Oral Q breakfast   folic acid   1 mg Oral Daily   gabapentin   200 mg Oral TID   heparin  injection (subcutaneous)  5,000 Units Subcutaneous Q8H   mirtazapine   15 mg Oral QHS   multivitamin with minerals  1 tablet Oral Daily   nicotine   14 mg Transdermal Daily   pantoprazole   40 mg Oral QHS   polyethylene glycol  17 g Oral Daily   propranolol   20 mg Oral BID   senna  2 tablet Oral Daily   sodium chloride  flush  10-40 mL Intracatheter Q12H   thiamine   100 mg Oral Daily   valbenazine   40 mg Oral Daily   Continuous Infusions:  vancomycin  1,000 mg (09/15/24 2218)     LOS: 63 days   Fredia Skeeter, MD [facility]  09/16/2024, 10:37 AM   *Please note that this is a verbal dictation therefore any spelling or grammatical errors are due to the Dragon Medical One system interpretation.  Please page via Amion and do not message via secure chat for urgent patient care matters. Secure chat can be used for non urgent patient care matters.  How to contact the [facility] Attending or Consulting provider 7A - 7P or covering provider during after hours 7P -7A, for this patient?  Check the care team in [facility] and look for a) attending/consulting [facility] provider listed and b) the [facility] team listed. Page or secure chat 7A-7P. Log into [email or web address] and use [facility] universal password to access. If you do not have the password, please contact the hospital operator. Locate the [facility] provider you are  looking for under [facility] and page to a number that you can be directly reached. If you still have difficulty reaching the provider, please page the [facility] (Director on Call) for the Hospitalists listed on amion for assistance.

## 2024-09-16 NOTE — Plan of Care (Signed)
  Problem: Nutrition: Goal: Adequate nutrition will be maintained Outcome: Progressing   Problem: Health Behavior/Discharge Planning: Goal: Ability to manage health-related needs will improve Outcome: Not Progressing   Problem: Clinical Measurements: Goal: Will remain free from infection Outcome: Not Progressing   

## 2024-09-17 NOTE — Progress Notes (Signed)
 PROGRESS NOTE    Ernest Romero  FMW:[id number] DOB: [birth year] DOA: 07/15/2024 PCP: [name]   Brief Narrative:  58 year old homeless, medical history significant for alcohol use disorder, bipolar and panic disorder, initially presented to [facility] from jail in September 2025 with a large subdural hematoma.  Underwent craniotomy with flap replacement on 10/27.  Since then multiple OR visits including 10/30 for SDH evacuation, follow-up CT head 11/8 showed fluid collection underneath the bone flap, back to the OR 11/10 and then bone flap was removed, cultures were obtained intraoperative growing MRSA, treating for infected bone flap, ID following and currently on IV vancomycin .   10/27 crani w/ flap replacement 10/29: off clevi, liberate SBP goal, Klonopin , lithium , Haldol  as needed discontinued.  Latuda  dose cut by half 10/30: repeat CT head with increased collection - going to OR this afternoon for evacuation 10/31: OR  for SDH evacuation.  Developed arm twitching.  Klonopin  restarted at half dose 11/1: More awake.  No complaint 11/2 transferred out of ICU. 11/4 cortrak removed 11/8 CT head showed fluid collection under bone flap, decision made to remove bone flap 11/10 to OR for L bone flap removal 11/11 repeat CT head stable 11/29: notice to have worsening Dysphagia.  12/1 repeat CT head: stable.  12/3: Cortrack placed for nutrition, sutures removed by neurosurgery.  Tube feeds started. 12/4: Very sedated, psychiatry requested to readjust meds, reduced clonazepam  to0.5mg  BID, decreased valbenazine  to 40 mg daily 12/5: Noted oriented but jaw tremors worse.  Evaluated by neurology, recommended psych to continue to follow and adjust medications  Assessment & Plan:   Principal Problem:   Subdural hematoma (HCC) Active Problems:   Bipolar disorder (HCC)   Unable to make decisions about medical treatment due to impaired mental capacity   Alcohol withdrawal  (HCC)   Essential hypertension   Protein-calorie malnutrition, severe   Acute metabolic encephalopathy   Infection of craniotomy plate  Subdural hematoma /  Status post initial craniotomy 9/28 (Dr. 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 - Followed by neurosurgery. -Evaluated by infectious disease who recommends 6 weeks of IV vancomycin , EOT 09/23/2024.  After completion of IV antibiotics he will need 8 weeks of doxycycline . - Will need PICC line placed closer to discharge date. - Completed 1 week of Keppra  -  sutures were removed without complication by neurosurgery on 12/3.  EVD sutures remain intact, no signs of drainage or erythema, recommended do not remove at this time     -Acute metabolic encephalopathy - At baseline, patient reportedly without cognitive impairment, was homeless and independent caring for self. - At some point, during this hospitalization patient was deemed not to have capacity, due to encephalopathy and neurological deficits. Per recent physicians notes patient appears to be able to make decision for himself moving forward. Continue to monitor, capacity could fluctuates.  -  Patient was too oversedated before and psychiatry readjusted medications to reduce clonazepam  and valbenazine .  Subsequently became alert, agitated with worsening jaw tremor - appreciate neurology, Dr Lindzen for evaluating and recommendations, psych will need to continue following and make necessary adjustments to the regimen - Placed OT consult. Jaw tremors alleviates temporarily with distraction   -Was somewhat anxious and agitated on 12/7 AM, increased Klonopin  to 0.5 mg 3 times daily - Currently alert and oriented, cortrack removed on 12/8, patient tolerating diet without difficulty, appears close  to his baseline mental status.     Bipolar disorder/akathisia/drug-induced parkinsonism: -  Patient with long standing history of bipolar disorder. - Patient was on Haldol  and Latuda , developed facial tremors 11/20 concern for tardive dyskinesia. - EEG negative for seizures. - Latuda  and Haldol  discontinued. - Patient was started on propranolol  and Klonopin .  On Ingrezza  -Due to oversedation, Klonopin  was decreased to 0.5 mg twice daily (from 1 mg 3 times daily) and valbenazine  decreased to 40 mg daily on 12/4 -  appreciate neurology, Dr Lindzen for evaluating and recommendations, psych will need to continue following and make necessary adjustments to the regimen Currently alert and oriented, management per psych   Dysphagia 11/29: Overnight after medications given with water  patient felt something got stuck, he require suctioning.  -evaluated by speech. MBS: sign of aspiration.   CT head. Stable. - Alert and oriented, core track removed on 12/8, tolerating diet and meds.   Aspiration PNA;  Acute hypoxic Resp failure.  -In setting of dysphagia and aspiration   - Completed 5 days of IV Unasyn    history of alcohol, tobacco use disorder: - Continue thiamine , folic acid  and multivitamin -Treated for alcohol withdrawal delirium with phenobarbital  taper at [facility]. Resolved   Essential hypertension: - BP stable  Amlodipine  and losartan  has been discontinued. Propranolol  dose decreased to avoid hypotension   Constipation: Continue laxatives.    Bradycardia - Holding parameters on for propranolol     Severe malnutrition: Continue ensure.  -Started on tube feeds but now tolerating diet.   Iron deficiency anemia; started  iron supplement.    Oral Thrush; he was on nystatin  since last 13 days.  No oral thrush noted on 09/11/2024 and nystatin  discontinued.   Protein calorie malnutrition Nutrition Problem: Severe Malnutrition Etiology: acute illness Signs/Symptoms: severe muscle depletion, moderate muscle depletion Interventions: Ensure Enlive (each supplement provides 350kcal  and 20 grams of protein), MVI  DVT prophylaxis: heparin  injection 5,000 Units Start: 08/13/24 1400 SCDs Start: 08/12/24 1150 Place TED hose Start: 08/12/24 1150 SCDs Start: 08/01/24 1745 Place and maintain sequential compression device Start: 07/29/24 1819   Code Status: Full Code  Family Communication:  None present at bedside.  Plan of care discussed with patient in length and he/she verbalized understanding and agreed with it.  Status is: Inpatient Remains inpatient appropriate because: Patient medically stable, pending placement to SNF.   Estimated body mass index is 21.35 kg/m as calculated from the following:   Height as of this encounter: 5' 10 (1.778 m).   Weight as of this encounter: 67.5 kg.    Nutritional Assessment: Body mass index is 21.35 kg/m.SABRA Seen by dietician.  I agree with the assessment and plan as outlined below: Nutrition Status: Nutrition Problem: Severe Malnutrition Etiology: acute illness Signs/Symptoms: severe muscle depletion, moderate muscle depletion Interventions: Ensure Enlive (each supplement provides 350kcal and 20 grams of protein), MVI  . Skin Assessment: I have examined the patient's skin and I agree with the wound assessment as performed by the wound care RN as outlined below:    Consultants:  Neurology, psychiatry, neurosurgery  Procedures:  As above  Antimicrobials:  Anti-infectives (From admission, onward)    Start     Dose/Rate Route Frequency Ordered Stop   09/13/24 1000  vancomycin  (VANCOCIN ) IVPB 1000 mg/200 mL premix        1,000 mg 200 mL/hr over 60 Minutes Intravenous Every 12 hours 09/13/24 0835     09/02/24 1700  Ampicillin -Sulbactam (UNASYN ) 3 g in sodium chloride  0.9 %  100 mL IVPB        3 g 200 mL/hr over 30 Minutes Intravenous Every 6 hours 09/02/24 1611 09/07/24 1230   08/31/24 1000  vancomycin  (VANCOREADY) IVPB 750 mg/150 mL  Status:  Discontinued        750 mg 150 mL/hr over 60 Minutes Intravenous Every 12  hours 08/31/24 0015 09/13/24 0835   08/21/24 0800  vancomycin  (VANCOCIN ) IVPB 1000 mg/200 mL premix  Status:  Discontinued        1,000 mg 200 mL/hr over 60 Minutes Intravenous Every 12 hours 08/21/24 0720 08/31/24 0015   08/19/24 0600  vancomycin  (VANCOREADY) IVPB 1250 mg/250 mL  Status:  Discontinued        1,250 mg 166.7 mL/hr over 90 Minutes Intravenous Every 12 hours 08/18/24 1818 08/21/24 0547   08/18/24 1830  vancomycin  (VANCOREADY) IVPB 500 mg/100 mL        500 mg 100 mL/hr over 60 Minutes Intravenous  Once 08/18/24 1817 08/19/24 1642   08/16/24 1600  vancomycin  (VANCOREADY) IVPB 750 mg/150 mL  Status:  Discontinued        750 mg 150 mL/hr over 60 Minutes Intravenous Every 8 hours 08/16/24 0828 08/18/24 1818   08/14/24 0800  vancomycin  (VANCOCIN ) IVPB 1000 mg/200 mL premix  Status:  Discontinued        1,000 mg 200 mL/hr over 60 Minutes Intravenous Every 8 hours 08/14/24 0016 08/16/24 0828   08/14/24 0030  vancomycin  (VANCOCIN ) IVPB 1000 mg/200 mL premix        1,000 mg 200 mL/hr over 60 Minutes Intravenous NOW 08/14/24 0016 08/14/24 0135   08/13/24 0100  vancomycin  (VANCOREADY) IVPB 750 mg/150 mL  Status:  Discontinued        750 mg 150 mL/hr over 60 Minutes Intravenous Every 12 hours 08/12/24 1230 08/14/24 0007   08/12/24 1400  ceFAZolin  (ANCEF ) IVPB 2g/100 mL premix  Status:  Discontinued        2 g 200 mL/hr over 30 Minutes Intravenous Every 8 hours 08/12/24 1149 08/12/24 1230   08/12/24 1400  cefTAZidime  (FORTAZ ) 2 g in sodium chloride  0.9 % 100 mL IVPB  Status:  Discontinued        2 g 200 mL/hr over 30 Minutes Intravenous Every 8 hours 08/12/24 1152 08/13/24 1109   08/12/24 1245  ceFAZolin  (ANCEF ) IVPB 2g/100 mL premix  Status:  Discontinued        2 g 200 mL/hr over 30 Minutes Intravenous  Once 08/12/24 1149 08/12/24 1449   08/12/24 1245  vancomycin  (VANCOREADY) IVPB 1250 mg/250 mL        1,250 mg 166.7 mL/hr over 90 Minutes Intravenous  Once 08/12/24 1152 08/12/24  1501   08/12/24 0957  vancomycin  (VANCOCIN ) powder  Status:  Discontinued          As needed 08/12/24 1008 08/12/24 1037   08/01/24 2200  ceFAZolin  (ANCEF ) IVPB 2g/100 mL premix        2 g 200 mL/hr over 30 Minutes Intravenous Every 8 hours 08/01/24 1703 08/02/24 1915   08/01/24 1530  vancomycin  (VANCOCIN ) powder  Status:  Discontinued          As needed 08/01/24 1559 08/01/24 1606   08/01/24 1421  ceFAZolin  (ANCEF ) 2-4 GM/100ML-% IVPB       Note to Pharmacy: Roslynn Birmingham: cabinet override      08/01/24 1421 08/02/24 0229   07/29/24 2200  ceFAZolin  (ANCEF ) IVPB 2g/100 mL premix  2 g 200 mL/hr over 30 Minutes Intravenous Every 8 hours 07/29/24 1859 07/30/24 1423   07/29/24 1654  vancomycin  (VANCOCIN ) powder  Status:  Discontinued          As needed 07/29/24 1655 07/29/24 1758   07/29/24 1515  ceFAZolin  (ANCEF ) IVPB 2g/100 mL premix        2 g 200 mL/hr over 30 Minutes Intravenous  Once 07/29/24 1421 07/29/24 1655         Subjective: Patient seen and examined.  No complaints.  Sitter at the bedside.  Objective: Vitals:   09/17/24 0402 09/17/24 0426 09/17/24 0500 09/17/24 0849  BP:  135/72  104/75  Pulse:  79  64  Resp:    18  Temp:  97.7 F (36.5 C)  98 F (36.7 C)  TempSrc:    Oral  SpO2:  95%  95%  Weight: 67.5 kg  67.5 kg   Height:        Intake/Output Summary (Last 24 hours) at 09/17/2024 1111 Last data filed at 09/17/2024 0849 Gross per 24 hour  Intake 1840 ml  Output 1100 ml  Net 740 ml   Filed Weights   09/16/24 0500 09/17/24 0402 09/17/24 0500  Weight: 66.1 kg 67.5 kg 67.5 kg    Examination:  General exam: Appears calm and comfortable  Respiratory system: Clear to auscultation. Respiratory effort normal. Cardiovascular system: S1 & S2 heard, RRR. No JVD, murmurs, rubs, gallops or clicks. No pedal edema. Gastrointestinal system: Abdomen is nondistended, soft and nontender. No organomegaly or masses felt. Normal bowel sounds heard. Central nervous  system: Alert and oriented. No focal neurological deficits. Extremities: Symmetric 5 x 5 power. Skin: No rashes, lesions or ulcers  Data Reviewed: I have personally reviewed following labs and imaging studies  CBC: Recent Labs  Lab 09/11/24 0918  WBC 4.1  HGB 12.6*  HCT 39.1  MCV 91.1  PLT 202   Basic Metabolic Panel: Recent Labs  Lab 09/11/24 0918 09/13/24 0524  NA 143 140  K 4.2 3.9  CL 109 108  CO2 25 27  GLUCOSE 128* 94  BUN 18 17  CREATININE 1.07 0.89  CALCIUM 9.6 9.1   GFR: Estimated Creatinine Clearance: 84.3 mL/min (by C-G formula based on SCr of 0.89 mg/dL). Liver Function Tests: No results for input(s): AST, ALT, ALKPHOS, BILITOT, PROT, ALBUMIN  in the last 168 hours. No results for input(s): LIPASE, AMYLASE in the last 168 hours. No results for input(s): AMMONIA in the last 168 hours. Coagulation Profile: No results for input(s): INR, PROTIME in the last 168 hours. Cardiac Enzymes: No results for input(s): CKTOTAL, CKMB, CKMBINDEX, TROPONINI in the last 168 hours. BNP (last 3 results) No results for input(s): PROBNP in the last 8760 hours. HbA1C: No results for input(s): HGBA1C in the last 72 hours. CBG: Recent Labs  Lab 09/12/24 1125 09/12/24 1616 09/12/24 1935 09/13/24 0018 09/14/24 1951  GLUCAP 115* 115* 113* 111* 111*   Lipid Profile: No results for input(s): CHOL, HDL, LDLCALC, TRIG, CHOLHDL, LDLDIRECT in the last 72 hours. Thyroid Function Tests: No results for input(s): TSH, T4TOTAL, FREET4, T3FREE, THYROIDAB in the last 72 hours. Anemia Panel: No results for input(s): VITAMINB12, FOLATE, FERRITIN, TIBC, IRON, RETICCTPCT in the last 72 hours. Sepsis Labs: No results for input(s): PROCALCITON, LATICACIDVEN in the last 168 hours.  No results found for this or any previous visit (from the past 240 hours).   Radiology Studies: No results found.   Scheduled Meds:   benztropine   1 mg Oral BID   Chlorhexidine  Gluconate Cloth  6 each Topical Daily   clonazePAM   0.5 mg Oral TID   docusate  100 mg Oral Daily   feeding supplement  237 mL Oral BID BM   ferrous sulfate   325 mg Oral Q breakfast   folic acid   1 mg Oral Daily   gabapentin   200 mg Oral TID   heparin  injection (subcutaneous)  5,000 Units Subcutaneous Q8H   mirtazapine   15 mg Oral QHS   multivitamin with minerals  1 tablet Oral Daily   nicotine   14 mg Transdermal Daily   pantoprazole   40 mg Oral QHS   polyethylene glycol  17 g Oral Daily   propranolol   20 mg Oral BID   senna  2 tablet Oral Daily   sodium chloride  flush  10-40 mL Intracatheter Q12H   thiamine   100 mg Oral Daily   valbenazine   40 mg Oral Daily   Continuous Infusions:  vancomycin  1,000 mg (09/16/24 2301)     LOS: 64 days   Fredia Skeeter, MD [facility]  09/17/2024, 11:11 AM   *Please note that this is a verbal dictation therefore any spelling or grammatical errors are due to the Dragon Medical One system interpretation.  Please page via Amion and do not message via secure chat for urgent patient care matters. Secure chat can be used for non urgent patient care matters.  How to contact the [facility] Attending or Consulting provider 7A - 7P or covering provider during after hours 7P -7A, for this patient?  Check the care team in [facility] [place] and look for a) attending/consulting [facility] provider listed and b) the [facility] team listed. Page or secure chat 7A-7P. Log into [email or web address] and use 's universal password to access. If you do not have the password, please contact the hospital operator. Locate the [facility] provider you are looking for under [facility] and page to a number that you can be directly reached. If you still have difficulty reaching the provider, please page the [facility] (Director on Call) for the Hospitalists listed on amion for assistance.

## 2024-09-17 NOTE — Progress Notes (Signed)
 Occupational Therapy Treatment Patient Details Name: Ernest Romero MRN: [id number] DOB: 1966/08/17 Today's Date: 09/17/2024   History of present illness Pt is a 58 y.o. male presenting 10/14 from [facility] where he was admitted 9/28 for unresponsive episode in the jail. 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 On CIWA protocol. Transferred to [facility] for further management of craniotomy and flap. 10/27 s/p cranioplasty with bone flap replacement. 10/28 Change in status thought to be related to Klonopin  administration 10/28, repeat CTH shows significant increase in size of mixed attenuation subdural fluid collection underlying the L tempoparietal craniotomy site, now measuring approximately 15 mm in thickness, with associated mild mass effect and slight rightward midline shift. OR on 10/30 for SDH evacuation. [facility] 11/7 with fluid collected under bone flap. 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 PMH alcohol use disorder, psychiatric disorder, HTN   OT comments  Patient making good gains with OT treatment with supervision to perform grooming tasks standing at sink, following 3 step commands, without cues for sequencing.  Patient able to perform mobility and functional transfers in room without AD and CGA for safety.  Patient will benefit from continued inpatient follow up therapy, <3 hours/day. Acute OT to continue to follow to address established goals to facilitate DC to next venue of care.        If plan is discharge home, recommend the following:  Supervision due to cognitive status;Direct supervision/assist for medications management;Assistance with cooking/housework;Direct supervision/assist for financial management;Assist for transportation;Assistance with feeding;A little help with walking and/or transfers;Help with stairs or ramp for entrance;A little help with bathing/dressing/bathroom   Equipment  Recommendations  BSC/3in1    Recommendations for Other Services      Precautions / Restrictions Precautions Precautions: Fall Recall of Precautions/Restrictions: Impaired Precaution/Restrictions Comments: SBP <160, helmet (pt to wear when OOB per RN) Restrictions Weight Bearing Restrictions Per Provider Order: No       Mobility Bed Mobility Overal bed mobility: Needs Assistance Bed Mobility: Supine to Sit     Supine to sit: Supervision     General bed mobility comments: verbal cues to initiate    Transfers Overall transfer level: Needs assistance Equipment used: None Transfers: Sit to/from Stand Sit to Stand: Contact guard assist           General transfer comment: CGA for safety     Balance Overall balance assessment: Needs assistance Sitting-balance support: No upper extremity supported, Feet supported Sitting balance-Leahy Scale: Good     Standing balance support: No upper extremity supported Standing balance-Leahy Scale: Fair Standing balance comment: supervision standing at sink for grooming tasks and CGA for dynamic standing                           ADL either performed or assessed with clinical judgement   ADL Overall ADL's : Needs assistance/impaired Eating/Feeding: Set up;Sitting   Grooming: Wash/dry hands;Wash/dry face;Oral care;Supervision/safety;Standing Grooming Details (indicate cue type and reason): at sink                 Toilet Transfer: Contact guard assist;Ambulation   Toileting- Clothing Manipulation and Hygiene: Supervision/safety;Sit to/from stand         General ADL Comments: supervision for safety wtih toilet hygiene and standing at sink for grooming tasks    Extremity/Trunk Assessment  Vision       Perception     Praxis     Communication Communication Communication: Impaired Factors Affecting Communication: Reduced clarity of speech   Cognition Arousal: Alert Behavior During  Therapy: Flat affect Cognition: Cognition impaired     Awareness: Online awareness impaired Memory impairment (select all impairments): Working civil service fast streamer, Short-term memory Attention impairment (select first level of impairment): Divided attention Executive functioning impairment (select all impairments): Organization, Sequencing, Reasoning, Problem solving OT - Cognition Comments: able to answer orientation questions correctly                 Following commands: Impaired Following commands impaired: Follows one step commands with increased time, Follows multi-step commands inconsistently, Follows multi-step commands with increased time      Cueing   Cueing Techniques: Verbal cues  Exercises      Shoulder Instructions       General Comments patient able to donn helmet without assistance    Pertinent Vitals/ Pain       Pain Assessment Pain Assessment: No/denies pain  Home Living                                          Prior Functioning/Environment              Frequency  Min 2X/week        Progress Toward Goals  OT Goals(current goals can now be found in the care plan section)  Progress towards OT goals: Progressing toward goals     Plan      Co-evaluation                 AM-PAC OT 6 Clicks Daily Activity     Outcome Measure   Help from another person eating meals?: A Little Help from another person taking care of personal grooming?: A Little Help from another person toileting, which includes using toliet, bedpan, or urinal?: A Little Help from another person bathing (including washing, rinsing, drying)?: A Little Help from another person to put on and taking off regular upper body clothing?: A Little Help from another person to put on and taking off regular lower body clothing?: A Little 6 Click Score: 18    End of Session Equipment Utilized During Treatment: Gait belt;Other (comment) (helmet)  OT Visit Diagnosis:  Other abnormalities of gait and mobility (R26.89);Muscle weakness (generalized) (M62.81);Other symptoms and signs involving the nervous system (R29.898);Cognitive communication deficit (R41.841);Other symptoms and signs involving cognitive function   Activity Tolerance Patient tolerated treatment well   Patient Left in chair;with call bell/phone within reach;with chair alarm set;with nursing/sitter in room   Nurse Communication Mobility status;Precautions        Time: 9252-9195 OT Time Calculation (min): 17 min  Charges: OT General Charges $OT Visit: 1 Visit OT Treatments $Self Care/Home Management : 8-22 mins  Dick Laine, OTA Acute Rehabilitation Services  Office [phone]   Jeb LITTIE Laine 09/17/2024, 10:38 AM

## 2024-09-18 LAB — BASIC METABOLIC PANEL WITH GFR
Anion gap: 9 (ref 5–15)
BUN: 26 mg/dL — ABNORMAL HIGH (ref 6–20)
CO2: 26 mmol/L (ref 22–32)
Calcium: 9.5 mg/dL (ref 8.9–10.3)
Chloride: 107 mmol/L (ref 98–111)
Creatinine, Ser: 0.97 mg/dL (ref 0.61–1.24)
GFR, Estimated: >60 mL/min (ref >60)
Glucose, Bld: 101 mg/dL — ABNORMAL HIGH (ref 70–99)
Potassium: 4.2 mmol/L (ref 3.5–5.1)
Sodium: 142 mmol/L (ref 135–145)

## 2024-09-18 NOTE — Plan of Care (Addendum)
 Shift Event(s):   @04 :00 AM: Shortly after the sitter requested break, this RN sat in the room to monitor patient. Patient woke up and started yelling he will go to the kitchen. Began climbing out of the bed and swang his arms at the RN to not touch him, despite RN was helping due to unsteady gait. Ativan  was given at this time. Patient became calm after offering a cup of Jello.   Problem: Nutrition: Goal: Adequate nutrition will be maintained Outcome: Progressing   Problem: Education: Goal: Knowledge of General Education information will improve Description: Including pain rating scale, medication(s)/side effects and non-pharmacologic comfort measures Outcome: Not Progressing   Problem: Health Behavior/Discharge Planning: Goal: Ability to manage health-related needs will improve Outcome: Not Progressing   Problem: Clinical Measurements: Goal: Will remain free from infection Outcome: Not Progressing   Problem: Activity: Goal: Risk for activity intolerance will decrease Outcome: Not Progressing

## 2024-09-18 NOTE — Progress Notes (Signed)
 Progress Note   Patient: Ernest Romero FMW:[id number] DOB: 1966/03/02 DOA: 07/15/2024     63 DOS: the patient was seen and examined on 09/18/2024   Brief hospital course:   Assessment and Plan: Subdural hematoma /  Status post initial craniotomy 9/28 (Dr. 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 Evaluated by infectious disease who recommends 6 weeks of IV vancomycin , EOT 09/23/2024.  After completion of IV antibiotics he will need 8 weeks of doxycycline . Will need PICC line placed closer to discharge date. Completed 1 week of Keppra  Followed by neurosurgery. Sutures were removed without complication by neurosurgery on 12/3.  EVD sutures remain intact, no signs of drainage or erythema, recommended do not remove at this time    Acute metabolic encephalopathy At baseline, patient reportedly without cognitive impairment, was homeless and independent caring for self. At some point, during this hospitalization patient was deemed not to have capacity, due to encephalopathy and neurological deficits. Per recent physicians notes patient appears to be able to make decision for himself moving forward. Continue to monitor, capacity could fluctuates.  Psychiatry readjusted medications to reduce clonazepam  and valbenazine .  Subsequently became alert, agitated with worsening jaw tremor Appreciate neurology, Dr Lindzen for evaluating and recommendations, psych will need to continue following and make necessary adjustments to the regimen.  Continue Klonopin  to 0.5 mg 3 times daily. Currently alert and oriented, cortrack removed on 12/8, patient tolerating diet.  Patient is requiring one-to-one sitter for safety.  Placement is challenging.  Bipolar disorder/akathisia/drug-induced parkinsonism: Patient was on Haldol  and Latuda , developed facial tremors 11/20 concern for tardive dyskinesia. EEG negative for  seizures. Latuda  and Haldol  discontinued. Patient was started on propranolol  and Klonopin .  On Ingrezza  Currently on sleepy with sitter at bedside, management per psych.     Dysphagia 11/29: Overnight after medications given with water  patient felt something got stuck, he require suctioning.  Evaluated by speech. MBS: sign of aspiration.  Alert and oriented, core track removed on 12/8, tolerating diet and meds.   Aspiration PNA;  Acute hypoxic Resp failure.  In setting of dysphagia and aspiration   Completed 5 days of IV Unasyn    History of alcohol, tobacco use disorder: Continue thiamine , folic acid  and multivitamin -Treated for alcohol withdrawal delirium with phenobarbital  taper at [facility]. Resolved   Essential hypertension: BP stable  Amlodipine  and losartan  has been discontinued. Propranolol  dose decreased to avoid hypotension   Constipation: Continue laxatives.    Bradycardia Holding parameters on for propranolol     Severe malnutrition: Continue ensure.  Started on tube feeds but now tolerating diet.   Iron deficiency anemia; started  iron supplement.       Out of bed to chair. Incentive spirometry. Nursing supportive care. Fall, aspiration precautions. Diet:  Diet Orders (From admission, onward)     Start     Ordered   09/09/24 1508  DIET DYS 3 Room service appropriate? Yes with Assist; Fluid consistency: Thin  Diet effective now       Comments: Crush meds with puree; full supervision  Question Answer Comment  Room service appropriate? Yes with Assist   Fluid consistency: Thin      09/09/24 1507           DVT prophylaxis: heparin  injection 5,000 Units Start: 08/13/24 1400 SCDs Start: 08/12/24 1150 Place TED hose Start: 08/12/24 1150 SCDs Start: 08/01/24 1745 Place and maintain sequential compression device Start:  07/29/24 1819  Level of care: Med-Surg   Code Status: Full Code  Subjective: Patient is seen and examined today morning. He is  sleeping  comfortably, no overnight issues.  Sitter at bedside.  Physical Exam: Vitals:   09/18/24 0328 09/18/24 0428 09/18/24 0820 09/18/24 0824  BP:  107/80 106/63 (!) 119/91  Pulse:  (!) 57 87 (!) 59  Resp:  18  20  Temp:   98.4 F (36.9 C) 97.6 F (36.4 C)  TempSrc:   Oral Oral  SpO2:  95% 99% 95%  Weight: 67.5 kg     Height:        General - Elderly Caucasian male, no apparent distress HEENT - PERRLA, EOMI, atraumatic head, non tender sinuses. Lung - Clear, basal rales, rhonchi, no wheezes. Heart - S1, S2 heard, no murmurs, rubs, no pedal edema. Abdomen - Soft, non tender, bowel sounds good Neuro -sleeping, unable to do full neuroexam exam. Skin - Warm and dry.  Data Reviewed:      Latest Ref Rng & Units 09/11/2024    9:18 AM 09/08/2024    5:58 AM 09/03/2024    1:31 AM  CBC  WBC 4.0 - 10.5 K/uL 4.1  2.8  3.9   Hemoglobin 13.0 - 17.0 g/dL 87.3  88.4  88.2   Hematocrit 39.0 - 52.0 % 39.1  35.0  35.7   Platelets 150 - 400 K/uL 202  195  239       Latest Ref Rng & Units 09/18/2024    2:10 AM 09/13/2024    5:24 AM 09/11/2024    9:18 AM  BMP  Glucose 70 - 99 mg/dL 898  94  871   BUN 6 - 20 mg/dL 26  17  18    Creatinine 0.61 - 1.24 mg/dL 9.02  9.10  8.92   Sodium 135 - 145 mmol/L 142  140  143   Potassium 3.5 - 5.1 mmol/L 4.2  3.9  4.2   Chloride 98 - 111 mmol/L 107  108  109   CO2 22 - 32 mmol/L 26  27  25    Calcium 8.9 - 10.3 mg/dL 9.5  9.1  9.6    No results found.  Family Communication: Discussed with patient, understand and agree. All questions answered.  Disposition: Status is: Inpatient Remains inpatient appropriate because: 1:1 sitter, pending placement  Planned Discharge Destination: Skilled nursing facility     Time spent: 42 minutes  Author: Concepcion Riser, MD 09/18/2024 3:16 PM Secure chat 7am to 7pm For on call review [email or web address].

## 2024-09-18 NOTE — Progress Notes (Signed)
 Speech Language Pathology Treatment: Dysphagia  Patient Details Name: Ernest Romero MRN: [id number] DOB: [birth year] Today's Date: 09/18/2024 Time: 8549-8496 SLP Time Calculation (min) (ACUTE ONLY): 13 min  Assessment / Plan / Recommendation Clinical Impression  PLAN: continue Dysphagia 3/thin liquids with cues for left head turn. Pt will cough when eating/drinking.  Despite frequent coughing since resuming POs on 12/8, breath sounds have been clear per chart review back to 12/10.  No chest imaging has been ordered since 09/02/24.  He is likely intermittently aspirating, but has not had a recurring pna. In the meantime, we continue to focus on building strength of cough and limiting aspiration through postural adjustments.    Pt concerned about persisting jaw/tongue tremors.  He sat EOB with helmet in place; fed himself ice cream and coke, continuing to require cues for left head turn.  Focused on awareness of residue and reminders to swallow with effort when he feels presence of residue.  Worked on EMST, adjusting to 30 cm/H20 resistance. Pt completed 25 trials with goal of improving expiratory support for cough.     HPI HPI: Ernest Romero is a 58 y.o. male who was admitted to [facility] 06/30/24 after an un[facility] episode in jail. Dx large SDH; underwent left frontoparietal craniotomy with evacuation of SDH and drain placement 9/28 (drain removed 9/29). Transferred to [facility] for management of crani/flap 10/14. Cranioplasty with bone flap replacement 10/27; worsening neuro with midline shift; underwent  left burr hole for evacuation 10/30. Bone flap removal on 11/10.  Bedside swallow eval 10/28 after change in mentaion and acute change in swallowing; NPO, then advanced to dysphagia 3/thin liquids 10/31.  New orders 11/18 due to coughing with POs - rec continued Dys3/thin liquids. Repeat orders 11/28 due to acute change again in swallow. MBS completed on 11/29 - severe oropharyngeal dysphagia with aspiration  of all liquids and puree solids; rec NPO. Repeat MBS 12/4- no significant change. Cortrak placed. [name] 12/1 with evolving postoperative changes from left sided hemicraniectomy and bone flap removal without evidence of an acute intracranial abnormality or significant residual subdural collection. MBS 12/8 rec Dys3/thin with L headturn      SLP Plan  Continue with current plan of care        Swallow Evaluation Recommendations   Recommendations: PO diet PO Diet Recommendation: Dysphagia 3 (Mechanical soft);Thin liquids (Level 0) Liquid Administration via: Cup;Straw Medication Administration: Whole meds with puree Supervision: Patient able to self-feed Postural changes: Position pt fully upright for meals Oral care recommendations: Oral care QID (4x/day)     Recommendations                     Oral care QID   Frequent or constant Supervision/Assistance Dysphagia, oropharyngeal phase (R13.12)     Continue with current plan of care   Ernest Bran L. Vona, MA CCC/SLP Clinical Specialist - Acute Care SLP Acute Rehabilitation Services Office number [phone]   Ernest Romero  09/18/2024, 3:10 PM

## 2024-09-18 NOTE — Progress Notes (Signed)
 Physical Therapy Treatment Patient Details Name: Ernest Romero MRN: [id number] DOB: 30-Apr-1966 Today's Date: 09/18/2024   History of Present Illness 58 y.o. male presenting 07/16/24 from [facility] where he was admitted 06/30/[facility] for unresponsive episode in the jail. 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 On CIWA protocol. Transferred to [facility] for further management of craniotomy and flap. 10/27 s/p cranioplasty with bone flap replacement. 10/28 Change in status thought to be related to Klonopin  administration 10/28, repeat CTH shows significant increase in size of mixed attenuation subdural fluid collection underlying the L tempoparietal craniotomy site, now measuring approximately 15 mm in thickness, with associated mild mass effect and slight rightward midline shift. OR on 10/30 for SDH evacuation. [facility] 11/7 with fluid collected under bone flap. 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 Worsening dysphagia 11/29; repeat CTH stable 12/1. Cortrak placed 12/3-12/8. PMH includes alcohol use disorder, psychiatric disorder, HTN.   PT Comments  Pt progressing with mobility, though limited by c/o increased fatigue this session requiring max cues to redirect in attempts to keep pt from returning to bed. Pt requiring CGA-minA for dynamic standing balance tasks; at high risk for falls. Pt remains limited by decreased activity tolerance, poor balance strategies/postural reactions and impaired cognition. Continue to recommend post-acute rehab (< 3 hrs/day) to maximize functional mobility and independence.     If plan is discharge home, recommend the following: A lot of help with bathing/dressing/bathroom;Assistance with cooking/housework;Direct supervision/assist for medications management;Direct supervision/assist for financial management;Assist for transportation;Help with stairs or ramp for entrance;Supervision due to  cognitive status;A little help with walking and/or transfers   Can travel by private vehicle     Yes  Equipment Recommendations  Other (comment) (RW vs rollator pending progress)    Recommendations for Other Services       Precautions / Restrictions Precautions Precautions: Fall Recall of Precautions/Restrictions: Impaired Precaution/Restrictions Comments: SBP <160, helmet (pt to wear when OOB per RN) Restrictions Weight Bearing Restrictions Per Provider Order: No     Mobility  Bed Mobility Overal bed mobility: Needs Assistance Bed Mobility: Sit to Supine       Sit to supine: Supervision   General bed mobility comments: pt laying himself down without issue    Transfers Overall transfer level: Needs assistance Equipment used: None Transfers: Sit to/from Stand Sit to Stand: Contact guard assist           General transfer comment: mutliple sit<>stands (5+) at sink without DME, supervision-CGA for safety and balance    Ambulation/Gait Ambulation/Gait assistance: Contact guard assist, Min assist Gait Distance (Feet): 16 Feet Assistive device: None Gait Pattern/deviations: Step-through pattern, Decreased stride length, Shuffle, Narrow base of support, Leaning posteriorly Gait velocity: Decreased     General Gait Details: pt starting to walk himself back to bed multiple times requiring cues to return to sink to complete ADL tasks; pt eventually walking back to bed before socks donned with LOB requiring minA to prevent fall. pt declines further ambulation distance, I don't want to walk... I'm getting tired   Comptroller Bed    Modified Rankin (Stroke Patients Only)       Balance Overall balance assessment: Needs assistance Sitting-balance support: No upper extremity supported, Feet supported Sitting balance-Leahy Scale: Good Sitting balance - Comments: performing multiple bouts of seated ADL  tasks at sink  (including lower body bathing, removing socks, etc.) with good seated balance   Standing balance support: No upper extremity supported Standing balance-Leahy Scale: Fair Standing balance comment: CGA for dynamic standing tasks at sink, requires assist to reach posteriorly; requires repeated cues to stay standing and complete task                            Communication Communication Communication: No apparent difficulties  Cognition Arousal: Alert Behavior During Therapy: Flat affect   PT - Cognitive impairments: Memory, Problem solving, Safety/Judgement, Sequencing, Awareness, Attention                       PT - Cognition Comments: did not notice jaw tremor. pt upset, focused on being tired and wanting his room cleaned (NT/sitter currently doing so). following simple commands performing tasks at sink but requires ferquent redirection; pt eventually walking back to bed and laying down despite cues to continue engaging in tasks at sink   Following commands impaired: Follows one step commands with increased time, Follows multi-step commands inconsistently, Follows multi-step commands with increased time    Cueing Cueing Techniques: Verbal cues  Exercises      General Comments        Pertinent Vitals/Pain Pain Assessment Pain Assessment: No/denies pain Pain Intervention(s): Monitored during session    Home Living                          Prior Function            PT Goals (current goals can now be found in the care plan section) Progress towards PT goals: Progressing toward goals    Frequency    Min 2X/week      PT Plan      Co-evaluation              AM-PAC PT 6 Clicks Mobility   Outcome Measure  Help needed turning from your back to your side while in a flat bed without using bedrails?: A Little Help needed moving from lying on your back to sitting on the side of a flat bed without using bedrails?: A Little Help needed  moving to and from a bed to a chair (including a wheelchair)?: A Little Help needed standing up from a chair using your arms (e.g., wheelchair or bedside chair)?: A Little Help needed to walk in hospital room?: A Little Help needed climbing 3-5 steps with a railing? : A Lot 6 Click Score: 17    End of Session Equipment Utilized During Treatment: Other (comment) (helmet) Activity Tolerance: Patient limited by fatigue Patient left: in bed;with call bell/phone within reach;with bed alarm set;with nursing/sitter in room Nurse Communication: Mobility status PT Visit Diagnosis: Other symptoms and signs involving the nervous system (R29.898);Muscle weakness (generalized) (M62.81);Unsteadiness on feet (R26.81);Other abnormalities of gait and mobility (R26.89)     Time: 9065-9049 PT Time Calculation (min) (ACUTE ONLY): 16 min  Charges:    $Therapeutic Activity: 8-22 mins PT General Charges $$ ACUTE PT VISIT: 1 Visit                      Darice Almas, PT, DPT Acute Rehabilitation Services  Personal: Secure Chat Rehab Office: [phone]  Darice LITTIE Almas 09/18/2024, 12:13 PM

## 2024-09-18 NOTE — Plan of Care (Signed)
  Problem: Education: Goal: Knowledge of General Education information will improve Description: Including pain rating scale, medication(s)/side effects and non-pharmacologic comfort measures Outcome: Progressing   Problem: Health Behavior/Discharge Planning: Goal: Ability to manage health-related needs will improve Outcome: Progressing   Problem: Clinical Measurements: Goal: Ability to maintain clinical measurements within normal limits will improve Outcome: Progressing Goal: Will remain free from infection Outcome: Progressing Goal: Diagnostic test results will improve Outcome: Progressing Goal: Respiratory complications will improve Outcome: Progressing Goal: Cardiovascular complication will be avoided Outcome: Progressing   Problem: Activity: Goal: Risk for activity intolerance will decrease Outcome: Progressing   Problem: Nutrition: Goal: Adequate nutrition will be maintained Outcome: Progressing   Problem: Coping: Goal: Level of anxiety will decrease Outcome: Progressing   Problem: Elimination: Goal: Will not experience complications related to bowel motility Outcome: Progressing Goal: Will not experience complications related to urinary retention Outcome: Progressing   Problem: Pain Managment: Goal: General experience of comfort will improve and/or be controlled Outcome: Progressing   Problem: Safety: Goal: Ability to remain free from injury will improve Outcome: Progressing   Problem: Skin Integrity: Goal: Risk for impaired skin integrity will decrease Outcome: Progressing   Problem: Education: Goal: Knowledge of the prescribed therapeutic regimen will improve Outcome: Progressing   Problem: Clinical Measurements: Goal: Usual level of consciousness will be regained or maintained. Outcome: Progressing Goal: Neurologic status will improve Outcome: Progressing Goal: Ability to maintain intracranial pressure will improve Outcome: Progressing    Problem: Skin Integrity: Goal: Demonstration of wound healing without infection will improve Outcome: Progressing

## 2024-09-19 ENCOUNTER — Inpatient Hospital Stay: Payer: MEDICAID | Admitting: Internal Medicine

## 2024-09-19 NOTE — Plan of Care (Signed)
  Problem: Education: Goal: Knowledge of General Education information will improve Description: Including pain rating scale, medication(s)/side effects and non-pharmacologic comfort measures Outcome: Progressing   Problem: Health Behavior/Discharge Planning: Goal: Ability to manage health-related needs will improve Outcome: Progressing   Problem: Clinical Measurements: Goal: Ability to maintain clinical measurements within normal limits will improve Outcome: Progressing Goal: Will remain free from infection Outcome: Progressing Goal: Diagnostic test results will improve Outcome: Progressing Goal: Respiratory complications will improve Outcome: Progressing Goal: Cardiovascular complication will be avoided Outcome: Progressing   Problem: Activity: Goal: Risk for activity intolerance will decrease Outcome: Progressing   Problem: Nutrition: Goal: Adequate nutrition will be maintained Outcome: Progressing   Problem: Coping: Goal: Level of anxiety will decrease Outcome: Progressing   Problem: Elimination: Goal: Will not experience complications related to bowel motility Outcome: Progressing Goal: Will not experience complications related to urinary retention Outcome: Progressing   Problem: Pain Managment: Goal: General experience of comfort will improve and/or be controlled Outcome: Progressing   Problem: Safety: Goal: Ability to remain free from injury will improve Outcome: Progressing   Problem: Skin Integrity: Goal: Risk for impaired skin integrity will decrease Outcome: Progressing   Problem: Education: Goal: Knowledge of the prescribed therapeutic regimen will improve Outcome: Progressing   Problem: Clinical Measurements: Goal: Usual level of consciousness will be regained or maintained. Outcome: Progressing Goal: Neurologic status will improve Outcome: Progressing Goal: Ability to maintain intracranial pressure will improve Outcome: Progressing    Problem: Skin Integrity: Goal: Demonstration of wound healing without infection will improve Outcome: Progressing

## 2024-09-19 NOTE — Progress Notes (Signed)
 Occupational Therapy Treatment Patient Details Name: Ernest Romero MRN: [id number] DOB: 07-24-1966 Today's Date: 09/19/2024   History of present illness 58 y.o. male presenting 07/16/24 from [facility] where he was [facility] 06/30/[facility]. 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 On CIWA protocol. Transferred to [facility] for further management of craniotomy and flap. 10/27 s/p cranioplasty with bone flap replacement. 10/28 Change in status thought to be related to Klonopin  administration 10/28, repeat CTH shows significant increase in size of mixed attenuation subdural fluid collection underlying the L tempoparietal craniotomy site, now measuring approximately 15 mm in thickness, with associated mild mass effect and slight rightward midline shift. OR on 10/30 for SDH evacuation. [facility] 11/7 with fluid collected under bone flap. 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 Worsening dysphagia 11/29; repeat CTH stable 12/1. Cortrak placed 12/3-12/8. PMH includes alcohol use disorder, psychiatric disorder, HTN.   OT comments  OT treatment focused on training for increased safety and independence with functional tasks and in education in therapeutic exercises to address jaw tremor and strengthening with written HEP provided (see details below). Pt currently largely demonstrating ability to complete ADLs with Set up to Contact guard assist and functional transfers/mobility with close Supervision to Contact guard assist for safety. Pt participated well in session and is making progress toward or has met all OT goals. OT goals updated this day based on pt current functional level and progress toward OT goal with new goal added for HEP to address jaw tremor and strengthening per MD note in OT order. Pt VSS on RA. Pt will benefit from continued acute OT services to address deficits and increase safety  and independence with functional tasks. Post acute discharge, pt will benefit from intensive inpatient skilled rehab services < 3 hours per day to maximize rehab potential.       If plan is discharge home, recommend the following:  Supervision due to cognitive status;Direct supervision/assist for medications management;Assistance with cooking/housework;Direct supervision/assist for financial management;Assist for transportation;Assistance with feeding;A little help with walking and/or transfers;Help with stairs or ramp for entrance;A little help with bathing/dressing/bathroom   Equipment Recommendations  BSC/3in1    Recommendations for Other Services      Precautions / Restrictions Precautions Precautions: Fall Recall of Precautions/Restrictions: Impaired Precaution/Restrictions Comments: SBP <160, helmet (pt to wear when OOB per RN) Restrictions Weight Bearing Restrictions Per Provider Order: No       Mobility Bed Mobility               General bed mobility comments: Pt ambulating to bathroom with Sitter upon OT arrival and sitting in recliner at end of session    Transfers Overall transfer level: Needs assistance Equipment used: None Transfers: Sit to/from Stand, Bed to chair/wheelchair/BSC Sit to Stand: Supervision, Contact guard assist     Step pivot transfers: Contact guard assist     General transfer comment: close Supervision to CGA for safety     Balance Overall balance assessment: Needs assistance Sitting-balance support: No upper extremity supported, Feet supported Sitting balance-Leahy Scale: Good     Standing balance support: No upper extremity supported, During functional activity Standing balance-Leahy Scale: Fair                             ADL either performed or assessed with clinical judgement   ADL  Overall ADL's : Needs assistance/impaired Eating/Feeding: Set up;Sitting   Grooming: Supervision/safety;Standing   Upper Body  Bathing: Supervision/ safety;Set up;Sitting   Lower Body Bathing: Supervison/ safety;Contact guard assist;Sit to/from stand   Upper Body Dressing : Set up;Standing   Lower Body Dressing: Supervision/safety;Sit to/from stand   Toilet Transfer: Production Manager (without an AD)   Toileting- Clothing Manipulation and Hygiene: Supervision/safety;Sit to/from stand       Functional mobility during ADLs: Contact guard assist (without an AD)      Extremity/Trunk Assessment Upper Extremity Assessment Upper Extremity Assessment: RUE deficits/detail;LUE deficits/detail RUE Deficits / Details: impaired coordination; requires increased time for motor planning RUE Coordination: decreased fine motor;decreased gross motor LUE Deficits / Details: impaired coordination; requires increased time for motor planning LUE Coordination: decreased gross motor   Lower Extremity Assessment Lower Extremity Assessment: Defer to PT evaluation        Vision       Perception     Praxis     Communication Communication Communication: Impaired Factors Affecting Communication: Other (comment) (requires increased time for processing and word finding)   Cognition Arousal: Alert Behavior During Therapy: Flat affect Cognition: Cognition impaired     Awareness: Intellectual awareness intact, Online awareness impaired Memory impairment (select all impairments): Working civil service fast streamer, Short-term memory Attention impairment (select first level of impairment): Divided attention Executive functioning impairment (select all impairments): Organization, Sequencing, Reasoning, Problem solving OT - Cognition Comments: Pt AAOx4 and pleasant throughout session. Pt requiring increased time for processing, word finding, and motor planning.                 Following commands: Impaired Following commands impaired: Follows one step commands with increased time, Follows multi-step commands  inconsistently, Follows multi-step commands with increased time      Cueing   Cueing Techniques: Verbal cues, Gestural cues, Visual cues  Exercises Exercises: Other exercises Other Exercises Other Exercises: Jaw opening/closing; 10 reps; seated; address jaw tremor and strengthening Other Exercises: Side-to-side jaw movement; 10 reps; sitting; to address jaw tremor and strengthening Other Exercises: Jaw protraction; 10 reps; sitting; to address jaw tremor and strengthening Other Exercises: Provided/reviewed HEP with above 3 exercises (Medbridge access code: [email or web address])    Shoulder Instructions       General Comments VSS. Sitter present throughout session.    Pertinent Vitals/ Pain       Pain Assessment Pain Assessment: No/denies pain Pain Intervention(s): Monitored during session  Home Living                                          Prior Functioning/Environment              Frequency  Min 2X/week        Progress Toward Goals  OT Goals(current goals can now be found in the care plan section)  Progress towards OT goals: Progressing toward goals;Goals updated (New goal added wtih address HEP for jaw exercises per MD note in OT order)  ADL Goals Pt Will Perform Grooming: with modified independence;standing Pt Will Perform Upper Body Bathing: with set-up;sitting Pt Will Perform Lower Body Dressing: with modified independence;sit to/from stand Pt Will Transfer to Toilet: with supervision;ambulating;regular height toilet Additional ADL Goal #2: Patient will demonstrate ability to participate in home exercise program to address jaw tremor and strengthening with Supervision and written HEP provided.  Plan  Co-evaluation                 AM-PAC OT 6 Clicks Daily Activity     Outcome Measure   Help from another person eating meals?: A Little Help from another person taking care of personal grooming?: A Little Help from another person  toileting, which includes using toliet, bedpan, or urinal?: A Little Help from another person bathing (including washing, rinsing, drying)?: A Little Help from another person to put on and taking off regular upper body clothing?: A Little Help from another person to put on and taking off regular lower body clothing?: A Little 6 Click Score: 18    End of Session Equipment Utilized During Treatment: Other (comment);Gait belt (helmet)  OT Visit Diagnosis: Other abnormalities of gait and mobility (R26.89);Muscle weakness (generalized) (M62.81);Other symptoms and signs involving the nervous system (R29.898);Cognitive communication deficit (R41.841);Other symptoms and signs involving cognitive function Symptoms and signs involving cognitive functions:  (SDH s/p L frontoparietal craniotomy with evacuation of SDH)   Activity Tolerance Patient tolerated treatment well   Patient Left in chair;with call bell/phone within reach;with chair alarm set;with nursing/sitter in room   Nurse Communication Mobility status;Other (comment) (OT provided HEP with jaw exercises per MD note in OT order)        Time: 8373-8351 OT Time Calculation (min): 22 min  Charges: OT General Charges $OT Visit: 1 Visit OT Treatments $Self Care/Home Management : 8-22 mins  Margarie Rockey HERO., OTR/L, MA Acute Rehab [phone]   Margarie FORBES Horns 09/19/2024, 5:54 PM

## 2024-09-19 NOTE — Progress Notes (Signed)
 Progress Note   Patient: Ernest Romero FMW:[id number] DOB: [birth year] DOA: 07/15/2024     66 DOS: the patient was seen and examined on 09/19/2024   Brief hospital course:   Assessment and Plan: Subdural hematoma /  Status post initial craniotomy 9/28 (Dr. 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 Evaluated by infectious disease who recommends 6 weeks of IV vancomycin , EOT 09/23/2024.  After completion of IV antibiotics he will need 8 weeks of doxycycline . Will need PICC line placed closer to discharge date. Completed 1 week of Keppra  Followed by neurosurgery. Sutures were removed without complication by neurosurgery on 12/3.  EVD sutures remain intact, no signs of drainage or erythema, recommended do not remove at this time    Acute metabolic encephalopathy At baseline, patient reportedly without cognitive impairment, was homeless and independent caring for self. At some point, during this hospitalization patient was deemed not to have capacity, due to encephalopathy and neurological deficits. Per recent physicians notes patient appears to be able to make decision for himself moving forward. Continue to monitor, capacity could fluctuates.  Psychiatry readjusted medications to reduce clonazepam  and valbenazine .  Subsequently became alert, agitated with worsening jaw tremor Appreciate neurology recommendations, psych will need to continue following and make necessary adjustments to the regimen. Continue Klonopin  to 0.5 mg 3 times daily. Cortrack removed on 12/8, patient tolerating diet.  Patient is requiring one-to-one sitter for safety.  Plan to discontinue sitter for SNF.  Placement is challenging.  Bipolar disorder/akathisia/drug-induced parkinsonism: Patient was on Haldol  and Latuda , developed facial tremors 11/20 concern for tardive dyskinesia. EEG negative for seizures. Latuda  and Haldol   discontinued. Patient was started on propranolol  and Klonopin .  On Ingrezza  Currently on sleepy with sitter at bedside, management per psych.     Dysphagia 11/29: Overnight after medications given with water  patient felt something got stuck, he require suctioning.  Evaluated by speech. MBS: sign of aspiration.  Alert and oriented, core track removed on 12/8, tolerating diet and meds.   Aspiration PNA;  Acute hypoxic Resp failure.  In the setting of dysphagia and aspiration   Completed 5 days of IV Unasyn    History of alcohol, tobacco use disorder: Continue thiamine , folic acid  and multivitamin Treated for alcohol withdrawal delirium with phenobarbital  taper at [facility]. Resolved   Essential hypertension: BP stable  Amlodipine  and losartan  has been discontinued. Propranolol  dose decreased to avoid hypotension   Constipation: Continue laxatives.    Bradycardia Holding parameters on for propranolol     Severe malnutrition: Continue ensure.  Started on tube feeds but now tolerating diet.   Iron deficiency anemia; started  iron supplement.       Out of bed to chair. Incentive spirometry. Nursing supportive care. Fall, aspiration precautions. Diet:  Diet Orders (From admission, onward)     Start     Ordered   09/09/24 1508  DIET DYS 3 Room service appropriate? Yes with Assist; Fluid consistency: Thin  Diet effective now       Comments: Crush meds with puree; full supervision  Question Answer Comment  Room service appropriate? Yes with Assist   Fluid consistency: Thin      09/09/24 1507           DVT prophylaxis: heparin  injection 5,000 Units Start: 08/13/24 1400 SCDs Start: 08/12/24 1150 Place TED hose Start: 08/12/24 1150 SCDs Start: 08/01/24 1745 Place and maintain sequential compression device Start: 07/29/24 1819  Level of care: Med-Surg   Code Status: Full Code  Subjective: Patient is seen and examined today morning. He is sleeping  comfortably, later I  saw him walking with sitter wearing helmet. Eating fair.  Physical Exam: Vitals:   09/18/24 2109 09/19/24 0006 09/19/24 0500 09/19/24 0838  BP: 134/85   110/66  Pulse: 78   65  Resp:  18  17  Temp:    97.8 F (36.6 C)  TempSrc:    Oral  SpO2:    98%  Weight:   65.6 kg   Height:        General - Elderly Caucasian male, no apparent distress HEENT - PERRLA, EOMI, atraumatic head, non tender sinuses. Lung - Clear, basal rales, rhonchi, no wheezes. Heart - S1, S2 heard, no murmurs, rubs, no pedal edema. Abdomen - Soft, non tender, bowel sounds good Neuro -alert, awake, no new focal deficits. Skin - Warm and dry.  Data Reviewed:      Latest Ref Rng & Units 09/11/2024    9:18 AM 09/08/2024    5:58 AM 09/03/2024    1:31 AM  CBC  WBC 4.0 - 10.5 K/uL 4.1  2.8  3.9   Hemoglobin 13.0 - 17.0 g/dL 87.3  88.4  88.2   Hematocrit 39.0 - 52.0 % 39.1  35.0  35.7   Platelets 150 - 400 K/uL 202  195  239       Latest Ref Rng & Units 09/18/2024    2:10 AM 09/13/2024    5:24 AM 09/11/2024    9:18 AM  BMP  Glucose 70 - 99 mg/dL 898  94  871   BUN 6 - 20 mg/dL 26  17  18    Creatinine 0.61 - 1.24 mg/dL 9.02  9.10  8.92   Sodium 135 - 145 mmol/L 142  140  143   Potassium 3.5 - 5.1 mmol/L 4.2  3.9  4.2   Chloride 98 - 111 mmol/L 107  108  109   CO2 22 - 32 mmol/L 26  27  25    Calcium 8.9 - 10.3 mg/dL 9.5  9.1  9.6    No results found.  Family Communication: no family at bedside  Disposition: Status is: Inpatient Remains inpatient appropriate because: 1:1 sitter, pending placement  Planned Discharge Destination: Skilled nursing facility     Time spent: 41 minutes  Author: Concepcion Riser, MD 09/19/2024 1:41 PM Secure chat 7am to 7pm For on call review [email or web address].

## 2024-09-20 NOTE — Progress Notes (Signed)
 Physical Therapy Treatment Patient Details Name: Ernest Romero MRN: [id number] DOB: October 12, 1965 Today's Date: 09/20/2024   History of Present Illness 58 y.o. male presenting 07/16/24 from [facility] where he was admitted 06/30/[facility] in [facility] jail. 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 On CIWA protocol. Transferred to [facility] for further management of craniotomy and flap. 10/27 s/p cranioplasty with bone flap replacement. 10/28 Change in status thought to be related to Klonopin  administration 10/28, repeat CTH shows significant increase in size of mixed attenuation subdural fluid collection underlying the L tempoparietal craniotomy site, now measuring approximately 15 mm in thickness, with associated mild mass effect and slight rightward midline shift. OR on 10/30 for SDH evacuation. [facility] 11/7 with fluid collected under bone flap. 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 Worsening dysphagia 11/29; repeat CTH stable 12/1. Cortrak placed 12/3-12/8. PMH includes alcohol use disorder, psychiatric disorder, HTN.    PT Comments  Progressing towards goals. Pt states he feels unmotivated today but agreeable to work with therapy a bit. We tried using a rollator which he enjoyed and it provided adequate support to maintain balance. He does need some cues for awareness when navigating obstacles to leave more room for clearance. Without AD pt still shows some mild instability needing close CGA today. Reviewed safety awareness, encouraged OOB several times a day with staff as tolerated. Patient will continue to benefit from skilled physical therapy services to further improve independence with functional mobility.     If plan is discharge home, recommend the following: Assistance with cooking/housework;Direct supervision/assist for medications management;Direct supervision/assist for financial  management;Assist for transportation;Help with stairs or ramp for entrance;Supervision due to cognitive status;A little help with walking and/or transfers;A little help with bathing/dressing/bathroom   Can travel by private vehicle     Yes  Equipment Recommendations  Rollator (4 wheels)    Recommendations for Other Services       Precautions / Restrictions Precautions Precautions: Fall Recall of Precautions/Restrictions: Impaired Precaution/Restrictions Comments: SBP <160, helmet (pt to wear when OOB per RN) Restrictions Weight Bearing Restrictions Per Provider Order: No     Mobility  Bed Mobility Overal bed mobility: Needs Assistance Bed Mobility: Supine to Sit, Sit to Supine     Supine to sit: Supervision Sit to supine: Supervision   General bed mobility comments: Supervision for safety, donned helmet for pt. No physical assist to rise to return to bed.    Transfers Overall transfer level: Needs assistance Equipment used: Rollator (4 wheels) Transfers: Sit to/from Stand Sit to Stand: Supervision           General transfer comment: Educated on transfer techniques using rollator (brake application and alignment prior to rising.) Performed with supervision. Pt does push to the side prior to sitting and needs cues for proper line up before sitting to maximize safety.    Ambulation/Gait Ambulation/Gait assistance: Contact guard assist Gait Distance (Feet): 175 Feet Assistive device: None, Rollator (4 wheels) Gait Pattern/deviations: Step-through pattern, Decreased stride length, Narrow base of support, Drifts right/left Gait velocity: Decreased Gait velocity interpretation: <1.8 ft/sec, indicate of risk for recurrent falls   General Gait Details: Educated on safe AD use with rollator for support. Demonstrates adequate stability but needs cues for awareness and to anticipate clearing around obstacles with adequate distance (bumps into items on a couple occasions.) No  buckling or overt LOB. Further training without AD showing good  control with minor drift, reduced speed close guard for safety.   Stairs             Wheelchair Mobility     Tilt Bed    Modified Rankin (Stroke Patients Only)       Balance Overall balance assessment: Needs assistance Sitting-balance support: No upper extremity supported, Feet supported Sitting balance-Leahy Scale: Good     Standing balance support: No upper extremity supported, During functional activity Standing balance-Leahy Scale: Fair Standing balance comment: Supervision for safety standing stationary.                            Communication Communication Communication: Impaired Factors Affecting Communication: Other (comment) (requires increased time for processing and word finding)  Cognition Arousal: Alert Behavior During Therapy: Flat affect   PT - Cognitive impairments: Memory, Problem solving, Safety/Judgement, Sequencing, Awareness, Attention                         Following commands: Impaired Following commands impaired: Follows multi-step commands inconsistently, Follows multi-step commands with increased time    Cueing Cueing Techniques: Verbal cues, Gestural cues  Exercises      General Comments General comments (skin integrity, edema, etc.): sitter present. States he doesn't feeling like doing a lot more today. he is feeling unmotivated.      Pertinent Vitals/Pain Pain Assessment Pain Assessment: No/denies pain    Home Living                          Prior Function            PT Goals (current goals can now be found in the care plan section) Acute Rehab PT Goals Patient Stated Goal: none stated Progress towards PT goals: Progressing toward goals    Frequency    Min 2X/week      PT Plan      Co-evaluation              AM-PAC PT 6 Clicks Mobility   Outcome Measure  Help needed turning from your back to your side  while in a flat bed without using bedrails?: None Help needed moving from lying on your back to sitting on the side of a flat bed without using bedrails?: None Help needed moving to and from a bed to a chair (including a wheelchair)?: A Little Help needed standing up from a chair using your arms (e.g., wheelchair or bedside chair)?: A Little Help needed to walk in hospital room?: A Little Help needed climbing 3-5 steps with a railing? : A Lot 6 Click Score: 19    End of Session Equipment Utilized During Treatment: Other (comment) (helmet) Activity Tolerance: Patient tolerated treatment well;Other (comment) (unmotivated today but agreeable to short therapy visit to try rollator.) Patient left: in bed;with call bell/phone within reach;with bed alarm set;with nursing/sitter in room Nurse Communication: Mobility status PT Visit Diagnosis: Other symptoms and signs involving the nervous system (R29.898);Muscle weakness (generalized) (M62.81);Unsteadiness on feet (R26.81);Other abnormalities of gait and mobility (R26.89)     Time: 8655-8597 PT Time Calculation (min) (ACUTE ONLY): 18 min  Charges:    $Gait Training: 8-22 mins PT General Charges $$ ACUTE PT VISIT: 1 Visit                     Leontine Roads, PT, DPT [facility]  Rehabilitation Services Physical  Therapist Office: [phone] Website: [email or web address]    Leontine GORMAN Roads 09/20/2024, 2:16 PM

## 2024-09-20 NOTE — Progress Notes (Signed)
 " Progress Note   Patient: Ernest Romero FMW:[id number] DOB: 1965-10-12 DOA: 07/15/2024     67 DOS: the patient was seen and examined on 09/20/2024   Brief hospital course:   Assessment and Plan: Subdural hematoma /  Status post initial craniotomy 9/28 (Dr. 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 Evaluated by infectious disease who recommends 6 weeks of IV vancomycin , EOT 09/23/2024.  After completion of IV antibiotics he will need 8 weeks of doxycycline . Will need PICC line placed closer to discharge date. Completed 1 week of Keppra  Followed by neurosurgery. Sutures were removed without complication by neurosurgery on 12/3.  EVD sutures remain intact, no signs of drainage or erythema, recommended do not remove at this time    Acute metabolic encephalopathy At baseline, patient reportedly without cognitive impairment, was homeless and independent caring for self. At some point, during this hospitalization patient was deemed not to have capacity, due to encephalopathy and neurological deficits. Per recent physicians notes patient appears to be able to make decision for himself moving forward. Continue to monitor, capacity could fluctuates.  Psychiatry readjusted medications to reduce clonazepam  and valbenazine .  Subsequently became alert, agitated with worsening jaw tremor. Appreciate neurology recommendations, psych will need to continue following and make necessary adjustments to the regimen. Continue Klonopin  to 0.5 mg 3 times daily. Cortrack removed on 12/8, patient tolerating diet.  Patient is requiring one-to-one sitter for safety.  Plan to discontinue sitter for SNF.  Placement is challenging.  Bipolar disorder/akathisia/drug-induced parkinsonism: Patient was on Haldol  and Latuda , developed facial tremors 11/20 concern for tardive dyskinesia. EEG negative for seizures. Latuda  and Haldol   discontinued. Patient was started on propranolol  and Klonopin .  On Ingrezza  Currently on sleepy with sitter at bedside, management per psych.     Dysphagia Evaluated by speech. MBS: sign of aspiration.  Alert and oriented, core track removed on 12/8, tolerating diet and meds.   Aspiration PNA;  Acute hypoxic Resp failure.  In the setting of dysphagia and aspiration   Completed 5 days of IV Unasyn    History of alcohol, tobacco use disorder: Continue thiamine , folic acid  and multivitamin Treated for alcohol withdrawal delirium with phenobarbital  taper at [facility] Resolved   Essential hypertension: BP stable  Amlodipine  and losartan  has been discontinued. Propranolol  dose decreased to avoid hypotension   Constipation: Continue laxatives.    Bradycardia Holding parameters on for propranolol     Severe malnutrition: Continue ensure.  Started on tube feeds but now tolerating diet.   Iron deficiency anemia; started  iron supplement.       Out of bed to chair. Incentive spirometry. Nursing supportive care. Fall, aspiration precautions. Diet:  Diet Orders (From admission, onward)     Start     Ordered   09/09/24 1508  DIET DYS 3 Room service appropriate? Yes with Assist; Fluid consistency: Thin  Diet effective now       Comments: Crush meds with puree; full supervision  Question Answer Comment  Room service appropriate? Yes with Assist   Fluid consistency: Thin      09/09/24 1507           DVT prophylaxis: heparin  injection 5,000 Units Start: 08/13/24 1400 SCDs Start: 08/12/24 1150 Place TED hose Start: 08/12/24 1150 SCDs Start: 08/01/24 1745 Place and maintain sequential compression device Start: 07/29/24 1819  Level of care: Med-Surg   Code Status: Full Code  Subjective: Patient is  seen and examined today morning. He is sleeping  comfortably, sitter at bedside. He is able to ambulate well, eating fair per sitter.  Physical Exam: Vitals:   09/19/24 2336  09/20/24 0357 09/20/24 0500 09/20/24 0849  BP: (!) 113/90 104/73  (!) 155/78  Pulse: 65 (!) 57  60  Resp: 18 18    Temp: 97.7 F (36.5 C) 97.6 F (36.4 C)    TempSrc: Oral Oral    SpO2: 96% 95%    Weight:   68.4 kg   Height:        General - Elderly Caucasian male, no apparent distress HEENT - PERRLA, EOMI, atraumatic head, non tender sinuses. Lung - Clear, basal rales, rhonchi, no wheezes. Heart - S1, S2 heard, no murmurs, rubs, no pedal edema. Abdomen - Soft, non tender, bowel sounds good Neuro -sleeping, no new focal deficits. Skin - Warm and dry.  Data Reviewed:      Latest Ref Rng & Units 09/11/2024    9:18 AM 09/08/2024    5:58 AM 09/03/2024    1:31 AM  CBC  WBC 4.0 - 10.5 K/uL 4.1  2.8  3.9   Hemoglobin 13.0 - 17.0 g/dL 87.3  88.4  88.2   Hematocrit 39.0 - 52.0 % 39.1  35.0  35.7   Platelets 150 - 400 K/uL 202  195  239       Latest Ref Rng & Units 09/18/2024    2:10 AM 09/13/2024    5:24 AM 09/11/2024    9:18 AM  BMP  Glucose 70 - 99 mg/dL 898  94  871   BUN 6 - 20 mg/dL 26  17  18    Creatinine 0.61 - 1.24 mg/dL 9.02  9.10  8.92   Sodium 135 - 145 mmol/L 142  140  143   Potassium 3.5 - 5.1 mmol/L 4.2  3.9  4.2   Chloride 98 - 111 mmol/L 107  108  109   CO2 22 - 32 mmol/L 26  27  25    Calcium 8.9 - 10.3 mg/dL 9.5  9.1  9.6    No results found.  Family Communication: no family at bedside  Disposition: Status is: Inpatient Remains inpatient appropriate because: 1:1 sitter, pending placement  Planned Discharge Destination: Skilled nursing facility     Time spent: 40 minutes  Author: Concepcion Riser, MD 09/20/2024 2:31 PM Secure chat 7am to 7pm For on call review [email or web address]    "

## 2024-09-21 MED ORDER — DOXYCYCLINE HYCLATE 100 MG PO TABS: 100.0000 mg | ORAL_TABLET | Freq: Two times a day (BID) | ORAL | Status: DC

## 2024-09-21 NOTE — Progress Notes (Signed)
 " Progress Note   Patient: Ernest Romero FMW:[id number] DOB: [birth year] DOA: 07/15/2024     68 DOS: the patient was seen and examined on 09/21/2024   Brief hospital course:   Assessment and Plan: Subdural hematoma /  Status post initial craniotomy 9/28 (Dr. 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 Evaluated by infectious disease who recommends 6 weeks of IV vancomycin , EOT 09/23/2024.  After completion of IV antibiotics he will need 8 weeks of doxycycline . Completed 1 week of Keppra . Followed by neurosurgery. Sutures were removed without complication by neurosurgery on 12/3.  EVD sutures remain intact, no signs of drainage or erythema, recommended do not remove at this time    Acute metabolic encephalopathy At baseline, patient reportedly without cognitive impairment, was homeless and independent caring for self. At some point, during this hospitalization patient was deemed not to have capacity, due to encephalopathy and neurological deficits. Per recent physicians notes patient appears to be able to make decision for himself moving forward. Continue to monitor, capacity could fluctuates.  Psychiatry readjusted medications to reduce clonazepam  and valbenazine .  Subsequently became alert, agitated with worsening jaw tremor. Appreciate neurology recommendations, psych will need to continue following and make necessary adjustments to the regimen. Continue Klonopin  to 0.5 mg 3 times daily. Cortrack removed on 12/8, patient tolerating diet.  Patient is requiring one-to-one sitter for safety.  Plan to discontinue sitter for SNF.  Placement is challenging.  Bipolar disorder/akathisia/drug-induced parkinsonism: Patient was on Haldol  and Latuda , developed facial tremors 11/20 concern for tardive dyskinesia. EEG negative for seizures. Latuda  and Haldol  discontinued. Patient was started on propranolol  and  Klonopin .  On Ingrezza  Currently on sleepy with sitter at bedside, management per psych.     Dysphagia Evaluated by speech. MBS: sign of aspiration.  Alert and oriented, core track removed on 12/8, tolerating diet and meds.   Aspiration PNA;  Acute hypoxic Resp failure.  In the setting of dysphagia and aspiration   Completed 5 days of IV Unasyn    History of alcohol, tobacco use disorder: Continue thiamine , folic acid  and multivitamin Treated for alcohol withdrawal delirium with phenobarbital  taper at [facility]. Resolved   Essential hypertension: BP stable  Amlodipine  and losartan  has been discontinued. Propranolol  dose decreased to avoid hypotension   Constipation: Continue laxatives.    Bradycardia Holding parameters on for propranolol     Severe malnutrition: Continue ensure.  Started on tube feeds but now tolerating diet.   Iron deficiency anemia; started  iron supplement.       Out of bed to chair. Incentive spirometry. Nursing supportive care. Fall, aspiration precautions. Diet:  Diet Orders (From admission, onward)     Start     Ordered   09/09/24 1508  DIET DYS 3 Room service appropriate? Yes with Assist; Fluid consistency: Thin  Diet effective now       Comments: Crush meds with puree; full supervision  Question Answer Comment  Room service appropriate? Yes with Assist   Fluid consistency: Thin      09/09/24 1507           DVT prophylaxis: heparin  injection 5,000 Units Start: 08/13/24 1400 SCDs Start: 08/12/24 1150 Place TED hose Start: 08/12/24 1150 SCDs Start: 08/01/24 1745 Place and maintain sequential compression device Start: 07/29/24 1819  Level of care: Med-Surg   Code Status: Full Code  Subjective: Patient is seen and examined today morning. He is sleeping  comfortably, sitter at bedside. No overnight issues.  Physical Exam: Vitals:   09/20/24 1651 09/20/24 2017 09/21/24 0500 09/21/24 0752  BP: 120/84 (!) 122/90  122/86  Pulse: 68 73   (!) 55  Resp: 20 18    Temp: 97.7 F (36.5 C) 97.7 F (36.5 C)  97.8 F (36.6 C)  TempSrc: Oral Oral    SpO2: 95% 97%  97%  Weight:   66.1 kg   Height:        General - Elderly Caucasian male, no apparent distress HEENT - PERRLA, EOMI, atraumatic head, non tender sinuses. Lung - Clear, basal rales, rhonchi, no wheezes. Heart - S1, S2 heard, no murmurs, rubs, no pedal edema. Abdomen - Soft, non tender, bowel sounds good Neuro -sleeping, no new focal deficits. Skin - Warm and dry.  Data Reviewed:      Latest Ref Rng & Units 09/11/2024    9:18 AM 09/08/2024    5:58 AM 09/03/2024    1:31 AM  CBC  WBC 4.0 - 10.5 K/uL 4.1  2.8  3.9   Hemoglobin 13.0 - 17.0 g/dL 87.3  88.4  88.2   Hematocrit 39.0 - 52.0 % 39.1  35.0  35.7   Platelets 150 - 400 K/uL 202  195  239       Latest Ref Rng & Units 09/18/2024    2:10 AM 09/13/2024    5:24 AM 09/11/2024    9:18 AM  BMP  Glucose 70 - 99 mg/dL 898  94  871   BUN 6 - 20 mg/dL 26  17  18    Creatinine 0.61 - 1.24 mg/dL 9.02  9.10  8.92   Sodium 135 - 145 mmol/L 142  140  143   Potassium 3.5 - 5.1 mmol/L 4.2  3.9  4.2   Chloride 98 - 111 mmol/L 107  108  109   CO2 22 - 32 mmol/L 26  27  25    Calcium 8.9 - 10.3 mg/dL 9.5  9.1  9.6    No results found.  Family Communication: no family at bedside  Disposition: Status is: Inpatient Remains inpatient appropriate because: 1:1 sitter, pending placement  Planned Discharge Destination: Skilled nursing facility     Time spent: 42 minutes  Author: Concepcion Riser, MD 09/21/2024 2:54 PM Secure chat 7am to 7pm For on call review [email or web address]    "

## 2024-09-21 NOTE — Plan of Care (Signed)
  Problem: Education: Goal: Knowledge of General Education information will improve Description: Including pain rating scale, medication(s)/side effects and non-pharmacologic comfort measures Outcome: Progressing   Problem: Health Behavior/Discharge Planning: Goal: Ability to manage health-related needs will improve Outcome: Progressing   Problem: Clinical Measurements: Goal: Ability to maintain clinical measurements within normal limits will improve Outcome: Progressing Goal: Will remain free from infection Outcome: Progressing Goal: Diagnostic test results will improve Outcome: Progressing Goal: Respiratory complications will improve Outcome: Progressing Goal: Cardiovascular complication will be avoided Outcome: Progressing   Problem: Activity: Goal: Risk for activity intolerance will decrease Outcome: Progressing   Problem: Nutrition: Goal: Adequate nutrition will be maintained Outcome: Progressing   Problem: Coping: Goal: Level of anxiety will decrease Outcome: Progressing   Problem: Elimination: Goal: Will not experience complications related to bowel motility Outcome: Progressing Goal: Will not experience complications related to urinary retention Outcome: Progressing   Problem: Pain Managment: Goal: General experience of comfort will improve and/or be controlled Outcome: Progressing   Problem: Safety: Goal: Ability to remain free from injury will improve Outcome: Progressing   Problem: Skin Integrity: Goal: Risk for impaired skin integrity will decrease Outcome: Progressing   Problem: Education: Goal: Knowledge of the prescribed therapeutic regimen will improve Outcome: Progressing   Problem: Clinical Measurements: Goal: Usual level of consciousness will be regained or maintained. Outcome: Progressing Goal: Neurologic status will improve Outcome: Progressing Goal: Ability to maintain intracranial pressure will improve Outcome: Progressing    Problem: Skin Integrity: Goal: Demonstration of wound healing without infection will improve Outcome: Progressing

## 2024-09-22 NOTE — Progress Notes (Signed)
 " Progress Note   Patient: Ernest Romero FMW:[id number] DOB: 05/04/1966 DOA: 07/15/2024     69 DOS: the patient was seen and examined on 09/22/2024   Brief hospital course:   Assessment and Plan: Subdural hematoma /  Status post initial craniotomy 9/28 (Dr. 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 Evaluated by infectious disease who recommends 6 weeks of IV vancomycin , EOT 09/23/2024.  After completion of IV antibiotics he will need 8 weeks of doxycycline . Completed 1 week of Keppra . Followed by neurosurgery. Sutures were removed without complication by neurosurgery on 12/3.  EVD sutures remain intact, no signs of drainage or erythema, recommended do not remove at this time    Acute metabolic encephalopathy At baseline, patient reportedly without cognitive impairment, was homeless and independent caring for self. At some point, during this hospitalization patient was deemed not to have capacity, due to encephalopathy and neurological deficits. Per recent physicians notes patient appears to be able to make decision for himself moving forward. Continue to monitor, capacity could fluctuates.  Psychiatry readjusted medications to reduce clonazepam  and valbenazine .  Subsequently became alert, agitated with worsening jaw tremor. Appreciate neurology recommendations, psych will need to continue following and make necessary adjustments to the regimen. Continue Klonopin  to 0.5 mg 3 times daily. Cortrack removed on 12/8, patient tolerating diet.  Patient is currently requiring one-to-one sitter for safety.  Plan to discontinue sitter for SNF.  Placement is challenging.  Bipolar disorder/akathisia/drug-induced parkinsonism: Patient was on Haldol  and Latuda , developed facial tremors 11/20 concern for tardive dyskinesia. EEG negative for seizures. Latuda  and Haldol  discontinued. Patient was started on  propranolol  and Klonopin .  On Ingrezza  Sleeps most of the time, continue management per psych.     Dysphagia Evaluated by speech. MBS: sign of aspiration.  Alert and oriented, core track removed on 12/8, tolerating diet and meds.   Aspiration PNA;  Acute hypoxic Resp failure.  In the setting of dysphagia and aspiration   Completed 5 days of IV Unasyn    History of alcohol, tobacco use disorder: Continue thiamine , folic acid  and multivitamin Treated for alcohol withdrawal delirium with phenobarbital  taper at [facility]. Resolved   Essential hypertension: BP stable  Amlodipine  and losartan  has been discontinued. Propranolol  dose decreased to avoid hypotension   Constipation: Continue laxatives.    Bradycardia Holding parameters on for propranolol     Severe malnutrition: Continue ensure.  Started on tube feeds but now tolerating diet.   Iron deficiency anemia; started  iron supplement.       Out of bed to chair. Incentive spirometry. Nursing supportive care. Fall, aspiration precautions. Diet:  Diet Orders (From admission, onward)     Start     Ordered   09/09/24 1508  DIET DYS 3 Room service appropriate? Yes with Assist; Fluid consistency: Thin  Diet effective now       Comments: Crush meds with puree; full supervision  Question Answer Comment  Room service appropriate? Yes with Assist   Fluid consistency: Thin      09/09/24 1507           DVT prophylaxis: heparin  injection 5,000 Units Start: 08/13/24 1400 SCDs Start: 08/12/24 1150 Place TED hose Start: 08/12/24 1150 SCDs Start: 08/01/24 1745 Place and maintain sequential compression device Start: 07/29/24 1819  Level of care: Med-Surg   Code Status: Full Code  Subjective: Patient is seen and examined today morning. He is sleeping  comfortably, sitter at bedside. No overnight issues  Physical Exam: Vitals:   09/22/24 0703 09/22/24 0803 09/22/24 1230 09/22/24 1627  BP:  (!) 129/100 103/82 (!) 118/90   Pulse:  67 68 71  Resp:  18 18 19   Temp:  97.9 F (36.6 C) 98 F (36.7 C) 97.8 F (36.6 C)  TempSrc:  Oral Axillary Axillary  SpO2:  98% 98% 98%  Weight: 67.4 kg     Height:        General - Elderly Caucasian male, no apparent distress HEENT - PERRLA, EOMI, atraumatic head, non tender sinuses. Lung - Clear, basal rales, rhonchi, no wheezes. Heart - S1, S2 heard, no murmurs, rubs, no pedal edema. Abdomen - Soft, non tender, bowel sounds good Neuro -sleeping, no new focal deficits. Skin - Warm and dry.  Data Reviewed:      Latest Ref Rng & Units 09/11/2024    9:18 AM 09/08/2024    5:58 AM 09/03/2024    1:31 AM  CBC  WBC 4.0 - 10.5 K/uL 4.1  2.8  3.9   Hemoglobin 13.0 - 17.0 g/dL 87.3  88.4  88.2   Hematocrit 39.0 - 52.0 % 39.1  35.0  35.7   Platelets 150 - 400 K/uL 202  195  239       Latest Ref Rng & Units 09/18/2024    2:10 AM 09/13/2024    5:24 AM 09/11/2024    9:18 AM  BMP  Glucose 70 - 99 mg/dL 898  94  871   BUN 6 - 20 mg/dL 26  17  18    Creatinine 0.61 - 1.24 mg/dL 9.02  9.10  8.92   Sodium 135 - 145 mmol/L 142  140  143   Potassium 3.5 - 5.1 mmol/L 4.2  3.9  4.2   Chloride 98 - 111 mmol/L 107  108  109   CO2 22 - 32 mmol/L 26  27  25    Calcium 8.9 - 10.3 mg/dL 9.5  9.1  9.6    No results found.  Family Communication: no family at bedside  Disposition: Status is: Inpatient Remains inpatient appropriate because: 1:1 sitter, pending placement  Planned Discharge Destination: Skilled nursing facility     Time spent: 44 minutes  Author: Concepcion Riser, MD 09/22/2024 6:56 PM Secure chat 7am to 7pm For on call review [email or web address]    "

## 2024-09-22 NOTE — Plan of Care (Signed)
  Problem: Education: Goal: Knowledge of General Education information will improve Description: Including pain rating scale, medication(s)/side effects and non-pharmacologic comfort measures Outcome: Progressing   Problem: Health Behavior/Discharge Planning: Goal: Ability to manage health-related needs will improve Outcome: Progressing   Problem: Clinical Measurements: Goal: Ability to maintain clinical measurements within normal limits will improve Outcome: Progressing Goal: Will remain free from infection Outcome: Progressing Goal: Diagnostic test results will improve Outcome: Progressing Goal: Respiratory complications will improve Outcome: Progressing Goal: Cardiovascular complication will be avoided Outcome: Progressing   Problem: Activity: Goal: Risk for activity intolerance will decrease Outcome: Progressing   Problem: Nutrition: Goal: Adequate nutrition will be maintained Outcome: Progressing   Problem: Coping: Goal: Level of anxiety will decrease Outcome: Progressing   Problem: Elimination: Goal: Will not experience complications related to bowel motility Outcome: Progressing Goal: Will not experience complications related to urinary retention Outcome: Progressing   Problem: Pain Managment: Goal: General experience of comfort will improve and/or be controlled Outcome: Progressing   Problem: Safety: Goal: Ability to remain free from injury will improve Outcome: Progressing   Problem: Skin Integrity: Goal: Risk for impaired skin integrity will decrease Outcome: Progressing   Problem: Education: Goal: Knowledge of the prescribed therapeutic regimen will improve Outcome: Progressing   Problem: Clinical Measurements: Goal: Usual level of consciousness will be regained or maintained. Outcome: Progressing Goal: Neurologic status will improve Outcome: Progressing Goal: Ability to maintain intracranial pressure will improve Outcome: Progressing    Problem: Skin Integrity: Goal: Demonstration of wound healing without infection will improve Outcome: Progressing

## 2024-09-23 ENCOUNTER — Encounter: Payer: MEDICAID | Admitting: Physician Assistant

## 2024-09-23 DIAGNOSIS — F319 Bipolar disorder, unspecified: Secondary | ICD-10-CM | POA: Diagnosis not present

## 2024-09-23 DIAGNOSIS — E43 Unspecified severe protein-calorie malnutrition: Secondary | ICD-10-CM | POA: Diagnosis not present

## 2024-09-23 DIAGNOSIS — I6200 Nontraumatic subdural hemorrhage, unspecified: Secondary | ICD-10-CM | POA: Diagnosis not present

## 2024-09-23 DIAGNOSIS — F10139 Alcohol abuse with withdrawal, unspecified: Secondary | ICD-10-CM

## 2024-09-23 DIAGNOSIS — I10 Essential (primary) hypertension: Secondary | ICD-10-CM | POA: Diagnosis not present

## 2024-09-23 DIAGNOSIS — G9341 Metabolic encephalopathy: Secondary | ICD-10-CM | POA: Diagnosis not present

## 2024-09-23 DIAGNOSIS — T8579XA Infection and inflammatory reaction due to other internal prosthetic devices, implants and grafts, initial encounter: Secondary | ICD-10-CM | POA: Diagnosis not present

## 2024-09-23 DIAGNOSIS — R4189 Other symptoms and signs involving cognitive functions and awareness: Secondary | ICD-10-CM | POA: Diagnosis not present

## 2024-09-23 MED ORDER — DOXYCYCLINE HYCLATE 100 MG PO TABS
100.0000 mg | ORAL_TABLET | Freq: Two times a day (BID) | ORAL | Status: AC
  Administered 2024-09-24 – 2024-10-07 (×28): 100 mg via ORAL
  Filled 2024-09-23 (×14): qty 1

## 2024-09-23 NOTE — Progress Notes (Signed)
 " Progress Note   Patient: Ernest Romero FMW:[id number] DOB: 1966/07/26 DOA: 07/15/2024     70 DOS: the patient was seen and examined on 09/23/2024   Brief hospital course:   Assessment and Plan: Subdural hematoma /  Status post initial craniotomy 9/28 (Dr. 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 Evaluated by infectious disease who recommends 6 weeks of IV vancomycin , which he will finish today 12/22. Discussed with ID, oral doxy for 2 more weeks from tomorrow orders placed. Completed 1 week of Keppra . Followed by neurosurgery. Sutures were removed without complication by neurosurgery on 12/3.      Acute metabolic encephalopathy At baseline, patient reportedly without cognitive impairment, was homeless and independent caring for self. At some point, during this hospitalization patient was deemed not to have capacity, due to encephalopathy and neurological deficits. Per recent physicians notes patient appears to be able to make decision for himself moving forward. Continue to monitor, capacity could fluctuates.  Psychiatry readjusted medications to reduce clonazepam  and valbenazine .  Subsequently became alert, agitated with worsening jaw tremor. Psych followed him. Continue Klonopin  to 0.5 mg 3 times daily. Cortrack removed on 12/8, patient tolerating diet.  Will discontinue one-to-one sitter for safety. Placement is challenging.  Bipolar disorder/akathisia/drug-induced parkinsonism: Patient was on Haldol  and Latuda , developed facial tremors 11/20 concern for tardive dyskinesia. EEG negative for seizures. Latuda  and Haldol  discontinued. Patient was started on propranolol  and Klonopin .  On Ingrezza  Sleeps most of the time, continue management per psych.     Dysphagia Evaluated by speech. MBS: sign of aspiration.  Alert and oriented, core track removed on 12/8, tolerating diet and meds.    Aspiration PNA;  Acute hypoxic Resp failure.  In the setting of dysphagia and aspiration   Completed 5 days of IV Unasyn    History of alcohol, tobacco use disorder: Continue thiamine , folic acid  and multivitamin Treated for alcohol withdrawal delirium with phenobarbital  taper at [facility]. Resolved   Essential hypertension: BP stable  Amlodipine  and losartan  has been discontinued. Propranolol  dose decreased to avoid hypotension   Constipation: Continue laxatives.    Bradycardia Holding parameters on for propranolol     Severe malnutrition: Continue ensure.  Started on tube feeds but now tolerating diet.   Iron deficiency anemia; continue iron supplement.       Out of bed to chair. Incentive spirometry. Nursing supportive care. Fall, aspiration precautions. Diet:  Diet Orders (From admission, onward)     Start     Ordered   09/09/24 1508  DIET DYS 3 Room service appropriate? Yes with Assist; Fluid consistency: Thin  Diet effective now       Comments: Crush meds with puree; full supervision  Question Answer Comment  Room service appropriate? Yes with Assist   Fluid consistency: Thin      09/09/24 1507           DVT prophylaxis: heparin  injection 5,000 Units Start: 08/13/24 1400 SCDs Start: 08/12/24 1150 Place TED hose Start: 08/12/24 1150 SCDs Start: 08/01/24 1745 Place and maintain sequential compression device Start: 07/29/24 1819  Level of care: Med-Surg   Code Status: Full Code  Subjective: Patient is seen and examined today morning. He is able to answer me. Sitter discontinued. Per RN he may get agitated at times.  Physical Exam: Vitals:   09/22/24 2045 09/23/24 0723 09/23/24 0945 09/23/24 1145  BP: (!) 122/96 102/71  106/87  Pulse: 67 (!)  52 65 (!) 59  Resp:  18  18  Temp:  97.7 F (36.5 C)  97.6 F (36.4 C)  TempSrc:  Oral  Oral  SpO2:  97%  97%  Weight:      Height:        General - Elderly Caucasian male, no apparent distress HEENT -  PERRLA, EOMI, atraumatic head, non tender sinuses. Lung - Clear, basal rales, rhonchi, no wheezes. Heart - S1, S2 heard, no murmurs, rubs, no pedal edema. Abdomen - Soft, non tender, bowel sounds good Neuro -alert, awake, no new focal deficits. Skin - Warm and dry.  Data Reviewed:      Latest Ref Rng & Units 09/11/2024    9:18 AM 09/08/2024    5:58 AM 09/03/2024    1:31 AM  CBC  WBC 4.0 - 10.5 K/uL 4.1  2.8  3.9   Hemoglobin 13.0 - 17.0 g/dL 87.3  88.4  88.2   Hematocrit 39.0 - 52.0 % 39.1  35.0  35.7   Platelets 150 - 400 K/uL 202  195  239       Latest Ref Rng & Units 09/18/2024    2:10 AM 09/13/2024    5:24 AM 09/11/2024    9:18 AM  BMP  Glucose 70 - 99 mg/dL 898  94  871   BUN 6 - 20 mg/dL 26  17  18    Creatinine 0.61 - 1.24 mg/dL 9.02  9.10  8.92   Sodium 135 - 145 mmol/L 142  140  143   Potassium 3.5 - 5.1 mmol/L 4.2  3.9  4.2   Chloride 98 - 111 mmol/L 107  108  109   CO2 22 - 32 mmol/L 26  27  25    Calcium 8.9 - 10.3 mg/dL 9.5  9.1  9.6    No results found.  Family Communication: no family at bedside  Disposition: Status is: Inpatient Remains inpatient appropriate because: pending placement  Planned Discharge Destination: Skilled nursing facility     Time spent: 44 minutes  Author: Concepcion Riser, MD 09/23/2024 2:30 PM Secure chat 7am to 7pm For on call review [email or web address]    "

## 2024-09-23 NOTE — Progress Notes (Signed)
 Speech Language Pathology Treatment: Cognitive-Linguistic  Patient Details Name: Ernest Romero MRN: [id number] DOB: 12-27-1965 Today's Date: 09/23/2024 Time: 8455-8440 SLP Time Calculation (min) (ACUTE ONLY): 15 min  Assessment / Plan / Recommendation Clinical Impression  Cognition: Plan was to reassess cognition today, but Ernest Romero was very anxious and ultimately asked if he could take a nap. He demonstrated adequate sustained attention in order to follow simple commands to write his name and date of admission.  He was oriented to all elements of current time, knew his room number but not the floor, knew basic information about his diagnosis but no details and had no idea how long he has been hospitalized.  He is able to name several of his medications but doesn't recall timing/dosing.    Swallowing: He drank Ensure; continues to have intermittent coughing but no respiratory complications.    SLP will return when he is feeling better and able to fully participate in new goal-setting.     HPI HPI: Ernest Romero is a 58 y.o. male who was admitted to [facility] 06/30/24 after an unresponsive episode in jail. Dx large SDH; underwent left frontoparietal craniotomy with evacuation of SDH and drain placement 9/28 (drain removed 9/29). Transferred to [facility] for management of crani/flap 10/14. Cranioplasty with bone flap replacement 10/27; worsening neuro with midline shift; underwent  left burr hole for evacuation 10/30. Bone flap removal on 11/10.  Bedside swallow eval 10/28 after change in mentaion and acute change in swallowing; NPO, then advanced to dysphagia 3/thin liquids 10/31.  New orders 11/18 due to coughing with POs - rec continued Dys3/thin liquids. Repeat orders 11/28 due to acute change again in swallow. MBS completed on 11/29 - severe oropharyngeal dysphagia with aspiration of all liquids and puree solids; rec NPO. Repeat MBS 12/4- no significant change. Cortrak placed. Ernest Romero 12/1 with evolving postoperative  changes from left sided hemicraniectomy and bone flap removal without evidence of an acute intracranial abnormality or significant residual subdural collection. MBS 12/8 rec Dys3/thin with L headturn      SLP Plan  Continue with current plan of care                Recommendations                       Frequent or constant Supervision/Assistance Cognitive communication deficit (R41.841)     Continue with current plan of care    Ernest Apt L. Vona, MA CCC/SLP Clinical Specialist - Acute Care SLP Acute Rehabilitation Services Office number [phone]  Ernest Romero  09/23/2024, 4:13 PM

## 2024-09-23 NOTE — Plan of Care (Signed)
  Problem: Education: Goal: Knowledge of General Education information will improve Description: Including pain rating scale, medication(s)/side effects and non-pharmacologic comfort measures Outcome: Progressing   Problem: Health Behavior/Discharge Planning: Goal: Ability to manage health-related needs will improve Outcome: Progressing   Problem: Clinical Measurements: Goal: Ability to maintain clinical measurements within normal limits will improve Outcome: Progressing Goal: Will remain free from infection Outcome: Progressing Goal: Diagnostic test results will improve Outcome: Progressing Goal: Respiratory complications will improve Outcome: Progressing Goal: Cardiovascular complication will be avoided Outcome: Progressing   Problem: Activity: Goal: Risk for activity intolerance will decrease Outcome: Progressing   Problem: Nutrition: Goal: Adequate nutrition will be maintained Outcome: Progressing   Problem: Coping: Goal: Level of anxiety will decrease Outcome: Progressing   Problem: Elimination: Goal: Will not experience complications related to bowel motility Outcome: Progressing Goal: Will not experience complications related to urinary retention Outcome: Progressing   Problem: Pain Managment: Goal: General experience of comfort will improve and/or be controlled Outcome: Progressing   Problem: Safety: Goal: Ability to remain free from injury will improve Outcome: Progressing   Problem: Skin Integrity: Goal: Risk for impaired skin integrity will decrease Outcome: Progressing   Problem: Education: Goal: Knowledge of the prescribed therapeutic regimen will improve Outcome: Progressing   Problem: Clinical Measurements: Goal: Usual level of consciousness will be regained or maintained. Outcome: Progressing Goal: Neurologic status will improve Outcome: Progressing Goal: Ability to maintain intracranial pressure will improve Outcome: Progressing    Problem: Skin Integrity: Goal: Demonstration of wound healing without infection will improve Outcome: Progressing

## 2024-09-23 NOTE — Plan of Care (Signed)
  Problem: Clinical Measurements: Goal: Ability to maintain clinical measurements within normal limits will improve Outcome: Progressing Goal: Will remain free from infection Outcome: Progressing   Problem: Activity: Goal: Risk for activity intolerance will decrease Outcome: Progressing   Problem: Nutrition: Goal: Adequate nutrition will be maintained Outcome: Progressing   Problem: Pain Managment: Goal: General experience of comfort will improve and/or be controlled Outcome: Progressing   Problem: Safety: Goal: Ability to remain free from injury will improve Outcome: Progressing   Problem: Skin Integrity: Goal: Risk for impaired skin integrity will decrease Outcome: Progressing

## 2024-09-23 NOTE — Progress Notes (Signed)
 Physical Therapy Treatment Patient Details Name: Ernest Romero MRN: [id number] DOB: [birth year] Today's Date: 09/23/2024   History of Present Illness 58 y.o. male presenting 07/16/24 from [facility] where he was admitted 06/30/24 for unresponsive episode in the jail. 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 On CIWA protocol. Transferred to [facility] for further management of craniotomy and flap. 10/27 s/p cranioplasty with bone flap replacement. 10/28 Change in status thought to be related to Klonopin  administration 10/28, repeat CTH shows significant increase in size of mixed attenuation subdural fluid collection underlying the L tempoparietal craniotomy site, now measuring approximately 15 mm in thickness, with associated mild mass effect and slight rightward midline shift. OR on 10/30 for SDH evacuation. [facility] 11/7 with fluid collected under bone flap. 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 Worsening dysphagia 11/29; repeat CTH stable 12/1. Cortrak placed 12/3-12/8. PMH includes alcohol use disorder, psychiatric disorder, HTN.    PT Comments  Continues to make gradual progress towards acute functional goals which have been updated and extended as appropriate due to prolonged admission and progress made thus far accounting for potential for improved safety and independence. Able to Transfer with CGA this visit, stand with CGA, and ambulate with intermittent min assist for LOB. Practiced navigating stairs, up to mod assist required to descend steps, poor carry over with sequencing instructions, min assist to ascend with single rail for support throughout. Tolerated dynamic balance activities today for NMRE training with multiple episodes of LOB but well tolerated and appropriately challenged to improve stability and safety with functional tasks. Patient will continue to benefit from skilled physical therapy services to  further improve independence with functional mobility.     If plan is discharge home, recommend the following: Assistance with cooking/housework;Direct supervision/assist for medications management;Direct supervision/assist for financial management;Assist for transportation;Help with stairs or ramp for entrance;Supervision due to cognitive status;A little help with walking and/or transfers;A little help with bathing/dressing/bathroom   Can travel by private vehicle     Yes  Equipment Recommendations  Rollator (4 wheels)    Recommendations for Other Services       Precautions / Restrictions Precautions Precautions: Fall Recall of Precautions/Restrictions: Impaired Precaution/Restrictions Comments: SBP <160, helmet (pt to wear when OOB per RN) Restrictions Weight Bearing Restrictions Per Provider Order: No     Mobility  Bed Mobility Overal bed mobility: Needs Assistance Bed Mobility: Supine to Sit, Sit to Supine     Supine to sit: Supervision Sit to supine: Supervision   General bed mobility comments: Supervision for safety, no physical assist required, Cued to donne helmet    Transfers Overall transfer level: Needs assistance Equipment used: None Transfers: Sit to/from Stand Sit to Stand: Contact guard assist           General transfer comment: CGA for safety, increased sway but self corrects. Cues for awareness.    Ambulation/Gait Ambulation/Gait assistance: Min assist Gait Distance (Feet): 250 Feet Assistive device: None Gait Pattern/deviations: Step-through pattern, Decreased stride length, Narrow base of support, Drifts right/left, Staggering right, Scissoring Gait velocity: Decreased Gait velocity interpretation: <1.8 ft/sec, indicate of risk for recurrent falls   General Gait Details: Intermittent min assist with occasional staggering. Trouble with sharp turns, scissoring intermittently, educated on awareness and techniques to avoid. No  buckling.   Stairs Stairs: Yes Stairs assistance: Mod assist Stair Management: One rail Right, Alternating pattern, Forwards Number of Stairs: 13 General stair  comments: Educated on techniques, using single rail forward, cues for step-to pattern but not following instructions. Min assist to ascend for balance, max cues to place foot further onto step with poor carry over (heels hanging off but adequate plantar flexion to clear.) Mod assist to descend step, needs help with coordinating RUE on rail and leaning forward due to posterior instability.   Wheelchair Mobility     Tilt Bed    Modified Rankin (Stroke Patients Only)       Balance Overall balance assessment: Needs assistance Sitting-balance support: No upper extremity supported, Feet supported Sitting balance-Leahy Scale: Good     Standing balance support: No upper extremity supported, During functional activity Standing balance-Leahy Scale: Fair Standing balance comment: Supervision - CGA at times. Stood over toilet to urinate with good accuracy but leaning posteriorly - able to self stabilize but CGA provided for safety due to sway.                            Communication Communication Communication: Impaired Factors Affecting Communication: Other (comment) (requires increased time for processing and word finding)  Cognition Arousal: Alert Behavior During Therapy: Flat affect   PT - Cognitive impairments: Memory, Problem solving, Safety/Judgement, Sequencing, Awareness, Attention                         Following commands: Impaired Following commands impaired: Follows multi-step commands with increased time, Follows multi-step commands inconsistently    Cueing Cueing Techniques: Verbal cues, Gestural cues  Exercises Other Exercises Other Exercises: Dynamic balance challenges performed in sets of 5-10: Forward, lateral and retro steps aiming for target with return to midline, Lateral  steps/mini lunge along targets in 10 foot intervals, figure 8s around targets. Requires intermittent min assist.    General Comments        Pertinent Vitals/Pain Pain Assessment Pain Assessment: No/denies pain    Home Living                          Prior Function            PT Goals (current goals can now be found in the care plan section) Acute Rehab PT Goals Patient Stated Goal: none stated Progress towards PT goals: Progressing toward goals;Goals updated    Frequency    Min 2X/week      PT Plan      Co-evaluation              AM-PAC PT 6 Clicks Mobility   Outcome Measure  Help needed turning from your back to your side while in a flat bed without using bedrails?: None Help needed moving from lying on your back to sitting on the side of a flat bed without using bedrails?: None Help needed moving to and from a bed to a chair (including a wheelchair)?: A Little Help needed standing up from a chair using your arms (e.g., wheelchair or bedside chair)?: A Little Help needed to walk in hospital room?: A Little Help needed climbing 3-5 steps with a railing? : A Lot 6 Click Score: 19    End of Session Equipment Utilized During Treatment: Other (comment) (helmet) Activity Tolerance: Patient tolerated treatment well Patient left: in bed;with call bell/phone within reach;with bed alarm set Nurse Communication: Mobility status PT Visit Diagnosis: Other symptoms and signs involving the nervous system (R29.898);Muscle weakness (generalized) (M62.81);Unsteadiness on feet (R26.81);Other  abnormalities of gait and mobility (R26.89)     Time: 8895-8872 PT Time Calculation (min) (ACUTE ONLY): 23 min  Charges:    $Gait Training: 8-22 mins $Neuromuscular Re-education: 8-22 mins PT General Charges $$ ACUTE PT VISIT: 1 Visit                     Leontine Roads, PT, DPT [facility]  Rehabilitation Services Physical Therapist Office:  [phone] Website: [email or web address]    Leontine GORMAN Roads 09/23/2024, 11:52 AM

## 2024-09-24 MED ORDER — LORAZEPAM 1 MG PO TABS
1.0000 mg | ORAL_TABLET | Freq: Four times a day (QID) | ORAL | Status: AC | PRN
  Administered 2024-09-24 – 2024-10-25 (×54): 1 mg via ORAL
  Filled 2024-09-24 (×36): qty 1
  Filled 2024-09-24: qty 2
  Filled 2024-09-24 (×5): qty 1

## 2024-09-24 NOTE — Progress Notes (Addendum)
 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 Probably at least 3 months after abx treatment is recommended but will depend on complication that could occurs without flap.  12/1 CT head showed evolving postop changes from left-sided hemicraniectomy and bone flap removal without evidence of acute intracranial abnormality or significant residual subdural collection. -Seen by neurosurgery on 12/3 all sutures removed. Recommendations: -D/C vancomycin  -start doxycyline x 2 weeks to complete total of 8 weeks of abx - CBC on 12/10, BMP on 12/17 stable - Please get CBC/CMP prior to discharge. -F.U with Dr. Overton on 1/20 -Standard precautions -Communicated to primary   Microbiology:   Antibiotics: Vanco Cultures: Blood  Urine  Other   SUBJECTIVE: Resting in bed.  Interval: Afebrile overnight  Review of Systems: ROS   Scheduled Meds:  benztropine   1 mg Oral BID   Chlorhexidine  Gluconate Cloth  6 each Topical Daily   clonazePAM   0.5 mg Oral TID   docusate  100 mg Oral Daily   doxycycline   100 mg Oral Q12H   feeding supplement  237 mL Oral BID BM   ferrous  sulfate  325 mg Oral Q breakfast   folic acid   1 mg Oral Daily   gabapentin   200 mg Oral TID   heparin  injection (subcutaneous)  5,000 Units Subcutaneous Q8H   mirtazapine   15 mg Oral QHS   multivitamin with minerals  1 tablet Oral Daily   nicotine   14 mg Transdermal Daily   pantoprazole   40 mg Oral QHS   polyethylene glycol  17 g Oral Daily   propranolol   20 mg Oral BID   senna  2 tablet Oral Daily   sodium chloride  flush  10-40 mL Intracatheter Q12H   thiamine   100 mg Oral Daily   valbenazine   40 mg Oral Daily   Continuous Infusions: PRN Meds:.acetaminophen  **OR** [PENDING] acetaminophen  **OR** acetaminophen  **OR** [PENDING] acetaminophen , docusate sodium , guaiFENesin -dextromethorphan , labetalol , LORazepam , magic mouthwash w/lidocaine , melatonin, ondansetron  **OR** ondansetron  (ZOFRAN ) IV, mouth rinse, phenol, promethazine , sodium chloride  flush Allergies[1]  OBJECTIVE: Vitals:   09/23/24 2007 09/23/24 2008 09/23/24 2341 09/24/24 0500  BP: 112/73 112/73    Pulse: 78 78    Resp: 18  20   Temp: 98.6 F (37 C)     TempSrc: Oral     SpO2: 96%     Weight:    69.8 kg  Height:       Body mass index is 22.08 kg/m.  Physical Exam Constitutional:      General: He is not in acute distress.    Appearance: He is normal weight. He is not toxic-appearing.  HENT:     Head: Normocephalic.  Right Ear: External ear normal.     Left Ear: External ear normal.     Nose: No congestion or rhinorrhea.     Mouth/Throat:     Mouth: Mucous membranes are moist.     Pharynx: Oropharynx is clear.  Eyes:     Extraocular Movements: Extraocular movements intact.     Conjunctiva/sclera: Conjunctivae normal.     Pupils: Pupils are equal, round, and reactive to light.  Cardiovascular:     Rate and Rhythm: Normal rate and regular rhythm.     Heart sounds: No murmur heard.    No friction rub. No gallop.  Pulmonary:     Effort: Pulmonary effort is normal.     Breath sounds: Normal breath  sounds.  Abdominal:     General: Abdomen is flat. Bowel sounds are normal.     Palpations: Abdomen is soft.  Musculoskeletal:     Cervical back: Normal range of motion and neck supple.  Skin:    General: Skin is warm and dry.  Neurological:     General: No focal deficit present.     Mental Status: He is oriented to person, place, and time.  Psychiatric:        Mood and Affect: Mood normal.       Lab Results Lab Results  Component Value Date   WBC 4.1 09/11/2024   HGB 12.6 (L) 09/11/2024   HCT 39.1 09/11/2024   MCV 91.1 09/11/2024   PLT 202 09/11/2024    Lab Results  Component Value Date   CREATININE 0.97 09/18/2024   BUN 26 (H) 09/18/2024   NA 142 09/18/2024   K 4.2 09/18/2024   CL 107 09/18/2024   CO2 26 09/18/2024    Lab Results  Component Value Date   ALT 40 08/28/2024   AST 26 08/28/2024   ALKPHOS 69 08/28/2024   BILITOT 0.7 08/28/2024        Loney Stank, MD [facility] 09/24/2024, 6:34 AM Evaluation of this patient requires complex antimicrobial therapy evaluation and counseling + isolation needs for disease transmission risk assessment and mitigation      [1] No Known Allergies  "

## 2024-09-24 NOTE — Progress Notes (Signed)
 Occupational Therapy Treatment Patient Details Name: Ernest Romero MRN: [id number] DOB: 06/24/1966 Today's Date: 09/24/2024   History of present illness 58 y.o. male presenting 07/16/24 from [facility] where he was admitted 06/30/[facility]. 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 On CIWA protocol. Transferred to [facility] for further management of craniotomy and flap. 10/27 s/p cranioplasty with bone flap replacement. 10/28 Change in status thought to be related to Klonopin  administration 10/28, repeat CTH shows significant increase in size of mixed attenuation subdural fluid collection underlying the L tempoparietal craniotomy site, now measuring approximately 15 mm in thickness, with associated mild mass effect and slight rightward midline shift. OR on 10/30 for SDH evacuation. [facility] 11/7 with fluid collected under bone flap. 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 Worsening dysphagia 11/29; repeat CTH stable 12/1. Cortrak placed 12/3-12/8. PMH includes alcohol use disorder, psychiatric disorder, HTN.   OT comments  Pt continues to make progress with functional goals. Pt attempting to get OOB without calling for staff assist, bed alarm sounding. Pt stated that he was going to the bathrooom, OT cued pt to don helmet and pt compliant. Pt Sup to complete sup-sit, STS. Pt walked to bathroom and transferred to/from commode and shower with Sup, simulated LB bathing tasks CGA, stood at sink for grooming/hygiene tasks Sup. Pt sat in chair and participated in jaw exercises introduced in last OT session to improve jaw tremor and strengthening. Chair alarm in place at end of session. OT wil continue to follow acutely to maximize level of function and safety       If plan is discharge home, recommend the following:  Supervision due to cognitive status;Direct supervision/assist for medications  management;Assistance with cooking/housework;Direct supervision/assist for financial management;Assist for transportation;Assistance with feeding;A little help with walking and/or transfers;Help with stairs or ramp for entrance;A little help with bathing/dressing/bathroom   Equipment Recommendations  BSC/3in1    Recommendations for Other Services      Precautions / Restrictions Precautions Precautions: Fall Recall of Precautions/Restrictions: Impaired Precaution/Restrictions Comments: SBP <160, helmet (pt to wear when OOB per RN) Restrictions Weight Bearing Restrictions Per Provider Order: No       Mobility Bed Mobility Overal bed mobility: Needs Assistance Bed Mobility: Supine to Sit, Sit to Supine     Supine to sit: Supervision Sit to supine: Supervision        Transfers Overall transfer level: Needs assistance Equipment used: None Transfers: Sit to/from Stand Sit to Stand: Supervision           General transfer comment: pt attempting to get OOB without calling for staff assist, bed alarm sounding. Pt stated that he was going to the bathrooom, OT cued pt to don helmet and pt compliant     Balance Overall balance assessment: Needs assistance Sitting-balance support: No upper extremity supported, Feet supported Sitting balance-Leahy Scale: Good     Standing balance support: No upper extremity supported, During functional activity Standing balance-Leahy Scale: Fair                             ADL either performed or assessed with clinical judgement   ADL Overall ADL's : Needs assistance/impaired     Grooming: Wash/dry hands;Wash/dry face;Supervision/safety;Standing       Lower Body Bathing: Contact guard assist;Sit to/from stand   Upper Body Dressing : Supervision/safety;Standing  Lower Body Dressing: Contact guard assist;Sit to/from stand   Toilet Transfer: Supervision/safety;Ambulation;Regular Toilet;Grab bars Toilet Transfer Details  (indicate cue type and reason): cues to use grab bars for safety Toileting- Clothing Manipulation and Hygiene: Supervision/safety;Sit to/from stand       Functional mobility during ADLs: Supervision/safety;Cueing for safety      Extremity/Trunk Assessment Upper Extremity Assessment Upper Extremity Assessment: Generalized weakness RUE Deficits / Details: impaired coordination LUE Deficits / Details: impaired coordination            Vision Ability to See in Adequate Light: 0 Adequate Patient Visual Report: No change from baseline;Other (comment) (reports not blurred or impaired vision)     Perception     Praxis     Communication Communication Communication: Impaired Factors Affecting Communication: Other (comment) (increased time for processing and word finding)   Cognition Arousal: Alert Behavior During Therapy: Flat affect               OT - Cognition Comments: Pt AAOx4 and pleasant throughout session. Pt requiring increased time for processing, word finding, and motor planning.                 Following commands: Impaired Following commands impaired: Follows multi-step commands with increased time, Follows multi-step commands inconsistently      Cueing   Cueing Techniques: Verbal cues, Gestural cues  Exercises Other Exercises Other Exercises: pt tolerated dynamic standing balance activity reaching for items at sink above and below counter and at closet, no LOB with Sup for safety Other Exercises: Side-to-side jaw movement; 10 reps; sitting; to address jaw tremor and strengthening Other Exercises: Jaw protraction; 10 reps; sitting; to address jaw tremor and strengthening    Shoulder Instructions       General Comments Reviewed safety and precautions with fall risk.    Pertinent Vitals/ Pain       Pain Assessment Pain Assessment: Faces Faces Pain Scale: Hurts a little bit Pain Location: mouth/jaw Pain Descriptors / Indicators: Discomfort Pain  Intervention(s): Monitored during session  Home Living                                          Prior Functioning/Environment              Frequency  Min 2X/week        Progress Toward Goals  OT Goals(current goals can now be found in the care plan section)  Progress towards OT goals: Progressing toward goals     Plan      Co-evaluation                 AM-PAC OT 6 Clicks Daily Activity     Outcome Measure   Help from another person eating meals?: None Help from another person taking care of personal grooming?: A Little Help from another person toileting, which includes using toliet, bedpan, or urinal?: A Little Help from another person bathing (including washing, rinsing, drying)?: A Little Help from another person to put on and taking off regular upper body clothing?: A Little Help from another person to put on and taking off regular lower body clothing?: A Little 6 Click Score: 19    End of Session Equipment Utilized During Treatment: Other (comment) (Helmet)  OT Visit Diagnosis: Other abnormalities of gait and mobility (R26.89);Muscle weakness (generalized) (M62.81);Other symptoms and signs involving the nervous system (R29.898);Cognitive communication deficit (  R41.841);Other symptoms and signs involving cognitive function   Activity Tolerance Patient tolerated treatment well   Patient Left with call bell/phone within reach;with chair alarm set;in chair   Nurse Communication Mobility status        Time: 1047-1110 OT Time Calculation (min): 23 min  Charges: OT General Charges $OT Visit: 1 Visit OT Treatments $Self Care/Home Management : 8-22 mins $Therapeutic Activity: 8-22 mins    Jacques Karna Loose 09/24/2024, 1:32 PM

## 2024-09-24 NOTE — Progress Notes (Signed)
 " Progress Note   Patient: Ernest Romero FMW:[id number] DOB: 1966/07/13 DOA: 07/15/2024     71 DOS: the patient was seen and examined on 09/24/2024   Brief hospital course:   Assessment and Plan: Subdural hematoma /  Status post initial craniotomy 9/28 (Dr. 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 Evaluated by infectious disease who recommends 6 weeks of IV vancomycin , which he will finish today 12/22. Discussed with ID, oral doxy for 2 more weeks from tomorrow orders placed. Completed 1 week of Keppra . Followed by neurosurgery. Sutures were removed without complication by neurosurgery on 12/3.      Acute metabolic encephalopathy At baseline, patient reportedly without cognitive impairment, was homeless and independent caring for self. At some point, during this hospitalization patient was deemed not to have capacity, due to encephalopathy and neurological deficits. Per recent physicians notes patient appears to be able to make decision for himself moving forward. Continue to monitor, capacity could fluctuates.  Psychiatry readjusted medications to reduce clonazepam  and valbenazine .  Subsequently became alert, agitated with worsening jaw tremor. Psych followed him. Continue Klonopin  to 0.5 mg 3 times daily. Oral ativan  as needed ordered. Stop IV ativan . Cortrack removed on 12/8, patient tolerating diet.  Currently no one-to-one sitter for safety. Placement is challenging.  Bipolar disorder/akathisia/drug-induced parkinsonism: Patient was on Haldol  and Latuda , developed facial tremors 11/20 concern for tardive dyskinesia. EEG negative for seizures. Latuda  and Haldol  discontinued. Patient was started on propranolol  and Klonopin .  On Ingrezza  Sleeps most of the time, continue management per psych.     Dysphagia Evaluated by speech. MBS: sign of aspiration.  Alert and oriented, core track removed  on 12/8, tolerating diet and meds.   Aspiration PNA;  Acute hypoxic Resp failure.  In the setting of dysphagia and aspiration   Completed 5 days of IV Unasyn    History of alcohol, tobacco use disorder: Continue thiamine , folic acid  and multivitamin Treated for alcohol withdrawal delirium with phenobarbital  taper at [facility]. Resolved   Essential hypertension: BP stable  Amlodipine  and losartan  has been discontinued. Propranolol  dose decreased to avoid hypotension   Constipation: Continue laxatives.    Bradycardia Holding parameters on for propranolol     Severe malnutrition: Continue ensure.  Started on tube feeds but now tolerating diet.   Iron deficiency anemia; continue iron supplement.       Out of bed to chair. Incentive spirometry. Nursing supportive care. Fall, aspiration precautions. Diet:  Diet Orders (From admission, onward)     Start     Ordered   09/09/24 1508  DIET DYS 3 Room service appropriate? Yes with Assist; Fluid consistency: Thin  Diet effective now       Comments: Crush meds with puree; full supervision  Question Answer Comment  Room service appropriate? Yes with Assist   Fluid consistency: Thin      09/09/24 1507           DVT prophylaxis: heparin  injection 5,000 Units Start: 08/13/24 1400 SCDs Start: 08/12/24 1150 Place TED hose Start: 08/12/24 1150 SCDs Start: 08/01/24 1745 Place and maintain sequential compression device Start: 07/29/24 1819  Level of care: Med-Surg   Code Status: Full Code  Subjective: Patient is seen and examined today morning. He is able to answer me. Per RN he asked for ativan  due to anxiety, jaw movements. Oral ativan  suggested.  Physical Exam: Vitals:   09/23/24 2341 09/24/24 0500 09/24/24 0801  09/24/24 1209  BP:   125/81 129/86  Pulse:   71 71  Resp: 20  18 17   Temp:   97.7 F (36.5 C) 98.9 F (37.2 C)  TempSrc:    Oral  SpO2:   96% 96%  Weight:  69.8 kg    Height:        General - Elderly  Caucasian male, no apparent distress HEENT - PERRLA, EOMI, atraumatic head, non tender sinuses. Lung - Clear, basal rales, rhonchi, no wheezes. Heart - S1, S2 heard, no murmurs, rubs, no pedal edema. Abdomen - Soft, non tender, bowel sounds good Neuro -alert, awake, no new focal deficits. Lower jaw movements noted. Skin - Warm and dry.  Data Reviewed:      Latest Ref Rng & Units 09/11/2024    9:18 AM 09/08/2024    5:58 AM 09/03/2024    1:31 AM  CBC  WBC 4.0 - 10.5 K/uL 4.1  2.8  3.9   Hemoglobin 13.0 - 17.0 g/dL 87.3  88.4  88.2   Hematocrit 39.0 - 52.0 % 39.1  35.0  35.7   Platelets 150 - 400 K/uL 202  195  239       Latest Ref Rng & Units 09/18/2024    2:10 AM 09/13/2024    5:24 AM 09/11/2024    9:18 AM  BMP  Glucose 70 - 99 mg/dL 898  94  871   BUN 6 - 20 mg/dL 26  17  18    Creatinine 0.61 - 1.24 mg/dL 9.02  9.10  8.92   Sodium 135 - 145 mmol/L 142  140  143   Potassium 3.5 - 5.1 mmol/L 4.2  3.9  4.2   Chloride 98 - 111 mmol/L 107  108  109   CO2 22 - 32 mmol/L 26  27  25    Calcium 8.9 - 10.3 mg/dL 9.5  9.1  9.6    No results found.  Family Communication: no family at bedside  Disposition: Status is: Inpatient Remains inpatient appropriate because: pending placement  Planned Discharge Destination: Skilled nursing facility     Time spent: 45 minutes  Author: Concepcion Riser, MD 09/24/2024 2:47 PM Secure chat 7am to 7pm For on call review [email or web address]    "

## 2024-09-24 NOTE — Progress Notes (Signed)
 Physical Therapy Treatment Patient Details Name: Ernest Romero MRN: [id number] DOB: 1966-03-18 Today's Date: 09/24/2024   History of Present Illness 58 y.o. male presenting 07/16/24 from [facility] where he was admitted 06/30/24 for [facility] episode in the jail. 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 On CIWA protocol. Transferred to [facility] for further management of craniotomy and flap. 10/27 s/p cranioplasty with bone flap replacement. 10/28 Change in status thought to be related to Klonopin  administration 10/28, repeat CTH shows significant increase in size of mixed attenuation subdural fluid collection underlying the L tempoparietal craniotomy site, now measuring approximately 15 mm in thickness, with associated mild mass effect and slight rightward midline shift. OR on 10/30 for SDH evacuation. [facility] 11/7 with fluid collected under bone flap. 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 Worsening dysphagia 11/29; repeat CTH stable 12/1. Cortrak placed 12/3-12/8. PMH includes alcohol use disorder, psychiatric disorder, HTN.    PT Comments  Progressing towards acute functional goals. Able to ambulate with CGA while using rollator for support, grossly stable today. Short distance in room (supervised rushing to rest room when PT entered room) did not acknowledge chair alarm warnings - pt showed increased sway and furniture surfing. Further education on safety and fall risk. Reviewed LE exercises. Pt fatigued today with ambulation declining further training at this time. Patient will continue to benefit from skilled physical therapy services to further improve independence with functional mobility.     If plan is discharge home, recommend the following: Assistance with cooking/housework;Direct supervision/assist for medications management;Direct supervision/assist for financial management;Assist for  transportation;Help with stairs or ramp for entrance;Supervision due to cognitive status;A little help with walking and/or transfers;A little help with bathing/dressing/bathroom   Can travel by private vehicle     Yes  Equipment Recommendations  Rollator (4 wheels)    Recommendations for Other Services       Precautions / Restrictions Precautions Precautions: Fall Recall of Precautions/Restrictions: Impaired Precaution/Restrictions Comments: SBP <160, helmet (pt to wear when OOB per RN) Restrictions Weight Bearing Restrictions Per Provider Order: No     Mobility  Bed Mobility               General bed mobility comments: in recliner.    Transfers Overall transfer level: Needs assistance Equipment used: None Transfers: Sit to/from Stand Sit to Stand: Supervision           General transfer comment: Stood up from recliner as soon as PT entered room, ignoring chair alarm and warnings. Pt placed helmet on but rushed to restroom due to urgency (shows increased sway and furniture surfing.) Required education for safety and to call for assist due to fall risk. Supervision for safety with transfers from toilet. Able to perform self care on toilet.    Ambulation/Gait Ambulation/Gait assistance: Contact guard assist Gait Distance (Feet): 175 Feet Assistive device: Rollator (4 wheels) Gait Pattern/deviations: Step-through pattern, Decreased stride length, Narrow base of support Gait velocity: Decreased Gait velocity interpretation: 1.31 - 2.62 ft/sec, indicative of limited community ambulator   General Gait Details: Grossly stable with rollator use today. Cues for direction and proximity to device. Fair control and navigation through hallways. Needed CGA for safety, especially backing up with device and cues for alignment prior to sitting back down.   Stairs             Wheelchair Mobility     Tilt Bed  Modified Rankin (Stroke Patients Only)        Balance Overall balance assessment: Needs assistance Sitting-balance support: No upper extremity supported, Feet supported Sitting balance-Leahy Scale: Good     Standing balance support: No upper extremity supported, During functional activity Standing balance-Leahy Scale: Fair Standing balance comment: supervision standing.                            Communication Communication Communication: Impaired Factors Affecting Communication: Other (comment) (requires increased time for processing and word finding)  Cognition Arousal: Alert Behavior During Therapy: Flat affect   PT - Cognitive impairments: Memory, Problem solving, Safety/Judgement, Sequencing, Awareness, Attention                   Rancho Levels of Cognitive Functioning Rancho Los Amigos Scales of Cognitive Functioning: Automatic, Appropriate: Minimal Assistance for Daily Living Skills Rancho Los Amigos Scales of Cognitive Functioning: Automatic, Appropriate: Minimal Assistance for Daily Living Skills [VII]   Following commands: Impaired Following commands impaired: Follows multi-step commands with increased time, Follows multi-step commands inconsistently    Cueing Cueing Techniques: Verbal cues, Gestural cues  Exercises General Exercises - Lower Extremity Ankle Circles/Pumps: AROM, Both, 10 reps, Seated Long Arc Quad: Strengthening, Both, 10 reps, Seated    General Comments General comments (skin integrity, edema, etc.): Reviewed safety and precautions with fall risk.      Pertinent Vitals/Pain Pain Assessment Pain Assessment: No/denies pain    Home Living                          Prior Function            PT Goals (current goals can now be found in the care plan section) Acute Rehab PT Goals Patient Stated Goal: none stated Progress towards PT goals: Progressing toward goals    Frequency    Min 2X/week      PT Plan      Co-evaluation              AM-PAC  PT 6 Clicks Mobility   Outcome Measure  Help needed turning from your back to your side while in a flat bed without using bedrails?: None Help needed moving from lying on your back to sitting on the side of a flat bed without using bedrails?: None Help needed moving to and from a bed to a chair (including a wheelchair)?: A Little Help needed standing up from a chair using your arms (e.g., wheelchair or bedside chair)?: A Little Help needed to walk in hospital room?: A Little Help needed climbing 3-5 steps with a railing? : A Lot 6 Click Score: 19    End of Session Equipment Utilized During Treatment: Other (comment) (helmet) Activity Tolerance: Patient tolerated treatment well Patient left: with call bell/phone within reach;in chair;with chair alarm set Nurse Communication: Mobility status PT Visit Diagnosis: Other symptoms and signs involving the nervous system (R29.898);Muscle weakness (generalized) (M62.81);Unsteadiness on feet (R26.81);Other abnormalities of gait and mobility (R26.89)     Time: 8871-8858 PT Time Calculation (min) (ACUTE ONLY): 13 min  Charges:    $Gait Training: 8-22 mins PT General Charges $$ ACUTE PT VISIT: 1 Visit                     Leontine Roads, PT, DPT [facility]  Rehabilitation Services Physical Therapist Office: [phone] Website: [email or web address]    Leontine GORMAN Roads 09/24/2024,  12:56 PM

## 2024-09-24 NOTE — Plan of Care (Signed)
  Problem: Education: Goal: Knowledge of General Education information will improve Description: Including pain rating scale, medication(s)/side effects and non-pharmacologic comfort measures Outcome: Progressing   Problem: Health Behavior/Discharge Planning: Goal: Ability to manage health-related needs will improve Outcome: Progressing   Problem: Clinical Measurements: Goal: Ability to maintain clinical measurements within normal limits will improve Outcome: Progressing Goal: Will remain free from infection Outcome: Progressing Goal: Diagnostic test results will improve Outcome: Progressing Goal: Respiratory complications will improve Outcome: Progressing Goal: Cardiovascular complication will be avoided Outcome: Progressing   Problem: Activity: Goal: Risk for activity intolerance will decrease Outcome: Progressing   Problem: Nutrition: Goal: Adequate nutrition will be maintained Outcome: Progressing   Problem: Coping: Goal: Level of anxiety will decrease Outcome: Progressing   Problem: Elimination: Goal: Will not experience complications related to bowel motility Outcome: Progressing Goal: Will not experience complications related to urinary retention Outcome: Progressing   Problem: Pain Managment: Goal: General experience of comfort will improve and/or be controlled Outcome: Progressing   Problem: Safety: Goal: Ability to remain free from injury will improve Outcome: Progressing   Problem: Skin Integrity: Goal: Risk for impaired skin integrity will decrease Outcome: Progressing   Problem: Education: Goal: Knowledge of the prescribed therapeutic regimen will improve Outcome: Progressing   Problem: Clinical Measurements: Goal: Usual level of consciousness will be regained or maintained. Outcome: Progressing Goal: Neurologic status will improve Outcome: Progressing Goal: Ability to maintain intracranial pressure will improve Outcome: Progressing    Problem: Skin Integrity: Goal: Demonstration of wound healing without infection will improve Outcome: Progressing

## 2024-09-25 LAB — GLUCOSE, CAPILLARY
Glucose-Capillary: 105 mg/dL — ABNORMAL HIGH (ref 70–99)
Glucose-Capillary: 106 mg/dL — ABNORMAL HIGH (ref 70–99)
Glucose-Capillary: 107 mg/dL — ABNORMAL HIGH (ref 70–99)
Glucose-Capillary: 110 mg/dL — ABNORMAL HIGH (ref 70–99)

## 2024-09-25 NOTE — Progress Notes (Signed)
 " Progress Note   Patient: Ernest Romero FMW:[id number] DOB: [birth year] DOA: 07/15/2024     72 DOS: the patient was seen and examined on 09/25/2024   Brief hospital course:   Assessment and Plan: Subdural hematoma /  Status post initial craniotomy 9/28 (Dr. 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 Evaluated by infectious disease who recommends 6 weeks of IV vancomycin , which he will finish today 12/22. Discussed with ID, oral doxy for 2 more weeks from tomorrow orders placed. Completed 1 week of Keppra . Followed by neurosurgery. Sutures were removed without complication by neurosurgery on 12/3.      Acute metabolic encephalopathy At baseline, patient reportedly without cognitive impairment, was homeless and independent caring for self. At some point, during this hospitalization patient was deemed not to have capacity, due to encephalopathy and neurological deficits. Per recent physicians notes patient appears to be able to make decision for himself moving forward. Continue to monitor, capacity could fluctuates.  Psychiatry readjusted medications to reduce clonazepam  and valbenazine .  Subsequently became alert, agitated with worsening jaw tremor. Psych followed him. Continue Klonopin  to 0.5 mg 3 times daily. Oral ativan  as needed ordered. Stop IV ativan . Cortrack removed on 12/8, patient tolerating diet.  Currently off one-to-one sitter for safety. Placement is challenging.  Bipolar disorder/akathisia/drug-induced parkinsonism: Patient was on Haldol  and Latuda , developed facial tremors 11/20 concern for tardive dyskinesia. EEG negative for seizures. Latuda  and Haldol  discontinued. Patient was started on propranolol  and Klonopin .  On Ingrezza  Sleeps most of the time, continue management per psych.     Dysphagia Evaluated by speech. MBS: sign of aspiration.  Alert and oriented, core track removed  on 12/8, tolerating diet and meds.   Aspiration PNA;  Acute hypoxic Resp failure.  In the setting of dysphagia and aspiration   Completed 5 days of IV Unasyn    History of alcohol, tobacco use disorder: Continue thiamine , folic acid  and multivitamin Treated for alcohol withdrawal delirium with phenobarbital  taper at [facility]. Resolved.   Essential hypertension: BP stable.  Amlodipine  and losartan  has been discontinued. Propranolol  dose decreased to avoid hypotension   Constipation: Continue senna, colace, laxatives.    Bradycardia Holding parameters on for propranolol     Severe malnutrition: Continue ensure.  Tolerating diet well.   Iron deficiency anemia; continue iron supplement.       Out of bed to chair. Incentive spirometry. Nursing supportive care. Fall, aspiration precautions. Diet:  Diet Orders (From admission, onward)     Start     Ordered   09/09/24 1508  DIET DYS 3 Room service appropriate? Yes with Assist; Fluid consistency: Thin  Diet effective now       Comments: Crush meds with puree; full supervision  Question Answer Comment  Room service appropriate? Yes with Assist   Fluid consistency: Thin      09/09/24 1507           DVT prophylaxis: heparin  injection 5,000 Units Start: 08/13/24 1400 SCDs Start: 08/12/24 1150 Place TED hose Start: 08/12/24 1150 SCDs Start: 08/01/24 1745 Place and maintain sequential compression device Start: 07/29/24 1819  Level of care: Med-Surg   Code Status: Full Code  Subjective: Patient is seen and examined today morning. He is able to answer me. No overnight issues.  Physical Exam: Vitals:   09/25/24 0424 09/25/24 0807 09/25/24 0831 09/25/24 1142  BP: (!) 106/91 103/72  110/72  Pulse: 60 (!) 56 ROLLEN)  56 67  Resp: 20     Temp: (!) 97.5 F (36.4 C) (!) 97.4 F (36.3 C)  (!) 97.5 F (36.4 C)  TempSrc: Oral Oral    SpO2: 94% 96%  96%  Weight:      Height:        General - Elderly Caucasian male, no apparent  distress HEENT - PERRLA, EOMI, atraumatic head, non tender sinuses. Lung - Clear, basal rales, rhonchi, no wheezes. Heart - S1, S2 heard, no murmurs, rubs, no pedal edema. Abdomen - Soft, non tender, bowel sounds good Neuro -alert, awake, no new focal deficits. Skin - Warm and dry.  Data Reviewed:      Latest Ref Rng & Units 09/11/2024    9:18 AM 09/08/2024    5:58 AM 09/03/2024    1:31 AM  CBC  WBC 4.0 - 10.5 K/uL 4.1  2.8  3.9   Hemoglobin 13.0 - 17.0 g/dL 87.3  88.4  88.2   Hematocrit 39.0 - 52.0 % 39.1  35.0  35.7   Platelets 150 - 400 K/uL 202  195  239       Latest Ref Rng & Units 09/18/2024    2:10 AM 09/13/2024    5:24 AM 09/11/2024    9:18 AM  BMP  Glucose 70 - 99 mg/dL 898  94  871   BUN 6 - 20 mg/dL 26  17  18    Creatinine 0.61 - 1.24 mg/dL 9.02  9.10  8.92   Sodium 135 - 145 mmol/L 142  140  143   Potassium 3.5 - 5.1 mmol/L 4.2  3.9  4.2   Chloride 98 - 111 mmol/L 107  108  109   CO2 22 - 32 mmol/L 26  27  25    Calcium 8.9 - 10.3 mg/dL 9.5  9.1  9.6    No results found.  Family Communication: no family at bedside  Disposition: Status is: Inpatient Remains inpatient appropriate because: pending placement  Planned Discharge Destination: Skilled nursing facility     Time spent: 42 minutes  Author: Concepcion Riser, MD 09/25/2024 2:05 PM Secure chat 7am to 7pm For on call review [email or web address]    "

## 2024-09-25 NOTE — Plan of Care (Signed)
  Problem: Education: Goal: Knowledge of General Education information will improve Description: Including pain rating scale, medication(s)/side effects and non-pharmacologic comfort measures Outcome: Progressing   Problem: Health Behavior/Discharge Planning: Goal: Ability to manage health-related needs will improve Outcome: Progressing   Problem: Clinical Measurements: Goal: Ability to maintain clinical measurements within normal limits will improve Outcome: Progressing Goal: Will remain free from infection Outcome: Progressing Goal: Diagnostic test results will improve Outcome: Progressing Goal: Respiratory complications will improve Outcome: Progressing Goal: Cardiovascular complication will be avoided Outcome: Progressing   Problem: Activity: Goal: Risk for activity intolerance will decrease Outcome: Progressing   Problem: Nutrition: Goal: Adequate nutrition will be maintained Outcome: Progressing   Problem: Coping: Goal: Level of anxiety will decrease Outcome: Progressing   Problem: Elimination: Goal: Will not experience complications related to bowel motility Outcome: Progressing Goal: Will not experience complications related to urinary retention Outcome: Progressing   Problem: Pain Managment: Goal: General experience of comfort will improve and/or be controlled Outcome: Progressing   Problem: Safety: Goal: Ability to remain free from injury will improve Outcome: Progressing   Problem: Skin Integrity: Goal: Risk for impaired skin integrity will decrease Outcome: Progressing   Problem: Education: Goal: Knowledge of the prescribed therapeutic regimen will improve Outcome: Progressing   Problem: Clinical Measurements: Goal: Usual level of consciousness will be regained or maintained. Outcome: Progressing Goal: Neurologic status will improve Outcome: Progressing Goal: Ability to maintain intracranial pressure will improve Outcome: Progressing    Problem: Skin Integrity: Goal: Demonstration of wound healing without infection will improve Outcome: Progressing

## 2024-09-25 NOTE — TOC Progression Note (Signed)
 Transition of Care [facility]) - Progression Note    Patient Details  Name: Ernest Romero MRN: [id number] Date of Birth: [birth year]  Transition of [facility] CM/SW Contact  Lauraine FORBES Saa, LCSWA Phone Number: 09/25/2024, 3:39 PM  Clinical Narrative:     3:39 PM Per chart review, patient does not currently have any SNF bed offers. CSW expanded SNF search in efforts to obtain SNF bed offer. TOC will continue to follow.  Expected Discharge Plan: Skilled Nursing Facility Barriers to Discharge: Continued Medical Work up, English As A Second Language Teacher, SNF Pending bed offer               Expected Discharge Plan and Services In-house Referral: Clinical Social Work Discharge Planning Services: CM Consult                                           Social Drivers of Health (SDOH) Interventions SDOH Screenings   Food Insecurity: Patient Unable To Answer (07/16/2024)  Housing: Unknown (07/16/2024)  Transportation Needs: Patient Unable To Answer (07/16/2024)  Utilities: Patient Unable To Answer (07/16/2024)  Alcohol Screen: Low Risk (08/30/2022)  Depression (PHQ2-9): Medium Risk (08/30/2022)  Tobacco Use: Medium Risk (08/12/2024)    Readmission Risk Interventions    07/16/2024   12:23 PM  Readmission Risk Prevention Plan  Transportation Screening Complete  Medication Review (RN Care Manager) Complete  PCP or Specialist appointment within 3-5 days of discharge Complete  HRI or Home Care Consult Complete  SW Recovery Care/Counseling Consult Complete  Palliative Care Screening Not Applicable  Skilled Nursing Facility Not Applicable

## 2024-09-26 NOTE — Progress Notes (Signed)
 " Progress Note   Patient: Ernest Romero FMW:[id number] DOB: August 16, 1966 DOA: 07/15/2024     73 DOS: the patient was seen and examined on 09/26/2024   Brief hospital course:   Assessment and Plan: Subdural hematoma /  Status post initial craniotomy 9/28 (Dr. 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 Evaluated by infectious disease who recommends 6 weeks of IV vancomycin , which he will finish today 12/22. Discussed with ID, oral doxy for 2 more weeks from tomorrow orders placed. Completed 1 week of Keppra . Followed by neurosurgery. Sutures were removed without complication by neurosurgery on 12/3.  Mental status better, able to answer appropriately.    Acute metabolic encephalopathy At baseline, patient reportedly without cognitive impairment, was homeless and independent caring for self. At some point, during this hospitalization patient was deemed not to have capacity, due to encephalopathy and neurological deficits. Per recent physicians notes patient appears to be able to make decision for himself moving forward. Continue to monitor, capacity could fluctuate.  Psychiatry readjusted medications to reduce clonazepam  and valbenazine .  Subsequently became alert, agitated with worsening jaw tremor. Continue Klonopin  to 0.5 mg 3 times daily. Oral ativan  as needed ordered. Stop IV ativan . Cortrack removed on 12/8, patient tolerating diet.   Currently off one-to-one sitter for safety. Placement is challenging.  Bipolar disorder/akathisia/drug-induced parkinsonism: Patient was on Haldol  and Latuda , developed facial tremors 11/20 concern for tardive dyskinesia. EEG negative for seizures. Latuda  and Haldol  discontinued. Patient was started on propranolol  and Klonopin .  On Ingrezza .  Sleeps most of the time, continue management per psych.     Dysphagia Evaluated by speech. MBS: signs of aspiration.   Alert and oriented, core track removed on 12/8, tolerating diet and meds.   Aspiration PNA;  Acute hypoxic Resp failure.  In the setting of dysphagia and aspiration   Completed 5 days of IV Unasyn    History of alcohol, tobacco use disorder Alcohol withdrawal: resolved. Continue thiamine , folic acid  and multivitamin Treated for alcohol withdrawal delirium with phenobarbital  taper at [facility]. Continue thiamine , folate and multivitamin.   Essential hypertension: BP stable.  Amlodipine  and losartan  has been discontinued. Propranolol  dose decreased to avoid hypotension   Constipation: Continue senna, colace, laxatives.    Bradycardia Holding parameters on for propranolol     Severe malnutrition: Continue ensure. Tolerating diet well.   Iron deficiency anemia; continue iron supplement.       Out of bed to chair. Incentive spirometry. Nursing supportive care. Fall, aspiration precautions. Diet:  Diet Orders (From admission, onward)     Start     Ordered   09/09/24 1508  DIET DYS 3 Room service appropriate? Yes with Assist; Fluid consistency: Thin  Diet effective now       Comments: Crush meds with puree; full supervision  Question Answer Comment  Room service appropriate? Yes with Assist   Fluid consistency: Thin      09/09/24 1507           DVT prophylaxis: heparin  injection 5,000 Units Start: 08/13/24 1400 SCDs Start: 08/12/24 1150 Place TED hose Start: 08/12/24 1150 SCDs Start: 08/01/24 1745 Place and maintain sequential compression device Start: 07/29/24 1819  Level of care: Med-Surg   Code Status: Full Code  Subjective: Patient is seen and examined today morning. He is sleepign comfortably. No overnight issues.  Physical Exam: Vitals:   09/25/24 2337 09/26/24 0343 09/26/24 0726 09/26/24 0827  BP:  111/83 111/83  Pulse:   (!) 54 (!) 54  Resp: 20  15   Temp:   97.7 F (36.5 C)   TempSrc:   Oral   SpO2:   96%   Weight:  67.6 kg    Height:         General - Elderly Caucasian male, no apparent distress HEENT - PERRLA, EOMI, atraumatic head, non tender sinuses. Lung - Clear, basal rales, rhonchi, no wheezes. Heart - S1, S2 heard, no murmurs, rubs, no pedal edema. Abdomen - Soft, non tender, bowel sounds good Neuro -sleeping, no new focal deficits. Skin - Warm and dry.  Data Reviewed:      Latest Ref Rng & Units 09/11/2024    9:18 AM 09/08/2024    5:58 AM 09/03/2024    1:31 AM  CBC  WBC 4.0 - 10.5 K/uL 4.1  2.8  3.9   Hemoglobin 13.0 - 17.0 g/dL 87.3  88.4  88.2   Hematocrit 39.0 - 52.0 % 39.1  35.0  35.7   Platelets 150 - 400 K/uL 202  195  239       Latest Ref Rng & Units 09/18/2024    2:10 AM 09/13/2024    5:24 AM 09/11/2024    9:18 AM  BMP  Glucose 70 - 99 mg/dL 898  94  871   BUN 6 - 20 mg/dL 26  17  18    Creatinine 0.61 - 1.24 mg/dL 9.02  9.10  8.92   Sodium 135 - 145 mmol/L 142  140  143   Potassium 3.5 - 5.1 mmol/L 4.2  3.9  4.2   Chloride 98 - 111 mmol/L 107  108  109   CO2 22 - 32 mmol/L 26  27  25    Calcium 8.9 - 10.3 mg/dL 9.5  9.1  9.6    No results found.  Family Communication: no family at bedside  Disposition: Status is: Inpatient Remains inpatient appropriate because: pending placement  Planned Discharge Destination: Skilled nursing facility     Time spent: 43 minutes  Author: Concepcion Riser, MD 09/26/2024 12:37 PM Secure chat 7am to 7pm For on call review [email or web address]    "

## 2024-09-26 NOTE — TOC Progression Note (Signed)
 Transition of Care [facility]) - Progression Note    Patient Details  Name: Ernest Romero MRN: [id number] Date of Birth: [birth year]  [facility] CM/SW Contact  Lauraine FORBES Saa, LCSWA Phone Number: 09/26/2024, 2:51 PM  Clinical Narrative:     2:51 PM Patient does not have any SNF bed offers. Patient currently has 39/99 SNF bed denials. TOC will continue to follow.  Expected Discharge Plan: Skilled Nursing Facility Barriers to Discharge: Continued Medical Work up, English As A Second Language Teacher, SNF Pending bed offer               Expected Discharge Plan and Services In-house Referral: Clinical Social Work Discharge Planning Services: CM Consult                                           Social Drivers of Health (SDOH) Interventions SDOH Screenings   Food Insecurity: Patient Unable To Answer (07/16/2024)  Housing: Unknown (07/16/2024)  Transportation Needs: Patient Unable To Answer (07/16/2024)  Utilities: Patient Unable To Answer (07/16/2024)  Alcohol Screen: Low Risk (08/30/2022)  Depression (PHQ2-9): Medium Risk (08/30/2022)  Tobacco Use: Medium Risk (08/12/2024)    Readmission Risk Interventions    07/16/2024   12:23 PM  Readmission Risk Prevention Plan  Transportation Screening Complete  Medication Review (RN Care Manager) Complete  PCP or Specialist appointment within 3-5 days of discharge Complete  HRI or Home Care Consult Complete  SW Recovery Care/Counseling Consult Complete  Palliative Care Screening Not Applicable  Skilled Nursing Facility Not Applicable

## 2024-09-26 NOTE — Plan of Care (Signed)
  Problem: Coping: Goal: Level of anxiety will decrease Outcome: Progressing   Problem: Nutrition: Goal: Adequate nutrition will be maintained Outcome: Progressing   Problem: Pain Managment: Goal: General experience of comfort will improve and/or be controlled Outcome: Progressing   Problem: Safety: Goal: Ability to remain free from injury will improve Outcome: Progressing

## 2024-09-26 NOTE — Plan of Care (Signed)
" °  Problem: Activity: Goal: Risk for activity intolerance will decrease Outcome: Progressing   Problem: Clinical Measurements: Goal: Usual level of consciousness will be regained or maintained. Outcome: Progressing   Problem: Health Behavior/Discharge Planning: Goal: Ability to manage health-related needs will improve Outcome: Not Progressing   "

## 2024-09-27 NOTE — Plan of Care (Signed)
   Problem: Education: Goal: Knowledge of General Education information will improve Description: Including pain rating scale, medication(s)/side effects and non-pharmacologic comfort measures Outcome: Progressing   Problem: Activity: Goal: Risk for activity intolerance will decrease Outcome: Progressing   Problem: Nutrition: Goal: Adequate nutrition will be maintained Outcome: Progressing   Problem: Coping: Goal: Level of anxiety will decrease Outcome: Not Progressing

## 2024-09-27 NOTE — Progress Notes (Signed)
 " Progress Note   Patient: Ernest Romero FMW:[id number] DOB: 09/14/1966 DOA: 07/15/2024     74 DOS: the patient was seen and examined on 09/27/2024   Brief hospital course: 58 year old homeless, medical history significant for alcohol use disorder, bipolar and panic disorder, initially [facility] to [facility] from jail in September 2025 with a large subdural hematoma.  Underwent craniotomy with flap replacement on 10/27.  Since then multiple OR visits including 10/30 for SDH evacuation, follow-up CT head 11/8 showed fluid collection underneath the bone flap, back to the OR 11/10 and then bone flap was removed, cultures were obtained intraoperative growing MRSA, treating for infected bone flap, ID following and currently on IV vancomycin .   10/27 crani w/ flap replacement 10/29: off clevi, liberate SBP goal, Klonopin , lithium , Haldol  as needed discontinued.  Latuda  dose cut by half 10/30: repeat CT head with increased collection - going to OR this afternoon for evacuation 10/31: OR  for SDH evacuation.  Developed arm twitching.  Klonopin  restarted at half dose 11/1: More awake.  No complaint 11/2 transferred out of ICU. 11/4 cortrak removed 11/8 CT head showed fluid collection under bone flap, decision made to remove bone flap 11/10 to OR for L bone flap removal 11/11 repeat CT head stable 11/29: notice to have worsening Dysphagia.  12/1 repeat CT head: stable.  12/3: Cortrack placed for nutrition, sutures removed by neurosurgery.  Tube feeds started. 12/4: Very sedated, psychiatry requested to readjust meds, reduced clonazepam  to0.5mg  BID, decreased valbenazine  to 40 mg daily 12/5: Noted oriented but jaw tremors worse.  Evaluated by neurology, recommended psych to continue to follow and adjust medications. 12/17 -stable, eating fair, sitter at bedside. 12/20 - able to get out of bed, walking with sitter. 12/22 - finished IV valcomycin 6 weeks therapy. ID advised oral doxy for 2 more weeks from  12/23. 12/23 sitter discontinued, he is doing well. Placement pending. 12/26 tele sitter for safety ordered.  Assessment and Plan: Subdural hematoma /  Status post initial craniotomy 9/28 (Dr. 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 Evaluated by infectious disease who recommends 6 weeks of IV vancomycin , which he will finish today 12/22. Discussed with ID, continue oral doxy for 2 more weeks from 12/23. Completed 1 week of Keppra . Sutures were removed without complication by neurosurgery on 12/3.  Mental status better, able to answer appropriately. Bedside sitter discontinued 09/24/24. Tele sitter ordered 12/26 as he is getting out of bed frequently.    Acute metabolic encephalopathy At baseline, patient reportedly without cognitive impairment, was homeless and independent caring for self. At some point, during this hospitalization patient was deemed not to have capacity, due to encephalopathy and neurological deficits. Per recent physicians notes patient appears to be able to make decision for himself moving forward. Continue to monitor, capacity could fluctuate.  Psychiatry readjusted medications to reduce clonazepam  and valbenazine .  Subsequently became alert, agitated with worsening jaw tremor. Continue Klonopin  to 0.5 mg 3 times daily. Oral ativan  as needed ordered. Stop IV ativan . Cortrack removed on 12/8, patient tolerating diet.   Currently off one-to-one, ordered tele sitter for safety. Placement is challenging.  Bipolar disorder/akathisia/drug-induced parkinsonism: Patient was on Haldol  and Latuda , developed facial tremors 11/20 concern for tardive dyskinesia. EEG negative for seizures. Latuda  and Haldol  discontinued. Patient was started on propranolol  and Klonopin .  On Ingrezza .  Sleeps most of the time, continue management per psych.  Dysphagia Evaluated by speech. MBS: signs of  aspiration.  Alert and oriented, core track removed on 12/8, tolerating diet and meds.   Aspiration PNA;  Acute hypoxic Resp failure.  In the setting of dysphagia and aspiration   Completed 5 days of IV Unasyn    History of alcohol, tobacco use disorder Alcohol withdrawal: resolved. Continue thiamine , folic acid  and multivitamin Treated for alcohol withdrawal delirium with phenobarbital  taper at [facility]. Continue thiamine , folate and multivitamin.   Essential hypertension: BP stable.  Amlodipine  and losartan  has been discontinued. Propranolol  dose decreased to avoid hypotension   Constipation: Continue senna, colace, laxatives.    Bradycardia Holding parameters on for propranolol     Severe malnutrition: Continue ensure. Tolerating diet well.   Iron deficiency anemia; continue iron supplement.       Out of bed to chair. Incentive spirometry. Nursing supportive care. Fall, aspiration precautions. Diet:  Diet Orders (From admission, onward)     Start     Ordered   09/09/24 1508  DIET DYS 3 Room service appropriate? Yes with Assist; Fluid consistency: Thin  Diet effective now       Comments: Crush meds with puree; full supervision  Question Answer Comment  Room service appropriate? Yes with Assist   Fluid consistency: Thin      09/09/24 1507           DVT prophylaxis: heparin  injection 5,000 Units Start: 08/13/24 1400 SCDs Start: 08/12/24 1150 Place TED hose Start: 08/12/24 1150 SCDs Start: 08/01/24 1745 Place and maintain sequential compression device Start: 07/29/24 1819  Level of care: Med-Surg   Code Status: Full Code  Subjective: Patient is seen and examined today morning. He is sleeping comfortably. Often gets out of bed per RN, ordered telesitter.  Physical Exam: Vitals:   09/26/24 1801 09/27/24 0449 09/27/24 0503 09/27/24 1100  BP: 104/74  107/74 110/86  Pulse: 62   70  Resp: 16  18 16   Temp: (!) 97.5 F (36.4 C)  97.6 F (36.4 C) 98.2 F (36.8  C)  TempSrc: Oral  Oral Oral  SpO2: 96%  97% 98%  Weight:  67.3 kg    Height:        General - Elderly Caucasian male, no apparent distress HEENT - PERRLA, EOMI, atraumatic head, non tender sinuses. Lung - Clear, basal rales, rhonchi, no wheezes. Heart - S1, S2 heard, no murmurs, rubs, no pedal edema. Abdomen - Soft, non tender, bowel sounds good Neuro -sleeping, no new focal deficits. Skin - Warm and dry.  Data Reviewed:      Latest Ref Rng & Units 09/11/2024    9:18 AM 09/08/2024    5:58 AM 09/03/2024    1:31 AM  CBC  WBC 4.0 - 10.5 K/uL 4.1  2.8  3.9   Hemoglobin 13.0 - 17.0 g/dL 87.3  88.4  88.2   Hematocrit 39.0 - 52.0 % 39.1  35.0  35.7   Platelets 150 - 400 K/uL 202  195  239       Latest Ref Rng & Units 09/18/2024    2:10 AM 09/13/2024    5:24 AM 09/11/2024    9:18 AM  BMP  Glucose 70 - 99 mg/dL 898  94  871   BUN 6 - 20 mg/dL 26  17  18    Creatinine 0.61 - 1.24 mg/dL 9.02  9.10  8.92   Sodium 135 - 145 mmol/L 142  140  143   Potassium 3.5 - 5.1 mmol/L 4.2  3.9  4.2   Chloride 98 - 111 mmol/L 107  108  109   CO2 22 - 32 mmol/L 26  27  25    Calcium 8.9 - 10.3 mg/dL 9.5  9.1  9.6    No results found.  Family Communication: no family at bedside  Disposition: Status is: Inpatient Remains inpatient appropriate because: pending placement  Planned Discharge Destination: Skilled nursing facility     Time spent: 42 minutes  Author: Concepcion Riser, MD 09/27/2024 1:29 PM Secure chat 7am to 7pm For on call review [email or web address]    "

## 2024-09-27 NOTE — Plan of Care (Signed)
  Problem: Education: Goal: Knowledge of General Education information will improve Description: Including pain rating scale, medication(s)/side effects and non-pharmacologic comfort measures Outcome: Progressing   Problem: Health Behavior/Discharge Planning: Goal: Ability to manage health-related needs will improve Outcome: Progressing   Problem: Clinical Measurements: Goal: Ability to maintain clinical measurements within normal limits will improve Outcome: Progressing Goal: Will remain free from infection Outcome: Progressing Goal: Diagnostic test results will improve Outcome: Progressing Goal: Respiratory complications will improve Outcome: Progressing Goal: Cardiovascular complication will be avoided Outcome: Progressing   Problem: Activity: Goal: Risk for activity intolerance will decrease Outcome: Progressing   Problem: Nutrition: Goal: Adequate nutrition will be maintained Outcome: Progressing   Problem: Coping: Goal: Level of anxiety will decrease Outcome: Progressing   Problem: Elimination: Goal: Will not experience complications related to bowel motility Outcome: Progressing Goal: Will not experience complications related to urinary retention Outcome: Progressing   Problem: Pain Managment: Goal: General experience of comfort will improve and/or be controlled Outcome: Progressing   Problem: Safety: Goal: Ability to remain free from injury will improve Outcome: Progressing   Problem: Skin Integrity: Goal: Risk for impaired skin integrity will decrease Outcome: Progressing   Problem: Education: Goal: Knowledge of the prescribed therapeutic regimen will improve Outcome: Progressing   Problem: Clinical Measurements: Goal: Usual level of consciousness will be regained or maintained. Outcome: Progressing Goal: Neurologic status will improve Outcome: Progressing Goal: Ability to maintain intracranial pressure will improve Outcome: Progressing    Problem: Skin Integrity: Goal: Demonstration of wound healing without infection will improve Outcome: Progressing

## 2024-09-28 NOTE — Progress Notes (Signed)
 " Progress Note   Patient: Ernest Romero FMW:[id number] DOB: 1966/04/29 DOA: 07/15/2024     75 DOS: the patient was seen and examined on 09/28/2024   Brief hospital course: 58 year old homeless, medical history significant for alcohol use disorder, bipolar and panic disorder, initially presented to [facility] from jail in September 2025 with a large subdural hematoma.  Underwent craniotomy with flap replacement on 10/27.  Since then multiple OR visits including 10/30 for SDH evacuation, follow-up CT head 11/8 showed fluid collection underneath the bone flap, back to the OR 11/10 and then bone flap was removed, cultures were obtained intraoperative growing MRSA, treating for infected bone flap, ID following and currently on IV vancomycin .   10/27 crani w/ flap replacement 10/29: off clevi, liberate SBP goal, Klonopin , lithium , Haldol  as needed discontinued.  Latuda  dose cut by half 10/30: repeat CT head with increased collection - going to OR this afternoon for evacuation 10/31: OR  for SDH evacuation.  Developed arm twitching.  Klonopin  restarted at half dose 11/1: More awake.  No complaint 11/2 transferred out of ICU. 11/4 cortrak removed 11/8 CT head showed fluid collection under bone flap, decision made to remove bone flap 11/10 to OR for L bone flap removal 11/11 repeat CT head stable 11/29: notice to have worsening Dysphagia.  12/1 repeat CT head: stable.  12/3: Cortrack placed for nutrition, sutures removed by neurosurgery.  Tube feeds started. 12/4: Very sedated, psychiatry requested to readjust meds, reduced clonazepam  to0.5mg  BID, decreased valbenazine  to 40 mg daily 12/5: Noted oriented but jaw tremors worse.  Evaluated by neurology, recommended psych to continue to follow and adjust medications. 12/17 -stable, eating fair, sitter at bedside. 12/20 - able to get out of bed, walking with sitter. 12/22 - finished IV valcomycin 6 weeks therapy. ID advised oral doxy for 2 more weeks from  12/23. 12/23 sitter discontinued, he is doing well. Placement pending. 12/24 IV ativan  changed to oral. He has jaw tremors. 12/26 tele sitter for safety ordered.  Assessment and Plan: Subdural hematoma /  Status post initial craniotomy 9/28 (Dr. 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 Evaluated by infectious disease who recommends 6 weeks of IV vancomycin , which he will finish today 60/22. Discussed with ID, continue oral doxy for 2 more weeks from 12/23. Completed 1 week of Keppra . Sutures were removed without complication by neurosurgery on 12/3.  Mental status better, able to answer appropriately. Bedside sitter discontinued 09/24/24. Tele sitter ordered 12/26 as he is getting out of bed frequently.    Acute metabolic encephalopathy At baseline, patient reportedly without cognitive impairment, was homeless and independent caring for self. At some point, during this hospitalization patient was deemed not to have capacity, due to encephalopathy and neurological deficits. Per recent physicians notes patient appears to be able to make decision for himself moving forward. Continue to monitor, capacity could fluctuate.  Psychiatry readjusted medications to reduce clonazepam  and valbenazine .  Subsequently became alert, agitated with worsening jaw tremor. Continue Klonopin  to 0.5 mg 3 times daily. Oral ativan  as needed ordered. Stop IV ativan . Cortrack removed on 12/8, patient tolerating diet.   Currently off one-to-one, ordered tele sitter for safety 12/26. Placement is challenging.  Bipolar disorder/akathisia/drug-induced parkinsonism: Patient was on Haldol  and Latuda , developed facial tremors 11/20 concern for tardive dyskinesia. EEG negative for seizures. Latuda  and Haldol  discontinued. Patient was started on propranolol  and Klonopin .  On  Ingrezza .  Sleeps most of the time, mental status better.    Dysphagia Evaluated by speech. MBS: signs of aspiration.  Alert and oriented, core track removed on 12/8, tolerating diet and meds.   Aspiration PNA;  Acute hypoxic Resp failure.  In the setting of dysphagia and aspiration   Completed 5 days of IV Unasyn    History of alcohol, tobacco use disorder Alcohol withdrawal: resolved. Continue thiamine , folic acid  and multivitamin Treated for alcohol withdrawal delirium with phenobarbital  taper at [facility]. Continue thiamine , folate and multivitamin.   Essential hypertension: BP stable.  Amlodipine  and losartan  has been discontinued. Propranolol  dose decreased to avoid hypotension   Constipation: Continue senna, colace, laxatives.    Bradycardia Holding parameters on for propranolol     Severe malnutrition: Continue ensure. Tolerating diet well.   Iron deficiency anemia; continue iron supplement.       Out of bed to chair. Incentive spirometry. Nursing supportive care. Fall, aspiration precautions. Diet:  Diet Orders (From admission, onward)     Start     Ordered   09/09/24 1508  DIET DYS 3 Room service appropriate? Yes with Assist; Fluid consistency: Thin  Diet effective now       Comments: Crush meds with puree; full supervision  Question Answer Comment  Room service appropriate? Yes with Assist   Fluid consistency: Thin      09/09/24 1507           DVT prophylaxis: heparin  injection 5,000 Units Start: 08/13/24 1400 SCDs Start: 08/12/24 1150 Place TED hose Start: 08/12/24 1150 SCDs Start: 08/01/24 1745 Place and maintain sequential compression device Start: 07/29/24 1819  Level of care: Med-Surg   Code Status: Full Code  Subjective: Patient is seen and examined today morning. He is sitting in chair, able to answer me. Jaw tremors improving per him. Renewed telesitter per RN request.  Physical Exam: Vitals:   09/28/24 0500 09/28/24 0627 09/28/24 0852 09/28/24 1145  BP:   114/79 108/81  Pulse:  65 63 61  Resp:    17 16  Temp:    98.5 F (36.9 C)  TempSrc:    Oral  SpO2:   96% 96%  Weight: 69.5 kg     Height:        General - Elderly Caucasian male, no apparent distress HEENT - PERRLA, EOMI, atraumatic head, non tender sinuses. Lung - Clear, basal rales, rhonchi, no wheezes. Heart - S1, S2 heard, no murmurs, rubs, no pedal edema. Abdomen - Soft, non tender, bowel sounds good Neuro -alert, awake, no new focal deficits. Skin - Warm and dry.  Data Reviewed:      Latest Ref Rng & Units 09/11/2024    9:18 AM 09/08/2024    5:58 AM 09/03/2024    1:31 AM  CBC  WBC 4.0 - 10.5 K/uL 4.1  2.8  3.9   Hemoglobin 13.0 - 17.0 g/dL 87.3  88.4  88.2   Hematocrit 39.0 - 52.0 % 39.1  35.0  35.7   Platelets 150 - 400 K/uL 202  195  239       Latest Ref Rng & Units 09/18/2024    2:10 AM 09/13/2024    5:24 AM 09/11/2024    9:18 AM  BMP  Glucose 70 - 99 mg/dL 898  94  871   BUN 6 - 20 mg/dL 26  17  18    Creatinine 0.61 - 1.24 mg/dL 9.02  9.10  8.92   Sodium 135 - 145 mmol/L 142  140  143   Potassium 3.5 - 5.1 mmol/L 4.2  3.9  4.2   Chloride 98 - 111 mmol/L 107  108  109   CO2 22 - 32 mmol/L 26  27  25    Calcium 8.9 - 10.3 mg/dL 9.5  9.1  9.6    No results found.  Family Communication: no family at bedside  Disposition: Status is: Inpatient Remains inpatient appropriate because: pending placement  Planned Discharge Destination: Skilled nursing facility     Time spent: 43 minutes  Author: Concepcion Riser, MD 09/28/2024 3:30 PM Secure chat 7am to 7pm For on call review [email or web address]    "

## 2024-09-28 NOTE — Plan of Care (Signed)
  Problem: Education: Goal: Knowledge of General Education information will improve Description: Including pain rating scale, medication(s)/side effects and non-pharmacologic comfort measures Outcome: Progressing   Problem: Health Behavior/Discharge Planning: Goal: Ability to manage health-related needs will improve Outcome: Progressing   Problem: Clinical Measurements: Goal: Ability to maintain clinical measurements within normal limits will improve Outcome: Progressing Goal: Will remain free from infection Outcome: Progressing Goal: Diagnostic test results will improve Outcome: Progressing Goal: Respiratory complications will improve Outcome: Progressing Goal: Cardiovascular complication will be avoided Outcome: Progressing   Problem: Activity: Goal: Risk for activity intolerance will decrease Outcome: Progressing   Problem: Nutrition: Goal: Adequate nutrition will be maintained Outcome: Progressing   Problem: Coping: Goal: Level of anxiety will decrease Outcome: Progressing   Problem: Elimination: Goal: Will not experience complications related to bowel motility Outcome: Progressing Goal: Will not experience complications related to urinary retention Outcome: Progressing   Problem: Pain Managment: Goal: General experience of comfort will improve and/or be controlled Outcome: Progressing   Problem: Safety: Goal: Ability to remain free from injury will improve Outcome: Progressing   Problem: Skin Integrity: Goal: Risk for impaired skin integrity will decrease Outcome: Progressing   Problem: Education: Goal: Knowledge of the prescribed therapeutic regimen will improve Outcome: Progressing   Problem: Clinical Measurements: Goal: Usual level of consciousness will be regained or maintained. Outcome: Progressing Goal: Neurologic status will improve Outcome: Progressing Goal: Ability to maintain intracranial pressure will improve Outcome: Progressing    Problem: Skin Integrity: Goal: Demonstration of wound healing without infection will improve Outcome: Progressing

## 2024-09-28 NOTE — Plan of Care (Signed)

## 2024-09-29 NOTE — Progress Notes (Signed)
 " PROGRESS NOTE  SANCHEZ HEMMER FMW:[id number] DOB: 08-07-1966 DOA: 07/15/2024 PCP: [name]   LOS: 76 days   Brief narrative:  Patient is a 58 year old male with past medical history of alcohol use disorder, bipolar and panic disorder, initially presented to [facility] from jail in September 2025 with a large subdural hematoma.  Patient then underwent craniotomy with flap replacement on 10/27.  Since then multiple OR visits including 10/30 for SDH evacuation, follow-up CT head 11/8 showed fluid collection underneath the bone flap, back to the OR 11/10 and then bone flap was removed, cultures were obtained intraoperative growing MRSA, treating for infected bone flap.  Sequence of events  10/27 crani w/ flap replacement 10/30: repeat CT head with increased collection  10/31: OR  for SDH evacuation.  Developed arm twitching.  Klonopin  restarted at half dose 11/2 transferred out of ICU. 11/4 cortrak removed 11/8 CT head showed fluid collection under bone flap, decision made to remove bone flap 11/10 to OR for L bone flap removal 11/11 repeat CT head stable 11/29: notice to have worsening Dysphagia.  12/1 repeat CT head: stable.  12/3: Cortrack placed for nutrition, sutures removed by neurosurgery.  Tube feeds started. 12/5: Noted oriented but jaw tremors worse.  Evaluated by neurology, recommended psych to continue to follow and adjust medications. 12/17 -stable, eating fair, sitter at bedside. 12/20 - able to get out of bed, walking with sitter. 12/22 - finished IV valcomycin 6 weeks therapy. ID advised oral doxy for 2 more weeks from 12/23. 12/23 sitter discontinued, he is doing well. Placement pending. 12/24 IV ativan  changed to oral. He has jaw tremors. 12/26 tele sitter for safety ordered.     Assessment/Plan: Principal Problem:   Subdural hematoma (HCC) Active Problems:   Bipolar disorder (HCC)   Unable to make decisions about medical treatment due to  impaired mental capacity   Alcohol withdrawal (HCC)   Essential hypertension   Protein-calorie malnutrition, severe   Acute metabolic encephalopathy   Infection of craniotomy plate  Subdural hematoma /  Status post initial craniotomy 9/28  Status post skull flap left cranioplasty 10/27 ( [name]) Complicated by MRSA infected bone flap status post removal 11/10. Patient was seen by infectious disease and has completed 6 weeks course of IV vancomycin  on 09/23/2024. ID has recommended to continue oral doxy for 2 more weeks from 12/23. Completed 1 week of Keppra .    Acute metabolic encephalopathy At baseline patient was independent with no cognitive deficits.  During hospitalization patient was seen by psychiatry and is being adjusted on medications.  Does not have decision-making capacity.  Currently on Klonopin .  Has been needing recruitment consultant.   Bipolar disorder/akathisia/drug-induced parkinsonism: Patient was on Haldol  and Latuda , developed facial tremors 11/20 concern for tardive dyskinesia. EEG negative for seizures. Latuda  and Haldol  was discontinued. Patient was started on propranolol  and Klonopin .  On Ingrezza .    Dysphagia Evaluated by speech. MBS: signs of aspiration..  Patient is alert awake and oriented.  Cortrak tube tube was removed on 09/09/2024.  Tolerating diet and meds.   Aspiration PNA with Acute hypoxic Resp failure.   In the setting of dysphagia and aspiration.  Completed 5-day course of Unasyn .   History of alcohol, tobacco use disorder Alcohol withdrawal: resolved. Continue thiamine , folic acid  and multivitamin.  On phenobarbital  taper.   Essential hypertension: On decreased dose of propranolol .  Amlodipine  and losartan  has been discontinued.  Constipation: Continue senna, colace, laxatives.  Bradycardia Current continue with holding parameters on for propranolol     Severe malnutrition: Nutrition Status: Nutrition Problem: Severe Malnutrition Etiology:  acute illness Signs/Symptoms: severe muscle depletion, moderate muscle depletion Interventions: Ensure Enlive (each supplement provides 350kcal and 20 grams of protein), MVI   Continue ensure. Tolerating diet well.   Iron deficiency anemia; continue iron supplement.   DVT prophylaxis: heparin  injection 5,000 Units Start: 08/13/24 1400 SCDs Start: 08/12/24 1150 Place TED hose Start: 08/12/24 1150 SCDs Start: 08/01/24 1745 Place and maintain sequential compression device Start: 07/29/24 1819   Disposition: Difficult disposition.  Likely to skilled nursing facility.  Status is: Inpatient Remains inpatient appropriate because: Difficult disposition, pending placement    Code Status:     Code Status: Full Code  Family Communication: None at bedside  Consultants: Psychiatry Infectious disease ID  Anti-infectives:  Doxycycline   Anti-infectives (From admission, onward)    Start     Dose/Rate Route Frequency Ordered Stop   09/24/24 1000  doxycycline  (VIBRA -TABS) tablet 100 mg  Status:  Discontinued        100 mg Oral Every 12 hours 09/21/24 1455 09/23/24 1142   09/24/24 1000  doxycycline  (VIBRA -TABS) tablet 100 mg        100 mg Oral Every 12 hours 09/23/24 1142 10/08/24 0959   09/13/24 1000  vancomycin  (VANCOCIN ) IVPB 1000 mg/200 mL premix        1,000 mg 200 mL/hr over 60 Minutes Intravenous Every 12 hours 09/13/24 0835 09/24/24 0559   09/02/24 1700  Ampicillin -Sulbactam (UNASYN ) 3 g in sodium chloride  0.9 % 100 mL IVPB        3 g 200 mL/hr over 30 Minutes Intravenous Every 6 hours 09/02/24 1611 09/07/24 1230   08/31/24 1000  vancomycin  (VANCOREADY) IVPB 750 mg/150 mL  Status:  Discontinued        750 mg 150 mL/hr over 60 Minutes Intravenous Every 12 hours 08/31/24 0015 09/13/24 0835   08/21/24 0800  vancomycin  (VANCOCIN ) IVPB 1000 mg/200 mL premix  Status:  Discontinued        1,000 mg 200 mL/hr over 60 Minutes Intravenous Every 12 hours 08/21/24 0720 08/31/24 0015    08/19/24 0600  vancomycin  (VANCOREADY) IVPB 1250 mg/250 mL  Status:  Discontinued        1,250 mg 166.7 mL/hr over 90 Minutes Intravenous Every 12 hours 08/18/24 1818 08/21/24 0547   08/18/24 1830  vancomycin  (VANCOREADY) IVPB 500 mg/100 mL        500 mg 100 mL/hr over 60 Minutes Intravenous  Once 08/18/24 1817 08/19/24 1642   08/16/24 1600  vancomycin  (VANCOREADY) IVPB 750 mg/150 mL  Status:  Discontinued        750 mg 150 mL/hr over 60 Minutes Intravenous Every 8 hours 08/16/24 0828 08/18/24 1818   08/14/24 0800  vancomycin  (VANCOCIN ) IVPB 1000 mg/200 mL premix  Status:  Discontinued        1,000 mg 200 mL/hr over 60 Minutes Intravenous Every 8 hours 08/14/24 0016 08/16/24 0828   08/14/24 0030  vancomycin  (VANCOCIN ) IVPB 1000 mg/200 mL premix        1,000 mg 200 mL/hr over 60 Minutes Intravenous NOW 08/14/24 0016 08/14/24 0135   08/13/24 0100  vancomycin  (VANCOREADY) IVPB 750 mg/150 mL  Status:  Discontinued        750 mg 150 mL/hr over 60 Minutes Intravenous Every 12 hours 08/12/24 1230 08/14/24 0007   08/12/24 1400  ceFAZolin  (ANCEF ) IVPB 2g/100 mL premix  Status:  Discontinued  2 g 200 mL/hr over 30 Minutes Intravenous Every 8 hours 08/12/24 1149 08/12/24 1230   08/12/24 1400  cefTAZidime  (FORTAZ ) 2 g in sodium chloride  0.9 % 100 mL IVPB  Status:  Discontinued        2 g 200 mL/hr over 30 Minutes Intravenous Every 8 hours 08/12/24 1152 08/13/24 1109   08/12/24 1245  ceFAZolin  (ANCEF ) IVPB 2g/100 mL premix  Status:  Discontinued        2 g 200 mL/hr over 30 Minutes Intravenous  Once 08/12/24 1149 08/12/24 1449   08/12/24 1245  vancomycin  (VANCOREADY) IVPB 1250 mg/250 mL        1,250 mg 166.7 mL/hr over 90 Minutes Intravenous  Once 08/12/24 1152 08/12/24 1501   08/12/24 0957  vancomycin  (VANCOCIN ) powder  Status:  Discontinued          As needed 08/12/24 1008 08/12/24 1037   08/01/24 2200  ceFAZolin  (ANCEF ) IVPB 2g/100 mL premix        2 g 200 mL/hr over 30 Minutes  Intravenous Every 8 hours 08/01/24 1703 08/02/24 1915   08/01/24 1530  vancomycin  (VANCOCIN ) powder  Status:  Discontinued          As needed 08/01/24 1559 08/01/24 1606   08/01/24 1421  ceFAZolin  (ANCEF ) 2-4 GM/100ML-% IVPB       Note to Pharmacy: Roslynn Birmingham: cabinet override      08/01/24 1421 08/02/24 0229   07/29/24 2200  ceFAZolin  (ANCEF ) IVPB 2g/100 mL premix        2 g 200 mL/hr over 30 Minutes Intravenous Every 8 hours 07/29/24 1859 07/30/24 1423   07/29/24 1654  vancomycin  (VANCOCIN ) powder  Status:  Discontinued          As needed 07/29/24 1655 07/29/24 1758   07/29/24 1515  ceFAZolin  (ANCEF ) IVPB 2g/100 mL premix        2 g 200 mL/hr over 30 Minutes Intravenous  Once 07/29/24 1421 07/29/24 1655        Subjective: Today, patient was seen and examined at bedside.  Patient is today as he has walked okay.  Eating some.  Denies any nausea vomiting shortness of breath or dyspnea.  Off sitter.  Objective: Vitals:   09/29/24 0422 09/29/24 0746  BP: 115/76 119/78  Pulse: 61 61  Resp: 19 18  Temp: 97.9 F (36.6 C) 97.8 F (36.6 C)  SpO2: 95% 98%   No intake or output data in the 24 hours ending 09/29/24 0751 Filed Weights   09/26/24 0343 09/27/24 0449 09/28/24 0500  Weight: 67.6 kg 67.3 kg 69.5 kg   Body mass index is 21.98 kg/m.   Physical Exam:  GENERAL: Patient is alert awake and anxious, Not in obvious distress.  Depressed scalp on the left side. HENT: No scleral pallor or icterus. Pupils equally reactive to light. Oral mucosa is moist NECK: is supple, no gross swelling noted. CHEST: Clear to auscultation. No crackles or wheezes.  Diminished breath sounds bilaterally. CVS: S1 and S2 heard, no murmur. Regular rate and rhythm.  ABDOMEN: Soft, non-tender, bowel sounds are present. EXTREMITIES: No edema. CNS: Cranial nerves are intact. No focal motor deficits.  Moves all extremities, tremors noted. SKIN: warm and dry without rashes.  Data Review: I have  personally reviewed the following laboratory data and studies,  CBC: No results for input(s): WBC, NEUTROABS, HGB, HCT, MCV, PLT in the last 168 hours. Basic Metabolic Panel: No results for input(s): NA, K, CL, CO2, GLUCOSE, BUN, CREATININE,  CALCIUM, MG, PHOS in the last 168 hours. Liver Function Tests: No results for input(s): AST, ALT, ALKPHOS, BILITOT, PROT, ALBUMIN  in the last 168 hours. No results for input(s): LIPASE, AMYLASE in the last 168 hours. No results for input(s): AMMONIA in the last 168 hours. Cardiac Enzymes: No results for input(s): CKTOTAL, CKMB, CKMBINDEX, TROPONINI in the last 168 hours. BNP (last 3 results) No results for input(s): BNP in the last 8760 hours.  ProBNP (last 3 results) No results for input(s): PROBNP in the last 8760 hours.  CBG: Recent Labs  Lab 09/25/24 0807 09/25/24 1142 09/25/24 1652 09/25/24 1926  GLUCAP 105* 106* 107* 110*   No results found for this or any previous visit (from the past 240 hours).   Studies: No results found.    Vernal Alstrom, MD  [facility] 09/29/2024  If 7PM-7AM, please contact night-coverage             "

## 2024-09-29 NOTE — Plan of Care (Signed)
  Problem: Education: Goal: Knowledge of General Education information will improve Description: Including pain rating scale, medication(s)/side effects and non-pharmacologic comfort measures Outcome: Progressing   Problem: Health Behavior/Discharge Planning: Goal: Ability to manage health-related needs will improve Outcome: Progressing   Problem: Clinical Measurements: Goal: Ability to maintain clinical measurements within normal limits will improve Outcome: Progressing Goal: Will remain free from infection Outcome: Progressing Goal: Diagnostic test results will improve Outcome: Progressing Goal: Respiratory complications will improve Outcome: Progressing Goal: Cardiovascular complication will be avoided Outcome: Progressing   Problem: Activity: Goal: Risk for activity intolerance will decrease Outcome: Progressing   Problem: Nutrition: Goal: Adequate nutrition will be maintained Outcome: Progressing   Problem: Coping: Goal: Level of anxiety will decrease Outcome: Progressing   Problem: Elimination: Goal: Will not experience complications related to bowel motility Outcome: Progressing Goal: Will not experience complications related to urinary retention Outcome: Progressing   Problem: Pain Managment: Goal: General experience of comfort will improve and/or be controlled Outcome: Progressing   Problem: Safety: Goal: Ability to remain free from injury will improve Outcome: Progressing   Problem: Skin Integrity: Goal: Risk for impaired skin integrity will decrease Outcome: Progressing   Problem: Education: Goal: Knowledge of the prescribed therapeutic regimen will improve Outcome: Progressing   Problem: Clinical Measurements: Goal: Usual level of consciousness will be regained or maintained. Outcome: Progressing Goal: Neurologic status will improve Outcome: Progressing Goal: Ability to maintain intracranial pressure will improve Outcome: Progressing    Problem: Skin Integrity: Goal: Demonstration of wound healing without infection will improve Outcome: Progressing

## 2024-09-29 NOTE — Plan of Care (Signed)

## 2024-09-30 LAB — BASIC METABOLIC PANEL WITH GFR
Anion gap: 10 (ref 5–15)
BUN: 23 mg/dL — ABNORMAL HIGH (ref 6–20)
CO2: 24 mmol/L (ref 22–32)
Calcium: 9.8 mg/dL (ref 8.9–10.3)
Chloride: 107 mmol/L (ref 98–111)
Creatinine, Ser: 0.97 mg/dL (ref 0.61–1.24)
GFR, Estimated: >60 mL/min
Glucose, Bld: 117 mg/dL — ABNORMAL HIGH (ref 70–99)
Potassium: 4.4 mmol/L (ref 3.5–5.1)
Sodium: 141 mmol/L (ref 135–145)

## 2024-09-30 LAB — CBC
HCT: 45.5 % (ref 39.0–52.0)
Hemoglobin: 14.7 g/dL (ref 13.0–17.0)
MCH: 29.7 pg (ref 26.0–34.0)
MCHC: 32.3 g/dL (ref 30.0–36.0)
MCV: 91.9 fL (ref 80.0–100.0)
Platelets: 220 K/uL (ref 150–400)
RBC: 4.95 MIL/uL (ref 4.22–5.81)
RDW: 13.4 % (ref 11.5–15.5)
WBC: 5 K/uL (ref 4.0–10.5)
nRBC: 0 % (ref 0.0–0.2)

## 2024-09-30 LAB — PHOSPHORUS: Phosphorus: 3.8 mg/dL (ref 2.5–4.6)

## 2024-09-30 LAB — MAGNESIUM: Magnesium: 2.1 mg/dL (ref 1.7–2.4)

## 2024-09-30 NOTE — Progress Notes (Signed)
 Physical Therapy Treatment and Progress Note Patient Details Name: Ernest Romero MRN: [id number] DOB: 1965/10/24 Today's Date: 09/30/2024   History of Present Illness 58 y.o. male presenting 07/16/24 from [facility] where he was admitted 06/30/24 for unresponsive episode in the jail. 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 On CIWA protocol. Transferred to [facility] for further management of craniotomy and flap. 10/27 s/p cranioplasty with bone flap replacement. 10/28 Change in status thought to be related to Klonopin  administration 10/28, repeat CTH shows significant increase in size of mixed attenuation subdural fluid collection underlying the L tempoparietal craniotomy site, now measuring approximately 15 mm in thickness, with associated mild mass effect and slight rightward midline shift. OR on 10/30 for SDH evacuation. [facility] 11/7 with fluid collected under bone flap. 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 Worsening dysphagia 11/29; repeat CTH stable 12/1. Cortrak placed 12/3-12/8. PMH includes alcohol use disorder, psychiatric disorder, HTN.    PT Comments  Goals remain the same; pt continues to slowly work towards goals. Currently pt is supervision for sit to stand, CGA for gait and significant balance deficits. Worked on gait/balance deficits this session with gait form in order to improve balance with gait with reciprocal arm/trunk swing, gait form with use of tiles for visual and posterior balance in order to decrease posterior lean in standing. Due to pt current functional status, home set up and available assistance at home recommending skilled physical therapy services < 3 hours/day in order to address strength, balance and functional mobility to decrease risk for falls, injury, immobility, skin break down and re-hospitalization.      If plan is discharge home, recommend the following: Assistance with  cooking/housework;Direct supervision/assist for medications management;Direct supervision/assist for financial management;Assist for transportation;Help with stairs or ramp for entrance;Supervision due to cognitive status;A little help with walking and/or transfers;A little help with bathing/dressing/bathroom   Can travel by private vehicle     Yes  Equipment Recommendations  Rollator (4 wheels)       Precautions / Restrictions Precautions Precautions: Fall Recall of Precautions/Restrictions: Impaired Precaution/Restrictions Comments: SBP <160, helmet (pt to wear when OOB per RN) Restrictions Weight Bearing Restrictions Per Provider Order: No     Mobility  Bed Mobility Overal bed mobility: Needs Assistance Bed Mobility: Supine to Sit, Sit to Supine     Supine to sit: Supervision Sit to supine: Supervision        Transfers Overall transfer level: Needs assistance Equipment used: None Transfers: Sit to/from Stand Sit to Stand: Supervision           General transfer comment: pt attempting to get out of bed without helmet; requires cues to don.    Ambulation/Gait Ambulation/Gait assistance: Contact guard assist Gait Distance (Feet): 200 Feet Assistive device: None Gait Pattern/deviations: Step-through pattern, Decreased stride length, Narrow base of support Gait velocity: Decreased Gait velocity interpretation: 1.31 - 2.62 ft/sec, indicative of limited community ambulator   General Gait Details: worked on gait with longer/wider/higher steps with cues using tiles on the floor pt with difficulty initially but with multiple demonstrations able to attempt. Pt had difficulty taking steps large enough for one box with each foot. Worked on stepping on the lines for wider gait and arm swing in order to improve balance with gait which in turn increased gait speed with Min A for arm swing and trunk rotation during gait for 3 laps.  Balance Overall balance assessment:  Needs assistance Sitting-balance support: No upper extremity supported, Feet supported Sitting balance-Leahy Scale: Good Sitting balance - Comments: performing multiple bouts of seated ADL tasks at sink (including lower body bathing, removing socks, etc.) with good seated balance Postural control: Posterior lean Standing balance support: No upper extremity supported, During functional activity Standing balance-Leahy Scale: Fair Standing balance comment: supervision standing.      Communication Communication Communication: Impaired Factors Affecting Communication: Difficulty expressing self;Reduced clarity of speech  Cognition Arousal: Alert Behavior During Therapy: Flat affect   PT - Cognitive impairments: Memory, Problem solving, Safety/Judgement, Sequencing, Awareness, Attention       Rancho Levels of Cognitive Functioning Ranch[place] Amigos Scales of Cognitive Functioning: Automatic, Appropriate: Minimal Assistance for Daily Living Skills Ranch[place] Amigos Scales of Cognitive Functioning: Automatic, Appropriate: Minimal Assistance for Daily Living Skills [VII]   Following commands: Impaired Following commands impaired: Follows multi-step commands with increased time, Follows multi-step commands inconsistently    Cueing Cueing Techniques: Verbal cues, Gestural cues     General Comments General comments (skin integrity, edema, etc.): worked on reaching forward without moving feet to touch wall then leaning forward to touch bed, WB through bil UE with modified arm raises then modified alt arm/leg raises with improved posterior lean after neuromuscular activities.      Pertinent Vitals/Pain Pain Assessment Pain Assessment: Faces Faces Pain Scale: Hurts a little bit Pain Location: mouth/jaw Pain Descriptors / Indicators: Discomfort Pain Intervention(s): Monitored during session     PT Goals (current goals can now be found in the care plan section) Acute Rehab PT  Goals Patient Stated Goal: none stated PT Goal Formulation: With patient Time For Goal Achievement: 10/14/24 Potential to Achieve Goals: Fair Progress towards PT goals: Progressing toward goals    Frequency    Min 2X/week      PT Plan  Continue with current POC        AM-PAC PT 6 Clicks Mobility   Outcome Measure  Help needed turning from your back to your side while in a flat bed without using bedrails?: None Help needed moving from lying on your back to sitting on the side of a flat bed without using bedrails?: None Help needed moving to and from a bed to a chair (including a wheelchair)?: A Little Help needed standing up from a chair using your arms (e.g., wheelchair or bedside chair)?: A Little Help needed to walk in hospital room?: A Little Help needed climbing 3-5 steps with a railing? : A Lot 6 Click Score: 19    End of Session Equipment Utilized During Treatment: Gait belt Activity Tolerance: Patient tolerated treatment well Patient left: in bed;with call bell/phone within reach;with bed alarm set Nurse Communication: Mobility status PT Visit Diagnosis: Other symptoms and signs involving the nervous system (R29.898);Muscle weakness (generalized) (M62.81);Unsteadiness on feet (R26.81);Other abnormalities of gait and mobility (R26.89)     Time: 1413-1430 PT Time Calculation (min) (ACUTE ONLY): 17 min  Charges:    $Gait Training: 8-22 mins PT General Charges $$ ACUTE PT VISIT: 1 Visit                     Dorothyann Maier, DPT, CLT  Acute Rehabilitation Services Office: [phone] (Secure chat preferred)    Dorothyann VEAR Maier 09/30/2024, 2:37 PM

## 2024-09-30 NOTE — Plan of Care (Signed)
  Problem: Education: Goal: Knowledge of General Education information will improve Description: Including pain rating scale, medication(s)/side effects and non-pharmacologic comfort measures Outcome: Progressing   Problem: Health Behavior/Discharge Planning: Goal: Ability to manage health-related needs will improve Outcome: Progressing   Problem: Clinical Measurements: Goal: Ability to maintain clinical measurements within normal limits will improve Outcome: Progressing Goal: Will remain free from infection Outcome: Progressing Goal: Diagnostic test results will improve Outcome: Progressing Goal: Respiratory complications will improve Outcome: Progressing Goal: Cardiovascular complication will be avoided Outcome: Progressing   Problem: Activity: Goal: Risk for activity intolerance will decrease Outcome: Progressing   Problem: Nutrition: Goal: Adequate nutrition will be maintained Outcome: Progressing   Problem: Coping: Goal: Level of anxiety will decrease Outcome: Progressing   Problem: Elimination: Goal: Will not experience complications related to bowel motility Outcome: Progressing Goal: Will not experience complications related to urinary retention Outcome: Progressing   Problem: Pain Managment: Goal: General experience of comfort will improve and/or be controlled Outcome: Progressing   Problem: Safety: Goal: Ability to remain free from injury will improve Outcome: Progressing   Problem: Skin Integrity: Goal: Risk for impaired skin integrity will decrease Outcome: Progressing   Problem: Education: Goal: Knowledge of the prescribed therapeutic regimen will improve Outcome: Progressing   Problem: Clinical Measurements: Goal: Usual level of consciousness will be regained or maintained. Outcome: Progressing Goal: Neurologic status will improve Outcome: Progressing Goal: Ability to maintain intracranial pressure will improve Outcome: Progressing    Problem: Skin Integrity: Goal: Demonstration of wound healing without infection will improve Outcome: Progressing

## 2024-09-30 NOTE — Progress Notes (Addendum)
 " PROGRESS NOTE  Ernest Romero FMW:[id number] DOB: 06/23/1966 DOA: 07/15/2024 PCP: [name]   LOS: 77 days   Brief narrative:  Patient is a 57 year old male with past medical history of alcohol use disorder, bipolar and panic disorder, initially presented to [facility] from jail in September 2025 with a large subdural hematoma.  Patient then underwent craniotomy with flap replacement on 10/27.  Since then multiple OR visits including 10/30 for SDH evacuation, follow-up CT head 11/8 showed fluid collection underneath the bone flap, back to the OR 11/10 and then bone flap was removed, cultures were obtained intraoperative growing MRSA, treating for infected bone flap.  Sequence of events 10/27 crani w/ flap replacement 10/30: repeat CT head with increased collection  10/31: OR  for SDH evacuation.  Developed arm twitching.  Klonopin  restarted at half dose 11/2 transferred out of ICU. 11/4 cortrak removed 11/8 CT head showed fluid collection under bone flap, decision made to remove bone flap 11/10 to OR for L bone flap removal 11/11 repeat CT head stable 11/29: notice to have worsening Dysphagia.  12/1 repeat CT head: stable.  12/3: Cortrack placed for nutrition, sutures removed by neurosurgery.  Tube feeds started. 12/5: Noted oriented but jaw tremors worse.  Evaluated by neurology, recommended psych to continue to follow and adjust medications. 12/17 -stable, eating fair, sitter at bedside. 12/20 - able to get out of bed, walking with sitter. 12/22 - finished IV valcomycin 6 weeks therapy. ID advised oral doxy for 2 more weeks from 12/23. 12/23 sitter discontinued, he is doing well. Placement pending. 12/24 IV ativan  changed to oral. He has jaw tremors. 12/26 tele sitter for safety ordered.     Assessment/Plan: Principal Problem:   Subdural hematoma (HCC) Active Problems:   Bipolar disorder (HCC)   Unable to make decisions about medical treatment due to  impaired mental capacity   Alcohol withdrawal (HCC)   Essential hypertension   Protein-calorie malnutrition, severe   Acute metabolic encephalopathy   Infection of craniotomy plate  Subdural hematoma /  Status post initial craniotomy 9/28  Status post skull flap left cranioplasty 10/27 ( [name]) Complicated by MRSA infected bone flap status post removal 11/10. Patient was seen by infectious disease and has completed 6 weeks course of IV vancomycin  on 09/23/2024. ID has recommended to continue oral doxy for 2 more weeks from 12/23. Completed 1 week of Keppra .    Acute metabolic encephalopathy At baseline, patient was independent with no cognitive deficits.  During hospitalization, patient was seen by psychiatry and is being adjusted on medications.   Currently on Klonopin .  Has been needing recruitment consultant.   Bipolar disorder/akathisia/drug-induced parkinsonism: Patient was on Haldol  and Latuda , developed facial tremors 11/20 concern for tardive dyskinesia. EEG negative for seizures. Latuda  and Haldol  was discontinued. Patient was started on propranolol  and Klonopin .  On Ingrezza .    Dysphagia Evaluated by speech. MBS: signs of aspiration..  Patient is alert awake and oriented.  Cortrak tube  was removed on 09/09/2024.  Tolerating diet and meds.  On dysphagia 3 diet.   Aspiration PNA with Acute hypoxic Resp failure.   In the setting of dysphagia and aspiration.  Completed 5-day course of Unasyn .  Continue aspiration precautions.   History of alcohol, tobacco use disorder Alcohol withdrawal: resolved. Continue thiamine , folic acid  and multivitamin.  On phenobarbital  taper.   Essential hypertension: On decreased dose of propranolol .  Amlodipine  and losartan  has been discontinued.  Constipation: Continue senna, colace,  laxatives.    Bradycardia Current continue with holding parameters on for propranolol     Severe malnutrition: Nutrition Status: Nutrition Problem: Severe  Malnutrition Etiology: acute illness Signs/Symptoms: severe muscle depletion, moderate muscle depletion Interventions: Ensure Enlive (each supplement provides 350kcal and 20 grams of protein), MVI  Continue ensure. Tolerating diet well.   Iron deficiency anemia; continue iron supplement.   DVT prophylaxis: heparin  injection 5,000 Units Start: 08/13/24 1400 SCDs Start: 08/12/24 1150 Place TED hose Start: 08/12/24 1150 SCDs Start: 08/01/24 1745 Place and maintain sequential compression device Start: 07/29/24 1819   Disposition: Difficult disposition.  Likely to skilled nursing facility.  TOC on board.  Status is: Inpatient Remains inpatient appropriate because: Difficult disposition, pending placement    Code Status:     Code Status: Full Code  Family Communication: None at bedside. Spoke with the patient;'s friend  [name] and updated him about the clinical condition of the patient.  Consultants: Psychiatry Infectious disease ID  Anti-infectives:  Doxycycline  p.o.   Subjective: Today, patient was seen and examined at bedside.  Patient states that he feels okay.  Denies any nausea, vomiting fever chills or shortness of breath.   Objective: Vitals:   09/30/24 0430 09/30/24 0826  BP: 98/73 110/80  Pulse: (!) 52 (!) 58  Resp: 17 18  Temp: 97.9 F (36.6 C) 98.5 F (36.9 C)  SpO2: 96% 99%    Intake/Output Summary (Last 24 hours) at 09/30/2024 1043 Last data filed at 09/30/2024 0507 Gross per 24 hour  Intake 500 ml  Output --  Net 500 ml   Filed Weights   09/27/24 0449 09/28/24 0500 09/30/24 0459  Weight: 67.3 kg 69.5 kg 69.8 kg   Body mass index is 22.08 kg/m.   Physical Exam:  GENERAL: Patient is mildly somnolent.  Communicative.  Not in obvious distress.  Depressed scalp on the left side. HENT: No scleral pallor or icterus. Pupils equally reactive to light. Oral mucosa is moist NECK: is supple, no gross swelling noted. CHEST:  No crackles or wheezes.   Diminished breath sounds bilaterally. CVS: S1 and S2 heard, no murmur. Regular rate and rhythm.  ABDOMEN: Soft, non-tender, bowel sounds are present. EXTREMITIES: No edema. Moves all the extremities. CNS: Cranial nerves are intact. Generalized weakness noted.  Moves all extremities, mild tremors noted. SKIN: warm and dry without rashes.  Data Review: I have personally reviewed the following laboratory data and studies,  CBC: Recent Labs  Lab 09/30/24 0658  WBC 5.0  HGB 14.7  HCT 45.5  MCV 91.9  PLT 220   Basic Metabolic Panel: No results for input(s): NA, K, CL, CO2, GLUCOSE, BUN, CREATININE, CALCIUM, MG, PHOS in the last 168 hours. Liver Function Tests: No results for input(s): AST, ALT, ALKPHOS, BILITOT, PROT, ALBUMIN  in the last 168 hours. No results for input(s): LIPASE, AMYLASE in the last 168 hours. No results for input(s): AMMONIA in the last 168 hours. Cardiac Enzymes: No results for input(s): CKTOTAL, CKMB, CKMBINDEX, TROPONINI in the last 168 hours. BNP (last 3 results) No results for input(s): BNP in the last 8760 hours.  ProBNP (last 3 results) No results for input(s): PROBNP in the last 8760 hours.  CBG: Recent Labs  Lab 09/25/24 0807 09/25/24 1142 09/25/24 1652 09/25/24 1926  GLUCAP 105* 106* 107* 110*   No results found for this or any previous visit (from the past 240 hours).   Studies: No results found.    Vernal Alstrom, MD  [facility] 09/30/2024  If  7PM-7AM, please contact night-coverage             "

## 2024-09-30 NOTE — TOC Progression Note (Addendum)
 Transition of Care [facility]) - Progression Note    Patient Details  Name: Ernest Romero MRN: [id number] Date of Birth: [birth year]  [facility] CM/SW Contact  [name], [place] Phone Number: 09/30/2024, 11:25 AM  Clinical Narrative:     Update 3:31 PM: CSW reached out to Nat Chang to provide update on placement; patient continues to not have bed offers. CSW left VM and callback number for Nat to reach out when next available.   CSW sent patient referral to [email or web address] for placement at [facility]. Patient currently does not have any bed offers through the HUB. CSW will continue to follow and update DC plan.  Order Authorizing Protective Services was served to the unit, filed September 18, 2024 with [facility] of [address]. [facility] Health and [employer] is authorized to provide and/or consent to services outlined in the paperwork. A copy of the order is in patient's hard chart.   Expected Discharge Plan: Skilled Nursing Facility Barriers to Discharge: Continued Medical Work up, English As A Second Language Teacher, SNF Pending bed offer               Expected Discharge Plan and Services In-house Referral: Clinical Social Work Discharge Planning Services: CM Consult                                           Social Drivers of Health (SDOH) Interventions SDOH Screenings   Food Insecurity: Patient Unable To Answer (07/16/2024)  Housing: Unknown (07/16/2024)  Transportation Needs: Patient Unable To Answer (07/16/2024)  Utilities: Patient Unable To Answer (07/16/2024)  Alcohol Screen: Low Risk (08/30/2022)  Depression (PHQ2-9): Medium Risk (08/30/2022)  Tobacco Use: Medium Risk (08/12/2024)    Readmission Risk Interventions    07/16/2024   12:23 PM  Readmission Risk Prevention Plan  Transportation Screening Complete  Medication Review (RN Care Manager) Complete  PCP  or Specialist appointment within 3-5 days of discharge Complete  HRI or Home Care Consult Complete  SW Recovery Care/Counseling Consult Complete  Palliative Care Screening Not Applicable  Skilled Nursing Facility Not Applicable

## 2024-09-30 NOTE — Plan of Care (Signed)
" °  Problem: Education: Goal: Knowledge of General Education information will improve Description: Including pain rating scale, medication(s)/side effects and non-pharmacologic comfort measures Outcome: Progressing   Problem: Health Behavior/Discharge Planning: Goal: Ability to manage health-related needs will improve Outcome: Progressing   Problem: Clinical Measurements: Goal: Ability to maintain clinical measurements within normal limits will improve Outcome: Progressing Goal: Will remain free from infection Outcome: Progressing Goal: Diagnostic test results will improve Outcome: Progressing Goal: Respiratory complications will improve Outcome: Progressing Goal: Cardiovascular complication will be avoided Outcome: Progressing   Problem: Activity: Goal: Risk for activity intolerance will decrease Outcome: Progressing   Problem: Coping: Goal: Level of anxiety will decrease Outcome: Progressing   Problem: Elimination: Goal: Will not experience complications related to bowel motility Outcome: Progressing Goal: Will not experience complications related to urinary retention Outcome: Progressing   Problem: Pain Managment: Goal: General experience of comfort will improve and/or be controlled Outcome: Progressing   Problem: Skin Integrity: Goal: Demonstration of wound healing without infection will improve Outcome: Progressing   "

## 2024-10-01 MED ORDER — ENOXAPARIN SODIUM 40 MG/0.4ML IJ SOSY
40.0000 mg | PREFILLED_SYRINGE | Freq: Every day | INTRAMUSCULAR | Status: AC
  Administered 2024-10-01 – 2024-11-08 (×35): 40 mg via SUBCUTANEOUS
  Filled 2024-10-01 (×39): qty 0.4

## 2024-10-01 NOTE — Plan of Care (Signed)
" °  Problem: Education: Goal: Knowledge of General Education information will improve Description: Including pain rating scale, medication(s)/side effects and non-pharmacologic comfort measures Outcome: Progressing   Problem: Health Behavior/Discharge Planning: Goal: Ability to manage health-related needs will improve Outcome: Progressing   Problem: Clinical Measurements: Goal: Ability to maintain clinical measurements within normal limits will improve Outcome: Progressing Goal: Will remain free from infection Outcome: Progressing Goal: Diagnostic test results will improve Outcome: Progressing Goal: Respiratory complications will improve Outcome: Progressing Goal: Cardiovascular complication will be avoided Outcome: Progressing   Problem: Activity: Goal: Risk for activity intolerance will decrease Outcome: Progressing   Problem: Nutrition: Goal: Adequate nutrition will be maintained Outcome: Progressing   Problem: Coping: Goal: Level of anxiety will decrease Outcome: Progressing   Problem: Elimination: Goal: Will not experience complications related to bowel motility Outcome: Progressing Goal: Will not experience complications related to urinary retention Outcome: Progressing   Problem: Pain Managment: Goal: General experience of comfort will improve and/or be controlled Outcome: Progressing   Problem: Safety: Goal: Ability to remain free from injury will improve Outcome: Progressing   Problem: Skin Integrity: Goal: Risk for impaired skin integrity will decrease Outcome: Progressing   Problem: Skin Integrity: Goal: Risk for impaired skin integrity will decrease Outcome: Progressing   Problem: Education: Goal: Knowledge of the prescribed therapeutic regimen will improve Outcome: Progressing   Problem: Clinical Measurements: Goal: Usual level of consciousness will be regained or maintained. Outcome: Progressing Goal: Neurologic status will improve Outcome:  Progressing Goal: Ability to maintain intracranial pressure will improve Outcome: Progressing   Problem: Skin Integrity: Goal: Demonstration of wound healing without infection will improve Outcome: Progressing   "

## 2024-10-01 NOTE — Progress Notes (Signed)
 " PROGRESS NOTE  Ernest Romero FMW:[id number] DOB: 09/05/1966 DOA: 07/15/2024 [facility]: [name]   LOS: 78 days   Brief narrative:  Patient is a 58 year old male with past medical history of alcohol use disorder, bipolar and panic disorder, initially presented to [facility] from jail in September 2025 with a large subdural hematoma.  Patient then underwent craniotomy with flap replacement on 10/27.  Since then multiple OR visits including 10/30 for SDH evacuation, follow-up CT head 11/8 showed fluid collection underneath the bone flap, back to the OR 11/10 and then bone flap was removed, cultures were obtained intraoperative growing MRSA, treating for infected bone flap.  Sequence of events 10/27 crani w/ flap replacement 10/30: repeat CT head with increased collection  10/31: OR  for SDH evacuation.  Developed arm twitching.  Klonopin  restarted at half dose 11/2 transferred out of ICU. 11/4 cortrak removed 11/8 CT head showed fluid collection under bone flap, decision made to remove bone flap 11/10 to OR for L bone flap removal 11/11 repeat CT head stable 11/29: notice to have worsening Dysphagia.  12/1 repeat CT head: stable.  12/3: Cortrack placed for nutrition, sutures removed by neurosurgery.  Tube feeds started. 12/5: Noted oriented but jaw tremors worse.  Evaluated by neurology, recommended psych to continue to follow and adjust medications. 12/17 -stable, eating fair, sitter at bedside. 12/20 - able to get out of bed, walking with sitter. 12/22 - finished IV valcomycin 6 weeks therapy. ID advised oral doxy for 2 more weeks from 12/23. 12/23 sitter discontinued, he is doing well. Placement pending. 12/24 IV ativan  changed to oral. He has jaw tremors. 12/26 tele sitter for safety ordered.     Assessment/Plan: Principal Problem:   Subdural hematoma (HCC) Active Problems:   Bipolar disorder (HCC)   Unable to make decisions about medical treatment due to  impaired mental capacity   Alcohol withdrawal (HCC)   Essential hypertension   Protein-calorie malnutrition, severe   Acute metabolic encephalopathy   Infection of craniotomy plate  Subdural hematoma /  Status post initial craniotomy 9/28  Status post skull flap left cranioplasty 10/27 ( [name]) Complicated by MRSA infected bone flap status post removal 11/10. Patient was seen by infectious disease and has completed 6 weeks course of IV vancomycin  on 09/23/2024. ID has recommended to continue oral doxy for 2 more weeks from 12/23. Completed 1 week of Keppra .    Acute metabolic encephalopathy At baseline, patient was independent with no cognitive deficits.  During hospitalization, patient was seen by psychiatry and is being adjusted on medications.   Currently on Klonopin .  Has been needing safety sitter intermittently due to incoordination during walking and possibility of falling.   Bipolar disorder/akathisia/drug-induced parkinsonism: Patient was on Haldol  and Latuda , developed facial tremors 11/20 concern for tardive dyskinesia. EEG negative for seizures. Latuda  and Haldol  was discontinued. Patient was started on propranolol  and Klonopin .  On Ingrezza .    Dysphagia Evaluated by speech. MBS: Showed signs of aspiration..  Patient is alert awake and oriented.  Cortrak tube  was removed on 09/09/2024.  Tolerating diet and meds.  On dysphagia 3 diet.  Currently no stable.   Aspiration PNA with Acute hypoxic Resp failure.   In the setting of dysphagia and aspiration.  Completed 5-day course of Unasyn .  Continue aspiration precautions.   History of alcohol, tobacco use disorder Alcohol withdrawal: resolved. Continue thiamine , folic acid  and multivitamin.  On phenobarbital  taper.   Essential hypertension: On decreased  dose of propranolol .  Amlodipine  and losartan  has been discontinued.  Constipation: Continue senna, colace, laxatives.    Bradycardia Current continue with holding  parameters on for propranolol     Severe malnutrition: Nutrition Status: Nutrition Problem: Severe Malnutrition Etiology: acute illness Signs/Symptoms: severe muscle depletion, moderate muscle depletion, percent weight loss Percent weight loss: 15 % (in 3 months) Interventions: Ensure Enlive (each supplement provides 350kcal and 20 grams of protein), MVI, Magic cup, Refer to RD note for recommendations  Continue ensure. Tolerating diet well.   Iron deficiency anemia; continue iron supplement.   DVT prophylaxis: enoxaparin  (LOVENOX ) injection 40 mg Start: 10/01/24 1400 Place TED hose Start: 08/12/24 1150 SCDs Start: 08/01/24 1745 Place and maintain sequential compression device Start: 07/29/24 1819   Disposition:  Likely to skilled nursing facility.  TOC on board.  Medically stable for disposition  Status is: Inpatient Remains inpatient appropriate because: Difficult disposition, pending placement    Code Status:     Code Status: Full Code  Family Communication: . Spoke with the patient;'s friend  [name] on 09/30/2024  Consultants: Psychiatry Infectious disease ID  Anti-infectives:  Doxycycline  p.o.   Subjective: Today, patient was seen and examined at bedside.  Patient denies interval complaints.  Denies nausea vomiting shortness of breath or dyspnea.  Sitter at bedside   Objective: Vitals:   10/01/24 0421 10/01/24 0828  BP: 113/77 112/78  Pulse: (!) 55 60  Resp:  19  Temp: (!) 97.4 F (36.3 C) 97.8 F (36.6 C)  SpO2: 97% 97%    Intake/Output Summary (Last 24 hours) at 10/01/2024 1029 Last data filed at 10/01/2024 0834 Gross per 24 hour  Intake 870 ml  Output --  Net 870 ml   Filed Weights   09/28/24 0500 09/30/24 0459 10/01/24 0500  Weight: 69.5 kg 69.8 kg 68.8 kg   Body mass index is 21.76 kg/m.   Physical Exam:  General:  Average built, not in obvious distress, depressed discomfort on the left side, Communicative, HENT:   No scleral pallor  or icterus noted. Oral mucosa is moist.  Chest: Diminished breath sounds bilaterally. CVS: S1 &S2 heard. No murmur.  Regular rate and rhythm. Abdomen: Soft, nontender, nondistended.  Bowel sounds are heard.   Extremities: No cyanosis, clubbing or edema.  Peripheral pulses are palpable. Psych: Alert, awake and oriented, normal mood CNS:  No cranial nerve deficits.  Moves all extremities.  Mild tremors noted. Skin: Warm and dry.  No rashes noted.   Data Review: I have personally reviewed the following laboratory data and studies,  CBC: Recent Labs  Lab 09/30/24 0658  WBC 5.0  HGB 14.7  HCT 45.5  MCV 91.9  PLT 220   Basic Metabolic Panel: Recent Labs  Lab 09/30/24 1046  NA 141  K 4.4  CL 107  CO2 24  GLUCOSE 117*  BUN 23*  CREATININE 0.97  CALCIUM 9.8  MG 2.1  PHOS 3.8   Liver Function Tests: No results for input(s): AST, ALT, ALKPHOS, BILITOT, PROT, ALBUMIN  in the last 168 hours. No results for input(s): LIPASE, AMYLASE in the last 168 hours. No results for input(s): AMMONIA in the last 168 hours. Cardiac Enzymes: No results for input(s): CKTOTAL, CKMB, CKMBINDEX, TROPONINI in the last 168 hours. BNP (last 3 results) No results for input(s): BNP in the last 8760 hours.  ProBNP (last 3 results) No results for input(s): PROBNP in the last 8760 hours.  CBG: Recent Labs  Lab 09/25/24 0807 09/25/24 1142 09/25/24 1652 09/25/24  1926  GLUCAP 105* 106* 107* 110*   No results found for this or any previous visit (from the past 240 hours).   Studies: No results found.    Vernal Alstrom, MD  [facility] 10/01/2024  If 7PM-7AM, please contact night-coverage         "

## 2024-10-01 NOTE — Progress Notes (Signed)
 Speech Language Pathology Treatment: Dysphagia;Cognitive-Linguistic  Patient Details Name: Ernest Romero MRN: [id number] DOB: 1965/12/26 Today's Date: 10/01/2024 Time: 9050-8947 SLP Time Calculation (min) (ACUTE ONLY): 63 min  Assessment / Plan / Recommendation Clinical Impression  Session focused on cognitive reassessment and goal updates.  Pt participated in the Cognitstat.  He performed within the average range for orientation, attention, expressive/receptive language, calculations, and reasoning (judgment/identifying similarities.) Performance in component of memory was impaired (9/12).  When asked about his performance, he identified memory as the area in which he had most difficulty.  He was surprised at how well he did with mental calculations.  Online awareness continues to be an area requiring more intervention.    We identified new goals:  Ernest Romero would like to be able to walk around his room independently; he agreed to ask PT about steps to gain more independence in his room.  We will work toward Ernest Romero being able to call and order his meals independently. He asked about finding some clothes to afford him some dignity.   Swallowing: He is eating well per RD notes; continues to cough frequently when eating.  There is oral holding of POs and intermittent delays in continuity of chewing/swallowing.  He has not developed respiratory complications - will continue to follow for swallowing.  No change in oral diet recommended.  SLP will follow.   HPI HPI: Ernest Romero is a 58 y.o. male who was admitted to [name] 06/30/24 after an unresponsive episode in jail. Dx large SDH; underwent left frontoparietal craniotomy with evacuation of SDH and drain placement 9/28 (drain removed 9/29). Transferred to [facility] for management of crani/flap 10/14. Cranioplasty with bone flap replacement 10/27; worsening neuro with midline shift; underwent  left burr hole for evacuation 10/30. Bone flap removal on 11/10.  Bedside  swallow eval 10/28 after change in mentation and acute change in swallowing; NPO, then advanced to dysphagia 3/thin liquids 10/31.  New orders 11/18 due to coughing with POs - rec continued Dys3/thin liquids. Repeat orders 11/28 due to acute change again in swallow. MBS completed on 11/29 - severe oropharyngeal dysphagia with aspiration of all liquids and puree solids; rec NPO. Repeat MBS 12/4- no significant change. Cortrak placed. [facility] 12/1 with evolving postoperative changes from left sided hemicraniectomy and bone flap removal without evidence of an acute intracranial abnormality or significant residual subdural collection. MBS 12/8 rec Dys3/thin with L headturn      SLP Plan  Continue with current plan of care;Goals updated        Swallow Evaluation Recommendations   Recommendations: PO diet PO Diet Recommendation: Dysphagia 3 (Mechanical soft);Thin liquids (Level 0) Liquid Administration via: Cup;Straw Medication Administration: Whole meds with puree Supervision: Patient able to self-feed Postural changes: Position pt fully upright for meals Oral care recommendations: Oral care QID (4x/day)     Recommendations                     Oral care QID   Frequent or constant Supervision/Assistance Cognitive communication deficit (R41.841)     Continue with current plan of care;Goals updated    Ernest Romero L. Vona, MA CCC/SLP Clinical Specialist - Acute Care SLP Acute Rehabilitation Services Office number [phone]  [name]  10/01/2024, 11:01 AM

## 2024-10-01 NOTE — Progress Notes (Addendum)
 Nutrition Follow-up  DOCUMENTATION CODES:  Severe malnutrition in context of acute illness/injury  INTERVENTION:  Continue Dysphagia 3 diet with thin liquids. Continue Magic Cup TID. Each supplement provides 290 Kcals and 9 grams of protein. Continue Ensure Plus High Protein PO BID. Each supplement provides 350 Kcals and 20 grams of protein. Continue multivitamin, thiamine , iron, and folic acid  daily. Continue mirtazapine . RD will follow-up monthly. Please send consult if needed.  NUTRITION DIAGNOSIS:  Severe Malnutrition related to acute illness as evidenced by severe muscle depletion, moderate muscle depletion, percent weight loss - remains applicable  GOAL:  Patient will meet greater than or equal to 90% of their needs - currently being met with consistently good PO intake, continue to monitor  MONITOR:  PO intake, Supplement acceptance, Diet advancement, Labs, Weight trends  REASON FOR ASSESSMENT:  Follow-up for: Malnutrition Screening Tool, NPO/Clear Liquid Diet    ASSESSMENT:  PMH significant for ETOH abuse, HTN, bipolar disorder, anxiety/depression, hepatitis, craniotomy 06/30/24 for SDH during incarceration, presented on 10/13 for management of craniotomy s/p craniotomy with flap replacement 10/27.  09/28 admitted to [facility] with SDH s/p post prontoparietal craniotomy with evacuation 10/13 transferred to [facility] 10/27 s/p L allograft cranioplasty with flap replacement 10/29 Cortrak tube placed; xray with tip in descending duodenum  10/31 OR for SDH evacuation; diet advanced to Dysphagia 3; eating 75% of meals 11/02 Tube feeds discontinued  11/03 Transferred to progressive  11/04 Cortrak removed 11/10 OR for L bone flap removal, transferred to ICU 11/11 Transferred back to progressive  11/12 Continues to eat 75-100% meals, SLP passed patient for regular solids but patient prefers bite-sized Dys3 diet 11/25 Continued >75% meal intake charted 11/28 Diet changed to NPO due to  coughing with medications 11/29 Failed MBS, SLP recommends continued NPO 12/01 Continues to fail swallow, NPO continues, repeat CT head due to increased drowsiness - results benign 12/02 Failed SLP eval again 12/03 Failed repeat MBS; Cortrak placed 12/08 Cortrak removed. Repeat MBS - SLP suspected Cortrak affecting patient's swallow, patient insists on resuming PO anyway. Dys 3, thin liquids. Patient has great appetite. 12/23 sitter discontinued 12/24 patient with jaw tremors, IV ativan  changed to PO 12/26 tele sitter ordered for safety  Discharge still pending SNF bed availability. The patient continues to eat well. D/W RN.  Scheduled Meds:  benztropine   1 mg Oral BID   Chlorhexidine  Gluconate Cloth  6 each Topical Daily   clonazePAM   0.5 mg Oral TID   docusate  100 mg Oral Daily   doxycycline   100 mg Oral Q12H   feeding supplement  237 mL Oral BID BM   ferrous sulfate   325 mg Oral Q breakfast   folic acid   1 mg Oral Daily   gabapentin   200 mg Oral TID   heparin  injection (subcutaneous)  5,000 Units Subcutaneous Q8H   mirtazapine   15 mg Oral QHS   multivitamin with minerals  1 tablet Oral Daily   nicotine   14 mg Transdermal Daily   pantoprazole   40 mg Oral QHS   polyethylene glycol  17 g Oral Daily   propranolol   20 mg Oral BID   senna  2 tablet Oral Daily   sodium chloride  flush  10-40 mL Intracatheter Q12H   thiamine   100 mg Oral Daily   valbenazine   40 mg Oral Daily    Diet Order             DIET DYS 3 Room service appropriate? Yes with Assist; Fluid consistency: Thin  Diet  effective now                  Meal Intake: Consistently 75-100%   Labs:     Latest Ref Rng & Units 09/30/2024   10:46 AM 09/18/2024    2:10 AM 09/13/2024    5:24 AM  CMP  Glucose 70 - 99 mg/dL 882  898  94   BUN 6 - 20 mg/dL 23  26  17    Creatinine 0.61 - 1.24 mg/dL 9.02  9.02  9.10   Sodium 135 - 145 mmol/L 141  142  140   Potassium 3.5 - 5.1 mmol/L 4.4  4.2  3.9   Chloride 98 -  111 mmol/L 107  107  108   CO2 22 - 32 mmol/L 24  26  27    Calcium 8.9 - 10.3 mg/dL 9.8  9.5  9.1     I/O: -2.3 L since admit  NUTRITION - FOCUSED PHYSICAL EXAM:  Flowsheet Row Most Recent Value  Orbital Region Mild depletion  Upper Arm Region Moderate depletion  Thoracic and Lumbar Region Mild depletion  Buccal Region Moderate depletion  Temple Region Moderate depletion  Clavicle Bone Region Severe depletion  Clavicle and Acromion Bone Region Severe depletion  Scapular Bone Region Moderate depletion  Dorsal Hand Mild depletion  Patellar Region Severe depletion  Anterior Thigh Region Severe depletion  Posterior Calf Region Severe depletion  Edema (RD Assessment) None  Hair Reviewed  Eyes Reviewed  Mouth Reviewed  Skin Reviewed  Nails Reviewed    EDUCATION NEEDS:  Education needs have been addressed  Skin:  Skin Assessment: Reviewed RN Assessment Skin Integrity Issues:: Incisions Incisions: -  Last BM:  12/29  Height:  Ht Readings from Last 1 Encounters:  08/12/24 5' 10 (1.778 m)   Weight:   Stable weight this admission with admit weight 67.5 Kg and current weight 68.8 Kg  Weight Change: 12 Kg (15%) loss in 3 months-clinically significant  Edema: none  Ideal Body Weight:  75.5 kg   BMI:  Body mass index is 21.76 kg/m.  Estimated Nutritional Needs:  Kcal:  2100-2400 Protein:  110-130 Fluid:  2100-2400    Leverne Ruth, MS, RDN, LDN  [facility]. [facility] See AMION for contact information Secure chat preferred

## 2024-10-02 NOTE — Progress Notes (Addendum)
 Physical Therapy Treatment Patient Details Name: Ernest Romero MRN: [id number] DOB: Jan 08, 1966 Today's Date: 10/02/2024   History of Present Illness 58 y.o. male presenting 07/16/24 from [facility] where he was admitted 06/30/24 for unresponsive episode in the jail. 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 On CIWA protocol. Transferred to [facility] for further management of craniotomy and flap. 10/27 s/p cranioplasty with bone flap replacement. 10/28 Change in status thought to be related to Klonopin  administration 10/28, repeat CTH shows significant increase in size of mixed attenuation subdural fluid collection underlying the L tempoparietal craniotomy site, now measuring approximately 15 mm in thickness, with associated mild mass effect and slight rightward midline shift. OR on 10/30 for SDH evacuation. [facility] 11/7 with fluid collected under bone flap. 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 Worsening dysphagia 11/29; repeat CTH stable 12/1. Cortrak placed 12/3-12/8. PMH includes alcohol use disorder, psychiatric disorder, HTN.    PT Comments  Pt seen for PT tx with pt agreeable, in handoff from OT. Session focused on gait training & high level balance training. Pt also completed Berg Balance Test scoring [phone]. Patient demonstrates increased fall risk as noted by score of 43/56 on Berg Balance Scale.  (<36= high risk for falls, close to 100%; 37-45 significant >80%; 46-51 moderate >50%; 52-55 lower >25%). Recommend ongoing PT treatment to address balance & gait to reduce fall risk & increase independence with mobility.  Pt's helmet loose around skull & does not improve with tightening chin strap - requested MD place order for smaller one for improved fit.   If plan is discharge home, recommend the following: Assistance with cooking/housework;Direct supervision/assist for medications management;Direct  supervision/assist for financial management;Assist for transportation;Help with stairs or ramp for entrance;Supervision due to cognitive status;A little help with walking and/or transfers;A little help with bathing/dressing/bathroom   Can travel by private vehicle     Yes  Equipment Recommendations  Rollator (4 wheels)    Recommendations for Other Services       Precautions / Restrictions Precautions Precautions: Fall Precaution/Restrictions Comments: SBP <160, helmet (pt to wear when OOB per RN) Restrictions Weight Bearing Restrictions Per Provider Order: No     Mobility  Bed Mobility                    Transfers Overall transfer level: Needs assistance Equipment used: None Transfers: Sit to/from Stand Sit to Stand: Supervision                Ambulation/Gait Ambulation/Gait assistance: Contact guard assist, Min assist, Supervision Gait Distance (Feet):  (>150 ft)   Gait Pattern/deviations: Decreased step length - right, Decreased step length - left, Decreased stride length, Decreased dorsiflexion - right, Decreased dorsiflexion - left Gait velocity: decreased     General Gait Details: Pt ambulates briefly with supervision, otherwise CGA, did require min assist x 1 2/2 LOB when looking down. Cuing to focus on increased dorsiflexion & heel strike BLE with good<>fair return demo. Pt also engaged in gait with head turns (most affect on balance when looking down), speed changes, retrograde gait with CGA<>min assist. Slight retrograde lean with upper body during gait.   Stairs             Wheelchair Mobility     Tilt Bed    Modified Rankin (Stroke Patients Only)       Balance  Standardized Balance Assessment Standardized Balance Assessment : Berg Balance Test Berg Balance Test Sit to Stand: Able to stand without using hands and stabilize independently Standing Unsupported: Able to stand safely 2  minutes Sitting with Back Unsupported but Feet Supported on Floor or Stool: Able to sit safely and securely 2 minutes Stand to Sit: Sits safely with minimal use of hands Transfers: Able to transfer safely, minor use of hands Standing Unsupported with Eyes Closed: Able to stand 10 seconds safely Standing Ubsupported with Feet Together: Able to place feet together independently and stand 1 minute safely From Standing, Reach Forward with Outstretched Arm: Can reach forward >12 cm safely (5) From Standing Position, Pick up Object from Floor: Able to pick up shoe safely and easily From Standing Position, Turn to Look Behind Over each Shoulder: Needs assist to keep from losing balance and falling Turn 360 Degrees: Needs close supervision or verbal cueing Standing Unsupported, Alternately Place Feet on Step/Stool: Able to complete >2 steps/needs minimal assist Standing Unsupported, One Foot in Front: Able to plae foot ahead of the other independently and hold 30 seconds Standing on One Leg: Able to lift leg independently and hold 5-10 seconds (stands on R LE, close supervision but no assist) Total Score: 43        Communication Communication Communication: Impaired Factors Affecting Communication: Difficulty expressing self;Reduced clarity of speech  Cognition Arousal: Alert Behavior During Therapy: Flat affect   PT - Cognitive impairments: Memory, Problem solving, Safety/Judgement, Sequencing, Awareness, Attention                   Rancho Levels of Cognitive Functioning Rancho Los Amigos Scales of Cognitive Functioning: Automatic, Appropriate: Minimal Assistance for Daily Living Skills Rancho Los Amigos Scales of Cognitive Functioning: Automatic, Appropriate: Minimal Assistance for Daily Living Skills [VII] PT - Cognition Comments: Pt with lower jaw tremor, aware & asking for meds. Able to verbalize when he needs rest breaks, able to recall need to wear helmet with questioning cuing  when OOB. Following commands: Impaired Following commands impaired: Follows multi-step commands with increased time, Follows multi-step commands inconsistently    Cueing    Exercises Other Exercises Other Exercises: Pt noted to have large suture in anterior aspect of head, as well as slightly farther back (almost embedded into the skin) & one near ear (pt able to locate this one, that's also deep in the skin & covered by hair). Charge nurse, nurse & MD made aware. Charge nurse observed.    General Comments        Pertinent Vitals/Pain Pain Assessment Pain Assessment: No/denies pain    Home Living                          Prior Function            PT Goals (current goals can now be found in the care plan section) Acute Rehab PT Goals Patient Stated Goal: none stated PT Goal Formulation: With patient Time For Goal Achievement: 10/14/24 Potential to Achieve Goals: Fair Progress towards PT goals: Progressing toward goals    Frequency    Min 2X/week      PT Plan      Co-evaluation              AM-PAC PT 6 Clicks Mobility   Outcome Measure  Help needed turning from your back to your side while in a flat bed without using bedrails?: None Help needed moving from  lying on your back to sitting on the side of a flat bed without using bedrails?: None Help needed moving to and from a bed to a chair (including a wheelchair)?: A Little Help needed standing up from a chair using your arms (e.g., wheelchair or bedside chair)?: None Help needed to walk in hospital room?: A Little Help needed climbing 3-5 steps with a railing? : A Little 6 Click Score: 21    End of Session Equipment Utilized During Treatment: Gait belt Activity Tolerance: Patient tolerated treatment well Patient left: in bed;with nursing/sitter in room;with call bell/phone within reach Nurse Communication:  (sutures in head) PT Visit Diagnosis: Other symptoms and signs involving the nervous  system (R29.898);Muscle weakness (generalized) (M62.81);Unsteadiness on feet (R26.81);Other abnormalities of gait and mobility (R26.89)     Time: 8493-8468 PT Time Calculation (min) (ACUTE ONLY): 25 min  Charges:    $Gait Training: 8-22 mins $Therapeutic Activity: 8-22 mins PT General Charges $$ ACUTE PT VISIT: 1 Visit                     Richerd Pinal, PT, DPT 10/02/2024, 3:56 PM   Richerd CHRISTELLA Pinal 10/02/2024, 3:56 PM

## 2024-10-02 NOTE — TOC Progression Note (Signed)
 Transition of Care [facility]) - Progression Note    Patient Details  Name: Ernest Romero MRN: [id number] Date of Birth: 1966-09-11  [facility] of [facility]) CM/SW Contact  [name], [place] Phone Number: 10/02/2024, 3:52 PM  Clinical Narrative:     Patient received letter from the court stating patient is a disabled adult who needs protective services involvement. This decision was made and filed on September 18, 2024. CSW placed letter in patient's hard chart.  There are currently no new bed offers for patient through the [facility]. CSW will continue to follow.   Expected Discharge Plan: Skilled Nursing Facility Barriers to Discharge: Continued Medical Work up, English As A Second Language Teacher, SNF Pending bed offer               Expected Discharge Plan and Services In-house Referral: Clinical Social Work Discharge Planning Services: CM Consult                                           Social Drivers of Health (SDOH) Interventions SDOH Screenings   Food Insecurity: Patient Unable To Answer (07/16/2024)  Housing: Unknown (07/16/2024)  Transportation Needs: Patient Unable To Answer (07/16/2024)  Utilities: Patient Unable To Answer (07/16/2024)  Alcohol Screen: Low Risk (08/30/2022)  Depression (PHQ2-9): Medium Risk (08/30/2022)  Tobacco Use: Medium Risk (08/12/2024)    Readmission Risk Interventions    07/16/2024   12:23 PM  Readmission Risk Prevention Plan  Transportation Screening Complete  Medication Review (RN Care Manager) Complete  PCP or Specialist appointment within 3-5 days of discharge Complete  HRI or Home Care Consult Complete  SW Recovery Care/Counseling Consult Complete  Palliative Care Screening Not Applicable  Skilled Nursing Facility Not Applicable

## 2024-10-02 NOTE — Progress Notes (Signed)
 Occupational Therapy Treatment Patient Details Name: Ernest Romero MRN: [id number] DOB: [birth year] Today's Date: 10/02/2024   History of present illness 58 y.o. male presenting 07/16/24 from [facility] where he was admitted 06/30/[facility]. 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 On CIWA protocol. Transferred to [facility] for further management of craniotomy and flap. 10/27 s/p cranioplasty with bone flap replacement. 10/28 Change in status thought to be related to Klonopin  administration 10/28, repeat CTH shows significant increase in size of mixed attenuation subdural fluid collection underlying the L tempoparietal craniotomy site, now measuring approximately 15 mm in thickness, with associated mild mass effect and slight rightward midline shift. OR on 10/30 for SDH evacuation. [facility] 11/7 with fluid collected under bone flap. 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 Worsening dysphagia 11/29; repeat CTH stable 12/1. Cortrak placed 12/3-12/8. PMH includes alcohol use disorder, psychiatric disorder, HTN.   OT comments  Pt seen for OT tx this PM. Goals updated as reflected in OT POC. Pt making good gains towards OT goals today. Grossly supervision for all mobility, still requiring cognitive cueing for multi-step tasks (> 2 steps). Working civil service fast streamer and recall are improving. Benefits from prompting for problem solving. Worked on routine-building in preparation for self-independence in his hospital room - try sticky notes or routine checklist for visual cues next visit.   Observed pt's helmet too big, MD contacted to order pt a better fitting helmet. Current recommendations remain appropriate, <3 hrs/day post-acute rehab. OT to continue to follow.      If plan is discharge home, recommend the following:  Supervision due to cognitive status;Direct supervision/assist for medications  management;Assistance with cooking/housework;Direct supervision/assist for financial management;Assist for transportation;Assistance with feeding;A little help with walking and/or transfers;Help with stairs or ramp for entrance;A little help with bathing/dressing/bathroom   Equipment Recommendations  BSC/3in1    Recommendations for Other Services      Precautions / Restrictions Precautions Precautions: Fall Recall of Precautions/Restrictions: Impaired Precaution/Restrictions Comments: SBP <160, helmet (pt to wear when OOB) Restrictions Weight Bearing Restrictions Per Provider Order: No       Mobility Bed Mobility Overal bed mobility: Needs Assistance Bed Mobility: Supine to Sit     Supine to sit: Supervision     General bed mobility comments: incr time to transition to EOB    Transfers Overall transfer level: Needs assistance Equipment used: None Transfers: Sit to/from Stand Sit to Stand: Supervision                 Balance Overall balance assessment: Mild deficits observed, not formally tested                                         ADL either performed or assessed with clinical judgement   ADL Overall ADL's : Needs assistance/impaired     Grooming: Standing;Wash/dry hands;Minimal assistance Grooming Details (indicate cue type and reason): cog sequencing cues for using soap and problem solving through what item to use to initiate face washing             Lower Body Dressing: Contact guard assist;Sitting/lateral leans;Sit to/from stand Lower Body Dressing Details (indicate cue type and reason): donned hospital paper scrub pants     Toileting- Clothing Manipulation and Hygiene: Supervision/safety;Sit to/from stand Therapist, Nutritional  Details (indicate cue type and reason): stood to urinate, able to manage clothing up/down appropriately     Functional mobility during ADLs: Supervision/safety;Cueing for safety       Extremity/Trunk Assessment              Vision       Perception     Praxis     Communication Communication Communication: Impaired Factors Affecting Communication: Difficulty expressing self;Reduced clarity of speech   Cognition Arousal: Alert Behavior During Therapy: Flat affect Cognition: Cognition impaired   Orientation impairments:  (stated the 31st) Awareness: Intellectual awareness intact, Online awareness impaired Memory impairment (select all impairments): Working civil service fast streamer, Short-term memory Attention impairment (select first level of impairment): Divided attention Executive functioning impairment (select all impairments): Organization, Sequencing, Reasoning, Problem solving OT - Cognition Comments: initially sleeping, awakens easily and pleasantly participatory with OT; recall and working memory con't to improve with time               [employer] of Cognitive Functioning: Automatic, Appropriate: Minimal Assistance for Daily Living Skills [VII] Following commands: Impaired Following commands impaired: Follows multi-step commands with increased time, Follows multi-step commands inconsistently      Cueing   Cueing Techniques: Verbal cues, Gestural cues  Exercises Exercises: Other exercises Other Exercises Other Exercises: Worked on prompting for routine building (ie listing steps to take in prepration for OOB activities - clearing pathway to destination, donning helmet, etc.).    Shoulder Instructions       General Comments NT sitter present for duration of session    Pertinent Vitals/ Pain       Pain Assessment Pain Assessment: No/denies pain  Home Living                                          Prior Functioning/Environment              Frequency  Min 2X/week        Progress Toward Goals  OT Goals(current goals can now be found in the care plan section)  Progress towards OT goals: Progressing  toward goals  Acute Rehab OT Goals Time For Goal Achievement: 10/16/24  Plan      Co-evaluation                 AM-PAC OT 6 Clicks Daily Activity     Outcome Measure   Help from another person eating meals?: None Help from another person taking care of personal grooming?: A Little Help from another person toileting, which includes using toliet, bedpan, or urinal?: A Little Help from another person bathing (including washing, rinsing, drying)?: A Little Help from another person to put on and taking off regular upper body clothing?: A Little Help from another person to put on and taking off regular lower body clothing?: A Little 6 Click Score: 19    End of Session Equipment Utilized During Treatment: Gait belt  OT Visit Diagnosis: Other abnormalities of gait and mobility (R26.89);Muscle weakness (generalized) (M62.81);Other symptoms and signs involving the nervous system (R29.898);Cognitive communication deficit (R41.841);Other symptoms and signs involving cognitive function Symptoms and signs involving cognitive functions:  (SDH s/p L frontoparietal craniotomy with evacuation of SDH)   Activity Tolerance Patient tolerated treatment well   Patient Left  (left in hallway ambulating with PT staff)   Nurse Communication Mobility status  Time: 8548-8491 OT Time Calculation (min): 17 min  Charges: OT General Charges $OT Visit: 1 Visit OT Treatments $Self Care/Home Management : 8-22 mins  Itali Mckendry M. [place], OTR/L [facility] [phone] Secure Chat Preferred  Kaydence Menard 10/02/2024, 4:41 PM

## 2024-10-02 NOTE — Progress Notes (Signed)
 "  PROGRESS NOTE    Ernest Romero  FMW:[id number] DOB: 08-05-1966 DOA: 07/15/2024 [facility]: [name]   Brief Narrative: Ernest Romero is a 58 y.o. male with a history of alcohol abuse, bipolar disorder, panic disorder.  Patient presented secondary to being found down and found to have evidence of a large subdural hematoma, undergoing a left frontoparietal craniotomy with evacuation of the subdural hematoma and placement of a drain and subsequent removal. Patient required further management of his craniotomy and flap, which necessitated transfer from [facility] to [facility], where neurosurgery performed a left-sided allograft cranioplasty. Repeat CT head was significant for worsening fluid, requiring a left frontal burr hole for evacuation on 10/30. 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 Patient underwent a left bone flap removal and was treated with IV antibiotics per infectious disease recommendations.   Assessment and Plan:  Recurrent subdural hematoma Patient underwent cranioplasty and burr hole procedures per neurosurgery. Per neurosurgery, likely that patient will have reaccumulation of fluid. No new neurologic changes.  MRSA infected bone flap Infectious disease consulted and patient treated with Vancomycin  IV for 6 weeks, completed on 12/22, with further recommendation to complete 2 weeks of doxycycline  PO. - Continue doxycycline   Acute metabolic encephalopathy Likely related to above. Patient's mental status is stable, but he is requiring a recruitment consultant intermittently.  Bipolar disorder - Continue Ingressa  Drug-induced parkinsonism Akathisia Likely related to use of antipsychotics. EEG obtained and was negative for evidence of seizure activity. Propranolol  and Klonopin  started, in addition to Ingressa. - Continue propranolol , Klonopin ,  Ingressa  Dysphagia Patient required an NG tube for tube feeds from 12/3 until 12/8. -SLP recommendations (12/30): PO Diet Recommendation: Dysphagia 3 (Mechanical soft);Thin liquids (Level 0) Liquid Administration via: Cup;Straw Medication Administration: Whole meds with puree Supervision: Patient able to self-feed Postural changes: Position pt fully upright for meals Oral care recommendations: Oral care QID (4x/day)  Primary hypertension - Continue propranolol , amlodipine  and losartan .  Sinus bradycardia Complicated by propranolol  use. Patient with decreased dose and hold parameters applied.  Severe malnutrition - Dietitian recommendations (12/30): Continue Dysphagia 3 diet with thin liquids. Continue Magic Cup TID. Each supplement provides 290 Kcals and 9 grams of protein. Continue Ensure Plus High Protein PO BID. Each supplement provides 350 Kcals and 20 grams of protein. Continue multivitamin, thiamine , iron, and folic acid  daily. Continue mirtazapine . RD will follow-up monthly. Please send consult if needed  Iron deficiency anemia - Continue ferrous sulfate   Aspiration pneumonia Treated with Unasyn  IV.  Acute respiratory failure with hypoxia Secondary to pneumonia. Resolved.  Alcohol use disorder Alcohol withdrawal Patient managed on CIWA with phenobarbital  taper. Withdrawal resolved. - Continue thiamine , folic acid  and multivitamin   DVT prophylaxis: Lovenox  Code Status:   Code Status: Full Code Family Communication: None at bedside Disposition Plan: Discharge pending bed availability   Consultants:  Psychiatry PM&R Neurology Infectious disease PCCM  Procedures:  Left-sided allograft cranioplasty Left frontal burr hole for evacuation of subdural hematoma Left bone flap removal Cortrak placement/removal  Antimicrobials: Vancomycin  Doxycycline     Subjective: Patient without concerns this morning.  Objective: BP 102/71 (BP Location: Right  Wrist)   Pulse 60   Temp 97.6 F (36.4 C) (Skin)   Resp 17   Ht 5' 10 (1.778 m)   Wt 72.1 kg   SpO2 98%   BMI 22.81 kg/m   Examination:  General exam: Appears calm and  comfortable. Respiratory system: Respiratory effort normal. Gastrointestinal system: Abdomen is nondistended, soft and nontender. Central nervous system: Alert.   Data Reviewed: I have personally reviewed following labs and imaging studies  CBC Lab Results  Component Value Date   WBC 5.0 09/30/2024   RBC 4.95 09/30/2024   HGB 14.7 09/30/2024   HCT 45.5 09/30/2024   MCV 91.9 09/30/2024   MCH 29.7 09/30/2024   PLT 220 09/30/2024   MCHC 32.3 09/30/2024   RDW 13.4 09/30/2024   LYMPHSABS 2.3 07/16/2024   MONOABS 0.7 07/16/2024   EOSABS 0.2 07/16/2024   BASOSABS 0.1 07/16/2024     Last metabolic panel Lab Results  Component Value Date   NA 141 09/30/2024   K 4.4 09/30/2024   CL 107 09/30/2024   CO2 24 09/30/2024   BUN 23 (H) 09/30/2024   CREATININE 0.97 09/30/2024   GLUCOSE 117 (H) 09/30/2024   GFRNONAA >60 09/30/2024   GFRAA >60 09/16/2018   CALCIUM 9.8 09/30/2024   PHOS 3.8 09/30/2024   PROT 6.1 (L) 08/28/2024   ALBUMIN  3.1 (L) 08/28/2024   BILITOT 0.7 08/28/2024   ALKPHOS 69 08/28/2024   AST 26 08/28/2024   ALT 40 08/28/2024   ANIONGAP 10 09/30/2024    GFR: Estimated Creatinine Clearance: 82.6 mL/min (by C-G formula based on SCr of 0.97 mg/dL).  No results found for this or any previous visit (from the past 240 hours).    Radiology Studies: No results found.    LOS: 79 days    Elgin Lam, MD [facility] 10/02/2024, 9:50 AM   If 7PM-7AM, please contact night-coverage [email or web address]  "

## 2024-10-02 NOTE — Progress Notes (Signed)
 This patient has been c/o feeling staples in his head.  Upon inspection, 3 stitches were found from surgery done in November.  On 09/04/24, neurosurgery removed his stitches and charted that all stitches were removed.  However, as mentioned, there are 3 stitches that are now imbedded in the patient's scalp. Per physician they will remove any stitches that were left over from when they were removed.

## 2024-10-03 NOTE — Plan of Care (Signed)
" °  Problem: Education: Goal: Knowledge of General Education information will improve Description: Including pain rating scale, medication(s)/side effects and non-pharmacologic comfort measures Outcome: Progressing   Problem: Skin Integrity: Goal: Risk for impaired skin integrity will decrease Outcome: Progressing   Problem: Skin Integrity: Goal: Demonstration of wound healing without infection will improve Outcome: Progressing   Problem: Safety: Goal: Ability to remain free from injury will improve Outcome: Not Progressing   "

## 2024-10-03 NOTE — Plan of Care (Signed)
" °  Problem: Nutrition: Goal: Adequate nutrition will be maintained Outcome: Progressing   Problem: Coping: Goal: Level of anxiety will decrease Outcome: Progressing   Problem: Safety: Goal: Ability to remain free from injury will improve Outcome: Progressing   Problem: Education: Goal: Knowledge of the prescribed therapeutic regimen will improve Outcome: Progressing   Problem: Clinical Measurements: Goal: Ability to maintain intracranial pressure will improve Outcome: Progressing   "

## 2024-10-03 NOTE — Progress Notes (Signed)
 "  PROGRESS NOTE    Ernest Romero  FMW:[id number] DOB: 09-18-1966 DOA: 07/15/2024 PCP: [name]   Brief Narrative: Ernest Romero is a 59 y.o. male with a history of alcohol abuse, bipolar disorder, panic disorder.  Patient presented secondary to being found down and found to have evidence of a large subdural hematoma, undergoing a left frontoparietal craniotomy with evacuation of the subdural hematoma and placement of a drain and subsequent removal. Patient required further management of his craniotomy and flap, which necessitated transfer [facility] to [facility], where neurosurgery performed a left-sided allograft cranioplasty. Repeat CT head was significant for worsening fluid, requiring a left frontal burr hole for evacuation on 10/30. 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 Patient underwent a left bone flap removal and was treated with IV antibiotics per infectious disease recommendations.   Assessment and Plan:  Recurrent subdural hematoma Patient underwent cranioplasty and burr hole procedures per neurosurgery. Per neurosurgery, likely that patient will have reaccumulation of fluid. No new neurologic changes.  MRSA infected bone flap Infectious disease consulted and patient treated with Vancomycin  IV for 6 weeks, completed on 12/22, with further recommendation to complete 2 weeks of doxycycline  PO. - Continue doxycycline   Acute metabolic encephalopathy Likely related to above. Patient's mental status is stable, but he is requiring a recruitment consultant intermittently.  Bipolar disorder - Continue Ingressa  Drug-induced parkinsonism Akathisia Likely related to use of antipsychotics. EEG obtained and was negative for evidence of seizure activity. Propranolol  and Klonopin  started, in addition to Ingressa. - Continue propranolol , Klonopin ,  Ingressa  Dysphagia Patient required an NG tube for tube feeds from 12/3 until 12/8. - SLP recommendations (12/30): PO Diet Recommendation: Dysphagia 3 (Mechanical soft);Thin liquids (Level 0) Liquid Administration via: Cup;Straw Medication Administration: Whole meds with puree Supervision: Patient able to self-feed Postural changes: Position pt fully upright for meals Oral care recommendations: Oral care QID (4x/day)  Primary hypertension - Continue propranolol , amlodipine  and losartan .  Sinus bradycardia Complicated by propranolol  use. Patient with decreased dose and hold parameters applied.  Severe malnutrition - Dietitian recommendations (12/30): Continue Dysphagia 3 diet with thin liquids. Continue Magic Cup TID. Each supplement provides 290 Kcals and 9 grams of protein. Continue Ensure Plus High Protein PO BID. Each supplement provides 350 Kcals and 20 grams of protein. Continue multivitamin, thiamine , iron, and folic acid  daily. Continue mirtazapine . RD will follow-up monthly. Please send consult if needed  Iron deficiency anemia - Continue ferrous sulfate   Aspiration pneumonia Treated with Unasyn  IV.  Acute respiratory failure with hypoxia Secondary to pneumonia. Resolved.  Alcohol use disorder Alcohol withdrawal Patient managed on CIWA with phenobarbital  taper. Withdrawal resolved. - Continue thiamine , folic acid  and multivitamin   DVT prophylaxis: Lovenox  Code Status:   Code Status: Full Code Family Communication: None at bedside Disposition Plan: Discharge pending bed availability   Consultants:  Psychiatry PM&R Neurology Infectious disease PCCM  Procedures:  Left-sided allograft cranioplasty Left frontal burr hole for evacuation of subdural hematoma Left bone flap removal Cortrak placement/removal  Antimicrobials: Vancomycin  Doxycycline     Subjective: No issues overnight. No concerns this morning.  Objective: BP 118/83 (BP Location:  Right Arm)   Pulse 60   Temp (!) 97.5 F (36.4 C)   Resp 18   Ht 5' 10 (1.778 m)   Wt 70.8 kg   SpO2 100%   BMI 22.40 kg/m   Examination:  General exam:  Appears calm and comfortable. Respiratory system: Respiratory effort normal. Gastrointestinal system: Abdomen is nondistended, soft and nontender. Central nervous system: Alert.   Data Reviewed: I have personally reviewed following labs and imaging studies  CBC Lab Results  Component Value Date   WBC 5.0 09/30/2024   RBC 4.95 09/30/2024   HGB 14.7 09/30/2024   HCT 45.5 09/30/2024   MCV 91.9 09/30/2024   MCH 29.7 09/30/2024   PLT 220 09/30/2024   MCHC 32.3 09/30/2024   RDW 13.4 09/30/2024   LYMPHSABS 2.3 07/16/2024   MONOABS 0.7 07/16/2024   EOSABS 0.2 07/16/2024   BASOSABS 0.1 07/16/2024     Last metabolic panel Lab Results  Component Value Date   NA 141 09/30/2024   K 4.4 09/30/2024   CL 107 09/30/2024   CO2 24 09/30/2024   BUN 23 (H) 09/30/2024   CREATININE 0.97 09/30/2024   GLUCOSE 117 (H) 09/30/2024   GFRNONAA >60 09/30/2024   GFRAA >60 09/16/2018   CALCIUM 9.8 09/30/2024   PHOS 3.8 09/30/2024   PROT 6.1 (L) 08/28/2024   ALBUMIN  3.1 (L) 08/28/2024   BILITOT 0.7 08/28/2024   ALKPHOS 69 08/28/2024   AST 26 08/28/2024   ALT 40 08/28/2024   ANIONGAP 10 09/30/2024    GFR: Estimated Creatinine Clearance: 81.1 mL/min (by C-G formula based on SCr of 0.97 mg/dL).  No results found for this or any previous visit (from the past 240 hours).    Radiology Studies: No results found.    LOS: 80 days    Elgin Lam, MD [facility] 10/03/2024, 12:57 PM   If 7PM-7AM, please contact night-coverage [email or web address]  "

## 2024-10-04 NOTE — Progress Notes (Signed)
 "  PROGRESS NOTE    Ernest Romero  FMW:[id number] DOB: 1966/01/11 DOA: 07/15/2024 PCP: [name]   Brief Narrative: Ernest Romero is a 59 y.o. male with a history of alcohol abuse, bipolar disorder, panic disorder.  Patient presented secondary to being found down and found to have evidence of a large subdural hematoma, undergoing a left frontoparietal craniotomy with evacuation of the subdural hematoma and placement of a drain and subsequent removal. Patient required further management of his craniotomy and flap, which necessitated transfer from [facility] to [facility], where neurosurgery performed a left-sided allograft cranioplasty. Repeat CT head was significant for worsening fluid, requiring a left frontal burr hole for evacuation on 10/30. 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 Patient underwent a left bone flap removal and was treated with IV antibiotics per infectious disease recommendations.   Assessment and Plan:  Recurrent subdural hematoma Patient underwent cranioplasty and burr hole procedures per neurosurgery. Per neurosurgery, likely that patient will have reaccumulation of fluid. No new neurologic changes.  MRSA infected bone flap Infectious disease consulted and patient treated with Vancomycin  IV for 6 weeks, completed on 12/22, with further recommendation to complete 2 weeks of doxycycline  PO. - Continue doxycycline   Acute metabolic encephalopathy Likely related to above. Patient's mental status is stable, but he is requiring a recruitment consultant intermittently.  Bipolar disorder - Continue Ingressa  Drug-induced parkinsonism Akathisia Likely related to use of antipsychotics. EEG obtained and was negative for evidence of seizure activity. Propranolol  and Klonopin  started, in addition to Ingressa. - Continue propranolol , Klonopin ,  Ingressa  Dysphagia Patient required an NG tube for tube feeds from 12/3 until 12/8. - SLP recommendations (12/30): PO Diet Recommendation: Dysphagia 3 (Mechanical soft);Thin liquids (Level 0) Liquid Administration via: Cup;Straw Medication Administration: Whole meds with puree Supervision: Patient able to self-feed Postural changes: Position pt fully upright for meals Oral care recommendations: Oral care QID (4x/day)  Primary hypertension - Continue propranolol , amlodipine  and losartan .  Sinus bradycardia Complicated by propranolol  use. Patient with decreased dose and hold parameters applied.  Severe malnutrition - Dietitian recommendations (12/30): Continue Dysphagia 3 diet with thin liquids. Continue Magic Cup TID. Each supplement provides 290 Kcals and 9 grams of protein. Continue Ensure Plus High Protein PO BID. Each supplement provides 350 Kcals and 20 grams of protein. Continue multivitamin, thiamine , iron, and folic acid  daily. Continue mirtazapine . RD will follow-up monthly. Please send consult if needed  Iron deficiency anemia - Continue ferrous sulfate   Aspiration pneumonia Treated with Unasyn  IV.  Acute respiratory failure with hypoxia Secondary to pneumonia. Resolved.  Alcohol use disorder Alcohol withdrawal Patient managed on CIWA with phenobarbital  taper. Withdrawal resolved. - Continue thiamine , folic acid  and multivitamin   DVT prophylaxis: Lovenox  Code Status:   Code Status: Full Code Family Communication: None at bedside Disposition Plan: Discharge pending bed availability   Consultants:  Psychiatry PM&R Neurology Infectious disease PCCM  Procedures:  Left-sided allograft cranioplasty Left frontal burr hole for evacuation of subdural hematoma Left bone flap removal Cortrak placement/removal  Antimicrobials: Vancomycin  Doxycycline     Subjective: Questions about when he will be discharging. No other concerns.  Objective: BP 114/79  (BP Location: Right Arm)   Pulse 68   Temp 97.7 F (36.5 C) (Oral)   Resp 18   Ht 5' 10 (1.778 m)   Wt 70.8 kg   SpO2 95%   BMI 22.40 kg/m   Examination:  General exam: Appears calm and comfortable. Respiratory system: Respiratory effort normal. Gastrointestinal system: Abdomen is nondistended, soft and nontender. Central nervous system: Alert.   Data Reviewed: I have personally reviewed following labs and imaging studies  CBC Lab Results  Component Value Date   WBC 5.0 09/30/2024   RBC 4.95 09/30/2024   HGB 14.7 09/30/2024   HCT 45.5 09/30/2024   MCV 91.9 09/30/2024   MCH 29.7 09/30/2024   PLT 220 09/30/2024   MCHC 32.3 09/30/2024   RDW 13.4 09/30/2024   LYMPHSABS 2.3 07/16/2024   MONOABS 0.7 07/16/2024   EOSABS 0.2 07/16/2024   BASOSABS 0.1 07/16/2024     Last metabolic panel Lab Results  Component Value Date   NA 141 09/30/2024   K 4.4 09/30/2024   CL 107 09/30/2024   CO2 24 09/30/2024   BUN 23 (H) 09/30/2024   CREATININE 0.97 09/30/2024   GLUCOSE 117 (H) 09/30/2024   GFRNONAA >60 09/30/2024   GFRAA >60 09/16/2018   CALCIUM 9.8 09/30/2024   PHOS 3.8 09/30/2024   PROT 6.1 (L) 08/28/2024   ALBUMIN  3.1 (L) 08/28/2024   BILITOT 0.7 08/28/2024   ALKPHOS 69 08/28/2024   AST 26 08/28/2024   ALT 40 08/28/2024   ANIONGAP 10 09/30/2024    GFR: Estimated Creatinine Clearance: 81.1 mL/min (by C-G formula based on SCr of 0.97 mg/dL).  No results found for this or any previous visit (from the past 240 hours).    Radiology Studies: No results found.    LOS: 81 days    Elgin Lam, MD [facility] 10/04/2024, 12:47 PM   If 7PM-7AM, please contact night-coverage [email or web address]  "

## 2024-10-04 NOTE — Progress Notes (Signed)
 Physical Therapy Treatment Patient Details Name: Ernest Romero MRN: [id number] DOB: 29-Aug-1966 Today's Date: 10/04/2024   History of Present Illness 59 y.o. male presenting 07/16/24 from [facility] where he was admitted [facility]/30/[facility]. 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 On CIWA protocol. Transferred to [facility] for further management of craniotomy and flap. 10/27 s/p cranioplasty with bone flap replacement. 10/28 Change in status thought to be related to Klonopin  administration 10/28, repeat CTH shows significant increase in size of mixed attenuation subdural fluid collection underlying the L tempoparietal craniotomy site, now measuring approximately 15 mm in thickness, with associated mild mass effect and slight rightward midline shift. OR on 10/30 for SDH evacuation. [facility] 11/7 with fluid collected under bone flap. 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 Worsening dysphagia 11/29; repeat CTH stable 12/1. Cortrak placed 12/3-12/8. PMH includes alcohol use disorder, psychiatric disorder, HTN.    PT Comments  Continuing to slowly progress with goals. Pt has little carry over from session to session but with tactile cues pt does have ~ 25% carry over with improve gait mechanics. Worked on arm swing, contra lateral trunk movement and larger/wider steps with significant improvement in overall fluidity and stability of gait when utilizing techniques. Pt with improved affect this session with some smiles and participating more when asked questions. Pt continues to be a high risk for falls. Due to pt current functional status, home set up and available assistance at home recommending skilled physical therapy services < 3 hours/day in order to address strength, balance and functional mobility to decrease risk for falls, injury, immobility, skin break down and re-hospitalization.      If  plan is discharge home, recommend the following: Assistance with cooking/housework;Direct supervision/assist for medications management;Direct supervision/assist for financial management;Assist for transportation;Help with stairs or ramp for entrance;Supervision due to cognitive status;A little help with walking and/or transfers;A little help with bathing/dressing/bathroom   Can travel by private vehicle     Yes  Equipment Recommendations  Rollator (4 wheels)       Precautions / Restrictions Precautions Precautions: Fall Recall of Precautions/Restrictions: Impaired Precaution/Restrictions Comments: SBP <160, helmet (pt to wear when OOB) Restrictions Weight Bearing Restrictions Per Provider Order: No     Mobility  Bed Mobility Overal bed mobility: Needs Assistance Bed Mobility: Supine to Sit, Sit to Supine     Supine to sit: Supervision Sit to supine: Supervision        Transfers Overall transfer level: Needs assistance Equipment used: None Transfers: Sit to/from Stand Sit to Stand: Supervision                Ambulation/Gait Ambulation/Gait assistance: Contact guard assist, Supervision Gait Distance (Feet): 800 Feet Assistive device: None Gait Pattern/deviations: Decreased step length - right, Decreased step length - left, Decreased stride length, Decreased dorsiflexion - right, Decreased dorsiflexion - left Gait velocity: decreased; up to WNL Gait velocity interpretation: <1.8 ft/sec, indicate of risk for recurrent falls   General Gait Details: CGA when working on balance worked on arm swing with gait with contra lateral trunk rotation for balance with signicant increase in fluidity and balance with tactile cues at trunk; pt was able to perform for 50 ft at end of session with intermittent stiffness but overall significant improvement in gait. Using tiles on the floor for larger steps (one foot per tile) and wider steps (cannot cross line on  tiles) for improve gait  with subsequent adding movements including larger steps then arm swing/trunk rotation       Balance Overall balance assessment: Mild deficits observed, not formally tested Sitting-balance support: No upper extremity supported, Feet supported Sitting balance-Leahy Scale: Good   Postural control: Posterior lean Standing balance support: No upper extremity supported, During functional activity Standing balance-Leahy Scale: Fair Standing balance comment: supervision standing.        Communication Communication Communication: Impaired Factors Affecting Communication: Difficulty expressing self;Reduced clarity of speech  Cognition Arousal: Alert Behavior During Therapy: Flat affect   PT - Cognitive impairments: Memory, Problem solving, Safety/Judgement, Sequencing, Awareness, Attention   Rancho Levels of Cognitive Functioning Rancho Los Amigos Scales of Cognitive Functioning: Automatic, Appropriate: Minimal Assistance for Daily Living Skills Rancho Los Amigos Scales of Cognitive Functioning: Automatic, Appropriate: Minimal Assistance for Daily Living Skills [VII] PT - Cognition Comments: Pt with slightly less flat effect today; smiling a couple times and interacting during session. Jaw tremor is improving. Following commands: Impaired Following commands impaired: Follows multi-step commands with increased time, Follows multi-step commands inconsistently    Cueing Cueing Techniques: Verbal cues, Gestural cues     General Comments General comments (skin integrity, edema, etc.): No signs/symptoms of cardiac/respiratory distress.      Pertinent Vitals/Pain Pain Assessment Pain Assessment: No/denies pain Pain Intervention(s): Monitored during session     PT Goals (current goals can now be found in the care plan section) Acute Rehab PT Goals Patient Stated Goal: none stated PT Goal Formulation: With patient Time For Goal Achievement: 10/14/24 Potential to Achieve Goals:  Fair Progress towards PT goals: Progressing toward goals    Frequency    Min 2X/week      PT Plan  Continue with current POC        AM-PAC PT 6 Clicks Mobility   Outcome Measure  Help needed turning from your back to your side while in a flat bed without using bedrails?: None Help needed moving from lying on your back to sitting on the side of a flat bed without using bedrails?: None Help needed moving to and from a bed to a chair (including a wheelchair)?: A Little Help needed standing up from a chair using your arms (e.g., wheelchair or bedside chair)?: None Help needed to walk in hospital room?: A Little Help needed climbing 3-5 steps with a railing? : A Little 6 Click Score: 21    End of Session Equipment Utilized During Treatment: Gait belt Activity Tolerance: Patient tolerated treatment well Patient left: in bed;with nursing/sitter in room;with call bell/phone within reach Nurse Communication: Mobility status PT Visit Diagnosis: Other symptoms and signs involving the nervous system (R29.898);Muscle weakness (generalized) (M62.81);Unsteadiness on feet (R26.81);Other abnormalities of gait and mobility (R26.89)     Time: 8370-8359 PT Time Calculation (min) (ACUTE ONLY): 11 min  Charges:    $Gait Training: 8-22 mins PT General Charges $$ ACUTE PT VISIT: 1 Visit                    Dorothyann Maier, DPT, CLT  Acute Rehabilitation Services Office: [phone] (Secure chat preferred)    Dorothyann VEAR Maier 10/04/2024, 5:10 PM

## 2024-10-04 NOTE — Plan of Care (Signed)
  Problem: Education: Goal: Knowledge of General Education information will improve Description: Including pain rating scale, medication(s)/side effects and non-pharmacologic comfort measures Outcome: Progressing   Problem: Health Behavior/Discharge Planning: Goal: Ability to manage health-related needs will improve Outcome: Progressing   Problem: Clinical Measurements: Goal: Ability to maintain clinical measurements within normal limits will improve Outcome: Progressing Goal: Will remain free from infection Outcome: Progressing Goal: Diagnostic test results will improve Outcome: Progressing Goal: Respiratory complications will improve Outcome: Progressing Goal: Cardiovascular complication will be avoided Outcome: Progressing   Problem: Activity: Goal: Risk for activity intolerance will decrease Outcome: Progressing   Problem: Nutrition: Goal: Adequate nutrition will be maintained Outcome: Progressing   Problem: Coping: Goal: Level of anxiety will decrease Outcome: Progressing   Problem: Elimination: Goal: Will not experience complications related to bowel motility Outcome: Progressing Goal: Will not experience complications related to urinary retention Outcome: Progressing   Problem: Pain Managment: Goal: General experience of comfort will improve and/or be controlled Outcome: Progressing   Problem: Safety: Goal: Ability to remain free from injury will improve Outcome: Progressing   Problem: Skin Integrity: Goal: Risk for impaired skin integrity will decrease Outcome: Progressing   Problem: Education: Goal: Knowledge of the prescribed therapeutic regimen will improve Outcome: Progressing   Problem: Clinical Measurements: Goal: Usual level of consciousness will be regained or maintained. Outcome: Progressing Goal: Neurologic status will improve Outcome: Progressing Goal: Ability to maintain intracranial pressure will improve Outcome: Progressing    Problem: Skin Integrity: Goal: Demonstration of wound healing without infection will improve Outcome: Progressing

## 2024-10-05 NOTE — Progress Notes (Signed)
 "  PROGRESS NOTE    Ernest Romero  FMW:[id number] DOB: October 15, 1965 DOA: 07/15/2024 [facility]: [name]   Brief Narrative: Ernest Romero is a 59 y.o. male with a history of alcohol abuse, bipolar disorder, panic disorder.  Patient presented secondary to being found down and found to have evidence of a large subdural hematoma, undergoing a left frontoparietal craniotomy with evacuation of the subdural hematoma and placement of a drain and subsequent removal. Patient required further management of his craniotomy and flap, which necessitated transfer from [facility] to [facility], where neurosurgery performed a left-sided allograft cranioplasty. Repeat CT head was significant for worsening fluid, requiring a left frontal burr hole for evacuation on 10/30. 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 Patient underwent a left bone flap removal and was treated with IV antibiotics per infectious disease recommendations.   Assessment and Plan:  Recurrent subdural hematoma Patient underwent cranioplasty and burr hole procedures per neurosurgery. Per neurosurgery, likely that patient will have reaccumulation of fluid. No new neurologic changes.  MRSA infected bone flap Infectious disease consulted and patient treated with Vancomycin  IV for 6 weeks, completed on 12/22, with further recommendation to complete 2 weeks of doxycycline  PO. - Continue doxycycline   Acute metabolic encephalopathy Likely related to above. Patient's mental status is stable, but he is requiring a recruitment consultant intermittently.  Bipolar disorder - Continue Ingressa  Drug-induced parkinsonism Akathisia Likely related to use of antipsychotics. EEG obtained and was negative for evidence of seizure activity. Propranolol  and Klonopin  started, in addition to Ingressa. - Continue propranolol , Klonopin ,  Ingressa  Dysphagia Patient required an NG tube for tube feeds from 12/3 until 12/8. - SLP recommendations (12/30): PO Diet Recommendation: Dysphagia 3 (Mechanical soft);Thin liquids (Level 0) Liquid Administration via: Cup;Straw Medication Administration: Whole meds with puree Supervision: Patient able to self-feed Postural changes: Position pt fully upright for meals Oral care recommendations: Oral care QID (4x/day)  Primary hypertension - Continue propranolol , amlodipine  and losartan .  Sinus bradycardia Complicated by propranolol  use. Patient with decreased dose and hold parameters applied.  Severe malnutrition - Dietitian recommendations (12/30): Continue Dysphagia 3 diet with thin liquids. Continue Magic Cup TID. Each supplement provides 290 Kcals and 9 grams of protein. Continue Ensure Plus High Protein PO BID. Each supplement provides 350 Kcals and 20 grams of protein. Continue multivitamin, thiamine , iron, and folic acid  daily. Continue mirtazapine . RD will follow-up monthly. Please send consult if needed  Iron deficiency anemia - Continue ferrous sulfate   Aspiration pneumonia Treated with Unasyn  IV.  Acute respiratory failure with hypoxia Secondary to pneumonia. Resolved.  Alcohol use disorder Alcohol withdrawal Patient managed on CIWA with phenobarbital  taper. Withdrawal resolved. - Continue thiamine , folic acid  and multivitamin   DVT prophylaxis: Lovenox  Code Status:   Code Status: Full Code Family Communication: None at bedside Disposition Plan: Discharge pending bed availability   Consultants:  Psychiatry PM&R Neurology Infectious disease PCCM  Procedures:  Left-sided allograft cranioplasty Left frontal burr hole for evacuation of subdural hematoma Left bone flap removal Cortrak placement/removal  Antimicrobials: Vancomycin  Doxycycline     Subjective: No issues from overnight.  Objective: BP 110/79 (BP Location: Right Arm)   Pulse (!)  54   Temp 97.9 F (36.6 C) (Oral)   Resp 16   Ht 5' 10 (1.778 m)   Wt 71.1 kg   SpO2 95%   BMI 22.49 kg/m   Examination:  General exam: Appears calm  and comfortable. Respiratory system: Respiratory effort normal.   Data Reviewed: I have personally reviewed following labs and imaging studies  CBC Lab Results  Component Value Date   WBC 5.0 09/30/2024   RBC 4.95 09/30/2024   HGB 14.7 09/30/2024   HCT 45.5 09/30/2024   MCV 91.9 09/30/2024   MCH 29.7 09/30/2024   PLT 220 09/30/2024   MCHC 32.3 09/30/2024   RDW 13.4 09/30/2024   LYMPHSABS 2.3 07/16/2024   MONOABS 0.7 07/16/2024   EOSABS 0.2 07/16/2024   BASOSABS 0.1 07/16/2024     Last metabolic panel Lab Results  Component Value Date   NA 141 09/30/2024   K 4.4 09/30/2024   CL 107 09/30/2024   CO2 24 09/30/2024   BUN 23 (H) 09/30/2024   CREATININE 0.97 09/30/2024   GLUCOSE 117 (H) 09/30/2024   GFRNONAA >60 09/30/2024   GFRAA >60 09/16/2018   CALCIUM 9.8 09/30/2024   PHOS 3.8 09/30/2024   PROT 6.1 (L) 08/28/2024   ALBUMIN  3.1 (L) 08/28/2024   BILITOT 0.7 08/28/2024   ALKPHOS 69 08/28/2024   AST 26 08/28/2024   ALT 40 08/28/2024   ANIONGAP 10 09/30/2024    GFR: Estimated Creatinine Clearance: 81.4 mL/min (by C-G formula based on SCr of 0.97 mg/dL).  No results found for this or any previous visit (from the past 240 hours).    Radiology Studies: No results found.    LOS: 82 days    Elgin Lam, MD [facility] 10/05/2024, 1:51 PM   If 7PM-7AM, please contact night-coverage [email or web address]  "

## 2024-10-05 NOTE — Plan of Care (Signed)
  Problem: Education: Goal: Knowledge of General Education information will improve Description: Including pain rating scale, medication(s)/side effects and non-pharmacologic comfort measures Outcome: Progressing   Problem: Health Behavior/Discharge Planning: Goal: Ability to manage health-related needs will improve Outcome: Progressing   Problem: Nutrition: Goal: Adequate nutrition will be maintained Outcome: Progressing   Problem: Pain Managment: Goal: General experience of comfort will improve and/or be controlled Outcome: Progressing

## 2024-10-06 NOTE — Plan of Care (Signed)
" °  Problem: Education: Goal: Knowledge of General Education information will improve Description: Including pain rating scale, medication(s)/side effects and non-pharmacologic comfort measures Outcome: Progressing   Problem: Health Behavior/Discharge Planning: Goal: Ability to manage health-related needs will improve Outcome: Progressing   Problem: Clinical Measurements: Goal: Ability to maintain clinical measurements within normal limits will improve Outcome: Progressing Goal: Diagnostic test results will improve Outcome: Progressing   Problem: Activity: Goal: Risk for activity intolerance will decrease Outcome: Progressing   Problem: Nutrition: Goal: Adequate nutrition will be maintained Outcome: Progressing   Problem: Coping: Goal: Level of anxiety will decrease Outcome: Progressing   Problem: Pain Managment: Goal: General experience of comfort will improve and/or be controlled Outcome: Progressing   Problem: Safety: Goal: Ability to remain free from injury will improve Outcome: Progressing   Problem: Skin Integrity: Goal: Risk for impaired skin integrity will decrease Outcome: Progressing   Problem: Education: Goal: Knowledge of the prescribed therapeutic regimen will improve Outcome: Progressing   Problem: Clinical Measurements: Goal: Usual level of consciousness will be regained or maintained. Outcome: Progressing Goal: Neurologic status will improve Outcome: Progressing Goal: Ability to maintain intracranial pressure will improve Outcome: Progressing   Problem: Skin Integrity: Goal: Demonstration of wound healing without infection will improve Outcome: Progressing   "

## 2024-10-06 NOTE — Progress Notes (Signed)
 "  PROGRESS NOTE    SPURGEON GANCARZ  FMW:[id number] DOB: October 07, 1965 DOA: 07/15/2024 PCP: [name]   Brief Narrative: Ernest Romero is a 59 y.o. male with a history of alcohol abuse, bipolar disorder, panic disorder.  Patient presented secondary to being found down and found to have evidence of a large subdural hematoma, undergoing a left frontoparietal craniotomy with evacuation of the subdural hematoma and placement of a drain and subsequent removal. Patient required further management of his craniotomy and flap, which necessitated transfer from [facility] to [facility], where neurosurgery performed a left-sided allograft cranioplasty. Repeat CT head was significant for worsening fluid, requiring a left frontal burr hole for evacuation on 10/30. 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 Patient underwent a left bone flap removal and was treated with IV antibiotics per infectious disease recommendations.   Assessment and Plan:  Recurrent subdural hematoma Patient underwent cranioplasty and burr hole procedures per neurosurgery. Per neurosurgery, likely that patient will have reaccumulation of fluid. No new neurologic changes.  MRSA infected bone flap Infectious disease consulted and patient treated with Vancomycin  IV for 6 weeks, completed on 12/22, with further recommendation to complete 2 weeks of doxycycline  PO. - Continue doxycycline ; last day of treatment is 1/6  Acute metabolic encephalopathy Likely related to above. Patient's mental status is stable, but he is requiring a recruitment consultant intermittently.  Bipolar disorder - Continue Ingressa  Drug-induced parkinsonism Akathisia Likely related to use of antipsychotics. EEG obtained and was negative for evidence of seizure activity. Propranolol  and Klonopin  started, in addition to Ingressa. - Continue  propranolol , Klonopin , Ingressa  Dysphagia Patient required an NG tube for tube feeds from 12/3 until 12/8. - SLP recommendations (12/30): PO Diet Recommendation: Dysphagia 3 (Mechanical soft);Thin liquids (Level 0) Liquid Administration via: Cup;Straw Medication Administration: Whole meds with puree Supervision: Patient able to self-feed Postural changes: Position pt fully upright for meals Oral care recommendations: Oral care QID (4x/day)  Primary hypertension - Continue propranolol , amlodipine  and losartan .  Sinus bradycardia Complicated by propranolol  use. Patient with decreased dose and hold parameters applied.  Severe malnutrition - Dietitian recommendations (12/30): Continue Dysphagia 3 diet with thin liquids. Continue Magic Cup TID. Each supplement provides 290 Kcals and 9 grams of protein. Continue Ensure Plus High Protein PO BID. Each supplement provides 350 Kcals and 20 grams of protein. Continue multivitamin, thiamine , iron, and folic acid  daily. Continue mirtazapine . RD will follow-up monthly. Please send consult if needed  Iron deficiency anemia - Continue ferrous sulfate   Aspiration pneumonia Treated with Unasyn  IV.  Acute respiratory failure with hypoxia Secondary to pneumonia. Resolved.  Alcohol use disorder Alcohol withdrawal Patient managed on CIWA with phenobarbital  taper. Withdrawal resolved. - Continue thiamine , folic acid  and multivitamin   DVT prophylaxis: Lovenox  Code Status:   Code Status: Full Code Family Communication: None at bedside Disposition Plan: Discharge pending bed availability   Consultants:  Psychiatry PM&R Neurology Infectious disease PCCM  Procedures:  Left-sided allograft cranioplasty Left frontal burr hole for evacuation of subdural hematoma Left bone flap removal Cortrak placement/removal  Antimicrobials: Vancomycin  Doxycycline     Subjective: No issues this morning. Hoping to get back to his  bed.  Objective: BP 103/81 (BP Location: Right Arm)   Pulse (!) 54   Temp (!) 97.5 F (36.4 C) (Oral)   Resp 20   Ht 5' 10 (1.778 m)   Wt 71.1 kg   SpO2  96%   BMI 22.49 kg/m   Examination:  General exam: Appears calm and comfortable. Sitting in his bedside chair. Respiratory system: Respiratory effort normal.   Data Reviewed: I have personally reviewed following labs and imaging studies  CBC Lab Results  Component Value Date   WBC 5.0 09/30/2024   RBC 4.95 09/30/2024   HGB 14.7 09/30/2024   HCT 45.5 09/30/2024   MCV 91.9 09/30/2024   MCH 29.7 09/30/2024   PLT 220 09/30/2024   MCHC 32.3 09/30/2024   RDW 13.4 09/30/2024   LYMPHSABS 2.3 07/16/2024   MONOABS 0.7 07/16/2024   EOSABS 0.2 07/16/2024   BASOSABS 0.1 07/16/2024     Last metabolic panel Lab Results  Component Value Date   NA 141 09/30/2024   K 4.4 09/30/2024   CL 107 09/30/2024   CO2 24 09/30/2024   BUN 23 (H) 09/30/2024   CREATININE 0.97 09/30/2024   GLUCOSE 117 (H) 09/30/2024   GFRNONAA >60 09/30/2024   GFRAA >60 09/16/2018   CALCIUM 9.8 09/30/2024   PHOS 3.8 09/30/2024   PROT 6.1 (L) 08/28/2024   ALBUMIN  3.1 (L) 08/28/2024   BILITOT 0.7 08/28/2024   ALKPHOS 69 08/28/2024   AST 26 08/28/2024   ALT 40 08/28/2024   ANIONGAP 10 09/30/2024    GFR: Estimated Creatinine Clearance: 81.4 mL/min (by C-G formula based on SCr of 0.97 mg/dL).  No results found for this or any previous visit (from the past 240 hours).    Radiology Studies: No results found.    LOS: 83 days    Elgin Lam, MD [facility] 10/06/2024, 12:39 PM   If 7PM-7AM, please contact night-coverage [email or web address]  "

## 2024-10-06 NOTE — Plan of Care (Signed)
  Problem: Education: Goal: Knowledge of General Education information will improve Description: Including pain rating scale, medication(s)/side effects and non-pharmacologic comfort measures Outcome: Progressing   Problem: Health Behavior/Discharge Planning: Goal: Ability to manage health-related needs will improve Outcome: Progressing   Problem: Clinical Measurements: Goal: Ability to maintain clinical measurements within normal limits will improve Outcome: Progressing   Problem: Nutrition: Goal: Adequate nutrition will be maintained Outcome: Progressing   Problem: Pain Managment: Goal: General experience of comfort will improve and/or be controlled Outcome: Progressing

## 2024-10-07 NOTE — Progress Notes (Signed)
 Physical Therapy Treatment Patient Details Name: Ernest Romero MRN: [id number] DOB: 06/09/1966 Today's Date: 10/07/2024   History of Present Illness 59 y.o. male presenting 07/16/24 from [facility] where he was [facility]. 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 On CIWA protocol. Transferred to [facility] for further management of craniotomy and flap. 10/27 s/p cranioplasty with bone flap replacement. 10/28 Change in status thought to be related to Klonopin  administration 10/28, repeat CTH shows significant increase in size of mixed attenuation subdural fluid collection underlying the L tempoparietal craniotomy site, now measuring approximately 15 mm in thickness, with associated mild mass effect and slight rightward midline shift. OR on 10/30 for SDH evacuation. [facility] 11/7 with fluid collected under bone flap. 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 Worsening dysphagia 11/29; repeat CTH stable 12/1. Cortrak placed 12/3-12/8. PMH includes alcohol use disorder, psychiatric disorder, HTN.    PT Comments  Pt continues to require up to Min A when working on balance activities and remains a high risk for falls further complicated by poor safety awareness and frequently forgetting helmet when trying to stand. Focus of session on balance and gait today with pt gait slightly improved at beginning of session with larger steps and very minimal arm swing for improved balance. Due to pt current functional status, home set up and available assistance at home recommending skilled physical therapy services < 3 hours/day in order to address strength, balance and functional mobility to decrease risk for falls, injury, immobility, skin break down and re-hospitalization.      If plan is discharge home, recommend the following: Assistance with cooking/housework;Direct supervision/assist for  medications management;Direct supervision/assist for financial management;Assist for transportation;Help with stairs or ramp for entrance;Supervision due to cognitive status;A little help with walking and/or transfers;A little help with bathing/dressing/bathroom   Can travel by private vehicle     Yes  Equipment Recommendations  None recommended by PT       Precautions / Restrictions Precautions Precautions: Fall Recall of Precautions/Restrictions: Impaired Precaution/Restrictions Comments: SBP <160, helmet (pt to wear when OOB) Restrictions Weight Bearing Restrictions Per Provider Order: No     Mobility  Bed Mobility Overal bed mobility: Needs Assistance Bed Mobility: Supine to Sit, Sit to Supine     Supine to sit: Supervision Sit to supine: Supervision        Transfers Overall transfer level: Needs assistance Equipment used: None Transfers: Sit to/from Stand Sit to Stand: Supervision           General transfer comment: pt attempting to get out of bed without helmet; requires cues to don.    Ambulation/Gait Ambulation/Gait assistance: Contact guard assist, Supervision Gait Distance (Feet): 600 Feet Assistive device: None Gait Pattern/deviations: Decreased step length - right, Decreased step length - left, Decreased stride length, Decreased dorsiflexion - right, Decreased dorsiflexion - left Gait velocity: decreased; up to WNL Gait velocity interpretation: <1.8 ft/sec, indicate of risk for recurrent falls   General Gait Details: CGA continued working on balance and arm swing with gait with contra lateral trunk rotation for balance continues with significant increase in fluidity and balance with tactile cues at trunk; pt was able to perform for 50 ft at end of session with intermittent stiffness but overall significant improvement in gait. Using tiles on the floor for larger steps (one foot per tile) and wider steps (cannot cross line on tiles)  for improve gait with  subsequent adding movements including larger steps then arm swing/trunk rotationthen worked on r.r. donnelley gait wtih smaller tiles requiring Min A for more narrow lines of gait.      Balance Overall balance assessment: Mild deficits observed, not formally tested Sitting-balance support: No upper extremity supported, Feet supported Sitting balance-Leahy Scale: Good     Standing balance support: No upper extremity supported, During functional activity Standing balance-Leahy Scale: Fair Standing balance comment: supervision standing.      Communication Communication Communication: Impaired Factors Affecting Communication: Difficulty expressing self;Reduced clarity of speech  Cognition Arousal: Alert Behavior During Therapy: Flat affect   PT - Cognitive impairments: Memory, Problem solving, Safety/Judgement, Sequencing, Awareness, Attention   Rancho Levels of Cognitive Functioning Rancho Los Amigos Scales of Cognitive Functioning: Automatic, Appropriate: Minimal Assistance for Daily Living Skills Rancho Los Amigos Scales of Cognitive Functioning: Automatic, Appropriate: Minimal Assistance for Daily Living Skills [VII]   Following commands: Impaired Following commands impaired: Follows multi-step commands with increased time, Follows multi-step commands inconsistently    Cueing Cueing Techniques: Verbal cues, Gestural cues     General Comments General comments (skin integrity, edema, etc.): no signs/symptoms of cardiac/respiratory distress during session      Pertinent Vitals/Pain Pain Assessment Pain Assessment: No/denies pain     PT Goals (current goals can now be found in the care plan section) Acute Rehab PT Goals Patient Stated Goal: none stated PT Goal Formulation: With patient Time For Goal Achievement: 10/14/24 Potential to Achieve Goals: Fair Progress towards PT goals: Progressing toward goals    Frequency    Min 2X/week      PT Plan  Continue with current  POC        AM-PAC PT 6 Clicks Mobility   Outcome Measure  Help needed turning from your back to your side while in a flat bed without using bedrails?: A Little Help needed moving from lying on your back to sitting on the side of a flat bed without using bedrails?: A Little Help needed moving to and from a bed to a chair (including a wheelchair)?: A Little Help needed standing up from a chair using your arms (e.g., wheelchair or bedside chair)?: A Little Help needed to walk in hospital room?: A Little Help needed climbing 3-5 steps with a railing? : A Little 6 Click Score: 18    End of Session Equipment Utilized During Treatment: Gait belt Activity Tolerance: Patient tolerated treatment well Patient left: in bed;with nursing/sitter in room;with call bell/phone within reach Nurse Communication: Mobility status PT Visit Diagnosis: Other symptoms and signs involving the nervous system (R29.898);Muscle weakness (generalized) (M62.81);Unsteadiness on feet (R26.81);Other abnormalities of gait and mobility (R26.89)     Time: 8498-8485 PT Time Calculation (min) (ACUTE ONLY): 13 min  Charges:    $Gait Training: 8-22 mins PT General Charges $$ ACUTE PT VISIT: 1 Visit                     Dorothyann Maier, DPT, CLT  Acute Rehabilitation Services Office: [phone] (Secure chat preferred)    Dorothyann VEAR Maier 10/07/2024, 4:42 PM

## 2024-10-07 NOTE — Progress Notes (Signed)
 "  PROGRESS NOTE    Ernest Romero  FMW:[id number] DOB: 11-Nov-1965 DOA: 07/15/2024 [facility]: [name]   Brief Narrative: Ernest Romero is a 59 y.o. male with a history of alcohol abuse, bipolar disorder, panic disorder.  Patient presented secondary to being found down and found to have evidence of a large subdural hematoma, undergoing a left frontoparietal craniotomy with evacuation of the subdural hematoma and placement of a drain and subsequent removal. Patient required further management of his craniotomy and flap, which necessitated transfer from [facility] to [facility], where neurosurgery performed a left-sided allograft cranioplasty. Repeat CT head was significant for worsening fluid, requiring a left frontal burr hole for evacuation on 10/30. 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 Patient underwent a left bone flap removal and was treated with IV antibiotics per infectious disease recommendations.   Assessment and Plan:  Recurrent subdural hematoma Patient underwent cranioplasty and burr hole procedures per neurosurgery. Per neurosurgery, likely that patient will have reaccumulation of fluid. No new neurologic changes.  MRSA infected bone flap Infectious disease consulted and patient treated with Vancomycin  IV for 6 weeks, completed on 12/22, with further recommendation to complete 2 weeks of doxycycline  PO. - Continue doxycycline ; last day of treatment is 1/6  Acute metabolic encephalopathy Likely related to above. Patient's mental status is stable, but he is requiring a recruitment consultant intermittently.  Bipolar disorder - Continue Ingressa  Drug-induced parkinsonism Akathisia Likely related to use of antipsychotics. EEG obtained and was negative for evidence of seizure activity. Propranolol  and Klonopin  started, in addition to Ingressa. - Continue  propranolol , Klonopin , Ingressa  Dysphagia Patient required an NG tube for tube feeds from 12/3 until 12/8. - SLP recommendations (12/30): PO Diet Recommendation: Dysphagia 3 (Mechanical soft);Thin liquids (Level 0) Liquid Administration via: Cup;Straw Medication Administration: Whole meds with puree Supervision: Patient able to self-feed Postural changes: Position pt fully upright for meals Oral care recommendations: Oral care QID (4x/day)  Primary hypertension - Continue propranolol , amlodipine  and losartan .  Sinus bradycardia Complicated by propranolol  use. Patient with decreased dose and hold parameters applied.  Severe malnutrition - Dietitian recommendations (12/30): Continue Dysphagia 3 diet with thin liquids. Continue Magic Cup TID. Each supplement provides 290 Kcals and 9 grams of protein. Continue Ensure Plus High Protein PO BID. Each supplement provides 350 Kcals and 20 grams of protein. Continue multivitamin, thiamine , iron, and folic acid  daily. Continue mirtazapine . RD will follow-up monthly. Please send consult if needed  Iron deficiency anemia - Continue ferrous sulfate   Aspiration pneumonia Treated with Unasyn  IV.  Acute respiratory failure with hypoxia Secondary to pneumonia. Resolved.  Alcohol use disorder Alcohol withdrawal Patient managed on CIWA with phenobarbital  taper. Withdrawal resolved. - Continue thiamine , folic acid  and multivitamin   DVT prophylaxis: Lovenox  Code Status:   Code Status: Full Code Family Communication: None at bedside Disposition Plan: Discharge pending bed availability   Consultants:  Psychiatry PM&R Neurology Infectious disease PCCM  Procedures:  Left-sided allograft cranioplasty Left frontal burr hole for evacuation of subdural hematoma Left bone flap removal Cortrak placement/removal  Antimicrobials: Vancomycin  Doxycycline     Subjective: Concerned about when he will be discharged.  Objective: BP  114/84 (BP Location: Right Arm)   Pulse 64   Temp (!) 97.4 F (36.3 C) (Oral)   Resp 18   Ht 5' 10 (1.778 m)   Wt 71.1 kg   SpO2 96%   BMI 22.49  kg/m   Examination:  General exam: Appears calm and comfortable. Sitting in his bedside chair. Respiratory system: Respiratory effort normal.   Data Reviewed: I have personally reviewed following labs and imaging studies  CBC Lab Results  Component Value Date   WBC 5.0 09/30/2024   RBC 4.95 09/30/2024   HGB 14.7 09/30/2024   HCT 45.5 09/30/2024   MCV 91.9 09/30/2024   MCH 29.7 09/30/2024   PLT 220 09/30/2024   MCHC 32.3 09/30/2024   RDW 13.4 09/30/2024   LYMPHSABS 2.3 07/16/2024   MONOABS 0.7 07/16/2024   EOSABS 0.2 07/16/2024   BASOSABS 0.1 07/16/2024     Last metabolic panel Lab Results  Component Value Date   NA 141 09/30/2024   K 4.4 09/30/2024   CL 107 09/30/2024   CO2 24 09/30/2024   BUN 23 (H) 09/30/2024   CREATININE 0.97 09/30/2024   GLUCOSE 117 (H) 09/30/2024   GFRNONAA >60 09/30/2024   GFRAA >60 09/16/2018   CALCIUM 9.8 09/30/2024   PHOS 3.8 09/30/2024   PROT 6.1 (L) 08/28/2024   ALBUMIN  3.1 (L) 08/28/2024   BILITOT 0.7 08/28/2024   ALKPHOS 69 08/28/2024   AST 26 08/28/2024   ALT 40 08/28/2024   ANIONGAP 10 09/30/2024    GFR: Estimated Creatinine Clearance: 81.4 mL/min (by C-G formula based on SCr of 0.97 mg/dL).  No results found for this or any previous visit (from the past 240 hours).    Radiology Studies: No results found.    LOS: 84 days    Elgin Lam, MD [facility] 10/07/2024, 12:11 PM   If 7PM-7AM, please contact night-coverage [email or web address]  "

## 2024-10-07 NOTE — TOC Progression Note (Signed)
 Transition of Care [facility]) - Progression Note    Patient Details  Name: Ernest Romero MRN: [id number] Date of Birth: November 21, 1965  Transition of Care [facility] CM/SW Contact  [name], [place] Phone Number: 10/07/2024, 1:23 PM  Clinical Narrative:     CSW spoke with patient's peer support person, Larnell, about getting clothes for patient. Joe shared he has some of the patient's clothes at his home and will bring them to the hospital. Patient reportedly had clothes and books at [facility] he was supposed to transfer to [facility] with, but are not in his room. CSW will ask about location of patient's belongings.   Patient has no new bed offers at this time. CSW will continue reaching out to facilities for placement.   Expected Discharge Plan: Skilled Nursing Facility Barriers to Discharge: Continued Medical Work up, English As A Second Language Teacher, SNF Pending bed offer               Expected Discharge Plan and Services In-house Referral: Clinical Social Work Discharge Planning Services: CM Consult                                           Social Drivers of Health (SDOH) Interventions SDOH Screenings   Food Insecurity: Patient Unable To Answer (07/16/2024)  Housing: Unknown (07/16/2024)  Transportation Needs: Patient Unable To Answer (07/16/2024)  Utilities: Patient Unable To Answer (07/16/2024)  Alcohol Screen: Low Risk (08/30/2022)  Depression (PHQ2-9): Medium Risk (08/30/2022)  Tobacco Use: Medium Risk (08/12/2024)    Readmission Risk Interventions    07/16/2024   12:23 PM  Readmission Risk Prevention Plan  Transportation Screening Complete  Medication Review (RN Care Manager) Complete  PCP or Specialist appointment within 3-5 days of discharge Complete  HRI or Home Care Consult Complete  SW Recovery Care/Counseling Consult Complete  Palliative Care Screening Not Applicable  Skilled Nursing Facility Not Applicable

## 2024-10-07 NOTE — Progress Notes (Signed)
 Assessment/Plan: The patient demonstrates appropriate recovery with an intact neurological exam and no new deficits. Incision site is clean and dry, and almost fully healed. He denies pain at this time. There are no signs of infection or complication at this time.  Discharge pending bed availability.  No changes to neurosurgical care at this time  Subjective: Patient denies any concerns at this time. Denies pain Denies neurologic changes  Patient states that he just wants to be discharged.  Objective: Vital signs in last 24 hours: Temp:  [97.4 F (36.3 C)-98.3 F (36.8 C)] 97.4 F (36.3 C) (01/05 0850) Pulse Rate:  [55-68] 64 (01/05 0850) Resp:  [18-20] 18 (01/05 0850) BP: (110-142)/(81-92) 114/84 (01/05 0850) SpO2:  [94 %-97 %] 96 % (01/05 0850) Weight:  [71.1 kg] 71.1 kg (01/05 0500)  Intake/Output from previous day: 01/04 0701 - 01/05 0700 In: 720 [P.O.:720] Out: -  Intake/Output this shift: Total I/O In: 240 [P.O.:240] Out: -     Physical Exam   Patient appears calm and comfortable, eating his food in the chair without any concerns.    HENT:     Head: Normocephalic.     Nose: Nose normal.  Eyes:     Pupils: Pupils are equal, round, and reactive to light.  Cardiovascular:     Rate and Rhythm: Normal rate.  Pulmonary:     Effort: Pulmonary effort is normal.  Abdominal:     General: Abdomen is flat.  Musculoskeletal:     Cervical back: Normal range of motion.    Neurologic Examination  Mental status/Cognition: Alert, oriented to self, place, month and year, good attention.  Speech/language: Fluent, comprehension intact, object naming intact, repetition intact.  Cranial nerves:   CN II Pupils equal and reactive to light,    CN III,IV,VI EOM intact, no gaze preference or deviation, no nystagmus    CN V Normal sensation in V1, V2, and V3 segments bilaterally    CN VII Normal motor function; smiling, frowning, blinking. Eyes appear moisturized   CN VIII Normal  hearing to speech    CN IX & X Normal palatal elevation, no uvular deviation    CN XI 5/5 head turn and 5/5 shoulder shrug bilaterally    CN XII Midline tongue protrusion   Sensory: Sensation is intact.  Motor: Motor function is intact. Coordination: Finger to Nose intact bilaterally, heel to shin intact bilaterally to assess, rapid alternating movement intact in in bilateral upper extremities.  Skin:  Incision is CDI   Lab Results: No results for input(s): WBC, HGB, HCT, PLT in the last 72 hours. BMET No results for input(s): NA, K, CL, CO2, GLUCOSE, BUN, CREATININE, CALCIUM in the last 72 hours.  Studies/Results: No results found.  Ernest Romero 10/07/2024, 3:10 PM

## 2024-10-07 NOTE — Plan of Care (Signed)
  Problem: Education: Goal: Knowledge of General Education information will improve Description: Including pain rating scale, medication(s)/side effects and non-pharmacologic comfort measures Outcome: Progressing   Problem: Health Behavior/Discharge Planning: Goal: Ability to manage health-related needs will improve Outcome: Progressing   Problem: Clinical Measurements: Goal: Ability to maintain clinical measurements within normal limits will improve Outcome: Progressing Goal: Will remain free from infection Outcome: Progressing Goal: Diagnostic test results will improve Outcome: Progressing Goal: Respiratory complications will improve Outcome: Progressing Goal: Cardiovascular complication will be avoided Outcome: Progressing   Problem: Activity: Goal: Risk for activity intolerance will decrease Outcome: Progressing   Problem: Nutrition: Goal: Adequate nutrition will be maintained Outcome: Progressing   Problem: Coping: Goal: Level of anxiety will decrease Outcome: Progressing   Problem: Elimination: Goal: Will not experience complications related to bowel motility Outcome: Progressing Goal: Will not experience complications related to urinary retention Outcome: Progressing   Problem: Pain Managment: Goal: General experience of comfort will improve and/or be controlled Outcome: Progressing   Problem: Safety: Goal: Ability to remain free from injury will improve Outcome: Progressing   Problem: Skin Integrity: Goal: Risk for impaired skin integrity will decrease Outcome: Progressing   Problem: Education: Goal: Knowledge of the prescribed therapeutic regimen will improve Outcome: Progressing   Problem: Clinical Measurements: Goal: Usual level of consciousness will be regained or maintained. Outcome: Progressing Goal: Neurologic status will improve Outcome: Progressing Goal: Ability to maintain intracranial pressure will improve Outcome: Progressing    Problem: Skin Integrity: Goal: Demonstration of wound healing without infection will improve Outcome: Progressing

## 2024-10-08 ENCOUNTER — Telehealth: Payer: Self-pay

## 2024-10-08 NOTE — Plan of Care (Signed)
  Problem: Education: Goal: Knowledge of General Education information will improve Description: Including pain rating scale, medication(s)/side effects and non-pharmacologic comfort measures Outcome: Progressing   Problem: Health Behavior/Discharge Planning: Goal: Ability to manage health-related needs will improve Outcome: Progressing   Problem: Clinical Measurements: Goal: Ability to maintain clinical measurements within normal limits will improve Outcome: Progressing Goal: Will remain free from infection Outcome: Progressing Goal: Diagnostic test results will improve Outcome: Progressing Goal: Respiratory complications will improve Outcome: Progressing Goal: Cardiovascular complication will be avoided Outcome: Progressing   Problem: Activity: Goal: Risk for activity intolerance will decrease Outcome: Progressing   Problem: Nutrition: Goal: Adequate nutrition will be maintained Outcome: Progressing   Problem: Coping: Goal: Level of anxiety will decrease Outcome: Progressing   Problem: Elimination: Goal: Will not experience complications related to bowel motility Outcome: Progressing Goal: Will not experience complications related to urinary retention Outcome: Progressing   Problem: Pain Managment: Goal: General experience of comfort will improve and/or be controlled Outcome: Progressing   Problem: Safety: Goal: Ability to remain free from injury will improve Outcome: Progressing   Problem: Skin Integrity: Goal: Risk for impaired skin integrity will decrease Outcome: Progressing   Problem: Education: Goal: Knowledge of the prescribed therapeutic regimen will improve Outcome: Progressing   Problem: Clinical Measurements: Goal: Usual level of consciousness will be regained or maintained. Outcome: Progressing Goal: Neurologic status will improve Outcome: Progressing Goal: Ability to maintain intracranial pressure will improve Outcome: Progressing    Problem: Skin Integrity: Goal: Demonstration of wound healing without infection will improve Outcome: Progressing

## 2024-10-08 NOTE — Progress Notes (Signed)
 Assessment/Plan: The patient demonstrates an intact neurological exam and no new deficits. The skin has healed and is clean and dry. He denies pain at this time. There are no signs of infection or complication at this time. No changes the the patients condition at this time  Discharge pending bed availability.  No changes to neurosurgical care at this time  Subjective: Patient denies any concerns at this time. Denies pain Denies neurologic changes  Patient states that he just wants to be discharged.    Physical Exam   Patient appears calm and comfortable, eating his food in the chair without any concerns.    HENT:     Head: Normocephalic.     Nose: Nose normal.  Eyes:     Pupils: Pupils are equal, round, and reactive to light.  Cardiovascular:     Rate and Rhythm: Normal rate.  Pulmonary:     Effort: Pulmonary effort is normal.  Abdominal:     General: Abdomen is flat.  Musculoskeletal:     Cervical back: Normal range of motion.    Neurologic Examination  Mental status/Cognition: Alert, oriented to self, place, month and year, good attention.  Speech/language: Fluent, comprehension intact, object naming intact, repetition intact.  Cranial nerves:   CN II Pupils equal and reactive to light,    CN III,IV,VI EOM intact, no gaze preference or deviation, no nystagmus    CN V Normal sensation in V1, V2, and V3 segments bilaterally    CN VII Normal motor function; smiling, frowning, blinking. Eyes appear moisturized   CN VIII Normal hearing to speech    CN IX & X Normal palatal elevation, no uvular deviation    CN XI 5/5 head turn and 5/5 shoulder shrug bilaterally    CN XII Midline tongue protrusion   Sensory: Sensation is intact.  Motor: Motor function is intact. Coordination: Finger to Nose intact bilaterally, heel to shin intact bilaterally to assess, rapid alternating movement intact in in bilateral upper extremities.  Skin:  Skin is intact  Studies/Results: No  results found.  Ernest Romero 10/08/2024, 8:53 AM

## 2024-10-08 NOTE — Progress Notes (Signed)
 "  PROGRESS NOTE    Ernest Romero  FMW:[id number] DOB: Oct 26, 1965 DOA: 07/15/2024 PCP: [name]   Brief Narrative: Ernest Romero is a 59 y.o. male with a history of alcohol abuse, bipolar disorder, panic disorder.  Patient presented secondary to being found down and found to have evidence of a large subdural hematoma, undergoing a left frontoparietal craniotomy with evacuation of the subdural hematoma and placement of a drain and subsequent removal. Patient required further management of his craniotomy and flap, which necessitated transfer from [facility] to [facility], where neurosurgery performed a left-sided allograft cranioplasty. Repeat CT head was significant for worsening fluid, requiring a left frontal burr hole for evacuation on 10/30. 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 Patient underwent a left bone flap removal and was treated with IV antibiotics per infectious disease recommendations.   Assessment and Plan:  Recurrent subdural hematoma Patient underwent cranioplasty and burr hole procedures per neurosurgery. Per neurosurgery, likely that patient will have reaccumulation of fluid. No new neurologic changes.  MRSA infected bone flap Infectious disease consulted and patient treated with Vancomycin  IV for 6 weeks, completed on 12/22, with further recommendation to complete 2 weeks of doxycycline  PO. - Continue doxycycline ; last day of treatment is 1/6 (today)  Acute metabolic encephalopathy Likely related to above. Patient's mental status is stable, but he is requiring a recruitment consultant intermittently.  Bipolar disorder - Continue Ingressa  Drug-induced parkinsonism Akathisia Likely related to use of antipsychotics. EEG obtained and was negative for evidence of seizure activity. Propranolol  and Klonopin  started, in addition to Ingressa. - Continue  propranolol , Klonopin , Ingressa  Dysphagia Patient required an NG tube for tube feeds from 12/3 until 12/8. - SLP recommendations (12/30): PO Diet Recommendation: Dysphagia 3 (Mechanical soft);Thin liquids (Level 0) Liquid Administration via: Cup;Straw Medication Administration: Whole meds with puree Supervision: Patient able to self-feed Postural changes: Position pt fully upright for meals Oral care recommendations: Oral care QID (4x/day)  Primary hypertension - Continue propranolol , amlodipine  and losartan .  Sinus bradycardia Complicated by propranolol  use. Patient with decreased dose and hold parameters applied.  Severe malnutrition - Dietitian recommendations (12/30): Continue Dysphagia 3 diet with thin liquids. Continue Magic Cup TID. Each supplement provides 290 Kcals and 9 grams of protein. Continue Ensure Plus High Protein PO BID. Each supplement provides 350 Kcals and 20 grams of protein. Continue multivitamin, thiamine , iron, and folic acid  daily. Continue mirtazapine . RD will follow-up monthly. Please send consult if needed  Iron deficiency anemia - Continue ferrous sulfate   Aspiration pneumonia Treated with Unasyn  IV.  Acute respiratory failure with hypoxia Secondary to pneumonia. Resolved.  Alcohol use disorder Alcohol withdrawal Patient managed on CIWA with phenobarbital  taper. Withdrawal resolved. - Continue thiamine , folic acid  and multivitamin   DVT prophylaxis: Lovenox  Code Status:   Code Status: Full Code Family Communication: None at bedside Disposition Plan: Discharge pending bed availability   Consultants:  Psychiatry PM&R Neurology Infectious disease PCCM  Procedures:  Left-sided allograft cranioplasty Left frontal burr hole for evacuation of subdural hematoma Left bone flap removal Cortrak placement/removal  Antimicrobials: Vancomycin  Doxycycline     Subjective: No issues from overnight.  Objective: BP 109/84 (BP Location:  Right Arm)   Pulse (!) 55   Temp 99.8 F (37.7 C) (Oral)   Resp 19   Ht 5' 10 (1.778 m)   Wt 72.6 kg   SpO2 95%   BMI 22.97 kg/m  Examination:  General exam: Appears calm and comfortable. Sleeping in bed. Respiratory system: Respiratory effort normal.   Data Reviewed: I have personally reviewed following labs and imaging studies  CBC Lab Results  Component Value Date   WBC 5.0 09/30/2024   RBC 4.95 09/30/2024   HGB 14.7 09/30/2024   HCT 45.5 09/30/2024   MCV 91.9 09/30/2024   MCH 29.7 09/30/2024   PLT 220 09/30/2024   MCHC 32.3 09/30/2024   RDW 13.4 09/30/2024   LYMPHSABS 2.3 07/16/2024   MONOABS 0.7 07/16/2024   EOSABS 0.2 07/16/2024   BASOSABS 0.1 07/16/2024     Last metabolic panel Lab Results  Component Value Date   NA 141 09/30/2024   K 4.4 09/30/2024   CL 107 09/30/2024   CO2 24 09/30/2024   BUN 23 (H) 09/30/2024   CREATININE 0.97 09/30/2024   GLUCOSE 117 (H) 09/30/2024   GFRNONAA >60 09/30/2024   GFRAA >60 09/16/2018   CALCIUM 9.8 09/30/2024   PHOS 3.8 09/30/2024   PROT 6.1 (L) 08/28/2024   ALBUMIN  3.1 (L) 08/28/2024   BILITOT 0.7 08/28/2024   ALKPHOS 69 08/28/2024   AST 26 08/28/2024   ALT 40 08/28/2024   ANIONGAP 10 09/30/2024    GFR: Estimated Creatinine Clearance: 83.2 mL/min (by C-G formula based on SCr of 0.97 mg/dL).  No results found for this or any previous visit (from the past 240 hours).    Radiology Studies: No results found.    LOS: 85 days    Elgin Lam, MD [facility] 10/08/2024, 10:49 AM   If 7PM-7AM, please contact night-coverage [email or web address]  "

## 2024-10-09 NOTE — Progress Notes (Signed)
 Speech Language Pathology Treatment: Cognitive-Linguistic  Patient Details Name: DELLIS VOGHT MRN: [id number] DOB: 07-24-1966 Today's Date: 10/09/2024 Time: 8484-8454 SLP Time Calculation (min) (ACUTE ONLY): 30 min  Assessment / Plan / Recommendation Clinical Impression  Mr. Yandell was upset upon arrival, stating he just found out he is now a ward of the state.  He expressed reasonable concerns.  He was able to redirect his attention with encouragement. He has been showing more independence with ordering meals from the menu, needing occasional assist from staff.  He independently reached for his helmet and placed it before walking to the nurses' station to request a new menu.  OT joined for final portion of session. With Carrie's input, we addressed new goals that he can target to allow more independence in the room and around the unit.  He was generally able to recall 2/4 goals over multiple trials with cues needed for 2/4.  He is showing improved affect and facial expression.  SLP will continue to follow.     HPI HPI: Nehemias Sauceda is a 59 y.o. male who was admitted to [facility] 06/30/24 after an unresponsive episode in jail. Dx large SDH; underwent left frontoparietal craniotomy with evacuation of SDH and drain placement 9/28 (drain removed 9/29). Transferred to [facility] for management of crani/flap 10/14. Cranioplasty with bone flap replacement 10/27; worsening neuro with midline shift; underwent  left burr hole for evacuation 10/30. Bone flap removal on 11/10.  Bedside swallow eval 10/28 after change in mentation and acute change in swallowing; NPO, then advanced to dysphagia 3/thin liquids 10/31.  New orders 11/18 due to coughing with POs - rec continued Dys3/thin liquids. Repeat orders 11/28 due to acute change again in swallow. MBS completed on 11/29 - severe oropharyngeal dysphagia with aspiration of all liquids and puree solids; rec NPO. Repeat MBS 12/4- no significant change. Cortrak placed. [facility] 12/1 with  evolving postoperative changes from left sided hemicraniectomy and bone flap removal without evidence of an acute intracranial abnormality or significant residual subdural collection. MBS 12/8 rec Dys3/thin with L headturn      SLP Plan  Continue with current plan of care        Swallow Evaluation Recommendations         Recommendations                                 Continue with current plan of care    Ferdinando Lodge L. Vona, MA CCC/SLP Clinical Specialist - Acute Care SLP Acute Rehabilitation Services Office number [phone]  [name]  10/09/2024, 3:53 PM
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
## 2024-10-09 NOTE — Progress Notes (Signed)
 " PROGRESS NOTE    Ernest Romero  FMW:[id number] DOB: 1966/07/24 DOA: 07/15/2024 PCP: [name]   Brief Narrative: 59 year old with past medical history significant for alcohol abuse, bipolar disorder, panic disorder presented secondary to being found down, he was found to have a large subdural hematoma, underwent left frontoparietal craniotomy with evacuation of the subdural hematoma and placement of a drain and subsequent removal.  Patient required further management of his craniotomy and flap which necessitated transfer from [place] to [facility] where neurosurgery performed the left side allograft cranioplasty.  Repeat CT head was significant with worsening fluid requiring left frontal burr hole for evacuation on 10/30.  Hospitalization further complicated by development of an infected bone flap with culture grew  MRSA.  Patient underwent left bone flap removal and was treated with IV antibiotics for 6 weeks per infectious disease recommendation he completed 2 weeks of oral vancomycin  to complete a total of 8 weeks of antibiotics on 1/6  He is currently ward of the state now through DSS.  Currently does not have any bed offers, difficult to place.    Assessment & Plan:   Principal Problem:   Subdural hematoma (HCC) Active Problems:   Bipolar disorder (HCC)   Unable to make decisions about medical treatment due to impaired mental capacity   Alcohol withdrawal (HCC)   Essential hypertension   Protein-calorie malnutrition, severe   Acute metabolic encephalopathy   Infection of craniotomy plate  1-Recurrent subdural hematoma: - Patient underwent craniotomy and bur hole procedure per neurosurgery - Per neurosurgery patient could have reaccumulation of fluid.  No new neurological changes.  MRSA infected bone flap - Infectious disease consulted and patient was treated with IV vancomycin  for 6 weeks completed 12/20-second - He was subsequently started on 2 weeks  of doxycycline  which completed 1/6  Acute metabolic encephalopathy - Related to above, mental status is stable, he required intermittent safety sitter  Bipolar disorder: Continue Ingressa.  Drug-induced parkinsonism Akathisia Elated to use of antipsychotics EEG obtained and was negative Propranolol  and Klonopin  started in addition to Ingrezza  Continue propranolol  Klonopin  and Ingrezza   Dysphagia: -Patient required NG tube for tube feeding from 12/3 until 12/8 Transition to dysphagia 3 diet   HTN: Continue propranolol  amlodipine  and losartan   Sinus bradycardia: complicated by propranolol , dose reduced hold the parameters  Severe malnutrition: Continue with supplement  Iron deficiency anemia: Continue with ferrous sulfate  Aspiration pneumonia: completed IV Unasyn  Acute hypoxic respiratory failure secondary to pneumonia resolved  Alcohol use disorder, alcohol withdrawal Pleated CIWA and phenobarbital  taper. Continue thiamine  folic acid  and multivitamin     Nutrition Problem: Severe Malnutrition Etiology: acute illness    Signs/Symptoms: severe muscle depletion, moderate muscle depletion, percent weight loss Percent weight loss: 15 % (in 3 months)    Interventions: Ensure Enlive (each supplement provides 350kcal and 20 grams of protein), MVI, Magic cup, Refer to RD note for recommendations  Estimated body mass index is 22.97 kg/m as calculated from the following:   Height as of this encounter: 5' 10 (1.778 m).   Weight as of this encounter: 72.6 kg.   DVT prophylaxis: Lovenox  Code Status: Full code Family Communication: Care discussed with patient Disposition Plan:  Status is: Inpatient Remains inpatient appropriate because: Awaiting placement, difficult to place    Consultants:  Psych Neurosurgery Neurology  Procedures:  Left-sided allograft cranioplasty Left frontal burr hole for evacuation of subdural hematoma Left bone flap removal Cortrak  placement/remova  Antimicrobials:    Subjective:  Patient is alert, jaw movement has improved. He was agitated earlier in the morning  Objective: Vitals:   10/08/24 0803 10/08/24 1542 10/08/24 1929 10/09/24 0551  BP: 109/84 117/79 108/81 109/83  Pulse: (!) 55 62 63 (!) 58  Resp:  16 16   Temp:  97.6 F (36.4 C) 98.3 F (36.8 C) 98.3 F (36.8 C)  TempSrc:   Oral Oral  SpO2: 95% 95% 96% 95%  Weight:      Height:        Intake/Output Summary (Last 24 hours) at 10/09/2024 0813 Last data filed at 10/08/2024 1348 Gross per 24 hour  Intake 357 ml  Output --  Net 357 ml   Filed Weights   10/06/24 0403 10/07/24 0500 10/08/24 0534  Weight: 71.1 kg 71.1 kg 72.6 kg    Examination:  General exam: Appears calm and comfortable  Respiratory system: Clear to auscultation. Respiratory effort normal. Cardiovascular system: S1 & S2 heard, RRR.  Gastrointestinal system: Abdomen is nondistended, soft and nontender. No organomegaly or masses felt. Normal bowel sounds heard. Central nervous system: Alert and oriented. Extremities: Symmetric 5 x 5 power.   Data Reviewed: I have personally reviewed following labs and imaging studies  CBC: No results for input(s): WBC, NEUTROABS, HGB, HCT, MCV, PLT in the last 168 hours. Basic Metabolic Panel: No results for input(s): NA, K, CL, CO2, GLUCOSE, BUN, CREATININE, CALCIUM, MG, PHOS in the last 168 hours. GFR: Estimated Creatinine Clearance: 83.2 mL/min (by C-G formula based on SCr of 0.97 mg/dL). Liver Function Tests: No results for input(s): AST, ALT, ALKPHOS, BILITOT, PROT, ALBUMIN  in the last 168 hours. No results for input(s): LIPASE, AMYLASE in the last 168 hours. No results for input(s): AMMONIA in the last 168 hours. Coagulation Profile: No results for input(s): INR, PROTIME in the last 168 hours. Cardiac Enzymes: No results for input(s): CKTOTAL, CKMB, CKMBINDEX, TROPONINI  in the last 168 hours. BNP (last 3 results) No results for input(s): PROBNP in the last 8760 hours. HbA1C: No results for input(s): HGBA1C in the last 72 hours. CBG: No results for input(s): GLUCAP in the last 168 hours. Lipid Profile: No results for input(s): CHOL, HDL, LDLCALC, TRIG, CHOLHDL, LDLDIRECT in the last 72 hours. Thyroid Function Tests: No results for input(s): TSH, T4TOTAL, FREET4, T3FREE, THYROIDAB in the last 72 hours. Anemia Panel: No results for input(s): VITAMINB12, FOLATE, FERRITIN, TIBC, IRON, RETICCTPCT in the last 72 hours. Sepsis Labs: No results for input(s): PROCALCITON, LATICACIDVEN in the last 168 hours.  No results found for this or any previous visit (from the past 240 hours).       Radiology Studies: No results found.      Scheduled Meds:  benztropine   1 mg Oral BID   clonazePAM   0.5 mg Oral TID   docusate  100 mg Oral Daily   enoxaparin  (LOVENOX ) injection  40 mg Subcutaneous Daily   feeding supplement  237 mL Oral BID BM   ferrous sulfate   325 mg Oral Q breakfast   folic acid   1 mg Oral Daily   gabapentin   200 mg Oral TID   mirtazapine   15 mg Oral QHS   multivitamin with minerals  1 tablet Oral Daily   nicotine   14 mg Transdermal Daily   pantoprazole   40 mg Oral QHS   polyethylene glycol  17 g Oral Daily   propranolol   20 mg Oral BID   senna  2 tablet Oral Daily   sodium chloride  flush  10-40 mL  Intracatheter Q12H   thiamine   100 mg Oral Daily   valbenazine   40 mg Oral Daily   Continuous Infusions:   LOS: 86 days    Time spent: 35 Minutes    Davell Beckstead A Tong[place] Prout, MD [facility]   If 7PM-7AM, please contact night-coverage [email or web address]  10/09/2024, 8:13 AM   "

## 2024-10-09 NOTE — TOC Progression Note (Signed)
 Transition of Care [facility]) - Progression Note    Patient Details  Name: JOFFRE LUCKS MRN: [id number] Date of Birth: 1966-02-19  Transition of Care [facility]) CM/SW Contact  Rosaline JONELLE Joe, RN Phone Number: 10/09/2024, 2:47 PM  Clinical Narrative:    CM spoke with patient's care advocate in the community and he is aware that patient is a ward of the state as of September 18, 2025.  Patient has no bed offers for SNF placement at this time.  Patient has a sitter currently at the bedside for safety.  I called and spoke with HILLARY Crimes, CM with [facility] and he is unable to offer a bed for placement.  No bed offers for SNF placement at this time.  Hospital leadership is aware of barriers to placement.   Expected Discharge Plan: Skilled Nursing Facility Barriers to Discharge: Continued Medical Work up, English As A Second Language Teacher, SNF Pending bed offer               Expected Discharge Plan and Services In-house Referral: Clinical Social Work Discharge Planning Services: CM Consult                                           Social Drivers of Health (SDOH) Interventions SDOH Screenings   Food Insecurity: Patient Unable To Answer (07/16/2024)  Housing: Unknown (07/16/2024)  Transportation Needs: Patient Unable To Answer (07/16/2024)  Utilities: Patient Unable To Answer (07/16/2024)  Alcohol Screen: Low Risk (08/30/2022)  Depression (PHQ2-9): Medium Risk (08/30/2022)  Tobacco Use: Medium Risk (08/12/2024)    Readmission Risk Interventions    07/16/2024   12:23 PM  Readmission Risk Prevention Plan  Transportation Screening Complete  Medication Review (RN Care Manager) Complete  PCP or Specialist appointment within 3-5 days of discharge Complete  HRI or Home Care Consult Complete  SW Recovery Care/Counseling Consult Complete  Palliative Care Screening Not Applicable  Skilled Nursing Facility Not Applicable

## 2024-10-09 NOTE — Progress Notes (Signed)
 Physical Therapy Treatment Patient Details Name: Ernest Romero MRN: [id number] DOB: 21-May-1966 Today's Date: 10/09/2024   History of Present Illness 59 y.o. male presenting 07/16/24 from [facility] where he was admitted 06/30/[facility]. 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 On CIWA protocol. Transferred to [facility] for further management of craniotomy and flap. 10/27 s/p cranioplasty with bone flap replacement. 10/28 Change in status thought to be related to Klonopin  administration 10/28, repeat CTH shows significant increase in size of mixed attenuation subdural fluid collection underlying the L tempoparietal craniotomy site, now measuring approximately 15 mm in thickness, with associated mild mass effect and slight rightward midline shift. OR on 10/30 for SDH evacuation. [facility] 11/7 with fluid collected under bone flap. 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 Worsening dysphagia 11/29; repeat CTH stable 12/1. Cortrak placed 12/3-12/8. PMH includes alcohol use disorder, psychiatric disorder, HTN.    PT Comments  Pt is currently supervision for sit to stand and supervision to CGA for gait without an AD. Continuing to work on normalization of gait/balance and gait speed. Pt was limited due to fatigue today secondary to he just finished working with speech and OT. Pt remains at a high risk for falls. Due to pt current functional status, home set up and available assistance at home recommending skilled physical therapy services < 3 hours/day in order to address strength, balance and functional mobility to decrease risk for falls, injury, immobility, skin break down and re-hospitalization.      If plan is discharge home, recommend the following: Assistance with cooking/housework;Direct supervision/assist for medications management;Direct supervision/assist for financial management;Assist  for transportation;Help with stairs or ramp for entrance;Supervision due to cognitive status;A little help with walking and/or transfers;A little help with bathing/dressing/bathroom   Can travel by private vehicle     Yes  Equipment Recommendations  None recommended by PT    Recommendations for Other Services       Precautions / Restrictions Precautions Precautions: Fall Recall of Precautions/Restrictions: Impaired Precaution/Restrictions Comments: SBP <160, helmet (pt to wear when OOB) Restrictions Weight Bearing Restrictions Per Provider Order: No     Mobility  Bed Mobility               General bed mobility comments: pt up in chair at beginning and end of sesion    Transfers Overall transfer level: Needs assistance Equipment used: None Transfers: Sit to/from Stand Sit to Stand: Supervision      Ambulation/Gait Ambulation/Gait assistance: Contact guard assist, Supervision Gait Distance (Feet): 400 Feet Assistive device: None Gait Pattern/deviations: Decreased step length - right, Decreased step length - left, Decreased stride length, Decreased dorsiflexion - right, Decreased dorsiflexion - left Gait velocity: decreased; up to WNL Gait velocity interpretation: <1.8 ft/sec, indicate of risk for recurrent falls   General Gait Details: continues working on contralateral arm/trunk movement with gait in order to improve balance with ambulation with increased step length with cues at shoulders. Decreased need for tactile cues this session. Pt limited due to fatigue.     Balance Overall balance assessment: Mild deficits observed, not formally tested Sitting-balance support: No upper extremity supported, Feet supported Sitting balance-Leahy Scale: Good     Standing balance support: No upper extremity supported, During functional activity Standing balance-Leahy Scale: Fair Standing balance comment: supervision standing.        Communication  Communication Communication: Impaired Factors Affecting Communication:  Difficulty expressing self;Reduced clarity of speech  Cognition Arousal: Alert Behavior During Therapy: Flat affect   PT - Cognitive impairments: Memory, Problem solving, Safety/Judgement, Sequencing, Awareness, Attention       Rancho Levels of Cognitive Functioning Rancho Los Amigos Scales of Cognitive Functioning: Automatic, Appropriate: Minimal Assistance for Daily Living Skills Rancho Los Amigos Scales of Cognitive Functioning: Automatic, Appropriate: Minimal Assistance for Daily Living Skills [VII]   Following commands: Impaired Following commands impaired: Follows multi-step commands with increased time, Follows multi-step commands inconsistently    Cueing Cueing Techniques: Verbal cues, Gestural cues     General Comments General comments (skin integrity, edema, etc.): no sign/symptoms of cardiac/respiratory distress during session      Pertinent Vitals/Pain Pain Assessment Pain Assessment: No/denies pain           PT Goals (current goals can now be found in the care plan section) Acute Rehab PT Goals Patient Stated Goal: none stated PT Goal Formulation: With patient Time For Goal Achievement: 10/14/24 Potential to Achieve Goals: Fair Progress towards PT goals: Progressing toward goals    Frequency    Min 2X/week      PT Plan  Continue with current POC     Co-evaluation   Reason for Co-Treatment: Necessary to address cognition/behavior during functional activity          AM-PAC PT 6 Clicks Mobility   Outcome Measure  Help needed turning from your back to your side while in a flat bed without using bedrails?: A Little Help needed moving from lying on your back to sitting on the side of a flat bed without using bedrails?: A Little Help needed moving to and from a bed to a chair (including a wheelchair)?: A Little Help needed standing up from a chair using your arms (e.g.,  wheelchair or bedside chair)?: A Little Help needed to walk in hospital room?: A Little Help needed climbing 3-5 steps with a railing? : A Little 6 Click Score: 18    End of Session Equipment Utilized During Treatment: Gait belt Activity Tolerance: Patient tolerated treatment well Patient left: with nursing/sitter in room;with call bell/phone within reach;in chair Nurse Communication: Mobility status PT Visit Diagnosis: Other symptoms and signs involving the nervous system (R29.898);Muscle weakness (generalized) (M62.81);Unsteadiness on feet (R26.81);Other abnormalities of gait and mobility (R26.89)     Time: 1547-1600 PT Time Calculation (min) (ACUTE ONLY): 13 min  Charges:    $Therapeutic Activity: 8-22 mins PT General Charges $$ ACUTE PT VISIT: 1 Visit                    Dorothyann Maier, DPT, CLT  Acute Rehabilitation Services Office: [phone] (Secure chat preferred)    Dorothyann VEAR Maier 10/09/2024, 4:36 PM

## 2024-10-09 NOTE — Progress Notes (Signed)
 Occupational Therapy Treatment Patient Details Name: Ernest Romero MRN: [id number] DOB: 1965/10/17 Today's Date: 10/09/2024   History of present illness 59 y.o. male presenting 07/16/24 from [facility] where he was admitted 06/30/24 [facility] un[facility] episode in the jail. 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 On CIWA protocol. Transferred to [facility] for further management of craniotomy and flap. 10/27 s/p cranioplasty with bone flap replacement. 10/28 Change in status thought to be related to Klonopin  administration 10/28, repeat CTH shows significant increase in size of mixed attenuation subdural fluid collection underlying the L tempoparietal craniotomy site, now measuring approximately 15 mm in thickness, with associated mild mass effect and slight rightward midline shift. OR on 10/30 for SDH evacuation. [facility] 11/7 with fluid collected under bone flap. 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 Worsening dysphagia 11/29; repeat CTH stable 12/1. Cortrak placed 12/3-12/8. PMH includes alcohol use disorder, psychiatric disorder, HTN.   OT comments  Pt seen with SLP to address new goals that he can target to allow more independence in the room and around the unit, including goal for pt to utilize a checklist to prepare for functional tasks/mobility and to navigate environment outside of room with Supervision. Pt currently demonstrating ability to largely complete ADLs and functional mobility/transfers in room with Supervision and cues for safety. Pt participated well in session and is making good progress with OT goals. OT goals updated this day based on pt current functional level and progress toward goals. Acute OT to continue to follow. Pt has now largely progressed beyond need for intensive inpatient rehab. OT currently recommends group home or similar community-based placement with 24/7 supervision/assistance paired  with HH OT.       If plan is discharge home, recommend the following:  Supervision due to cognitive status;Direct supervision/assist for medications management;Assistance with cooking/housework;Direct supervision/assist for financial management;Assist for transportation;Assistance with feeding;A little help with walking and/or transfers;Help with stairs or ramp for entrance;A little help with bathing/dressing/bathroom   Equipment Recommendations  None recommended by OT    Recommendations for Other Services      Precautions / Restrictions Precautions Precautions: Fall Recall of Precautions/Restrictions: Impaired Precaution/Restrictions Comments: SBP <160, helmet (pt to wear when OOB) Restrictions Weight Bearing Restrictions Per Provider Order: No       Mobility Bed Mobility               General bed mobility comments: Pt gathering clothes in room with assist of SLP upon arrival and sitting in recliner at end of session.    Transfers Overall transfer level: Needs assistance Equipment used: None Transfers: Sit to/from Stand, Bed to chair/wheelchair/BSC Sit to Stand: Supervision                 Balance Overall balance assessment: Mild deficits observed, not formally tested Sitting-balance support: No upper extremity supported, Feet supported Sitting balance-Leahy Scale: Good     Standing balance support: No upper extremity supported, During functional activity Standing balance-Leahy Scale: Fair Standing balance comment: supervision in standing and dynamic standing for safety                           ADL either performed or assessed with clinical judgement   ADL Overall ADL's : Needs assistance/impaired     Grooming: Supervision/safety;Standing;Cueing for safety           Upper Body Dressing :  Supervision/safety;Standing;Cueing for safety   Lower Body Dressing: Supervision/safety;Sit to/from stand;Cueing for safety   Toilet Transfer:  Supervision/safety;Ambulation;Regular Toilet;Grab bars   Toileting- Clothing Manipulation and Hygiene: Supervision/safety;Sit to/from stand       Functional mobility during ADLs: Supervision/safety;Cueing for safety (without AD in room)      Extremity/Trunk Assessment Upper Extremity Assessment Upper Extremity Assessment: RUE deficits/detail;LUE deficits/detail RUE Deficits / Details: impaired coordination LUE Deficits / Details: impaired coordination   Lower Extremity Assessment Lower Extremity Assessment: Defer to PT evaluation        Vision       Perception     Praxis     Communication Communication Communication: Impaired Factors Affecting Communication: Difficulty expressing self;Reduced clarity of speech   Cognition Arousal: Alert Behavior During Therapy: [facility] for tasks assessed/performed, Flat affect (Pt continues to present with largely flat affect, but also intermittently smiling and laughing appropriately this session) Cognition: Cognition impaired   Orientation impairments:  (orientation questions not aasked this session) Awareness: Intellectual awareness intact, Online awareness impaired Memory impairment (select all impairments): Working civil service fast streamer, Short-term memory Attention impairment (select first level of impairment): Divided attention Executive functioning impairment (select all impairments): Organization, Sequencing, Reasoning, Problem solving OT - Cognition Comments: Pt particiapted well in session> pt demonstrating ability to Independently remember 2/4 items discussed toward beginning of session and able to remember 1 more with one cue at middle and end of session at middle and end of session.                 Following commands: Impaired Following commands impaired: Follows multi-step commands with increased time, Follows multi-step commands inconsistently      Cueing   Cueing Techniques: Verbal cues, Gestural cues, Visual cues  Exercises       Shoulder Instructions       General Comments no sign/symptoms of cardiac/respiratory distress during session    Pertinent Vitals/ Pain       Pain Assessment Pain Assessment: No/denies pain  Home Living                                          Prior Functioning/Environment              Frequency  Min 2X/week        Progress Toward Goals  OT Goals(current goals can now be found in the care plan section)  Progress towards OT goals: Progressing toward goals;Goals updated  ADL Goals Pt Will Perform Grooming: with modified independence;standing Pt Will Perform Upper Body Bathing: with set-up;sitting Pt Will Perform Lower Body Dressing: with modified independence;sit to/from stand Pt Will Transfer to Toilet: with modified independence;ambulating;regular height toilet Additional ADL Goal #2: Patient will demonstrate ability to participate in home exercise program to address jaw tremor and strengthening with Supervision and written HEP provided. Additional ADL Goal #3: Patient will demonstrate ability to utilize simple 3-4 item checklist (i.e. put on helmet, put on grip socks) to prepare for functional tasks and mobility with Supervision to increase safety and independence with funcitonal tasks. Additional ADL Goal #4: Patient will demonstrate ability to complete orientation activity with 3 or more steps outside of room with Supervision to increase safety and independence with functional tasks.  Plan      Co-evaluation    PT/OT/SLP Co-Evaluation/Treatment: Yes Reason for Co-Treatment: Necessary to address cognition/behavior during functional activity   OT goals addressed during session:  ADL's and self-care SLP goals addressed during session: Cognition;Communication    AM-PAC OT 6 Clicks Daily Activity     Outcome Measure   Help from another person eating meals?: None Help from another person taking care of personal grooming?: A Little Help from  another person toileting, which includes using toliet, bedpan, or urinal?: A Little Help from another person bathing (including washing, rinsing, drying)?: A Little Help from another person to put on and taking off regular upper body clothing?: A Little Help from another person to put on and taking off regular lower body clothing?: A Little 6 Click Score: 19    End of Session Equipment Utilized During Treatment: Other (comment) (helmet)  OT Visit Diagnosis: Other abnormalities of gait and mobility (R26.89);Muscle weakness (generalized) (M62.81);Other symptoms and signs involving the nervous system (R29.898);Cognitive communication deficit (R41.841);Other symptoms and signs involving cognitive function   Activity Tolerance Patient tolerated treatment well   Patient Left in chair;with call bell/phone within reach;Other (comment);with nursing/sitter in room (with PT entering room to work with pt)   Nurse Communication Mobility status;Other (comment) (OT POC)        Time: 8476-8450 OT Time Calculation (min): 26 min  Charges: OT General Charges $OT Visit: 1 Visit OT Treatments $Self Care/Home Management : 8-22 mins  Margarie Rockey HERO., OTR/L, MA Acute Rehab [phone]   Margarie FORBES Horns 10/09/2024, 6:50 PM

## 2024-10-09 NOTE — Plan of Care (Signed)
  Problem: Education: Goal: Knowledge of General Education information will improve Description: Including pain rating scale, medication(s)/side effects and non-pharmacologic comfort measures Outcome: Progressing   Problem: Health Behavior/Discharge Planning: Goal: Ability to manage health-related needs will improve Outcome: Progressing   Problem: Clinical Measurements: Goal: Ability to maintain clinical measurements within normal limits will improve Outcome: Progressing Goal: Will remain free from infection Outcome: Progressing Goal: Diagnostic test results will improve Outcome: Progressing Goal: Respiratory complications will improve Outcome: Progressing Goal: Cardiovascular complication will be avoided Outcome: Progressing   Problem: Activity: Goal: Risk for activity intolerance will decrease Outcome: Progressing   Problem: Nutrition: Goal: Adequate nutrition will be maintained Outcome: Progressing   Problem: Coping: Goal: Level of anxiety will decrease Outcome: Progressing   Problem: Elimination: Goal: Will not experience complications related to bowel motility Outcome: Progressing Goal: Will not experience complications related to urinary retention Outcome: Progressing   Problem: Pain Managment: Goal: General experience of comfort will improve and/or be controlled Outcome: Progressing   Problem: Safety: Goal: Ability to remain free from injury will improve Outcome: Progressing   Problem: Skin Integrity: Goal: Risk for impaired skin integrity will decrease Outcome: Progressing   Problem: Education: Goal: Knowledge of the prescribed therapeutic regimen will improve Outcome: Progressing   Problem: Clinical Measurements: Goal: Usual level of consciousness will be regained or maintained. Outcome: Progressing Goal: Neurologic status will improve Outcome: Progressing Goal: Ability to maintain intracranial pressure will improve Outcome: Progressing    Problem: Skin Integrity: Goal: Demonstration of wound healing without infection will improve Outcome: Progressing

## 2024-10-10 LAB — CBC
HCT: 43.9 % (ref 39.0–52.0)
Hemoglobin: 14.2 g/dL (ref 13.0–17.0)
MCH: 30 pg (ref 26.0–34.0)
MCHC: 32.3 g/dL (ref 30.0–36.0)
MCV: 92.6 fL (ref 80.0–100.0)
Platelets: 204 K/uL (ref 150–400)
RBC: 4.74 MIL/uL (ref 4.22–5.81)
RDW: 13.6 % (ref 11.5–15.5)
WBC: 5.1 K/uL (ref 4.0–10.5)
nRBC: 0 % (ref 0.0–0.2)

## 2024-10-10 LAB — BASIC METABOLIC PANEL WITH GFR
Anion gap: 8 (ref 5–15)
BUN: 26 mg/dL — ABNORMAL HIGH (ref 6–20)
CO2: 27 mmol/L (ref 22–32)
Calcium: 9.4 mg/dL (ref 8.9–10.3)
Chloride: 107 mmol/L (ref 98–111)
Creatinine, Ser: 1.03 mg/dL (ref 0.61–1.24)
GFR, Estimated: >60 mL/min
Glucose, Bld: 100 mg/dL — ABNORMAL HIGH (ref 70–99)
Potassium: 4.2 mmol/L (ref 3.5–5.1)
Sodium: 142 mmol/L (ref 135–145)

## 2024-10-10 NOTE — Progress Notes (Signed)
 " PROGRESS NOTE    Ernest Romero  FMW:[id number] DOB: 1966-04-25 DOA: 07/15/2024 PCP: [name]   Brief Narrative: 59 year old with past medical history significant for alcohol abuse, bipolar disorder, panic disorder presented secondary to being found down, he was found to have a large subdural hematoma, underwent left frontoparietal craniotomy with evacuation of the subdural hematoma and placement of a drain and subsequent removal.  Patient required further management of his craniotomy and flap which necessitated transfer from [place] to [facility] where neurosurgery performed the left side allograft cranioplasty.  Repeat CT head was significant with worsening fluid requiring left frontal burr hole for evacuation on 10/30.  Hospitalization further complicated by development of an infected bone flap with culture grew  MRSA.  Patient underwent left bone flap removal and was treated with IV antibiotics for 6 weeks per infectious disease recommendation he completed 2 weeks of oral vancomycin  to complete a total of 8 weeks of antibiotics on 1/6  He is currently ward of the state now through DSS.  Currently does not have any bed offers, difficult to place.    Assessment & Plan:   Principal Problem:   Subdural hematoma (HCC) Active Problems:   Bipolar disorder (HCC)   Unable to make decisions about medical treatment due to impaired mental capacity   Alcohol withdrawal (HCC)   Essential hypertension   Protein-calorie malnutrition, severe   Acute metabolic encephalopathy   Infection of craniotomy plate  1-Recurrent subdural hematoma: - Patient underwent craniotomy and bur hole procedure per neurosurgery - Per neurosurgery patient could have reaccumulation of fluid.  No new neurological changes. Stable.   MRSA infected bone flap - Infectious disease consulted and patient was treated with IV vancomycin  for 6 weeks completed 12/20-second - He was subsequently started  on 2 weeks of doxycycline  which completed 1/6  Acute metabolic encephalopathy - Related to above, mental status is stable, he required intermittent safety sitter  Bipolar disorder: Continue Ingressa, cogentin .   Drug-induced parkinsonism Akathisia Likely related  to use of antipsychotics, EEG obtained and was negative. Continue propranolol , Klonopin , and Ingrezza   Dysphagia: -Patient required NG tube for tube feeding from 12/3 until 12/8 Transition to dysphagia 3 diet   HTN: Continue propranolol  amlodipine  and losartan   Sinus bradycardia: complicated by propranolol , dose reduced hold the parameters  Severe malnutrition: Continue with supplement  Iron deficiency anemia: Continue with ferrous sulfate  Aspiration pneumonia: completed IV Unasyn  Acute hypoxic respiratory failure secondary to pneumonia resolved  Alcohol use disorder, alcohol withdrawal Pleated CIWA and phenobarbital  taper. Continue thiamine  folic acid  and multivitamin     Nutrition Problem: Severe Malnutrition Etiology: acute illness    Signs/Symptoms: severe muscle depletion, moderate muscle depletion, percent weight loss Percent weight loss: 15 % (in 3 months)    Interventions: Ensure Enlive (each supplement provides 350kcal and 20 grams of protein), MVI, Magic cup, Refer to RD note for recommendations  Estimated body mass index is 23.22 kg/m as calculated from the following:   Height as of this encounter: 5' 10 (1.778 m).   Weight as of this encounter: 73.4 kg.   DVT prophylaxis: Lovenox  Code Status: Full code Family Communication: Care discussed with patient Disposition Plan:  Status is: Inpatient Remains inpatient appropriate because: Awaiting placement, difficult to place    Consultants:  Psych Neurosurgery Neurology  Procedures:  Left-sided allograft cranioplasty Left frontal burr hole for evacuation of subdural hematoma Left bone flap removal Cortrak  placement/remova  Antimicrobials:    Subjective: Patient is  alert, denies pain.   Objective: Vitals:   10/09/24 1838 10/09/24 2000 10/10/24 0048 10/10/24 0408  BP: 118/78 (P) 112/80 101/74 100/70  Pulse: 71 (P) 67  (!) 59  Resp: 20 (P) 18 18 17   Temp: 97.9 F (36.6 C) (P) 98.2 F (36.8 C) 97.9 F (36.6 C) 98 F (36.7 C)  TempSrc: Oral (P) Oral Skin Skin  SpO2: 97% (P) 94% 97% 96%  Weight:    73.4 kg  Height:        Intake/Output Summary (Last 24 hours) at 10/10/2024 0719 Last data filed at 10/09/2024 1845 Gross per 24 hour  Intake 238 ml  Output --  Net 238 ml   Filed Weights   10/07/24 0500 10/08/24 0534 10/10/24 0408  Weight: 71.1 kg 72.6 kg 73.4 kg    Examination:  General exam: NAD Respiratory system: CTA Cardiovascular system: S 1, S 2 RRR Gastrointestinal system:  Alert Extremities: Symmetric 5 x 5 power.   Data Reviewed: I have personally reviewed following labs and imaging studies  CBC: No results for input(s): WBC, NEUTROABS, HGB, HCT, MCV, PLT in the last 168 hours. Basic Metabolic Panel: No results for input(s): NA, K, CL, CO2, GLUCOSE, BUN, CREATININE, CALCIUM, MG, PHOS in the last 168 hours. GFR: Estimated Creatinine Clearance: 83.6 mL/min (by C-G formula based on SCr of 0.97 mg/dL). Liver Function Tests: No results for input(s): AST, ALT, ALKPHOS, BILITOT, PROT, ALBUMIN  in the last 168 hours. No results for input(s): LIPASE, AMYLASE in the last 168 hours. No results for input(s): AMMONIA in the last 168 hours. Coagulation Profile: No results for input(s): INR, PROTIME in the last 168 hours. Cardiac Enzymes: No results for input(s): CKTOTAL, CKMB, CKMBINDEX, TROPONINI in the last 168 hours. BNP (last 3 results) No results for input(s): PROBNP in the last 8760 hours. HbA1C: No results for input(s): HGBA1C in the last 72 hours. CBG: No results for input(s): GLUCAP in the last  168 hours. Lipid Profile: No results for input(s): CHOL, HDL, LDLCALC, TRIG, CHOLHDL, LDLDIRECT in the last 72 hours. Thyroid Function Tests: No results for input(s): TSH, T4TOTAL, FREET4, T3FREE, THYROIDAB in the last 72 hours. Anemia Panel: No results for input(s): VITAMINB12, FOLATE, FERRITIN, TIBC, IRON, RETICCTPCT in the last 72 hours. Sepsis Labs: No results for input(s): PROCALCITON, LATICACIDVEN in the last 168 hours.  No results found for this or any previous visit (from the past 240 hours).       Radiology Studies: No results found.      Scheduled Meds:  benztropine   1 mg Oral BID   clonazePAM   0.5 mg Oral TID   docusate  100 mg Oral Daily   enoxaparin  (LOVENOX ) injection  40 mg Subcutaneous Daily   feeding supplement  237 mL Oral BID BM   ferrous sulfate   325 mg Oral Q breakfast   folic acid   1 mg Oral Daily   gabapentin   200 mg Oral TID   mirtazapine   15 mg Oral QHS   multivitamin with minerals  1 tablet Oral Daily   nicotine   14 mg Transdermal Daily   pantoprazole   40 mg Oral QHS   polyethylene glycol  17 g Oral Daily   propranolol   20 mg Oral BID   senna  2 tablet Oral Daily   sodium chloride  flush  10-40 mL Intracatheter Q12H   thiamine   100 mg Oral Daily   valbenazine   40 mg Oral Daily   Continuous Infusions:   LOS: 87 days  Time spent: 35 Minutes    Milina Pagett DELENA Lore, MD [facility]   If 7PM-7AM, please contact night-coverage [email or web address]  10/10/2024, 7:19 AM   "

## 2024-10-11 LAB — GLUCOSE, CAPILLARY: Glucose-Capillary: 120 mg/dL — ABNORMAL HIGH (ref 70–99)

## 2024-10-11 NOTE — Progress Notes (Signed)
 Physical Therapy Treatment Patient Details Name: Ernest Romero MRN: [id number] DOB: 06-20-1966 Today's Date: 10/11/2024   History of Present Illness 59 y.o. male presenting 07/16/24 from [facility] where he was admitted 06/30/24 for [facility] in [facility]. 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 On CIWA protocol. Transferred to [facility] for further management of craniotomy and flap. 10/27 s/p cranioplasty with bone flap replacement. 10/28 Change in status thought to be related to Klonopin  administration 10/28, repeat CTH shows significant increase in size of mixed attenuation subdural fluid collection underlying the L tempoparietal craniotomy site, now measuring approximately 15 mm in thickness, with associated mild mass effect and slight rightward midline shift. OR on 10/30 for SDH evacuation. [facility] 11/7 with fluid collected under bone flap. 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 Worsening dysphagia 11/29; repeat CTH stable 12/1. Cortrak placed 12/3-12/8. PMH includes alcohol use disorder, psychiatric disorder, HTN.    PT Comments  Pt received in supine and agreeable to session. Pt able to complete bed mobility and transfers with supervision and cues for awareness and safety. Pt able to perform gait trial without AD and up to CGA for safety with head turns. Pt able to complete stair trial and perform BLE cone taps with good activity tolerance. Pt demonstrates increased instability with LLE single leg stance requiring intermittent min A to correct LOB. Improved stability with increased reps. Pt continues to benefit from PT services to progress toward functional mobility goals.    If plan is discharge home, recommend the following: Assistance with cooking/housework;Direct supervision/assist for medications management;Direct supervision/assist for financial management;Assist for transportation;Help with  stairs or ramp for entrance;Supervision due to cognitive status;A little help with walking and/or transfers;A little help with bathing/dressing/bathroom   Can travel by private vehicle     Yes  Equipment Recommendations  None recommended by PT    Recommendations for Other Services       Precautions / Restrictions Precautions Precautions: Fall Recall of Precautions/Restrictions: Impaired Precaution/Restrictions Comments: SBP <160, helmet (pt to wear when OOB) Restrictions Weight Bearing Restrictions Per Provider Order: No     Mobility  Bed Mobility Overal bed mobility: Needs Assistance Bed Mobility: Supine to Sit     Supine to sit: Supervision          Transfers Overall transfer level: Needs assistance Equipment used: None Transfers: Sit to/from Stand Sit to Stand: Supervision           General transfer comment: cues for safety and to don helmet    Ambulation/Gait Ambulation/Gait assistance: Contact guard assist Gait Distance (Feet): 200 Feet Assistive device: None Gait Pattern/deviations: Decreased stride length, Step-through pattern, Narrow base of support       General Gait Details: Pt demonstrates slightly unsteady gait, but no overt LOB. Pt able to correct instability with stepping strategy. cues for slightly wider stance. CGA for safety with balance challenges   Stairs Stairs: Yes Stairs assistance: Min assist Stair Management: No rails, One rail Right, Alternating pattern, Forwards Number of Stairs: 4 General stair comments: initial mild LOB requiring UE support and min A to correct due to some impulsivity. Progresses to CGA with improved pacing and UE support   Wheelchair Mobility     Tilt Bed    Modified Rankin (Stroke Patients Only)       Balance Overall balance assessment: Mild deficits observed, not formally tested Sitting-balance support: No upper extremity supported,  Feet supported Sitting balance-Leahy Scale: Good      Standing balance support: No upper extremity supported, During functional activity Standing balance-Leahy Scale: Fair Standing balance comment: supervision in standing and dynamic standing for safety                            Communication Communication Communication: Impaired Factors Affecting Communication: Difficulty expressing self;Reduced clarity of speech  Cognition Arousal: Alert Behavior During Therapy: WFL for tasks assessed/performed   PT - Cognitive impairments: Memory, Problem solving, Safety/Judgement, Sequencing, Awareness, Attention                   Rancho Levels of Cognitive Functioning Rancho Los Amigos Scales of Cognitive Functioning: Automatic, Appropriate: Minimal Assistance for Daily Living Skills Rancho Los Amigos Scales of Cognitive Functioning: Automatic, Appropriate: Minimal Assistance for Daily Living Skills [VII]   Following commands: Impaired Following commands impaired: Follows multi-step commands with increased time, Follows multi-step commands inconsistently    Cueing Cueing Techniques: Verbal cues, Gestural cues, Visual cues  Exercises Other Exercises Other Exercises: BLE cone taps without UE support and intermittent min A for balance    General Comments        Pertinent Vitals/Pain Pain Assessment Pain Assessment: No/denies pain     PT Goals (current goals can now be found in the care plan section) Acute Rehab PT Goals Patient Stated Goal: none stated PT Goal Formulation: With patient Time For Goal Achievement: 10/14/24 Progress towards PT goals: Progressing toward goals    Frequency    Min 2X/week      PT Plan      Co-evaluation PT/OT/SLP Co-Evaluation/Treatment: Yes Reason for Co-Treatment: For patient/therapist safety;Necessary to address cognition/behavior during functional activity;To address functional/ADL transfers PT goals addressed during session: Mobility/safety with mobility OT goals addressed  during session: Other (comment);ADL's and self-care;Proper use of Adaptive equipment and DME (Cognition)      AM-PAC PT 6 Clicks Mobility   Outcome Measure  Help needed turning from your back to your side while in a flat bed without using bedrails?: A Little Help needed moving from lying on your back to sitting on the side of a flat bed without using bedrails?: A Little Help needed moving to and from a bed to a chair (including a wheelchair)?: A Little Help needed standing up from a chair using your arms (e.g., wheelchair or bedside chair)?: A Little Help needed to walk in hospital room?: A Little Help needed climbing 3-5 steps with a railing? : A Little 6 Click Score: 18    End of Session Equipment Utilized During Treatment: Gait belt Activity Tolerance: Patient tolerated treatment well Patient left: Other (comment) (in 3W gym with OT) Nurse Communication: Mobility status PT Visit Diagnosis: Other symptoms and signs involving the nervous system (R29.898);Muscle weakness (generalized) (M62.81);Unsteadiness on feet (R26.81);Other abnormalities of gait and mobility (R26.89)     Time: 8887-8864 PT Time Calculation (min) (ACUTE ONLY): 23 min  Charges:    $Gait Training: 8-22 mins PT General Charges $$ ACUTE PT VISIT: 1 Visit                    Darryle George, PTA Acute Rehabilitation Services Secure Chat Preferred  [phone]    Darryle George 10/11/2024, 12:37 PM

## 2024-10-11 NOTE — Progress Notes (Signed)
 Occupational Therapy Treatment Patient Details Name: Ernest Romero MRN: [id number] DOB: 02-Nov-1965 Today's Date: 10/11/2024   History of present illness 59 y.o. male presenting 07/16/24 from [facility] where he was admitted 06/30/24 for unresponsive episode in the jail. 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 On CIWA protocol. Transferred to [facility] for further management of craniotomy and flap. 10/27 s/p cranioplasty with bone flap replacement. 10/28 Change in status thought to be related to Klonopin  administration 10/28, repeat CTH shows significant increase in size of mixed attenuation subdural fluid collection underlying the L tempoparietal craniotomy site, now measuring approximately 15 mm in thickness, with associated mild mass effect and slight rightward midline shift. OR on 10/30 for SDH evacuation. [facility] 11/7 with fluid collected under bone flap. 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 Worsening dysphagia 11/29; repeat CTH stable 12/1. Cortrak placed 12/3-12/8. PMH includes alcohol use disorder, psychiatric disorder, HTN.   OT comments  OT session focused on training in and therapeutic activities for improved safety, independence, and cognition during functional and therapeutic tasks. Pt largely demonstrating ability to complete ADLs with Supervision for cues for safety and functional transfers/mobility without an AD with Supervision to Contact guard assist for safety. Pt able to complete orientation task in hallways while consistently following 1-step commands and following 2-step commands in 2/5 opportunities with increased time. Pt able to scan for and identify his room number with one reminder of his room number. During this session, pt also created a checklist of items to remember prior to mobility (helmet, socks or shoes, and covering backside) on a poster board that was posted in his room on wall  opposite of bed. Pt able to create checklist with one initatial cue for orientation of poster board and one cue to write the 3 items discussed in prior session and prior to orientation task this session. Pt remembered 3/3 items while making poster without cues. Pt participated well in session and is making progress toward goals. Acute OT to continue to follow. OT currently recommends post-acute group home or similar community-based placement with 24/7 supervision/assistance paired with HH OT.       If plan is discharge home, recommend the following:  Supervision due to cognitive status;Direct supervision/assist for medications management;Assistance with cooking/housework;Direct supervision/assist for financial management;Assist for transportation;Assistance with feeding;A little help with walking and/or transfers;Help with stairs or ramp for entrance;A little help with bathing/dressing/bathroom   Equipment Recommendations  None recommended by OT    Recommendations for Other Services      Precautions / Restrictions Precautions Precautions: Fall Recall of Precautions/Restrictions: Impaired Precaution/Restrictions Comments: SBP <160, helmet (pt to wear when OOB) Restrictions Weight Bearing Restrictions Per Provider Order: No       Mobility Bed Mobility Overal bed mobility: Needs Assistance Bed Mobility: Supine to Sit     Supine to sit: Supervision          Transfers Overall transfer level: Needs assistance Equipment used: None Transfers: Sit to/from Stand, Bed to chair/wheelchair/BSC Sit to Stand: Supervision           General transfer comment: cues for safety and to don helmet     Balance Overall balance assessment: Mild deficits observed, not formally tested Sitting-balance support: No upper extremity supported, Feet supported Sitting balance-Leahy Scale: Good     Standing balance support: No upper extremity supported, During functional activity Standing  balance-Leahy Scale: Fair Standing balance comment:  supervision in standing and dynamic standing for safety                           ADL either performed or assessed with clinical judgement   ADL Overall ADL's : Needs assistance/impaired                     Lower Body Dressing: Supervision/safety;Sit to/from stand;Cueing for safety   Toilet Transfer: Supervision/safety;Ambulation Toilet Transfer Details (indicate cue type and reason): simulated bed to chair         Functional mobility during ADLs: Supervision/safety;Contact guard assist;Cueing for safety (without an AD; Supervision for short distances in room and CGA for safety for longer distances in hallway) General ADL Comments: Pt requiring one cue to don helment prior to ambulation. Pt donned non-skid socks with Supervision and no cues prior to ambulation. Pt demonstrating ability to consistently follow 1-step commands and able to follow 2-step commands in 2/5 opportunities with increased time during orientation activity in hallway. Pt able to scan for and identify his room number with cues.    Extremity/Trunk Assessment Upper Extremity Assessment Upper Extremity Assessment: Right hand dominant;RUE deficits/detail;LUE deficits/detail RUE Deficits / Details: impaired coordination; pt demonstrating ability to write checklist on poster board using markers with Set up-Supervision with deficits present but with writing legible RUE Coordination: decreased fine motor;decreased gross motor   Lower Extremity Assessment Lower Extremity Assessment: Defer to PT evaluation        Vision       Perception     Praxis     Communication Communication Communication: Impaired Factors Affecting Communication: Difficulty expressing self;Reduced clarity of speech   Cognition Arousal: Alert Behavior During Therapy: [facility] for tasks assessed/performed Cognition: Cognition impaired   Orientation impairments:  (orientation  questions not asked this session; but with pt noted to be oriented to time, place, and situation) Awareness: Intellectual awareness intact, Online awareness impaired Memory impairment (select all impairments): Working civil service fast streamer, Short-term memory (Pt with continued deficits but also with noted improvements in short-term memory with pt recalling previously discussed plan for OT session) Attention impairment (select first level of impairment): Divided attention Executive functioning impairment (select all impairments): Organization, Sequencing, Reasoning, Problem solving OT - Cognition Comments: Pt particiapted well in session. Pt demonstrating ability to Independently remember 2/3 items discussed during prior session on 1/7 at beginning of today's session and able to remember 3/3 at end of session today.                 Following commands: Impaired Following commands impaired: Follows multi-step commands with increased time, Follows multi-step commands inconsistently, Only follows one step commands consistently, Follows one step commands with increased time      Cueing   Cueing Techniques: Verbal cues, Gestural cues, Visual cues  Exercises      Shoulder Instructions       General Comments During session, pt created a checklist of items to remember prior to mobility (helmet, socks or shoes, and covering backside) on a posterboard that was posted in his room on wall opposite of bed. Pt able to create poster with with one initatial cue for orientation of poster board and one cue to write the 3 items previously discussed. Pt remembered 3/3 items while making poster without cues.    Pertinent Vitals/ Pain       Pain Assessment Pain Assessment: No/denies pain  Home Living  Prior Functioning/Environment              Frequency  Min 2X/week        Progress Toward Goals  OT Goals(current goals can now be found in the care  plan section)  Progress towards OT goals: Progressing toward goals  Acute Rehab OT Goals Patient Stated Goal: to be more independent and be able to leave his room more with therapy  Plan      Co-evaluation    PT/OT/SLP Co-Evaluation/Treatment: Yes Reason for Co-Treatment: For patient/therapist safety;Necessary to address cognition/behavior during functional activity;To address functional/ADL transfers PT goals addressed during session: Mobility/safety with mobility OT goals addressed during session: Other (comment);ADL's and self-care;Proper use of Adaptive equipment and DME (Cognition)      AM-PAC OT 6 Clicks Daily Activity     Outcome Measure   Help from another person eating meals?: None Help from another person taking care of personal grooming?: A Little Help from another person toileting, which includes using toliet, bedpan, or urinal?: A Little Help from another person bathing (including washing, rinsing, drying)?: A Little Help from another person to put on and taking off regular upper body clothing?: A Little Help from another person to put on and taking off regular lower body clothing?: A Little 6 Click Score: 19    End of Session Equipment Utilized During Treatment: Gait belt;Other (comment) (helmet)  OT Visit Diagnosis: Other abnormalities of gait and mobility (R26.89);Muscle weakness (generalized) (M62.81);Other symptoms and signs involving the nervous system (R29.898);Cognitive communication deficit (R41.841);Other symptoms and signs involving cognitive function Symptoms and signs involving cognitive functions:  (SDH s/p L frontoparietal craniotomy with evacuation of SDH)   Activity Tolerance Patient tolerated treatment well   Patient Left in chair;with call bell/phone within reach;with nursing/sitter in room   Nurse Communication Mobility status;Other (comment) (Pt participated well. Pt going to/went to 3W gym with OT and PT.)        Time: 8883-8855 OT Time  Calculation (min): 28 min  Charges: OT General Charges $OT Visit: 1 Visit OT Treatments $Self Care/Home Management : 8-22 mins  Margarie Rockey HERO., OTR/L, MA Acute Rehab [phone]   Margarie FORBES Horns 10/11/2024, 5:27 PM

## 2024-10-11 NOTE — Progress Notes (Signed)
 " PROGRESS NOTE    Ernest Romero  FMW:[id number] DOB: August 29, 1966 DOA: 07/15/2024 PCP: [name]   Brief Narrative: 59 year old with past medical history significant for alcohol abuse, bipolar disorder, panic disorder presented secondary to being found down, he was found to have a large subdural hematoma, underwent left frontoparietal craniotomy with evacuation of the subdural hematoma and placement of a drain and subsequent removal.  Patient required further management of his craniotomy and flap which necessitated transfer from [place] to [facility] where neurosurgery performed the left side allograft cranioplasty.  Repeat CT head was significant with worsening fluid requiring left frontal burr hole for evacuation on 10/30.  Hospitalization further complicated by development of an infected bone flap with culture grew  MRSA.  Patient underwent left bone flap removal and was treated with IV antibiotics for 6 weeks per infectious disease recommendation he completed 2 weeks of oral vancomycin  to complete a total of 8 weeks of antibiotics on 1/6  He is currently ward of the state now through DSS.  Currently does not have any bed offers, difficult to place.    Assessment & Plan:   Principal Problem:   Subdural hematoma (HCC) Active Problems:   Bipolar disorder (HCC)   Unable to make decisions about medical treatment due to impaired mental capacity   Alcohol withdrawal (HCC)   Essential hypertension   Protein-calorie malnutrition, severe   Acute metabolic encephalopathy   Infection of craniotomy plate  1-Recurrent subdural hematoma: - Patient underwent craniotomy and bur hole procedure per neurosurgery - Per neurosurgery patient could have reaccumulation of fluid.  No new neurological changes. Stable.   MRSA infected bone flap - Infectious disease consulted and patient was treated with IV vancomycin  for 6 weeks completed 12/20-second - He was subsequently started  on 2 weeks of doxycycline  which completed 1/6  Acute metabolic encephalopathy - Related to above, mental status is stable, he required intermittent safety sitter  Bipolar disorder: Continue Ingressa, cogentin .   Drug-induced parkinsonism Akathisia Likely related  to use of antipsychotics, EEG obtained and was negative. Continue propranolol , Klonopin , and Ingrezza   Dysphagia: -Patient required NG tube for tube feeding from 12/3 until 12/8 Transition to dysphagia 3 diet   HTN: Continue propranolol , amlodipine  and losartan   Sinus bradycardia: complicated by propranolol , dose reduced hold the parameters  Severe malnutrition: Continue with supplement  Iron deficiency anemia: Continue with ferrous sulfate  Aspiration pneumonia: completed IV Unasyn  Acute hypoxic respiratory failure secondary to pneumonia resolved  Alcohol use disorder, alcohol withdrawal Pleated CIWA and phenobarbital  taper. Continue thiamine  folic acid  and multivitamin     Nutrition Problem: Severe Malnutrition Etiology: acute illness    Signs/Symptoms: severe muscle depletion, moderate muscle depletion, percent weight loss Percent weight loss: 15 % (in 3 months)    Interventions: Ensure Enlive (each supplement provides 350kcal and 20 grams of protein), MVI, Magic cup, Refer to RD note for recommendations  Estimated body mass index is 23.22 kg/m as calculated from the following:   Height as of this encounter: 5' 10 (1.778 m).   Weight as of this encounter: 73.4 kg.   DVT prophylaxis: Lovenox  Code Status: Full code Family Communication: Care discussed with patient Disposition Plan:  Status is: Inpatient Remains inpatient appropriate because: Awaiting placement, difficult to place    Consultants:  Psych Neurosurgery Neurology  Procedures:  Left-sided allograft cranioplasty Left frontal burr hole for evacuation of subdural hematoma Left bone flap removal Cortrak  placement/remova  Antimicrobials:    Subjective: Tremors stable.  No new complaints.  He ambulated in the hall with ot today   Objective: Vitals:   10/10/24 0048 10/10/24 0408 10/10/24 2108 10/11/24 1146  BP: 101/74 100/70 130/77 108/80  Pulse:  (!) 59 72 61  Resp: 18 17 20 18   Temp: 97.9 F (36.6 C) 98 F (36.7 C) 98.1 F (36.7 C) 97.9 F (36.6 C)  TempSrc: Skin Skin Axillary   SpO2: 97% 96% 94% 97%  Weight:  73.4 kg    Height:        Intake/Output Summary (Last 24 hours) at 10/11/2024 1330 Last data filed at 10/10/2024 2300 Gross per 24 hour  Intake 200 ml  Output --  Net 200 ml   Filed Weights   10/07/24 0500 10/08/24 0534 10/10/24 0408  Weight: 71.1 kg 72.6 kg 73.4 kg    Examination:  General exam: NAD Respiratory system: CTA Cardiovascular system: S 1, S 2 RRR Gastrointestinal system: Alert Extremities: no edema   Data Reviewed: I have personally reviewed following labs and imaging studies  CBC: Recent Labs  Lab 10/10/24 0849  WBC 5.1  HGB 14.2  HCT 43.9  MCV 92.6  PLT 204   Basic Metabolic Panel: Recent Labs  Lab 10/10/24 0849  NA 142  K 4.2  CL 107  CO2 27  GLUCOSE 100*  BUN 26*  CREATININE 1.03  CALCIUM 9.4   GFR: Estimated Creatinine Clearance: 78.7 mL/min (by C-G formula based on SCr of 1.03 mg/dL). Liver Function Tests: No results for input(s): AST, ALT, ALKPHOS, BILITOT, PROT, ALBUMIN  in the last 168 hours. No results for input(s): LIPASE, AMYLASE in the last 168 hours. No results for input(s): AMMONIA in the last 168 hours. Coagulation Profile: No results for input(s): INR, PROTIME in the last 168 hours. Cardiac Enzymes: No results for input(s): CKTOTAL, CKMB, CKMBINDEX, TROPONINI in the last 168 hours. BNP (last 3 results) No results for input(s): PROBNP in the last 8760 hours. HbA1C: No results for input(s): HGBA1C in the last 72 hours. CBG: No results for input(s): GLUCAP in the last  168 hours. Lipid Profile: No results for input(s): CHOL, HDL, LDLCALC, TRIG, CHOLHDL, LDLDIRECT in the last 72 hours. Thyroid Function Tests: No results for input(s): TSH, T4TOTAL, FREET4, T3FREE, THYROIDAB in the last 72 hours. Anemia Panel: No results for input(s): VITAMINB12, FOLATE, FERRITIN, TIBC, IRON, RETICCTPCT in the last 72 hours. Sepsis Labs: No results for input(s): PROCALCITON, LATICACIDVEN in the last 168 hours.  No results found for this or any previous visit (from the past 240 hours).       Radiology Studies: No results found.      Scheduled Meds:  benztropine   1 mg Oral BID   clonazePAM   0.5 mg Oral TID   docusate  100 mg Oral Daily   enoxaparin  (LOVENOX ) injection  40 mg Subcutaneous Daily   feeding supplement  237 mL Oral BID BM   ferrous sulfate   325 mg Oral Q breakfast   folic acid   1 mg Oral Daily   gabapentin   200 mg Oral TID   mirtazapine   15 mg Oral QHS   multivitamin with minerals  1 tablet Oral Daily   nicotine   14 mg Transdermal Daily   pantoprazole   40 mg Oral QHS   polyethylene glycol  17 g Oral Daily   propranolol   20 mg Oral BID   senna  2 tablet Oral Daily   sodium chloride  flush  10-40 mL Intracatheter Q12H   thiamine   100 mg Oral Daily  valbenazine   40 mg Oral Daily   Continuous Infusions:   LOS: 88 days    Time spent: 35 Minutes    Iam Lipson A Doyl Bitting, MD [facility]   If 7PM-7AM, please contact night-coverage [email or web address]  10/11/2024, 1:30 PM   "

## 2024-10-11 NOTE — Plan of Care (Addendum)
 1:1 sitter left bedside @ 04:00 due to illness. Pt awake, sitting in chair with chair alarm activated. Call bell within reach. Telesitter in place.   Problem: Education: Goal: Knowledge of General Education information will improve Description: Including pain rating scale, medication(s)/side effects and non-pharmacologic comfort measures Outcome: Progressing   Problem: Health Behavior/Discharge Planning: Goal: Ability to manage health-related needs will improve Outcome: Progressing   Problem: Clinical Measurements: Goal: Ability to maintain clinical measurements within normal limits will improve Outcome: Progressing Goal: Will remain free from infection Outcome: Progressing Goal: Diagnostic test results will improve Outcome: Progressing Goal: Respiratory complications will improve Outcome: Progressing Goal: Cardiovascular complication will be avoided Outcome: Progressing   Problem: Activity: Goal: Risk for activity intolerance will decrease Outcome: Progressing   Problem: Nutrition: Goal: Adequate nutrition will be maintained Outcome: Progressing   Problem: Coping: Goal: Level of anxiety will decrease Outcome: Progressing   Problem: Elimination: Goal: Will not experience complications related to bowel motility Outcome: Progressing Goal: Will not experience complications related to urinary retention Outcome: Progressing   Problem: Pain Managment: Goal: General experience of comfort will improve and/or be controlled Outcome: Progressing   Problem: Safety: Goal: Ability to remain free from injury will improve Outcome: Progressing   Problem: Skin Integrity: Goal: Risk for impaired skin integrity will decrease Outcome: Progressing   Problem: Education: Goal: Knowledge of the prescribed therapeutic regimen will improve Outcome: Progressing   Problem: Clinical Measurements: Goal: Usual level of consciousness will be regained or maintained. Outcome:  Progressing Goal: Neurologic status will improve Outcome: Progressing Goal: Ability to maintain intracranial pressure will improve Outcome: Progressing   Problem: Skin Integrity: Goal: Demonstration of wound healing without infection will improve Outcome: Progressing

## 2024-10-11 NOTE — Plan of Care (Signed)

## 2024-10-11 NOTE — Final Consult Note (Signed)
 [facility] has reviewed the clinical status of this patient. At this time, no acute neurosurgical intervention is indicated. We will sign off at this time. Should any new neurological changes occur, please re-consult the neurosurgical team.   Thank you.

## 2024-10-12 NOTE — Progress Notes (Signed)
 Psychiatric Nurse Liaison Rounding  Current SI/HI/AVH: Denies   Patient Mood/Affect: Calm  Noted Patient Behaviors: Appropriate behavior towards staff   Time Spent with Patient:  Spent 15 minutes speaking to patient. Pt annoyed that he has been made a ward of the state, but says things will work out. Pt eating dinner and answering questions appropriately. Sitter at bedside.   Loetta Pinal RN, BSN, RN-BC

## 2024-10-12 NOTE — Plan of Care (Signed)

## 2024-10-12 NOTE — Progress Notes (Signed)
 Speech Language Pathology Treatment: Cognitive-Linguistic  Patient Details Name: Ernest Romero MRN: [id number] DOB: 27-Dec-1965 Today's Date: 10/12/2024 Time: 8344-8291 SLP Time Calculation (min) (ACUTE ONLY): 13 min  Assessment / Plan / Recommendation Clinical Impression  Plan: Continue with current PO diet: Dys 3 (mechanical soft) solids, thin liquids, meds whole in puree. SLP will continue to follow for cognition.  Patient seen by SLP for skilled treatment focused on cognitive function goals. New order received for BSE. SLP contacted RN via secure message chat and she indicated that her main concern/question was how he is supposed to take his medications and if it is ok for him to have crackers/graham crackers on Dys 3 (mechanical soft) solids diet. SLP confirmed: meds whole in puree, crackers and graham crackers are ok.  Patient was awake, alert, telling SLP that his mind has been on wanting to get out of the hospital. He is able to recall prior therapy sessions and discussions with others when SLP gives him some context. His overall affect is less flat and he engages in conversation without difficulty or significant delays. Patient indicated he needed size 12 shoes because they've expanded. (He is approximately the same height as SLP and suspect that he could wear size 11 if they were wide.) He denied wanting any more books to read and although he did admit he gets bored, he said I don't get that bored. He has fairly good recall and awareness to safety precautions and was calm, not impulsive and no unsafe behaviors.    HPI HPI: Ernest Romero is a 59 y.o. male who was admitted to [facility]/30/[facility] after an unresponsive episode in jail. Dx large SDH; underwent left frontoparietal craniotomy with evacuation of SDH and drain placement 9/28 (drain removed 9/29). Transferred to [facility] for management of crani/flap 10/14. Cranioplasty with bone flap replacement 10/27; worsening neuro with midline shift;  underwent  left burr hole for evacuation 10/30. Bone flap removal on 11/10.  Bedside swallow eval 10/28 after change in mentation and acute change in swallowing; NPO, then advanced to dysphagia 3/thin liquids 10/31.  New orders 11/18 due to coughing with POs - rec continued Dys3/thin liquids. Repeat orders 11/28 due to acute change again in swallow. MBS completed on 11/29 - severe oropharyngeal dysphagia with aspiration of all liquids and puree solids; rec NPO. Repeat MBS 12/4- no significant change. Cortrak placed. [facility] 12/1 with evolving postoperative changes from left sided hemicraniectomy and bone flap removal without evidence of an acute intracranial abnormality or significant residual subdural collection. MBS 12/8 rec Dys3/thin with L headturn. New BSE ordered 10/12/24; SLP contacted RN who indicated she was mainly concerned with how pills should be given.      SLP Plan  Continue with current plan of care        Swallow Evaluation Recommendations         Recommendations  Diet recommendations: Dysphagia 3 (mechanical soft);Thin liquid Liquids provided via: Cup;Straw Medication Administration: Whole meds with puree Supervision: Intermittent supervision to cue for compensatory strategies;Patient able to self feed Compensations: Slow rate;Small sips/bites Postural Changes and/or Swallow Maneuvers: Seated upright 90 degrees;Upright 30-60 min after meal                      Frequent or constant Supervision/Assistance Cognitive communication deficit (M58.158)     Continue with current plan of care     Norleen IVAR Blase, MA, CCC-SLP Speech Therapy   10/12/2024, 5:33 PM

## 2024-10-12 NOTE — Plan of Care (Signed)
  Problem: Education: Goal: Knowledge of General Education information will improve Description: Including pain rating scale, medication(s)/side effects and non-pharmacologic comfort measures Outcome: Progressing   Problem: Health Behavior/Discharge Planning: Goal: Ability to manage health-related needs will improve Outcome: Progressing   Problem: Clinical Measurements: Goal: Ability to maintain clinical measurements within normal limits will improve Outcome: Progressing Goal: Will remain free from infection Outcome: Progressing Goal: Diagnostic test results will improve Outcome: Progressing Goal: Respiratory complications will improve Outcome: Progressing Goal: Cardiovascular complication will be avoided Outcome: Progressing   Problem: Activity: Goal: Risk for activity intolerance will decrease Outcome: Progressing   Problem: Nutrition: Goal: Adequate nutrition will be maintained Outcome: Progressing   Problem: Coping: Goal: Level of anxiety will decrease Outcome: Progressing   Problem: Elimination: Goal: Will not experience complications related to bowel motility Outcome: Progressing Goal: Will not experience complications related to urinary retention Outcome: Progressing   Problem: Pain Managment: Goal: General experience of comfort will improve and/or be controlled Outcome: Progressing   Problem: Safety: Goal: Ability to remain free from injury will improve Outcome: Progressing   Problem: Skin Integrity: Goal: Risk for impaired skin integrity will decrease Outcome: Progressing   Problem: Education: Goal: Knowledge of the prescribed therapeutic regimen will improve Outcome: Progressing   Problem: Clinical Measurements: Goal: Usual level of consciousness will be regained or maintained. Outcome: Progressing Goal: Neurologic status will improve Outcome: Progressing Goal: Ability to maintain intracranial pressure will improve Outcome: Progressing    Problem: Skin Integrity: Goal: Demonstration of wound healing without infection will improve Outcome: Progressing

## 2024-10-12 NOTE — Progress Notes (Signed)
 " PROGRESS NOTE    Ernest Romero  FMW:[id number] DOB: 1966-05-30 DOA: 07/15/2024 PCP: [name]   Brief Narrative: 59 year old with past medical history significant for alcohol abuse, bipolar disorder, panic disorder presented secondary to being found down, he was found to have a large subdural hematoma, underwent left frontoparietal craniotomy with evacuation of the subdural hematoma and placement of a drain and subsequent removal.  Patient required further management of his craniotomy and flap which necessitated transfer from [place] to [facility] where neurosurgery performed the left side allograft cranioplasty.  Repeat CT head was significant with worsening fluid requiring left frontal burr hole for evacuation on 10/30.  Hospitalization further complicated by development of an infected bone flap with culture grew  MRSA.  Patient underwent left bone flap removal and was treated with IV antibiotics for 6 weeks per infectious disease recommendation he completed 2 weeks of oral vancomycin  to complete a total of 8 weeks of antibiotics on 1/6  He is currently ward of the state now through DSS.  Currently does not have any bed offers, difficult to place.   NO changes in condition.   Assessment & Plan:   Principal Problem:   Subdural hematoma (HCC) Active Problems:   Bipolar disorder (HCC)   Unable to make decisions about medical treatment due to impaired mental capacity   Alcohol withdrawal (HCC)   Essential hypertension   Protein-calorie malnutrition, severe   Acute metabolic encephalopathy   Infection of craniotomy plate  1-Recurrent subdural hematoma: - Patient underwent craniotomy and bur hole procedure per neurosurgery - Per neurosurgery patient could have reaccumulation of fluid.  No new neurological changes. Neurological stable.   MRSA infected bone flap - Infectious disease consulted and patient was treated with IV vancomycin  for 6 weeks completed  12/20-second - He was subsequently started on 2 weeks of doxycycline  which completed 1/6  Acute metabolic encephalopathy - Related to above, mental status is stable, he required intermittent safety sitter  Bipolar disorder: Continue Ingressa, cogentin .   Drug-induced parkinsonism Akathisia Likely related  to use of antipsychotics, EEG obtained and was negative. Continue propranolol , Klonopin , and Ingrezza   Dysphagia: -Patient required NG tube for tube feeding from 12/3 until 12/8 -On  dysphagia 3 diet   HTN: Continue propranolol , amlodipine  and losartan   Sinus bradycardia: complicated by propranolol , dose reduced hold the parameters  Severe malnutrition: Continue with supplement  Iron deficiency anemia: Continue with ferrous sulfate  Aspiration pneumonia: Completed IV Unasyn  Acute hypoxic respiratory failure secondary to pneumonia. Resolved  Alcohol use disorder, alcohol withdrawal Pleated CIWA and phenobarbital  taper. Continue thiamine  folic acid  and multivitamin     Nutrition Problem: Severe Malnutrition Etiology: acute illness    Signs/Symptoms: severe muscle depletion, moderate muscle depletion, percent weight loss Percent weight loss: 15 % (in 3 months)    Interventions: Ensure Enlive (each supplement provides 350kcal and 20 grams of protein), MVI, Magic cup, Refer to RD note for recommendations  Estimated body mass index is 23.47 kg/m as calculated from the following:   Height as of this encounter: 5' 10 (1.778 m).   Weight as of this encounter: 74.2 kg.   DVT prophylaxis: Lovenox  Code Status: Full code Family Communication: Care discussed with patient Disposition Plan:  Status is: Inpatient Remains inpatient appropriate because: Awaiting placement, difficult to place    Consultants:  Psych Neurosurgery Neurology  Procedures:  Left-sided allograft cranioplasty Left frontal burr hole for evacuation of subdural hematoma Left bone flap  removal Cortrak placement/remova  Antimicrobials:  Subjective: Sitting in the bed, does not have any new complaints.  Objective: Vitals:   10/11/24 2106 10/12/24 0500 10/12/24 0623 10/12/24 0713  BP: 120/88  102/79 119/82  Pulse: 68  (!) 52 (!) 51  Resp: 20  17 17   Temp: 97.8 F (36.6 C)  98.4 F (36.9 C) 98.4 F (36.9 C)  TempSrc:   Oral Oral  SpO2: 96%  97% 97%  Weight:  74.2 kg    Height:        Intake/Output Summary (Last 24 hours) at 10/12/2024 0719 Last data filed at 10/11/2024 2200 Gross per 24 hour  Intake 712 ml  Output --  Net 712 ml   Filed Weights   10/08/24 0534 10/10/24 0408 10/12/24 0500  Weight: 72.6 kg 73.4 kg 74.2 kg    Examination:  General exam: NAD Respiratory system: CTA Cardiovascular system: S 1, S 2 RRR Gastrointestinal system: Alert Extremities: No edema   Data Reviewed: I have personally reviewed following labs and imaging studies  CBC: Recent Labs  Lab 10/10/24 0849  WBC 5.1  HGB 14.2  HCT 43.9  MCV 92.6  PLT 204   Basic Metabolic Panel: Recent Labs  Lab 10/10/24 0849  NA 142  K 4.2  CL 107  CO2 27  GLUCOSE 100*  BUN 26*  CREATININE 1.03  CALCIUM 9.4   GFR: Estimated Creatinine Clearance: 78.7 mL/min (by C-G formula based on SCr of 1.03 mg/dL). Liver Function Tests: No results for input(s): AST, ALT, ALKPHOS, BILITOT, PROT, ALBUMIN  in the last 168 hours. No results for input(s): LIPASE, AMYLASE in the last 168 hours. No results for input(s): AMMONIA in the last 168 hours. Coagulation Profile: No results for input(s): INR, PROTIME in the last 168 hours. Cardiac Enzymes: No results for input(s): CKTOTAL, CKMB, CKMBINDEX, TROPONINI in the last 168 hours. BNP (last 3 results) No results for input(s): PROBNP in the last 8760 hours. HbA1C: No results for input(s): HGBA1C in the last 72 hours. CBG: Recent Labs  Lab 10/11/24 1642  GLUCAP 120*   Lipid Profile: No results for  input(s): CHOL, HDL, LDLCALC, TRIG, CHOLHDL, LDLDIRECT in the last 72 hours. Thyroid Function Tests: No results for input(s): TSH, T4TOTAL, FREET4, T3FREE, THYROIDAB in the last 72 hours. Anemia Panel: No results for input(s): VITAMINB12, FOLATE, FERRITIN, TIBC, IRON, RETICCTPCT in the last 72 hours. Sepsis Labs: No results for input(s): PROCALCITON, LATICACIDVEN in the last 168 hours.  No results found for this or any previous visit (from the past 240 hours).       Radiology Studies: No results found.      Scheduled Meds:  benztropine   1 mg Oral BID   clonazePAM   0.5 mg Oral TID   docusate  100 mg Oral Daily   enoxaparin  (LOVENOX ) injection  40 mg Subcutaneous Daily   feeding supplement  237 mL Oral BID BM   ferrous sulfate   325 mg Oral Q breakfast   folic acid   1 mg Oral Daily   gabapentin   200 mg Oral TID   mirtazapine   15 mg Oral QHS   multivitamin with minerals  1 tablet Oral Daily   nicotine   14 mg Transdermal Daily   pantoprazole   40 mg Oral QHS   polyethylene glycol  17 g Oral Daily   propranolol   20 mg Oral BID   senna  2 tablet Oral Daily   sodium chloride  flush  10-40 mL Intracatheter Q12H   thiamine   100 mg Oral Daily   valbenazine   40 mg Oral Daily   Continuous Infusions:   LOS: 89 days    Time spent: 35 Minutes    Zeniya Lapidus A Champagne Paletta, MD [facility]   If 7PM-7AM, please contact night-coverage [email or web address]  10/12/2024, 7:19 AM   "

## 2024-10-13 NOTE — Plan of Care (Signed)
  Problem: Nutrition: Goal: Adequate nutrition will be maintained Outcome: Progressing   Problem: Activity: Goal: Risk for activity intolerance will decrease Outcome: Not Progressing   Problem: Coping: Goal: Level of anxiety will decrease Outcome: Not Progressing

## 2024-10-13 NOTE — Progress Notes (Signed)
 " PROGRESS NOTE    Ernest Romero  FMW:[id number] DOB: 08-Mar-1966 DOA: 07/15/2024 PCP: [name]   Brief Narrative: 59 year old with past medical history significant for alcohol abuse, bipolar disorder, panic disorder presented secondary to being found down, he was found to have a large subdural hematoma, underwent left frontoparietal craniotomy with evacuation of the subdural hematoma and placement of a drain and subsequent removal.  Patient required further management of his craniotomy and flap which necessitated transfer from [place] to [facility] where neurosurgery performed the left side allograft cranioplasty.  Repeat CT head was significant with worsening fluid requiring left frontal burr hole for evacuation on 10/30.  Hospitalization further complicated by development of an infected bone flap with culture grew  MRSA.  Patient underwent left bone flap removal and was treated with IV antibiotics for 6 weeks per infectious disease recommendation he completed 2 weeks of oral vancomycin  to complete a total of 8 weeks of antibiotics on 1/6  He is currently ward of the state now through DSS.  Currently does not have any bed offers, difficult to place.   No changes in medical condition   Assessment & Plan:   Principal Problem:   Subdural hematoma (HCC) Active Problems:   Bipolar disorder (HCC)   Unable to make decisions about medical treatment due to impaired mental capacity   Alcohol withdrawal (HCC)   Essential hypertension   Protein-calorie malnutrition, severe   Acute metabolic encephalopathy   Infection of craniotomy plate  1-Recurrent subdural hematoma: - Patient underwent craniotomy and bur hole procedure per neurosurgery - Per neurosurgery patient could have reaccumulation of fluid.  No new neurological changes. Neurological stable.   MRSA infected bone flap - Infectious disease consulted and patient was treated with IV vancomycin  for 6 weeks  completed 12/20-second - He was subsequently started on 2 weeks of doxycycline  which completed 1/6  Acute metabolic encephalopathy - Related to above, mental status is stable, he required intermittent safety sitter  Bipolar disorder: Continue Ingressa, cogentin .   Drug-induced parkinsonism Akathisia Likely related  to use of antipsychotics, EEG obtained and was negative. Continue propranolol , Klonopin , and Ingrezza   Dysphagia: -Patient required NG tube for tube feeding from 12/3 until 12/8 -On  dysphagia 3 diet   HTN: Continue propranolol , amlodipine  and losartan   Sinus bradycardia: Complicated by propranolol , dose reduced hold the parameters  Severe malnutrition: Continue with supplement  Iron deficiency anemia: Continue with ferrous sulfate  Aspiration pneumonia: Completed IV Unasyn  Acute hypoxic respiratory failure secondary to pneumonia. Resolved  Alcohol use disorder, alcohol withdrawal Pleated CIWA and phenobarbital  taper. Continue thiamine  folic acid  and multivitamin     Nutrition Problem: Severe Malnutrition Etiology: acute illness    Signs/Symptoms: severe muscle depletion, moderate muscle depletion, percent weight loss Percent weight loss: 15 % (in 3 months)    Interventions: Ensure Enlive (each supplement provides 350kcal and 20 grams of protein), MVI, Magic cup, Refer to RD note for recommendations  Estimated body mass index is 23.82 kg/m as calculated from the following:   Height as of this encounter: 5' 10 (1.778 m).   Weight as of this encounter: 75.3 kg.   DVT prophylaxis: Lovenox  Code Status: Full code Family Communication: Care discussed with patient Disposition Plan:  Status is: Inpatient Remains inpatient appropriate because: Awaiting placement, difficult to place    Consultants:  Psych Neurosurgery Neurology  Procedures:  Left-sided allograft cranioplasty Left frontal burr hole for evacuation of subdural hematoma Left bone  flap removal Cortrak placement/remova  Antimicrobials:    Subjective: He is sleeping today, answer questions.   Objective: Vitals:   10/12/24 1610 10/12/24 2006 10/13/24 0500 10/13/24 0618  BP: 108/79 109/85  120/88  Pulse: 61 65  (!) 53  Resp: 18 18    Temp: 98 F (36.7 C) 97.9 F (36.6 C)  (!) 97 F (36.1 C)  TempSrc: Oral Oral    SpO2: 96% 96%  96%  Weight:   75.3 kg   Height:        Intake/Output Summary (Last 24 hours) at 10/13/2024 0740 Last data filed at 10/12/2024 1123 Gross per 24 hour  Intake 480 ml  Output --  Net 480 ml   Filed Weights   10/10/24 0408 10/12/24 0500 10/13/24 0500  Weight: 73.4 kg 74.2 kg 75.3 kg    Examination:  General exam: NAD Respiratory system: CTA Cardiovascular system: S 1, S 2 RRR Gastrointestinal system: Alert.  Extremities: No edema   Data Reviewed: I have personally reviewed following labs and imaging studies  CBC: Recent Labs  Lab 10/10/24 0849  WBC 5.1  HGB 14.2  HCT 43.9  MCV 92.6  PLT 204   Basic Metabolic Panel: Recent Labs  Lab 10/10/24 0849  NA 142  K 4.2  CL 107  CO2 27  GLUCOSE 100*  BUN 26*  CREATININE 1.03  CALCIUM 9.4   GFR: Estimated Creatinine Clearance: 78.7 mL/min (by C-G formula based on SCr of 1.03 mg/dL). Liver Function Tests: No results for input(s): AST, ALT, ALKPHOS, BILITOT, PROT, ALBUMIN  in the last 168 hours. No results for input(s): LIPASE, AMYLASE in the last 168 hours. No results for input(s): AMMONIA in the last 168 hours. Coagulation Profile: No results for input(s): INR, PROTIME in the last 168 hours. Cardiac Enzymes: No results for input(s): CKTOTAL, CKMB, CKMBINDEX, TROPONINI in the last 168 hours. BNP (last 3 results) No results for input(s): PROBNP in the last 8760 hours. HbA1C: No results for input(s): HGBA1C in the last 72 hours. CBG: Recent Labs  Lab 10/11/24 1642  GLUCAP 120*   Lipid Profile: No results for input(s):  CHOL, HDL, LDLCALC, TRIG, CHOLHDL, LDLDIRECT in the last 72 hours. Thyroid Function Tests: No results for input(s): TSH, T4TOTAL, FREET4, T3FREE, THYROIDAB in the last 72 hours. Anemia Panel: No results for input(s): VITAMINB12, FOLATE, FERRITIN, TIBC, IRON, RETICCTPCT in the last 72 hours. Sepsis Labs: No results for input(s): PROCALCITON, LATICACIDVEN in the last 168 hours.  No results found for this or any previous visit (from the past 240 hours).       Radiology Studies: No results found.      Scheduled Meds:  benztropine   1 mg Oral BID   clonazePAM   0.5 mg Oral TID   docusate  100 mg Oral Daily   enoxaparin  (LOVENOX ) injection  40 mg Subcutaneous Daily   feeding supplement  237 mL Oral BID BM   ferrous sulfate   325 mg Oral Q breakfast   folic acid   1 mg Oral Daily   gabapentin   200 mg Oral TID   mirtazapine   15 mg Oral QHS   multivitamin with minerals  1 tablet Oral Daily   nicotine   14 mg Transdermal Daily   pantoprazole   40 mg Oral QHS   polyethylene glycol  17 g Oral Daily   propranolol   20 mg Oral BID   senna  2 tablet Oral Daily   sodium chloride  flush  10-40 mL Intracatheter Q12H   thiamine   100 mg Oral Daily  valbenazine   40 mg Oral Daily   Continuous Infusions:   LOS: 90 days    Time spent: 35 Minutes    Keymiah Lyles A Joyanne Eddinger, MD [facility]   If 7PM-7AM, please contact night-coverage [email or web address]  10/13/2024, 7:40 AM   "

## 2024-10-13 NOTE — Plan of Care (Signed)
" °  Problem: Nutrition: Goal: Adequate nutrition will be maintained Outcome: Progressing   Problem: Activity: Goal: Risk for activity intolerance will decrease Outcome: Progressing   Problem: Coping: Goal: Level of anxiety will decrease Outcome: Progressing   Problem: Elimination: Goal: Will not experience complications related to bowel motility Outcome: Progressing Goal: Will not experience complications related to urinary retention Outcome: Progressing   Problem: Safety: Goal: Ability to remain free from injury will improve Outcome: Progressing   "

## 2024-10-14 NOTE — Progress Notes (Signed)
 Physical Therapy Treatment Patient Details Name: Ernest Romero MRN: [id number] DOB: 1965/11/09 Today's Date: 10/14/2024   History of Present Illness 59 y.o. male presenting 07/16/24 from [facility] where he was [facility]. 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 On CIWA protocol. Transferred to [facility] for further management of craniotomy and flap. 10/27 s/p cranioplasty with bone flap replacement. 10/28 Change in status thought to be related to Klonopin  administration 10/28, repeat CTH shows significant increase in size of mixed attenuation subdural fluid collection underlying the L tempoparietal craniotomy site, now measuring approximately 15 mm in thickness, with associated mild mass effect and slight rightward midline shift. OR on 10/30 for SDH evacuation. [facility] 11/7 with fluid collected under bone flap. 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 Worsening dysphagia 11/29; repeat CTH stable 12/1. Cortrak placed 12/3-12/8. PMH includes alcohol use disorder, psychiatric disorder, HTN.    PT Comments  Patient progressing with balance, mobility and strength this session.  Noted met 1 of 3 goals, new goals set and updated for balance in addition to gait/stairs.  PT will continue to follow during the acute stay.  Remain hopeful for rehab.   If plan is discharge home, recommend the following: Assistance with cooking/housework;Direct supervision/assist for medications management;Direct supervision/assist for financial management;Assist for transportation;Help with stairs or ramp for entrance;Supervision due to cognitive status;A little help with walking and/or transfers;A little help with bathing/dressing/bathroom   Can travel by private vehicle     Yes  Equipment Recommendations  None recommended by PT    Recommendations for Other Services       Precautions /  Restrictions Precautions Precautions: Fall Recall of Precautions/Restrictions: Impaired Precaution/Restrictions Comments: SBP <160, helmet (pt to wear when OOB)     Mobility  Bed Mobility               General bed mobility comments: on EOB initially and at end of session    Transfers   Equipment used: None Transfers: Sit to/from Stand Sit to Stand: Supervision           General transfer comment: donns helmet without cues, had gotten up with RN just prior to bathroom    Ambulation/Gait Ambulation/Gait assistance: Supervision Gait Distance (Feet): 200 Feet Assistive device: None Gait Pattern/deviations: Step-through pattern, Decreased stride length       General Gait Details: no shuffling, but still with decreased foot clearance, still with mild posterior bias, though no LOB   Stairs Stairs: Yes Stairs assistance: Min assist Stair Management: One rail Right, Alternating pattern, Forwards Number of Stairs: 10 General stair comments: assist for safety, cues for rail use and foot clearance, min A for safety; to descend HHA and rail with cues for hand further down the rail   Wheelchair Mobility     Tilt Bed    Modified Rankin (Stroke Patients Only)       Balance     Sitting balance-Leahy Scale: Good     Standing balance support: No upper extremity supported Standing balance-Leahy Scale: Good                              Communication Communication Communication: No apparent difficulties  Cognition Arousal: Alert Behavior During Therapy: WFL for tasks assessed/performed   PT - Cognitive impairments: Memory, Safety/Judgement, Attention  Rancho Levels of Cognitive Functioning Rancho Los Amigos Scales of Cognitive Functioning: Automatic, Appropriate: Minimal Assistance for Daily Living Skills Rancho Los Amigos Scales of Cognitive Functioning: Automatic, Appropriate: Minimal Assistance for Daily Living Skills  [VII] PT - Cognition Comments: stating seems like I should be in rehab by now once discussed having difficulty with getting a place for him, he stated, it's because of my insurance; assist and cues for safety on stairs not attentive for foot clearance, and rail use independently   Following commands impaired: Follows multi-step commands with increased time    Cueing Cueing Techniques: Verbal cues, Visual cues  Exercises General Exercises - Lower Extremity Hip ABduction/ADduction: Strengthening, Both, 10 reps, Standing Heel Raises: Strengthening, Both, 10 reps, Standing Mini-Sqauts: Strengthening, 10 reps, Standing Other Exercises Other Exercises: quadruped on bed alternating hip extension with CGA    General Comments        Pertinent Vitals/Pain Pain Assessment Pain Assessment: No/denies pain    Home Living                          Prior Function            PT Goals (current goals can now be found in the care plan section) Acute Rehab PT Goals Patient Stated Goal: rehab PT Goal Formulation: With patient Time For Goal Achievement: 10/28/24 Potential to Achieve Goals: Fair Progress towards PT goals: Goals met and updated - see care plan    Frequency    Min 2X/week      PT Plan      Co-evaluation              AM-PAC PT 6 Clicks Mobility   Outcome Measure  Help needed turning from your back to your side while in a flat bed without using bedrails?: None Help needed moving from lying on your back to sitting on the side of a flat bed without using bedrails?: None Help needed moving to and from a bed to a chair (including a wheelchair)?: A Little Help needed standing up from a chair using your arms (e.g., wheelchair or bedside chair)?: None Help needed to walk in hospital room?: A Little Help needed climbing 3-5 steps with a railing? : A Little 6 Click Score: 21    End of Session   Activity Tolerance: Patient tolerated treatment  well Patient left: in bed (seated on EOB with lunch set up)   PT Visit Diagnosis: Other abnormalities of gait and mobility (R26.89);Other symptoms and signs involving the nervous system (R29.898)     Time: 8759-8744 PT Time Calculation (min) (ACUTE ONLY): 15 min  Charges:    $Therapeutic Activity: 8-22 mins PT General Charges $$ ACUTE PT VISIT: 1 Visit                     Micheline Portal, PT Acute Rehabilitation Services Office:[phone] 10/14/2024    Montie Portal 10/14/2024, 2:23 PM

## 2024-10-14 NOTE — Plan of Care (Signed)
  Problem: Education: Goal: Knowledge of General Education information will improve Description: Including pain rating scale, medication(s)/side effects and non-pharmacologic comfort measures Outcome: Progressing   Problem: Health Behavior/Discharge Planning: Goal: Ability to manage health-related needs will improve Outcome: Progressing   Problem: Clinical Measurements: Goal: Ability to maintain clinical measurements within normal limits will improve Outcome: Progressing Goal: Will remain free from infection Outcome: Progressing Goal: Diagnostic test results will improve Outcome: Progressing Goal: Respiratory complications will improve Outcome: Progressing Goal: Cardiovascular complication will be avoided Outcome: Progressing   Problem: Activity: Goal: Risk for activity intolerance will decrease Outcome: Progressing   Problem: Nutrition: Goal: Adequate nutrition will be maintained Outcome: Progressing   Problem: Coping: Goal: Level of anxiety will decrease Outcome: Progressing   Problem: Elimination: Goal: Will not experience complications related to bowel motility Outcome: Progressing Goal: Will not experience complications related to urinary retention Outcome: Progressing   Problem: Pain Managment: Goal: General experience of comfort will improve and/or be controlled Outcome: Progressing   Problem: Safety: Goal: Ability to remain free from injury will improve Outcome: Progressing   Problem: Skin Integrity: Goal: Risk for impaired skin integrity will decrease Outcome: Progressing   Problem: Education: Goal: Knowledge of the prescribed therapeutic regimen will improve Outcome: Progressing   Problem: Clinical Measurements: Goal: Usual level of consciousness will be regained or maintained. Outcome: Progressing Goal: Neurologic status will improve Outcome: Progressing Goal: Ability to maintain intracranial pressure will improve Outcome: Progressing    Problem: Skin Integrity: Goal: Demonstration of wound healing without infection will improve Outcome: Progressing

## 2024-10-14 NOTE — Progress Notes (Signed)
 " PROGRESS NOTE    Ernest Romero  FMW:[id number] DOB: May 06, 1966 DOA: 07/15/2024 P[facility]: [name]   Brief Narrative: 59 year old with past medical history significant for alcohol abuse, bipolar disorder, panic disorder presented secondary to being found down, he was found to have a large subdural hematoma, underwent left frontoparietal craniotomy with evacuation of the subdural hematoma and placement of a drain and subsequent removal.  Patient required further management of his craniotomy and flap which necessitated transfer from [place] to [facility] where neurosurgery performed the left side allograft cranioplasty.  Repeat CT head was significant with worsening fluid requiring left frontal burr hole for evacuation on 10/30.  Hospitalization further complicated by development of an infected bone flap with culture grew  MRSA.  Patient underwent left bone flap removal and was treated with IV antibiotics for 6 weeks per infectious disease recommendation he completed 2 weeks of oral vancomycin  to complete a total of 8 weeks of antibiotics on 1/6  He is currently ward of the state now through DSS.  Currently does not have any bed offers, difficult to place.   No changes in medical condition. Labs stable 1/08  Assessment & Plan:   Principal Problem:   Subdural hematoma (HCC) Active Problems:   Bipolar disorder (HCC)   Unable to make decisions about medical treatment due to impaired mental capacity   Alcohol withdrawal (HCC)   Essential hypertension   Protein-calorie malnutrition, severe   Acute metabolic encephalopathy   Infection of craniotomy plate  1-Recurrent subdural hematoma: - Patient underwent craniotomy and bur hole procedure per neurosurgery - Per neurosurgery patient could have reaccumulation of fluid.  No new neurological changes. -Neurological stable.   MRSA infected bone flap - Infectious disease consulted and patient was treated with IV vancomycin   for 6 weeks completed 12/20-second - He was subsequently started on 2 weeks of doxycycline  which completed 1/6  Acute metabolic encephalopathy - Related to above, mental status is stable, he required intermittent safety sitter  Bipolar disorder: Continue Ingressa, cogentin .   Drug-induced parkinsonism Akathisia Likely related  to use of antipsychotics, EEG obtained and was negative. Continue propranolol , Klonopin , and Ingrezza   Dysphagia: -Patient required NG tube for tube feeding from 12/3 until 12/8 -On  dysphagia 3 diet   HTN: Continue propranolol , amlodipine  and losartan   Sinus bradycardia: Complicated by propranolol , dose reduced hold the parameters  Severe malnutrition: Continue with supplement  Iron deficiency anemia: Continue with ferrous sulfate  Aspiration pneumonia: Completed IV Unasyn  Acute hypoxic respiratory failure secondary to pneumonia. Resolved  Alcohol use disorder, alcohol withdrawal Pleated CIWA and phenobarbital  taper. Continue thiamine  folic acid  and multivitamin     Nutrition Problem: Severe Malnutrition Etiology: acute illness    Signs/Symptoms: severe muscle depletion, moderate muscle depletion, percent weight loss Percent weight loss: 15 % (in 3 months)    Interventions: Ensure Enlive (each supplement provides 350kcal and 20 grams of protein), MVI, Magic cup, Refer to RD note for recommendations  Estimated body mass index is 23.82 kg/m as calculated from the following:   Height as of this encounter: 5' 10 (1.778 m).   Weight as of this encounter: 75.3 kg.   DVT prophylaxis: Lovenox  Code Status: Full code Family Communication: Care discussed with patient Disposition Plan:  Status is: Inpatient Remains inpatient appropriate because: Awaiting placement, difficult to place    Consultants:  Psych Neurosurgery Neurology  Procedures:  Left-sided allograft cranioplasty Left frontal burr hole for evacuation of subdural  hematoma Left bone flap removal Cortrak  placement/remova  Antimicrobials:    Subjective: Sitting in bed, no distress. Mild jaw tremors.  No new complaints.   Objective: Vitals:   10/12/24 2006 10/13/24 0500 10/13/24 0618 10/13/24 1136  BP: 109/85  120/88 117/76  Pulse: 65  (!) 53 61  Resp: 18     Temp: 97.9 F (36.6 C)  (!) 97 F (36.1 C)   TempSrc: Oral     SpO2: 96%  96% 99%  Weight:  75.3 kg    Height:        Intake/Output Summary (Last 24 hours) at 10/14/2024 0733 Last data filed at 10/13/2024 0900 Gross per 24 hour  Intake 240 ml  Output --  Net 240 ml   Filed Weights   10/10/24 0408 10/12/24 0500 10/13/24 0500  Weight: 73.4 kg 74.2 kg 75.3 kg    Examination:  General exam: NAD Respiratory system: CTA Cardiovascular system: SS 1, S 2 RRR Gastrointestinal system: Alert Extremities:  no edema   Data Reviewed: I have personally reviewed following labs and imaging studies  CBC: Recent Labs  Lab 10/10/24 0849  WBC 5.1  HGB 14.2  HCT 43.9  MCV 92.6  PLT 204   Basic Metabolic Panel: Recent Labs  Lab 10/10/24 0849  NA 142  K 4.2  CL 107  CO2 27  GLUCOSE 100*  BUN 26*  CREATININE 1.03  CALCIUM 9.4   GFR: Estimated Creatinine Clearance: 78.7 mL/min (by C-G formula based on SCr of 1.03 mg/dL). Liver Function Tests: No results for input(s): AST, ALT, ALKPHOS, BILITOT, PROT, ALBUMIN  in the last 168 hours. No results for input(s): LIPASE, AMYLASE in the last 168 hours. No results for input(s): AMMONIA in the last 168 hours. Coagulation Profile: No results for input(s): INR, PROTIME in the last 168 hours. Cardiac Enzymes: No results for input(s): CKTOTAL, CKMB, CKMBINDEX, TROPONINI in the last 168 hours. BNP (last 3 results) No results for input(s): PROBNP in the last 8760 hours. HbA1C: No results for input(s): HGBA1C in the last 72 hours. CBG: Recent Labs  Lab 10/11/24 1642  GLUCAP 120*   Lipid  Profile: No results for input(s): CHOL, HDL, LDLCALC, TRIG, CHOLHDL, LDLDIRECT in the last 72 hours. Thyroid Function Tests: No results for input(s): TSH, T4TOTAL, FREET4, T3FREE, THYROIDAB in the last 72 hours. Anemia Panel: No results for input(s): VITAMINB12, FOLATE, FERRITIN, TIBC, IRON, RETICCTPCT in the last 72 hours. Sepsis Labs: No results for input(s): PROCALCITON, LATICACIDVEN in the last 168 hours.  No results found for this or any previous visit (from the past 240 hours).       Radiology Studies: No results found.      Scheduled Meds:  benztropine   1 mg Oral BID   clonazePAM   0.5 mg Oral TID   docusate  100 mg Oral Daily   enoxaparin  (LOVENOX ) injection  40 mg Subcutaneous Daily   feeding supplement  237 mL Oral BID BM   ferrous sulfate   325 mg Oral Q breakfast   folic acid   1 mg Oral Daily   gabapentin   200 mg Oral TID   mirtazapine   15 mg Oral QHS   multivitamin with minerals  1 tablet Oral Daily   nicotine   14 mg Transdermal Daily   pantoprazole   40 mg Oral QHS   polyethylene glycol  17 g Oral Daily   propranolol   20 mg Oral BID   senna  2 tablet Oral Daily   sodium chloride  flush  10-40 mL Intracatheter Q12H   thiamine   100 mg Oral Daily   valbenazine   40 mg Oral Daily   Continuous Infusions:   LOS: 91 days    Time spent: 35 Minutes    Ernest Romero A Trixie Maclaren, MD [facility]   If 7PM-7AM, please contact night-coverage [email or web address]  10/14/2024, 7:33 AM   "

## 2024-10-15 NOTE — TOC Progression Note (Addendum)
 Transition of Care [facility]) - Progression Note    Patient Details  Name: Ernest Romero MRN: [id number] Date of Birth: 10/27/1965  [facility] CM/SW Contact  [name], [place] Phone Number: 10/15/2024, 4:24 PM  Clinical Narrative:     CSW spoke with Zelvia with [facility] regarding placement for patient. Zelvia requested CSW complete an FL2 for group home/family care home placement to look at placement aside from SNF. CSW filled out new FL2 and will email copy to patient's APS social worker, Nat Chang. CSW will continue to follow and update dc plan.   Expected Discharge Plan: Skilled Nursing Facility Barriers to Discharge: Continued Medical Work up, English As A Second Language Teacher, SNF Pending bed offer               Expected Discharge Plan and Services In-house Referral: Clinical Social Work Discharge Planning Services: CM Consult                                           Social Drivers of Health (SDOH) Interventions SDOH Screenings   Food Insecurity: Patient Unable To Answer (07/16/2024)  Housing: Unknown (07/16/2024)  Transportation Needs: Patient Unable To Answer (07/16/2024)  Utilities: Patient Unable To Answer (07/16/2024)  Alcohol Screen: Low Risk (08/30/2022)  Depression (PHQ2-9): Medium Risk (08/30/2022)  Tobacco Use: Medium Risk (08/12/2024)    Readmission Risk Interventions    07/16/2024   12:23 PM  Readmission Risk Prevention Plan  Transportation Screening Complete  Medication Review (RN Care Manager) Complete  PCP or Specialist appointment within 3-5 days of discharge Complete  HRI or Home Care Consult Complete  SW Recovery Care/Counseling Consult Complete  Palliative Care Screening Not Applicable  Skilled Nursing Facility Not Applicable

## 2024-10-15 NOTE — Plan of Care (Signed)
  Problem: Education: Goal: Knowledge of General Education information will improve Description: Including pain rating scale, medication(s)/side effects and non-pharmacologic comfort measures Outcome: Progressing   Problem: Health Behavior/Discharge Planning: Goal: Ability to manage health-related needs will improve Outcome: Progressing   Problem: Clinical Measurements: Goal: Ability to maintain clinical measurements within normal limits will improve Outcome: Progressing Goal: Will remain free from infection Outcome: Progressing Goal: Diagnostic test results will improve Outcome: Progressing Goal: Respiratory complications will improve Outcome: Progressing Goal: Cardiovascular complication will be avoided Outcome: Progressing   Problem: Activity: Goal: Risk for activity intolerance will decrease Outcome: Progressing   Problem: Nutrition: Goal: Adequate nutrition will be maintained Outcome: Progressing   Problem: Coping: Goal: Level of anxiety will decrease Outcome: Progressing   Problem: Elimination: Goal: Will not experience complications related to bowel motility Outcome: Progressing Goal: Will not experience complications related to urinary retention Outcome: Progressing   Problem: Pain Managment: Goal: General experience of comfort will improve and/or be controlled Outcome: Progressing   Problem: Safety: Goal: Ability to remain free from injury will improve Outcome: Progressing   Problem: Skin Integrity: Goal: Risk for impaired skin integrity will decrease Outcome: Progressing   Problem: Education: Goal: Knowledge of the prescribed therapeutic regimen will improve Outcome: Progressing   Problem: Clinical Measurements: Goal: Usual level of consciousness will be regained or maintained. Outcome: Progressing Goal: Neurologic status will improve Outcome: Progressing Goal: Ability to maintain intracranial pressure will improve Outcome: Progressing    Problem: Skin Integrity: Goal: Demonstration of wound healing without infection will improve Outcome: Progressing

## 2024-10-15 NOTE — Progress Notes (Signed)
 Occupational Therapy Treatment Patient Details Name: Ernest Romero MRN: [id number] DOB: November 30, 1965 Today's Date: 10/15/2024   History of present illness 59 y.o. male presenting 07/16/24 from [facility] where he was admitted 06/30/24 for [facility] episode in the jail. 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 On CIWA protocol. Transferred to [facility] for further management of craniotomy and flap. 10/27 s/p cranioplasty with bone flap replacement. 10/28 Change in status thought to be related to Klonopin  administration 10/28, repeat CTH shows significant increase in size of mixed attenuation subdural fluid collection underlying the L tempoparietal craniotomy site, now measuring approximately 15 mm in thickness, with associated mild mass effect and slight rightward midline shift. OR on 10/30 for SDH evacuation. [facility] 11/7 with fluid collected under bone flap. 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 Worsening dysphagia 11/29; repeat CTH stable 12/1. Cortrak placed 12/3-12/8. PMH includes alcohol use disorder, psychiatric disorder, HTN.   OT comments  Patient received in supine and agreeable to OT session. Patient able to get to EOB with supervision. Patient required cues to use poster for list of daily tasks before getting OOB. Patient able to stand at sink for grooming and performed mobility in hallway with supervision and was able to locate room. Patient was provided HEP for jaw exercises and patient was able to perform. Discharge recommendations continue to be appropriate. Acute OT to continue to follow to address established goals to facilitate DC to next venue of care.        If plan is discharge home, recommend the following:  Supervision due to cognitive status;Direct supervision/assist for medications management;Assistance with cooking/housework;Direct supervision/assist for financial management;Assist for  transportation;Assistance with feeding;A little help with walking and/or transfers;Help with stairs or ramp for entrance;A little help with bathing/dressing/bathroom   Equipment Recommendations  None recommended by OT    Recommendations for Other Services      Precautions / Restrictions Precautions Precautions: Fall Recall of Precautions/Restrictions: Impaired Precaution/Restrictions Comments: SBP <160, helmet (pt to wear when OOB) Restrictions Weight Bearing Restrictions Per Provider Order: No       Mobility Bed Mobility Overal bed mobility: Needs Assistance Bed Mobility: Supine to Sit     Supine to sit: Supervision     General bed mobility comments: supervision and cues to initiate    Transfers Overall transfer level: Needs assistance Equipment used: None Transfers: Sit to/from Stand Sit to Stand: Supervision           General transfer comment: required cues to donn helmet before getting up     Balance Overall balance assessment: Mild deficits observed, not formally tested   Sitting balance-Leahy Scale: Good     Standing balance support: No upper extremity supported Standing balance-Leahy Scale: Good                             ADL either performed or assessed with clinical judgement   ADL Overall ADL's : Needs assistance/impaired     Grooming: Supervision/safety;Standing;Cueing for safety               Lower Body Dressing: Supervision/safety;Sit to/from stand;Cueing for safety                      Extremity/Trunk Assessment              Vision       Perception  Praxis     Communication Communication Communication: No apparent difficulties   Cognition Arousal: Alert Behavior During Therapy: WFL for tasks assessed/performed Cognition: Cognition impaired     Awareness: Intellectual awareness intact, Online awareness impaired Memory impairment (select all impairments): Working civil service fast streamer, Short-term  memory Attention impairment (select first level of impairment): Divided attention Executive functioning impairment (select all impairments): Organization, Sequencing, Reasoning, Problem solving OT - Cognition Comments: required cues to use poster to perform daily tasks               [employer] of Cognitive Functioning: Automatic, Appropriate: Minimal Assistance for Daily Living Skills [VII] Following commands: Impaired Following commands impaired: Follows multi-step commands with increased time      Cueing   Cueing Techniques: Verbal cues, Visual cues  Exercises Exercises: Other exercises Other Exercises Other Exercises: performed jaw exercises following HEP    Shoulder Instructions       General Comments able to perform mobility in hallway and navigate back to room    Pertinent Vitals/ Pain       Pain Assessment Pain Assessment: No/denies pain  Home Living                                          Prior Functioning/Environment              Frequency  Min 2X/week        Progress Toward Goals  OT Goals(current goals can now be found in the care plan section)  Progress towards OT goals: Progressing toward goals     Plan      Co-evaluation                 AM-PAC OT 6 Clicks Daily Activity     Outcome Measure   Help from another person eating meals?: None Help from another person taking care of personal grooming?: A Little Help from another person toileting, which includes using toliet, bedpan, or urinal?: A Little Help from another person bathing (including washing, rinsing, drying)?: A Little Help from another person to put on and taking off regular upper body clothing?: A Little Help from another person to put on and taking off regular lower body clothing?: A Little 6 Click Score: 19    End of Session Equipment Utilized During Treatment: Gait belt;Other (comment) (helmet)  OT Visit Diagnosis: Other  abnormalities of gait and mobility (R26.89);Muscle weakness (generalized) (M62.81);Other symptoms and signs involving the nervous system (R29.898);Cognitive communication deficit (R41.841);Other symptoms and signs involving cognitive function   Activity Tolerance Patient tolerated treatment well   Patient Left in chair;with call bell/phone within reach;with chair alarm set   Nurse Communication Mobility status        Time: 9261-9241 OT Time Calculation (min): 20 min  Charges: OT General Charges $OT Visit: 1 Visit OT Treatments $Self Care/Home Management : 8-22 mins  Ernest Romero, OTA Acute Rehabilitation Services  Office [phone]   Ernest Romero 10/15/2024, 9:23 AM

## 2024-10-15 NOTE — Progress Notes (Signed)
 " PROGRESS NOTE    Ernest Romero  FMW:[id number] DOB: 1966/09/26 DOA: 07/15/2024 PCP: [name]   Brief Narrative: 59 year old with past medical history significant for alcohol abuse, bipolar disorder, panic disorder presented secondary to being found down, he was found to have a large subdural hematoma, underwent left frontoparietal craniotomy with evacuation of the subdural hematoma and placement of a drain and subsequent removal.  Patient required further management of his craniotomy and flap which necessitated transfer from [facility] to [facility] where neurosurgery performed the left side allograft cranioplasty.  Repeat CT head was significant with worsening fluid requiring left frontal burr hole for evacuation on 10/30.  Hospitalization further complicated by development of an infected bone flap with culture grew  MRSA.  Patient underwent left bone flap removal and was treated with IV antibiotics for 6 weeks per infectious disease recommendation he completed 2 weeks of oral vancomycin  to complete a total of 8 weeks of antibiotics on 1/6  He is currently ward of the state now through DSS.  Currently does not have any bed offers, difficult to place.   No changes in medical condition. Labs stable 1/08  Assessment & Plan:   Principal Problem:   Subdural hematoma (HCC) Active Problems:   Bipolar disorder (HCC)   Unable to make decisions about medical treatment due to impaired mental capacity   Alcohol withdrawal (HCC)   Essential hypertension   Protein-calorie malnutrition, severe   Acute metabolic encephalopathy   Infection of craniotomy plate  1-Recurrent subdural hematoma: - Patient underwent craniotomy and bur hole procedure per neurosurgery - Per neurosurgery patient could have reaccumulation of fluid.  No new neurological changes. -Neurological stable.   MRSA infected bone flap - Infectious disease consulted and patient was treated with IV vancomycin   for 6 weeks completed 12/20-second - He was subsequently started on 2 weeks of doxycycline  which completed 1/6  Acute metabolic encephalopathy - Related to above, mental status is stable, he required intermittent safety sitter  Bipolar disorder: Continue Ingressa, cogentin .   Drug-induced parkinsonism Akathisia Likely related  to use of antipsychotics, EEG obtained and was negative. Continue propranolol , Klonopin , and Ingrezza   Dysphagia: -Patient required NG tube for tube feeding from 12/3 until 12/8 -On  dysphagia 3 diet   HTN: Continue propranolol , amlodipine  and losartan   Sinus bradycardia: Complicated by propranolol , dose reduced hold the parameters  Severe malnutrition: Continue with supplement  Iron deficiency anemia: Continue with ferrous sulfate  Aspiration pneumonia: Completed IV Unasyn  Acute hypoxic respiratory failure secondary to pneumonia. Resolved  Alcohol use disorder, alcohol withdrawal Pleated CIWA and phenobarbital  taper. Continue thiamine  folic acid  and multivitamin     Nutrition Problem: Severe Malnutrition Etiology: acute illness    Signs/Symptoms: severe muscle depletion, moderate muscle depletion, percent weight loss Percent weight loss: 15 % (in 3 months)    Interventions: Ensure Enlive (each supplement provides 350kcal and 20 grams of protein), MVI, Magic cup, Refer to RD note for recommendations  Estimated body mass index is 23.16 kg/m as calculated from the following:   Height as of this encounter: 5' 10 (1.778 m).   Weight as of this encounter: 73.2 kg.   DVT prophylaxis: Lovenox  Code Status: Full code Family Communication: Care discussed with patient Disposition Plan:  Status is: Inpatient Remains inpatient appropriate because: Awaiting placement, difficult to place    Consultants:  Psych Neurosurgery Neurology  Procedures:  Left-sided allograft cranioplasty Left frontal burr hole for evacuation of subdural  hematoma Left bone flap removal Cortrak  placement/remova  Antimicrobials:    Subjective: No new complaints.   Objective: Vitals:   10/14/24 2033 10/15/24 0346 10/15/24 0448 10/15/24 0815  BP: 103/70 106/72  101/77  Pulse: (!) 58   (!) 57  Resp: 18 18  20   Temp: 98.6 F (37 C) 98.4 F (36.9 C)  (!) 97.4 F (36.3 C)  TempSrc: Oral Oral    SpO2: 96% 95%  97%  Weight:   73.2 kg   Height:        Intake/Output Summary (Last 24 hours) at 10/15/2024 1259 Last data filed at 10/15/2024 0700 Gross per 24 hour  Intake 418 ml  Output --  Net 418 ml   Filed Weights   10/12/24 0500 10/13/24 0500 10/15/24 0448  Weight: 74.2 kg 75.3 kg 73.2 kg    Examination:  General exam: NAD Respiratory system: CTA Cardiovascular system: S1, S 2 RRR Gastrointestinal system: Alert Extremities:  No edema   Data Reviewed: I have personally reviewed following labs and imaging studies  CBC: Recent Labs  Lab 10/10/24 0849  WBC 5.1  HGB 14.2  HCT 43.9  MCV 92.6  PLT 204   Basic Metabolic Panel: Recent Labs  Lab 10/10/24 0849  NA 142  K 4.2  CL 107  CO2 27  GLUCOSE 100*  BUN 26*  CREATININE 1.03  CALCIUM 9.4   GFR: Estimated Creatinine Clearance: 78.7 mL/min (by C-G formula based on SCr of 1.03 mg/dL). Liver Function Tests: No results for input(s): AST, ALT, ALKPHOS, BILITOT, PROT, ALBUMIN  in the last 168 hours. No results for input(s): LIPASE, AMYLASE in the last 168 hours. No results for input(s): AMMONIA in the last 168 hours. Coagulation Profile: No results for input(s): INR, PROTIME in the last 168 hours. Cardiac Enzymes: No results for input(s): CKTOTAL, CKMB, CKMBINDEX, TROPONINI in the last 168 hours. BNP (last 3 results) No results for input(s): PROBNP in the last 8760 hours. HbA1C: No results for input(s): HGBA1C in the last 72 hours. CBG: Recent Labs  Lab 10/11/24 1642  GLUCAP 120*   Lipid Profile: No results for  input(s): CHOL, HDL, LDLCALC, TRIG, CHOLHDL, LDLDIRECT in the last 72 hours. Thyroid Function Tests: No results for input(s): TSH, T4TOTAL, FREET4, T3FREE, THYROIDAB in the last 72 hours. Anemia Panel: No results for input(s): VITAMINB12, FOLATE, FERRITIN, TIBC, IRON, RETICCTPCT in the last 72 hours. Sepsis Labs: No results for input(s): PROCALCITON, LATICACIDVEN in the last 168 hours.  No results found for this or any previous visit (from the past 240 hours).       Radiology Studies: No results found.      Scheduled Meds:  benztropine   1 mg Oral BID   clonazePAM   0.5 mg Oral TID   docusate  100 mg Oral Daily   enoxaparin  (LOVENOX ) injection  40 mg Subcutaneous Daily   feeding supplement  237 mL Oral BID BM   ferrous sulfate   325 mg Oral Q breakfast   folic acid   1 mg Oral Daily   gabapentin   200 mg Oral TID   mirtazapine   15 mg Oral QHS   multivitamin with minerals  1 tablet Oral Daily   nicotine   14 mg Transdermal Daily   pantoprazole   40 mg Oral QHS   polyethylene glycol  17 g Oral Daily   propranolol   20 mg Oral BID   senna  2 tablet Oral Daily   sodium chloride  flush  10-40 mL Intracatheter Q12H   thiamine   100 mg Oral Daily  valbenazine   40 mg Oral Daily   Continuous Infusions:   LOS: 92 days    Time spent: 35 Minutes    Boni Maclellan A Chelsie Burel, MD [facility]   If 7PM-7AM, please contact night-coverage [email or web address]  10/15/2024, 12:59 PM   "

## 2024-10-15 NOTE — Plan of Care (Signed)
" °  Problem: Education: Goal: Knowledge of General Education information will improve Description: Including pain rating scale, medication(s)/side effects and non-pharmacologic comfort measures 10/15/2024 0341 by Arno Pears, RN Outcome: Progressing 10/15/2024 0212 by Arno Pears, RN Outcome: Progressing   Problem: Health Behavior/Discharge Planning: Goal: Ability to manage health-related needs will improve 10/15/2024 0341 by Arno Pears, RN Outcome: Progressing 10/15/2024 0212 by Arno Pears, RN Outcome: Progressing   Problem: Clinical Measurements: Goal: Ability to maintain clinical measurements within normal limits will improve 10/15/2024 0341 by Arno Pears, RN Outcome: Progressing 10/15/2024 0212 by Arno Pears, RN Outcome: Progressing Goal: Will remain free from infection 10/15/2024 0341 by Arno Pears, RN Outcome: Progressing 10/15/2024 0212 by Arno Pears, RN Outcome: Progressing Goal: Diagnostic test results will improve 10/15/2024 0341 by Arno Pears, RN Outcome: Progressing 10/15/2024 0212 by Arno Pears, RN Outcome: Progressing Goal: Respiratory complications will improve 10/15/2024 0341 by Arno Pears, RN Outcome: Progressing 10/15/2024 0212 by Arno Pears, RN Outcome: Progressing Goal: Cardiovascular complication will be avoided 10/15/2024 0341 by Arno Pears, RN Outcome: Progressing 10/15/2024 0212 by Arno Pears, RN Outcome: Progressing   Problem: Activity: Goal: Risk for activity intolerance will decrease 10/15/2024 0341 by Arno Pears, RN Outcome: Progressing 10/15/2024 0212 by Arno Pears, RN Outcome: Progressing   Problem: Nutrition: Goal: Adequate nutrition will be maintained 10/15/2024 0341 by Arno Pears, RN Outcome: Progressing 10/15/2024 0212 by Arno Pears, RN Outcome: Progressing   Problem: Coping: Goal: Level of anxiety will decrease 10/15/2024 0341 by Arno Pears, RN Outcome: Progressing 10/15/2024 0212 by Arno Pears,  RN Outcome: Progressing   Problem: Elimination: Goal: Will not experience complications related to bowel motility 10/15/2024 0341 by Arno Pears, RN Outcome: Progressing 10/15/2024 0212 by Arno Pears, RN Outcome: Progressing Goal: Will not experience complications related to urinary retention 10/15/2024 0341 by Arno Pears, RN Outcome: Progressing 10/15/2024 0212 by Arno Pears, RN Outcome: Progressing   Problem: Pain Managment: Goal: General experience of comfort will improve and/or be controlled 10/15/2024 0341 by Arno Pears, RN Outcome: Progressing 10/15/2024 0212 by Arno Pears, RN Outcome: Progressing   Problem: Safety: Goal: Ability to remain free from injury will improve 10/15/2024 0341 by Arno Pears, RN Outcome: Progressing 10/15/2024 0212 by Arno Pears, RN Outcome: Progressing   Problem: Skin Integrity: Goal: Risk for impaired skin integrity will decrease 10/15/2024 0341 by Arno Pears, RN Outcome: Progressing 10/15/2024 0212 by Arno Pears, RN Outcome: Progressing   Problem: Education: Goal: Knowledge of the prescribed therapeutic regimen will improve 10/15/2024 0341 by Arno Pears, RN Outcome: Progressing 10/15/2024 0212 by Arno Pears, RN Outcome: Progressing   Problem: Clinical Measurements: Goal: Usual level of consciousness will be regained or maintained. 10/15/2024 0341 by Arno Pears, RN Outcome: Progressing 10/15/2024 0212 by Arno Pears, RN Outcome: Progressing Goal: Neurologic status will improve 10/15/2024 0341 by Arno Pears, RN Outcome: Progressing 10/15/2024 0212 by Arno Pears, RN Outcome: Progressing Goal: Ability to maintain intracranial pressure will improve 10/15/2024 0341 by Arno Pears, RN Outcome: Progressing 10/15/2024 0212 by Arno Pears, RN Outcome: Progressing   Problem: Skin Integrity: Goal: Demonstration of wound healing without infection will improve 10/15/2024 0341 by Arno Pears, RN Outcome:  Progressing 10/15/2024 0212 by Arno Pears, RN Outcome: Progressing   "

## 2024-10-15 NOTE — NC FL2 (Signed)
 " [place]  MEDICAID FL2 LEVEL OF CARE FORM     IDENTIFICATION  Patient Name: Ernest Romero Birthdate: 04-15-1966 Sex: male Admission Date (Current Location): 07/15/2024  [place] Number:  [name] [id number] T Facility and Address:  [facility], [address] [place], [place] [address]      Provider Number: [id number]  Attending Physician Name and Address:  Madelyne Owen LABOR, MD  Relative Name and Phone Number:  [name], friend - [phone]    Current Level of Care: Hospital Recommended Level of Care: Family Care Home Prior Approval Number:    Date Approved/Denied:   PASRR Number:    Discharge Plan: Other (Comment) (Group Home, Family Care Home)    Current Diagnoses: Patient Active Problem List   Diagnosis Date Noted   Infection of craniotomy plate 88/89/7974   Acute metabolic encephalopathy 08/05/2024   Protein-calorie malnutrition, severe 07/31/2024   Unable to make decisions about medical treatment due to impaired mental capacity 07/25/2024   Alcohol withdrawal (HCC) 07/02/2024   Hypophosphatemia 07/02/2024   Subdural hematoma (HCC) 06/30/2024   Behavior concern in adult 04/21/2024   Drug-seeking behavior 03/02/2024   Polysubstance abuse (HCC) 03/02/2024   Delusions (HCC) 04/15/2023   Cocaine abuse with cocaine-induced psychotic disorder, with delusions (HCC) 04/15/2023   Overweight with body mass index (BMI) of 28 to 28.9 in adult 08/30/2022   Essential hypertension 09/17/2018   Bipolar disorder (HCC) 04/22/2013    Orientation RESPIRATION BLADDER Height & Weight     Self  Normal Continent Weight: 161 lb 6 oz (73.2 kg) Height:  5' 10 (177.8 cm)  BEHAVIORAL SYMPTOMS/MOOD NEUROLOGICAL BOWEL NUTRITION STATUS      Continent Diet (please refer to dc summary)  AMBULATORY STATUS COMMUNICATION OF NEEDS Skin   Supervision Verbally Other (Comment) (closed surgical flap, upper left side of head)                        Personal Care Assistance Level of Assistance  Bathing, Feeding, Dressing Bathing Assistance: Limited assistance Feeding assistance: Independent Dressing Assistance: Limited assistance     Functional Limitations Info  Sight, Hearing, Speech Sight Info: Adequate Hearing Info: Adequate Speech Info: Adequate    SPECIAL CARE FACTORS FREQUENCY              Contractures Contractures Info: Not present    Additional Factors Info  Code Status, Allergies Code Status Info: Full code Allergies Info: NKA Psychotropic Info: Haldol          Current Medications (10/15/2024):  This is the current hospital active medication list Current Facility-Administered Medications  Medication Dose Route Frequency Provider Last Rate Last Admin   acetaminophen  (TYLENOL ) tablet 650 mg  650 mg Oral Q4H PRN Hongalgi, Anand D, MD   650 mg at 10/11/24 2141   Or   acetaminophen  (TYLENOL ) suppository 650 mg  650 mg Rectal Q4H PRN Hongalgi, Anand D, MD       benztropine  (COGENTIN ) tablet 1 mg  1 mg Oral BID Starkes-Perry, Takia S, FNP   1 mg at 10/15/24 1013   clonazePAM  (KLONOPIN ) tablet 0.5 mg  0.5 mg Oral TID Rai, Ripudeep K, MD   0.5 mg at 10/15/24 1524   docusate (COLACE) 50 MG/5ML liquid 100 mg  100 mg Oral Daily Hongalgi, Anand D, MD   100 mg at 10/14/24 9061   enoxaparin  (LOVENOX ) injection 40 mg  40 mg Subcutaneous Daily Pokhrel, Laxman, MD   40 mg  at 10/15/24 1014   feeding supplement (ENSURE PLUS HIGH PROTEIN) liquid 237 mL  237 mL Oral BID BM Danford, Lonni SQUIBB, MD   237 mL at 10/15/24 1525   ferrous sulfate  tablet 325 mg  325 mg Oral Q breakfast Regalado, Belkys A, MD   325 mg at 10/15/24 9362   folic acid  (FOLVITE ) tablet 1 mg  1 mg Oral Daily Ogbata, Sylvester I, MD   1 mg at 10/15/24 1011   gabapentin  (NEURONTIN ) capsule 200 mg  200 mg Oral TID Jonel Lonni SQUIBB, MD   200 mg at 10/15/24 1524   guaiFENesin -dextromethorphan  (ROBITUSSIN DM) 100-10 MG/5ML syrup 5 mL  5 mL Oral Q4H PRN  Pudota, Kingsley P, MD   5 mL at 09/19/24 1615   labetalol  (NORMODYNE ) injection 10-40 mg  10-40 mg Intravenous Q10 min PRN Janjua, Rashid M, MD       LORazepam  (ATIVAN ) tablet 1 mg  1 mg Oral Q6H PRN Sreeram, Narendranath, MD   1 mg at 10/13/24 1730   magic mouthwash w/lidocaine   5 mL Oral TID PRN Regalado, Belkys A, MD   5 mL at 09/03/24 1502   melatonin tablet 10 mg  10 mg Oral QHS PRN Shalhoub, George J, MD   10 mg at 10/14/24 2141   mirtazapine  (REMERON ) tablet 15 mg  15 mg Oral QHS McCarty, Artie, MD   15 mg at 10/14/24 2103   multivitamin with minerals tablet 1 tablet  1 tablet Oral Daily Rosario Eland I, MD   1 tablet at 10/15/24 1011   nicotine  (NICODERM CQ  - dosed in mg/24 hours) patch 14 mg  14 mg Transdermal Daily Fredia Dorothe HERO, MD   14 mg at 10/15/24 1013   ondansetron  (ZOFRAN ) tablet 4 mg  4 mg Oral Q4H PRN Janjua, Rashid M, MD       Or   ondansetron  (ZOFRAN ) injection 4 mg  4 mg Intravenous Q4H PRN Janjua, Rashid M, MD   4 mg at 08/01/24 1506   Oral care mouth rinse  15 mL Mouth Rinse PRN Mannam, Praveen, MD       pantoprazole  (PROTONIX ) EC tablet 40 mg  40 mg Oral QHS Hongalgi, Anand D, MD   40 mg at 10/14/24 2103   phenol (CHLORASEPTIC) mouth spray 1 spray  1 spray Mouth/Throat PRN Franky Redia SAILOR, MD   1 spray at 09/03/24 0057   polyethylene glycol (MIRALAX  / GLYCOLAX ) packet 17 g  17 g Oral Daily Desai, Rahul P, PA-C   17 g at 10/14/24 9061   promethazine  (PHENERGAN ) tablet 12.5-25 mg  12.5-25 mg Oral Q4H PRN Janjua, Rashid M, MD       propranolol  (INDERAL ) tablet 20 mg  20 mg Oral BID Regalado, Belkys A, MD   20 mg at 10/14/24 2103   senna (SENOKOT) tablet 17.2 mg  2 tablet Oral Daily Desai, Rahul P, PA-C   17.2 mg at 10/14/24 9062   sodium chloride  flush (NS) 0.9 % injection 10-40 mL  10-40 mL Intracatheter Q12H Franky Redia SAILOR, MD   10 mL at 10/15/24 1020   sodium chloride  flush (NS) 0.9 % injection 10-40 mL  10-40 mL Intracatheter PRN Franky Redia SAILOR,  MD   30 mL at 07/29/24 1135   thiamine  (VITAMIN B1) tablet 100 mg  100 mg Oral Daily Hongalgi, Anand D, MD   100 mg at 10/15/24 1011   valbenazine  (INGREZZA ) capsule 40 mg  40 mg Oral Daily Starkes-Perry, Majel RAMAN, FNP  40 mg at 10/15/24 1012     Discharge Medications: Please see discharge summary for a list of discharge medications.  Relevant Imaging Results:  Relevant Lab Results:   Additional Information SSN [id number]  [name], LCSWA     "

## 2024-10-15 NOTE — Plan of Care (Signed)
  Problem: Education: Goal: Knowledge of General Education information will improve Description: Including pain rating scale, medication(s)/side effects and non-pharmacologic comfort measures Outcome: Progressing   Problem: Health Behavior/Discharge Planning: Goal: Ability to manage health-related needs will improve Outcome: Progressing   Problem: Health Behavior/Discharge Planning: Goal: Ability to manage health-related needs will improve Outcome: Progressing   

## 2024-10-16 NOTE — Progress Notes (Signed)
 " PROGRESS NOTE    Ernest Romero  FMW:[id number] DOB: Mar 02, 1966 DOA: 07/15/2024 PCP: [name]   Brief Narrative:  59 year old with past medical history significant for alcohol abuse, bipolar disorder, panic disorder presented secondary to being found down, he was found to have a large subdural hematoma, underwent left frontoparietal craniotomy with evacuation of the subdural hematoma and placement of a drain and subsequent removal.  Patient required further management of his craniotomy and flap which necessitated transfer from [place] to [facility] where neurosurgery performed the left side allograft cranioplasty.  Repeat CT head was significant with worsening fluid requiring left frontal burr hole for evacuation on 10/30.  Hospitalization further complicated by development of an infected bone flap with culture grew  MRSA.  Patient underwent left bone flap removal and was treated with IV antibiotics for 6 weeks per infectious disease recommendation he completed 2 weeks of oral vancomycin  to complete a total of 8 weeks of antibiotics on 1/6 He is currently ward of the state now through DSS.  Currently does not have any bed offers, difficult to place.    10/27 crani w/ flap replacement 10/29: off clevi, liberate SBP goal, Klonopin , lithium , Haldol  as needed discontinued.  Latuda  dose cut by half 10/30: repeat CT head with increased collection - going to OR this afternoon for evacuation 10/31: OR  for SDH evacuation.  Developed arm twitching.  Klonopin  restarted at half dose 11/1: More awake.  No complaint 11/2 transferred out of ICU. 11/4 cortrak removed 11/8 CT head showed fluid collection under bone flap, decision made to remove bone flap 11/10 to OR for L bone flap removal 11/11 repeat CT head stable 11/29: notice to have worsening Dysphagia.  12/1 repeat CT head: stable.  12/3: Cortrack placed for nutrition, sutures removed by neurosurgery.  Tube feeds  started. 12/4: Very sedated, psychiatry requested to readjust meds, reduced clonazepam  to0.5mg  BID, decreased valbenazine  to 40 mg daily 12/5: Noted oriented but jaw tremors worse.  Evaluated by neurology, recommended psych to continue to follow and adjust medications  Assessment & Plan:   Principal Problem:   Subdural hematoma (HCC) Active Problems:   Bipolar disorder (HCC)   Unable to make decisions about medical treatment due to impaired mental capacity   Alcohol withdrawal (HCC)   Essential hypertension   Protein-calorie malnutrition, severe   Acute metabolic encephalopathy   Infection of craniotomy plate  1-Recurrent subdural hematoma: - Patient underwent craniotomy and bur hole procedure per neurosurgery - Per neurosurgery patient could have reaccumulation of fluid.  No new neurological changes. Neurological stable.    MRSA infected bone flap - Infectious disease consulted and patient was treated with IV vancomycin  for 6 weeks completed 09/23/24. He was subsequently started on 2 weeks of doxycycline  which completed 1/6   Acute metabolic encephalopathy - Related to above, mental status is stable, he required intermittent safety sitter   Bipolar disorder: Continue Ingressa, cogentin .    Drug-induced parkinsonism / Akathisia: Likely related  to use of antipsychotics, EEG obtained and was negative. Continue propranolol , Klonopin , and Ingrezza    Dysphagia: -Patient required NG tube for tube feeding from 12/3 until 12/8 -On  dysphagia 3 diet and doing well.   HTN: Continue propranolol , amlodipine  and losartan .  Blood pressure controlled.   Sinus bradycardia: Complicated by propranolol , dose reduced hold the parameters   Severe malnutrition: Continue with supplement   Iron deficiency anemia: Continue with ferrous sulfate   Aspiration pneumonia: Completed IV Unasyn   Acute hypoxic respiratory  failure secondary to pneumonia. Resolved   Alcohol use disorder, alcohol  withdrawal Completed CIWA and phenobarbital  taper. Continue thiamine  folic acid  and multivitamin  DVT prophylaxis: enoxaparin  (LOVENOX ) injection 40 mg Start: 10/01/24 1400 Place TED hose Start: 08/12/24 1150 SCDs Start: 08/01/24 1745 Place and maintain sequential compression device Start: 07/29/24 1819   Code Status: Full Code  Family Communication:  None present at bedside.  Plan of care discussed with patient in length and he/she verbalized understanding and agreed with it.  Status is: Inpatient Remains inpatient appropriate because: Medically stable, pending placement   Estimated body mass index is 24.17 kg/m as calculated from the following:   Height as of this encounter: 5' 10 (1.778 m).   Weight as of this encounter: 76.4 kg.    Nutritional Assessment: Body mass index is 24.17 kg/m.SABRA Seen by dietician.  I agree with the assessment and plan as outlined below: Nutrition Status: Nutrition Problem: Severe Malnutrition Etiology: acute illness Signs/Symptoms: severe muscle depletion, moderate muscle depletion, percent weight loss Percent weight loss: 15 % (in 3 months) Interventions: Ensure Enlive (each supplement provides 350kcal and 20 grams of protein), MVI, Magic cup, Refer to RD note for recommendations  . Skin Assessment: I have examined the patient's skin and I agree with the wound assessment as performed by the wound care RN as outlined below:    Consultants:  Psychiatry, neurosurgery, neurology  Procedures:  Left-sided allograft cranioplasty Left frontal burr hole for evacuation of subdural hematoma Left bone flap removal Cortrak placement/removal  Antimicrobials:  Anti-infectives (From admission, onward)    Start     Dose/Rate Route Frequency Ordered Stop   09/24/24 1000  doxycycline  (VIBRA -TABS) tablet 100 mg  Status:  Discontinued        100 mg Oral Every 12 hours 09/21/24 1455 09/23/24 1142   09/24/24 1000  doxycycline  (VIBRA -TABS) tablet 100 mg         100 mg Oral Every 12 hours 09/23/24 1142 10/07/24 2003   09/13/24 1000  vancomycin  (VANCOCIN ) IVPB 1000 mg/200 mL premix        1,000 mg 200 mL/hr over 60 Minutes Intravenous Every 12 hours 09/13/24 0835 09/24/24 0559   09/02/24 1700  Ampicillin -Sulbactam (UNASYN ) 3 g in sodium chloride  0.9 % 100 mL IVPB        3 g 200 mL/hr over 30 Minutes Intravenous Every 6 hours 09/02/24 1611 09/07/24 1230   08/31/24 1000  vancomycin  (VANCOREADY) IVPB 750 mg/150 mL  Status:  Discontinued        750 mg 150 mL/hr over 60 Minutes Intravenous Every 12 hours 08/31/24 0015 09/13/24 0835   08/21/24 0800  vancomycin  (VANCOCIN ) IVPB 1000 mg/200 mL premix  Status:  Discontinued        1,000 mg 200 mL/hr over 60 Minutes Intravenous Every 12 hours 08/21/24 0720 08/31/24 0015   08/19/24 0600  vancomycin  (VANCOREADY) IVPB 1250 mg/250 mL  Status:  Discontinued        1,250 mg 166.7 mL/hr over 90 Minutes Intravenous Every 12 hours 08/18/24 1818 08/21/24 0547   08/18/24 1830  vancomycin  (VANCOREADY) IVPB 500 mg/100 mL        500 mg 100 mL/hr over 60 Minutes Intravenous  Once 08/18/24 1817 08/19/24 1642   08/16/24 1600  vancomycin  (VANCOREADY) IVPB 750 mg/150 mL  Status:  Discontinued        750 mg 150 mL/hr over 60 Minutes Intravenous Every 8 hours 08/16/24 0828 08/18/24 1818   08/14/24 0800  vancomycin  (VANCOCIN )  IVPB 1000 mg/200 mL premix  Status:  Discontinued        1,000 mg 200 mL/hr over 60 Minutes Intravenous Every 8 hours 08/14/24 0016 08/16/24 0828   08/14/24 0030  vancomycin  (VANCOCIN ) IVPB 1000 mg/200 mL premix        1,000 mg 200 mL/hr over 60 Minutes Intravenous NOW 08/14/24 0016 08/14/24 0135   08/13/24 0100  vancomycin  (VANCOREADY) IVPB 750 mg/150 mL  Status:  Discontinued        750 mg 150 mL/hr over 60 Minutes Intravenous Every 12 hours 08/12/24 1230 08/14/24 0007   08/12/24 1400  ceFAZolin  (ANCEF ) IVPB 2g/100 mL premix  Status:  Discontinued        2 g 200 mL/hr over 30 Minutes Intravenous  Every 8 hours 08/12/24 1149 08/12/24 1230   08/12/24 1400  cefTAZidime  (FORTAZ ) 2 g in sodium chloride  0.9 % 100 mL IVPB  Status:  Discontinued        2 g 200 mL/hr over 30 Minutes Intravenous Every 8 hours 08/12/24 1152 08/13/24 1109   08/12/24 1245  ceFAZolin  (ANCEF ) IVPB 2g/100 mL premix  Status:  Discontinued        2 g 200 mL/hr over 30 Minutes Intravenous  Once 08/12/24 1149 08/12/24 1449   08/12/24 1245  vancomycin  (VANCOREADY) IVPB 1250 mg/250 mL        1,250 mg 166.7 mL/hr over 90 Minutes Intravenous  Once 08/12/24 1152 08/12/24 1501   08/12/24 0957  vancomycin  (VANCOCIN ) powder  Status:  Discontinued          As needed 08/12/24 1008 08/12/24 1037   08/01/24 2200  ceFAZolin  (ANCEF ) IVPB 2g/100 mL premix        2 g 200 mL/hr over 30 Minutes Intravenous Every 8 hours 08/01/24 1703 08/02/24 1915   08/01/24 1530  vancomycin  (VANCOCIN ) powder  Status:  Discontinued          As needed 08/01/24 1559 08/01/24 1606   08/01/24 1421  ceFAZolin  (ANCEF ) 2-4 GM/100ML-% IVPB       Note to Pharmacy: Roslynn Birmingham: cabinet override      08/01/24 1421 08/02/24 0229   07/29/24 2200  ceFAZolin  (ANCEF ) IVPB 2g/100 mL premix        2 g 200 mL/hr over 30 Minutes Intravenous Every 8 hours 07/29/24 1859 07/30/24 1423   07/29/24 1654  vancomycin  (VANCOCIN ) powder  Status:  Discontinued          As needed 07/29/24 1655 07/29/24 1758   07/29/24 1515  ceFAZolin  (ANCEF ) IVPB 2g/100 mL premix        2 g 200 mL/hr over 30 Minutes Intravenous  Once 07/29/24 1421 07/29/24 1655         Subjective: Seen and examined.  No complaints.  Objective: Vitals:   10/15/24 2030 10/16/24 0027 10/16/24 0500 10/16/24 1004  BP: 125/85 116/83    Pulse: 62 (!) 59  (!) 55  Resp: 18 18    Temp: 98.4 F (36.9 C) 97.7 F (36.5 C)    TempSrc: Oral Oral    SpO2: 97% 96%    Weight:   76.4 kg   Height:        Intake/Output Summary (Last 24 hours) at 10/16/2024 1256 Last data filed at 10/16/2024 1235 Gross per 24 hour   Intake 700 ml  Output --  Net 700 ml   Filed Weights   10/13/24 0500 10/15/24 0448 10/16/24 0500  Weight: 75.3 kg 73.2 kg 76.4 kg  Examination:  General exam: Appears calm and comfortable  Respiratory system: Clear to auscultation. Respiratory effort normal. Cardiovascular system: S1 & S2 heard, RRR. No JVD, murmurs, rubs, gallops or clicks. No pedal edema. Gastrointestinal system: Abdomen is nondistended, soft and nontender. No organomegaly or masses felt. Normal bowel sounds heard. Central nervous system: Alert and oriented. No focal neurological deficits. Extremities: Symmetric 5 x 5 power. Skin: No rashes, lesions or ulcers   Data Reviewed: I have personally reviewed following labs and imaging studies  CBC: Recent Labs  Lab 10/10/24 0849  WBC 5.1  HGB 14.2  HCT 43.9  MCV 92.6  PLT 204   Basic Metabolic Panel: Recent Labs  Lab 10/10/24 0849  NA 142  K 4.2  CL 107  CO2 27  GLUCOSE 100*  BUN 26*  CREATININE 1.03  CALCIUM 9.4   GFR: Estimated Creatinine Clearance: 78.7 mL/min (by C-G formula based on SCr of 1.03 mg/dL). Liver Function Tests: No results for input(s): AST, ALT, ALKPHOS, BILITOT, PROT, ALBUMIN  in the last 168 hours. No results for input(s): LIPASE, AMYLASE in the last 168 hours. No results for input(s): AMMONIA in the last 168 hours. Coagulation Profile: No results for input(s): INR, PROTIME in the last 168 hours. Cardiac Enzymes: No results for input(s): CKTOTAL, CKMB, CKMBINDEX, TROPONINI in the last 168 hours. BNP (last 3 results) No results for input(s): PROBNP in the last 8760 hours. HbA1C: No results for input(s): HGBA1C in the last 72 hours. CBG: Recent Labs  Lab 10/11/24 1642  GLUCAP 120*   Lipid Profile: No results for input(s): CHOL, HDL, LDLCALC, TRIG, CHOLHDL, LDLDIRECT in the last 72 hours. Thyroid Function Tests: No results for input(s): TSH, T4TOTAL, FREET4,  T3FREE, THYROIDAB in the last 72 hours. Anemia Panel: No results for input(s): VITAMINB12, FOLATE, FERRITIN, TIBC, IRON, RETICCTPCT in the last 72 hours. Sepsis Labs: No results for input(s): PROCALCITON, LATICACIDVEN in the last 168 hours.  No results found for this or any previous visit (from the past 240 hours).   Radiology Studies: No results found.  Scheduled Meds:  benztropine   1 mg Oral BID   clonazePAM   0.5 mg Oral TID   docusate  100 mg Oral Daily   enoxaparin  (LOVENOX ) injection  40 mg Subcutaneous Daily   feeding supplement  237 mL Oral BID BM   ferrous sulfate   325 mg Oral Q breakfast   folic acid   1 mg Oral Daily   gabapentin   200 mg Oral TID   mirtazapine   15 mg Oral QHS   multivitamin with minerals  1 tablet Oral Daily   nicotine   14 mg Transdermal Daily   pantoprazole   40 mg Oral QHS   polyethylene glycol  17 g Oral Daily   propranolol   20 mg Oral BID   senna  2 tablet Oral Daily   sodium chloride  flush  10-40 mL Intracatheter Q12H   thiamine   100 mg Oral Daily   valbenazine   40 mg Oral Daily   Continuous Infusions:   LOS: 93 days   Fredia Skeeter, MD [facility]  10/16/2024, 12:56 PM   *Please note that this is a verbal dictation therefore any spelling or grammatical errors are due to the Dragon Medical One system interpretation.  Please page via Amion and do not message via secure chat for urgent patient care matters. Secure chat can be used for non urgent patient care matters.  How to contact the [facility] Attending or Consulting provider 7A - 7P or covering provider during  after hours 7P -7A, for this patient?  Check the care team in [facility] and look for a) attending/consulting [facility] provider listed and b) the [facility] team listed. Page or secure chat 7A-7P. Log into [email or web address] and use [place]'s universal password to access. If you do not have the password, please contact the hospital operator. Locate the [facility] provider you are looking  for under [facility] and page to a number that you can be directly reached. If you still have difficulty reaching the provider, please page the [facility] (Director on Call) for the Hospitalists listed on amion for assistance.  "

## 2024-10-16 NOTE — Progress Notes (Signed)
 Physical Therapy Treatment Patient Details Name: Ernest Romero MRN: [id number] DOB: 1965/12/16 Today's Date: 10/16/2024   History of Present Illness 59 y.o. male presenting 07/16/24 from [facility] where he was [facility]. 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 On CIWA protocol. Transferred to [facility] for further management of craniotomy and flap. 10/27 s/p cranioplasty with bone flap replacement. 10/28 Change in status thought to be related to Klonopin  administration 10/28, repeat CTH shows significant increase in size of mixed attenuation subdural fluid collection underlying the L tempoparietal craniotomy site, now measuring approximately 15 mm in thickness, with associated mild mass effect and slight rightward midline shift. OR on 10/30 for SDH evacuation. [facility] 11/7 with fluid collected under bone flap. 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 Worsening dysphagia 11/29; repeat CTH stable 12/1. Cortrak placed 12/3-12/8. PMH includes alcohol use disorder, psychiatric disorder, HTN.    PT Comments  Patient progressing with high level balance activities.  Still with mild posterior bias so working on hip flexion for hip strategy while on balance disc.  Also working on lateral movements over cones and step taps for improved foot clearance.  PT will continue to follow.     If plan is discharge home, recommend the following: Assistance with cooking/housework;Direct supervision/assist for medications management;Direct supervision/assist for financial management;Assist for transportation;Help with stairs or ramp for entrance;Supervision due to cognitive status;A little help with walking and/or transfers;A little help with bathing/dressing/bathroom   Can travel by private vehicle     Yes  Equipment Recommendations  None recommended by PT    Recommendations for Other  Services       Precautions / Restrictions Precautions Precautions: Fall Recall of Precautions/Restrictions: Impaired Precaution/Restrictions Comments: SBP <160, helmet (pt to wear when OOB)     Mobility  Bed Mobility               General bed mobility comments: on EOB    Transfers Overall transfer level: Needs assistance Equipment used: None Transfers: Sit to/from Stand Sit to Stand: Supervision           General transfer comment: leaning down to don socks with high fall risk from EOB cues for crossed leg technique    Ambulation/Gait Ambulation/Gait assistance: Supervision Gait Distance (Feet): 225 Feet Assistive device: None Gait Pattern/deviations: Step-through pattern, Decreased stride length, Shuffle       General Gait Details: scuffing fleet on floor at times; cues for direction (needing to be repeated at times)   Stairs Stairs: Yes Stairs assistance: Supervision Stair Management: Two rails, Alternating pattern, Forwards Number of Stairs: 3 General stair comments: wooden steps in 3W gym   Wheelchair Mobility     Tilt Bed    Modified Rankin (Stroke Patients Only)       Balance Overall balance assessment: Needs assistance   Sitting balance-Leahy Scale: Good       Standing balance-Leahy Scale: Good                 High Level Balance Comments: standing side steps over cones hold stair rail x10; standing alternate step taps to 4 step x 10 without UE support (occasional CGA); forward lunges onto balance disc in 3W gym with HHA and stair rail 2 x 10 reps (A for balance disc stability); side kicks holding stair rail alternating x 10; ant/post rocking standing on balance disc holding stair  rail with cues and facilitation for hip flexion during posterior rocking.            Communication Communication Communication: No apparent difficulties  Cognition Arousal: Alert Behavior During Therapy: WFL for tasks assessed/performed   PT -  Cognitive impairments: Memory, Safety/Judgement, Attention                   Rancho Levels of Cognitive Functioning Ranch[place] Amigos Scales of Cognitive Functioning: Automatic, Appropriate: Minimal Assistance for Daily Living Skills Ranch[place] Amigos Scales of Cognitive Functioning: Automatic, Appropriate: Minimal Assistance for Daily Living Skills [VII] PT - Cognition Comments: needs repeat directions for balance activity after seated rest Following commands: Impaired Following commands impaired: Follows multi-step commands with increased time    Cueing Cueing Techniques: Verbal cues, Visual cues  Exercises      General Comments        Pertinent Vitals/Pain Pain Assessment Pain Assessment: No/denies pain    Home Living                          Prior Function            PT Goals (current goals can now be found in the care plan section) Progress towards PT goals: Progressing toward goals    Frequency    Min 2X/week      PT Plan      Co-evaluation              AM-PAC PT 6 Clicks Mobility   Outcome Measure  Help needed turning from your back to your side while in a flat bed without using bedrails?: None Help needed moving from lying on your back to sitting on the side of a flat bed without using bedrails?: None Help needed moving to and from a bed to a chair (including a wheelchair)?: A Little Help needed standing up from a chair using your arms (e.g., wheelchair or bedside chair)?: A Little Help needed to walk in hospital room?: A Little Help needed climbing 3-5 steps with a railing? : A Little 6 Click Score: 20    End of Session Equipment Utilized During Treatment: Other (comment) (helmet) Activity Tolerance: Patient tolerated treatment well Patient left: in bed (seated EOB) Nurse Communication: Other (comment) (no sitter; seated EOB with alarm) PT Visit Diagnosis: Other abnormalities of gait and mobility (R26.89);Other symptoms and  signs involving the nervous system (R29.898)     Time: 8395-8367 PT Time Calculation (min) (ACUTE ONLY): 28 min  Charges:    $Gait Training: 8-22 mins $Therapeutic Activity: 8-22 mins PT General Charges $$ ACUTE PT VISIT: 1 Visit                     Micheline Romero, PT Acute Rehabilitation Services Office:[phone] 10/16/2024    Ernest Romero 10/16/2024, 5:37 PM

## 2024-10-16 NOTE — Plan of Care (Signed)
  Problem: Education: Goal: Knowledge of General Education information will improve Description: Including pain rating scale, medication(s)/side effects and non-pharmacologic comfort measures Outcome: Progressing   Problem: Health Behavior/Discharge Planning: Goal: Ability to manage health-related needs will improve Outcome: Progressing   Problem: Clinical Measurements: Goal: Ability to maintain clinical measurements within normal limits will improve Outcome: Progressing Goal: Will remain free from infection Outcome: Progressing Goal: Diagnostic test results will improve Outcome: Progressing Goal: Respiratory complications will improve Outcome: Progressing Goal: Cardiovascular complication will be avoided Outcome: Progressing   Problem: Activity: Goal: Risk for activity intolerance will decrease Outcome: Progressing   Problem: Nutrition: Goal: Adequate nutrition will be maintained Outcome: Progressing   Problem: Coping: Goal: Level of anxiety will decrease Outcome: Progressing   Problem: Elimination: Goal: Will not experience complications related to bowel motility Outcome: Progressing Goal: Will not experience complications related to urinary retention Outcome: Progressing   Problem: Pain Managment: Goal: General experience of comfort will improve and/or be controlled Outcome: Progressing   Problem: Safety: Goal: Ability to remain free from injury will improve Outcome: Progressing   Problem: Skin Integrity: Goal: Risk for impaired skin integrity will decrease Outcome: Progressing   Problem: Education: Goal: Knowledge of the prescribed therapeutic regimen will improve Outcome: Progressing   Problem: Clinical Measurements: Goal: Usual level of consciousness will be regained or maintained. Outcome: Progressing Goal: Neurologic status will improve Outcome: Progressing Goal: Ability to maintain intracranial pressure will improve Outcome: Progressing    Problem: Skin Integrity: Goal: Demonstration of wound healing without infection will improve Outcome: Progressing

## 2024-10-17 NOTE — Progress Notes (Signed)
 " PROGRESS NOTE    Ernest Romero  FMW:[id number] DOB: Jul 28, 1966 DOA: 07/15/2024 PCP: [name]   Brief Narrative:  59 year old with past medical history significant for alcohol abuse, bipolar disorder, panic disorder presented secondary to being found down, he was found to have a large subdural hematoma, underwent left frontoparietal craniotomy with evacuation of the subdural hematoma and placement of a drain and subsequent removal.  Patient required further management of his craniotomy and flap which necessitated transfer from [place] to [facility] where neurosurgery performed the left side allograft cranioplasty.  Repeat CT head was significant with worsening fluid requiring left frontal burr hole for evacuation on 10/30.  Hospitalization further complicated by development of an infected bone flap with culture grew  MRSA.  Patient underwent left bone flap removal and was treated with IV antibiotics for 6 weeks per infectious disease recommendation he completed 2 weeks of oral vancomycin  to complete a total of 8 weeks of antibiotics on 1/6 He is currently ward of the state now through DSS.  Currently does not have any bed offers, difficult to place.    10/27 crani w/ flap replacement 10/29: off clevi, liberate SBP goal, Klonopin , lithium , Haldol  as needed discontinued.  Latuda  dose cut by half 10/30: repeat CT head with increased collection - going to OR this afternoon for evacuation 10/31: OR  for SDH evacuation.  Developed arm twitching.  Klonopin  restarted at half dose 11/1: More awake.  No complaint 11/2 transferred out of ICU. 11/4 cortrak removed 11/8 CT head showed fluid collection under bone flap, decision made to remove bone flap 11/10 to OR for L bone flap removal 11/11 repeat CT head stable 11/29: notice to have worsening Dysphagia.  12/1 repeat CT head: stable.  12/3: Cortrack placed for nutrition, sutures removed by neurosurgery.  Tube feeds  started. 12/4: Very sedated, psychiatry requested to readjust meds, reduced clonazepam  to0.5mg  BID, decreased valbenazine  to 40 mg daily 12/5: Noted oriented but jaw tremors worse.  Evaluated by neurology, recommended psych to continue to follow and adjust medications  Assessment & Plan:   Principal Problem:   Subdural hematoma (HCC) Active Problems:   Bipolar disorder (HCC)   Unable to make decisions about medical treatment due to impaired mental capacity   Alcohol withdrawal (HCC)   Essential hypertension   Protein-calorie malnutrition, severe   Acute metabolic encephalopathy   Infection of craniotomy plate  1-Recurrent subdural hematoma: - Patient underwent craniotomy and bur hole procedure per neurosurgery - Per neurosurgery patient could have reaccumulation of fluid.  No new neurological changes. Neurological stable.    MRSA infected bone flap - Infectious disease consulted and patient was treated with IV vancomycin  for 6 weeks completed 09/23/24. He was subsequently started on 2 weeks of doxycycline  which completed 1/6   Acute metabolic encephalopathy - Related to above, mental status is stable, he required intermittent safety sitter   Bipolar disorder: Continue Ingressa, cogentin .    Drug-induced parkinsonism / Akathisia: Likely related  to use of antipsychotics, EEG obtained and was negative. Continue propranolol , Klonopin , and Ingrezza    Dysphagia: -Patient required NG tube for tube feeding from 12/3 until 12/8 -On  dysphagia 3 diet and doing well.   HTN: Continue propranolol , amlodipine  and losartan .  Blood pressure controlled.   Sinus bradycardia: Complicated by propranolol , dose reduced hold the parameters   Severe malnutrition: Continue with supplement   Iron deficiency anemia: Continue with ferrous sulfate   Aspiration pneumonia: Completed IV Unasyn   Acute hypoxic respiratory  failure secondary to pneumonia. Resolved   Alcohol use disorder, alcohol  withdrawal Completed CIWA and phenobarbital  taper. Continue thiamine  folic acid  and multivitamin  DVT prophylaxis: enoxaparin  (LOVENOX ) injection 40 mg Start: 10/01/24 1400 Place TED hose Start: 08/12/24 1150 SCDs Start: 08/01/24 1745 Place and maintain sequential compression device Start: 07/29/24 1819   Code Status: Full Code  Family Communication:  None present at bedside.  Plan of care discussed with patient in length and he/she verbalized understanding and agreed with it.  Status is: Inpatient Remains inpatient appropriate because: Medically stable, pending placement   Estimated body mass index is 24.17 kg/m as calculated from the following:   Height as of this encounter: 5' 10 (1.778 m).   Weight as of this encounter: 76.4 kg.    Nutritional Assessment: Body mass index is 24.17 kg/m.SABRA Seen by dietician.  I agree with the assessment and plan as outlined below: Nutrition Status: Nutrition Problem: Severe Malnutrition Etiology: acute illness Signs/Symptoms: severe muscle depletion, moderate muscle depletion, percent weight loss Percent weight loss: 15 % (in 3 months) Interventions: Ensure Enlive (each supplement provides 350kcal and 20 grams of protein), MVI, Magic cup, Refer to RD note for recommendations  . Skin Assessment: I have examined the patient's skin and I agree with the wound assessment as performed by the wound care RN as outlined below:    Consultants:  Psychiatry, neurosurgery, neurology  Procedures:  Left-sided allograft cranioplasty Left frontal burr hole for evacuation of subdural hematoma Left bone flap removal Cortrak placement/removal  Antimicrobials:  Anti-infectives (From admission, onward)    Start     Dose/Rate Route Frequency Ordered Stop   09/24/24 1000  doxycycline  (VIBRA -TABS) tablet 100 mg  Status:  Discontinued        100 mg Oral Every 12 hours 09/21/24 1455 09/23/24 1142   09/24/24 1000  doxycycline  (VIBRA -TABS) tablet 100 mg         100 mg Oral Every 12 hours 09/23/24 1142 10/07/24 2003   09/13/24 1000  vancomycin  (VANCOCIN ) IVPB 1000 mg/200 mL premix        1,000 mg 200 mL/hr over 60 Minutes Intravenous Every 12 hours 09/13/24 0835 09/24/24 0559   09/02/24 1700  Ampicillin -Sulbactam (UNASYN ) 3 g in sodium chloride  0.9 % 100 mL IVPB        3 g 200 mL/hr over 30 Minutes Intravenous Every 6 hours 09/02/24 1611 09/07/24 1230   08/31/24 1000  vancomycin  (VANCOREADY) IVPB 750 mg/150 mL  Status:  Discontinued        750 mg 150 mL/hr over 60 Minutes Intravenous Every 12 hours 08/31/24 0015 09/13/24 0835   08/21/24 0800  vancomycin  (VANCOCIN ) IVPB 1000 mg/200 mL premix  Status:  Discontinued        1,000 mg 200 mL/hr over 60 Minutes Intravenous Every 12 hours 08/21/24 0720 08/31/24 0015   08/19/24 0600  vancomycin  (VANCOREADY) IVPB 1250 mg/250 mL  Status:  Discontinued        1,250 mg 166.7 mL/hr over 90 Minutes Intravenous Every 12 hours 08/18/24 1818 08/21/24 0547   08/18/24 1830  vancomycin  (VANCOREADY) IVPB 500 mg/100 mL        500 mg 100 mL/hr over 60 Minutes Intravenous  Once 08/18/24 1817 08/19/24 1642   08/16/24 1600  vancomycin  (VANCOREADY) IVPB 750 mg/150 mL  Status:  Discontinued        750 mg 150 mL/hr over 60 Minutes Intravenous Every 8 hours 08/16/24 0828 08/18/24 1818   08/14/24 0800  vancomycin  (VANCOCIN )  IVPB 1000 mg/200 mL premix  Status:  Discontinued        1,000 mg 200 mL/hr over 60 Minutes Intravenous Every 8 hours 08/14/24 0016 08/16/24 0828   08/14/24 0030  vancomycin  (VANCOCIN ) IVPB 1000 mg/200 mL premix        1,000 mg 200 mL/hr over 60 Minutes Intravenous NOW 08/14/24 0016 08/14/24 0135   08/13/24 0100  vancomycin  (VANCOREADY) IVPB 750 mg/150 mL  Status:  Discontinued        750 mg 150 mL/hr over 60 Minutes Intravenous Every 12 hours 08/12/24 1230 08/14/24 0007   08/12/24 1400  ceFAZolin  (ANCEF ) IVPB 2g/100 mL premix  Status:  Discontinued        2 g 200 mL/hr over 30 Minutes Intravenous  Every 8 hours 08/12/24 1149 08/12/24 1230   08/12/24 1400  cefTAZidime  (FORTAZ ) 2 g in sodium chloride  0.9 % 100 mL IVPB  Status:  Discontinued        2 g 200 mL/hr over 30 Minutes Intravenous Every 8 hours 08/12/24 1152 08/13/24 1109   08/12/24 1245  ceFAZolin  (ANCEF ) IVPB 2g/100 mL premix  Status:  Discontinued        2 g 200 mL/hr over 30 Minutes Intravenous  Once 08/12/24 1149 08/12/24 1449   08/12/24 1245  vancomycin  (VANCOREADY) IVPB 1250 mg/250 mL        1,250 mg 166.7 mL/hr over 90 Minutes Intravenous  Once 08/12/24 1152 08/12/24 1501   08/12/24 0957  vancomycin  (VANCOCIN ) powder  Status:  Discontinued          As needed 08/12/24 1008 08/12/24 1037   08/01/24 2200  ceFAZolin  (ANCEF ) IVPB 2g/100 mL premix        2 g 200 mL/hr over 30 Minutes Intravenous Every 8 hours 08/01/24 1703 08/02/24 1915   08/01/24 1530  vancomycin  (VANCOCIN ) powder  Status:  Discontinued          As needed 08/01/24 1559 08/01/24 1606   08/01/24 1421  ceFAZolin  (ANCEF ) 2-4 GM/100ML-% IVPB       Note to Pharmacy: Roslynn Birmingham: cabinet override      08/01/24 1421 08/02/24 0229   07/29/24 2200  ceFAZolin  (ANCEF ) IVPB 2g/100 mL premix        2 g 200 mL/hr over 30 Minutes Intravenous Every 8 hours 07/29/24 1859 07/30/24 1423   07/29/24 1654  vancomycin  (VANCOCIN ) powder  Status:  Discontinued          As needed 07/29/24 1655 07/29/24 1758   07/29/24 1515  ceFAZolin  (ANCEF ) IVPB 2g/100 mL premix        2 g 200 mL/hr over 30 Minutes Intravenous  Once 07/29/24 1421 07/29/24 1655         Subjective: Patient seen and examined.  Sleepy but arousable.  Has no complaints.  Sitter at the bedside.  Objective: Vitals:   10/16/24 1004 10/16/24 1942 10/17/24 0500 10/17/24 0629  BP:  105/72  105/79  Pulse: (!) 55 60  (!) 51  Resp:    18  Temp:    (!) 97.3 F (36.3 C)  TempSrc:    Oral  SpO2:  95%  96%  Weight:   76.4 kg   Height:        Intake/Output Summary (Last 24 hours) at 10/17/2024 1055 Last data  filed at 10/17/2024 0846 Gross per 24 hour  Intake 760 ml  Output --  Net 760 ml   Filed Weights   10/15/24 0448 10/16/24 0500 10/17/24 0500  Weight: 73.2 kg 76.4 kg 76.4 kg    Examination:  General exam: Appears calm and comfortable  Respiratory system: Clear to auscultation. Respiratory effort normal. Cardiovascular system: S1 & S2 heard, RRR. No JVD, murmurs, rubs, gallops or clicks. No pedal edema. Gastrointestinal system: Abdomen is nondistended, soft and nontender. No organomegaly or masses felt. Normal bowel sounds heard. Central nervous system: Alert and oriented. No focal neurological deficits. Extremities: Symmetric 5 x 5 power. Skin: No rashes, lesions or ulcers   Data Reviewed: I have personally reviewed following labs and imaging studies  CBC: No results for input(s): WBC, NEUTROABS, HGB, HCT, MCV, PLT in the last 168 hours.  Basic Metabolic Panel: No results for input(s): NA, K, CL, CO2, GLUCOSE, BUN, CREATININE, CALCIUM, MG, PHOS in the last 168 hours.  GFR: Estimated Creatinine Clearance: 78.7 mL/min (by C-G formula based on SCr of 1.03 mg/dL). Liver Function Tests: No results for input(s): AST, ALT, ALKPHOS, BILITOT, PROT, ALBUMIN  in the last 168 hours. No results for input(s): LIPASE, AMYLASE in the last 168 hours. No results for input(s): AMMONIA in the last 168 hours. Coagulation Profile: No results for input(s): INR, PROTIME in the last 168 hours. Cardiac Enzymes: No results for input(s): CKTOTAL, CKMB, CKMBINDEX, TROPONINI in the last 168 hours. BNP (last 3 results) No results for input(s): PROBNP in the last 8760 hours. HbA1C: No results for input(s): HGBA1C in the last 72 hours. CBG: Recent Labs  Lab 10/11/24 1642  GLUCAP 120*   Lipid Profile: No results for input(s): CHOL, HDL, LDLCALC, TRIG, CHOLHDL, LDLDIRECT in the last 72 hours. Thyroid Function Tests: No  results for input(s): TSH, T4TOTAL, FREET4, T3FREE, THYROIDAB in the last 72 hours. Anemia Panel: No results for input(s): VITAMINB12, FOLATE, FERRITIN, TIBC, IRON, RETICCTPCT in the last 72 hours. Sepsis Labs: No results for input(s): PROCALCITON, LATICACIDVEN in the last 168 hours.  No results found for this or any previous visit (from the past 240 hours).   Radiology Studies: No results found.  Scheduled Meds:  benztropine   1 mg Oral BID   clonazePAM   0.5 mg Oral TID   docusate  100 mg Oral Daily   enoxaparin  (LOVENOX ) injection  40 mg Subcutaneous Daily   feeding supplement  237 mL Oral BID BM   ferrous sulfate   325 mg Oral Q breakfast   folic acid   1 mg Oral Daily   gabapentin   200 mg Oral TID   mirtazapine   15 mg Oral QHS   multivitamin with minerals  1 tablet Oral Daily   nicotine   14 mg Transdermal Daily   pantoprazole   40 mg Oral QHS   polyethylene glycol  17 g Oral Daily   propranolol   20 mg Oral BID   senna  2 tablet Oral Daily   sodium chloride  flush  10-40 mL Intracatheter Q12H   thiamine   100 mg Oral Daily   valbenazine   40 mg Oral Daily   Continuous Infusions:   LOS: 94 days   Fredia Skeeter, MD [facility]  10/17/2024, 10:55 AM   *Please note that this is a verbal dictation therefore any spelling or grammatical errors are due to the Dragon Medical One system interpretation.  Please page via Amion and do not message via secure chat for urgent patient care matters. Secure chat can be used for non urgent patient care matters.  How to contact the [facility] Attending or Consulting provider 7A - 7P or covering provider during after hours 7P -7A, for this patient?  Check  the care team in [facility] and look for a) attending/consulting [facility] provider listed and b) the [facility] team listed. Page or secure chat 7A-7P. Log into [email or web address] and use [address]'s universal password to access. If you do not have the password, please contact the hospital  operator. Locate the [facility] provider you are looking for under [facility] and page to a number that you can be directly reached. If you still have difficulty reaching the provider, please page the [facility] (Director on Call) for the Hospitalists listed on amion for assistance.  "

## 2024-10-17 NOTE — Plan of Care (Signed)
  Problem: Education: Goal: Knowledge of General Education information will improve Description: Including pain rating scale, medication(s)/side effects and non-pharmacologic comfort measures Outcome: Progressing   Problem: Health Behavior/Discharge Planning: Goal: Ability to manage health-related needs will improve Outcome: Progressing   Problem: Clinical Measurements: Goal: Ability to maintain clinical measurements within normal limits will improve Outcome: Progressing Goal: Will remain free from infection Outcome: Progressing Goal: Diagnostic test results will improve Outcome: Progressing Goal: Respiratory complications will improve Outcome: Progressing Goal: Cardiovascular complication will be avoided Outcome: Progressing   Problem: Activity: Goal: Risk for activity intolerance will decrease Outcome: Progressing   Problem: Nutrition: Goal: Adequate nutrition will be maintained Outcome: Progressing   Problem: Coping: Goal: Level of anxiety will decrease Outcome: Progressing   Problem: Elimination: Goal: Will not experience complications related to bowel motility Outcome: Progressing Goal: Will not experience complications related to urinary retention Outcome: Progressing   Problem: Pain Managment: Goal: General experience of comfort will improve and/or be controlled Outcome: Progressing   Problem: Safety: Goal: Ability to remain free from injury will improve Outcome: Progressing   Problem: Skin Integrity: Goal: Risk for impaired skin integrity will decrease Outcome: Progressing   Problem: Education: Goal: Knowledge of the prescribed therapeutic regimen will improve Outcome: Progressing   Problem: Clinical Measurements: Goal: Usual level of consciousness will be regained or maintained. Outcome: Progressing Goal: Neurologic status will improve Outcome: Progressing Goal: Ability to maintain intracranial pressure will improve Outcome: Progressing    Problem: Skin Integrity: Goal: Demonstration of wound healing without infection will improve Outcome: Progressing

## 2024-10-17 NOTE — Progress Notes (Signed)
 Occupational Therapy Treatment Patient Details Name: Ernest Romero MRN: [id number] DOB: 04-Apr-1966 Today's Date: 10/17/2024   History of present illness 59 y.o. male presenting 07/16/24 from [facility] where he was [facility]. 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 On CIWA protocol. Transferred to [facility] for further management of craniotomy and flap. 10/27 s/p cranioplasty with bone flap replacement. 10/28 Change in status thought to be related to Klonopin  administration 10/28, repeat CTH shows significant increase in size of mixed attenuation subdural fluid collection underlying the L tempoparietal craniotomy site, now measuring approximately 15 mm in thickness, with associated mild mass effect and slight rightward midline shift. OR on 10/30 for SDH evacuation. [facility] 11/7 with fluid collected under bone flap. 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 Worsening dysphagia 11/29; repeat CTH stable 12/1. Cortrak placed 12/3-12/8. PMH includes alcohol use disorder, psychiatric disorder, HTN.   OT comments  Patient received in supine and agreeable to OT treatment. Patient able to change paper scrub pants and donn shoes with supervision. Patient ambulated to bathroom and able to stand at toilet to urinate with supervision and performed hand hygiene. Patient performed mobility in hallway with supervision and able to navigate back to room. Patient performed HEP for jaw exercises before setup for lunch.  Discharge recommendations continue to be appropriate. Acute OT to continue to follow to address established goals to facilitate DC to next venue of care.        If plan is discharge home, recommend the following:  Supervision due to cognitive status;Direct supervision/assist for medications management;Assistance with cooking/housework;Direct supervision/assist for financial  management;Assist for transportation;Assistance with feeding;A little help with walking and/or transfers;Help with stairs or ramp for entrance;A little help with bathing/dressing/bathroom   Equipment Recommendations  None recommended by OT    Recommendations for Other Services      Precautions / Restrictions Precautions Precautions: Fall Recall of Precautions/Restrictions: Impaired Precaution/Restrictions Comments: SBP <160, helmet (pt to wear when OOB) Restrictions Weight Bearing Restrictions Per Provider Order: No       Mobility Bed Mobility Overal bed mobility: Modified Independent             General bed mobility comments: able to get to EOB without assitance, left on EOB with lunch at end of session    Transfers Overall transfer level: Needs assistance Equipment used: None Transfers: Sit to/from Stand Sit to Stand: Supervision           General transfer comment: supervision for safety     Balance Overall balance assessment: Needs assistance Sitting-balance support: No upper extremity supported, Feet supported Sitting balance-Leahy Scale: Good     Standing balance support: No upper extremity supported Standing balance-Leahy Scale: Good                             ADL either performed or assessed with clinical judgement   ADL Overall ADL's : Needs assistance/impaired Eating/Feeding: Set up;Sitting   Grooming: Supervision/safety;Standing               Lower Body Dressing: Supervision/safety;Sit to/from stand;Cueing for safety Lower Body Dressing Details (indicate cue type and reason): to change paper scrub pants and donn shoes Toilet Transfer: Supervision/safety;Ambulation   Toileting- Clothing Manipulation and Hygiene: Supervision/safety;Sit to/from stand Toileting - Clothing Manipulation Details (indicate cue type and reason): able to stand  to urinate with supervision for safety       General ADL Comments: able to follow poster  of task with one verbal cues to initiate    Extremity/Trunk Assessment              Vision       Perception     Praxis     Communication Communication Communication: No apparent difficulties   Cognition Arousal: Alert Behavior During Therapy: WFL for tasks assessed/performed Cognition: Cognition impaired     Awareness: Intellectual awareness intact, Online awareness impaired Memory impairment (select all impairments): Working civil service fast streamer, Short-term memory Attention impairment (select first level of impairment): Divided attention Executive functioning impairment (select all impairments): Organization, Sequencing, Reasoning, Problem solving OT - Cognition Comments: required cues to use poster to perform daily tasks               [employer] of Cognitive Functioning: Automatic, Appropriate: Minimal Assistance for Daily Living Skills [VII] Following commands: Impaired Following commands impaired: Follows multi-step commands with increased time      Cueing   Cueing Techniques: Verbal cues, Visual cues  Exercises Exercises: Other exercises Other Exercises Other Exercises: performed jaw exercises following HEP    Shoulder Instructions       General Comments performed mobility in hallway with patient able to locate room without cues    Pertinent Vitals/ Pain       Pain Assessment Pain Assessment: No/denies pain  Home Living                                          Prior Functioning/Environment              Frequency  Min 2X/week        Progress Toward Goals  OT Goals(current goals can now be found in the care plan section)  Progress towards OT goals: Progressing toward goals     Plan      Co-evaluation                 AM-PAC OT 6 Clicks Daily Activity     Outcome Measure   Help from another person eating meals?: None Help from another person taking care of personal grooming?: A Little Help from  another person toileting, which includes using toliet, bedpan, or urinal?: A Little Help from another person bathing (including washing, rinsing, drying)?: A Little Help from another person to put on and taking off regular upper body clothing?: A Little Help from another person to put on and taking off regular lower body clothing?: A Little 6 Click Score: 19    End of Session Equipment Utilized During Treatment: Gait belt;Other (comment) (helmet)  OT Visit Diagnosis: Other abnormalities of gait and mobility (R26.89);Muscle weakness (generalized) (M62.81);Other symptoms and signs involving the nervous system (R29.898);Cognitive communication deficit (R41.841);Other symptoms and signs involving cognitive function   Activity Tolerance Patient tolerated treatment well   Patient Left in bed;with call bell/phone within reach;with bed alarm set   Nurse Communication Mobility status        Time: 1247-1310 OT Time Calculation (min): 23 min  Charges:    Dick Laine, OTA Acute Rehabilitation Services  Office [phone]   Jeb LITTIE Laine 10/17/2024, 1:57 PM

## 2024-10-17 NOTE — Progress Notes (Signed)
 SLP Cancellation Note  Patient Details Name: Ernest Romero MRN: [id number] DOB: 09/27/1966   Cancelled treatment:       Reason Eval/Treat Not Completed: Patient declined, no reason specified  Attempted cognitive treatment per POC. Pt sleeping soundly upon arrival of SLP. Once awakened, pt declined treatment at this time, stating he wanted to keep sleeping. Will continue efforts.   Annastasia Haskins B. Dory, MSP, CCC-SLP Speech Language Pathologist Office: [phone]  Dory Caprice Daring 10/17/2024, 1:51 PM

## 2024-10-17 NOTE — Plan of Care (Signed)
   Problem: Education: Goal: Knowledge of General Education information will improve Description Including pain rating scale, medication(s)/side effects and non-pharmacologic comfort measures Outcome: Progressing

## 2024-10-18 NOTE — Progress Notes (Signed)
 Physical Therapy Treatment Patient Details Name: Ernest Romero MRN: [id number] DOB: 12-01-1965 Today's Date: 10/18/2024   History of Present Illness 59 y.o. male presenting 07/16/24 from [facility] where he was admitted 06/30/24 for [facility] in the jail. 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 On CIWA protocol. Transferred to [facility] for further management of craniotomy and flap. 10/27 s/p cranioplasty with bone flap replacement. 10/28 Change in status thought to be related to Klonopin  administration 10/28, repeat CTH shows significant increase in size of mixed attenuation subdural fluid collection underlying the L tempoparietal craniotomy site, now measuring approximately 15 mm in thickness, with associated mild mass effect and slight rightward midline shift. OR on 10/30 for SDH evacuation. [facility] 11/7 with fluid collected under bone flap. 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 Worsening dysphagia 11/29; repeat CTH stable 12/1. Cortrak placed 12/3-12/8. PMH includes alcohol use disorder, psychiatric disorder, HTN.    PT Comments  Pt received in supine and agreeable to session. Pt demonstrates good use of poster in room for set up to ambulate safely. Pt able to don helmet with cues, but no physical assist. Pt demonstrates instability with shoes on with pt reporting heaviness. Pt able to perform dynamic challenges with intermittent min A, but no overt LOB. Pt scored 15/24 on DGI, suggesting pt is at an increased risk of falls. Pt limited by fatigue this session. Pt continues to benefit from PT services to progress toward functional mobility goals.     If plan is discharge home, recommend the following: Assistance with cooking/housework;Direct supervision/assist for medications management;Direct supervision/assist for financial management;Assist for transportation;Help with stairs or ramp for  entrance;Supervision due to cognitive status;A little help with walking and/or transfers;A little help with bathing/dressing/bathroom   Can travel by private vehicle     Yes  Equipment Recommendations  None recommended by PT    Recommendations for Other Services       Precautions / Restrictions Precautions Precautions: Fall Recall of Precautions/Restrictions: Impaired Precaution/Restrictions Comments: SBP <160, helmet (pt to wear when OOB) Restrictions Weight Bearing Restrictions Per Provider Order: No     Mobility  Bed Mobility Overal bed mobility: Modified Independent                  Transfers Overall transfer level: Needs assistance Equipment used: None Transfers: Sit to/from Stand Sit to Stand: Contact guard assist           General transfer comment: STS from EOB with CGA and no assist for rise, but pt with LOB once standing requiring mod A to correct    Ambulation/Gait Ambulation/Gait assistance: Contact guard assist, Min assist Gait Distance (Feet): 250 Feet Assistive device: None Gait Pattern/deviations: Step-through pattern, Decreased stride length, Shuffle, Narrow base of support       General Gait Details: Pt with low, short steps despite cues. Pt reports due to shoes feeling heavy. Pt noted to hit feet together during swing through leading to instability requiring intermittent min A for balance.   Stairs Stairs: Yes Stairs assistance: Contact guard assist Stair Management: One rail Right Number of Stairs: 2 General stair comments: some instability with single UE support, but no LOB. Noted pt only placed toes on first step requiring cues to improve BOS   Wheelchair Mobility     Tilt Bed    Modified Rankin (Stroke Patients Only)       Balance Overall balance  assessment: Needs assistance Sitting-balance support: No upper extremity supported, Feet supported Sitting balance-Leahy Scale: Good     Standing balance support: No upper  extremity supported, During functional activity Standing balance-Leahy Scale: Fair Standing balance comment: Some instability with shoes on requiring intermittent min A for stability                 Standardized Balance Assessment Standardized Balance Assessment : Dynamic Gait Index   Dynamic Gait Index Level Surface: Mild Impairment Change in Gait Speed: Mild Impairment Gait with Horizontal Head Turns: Mild Impairment Gait with Vertical Head Turns: Mild Impairment Gait and Pivot Turn: Moderate Impairment Step Over Obstacle: Mild Impairment Step Around Obstacles: Mild Impairment Steps: Mild Impairment Total Score: 15      Communication Communication Communication: No apparent difficulties  Cognition Arousal: Alert Behavior During Therapy: WFL for tasks assessed/performed   PT - Cognitive impairments: Memory, Safety/Judgement, Attention                   Rancho Levels of Cognitive Functioning Rancho Los Amigos Scales of Cognitive Functioning: Automatic, Appropriate: Minimal Assistance for Daily Living Skills Rancho Los Amigos Scales of Cognitive Functioning: Automatic, Appropriate: Minimal Assistance for Daily Living Skills [VII]   Following commands: Impaired Following commands impaired: Follows multi-step commands with increased time    Cueing Cueing Techniques: Verbal cues, Visual cues  Exercises      General Comments        Pertinent Vitals/Pain Pain Assessment Pain Assessment: No/denies pain     PT Goals (current goals can now be found in the care plan section) Acute Rehab PT Goals Patient Stated Goal: rehab PT Goal Formulation: With patient Time For Goal Achievement: 10/28/24 Progress towards PT goals: Progressing toward goals    Frequency    Min 2X/week       AM-PAC PT 6 Clicks Mobility   Outcome Measure  Help needed turning from your back to your side while in a flat bed without using bedrails?: None Help needed moving from  lying on your back to sitting on the side of a flat bed without using bedrails?: None Help needed moving to and from a bed to a chair (including a wheelchair)?: A Little Help needed standing up from a chair using your arms (e.g., wheelchair or bedside chair)?: A Little Help needed to walk in hospital room?: A Little Help needed climbing 3-5 steps with a railing? : A Little 6 Click Score: 20    End of Session Equipment Utilized During Treatment: Gait belt;Other (comment) (helmet) Activity Tolerance: Patient tolerated treatment well;Patient limited by fatigue Patient left: in bed;with call bell/phone within reach;with nursing/sitter in room Nurse Communication: Mobility status PT Visit Diagnosis: Other abnormalities of gait and mobility (R26.89);Other symptoms and signs involving the nervous system (R29.898)     Time: 8870-8858 PT Time Calculation (min) (ACUTE ONLY): 12 min  Charges:    $Gait Training: 8-22 mins PT General Charges $$ ACUTE PT VISIT: 1 Visit                    Darryle George, PTA Acute Rehabilitation Services Secure Chat Preferred  [phone]    Darryle George 10/18/2024, 1:12 PM

## 2024-10-18 NOTE — Progress Notes (Signed)
 Patient nurse found $400.00 in patient's bed.  Cash was secured in a security envelope and taken to the security office. Envolope # [id number]

## 2024-10-18 NOTE — Progress Notes (Signed)
 " PROGRESS NOTE    Ernest Romero  FMW:[id number] DOB: 27-Nov-1965 DOA: 07/15/2024 PCP: [name]   Brief Narrative:  59 year old with past medical history significant for alcohol abuse, bipolar disorder, panic disorder presented secondary to being found down, he was found to have a large subdural hematoma, underwent left frontoparietal craniotomy with evacuation of the subdural hematoma and placement of a drain and subsequent removal.  Patient required further management of his craniotomy and flap which necessitated transfer from [place] to [facility] where neurosurgery performed the left side allograft cranioplasty.  Repeat CT head was significant with worsening fluid requiring left frontal burr hole for evacuation on 10/30.  Hospitalization further complicated by development of an infected bone flap with culture grew  MRSA.  Patient underwent left bone flap removal and was treated with IV antibiotics for 6 weeks per infectious disease recommendation he completed 2 weeks of oral vancomycin  to complete a total of 8 weeks of antibiotics on 1/6 He is currently ward of the state now through DSS.  Currently does not have any bed offers, difficult to place.    10/27 crani w/ flap replacement 10/29: off clevi, liberate SBP goal, Klonopin , lithium , Haldol  as needed discontinued.  Latuda  dose cut by half 10/30: repeat CT head with increased collection - going to OR this afternoon for evacuation 10/31: OR  for SDH evacuation.  Developed arm twitching.  Klonopin  restarted at half dose 11/1: More awake.  No complaint 11/2 transferred out of ICU. 11/4 cortrak removed 11/8 CT head showed fluid collection under bone flap, decision made to remove bone flap 11/10 to OR for L bone flap removal 11/11 repeat CT head stable 11/29: notice to have worsening Dysphagia.  12/1 repeat CT head: stable.  12/3: Cortrack placed for nutrition, sutures removed by neurosurgery.  Tube feeds  started. 12/4: Very sedated, psychiatry requested to readjust meds, reduced clonazepam  to0.5mg  BID, decreased valbenazine  to 40 mg daily 12/5: Noted oriented but jaw tremors worse.  Evaluated by neurology, recommended psych to continue to follow and adjust medications  Assessment & Plan:   Principal Problem:   Subdural hematoma (HCC) Active Problems:   Bipolar disorder (HCC)   Unable to make decisions about medical treatment due to impaired mental capacity   Alcohol withdrawal (HCC)   Essential hypertension   Protein-calorie malnutrition, severe   Acute metabolic encephalopathy   Infection of craniotomy plate  1-Recurrent subdural hematoma: - Patient underwent craniotomy and bur hole procedure per neurosurgery - Per neurosurgery patient could have reaccumulation of fluid.  No new neurological changes. Neurological stable.    MRSA infected bone flap - Infectious disease consulted and patient was treated with IV vancomycin  for 6 weeks completed 09/23/24. He was subsequently started on 2 weeks of doxycycline  which completed 1/6   Acute metabolic encephalopathy - Related to above, mental status is stable, he required intermittent safety sitter   Bipolar disorder: Continue Ingressa, cogentin .    Drug-induced parkinsonism / Akathisia: Likely related  to use of antipsychotics, EEG obtained and was negative. Continue propranolol , Klonopin , and Ingrezza    Dysphagia: -Patient required NG tube for tube feeding from 12/3 until 12/8 -On  dysphagia 3 diet and doing well.   HTN: Continue propranolol , amlodipine  and losartan .  Blood pressure controlled.   Sinus bradycardia: Complicated by propranolol , dose reduced hold the parameters   Severe malnutrition: Continue with supplement   Iron deficiency anemia: Continue with ferrous sulfate   Aspiration pneumonia: Completed IV Unasyn   Acute hypoxic respiratory  failure secondary to pneumonia. Resolved   Alcohol use disorder, alcohol  withdrawal Completed CIWA and phenobarbital  taper. Continue thiamine  folic acid  and multivitamin  DVT prophylaxis: enoxaparin  (LOVENOX ) injection 40 mg Start: 10/01/24 1400 Place TED hose Start: 08/12/24 1150 SCDs Start: 08/01/24 1745 Place and maintain sequential compression device Start: 07/29/24 1819   Code Status: Full Code  Family Communication:  None present at bedside.  Plan of care discussed with patient in length and he/she verbalized understanding and agreed with it.  Status is: Inpatient Remains inpatient appropriate because: Medically stable, pending placement   Estimated body mass index is 24.33 kg/m as calculated from the following:   Height as of this encounter: 5' 10 (1.778 m).   Weight as of this encounter: 76.9 kg.    Nutritional Assessment: Body mass index is 24.33 kg/m.SABRA Seen by dietician.  I agree with the assessment and plan as outlined below: Nutrition Status: Nutrition Problem: Severe Malnutrition Etiology: acute illness Signs/Symptoms: severe muscle depletion, moderate muscle depletion, percent weight loss Percent weight loss: 15 % (in 3 months) Interventions: Ensure Enlive (each supplement provides 350kcal and 20 grams of protein), MVI, Magic cup, Refer to RD note for recommendations  . Skin Assessment: I have examined the patient's skin and I agree with the wound assessment as performed by the wound care RN as outlined below:    Consultants:  Psychiatry, neurosurgery, neurology  Procedures:  Left-sided allograft cranioplasty Left frontal burr hole for evacuation of subdural hematoma Left bone flap removal Cortrak placement/removal  Antimicrobials:  Anti-infectives (From admission, onward)    Start     Dose/Rate Route Frequency Ordered Stop   09/24/24 1000  doxycycline  (VIBRA -TABS) tablet 100 mg  Status:  Discontinued        100 mg Oral Every 12 hours 09/21/24 1455 09/23/24 1142   09/24/24 1000  doxycycline  (VIBRA -TABS) tablet 100 mg         100 mg Oral Every 12 hours 09/23/24 1142 10/07/24 2003   09/13/24 1000  vancomycin  (VANCOCIN ) IVPB 1000 mg/200 mL premix        1,000 mg 200 mL/hr over 60 Minutes Intravenous Every 12 hours 09/13/24 0835 09/24/24 0559   09/02/24 1700  Ampicillin -Sulbactam (UNASYN ) 3 g in sodium chloride  0.9 % 100 mL IVPB        3 g 200 mL/hr over 30 Minutes Intravenous Every 6 hours 09/02/24 1611 09/07/24 1230   08/31/24 1000  vancomycin  (VANCOREADY) IVPB 750 mg/150 mL  Status:  Discontinued        750 mg 150 mL/hr over 60 Minutes Intravenous Every 12 hours 08/31/24 0015 09/13/24 0835   08/21/24 0800  vancomycin  (VANCOCIN ) IVPB 1000 mg/200 mL premix  Status:  Discontinued        1,000 mg 200 mL/hr over 60 Minutes Intravenous Every 12 hours 08/21/24 0720 08/31/24 0015   08/19/24 0600  vancomycin  (VANCOREADY) IVPB 1250 mg/250 mL  Status:  Discontinued        1,250 mg 166.7 mL/hr over 90 Minutes Intravenous Every 12 hours 08/18/24 1818 08/21/24 0547   08/18/24 1830  vancomycin  (VANCOREADY) IVPB 500 mg/100 mL        500 mg 100 mL/hr over 60 Minutes Intravenous  Once 08/18/24 1817 08/19/24 1642   08/16/24 1600  vancomycin  (VANCOREADY) IVPB 750 mg/150 mL  Status:  Discontinued        750 mg 150 mL/hr over 60 Minutes Intravenous Every 8 hours 08/16/24 0828 08/18/24 1818   08/14/24 0800  vancomycin  (VANCOCIN )  IVPB 1000 mg/200 mL premix  Status:  Discontinued        1,000 mg 200 mL/hr over 60 Minutes Intravenous Every 8 hours 08/14/24 0016 08/16/24 0828   08/14/24 0030  vancomycin  (VANCOCIN ) IVPB 1000 mg/200 mL premix        1,000 mg 200 mL/hr over 60 Minutes Intravenous NOW 08/14/24 0016 08/14/24 0135   08/13/24 0100  vancomycin  (VANCOREADY) IVPB 750 mg/150 mL  Status:  Discontinued        750 mg 150 mL/hr over 60 Minutes Intravenous Every 12 hours 08/12/24 1230 08/14/24 0007   08/12/24 1400  ceFAZolin  (ANCEF ) IVPB 2g/100 mL premix  Status:  Discontinued        2 g 200 mL/hr over 30 Minutes Intravenous  Every 8 hours 08/12/24 1149 08/12/24 1230   08/12/24 1400  cefTAZidime  (FORTAZ ) 2 g in sodium chloride  0.9 % 100 mL IVPB  Status:  Discontinued        2 g 200 mL/hr over 30 Minutes Intravenous Every 8 hours 08/12/24 1152 08/13/24 1109   08/12/24 1245  ceFAZolin  (ANCEF ) IVPB 2g/100 mL premix  Status:  Discontinued        2 g 200 mL/hr over 30 Minutes Intravenous  Once 08/12/24 1149 08/12/24 1449   08/12/24 1245  vancomycin  (VANCOREADY) IVPB 1250 mg/250 mL        1,250 mg 166.7 mL/hr over 90 Minutes Intravenous  Once 08/12/24 1152 08/12/24 1501   08/12/24 0957  vancomycin  (VANCOCIN ) powder  Status:  Discontinued          As needed 08/12/24 1008 08/12/24 1037   08/01/24 2200  ceFAZolin  (ANCEF ) IVPB 2g/100 mL premix        2 g 200 mL/hr over 30 Minutes Intravenous Every 8 hours 08/01/24 1703 08/02/24 1915   08/01/24 1530  vancomycin  (VANCOCIN ) powder  Status:  Discontinued          As needed 08/01/24 1559 08/01/24 1606   08/01/24 1421  ceFAZolin  (ANCEF ) 2-4 GM/100ML-% IVPB       Note to Pharmacy: Roslynn Birmingham: cabinet override      08/01/24 1421 08/02/24 0229   07/29/24 2200  ceFAZolin  (ANCEF ) IVPB 2g/100 mL premix        2 g 200 mL/hr over 30 Minutes Intravenous Every 8 hours 07/29/24 1859 07/30/24 1423   07/29/24 1654  vancomycin  (VANCOCIN ) powder  Status:  Discontinued          As needed 07/29/24 1655 07/29/24 1758   07/29/24 1515  ceFAZolin  (ANCEF ) IVPB 2g/100 mL premix        2 g 200 mL/hr over 30 Minutes Intravenous  Once 07/29/24 1421 07/29/24 1655         Subjective: Patient seen and examined.  Mostly sleepy oftentimes but arousable and has no complaints.  Status quo.  Objective: Vitals:   10/18/24 0058 10/18/24 0431 10/18/24 0545 10/18/24 0810  BP: 106/81 124/86  108/74  Pulse: (!) 57 (!) 52  (!) 55  Resp: 18 19    Temp: 98 F (36.7 C) 98.7 F (37.1 C)  98.1 F (36.7 C)  TempSrc: Oral Oral    SpO2: 97% 96%  96%  Weight:   76.9 kg   Height:       No intake or  output data in the 24 hours ending 10/18/24 1058  Filed Weights   10/16/24 0500 10/17/24 0500 10/18/24 0545  Weight: 76.4 kg 76.4 kg 76.9 kg    Examination:  General  exam: Appears calm and comfortable  Respiratory system: Clear to auscultation. Respiratory effort normal. Cardiovascular system: S1 & S2 heard, RRR. No JVD, murmurs, rubs, gallops or clicks. No pedal edema. Gastrointestinal system: Abdomen is nondistended, soft and nontender. No organomegaly or masses felt. Normal bowel sounds heard. Central nervous system: Alert and oriented. No focal neurological deficits. Extremities: Symmetric 5 x 5 power. Skin: No rashes, lesions or ulcers   Data Reviewed: I have personally reviewed following labs and imaging studies  CBC: No results for input(s): WBC, NEUTROABS, HGB, HCT, MCV, PLT in the last 168 hours.  Basic Metabolic Panel: No results for input(s): NA, K, CL, CO2, GLUCOSE, BUN, CREATININE, CALCIUM, MG, PHOS in the last 168 hours.  GFR: Estimated Creatinine Clearance: 78.7 mL/min (by C-G formula based on SCr of 1.03 mg/dL). Liver Function Tests: No results for input(s): AST, ALT, ALKPHOS, BILITOT, PROT, ALBUMIN  in the last 168 hours. No results for input(s): LIPASE, AMYLASE in the last 168 hours. No results for input(s): AMMONIA in the last 168 hours. Coagulation Profile: No results for input(s): INR, PROTIME in the last 168 hours. Cardiac Enzymes: No results for input(s): CKTOTAL, CKMB, CKMBINDEX, TROPONINI in the last 168 hours. BNP (last 3 results) No results for input(s): PROBNP in the last 8760 hours. HbA1C: No results for input(s): HGBA1C in the last 72 hours. CBG: Recent Labs  Lab 10/11/24 1642  GLUCAP 120*   Lipid Profile: No results for input(s): CHOL, HDL, LDLCALC, TRIG, CHOLHDL, LDLDIRECT in the last 72 hours. Thyroid Function Tests: No results for input(s): TSH, T4TOTAL,  FREET4, T3FREE, THYROIDAB in the last 72 hours. Anemia Panel: No results for input(s): VITAMINB12, FOLATE, FERRITIN, TIBC, IRON, RETICCTPCT in the last 72 hours. Sepsis Labs: No results for input(s): PROCALCITON, LATICACIDVEN in the last 168 hours.  No results found for this or any previous visit (from the past 240 hours).   Radiology Studies: No results found.  Scheduled Meds:  benztropine   1 mg Oral BID   clonazePAM   0.5 mg Oral TID   docusate  100 mg Oral Daily   enoxaparin  (LOVENOX ) injection  40 mg Subcutaneous Daily   feeding supplement  237 mL Oral BID BM   ferrous sulfate   325 mg Oral Q breakfast   folic acid   1 mg Oral Daily   gabapentin   200 mg Oral TID   mirtazapine   15 mg Oral QHS   multivitamin with minerals  1 tablet Oral Daily   nicotine   14 mg Transdermal Daily   pantoprazole   40 mg Oral QHS   polyethylene glycol  17 g Oral Daily   propranolol   20 mg Oral BID   senna  2 tablet Oral Daily   sodium chloride  flush  10-40 mL Intracatheter Q12H   thiamine   100 mg Oral Daily   valbenazine   40 mg Oral Daily   Continuous Infusions:   LOS: 95 days   Fredia Skeeter, MD [facility]  10/18/2024, 10:58 AM   *Please note that this is a verbal dictation therefore any spelling or grammatical errors are due to the Dragon Medical One system interpretation.  Please page via Amion and do not message via secure chat for urgent patient care matters. Secure chat can be used for non urgent patient care matters.  How to contact the [facility] Attending or Consulting provider 7A - 7P or covering provider during after hours 7P -7A, for this patient?  Check the care team in [facility] and look for a) attending/consulting [facility] provider listed  and b) the [facility] team listed. Page or secure chat 7A-7P. Log into [email or web address] and use [place]'s universal password to access. If you do not have the password, please contact the hospital operator. Locate the [facility] provider you  are looking for under [facility] and page to a number that you can be directly reached. If you still have difficulty reaching the provider, please page the [facility] (Director on Call) for the Hospitalists listed on amion for assistance.  "

## 2024-10-18 NOTE — Plan of Care (Signed)
  Problem: Education: Goal: Knowledge of General Education information will improve Description: Including pain rating scale, medication(s)/side effects and non-pharmacologic comfort measures Outcome: Progressing   Problem: Health Behavior/Discharge Planning: Goal: Ability to manage health-related needs will improve Outcome: Progressing   Problem: Clinical Measurements: Goal: Ability to maintain clinical measurements within normal limits will improve Outcome: Progressing Goal: Will remain free from infection Outcome: Progressing Goal: Diagnostic test results will improve Outcome: Progressing Goal: Respiratory complications will improve Outcome: Progressing Goal: Cardiovascular complication will be avoided Outcome: Progressing   Problem: Activity: Goal: Risk for activity intolerance will decrease Outcome: Progressing   Problem: Nutrition: Goal: Adequate nutrition will be maintained Outcome: Progressing   Problem: Coping: Goal: Level of anxiety will decrease Outcome: Progressing   Problem: Elimination: Goal: Will not experience complications related to bowel motility Outcome: Progressing Goal: Will not experience complications related to urinary retention Outcome: Progressing   Problem: Pain Managment: Goal: General experience of comfort will improve and/or be controlled Outcome: Progressing   Problem: Safety: Goal: Ability to remain free from injury will improve Outcome: Progressing   Problem: Skin Integrity: Goal: Risk for impaired skin integrity will decrease Outcome: Progressing   Problem: Education: Goal: Knowledge of the prescribed therapeutic regimen will improve Outcome: Progressing   Problem: Clinical Measurements: Goal: Usual level of consciousness will be regained or maintained. Outcome: Progressing Goal: Neurologic status will improve Outcome: Progressing Goal: Ability to maintain intracranial pressure will improve Outcome: Progressing    Problem: Skin Integrity: Goal: Demonstration of wound healing without infection will improve Outcome: Progressing

## 2024-10-19 NOTE — Plan of Care (Signed)
  Problem: Education: Goal: Knowledge of General Education information will improve Description: Including pain rating scale, medication(s)/side effects and non-pharmacologic comfort measures Outcome: Progressing   Problem: Health Behavior/Discharge Planning: Goal: Ability to manage health-related needs will improve Outcome: Progressing   Problem: Clinical Measurements: Goal: Ability to maintain clinical measurements within normal limits will improve Outcome: Progressing Goal: Will remain free from infection Outcome: Progressing Goal: Diagnostic test results will improve Outcome: Progressing Goal: Respiratory complications will improve Outcome: Progressing Goal: Cardiovascular complication will be avoided Outcome: Progressing   Problem: Activity: Goal: Risk for activity intolerance will decrease Outcome: Progressing   Problem: Nutrition: Goal: Adequate nutrition will be maintained Outcome: Progressing   Problem: Coping: Goal: Level of anxiety will decrease Outcome: Progressing   Problem: Elimination: Goal: Will not experience complications related to bowel motility Outcome: Progressing Goal: Will not experience complications related to urinary retention Outcome: Progressing   Problem: Pain Managment: Goal: General experience of comfort will improve and/or be controlled Outcome: Progressing   Problem: Safety: Goal: Ability to remain free from injury will improve Outcome: Progressing   Problem: Skin Integrity: Goal: Risk for impaired skin integrity will decrease Outcome: Progressing   Problem: Education: Goal: Knowledge of the prescribed therapeutic regimen will improve Outcome: Progressing   Problem: Clinical Measurements: Goal: Usual level of consciousness will be regained or maintained. Outcome: Progressing Goal: Neurologic status will improve Outcome: Progressing Goal: Ability to maintain intracranial pressure will improve Outcome: Progressing    Problem: Skin Integrity: Goal: Demonstration of wound healing without infection will improve Outcome: Progressing

## 2024-10-19 NOTE — Progress Notes (Signed)
 " PROGRESS NOTE    Ernest Romero  FMW:[id number] DOB: Jan 06, 1966 DOA: 07/15/2024 PCP: [name]   Brief Narrative:  59 year old with past medical history significant for alcohol abuse, bipolar disorder, panic disorder presented secondary to being found down, he was found to have a large subdural hematoma, underwent left frontoparietal craniotomy with evacuation of the subdural hematoma and placement of a drain and subsequent removal.  Patient required further management of his craniotomy and flap which necessitated transfer from [place] to [facility] where neurosurgery performed the left side allograft cranioplasty.  Repeat CT head was significant with worsening fluid requiring left frontal burr hole for evacuation on 10/30.  Hospitalization further complicated by development of an infected bone flap with culture grew  MRSA.  Patient underwent left bone flap removal and was treated with IV antibiotics for 6 weeks per infectious disease recommendation he completed 2 weeks of oral vancomycin  to complete a total of 8 weeks of antibiotics on 1/6 He is currently ward of the state now through DSS.  Currently does not have any bed offers, difficult to place.    10/27 crani w/ flap replacement 10/29: off clevi, liberate SBP goal, Klonopin , lithium , Haldol  as needed discontinued.  Latuda  dose cut by half 10/30: repeat CT head with increased collection - going to OR this afternoon for evacuation 10/31: OR  for [facility] evacuation.  Developed arm twitching.  Klonopin  restarted at half dose 11/1: More awake.  No complaint 11/2 transferred out of ICU. 11/4 cortrak removed 11/8 CT head showed fluid collection under bone flap, decision made to remove bone flap 11/10 to OR for L bone flap removal 11/11 repeat CT head stable 11/29: notice to have worsening Dysphagia.  12/1 repeat CT head: stable.  12/3: Cortrack placed for nutrition, sutures removed by neurosurgery.  Tube feeds  started. 12/4: Very sedated, psychiatry requested to readjust meds, reduced clonazepam  to0.5mg  BID, decreased valbenazine  to 40 mg daily 12/5: Noted oriented but jaw tremors worse.  Evaluated by neurology, recommended psych to continue to follow and adjust medications  Assessment & Plan:   Principal Problem:   Subdural hematoma (HCC) Active Problems:   Bipolar disorder (HCC)   Unable to make decisions about medical treatment due to impaired mental capacity   Alcohol withdrawal (HCC)   Essential hypertension   Protein-calorie malnutrition, severe   Acute metabolic encephalopathy   Infection of craniotomy plate  1-Recurrent subdural hematoma: - Patient underwent craniotomy and bur hole procedure per neurosurgery - Per neurosurgery patient could have reaccumulation of fluid.  No new neurological changes. Neurological stable.    MRSA infected bone flap - Infectious disease consulted and patient was treated with IV vancomycin  for 6 weeks completed 09/23/24. He was subsequently started on 2 weeks of doxycycline  which completed 1/6   Acute metabolic encephalopathy - Related to above, mental status is stable, he required intermittent safety sitter   Bipolar disorder: Continue Ingressa, cogentin .    Drug-induced parkinsonism / Akathisia: Likely related  to use of antipsychotics, EEG obtained and was negative. Continue propranolol , Klonopin , and Ingrezza    Dysphagia: -Patient required NG tube for tube feeding from 12/3 until 12/8 -On  dysphagia 3 diet and doing well.   HTN: Continue propranolol , amlodipine  and losartan .  Blood pressure controlled.   Sinus bradycardia: Complicated by propranolol , dose reduced hold the parameters   Severe malnutrition: Continue with supplement   Iron deficiency anemia: Continue with ferrous sulfate   Aspiration pneumonia: Completed IV Unasyn   Acute hypoxic respiratory  failure secondary to pneumonia. Resolved   Alcohol use disorder, alcohol  withdrawal Completed CIWA and phenobarbital  taper. Continue thiamine  folic acid  and multivitamin  DVT prophylaxis: enoxaparin  (LOVENOX ) injection 40 mg Start: 10/01/24 1400 Place TED hose Start: 08/12/24 1150 SCDs Start: 08/01/24 1745 Place and maintain sequential compression device Start: 07/29/24 1819   Code Status: Full Code  Family Communication:  None present at bedside.  Plan of care discussed with patient in length and he/she verbalized understanding and agreed with it.  Status is: Inpatient Remains inpatient appropriate because: Medically stable, pending placement   Estimated body mass index is 24.83 kg/m as calculated from the following:   Height as of this encounter: 5' 10 (1.778 m).   Weight as of this encounter: 78.5 kg.    Nutritional Assessment: Body mass index is 24.83 kg/m.SABRA Seen by dietician.  I agree with the assessment and plan as outlined below: Nutrition Status: Nutrition Problem: Severe Malnutrition Etiology: acute illness Signs/Symptoms: severe muscle depletion, moderate muscle depletion, percent weight loss Percent weight loss: 15 % (in 3 months) Interventions: Ensure Enlive (each supplement provides 350kcal and 20 grams of protein), MVI, Magic cup, Refer to RD note for recommendations  . Skin Assessment: I have examined the patient's skin and I agree with the wound assessment as performed by the wound care RN as outlined below:    Consultants:  Psychiatry, neurosurgery, neurology  Procedures:  Left-sided allograft cranioplasty Left frontal burr hole for evacuation of subdural hematoma Left bone flap removal Cortrak placement/removal  Antimicrobials:  Anti-infectives (From admission, onward)    Start     Dose/Rate Route Frequency Ordered Stop   09/24/24 1000  doxycycline  (VIBRA -TABS) tablet 100 mg  Status:  Discontinued        100 mg Oral Every 12 hours 09/21/24 1455 09/23/24 1142   09/24/24 1000  doxycycline  (VIBRA -TABS) tablet 100 mg         100 mg Oral Every 12 hours 09/23/24 1142 10/07/24 2003   09/13/24 1000  vancomycin  (VANCOCIN ) IVPB 1000 mg/200 mL premix        1,000 mg 200 mL/hr over 60 Minutes Intravenous Every 12 hours 09/13/24 0835 09/24/24 0559   09/02/24 1700  Ampicillin -Sulbactam (UNASYN ) 3 g in sodium chloride  0.9 % 100 mL IVPB        3 g 200 mL/hr over 30 Minutes Intravenous Every 6 hours 09/02/24 1611 09/07/24 1230   08/31/24 1000  vancomycin  (VANCOREADY) IVPB 750 mg/150 mL  Status:  Discontinued        750 mg 150 mL/hr over 60 Minutes Intravenous Every 12 hours 08/31/24 0015 09/13/24 0835   08/21/24 0800  vancomycin  (VANCOCIN ) IVPB 1000 mg/200 mL premix  Status:  Discontinued        1,000 mg 200 mL/hr over 60 Minutes Intravenous Every 12 hours 08/21/24 0720 08/31/24 0015   08/19/24 0600  vancomycin  (VANCOREADY) IVPB 1250 mg/250 mL  Status:  Discontinued        1,250 mg 166.7 mL/hr over 90 Minutes Intravenous Every 12 hours 08/18/24 1818 08/21/24 0547   08/18/24 1830  vancomycin  (VANCOREADY) IVPB 500 mg/100 mL        500 mg 100 mL/hr over 60 Minutes Intravenous  Once 08/18/24 1817 08/19/24 1642   08/16/24 1600  vancomycin  (VANCOREADY) IVPB 750 mg/150 mL  Status:  Discontinued        750 mg 150 mL/hr over 60 Minutes Intravenous Every 8 hours 08/16/24 0828 08/18/24 1818   08/14/24 0800  vancomycin  (VANCOCIN )  IVPB 1000 mg/200 mL premix  Status:  Discontinued        1,000 mg 200 mL/hr over 60 Minutes Intravenous Every 8 hours 08/14/24 0016 08/16/24 0828   08/14/24 0030  vancomycin  (VANCOCIN ) IVPB 1000 mg/200 mL premix        1,000 mg 200 mL/hr over 60 Minutes Intravenous NOW 08/14/24 0016 08/14/24 0135   08/13/24 0100  vancomycin  (VANCOREADY) IVPB 750 mg/150 mL  Status:  Discontinued        750 mg 150 mL/hr over 60 Minutes Intravenous Every 12 hours 08/12/24 1230 08/14/24 0007   08/12/24 1400  ceFAZolin  (ANCEF ) IVPB 2g/100 mL premix  Status:  Discontinued        2 g 200 mL/hr over 30 Minutes Intravenous  Every 8 hours 08/12/24 1149 08/12/24 1230   08/12/24 1400  cefTAZidime  (FORTAZ ) 2 g in sodium chloride  0.9 % 100 mL IVPB  Status:  Discontinued        2 g 200 mL/hr over 30 Minutes Intravenous Every 8 hours 08/12/24 1152 08/13/24 1109   08/12/24 1245  ceFAZolin  (ANCEF ) IVPB 2g/100 mL premix  Status:  Discontinued        2 g 200 mL/hr over 30 Minutes Intravenous  Once 08/12/24 1149 08/12/24 1449   08/12/24 1245  vancomycin  (VANCOREADY) IVPB 1250 mg/250 mL        1,250 mg 166.7 mL/hr over 90 Minutes Intravenous  Once 08/12/24 1152 08/12/24 1501   08/12/24 0957  vancomycin  (VANCOCIN ) powder  Status:  Discontinued          As needed 08/12/24 1008 08/12/24 1037   08/01/24 2200  ceFAZolin  (ANCEF ) IVPB 2g/100 mL premix        2 g 200 mL/hr over 30 Minutes Intravenous Every 8 hours 08/01/24 1703 08/02/24 1915   08/01/24 1530  vancomycin  (VANCOCIN ) powder  Status:  Discontinued          As needed 08/01/24 1559 08/01/24 1606   08/01/24 1421  ceFAZolin  (ANCEF ) 2-4 GM/100ML-% IVPB       Note to Pharmacy: Roslynn Birmingham: cabinet override      08/01/24 1421 08/02/24 0229   07/29/24 2200  ceFAZolin  (ANCEF ) IVPB 2g/100 mL premix        2 g 200 mL/hr over 30 Minutes Intravenous Every 8 hours 07/29/24 1859 07/30/24 1423   07/29/24 1654  vancomycin  (VANCOCIN ) powder  Status:  Discontinued          As needed 07/29/24 1655 07/29/24 1758   07/29/24 1515  ceFAZolin  (ANCEF ) IVPB 2g/100 mL premix        2 g 200 mL/hr over 30 Minutes Intravenous  Once 07/29/24 1421 07/29/24 1655         Subjective: Patient seen and examined.  For the first time today, I saw patient standing in his room and walking around with a helmet on.  Patient was fully alert and oriented and he had no complaints.  Objective: Vitals:   10/18/24 2122 10/18/24 2352 10/19/24 0346 10/19/24 0846  BP: 107/70 113/75 (!) 123/90   Pulse: (!) 56 60 60   Resp:  17 19   Temp:  97.7 F (36.5 C) 97.8 F (36.6 C)   TempSrc:  Axillary Oral    SpO2:  96% 96%   Weight:    78.5 kg  Height:    5' 10 (1.778 m)    Intake/Output Summary (Last 24 hours) at 10/19/2024 1103 Last data filed at 10/19/2024 0800 Gross per 24 hour  Intake 837 ml  Output --  Net 837 ml    Filed Weights   10/17/24 0500 10/18/24 0545 10/19/24 0846  Weight: 76.4 kg 76.9 kg 78.5 kg    Examination:  General exam: Appears calm and comfortable  Respiratory system: Clear to auscultation. Respiratory effort normal. Cardiovascular system: S1 & S2 heard, RRR. No JVD, murmurs, rubs, gallops or clicks. No pedal edema. Gastrointestinal system: Abdomen is nondistended, soft and nontender. No organomegaly or masses felt. Normal bowel sounds heard. Central nervous system: Alert and oriented. No focal neurological deficits. Extremities: Symmetric 5 x 5 power. Skin: No rashes, lesions or ulcers   Data Reviewed: I have personally reviewed following labs and imaging studies  CBC: No results for input(s): WBC, NEUTROABS, HGB, HCT, MCV, PLT in the last 168 hours.  Basic Metabolic Panel: No results for input(s): NA, K, CL, CO2, GLUCOSE, BUN, CREATININE, CALCIUM, MG, PHOS in the last 168 hours.  GFR: Estimated Creatinine Clearance: 78.7 mL/min (by C-G formula based on SCr of 1.03 mg/dL). Liver Function Tests: No results for input(s): AST, ALT, ALKPHOS, BILITOT, PROT, ALBUMIN  in the last 168 hours. No results for input(s): LIPASE, AMYLASE in the last 168 hours. No results for input(s): AMMONIA in the last 168 hours. Coagulation Profile: No results for input(s): INR, PROTIME in the last 168 hours. Cardiac Enzymes: No results for input(s): CKTOTAL, CKMB, CKMBINDEX, TROPONINI in the last 168 hours. BNP (last 3 results) No results for input(s): PROBNP in the last 8760 hours. HbA1C: No results for input(s): HGBA1C in the last 72 hours. CBG: No results for input(s): GLUCAP in the last 168  hours.  Lipid Profile: No results for input(s): CHOL, HDL, LDLCALC, TRIG, CHOLHDL, LDLDIRECT in the last 72 hours. Thyroid Function Tests: No results for input(s): TSH, T4TOTAL, FREET4, T3FREE, THYROIDAB in the last 72 hours. Anemia Panel: No results for input(s): VITAMINB12, FOLATE, FERRITIN, TIBC, IRON, RETICCTPCT in the last 72 hours. Sepsis Labs: No results for input(s): PROCALCITON, LATICACIDVEN in the last 168 hours.  No results found for this or any previous visit (from the past 240 hours).   Radiology Studies: No results found.  Scheduled Meds:  benztropine   1 mg Oral BID   clonazePAM   0.5 mg Oral TID   docusate  100 mg Oral Daily   enoxaparin  (LOVENOX ) injection  40 mg Subcutaneous Daily   feeding supplement  237 mL Oral BID BM   ferrous sulfate   325 mg Oral Q breakfast   folic acid   1 mg Oral Daily   gabapentin   200 mg Oral TID   mirtazapine   15 mg Oral QHS   multivitamin with minerals  1 tablet Oral Daily   nicotine   14 mg Transdermal Daily   pantoprazole   40 mg Oral QHS   polyethylene glycol  17 g Oral Daily   propranolol   20 mg Oral BID   senna  2 tablet Oral Daily   sodium chloride  flush  10-40 mL Intracatheter Q12H   thiamine   100 mg Oral Daily   valbenazine   40 mg Oral Daily   Continuous Infusions:   LOS: 96 days   Ernest Skeeter, MD [facility]  10/19/2024, 11:03 AM   *Please note that this is a verbal dictation therefore any spelling or grammatical errors are due to the Dragon [employer] One system interpretation.  Please page via Amion and do not message via secure chat for urgent patient care matters. Secure chat can be used for non urgent patient care matters.  How  to contact the [facility] Attending or Consulting provider 7A - 7P or covering provider during after hours 7P -7A, for this patient?  Check the care team in [facility] and look for a) attending/consulting [facility] provider listed and b) the [facility] team listed. Page  or secure chat 7A-7P. Log into [email or web address] and use [place]'s universal password to access. If you do not have the password, please contact the hospital operator. Locate the [facility] provider you are looking for under [facility] and page to a number that you can be directly reached. If you still have difficulty reaching the provider, please page the [facility] (Director on Call) for the Hospitalists listed on amion for assistance.  "

## 2024-10-20 LAB — GLUCOSE, CAPILLARY: Glucose-Capillary: 119 mg/dL — ABNORMAL HIGH (ref 70–99)

## 2024-10-20 NOTE — Progress Notes (Signed)
 " PROGRESS NOTE    YOEL KAUFHOLD  FMW:[id number] DOB: Dec 02, 1965 DOA: 07/15/2024 PCP: [name]   Brief Narrative:  59 year old with past medical history significant for alcohol abuse, bipolar disorder, panic disorder presented secondary to being found down, he was found to have a large subdural hematoma, underwent left frontoparietal craniotomy with evacuation of the subdural hematoma and placement of a drain and subsequent removal.  Patient required further management of his craniotomy and flap which necessitated transfer from [place] to [facility] where neurosurgery performed the left side allograft cranioplasty.  Repeat CT head was significant with worsening fluid requiring left frontal burr hole for evacuation on 10/30.  Hospitalization further complicated by development of an infected bone flap with culture grew  MRSA.  Patient underwent left bone flap removal and was treated with IV antibiotics for 6 weeks per infectious disease recommendation he completed 2 weeks of oral vancomycin  to complete a total of 8 weeks of antibiotics on 1/6 He is currently ward of the state now through DSS.  Currently does not have any bed offers, difficult to place.    10/27 crani w/ flap replacement 10/29: off clevi, liberate SBP goal, Klonopin , lithium , Haldol  as needed discontinued.  Latuda  dose cut by half 10/30: repeat CT head with increased collection - going to OR this afternoon for evacuation 10/31: OR  for [facility] evacuation.  Developed arm twitching.  Klonopin  restarted at half dose 11/1: More awake.  No complaint 11/2 transferred out of ICU. 11/4 cortrak removed 11/8 CT head showed fluid collection under bone flap, decision made to remove bone flap 11/10 to OR for L bone flap removal 11/11 repeat CT head stable 11/29: notice to have worsening Dysphagia.  12/1 repeat CT head: stable.  12/3: Cortrack placed for nutrition, sutures removed by neurosurgery.  Tube feeds  started. 12/4: Very sedated, psychiatry requested to readjust meds, reduced clonazepam  to0.5mg  BID, decreased valbenazine  to 40 mg daily 12/5: Noted oriented but jaw tremors worse.  Evaluated by neurology, recommended psych to continue to follow and adjust medications  Assessment & Plan:   Principal Problem:   Subdural hematoma (HCC) Active Problems:   Bipolar disorder (HCC)   Unable to make decisions about medical treatment due to impaired mental capacity   Alcohol withdrawal (HCC)   Essential hypertension   Protein-calorie malnutrition, severe   Acute metabolic encephalopathy   Infection of craniotomy plate  1-Recurrent subdural hematoma: - Patient underwent craniotomy and bur hole procedure per neurosurgery - Per neurosurgery patient could have reaccumulation of fluid.  No new neurological changes. Neurological stable.    MRSA infected bone flap - Infectious disease consulted and patient was treated with IV vancomycin  for 6 weeks completed 09/23/24. He was subsequently started on 2 weeks of doxycycline  which completed 1/6   Acute metabolic encephalopathy - Related to above, mental status is stable, he required intermittent safety sitter   Bipolar disorder: Continue Ingressa, cogentin .    Drug-induced parkinsonism / Akathisia: Likely related  to use of antipsychotics, EEG obtained and was negative. Continue propranolol , Klonopin , and Ingrezza    Dysphagia: -Patient required NG tube for tube feeding from 12/3 until 12/8 -On  dysphagia 3 diet and doing well.   HTN: Continue propranolol , amlodipine  and losartan .  Blood pressure controlled.   Sinus bradycardia: Complicated by propranolol , dose reduced hold the parameters   Severe malnutrition: Continue with supplement   Iron deficiency anemia: Continue with ferrous sulfate   Aspiration pneumonia: Completed IV Unasyn   Acute hypoxic respiratory  failure secondary to pneumonia. Resolved   Alcohol use disorder, alcohol  withdrawal Completed CIWA and phenobarbital  taper. Continue thiamine  folic acid  and multivitamin  DVT prophylaxis: enoxaparin  (LOVENOX ) injection 40 mg Start: 10/01/24 1400 Place TED hose Start: 08/12/24 1150 SCDs Start: 08/01/24 1745 Place and maintain sequential compression device Start: 07/29/24 1819   Code Status: Full Code  Family Communication:  None present at bedside.  Plan of care discussed with patient in length and he/she verbalized understanding and agreed with it.  Status is: Inpatient Remains inpatient appropriate because: Medically stable, pending placement   Estimated body mass index is 24.83 kg/m as calculated from the following:   Height as of this encounter: 5' 10 (1.778 m).   Weight as of this encounter: 78.5 kg.    Nutritional Assessment: Body mass index is 24.83 kg/m.SABRA Seen by dietician.  I agree with the assessment and plan as outlined below: Nutrition Status: Nutrition Problem: Severe Malnutrition Etiology: acute illness Signs/Symptoms: severe muscle depletion, moderate muscle depletion, percent weight loss Percent weight loss: 15 % (in 3 months) Interventions: Ensure Enlive (each supplement provides 350kcal and 20 grams of protein), MVI, Magic cup, Refer to RD note for recommendations  . Skin Assessment: I have examined the patient's skin and I agree with the wound assessment as performed by the wound care RN as outlined below:    Consultants:  Psychiatry, neurosurgery, neurology  Procedures:  Left-sided allograft cranioplasty Left frontal burr hole for evacuation of subdural hematoma Left bone flap removal Cortrak placement/removal  Antimicrobials:  Anti-infectives (From admission, onward)    Start     Dose/Rate Route Frequency Ordered Stop   09/24/24 1000  doxycycline  (VIBRA -TABS) tablet 100 mg  Status:  Discontinued        100 mg Oral Every 12 hours 09/21/24 1455 09/23/24 1142   09/24/24 1000  doxycycline  (VIBRA -TABS) tablet 100 mg         100 mg Oral Every 12 hours 09/23/24 1142 10/07/24 2003   09/13/24 1000  vancomycin  (VANCOCIN ) IVPB 1000 mg/200 mL premix        1,000 mg 200 mL/hr over 60 Minutes Intravenous Every 12 hours 09/13/24 0835 09/24/24 0559   09/02/24 1700  Ampicillin -Sulbactam (UNASYN ) 3 g in sodium chloride  0.9 % 100 mL IVPB        3 g 200 mL/hr over 30 Minutes Intravenous Every 6 hours 09/02/24 1611 09/07/24 1230   08/31/24 1000  vancomycin  (VANCOREADY) IVPB 750 mg/150 mL  Status:  Discontinued        750 mg 150 mL/hr over 60 Minutes Intravenous Every 12 hours 08/31/24 0015 09/13/24 0835   08/21/24 0800  vancomycin  (VANCOCIN ) IVPB 1000 mg/200 mL premix  Status:  Discontinued        1,000 mg 200 mL/hr over 60 Minutes Intravenous Every 12 hours 08/21/24 0720 08/31/24 0015   08/19/24 0600  vancomycin  (VANCOREADY) IVPB 1250 mg/250 mL  Status:  Discontinued        1,250 mg 166.7 mL/hr over 90 Minutes Intravenous Every 12 hours 08/18/24 1818 08/21/24 0547   08/18/24 1830  vancomycin  (VANCOREADY) IVPB 500 mg/100 mL        500 mg 100 mL/hr over 60 Minutes Intravenous  Once 08/18/24 1817 08/19/24 1642   08/16/24 1600  vancomycin  (VANCOREADY) IVPB 750 mg/150 mL  Status:  Discontinued        750 mg 150 mL/hr over 60 Minutes Intravenous Every 8 hours 08/16/24 0828 08/18/24 1818   08/14/24 0800  vancomycin  (VANCOCIN )  IVPB 1000 mg/200 mL premix  Status:  Discontinued        1,000 mg 200 mL/hr over 60 Minutes Intravenous Every 8 hours 08/14/24 0016 08/16/24 0828   08/14/24 0030  vancomycin  (VANCOCIN ) IVPB 1000 mg/200 mL premix        1,000 mg 200 mL/hr over 60 Minutes Intravenous NOW 08/14/24 0016 08/14/24 0135   08/13/24 0100  vancomycin  (VANCOREADY) IVPB 750 mg/150 mL  Status:  Discontinued        750 mg 150 mL/hr over 60 Minutes Intravenous Every 12 hours 08/12/24 1230 08/14/24 0007   08/12/24 1400  ceFAZolin  (ANCEF ) IVPB 2g/100 mL premix  Status:  Discontinued        2 g 200 mL/hr over 30 Minutes Intravenous  Every 8 hours 08/12/24 1149 08/12/24 1230   08/12/24 1400  cefTAZidime  (FORTAZ ) 2 g in sodium chloride  0.9 % 100 mL IVPB  Status:  Discontinued        2 g 200 mL/hr over 30 Minutes Intravenous Every 8 hours 08/12/24 1152 08/13/24 1109   08/12/24 1245  ceFAZolin  (ANCEF ) IVPB 2g/100 mL premix  Status:  Discontinued        2 g 200 mL/hr over 30 Minutes Intravenous  Once 08/12/24 1149 08/12/24 1449   08/12/24 1245  vancomycin  (VANCOREADY) IVPB 1250 mg/250 mL        1,250 mg 166.7 mL/hr over 90 Minutes Intravenous  Once 08/12/24 1152 08/12/24 1501   08/12/24 0957  vancomycin  (VANCOCIN ) powder  Status:  Discontinued          As needed 08/12/24 1008 08/12/24 1037   08/01/24 2200  ceFAZolin  (ANCEF ) IVPB 2g/100 mL premix        2 g 200 mL/hr over 30 Minutes Intravenous Every 8 hours 08/01/24 1703 08/02/24 1915   08/01/24 1530  vancomycin  (VANCOCIN ) powder  Status:  Discontinued          As needed 08/01/24 1559 08/01/24 1606   08/01/24 1421  ceFAZolin  (ANCEF ) 2-4 GM/100ML-% IVPB       Note to Pharmacy: Roslynn Birmingham: cabinet override      08/01/24 1421 08/02/24 0229   07/29/24 2200  ceFAZolin  (ANCEF ) IVPB 2g/100 mL premix        2 g 200 mL/hr over 30 Minutes Intravenous Every 8 hours 07/29/24 1859 07/30/24 1423   07/29/24 1654  vancomycin  (VANCOCIN ) powder  Status:  Discontinued          As needed 07/29/24 1655 07/29/24 1758   07/29/24 1515  ceFAZolin  (ANCEF ) IVPB 2g/100 mL premix        2 g 200 mL/hr over 30 Minutes Intravenous  Once 07/29/24 1421 07/29/24 1655         Subjective: Seen and examined.  Sleepy but arousable.  No complaints.  Objective: Vitals:   10/19/24 2003 10/19/24 2300 10/20/24 0500 10/20/24 0831  BP: 101/74 111/72 96/61 (!) 119/53  Pulse: (!) 59 61 (!) 57 (!) 58  Resp: 17 16 16    Temp: 97.9 F (36.6 C) 97.7 F (36.5 C) 98.4 F (36.9 C) (!) 97.4 F (36.3 C)  TempSrc: Oral     SpO2: 98% 97% 97% 96%  Weight:      Height:        Intake/Output Summary (Last 24  hours) at 10/20/2024 1005 Last data filed at 10/20/2024 0900 Gross per 24 hour  Intake 360 ml  Output --  Net 360 ml    Filed Weights   10/17/24 0500 10/18/24  0545 10/19/24 0846  Weight: 76.4 kg 76.9 kg 78.5 kg    Examination:  General exam: Appears calm and comfortable  Respiratory system: Clear to auscultation. Respiratory effort normal. Cardiovascular system: S1 & S2 heard, RRR. No JVD, murmurs, rubs, gallops or clicks. No pedal edema. Gastrointestinal system: Abdomen is nondistended, soft and nontender. No organomegaly or masses felt. Normal bowel sounds heard. Central nervous system: Alert and oriented. No focal neurological deficits. Extremities: Symmetric 5 x 5 power. Skin: No rashes, lesions or ulcers   Data Reviewed: I have personally reviewed following labs and imaging studies  CBC: No results for input(s): WBC, NEUTROABS, HGB, HCT, MCV, PLT in the last 168 hours.  Basic Metabolic Panel: No results for input(s): NA, K, CL, CO2, GLUCOSE, BUN, CREATININE, CALCIUM, MG, PHOS in the last 168 hours.  GFR: Estimated Creatinine Clearance: 78.7 mL/min (by C-G formula based on SCr of 1.03 mg/dL). Liver Function Tests: No results for input(s): AST, ALT, ALKPHOS, BILITOT, PROT, ALBUMIN  in the last 168 hours. No results for input(s): LIPASE, AMYLASE in the last 168 hours. No results for input(s): AMMONIA in the last 168 hours. Coagulation Profile: No results for input(s): INR, PROTIME in the last 168 hours. Cardiac Enzymes: No results for input(s): CKTOTAL, CKMB, CKMBINDEX, TROPONINI in the last 168 hours. BNP (last 3 results) No results for input(s): PROBNP in the last 8760 hours. HbA1C: No results for input(s): HGBA1C in the last 72 hours. CBG: No results for input(s): GLUCAP in the last 168 hours.  Lipid Profile: No results for input(s): CHOL, HDL, LDLCALC, TRIG, CHOLHDL, LDLDIRECT in the  last 72 hours. Thyroid Function Tests: No results for input(s): TSH, T4TOTAL, FREET4, T3FREE, THYROIDAB in the last 72 hours. Anemia Panel: No results for input(s): VITAMINB12, FOLATE, FERRITIN, TIBC, IRON, RETICCTPCT in the last 72 hours. Sepsis Labs: No results for input(s): PROCALCITON, LATICACIDVEN in the last 168 hours.  No results found for this or any previous visit (from the past 240 hours).   Radiology Studies: No results found.  Scheduled Meds:  benztropine   1 mg Oral BID   clonazePAM   0.5 mg Oral TID   docusate  100 mg Oral Daily   enoxaparin  (LOVENOX ) injection  40 mg Subcutaneous Daily   feeding supplement  237 mL Oral BID BM   ferrous sulfate   325 mg Oral Q breakfast   folic acid   1 mg Oral Daily   gabapentin   200 mg Oral TID   mirtazapine   15 mg Oral QHS   multivitamin with minerals  1 tablet Oral Daily   nicotine   14 mg Transdermal Daily   pantoprazole   40 mg Oral QHS   polyethylene glycol  17 g Oral Daily   propranolol   20 mg Oral BID   senna  2 tablet Oral Daily   sodium chloride  flush  10-40 mL Intracatheter Q12H   thiamine   100 mg Oral Daily   valbenazine   40 mg Oral Daily   Continuous Infusions:   LOS: 97 days   Fredia Skeeter, MD [facility]  10/20/2024, 10:05 AM   *Please note that this is a verbal dictation therefore any spelling or grammatical errors are due to the Dragon [employer] One system interpretation.  Please page via Amion and do not message via secure chat for urgent patient care matters. Secure chat can be used for non urgent patient care matters.  How to contact the [facility] Attending or Consulting provider 7A - 7P or covering provider during after hours 7P -7A, for  this patient?  Check the care team in [facility] and look for a) attending/consulting [facility] provider listed and b) the [facility] team listed. Page or secure chat 7A-7P. Log into [email or web address] and use [place]'s universal password to access. If you do not have  the password, please contact the hospital operator. Locate the [facility] provider you are looking for under [facility] and page to a number that you can be directly reached. If you still have difficulty reaching the provider, please page the [facility] (Director on Call) for the Hospitalists listed on amion for assistance.  "

## 2024-10-21 NOTE — Progress Notes (Signed)
 " PROGRESS NOTE    Ernest Romero  FMW:[id number] DOB: 1966/03/06 DOA: 07/15/2024 PCP: [name]   Brief Narrative:  59 year old with past medical history significant for alcohol abuse, bipolar disorder, panic disorder presented secondary to being found down, he was found to have a large subdural hematoma, underwent left frontoparietal craniotomy with evacuation of the subdural hematoma and placement of a drain and subsequent removal.  Patient required further management of his craniotomy and flap which necessitated transfer from [place] to [facility] where neurosurgery performed the left side allograft cranioplasty.  Repeat CT head was significant with worsening fluid requiring left frontal burr hole for evacuation on 10/30.  Hospitalization further complicated by development of an infected bone flap with culture grew  MRSA.  Patient underwent left bone flap removal and was treated with IV antibiotics for 6 weeks per infectious disease recommendation he completed 2 weeks of oral vancomycin  to complete a total of 8 weeks of antibiotics on 1/6 He is currently ward of the state now through DSS.  Currently does not have any bed offers, difficult to place.    10/27 crani w/ flap replacement 10/29: off clevi, liberate SBP goal, Klonopin , lithium , Haldol  as needed discontinued.  Latuda  dose cut by half 10/30: repeat CT head with increased collection - going to OR this afternoon for evacuation 10/31: OR  for SDH evacuation.  Developed arm twitching.  Klonopin  restarted at half dose 11/1: More awake.  No complaint 11/2 transferred out of ICU. 11/4 cortrak removed 11/8 CT head showed fluid collection under bone flap, decision made to remove bone flap 11/10 to OR for L bone flap removal 11/11 repeat CT head stable 11/29: notice to have worsening Dysphagia.  12/1 repeat CT head: stable.  12/3: Cortrack placed for nutrition, sutures removed by neurosurgery.  Tube feeds  started. 12/4: Very sedated, psychiatry requested to readjust meds, reduced clonazepam  to0.5mg  BID, decreased valbenazine  to 40 mg daily 12/5: Noted oriented but jaw tremors worse.  Evaluated by neurology, recommended psych to continue to follow and adjust medications  Assessment & Plan:   Principal Problem:   Subdural hematoma (HCC) Active Problems:   Bipolar disorder (HCC)   Unable to make decisions about medical treatment due to impaired mental capacity   Alcohol withdrawal (HCC)   Essential hypertension   Protein-calorie malnutrition, severe   Acute metabolic encephalopathy   Infection of craniotomy plate  1-Recurrent subdural hematoma: - Patient underwent craniotomy and bur hole procedure per neurosurgery - Per neurosurgery patient could have reaccumulation of fluid.  No new neurological changes. Neurological stable.    MRSA infected bone flap - Infectious disease consulted and patient was treated with IV vancomycin  for 6 weeks completed 09/23/24. He was subsequently started on 2 weeks of doxycycline  which completed 1/6   Acute metabolic encephalopathy - Related to above, mental status is stable, he required intermittent safety sitter   Bipolar disorder: Continue Ingressa, cogentin .    Drug-induced parkinsonism / Akathisia: Likely related  to use of antipsychotics, EEG obtained and was negative. Continue propranolol , Klonopin , and Ingrezza    Dysphagia: -Patient required NG tube for tube feeding from 12/3 until 12/8 -On  dysphagia 3 diet and doing well.   HTN: Continue propranolol , amlodipine  and losartan .  Blood pressure controlled.   Sinus bradycardia: Complicated by propranolol , dose reduced hold the parameters   Severe malnutrition: Continue with supplement   Iron deficiency anemia: Continue with ferrous sulfate   Aspiration pneumonia: Completed IV Unasyn   Acute hypoxic respiratory  failure secondary to pneumonia. Resolved   Alcohol use disorder, alcohol  withdrawal Completed CIWA and phenobarbital  taper. Continue thiamine  folic acid  and multivitamin  DVT prophylaxis: enoxaparin  (LOVENOX ) injection 40 mg Start: 10/01/24 1400 Place TED hose Start: 08/12/24 1150 SCDs Start: 08/01/24 1745 Place and maintain sequential compression device Start: 07/29/24 1819   Code Status: Full Code  Family Communication:  None present at bedside.  Plan of care discussed with patient in length and he/she verbalized understanding and agreed with it.  Status is: Inpatient Remains inpatient appropriate because: Medically stable, pending placement   Estimated body mass index is 24.67 kg/m as calculated from the following:   Height as of this encounter: 5' 10 (1.778 m).   Weight as of this encounter: 78 kg.    Nutritional Assessment: Body mass index is 24.67 kg/m.SABRA Seen by dietician.  I agree with the assessment and plan as outlined below: Nutrition Status: Nutrition Problem: Severe Malnutrition Etiology: acute illness Signs/Symptoms: severe muscle depletion, moderate muscle depletion, percent weight loss Percent weight loss: 15 % (in 3 months) Interventions: Ensure Enlive (each supplement provides 350kcal and 20 grams of protein), MVI, Magic cup, Refer to RD note for recommendations  . Skin Assessment: I have examined the patient's skin and I agree with the wound assessment as performed by the wound care RN as outlined below:    Consultants:  Psychiatry, neurosurgery, neurology  Procedures:  Left-sided allograft cranioplasty Left frontal burr hole for evacuation of subdural hematoma Left bone flap removal Cortrak placement/removal  Antimicrobials:  Anti-infectives (From admission, onward)    Start     Dose/Rate Route Frequency Ordered Stop   09/24/24 1000  doxycycline  (VIBRA -TABS) tablet 100 mg  Status:  Discontinued        100 mg Oral Every 12 hours 09/21/24 1455 09/23/24 1142   09/24/24 1000  doxycycline  (VIBRA -TABS) tablet 100 mg         100 mg Oral Every 12 hours 09/23/24 1142 10/07/24 2003   09/13/24 1000  vancomycin  (VANCOCIN ) IVPB 1000 mg/200 mL premix        1,000 mg 200 mL/hr over 60 Minutes Intravenous Every 12 hours 09/13/24 0835 09/24/24 0559   09/02/24 1700  Ampicillin -Sulbactam (UNASYN ) 3 g in sodium chloride  0.9 % 100 mL IVPB        3 g 200 mL/hr over 30 Minutes Intravenous Every 6 hours 09/02/24 1611 09/07/24 1230   08/31/24 1000  vancomycin  (VANCOREADY) IVPB 750 mg/150 mL  Status:  Discontinued        750 mg 150 mL/hr over 60 Minutes Intravenous Every 12 hours 08/31/24 0015 09/13/24 0835   08/21/24 0800  vancomycin  (VANCOCIN ) IVPB 1000 mg/200 mL premix  Status:  Discontinued        1,000 mg 200 mL/hr over 60 Minutes Intravenous Every 12 hours 08/21/24 0720 08/31/24 0015   08/19/24 0600  vancomycin  (VANCOREADY) IVPB 1250 mg/250 mL  Status:  Discontinued        1,250 mg 166.7 mL/hr over 90 Minutes Intravenous Every 12 hours 08/18/24 1818 08/21/24 0547   08/18/24 1830  vancomycin  (VANCOREADY) IVPB 500 mg/100 mL        500 mg 100 mL/hr over 60 Minutes Intravenous  Once 08/18/24 1817 08/19/24 1642   08/16/24 1600  vancomycin  (VANCOREADY) IVPB 750 mg/150 mL  Status:  Discontinued        750 mg 150 mL/hr over 60 Minutes Intravenous Every 8 hours 08/16/24 0828 08/18/24 1818   08/14/24 0800  vancomycin  (VANCOCIN )  IVPB 1000 mg/200 mL premix  Status:  Discontinued        1,000 mg 200 mL/hr over 60 Minutes Intravenous Every 8 hours 08/14/24 0016 08/16/24 0828   08/14/24 0030  vancomycin  (VANCOCIN ) IVPB 1000 mg/200 mL premix        1,000 mg 200 mL/hr over 60 Minutes Intravenous NOW 08/14/24 0016 08/14/24 0135   08/13/24 0100  vancomycin  (VANCOREADY) IVPB 750 mg/150 mL  Status:  Discontinued        750 mg 150 mL/hr over 60 Minutes Intravenous Every 12 hours 08/12/24 1230 08/14/24 0007   08/12/24 1400  ceFAZolin  (ANCEF ) IVPB 2g/100 mL premix  Status:  Discontinued        2 g 200 mL/hr over 30 Minutes Intravenous  Every 8 hours 08/12/24 1149 08/12/24 1230   08/12/24 1400  cefTAZidime  (FORTAZ ) 2 g in sodium chloride  0.9 % 100 mL IVPB  Status:  Discontinued        2 g 200 mL/hr over 30 Minutes Intravenous Every 8 hours 08/12/24 1152 08/13/24 1109   08/12/24 1245  ceFAZolin  (ANCEF ) IVPB 2g/100 mL premix  Status:  Discontinued        2 g 200 mL/hr over 30 Minutes Intravenous  Once 08/12/24 1149 08/12/24 1449   08/12/24 1245  vancomycin  (VANCOREADY) IVPB 1250 mg/250 mL        1,250 mg 166.7 mL/hr over 90 Minutes Intravenous  Once 08/12/24 1152 08/12/24 1501   08/12/24 0957  vancomycin  (VANCOCIN ) powder  Status:  Discontinued          As needed 08/12/24 1008 08/12/24 1037   08/01/24 2200  ceFAZolin  (ANCEF ) IVPB 2g/100 mL premix        2 g 200 mL/hr over 30 Minutes Intravenous Every 8 hours 08/01/24 1703 08/02/24 1915   08/01/24 1530  vancomycin  (VANCOCIN ) powder  Status:  Discontinued          As needed 08/01/24 1559 08/01/24 1606   08/01/24 1421  ceFAZolin  (ANCEF ) 2-4 GM/100ML-% IVPB       Note to Pharmacy: Roslynn Birmingham: cabinet override      08/01/24 1421 08/02/24 0229   07/29/24 2200  ceFAZolin  (ANCEF ) IVPB 2g/100 mL premix        2 g 200 mL/hr over 30 Minutes Intravenous Every 8 hours 07/29/24 1859 07/30/24 1423   07/29/24 1654  vancomycin  (VANCOCIN ) powder  Status:  Discontinued          As needed 07/29/24 1655 07/29/24 1758   07/29/24 1515  ceFAZolin  (ANCEF ) IVPB 2g/100 mL premix        2 g 200 mL/hr over 30 Minutes Intravenous  Once 07/29/24 1421 07/29/24 1655         Subjective: Patient seen and examined.  Again, sleepy but arousable.  Alert and oriented when awake.  No complaints.  Objective: Vitals:   10/20/24 2001 10/21/24 0500 10/21/24 0517 10/21/24 0827  BP: 124/88  118/79 114/74  Pulse: 65  (!) 54 66  Resp: 17  19   Temp: 97.7 F (36.5 C)   98.3 F (36.8 C)  TempSrc:      SpO2: 97%  97% 97%  Weight:  78 kg    Height:        Intake/Output Summary (Last 24 hours) at  10/21/2024 0924 Last data filed at 10/20/2024 1300 Gross per 24 hour  Intake 240 ml  Output --  Net 240 ml    Filed Weights   10/18/24 0545 10/19/24 9153  10/21/24 0500  Weight: 76.9 kg 78.5 kg 78 kg    Examination:  General exam: Appears calm and comfortable  Respiratory system: Clear to auscultation. Respiratory effort normal. Cardiovascular system: S1 & S2 heard, RRR. No JVD, murmurs, rubs, gallops or clicks. No pedal edema. Gastrointestinal system: Abdomen is nondistended, soft and nontender. No organomegaly or masses felt. Normal bowel sounds heard. Central nervous system: Alert and oriented. No focal neurological deficits. Extremities: Symmetric 5 x 5 power. Skin: No rashes, lesions or ulcers   Data Reviewed: I have personally reviewed following labs and imaging studies  CBC: No results for input(s): WBC, NEUTROABS, HGB, HCT, MCV, PLT in the last 168 hours.  Basic Metabolic Panel: No results for input(s): NA, K, CL, CO2, GLUCOSE, BUN, CREATININE, CALCIUM, MG, PHOS in the last 168 hours.  GFR: Estimated Creatinine Clearance: 78.7 mL/min (by C-G formula based on SCr of 1.03 mg/dL). Liver Function Tests: No results for input(s): AST, ALT, ALKPHOS, BILITOT, PROT, ALBUMIN  in the last 168 hours. No results for input(s): LIPASE, AMYLASE in the last 168 hours. No results for input(s): AMMONIA in the last 168 hours. Coagulation Profile: No results for input(s): INR, PROTIME in the last 168 hours. Cardiac Enzymes: No results for input(s): CKTOTAL, CKMB, CKMBINDEX, TROPONINI in the last 168 hours. BNP (last 3 results) No results for input(s): PROBNP in the last 8760 hours. HbA1C: No results for input(s): HGBA1C in the last 72 hours. CBG: Recent Labs  Lab 10/20/24 2002  GLUCAP 119*    Lipid Profile: No results for input(s): CHOL, HDL, LDLCALC, TRIG, CHOLHDL, LDLDIRECT in the last 72  hours. Thyroid Function Tests: No results for input(s): TSH, T4TOTAL, FREET4, T3FREE, THYROIDAB in the last 72 hours. Anemia Panel: No results for input(s): VITAMINB12, FOLATE, FERRITIN, TIBC, IRON, RETICCTPCT in the last 72 hours. Sepsis Labs: No results for input(s): PROCALCITON, LATICACIDVEN in the last 168 hours.  No results found for this or any previous visit (from the past 240 hours).   Radiology Studies: No results found.  Scheduled Meds:  benztropine   1 mg Oral BID   clonazePAM   0.5 mg Oral TID   docusate  100 mg Oral Daily   enoxaparin  (LOVENOX ) injection  40 mg Subcutaneous Daily   feeding supplement  237 mL Oral BID BM   ferrous sulfate   325 mg Oral Q breakfast   folic acid   1 mg Oral Daily   gabapentin   200 mg Oral TID   mirtazapine   15 mg Oral QHS   multivitamin with minerals  1 tablet Oral Daily   nicotine   14 mg Transdermal Daily   pantoprazole   40 mg Oral QHS   polyethylene glycol  17 g Oral Daily   propranolol   20 mg Oral BID   senna  2 tablet Oral Daily   sodium chloride  flush  10-40 mL Intracatheter Q12H   thiamine   100 mg Oral Daily   valbenazine   40 mg Oral Daily   Continuous Infusions:   LOS: 98 days   Fredia Skeeter, MD [facility]  10/21/2024, 9:24 AM   *Please note that this is a verbal dictation therefore any spelling or grammatical errors are due to the Dragon Medical One system interpretation.  Please page via Amion and do not message via secure chat for urgent patient care matters. Secure chat can be used for non urgent patient care matters.  How to contact the [facility] Attending or Consulting provider 7A - 7P or covering provider during after hours 7P -7A, for  this patient?  Check the care team in [facility] and look for a) attending/consulting [facility] provider listed and b) the [facility] team listed. Page or secure chat 7A-7P. Log into [email or web address] and use [place]'s universal password to access. If you do not have the  password, please contact the hospital operator. Locate the [facility] provider you are looking for under [facility] and page to a number that you can be directly reached. If you still have difficulty reaching the provider, please page the [facility] (Director on Call) for the Hospitalists listed on amion for assistance.  "

## 2024-10-21 NOTE — Progress Notes (Signed)
 Speech Language Pathology Treatment: Cognitive-Linguistic  Patient Details Name: Ernest Romero MRN: [id number] DOB: [birth year] Today's Date: 10/21/2024 Time: 8579-8554 SLP Time Calculation (min) (ACUTE ONLY): 25 min  Assessment / Plan / Recommendation Clinical Impression  Ernest Romero is demonstrating improved awareness - he volunteered today that he recognizes he was jumping out of bed too often, setting off alarms, and worrying staff.  He sat at EOB, cued to read his sign, then reached for soft helmet, putting it on backwards then self-correcting. We discussed his impulsive eating, which leads to coughing, a lot of spillage, and creates alarm for staff that he is going to choke or aspirate. We discussed changes he can make with eating - slowing down, closing his lips, recognizing when ice cream has spilled down his chin.  He fed himself crackers and peanut butter with improved self-pacing and better awareness. He is remembering to call and order his meals and verbalized overall that he feels his memory is improving.  SLP will continue to follow and update goals next session.    HPI HPI: Ernest Romero is a 59 y.o. male who was admitted to [facility] 06/30/24 after an un[facility] episode in jail. Dx large SDH; underwent left frontoparietal craniotomy with evacuation of SDH and drain placement 9/28 (drain removed 9/29). Transferred to [facility] for management of crani/flap 10/14. Cranioplasty with bone flap replacement 10/27; worsening neuro with midline shift; underwent  left burr hole for evacuation 10/30. Bone flap removal on 11/10.  Bedside swallow eval 10/28 after change in mentation and acute change in swallowing; NPO, then advanced to dysphagia 3/thin liquids 10/31.  New orders 11/18 due to coughing with POs - rec continued Dys3/thin liquids. Repeat orders 11/28 due to acute change again in swallow. MBS completed on 11/29 - severe oropharyngeal dysphagia with aspiration of all liquids and puree solids; rec NPO. Repeat  MBS 12/4- no significant change. Cortrak placed. [facility] 12/1 with evolving postoperative changes from left sided hemicraniectomy and bone flap removal without evidence of an acute intracranial abnormality or significant residual subdural collection. MBS 12/8 rec Dys3/thin with L headturn. New BSE ordered 10/12/24; SLP contacted RN who indicated she was mainly concerned with how pills should be given.      SLP Plan  Continue with current plan of care                             Continue with current plan of care   Alohilani Levenhagen L. Vona, MA CCC/SLP Clinical Specialist - Acute Care SLP Acute Rehabilitation Services Office number [phone]   [name]  10/21/2024, 3:16 PM

## 2024-10-21 NOTE — Progress Notes (Signed)
 Physical Therapy Treatment Patient Details Name: Ernest Romero MRN: [id number] DOB: 12/22/1965 Today's Date: 10/21/2024   History of Present Illness 59 y.o. male presenting 07/16/24 from [facility] where he was admitted 06/30/24 for unresponsive episode in the jail. 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 On CIWA protocol. Transferred to [facility] for further management of craniotomy and flap. 10/27 s/p cranioplasty with bone flap replacement. 10/28 Change in status thought to be related to Klonopin  administration 10/28, repeat CTH shows significant increase in size of mixed attenuation subdural fluid collection underlying the L tempoparietal craniotomy site, now measuring approximately 15 mm in thickness, with associated mild mass effect and slight rightward midline shift. OR on 10/30 for SDH evacuation. [facility] 11/7 with fluid collected under bone flap. 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 Worsening dysphagia 11/29; repeat CTH stable 12/1. Cortrak placed 12/3-12/8. PMH includes alcohol use disorder, psychiatric disorder, HTN.    PT Comments  Pt limited session today due to reporting he was feeling down and didn't want to work on high level balance activities. Pt continues to improve gait with supervision to CGA with no LOB without an AD. Pt is Mod I for bed mobility and supervision for sit to stand. Due to pt current functional status, home set up and available assistance at home recommending skilled physical therapy services 3x/week in order to address strength, balance and functional mobility to decrease risk for falls, injury and re-hospitalization.       If plan is discharge home, recommend the following: Assistance with cooking/housework;Direct supervision/assist for medications management;Direct supervision/assist for financial management;Assist for transportation;Help with stairs or ramp for  entrance;Supervision due to cognitive status;A little help with walking and/or transfers;A little help with bathing/dressing/bathroom   Can travel by private vehicle     Yes  Equipment Recommendations  None recommended by PT       Precautions / Restrictions Precautions Precautions: Fall Recall of Precautions/Restrictions: Impaired Precaution/Restrictions Comments: SBP <160, helmet (pt to wear when OOB) Restrictions Weight Bearing Restrictions Per Provider Order: No     Mobility  Bed Mobility Overal bed mobility: Modified Independent Bed Mobility: Supine to Sit, Sit to Supine                Transfers Overall transfer level: Needs assistance Equipment used: None Transfers: Sit to/from Stand Sit to Stand: Supervision           General transfer comment: supervision for safety, initially pt leaning back but stated he was stretching, no LOB    Ambulation/Gait Ambulation/Gait assistance: Contact guard assist, Supervision Gait Distance (Feet): 300 Feet Assistive device: None Gait Pattern/deviations: Step-through pattern, Decreased stride length Gait velocity: decreased; up to WNL Gait velocity interpretation: 1.31 - 2.62 ft/sec, indicative of limited community ambulator   General Gait Details: continues with little to no swing or trunk rotation without cues though slightly improved with balance during gait.      Balance Overall balance assessment: Needs assistance Sitting-balance support: No upper extremity supported, Feet supported Sitting balance-Leahy Scale: Good     Standing balance support: No upper extremity supported, During functional activity Standing balance-Leahy Scale: Fair        Hotel Manager: No apparent difficulties Factors Affecting Communication: Difficulty expressing self;Reduced clarity of speech  Cognition Arousal: Alert Behavior During Therapy: WFL for tasks assessed/performed   PT - Cognitive  impairments: Memory, Safety/Judgement, Attention   Rancho Levels of  Cognitive Functioning [place] Mirant Scales of Cognitive Functioning: Automatic, Appropriate: Minimal Assistance for Daily Living Skills [place] Mirant Scales of Cognitive Functioning: Automatic, Appropriate: Minimal Assistance for Daily Living Skills [VII] PT - Cognition Comments: pt states he doesn't have physical pain he has mental pain becuase he is tired of being at the hospital and he wants to leave and buy a car and be ind and help people and go to church. Pt declined referral to chaplain services. Following commands: Impaired Following commands impaired: Follows multi-step commands with increased time    Cueing Cueing Techniques: Verbal cues, Visual cues     General Comments General comments (skin integrity, edema, etc.): pt declined higher level balance activities today due to feeling down      Pertinent Vitals/Pain Pain Assessment Pain Assessment: No/denies pain Pain Intervention(s): Monitored during session     PT Goals (current goals can now be found in the care plan section) Acute Rehab PT Goals Patient Stated Goal: rehab PT Goal Formulation: With patient Time For Goal Achievement: 10/28/24 Potential to Achieve Goals: Fair Additional Goals Additional Goal #1: Patient will demonstrate decreased fall risk with Berg score at least 48/56 (was 43/56 on 10/02/24) Progress towards PT goals: Progressing toward goals    Frequency    Min 2X/week      PT Plan  Updated discharge recommendations       AM-PAC PT 6 Clicks Mobility   Outcome Measure  Help needed turning from your back to your side while in a flat bed without using bedrails?: None Help needed moving from lying on your back to sitting on the side of a flat bed without using bedrails?: None Help needed moving to and from a bed to a chair (including a wheelchair)?: A Little Help needed standing up from a chair using your arms (e.g.,  wheelchair or bedside chair)?: A Little Help needed to walk in hospital room?: A Little Help needed climbing 3-5 steps with a railing? : A Little 6 Click Score: 20    End of Session Equipment Utilized During Treatment: Gait belt;Other (comment) (helmet) Activity Tolerance: Patient tolerated treatment well;Patient limited by fatigue Patient left: in bed;with call bell/phone within reach;with nursing/sitter in room Nurse Communication: Mobility status PT Visit Diagnosis: Other abnormalities of gait and mobility (R26.89);Other symptoms and signs involving the nervous system (R29.898)     Time: 8475-8463 PT Time Calculation (min) (ACUTE ONLY): 12 min  Charges:    $Therapeutic Activity: 8-22 mins PT General Charges $$ ACUTE PT VISIT: 1 Visit                     Dorothyann Maier, DPT, CLT  Acute Rehabilitation Services Office: [phone] (Secure chat preferred)    Dorothyann VEAR Maier 10/21/2024, 4:57 PM

## 2024-10-21 NOTE — Plan of Care (Signed)
   Problem: Education: Goal: Knowledge of General Education information will improve Description Including pain rating scale, medication(s)/side effects and non-pharmacologic comfort measures Outcome: Progressing

## 2024-10-22 ENCOUNTER — Inpatient Hospital Stay: Payer: MEDICAID | Admitting: Internal Medicine

## 2024-10-22 NOTE — Progress Notes (Signed)
 " PROGRESS NOTE    Ernest Romero  FMW:[id number] DOB: 1965-12-26 DOA: 07/15/2024 PCP: [name]   Brief Narrative:  59 year old with past medical history significant for alcohol abuse, bipolar disorder, panic disorder presented secondary to being found down, he was found to have a large subdural hematoma, underwent left frontoparietal craniotomy with evacuation of the subdural hematoma and placement of a drain and subsequent removal.  Patient required further management of his craniotomy and flap which necessitated transfer from [place] to [facility] where neurosurgery performed the left side allograft cranioplasty.  Repeat CT head was significant with worsening fluid requiring left frontal burr hole for evacuation on 10/30.  Hospitalization further complicated by development of an infected bone flap with culture grew  MRSA.  Patient underwent left bone flap removal and was treated with IV antibiotics for 6 weeks per infectious disease recommendation he completed 2 weeks of oral vancomycin  to complete a total of 8 weeks of antibiotics on 1/6 He is currently ward of the state now through DSS.  Currently does not have any bed offers, difficult to place.    10/27 crani w/ flap replacement 10/29: off clevi, liberate SBP goal, Klonopin , lithium , Haldol  as needed discontinued.  Latuda  dose cut by half 10/30: repeat CT head with increased collection - going to OR this afternoon for evacuation 10/31: OR  for [facility] evacuation.  Developed arm twitching.  Klonopin  restarted at half dose 11/1: More awake.  No complaint 11/2 transferred out of ICU. 11/4 cortrak removed 11/8 CT head showed fluid collection under bone flap, decision made to remove bone flap 11/10 to OR for L bone flap removal 11/11 repeat CT head stable 11/29: notice to have worsening Dysphagia.  12/1 repeat CT head: stable.  12/3: Cortrack placed for nutrition, sutures removed by neurosurgery.  Tube feeds  started. 12/4: Very sedated, psychiatry requested to readjust meds, reduced clonazepam  to0.5mg  BID, decreased valbenazine  to 40 mg daily 12/5: Noted oriented but jaw tremors worse.  Evaluated by neurology, recommended psych to continue to follow and adjust medications  Assessment & Plan:   Principal Problem:   Subdural hematoma (HCC) Active Problems:   Bipolar disorder (HCC)   Unable to make decisions about medical treatment due to impaired mental capacity   Alcohol withdrawal (HCC)   Essential hypertension   Protein-calorie malnutrition, severe   Acute metabolic encephalopathy   Infection of craniotomy plate  1-Recurrent subdural hematoma: - Patient underwent craniotomy and bur hole procedure per neurosurgery - Per neurosurgery patient could have reaccumulation of fluid.  No new neurological changes. Neurological stable.    MRSA infected bone flap - Infectious disease consulted and patient was treated with IV vancomycin  for 6 weeks completed 09/23/24. He was subsequently started on 2 weeks of doxycycline  which completed 1/6   Acute metabolic encephalopathy - Related to above, mental status is stable, he required intermittent safety sitter   Bipolar disorder: Continue Ingressa, cogentin .    Drug-induced parkinsonism / Akathisia: Likely related  to use of antipsychotics, EEG obtained and was negative. Continue propranolol , Klonopin , and Ingrezza    Dysphagia: -Patient required NG tube for tube feeding from 12/3 until 12/8 -On  dysphagia 3 diet and doing well.   HTN: Continue propranolol , amlodipine  and losartan .  Blood pressure controlled.   Sinus bradycardia: Complicated by propranolol , dose reduced hold the parameters   Severe malnutrition: Continue with supplement   Iron deficiency anemia: Continue with ferrous sulfate   Aspiration pneumonia: Completed IV Unasyn   Acute hypoxic respiratory  failure secondary to pneumonia. Resolved   Alcohol use disorder, alcohol  withdrawal Completed CIWA and phenobarbital  taper. Continue thiamine  folic acid  and multivitamin  DVT prophylaxis: enoxaparin  (LOVENOX ) injection 40 mg Start: 10/01/24 1400 Place TED hose Start: 08/12/24 1150 SCDs Start: 08/01/24 1745 Place and maintain sequential compression device Start: 07/29/24 1819   Code Status: Full Code  Family Communication:  None present at bedside.   Status is: Inpatient Remains inpatient appropriate because: Medically stable, pending placement   Estimated body mass index is 26.03 kg/m as calculated from the following:   Height as of this encounter: 5' 10 (1.778 m).   Weight as of this encounter: 82.3 kg.    Nutritional Assessment: Body mass index is 26.03 kg/m.SABRA Seen by dietician.  I agree with the assessment and plan as outlined below: Nutrition Status: Nutrition Problem: Severe Malnutrition Etiology: acute illness Signs/Symptoms: severe muscle depletion, moderate muscle depletion, percent weight loss Percent weight loss: 15 % (in 3 months) Interventions: Ensure Enlive (each supplement provides 350kcal and 20 grams of protein), MVI, Magic cup, Refer to RD note for recommendations  . Skin Assessment: I have examined the patient's skin and I agree with the wound assessment as performed by the wound care RN as outlined below:    Consultants:  Psychiatry, neurosurgery, neurology  Procedures:  Left-sided allograft cranioplasty Left frontal burr hole for evacuation of subdural hematoma Left bone flap removal Cortrak placement/removal  Antimicrobials:  Anti-infectives (From admission, onward)    Start     Dose/Rate Route Frequency Ordered Stop   09/24/24 1000  doxycycline  (VIBRA -TABS) tablet 100 mg  Status:  Discontinued        100 mg Oral Every 12 hours 09/21/24 1455 09/23/24 1142   09/24/24 1000  doxycycline  (VIBRA -TABS) tablet 100 mg        100 mg Oral Every 12 hours 09/23/24 1142 10/07/24 2003   09/13/24 1000  vancomycin  (VANCOCIN )  IVPB 1000 mg/200 mL premix        1,000 mg 200 mL/hr over 60 Minutes Intravenous Every 12 hours 09/13/24 0835 09/24/24 0559   09/02/24 1700  Ampicillin -Sulbactam (UNASYN ) 3 g in sodium chloride  0.9 % 100 mL IVPB        3 g 200 mL/hr over 30 Minutes Intravenous Every 6 hours 09/02/24 1611 09/07/24 1230   08/31/24 1000  vancomycin  (VANCOREADY) IVPB 750 mg/150 mL  Status:  Discontinued        750 mg 150 mL/hr over 60 Minutes Intravenous Every 12 hours 08/31/24 0015 09/13/24 0835   08/21/24 0800  vancomycin  (VANCOCIN ) IVPB 1000 mg/200 mL premix  Status:  Discontinued        1,000 mg 200 mL/hr over 60 Minutes Intravenous Every 12 hours 08/21/24 0720 08/31/24 0015   08/19/24 0600  vancomycin  (VANCOREADY) IVPB 1250 mg/250 mL  Status:  Discontinued        1,250 mg 166.7 mL/hr over 90 Minutes Intravenous Every 12 hours 08/18/24 1818 08/21/24 0547   08/18/24 1830  vancomycin  (VANCOREADY) IVPB 500 mg/100 mL        500 mg 100 mL/hr over 60 Minutes Intravenous  Once 08/18/24 1817 08/19/24 1642   08/16/24 1600  vancomycin  (VANCOREADY) IVPB 750 mg/150 mL  Status:  Discontinued        750 mg 150 mL/hr over 60 Minutes Intravenous Every 8 hours 08/16/24 0828 08/18/24 1818   08/14/24 0800  vancomycin  (VANCOCIN ) IVPB 1000 mg/200 mL premix  Status:  Discontinued  1,000 mg 200 mL/hr over 60 Minutes Intravenous Every 8 hours 08/14/24 0016 08/16/24 0828   08/14/24 0030  vancomycin  (VANCOCIN ) IVPB 1000 mg/200 mL premix        1,000 mg 200 mL/hr over 60 Minutes Intravenous NOW 08/14/24 0016 08/14/24 0135   08/13/24 0100  vancomycin  (VANCOREADY) IVPB 750 mg/150 mL  Status:  Discontinued        750 mg 150 mL/hr over 60 Minutes Intravenous Every 12 hours 08/12/24 1230 08/14/24 0007   08/12/24 1400  ceFAZolin  (ANCEF ) IVPB 2g/100 mL premix  Status:  Discontinued        2 g 200 mL/hr over 30 Minutes Intravenous Every 8 hours 08/12/24 1149 08/12/24 1230   08/12/24 1400  cefTAZidime  (FORTAZ ) 2 g in sodium  chloride 0.9 % 100 mL IVPB  Status:  Discontinued        2 g 200 mL/hr over 30 Minutes Intravenous Every 8 hours 08/12/24 1152 08/13/24 1109   08/12/24 1245  ceFAZolin  (ANCEF ) IVPB 2g/100 mL premix  Status:  Discontinued        2 g 200 mL/hr over 30 Minutes Intravenous  Once 08/12/24 1149 08/12/24 1449   08/12/24 1245  vancomycin  (VANCOREADY) IVPB 1250 mg/250 mL        1,250 mg 166.7 mL/hr over 90 Minutes Intravenous  Once 08/12/24 1152 08/12/24 1501   08/12/24 0957  vancomycin  (VANCOCIN ) powder  Status:  Discontinued          As needed 08/12/24 1008 08/12/24 1037   08/01/24 2200  ceFAZolin  (ANCEF ) IVPB 2g/100 mL premix        2 g 200 mL/hr over 30 Minutes Intravenous Every 8 hours 08/01/24 1703 08/02/24 1915   08/01/24 1530  vancomycin  (VANCOCIN ) powder  Status:  Discontinued          As needed 08/01/24 1559 08/01/24 1606   08/01/24 1421  ceFAZolin  (ANCEF ) 2-4 GM/100ML-% IVPB       Note to Pharmacy: Roslynn Birmingham: cabinet override      08/01/24 1421 08/02/24 0229   07/29/24 2200  ceFAZolin  (ANCEF ) IVPB 2g/100 mL premix        2 g 200 mL/hr over 30 Minutes Intravenous Every 8 hours 07/29/24 1859 07/30/24 1423   07/29/24 1654  vancomycin  (VANCOCIN ) powder  Status:  Discontinued          As needed 07/29/24 1655 07/29/24 1758   07/29/24 1515  ceFAZolin  (ANCEF ) IVPB 2g/100 mL premix        2 g 200 mL/hr over 30 Minutes Intravenous  Once 07/29/24 1421 07/29/24 1655         Subjective: Seen and examined.  Sleepy but arousable as usual.  No complaints.  Objective: Vitals:   10/22/24 0105 10/22/24 0437 10/22/24 0803 10/22/24 1122  BP: 109/75 119/80 120/87 120/87  Pulse: 62 (!) 54 (!) 58 65  Resp: 16 16 18 16   Temp: 97.6 F (36.4 C) (!) 97.4 F (36.3 C) 98.5 F (36.9 C) 97.9 F (36.6 C)  TempSrc: Oral Oral Oral Oral  SpO2: 95% 95% 95% 96%  Weight:  82.3 kg    Height:        Intake/Output Summary (Last 24 hours) at 10/22/2024 1141 Last data filed at 10/22/2024 0830 Gross per  24 hour  Intake 840 ml  Output 0 ml  Net 840 ml    Filed Weights   10/19/24 0846 10/21/24 0500 10/22/24 0437  Weight: 78.5 kg 78 kg 82.3 kg  Examination:  General exam: Appears calm and comfortable  Respiratory system: Clear to auscultation. Respiratory effort normal. Cardiovascular system: S1 & S2 heard, RRR. No JVD, murmurs, rubs, gallops or clicks. No pedal edema. Gastrointestinal system: Abdomen is nondistended, soft and nontender. No organomegaly or masses felt. Normal bowel sounds heard. Central nervous system: Alert and oriented. No focal neurological deficits. Extremities: Symmetric 5 x 5 power. Skin: No rashes, lesions or ulcers   Data Reviewed: I have personally reviewed following labs and imaging studies  CBC: No results for input(s): WBC, NEUTROABS, HGB, HCT, MCV, PLT in the last 168 hours.  Basic Metabolic Panel: No results for input(s): NA, K, CL, CO2, GLUCOSE, BUN, CREATININE, CALCIUM, MG, PHOS in the last 168 hours.  GFR: Estimated Creatinine Clearance: 78.7 mL/min (by C-G formula based on SCr of 1.03 mg/dL). Liver Function Tests: No results for input(s): AST, ALT, ALKPHOS, BILITOT, PROT, ALBUMIN  in the last 168 hours. No results for input(s): LIPASE, AMYLASE in the last 168 hours. No results for input(s): AMMONIA in the last 168 hours. Coagulation Profile: No results for input(s): INR, PROTIME in the last 168 hours. Cardiac Enzymes: No results for input(s): CKTOTAL, CKMB, CKMBINDEX, TROPONINI in the last 168 hours. BNP (last 3 results) No results for input(s): PROBNP in the last 8760 hours. HbA1C: No results for input(s): HGBA1C in the last 72 hours. CBG: Recent Labs  Lab 10/20/24 2002  GLUCAP 119*    Lipid Profile: No results for input(s): CHOL, HDL, LDLCALC, TRIG, CHOLHDL, LDLDIRECT in the last 72 hours. Thyroid Function Tests: No results for input(s): TSH,  T4TOTAL, FREET4, T3FREE, THYROIDAB in the last 72 hours. Anemia Panel: No results for input(s): VITAMINB12, FOLATE, FERRITIN, TIBC, IRON, RETICCTPCT in the last 72 hours. Sepsis Labs: No results for input(s): PROCALCITON, LATICACIDVEN in the last 168 hours.  No results found for this or any previous visit (from the past 240 hours).   Radiology Studies: No results found.  Scheduled Meds:  benztropine   1 mg Oral BID   clonazePAM   0.5 mg Oral TID   docusate  100 mg Oral Daily   enoxaparin  (LOVENOX ) injection  40 mg Subcutaneous Daily   feeding supplement  237 mL Oral BID BM   ferrous sulfate   325 mg Oral Q breakfast   folic acid   1 mg Oral Daily   gabapentin   200 mg Oral TID   mirtazapine   15 mg Oral QHS   multivitamin with minerals  1 tablet Oral Daily   nicotine   14 mg Transdermal Daily   pantoprazole   40 mg Oral QHS   polyethylene glycol  17 g Oral Daily   propranolol   20 mg Oral BID   senna  2 tablet Oral Daily   sodium chloride  flush  10-40 mL Intracatheter Q12H   thiamine   100 mg Oral Daily   valbenazine   40 mg Oral Daily   Continuous Infusions:   LOS: 99 days   Fredia Skeeter, MD [facility]  10/22/2024, 11:41 AM   *Please note that this is a verbal dictation therefore any spelling or grammatical errors are due to the Dragon Medical One system interpretation.  Please page via Amion and do not message via secure chat for urgent patient care matters. Secure chat can be used for non urgent patient care matters.  How to contact the [facility] Attending or Consulting provider 7A - 7P or covering provider during after hours 7P -7A, for this patient?  Check the care team in [facility] and look for a)  attending/consulting [facility] provider listed and b) the [facility] team listed. Page or secure chat 7A-7P. Log into [email or web address] and use [place]'s universal password to access. If you do not have the password, please contact the hospital operator. Locate the [facility]  provider you are looking for under [facility] and page to a number that you can be directly reached. If you still have difficulty reaching the provider, please page the [facility] (Director on Call) for the Hospitalists listed on amion for assistance.  "

## 2024-10-22 NOTE — Plan of Care (Signed)
  Problem: Nutrition: Goal: Adequate nutrition will be maintained Outcome: Progressing   Problem: Education: Goal: Knowledge of General Education information will improve Description: Including pain rating scale, medication(s)/side effects and non-pharmacologic comfort measures Outcome: Not Progressing   Problem: Health Behavior/Discharge Planning: Goal: Ability to manage health-related needs will improve Outcome: Not Progressing   

## 2024-10-22 NOTE — Plan of Care (Signed)
" °  Problem: Education: Goal: Knowledge of General Education information will improve Description: Including pain rating scale, medication(s)/side effects and non-pharmacologic comfort measures Outcome: Progressing   Problem: Health Behavior/Discharge Planning: Goal: Ability to manage health-related needs will improve Outcome: Progressing   Problem: Pain Managment: Goal: General experience of comfort will improve and/or be controlled Outcome: Progressing   Problem: Clinical Measurements: Goal: Usual level of consciousness will be regained or maintained. Outcome: Progressing   "

## 2024-10-23 NOTE — Progress Notes (Signed)
 OT Cancellation Note  Patient Details Name: Ernest Romero MRN: [id number] DOB: [birth year]   Cancelled Treatment:    Reason Eval/Treat Not Completed: Fatigue/lethargy limiting ability to participate (Pt sleeping upon OT arrival. Pt easy to wake but stating he would like to conitnue sleeping and requesting OT return later. OT to reattempt to see pt at a later time as appropriate/available.)  Margarie Rockey HERO., OTR/L, MA Acute Rehab [phone]   Margarie FORBES Horns 10/23/2024, 4:45 PM

## 2024-10-23 NOTE — Progress Notes (Signed)
 " PROGRESS NOTE    DVID PENDRY  FMW:[id number] DOB: 09/03/1966 DOA: 07/15/2024 PCP: [name]   Brief Narrative:  59 year old with past medical history significant for alcohol abuse, bipolar disorder, panic disorder presented secondary to being found down, he was found to have a large subdural hematoma, underwent left frontoparietal craniotomy with evacuation of the subdural hematoma and placement of a drain and subsequent removal.  Patient required further management of his craniotomy and flap which necessitated transfer from [facility] to [facility] where neurosurgery performed the left side allograft cranioplasty.  Repeat CT head was significant with worsening fluid requiring left frontal burr hole for evacuation on 10/30.  Hospitalization further complicated by development of an infected bone flap with culture grew  MRSA.  Patient underwent left bone flap removal and was treated with IV antibiotics for 6 weeks per infectious disease recommendation he completed 2 weeks of oral vancomycin  to complete a total of 8 weeks of antibiotics on 1/6 He is currently ward of the state now through DSS.  Currently does not have any bed offers, difficult to place.    10/27 crani w/ flap replacement 10/29: off clevi, liberate SBP goal, Klonopin , lithium , Haldol  as needed discontinued.  Latuda  dose cut by half 10/30: repeat CT head with increased collection - going to OR this afternoon for evacuation 10/31: OR  for SDH evacuation.  Developed arm twitching.  Klonopin  restarted at half dose 11/1: More awake.  No complaint 11/2 transferred out of ICU. 11/4 cortrak removed 11/8 CT head showed fluid collection under bone flap, decision made to remove bone flap 11/10 to OR for L bone flap removal 11/11 repeat CT head stable 11/29: notice to have worsening Dysphagia.  12/1 repeat CT head: stable.  12/3: Cortrack placed for nutrition, sutures removed by neurosurgery.  Tube feeds  started. 12/4: Very sedated, psychiatry requested to readjust meds, reduced clonazepam  to0.5mg  BID, decreased valbenazine  to 40 mg daily 12/5: Noted oriented but jaw tremors worse.  Evaluated by neurology, recommended psych to continue to follow and adjust medications  Assessment & Plan:   Principal Problem:   Subdural hematoma (HCC) Active Problems:   Bipolar disorder (HCC)   Unable to make decisions about medical treatment due to impaired mental capacity   Alcohol withdrawal (HCC)   Essential hypertension   Protein-calorie malnutrition, severe   Acute metabolic encephalopathy   Infection of craniotomy plate  1-Recurrent subdural hematoma: - Patient underwent craniotomy and bur hole procedure per neurosurgery - Per neurosurgery patient could have reaccumulation of fluid.  No new neurological changes. Neurological stable.    MRSA infected bone flap - Infectious disease consulted and patient was treated with IV vancomycin  for 6 weeks completed 09/23/24. He was subsequently started on 2 weeks of doxycycline  which completed 1/6   Acute metabolic encephalopathy - Related to above, mental status is stable, he required intermittent safety sitter.  Nurse tells me today that patient is doing well, he knows he needs to wear helmet when up out of the bed, he has not fallen at all or has exhibited any signs of balance problem for more than a month according to the nurse and they are requesting to discontinue TeleSitter.  I have discontinued further request and information.   Bipolar disorder: Continue Ingressa, cogentin .    Drug-induced parkinsonism / Akathisia: Likely related  to use of antipsychotics, EEG obtained and was negative. Continue propranolol , Klonopin , and Ingrezza    Dysphagia: -Patient required NG tube for tube feeding from  12/3 until 12/8 -On  dysphagia 3 diet and doing well.   HTN: Continue propranolol , amlodipine  and losartan .  Blood pressure controlled.   Sinus  bradycardia: Complicated by propranolol , dose reduced hold the parameters   Severe malnutrition: Continue with supplement   Iron deficiency anemia: Continue with ferrous sulfate   Aspiration pneumonia: Completed IV Unasyn   Acute hypoxic respiratory failure secondary to pneumonia. Resolved   Alcohol use disorder, alcohol withdrawal Completed CIWA and phenobarbital  taper. Continue thiamine  folic acid  and multivitamin  DVT prophylaxis: enoxaparin  (LOVENOX ) injection 40 mg Start: 10/01/24 1400 Place TED hose Start: 08/12/24 1150 SCDs Start: 08/01/24 1745 Place and maintain sequential compression device Start: 07/29/24 1819   Code Status: Full Code  Family Communication:  None present at bedside.   Status is: Inpatient Remains inpatient appropriate because: Medically stable, pending placement   Estimated body mass index is 25.02 kg/m as calculated from the following:   Height as of this encounter: 5' 10 (1.778 m).   Weight as of this encounter: 79.1 kg.    Nutritional Assessment: Body mass index is 25.02 kg/m.SABRA Seen by dietician.  I agree with the assessment and plan as outlined below: Nutrition Status: Nutrition Problem: Severe Malnutrition Etiology: acute illness Signs/Symptoms: severe muscle depletion, moderate muscle depletion, percent weight loss Percent weight loss: 15 % (in 3 months) Interventions: Ensure Enlive (each supplement provides 350kcal and 20 grams of protein), MVI, Magic cup, Refer to RD note for recommendations  . Skin Assessment: I have examined the patient's skin and I agree with the wound assessment as performed by the wound care RN as outlined below:    Consultants:  Psychiatry, neurosurgery, neurology  Procedures:  Left-sided allograft cranioplasty Left frontal burr hole for evacuation of subdural hematoma Left bone flap removal Cortrak placement/removal  Antimicrobials:  Anti-infectives (From admission, onward)    Start     Dose/Rate  Route Frequency Ordered Stop   09/24/24 1000  doxycycline  (VIBRA -TABS) tablet 100 mg  Status:  Discontinued        100 mg Oral Every 12 hours 09/21/24 1455 09/23/24 1142   09/24/24 1000  doxycycline  (VIBRA -TABS) tablet 100 mg        100 mg Oral Every 12 hours 09/23/24 1142 10/07/24 2003   09/13/24 1000  vancomycin  (VANCOCIN ) IVPB 1000 mg/200 mL premix        1,000 mg 200 mL/hr over 60 Minutes Intravenous Every 12 hours 09/13/24 0835 09/24/24 0559   09/02/24 1700  Ampicillin -Sulbactam (UNASYN ) 3 g in sodium chloride  0.9 % 100 mL IVPB        3 g 200 mL/hr over 30 Minutes Intravenous Every 6 hours 09/02/24 1611 09/07/24 1230   08/31/24 1000  vancomycin  (VANCOREADY) IVPB 750 mg/150 mL  Status:  Discontinued        750 mg 150 mL/hr over 60 Minutes Intravenous Every 12 hours 08/31/24 0015 09/13/24 0835   08/21/24 0800  vancomycin  (VANCOCIN ) IVPB 1000 mg/200 mL premix  Status:  Discontinued        1,000 mg 200 mL/hr over 60 Minutes Intravenous Every 12 hours 08/21/24 0720 08/31/24 0015   08/19/24 0600  vancomycin  (VANCOREADY) IVPB 1250 mg/250 mL  Status:  Discontinued        1,250 mg 166.7 mL/hr over 90 Minutes Intravenous Every 12 hours 08/18/24 1818 08/21/24 0547   08/18/24 1830  vancomycin  (VANCOREADY) IVPB 500 mg/100 mL        500 mg 100 mL/hr over 60 Minutes Intravenous  Once 08/18/24 1817 08/19/24  1642   08/16/24 1600  vancomycin  (VANCOREADY) IVPB 750 mg/150 mL  Status:  Discontinued        750 mg 150 mL/hr over 60 Minutes Intravenous Every 8 hours 08/16/24 0828 08/18/24 1818   08/14/24 0800  vancomycin  (VANCOCIN ) IVPB 1000 mg/200 mL premix  Status:  Discontinued        1,000 mg 200 mL/hr over 60 Minutes Intravenous Every 8 hours 08/14/24 0016 08/16/24 0828   08/14/24 0030  vancomycin  (VANCOCIN ) IVPB 1000 mg/200 mL premix        1,000 mg 200 mL/hr over 60 Minutes Intravenous NOW 08/14/24 0016 08/14/24 0135   08/13/24 0100  vancomycin  (VANCOREADY) IVPB 750 mg/150 mL  Status:   Discontinued        750 mg 150 mL/hr over 60 Minutes Intravenous Every 12 hours 08/12/24 1230 08/14/24 0007   08/12/24 1400  ceFAZolin  (ANCEF ) IVPB 2g/100 mL premix  Status:  Discontinued        2 g 200 mL/hr over 30 Minutes Intravenous Every 8 hours 08/12/24 1149 08/12/24 1230   08/12/24 1400  cefTAZidime  (FORTAZ ) 2 g in sodium chloride  0.9 % 100 mL IVPB  Status:  Discontinued        2 g 200 mL/hr over 30 Minutes Intravenous Every 8 hours 08/12/24 1152 08/13/24 1109   08/12/24 1245  ceFAZolin  (ANCEF ) IVPB 2g/100 mL premix  Status:  Discontinued        2 g 200 mL/hr over 30 Minutes Intravenous  Once 08/12/24 1149 08/12/24 1449   08/12/24 1245  vancomycin  (VANCOREADY) IVPB 1250 mg/250 mL        1,250 mg 166.7 mL/hr over 90 Minutes Intravenous  Once 08/12/24 1152 08/12/24 1501   08/12/24 0957  vancomycin  (VANCOCIN ) powder  Status:  Discontinued          As needed 08/12/24 1008 08/12/24 1037   08/01/24 2200  ceFAZolin  (ANCEF ) IVPB 2g/100 mL premix        2 g 200 mL/hr over 30 Minutes Intravenous Every 8 hours 08/01/24 1703 08/02/24 1915   08/01/24 1530  vancomycin  (VANCOCIN ) powder  Status:  Discontinued          As needed 08/01/24 1559 08/01/24 1606   08/01/24 1421  ceFAZolin  (ANCEF ) 2-4 GM/100ML-% IVPB       Note to Pharmacy: Roslynn Birmingham: cabinet override      08/01/24 1421 08/02/24 0229   07/29/24 2200  ceFAZolin  (ANCEF ) IVPB 2g/100 mL premix        2 g 200 mL/hr over 30 Minutes Intravenous Every 8 hours 07/29/24 1859 07/30/24 1423   07/29/24 1654  vancomycin  (VANCOCIN ) powder  Status:  Discontinued          As needed 07/29/24 1655 07/29/24 1758   07/29/24 1515  ceFAZolin  (ANCEF ) IVPB 2g/100 mL premix        2 g 200 mL/hr over 30 Minutes Intravenous  Once 07/29/24 1421 07/29/24 1655         Subjective: Patient seen and examined.  Fully awake and alert and oriented.  He has no complaints.  Objective: Vitals:   10/22/24 1912 10/23/24 0402 10/23/24 0432 10/23/24 0839  BP:  110/77 98/68  (!) 110/96  Pulse: 65 (!) 52  66  Resp: 18 18  19   Temp: 97.9 F (36.6 C) 97.8 F (36.6 C)  98.5 F (36.9 C)  TempSrc:      SpO2: 97% 94%  91%  Weight:   79.1 kg   Height:  Intake/Output Summary (Last 24 hours) at 10/23/2024 1003 Last data filed at 10/22/2024 2200 Gross per 24 hour  Intake 600 ml  Output --  Net 600 ml    Filed Weights   10/21/24 0500 10/22/24 0437 10/23/24 0432  Weight: 78 kg 82.3 kg 79.1 kg    Examination:  General exam: Appears calm and comfortable  Respiratory system: Clear to auscultation. Respiratory effort normal. Cardiovascular system: S1 & S2 heard, RRR. No JVD, murmurs, rubs, gallops or clicks. No pedal edema. Gastrointestinal system: Abdomen is nondistended, soft and nontender. No organomegaly or masses felt. Normal bowel sounds heard. Central nervous system: Alert and oriented. No focal neurological deficits. Extremities: Symmetric 5 x 5 power. Skin: No rashes, lesions or ulcers   Data Reviewed: I have personally reviewed following labs and imaging studies  CBC: No results for input(s): WBC, NEUTROABS, HGB, HCT, MCV, PLT in the last 168 hours.  Basic Metabolic Panel: No results for input(s): NA, K, CL, CO2, GLUCOSE, BUN, CREATININE, CALCIUM, MG, PHOS in the last 168 hours.  GFR: Estimated Creatinine Clearance: 78.7 mL/min (by C-G formula based on SCr of 1.03 mg/dL). Liver Function Tests: No results for input(s): AST, ALT, ALKPHOS, BILITOT, PROT, ALBUMIN  in the last 168 hours. No results for input(s): LIPASE, AMYLASE in the last 168 hours. No results for input(s): AMMONIA in the last 168 hours. Coagulation Profile: No results for input(s): INR, PROTIME in the last 168 hours. Cardiac Enzymes: No results for input(s): CKTOTAL, CKMB, CKMBINDEX, TROPONINI in the last 168 hours. BNP (last 3 results) No results for input(s): PROBNP in the last 8760  hours. HbA1C: No results for input(s): HGBA1C in the last 72 hours. CBG: Recent Labs  Lab 10/20/24 2002  GLUCAP 119*    Lipid Profile: No results for input(s): CHOL, HDL, LDLCALC, TRIG, CHOLHDL, LDLDIRECT in the last 72 hours. Thyroid Function Tests: No results for input(s): TSH, T4TOTAL, FREET4, T3FREE, THYROIDAB in the last 72 hours. Anemia Panel: No results for input(s): VITAMINB12, FOLATE, FERRITIN, TIBC, IRON, RETICCTPCT in the last 72 hours. Sepsis Labs: No results for input(s): PROCALCITON, LATICACIDVEN in the last 168 hours.  No results found for this or any previous visit (from the past 240 hours).   Radiology Studies: No results found.  Scheduled Meds:  benztropine   1 mg Oral BID   clonazePAM   0.5 mg Oral TID   docusate  100 mg Oral Daily   enoxaparin  (LOVENOX ) injection  40 mg Subcutaneous Daily   feeding supplement  237 mL Oral BID BM   ferrous sulfate   325 mg Oral Q breakfast   folic acid   1 mg Oral Daily   gabapentin   200 mg Oral TID   mirtazapine   15 mg Oral QHS   multivitamin with minerals  1 tablet Oral Daily   nicotine   14 mg Transdermal Daily   pantoprazole   40 mg Oral QHS   polyethylene glycol  17 g Oral Daily   propranolol   20 mg Oral BID   senna  2 tablet Oral Daily   sodium chloride  flush  10-40 mL Intracatheter Q12H   thiamine   100 mg Oral Daily   valbenazine   40 mg Oral Daily   Continuous Infusions:   LOS: 100 days   Fredia Skeeter, MD [facility]  10/23/2024, 10:03 AM   *Please note that this is a verbal dictation therefore any spelling or grammatical errors are due to the Dragon Medical One system interpretation.  Please page via Amion and do not message via  secure chat for urgent patient care matters. Secure chat can be used for non urgent patient care matters.  How to contact the [facility] Attending or Consulting provider 7A - 7P or covering provider during after hours 7P -7A, for this  patient?  Check the care team in [facility] and look for a) attending/consulting [facility] provider listed and b) the [facility] team listed. Page or secure chat 7A-7P. Log into [email or web address] and use [facility] universal password to access. If you do not have the password, please contact the hospital operator. Locate the [facility] provider you are looking for under [facility] and page to a number that you can be directly reached. If you still have difficulty reaching the provider, please page the [facility] (Director on Call) for the Hospitalists listed on amion for assistance.  "

## 2024-10-23 NOTE — Progress Notes (Signed)
 Physical Therapy Treatment Patient Details Name: Ernest Romero MRN: [id number] DOB: Jun 04, 1966 Today's Date: 10/23/2024   History of Present Illness 59 y.o. male presenting 07/16/24 from [facility] where he was [facility]30/[facility]. 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 On CIWA protocol. Transferred to [facility] for further management of craniotomy and flap. 10/27 s/p cranioplasty with bone flap replacement. 10/28 Change in status thought to be related to Klonopin  administration 10/28, repeat CTH shows significant increase in size of mixed attenuation subdural fluid collection underlying the L tempoparietal craniotomy site, now measuring approximately 15 mm in thickness, with associated mild mass effect and slight rightward midline shift. OR on 10/30 for SDH evacuation. [facility] 11/7 with fluid collected under bone flap. 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 Worsening dysphagia 11/29; repeat CTH stable 12/1. Cortrak placed 12/3-12/8. PMH includes alcohol use disorder, psychiatric disorder, HTN.    PT Comments  Pt received in supine and agreeable to session. Pt reports increased fatigue limiting activity tolerance. Pt able to don shoes and helmet without cues using sign in room once asked to prepare to walk. Pt able to perform gait trial with dynamic balance challenges requiring up to CGA for safety. Pt able to perform stair trial, but demonstrates some impulsivity initially requiring min A for a LOB and cues for safety. Pt requests to use the bathroom at the end of the session and is able to do so with supervision for safety. Pt continues to benefit from PT services to progress toward functional mobility goals.    If plan is discharge home, recommend the following: Assistance with cooking/housework;Direct supervision/assist for medications management;Direct supervision/assist for  financial management;Assist for transportation;Help with stairs or ramp for entrance;Supervision due to cognitive status;A little help with walking and/or transfers;A little help with bathing/dressing/bathroom   Can travel by private vehicle     Yes  Equipment Recommendations  None recommended by PT    Recommendations for Other Services       Precautions / Restrictions Precautions Precautions: Fall Recall of Precautions/Restrictions: Impaired Precaution/Restrictions Comments: SBP <160, helmet (pt to wear when OOB) Restrictions Weight Bearing Restrictions Per Provider Order: No     Mobility  Bed Mobility Overal bed mobility: Modified Independent Bed Mobility: Supine to Sit                Transfers Overall transfer level: Needs assistance   Transfers: Sit to/from Stand Sit to Stand: Supervision           General transfer comment: supervision and cues for safety    Ambulation/Gait Ambulation/Gait assistance: Contact guard assist, Supervision Gait Distance (Feet): 350 Feet Assistive device: None Gait Pattern/deviations: Step-through pattern, Decreased stride length, Narrow base of support       General Gait Details: Pt demonstrates stiff gait. cues for widened steps due to pt intermittently hitting other shoe during swing through causing instability. able to perform head turns and step over obstacles with slight decrease in pace, but no LOB   Stairs Stairs: Yes Stairs assistance: Contact guard assist, Min assist Stair Management: One rail Right, Alternating pattern, Step to pattern Number of Stairs: 10 General stair comments: 1 LOB on first step due to only putting toes on step and some decreased awarness. improved with cues for increased BOS and pacing. increased difficulty with descent and narrowed steps at the end of the trial   Wheelchair  Mobility     Tilt Bed    Modified Rankin (Stroke Patients Only)       Balance Overall balance assessment:  Needs assistance Sitting-balance support: No upper extremity supported, Feet supported Sitting balance-Leahy Scale: Good     Standing balance support: No upper extremity supported, During functional activity Standing balance-Leahy Scale: Fair Standing balance comment: Some instability with shoes on requiring intermittent min A for stability               High Level Balance Comments: head turns and stepping over obstacles            Communication Communication Communication: No apparent difficulties  Cognition Arousal: Alert Behavior During Therapy: WFL for tasks assessed/performed   PT - Cognitive impairments: Memory, Safety/Judgement, Attention                   Rancho Levels of Cognitive Functioning Rancho Los Amigos Scales of Cognitive Functioning: Automatic, Appropriate: Minimal Assistance for Daily Living Skills Rancho Los Amigos Scales of Cognitive Functioning: Automatic, Appropriate: Minimal Assistance for Daily Living Skills [VII]   Following commands: Impaired Following commands impaired: Follows multi-step commands with increased time    Cueing Cueing Techniques: Verbal cues, Visual cues  Exercises      General Comments        Pertinent Vitals/Pain Pain Assessment Pain Assessment: Faces Faces Pain Scale: Hurts a little bit Pain Location: all over Pain Descriptors / Indicators: Aching Pain Intervention(s): Monitored during session     PT Goals (current goals can now be found in the care plan section) Acute Rehab PT Goals Patient Stated Goal: rehab PT Goal Formulation: With patient Time For Goal Achievement: 10/28/24 Progress towards PT goals: Progressing toward goals    Frequency    Min 2X/week       AM-PAC PT 6 Clicks Mobility   Outcome Measure  Help needed turning from your back to your side while in a flat bed without using bedrails?: None Help needed moving from lying on your back to sitting on the side of a flat bed  without using bedrails?: None Help needed moving to and from a bed to a chair (including a wheelchair)?: A Little Help needed standing up from a chair using your arms (e.g., wheelchair or bedside chair)?: A Little Help needed to walk in hospital room?: A Little Help needed climbing 3-5 steps with a railing? : A Little 6 Click Score: 20    End of Session Equipment Utilized During Treatment: Other (comment);Gait belt (helmet) Activity Tolerance: Patient tolerated treatment well;Patient limited by fatigue Patient left: in bed;with call bell/phone within reach;with bed alarm set Nurse Communication: Mobility status PT Visit Diagnosis: Other abnormalities of gait and mobility (R26.89);Other symptoms and signs involving the nervous system (R29.898)     Time: 8860-8840 PT Time Calculation (min) (ACUTE ONLY): 20 min  Charges:    $Gait Training: 8-22 mins PT General Charges $$ ACUTE PT VISIT: 1 Visit                    Darryle George, PTA Acute Rehabilitation Services Secure Chat Preferred  [phone]) [phone]    Darryle George 10/23/2024, 1:16 PM

## 2024-10-23 NOTE — Progress Notes (Signed)
 Psychiatric Nurse Liaison Rounding Note   Patient Mood/Affect: flat  Noted Patient Behaviors: lying in bed; NAD  Interventions Initiated by Psychiatric Nurse Liaison: therapeutic communication and support  Recommendations for Patient Care: continue plan of care  Time Spent with Patient:   5 minutes  Loetta Pinal RN, BSN, RN-BC

## 2024-10-23 NOTE — Plan of Care (Signed)
  Problem: Education: Goal: Knowledge of General Education information will improve Description: Including pain rating scale, medication(s)/side effects and non-pharmacologic comfort measures Outcome: Progressing   Problem: Health Behavior/Discharge Planning: Goal: Ability to manage health-related needs will improve Outcome: Progressing   Problem: Clinical Measurements: Goal: Ability to maintain clinical measurements within normal limits will improve Outcome: Progressing Goal: Will remain free from infection Outcome: Progressing Goal: Diagnostic test results will improve Outcome: Progressing Goal: Respiratory complications will improve Outcome: Progressing Goal: Cardiovascular complication will be avoided Outcome: Progressing   Problem: Activity: Goal: Risk for activity intolerance will decrease Outcome: Progressing   Problem: Nutrition: Goal: Adequate nutrition will be maintained Outcome: Progressing   Problem: Coping: Goal: Level of anxiety will decrease Outcome: Progressing   Problem: Elimination: Goal: Will not experience complications related to bowel motility Outcome: Progressing Goal: Will not experience complications related to urinary retention Outcome: Progressing   Problem: Pain Managment: Goal: General experience of comfort will improve and/or be controlled Outcome: Progressing   Problem: Safety: Goal: Ability to remain free from injury will improve Outcome: Progressing   Problem: Skin Integrity: Goal: Risk for impaired skin integrity will decrease Outcome: Progressing   Problem: Education: Goal: Knowledge of the prescribed therapeutic regimen will improve Outcome: Progressing   Problem: Clinical Measurements: Goal: Usual level of consciousness will be regained or maintained. Outcome: Progressing Goal: Neurologic status will improve Outcome: Progressing Goal: Ability to maintain intracranial pressure will improve Outcome: Progressing    Problem: Skin Integrity: Goal: Demonstration of wound healing without infection will improve Outcome: Progressing

## 2024-10-24 NOTE — Progress Notes (Signed)
 Occupational Therapy Treatment Patient Details Name: Ernest Romero MRN: [id number] DOB: May 10, 1966 Today's Date: 10/24/2024   History of present illness 59 y.o. male presenting 07/16/24 from [facility] where he was admitted 06/30/[facility]. 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 On CIWA protocol. Transferred to [facility] for further management of craniotomy and flap. 10/27 s/p cranioplasty with bone flap replacement. 10/28 Change in status thought to be related to Klonopin  administration 10/28, repeat CTH shows significant increase in size of mixed attenuation subdural fluid collection underlying the L tempoparietal craniotomy site, now measuring approximately 15 mm in thickness, with associated mild mass effect and slight rightward midline shift. OR on 10/30 for SDH evacuation. [facility] 11/7 with fluid collected under bone flap. 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 Worsening dysphagia 11/29; repeat CTH stable 12/1. Cortrak placed 12/3-12/8. PMH includes alcohol use disorder, psychiatric disorder, HTN.   OT comments  OT session focused on training in techniques for increased safety and independence with functional tasks with pt currently demonstrating ability to complete ADLs with Mod I to Supervision and functional mobility with Supervision to Contact guard assist. Pt checking poster for items he needs for safe ambulation without cues. However, pt distracted after putting his shoes on and requiring one cue (OT asking pt, Do you have everything you need?) to recheck poster and self-correct with pt realizing he forgot helmet and donning helmet. Pt also demonstrating ability to follow two-step navigation task in hallway and locate room with Supervision for safety and no cues needed. Pt participated well in session and is making progress toward or has met all OT goals. OT goals updated this  day based on pt progress and current functional level. VSS on RA. Acute OT to continue to follow. Post acute discharge, OT recommends group home or similar community-based placement with 24/7 supervision/assistance paired with HH OT.       If plan is discharge home, recommend the following:  Supervision due to cognitive status;Direct supervision/assist for medications management;Assistance with cooking/housework;Direct supervision/assist for financial management;Assist for transportation;Assistance with feeding;A little help with walking and/or transfers;Help with stairs or ramp for entrance;A little help with bathing/dressing/bathroom   Equipment Recommendations  None recommended by OT    Recommendations for Other Services      Precautions / Restrictions Precautions Precautions: Fall Restrictions Weight Bearing Restrictions Per Provider Order: No       Mobility Bed Mobility Overal bed mobility: Modified Independent Bed Mobility: Supine to Sit     Supine to sit: Modified independent (Device/Increase time)          Transfers Overall transfer level: Needs assistance Equipment used: None Transfers: Sit to/from Stand, Bed to chair/wheelchair/BSC Sit to Stand: Supervision     Step pivot transfers: Supervision     General transfer comment: Supervision for safety     Balance Overall balance assessment: Needs assistance Sitting-balance support: No upper extremity supported, Feet supported Sitting balance-Leahy Scale: Good     Standing balance support: No upper extremity supported, During functional activity Standing balance-Leahy Scale: Fair Standing balance comment: Some instability with shoes on requiring intermittent Contact guard assist for stability                           ADL either performed or assessed with clinical judgement   ADL Overall ADL's : Needs assistance/impaired Eating/Feeding: Modified  independent;Set up;Sitting   Grooming:  Supervision/safety;Standing   Upper Body Bathing: Supervision/ safety;Set up;Sitting   Lower Body Bathing: Supervison/ safety;Set up;Sit to/from stand   Upper Body Dressing : Supervision/safety;Standing   Lower Body Dressing: Supervision/safety;Sit to/from stand;Sitting/lateral leans   Toilet Transfer: Supervision/safety;Ambulation;Regular Toilet   Toileting- Architect and Hygiene: Supervision/safety;Sit to/from stand       Functional mobility during ADLs: Supervision/safety;Contact guard assist (without an AD; Supervision for short distances in room and CGA for safety for longer distances in hallway) General ADL Comments: Pt checking poster of items needed prior to ambulating with Mod I, but getting distracted after putting on shoes. Pt requiring one verbal cue (OT asking pt, Do you have everything you need?) to recheck poster and self-correct with pt realizing he forgot helmet and donning helmet.    Extremity/Trunk Assessment Upper Extremity Assessment Upper Extremity Assessment: Right hand dominant;RUE deficits/detail;LUE deficits/detail RUE Deficits / Details: mildly decreased fine motor coordination; otherwise overall WFL for tasks assessed RUE Coordination: decreased fine motor LUE Deficits / Details: mildly decreased coordination; otherwise overall WFL for tasks assessed LUE Coordination: decreased gross motor;decreased fine motor   Lower Extremity Assessment Lower Extremity Assessment: Defer to PT evaluation        Vision       Perception     Praxis     Communication Communication Communication: Impaired Factors Affecting Communication: Difficulty expressing self;Reduced clarity of speech (requires mildly increased time for processing and word finding)   Cognition Arousal: Alert Behavior During Therapy: WFL for tasks assessed/performed Cognition: Cognition impaired     Awareness: Intellectual awareness intact, Online awareness impaired (emerging  online awareness with pt requring occasional cues to self-correct) Memory impairment (select all impairments): Working civil service fast streamer, Short-term memory Attention impairment (select first level of impairment): Divided attention Executive functioning impairment (select all impairments): Organization, Sequencing, Reasoning, Problem solving OT - Cognition Comments: Pt conitnues to present with cognitive deficts, but is making progress toward goals. Pt checking poster for items he needs before ambulating without cues. However, pt distracted after putting his shoes on and requiring one cue (OT asking pt, Do you have everything you need?) to recheck poster and self-correct with pt realizing he forgot helmet and donning helmet. Pt also demonstrating ability to follow two-step navigation task and locate room with Supervision for safety and no cues needed                 Following commands: Impaired Following commands impaired: Only follows one step commands consistently, Follows multi-step commands with increased time      Cueing   Cueing Techniques: Verbal cues  Exercises      Shoulder Instructions       General Comments VSS on RA    Pertinent Vitals/ Pain       Pain Assessment Pain Assessment: Faces Faces Pain Scale: Hurts a little bit Pain Location: lower back and B feet Pain Descriptors / Indicators: Aching, Sore Pain Intervention(s): Limited activity within patient's tolerance, Monitored during session, Repositioned  Home Living                                          Prior Functioning/Environment              Frequency  Min 2X/week        Progress Toward Goals  OT Goals(current goals can now be found in the care plan  section)  Progress towards OT goals: Progressing toward goals;Goals met and updated - see care plan;Goals updated (Pt is progressing toward or has met all OT goals. OT goals updated this day based on pt progress and current functional  level)  Acute Rehab OT Goals Patient Stated Goal: to be independent OT Goal Formulation: With patient Time For Goal Achievement: 11/21/24 Potential to Achieve Goals: Good ADL Goals Pt Will Perform Grooming: with modified independence;standing Pt Will Perform Upper Body Bathing: with modified independence;sitting (sitting at the sink) Pt Will Perform Lower Body Dressing: with modified independence;sit to/from stand Pt Will Transfer to Toilet: with modified independence;ambulating;regular height toilet Additional ADL Goal #2: Patient will demonstrate ability to participate in home exercise program to address jaw tremor and strengthening with Mod I and written HEP provided. Additional ADL Goal #3: Patient will demonstrate ability to utilize simple 3-4 item checklist (i.e. put on helmet, put on grip socks) to prepare for functional tasks and mobility with Mod I to increase safety and independence with funcitonal tasks. Additional ADL Goal #4: Patient will demonstrate ability to complete orientation activity with 3 or more steps outside of room with Supervision to increase safety and independence with functional tasks.  Plan      Co-evaluation                 AM-PAC OT 6 Clicks Daily Activity     Outcome Measure   Help from another person eating meals?: None Help from another person taking care of personal grooming?: A Little Help from another person toileting, which includes using toliet, bedpan, or urinal?: A Little Help from another person bathing (including washing, rinsing, drying)?: A Little Help from another person to put on and taking off regular upper body clothing?: A Little Help from another person to put on and taking off regular lower body clothing?: A Little 6 Click Score: 19    End of Session Equipment Utilized During Treatment: Gait belt;Other (comment) (helmet)  OT Visit Diagnosis: Other abnormalities of gait and mobility (R26.89);Other symptoms and signs  involving the nervous system (R29.898);Cognitive communication deficit (R41.841);Other symptoms and signs involving cognitive function Symptoms and signs involving cognitive functions:  (SDH s/p L frontoparietal craniotomy with evacuation of SDH)   Activity Tolerance Patient tolerated treatment well   Patient Left in bed;with call bell/phone within reach;with bed alarm set   Nurse Communication Mobility status        Time: 8690-8672 OT Time Calculation (min): 18 min  Charges: OT General Charges $OT Visit: 1 Visit OT Treatments $Self Care/Home Management : 8-22 mins  Margarie Rockey HERO., OTR/L, MA Acute Rehab [phone]   Margarie FORBES Horns 10/24/2024, 2:23 PM

## 2024-10-24 NOTE — Progress Notes (Signed)
 " PROGRESS NOTE    Ernest Romero  FMW:[id number] DOB: [birth year] DOA: 07/15/2024 PCP: [name]   Brief Narrative:  59 year old with past medical history significant for alcohol abuse, bipolar disorder, panic disorder presented secondary to being found down, he was found to have a large subdural hematoma, underwent left frontoparietal craniotomy with evacuation of the subdural hematoma and placement of a drain and subsequent removal.  Patient required further management of his craniotomy and flap which necessitated transfer from  to [facility] where neurosurgery performed the left side allograft cranioplasty.  Repeat CT head was significant with worsening fluid requiring left frontal burr hole for evacuation on 10/30.  Hospitalization further complicated by development of an infected bone flap with culture grew  MRSA.  Patient underwent left bone flap removal and was treated with IV antibiotics for 6 weeks per infectious disease recommendation he completed 2 weeks of oral vancomycin  to complete a total of 8 weeks of antibiotics on 1/6 He is currently ward of the state now through DSS.  Currently does not have any bed offers, difficult to place.    10/27 crani w/ flap replacement 10/29: off clevi, liberate SBP goal, Klonopin , lithium , Haldol  as needed discontinued.  Latuda  dose cut by half 10/30: repeat CT head with increased collection - going to OR this afternoon for evacuation 10/31: OR  for [facility] evacuation.  Developed arm twitching.  Klonopin  restarted at half dose 11/1: More awake.  No complaint 11/2 transferred out of ICU. 11/4 cortrak removed 11/8 CT head showed fluid collection under bone flap, decision made to remove bone flap 11/10 to OR for L bone flap removal 11/11 repeat CT head stable 11/29: notice to have worsening Dysphagia.  12/1 repeat CT head: stable.  12/3: Cortrack placed for nutrition, sutures removed by neurosurgery.  Tube feeds  started. 12/4: Very sedated, psychiatry requested to readjust meds, reduced clonazepam  to0.5mg  BID, decreased valbenazine  to 40 mg daily 12/5: Noted oriented but jaw tremors worse.  Evaluated by neurology, recommended psych to continue to follow and adjust medications  Assessment & Plan:   Principal Problem:   Subdural hematoma (HCC) Active Problems:   Bipolar disorder (HCC)   Unable to make decisions about medical treatment due to impaired mental capacity   Alcohol withdrawal (HCC)   Essential hypertension   Protein-calorie malnutrition, severe   Acute metabolic encephalopathy   Infection of craniotomy plate  1-Recurrent subdural hematoma: - Patient underwent craniotomy and bur hole procedure per neurosurgery - Per neurosurgery patient could have reaccumulation of fluid.  No new neurological changes. Neurological stable.    MRSA infected bone flap - Infectious disease consulted and patient was treated with IV vancomycin  for 6 weeks completed 09/23/24. He was subsequently started on 2 weeks of doxycycline  which completed 1/6   Acute metabolic encephalopathy - Related to above, mental status is stable, he required intermittent safety sitter.  Nurse tells me today that patient is doing well, he knows he needs to wear helmet when up out of the bed, he has not fallen at all or has exhibited any signs of balance problem for more than a month according to the nurse and they are requesting to discontinue TeleSitter.  I have discontinued further request and information.   Bipolar disorder: Continue Ingressa, cogentin .    Drug-induced parkinsonism / Akathisia: Likely related  to use of antipsychotics, EEG obtained and was negative. Continue propranolol , Klonopin , and Ingrezza    Dysphagia: -Patient required NG tube for tube feeding from  12/3 until 12/8 -On  dysphagia 3 diet and doing well.   HTN: Continue propranolol , amlodipine  and losartan .  Blood pressure controlled.   Sinus  bradycardia: Complicated by propranolol , dose reduced hold the parameters   Severe malnutrition: Continue with supplement   Iron deficiency anemia: Continue with ferrous sulfate   Aspiration pneumonia: Completed IV Unasyn   Acute hypoxic respiratory failure secondary to pneumonia. Resolved   Alcohol use disorder, alcohol withdrawal Completed CIWA and phenobarbital  taper. Continue thiamine  folic acid  and multivitamin  DVT prophylaxis: enoxaparin  (LOVENOX ) injection 40 mg Start: 10/01/24 1400 Place TED hose Start: 08/12/24 1150 SCDs Start: 08/01/24 1745 Place and maintain sequential compression device Start: 07/29/24 1819   Code Status: Full Code  Family Communication:  None present at bedside.   Status is: Inpatient Remains inpatient appropriate because: Medically stable, pending placement   Estimated body mass index is 25.02 kg/m as calculated from the following:   Height as of this encounter: 5' 10 (1.778 m).   Weight as of this encounter: 79.1 kg.    Nutritional Assessment: Body mass index is 25.02 kg/m.SABRA Seen by dietician.  I agree with the assessment and plan as outlined below: Nutrition Status: Nutrition Problem: Severe Malnutrition Etiology: acute illness Signs/Symptoms: severe muscle depletion, moderate muscle depletion, percent weight loss Percent weight loss: 15 % (in 3 months) Interventions: Ensure Enlive (each supplement provides 350kcal and 20 grams of protein), MVI, Magic cup, Refer to RD note for recommendations  . Skin Assessment: I have examined the patient's skin and I agree with the wound assessment as performed by the wound care RN as outlined below:    Consultants:  Psychiatry, neurosurgery, neurology  Procedures:  Left-sided allograft cranioplasty Left frontal burr hole for evacuation of subdural hematoma Left bone flap removal Cortrak placement/removal  Antimicrobials:  Anti-infectives (From admission, onward)    Start     Dose/Rate  Route Frequency Ordered Stop   09/24/24 1000  doxycycline  (VIBRA -TABS) tablet 100 mg  Status:  Discontinued        100 mg Oral Every 12 hours 09/21/24 1455 09/23/24 1142   09/24/24 1000  doxycycline  (VIBRA -TABS) tablet 100 mg        100 mg Oral Every 12 hours 09/23/24 1142 10/07/24 2003   09/13/24 1000  vancomycin  (VANCOCIN ) IVPB 1000 mg/200 mL premix        1,000 mg 200 mL/hr over 60 Minutes Intravenous Every 12 hours 09/13/24 0835 09/24/24 0559   09/02/24 1700  Ampicillin -Sulbactam (UNASYN ) 3 g in sodium chloride  0.9 % 100 mL IVPB        3 g 200 mL/hr over 30 Minutes Intravenous Every 6 hours 09/02/24 1611 09/07/24 1230   08/31/24 1000  vancomycin  (VANCOREADY) IVPB 750 mg/150 mL  Status:  Discontinued        750 mg 150 mL/hr over 60 Minutes Intravenous Every 12 hours 08/31/24 0015 09/13/24 0835   08/21/24 0800  vancomycin  (VANCOCIN ) IVPB 1000 mg/200 mL premix  Status:  Discontinued        1,000 mg 200 mL/hr over 60 Minutes Intravenous Every 12 hours 08/21/24 0720 08/31/24 0015   08/19/24 0600  vancomycin  (VANCOREADY) IVPB 1250 mg/250 mL  Status:  Discontinued        1,250 mg 166.7 mL/hr over 90 Minutes Intravenous Every 12 hours 08/18/24 1818 08/21/24 0547   08/18/24 1830  vancomycin  (VANCOREADY) IVPB 500 mg/100 mL        500 mg 100 mL/hr over 60 Minutes Intravenous  Once 08/18/24 1817 08/19/24  1642   08/16/24 1600  vancomycin  (VANCOREADY) IVPB 750 mg/150 mL  Status:  Discontinued        750 mg 150 mL/hr over 60 Minutes Intravenous Every 8 hours 08/16/24 0828 08/18/24 1818   08/14/24 0800  vancomycin  (VANCOCIN ) IVPB 1000 mg/200 mL premix  Status:  Discontinued        1,000 mg 200 mL/hr over 60 Minutes Intravenous Every 8 hours 08/14/24 0016 08/16/24 0828   08/14/24 0030  vancomycin  (VANCOCIN ) IVPB 1000 mg/200 mL premix        1,000 mg 200 mL/hr over 60 Minutes Intravenous NOW 08/14/24 0016 08/14/24 0135   08/13/24 0100  vancomycin  (VANCOREADY) IVPB 750 mg/150 mL  Status:   Discontinued        750 mg 150 mL/hr over 60 Minutes Intravenous Every 12 hours 08/12/24 1230 08/14/24 0007   08/12/24 1400  ceFAZolin  (ANCEF ) IVPB 2g/100 mL premix  Status:  Discontinued        2 g 200 mL/hr over 30 Minutes Intravenous Every 8 hours 08/12/24 1149 08/12/24 1230   08/12/24 1400  cefTAZidime  (FORTAZ ) 2 g in sodium chloride  0.9 % 100 mL IVPB  Status:  Discontinued        2 g 200 mL/hr over 30 Minutes Intravenous Every 8 hours 08/12/24 1152 08/13/24 1109   08/12/24 1245  ceFAZolin  (ANCEF ) IVPB 2g/100 mL premix  Status:  Discontinued        2 g 200 mL/hr over 30 Minutes Intravenous  Once 08/12/24 1149 08/12/24 1449   08/12/24 1245  vancomycin  (VANCOREADY) IVPB 1250 mg/250 mL        1,250 mg 166.7 mL/hr over 90 Minutes Intravenous  Once 08/12/24 1152 08/12/24 1501   08/12/24 0957  vancomycin  (VANCOCIN ) powder  Status:  Discontinued          As needed 08/12/24 1008 08/12/24 1037   08/01/24 2200  ceFAZolin  (ANCEF ) IVPB 2g/100 mL premix        2 g 200 mL/hr over 30 Minutes Intravenous Every 8 hours 08/01/24 1703 08/02/24 1915   08/01/24 1530  vancomycin  (VANCOCIN ) powder  Status:  Discontinued          As needed 08/01/24 1559 08/01/24 1606   08/01/24 1421  ceFAZolin  (ANCEF ) 2-4 GM/100ML-% IVPB       Note to Pharmacy: Roslynn Birmingham: cabinet override      08/01/24 1421 08/02/24 0229   07/29/24 2200  ceFAZolin  (ANCEF ) IVPB 2g/100 mL premix        2 g 200 mL/hr over 30 Minutes Intravenous Every 8 hours 07/29/24 1859 07/30/24 1423   07/29/24 1654  vancomycin  (VANCOCIN ) powder  Status:  Discontinued          As needed 07/29/24 1655 07/29/24 1758   07/29/24 1515  ceFAZolin  (ANCEF ) IVPB 2g/100 mL premix        2 g 200 mL/hr over 30 Minutes Intravenous  Once 07/29/24 1421 07/29/24 1655         Subjective: Patient seen and examined.  Sitting at the edge of the bed, eating breakfast.  Fully alert and oriented.  Has no complaints.  Objective: Vitals:   10/23/24 2011 10/24/24  0019 10/24/24 0412 10/24/24 0744  BP: 114/88 106/75 104/75 (!) 91/46  Pulse: 61 (!) 57 (!) 51 (!) 51  Resp: 20 20 20 20   Temp: (!) 97.4 F (36.3 C) 97.9 F (36.6 C) 97.7 F (36.5 C) (!) 97.3 F (36.3 C)  TempSrc: Oral Oral Oral  SpO2: 95% 97% 97% 94%  Weight:   79.1 kg   Height:       No intake or output data in the 24 hours ending 10/24/24 0822   Filed Weights   10/22/24 0437 10/23/24 0432 10/24/24 0412  Weight: 82.3 kg 79.1 kg 79.1 kg    Examination:  General exam: Appears calm and comfortable  Respiratory system: Clear to auscultation. Respiratory effort normal. Cardiovascular system: S1 & S2 heard, RRR. No JVD, murmurs, rubs, gallops or clicks. No pedal edema. Gastrointestinal system: Abdomen is nondistended, soft and nontender. No organomegaly or masses felt. Normal bowel sounds heard. Central nervous system: Alert and oriented. No focal neurological deficits. Extremities: Symmetric 5 x 5 power. Skin: No rashes, lesions or ulcers   Data Reviewed: I have personally reviewed following labs and imaging studies  CBC: No results for input(s): WBC, NEUTROABS, HGB, HCT, MCV, PLT in the last 168 hours.  Basic Metabolic Panel: No results for input(s): NA, K, CL, CO2, GLUCOSE, BUN, CREATININE, CALCIUM, MG, PHOS in the last 168 hours.  GFR: Estimated Creatinine Clearance: 78.7 mL/min (by C-G formula based on SCr of 1.03 mg/dL). Liver Function Tests: No results for input(s): AST, ALT, ALKPHOS, BILITOT, PROT, ALBUMIN  in the last 168 hours. No results for input(s): LIPASE, AMYLASE in the last 168 hours. No results for input(s): AMMONIA in the last 168 hours. Coagulation Profile: No results for input(s): INR, PROTIME in the last 168 hours. Cardiac Enzymes: No results for input(s): CKTOTAL, CKMB, CKMBINDEX, TROPONINI in the last 168 hours. BNP (last 3 results) No results for input(s): PROBNP in the last 8760  hours. HbA1C: No results for input(s): HGBA1C in the last 72 hours. CBG: Recent Labs  Lab 10/20/24 2002  GLUCAP 119*    Lipid Profile: No results for input(s): CHOL, HDL, LDLCALC, TRIG, CHOLHDL, LDLDIRECT in the last 72 hours. Thyroid Function Tests: No results for input(s): TSH, T4TOTAL, FREET4, T3FREE, THYROIDAB in the last 72 hours. Anemia Panel: No results for input(s): VITAMINB12, FOLATE, FERRITIN, TIBC, IRON, RETICCTPCT in the last 72 hours. Sepsis Labs: No results for input(s): PROCALCITON, LATICACIDVEN in the last 168 hours.  No results found for this or any previous visit (from the past 240 hours).   Radiology Studies: No results found.  Scheduled Meds:  benztropine   1 mg Oral BID   clonazePAM   0.5 mg Oral TID   docusate  100 mg Oral Daily   enoxaparin  (LOVENOX ) injection  40 mg Subcutaneous Daily   feeding supplement  237 mL Oral BID BM   ferrous sulfate   325 mg Oral Q breakfast   folic acid   1 mg Oral Daily   gabapentin   200 mg Oral TID   mirtazapine   15 mg Oral QHS   multivitamin with minerals  1 tablet Oral Daily   nicotine   14 mg Transdermal Daily   pantoprazole   40 mg Oral QHS   polyethylene glycol  17 g Oral Daily   propranolol   20 mg Oral BID   senna  2 tablet Oral Daily   sodium chloride  flush  10-40 mL Intracatheter Q12H   thiamine   100 mg Oral Daily   valbenazine   40 mg Oral Daily   Continuous Infusions:   LOS: 101 days   Fredia Skeeter, MD [facility]  10/24/2024, 8:22 AM   *Please note that this is a verbal dictation therefore any spelling or grammatical errors are due to the Dragon Medical One system interpretation.  Please page via Amion and do not  message via secure chat for urgent patient care matters. Secure chat can be used for non urgent patient care matters.  How to contact the [facility] Attending or Consulting provider 7A - 7P or covering provider during after hours 7P -7A, for this  patient?  Check the care team in [facility] and look for a) attending/consulting [facility] provider listed and b) the [facility] team listed. Page or secure chat 7A-7P. Log into [email or web address] and use [facility] universal password to access. If you do not have the password, please contact the hospital operator. Locate the [facility] provider you are looking for under [facility] and page to a number that you can be directly reached. If you still have difficulty reaching the provider, please page the [facility] (Director on Call) for the Hospitalists listed on amion for assistance.  "

## 2024-10-24 NOTE — Plan of Care (Signed)
  Problem: Education: Goal: Knowledge of General Education information will improve Description: Including pain rating scale, medication(s)/side effects and non-pharmacologic comfort measures Outcome: Progressing   Problem: Health Behavior/Discharge Planning: Goal: Ability to manage health-related needs will improve Outcome: Progressing   Problem: Clinical Measurements: Goal: Ability to maintain clinical measurements within normal limits will improve Outcome: Progressing Goal: Will remain free from infection Outcome: Progressing Goal: Diagnostic test results will improve Outcome: Progressing Goal: Respiratory complications will improve Outcome: Progressing Goal: Cardiovascular complication will be avoided Outcome: Progressing   Problem: Activity: Goal: Risk for activity intolerance will decrease Outcome: Progressing   Problem: Nutrition: Goal: Adequate nutrition will be maintained Outcome: Progressing   Problem: Coping: Goal: Level of anxiety will decrease Outcome: Progressing   Problem: Elimination: Goal: Will not experience complications related to bowel motility Outcome: Progressing Goal: Will not experience complications related to urinary retention Outcome: Progressing   Problem: Pain Managment: Goal: General experience of comfort will improve and/or be controlled Outcome: Progressing   Problem: Safety: Goal: Ability to remain free from injury will improve Outcome: Progressing   Problem: Skin Integrity: Goal: Risk for impaired skin integrity will decrease Outcome: Progressing   Problem: Education: Goal: Knowledge of the prescribed therapeutic regimen will improve Outcome: Progressing   Problem: Clinical Measurements: Goal: Usual level of consciousness will be regained or maintained. Outcome: Progressing Goal: Neurologic status will improve Outcome: Progressing Goal: Ability to maintain intracranial pressure will improve Outcome: Progressing    Problem: Skin Integrity: Goal: Demonstration of wound healing without infection will improve Outcome: Progressing

## 2024-10-24 NOTE — TOC Progression Note (Signed)
 Transition of Care [facility]) - Progression Note    Patient Details  Name: Ernest Romero MRN: [id number] Date of Birth: [birth year]  [facility] CM/SW Contact  Luise JAYSON Pan, [place] Phone Number: 10/24/2024, 11:07 AM  Clinical Narrative:   CSW left VM for patients APS guardian [phone]), Nat, in regards to placement/overall plans for disposition.   CSW will continue to follow.    Expected Discharge Plan: Skilled Nursing Facility Barriers to Discharge: Continued Medical Work up, English As A Second Language Teacher, SNF Pending bed offer               Expected Discharge Plan and Services In-house Referral: Clinical Social Work Discharge Planning Services: CM Consult                                           Social Drivers of Health (SDOH) Interventions SDOH Screenings   Food Insecurity: Patient Unable To Answer (07/16/2024)  Housing: Unknown (07/16/2024)  Transportation Needs: Patient Unable To Answer (07/16/2024)  Utilities: Patient Unable To Answer (07/16/2024)  Alcohol Screen: Low Risk (08/30/2022)  Depression (PHQ2-9): Medium Risk (08/30/2022)  Tobacco Use: Medium Risk (08/12/2024)    Readmission Risk Interventions    07/16/2024   12:23 PM  Readmission Risk Prevention Plan  Transportation Screening Complete  Medication Review (RN Care Manager) Complete  PCP or Specialist appointment within 3-5 days of discharge Complete  HRI or Home Care Consult Complete  SW Recovery Care/Counseling Consult Complete  Palliative Care Screening Not Applicable  Skilled Nursing Facility Not Applicable

## 2024-10-25 NOTE — Progress Notes (Signed)
 " PROGRESS NOTE    Ernest Romero  FMW:[id number] DOB: August 30, 1966 DOA: 07/15/2024 PCP: [name]   Brief Narrative:  59 year old with past medical history significant for alcohol abuse, bipolar disorder, panic disorder presented secondary to being found down, he was found to have a large subdural hematoma, underwent left frontoparietal craniotomy with evacuation of the subdural hematoma and placement of a drain and subsequent removal.  Patient required further management of his craniotomy and flap which necessitated transfer from [place] to [facility] where neurosurgery performed the left side allograft cranioplasty.  Repeat CT head was significant with worsening fluid requiring left frontal burr hole for evacuation on 10/30.  Hospitalization further complicated by development of an infected bone flap with culture grew  MRSA.  Patient underwent left bone flap removal and was treated with IV antibiotics for 6 weeks per infectious disease recommendation he completed 2 weeks of oral vancomycin  to complete a total of 8 weeks of antibiotics on 1/6 He is currently ward of the state now through DSS.  Currently does not have any bed offers, difficult to place.    10/27 crani w/ flap replacement 10/29: off clevi, liberate SBP goal, Klonopin , lithium , Haldol  as needed discontinued.  Latuda  dose cut by half 10/30: repeat CT head with increased collection - going to OR this afternoon for evacuation 10/31: OR  for SDH evacuation.  Developed arm twitching.  Klonopin  restarted at half dose 11/1: More awake.  No complaint 11/2 transferred out of ICU. 11/4 cortrak removed 11/8 CT head showed fluid collection under bone flap, decision made to remove bone flap 11/10 to OR for L bone flap removal 11/11 repeat CT head stable 11/29: notice to have worsening Dysphagia.  12/1 repeat CT head: stable.  12/3: Cortrack placed for nutrition, sutures removed by neurosurgery.  Tube feeds  started. 12/4: Very sedated, psychiatry requested to readjust meds, reduced clonazepam  to0.5mg  BID, decreased valbenazine  to 40 mg daily 12/5: Noted oriented but jaw tremors worse.  Evaluated by neurology, recommended psych to continue to follow and adjust medications  Assessment & Plan:   Principal Problem:   Subdural hematoma (HCC) Active Problems:   Bipolar disorder (HCC)   Unable to make decisions about medical treatment due to impaired mental capacity   Alcohol withdrawal (HCC)   Essential hypertension   Protein-calorie malnutrition, severe   Acute metabolic encephalopathy   Infection of craniotomy plate  1-Recurrent subdural hematoma: - Patient underwent craniotomy and bur hole procedure per neurosurgery - Per neurosurgery patient could have reaccumulation of fluid.  No new neurological changes. Neurological stable.    MRSA infected bone flap - Infectious disease consulted and patient was treated with IV vancomycin  for 6 weeks completed 09/23/24. He was subsequently started on 2 weeks of doxycycline  which completed 1/6   Acute metabolic encephalopathy - Related to above, mental status is stable, he required intermittent safety sitter.  Nurse tells me today that patient is doing well, he knows he needs to wear helmet when up out of the bed, he has not fallen at all or has exhibited any signs of balance problem for more than a month according to the nurse and they are requesting to discontinue TeleSitter.  I have discontinued further request and information.   Bipolar disorder: Continue Ingressa, cogentin .    Drug-induced parkinsonism / Akathisia: Likely related  to use of antipsychotics, EEG obtained and was negative. Continue propranolol , Klonopin , and Ingrezza    Dysphagia: -Patient required NG tube for tube feeding from  12/3 until 12/8 -On  dysphagia 3 diet and doing well.   HTN: Continue propranolol , amlodipine  and losartan .  Blood pressure controlled.   Sinus  bradycardia: Complicated by propranolol , dose reduced hold the parameters   Severe malnutrition: Continue with supplement   Iron deficiency anemia: Continue with ferrous sulfate   Aspiration pneumonia: Completed IV Unasyn   Acute hypoxic respiratory failure secondary to pneumonia. Resolved   Alcohol use disorder, alcohol withdrawal Completed CIWA and phenobarbital  taper. Continue thiamine  folic acid  and multivitamin  DVT prophylaxis: enoxaparin  (LOVENOX ) injection 40 mg Start: 10/01/24 1400 Place TED hose Start: 08/12/24 1150 SCDs Start: 08/01/24 1745 Place and maintain sequential compression device Start: 07/29/24 1819   Code Status: Full Code  Family Communication:  None present at bedside.   Status is: Inpatient Remains inpatient appropriate because: Medically stable, pending placement   Estimated body mass index is 25.27 kg/m as calculated from the following:   Height as of this encounter: 5' 10 (1.778 m).   Weight as of this encounter: 79.9 kg.    Nutritional Assessment: Body mass index is 25.27 kg/m.SABRA Seen by dietician.  I agree with the assessment and plan as outlined below: Nutrition Status: Nutrition Problem: Severe Malnutrition Etiology: acute illness Signs/Symptoms: severe muscle depletion, moderate muscle depletion, percent weight loss Percent weight loss: 15 % (in 3 months) Interventions: Ensure Enlive (each supplement provides 350kcal and 20 grams of protein), MVI, Magic cup, Refer to RD note for recommendations  . Skin Assessment: I have examined the patient's skin and I agree with the wound assessment as performed by the wound care RN as outlined below:    Consultants:  Psychiatry, neurosurgery, neurology  Procedures:  Left-sided allograft cranioplasty Left frontal burr hole for evacuation of subdural hematoma Left bone flap removal Cortrak placement/removal  Antimicrobials:  Anti-infectives (From admission, onward)    Start     Dose/Rate  Route Frequency Ordered Stop   09/24/24 1000  doxycycline  (VIBRA -TABS) tablet 100 mg  Status:  Discontinued        100 mg Oral Every 12 hours 09/21/24 1455 09/23/24 1142   09/24/24 1000  doxycycline  (VIBRA -TABS) tablet 100 mg        100 mg Oral Every 12 hours 09/23/24 1142 10/07/24 2003   09/13/24 1000  vancomycin  (VANCOCIN ) IVPB 1000 mg/200 mL premix        1,000 mg 200 mL/hr over 60 Minutes Intravenous Every 12 hours 09/13/24 0835 09/24/24 0559   09/02/24 1700  Ampicillin -Sulbactam (UNASYN ) 3 g in sodium chloride  0.9 % 100 mL IVPB        3 g 200 mL/hr over 30 Minutes Intravenous Every 6 hours 09/02/24 1611 09/07/24 1230   08/31/24 1000  vancomycin  (VANCOREADY) IVPB 750 mg/150 mL  Status:  Discontinued        750 mg 150 mL/hr over 60 Minutes Intravenous Every 12 hours 08/31/24 0015 09/13/24 0835   08/21/24 0800  vancomycin  (VANCOCIN ) IVPB 1000 mg/200 mL premix  Status:  Discontinued        1,000 mg 200 mL/hr over 60 Minutes Intravenous Every 12 hours 08/21/24 0720 08/31/24 0015   08/19/24 0600  vancomycin  (VANCOREADY) IVPB 1250 mg/250 mL  Status:  Discontinued        1,250 mg 166.7 mL/hr over 90 Minutes Intravenous Every 12 hours 08/18/24 1818 08/21/24 0547   08/18/24 1830  vancomycin  (VANCOREADY) IVPB 500 mg/100 mL        500 mg 100 mL/hr over 60 Minutes Intravenous  Once 08/18/24 1817 08/19/24  1642   08/16/24 1600  vancomycin  (VANCOREADY) IVPB 750 mg/150 mL  Status:  Discontinued        750 mg 150 mL/hr over 60 Minutes Intravenous Every 8 hours 08/16/24 0828 08/18/24 1818   08/14/24 0800  vancomycin  (VANCOCIN ) IVPB 1000 mg/200 mL premix  Status:  Discontinued        1,000 mg 200 mL/hr over 60 Minutes Intravenous Every 8 hours 08/14/24 0016 08/16/24 0828   08/14/24 0030  vancomycin  (VANCOCIN ) IVPB 1000 mg/200 mL premix        1,000 mg 200 mL/hr over 60 Minutes Intravenous NOW 08/14/24 0016 08/14/24 0135   08/13/24 0100  vancomycin  (VANCOREADY) IVPB 750 mg/150 mL  Status:   Discontinued        750 mg 150 mL/hr over 60 Minutes Intravenous Every 12 hours 08/12/24 1230 08/14/24 0007   08/12/24 1400  ceFAZolin  (ANCEF ) IVPB 2g/100 mL premix  Status:  Discontinued        2 g 200 mL/hr over 30 Minutes Intravenous Every 8 hours 08/12/24 1149 08/12/24 1230   08/12/24 1400  cefTAZidime  (FORTAZ ) 2 g in sodium chloride  0.9 % 100 mL IVPB  Status:  Discontinued        2 g 200 mL/hr over 30 Minutes Intravenous Every 8 hours 08/12/24 1152 08/13/24 1109   08/12/24 1245  ceFAZolin  (ANCEF ) IVPB 2g/100 mL premix  Status:  Discontinued        2 g 200 mL/hr over 30 Minutes Intravenous  Once 08/12/24 1149 08/12/24 1449   08/12/24 1245  vancomycin  (VANCOREADY) IVPB 1250 mg/250 mL        1,250 mg 166.7 mL/hr over 90 Minutes Intravenous  Once 08/12/24 1152 08/12/24 1501   08/12/24 0957  vancomycin  (VANCOCIN ) powder  Status:  Discontinued          As needed 08/12/24 1008 08/12/24 1037   08/01/24 2200  ceFAZolin  (ANCEF ) IVPB 2g/100 mL premix        2 g 200 mL/hr over 30 Minutes Intravenous Every 8 hours 08/01/24 1703 08/02/24 1915   08/01/24 1530  vancomycin  (VANCOCIN ) powder  Status:  Discontinued          As needed 08/01/24 1559 08/01/24 1606   08/01/24 1421  ceFAZolin  (ANCEF ) 2-4 GM/100ML-% IVPB       Note to Pharmacy: Roslynn Birmingham: cabinet override      08/01/24 1421 08/02/24 0229   07/29/24 2200  ceFAZolin  (ANCEF ) IVPB 2g/100 mL premix        2 g 200 mL/hr over 30 Minutes Intravenous Every 8 hours 07/29/24 1859 07/30/24 1423   07/29/24 1654  vancomycin  (VANCOCIN ) powder  Status:  Discontinued          As needed 07/29/24 1655 07/29/24 1758   07/29/24 1515  ceFAZolin  (ANCEF ) IVPB 2g/100 mL premix        2 g 200 mL/hr over 30 Minutes Intravenous  Once 07/29/24 1421 07/29/24 1655         Subjective: Patient seen and examined.  Working with physical therapy, walking in the room.  Had some jaw tremors today after a few days.  He had no complaints.  Objective: Vitals:    10/24/24 1549 10/24/24 2038 10/25/24 0403 10/25/24 0739  BP: 99/73 119/85 104/76 (!) 119/93  Pulse: 64 69 (!) 54 (!) 58  Resp: 20 20 20 20   Temp: 98 F (36.7 C) 97.6 F (36.4 C) 98.6 F (37 C) (!) 97.4 F (36.3 C)  TempSrc:  Oral  Oral Oral  SpO2: 97% 97% 98% 96%  Weight:   79.9 kg   Height:        Intake/Output Summary (Last 24 hours) at 10/25/2024 1022 Last data filed at 10/25/2024 0900 Gross per 24 hour  Intake 536 ml  Output --  Net 536 ml     Filed Weights   10/23/24 0432 10/24/24 0412 10/25/24 0403  Weight: 79.1 kg 79.1 kg 79.9 kg    Examination:  General exam: Appears calm and comfortable  Respiratory system: Clear to auscultation. Respiratory effort normal. Cardiovascular system: S1 & S2 heard, RRR. No JVD, murmurs, rubs, gallops or clicks. No pedal edema. Gastrointestinal system: Abdomen is nondistended, soft and nontender. No organomegaly or masses felt. Normal bowel sounds heard. Central nervous system: Alert and oriented. No focal neurological deficits. Extremities: Symmetric 5 x 5 power. Skin: No rashes, lesions or ulcers   Data Reviewed: I have personally reviewed following labs and imaging studies  CBC: No results for input(s): WBC, NEUTROABS, HGB, HCT, MCV, PLT in the last 168 hours.  Basic Metabolic Panel: No results for input(s): NA, K, CL, CO2, GLUCOSE, BUN, CREATININE, CALCIUM, MG, PHOS in the last 168 hours.  GFR: Estimated Creatinine Clearance: 78.7 mL/min (by C-G formula based on SCr of 1.03 mg/dL). Liver Function Tests: No results for input(s): AST, ALT, ALKPHOS, BILITOT, PROT, ALBUMIN  in the last 168 hours. No results for input(s): LIPASE, AMYLASE in the last 168 hours. No results for input(s): AMMONIA in the last 168 hours. Coagulation Profile: No results for input(s): INR, PROTIME in the last 168 hours. Cardiac Enzymes: No results for input(s): CKTOTAL, CKMB, CKMBINDEX,  TROPONINI in the last 168 hours. BNP (last 3 results) No results for input(s): PROBNP in the last 8760 hours. HbA1C: No results for input(s): HGBA1C in the last 72 hours. CBG: Recent Labs  Lab 10/20/24 2002  GLUCAP 119*    Lipid Profile: No results for input(s): CHOL, HDL, LDLCALC, TRIG, CHOLHDL, LDLDIRECT in the last 72 hours. Thyroid Function Tests: No results for input(s): TSH, T4TOTAL, FREET4, T3FREE, THYROIDAB in the last 72 hours. Anemia Panel: No results for input(s): VITAMINB12, FOLATE, FERRITIN, TIBC, IRON, RETICCTPCT in the last 72 hours. Sepsis Labs: No results for input(s): PROCALCITON, LATICACIDVEN in the last 168 hours.  No results found for this or any previous visit (from the past 240 hours).   Radiology Studies: No results found.  Scheduled Meds:  benztropine   1 mg Oral BID   clonazePAM   0.5 mg Oral TID   docusate  100 mg Oral Daily   enoxaparin  (LOVENOX ) injection  40 mg Subcutaneous Daily   feeding supplement  237 mL Oral BID BM   ferrous sulfate   325 mg Oral Q breakfast   folic acid   1 mg Oral Daily   gabapentin   200 mg Oral TID   mirtazapine   15 mg Oral QHS   multivitamin with minerals  1 tablet Oral Daily   nicotine   14 mg Transdermal Daily   pantoprazole   40 mg Oral QHS   polyethylene glycol  17 g Oral Daily   propranolol   20 mg Oral BID   senna  2 tablet Oral Daily   thiamine   100 mg Oral Daily   valbenazine   40 mg Oral Daily   Continuous Infusions:   LOS: 102 days   Fredia Skeeter, MD [facility]  10/25/2024, 10:22 AM   *Please note that this is a verbal dictation therefore any spelling or grammatical errors are due to the  [employer] system interpretation.  Please page via Amion and do not message via secure chat for urgent patient care matters. Secure chat can be used for non urgent patient care matters.  How to contact the [facility] Attending or Consulting provider 7A - 7P or  covering provider during after hours 7P -7A, for this patient?  Check the care team in [facility] and look for a) attending/consulting [facility] provider listed and b) the [facility] team listed. Page or secure chat 7A-7P. Log into [email or web address] and use [place]'s universal password to access. If you do not have the password, please contact the hospital operator. Locate the [facility] provider you are looking for under [facility] and page to a number that you can be directly reached. If you still have difficulty reaching the provider, please page the [employer] (Director on Call) for the Hospitalists listed on amion for assistance.  "

## 2024-10-25 NOTE — Progress Notes (Signed)
 Occupational Therapy Treatment Patient Details Name: Ernest Romero MRN: [id number] DOB: Dec 12, 1965 Today's Date: 10/25/2024   History of present illness 59 y.o. male presenting 07/16/24 from [facility] where he was [facility]. 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 On CIWA protocol. Transferred to [facility] for further management of craniotomy and flap. 10/27 s/p cranioplasty with bone flap replacement. 10/28 Change in status thought to be related to Klonopin  administration 10/28, repeat CTH shows significant increase in size of mixed attenuation subdural fluid collection underlying the L tempoparietal craniotomy site, now measuring approximately 15 mm in thickness, with associated mild mass effect and slight rightward midline shift. OR on 10/30 for SDH evacuation. [facility] 11/7 with fluid collected under bone flap. 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 Worsening dysphagia 11/29; repeat CTH stable 12/1. Cortrak placed 12/3-12/8. PMH includes alcohol use disorder, psychiatric disorder, HTN.   OT comments  Pt making progress with functional goals. Pt in bed upon arrival and agreeable to work with OT. Session focused on ADL and ADL mobility sequencing and safety. Pt required one verbal cue to don helmet and to check poster for items he may need before walking for in room activity. Pt able to problem solve to gather items as instructed by OT with min vebal cues for redirection as pt becoming distracted. Pt stepped in in and out of shower with Sup with 1 verbal cue to use grab for safety and stood at sink for selfcare tasks and required CGA/mostly Sup during dynamic standing ADL tasks. Pt conitnues to demo cognitive deficits, but is making progress with functional goals.       If plan is discharge home, recommend the following:  Supervision due to cognitive status;Direct  supervision/assist for medications management;Assistance with cooking/housework;Direct supervision/assist for financial management;Assist for transportation;Assistance with feeding;A little help with walking and/or transfers;Help with stairs or ramp for entrance;A little help with bathing/dressing/bathroom   Equipment Recommendations  None recommended by OT    Recommendations for Other Services      Precautions / Restrictions Precautions Recall of Precautions/Restrictions: Impaired Precaution/Restrictions Comments: helmet (pt to wear when OOB) Restrictions Weight Bearing Restrictions Per Provider Order: No       Mobility Bed Mobility Overal bed mobility: Modified Independent Bed Mobility: Supine to Sit, Sit to Supine     Supine to sit: Modified independent (Device/Increase time) Sit to supine: Modified independent (Device/Increase time)        Transfers Overall transfer level: Needs assistance Equipment used: None Transfers: Sit to/from Stand Sit to Stand: Supervision     Step pivot transfers: Supervision     General transfer comment: pt able to problem solve to gather items as instructed by OT with min vebal cues for redirection as pt becoming distracted     Balance Overall balance assessment: Needs assistance Sitting-balance support: No upper extremity supported, Feet supported Sitting balance-Leahy Scale: Good     Standing balance support: No upper extremity supported, During functional activity Standing balance-Leahy Scale: Fair Standing balance comment: CGA with dynamic standing tasks during ADL/selfcare tasks and gathering of items in room                           ADL either performed or assessed with clinical judgement   ADL Overall ADL's : Needs assistance/impaired     Grooming: Wash/dry hands;Wash/dry face;Supervision/safety;Standing;Cueing  for sequencing Grooming Details (indicate cue type and reason): min verbal cues for sequencing to  use soap and problem solving for initiation to wash hands and face     Lower Body Bathing: Sit to/from stand;Contact guard assist;Supervison/ safety       Lower Body Dressing: Contact guard assist;Supervision/safety   Toilet Transfer: Supervision/safety;Ambulation;Regular Toilet   Toileting- Architect and Hygiene: Supervision/safety;Sit to/from stand       Functional mobility during ADLs: Supervision/safety;Contact guard assist General ADL Comments: mostly Sup during dynamic standing ADL tasks and to gather items around room    Extremity/Trunk Assessment Upper Extremity Assessment Upper Extremity Assessment: Right hand dominant;RUE deficits/detail;LUE deficits/detail RUE Deficits / Details: mildly decreased fine motor coordination; otherwise overall WFL for tasks assessed LUE Deficits / Details: mildly decreased coordination; otherwise overall WFL for tasks assessed   Lower Extremity Assessment Lower Extremity Assessment: Defer to PT evaluation        Vision Ability to See in Adequate Light: 0 Adequate Patient Visual Report: No change from baseline     Perception     Praxis     Communication Communication Communication: Impaired Factors Affecting Communication: Difficulty expressing self;Reduced clarity of speech   Cognition Arousal: Alert Behavior During Therapy: WFL for tasks assessed/performed Cognition: Cognition impaired           Executive functioning impairment (select all impairments): Organization, Sequencing, Reasoning, Problem solving OT - Cognition Comments: Pt conitnues to demo cognitive deficts, but is making progress with functional goals.One verbal cue required to don helmet and to check poster for items he may need before walking for in room activity                 Following commands: Impaired Following commands impaired: Only follows one step commands consistently, Follows multi-step commands with increased time       Cueing   Cueing Techniques: Verbal cues  Exercises      Shoulder Instructions       General Comments      Pertinent Vitals/ Pain       Pain Assessment Pain Assessment: Faces Faces Pain Scale: Hurts a little bit Pain Location: R hip initially Pain Descriptors / Indicators: Aching Pain Intervention(s): Monitored during session, Repositioned  Home Living                                          Prior Functioning/Environment              Frequency  Min 2X/week        Progress Toward Goals  OT Goals(current goals can now be found in the care plan section)  Progress towards OT goals: Progressing toward goals     Plan      Co-evaluation                 AM-PAC OT 6 Clicks Daily Activity     Outcome Measure   Help from another person eating meals?: None Help from another person taking care of personal grooming?: A Little Help from another person toileting, which includes using toliet, bedpan, or urinal?: A Little Help from another person bathing (including washing, rinsing, drying)?: A Little Help from another person to put on and taking off regular upper body clothing?: A Little Help from another person to put on and taking off regular lower body clothing?: A Little 6 Click Score: 19  End of Session Equipment Utilized During Treatment: Gait belt;Other (comment) (helmet)  OT Visit Diagnosis: Other abnormalities of gait and mobility (R26.89);Other symptoms and signs involving the nervous system (R29.898);Cognitive communication deficit (R41.841);Other symptoms and signs involving cognitive function   Activity Tolerance Patient tolerated treatment well   Patient Left in bed;with call bell/phone within reach;with bed alarm set   Nurse Communication Mobility status        Time: 8842-8782 OT Time Calculation (min): 20 min  Charges: OT General Charges $OT Visit: 1 Visit OT Treatments $Therapeutic Activity: 8-22  mins    Jacques Karna Loose 10/25/2024, 2:16 PM

## 2024-10-25 NOTE — Plan of Care (Signed)
 Ativan  given this morning for anxiety. Follows commands and able to redirect.   Problem: Clinical Measurements: Goal: Ability to maintain clinical measurements within normal limits will improve Outcome: Progressing Goal: Will remain free from infection Outcome: Progressing   Problem: Nutrition: Goal: Adequate nutrition will be maintained Outcome: Progressing   Problem: Education: Goal: Knowledge of General Education information will improve Description: Including pain rating scale, medication(s)/side effects and non-pharmacologic comfort measures Outcome: Not Progressing

## 2024-10-25 NOTE — Progress Notes (Signed)
 Physical Therapy Treatment Patient Details Name: Ernest Romero MRN: [id number] DOB: 10-06-1965 Today's Date: 10/25/2024   History of Present Illness 59 y.o. male presenting 07/16/24 from [facility] where he was admitted 06/30/24 for unresponsive episode in the jail. 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 On CIWA protocol. Transferred to [facility] for further management of craniotomy and flap. 10/27 s/p cranioplasty with bone flap replacement. 10/28 Change in status thought to be related to Klonopin  administration 10/28, repeat CTH shows significant increase in size of mixed attenuation subdural fluid collection underlying the L tempoparietal craniotomy site, now measuring approximately 15 mm in thickness, with associated mild mass effect and slight rightward midline shift. OR on 10/30 for SDH evacuation. [facility] 11/7 with fluid collected under bone flap. 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 Worsening dysphagia 11/29; repeat CTH stable 12/1. Cortrak placed 12/3-12/8. PMH includes alcohol use disorder, psychiatric disorder, HTN.    PT Comments  Pt received in supine and agreeable to session focused on high level balance tasks. Pt has increased difficulty with single leg stance and narrow BOS tasks, such as static standing marching and tandem walking requiring intermittent min A for balance. Pt demonstrates good balance and motor planning when stepping over obstacles. Pt instructed in seated BLE exercises and is encouraged to perform independently for strengthening. Pt continues to benefit from PT services to progress toward functional mobility goals.    If plan is discharge home, recommend the following: Assistance with cooking/housework;Direct supervision/assist for medications management;Direct supervision/assist for financial management;Assist for transportation;Help with stairs or ramp for entrance;Supervision due  to cognitive status;A little help with walking and/or transfers;A little help with bathing/dressing/bathroom   Can travel by private vehicle     Yes  Equipment Recommendations  None recommended by PT    Recommendations for Other Services       Precautions / Restrictions Precautions Precautions: Fall Recall of Precautions/Restrictions: Impaired Precaution/Restrictions Comments: SBP <160, helmet (pt to wear when OOB) Restrictions Weight Bearing Restrictions Per Provider Order: No     Mobility  Bed Mobility Overal bed mobility: Modified Independent                  Transfers Overall transfer level: Needs assistance Equipment used: None Transfers: Sit to/from Stand Sit to Stand: Supervision           General transfer comment: Supervision for safety    Ambulation/Gait Ambulation/Gait assistance: Contact guard assist, Supervision Gait Distance (Feet): 400 Feet Assistive device: None Gait Pattern/deviations: Step-through pattern, Decreased stride length, Narrow base of support       General Gait Details: Pt demonstrates stiff gait. cues for widened steps due to pt intermittently hitting other shoe during swing through causing instability wtih some improvement. able to perform head turns and step over obstacles with slight decrease in pace, but no LOB. Cues and facilitation of trunk rotation and arm swing with gait   Stairs             Wheelchair Mobility     Tilt Bed    Modified Rankin (Stroke Patients Only)       Balance Overall balance assessment: Needs assistance Sitting-balance support: No upper extremity supported, Feet supported Sitting balance-Leahy Scale: Good     Standing balance support: No upper extremity supported, During functional activity Standing balance-Leahy Scale: Fair Standing balance comment: CGA with dynamic challenges  High Level Balance Comments: head turns, stepping over obstacles, static standing  marches without UE support, tandem walking            Communication Communication Communication: Impaired Factors Affecting Communication: Difficulty expressing self;Reduced clarity of speech  Cognition Arousal: Alert Behavior During Therapy: WFL for tasks assessed/performed   PT - Cognitive impairments: Memory, Safety/Judgement, Attention                   Rancho Levels of Cognitive Functioning Rancho Los Amigos Scales of Cognitive Functioning: Automatic, Appropriate: Minimal Assistance for Daily Living Skills Rancho Los Amigos Scales of Cognitive Functioning: Automatic, Appropriate: Minimal Assistance for Daily Living Skills [VII]   Following commands: Impaired Following commands impaired: Only follows one step commands consistently, Follows multi-step commands with increased time    Cueing Cueing Techniques: Verbal cues  Exercises General Exercises - Lower Extremity Long Arc Quad: Strengthening, Both, 10 reps, Seated Hip Flexion/Marching: AROM, Seated, Both, 10 reps    General Comments        Pertinent Vitals/Pain Pain Assessment Pain Assessment: Faces Faces Pain Scale: Hurts a little bit Pain Location: R hip initially Pain Descriptors / Indicators: Aching, Sore Pain Intervention(s): Monitored during session     PT Goals (current goals can now be found in the care plan section) Acute Rehab PT Goals Patient Stated Goal: rehab PT Goal Formulation: With patient Time For Goal Achievement: 10/28/24 Progress towards PT goals: Progressing toward goals    Frequency    Min 2X/week       AM-PAC PT 6 Clicks Mobility   Outcome Measure  Help needed turning from your back to your side while in a flat bed without using bedrails?: None Help needed moving from lying on your back to sitting on the side of a flat bed without using bedrails?: None Help needed moving to and from a bed to a chair (including a wheelchair)?: A Little Help needed standing up from a  chair using your arms (e.g., wheelchair or bedside chair)?: A Little Help needed to walk in hospital room?: A Little Help needed climbing 3-5 steps with a railing? : A Little 6 Click Score: 20    End of Session Equipment Utilized During Treatment: Gait belt;Other (comment) (helmet) Activity Tolerance: Patient tolerated treatment well;Patient limited by fatigue Patient left: in bed;with call bell/phone within reach;with bed alarm set Nurse Communication: Mobility status PT Visit Diagnosis: Other abnormalities of gait and mobility (R26.89);Other symptoms and signs involving the nervous system (R29.898)     Time: 9052-8996 PT Time Calculation (min) (ACUTE ONLY): 16 min  Charges:    $Gait Training: 8-22 mins PT General Charges $$ ACUTE PT VISIT: 1 Visit                    Darryle George, PTA Acute Rehabilitation Services Secure Chat Preferred  [phone]    Darryle George 10/25/2024, 1:10 PM

## 2024-10-26 NOTE — Progress Notes (Signed)
 " PROGRESS NOTE    Ernest Romero  FMW:[id number] DOB: [birth year] DOA: 07/15/2024 PCP: [name]   Brief Narrative:  59 year old with past medical history significant for alcohol abuse, bipolar disorder, panic disorder presented secondary to being found down, he was found to have a large subdural hematoma, underwent left frontoparietal craniotomy with evacuation of the subdural hematoma and placement of a drain and subsequent removal.  Patient required further management of his craniotomy and flap which necessitated transfer from [facility] to [facility] where neurosurgery performed the left side allograft cranioplasty.  Repeat CT head was significant with worsening fluid requiring left frontal burr hole for evacuation on 10/30.  Hospitalization further complicated by development of an infected bone flap with culture grew  MRSA.  Patient underwent left bone flap removal and was treated with IV antibiotics for 6 weeks per infectious disease recommendation he completed 2 weeks of oral vancomycin  to complete a total of 8 weeks of antibiotics on 1/6 He is currently ward of the state now through DSS.  Currently does not have any bed offers, difficult to place.    10/27 crani w/ flap replacement 10/29: off clevi, liberate SBP goal, Klonopin , lithium , Haldol  as needed discontinued.  Latuda  dose cut by half 10/30: repeat CT head with increased collection - going to OR this afternoon for evacuation 10/31: OR  for SDH evacuation.  Developed arm twitching.  Klonopin  restarted at half dose 11/1: More awake.  No complaint 11/2 transferred out of ICU. 11/4 cortrak removed 11/8 CT head showed fluid collection under bone flap, decision made to remove bone flap 11/10 to OR for L bone flap removal 11/11 repeat CT head stable 11/29: notice to have worsening Dysphagia.  12/1 repeat CT head: stable.  12/3: Cortrack placed for nutrition, sutures removed by neurosurgery.  Tube feeds  started. 12/4: Very sedated, psychiatry requested to readjust meds, reduced clonazepam  to0.5mg  BID, decreased valbenazine  to 40 mg daily 12/5: Noted oriented but jaw tremors worse.  Evaluated by neurology, recommended psych to continue to follow and adjust medications  Assessment & Plan:   Principal Problem:   Subdural hematoma (HCC) Active Problems:   Bipolar disorder (HCC)   Unable to make decisions about medical treatment due to impaired mental capacity   Alcohol withdrawal (HCC)   Essential hypertension   Protein-calorie malnutrition, severe   Acute metabolic encephalopathy   Infection of craniotomy plate  1-Recurrent subdural hematoma: - Patient underwent craniotomy and bur hole procedure per neurosurgery - Per neurosurgery patient could have reaccumulation of fluid.  No new neurological changes. Neurological stable.    MRSA infected bone flap Resolved - Infectious disease consulted and patient was treated with IV vancomycin  for 6 weeks completed 09/23/24. He was subsequently started on 2 weeks of doxycycline  which completed 1/6   Acute metabolic encephalopathy - Related to above, mental status is stable, he required intermittent safety sitter.  Telesitter discontinued 1/23   Bipolar disorder: Continue Ingressa, cogentin .    Drug-induced parkinsonism / Akathisia: Likely related  to use of antipsychotics, EEG obtained and was negative. Continue propranolol , Klonopin , and Ingrezza    Dysphagia: -Patient required NG tube for tube feeding from 12/3 until 12/8 -On  dysphagia 3 diet and doing well.   HTN: Continue propranolol , amlodipine  and losartan .  Blood pressure controlled.   Sinus bradycardia: Complicated by propranolol , dose reduced     Severe malnutrition: Continue with supplement   Iron deficiency anemia: Continue with ferrous sulfate   Aspiration pneumonia: Completed IV Unasyn   Acute hypoxic respiratory failure secondary to pneumonia. Resolved   Alcohol use  disorder, alcohol withdrawal Completed CIWA and phenobarbital  taper. Continue thiamine  folic acid  and multivitamin  DVT prophylaxis: enoxaparin  (LOVENOX ) injection 40 mg Start: 10/01/24 1400 Place TED hose Start: 08/12/24 1150 SCDs Start: 08/01/24 1745 Place and maintain sequential compression device Start: 07/29/24 1819   Code Status: Full Code  Family Communication:  None present at bedside.   Status is: Inpatient Remains inpatient appropriate because: Medically stable, pending placement   Estimated body mass index is 25.31 kg/m as calculated from the following:   Height as of this encounter: 5' 10 (1.778 m).   Weight as of this encounter: 80 kg.    Nutritional Assessment: Body mass index is 25.31 kg/m.SABRA Seen by dietician.  I agree with the assessment and plan as outlined below: Nutrition Status: Nutrition Problem: Severe Malnutrition Etiology: acute illness Signs/Symptoms: severe muscle depletion, moderate muscle depletion, percent weight loss Percent weight loss: 15 % (in 3 months) Interventions: Ensure Enlive (each supplement provides 350kcal and 20 grams of protein), MVI, Magic cup, Refer to RD note for recommendations  . Skin Assessment: I have examined the patient's skin and I agree with the wound assessment as performed by the wound care RN as outlined below:    Consultants:  Psychiatry, neurosurgery, neurology  Procedures:  Left-sided allograft cranioplasty Left frontal burr hole for evacuation of subdural hematoma Left bone flap removal Cortrak placement/removal  Antimicrobials:  Anti-infectives (From admission, onward)    Start     Dose/Rate Route Frequency Ordered Stop   09/24/24 1000  doxycycline  (VIBRA -TABS) tablet 100 mg  Status:  Discontinued        100 mg Oral Every 12 hours 09/21/24 1455 09/23/24 1142   09/24/24 1000  doxycycline  (VIBRA -TABS) tablet 100 mg        100 mg Oral Every 12 hours 09/23/24 1142 10/07/24 2003   09/13/24 1000   vancomycin  (VANCOCIN ) IVPB 1000 mg/200 mL premix        1,000 mg 200 mL/hr over 60 Minutes Intravenous Every 12 hours 09/13/24 0835 09/24/24 0559   09/02/24 1700  Ampicillin -Sulbactam (UNASYN ) 3 g in sodium chloride  0.9 % 100 mL IVPB        3 g 200 mL/hr over 30 Minutes Intravenous Every 6 hours 09/02/24 1611 09/07/24 1230   08/31/24 1000  vancomycin  (VANCOREADY) IVPB 750 mg/150 mL  Status:  Discontinued        750 mg 150 mL/hr over 60 Minutes Intravenous Every 12 hours 08/31/24 0015 09/13/24 0835   08/21/24 0800  vancomycin  (VANCOCIN ) IVPB 1000 mg/200 mL premix  Status:  Discontinued        1,000 mg 200 mL/hr over 60 Minutes Intravenous Every 12 hours 08/21/24 0720 08/31/24 0015   08/19/24 0600  vancomycin  (VANCOREADY) IVPB 1250 mg/250 mL  Status:  Discontinued        1,250 mg 166.7 mL/hr over 90 Minutes Intravenous Every 12 hours 08/18/24 1818 08/21/24 0547   08/18/24 1830  vancomycin  (VANCOREADY) IVPB 500 mg/100 mL        500 mg 100 mL/hr over 60 Minutes Intravenous  Once 08/18/24 1817 08/19/24 1642   08/16/24 1600  vancomycin  (VANCOREADY) IVPB 750 mg/150 mL  Status:  Discontinued        750 mg 150 mL/hr over 60 Minutes Intravenous Every 8 hours 08/16/24 0828 08/18/24 1818   08/14/24 0800  vancomycin  (VANCOCIN ) IVPB 1000 mg/200 mL premix  Status:  Discontinued  1,000 mg 200 mL/hr over 60 Minutes Intravenous Every 8 hours 08/14/24 0016 08/16/24 0828   08/14/24 0030  vancomycin  (VANCOCIN ) IVPB 1000 mg/200 mL premix        1,000 mg 200 mL/hr over 60 Minutes Intravenous NOW 08/14/24 0016 08/14/24 0135   08/13/24 0100  vancomycin  (VANCOREADY) IVPB 750 mg/150 mL  Status:  Discontinued        750 mg 150 mL/hr over 60 Minutes Intravenous Every 12 hours 08/12/24 1230 08/14/24 0007   08/12/24 1400  ceFAZolin  (ANCEF ) IVPB 2g/100 mL premix  Status:  Discontinued        2 g 200 mL/hr over 30 Minutes Intravenous Every 8 hours 08/12/24 1149 08/12/24 1230   08/12/24 1400  cefTAZidime   (FORTAZ ) 2 g in sodium chloride  0.9 % 100 mL IVPB  Status:  Discontinued        2 g 200 mL/hr over 30 Minutes Intravenous Every 8 hours 08/12/24 1152 08/13/24 1109   08/12/24 1245  ceFAZolin  (ANCEF ) IVPB 2g/100 mL premix  Status:  Discontinued        2 g 200 mL/hr over 30 Minutes Intravenous  Once 08/12/24 1149 08/12/24 1449   08/12/24 1245  vancomycin  (VANCOREADY) IVPB 1250 mg/250 mL        1,250 mg 166.7 mL/hr over 90 Minutes Intravenous  Once 08/12/24 1152 08/12/24 1501   08/12/24 0957  vancomycin  (VANCOCIN ) powder  Status:  Discontinued          As needed 08/12/24 1008 08/12/24 1037   08/01/24 2200  ceFAZolin  (ANCEF ) IVPB 2g/100 mL premix        2 g 200 mL/hr over 30 Minutes Intravenous Every 8 hours 08/01/24 1703 08/02/24 1915   08/01/24 1530  vancomycin  (VANCOCIN ) powder  Status:  Discontinued          As needed 08/01/24 1559 08/01/24 1606   08/01/24 1421  ceFAZolin  (ANCEF ) 2-4 GM/100ML-% IVPB       Note to Pharmacy: Roslynn Birmingham: cabinet override      08/01/24 1421 08/02/24 0229   07/29/24 2200  ceFAZolin  (ANCEF ) IVPB 2g/100 mL premix        2 g 200 mL/hr over 30 Minutes Intravenous Every 8 hours 07/29/24 1859 07/30/24 1423   07/29/24 1654  vancomycin  (VANCOCIN ) powder  Status:  Discontinued          As needed 07/29/24 1655 07/29/24 1758   07/29/24 1515  ceFAZolin  (ANCEF ) IVPB 2g/100 mL premix        2 g 200 mL/hr over 30 Minutes Intravenous  Once 07/29/24 1421 07/29/24 1655         Subjective: Patient seen and examined.  Resting in bed. No complaints. BM today  Objective: Vitals:   10/25/24 2329 10/26/24 0500 10/26/24 0743 10/26/24 1221  BP:   117/84 125/86  Pulse:   (!) 57 79  Resp: 20  18   Temp:   (!) 97.3 F (36.3 C)   TempSrc:      SpO2:   96% 94%  Weight:  80 kg    Height:       No intake or output data in the 24 hours ending 10/26/24 1308    Filed Weights   10/24/24 0412 10/25/24 0403 10/26/24 0500  Weight: 79.1 kg 79.9 kg 80 kg     Examination:  General exam: Appears calm and comfortable  Respiratory system: Clear to auscultation. Respiratory effort normal. Cardiovascular system: S1 & S2 heard, RRR.  Gastrointestinal system: Abdomen is  nondistended, soft and nontender.   Central nervous system: Alert and oriented. Jaw tremor Extremities: Symmetric 5 x 5 power. Skin: No visible rashes, lesions or ulcers   Data Reviewed: I have personally reviewed following labs and imaging studies  CBC: No results for input(s): WBC, NEUTROABS, HGB, HCT, MCV, PLT in the last 168 hours.  Basic Metabolic Panel: No results for input(s): NA, K, CL, CO2, GLUCOSE, BUN, CREATININE, CALCIUM, MG, PHOS in the last 168 hours.  GFR: Estimated Creatinine Clearance: 78.7 mL/min (by C-G formula based on SCr of 1.03 mg/dL). Liver Function Tests: No results for input(s): AST, ALT, ALKPHOS, BILITOT, PROT, ALBUMIN  in the last 168 hours. No results for input(s): LIPASE, AMYLASE in the last 168 hours. No results for input(s): AMMONIA in the last 168 hours. Coagulation Profile: No results for input(s): INR, PROTIME in the last 168 hours. Cardiac Enzymes: No results for input(s): CKTOTAL, CKMB, CKMBINDEX, TROPONINI in the last 168 hours. BNP (last 3 results) No results for input(s): PROBNP in the last 8760 hours. HbA1C: No results for input(s): HGBA1C in the last 72 hours. CBG: Recent Labs  Lab 10/20/24 2002  GLUCAP 119*    Lipid Profile: No results for input(s): CHOL, HDL, LDLCALC, TRIG, CHOLHDL, LDLDIRECT in the last 72 hours. Thyroid Function Tests: No results for input(s): TSH, T4TOTAL, FREET4, T3FREE, THYROIDAB in the last 72 hours. Anemia Panel: No results for input(s): VITAMINB12, FOLATE, FERRITIN, TIBC, IRON, RETICCTPCT in the last 72 hours. Sepsis Labs: No results for input(s): PROCALCITON, LATICACIDVEN in the last 168  hours.  No results found for this or any previous visit (from the past 240 hours).   Radiology Studies: No results found.  Scheduled Meds:  benztropine   1 mg Oral BID   clonazePAM   0.5 mg Oral TID   docusate  100 mg Oral Daily   enoxaparin  (LOVENOX ) injection  40 mg Subcutaneous Daily   feeding supplement  237 mL Oral BID BM   ferrous sulfate   325 mg Oral Q breakfast   folic acid   1 mg Oral Daily   gabapentin   200 mg Oral TID   mirtazapine   15 mg Oral QHS   multivitamin with minerals  1 tablet Oral Daily   nicotine   14 mg Transdermal Daily   pantoprazole   40 mg Oral QHS   polyethylene glycol  17 g Oral Daily   propranolol   20 mg Oral BID   senna  2 tablet Oral Daily   thiamine   100 mg Oral Daily   valbenazine   40 mg Oral Daily   Continuous Infusions:   LOS: 103 days   Devaughn KATHEE Ban, MD [facility]  10/26/2024, 1:08 PM     Please page via Amion and do not message via secure chat for urgent patient care matters. Secure chat can be used for non urgent patient care matters.  How to contact the [facility] Attending or Consulting provider 7A - 7P or covering provider during after hours 7P -7A, for this patient?  Check the care team in [facility] and look for a) attending/consulting [facility] provider listed and b) the [facility] team listed. Page or secure chat 7A-7P. Log into [email or web address] and use [facility] universal password to access. If you do not have the password, please contact the hospital operator. Locate the [facility] provider you are looking for under [facility] and page to a number that you can be directly reached. If you still have difficulty reaching the provider, please page the [facility] (Director on Call) for the  Hospitalists listed on amion for assistance.  "

## 2024-10-26 NOTE — Progress Notes (Signed)
 Physical Therapy Treatment Patient Details Name: Ernest Romero MRN: [id number] DOB: Dec 22, 1965 Today's Date: 10/26/2024   History of Present Illness 59 y.o. male presenting 07/16/24 from [facility] where he was admitted 06/30/[facility]. 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 On CIWA protocol. Transferred to [facility] for further management of craniotomy and flap. 10/27 s/p cranioplasty with bone flap replacement. 10/28 Change in status thought to be related to Klonopin  administration 10/28, repeat CTH shows significant increase in size of mixed attenuation subdural fluid collection underlying the L tempoparietal craniotomy site, now measuring approximately 15 mm in thickness, with associated mild mass effect and slight rightward midline shift. OR on 10/30 for SDH evacuation. [facility] 11/7 with fluid collected under bone flap. 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 Worsening dysphagia 11/29; repeat CTH stable 12/1. Cortrak placed 12/3-12/8. PMH includes alcohol use disorder, psychiatric disorder, HTN.    PT Comments  Pt received in supine and agreeable to PT session. Limited session as pt reported being frustrated about having to wear a helmet with OOB activity. Pt was agreeable to ambulate in the hall and perform a few dynamic balance activities. MinA needed to provide steadying assist when stepping over obstacles and walking forwards/backwards with heel/toe pattern. Pt was also able to perform standing hygiene tasks at the sink with no cueing needed. Will continue to follow acutely.    If plan is discharge home, recommend the following: Assistance with cooking/housework;Direct supervision/assist for medications management;Direct supervision/assist for financial management;Assist for transportation;Help with stairs or ramp for entrance;Supervision due to cognitive status;A little help with  walking and/or transfers;A little help with bathing/dressing/bathroom   Can travel by private vehicle     Yes  Equipment Recommendations  None recommended by PT       Precautions / Restrictions Precautions Precautions: Fall Recall of Precautions/Restrictions: Impaired Precaution/Restrictions Comments: helmet (pt to wear when OOB) Restrictions Weight Bearing Restrictions Per Provider Order: No     Mobility  Bed Mobility Overal bed mobility: Modified Independent Bed Mobility: Supine to Sit    Supine to sit: Modified independent (Device/Increase time)     Transfers Overall transfer level: Needs assistance Equipment used: None Transfers: Sit to/from Stand Sit to Stand: Supervision    General transfer comment: supervision for safety    Ambulation/Gait Ambulation/Gait assistance: Contact guard assist, Supervision, Min assist Gait Distance (Feet): 400 Feet Assistive device: None Gait Pattern/deviations: Step-through pattern, Decreased stride length, Narrow base of support Gait velocity: slightly decreased    General Gait Details: Stiff gait pattern with minimal arm swing. Slight L knee flexion when during loading response. MinA with heel/toe gait pattern for unsteadiness      Balance Overall balance assessment: Needs assistance Sitting-balance support: No upper extremity supported, Feet supported Sitting balance-Leahy Scale: Good    Standing balance support: No upper extremity supported, During functional activity Standing balance-Leahy Scale: Fair Standing balance comment: CGA/MinA with dynamic challenges    High Level Balance Comments: Stepping over obstacles and forwards/backwards heel/toe gait       Communication Communication Communication: Impaired Factors Affecting Communication: Difficulty expressing self;Reduced clarity of speech  Cognition Arousal: Alert Behavior During Therapy: WFL for tasks assessed/performed   PT - Cognitive impairments: Memory,  Safety/Judgement, Attention    Rancho Levels of Cognitive Functioning Ranch[place]igos Scales of Cognitive Functioning: Automatic, Appropriate: Minimal Assistance for Daily Living Skills [place] Scales  of Cognitive Functioning: Automatic, Appropriate: Minimal Assistance for Daily Living Skills [VII] PT - Cognition Comments: Is tired of being at the hospital and does not want to have to wear a helmet with OOB Following commands: Impaired Following commands impaired: Only follows one step commands consistently, Follows multi-step commands with increased time    Cueing Cueing Techniques: Verbal cues, Visual cues         Pertinent Vitals/Pain Pain Assessment Pain Assessment: No/denies pain     PT Goals (current goals can now be found in the care plan section) Acute Rehab PT Goals PT Goal Formulation: With patient Time For Goal Achievement: 10/28/24 Potential to Achieve Goals: Fair Progress towards PT goals: Progressing toward goals    Frequency    Min 2X/week       AM-PAC PT 6 Clicks Mobility   Outcome Measure  Help needed turning from your back to your side while in a flat bed without using bedrails?: None Help needed moving from lying on your back to sitting on the side of a flat bed without using bedrails?: None Help needed moving to and from a bed to a chair (including a wheelchair)?: A Little Help needed standing up from a chair using your arms (e.g., wheelchair or bedside chair)?: A Little Help needed to walk in hospital room?: A Little Help needed climbing 3-5 steps with a railing? : A Little 6 Click Score: 20    End of Session Equipment Utilized During Treatment: Gait belt;Other (comment) (helmet) Activity Tolerance: Patient tolerated treatment well Patient left: in bed;with call bell/phone within reach;with bed alarm set Nurse Communication: Mobility status (pt seated on EOB) PT Visit Diagnosis: Other abnormalities of gait and mobility  (R26.89);Other symptoms and signs involving the nervous system (R29.898)     Time: 8976-8957 PT Time Calculation (min) (ACUTE ONLY): 19 min  Charges:    $Therapeutic Activity: 8-22 mins PT General Charges $$ ACUTE PT VISIT: 1 Visit                    Kate ORN, PT, DPT Secure Chat Preferred  Rehab Office [phone]   Kate BRAVO Wendolyn 10/26/2024, 12:37 PM

## 2024-10-27 NOTE — Plan of Care (Signed)
   Problem: Education: Goal: Knowledge of General Education information will improve Description Including pain rating scale, medication(s)/side effects and non-pharmacologic comfort measures Outcome: Progressing

## 2024-10-27 NOTE — Plan of Care (Signed)
  Problem: Education: Goal: Knowledge of General Education information will improve Description: Including pain rating scale, medication(s)/side effects and non-pharmacologic comfort measures Outcome: Progressing   Problem: Health Behavior/Discharge Planning: Goal: Ability to manage health-related needs will improve Outcome: Progressing   Problem: Clinical Measurements: Goal: Ability to maintain clinical measurements within normal limits will improve Outcome: Progressing Goal: Will remain free from infection Outcome: Progressing Goal: Diagnostic test results will improve Outcome: Progressing Goal: Respiratory complications will improve Outcome: Progressing Goal: Cardiovascular complication will be avoided Outcome: Progressing   Problem: Activity: Goal: Risk for activity intolerance will decrease Outcome: Progressing   Problem: Nutrition: Goal: Adequate nutrition will be maintained Outcome: Progressing   Problem: Coping: Goal: Level of anxiety will decrease Outcome: Progressing   Problem: Elimination: Goal: Will not experience complications related to bowel motility Outcome: Progressing Goal: Will not experience complications related to urinary retention Outcome: Progressing   Problem: Pain Managment: Goal: General experience of comfort will improve and/or be controlled Outcome: Progressing   Problem: Safety: Goal: Ability to remain free from injury will improve Outcome: Progressing   Problem: Skin Integrity: Goal: Risk for impaired skin integrity will decrease Outcome: Progressing   Problem: Education: Goal: Knowledge of the prescribed therapeutic regimen will improve Outcome: Progressing   Problem: Clinical Measurements: Goal: Usual level of consciousness will be regained or maintained. Outcome: Progressing Goal: Neurologic status will improve Outcome: Progressing Goal: Ability to maintain intracranial pressure will improve Outcome: Progressing    Problem: Skin Integrity: Goal: Demonstration of wound healing without infection will improve Outcome: Progressing

## 2024-10-27 NOTE — Progress Notes (Signed)
 " Progress Note   Patient: Ernest Romero FMW:[id number] DOB: [birth year] DOA: 07/15/2024     104 DOS: the patient was seen and examined on 10/27/2024   Brief hospital course: Ernest Romero is a 59 y.o. male with a history of alcohol abuse, bipolar disorder, panic disorder.  Patient presented secondary to being found down and found to have evidence of a large subdural hematoma, undergoing a left frontoparietal craniotomy with evacuation of the subdural hematoma and placement of a drain and subsequent removal. Patient required further management of his craniotomy and flap, which necessitated transfer from [facility] to [facility], where neurosurgery performed a left-sided allograft cranioplasty. Repeat CT head was significant for worsening fluid, requiring a left frontal burr hole for evacuation on 10/30. 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 Patient underwent a left bone flap removal and was treated with IV antibiotics per infectious disease recommendations on 10/08/2024 for total of 8 weeks of antibiotics.  Patient is currently a ward of the state with DSS and does not have any bed offers and is difficult to place.  10/27 crani w/ flap replacement 10/29: off clevi, liberate SBP goal, Klonopin , lithium , Haldol  as needed discontinued.  Latuda  dose cut by half 10/30: repeat CT head with increased collection - going to OR this afternoon for evacuation 10/31: OR  for SDH evacuation.  Developed arm twitching.  Klonopin  restarted at half dose 11/1: More awake.  No complaint 11/2 transferred out of ICU. 11/4 cortrak removed 11/8 CT head showed fluid collection under bone flap, decision made to remove bone flap 11/10 to OR for L bone flap removal 11/11 repeat CT head stable 11/29: notice to have worsening Dysphagia.  12/1 repeat CT head: stable.  12/3: Cortrack placed for nutrition, sutures removed by  neurosurgery.  Tube feeds started. 12/4: Very sedated, psychiatry requested to readjust meds, reduced clonazepam  to0.5mg  BID, decreased valbenazine  to 40 mg daily 12/5: Noted oriented but jaw tremors worse.  Evaluated by neurology, recommended psych to continue to follow and adjust medications  Assessment and Plan:  #) Recurrent subdural hematoma: See above for procedures, underwent craniectomy and bur hole procedure.  Appears to be neurologically stable  #) MRSA infected bone flap: Completed total of 8 weeks of antibiotics, IV vancomycin  completed on 09/23/2024 and additional 2 weeks of doxycycline  completed on 10/18/2024.  #) Bolick encephalopathy: This appears to have improved and mental status is stable.  #) Bipolar disorder: - Continue bowel penicillin 40 mg daily, benztropine  1 mg twice daily  #) Drug-induced parkinsonism/akathisia: Secondary to antipsychotic use.  No evidence of seizure. - Continue propranolol  20 mg twice daily, clonazepam  0.5 mg 3 times daily, valbenazine   #) Dysphagia: Required NG tube feeding from 12/3 to 09/09/2024 - Continue dysphagia 3 diet  #) Hypertension: - Continue propranolol  20 mg twice daily, amlodipine , losartan   #) Iron deficiency anemia: - Continue oral iron supplementation  #) Acute hypoxic respiratory failure secondary to aspiration pneumonia: Completed a course of Unasyn   Fluids: None Electrolytes: Check intermittently Nutrition: Dysphagia 3 diet  Prophylaxis: Enoxaparin  40 mg  Disposition: Complex, ward of the state with DSS, Patient's support counselor Ernest Romero has been visiting intermittently. [phone])  Full code        Subjective: Minimally interactive, sleeping in bed  Physical Exam: Vitals:   10/27/24 0011 10/27/24 0346 10/27/24 0814 10/27/24 1222  BP: 103/75 122/74 115/82 103/69  Pulse: 61 (!) 54 68 61  Resp: 18 17    Temp: 97.6 F (36.4 C) 97.6 F (36.4 C) 98.1 F (36.7 C) 97.8 F (36.6 C)  TempSrc:  Oral Oral    SpO2: 96% 96% 97% 97%  Weight:  81 kg    Height:       General Appearance:  in no acute distress Skin:  there are no suspicious lesions or rashes of concern Head/face: Incisions Eyes:  No gross abnormalities. Ears:  exam deferred Nose/Sinuses:  negative Mouth/Throat:  Mucosa moist, no lesions; pharynx without erythema, edema or exudate. Neck:  neck- supple, no mass, non-tender Lungs:  Normal expansion.  Clear to auscultation.  No rales, rhonchi, or wheezing. Heart:  Heart sounds are normal.  Regular rate and rhythm without murmur, gallop or rub. Abdomen:  Soft, non-tender, normal bowel sounds; no bruits, organomegaly or masses. Extremities: no edema Musculoskeletal:  negative Peripheral Pulses: Deferred Neurologic:  negative Psych exam: Sleeping, deferred  Data Reviewed:  There are no new results to review at this time.  Family Communication: Patient's support counselor Ernest Romero has been visiting intermittently. [phone])  Disposition: Status is: Inpatient Remains inpatient appropriate because: Difficult placement with complex social situation, ward of the state with DSS  Planned Discharge Destination: Barriers to discharge: Ward of the state with DSS     Author: Maybelle JAYSON Pelton, MD 10/27/2024 12:33 PM  For on call review [email or web address].  "

## 2024-10-28 NOTE — Progress Notes (Signed)
 Telephone call to [name] with [facility] [phone]) for an update. She stated that the placement coordinator is looking for placement, coordination emails and working with the CM in the community for assistance. Currently, no bed available. Kyra also stated that with TBI as a diagnosis, pt will be able to obtain more resources in the community. ICM team will continue to progress the patient and work with [facility] for placement. KATHEE Herring [facility] Inpatient Care Management Supervisor [facility] [phone]

## 2024-10-28 NOTE — NC FL2 (Addendum)
 " [place]  MEDICAID FL2 LEVEL OF CARE FORM     IDENTIFICATION  Patient Name: Ernest Romero Birthdate: 04-Apr-1966 Sex: male Admission Date (Current Location): 07/15/2024  [place] and [place] Number:  [name] [id number] Facility and Address:  [facility], [address], [place], [place] [address]      Provider Number: [id number]  Attending Physician Name and Address:  Sabina Maybelle BROCKS, MD  Relative Name and Phone Number:  [name], friend - [phone]    Current Level of Care: Hospital Recommended Level of Care: Family Care Home Prior Approval Number:    Date Approved/Denied:   PASRR Number:    Discharge Plan: Other (Comment) (Group Home, Family Care Home)    Current Diagnoses: Patient Active Problem List   Diagnosis Date Noted   Infection of craniotomy plate 88/89/7974   Acute metabolic encephalopathy 08/05/2024   Protein-calorie malnutrition, severe 07/31/2024   Unable to make decisions about medical treatment due to impaired mental capacity 07/25/2024   Alcohol withdrawal (HCC) 07/02/2024   Hypophosphatemia 07/02/2024   Subdural hematoma (HCC) 06/30/2024   Behavior concern in adult 04/21/2024   Drug-seeking behavior 03/02/2024   Polysubstance abuse (HCC) 03/02/2024   Delusions (HCC) 04/15/2023   Cocaine abuse with cocaine-induced psychotic disorder, with delusions (HCC) 04/15/2023   Overweight with body mass index (BMI) of 28 to 28.9 in adult 08/30/2022   Essential hypertension 09/17/2018   Bipolar disorder (HCC) 04/22/2013    Traumatic Brain Injury 10/28/2024    Orientation RESPIRATION BLADDER Height & Weight     Self  Normal Continent Weight: 177 lb 11.1 oz (80.6 kg) Height:  5' 10 (177.8 cm)  BEHAVIORAL SYMPTOMS/MOOD NEUROLOGICAL BOWEL NUTRITION STATUS      Continent Diet (please refer to dc summary)  AMBULATORY STATUS COMMUNICATION OF NEEDS Skin   Supervision Verbally Other (Comment) (closed surgical flap, upper left side  of head)                       Personal Care Assistance Level of Assistance  Bathing, Feeding, Dressing Bathing Assistance: Limited assistance Feeding assistance: Independent Dressing Assistance: Limited assistance     Functional Limitations Info  Sight, Hearing, Speech Sight Info: Adequate Hearing Info: Adequate Speech Info: Adequate    SPECIAL CARE FACTORS FREQUENCY  PT (By licensed PT), OT (By licensed OT)     PT Frequency: 5x/week OT Frequency: 5x/week            Contractures Contractures Info: Not present    Additional Factors Info  Code Status, Allergies Code Status Info: Full code Allergies Info: NKA Psychotropic Info: Haldol          Current Medications (10/28/2024):  This is the current hospital active medication list Current Facility-Administered Medications  Medication Dose Route Frequency Provider Last Rate Last Admin   acetaminophen  (TYLENOL ) tablet 650 mg  650 mg Oral Q4H PRN Hongalgi, Anand D, MD   650 mg at 10/25/24 2117   Or   acetaminophen  (TYLENOL ) suppository 650 mg  650 mg Rectal Q4H PRN Hongalgi, Anand D, MD       benztropine  (COGENTIN ) tablet 1 mg  1 mg Oral BID Starkes-Perry, Takia S, FNP   1 mg at 10/28/24 9177   clonazePAM  (KLONOPIN ) tablet 0.5 mg  0.5 mg Oral TID Rai, Ripudeep K, MD   0.5 mg at 10/28/24 9177   docusate (COLACE) 50 MG/5ML liquid 100 mg  100 mg Oral Daily Hongalgi, Anand D, MD  100 mg at 10/17/24 0916   enoxaparin  (LOVENOX ) injection 40 mg  40 mg Subcutaneous Daily Pokhrel, Laxman, MD   40 mg at 10/28/24 0824   feeding supplement (ENSURE PLUS HIGH PROTEIN) liquid 237 mL  237 mL Oral BID BM Danford, Lonni SQUIBB, MD   237 mL at 10/28/24 1346   ferrous sulfate  tablet 325 mg  325 mg Oral Q breakfast Regalado, Belkys A, MD   325 mg at 10/28/24 9370   folic acid  (FOLVITE ) tablet 1 mg  1 mg Oral Daily Ogbata, Sylvester I, MD   1 mg at 10/28/24 9176   gabapentin  (NEURONTIN ) capsule 200 mg  200 mg Oral TID Jonel Lonni SQUIBB, MD   200 mg at 10/28/24 9177   guaiFENesin -dextromethorphan  (ROBITUSSIN DM) 100-10 MG/5ML syrup 5 mL  5 mL Oral Q4H PRN Pudota, Kingsley P, MD   5 mL at 09/19/24 1615   labetalol  (NORMODYNE ) injection 10-40 mg  10-40 mg Intravenous Q10 min PRN Janjua, Rashid M, MD       LORazepam  (ATIVAN ) tablet 1 mg  1 mg Oral Q6H PRN Sreeram, Narendranath, MD   1 mg at 10/25/24 1032   magic mouthwash w/lidocaine   5 mL Oral TID PRN Regalado, Belkys A, MD   5 mL at 09/03/24 1502   melatonin tablet 10 mg  10 mg Oral QHS PRN Shalhoub, George J, MD   10 mg at 10/27/24 2245   mirtazapine  (REMERON ) tablet 15 mg  15 mg Oral QHS McCarty, Artie, MD   15 mg at 10/27/24 2242   multivitamin with minerals tablet 1 tablet  1 tablet Oral Daily Rosario Eland I, MD   1 tablet at 10/28/24 0825   nicotine  (NICODERM CQ  - dosed in mg/24 hours) patch 14 mg  14 mg Transdermal Daily Fredia Dorothe HERO, MD   14 mg at 10/28/24 9175   ondansetron  (ZOFRAN ) tablet 4 mg  4 mg Oral Q4H PRN Janjua, Rashid M, MD       Or   ondansetron  (ZOFRAN ) injection 4 mg  4 mg Intravenous Q4H PRN Janjua, Rashid M, MD   4 mg at 08/01/24 1506   Oral care mouth rinse  15 mL Mouth Rinse PRN Mannam, Praveen, MD       pantoprazole  (PROTONIX ) EC tablet 40 mg  40 mg Oral QHS Hongalgi, Anand D, MD   40 mg at 10/27/24 2243   phenol (CHLORASEPTIC) mouth spray 1 spray  1 spray Mouth/Throat PRN Franky Redia SAILOR, MD   1 spray at 09/03/24 0057   polyethylene glycol (MIRALAX  / GLYCOLAX ) packet 17 g  17 g Oral Daily Desai, Rahul P, PA-C   17 g at 10/21/24 1041   promethazine  (PHENERGAN ) tablet 12.5-25 mg  12.5-25 mg Oral Q4H PRN Janjua, Rashid M, MD       propranolol  (INDERAL ) tablet 20 mg  20 mg Oral BID Regalado, Belkys A, MD   20 mg at 10/28/24 9177   senna (SENOKOT) tablet 17.2 mg  2 tablet Oral Daily Desai, Rahul P, PA-C   17.2 mg at 10/27/24 1004   sodium chloride  flush (NS) 0.9 % injection 10-40 mL  10-40 mL Intracatheter PRN Franky Redia SAILOR, MD   30 mL  at 07/29/24 1135   thiamine  (VITAMIN B1) tablet 100 mg  100 mg Oral Daily Hongalgi, Anand D, MD   100 mg at 10/28/24 9177   valbenazine  (INGREZZA ) capsule 40 mg  40 mg Oral Daily Starkes-Perry, Takia S, FNP   40 mg at  10/28/24 9176     Discharge Medications: Please see discharge summary for a list of discharge medications.  Relevant Imaging Results:  Relevant Lab Results:   Additional Information SSN [id number]  [name], LCSWA     "

## 2024-10-28 NOTE — Plan of Care (Signed)
  Problem: Education: Goal: Knowledge of General Education information will improve Description: Including pain rating scale, medication(s)/side effects and non-pharmacologic comfort measures Outcome: Progressing   Problem: Health Behavior/Discharge Planning: Goal: Ability to manage health-related needs will improve Outcome: Progressing   Problem: Clinical Measurements: Goal: Ability to maintain clinical measurements within normal limits will improve Outcome: Progressing Goal: Will remain free from infection Outcome: Progressing Goal: Diagnostic test results will improve Outcome: Progressing Goal: Respiratory complications will improve Outcome: Progressing Goal: Cardiovascular complication will be avoided Outcome: Progressing   Problem: Activity: Goal: Risk for activity intolerance will decrease Outcome: Progressing   Problem: Nutrition: Goal: Adequate nutrition will be maintained Outcome: Progressing   Problem: Coping: Goal: Level of anxiety will decrease Outcome: Progressing   Problem: Elimination: Goal: Will not experience complications related to bowel motility Outcome: Progressing Goal: Will not experience complications related to urinary retention Outcome: Progressing   Problem: Pain Managment: Goal: General experience of comfort will improve and/or be controlled Outcome: Progressing   Problem: Safety: Goal: Ability to remain free from injury will improve Outcome: Progressing   Problem: Skin Integrity: Goal: Risk for impaired skin integrity will decrease Outcome: Progressing   Problem: Education: Goal: Knowledge of the prescribed therapeutic regimen will improve Outcome: Progressing   Problem: Clinical Measurements: Goal: Usual level of consciousness will be regained or maintained. Outcome: Progressing Goal: Neurologic status will improve Outcome: Progressing Goal: Ability to maintain intracranial pressure will improve Outcome: Progressing    Problem: Skin Integrity: Goal: Demonstration of wound healing without infection will improve Outcome: Progressing

## 2024-10-28 NOTE — Progress Notes (Addendum)
 " Progress Note   Patient: Ernest Romero FMW:[id number] DOB: 08-30-1966 DOA: 07/15/2024     105 DOS: the patient was seen and examined on 10/28/2024   Brief hospital course: Ernest Romero is a 59 y.o. male with a history of alcohol abuse, bipolar disorder, panic disorder.  Patient presented secondary to being found down and found to have evidence of a large subdural hematoma, undergoing a left frontoparietal craniotomy with evacuation of the subdural hematoma and placement of a drain and subsequent removal. Patient required further management of his craniotomy and flap, which necessitated [facility], where neurosurgery performed a left-sided allograft cranioplasty. Repeat CT head was significant for worsening fluid, requiring a left frontal burr hole for evacuation on 10/30. 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 Patient underwent a left bone flap removal and was treated with IV antibiotics per infectious disease recommendations on 10/08/2024 for total of 8 weeks of antibiotics.  Patient is currently a ward of the state with DSS and does not have any bed offers and is difficult to place.  10/27 crani w/ flap replacement 10/29: off clevi, liberate SBP goal, Klonopin , lithium , Haldol  as needed discontinued.  Latuda  dose cut by half 10/30: repeat CT head with increased collection - going to OR this afternoon for evacuation 10/31: OR  for SDH evacuation.  Developed arm twitching.  Klonopin  restarted at half dose 11/1: More awake.  No complaint 11/2 transferred out of ICU. 11/4 cortrak removed 11/8 CT head showed fluid collection under bone flap, decision made to remove bone flap 11/10 to OR for L bone flap removal 11/11 repeat CT head stable 11/29: notice to have worsening Dysphagia.  12/1 repeat CT head: stable.  12/3: Cortrack placed for nutrition, sutures removed by  neurosurgery.  Tube feeds started. 12/4: Very sedated, psychiatry requested to readjust meds, reduced clonazepam  to0.5mg  BID, decreased valbenazine  to 40 mg daily 12/5: Noted oriented but jaw tremors worse.  Evaluated by neurology, recommended psych to continue to follow and adjust medications  Assessment and Plan:  #) Recurrent subdural hematoma: See above for procedures, underwent craniectomy and bur hole procedure.  Appears to be neurologically stable  #) MRSA infected bone flap: Completed total of 8 weeks of antibiotics, IV vancomycin  completed on 09/23/2024 and additional 2 weeks of doxycycline  completed on 10/18/2024.  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  Will need significant community support and resources.  #) Bipolar disorder: - Continue valbenazine  40 mg daily, benztropine  1 mg twice daily  #) Drug-induced parkinsonism/akathisia: Secondary to antipsychotic use.  No evidence of seizure. - Continue propranolol  20 mg twice daily, clonazepam  0.5 mg 3 times daily, valbenazine   #) Dysphagia: Required NG tube feeding from 12/3 to 09/09/2024 - Continue dysphagia 3 diet  #) Hypertension: - Continue propranolol  20 mg twice daily, amlodipine , losartan   #) Iron deficiency anemia: - Continue oral iron supplementation  #) Acute hypoxic respiratory failure secondary to aspiration pneumonia: Completed a course of Unasyn   Fluids: None Electrolytes: Check intermittently Nutrition: Dysphagia 3 diet  Prophylaxis: Enoxaparin  40 mg  Disposition: Complex, ward of the state with DSS, Patient's support counselor Ernest Romero has been visiting intermittently. [phone])  Full code        Subjective: Eating breakfast, no complaints  Physical Exam: Vitals:   10/27/24 2004 10/28/24 0500 10/28/24 0519 10/28/24 0804  BP: 117/88  104/79 113/83  Pulse: 67  (!) 50 (!) 59  Resp:   20   Temp: 97.9 F (36.6 C)   97.9 F (36.6 C)   TempSrc:      SpO2: 96%  95% 96%  Weight:  80.6 kg    Height:       General Appearance:  in no acute distress Skin:  there are no suspicious lesions or rashes of concern Head/face: Incisions, deformity Eyes:  No gross abnormalities. Ears:  exam deferred Nose/Sinuses:  negative Mouth/Throat:  Mucosa moist, no lesions; pharynx without erythema, edema or exudate. Neck:  neck- supple, no mass, non-tender Lungs:  Normal expansion.  Clear to auscultation.  No rales, rhonchi, or wheezing. Heart:  Heart sounds are normal.  Regular rate and rhythm without murmur, gallop or rub. Abdomen:  Soft, non-tender, normal bowel sounds; no bruits, organomegaly or masses. Extremities: no edema Musculoskeletal:  negative Peripheral Pulses: Deferred Neurologic:  negative Psych exam: Deferred  Data Reviewed:  There are no new results to review at this time.  Family Communication: Patient's support counselor Ernest Romero has been visiting intermittently. [phone])  Disposition: Status is: Inpatient Remains inpatient appropriate because: Difficult placement with complex social situation, ward of the state with DSS  Planned Discharge Destination: Barriers to discharge: Ward of the state with DSS     Author: Maybelle JAYSON Pelton, MD 10/28/2024 9:47 AM  For on call review [email or web address].  "

## 2024-10-28 NOTE — TOC Progression Note (Signed)
 Transition of Care [facility]) - Progression Note    Patient Details  Name: Ernest Romero MRN: [id number] Date of Birth: 1966/03/06  Transition of Care [facility]) CM/SW Contact  [name], [place] Phone Number: 10/28/2024, 1:59 PM  Clinical Narrative:     Update: CSW sent FL2 with TBI diagnosis to Zelvia via email.  CSW sent most recent MD progress note to Zelvia Franks, [facility] case worker, via email to aid in placement process. Zelvia confirmed she had received the note and would add it to patient's records. Zelvia shared there is a plan to conduct a 1915I assessment on patient soon. CSW will continue to communicate with Zelvia and update dc plan.   Expected Discharge Plan: Skilled Nursing Facility Barriers to Discharge: Continued Medical Work up, English As A Second Language Teacher, SNF Pending bed offer               Expected Discharge Plan and Services In-house Referral: Clinical Social Work Discharge Planning Services: CM Consult                                           Social Drivers of Health (SDOH) Interventions SDOH Screenings   Food Insecurity: Patient Unable To Answer (07/16/2024)  Housing: Unknown (07/16/2024)  Transportation Needs: Patient Unable To Answer (07/16/2024)  Utilities: Patient Unable To Answer (07/16/2024)  Alcohol Screen: Low Risk (08/30/2022)  Depression (PHQ2-9): Medium Risk (08/30/2022)  Tobacco Use: Medium Risk (08/12/2024)    Readmission Risk Interventions    07/16/2024   12:23 PM  Readmission Risk Prevention Plan  Transportation Screening Complete  Medication Review (RN Care Manager) Complete  PCP or Specialist appointment within 3-5 days of discharge Complete  HRI or Home Care Consult Complete  SW Recovery Care/Counseling Consult Complete  Palliative Care Screening Not Applicable  Skilled Nursing Facility Not Applicable

## 2024-10-29 LAB — COMPREHENSIVE METABOLIC PANEL WITH GFR
ALT: 55 U/L — ABNORMAL HIGH (ref 0–44)
AST: 36 U/L (ref 15–41)
Albumin: 3.9 g/dL (ref 3.5–5.0)
Alkaline Phosphatase: 65 U/L (ref 38–126)
Anion gap: 10 (ref 5–15)
BUN: 22 mg/dL — ABNORMAL HIGH (ref 6–20)
CO2: 27 mmol/L (ref 22–32)
Calcium: 9.2 mg/dL (ref 8.9–10.3)
Chloride: 107 mmol/L (ref 98–111)
Creatinine, Ser: 1.06 mg/dL (ref 0.61–1.24)
GFR, Estimated: >60 mL/min
Glucose, Bld: 95 mg/dL (ref 70–99)
Potassium: 4 mmol/L (ref 3.5–5.1)
Sodium: 143 mmol/L (ref 135–145)
Total Bilirubin: 0.2 mg/dL (ref 0.0–1.2)
Total Protein: 6.2 g/dL — ABNORMAL LOW (ref 6.5–8.1)

## 2024-10-29 LAB — CBC WITH DIFFERENTIAL/PLATELET
Abs Immature Granulocytes: 0.05 10*3/uL (ref 0.00–0.07)
Basophils Absolute: 0.1 10*3/uL (ref 0.0–0.1)
Basophils Relative: 1 %
Eosinophils Absolute: 0.3 10*3/uL (ref 0.0–0.5)
Eosinophils Relative: 5 %
HCT: 42.3 % (ref 39.0–52.0)
Hemoglobin: 14.1 g/dL (ref 13.0–17.0)
Immature Granulocytes: 1 %
Lymphocytes Relative: 35 %
Lymphs Abs: 1.8 10*3/uL (ref 0.7–4.0)
MCH: 30.1 pg (ref 26.0–34.0)
MCHC: 33.3 g/dL (ref 30.0–36.0)
MCV: 90.2 fL (ref 80.0–100.0)
Monocytes Absolute: 0.6 10*3/uL (ref 0.1–1.0)
Monocytes Relative: 13 %
Neutro Abs: 2.2 10*3/uL (ref 1.7–7.7)
Neutrophils Relative %: 45 %
Platelets: 167 10*3/uL (ref 150–400)
RBC: 4.69 MIL/uL (ref 4.22–5.81)
RDW: 14.1 % (ref 11.5–15.5)
WBC: 5 10*3/uL (ref 4.0–10.5)
nRBC: 0 % (ref 0.0–0.2)

## 2024-10-29 LAB — PHOSPHORUS: Phosphorus: 4.4 mg/dL (ref 2.5–4.6)

## 2024-10-29 LAB — MAGNESIUM: Magnesium: 2 mg/dL (ref 1.7–2.4)

## 2024-10-29 NOTE — Plan of Care (Signed)
  Problem: Education: Goal: Knowledge of General Education information will improve Description: Including pain rating scale, medication(s)/side effects and non-pharmacologic comfort measures Outcome: Progressing   Problem: Health Behavior/Discharge Planning: Goal: Ability to manage health-related needs will improve Outcome: Progressing   Problem: Clinical Measurements: Goal: Ability to maintain clinical measurements within normal limits will improve Outcome: Progressing Goal: Will remain free from infection Outcome: Progressing Goal: Diagnostic test results will improve Outcome: Progressing Goal: Respiratory complications will improve Outcome: Progressing Goal: Cardiovascular complication will be avoided Outcome: Progressing   Problem: Activity: Goal: Risk for activity intolerance will decrease Outcome: Progressing   Problem: Nutrition: Goal: Adequate nutrition will be maintained Outcome: Progressing   Problem: Coping: Goal: Level of anxiety will decrease Outcome: Progressing   Problem: Elimination: Goal: Will not experience complications related to bowel motility Outcome: Progressing Goal: Will not experience complications related to urinary retention Outcome: Progressing   Problem: Pain Managment: Goal: General experience of comfort will improve and/or be controlled Outcome: Progressing   Problem: Safety: Goal: Ability to remain free from injury will improve Outcome: Progressing   Problem: Skin Integrity: Goal: Risk for impaired skin integrity will decrease Outcome: Progressing   Problem: Education: Goal: Knowledge of the prescribed therapeutic regimen will improve Outcome: Progressing   Problem: Clinical Measurements: Goal: Usual level of consciousness will be regained or maintained. Outcome: Progressing Goal: Neurologic status will improve Outcome: Progressing Goal: Ability to maintain intracranial pressure will improve Outcome: Progressing    Problem: Skin Integrity: Goal: Demonstration of wound healing without infection will improve Outcome: Progressing

## 2024-10-29 NOTE — Progress Notes (Signed)
 " Progress Note   Patient: Ernest Romero FMW:[id number] DOB: [birth year] DOA: 07/15/2024     106 DOS: the patient was seen and examined on 10/29/2024   Brief hospital course: Ernest Romero is a 59 y.o. male with a history of alcohol abuse, bipolar disorder, panic disorder.  Patient presented secondary to being found down and found to have evidence of a large subdural hematoma, undergoing a left frontoparietal craniotomy with evacuation of the subdural hematoma and placement of a drain and subsequent removal. Patient required further management of his craniotomy and flap, which necessitated transfer [facility] to [facility], where neurosurgery performed a left-sided allograft cranioplasty. Repeat CT head was significant for worsening fluid, requiring a left frontal burr hole for evacuation on 10/30. 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 Patient underwent a left bone flap removal and was treated with IV antibiotics per infectious disease recommendations on 10/08/2024 for total of 8 weeks of antibiotics.  Patient is currently a ward of the state with DSS and does not have any bed offers and is difficult to place.  10/27 crani w/ flap replacement 10/29: off clevi, liberate SBP goal, Klonopin , lithium , Haldol  as needed discontinued.  Latuda  dose cut by half 10/30: repeat CT head with increased collection - going to OR this afternoon for evacuation 10/31: OR  for SDH evacuation.  Developed arm twitching.  Klonopin  restarted at half dose 11/1: More awake.  No complaint 11/2 transferred out of ICU. 11/4 cortrak removed 11/8 CT head showed fluid collection under bone flap, decision made to remove bone flap 11/10 to OR for L bone flap removal 11/11 repeat CT head stable 11/29: notice to have worsening Dysphagia.  12/1 repeat CT head: stable.  12/3: Cortrack placed for nutrition, sutures removed by  neurosurgery.  Tube feeds started. 12/4: Very sedated, psychiatry requested to readjust meds, reduced clonazepam  to0.5mg  BID, decreased valbenazine  to 40 mg daily 12/5: Noted oriented but jaw tremors worse.  Evaluated by neurology, recommended psych to continue to follow and adjust medications  Assessment and Plan:  #) Recurrent subdural hematoma: See above for procedures, underwent craniectomy and bur hole procedure.  Appears to be neurologically stable  #) MRSA infected bone flap: Completed total of 8 weeks of antibiotics, IV vancomycin  completed on 09/23/2024 and additional 2 weeks of doxycycline  completed on 10/18/2024.  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  Will need significant community support and resources.  #) Bipolar disorder: - Continue valbenazine  40 mg daily, benztropine  1 mg twice daily  #) Drug-induced parkinsonism/akathisia: Secondary to antipsychotic use.  No evidence of seizure. - Continue propranolol  20 mg twice daily, clonazepam  0.5 mg 3 times daily, valbenazine   #) Dysphagia: Required NG tube feeding from 12/3 to 09/09/2024 - Continue dysphagia 3 diet  #) Hypertension: - Continue propranolol  20 mg twice daily, amlodipine , losartan   #) Iron deficiency anemia: - Continue oral iron supplementation  #) Acute hypoxic respiratory failure secondary to aspiration pneumonia: Completed a course of Unasyn   Fluids: None Electrolytes: Check intermittently Nutrition: Dysphagia 3 diet  Prophylaxis: Enoxaparin  40 mg  Disposition: Complex, ward of the state with DSS, Patient's support counselor Ernest Romero has been visiting intermittently. [phone])  Full code        Subjective: Eating breakfast, no complaints  Physical Exam: Vitals:   10/28/24 2031 10/29/24 0436 10/29/24 0500 10/29/24 0902  BP: 92/62 113/80  108/77  Pulse: (!) 58   61  Resp: 18 18  18   Temp: 97.6 F (36.4 C) 97.6 F (36.4 C)  98 F  (36.7 C)  TempSrc:    Oral  SpO2: 97% 97%  96%  Weight:   80 kg   Height:       General Appearance:  in no acute distress Skin:  there are no suspicious lesions or rashes of concern Head/face: Incisions, deformity Eyes:  No gross abnormalities. Ears:  exam deferred Nose/Sinuses:  negative Mouth/Throat:  Mucosa moist, no lesions; pharynx without erythema, edema or exudate. Neck:  neck- supple, no mass, non-tender Lungs:  Normal expansion.  Clear to auscultation.  No rales, rhonchi, or wheezing. Heart:  Heart sounds are normal.  Regular rate and rhythm without murmur, gallop or rub. Abdomen:  Soft, non-tender, normal bowel sounds; no bruits, organomegaly or masses. Extremities: no edema Musculoskeletal:  negative Peripheral Pulses: Deferred Neurologic:  negative Psych exam: Deferred  Data Reviewed:  There are no new results to review at this time.  Family Communication: Patient's support counselor Ernest Romero has been visiting intermittently. [phone])  Disposition: Status is: Inpatient Remains inpatient appropriate because: Difficult placement with complex social situation, ward of the state with DSS  Planned Discharge Destination: Barriers to discharge: Ward of the state with DSS     Author: Maybelle JAYSON Pelton, MD 10/29/2024 9:56 AM  For on call review [email or web address].  "

## 2024-10-29 NOTE — Progress Notes (Signed)
 Physical Therapy Treatment and Progress Note Patient Details Name: Ernest Romero MRN: [id number] DOB: September 27, 1966 Today's Date: 10/29/2024   History of Present Illness 59 y.o. male presenting 07/16/24 from [facility] where he was admitted [facility]/30/[facility]. 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 On CIWA protocol. Transferred to [facility] for further management of craniotomy and flap. 10/27 s/p cranioplasty with bone flap replacement. 10/28 Change in status thought to be related to Klonopin  administration 10/28, repeat CTH shows significant increase in size of mixed attenuation subdural fluid collection underlying the L tempoparietal craniotomy site, now measuring approximately 15 mm in thickness, with associated mild mass effect and slight rightward midline shift. OR on 10/30 for SDH evacuation. [facility] 11/7 with fluid collected under bone flap. 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 Worsening dysphagia 11/29; repeat CTH stable 12/1. Cortrak placed 12/3-12/8. PMH includes alcohol use disorder, psychiatric disorder, HTN.    PT Comments  Pt has met all of his goals. Progressing well. Updated goals this session for independence.  Pt 49/54 on BERG balance demonstrating low risk for falls. Currently pt is CGA for stairs with 1x reminder to use rail and able to navigate obstacles in the hall without LOB. Pt with improvement in arm swing for improved stability during gait. Due to pt current functional status, home set up and available assistance at home recommending skilled physical therapy services 3x/week in order to address strength, balance and functional mobility to decrease risk for falls, injury and re-hospitalization.      If plan is discharge home, recommend the following: Assistance with cooking/housework;Direct supervision/assist for medications management;Direct supervision/assist for  financial management;Assist for transportation;Help with stairs or ramp for entrance;Supervision due to cognitive status;A little help with walking and/or transfers;A little help with bathing/dressing/bathroom   Can travel by private vehicle     Yes  Equipment Recommendations  None recommended by PT    Recommendations for Other Services       Precautions / Restrictions Precautions Precautions: Fall Recall of Precautions/Restrictions: Impaired Precaution/Restrictions Comments: helmet (pt to wear when OOB) Restrictions Weight Bearing Restrictions Per Provider Order: No     Mobility  Bed Mobility Overal bed mobility: Modified Independent Bed Mobility: Supine to Sit           General bed mobility comments: able to get to EOB without assitance, left on EOB at end of session    Transfers Overall transfer level: Modified independent Equipment used: None Transfers: Sit to/from Stand Sit to Stand: Modified independent (Device/Increase time)           General transfer comment: wide BOS    Ambulation/Gait Ambulation/Gait assistance: Supervision Gait Distance (Feet): 800 Feet Assistive device: None Gait Pattern/deviations: Step-through pattern, Decreased stride length, Narrow base of support   Gait velocity interpretation: >2.62 ft/sec, indicative of community ambulatory   General Gait Details: Stiff gait pattern with minimal arm swing though significantly improving from last progress note. Slight L knee flexion when during loading response. Pt was able to don shoes initially with narrow BOS requiring verbal cues to prevent scuffing shoes together with significant improvement by end of session.   Stairs   Stairs assistance: Contact guard assist Stair Management: One rail Right, Alternating pattern, Step to pattern, Forwards Number of Stairs: 14 General stair comments: CGA for safety, 1x reminder to use rail when ascending steps.     Balance Overall  balance  assessment: Mild deficits observed, not formally tested Sitting-balance support: No upper extremity supported, Feet supported Sitting balance-Leahy Scale: Good   Postural control: Posterior lean Standing balance support: No upper extremity supported, During functional activity Standing balance-Leahy Scale: Fair Standing balance comment: CGA               High Level Balance Comments: stepping around obstacles, navigating stairs   [employer] Sit to Stand: Able to stand without using hands and stabilize independently Standing Unsupported: Able to stand safely 2 minutes Sitting with Back Unsupported but Feet Supported on Floor or Stool: Able to sit safely and securely 2 minutes Stand to Sit: Sits safely with minimal use of hands Transfers: Able to transfer safely, minor use of hands Standing Unsupported with Eyes Closed: Able to stand 10 seconds safely Standing Ubsupported with Feet Together: Able to place feet together independently and stand 1 minute safely From Standing, Reach Forward with Outstretched Arm: Can reach forward >12 cm safely (5) From Standing Position, Pick up Object from Floor: Able to pick up shoe safely and easily From Standing Position, Turn to Look Behind Over each Shoulder: Looks behind one side only/other side shows less weight shift Turn 360 Degrees: Able to turn 360 degrees safely one side only in 4 seconds or less Standing Unsupported, Alternately Place Feet on Step/Stool: Able to complete 4 steps without aid or supervision Standing Unsupported, One Foot in Front: Able to plae foot ahead of the other independently and hold 30 seconds Standing on One Leg: Able to lift leg independently and hold 5-10 seconds Total Score: 49        Communication Communication Communication: Impaired Factors Affecting Communication: Difficulty expressing self  Cognition Arousal: Alert Behavior During Therapy: WFL for tasks assessed/performed   PT - Cognitive  impairments: Memory, Safety/Judgement, Attention       Rancho Levels of Cognitive Functioning Rancho Los Amigos Scales of Cognitive Functioning: Automatic, Appropriate: Minimal Assistance for Daily Living Skills Rancho Los Amigos Scales of Cognitive Functioning: Automatic, Appropriate: Minimal Assistance for Daily Living Skills [VII]   Following commands: Impaired Following commands impaired: Only follows one step commands consistently, Follows multi-step commands with increased time    Cueing Cueing Techniques: Verbal cues, Visual cues         Pertinent Vitals/Pain Pain Assessment Pain Assessment: No/denies pain     PT Goals (current goals can now be found in the care plan section) Acute Rehab PT Goals Patient Stated Goal: rehab PT Goal Formulation: With patient Time For Goal Achievement: 11/26/24 Potential to Achieve Goals: Fair Progress towards PT goals: Progressing toward goals    Frequency    Min 2X/week      PT Plan  Updated goals       AM-PAC PT 6 Clicks Mobility   Outcome Measure  Help needed turning from your back to your side while in a flat bed without using bedrails?: None Help needed moving from lying on your back to sitting on the side of a flat bed without using bedrails?: None Help needed moving to and from a bed to a chair (including a wheelchair)?: None Help needed standing up from a chair using your arms (e.g., wheelchair or bedside chair)?: None Help needed to walk in hospital room?: None Help needed climbing 3-5 steps with a railing? : A Little 6 Click Score: 23    End of Session Equipment Utilized During Treatment: Gait belt;Other (comment) (helmet) Activity Tolerance: Patient tolerated treatment well Patient left: in  bed;with call bell/phone within reach Nurse Communication: Mobility status PT Visit Diagnosis: Other abnormalities of gait and mobility (R26.89);Other symptoms and signs involving the nervous system (R29.898)     Time:  8468-8445 PT Time Calculation (min) (ACUTE ONLY): 23 min  Charges:    $Therapeutic Activity: 23-37 mins PT General Charges $$ ACUTE PT VISIT: 1 Visit            Dorothyann Maier, DPT, CLT  Acute Rehabilitation Services Office: [phone] (Secure chat preferred)    Dorothyann VEAR Maier 10/29/2024, 4:01 PM

## 2024-10-30 NOTE — Progress Notes (Signed)
 Occupational Therapy Treatment Patient Details Name: Ernest Romero MRN: [id number] DOB: 05/05/1966 Today's Date: 10/30/2024   History of present illness 59 y.o. male presenting 07/16/24 from [facility] where he was admitted 06/30/24 for unresponsive episode in the jail. 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 On CIWA protocol. Transferred to [facility] for further management of craniotomy and flap. 10/27 s/p cranioplasty with bone flap replacement. 10/28 Change in status thought to be related to Klonopin  administration 10/28, repeat CTH shows significant increase in size of mixed attenuation subdural fluid collection underlying the L tempoparietal craniotomy site, now measuring approximately 15 mm in thickness, with associated mild mass effect and slight rightward midline shift. OR on 10/30 for SDH evacuation. [facility] 11/7 with fluid collected under bone flap. 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 Worsening dysphagia 11/29; repeat CTH stable 12/1. Cortrak placed 12/3-12/8. PMH includes alcohol use disorder, psychiatric disorder, HTN.   OT comments  OT session focused on training in techniques for increased safety and independence with ADLs and review of therapeutic exercises for jaw. Pt participated well in session an dis making progress toward goals. Pt currently demonstrating ability to refer to and follow posted checklist of items to have in place prior to mobility with Mod I and no cues needed. Pt also demonstrating ability to complete ADLs largely with Supervision, bed mobility with Mod I, and functional mobility without an AD with Supervision. Pt demonstrating ability to complete HEP of jaw exercises with Supervision and occasional cues for technique. Acute OT to continue to follow. Continue to recommend post acute community-based placement with 24/7 supervision/assistance paired with HH OT.       If plan is  discharge home, recommend the following:  Supervision due to cognitive status;Direct supervision/assist for medications management;Assistance with cooking/housework;Direct supervision/assist for financial management;Assist for transportation;Assistance with feeding;A little help with walking and/or transfers;Help with stairs or ramp for entrance;A little help with bathing/dressing/bathroom   Equipment Recommendations  None recommended by OT    Recommendations for Other Services      Precautions / Restrictions Precautions Precautions: Fall Recall of Precautions/Restrictions: Impaired Precaution/Restrictions Comments: helmet (pt to wear when OOB) Restrictions Weight Bearing Restrictions Per Provider Order: No       Mobility Bed Mobility Overal bed mobility: Modified Independent                  Transfers Overall transfer level: Needs assistance (Mod I to Supervision) Equipment used: None Transfers: Sit to/from Stand, Bed to chair/wheelchair/BSC Sit to Stand: Modified independent (Device/Increase time)     Step pivot transfers: Supervision           Balance Overall balance assessment: Mild deficits observed, not formally tested Sitting-balance support: No upper extremity supported, Feet supported Sitting balance-Leahy Scale: Good     Standing balance support: No upper extremity supported, During functional activity Standing balance-Leahy Scale: Fair                             ADL either performed or assessed with clinical judgement   ADL Overall ADL's : Needs assistance/impaired Eating/Feeding: Modified independent;Set up;Sitting   Grooming: Wash/dry hands;Wash/dry face;Supervision/safety;Standing                   Toilet Transfer: Supervision/safety;Ambulation;Regular Toilet   Toileting- Architect and Hygiene: Supervision/safety;Sit to/from stand  Functional mobility during ADLs: Supervision/safety (without an AD in  room)      Extremity/Trunk Assessment Upper Extremity Assessment Upper Extremity Assessment: Right hand dominant;RUE deficits/detail;LUE deficits/detail RUE Deficits / Details: mildly decreased fine motor coordination; otherwise overall WFL for tasks assessed RUE Coordination: decreased fine motor (mild) LUE Deficits / Details: mildly decreased coordination; otherwise overall WFL for tasks assessed LUE Coordination: decreased fine motor (mild)   Lower Extremity Assessment Lower Extremity Assessment: Defer to PT evaluation        Vision       Perception     Praxis     Communication Communication Communication: Impaired Factors Affecting Communication: Difficulty expressing self   Cognition Arousal: Alert Behavior During Therapy: WFL for tasks assessed/performed Cognition: Cognition impaired     Awareness: Intellectual awareness intact, Online awareness intact (intermittent online awareness with improvements noted this session as compared to last OT session) Memory impairment (select all impairments): Working civil service fast streamer, Short-term memory Attention impairment (select first level of impairment): Divided attention Executive functioning impairment (select all impairments): Organization, Problem solving, Reasoning OT - Cognition Comments: Pt conitnues to demo cognitive deficts, but is making good progress with functional goals and demonstrates noted improvements in safety awareness and ability to sequence tasks. Pt now referring to poster with items needed prior to ambulation with Mod I and no cues                 Following commands: Impaired Following commands impaired: Only follows one step commands consistently, Follows multi-step commands with increased time      Cueing   Cueing Techniques: Verbal cues, Visual cues  Exercises Exercises: Other exercises Other Exercises Other Exercises: performed jaw exercises following HEP wiht pt requiring Sueprvision and occasional  cues for technique    Shoulder Instructions       General Comments VSS on RA. RN present for a portion of session to administer medication    Pertinent Vitals/ Pain       Pain Assessment Pain Assessment: No/denies pain Pain Intervention(s): Monitored during session  Home Living                                          Prior Functioning/Environment              Frequency  Min 2X/week        Progress Toward Goals  OT Goals(current goals can now be found in the care plan section)  Progress towards OT goals: Progressing toward goals  Acute Rehab OT Goals Patient Stated Goal: to be able to leave the hospital  Plan      Co-evaluation                 AM-PAC OT 6 Clicks Daily Activity     Outcome Measure   Help from another person eating meals?: None Help from another person taking care of personal grooming?: A Little Help from another person toileting, which includes using toliet, bedpan, or urinal?: A Little Help from another person bathing (including washing, rinsing, drying)?: A Little Help from another person to put on and taking off regular upper body clothing?: A Little Help from another person to put on and taking off regular lower body clothing?: A Little 6 Click Score: 19    End of Session Equipment Utilized During Treatment: Other (comment);Gait belt (helmet)  OT Visit Diagnosis: Other abnormalities of gait and  mobility (R26.89);Other symptoms and signs involving the nervous system (R29.898);Cognitive communication deficit (R41.841);Other symptoms and signs involving cognitive function Symptoms and signs involving cognitive functions:  (SDH s/p L frontoparietal craniotomy with evacuation of SDH  by Mikki Margarie BRAVO, OT at 09/19/24 1729)   Activity Tolerance Patient tolerated treatment well   Patient Left in bed;with call bell/phone within reach (sitting EOB eating a snack)   Nurse Communication Mobility status;Other (comment)  (pt sitting EOB eating a snack)        Time: 8369-8351 OT Time Calculation (min): 18 min  Charges: OT General Charges $OT Visit: 1 Visit OT Treatments $Self Care/Home Management : 8-22 mins  Margarie Rockey HERO., OTR/L, MA Acute Rehab [phone]   Margarie BRAVO Mikki 10/30/2024, 6:10 PM

## 2024-10-30 NOTE — Plan of Care (Signed)
  Problem: Education: Goal: Knowledge of General Education information will improve Description: Including pain rating scale, medication(s)/side effects and non-pharmacologic comfort measures Outcome: Progressing   Problem: Health Behavior/Discharge Planning: Goal: Ability to manage health-related needs will improve Outcome: Progressing   Problem: Clinical Measurements: Goal: Ability to maintain clinical measurements within normal limits will improve Outcome: Progressing Goal: Will remain free from infection Outcome: Progressing Goal: Diagnostic test results will improve Outcome: Progressing Goal: Respiratory complications will improve Outcome: Progressing Goal: Cardiovascular complication will be avoided Outcome: Progressing   Problem: Activity: Goal: Risk for activity intolerance will decrease Outcome: Progressing   Problem: Nutrition: Goal: Adequate nutrition will be maintained Outcome: Progressing   Problem: Coping: Goal: Level of anxiety will decrease Outcome: Progressing   Problem: Elimination: Goal: Will not experience complications related to bowel motility Outcome: Progressing Goal: Will not experience complications related to urinary retention Outcome: Progressing   Problem: Pain Managment: Goal: General experience of comfort will improve and/or be controlled Outcome: Progressing   Problem: Safety: Goal: Ability to remain free from injury will improve Outcome: Progressing   Problem: Skin Integrity: Goal: Risk for impaired skin integrity will decrease Outcome: Progressing   Problem: Education: Goal: Knowledge of the prescribed therapeutic regimen will improve Outcome: Progressing   Problem: Clinical Measurements: Goal: Usual level of consciousness will be regained or maintained. Outcome: Progressing Goal: Neurologic status will improve Outcome: Progressing Goal: Ability to maintain intracranial pressure will improve Outcome: Progressing    Problem: Skin Integrity: Goal: Demonstration of wound healing without infection will improve Outcome: Progressing

## 2024-10-30 NOTE — Progress Notes (Signed)
 " Progress Note   Patient: Ernest Romero FMW:[id number] DOB: 01/21/1966 DOA: 07/15/2024     107 DOS: the patient was seen and examined on 10/30/2024   Brief hospital course: JARICK HARKINS is a 58 y.o. male with a history of alcohol abuse, bipolar disorder, panic disorder.  Patient presented secondary to being found down and found to have evidence of a large subdural hematoma, undergoing a left frontoparietal craniotomy with evacuation of the subdural hematoma and placement of a drain and subsequent removal. Patient required further management of his craniotomy and flap, which necessitated transfer from [facility] to [facility], where neurosurgery performed a left-sided allograft cranioplasty. Repeat CT head was significant for worsening fluid, requiring a left frontal burr hole for evacuation on 10/30. 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 Patient underwent a left bone flap removal and was treated with IV antibiotics per infectious disease recommendations on 10/08/2024 for total of 8 weeks of antibiotics.  Patient is currently a ward of the state with DSS and does not have any bed offers and is difficult to place.  10/27 crani w/ flap replacement 10/29: off clevi, liberate SBP goal, Klonopin , lithium , Haldol  as needed discontinued.  Latuda  dose cut by half 10/30: repeat CT head with increased collection - going to OR this afternoon for evacuation 10/31: OR  for SDH evacuation.  Developed arm twitching.  Klonopin  restarted at half dose 11/1: More awake.  No complaint 11/2 transferred out of ICU. 11/4 cortrak removed 11/8 CT head showed fluid collection under bone flap, decision made to remove bone flap 11/10 to OR for L bone flap removal 11/11 repeat CT head stable 11/29: notice to have worsening Dysphagia.  12/1 repeat CT head: stable.  12/3: Cortrack placed for nutrition, sutures removed by  neurosurgery.  Tube feeds started. 12/4: Very sedated, psychiatry requested to readjust meds, reduced clonazepam  to0.5mg  BID, decreased valbenazine  to 40 mg daily 12/5: Noted oriented but jaw tremors worse.  Evaluated by neurology, recommended psych to continue to follow and adjust medications  Assessment and Plan:  #) Recurrent subdural hematoma: See above for procedures, underwent craniectomy and bur hole procedure.  Appears to be neurologically stable  #) MRSA infected bone flap: Completed total of 8 weeks of antibiotics, IV vancomycin  completed on 09/23/2024 and additional 2 weeks of doxycycline  completed on 10/18/2024.  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  Will need significant community support and resources.  #) Bipolar disorder: - Continue valbenazine  40 mg daily, benztropine  1 mg twice daily  #) Drug-induced parkinsonism/akathisia: Secondary to antipsychotic use.  No evidence of seizure. - Continue propranolol  20 mg twice daily, clonazepam  0.5 mg 3 times daily, valbenazine   #) Dysphagia: Required NG tube feeding from 12/3 to 09/09/2024 - Continue dysphagia 3 diet  #) Hypertension: - Continue propranolol  20 mg twice daily, amlodipine , losartan   #) Iron deficiency anemia: - Continue oral iron supplementation  #) Acute hypoxic respiratory failure secondary to aspiration pneumonia: Completed a course of Unasyn   Fluids: None Electrolytes: Check intermittently Nutrition: Dysphagia 3 diet  Prophylaxis: Enoxaparin  40 mg  Disposition: Complex, ward of the state with DSS, Patient's support counselor Larnell [name] has been visiting intermittently. [phone])  Full code        Subjective: Eating breakfast  Physical Exam: Vitals:   10/29/24 2107 10/30/24 0047 10/30/24 0437 10/30/24 0500  BP: 119/88 110/76 112/83   Pulse:  65 (!) 58 (!) 55   Resp:  18 18   Temp:  98.1 F (36.7 C) 98 F (36.7 C)   TempSrc:       SpO2:  96% 95%   Weight:    81.1 kg  Height:       General Appearance:  in no acute distress Skin:  there are no suspicious lesions or rashes of concern Head/face: Incisions, deformity Eyes:  No gross abnormalities. Ears:  exam deferred Nose/Sinuses:  negative Mouth/Throat:  Mucosa moist, no lesions; pharynx without erythema, edema or exudate. Neck:  neck- supple, no mass, non-tender Lungs:  Normal expansion.  Clear to auscultation.  No rales, rhonchi, or wheezing. Heart:  Heart sounds are normal.  Regular rate and rhythm without murmur, gallop or rub. Abdomen:  Soft, non-tender, normal bowel sounds; no bruits, organomegaly or masses. Extremities: no edema Musculoskeletal:  negative Peripheral Pulses: Deferred Neurologic:  negative Psych exam: Deferred  Data Reviewed:  There are no new results to review at this time.  Family Communication: Patient's support counselor Larnell [name] has been visiting intermittently. [phone])  Disposition: Status is: Inpatient Remains inpatient appropriate because: Difficult placement with complex social situation, ward of the state with DSS  Planned Discharge Destination: Barriers to discharge: Ward of the state with DSS     Author: Maybelle JAYSON Pelton, MD 10/30/2024 9:42 AM  For on call review [email or web address].  "

## 2024-10-31 NOTE — Plan of Care (Signed)
" °  Problem: Education: Goal: Knowledge of General Education information will improve Description: Including pain rating scale, medication(s)/side effects and non-pharmacologic comfort measures Outcome: Progressing   Problem: Clinical Measurements: Goal: Will remain free from infection Outcome: Progressing Goal: Respiratory complications will improve Outcome: Progressing Goal: Cardiovascular complication will be avoided Outcome: Progressing   Problem: Activity: Goal: Risk for activity intolerance will decrease Outcome: Progressing   Problem: Elimination: Goal: Will not experience complications related to bowel motility Outcome: Progressing Goal: Will not experience complications related to urinary retention Outcome: Progressing   Problem: Pain Managment: Goal: General experience of comfort will improve and/or be controlled Outcome: Progressing   Problem: Safety: Goal: Ability to remain free from injury will improve Outcome: Progressing   "

## 2024-10-31 NOTE — Progress Notes (Signed)
 " Progress Note   Patient: Ernest Romero FMW:[id number] DOB: [birth year] DOA: 07/15/2024     108 DOS: the patient was seen and examined on 10/31/2024   Brief hospital course: SIGIFREDO Romero is a 59 y.o. male with a history of alcohol abuse, bipolar disorder, panic disorder.  Patient presented secondary to being found down and found to have evidence of a large subdural hematoma, undergoing a left frontoparietal craniotomy with evacuation of the subdural hematoma and placement of a drain and subsequent removal. Patient required further management of his craniotomy and flap, which necessitated transfer from [facility] to [facility], where neurosurgery performed a left-sided allograft cranioplasty. Repeat CT head was significant for worsening fluid, requiring a left frontal burr hole for evacuation on 10/30. 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 Patient underwent a left bone flap removal and was treated with IV antibiotics per infectious disease recommendations on 10/08/2024 for total of 8 weeks of antibiotics.  Patient is currently a ward of the state with DSS and does not have any bed offers and is difficult to place.  10/27 crani w/ flap replacement 10/29: off clevi, liberate SBP goal, Klonopin , lithium , Haldol  as needed discontinued.  Latuda  dose cut by half 10/30: repeat CT head with increased collection - going to OR this afternoon for evacuation 10/31: OR  for SDH evacuation.  Developed arm twitching.  Klonopin  restarted at half dose 11/1: More awake.  No complaint 11/2 transferred out of ICU. 11/4 cortrak removed 11/8 CT head showed fluid collection under bone flap, decision made to remove bone flap 11/10 to OR for L bone flap removal 11/11 repeat CT head stable 11/29: notice to have worsening Dysphagia.  12/1 repeat CT head: stable.  12/3: Cortrack placed for nutrition, sutures removed by  neurosurgery.  Tube feeds started. 12/4: Very sedated, psychiatry requested to readjust meds, reduced clonazepam  to0.5mg  BID, decreased valbenazine  to 40 mg daily 12/5: Noted oriented but jaw tremors worse.  Evaluated by neurology, recommended psych to continue to follow and adjust medications  Assessment and Plan:  #) Recurrent subdural hematoma: See above for procedures, underwent craniectomy and bur hole procedure.  Appears to be neurologically stable  #) MRSA infected bone flap: Completed total of 8 weeks of antibiotics, IV vancomycin  completed on 09/23/2024 and additional 2 weeks of doxycycline  completed on 10/18/2024.  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  Will need significant community support and resources.  #) Bipolar disorder: - Continue valbenazine  40 mg daily, benztropine  1 mg twice daily  #) Drug-induced parkinsonism/akathisia: Secondary to antipsychotic use.  No evidence of seizure. - Continue propranolol  20 mg twice daily, clonazepam  0.5 mg 3 times daily, valbenazine   #) Dysphagia: Required NG tube feeding from 12/3 to 09/09/2024 - Continue dysphagia 3 diet  #) Hypertension: - Continue propranolol  20 mg twice daily, amlodipine , losartan   #) Iron deficiency anemia: - Continue oral iron supplementation  #) Acute hypoxic respiratory failure secondary to aspiration pneumonia: Completed a course of Unasyn   Fluids: None Electrolytes: Check intermittently Nutrition: Dysphagia 3 diet  Prophylaxis: Enoxaparin  40 mg  Disposition: Complex, ward of the state with DSS, Patient's support counselor Larnell [name] has been visiting intermittently. [phone])  Full code        Subjective: Laying in bed after eating breakfast  Physical Exam: Vitals:   10/30/24 1542 10/30/24 2037 10/31/24 0401 10/31/24 0741  BP: 98/69 128/85  124/86  Pulse: 67 61  61  Resp: 16 18  15   Temp: 98.2 F (36.8 C) 97.6 F (36.4 C)  98  F (36.7 C)  TempSrc: Oral     SpO2: 98% 95%  96%  Weight:   81.9 kg   Height:       General Appearance:  in no acute distress Skin:  there are no suspicious lesions or rashes of concern Head/face: Incisions, deformity Eyes:  No gross abnormalities. Ears:  exam deferred Nose/Sinuses:  negative Mouth/Throat:  Mucosa moist, no lesions; pharynx without erythema, edema or exudate. Neck:  neck- supple, no mass, non-tender Lungs:  Normal expansion.  Clear to auscultation.  No rales, rhonchi, or wheezing. Heart:  Heart sounds are normal.  Regular rate and rhythm without murmur, gallop or rub. Abdomen:  Soft, non-tender, normal bowel sounds; no bruits, organomegaly or masses. Extremities: no edema Musculoskeletal:  negative Peripheral Pulses: Deferred Neurologic:  negative Psych exam: Deferred  Data Reviewed:  There are no new results to review at this time.  Family Communication: Patient's support counselor Larnell [name] has been visiting intermittently. [phone])  Disposition: Status is: Inpatient Remains inpatient appropriate because: Difficult placement with complex social situation, ward of the state with DSS  Planned Discharge Destination: Barriers to discharge: Ward of the state with DSS     Author: Maybelle JAYSON Pelton, MD 10/31/2024 9:56 AM  For on call review [email or web address].  "

## 2024-10-31 NOTE — Progress Notes (Signed)
 Physical Therapy Treatment Patient Details Name: Ernest Romero MRN: [id number] DOB: 08/04/1966 Today's Date: 10/31/2024   History of Present Illness 59 y.o. male presenting 07/16/24 from [facility] where he was [facility]. 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 On CIWA protocol. Transferred to [facility] for further management of craniotomy and flap. 10/27 s/p cranioplasty with bone flap replacement. 10/28 Change in status thought to be related to Klonopin  administration 10/28, repeat CTH shows significant increase in size of mixed attenuation subdural fluid collection underlying the L tempoparietal craniotomy site, now measuring approximately 15 mm in thickness, with associated mild mass effect and slight rightward midline shift. OR on 10/30 for SDH evacuation. [facility] 11/7 with fluid collected under bone flap. 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 Worsening dysphagia 11/29; repeat CTH stable 12/1. Cortrak placed 12/3-12/8. PMH includes alcohol use disorder, psychiatric disorder, HTN.    PT Comments  Pt received in supine and agreeable to session. Pt demonstrates improved stability during gait with no noted shoe rubbing this session. Session focused on BLE strengthening and balance challenges with pt requiring intermittent min A for balance and cues for techniuqe. Pt continues to benefit from PT services to progress toward functional mobility goals.    If plan is discharge home, recommend the following: Assistance with cooking/housework;Direct supervision/assist for medications management;Direct supervision/assist for financial management;Assist for transportation;Help with stairs or ramp for entrance;Supervision due to cognitive status;A little help with walking and/or transfers;A little help with bathing/dressing/bathroom   Can travel by private vehicle     Yes   Equipment Recommendations  None recommended by PT    Recommendations for Other Services       Precautions / Restrictions Precautions Precautions: Fall Recall of Precautions/Restrictions: Impaired Precaution/Restrictions Comments: helmet (pt to wear when OOB)     Mobility  Bed Mobility Overal bed mobility: Modified Independent                  Transfers Overall transfer level: Modified independent Equipment used: None Transfers: Sit to/from Stand Sit to Stand: Modified independent (Device/Increase time)           General transfer comment: Use of UE support, but no LOB    Ambulation/Gait Ambulation/Gait assistance: Supervision Gait Distance (Feet): 200 Feet Assistive device: None Gait Pattern/deviations: Step-through pattern, Decreased stride length, Narrow base of support       General Gait Details: No shoes rubbing together noted this session. Fair stability with supervision for safety   Stairs             Wheelchair Mobility     Tilt Bed    Modified Rankin (Stroke Patients Only)       Balance Overall balance assessment: Mild deficits observed, not formally tested Sitting-balance support: No upper extremity supported, Feet supported Sitting balance-Leahy Scale: Good     Standing balance support: No upper extremity supported, During functional activity Standing balance-Leahy Scale: Fair Standing balance comment: supervision no AD               High Level Balance Comments: cone taps, side stepping over cones            Communication Communication Communication: Impaired Factors Affecting Communication: Difficulty expressing self  Cognition Arousal: Alert Behavior During Therapy: WFL for tasks assessed/performed   PT - Cognitive impairments: Memory, Safety/Judgement, Attention  Rancho Levels of Cognitive Functioning Rancho Los Amigos Scales of Cognitive Functioning: Automatic, Appropriate:  Minimal Assistance for Daily Living Skills Rancho Los Amigos Scales of Cognitive Functioning: Automatic, Appropriate: Minimal Assistance for Daily Living Skills [VII]   Following commands: Impaired Following commands impaired: Only follows one step commands consistently, Follows multi-step commands with increased time    Cueing Cueing Techniques: Verbal cues, Visual cues  Exercises Other Exercises Other Exercises: squats x10 Other Exercises: Forward BLE step ups x10 each Other Exercises: Lateral BLE step ups x10 each    General Comments        Pertinent Vitals/Pain Pain Assessment Pain Assessment: No/denies pain     PT Goals (current goals can now be found in the care plan section) Acute Rehab PT Goals Patient Stated Goal: rehab PT Goal Formulation: With patient Time For Goal Achievement: 11/26/24 Progress towards PT goals: Progressing toward goals    Frequency    Min 2X/week       AM-PAC PT 6 Clicks Mobility   Outcome Measure  Help needed turning from your back to your side while in a flat bed without using bedrails?: None Help needed moving from lying on your back to sitting on the side of a flat bed without using bedrails?: None Help needed moving to and from a bed to a chair (including a wheelchair)?: None Help needed standing up from a chair using your arms (e.g., wheelchair or bedside chair)?: None Help needed to walk in hospital room?: None Help needed climbing 3-5 steps with a railing? : A Little 6 Click Score: 23    End of Session Equipment Utilized During Treatment: Gait belt;Other (comment) (helmet) Activity Tolerance: Patient tolerated treatment well Patient left: in bed;with call bell/phone within reach Nurse Communication: Mobility status PT Visit Diagnosis: Other abnormalities of gait and mobility (R26.89);Other symptoms and signs involving the nervous system (R29.898)     Time: 9073-9052 PT Time Calculation (min) (ACUTE ONLY): 21  min  Charges:    $Therapeutic Exercise: 8-22 mins PT General Charges $$ ACUTE PT VISIT: 1 Visit                    Darryle George, PTA Acute Rehabilitation Services Secure Chat Preferred  Office:([phone]) [phone]    Darryle George 10/31/2024, 12:28 PM

## 2024-11-01 NOTE — Plan of Care (Signed)
" °  Problem: Education: Goal: Knowledge of General Education information will improve Description: Including pain rating scale, medication(s)/side effects and non-pharmacologic comfort measures Outcome: Progressing   Problem: Clinical Measurements: Goal: Will remain free from infection Outcome: Progressing Goal: Respiratory complications will improve Outcome: Progressing Goal: Cardiovascular complication will be avoided Outcome: Progressing   Problem: Nutrition: Goal: Adequate nutrition will be maintained Outcome: Progressing   Problem: Coping: Goal: Level of anxiety will decrease Outcome: Progressing   Problem: Elimination: Goal: Will not experience complications related to bowel motility Outcome: Progressing Goal: Will not experience complications related to urinary retention Outcome: Progressing   Problem: Pain Managment: Goal: General experience of comfort will improve and/or be controlled Outcome: Progressing   Problem: Safety: Goal: Ability to remain free from injury will improve Outcome: Progressing   Problem: Education: Goal: Knowledge of the prescribed therapeutic regimen will improve Outcome: Progressing   Problem: Clinical Measurements: Goal: Usual level of consciousness will be regained or maintained. Outcome: Progressing   "

## 2024-11-01 NOTE — Progress Notes (Signed)
 " Progress Note   Patient: Ernest Romero FMW:[id number] DOB: 03-May-1966 DOA: 07/15/2024     109 DOS: the patient was seen and examined on 11/01/2024   Brief hospital course: Patient is a 59 year old male with past medical history significant for alcohol abuse, bipolar disorder, panic disorder.  Patient was found down, with evidence of a large subdural hematoma that required a left frontoparietal craniotomy with evacuation of the subdural hematoma and placement of a drain and subsequent removal. Patient required further management of his craniotomy and flap, which necessitated transfer from [facility] to [facility], where neurosurgery performed a left-sided allograft cranioplasty. Repeat CT head was significant for worsening fluid, requiring a left frontal burr hole for evacuation on 10/30. Hospitalization has been further complicated by development of an infected bone flap with cultures significant for MRSA. Patient underwent a left bone flap removal and was treated with IV antibiotics per infectious disease recommendations for total of 8 weeks.  Patient is currently a ward of the state with DSS and does not have any bed offers and is difficult to place.  Significant Hospital events: 07/29/2024 crani w/ flap replacement 10/29: off clevi, liberate SBP goal, Klonopin , lithium , Haldol  as needed discontinued.  Latuda  dose cut by half 10/30: repeat CT head with increased collection - going to OR this afternoon for evacuation 10/31: OR  for SDH evacuation.  Developed arm twitching.  Klonopin  restarted at half dose 11/1: More awake.  No complaint 11/2 transferred out of ICU. 11/4 cortrak removed 11/8 CT head showed fluid collection under bone flap, decision made to remove bone flap 11/10 to OR for L bone flap removal 11/11 repeat CT head stable 11/29: notice to have worsening Dysphagia.  12/1 repeat CT head: stable.  12/3: Cortrack placed for nutrition, sutures removed by  neurosurgery.  Tube feeds started. 12/4: Very sedated, psychiatry requested to readjust meds, reduced clonazepam  to0.5mg  BID, decreased valbenazine  to 40 mg daily 12/5: Noted oriented but jaw tremors worse.  Evaluated by neurology, recommended psych to continue to follow and adjust medications  11/01/2024: Patient seen.  Patient is resting quietly.  No new complaints.  Assessment and Plan: #) Recurrent subdural hematoma: See above for procedures, underwent craniectomy and bur hole procedure.  Appears to be neurologically stable 11/01/2024: Pursue disposition.  #) MRSA infected bone flap: Completed total of 8 weeks of antibiotics, IV vancomycin  completed on 09/23/2024 and additional 2 weeks of doxycycline  completed on 10/18/2024.  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  Will need significant community support and resources.  #) Bipolar disorder: - Continue valbenazine  40 mg daily, benztropine  1 mg twice daily  #) Drug-induced parkinsonism/akathisia: Secondary to antipsychotic use.  No evidence of seizure. - Continue propranolol  20 mg twice daily, clonazepam  0.5 mg 3 times daily, valbenazine   #) Dysphagia: Required NG tube feeding from 12/3 to 09/09/2024 - Continue dysphagia 3 diet  #) Hypertension: - Continue propranolol  20 mg twice daily, amlodipine , losartan  11/01/2024: Blood pressure is controlled.  #) Iron deficiency anemia: - Continue oral iron supplementation  #) Acute hypoxic respiratory failure secondary to aspiration pneumonia: Completed a course of Unasyn   Fluids: None Electrolytes: Check intermittently Nutrition: Dysphagia 3 diet  Prophylaxis: Enoxaparin  40 mg  Disposition: Complex, ward of the state with DSS, Patient's support counselor Larnell [name] has been visiting intermittently. [phone])  Full code        Subjective:  -Patient seen.  No complaints.  Resting quietly.  Physical Exam: Vitals:    10/31/24 1100 10/31/24 1649 10/31/24 2016 11/01/24 0456  BP: 130/76 102/69 112/81   Pulse: 63 (!) 57 61   Resp: 14     Temp:  97.8 F (36.6 C) 98.7 F (37.1 C)   TempSrc:      SpO2: 98% 96% 97%   Weight:    81.8 kg  Height:       General Appearance: Awake and alert.  Not in any distress.  Resting quietly.   Neck:  neck- supple Lungs: Clear to auscultation. CVS: S1-S2. Abdomen: Soft and nontender. Neuro: Awake and alert.  Moves all extremities. Extremities: No leg edema.  Data Reviewed:  There are no new results to review at this time.  Family Communication:   Disposition: Status is: Inpatient Remains inpatient appropriate because: Difficult placement with complex social situation, ward of the state with DSS  Planned Discharge Destination: Barriers to discharge: Ward of the state with DSS     Author: Leatrice LILLETTE Chapel, MD 11/01/2024 3:42 PM  For on call review [email or web address].  "

## 2024-11-01 NOTE — Progress Notes (Signed)
 Telephone call to [name] with [facility] ([phone]) for an update . She faxed patient out to several facilities / group homes. She is currently awaiting on a reply from [place] [facility] in [facility] ( All 3 facilities requested more information on the patient and possibly interested in the patient). Will follow up with [facility] CM for progression of care. KATHEE Herring RN,MHA,CCM  Supervisor of I[facility] [phone]

## 2024-11-02 NOTE — Plan of Care (Signed)
  Problem: Education: Goal: Knowledge of General Education information will improve Description: Including pain rating scale, medication(s)/side effects and non-pharmacologic comfort measures Outcome: Progressing   Problem: Health Behavior/Discharge Planning: Goal: Ability to manage health-related needs will improve Outcome: Progressing   Problem: Clinical Measurements: Goal: Ability to maintain clinical measurements within normal limits will improve Outcome: Progressing Goal: Will remain free from infection Outcome: Progressing Goal: Diagnostic test results will improve Outcome: Progressing Goal: Respiratory complications will improve Outcome: Progressing Goal: Cardiovascular complication will be avoided Outcome: Progressing   Problem: Activity: Goal: Risk for activity intolerance will decrease Outcome: Progressing   Problem: Nutrition: Goal: Adequate nutrition will be maintained Outcome: Progressing   Problem: Coping: Goal: Level of anxiety will decrease Outcome: Progressing   Problem: Elimination: Goal: Will not experience complications related to bowel motility Outcome: Progressing Goal: Will not experience complications related to urinary retention Outcome: Progressing   Problem: Pain Managment: Goal: General experience of comfort will improve and/or be controlled Outcome: Progressing   Problem: Safety: Goal: Ability to remain free from injury will improve Outcome: Progressing   Problem: Skin Integrity: Goal: Risk for impaired skin integrity will decrease Outcome: Progressing   Problem: Education: Goal: Knowledge of the prescribed therapeutic regimen will improve Outcome: Progressing   Problem: Clinical Measurements: Goal: Usual level of consciousness will be regained or maintained. Outcome: Progressing Goal: Neurologic status will improve Outcome: Progressing Goal: Ability to maintain intracranial pressure will improve Outcome: Progressing    Problem: Skin Integrity: Goal: Demonstration of wound healing without infection will improve Outcome: Progressing

## 2024-11-03 NOTE — Plan of Care (Signed)
  Problem: Education: Goal: Knowledge of General Education information will improve Description: Including pain rating scale, medication(s)/side effects and non-pharmacologic comfort measures Outcome: Adequate for Discharge   

## 2024-11-03 NOTE — Plan of Care (Signed)
  Problem: Education: Goal: Knowledge of General Education information will improve Description: Including pain rating scale, medication(s)/side effects and non-pharmacologic comfort measures Outcome: Progressing   Problem: Health Behavior/Discharge Planning: Goal: Ability to manage health-related needs will improve Outcome: Progressing   Problem: Clinical Measurements: Goal: Ability to maintain clinical measurements within normal limits will improve Outcome: Progressing Goal: Will remain free from infection Outcome: Progressing Goal: Diagnostic test results will improve Outcome: Progressing Goal: Respiratory complications will improve Outcome: Progressing Goal: Cardiovascular complication will be avoided Outcome: Progressing   Problem: Activity: Goal: Risk for activity intolerance will decrease Outcome: Progressing   Problem: Nutrition: Goal: Adequate nutrition will be maintained Outcome: Progressing   Problem: Coping: Goal: Level of anxiety will decrease Outcome: Progressing   Problem: Elimination: Goal: Will not experience complications related to bowel motility Outcome: Progressing Goal: Will not experience complications related to urinary retention Outcome: Progressing   Problem: Pain Managment: Goal: General experience of comfort will improve and/or be controlled Outcome: Progressing   Problem: Safety: Goal: Ability to remain free from injury will improve Outcome: Progressing   Problem: Skin Integrity: Goal: Risk for impaired skin integrity will decrease Outcome: Progressing   Problem: Education: Goal: Knowledge of the prescribed therapeutic regimen will improve Outcome: Progressing   Problem: Clinical Measurements: Goal: Usual level of consciousness will be regained or maintained. Outcome: Progressing Goal: Neurologic status will improve Outcome: Progressing Goal: Ability to maintain intracranial pressure will improve Outcome: Progressing    Problem: Skin Integrity: Goal: Demonstration of wound healing without infection will improve Outcome: Progressing

## 2024-11-03 NOTE — Progress Notes (Signed)
 " Progress Note   Patient: Ernest Romero FMW:[id number] DOB: 20-Dec-1965 DOA: 07/15/2024     111 DOS: the patient was seen and examined on 11/03/2024   Brief hospital course: Patient is a 59 year old male with past medical history significant for alcohol abuse, bipolar disorder, panic disorder.  Patient was found down, with evidence of a large subdural hematoma that required a left frontoparietal craniotomy with evacuation of the subdural hematoma and placement of a drain and subsequent removal. Patient required further management of his craniotomy and flap, which necessitated transfer from [facility] to [facility], where neurosurgery performed a left-sided allograft cranioplasty. Repeat CT head was significant for worsening fluid, requiring a left frontal burr hole for evacuation on 10/30. Hospitalization has been further complicated by development of an infected bone flap with cultures significant for MRSA. Patient underwent a left bone flap removal and was treated with IV antibiotics per infectious disease recommendations for total of 8 weeks.  Patient is currently a ward of the state with DSS and does not have any bed offers and is difficult to place.  Significant Hospital events: 07/29/2024 crani w/ flap replacement 10/29: off clevi, liberate SBP goal, Klonopin , lithium , Haldol  as needed discontinued.  Latuda  dose cut by half 10/30: repeat CT head with increased collection - going to OR this afternoon for evacuation 10/31: OR  for SDH evacuation.  Developed arm twitching.  Klonopin  restarted at half dose 11/1: More awake.  No complaint 11/2 transferred out of ICU. 11/4 cortrak removed 11/8 CT head showed fluid collection under bone flap, decision made to remove bone flap 11/10 to OR for L bone flap removal 11/11 repeat CT head stable 11/29: notice to have worsening Dysphagia.  12/1 repeat CT head: stable.  12/3: Cortrack placed for nutrition, sutures removed by  neurosurgery.  Tube feeds started. 12/4: Very sedated, psychiatry requested to readjust meds, reduced clonazepam  to0.5mg  BID, decreased valbenazine  to 40 mg daily 12/5: Noted oriented but jaw tremors worse.  Evaluated by neurology, recommended psych to continue to follow and adjust medications  11/03/2024:  Patient seen.  Patient seen.  No new complaints.  No changes.    Assessment and Plan: #) Recurrent subdural hematoma: See above for procedures, underwent craniectomy and bur hole procedure.  Appears to be neurologically stable 11/03/2024: Pursue disposition.  #) MRSA infected bone flap: Completed total of 8 weeks of antibiotics, IV vancomycin  completed on 09/23/2024 and additional 2 weeks of doxycycline  completed on 10/18/2024.  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  Will need significant community support and resources.  #) Bipolar disorder: - Continue valbenazine  40 mg daily, benztropine  1 mg twice daily  #) Drug-induced parkinsonism/akathisia: Secondary to antipsychotic use.  No evidence of seizure. - Continue propranolol  20 mg twice daily, clonazepam  0.5 mg 3 times daily, valbenazine   #) Dysphagia: Required NG tube feeding from 12/3 to 09/09/2024 - Continue dysphagia 3 diet  #) Hypertension: - Continue propranolol  20 mg twice daily, amlodipine , losartan  11/01/2024: Blood pressure is controlled.  #) Iron deficiency anemia: - Continue oral iron supplementation  #) Acute hypoxic respiratory failure secondary to aspiration pneumonia: Completed a course of Unasyn   Fluids: None Electrolytes: Check intermittently Nutrition: Dysphagia 3 diet  Prophylaxis: Enoxaparin  40 mg  Disposition: Complex, ward of the state with DSS, Patient's support counselor Larnell [name] has been visiting intermittently. [phone])  Full code        Subjective:  -Patient seen.  No  complaints.  Resting quietly.  Physical Exam: Vitals:    11/02/24 0755 11/02/24 1243 11/02/24 2000 11/03/24 0344  BP: 111/77 93/63 113/84 95/71  Pulse: (!) 56 (!) 55 61 (!) 53  Resp: 17 18 18 17   Temp:  98.1 F (36.7 C) 98.4 F (36.9 C) 97.8 F (36.6 C)  TempSrc:  Oral Oral Oral  SpO2: 98% 98% 97% 97%  Weight:      Height:       General Appearance: Awake and alert.  Not in any distress.  Resting quietly.   Neck:  neck- supple Lungs: Clear to auscultation. CVS: S1-S2. Abdomen: Soft and nontender. Neuro: Awake and alert.  Moves all extremities. Extremities: No leg edema.  Data Reviewed:  There are no new results to review at this time.  Family Communication:   Disposition: Status is: Inpatient Remains inpatient appropriate because: Difficult placement with complex social situation, ward of the state with DSS  Planned Discharge Destination: Barriers to discharge: Ward of the state with DSS   Time spent: 35 minutes.  Author: Leatrice LILLETTE Chapel, MD 11/03/2024 4:29 PM  For on call review [email or web address].  "

## 2024-11-04 NOTE — Progress Notes (Signed)
 Physical Therapy Treatment Patient Details Name: Ernest Romero MRN: [id number] DOB: Jul 14, 1966 Today's Date: 11/04/2024   History of Present Illness 59 y.o. male presenting 07/16/24 from [facility] where he was admitted [facility]/30/[facility] in the jail. 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 On CIWA protocol. Transferred to [facility] for further management of craniotomy and flap. 10/27 s/p cranioplasty with bone flap replacement. 10/28 Change in status thought to be related to Klonopin  administration 10/28, repeat CTH shows significant increase in size of mixed attenuation subdural fluid collection underlying the L tempoparietal craniotomy site, now measuring approximately 15 mm in thickness, with associated mild mass effect and slight rightward midline shift. OR on 10/30 for SDH evacuation. [facility] 11/7 with fluid collected under bone flap. 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 Worsening dysphagia 11/29; repeat CTH stable 12/1. Cortrak placed 12/3-12/8. PMH includes alcohol use disorder, psychiatric disorder, HTN.    PT Comments  Continuing to work towards goals. Focus of session on balance with retro gait, side stepping and karaoke step; slow movements for side stepping/karaoke focusing on form and balance. Quick gait for retro gait in order to work on balance compensatory strategies. Pt was able to ambulate 400 ft and worked on form in order to improve fluidity of gait. Due to pt current functional status, home set up and available assistance at home recommending skilled physical therapy services 3x/week in order to address strength, balance and functional mobility to decrease risk for falls, injury and re-hospitalization.       If plan is discharge home, recommend the following: Assistance with cooking/housework;Direct supervision/assist for medications management;Direct supervision/assist for  financial management;Assist for transportation;Help with stairs or ramp for entrance;Supervision due to cognitive status;A little help with walking and/or transfers;A little help with bathing/dressing/bathroom   Can travel by private vehicle     Yes  Equipment Recommendations  None recommended by PT       Precautions / Restrictions Precautions Precautions: Fall Recall of Precautions/Restrictions: Impaired Precaution/Restrictions Comments: helmet (pt to wear when OOB) Restrictions Weight Bearing Restrictions Per Provider Order: No     Mobility  Bed Mobility Overal bed mobility: Modified Independent Bed Mobility: Supine to Sit           General bed mobility comments: able to get to EOB without assitance, left on EOB at end of session    Transfers Overall transfer level: Modified independent Equipment used: None Transfers: Sit to/from Stand Sit to Stand: Modified independent (Device/Increase time)           General transfer comment: no cues needed for safety or sequencing this session    Ambulation/Gait Ambulation/Gait assistance: Supervision Gait Distance (Feet): 400 Feet Assistive device: None Gait Pattern/deviations: Step-through pattern, Decreased stride length, Narrow base of support Gait velocity: slightly decreased Gait velocity interpretation: >2.62 ft/sec, indicative of community ambulatory   General Gait Details: improved step height today with forward gait, slightly improved arm swing with reminders and then light tactiles cues for improved arm swing with contralatral trunk rotation in order to improve fluidity of gait.     Balance Overall balance assessment: Mild deficits observed, not formally tested Sitting-balance support: No upper extremity supported, Feet supported Sitting balance-Leahy Scale: Good     Standing balance support: No upper extremity supported, During functional activity Standing balance-Leahy Scale: Fair Standing balance  comment: no overt LOB, Mod I for static standing  High Level Balance Comments: retro gait 2 laps of 40 ft, with big steps and wider BOS to prevent shoes rubbing and encouragement for faster movements, side stepping 2 laps 40 ft R and L. Karaoke step 2 laps 20 ft slow            Communication Communication Communication: Impaired Factors Affecting Communication: Difficulty expressing self  Cognition Arousal: Alert Behavior During Therapy: WFL for tasks assessed/performed   PT - Cognitive impairments: Memory, Safety/Judgement, Attention     Rancho Levels of Cognitive Functioning Rancho Los Amigos Scales of Cognitive Functioning: Automatic, Appropriate: Minimal Assistance for Daily Living Skills (pt is getting close to an VIII) Rancho Mirant Scales of Cognitive Functioning: Automatic, Appropriate: Minimal Assistance for Daily Living Skills [VII] (pt is getting close to an VIII) PT - Cognition Comments: Pt with occasionally running into objects on the R when first gets out of bed. Following commands: Impaired Following commands impaired: Only follows one step commands consistently, Follows multi-step commands with increased time    Cueing Cueing Techniques: Verbal cues     General Comments General comments (skin integrity, edema, etc.): no signs/symptoms of cardiac/respiratory distress during session.      Pertinent Vitals/Pain Pain Assessment Pain Assessment: No/denies pain     PT Goals (current goals can now be found in the care plan section) Acute Rehab PT Goals Patient Stated Goal: rehab PT Goal Formulation: With patient Time For Goal Achievement: 11/26/24 Potential to Achieve Goals: Fair Additional Goals Additional Goal #1: Patient will demonstrate decreased fall risk with Berg score at least 48/56 (was 43/56 on 10/02/24) Progress towards PT goals: Progressing toward goals    Frequency    Min 2X/week      PT Plan  Continue with current  POC        AM-PAC PT 6 Clicks Mobility   Outcome Measure  Help needed turning from your back to your side while in a flat bed without using bedrails?: None Help needed moving from lying on your back to sitting on the side of a flat bed without using bedrails?: None Help needed moving to and from a bed to a chair (including a wheelchair)?: None Help needed standing up from a chair using your arms (e.g., wheelchair or bedside chair)?: None Help needed to walk in hospital room?: None Help needed climbing 3-5 steps with a railing? : A Little 6 Click Score: 23    End of Session Equipment Utilized During Treatment: Gait belt;Other (comment) (helmet) Activity Tolerance: Patient tolerated treatment well Patient left: in bed;with call bell/phone within reach Nurse Communication: Mobility status PT Visit Diagnosis: Other abnormalities of gait and mobility (R26.89);Other symptoms and signs involving the nervous system (R29.898)     Time: 8459-8390 PT Time Calculation (min) (ACUTE ONLY): 29 min  Charges:    $Therapeutic Activity: 8-22 mins $Neuromuscular Re-education: 8-22 mins PT General Charges $$ ACUTE PT VISIT: 1 Visit                     Ernest Romero, DPT, CLT  Acute Rehabilitation Services Office: [phone] (Secure chat preferred)    Ernest Romero 11/04/2024, 4:21 PM

## 2024-11-04 NOTE — Plan of Care (Signed)
  Problem: Education: Goal: Knowledge of General Education information will improve Description: Including pain rating scale, medication(s)/side effects and non-pharmacologic comfort measures Outcome: Adequate for Discharge   

## 2024-11-05 NOTE — Plan of Care (Signed)
  Problem: Education: Goal: Knowledge of General Education information will improve Description: Including pain rating scale, medication(s)/side effects and non-pharmacologic comfort measures Outcome: Progressing   Problem: Health Behavior/Discharge Planning: Goal: Ability to manage health-related needs will improve Outcome: Progressing   Problem: Clinical Measurements: Goal: Ability to maintain clinical measurements within normal limits will improve Outcome: Progressing Goal: Will remain free from infection Outcome: Progressing Goal: Diagnostic test results will improve Outcome: Progressing Goal: Respiratory complications will improve Outcome: Progressing Goal: Cardiovascular complication will be avoided Outcome: Progressing   Problem: Activity: Goal: Risk for activity intolerance will decrease Outcome: Progressing   Problem: Nutrition: Goal: Adequate nutrition will be maintained Outcome: Progressing   Problem: Coping: Goal: Level of anxiety will decrease Outcome: Progressing   Problem: Elimination: Goal: Will not experience complications related to bowel motility Outcome: Progressing Goal: Will not experience complications related to urinary retention Outcome: Progressing   Problem: Pain Managment: Goal: General experience of comfort will improve and/or be controlled Outcome: Progressing   Problem: Safety: Goal: Ability to remain free from injury will improve Outcome: Progressing   Problem: Skin Integrity: Goal: Risk for impaired skin integrity will decrease Outcome: Progressing   Problem: Education: Goal: Knowledge of the prescribed therapeutic regimen will improve Outcome: Progressing   Problem: Clinical Measurements: Goal: Usual level of consciousness will be regained or maintained. Outcome: Progressing Goal: Neurologic status will improve Outcome: Progressing Goal: Ability to maintain intracranial pressure will improve Outcome: Progressing    Problem: Skin Integrity: Goal: Demonstration of wound healing without infection will improve Outcome: Progressing

## 2024-11-05 NOTE — Progress Notes (Signed)
 Nutrition Follow-up  DOCUMENTATION CODES:   Severe malnutrition in context of acute illness/injury  INTERVENTION:  - Dysphagia 3 diet with thin liquids. - Discontinue Ensure Plus High Protein and Magic Cup - patient consuming 75-100% of meals and with notable weight gain during admission.  - Continue multivitamin, thiamine , iron, and folic acid  daily.  - RD will follow-up every 2 weeks. Please consult if needed acutely.   NUTRITION DIAGNOSIS:   Severe Malnutrition related to acute illness as evidenced by severe muscle depletion, moderate muscle depletion, percent weight loss. *ongoing  GOAL:   Patient will meet greater than or equal to 90% of their needs *likely met  MONITOR:   PO intake, Supplement acceptance, Diet advancement, Labs, Weight trends  REASON FOR ASSESSMENT:   Malnutrition Screening Tool, NPO/Clear Liquid Diet    ASSESSMENT:   PMH significant for ETOH abuse, HTN, bipolar disorder, anxiety/depression, hepatitis, craniotomy 06/30/24 for SDH during incarceration, presented on 10/13 for management of craniotomy s/p craniotomy with flap replacement 10/27.  09/28 admitted to [facility] with SDH s/p post prontoparietal craniotomy with evacuation 10/13 transferred to [facility] 10/27 s/p L allograft cranioplasty with flap replacement 10/29 Cortrak tube placed; xray with tip in descending duodenum  10/31 OR for SDH evacuation; diet advanced to Dysphagia 3; eating 75% of meals 11/02 Tube feeds discontinued  11/03 Transferred to progressive  11/04 Cortrak removed 11/10 OR for L bone flap removal, transferred to ICU 11/12 Continues to eat 75-100% meals, SLP passed patient for regular solids but patient prefers bite-sized Dys3 diet 11/25 Continued >75% meal intake charted 11/28 Diet changed to NPO due to coughing with medications 11/29 Failed MBS, SLP recommends continued NPO 12/01 Continues to fail swallow, NPO continues, repeat CT head due to increased drowsiness - results  benign 12/02 Failed SLP eval again 12/03 Failed repeat MBS; Cortrak placed 12/08 Cortrak removed. Repeat MBS - SLP suspected Cortrak affecting patient's swallow, patient insists on resuming PO anyway. Dys 3, thin liquids. Patient has great appetite.   Patient remains on a DYS 3 diet and tolerating well. He is consuming 80-100% of meals.   On assist with ordering and has been ordering 3 meals a day.   He is ordered Ensure twice daily and consuming one to two times a day. Accepted both today. Also ordered Magic Cup TID.  Patient noted to have weight gain during lengthy admission (148# to current weight of 175#). Will discontinue Ensure and Magic Cup given patient eating very well and with significant weight gain during admission.   Discharge still pending SNF bed availability. The patient continues to eat well. Will to follow up every 2 weeks.   Admit weight: 148# Current weight: 175#  Medications reviewed and include: Colace, Miralax , 1mg  folic acid , MVI, 100mg  thiamine , Remeron   Labs reviewed:  No BMP since 1/27   Diet Order:   Diet Order             DIET DYS 3 Room service appropriate? Yes with Assist; Fluid consistency: Thin  Diet effective now                   EDUCATION NEEDS:  Education needs have been addressed  Skin:  Skin Assessment: Reviewed RN Assessment Skin Integrity Issues:: Incisions Incisions: -  Last BM:  2/1  Height:  Ht Readings from Last 1 Encounters:  10/19/24 5' 10 (1.778 m)   Weight:  Wt Readings from Last 1 Encounters:  11/04/24 79.5 kg   Ideal Body Weight:  75.5 kg  BMI:  Body mass index is 25.15 kg/m.  Estimated Nutritional Needs:  Kcal:  2100-2400 Protein:  110-130 Fluid:  2100-2400    Trude Ned RD, LDN Contact via Secure Chat.

## 2024-11-05 NOTE — Progress Notes (Signed)
 " PROGRESS NOTE WAYLYN TENBRINK  FMW:[id number] DOB: 1966/04/16 DOA: 07/15/2024 PCP: [name]  Brief Narrative/Hospital Course: MIVAAN CORBITT is a 59 y.o. male with a history of alcohol abuse, bipolar disorder, panic disorder.  Patient presented secondary to being found down and found to have evidence of a large subdural hematoma, s/p left frontoparietal craniotomy with evacuation of the subdural hematoma and placement of a drain and subsequent removal. Patient required further management of his craniotomy and flap, which necessitated transfer from [facility] to [facility], where neurosurgery performed a leftsided allograft cranioplasty. Repeat CT head was significant for worsening fluid, requiring a left frontal burr hole for evacuation on 10/30. 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 Patient underwent a left bone flap removal and was treated with IV antibiotics for total of 8 weeks. Patient is currently a ward of the state with DSS and does not have any bed offers and is difficult to place.    Significant Hospital events: 07/29/2024 crani w/ flap replacement 10/29: off clevi, liberate SBP goal, Klonopin , lithium , Haldol  as needed discontinued.  Latuda  dose cut by half 10/30: repeat CT head with increased collection  going to OR this afternoon for evacuation 10/31: OR  for SDH evacuation.  Developed arm twitching.  Klonopin  restarted at half dose 11/1: More awake.  No complaint 11/2 transferred out of ICU. 11/4 cortrak removed 11/8 CT head showed fluid collection under bone flap, decision made to remove bone flap 11/10 to OR for L bone flap removal 11/11 repeat CT head stable 11/29: notice to have worsening Dysphagia.  12/1 repeat CT head: stable.  12/3: Cortrack placed for nutrition, sutures removed by neurosurgery.  Tube feeds started. 12/4: Very sedated, psychiatry  requested to readjust meds, reduced clonazepam  to0.5mg  BID, decreased valbenazine  to 40 mg daily 12/5: Noted oriented but jaw tremors worse.  Evaluated by neurology, recommended psych to continue to follow and adjust medications  Subjective: Seen and examined today Patient is having his meal he is alert awake oriented not in distress  Reports she has been mobilizing Overnight afebrile vitals stable, on room air, Labsreviewed from 1/27 with a stable CMP CBC although mild elevated ALT  Meds reviewed: -remains on Cogentin , Klonopin  3 times daily, Remeron  15 mg bedtime Ingrezza  daily and inderal . On folate, iron supplement multivitamin, PPI, NicoDerm, and bowel regimen On as needed melatonin, p.o. Ativan  last use 1/23  Assessment and plan:  Recurrent subdural hematoma: s/p left frontoparietal craniotomy with evacuation, and placement of a drain and subsequent removal. See above for procedure.  Neurologically stable and neurosurgery signed off.    MRSA infected bone flap: S/p 8 weeks of antibiotics, IV vancomycin   through 09/23/2024 and additional 2 weeks of doxycycline  completed on 10/18/2024.   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 Cont propranolol  , clonazepam , valbenazine    Dysphagia: Required NG tube feeding from 12/3 to 09/09/2024 now on DYS 3 diet   Hypertension: Well-controlled on propranolol , amlodipine , losartan    IDA: Stable, continue oral iron supplementation   Acute hypoxic respiratory failure secondary to aspiration pneumonia: Resolved, treated with Unasyn .  On room air currently   Deconditioning/debility Disposition  Complex, ward of the  state with DSS, Patient's  support counselor Larnell [name] has been visiting intermittently. ([phone] PT Orders: Active PT Follow up Rec: Home Health Pt2/11/2024 1620   DVT prophylaxis: enoxaparin  (LOVENOX ) injection 40 mg Start: 10/01/24 1400 Place TED hose Start: 08/12/24 1150 SCDs Start: 08/01/24 1745 Place and maintain sequential compression device Start: 07/29/24 1819 Code Status:   Code Status: Full Code Family Communication: plan of care discussed with patient at bedside. Patient status is: Remains hospitalized because of severity of illness Level of care: Med-Surg   Objective: Vitals last 24 hrs: Vitals:   11/04/24 2101 11/05/24 0014 11/05/24 0427 11/05/24 0750  BP: 112/85 103/62 110/79 (!) 116/93  Pulse: 65 (!) 58 (!) 51 (!) 52  Resp: 16 18 18 15   Temp: 98.1 F (36.7 C) 98 F (36.7 C) 97.7 F (36.5 C)   TempSrc:      SpO2: 96% 95% 95% 97%  Weight:      Height:        Physical Examination: General exam: alert awake, oriented, pleasant and able to interact and follow commands HEENT:Oral mucosa moist, Ear/Nose WNL grossly Respiratory system: Bilaterally clear BS,no use of accessory muscle Cardiovascular system: S1 & S2 +, No JVD. Gastrointestinal system: Abdomen soft,NT,ND, BS+ Nervous System: Alert, awake, moving all extremities,and following commands. Extremities: extremities warm, leg edema NEG Skin: Warm, no rashes MSK: Normal muscle bulk,tone, power   Medications reviewed:  Scheduled Meds:  benztropine   1 mg Oral BID   clonazePAM   0.5 mg Oral TID   docusate  100 mg Oral Daily   enoxaparin  (LOVENOX ) injection  40 mg Subcutaneous Daily   feeding supplement  237 mL Oral BID BM   ferrous sulfate   325 mg Oral Q breakfast   folic acid   1 mg Oral Daily   gabapentin   200 mg Oral TID   mirtazapine   15 mg Oral QHS   multivitamin with minerals  1 tablet Oral Daily   nicotine   14 mg Transdermal Daily   pantoprazole   40 mg Oral QHS   polyethylene glycol  17 g Oral Daily   propranolol   20  mg Oral BID   senna  2 tablet Oral Daily   thiamine   100 mg Oral Daily   valbenazine   40 mg Oral Daily   Continuous Infusions: Diet: Diet Order             DIET DYS 3 Room service appropriate? Yes with Assist; Fluid consistency: Thin  Diet effective now                    Unresulted Labs (From admission, onward)    None      Data Reviewed: I have personally reviewed following labs and imaging studies ( see epic result tab) CBC:No results for input(s): WBC, NEUTROABS, HGB, HCT, MCV, PLT in the last 168 hours. CMP:No results for input(s): NA, K, CL, CO2, GLUCOSE, BUN, CREATININE, CALCIUM, MG, PHOS in the last 168 hours. GFR: Estimated Creatinine Clearance: 76.5 mL/min (by C-G formula based on SCr of 1.06 mg/dL). No results for input(s): AST, ALT, ALKPHOS, BILITOT, PROT, ALBUMIN  in the last 168 hours. No results for input(s): LIPASE, AMYLASE in the last 168 hours. No results for input(s): AMMONIA in the last 168 hours. Coagulation Profile: No results for input(s): INR, PROTIME in the last 168 hours. Antimicrobials/Microbiology: Anti-infectives (From admission, onward)    Start     Dose/Rate Route Frequency Ordered Stop   09/24/24 1000  doxycycline  (VIBRA -TABS) tablet 100  mg  Status:  Discontinued        100 mg Oral Every 12 hours 09/21/24 1455 09/23/24 1142   09/24/24 1000  doxycycline  (VIBRA -TABS) tablet 100 mg        100 mg Oral Every 12 hours 09/23/24 1142 10/07/24 2003   09/13/24 1000  vancomycin  (VANCOCIN ) IVPB 1000 mg/200 mL premix        1,000 mg 200 mL/hr over 60 Minutes Intravenous Every 12 hours 09/13/24 0835 09/24/24 0559   09/02/24 1700  Ampicillin -Sulbactam (UNASYN ) 3 g in sodium chloride  0.9 % 100 mL IVPB        3 g 200 mL/hr over 30 Minutes Intravenous Every 6 hours 09/02/24 1611 09/07/24 1230   08/31/24 1000  vancomycin  (VANCOREADY) IVPB 750 mg/150 mL  Status:  Discontinued        750 mg 150 mL/hr over 60  Minutes Intravenous Every 12 hours 08/31/24 0015 09/13/24 0835   08/21/24 0800  vancomycin  (VANCOCIN ) IVPB 1000 mg/200 mL premix  Status:  Discontinued        1,000 mg 200 mL/hr over 60 Minutes Intravenous Every 12 hours 08/21/24 0720 08/31/24 0015   08/19/24 0600  vancomycin  (VANCOREADY) IVPB 1250 mg/250 mL  Status:  Discontinued        1,250 mg 166.7 mL/hr over 90 Minutes Intravenous Every 12 hours 08/18/24 1818 08/21/24 0547   08/18/24 1830  vancomycin  (VANCOREADY) IVPB 500 mg/100 mL        500 mg 100 mL/hr over 60 Minutes Intravenous  Once 08/18/24 1817 08/19/24 1642   08/16/24 1600  vancomycin  (VANCOREADY) IVPB 750 mg/150 mL  Status:  Discontinued        750 mg 150 mL/hr over 60 Minutes Intravenous Every 8 hours 08/16/24 0828 08/18/24 1818   08/14/24 0800  vancomycin  (VANCOCIN ) IVPB 1000 mg/200 mL premix  Status:  Discontinued        1,000 mg 200 mL/hr over 60 Minutes Intravenous Every 8 hours 08/14/24 0016 08/16/24 0828   08/14/24 0030  vancomycin  (VANCOCIN ) IVPB 1000 mg/200 mL premix        1,000 mg 200 mL/hr over 60 Minutes Intravenous NOW 08/14/24 0016 08/14/24 0135   08/13/24 0100  vancomycin  (VANCOREADY) IVPB 750 mg/150 mL  Status:  Discontinued        750 mg 150 mL/hr over 60 Minutes Intravenous Every 12 hours 08/12/24 1230 08/14/24 0007   08/12/24 1400  ceFAZolin  (ANCEF ) IVPB 2g/100 mL premix  Status:  Discontinued        2 g 200 mL/hr over 30 Minutes Intravenous Every 8 hours 08/12/24 1149 08/12/24 1230   08/12/24 1400  cefTAZidime  (FORTAZ ) 2 g in sodium chloride  0.9 % 100 mL IVPB  Status:  Discontinued        2 g 200 mL/hr over 30 Minutes Intravenous Every 8 hours 08/12/24 1152 08/13/24 1109   08/12/24 1245  ceFAZolin  (ANCEF ) IVPB 2g/100 mL premix  Status:  Discontinued        2 g 200 mL/hr over 30 Minutes Intravenous  Once 08/12/24 1149 08/12/24 1449   08/12/24 1245  vancomycin  (VANCOREADY) IVPB 1250 mg/250 mL        1,250 mg 166.7 mL/hr over 90 Minutes Intravenous   Once 08/12/24 1152 08/12/24 1501   08/12/24 0957  vancomycin  (VANCOCIN ) powder  Status:  Discontinued          As needed 08/12/24 1008 08/12/24 1037   08/01/24 2200  ceFAZolin  (ANCEF ) IVPB 2g/100 mL premix  2 g 200 mL/hr over 30 Minutes Intravenous Every 8 hours 08/01/24 1703 08/02/24 1915   08/01/24 1530  vancomycin  (VANCOCIN ) powder  Status:  Discontinued          As needed 08/01/24 1559 08/01/24 1606   08/01/24 1421  ceFAZolin  (ANCEF ) 2-4 GM/100ML-% IVPB       Note to Pharmacy: Roslynn Birmingham: cabinet override      08/01/24 1421 08/02/24 0229   07/29/24 2200  ceFAZolin  (ANCEF ) IVPB 2g/100 mL premix        2 g 200 mL/hr over 30 Minutes Intravenous Every 8 hours 07/29/24 1859 07/30/24 1423   07/29/24 1654  vancomycin  (VANCOCIN ) powder  Status:  Discontinued          As needed 07/29/24 1655 07/29/24 1758   07/29/24 1515  ceFAZolin  (ANCEF ) IVPB 2g/100 mL premix        2 g 200 mL/hr over 30 Minutes Intravenous  Once 07/29/24 1421 07/29/24 1655         Component Value Date/Time   SDES WOUND 08/12/2024 0955   SPECREQUEST LEFT 08/12/2024 0955   CULT  08/12/2024 0955    MODERATE METHICILLIN RESISTANT STAPHYLOCOCCUS AUREUS NO ANAEROBES ISOLATED Performed at [facility], [address], [address] [place], [place] [address]    REPTSTATUS 08/17/2024 FINAL 08/12/2024 9044    Procedures: Procedures (LRB): left bone flap removal (N/A)   Mennie LAMY, MD [facility] 11/05/2024, 1:23 PM   "

## 2024-11-06 NOTE — Progress Notes (Signed)
 Telephone call to [name] with [facility] [phone]) for an update. She stated that the CM with [facility] initiated paperwork with the TBI funding to assist with placement. At this time, no beds is available. Zelvia plans to reach out to his DSS worker for assistance. KATHEE Herring RN,MHA,CCM Supervisor of [employer] [phone]

## 2024-11-06 NOTE — Progress Notes (Signed)
 " PROGRESS NOTE Ernest Romero  FMW:[id number] DOB: 05/09/1966 DOA: 07/15/2024 [facility]: [name]  Brief Narrative/Hospital Course: Ernest Romero is a 59 y.o. male with a history of alcohol abuse, bipolar disorder, panic disorder.  Patient presented secondary to being found down and found to have evidence of a large subdural hematoma, s/p left frontoparietal craniotomy with evacuation of the subdural hematoma and placement of a drain and subsequent removal. Patient required further management of his craniotomy and flap, which necessitated transfer from [facility] to [facility], where neurosurgery performed a leftsided allograft cranioplasty. Repeat CT head was significant for worsening fluid, requiring a left frontal burr hole for evacuation on 10/30. 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 Patient underwent a left bone flap removal and was treated with IV antibiotics for total of 8 weeks. Patient is currently a ward of the state with DSS and does not have any bed offers and is difficult to place.    Significant Hospital events: 07/29/2024 crani w/ flap replacement 10/29: off clevi, liberate SBP goal, Klonopin , lithium , Haldol  as needed discontinued.  Latuda  dose cut by half 10/30: repeat CT head with increased collection  going to OR this afternoon for evacuation 10/31: OR  for SDH evacuation.  Developed arm twitching.  Klonopin  restarted at half dose 11/1: More awake.  No complaint 11/2 transferred out of ICU. 11/4 cortrak removed 11/8 CT head showed fluid collection under bone flap, decision made to remove bone flap 11/10 to OR for L bone flap removal 11/11 repeat CT head stable 11/29: notice to have worsening Dysphagia.  12/1 repeat CT head: stable.  12/3: Cortrack placed for nutrition, sutures removed by neurosurgery.  Tube feeds started. 12/4: Very sedated, psychiatry  requested to readjust meds, reduced clonazepam  to0.5mg  BID, decreased valbenazine  to 40 mg daily 12/5: Noted oriented but jaw tremors worse.  Evaluated by neurology, recommended psych to continue to follow and adjust medications  Subjective: Seen and examined  Patient reports he had his breakfast and currently resting comfortably.  He has been ambulating having bowel movement no new complaints Overnight afebrile vitals stable, on room air  Meds reviewed: remains on Cogentin , Klonopin  3 times daily, Remeron  15 mg bedtime Ingrezza  daily and inderal . On folate, iron supplement multivitamin, PPI, NicoDerm, and bowel regimen On as needed melatonin, p.o. Ativan  last use 1/23  Assessment and plan:  Recurrent subdural hematoma: s/p left frontoparietal craniotomy with evacuation, and placement of a drain and subsequent removal. See above for procedure.  Neurologically stable and neurosurgery signed off.    MRSA infected bone flap: S/p 8 wk of  antibiotics, IV vancomycin   through 09/23/2024 and additional 2 weeks of doxycycline  completed on 10/18/2024.  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 Cont propranolol  , clonazepam , valbenazine    Dysphagia: Required NG tube feeding from 12/3 to 09/09/2024 now on DYS 3 diet   Hypertension: Well-controlled on propranolol , amlodipine , losartan    IDA: Stable, continue oral iron supplementation   Acute hypoxic respiratory failure secondary to aspiration pneumonia: Resolved, treated with Unasyn .  On room air currently   Deconditioning/debility Disposition  Complex, ward of the state with DSS, Patient's support counselor Ernest Romero has been visiting intermittently. ([phone] PT Orders:  Active PT Follow up Rec: Home Health Pt2/11/2024 1620   DVT prophylaxis: enoxaparin  (LOVENOX ) injection 40 mg Start: 10/01/24 1400 Place TED hose Start: 08/12/24 1150 SCDs Start: 08/01/24 1745 Place and maintain sequential compression device Start: 07/29/24 1819 Code Status:   Code Status: Full Code Family Communication: plan of care discussed with patient at bedside. Patient status is: Remains hospitalized because of severity of illness Level of care: Med-Surg   Objective: Vitals last 24 hrs: Vitals:   11/06/24 0437 11/06/24 0500 11/06/24 0836 11/06/24 1004  BP: 102/73  112/74 112/74  Pulse: (!) 52  (!) 52 (!) 52  Resp: 16  18   Temp: (!) 97.5 F (36.4 C)  98.1 F (36.7 C)   TempSrc:   Oral   SpO2: 95%  96%   Weight:  83.2 kg    Height:        Physical Examination: General exam: alert awake HEENT:Oral mucosa moist, Ear/Nose WNL grossly Respiratory system: Bilaterally clear Cardiovascular system: S1 & S2 +, No JVD. Gastrointestinal system: Abdomen soft,NT,ND, BS+ Nervous System: Alert, awake, moving all extremities Extremities: extremities warm, leg edema NEG Skin: Warm, no rashes MSK: Normal muscle bulk,tone, power   Medications reviewed:  Scheduled Meds:  benztropine   1 mg Oral BID   clonazePAM   0.5 mg Oral TID   docusate  100 mg Oral Daily   enoxaparin  (LOVENOX ) injection  40 mg Subcutaneous Daily   ferrous sulfate   325 mg Oral Q breakfast   folic acid   1 mg Oral Daily   gabapentin   200 mg Oral TID   mirtazapine   15 mg Oral QHS   multivitamin with minerals  1 tablet Oral Daily   nicotine   14 mg Transdermal Daily   pantoprazole   40 mg Oral QHS   polyethylene glycol  17 g Oral Daily   propranolol   20 mg Oral BID   senna  2 tablet Oral Daily   thiamine   100 mg Oral Daily   valbenazine   40 mg Oral Daily   Continuous Infusions: Diet: Diet Order             DIET DYS 3 Room service appropriate? Yes with Assist; Fluid consistency: Thin  Diet effective now                     Unresulted Labs (From admission, onward)    None      Data Reviewed: I have personally reviewed following labs and imaging studies ( see epic result tab) CBC:No results for input(s): WBC, NEUTROABS, HGB, HCT, MCV, PLT in the last 168 hours. CMP:No results for input(s): NA, K, CL, CO2, GLUCOSE, BUN, CREATININE, CALCIUM, MG, PHOS in the last 168 hours. GFR: Estimated Creatinine Clearance: 76.5 mL/min (by C-G formula based on SCr of 1.06 mg/dL). No results for input(s): AST, ALT, ALKPHOS, BILITOT, PROT, ALBUMIN  in the last 168 hours. No results for input(s): LIPASE, AMYLASE in the last 168 hours. No results for input(s): AMMONIA in the last 168 hours. Coagulation Profile: No results for input(s): INR, PROTIME in the last 168 hours. Antimicrobials/Microbiology: Anti-infectives (From admission, onward)    Start     Dose/Rate Route Frequency Ordered Stop   09/24/24 1000  doxycycline  (VIBRA -TABS) tablet 100 mg  Status:  Discontinued        100 mg Oral Every 12 hours 09/21/24 1455 09/23/24 1142   09/24/24 1000  doxycycline  (VIBRA -TABS) tablet 100 mg        100 mg Oral Every 12 hours  09/23/24 1142 10/07/24 2003   09/13/24 1000  vancomycin  (VANCOCIN ) IVPB 1000 mg/200 mL premix        1,000 mg 200 mL/hr over 60 Minutes Intravenous Every 12 hours 09/13/24 0835 09/24/24 0559   09/02/24 1700  Ampicillin -Sulbactam (UNASYN ) 3 g in sodium chloride  0.9 % 100 mL IVPB        3 g 200 mL/hr over 30 Minutes Intravenous Every 6 hours 09/02/24 1611 09/07/24 1230   08/31/24 1000  vancomycin  (VANCOREADY) IVPB 750 mg/150 mL  Status:  Discontinued        750 mg 150 mL/hr over 60 Minutes Intravenous Every 12 hours 08/31/24 0015 09/13/24 0835   08/21/24 0800  vancomycin  (VANCOCIN ) IVPB 1000 mg/200 mL premix  Status:  Discontinued        1,000 mg 200 mL/hr over 60 Minutes Intravenous Every 12 hours 08/21/24 0720 08/31/24 0015   08/19/24 0600   vancomycin  (VANCOREADY) IVPB 1250 mg/250 mL  Status:  Discontinued        1,250 mg 166.7 mL/hr over 90 Minutes Intravenous Every 12 hours 08/18/24 1818 08/21/24 0547   08/18/24 1830  vancomycin  (VANCOREADY) IVPB 500 mg/100 mL        500 mg 100 mL/hr over 60 Minutes Intravenous  Once 08/18/24 1817 08/19/24 1642   08/16/24 1600  vancomycin  (VANCOREADY) IVPB 750 mg/150 mL  Status:  Discontinued        750 mg 150 mL/hr over 60 Minutes Intravenous Every 8 hours 08/16/24 0828 08/18/24 1818   08/14/24 0800  vancomycin  (VANCOCIN ) IVPB 1000 mg/200 mL premix  Status:  Discontinued        1,000 mg 200 mL/hr over 60 Minutes Intravenous Every 8 hours 08/14/24 0016 08/16/24 0828   08/14/24 0030  vancomycin  (VANCOCIN ) IVPB 1000 mg/200 mL premix        1,000 mg 200 mL/hr over 60 Minutes Intravenous NOW 08/14/24 0016 08/14/24 0135   08/13/24 0100  vancomycin  (VANCOREADY) IVPB 750 mg/150 mL  Status:  Discontinued        750 mg 150 mL/hr over 60 Minutes Intravenous Every 12 hours 08/12/24 1230 08/14/24 0007   08/12/24 1400  ceFAZolin  (ANCEF ) IVPB 2g/100 mL premix  Status:  Discontinued        2 g 200 mL/hr over 30 Minutes Intravenous Every 8 hours 08/12/24 1149 08/12/24 1230   08/12/24 1400  cefTAZidime  (FORTAZ ) 2 g in sodium chloride  0.9 % 100 mL IVPB  Status:  Discontinued        2 g 200 mL/hr over 30 Minutes Intravenous Every 8 hours 08/12/24 1152 08/13/24 1109   08/12/24 1245  ceFAZolin  (ANCEF ) IVPB 2g/100 mL premix  Status:  Discontinued        2 g 200 mL/hr over 30 Minutes Intravenous  Once 08/12/24 1149 08/12/24 1449   08/12/24 1245  vancomycin  (VANCOREADY) IVPB 1250 mg/250 mL        1,250 mg 166.7 mL/hr over 90 Minutes Intravenous  Once 08/12/24 1152 08/12/24 1501   08/12/24 0957  vancomycin  (VANCOCIN ) powder  Status:  Discontinued          As needed 08/12/24 1008 08/12/24 1037   08/01/24 2200  ceFAZolin  (ANCEF ) IVPB 2g/100 mL premix        2 g 200 mL/hr over 30 Minutes Intravenous Every 8  hours 08/01/24 1703 08/02/24 1915   08/01/24 1530  vancomycin  (VANCOCIN ) powder  Status:  Discontinued          As needed 08/01/24 1559  08/01/24 1606   08/01/24 1421  ceFAZolin  (ANCEF ) 2-4 GM/100ML-% IVPB       Note to Pharmacy: Roslynn Birmingham: cabinet override      08/01/24 1421 08/02/24 0229   07/29/24 2200  ceFAZolin  (ANCEF ) IVPB 2g/100 mL premix        2 g 200 mL/hr over 30 Minutes Intravenous Every 8 hours 07/29/24 1859 07/30/24 1423   07/29/24 1654  vancomycin  (VANCOCIN ) powder  Status:  Discontinued          As needed 07/29/24 1655 07/29/24 1758   07/29/24 1515  ceFAZolin  (ANCEF ) IVPB 2g/100 mL premix        2 g 200 mL/hr over 30 Minutes Intravenous  Once 07/29/24 1421 07/29/24 1655         Component Value Date/Time   SDES WOUND 08/12/2024 0955   SPECREQUEST LEFT 08/12/2024 0955   CULT  08/12/2024 0955    MODERATE METHICILLIN RESISTANT STAPHYLOCOCCUS AUREUS NO ANAEROBES ISOLATED Performed at [facility], [address], [place], [place] [address]    REPTSTATUS 08/17/2024 FINAL 08/12/2024 9044    Procedures: Procedures (LRB): left bone flap removal (N/A)   Mennie LAMY, MD [facility] 11/06/2024, 10:55 AM   "

## 2024-11-06 NOTE — Progress Notes (Signed)
 Physical Therapy Treatment Patient Details Name: Ernest Romero MRN: [id number] DOB: 09/09/1966 Today's Date: 11/06/2024   History of Present Illness 59 y.o. male presenting 07/16/24 from [facility] where he was admitted 06/30/[facility]. 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 On CIWA protocol. Transferred to [facility] for further management of craniotomy and flap. 10/27 s/p cranioplasty with bone flap replacement. 10/28 Change in status thought to be related to Klonopin  administration 10/28, repeat CTH shows significant increase in size of mixed attenuation subdural fluid collection underlying the L tempoparietal craniotomy site, now measuring approximately 15 mm in thickness, with associated mild mass effect and slight rightward midline shift. OR on 10/30 for SDH evacuation. [facility] 11/7 with fluid collected under bone flap. 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 Worsening dysphagia 11/29; repeat CTH stable 12/1. Cortrak placed 12/3-12/8. PMH includes alcohol use disorder, psychiatric disorder, HTN.    PT Comments  PT currently Mod I for bed mobility, sit to stand and gait. Pt requires CGA for high level balance activities with side stepping R/L, fast walking and forward/retro gait with turns. No LOB this session with balance activities. Pt continues to require rest breaks due to fatigue. Due to pt current functional status, home set up and available assistance at home recommending skilled physical therapy services 3x/week in order to address strength, balance and functional mobility to decrease risk for falls, injury and re-hospitalization.       If plan is discharge home, recommend the following: Assistance with cooking/housework;Direct supervision/assist for medications management;Direct supervision/assist for financial management;Assist for transportation;Help with stairs or ramp for  entrance;Supervision due to cognitive status;A little help with walking and/or transfers;A little help with bathing/dressing/bathroom   Can travel by private vehicle     Yes  Equipment Recommendations  None recommended by PT       Precautions / Restrictions Precautions Precautions: Fall Recall of Precautions/Restrictions: Impaired Precaution/Restrictions Comments: helmet (pt to wear when OOB) Restrictions Weight Bearing Restrictions Per Provider Order: No     Mobility  Bed Mobility Overal bed mobility: Modified Independent      Transfers Overall transfer level: Modified independent Equipment used: None Transfers: Sit to/from Stand Sit to Stand: Modified independent (Device/Increase time)    Ambulation/Gait Ambulation/Gait assistance: Supervision Gait Distance (Feet): 500 Feet Assistive device: None Gait Pattern/deviations: Step-through pattern, Decreased stride length, Narrow base of support Gait velocity: slightly decreased Gait velocity interpretation: 1.31 - 2.62 ft/sec, indicative of limited community ambulator   General Gait Details: improved step height today with forward gait, slightly improved arm swing with reminders and then light tactiles cues for improved arm swing with contralatral trunk rotation in order to improve fluidity of gait.      Balance Overall balance assessment: Mild deficits observed, not formally tested, Modified Independent Sitting-balance support: No upper extremity supported, Feet supported Sitting balance-Leahy Scale: Good     Standing balance support: No upper extremity supported, During functional activity Standing balance-Leahy Scale: Fair     High Level Balance Comments: worked on fast walking, with commands for turn R/L then side stepping and then quick turns while walking with retro gait then forward gait            Communication Communication Communication: Impaired Factors Affecting Communication: Difficulty  expressing self  Cognition Arousal: Alert Behavior During Therapy: WFL for tasks assessed/performed   PT - Cognitive impairments: Memory, Safety/Judgement, Attention  Rancho Levels of Cognitive Functioning Rancho Los Amigos Scales of Cognitive Functioning: Automatic, Appropriate: Minimal Assistance for Daily Living Skills Rancho Los Amigos Scales of Cognitive Functioning: Automatic, Appropriate: Minimal Assistance for Daily Living Skills [VII] PT - Cognition Comments: improved R awareness this session pt; pt remembered his helmet once satnding before ambulating Following commands: Impaired Following commands impaired: Only follows one step commands consistently, Follows multi-step commands with increased time    Cueing Cueing Techniques: Verbal cues         Pertinent Vitals/Pain Pain Assessment Pain Assessment: No/denies pain     PT Goals (current goals can now be found in the care plan section) Acute Rehab PT Goals Patient Stated Goal: rehab PT Goal Formulation: With patient Time For Goal Achievement: 11/26/24 Potential to Achieve Goals: Fair Progress towards PT goals: Progressing toward goals    Frequency    Min 2X/week      PT Plan  Continue with current POC        AM-PAC PT 6 Clicks Mobility   Outcome Measure  Help needed turning from your back to your side while in a flat bed without using bedrails?: None Help needed moving from lying on your back to sitting on the side of a flat bed without using bedrails?: None Help needed moving to and from a bed to a chair (including a wheelchair)?: None Help needed standing up from a chair using your arms (e.g., wheelchair or bedside chair)?: None Help needed to walk in hospital room?: None Help needed climbing 3-5 steps with a railing? : A Little 6 Click Score: 23    End of Session Equipment Utilized During Treatment: Gait belt;Other (comment) (helmet) Activity Tolerance: Patient tolerated treatment  well Patient left: in bed;with call bell/phone within reach Nurse Communication: Mobility status PT Visit Diagnosis: Other abnormalities of gait and mobility (R26.89);Other symptoms and signs involving the nervous system (R29.898)     Time: 8499-8481 PT Time Calculation (min) (ACUTE ONLY): 18 min  Charges:    $Neuromuscular Re-education: 8-22 mins PT General Charges $$ ACUTE PT VISIT: 1 Visit                     Dorothyann Maier, DPT, CLT  Acute Rehabilitation Services Office: [phone] (Secure chat preferred)    Dorothyann VEAR Maier 11/06/2024, 3:25 PM

## 2024-11-06 NOTE — Plan of Care (Signed)
  Problem: Education: Goal: Knowledge of General Education information will improve Description: Including pain rating scale, medication(s)/side effects and non-pharmacologic comfort measures Outcome: Progressing   Problem: Health Behavior/Discharge Planning: Goal: Ability to manage health-related needs will improve Outcome: Progressing   Problem: Clinical Measurements: Goal: Ability to maintain clinical measurements within normal limits will improve Outcome: Progressing Goal: Will remain free from infection Outcome: Progressing Goal: Diagnostic test results will improve Outcome: Progressing Goal: Respiratory complications will improve Outcome: Progressing Goal: Cardiovascular complication will be avoided Outcome: Progressing   Problem: Activity: Goal: Risk for activity intolerance will decrease Outcome: Progressing   Problem: Nutrition: Goal: Adequate nutrition will be maintained Outcome: Progressing   Problem: Coping: Goal: Level of anxiety will decrease Outcome: Progressing   Problem: Elimination: Goal: Will not experience complications related to bowel motility Outcome: Progressing Goal: Will not experience complications related to urinary retention Outcome: Progressing   Problem: Pain Managment: Goal: General experience of comfort will improve and/or be controlled Outcome: Progressing   Problem: Safety: Goal: Ability to remain free from injury will improve Outcome: Progressing   Problem: Skin Integrity: Goal: Risk for impaired skin integrity will decrease Outcome: Progressing   Problem: Education: Goal: Knowledge of the prescribed therapeutic regimen will improve Outcome: Progressing   Problem: Clinical Measurements: Goal: Usual level of consciousness will be regained or maintained. Outcome: Progressing Goal: Neurologic status will improve Outcome: Progressing Goal: Ability to maintain intracranial pressure will improve Outcome: Progressing    Problem: Skin Integrity: Goal: Demonstration of wound healing without infection will improve Outcome: Progressing

## 2024-11-07 NOTE — Progress Notes (Signed)
 Occupational Therapy Treatment Patient Details Name: Ernest Romero MRN: [id number] DOB: 13-Feb-1966 Today's Date: 11/07/2024   History of present illness 59 y.o. male presenting 07/16/24 from [facility] where he was admitted 06/30/24 for unresponsive episode in the jail. 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 On CIWA protocol. Transferred to [facility] for further management of craniotomy and flap. 10/27 s/p cranioplasty with bone flap replacement. 10/28 Change in status thought to be related to Klonopin  administration 10/28, repeat CTH shows significant increase in size of mixed attenuation subdural fluid collection underlying the L tempoparietal craniotomy site, now measuring approximately 15 mm in thickness, with associated mild mass effect and slight rightward midline shift. OR on 10/30 for SDH evacuation. [facility] 11/7 with fluid collected under bone flap. 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 Worsening dysphagia 11/29; repeat CTH stable 12/1. Cortrak placed 12/3-12/8. PMH includes alcohol use disorder, psychiatric disorder, HTN.   OT comments  OT session focused on training in techniques for increased safety and independence with ADLs and in path finding tasks in hallway to improve attention and cognition for carryover to functional tasks. Pt currently demonstrating ability to complete ADLs and functional mobility/transfers without an AD with Mod I to Supervision for safety with pt utilizing posted checklist to ensure he has needed items for safe mobility with Mod I. Pt also participating in path finding tasks in hallway with pt consistently able to find two locations/items in a row without cues with multiple distractions present. Pt requiring intermittent Min cues to find three locations/items in a row in busy hallway. Plan for off unit path finding task in future OT session to further address memory and executive  functioning. VSS on RA. Pt participated well and is making progress toward OT goals. Acute OT to continue to follow. Continue to recommend post acute community-based placement with 24/7 supervision/assistance paired with HH OT.       If plan is discharge home, recommend the following:  Supervision due to cognitive status;Direct supervision/assist for medications management;Assistance with cooking/housework;Direct supervision/assist for financial management;Assist for transportation;Assistance with feeding;A little help with walking and/or transfers;Help with stairs or ramp for entrance;A little help with bathing/dressing/bathroom   Equipment Recommendations  None recommended by OT    Recommendations for Other Services      Precautions / Restrictions Precautions Precautions: Fall Recall of Precautions/Restrictions: Impaired (but with noted improvements since implementation of visual chaecklist of items needed for safe ambulation) Precaution/Restrictions Comments: helmet (pt to wear when OOB) Restrictions Weight Bearing Restrictions Per Provider Order: No       Mobility Bed Mobility Overal bed mobility: Modified Independent             General bed mobility comments: able to get to EOB without assistance, left on EOB at end of session    Transfers Overall transfer level: Modified independent Equipment used: None Transfers: Sit to/from Stand, Bed to chair/wheelchair/BSC Sit to Stand: Modified independent (Device/Increase time)     Step pivot transfers: Modified independent (Device/Increase time), Supervision     General transfer comment: Mod I to Supervision for safety     Balance Overall balance assessment: Mild deficits observed, not formally tested, Modified Independent Sitting-balance support: No upper extremity supported, Feet supported Sitting balance-Leahy Scale: Good     Standing balance support: No upper extremity supported, During functional activity Standing  balance-Leahy Scale: Fair  High Level Balance Comments: mild balance deficits noted, but with no overt LOB during ADLs or functional transfers/mobility without UE support this session           ADL either performed or assessed with clinical judgement   ADL Overall ADL's : Needs assistance/impaired Eating/Feeding: Modified independent;Sitting   Grooming: Wash/dry hands;Modified independent;Standing               Lower Body Dressing: Supervision/safety;Sit to/from stand Lower Body Dressing Details (indicate cue type and reason): to don socks and shoes Toilet Transfer: Modified Independent;Supervision/safety;Ambulation;Regular Toilet;Grab bars   Toileting- Clothing Manipulation and Hygiene: Modified independent;Sitting/lateral lean;Sit to/from stand       Functional mobility during ADLs: Modified independent;Supervision/safety (largely Mod I to Supervision for safety in room and Supervision for longer distances in hallway due to fatigue and mild balance deficits; without an AD)      Extremity/Trunk Assessment              Vision       Perception     Praxis     Communication Communication Communication: Impaired Factors Affecting Communication: Difficulty expressing self (requires mildly increased time for processing)   Cognition Arousal: Alert Behavior During Therapy: WFL for tasks assessed/performed Cognition: Cognition impaired     Awareness: Intellectual awareness intact, Online awareness intact Memory impairment (select all impairments): Working civil service fast streamer, Short-term memory Attention impairment (select first level of impairment): Divided attention Executive functioning impairment (select all impairments): Organization, Problem solving, Reasoning OT - Cognition Comments: Pt demonstrating ability to utilize with visual checklist posted on wall to remember/donn needed items for safe mobility. Pt initially standing without helmet, looked to  check list with no cues, and stated, oh, yeah and retrieved and don helmet without any cues needed. Pt also participating in path finding tasks infamiliar hallway with pt consistently able to find two locations/items in a row without cues with multiple distractions present. Pt requiring Min cues to find three locations/items in a row in busy, familiar hallway. Plan for off unit path finding task in future OT session to further address memeory and executive functioning.                 Following commands: Impaired Following commands impaired: Only follows one step commands consistently, Follows multi-step commands with increased time (able to follow 2-step commands consistently this session and requiring intermittent min cues and increased time to follow 3-step commands)      Cueing   Cueing Techniques: Verbal cues  Exercises      Shoulder Instructions       General Comments VSS on RA. Notified Nurse Secretary that pt's wall clock needs a new battery.    Pertinent Vitals/ Pain       Pain Assessment Pain Assessment: No/denies pain  Home Living                                          Prior Functioning/Environment              Frequency  Min 2X/week        Progress Toward Goals  OT Goals(current goals can now be found in the care plan section)  Progress towards OT goals: Progressing toward goals  Acute Rehab OT Goals Patient Stated Goal: to be able to leave the hospital soon  Plan      Co-evaluation  AM-PAC OT 6 Clicks Daily Activity     Outcome Measure   Help from another person eating meals?: None Help from another person taking care of personal grooming?: A Little Help from another person toileting, which includes using toliet, bedpan, or urinal?: A Little Help from another person bathing (including washing, rinsing, drying)?: A Little Help from another person to put on and taking off regular upper body  clothing?: A Little Help from another person to put on and taking off regular lower body clothing?: A Little 6 Click Score: 19    End of Session Equipment Utilized During Treatment: Other (comment);Gait belt (helmet)  OT Visit Diagnosis: Other abnormalities of gait and mobility (R26.89);Other symptoms and signs involving the nervous system (R29.898);Cognitive communication deficit (R41.841);Other symptoms and signs involving cognitive function Symptoms and signs involving cognitive functions:  (SDH s/p L frontoparietal craniotomy with evacuation of SDH)   Activity Tolerance Patient tolerated treatment well   Patient Left in bed;with call bell/phone within reach;Other (comment) (sitting EOB)   Nurse Communication Mobility status;Other (comment) (pt sitting EOB at end of session; pt requesting Ensure, but none on unit with RN stating she will get pt an Ensure once they are restocked)        Time: 8555-8540 OT Time Calculation (min): 15 min  Charges: OT General Charges $OT Visit: 1 Visit OT Treatments $Therapeutic Activity: 8-22 mins  Margarie Rockey HERO., OTR/L, MA Acute Rehab [phone] }  Margarie FORBES Horns 11/07/2024, 3:45 PM

## 2024-11-07 NOTE — Progress Notes (Signed)
 " PROGRESS NOTE Ernest Romero  FMW:[id number] DOB: 1966/05/03 DOA: 07/15/2024 PCP: [name]  Brief Narrative/Hospital Course: Ernest Romero is a 59 y.o. male with a history of alcohol abuse, bipolar disorder, panic disorder.  Patient presented secondary to being found down and found to have evidence of a large subdural hematoma, s/p left frontoparietal craniotomy with evacuation of the subdural hematoma and placement of a drain and subsequent removal. Patient required further management of his craniotomy and flap, which necessitated transfer from [facility] to [facility], where neurosurgery performed a leftsided allograft cranioplasty. Repeat CT head was significant for worsening fluid, requiring a left frontal burr hole for evacuation on 10/30. 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 Patient underwent a left bone flap removal and was treated with IV antibiotics for total of 8 weeks. Patient is currently a ward of the state with DSS and does not have any bed offers and is difficult to place.    Significant Hospital events: 07/29/2024 crani w/ flap replacement 10/29: off clevi, liberate SBP goal, Klonopin , lithium , Haldol  as needed discontinued.  Latuda  dose cut by half 10/30: repeat CT head with increased collection  going to OR this afternoon for evacuation 10/31: OR  for SDH evacuation.  Developed arm twitching.  Klonopin  restarted at half dose 11/1: More awake.  No complaint 11/2 transferred out of ICU. 11/4 cortrak removed 11/8 CT head showed fluid collection under bone flap, decision made to remove bone flap 11/10 to OR for L bone flap removal 11/11 repeat CT head stable 11/29: notice to have worsening Dysphagia.  12/1 repeat CT head: stable.  12/3: Cortrack placed for nutrition, sutures removed by neurosurgery.  Tube feeds started. 12/4: Very sedated, psychiatry  requested to readjust meds, reduced clonazepam  to0.5mg  BID, decreased valbenazine  to 40 mg daily 12/5: Noted oriented but jaw tremors worse.  Evaluated by neurology, recommended psych to continue to follow and adjust medications  Subjective: Seen and examined  Alert awake, resting orientedx3 Overnight afebrile vitals stable, on RA. He thinks he could go to his friend's house in [place]  Assessment and plan:  Recurrent subdural hematoma: s/p left frontoparietal craniotomy with evacuation, and placement of a drain and subsequent removal. See above for procedure.  Neurologically stable and neurosurgery signed off.    MRSA infected bone flap: S/p 8 wk of  antibiotics, IV vancomycin   through 09/23/2024 and additional 2 weeks of doxycycline  completed on 10/18/2024.  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 Cont propranolol  , clonazepam , valbenazine    Dysphagia: Required NG tube feeding from 12/3 to 09/09/2024 now on DYS 3 diet   Hypertension: Well-controlled on propranolol , amlodipine , losartan    IDA: Stable, continue oral iron supplementation   Acute hypoxic respiratory failure secondary to aspiration pneumonia: Resolved, treated with Unasyn .  On room air currently   Deconditioning/debility Disposition:  Complex, ward of the state with DSS. Patient's support counselor Larnell [name] has been visiting intermittently. ([phone] PT Orders: Active PT Follow up Rec: Home Health Pt2/01/2025 1524   DVT prophylaxis: enoxaparin  (LOVENOX ) injection 40 mg Start: 10/01/24 1400 Place TED hose Start: 08/12/24 1150 SCDs Start: 08/01/24 1745 Place and maintain sequential compression device Start: 07/29/24 1819 Code Status:  Code Status:  Full Code Family Communication: plan of care discussed with patient at bedside. Patient status is: Remains hospitalized because of severity of illness Level of care: Med-Surg   Objective: Vitals last 24 hrs: Vitals:   11/07/24 0014 11/07/24 0446 11/07/24 0500 11/07/24 0746  BP: 114/77 102/60  118/88  Pulse: (!) 57 (!) 50  64  Resp: 15 16    Temp: 97.8 F (36.6 C) (!) 97.4 F (36.3 C)  98 F (36.7 C)  TempSrc:      SpO2: 94% 97%  97%  Weight:   82.7 kg   Height:       Physical Examination: General exam: alert awake, oriented x3 HEENT:Oral mucosa moist, Ear/Nose WNL grossly Respiratory system: Bilaterally clear Cardiovascular system: S1 & S2 +, No JVD. Gastrointestinal system: Abdomen soft,NT,ND, BS+ Nervous System: Alert, awake, moving all extremities Extremities: extremities warm, leg edema NEG Skin: Warm, no rashes MSK: Normal muscle bulk,tone, power   Medications reviewed:  Scheduled Meds:  benztropine   1 mg Oral BID   clonazePAM   0.5 mg Oral TID   docusate  100 mg Oral Daily   enoxaparin  (LOVENOX ) injection  40 mg Subcutaneous Daily   ferrous sulfate   325 mg Oral Q breakfast   folic acid   1 mg Oral Daily   gabapentin   200 mg Oral TID   mirtazapine   15 mg Oral QHS   multivitamin with minerals  1 tablet Oral Daily   nicotine   14 mg Transdermal Daily   pantoprazole   40 mg Oral QHS   polyethylene glycol  17 g Oral Daily   propranolol   20 mg Oral BID   senna  2 tablet Oral Daily   thiamine   100 mg Oral Daily   valbenazine   40 mg Oral Daily   Continuous Infusions: Diet: Diet Order             DIET DYS 3 Room service appropriate? Yes with Assist; Fluid consistency: Thin  Diet effective now                    Unresulted Labs (From admission, onward)    None      Data Reviewed: I have personally reviewed following labs and imaging studies ( see epic result tab) CBC:No results for input(s): WBC, NEUTROABS, HGB, HCT, MCV, PLT in the last 168  hours. CMP:No results for input(s): NA, K, CL, CO2, GLUCOSE, BUN, CREATININE, CALCIUM, MG, PHOS in the last 168 hours. GFR: Estimated Creatinine Clearance: 76.5 mL/min (by C-G formula based on SCr of 1.06 mg/dL). No results for input(s): AST, ALT, ALKPHOS, BILITOT, PROT, ALBUMIN  in the last 168 hours. No results for input(s): LIPASE, AMYLASE in the last 168 hours. No results for input(s): AMMONIA in the last 168 hours. Coagulation Profile: No results for input(s): INR, PROTIME in the last 168 hours. Antimicrobials/Microbiology: Anti-infectives (From admission, onward)    Start     Dose/Rate Route Frequency Ordered Stop   09/24/24 1000  doxycycline  (VIBRA -TABS) tablet 100 mg  Status:  Discontinued        100 mg Oral Every 12 hours 09/21/24 1455 09/23/24 1142   09/24/24 1000  doxycycline  (VIBRA -TABS) tablet 100 mg        100 mg Oral Every 12 hours 09/23/24 1142 10/07/24 2003   09/13/24 1000  vancomycin  (VANCOCIN ) IVPB 1000 mg/200 mL premix        1,000 mg 200 mL/hr over 60 Minutes Intravenous Every 12 hours 09/13/24 0835 09/24/24 0559  09/02/24 1700  Ampicillin -Sulbactam (UNASYN ) 3 g in sodium chloride  0.9 % 100 mL IVPB        3 g 200 mL/hr over 30 Minutes Intravenous Every 6 hours 09/02/24 1611 09/07/24 1230   08/31/24 1000  vancomycin  (VANCOREADY) IVPB 750 mg/150 mL  Status:  Discontinued        750 mg 150 mL/hr over 60 Minutes Intravenous Every 12 hours 08/31/24 0015 09/13/24 0835   08/21/24 0800  vancomycin  (VANCOCIN ) IVPB 1000 mg/200 mL premix  Status:  Discontinued        1,000 mg 200 mL/hr over 60 Minutes Intravenous Every 12 hours 08/21/24 0720 08/31/24 0015   08/19/24 0600  vancomycin  (VANCOREADY) IVPB 1250 mg/250 mL  Status:  Discontinued        1,250 mg 166.7 mL/hr over 90 Minutes Intravenous Every 12 hours 08/18/24 1818 08/21/24 0547   08/18/24 1830  vancomycin  (VANCOREADY) IVPB 500 mg/100 mL        500 mg 100 mL/hr over 60 Minutes  Intravenous  Once 08/18/24 1817 08/19/24 1642   08/16/24 1600  vancomycin  (VANCOREADY) IVPB 750 mg/150 mL  Status:  Discontinued        750 mg 150 mL/hr over 60 Minutes Intravenous Every 8 hours 08/16/24 0828 08/18/24 1818   08/14/24 0800  vancomycin  (VANCOCIN ) IVPB 1000 mg/200 mL premix  Status:  Discontinued        1,000 mg 200 mL/hr over 60 Minutes Intravenous Every 8 hours 08/14/24 0016 08/16/24 0828   08/14/24 0030  vancomycin  (VANCOCIN ) IVPB 1000 mg/200 mL premix        1,000 mg 200 mL/hr over 60 Minutes Intravenous NOW 08/14/24 0016 08/14/24 0135   08/13/24 0100  vancomycin  (VANCOREADY) IVPB 750 mg/150 mL  Status:  Discontinued        750 mg 150 mL/hr over 60 Minutes Intravenous Every 12 hours 08/12/24 1230 08/14/24 0007   08/12/24 1400  ceFAZolin  (ANCEF ) IVPB 2g/100 mL premix  Status:  Discontinued        2 g 200 mL/hr over 30 Minutes Intravenous Every 8 hours 08/12/24 1149 08/12/24 1230   08/12/24 1400  cefTAZidime  (FORTAZ ) 2 g in sodium chloride  0.9 % 100 mL IVPB  Status:  Discontinued        2 g 200 mL/hr over 30 Minutes Intravenous Every 8 hours 08/12/24 1152 08/13/24 1109   08/12/24 1245  ceFAZolin  (ANCEF ) IVPB 2g/100 mL premix  Status:  Discontinued        2 g 200 mL/hr over 30 Minutes Intravenous  Once 08/12/24 1149 08/12/24 1449   08/12/24 1245  vancomycin  (VANCOREADY) IVPB 1250 mg/250 mL        1,250 mg 166.7 mL/hr over 90 Minutes Intravenous  Once 08/12/24 1152 08/12/24 1501   08/12/24 0957  vancomycin  (VANCOCIN ) powder  Status:  Discontinued          As needed 08/12/24 1008 08/12/24 1037   08/01/24 2200  ceFAZolin  (ANCEF ) IVPB 2g/100 mL premix        2 g 200 mL/hr over 30 Minutes Intravenous Every 8 hours 08/01/24 1703 08/02/24 1915   08/01/24 1530  vancomycin  (VANCOCIN ) powder  Status:  Discontinued          As needed 08/01/24 1559 08/01/24 1606   08/01/24 1421  ceFAZolin  (ANCEF ) 2-4 GM/100ML-% IVPB       Note to Pharmacy: Roslynn Birmingham: cabinet override       08/01/24 1421 08/02/24 0229   07/29/24 2200  ceFAZolin  (  ANCEF ) IVPB 2g/100 mL premix        2 g 200 mL/hr over 30 Minutes Intravenous Every 8 hours 07/29/24 1859 07/30/24 1423   07/29/24 1654  vancomycin  (VANCOCIN ) powder  Status:  Discontinued          As needed 07/29/24 1655 07/29/24 1758   07/29/24 1515  ceFAZolin  (ANCEF ) IVPB 2g/100 mL premix        2 g 200 mL/hr over 30 Minutes Intravenous  Once 07/29/24 1421 07/29/24 1655         Component Value Date/Time   SDES WOUND 08/12/2024 0955   SPECREQUEST LEFT 08/12/2024 0955   CULT  08/12/2024 0955    MODERATE METHICILLIN RESISTANT STAPHYLOCOCCUS AUREUS NO ANAEROBES ISOLATED Performed at [facility], [address] [place], [place] [address]    REPTSTATUS 08/17/2024 FINAL 08/12/2024 9044    Procedures: Procedures (LRB): left bone flap removal (N/A)   Mennie LAMY, MD [facility] 11/07/2024, 11:42 AM   "

## 2024-11-07 NOTE — Plan of Care (Signed)
  Problem: Health Behavior/Discharge Planning: Goal: Ability to manage health-related needs will improve Outcome: Progressing   Problem: Clinical Measurements: Goal: Ability to maintain clinical measurements within normal limits will improve Outcome: Progressing   Problem: Activity: Goal: Risk for activity intolerance will decrease Outcome: Progressing   Problem: Coping: Goal: Level of anxiety will decrease Outcome: Progressing   

## 2024-11-08 NOTE — Progress Notes (Signed)
 Physical Therapy Treatment Patient Details Name: Ernest Romero MRN: [id number] DOB: 1966-04-20 Today's Date: 11/08/2024   History of Present Illness 59 y.o. male presenting 07/16/24 from [facility] where he was admitted 06/30/[facility] in the jail. 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 On CIWA protocol. Transferred to [facility] for further management of craniotomy and flap. 10/27 s/p cranioplasty with bone flap replacement. 10/28 Change in status thought to be related to Klonopin  administration 10/28, repeat CTH shows significant increase in size of mixed attenuation subdural fluid collection underlying the L tempoparietal craniotomy site, now measuring approximately 15 mm in thickness, with associated mild mass effect and slight rightward midline shift. OR on 10/30 for SDH evacuation. [facility] 11/7 with fluid collected under bone flap. 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 Worsening dysphagia 11/29; repeat CTH stable 12/1. Cortrak placed 12/3-12/8. PMH includes alcohol use disorder, psychiatric disorder, HTN.    PT Comments  Pt ambulating in room upon PT arrival, agreeable to session. Pt notably anxious regarding continued time in hospital and wanting to discharge. Pt ambulates around unit 558ft while conversing with PT, answering questions, problem solving next steps after discharge. Demonstrates improved dual task while in congested areas of unit, improved attention to his right side with no collision with obstacles along hallway. Returned to room, conversing with his friend/SW on phone. Pt continues to make good functional improvements and will continue to benefit from skilled therapy to improve high level balance deficits, functional cognition, and overall safety and balance while mobilizing.     If plan is discharge home, recommend the following: Assistance with cooking/housework;Direct  supervision/assist for medications management;Direct supervision/assist for financial management;Assist for transportation;Help with stairs or ramp for entrance;Supervision due to cognitive status;A little help with walking and/or transfers;A little help with bathing/dressing/bathroom   Can travel by private vehicle     Yes  Equipment Recommendations  None recommended by PT    Recommendations for Other Services       Precautions / Restrictions Precautions Precautions: Fall Recall of Precautions/Restrictions: Impaired (continues to improve overall with checklist) Precaution/Restrictions Comments: helmet (pt to wear when OOB) Restrictions Weight Bearing Restrictions Per Provider Order: No     Mobility  Bed Mobility               General bed mobility comments: pt ambulating in room IND with helmet donned upon PT arrival    Transfers Overall transfer level: Modified independent Equipment used: None Transfers: Sit to/from Stand Sit to Stand: Modified independent (Device/Increase time)           General transfer comment: Mod I to Supervision for safety    Ambulation/Gait Ambulation/Gait assistance: Supervision Gait Distance (Feet): 500 Feet Assistive device: None Gait Pattern/deviations: Step-through pattern, Decreased stride length, Narrow base of support Gait velocity: slightly decreased     General Gait Details: continues to have small improvements with step height, arm swing and awareness to surroundings especially R side. Navigates well through congested areas with no change in gait speed. Pt does report he walks slower overall when talking with someone out of concern for safety   Stairs             Wheelchair Mobility     Tilt Bed    Modified Rankin (Stroke Patients Only)       Balance Overall balance assessment: Mild deficits observed, not formally tested, Modified Independent  Communication Communication Communication: Impaired Factors Affecting Communication: Difficulty expressing self  Cognition Arousal: Alert Behavior During Therapy: WFL for tasks assessed/performed   PT - Cognitive impairments: Memory, Safety/Judgement, Attention                   Rancho Levels of Cognitive Functioning Rancho Los Amigos Scales of Cognitive Functioning: Automatic, Appropriate: Minimal Assistance for Daily Living Skills Rancho Los Amigos Scales of Cognitive Functioning: Automatic, Appropriate: Minimal Assistance for Daily Living Skills [VII] PT - Cognition Comments: improved recall and adherence to wearing helmet when OOB, did need VC to connect strap. Improved R side awareness overall, does walk closely to things on his right but did not run into anything Following commands: Impaired Following commands impaired: Follows multi-step commands inconsistently, Follows one step commands with increased time (intermittent assistance for 2-3 step commands)    Cueing Cueing Techniques: Verbal cues  Exercises      General Comments        Pertinent Vitals/Pain Pain Assessment Pain Assessment: No/denies pain    Home Living                          Prior Function            PT Goals (current goals can now be found in the care plan section) Acute Rehab PT Goals Patient Stated Goal: rehab PT Goal Formulation: With patient Time For Goal Achievement: 11/26/24 Potential to Achieve Goals: Fair Progress towards PT goals: Progressing toward goals    Frequency    Min 2X/week      PT Plan      Co-evaluation              AM-PAC PT 6 Clicks Mobility   Outcome Measure  Help needed turning from your back to your side while in a flat bed without using bedrails?: None Help needed moving from lying on your back to sitting on the side of a flat bed without using bedrails?: None Help needed moving to and from a bed to a chair (including a  wheelchair)?: None Help needed standing up from a chair using your arms (e.g., wheelchair or bedside chair)?: None Help needed to walk in hospital room?: None Help needed climbing 3-5 steps with a railing? : A Little 6 Click Score: 23    End of Session Equipment Utilized During Treatment: Gait belt;Other (comment) (helmet) Activity Tolerance: Patient tolerated treatment well Patient left: in bed;with call bell/phone within reach Nurse Communication: Mobility status PT Visit Diagnosis: Other abnormalities of gait and mobility (R26.89);Other symptoms and signs involving the nervous system (M70.101)     Time: 8849-8796 PT Time Calculation (min) (ACUTE ONLY): 13 min  Charges:    $Neuromuscular Re-education: 8-22 mins                       Isaiah DEL. Krista Som, PT, DPT   [employer] 11/08/2024, 12:12 PM

## 2024-11-08 NOTE — Plan of Care (Signed)
  Problem: Education: Goal: Knowledge of General Education information will improve Description: Including pain rating scale, medication(s)/side effects and non-pharmacologic comfort measures Outcome: Progressing   Problem: Health Behavior/Discharge Planning: Goal: Ability to manage health-related needs will improve Outcome: Progressing   Problem: Clinical Measurements: Goal: Ability to maintain clinical measurements within normal limits will improve Outcome: Progressing Goal: Will remain free from infection Outcome: Progressing Goal: Diagnostic test results will improve Outcome: Progressing Goal: Respiratory complications will improve Outcome: Progressing Goal: Cardiovascular complication will be avoided Outcome: Progressing   Problem: Activity: Goal: Risk for activity intolerance will decrease Outcome: Progressing   Problem: Pain Managment: Goal: General experience of comfort will improve and/or be controlled Outcome: Progressing

## 2024-11-08 NOTE — TOC Progression Note (Addendum)
 Transition of Care [facility]) - Progression Note    Patient Details  Name: Ernest Romero MRN: [id number] Date of Birth: 1965/10/15  Transition of Care [facility]) CM/SW Contact  [name], [place] Phone Number: 11/08/2024, 4:50 PM  Clinical Narrative:     CSW spoke with patient regarding anticipated discharge on Monday. Patient told CSW he plans to discharge to Ernest Romero's home to live with Ernest Romero and Ernest Romero's family. CSW asked if APS worker was aware of discharge plan, to which Ernest Romero answered no. CSW called APS worker to update and APS worker was not agreeable to the plan. CSW attempted to discuss discharge plan with insurance, APS worker, and Ernest Romero, but was not successful. APS worker Nat Chang was unable to join call at the time of the meeting with Ernest Romero and Ernest Romero was not open to discussion at this time. Ernest Romero told CSW that because patient does not have a guardian, patient can make his own decisions and can leave. Patient has [employer] and [employer] designating Nat Chang as responsible person, but no guardianship paperwork. After consulting with [facility] supervisor, patient is able to discharge if he would like because he has not been deemed incompetent by the court system or assigned a legal guardian. CSW confirmed this with APS worker Nat Chang. CSW will update staff and patient.   Anticipated discharge on Monday, 2/9. Nat Chang will be coming in person to discuss discharge with Ernest Romero and patient on Monday morning. CSW will continue to follow.   Expected Discharge Plan: Home (with peer support person Ernest Romero) Barriers to Discharge: Continued Medical Work up, English As A Second Language Teacher, SNF Pending bed offer               Expected Discharge Plan and Services In-house Referral: Clinical Social Work Discharge Planning Services: CM Consult                                           Social Drivers of Health (SDOH) Interventions SDOH Screenings   Food  Insecurity: Patient Unable To Answer (07/16/2024)  Housing: Unknown (07/16/2024)  Transportation Needs: Patient Unable To Answer (07/16/2024)  Utilities: Patient Unable To Answer (07/16/2024)  Alcohol Screen: Low Risk (08/30/2022)  Depression (PHQ2-9): Medium Risk (08/30/2022)  Tobacco Use: Medium Risk (08/12/2024)    Readmission Risk Interventions    07/16/2024   12:23 PM  Readmission Risk Prevention Plan  Transportation Screening Complete  Medication Review (RN Care Manager) Complete  PCP or Specialist appointment within 3-5 days of discharge Complete  HRI or Home Care Consult Complete  SW Recovery Care/Counseling Consult Complete  Palliative Care Screening Not Applicable  Skilled Nursing Facility Not Applicable

## 2024-11-08 NOTE — Progress Notes (Signed)
 " PROGRESS NOTE Ernest Romero  FMW:[id number] DOB: July 28, 1966 DOA: 07/15/2024 PCP: [name]  Brief Narrative/Hospital Course: Ernest Romero is a 59 y.o. male with a history of alcohol abuse, bipolar disorder, panic disorder.  Patient presented secondary to being found down and found to have evidence of a large subdural hematoma, s/p left frontoparietal craniotomy with evacuation of the subdural hematoma and placement of a drain and subsequent removal. Patient required further management of his craniotomy and flap, which necessitated transfer from [facility] to [facility], where neurosurgery performed a leftsided allograft cranioplasty. Repeat CT head was significant for worsening fluid, requiring a left frontal burr hole for evacuation on 10/30. 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 Patient underwent a left bone flap removal and was treated with IV antibiotics for total of 8 weeks. Patient is currently a ward of the state with DSS and does not have any bed offers and is difficult to place.    Significant Hospital events: 07/29/2024 crani w/ flap replacement 10/29: off clevi, liberate SBP goal, Klonopin , lithium , Haldol  as needed discontinued.  Latuda  dose cut by half 10/30: repeat CT head with increased collection  going to OR this afternoon for evacuation 10/31: OR  for SDH evacuation.  Developed arm twitching.  Klonopin  restarted at half dose 11/1: More awake.  No complaint 11/2 transferred out of ICU. 11/4 cortrak removed 11/8 CT head showed fluid collection under bone flap, decision made to remove bone flap 11/10 to OR for L bone flap removal 11/11 repeat CT head stable 11/29: notice to have worsening Dysphagia.  12/1 repeat CT head: stable.  12/3: Cortrack placed for nutrition, sutures removed by neurosurgery.  Tube feeds started. 12/4: Very sedated, psychiatry  requested to readjust meds, reduced clonazepam  to0.5mg  BID, decreased valbenazine  to 40 mg daily 12/5: Noted oriented but jaw tremors worse.  Evaluated by neurology, recommended psych to continue to follow and adjust medications  Subjective: Seen and examined  Alert awake reading book. He has been ambulating himself Reports she will be going and staying with his friend-social worker coming Monday Overnight remains afebrile.  Assessment and plan:  Recurrent subdural hematoma: s/p left frontoparietal craniotomy with evacuation, and placement of a drain and subsequent removal. See above for procedure.  Neurologically stable and neurosurgery signed off.    MRSA infected bone flap: S/p 8 wk of  antibiotics, IV vancomycin   through 09/23/2024 and additional 2 weeks of doxycycline  completed on 10/18/2024.  Stable   Traumatic brain injury: Mentation overall stable, will need resources, continue supportive care.  PT OT has been working with him regularly and he has consistently shown improvement, improving memory and overall functioning.   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 Cont propranolol  , clonazepam , valbenazine    Dysphagia: Required NG tube feeding from 12/3 to 09/09/2024 now on DYS 3 diet   Hypertension: Well-controlled on propranolol , amlodipine , losartan    IDA: Stable, continue oral iron supplementation   Acute hypoxic respiratory failure secondary to aspiration pneumonia: Resolved, treated with Unasyn .  On room air currently   Deconditioning/debility Disposition:  Complex, ward of the state with DSS. Patient's support counselor Ernest Romero has been visiting intermittently. ([phone] PT Orders: Active PT Follow up Rec: Home Health Pt2/01/2025 1524   DVT prophylaxis: enoxaparin  (LOVENOX ) injection 40 mg Start: 10/01/24  1400 Place TED hose Start: 08/12/24 1150 SCDs Start: 08/01/24 1745 Place and maintain sequential compression device Start: 07/29/24 1819 Code Status:   Code Status: Full Code Family Communication: plan of care discussed with patient at bedside. Patient status is: Remains hospitalized because of severity of illness Level of care: Med-Surg   Objective: Vitals last 24 hrs: Vitals:   11/07/24 2001 11/08/24 0500 11/08/24 0509 11/08/24 0802  BP: (!) 127/95  (!) 115/97 (!) 130/93  Pulse: (!) 55  (!) 58 64  Resp: 16  20   Temp: 98.2 F (36.8 C)  98.1 F (36.7 C) 98 F (36.7 C)  TempSrc: Oral     SpO2: 96%  96% 97%  Weight:  82.1 kg    Height:       Physical Examination: General exam: AAO HEENT:Oral mucosa moist, Ear/Nose WNL grossly Respiratory system: Bilaterally clear, no use of accessory muscle Cardiovascular system: S1 & S2 +, No JVD. Gastrointestinal system: Abdomen soft,NT,ND, BS+ Nervous System: Alert, awake, nonfocal Extremities: extremities warm, leg edema NEG Skin: Warm, no rashes MSK: Normal muscle bulk,tone, power   Medications reviewed:  Scheduled Meds:  benztropine   1 mg Oral BID   clonazePAM   0.5 mg Oral TID   docusate  100 mg Oral Daily   enoxaparin  (LOVENOX ) injection  40 mg Subcutaneous Daily   ferrous sulfate   325 mg Oral Q breakfast   folic acid   1 mg Oral Daily   gabapentin   200 mg Oral TID   mirtazapine   15 mg Oral QHS   multivitamin with minerals  1 tablet Oral Daily   nicotine   14 mg Transdermal Daily   pantoprazole   40 mg Oral QHS   polyethylene glycol  17 g Oral Daily   propranolol   20 mg Oral BID   senna  2 tablet Oral Daily   thiamine   100 mg Oral Daily   valbenazine   40 mg Oral Daily   Continuous Infusions: Diet: Diet Order             DIET DYS 3 Room service appropriate? Yes with Assist; Fluid consistency: Thin  Diet effective now                    Unresulted Labs (From admission, onward)    None      Data Reviewed: I  have personally reviewed following labs and imaging studies ( see epic result tab) CBC:No results for input(s): WBC, NEUTROABS, HGB, HCT, MCV, PLT in the last 168 hours. CMP:No results for input(s): NA, K, CL, CO2, GLUCOSE, BUN, CREATININE, CALCIUM, MG, PHOS in the last 168 hours. GFR: Estimated Creatinine Clearance: 76.5 mL/min (by C-G formula based on SCr of 1.06 mg/dL). No results for input(s): AST, ALT, ALKPHOS, BILITOT, PROT, ALBUMIN  in the last 168 hours. No results for input(s): LIPASE, AMYLASE in the last 168 hours. No results for input(s): AMMONIA in the last 168 hours. Coagulation Profile: No results for input(s): INR, PROTIME in the last 168 hours. Antimicrobials/Microbiology: Anti-infectives (From admission, onward)    Start     Dose/Rate Route Frequency Ordered Stop   09/24/24 1000  doxycycline  (VIBRA -TABS) tablet 100 mg  Status:  Discontinued        100 mg Oral Every 12 hours 09/21/24 1455 09/23/24 1142   09/24/24 1000  doxycycline  (VIBRA -TABS) tablet 100 mg        100 mg Oral Every 12 hours 09/23/24 1142 10/07/24 2003   09/13/24 1000  vancomycin  (VANCOCIN ) IVPB 1000 mg/200 mL  premix        1,000 mg 200 mL/hr over 60 Minutes Intravenous Every 12 hours 09/13/24 0835 09/24/24 0559   09/02/24 1700  Ampicillin -Sulbactam (UNASYN ) 3 g in sodium chloride  0.9 % 100 mL IVPB        3 g 200 mL/hr over 30 Minutes Intravenous Every 6 hours 09/02/24 1611 09/07/24 1230   08/31/24 1000  vancomycin  (VANCOREADY) IVPB 750 mg/150 mL  Status:  Discontinued        750 mg 150 mL/hr over 60 Minutes Intravenous Every 12 hours 08/31/24 0015 09/13/24 0835   08/21/24 0800  vancomycin  (VANCOCIN ) IVPB 1000 mg/200 mL premix  Status:  Discontinued        1,000 mg 200 mL/hr over 60 Minutes Intravenous Every 12 hours 08/21/24 0720 08/31/24 0015   08/19/24 0600  vancomycin  (VANCOREADY) IVPB 1250 mg/250 mL  Status:  Discontinued        1,250 mg 166.7 mL/hr  over 90 Minutes Intravenous Every 12 hours 08/18/24 1818 08/21/24 0547   08/18/24 1830  vancomycin  (VANCOREADY) IVPB 500 mg/100 mL        500 mg 100 mL/hr over 60 Minutes Intravenous  Once 08/18/24 1817 08/19/24 1642   08/16/24 1600  vancomycin  (VANCOREADY) IVPB 750 mg/150 mL  Status:  Discontinued        750 mg 150 mL/hr over 60 Minutes Intravenous Every 8 hours 08/16/24 0828 08/18/24 1818   08/14/24 0800  vancomycin  (VANCOCIN ) IVPB 1000 mg/200 mL premix  Status:  Discontinued        1,000 mg 200 mL/hr over 60 Minutes Intravenous Every 8 hours 08/14/24 0016 08/16/24 0828   08/14/24 0030  vancomycin  (VANCOCIN ) IVPB 1000 mg/200 mL premix        1,000 mg 200 mL/hr over 60 Minutes Intravenous NOW 08/14/24 0016 08/14/24 0135   08/13/24 0100  vancomycin  (VANCOREADY) IVPB 750 mg/150 mL  Status:  Discontinued        750 mg 150 mL/hr over 60 Minutes Intravenous Every 12 hours 08/12/24 1230 08/14/24 0007   08/12/24 1400  ceFAZolin  (ANCEF ) IVPB 2g/100 mL premix  Status:  Discontinued        2 g 200 mL/hr over 30 Minutes Intravenous Every 8 hours 08/12/24 1149 08/12/24 1230   08/12/24 1400  cefTAZidime  (FORTAZ ) 2 g in sodium chloride  0.9 % 100 mL IVPB  Status:  Discontinued        2 g 200 mL/hr over 30 Minutes Intravenous Every 8 hours 08/12/24 1152 08/13/24 1109   08/12/24 1245  ceFAZolin  (ANCEF ) IVPB 2g/100 mL premix  Status:  Discontinued        2 g 200 mL/hr over 30 Minutes Intravenous  Once 08/12/24 1149 08/12/24 1449   08/12/24 1245  vancomycin  (VANCOREADY) IVPB 1250 mg/250 mL        1,250 mg 166.7 mL/hr over 90 Minutes Intravenous  Once 08/12/24 1152 08/12/24 1501   08/12/24 0957  vancomycin  (VANCOCIN ) powder  Status:  Discontinued          As needed 08/12/24 1008 08/12/24 1037   08/01/24 2200  ceFAZolin  (ANCEF ) IVPB 2g/100 mL premix        2 g 200 mL/hr over 30 Minutes Intravenous Every 8 hours 08/01/24 1703 08/02/24 1915   08/01/24 1530  vancomycin  (VANCOCIN ) powder  Status:   Discontinued          As needed 08/01/24 1559 08/01/24 1606   08/01/24 1421  ceFAZolin  (ANCEF ) 2-4 GM/100ML-% IVPB  Note to Pharmacy: Roslynn Birmingham: cabinet override      08/01/24 1421 08/02/24 0229   07/29/24 2200  ceFAZolin  (ANCEF ) IVPB 2g/100 mL premix        2 g 200 mL/hr over 30 Minutes Intravenous Every 8 hours 07/29/24 1859 07/30/24 1423   07/29/24 1654  vancomycin  (VANCOCIN ) powder  Status:  Discontinued          As needed 07/29/24 1655 07/29/24 1758   07/29/24 1515  ceFAZolin  (ANCEF ) IVPB 2g/100 mL premix        2 g 200 mL/hr over 30 Minutes Intravenous  Once 07/29/24 1421 07/29/24 1655         Component Value Date/Time   SDES WOUND 08/12/2024 0955   SPECREQUEST LEFT 08/12/2024 0955   CULT  08/12/2024 0955    MODERATE METHICILLIN RESISTANT STAPHYLOCOCCUS AUREUS NO ANAEROBES ISOLATED Performed at [facility], [address], [place], [place] [address]    REPTSTATUS 08/17/2024 FINAL 08/12/2024 9044    Procedures: Procedures (LRB): left bone flap removal (N/A)  Mennie LAMY, MD [facility] 11/08/2024, 11:30 AM   "

## 2024-11-08 NOTE — Progress Notes (Signed)
 SLP Cancellation Note  Patient Details Name: Ernest Romero MRN: [id number] DOB: 1966/04/15   Cancelled treatment:    Pt, support counselor from outside hospital and SW involved in discussion.  Will return at another time.      Aniruddh Ciavarella L. Vona, MA CCC/SLP Clinical Specialist - Acute Care SLP Acute Rehabilitation Services Office number [phone]    [name] 11/08/2024, 2:03 PM
